# Patient Record
Sex: Female | Born: 1945 | Race: Black or African American | Hispanic: No | Marital: Single | Smoking: Former smoker
Health system: Southern US, Community
[De-identification: ages and names within clinical notes are randomized; demographics above are authoritative.]

## PROBLEM LIST (undated history)

## (undated) DIAGNOSIS — N189 Chronic kidney disease, unspecified: Secondary | ICD-10-CM

## (undated) DIAGNOSIS — E119 Type 2 diabetes mellitus without complications: Secondary | ICD-10-CM

## (undated) DIAGNOSIS — E785 Hyperlipidemia, unspecified: Secondary | ICD-10-CM

## (undated) DIAGNOSIS — I10 Essential (primary) hypertension: Secondary | ICD-10-CM

## (undated) HISTORY — DX: Chronic kidney disease, unspecified: N18.9

## (undated) HISTORY — PX: EYE SURGERY: SHX253

## (undated) HISTORY — PX: TUBAL LIGATION: SHX77

---

## 2009-10-26 HISTORY — PX: BREAST CYST EXCISION: SHX579

## 2012-08-16 ENCOUNTER — Ambulatory Visit: Payer: Self-pay | Admitting: Ophthalmology

## 2012-08-24 ENCOUNTER — Ambulatory Visit: Payer: Self-pay | Admitting: Ophthalmology

## 2013-10-04 ENCOUNTER — Ambulatory Visit: Payer: Self-pay | Admitting: Ophthalmology

## 2015-02-12 NOTE — Op Note (Signed)
PATIENT NAME:  Amy [name], Amy [name] MR#:  [id number] DATE OF BIRTH:  Nov 17, 1945  DATE OF PROCEDURE:  08/24/2012  PROCEDURES PERFORMED:  1. Pars plana vitrectomy of the right eye.  2. Endolaser, right eye.  3. Membrane peel, right eye.  4. Cataract extraction and intraocular lens insertion, right eye.   PREOPERATIVE DIAGNOSES:  1. Dense vitreous hemorrhage.  2. Diabetes.  3. 3+ visually significant cataract.   POSTOPERATIVE DIAGNOSES:  1. Dense vitreous hemorrhage.  2. Diabetes.  3. 3+ visually significant cataract.  4. Epiretinal membrane.  5. Retinal edema.   ANESTHESIA: Retrobulbar block of the right eye with monitored anesthesia care.   COMPLICATIONS: None.   ESTIMATED BLOOD LOSS: Less than 1 mL.   PRIMARY SURGEON: Teresa Pelton. Sirinity Outland, MD  INDICATIONS FOR PROCEDURE: This is a patient who presented to my office with sudden loss of vision in the right eye. Examination revealed a dense brown visually significant cataract anteriorly and vitreous hemorrhaging with no view of the posterior pole. Risks, benefits, and alternatives of the above procedure were discussed and the patient wished to proceed.   DETAILS OF PROCEDURE: After informed consent was obtained, the patient was brought to the operative suite at [facility]. Patient was placed in supine position, was given a small dose of propofol and a retrobulbar block was performed on the right eye by the primary surgeon without any complications. The right eye was prepped and draped in sterile manner. After lid speculum was inserted, a side-port wound was created at approximately 10:30 in a clear corneal fashion. DisCoVisc was injected into the anterior chamber then a main corneal wound was created at 12:00. Vision blue was then used to paint the anterior capsule and then rinsed using BSS. DisCoVisc was reinjected into the anterior chamber and a continuous 360 degree anterior capsulorrhexis was created without  complication. The lens was hydrodissected using BSS on a 26 gauge cannula. Lens was rotated for 180 degrees. The lens was then cracked in half. It was unable to be cracked into four more pieces. Each half was put on suction and slowly delivered into the anterior chamber and removed in a safe fashion. Once this was completed cortical material was removed using INA. The posterior capsule was polished. The capsular bag was inflated using DisCoVisc. A Technis ZCB00 22.5-diopter lens, serial [id number], was injected into the capsular bag and rotated into position without any complications. The DisCoVisc was removed using INA. A 10-0 nylon stitch was placed at the main corneal wound and the knot was rotated into the cornea. The side-port wound was hydrated and the wounds were noted to be watertight. Attention was then turned to the pars plana vitrectomy portion of the case.   A 23-gauge trocar was placed 3 mm beyond the limbus in the inferotemporal quadrant through displaced conjunctiva in an oblique fashion. The infusion cannula was turned on and inserted through the trocar and secured in position with Steri-Strips. Two more trocars were placed in a similar fashion superotemporally and superonasally. Vitreous cutter and light pipe were introduced in the eye and a core vitrectomy was performed. The posterior vitreous was noted to already be elevated off of the posterior pole. The vitreous was then trimmed for 360 degrees. An  epiretinal membrane associated with proliferative membranes was noted posteriorly. The epiretinal membrane was elevated and peeled off of the macula thus isolating each of the proliferative membranes. These were removed as much as possible remaining as safe as possible. Any bleeding proliferative stubs  were gently applied to endocautery to stop any bleeding without any breaks in the retina. Endolaser was introduced and complete panretinal photocoagulation was performed for 360 degrees from the  temporal arcades out to the ora serrata. Scleral depression was performed for 360 degrees and no breaks or tears could be identified. Inferior blood in the vitreous base was removed using scleral direct visualization and scleral depression. Once this was completed an air-fluid exchange was performed. The trocars were removed and two of the sclerotomies were closed using transconjunctival 6-0 plain gut. Anterior chamber was noted to be well formed and pressure in the eye was confirmed to be approximately 15 mmHg. 5 mg of dexamethasone was given into the inferior fornix and the lid speculum was removed. The eye was cleaned and TobraDex was placed on the eye.    A patch and shield were placed over the eye and the patient was taken to postanesthesia care with instructions to remain head up.   ____________________________ Teresa Pelton. Creta Dorame, MD mfa:cms D: 08/24/2012 08:56:57 ET T: 08/24/2012 10:09:03 ET JOB#: [id number]  cc: Teresa Pelton. Starling Manns, MD, <Dictator> Coralee Rud MD ELECTRONICALLY SIGNED 09/28/2012 6:55

## 2015-02-15 NOTE — Op Note (Signed)
PATIENT NAME:  Amy [name], EVALYN EIGSTI MR#:  [id number] DATE OF BIRTH:  10-07-1946  DATE OF PROCEDURE:  10/04/2013  PROCEDURES PERFORMED:  1.  Pars plana vitrectomy of the left eye.  2.  Epiretinal membrane peel of the left eye.  3.  Panretinal photocoagulation of the left eye.  4.  Phacoemulsification with intraocular lens insertion of the left eye.   PREOPERATIVE DIAGNOSES:  1.  Epiretinal membrane.  2.  Retinal edema.  3.  Proliferative diabetic retinopathy.  4.  Vitreous hemorrhage.  5.  Visually significant cataract of the left eye.   POSTOPERATIVE DIAGNOSES: 1.  Epiretinal membrane.  2.  Retinal edema.  3.  Proliferative diabetic retinopathy.  4.  Vitreous hemorrhage.  5.  Visually significant cataract of the left eye.   ESTIMATED BLOOD LOSS: Less than 1 mL.  PRIMARY SURGEON: Coralee Rud, M.D.   ANESTHESIA: Retrobulbar block of the left eye with monitored sedation care.   INDICATIONS FOR PROCEDURE: The patient presented to my office with decreasing vision in the left eye. The patient noted that she has significant trouble with reading and watching television. Visual acuity was 2400 in the left eye. Examination revealed an epiretinal membrane associated with proliferative diabetic retinopathy and retinal edema. A visually significant cataract was also noted as well as vitreous hemorrhage inferiorly. Risks, benefits, and alternatives of the above procedure were discussed and the patient wished to proceed.   DETAILS OF PROCEDURE: After informed consent was obtained, the patient was brought into the operative suite at [facility]. The patient was placed in supine position, given a small dose of Alfenta and a retrobulbar block was performed on the left eye by the primary surgeon without any complications. The left eye was prepped and draped in sterile manner. After lid speculum was inserted, a side-port wound was created at approximately 10:30 in a clear  corneal fashion. DisCoVisc was injected into the anterior chamber. A main corneal wound was created at 12-o'clock. A cystotome was introduced and a continuous 360 degree anterior capsulorrhexis was created. The lens was hydrodissected using BSS on a 26-gauge cannula. The lens was broken into 3 pieces and removed. A small posterior capsular hole was noted. DisCoVisc was injected into the capsular bag and the cortical material was carefully removed. No vitreous was noted to come around. DisCoVisc was used to re-inflate the bag and a 22.5-diopter Tecnis, ZCB00 lens, serial [id number] was injected into the capsular bag and rotated into position. The anterior DisCoVisc was removed and a 10-0 stitch was placed at the main corneal wound and tied. The knot was rotated into the cornea and the side-port wound was hydrated. The wounds were noted to be watertight.  A 25-gauge trocar was placed inferotemporally through displaced conjunctiva in an oblique fashion 3 mm beyond the limbus. Infusion cannula was turned on and inserted through the trocar and secured in position with Steri-Strips. Two more trocars were placed in a similar fashion superotemporally and superonasally. The vitreous cutter and light pipe were introduced into the eye and an anterior vitrectomy was performed just behind the hole in the capsular bag in order to ensure that no vitreous was within the bag. A small amount of vitreous strands was noted to be just inside the capsular bag and this quickly removed with the use of the vitrector. The integrity of the bag was noted to be unchanged and attention was turned to the posterior.   The central vitreous was removed. Posterior vitreous was elevated using  suction with care to isolate each of the proliferative membranes to avoid traction on the retina. Once these were isolated from the anterior vitreous and the rest of the vitreous face, the vitreous was removed for 360 degrees out to the ora serrata. A  combination of horizontal scissors, forceps and vitrector were used to isolate and peel away the epiretinal and proliferative membranes on the posterior pole. The macula was noted to visibly unfold after successful peeling. No breaks or tears could be identified. Two small areas of bleeding were cauterized to avoid postoperative bleeding. Panretinal photocoagulation was introduced for 360 degrees. No signs of any breaks, tears, or retinal detachment could be found peripherally on scleral depressed exam for 360 degrees. A partial air-fluid exchange was performed. The trocars were removed and the wounds were noted to be airtight. Pressure in the eye was confirmed to be approximately 15 mmHg. Dexamethasone  5 mg was given into the inferior fornix and the lid speculum was removed. The eye was cleaned and TobraDex was placed in the eye. A patch and shield were placed over the eye and the patient was taken to postanesthesia care with instruction to remain head up.    ____________________________ Teresa Pelton. Starling Manns, MD mfa:aw D: 10/04/2013 08:56:34 ET T: 10/04/2013 09:12:34 ET JOB#: [id number]  cc: Teresa Pelton. Starling Manns, MD, <Dictator> Coralee Rud MD ELECTRONICALLY SIGNED 10/25/2013 7:16

## 2016-01-29 DIAGNOSIS — D638 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: Secondary | ICD-10-CM | POA: Insufficient documentation

## 2016-04-15 DIAGNOSIS — R809 Proteinuria, unspecified: Secondary | ICD-10-CM | POA: Insufficient documentation

## 2016-04-15 DIAGNOSIS — E1140 Type 2 diabetes mellitus with diabetic neuropathy, unspecified: Secondary | ICD-10-CM | POA: Insufficient documentation

## 2016-04-15 DIAGNOSIS — E1129 Type 2 diabetes mellitus with other diabetic kidney complication: Secondary | ICD-10-CM | POA: Insufficient documentation

## 2016-04-15 DIAGNOSIS — R319 Hematuria, unspecified: Secondary | ICD-10-CM | POA: Insufficient documentation

## 2016-06-24 ENCOUNTER — Other Ambulatory Visit: Payer: Self-pay | Admitting: Family Medicine

## 2016-06-24 DIAGNOSIS — Z1231 Encounter for screening mammogram for malignant neoplasm of breast: Secondary | ICD-10-CM

## 2016-07-01 ENCOUNTER — Encounter: Payer: Self-pay | Admitting: Radiology

## 2016-07-01 ENCOUNTER — Ambulatory Visit
Admission: RE | Admit: 2016-07-01 | Discharge: 2016-07-01 | Disposition: A | Discharge status: Home / Self Care | Payer: Medicare HMO | Source: Ambulatory Visit | Attending: Family Medicine | Admitting: Family Medicine

## 2016-07-01 DIAGNOSIS — Z1231 Encounter for screening mammogram for malignant neoplasm of breast: Secondary | ICD-10-CM | POA: Insufficient documentation

## 2016-07-09 ENCOUNTER — Other Ambulatory Visit: Payer: Self-pay | Admitting: Family Medicine

## 2016-07-09 DIAGNOSIS — N6489 Other specified disorders of breast: Secondary | ICD-10-CM

## 2016-07-21 ENCOUNTER — Ambulatory Visit
Admission: RE | Admit: 2016-07-21 | Discharge: 2016-07-21 | Disposition: A | Discharge status: Home / Self Care | Payer: Medicare HMO | Source: Ambulatory Visit | Attending: Family Medicine | Admitting: Family Medicine

## 2016-07-21 DIAGNOSIS — N6489 Other specified disorders of breast: Secondary | ICD-10-CM

## 2017-01-24 ENCOUNTER — Emergency Department
Admission: EM | Admit: 2017-01-24 | Discharge: 2017-01-24 | Disposition: A | Discharge status: Home / Self Care | Payer: Medicare HMO | Attending: Emergency Medicine | Admitting: Emergency Medicine

## 2017-01-24 ENCOUNTER — Emergency Department: Payer: Medicare HMO

## 2017-01-24 ENCOUNTER — Encounter: Payer: Self-pay | Admitting: Emergency Medicine

## 2017-01-24 DIAGNOSIS — I10 Essential (primary) hypertension: Secondary | ICD-10-CM | POA: Insufficient documentation

## 2017-01-24 DIAGNOSIS — E119 Type 2 diabetes mellitus without complications: Secondary | ICD-10-CM | POA: Insufficient documentation

## 2017-01-24 DIAGNOSIS — R51 Headache: Secondary | ICD-10-CM | POA: Insufficient documentation

## 2017-01-24 DIAGNOSIS — R519 Headache, unspecified: Secondary | ICD-10-CM

## 2017-01-24 HISTORY — DX: Essential (primary) hypertension: I10

## 2017-01-24 HISTORY — DX: Type 2 diabetes mellitus without complications: E11.9

## 2017-01-24 MED ORDER — ACETAMINOPHEN 500 MG PO TABS
1000.0000 mg | ORAL_TABLET | Freq: Once | ORAL | Status: AC
  Administered 2017-01-24: 1000 mg via ORAL
  Filled 2017-01-24: qty 2

## 2017-01-24 MED ORDER — METOPROLOL SUCCINATE ER 50 MG PO TB24
50.0000 mg | ORAL_TABLET | Freq: Once | ORAL | Status: AC
  Administered 2017-01-24: 50 mg via ORAL
  Filled 2017-01-24 (×2): qty 1

## 2017-01-24 NOTE — ED Triage Notes (Signed)
Pt reports right sided head pain x3 days, reports pain is in one spot. Pt A/O, neuro intact, grip strength equal, smile symmetrical.

## 2017-01-24 NOTE — Discharge Instructions (Signed)
You have been seen in the Emergency Department (ED) for a headache. Your evaluation today was overall reassuring. Headaches have many possible causes. Most headaches aren't a sign of a more serious problem, and they will get better on their own.   Follow-up with your doctor in 12-24 hours if you are still having a headache. Otherwise follow up with your doctor in 3-5 days.  For pain take 1000mg  tylenol every 8 hours as needed  When should you call for help?  Call 911 or return to the ED anytime you think you may need emergency care. For example, call if:  You have signs of a stroke. These may include:  Sudden numbness, paralysis, or weakness in your face, arm, or leg, especially on only one side of your body.  Sudden vision changes.  Sudden trouble speaking.  Sudden confusion or trouble understanding simple statements.  Sudden problems with walking or balance.  A sudden, severe headache that is different from past headaches. You have new or worsening headache Nausea and vomiting associated with your headache Fever, neck stiffness associated with your headache  Call your doctor now or seek immediate medical care if:  You have a new or worse headache.  Your headache gets much worse.  How can you care for yourself at home?  Do not drive if you have taken a prescription pain medicine.  Rest in a quiet, dark room until your headache is gone. Close your eyes and try to relax or go to sleep. Don't watch TV or read.  Put a cold, moist cloth or cold pack on the painful area for 10 to 20 minutes at a time. Put a thin cloth between the cold pack and your skin.  Use a warm, moist towel or a heating pad set on low to relax tight shoulder and neck muscles.  Have someone gently massage your neck and shoulders.  Take pain medicines exactly as directed.  If the doctor gave you a prescription medicine for pain, take it as prescribed.  If you are not taking a prescription pain medicine, ask your doctor  if you can take an over-the-counter medicine. Be careful not to take pain medicine more often than the instructions allow, because you may get worse or more frequent headaches when the medicine wears off.  Do not ignore new symptoms that occur with a headache, such as a fever, weakness or numbness, vision changes, or confusion. These may be signs of a more serious problem.  To prevent headaches  Keep a headache diary so you can figure out what triggers your headaches. Avoiding triggers may help you prevent headaches. Record when each headache began, how long it lasted, and what the pain was like (throbbing, aching, stabbing, or dull). Write down any other symptoms you had with the headache, such as nausea, flashing lights or dark spots, or sensitivity to bright light or loud noise. Note if the headache occurred near your period. List anything that might have triggered the headache, such as certain foods (chocolate, cheese, wine) or odors, smoke, bright light, stress, or lack of sleep.  Find healthy ways to deal with stress. Headaches are most common during or right after stressful times. Take time to relax before and after you do something that has caused a headache in the past.  Try to keep your muscles relaxed by keeping good posture. Check your jaw, face, neck, and shoulder muscles for tension, and try relaxing them. When sitting at a desk, change positions often, and stretch for  30 seconds each hour.  Get plenty of sleep and exercise.  Eat regularly and well. Long periods without food can trigger a headache.  Treat yourself to a massage. Some people find that regular massages are very helpful in relieving tension.  Limit caffeine by not drinking too much coffee, tea, or soda. But don't quit caffeine suddenly, because that can also give you headaches.  Reduce eyestrain from computers by blinking frequently and looking away from the computer screen every so often. Make sure you have proper eyewear and  that your monitor is set up properly, about an arm's length away.  Seek help if you have depression or anxiety. Your headaches may be linked to these conditions. Treatment can both prevent headaches and help with symptoms of anxiety or depression.

## 2017-01-24 NOTE — ED Provider Notes (Signed)
[facility] Emergency Department Provider Note  ____________________________________________  Time seen: Approximately 7:50 PM  I have reviewed the triage vital signs and the nursing notes.   HISTORY  Chief Complaint Headache   HPI Amy [name] is a 71 y.o. female with a history of diabetes and hypertension who presents for evaluation of headache. Patient reports 2 days of intermittent right-sided headache that she describes as a severe sharp stabbing pain in her right temporal area that lasts a few seconds at a time and resolves without intervention. These episodes have been happening more frequent today and more severe which prompted her visit to the emergency. She has no headache at this time. No changes in vision, no slurred speech, no unilateral weakness or numbness, no difficulty walking, no lightheadedness. No nausea or vomiting, no fever or neck stiffness, no jaw claudication.  Past Medical History:  Diagnosis Date  . Diabetes mellitus without complication ([place])   . Hypertension     There are no active problems to display for this patient.   Past Surgical History:  Procedure Laterality Date  . BREAST CYST EXCISION Right 2011   cyst removed -neg    Prior to Admission medications   Not on File    Allergies Patient has no known allergies.  Family History  Problem Relation Age of Onset  . Breast cancer Neg Hx     Social History Social History  Substance Use Topics  . Smoking status: Not on file  . Smokeless tobacco: Not on file  . Alcohol use Not on file    Review of Systems  Constitutional: Negative for fever. Eyes: Negative for visual changes. ENT: Negative for sore throat. Neck: No neck pain  Cardiovascular: Negative for chest pain. Respiratory: Negative for shortness of breath. Gastrointestinal: Negative for abdominal pain, vomiting or diarrhea. Genitourinary: Negative for dysuria. Musculoskeletal: Negative for back  pain. Skin: Negative for rash. Neurological: Negative for weakness or numbness. + HA Psych: No SI or HI  ____________________________________________   PHYSICAL EXAM:  VITAL SIGNS: ED Triage Vitals [01/24/17 1659]  Enc Vitals Group     BP (!) 159/72     Pulse Rate 77     Resp 16     Temp 98.4 F (36.9 C)     Temp Source Oral     SpO2 100 %     Weight 160 lb (72.6 kg)     Height 5\' 7"  (1.702 m)     Head Circumference      Peak Flow      Pain Score 6     Pain Loc      Pain Edu?      Excl. in [place]?     Constitutional: Alert and oriented. Well appearing and in no apparent distress. HEENT:      Head: Normocephalic and atraumatic.         Eyes: Conjunctivae are normal. Sclera is non-icteric. EOMI. PERRL      Mouth/Throat: Mucous membranes are moist.       Neck: Supple with no signs of meningismus. NO ttp over the temporal artery Cardiovascular: Regular rate and rhythm. No murmurs, gallops, or rubs. 2+ symmetrical distal pulses are present in all extremities. No JVD. Respiratory: Normal respiratory effort. Lungs are clear to auscultation bilaterally. No wheezes, crackles, or rhonchi.  Gastrointestinal: Soft, non tender, and non distended with positive bowel sounds. No rebound or guarding. Genitourinary: No CVA tenderness. Musculoskeletal: Nontender with normal range of motion in all extremities.  No edema, cyanosis, or erythema of extremities. Neurologic: Normal speech and language. A & O x3, PERRL, no nystagmus, CN II-XII intact, motor testing reveals good tone and bulk throughout. There is no evidence of pronator drift or dysmetria. Muscle strength is 5/5 throughout. Deep tendon reflexes are 2+ throughout with downgoing toes. Sensory examination is intact. Gait is normal. Skin: Skin is warm, dry and intact. No rash noted. Psychiatric: Mood and affect are normal. Speech and behavior are normal.  ____________________________________________   LABS (all labs ordered are listed,  but only abnormal results are displayed)  Labs Reviewed - No data to display ____________________________________________  EKG  none  ____________________________________________  RADIOLOGY  Head CT: No evidence of acute intracranial abnormality. 2. Mild chronic small vessel ischemic disease.  ____________________________________________   PROCEDURES  Procedure(s) performed: None Procedures Critical Care performed:  None ____________________________________________   INITIAL IMPRESSION / ASSESSMENT AND PLAN / ED COURSE  71 y.o. female with a history of diabetes and hypertension who presents for evaluation of 2 days of R sided intermittent headache. No headache at this time. Patient is neurologically intact, she has normal vital signs.  Low suspicion for more serious or life threatening etiology of HA based on history and exam. No sudden onset thunderclap HA, onset with exertion, vomiting, focal neurologic deficits, to suggest increased risk of subarachnoid hemorrhage. No fever, neck pain, neck stiffness, or meningismus on exam to suggest meningitis. No fevers, altered mental status, unusual behavior to suggest encephalitis. No focal neurologic deficits by history or exam to suggest central venous thrombosis. No constitutional symptoms including fever, fatigue, weight loss, temporal scalp tenderness, jaw claudication, visual loss, to suggest temporal arteritis. No immunocompromise to suggest increased risk for intracranial infectious disease. No visual changes or findings on ocular exam to suggest acute angle closure glaucoma. No reports of toxic exposures including carbon monoxide or other household members with similar symptoms.  Will get head CT, give tylenol. Patient's BP is slightly elevated in the setting of not taking her medication this evening. We'll give her metoprolol here.  Clinical Course as of Jan 24 2106  Nancy Fetter Jan 24, 2017  2106 CT negative, Patient remains extremely  well appearing, neuro intact with no HA. Will dc home on supportive care and f/u with PCP.  [CV]    Clinical Course User Index [CV] Rudene Re, MD    Pertinent labs & imaging results that were available during my care of the patient were reviewed by me and considered in my medical decision making (see chart for details).    ____________________________________________   FINAL CLINICAL IMPRESSION(S) / ED DIAGNOSES  Final diagnoses:  Acute nonintractable headache, unspecified headache type      NEW MEDICATIONS STARTED DURING THIS VISIT:  New Prescriptions   No medications on file     Note:  This document was prepared using Dragon voice recognition software and may include unintentional dictation errors.    Rudene Re, MD 01/24/17 2107

## 2017-04-11 DIAGNOSIS — N2581 Secondary hyperparathyroidism of renal origin: Secondary | ICD-10-CM | POA: Insufficient documentation

## 2017-11-11 ENCOUNTER — Other Ambulatory Visit: Payer: Self-pay | Admitting: Family Medicine

## 2017-11-11 DIAGNOSIS — Z780 Asymptomatic menopausal state: Secondary | ICD-10-CM

## 2019-07-20 DIAGNOSIS — E872 Acidosis: Secondary | ICD-10-CM | POA: Insufficient documentation

## 2019-07-20 DIAGNOSIS — E8729 Other acidosis: Secondary | ICD-10-CM | POA: Insufficient documentation

## 2019-07-20 DIAGNOSIS — E875 Hyperkalemia: Secondary | ICD-10-CM | POA: Insufficient documentation

## 2019-07-20 DIAGNOSIS — R112 Nausea with vomiting, unspecified: Secondary | ICD-10-CM | POA: Insufficient documentation

## 2019-07-27 HISTORY — PX: INSERTION OF DIALYSIS CATHETER: SHX1324

## 2019-09-19 DIAGNOSIS — R0602 Shortness of breath: Secondary | ICD-10-CM | POA: Insufficient documentation

## 2019-09-19 DIAGNOSIS — D689 Coagulation defect, unspecified: Secondary | ICD-10-CM | POA: Insufficient documentation

## 2019-09-19 DIAGNOSIS — Z992 Dependence on renal dialysis: Secondary | ICD-10-CM | POA: Insufficient documentation

## 2019-09-19 DIAGNOSIS — N186 End stage renal disease: Secondary | ICD-10-CM | POA: Insufficient documentation

## 2019-09-22 DIAGNOSIS — D509 Iron deficiency anemia, unspecified: Secondary | ICD-10-CM | POA: Insufficient documentation

## 2019-09-25 DIAGNOSIS — E441 Mild protein-calorie malnutrition: Secondary | ICD-10-CM | POA: Insufficient documentation

## 2019-11-27 DIAGNOSIS — T7840XA Allergy, unspecified, initial encounter: Secondary | ICD-10-CM | POA: Insufficient documentation

## 2019-11-27 DIAGNOSIS — T782XXA Anaphylactic shock, unspecified, initial encounter: Secondary | ICD-10-CM | POA: Insufficient documentation

## 2019-12-07 DIAGNOSIS — R770 Abnormality of albumin: Secondary | ICD-10-CM | POA: Insufficient documentation

## 2020-01-15 ENCOUNTER — Other Ambulatory Visit: Payer: Self-pay

## 2020-01-16 ENCOUNTER — Ambulatory Visit (INDEPENDENT_AMBULATORY_CARE_PROVIDER_SITE_OTHER): Payer: Medicare HMO | Admitting: Family Medicine

## 2020-01-16 ENCOUNTER — Encounter: Payer: Self-pay | Admitting: Family Medicine

## 2020-01-16 VITALS — BP 138/72 | HR 80 | Temp 97.5°F | Ht 65.0 in | Wt 160.0 lb

## 2020-01-16 DIAGNOSIS — G5603 Carpal tunnel syndrome, bilateral upper limbs: Secondary | ICD-10-CM | POA: Diagnosis not present

## 2020-01-16 DIAGNOSIS — N185 Chronic kidney disease, stage 5: Secondary | ICD-10-CM | POA: Diagnosis not present

## 2020-01-16 DIAGNOSIS — Z992 Dependence on renal dialysis: Secondary | ICD-10-CM | POA: Insufficient documentation

## 2020-01-16 DIAGNOSIS — I10 Essential (primary) hypertension: Secondary | ICD-10-CM | POA: Insufficient documentation

## 2020-01-16 DIAGNOSIS — N186 End stage renal disease: Secondary | ICD-10-CM | POA: Insufficient documentation

## 2020-01-16 NOTE — Progress Notes (Signed)
New Patient Office Visit  Subjective:  Patient ID: Amy [name], female    DOB: [birth year]  Age: 74 y.o. MRN: [id number]  CC:  Chief Complaint  Patient presents with  . Establish Care    New patient, c/o hands feel numb x 5 months becoming worse.     HPI Amy [name] presents for establishment of care.  She is on chronic dialysis since November of this past year for stage V chronic kidney disease.  History of diabetes in her past.  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  History of hypertension that appears to be well controlled with her current therapy of metoprolol and amlodipine.  She is here today complaining of paresthesias in both of her hands.  Right is affected more so than the left.  She is right-hand dominant.  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  Fingers can also be stiff.  There is also drawing in her fingers during dialysis.  She denies neck pain or stiffness in her neck.  Numbness in her fingers wakes her up on occasion.   Past Medical History:  Diagnosis Date  . Diabetes mellitus without complication ([place])   . Hypertension     Past Surgical History:  Procedure Laterality Date  . BREAST CYST EXCISION Right 2011   cyst removed -neg    Family History  Problem Relation Age of Onset  . Breast cancer Neg Hx     Social History   Socioeconomic History  . Marital status: Single    Spouse name: Not on file  . Number of children: Not on file  . Years of education: Not on file  . Highest education level: Not on file  Occupational History  . Not on file  Tobacco Use  . Smoking status: Never Smoker  . Smokeless tobacco: Never Used  Substance and Sexual Activity  . Alcohol use: Never  . Drug use: Never  . Sexual activity: Not on file  Other Topics Concern  . Not on file  Social History Narrative  . Not on file   Social Determinants of Health   Financial Resource Strain:   . Difficulty of Paying  Living Expenses:   Food Insecurity:   . Worried About Charity fundraiser in the Last Year:   . Arboriculturist in the Last Year:   Transportation Needs:   . Film/video editor (Medical):   [name] Lack of Transportation (Non-Medical):   Physical Activity:   . Days of Exercise per Week:   . Minutes of Exercise per Session:   Stress:   . Feeling of Stress :   Social Connections:   . Frequency of Communication with Friends and Family:   . Frequency of Social Gatherings with Friends and Family:   . Attends Religious Services:   . Active Member of Clubs or Organizations:   . Attends Archivist Meetings:   [name] Marital Status:   Intimate Partner Violence:   . Fear of Current or Ex-Partner:   . Emotionally Abused:   [name] Physically Abused:   . Sexually Abused:     ROS Review of Systems  Constitutional: Negative.   HENT: Negative.   Eyes: Negative for photophobia and visual disturbance.  Respiratory: Negative.   Cardiovascular: Negative for palpitations.  Musculoskeletal: Positive for arthralgias. Negative for neck pain and neck stiffness.  Neurological: Positive for numbness. Negative for weakness.  Psychiatric/Behavioral: Negative.     Objective:  Today's Vitals: BP 138/72   Pulse 80   Temp (!) 97.5 F (36.4 C) (Tympanic)   Ht 5\' 5"  (1.651 m)   Wt 160 lb (72.6 kg)   SpO2 98%   BMI 26.63 kg/m   Physical Exam Vitals and nursing note reviewed.  Constitutional:      General: She is not in acute distress.    Appearance: Normal appearance. She is normal weight. She is not ill-appearing, toxic-appearing or diaphoretic.  HENT:     Head: Normocephalic and atraumatic.     Right Ear: External ear normal.     Left Ear: External ear normal.  Eyes:     General:        Right eye: No discharge.        Left eye: No discharge.     Conjunctiva/sclera: Conjunctivae normal.  Cardiovascular:     Rate and Rhythm: Normal rate and regular rhythm.  Pulmonary:     Effort:  Pulmonary effort is normal.     Breath sounds: Normal breath sounds.  Musculoskeletal:     Right hand: No swelling, deformity, lacerations, tenderness or bony tenderness. Decreased range of motion. Normal strength.     Left hand: No swelling, deformity, lacerations, tenderness or bony tenderness. Decreased range of motion. Normal strength.       Arms:     Cervical back: Normal. No swelling, deformity, signs of trauma, rigidity, spasms or tenderness. Normal range of motion.  Lymphadenopathy:     Cervical: No cervical adenopathy.  Neurological:     Mental Status: She is alert.     Assessment & Plan:   Problem List Items Addressed This Visit      Cardiovascular and Mediastinum   Essential hypertension   Relevant Medications   amLODipine (NORVASC) 5 MG tablet   metoprolol succinate (TOPROL-XL) 50 MG 24 hr tablet     Nervous and Auditory   Bilateral carpal tunnel syndrome   Relevant Orders   Ambulatory referral to Sports Medicine     Genitourinary   Stage 5 chronic kidney disease not on chronic dialysis [facility]) - Primary      Outpatient Encounter Medications as of 01/16/2020  Medication Sig  . amLODipine (NORVASC) 5 MG tablet Take 1 tablet by mouth daily.  . calcium acetate (PHOSLO) 667 MG capsule Take 1,334 mg by mouth 3 (three) times daily.  . metoprolol succinate (TOPROL-XL) 50 MG 24 hr tablet Take 50 mg by mouth daily.   No facility-administered encounter medications on file as of 01/16/2020.    Follow-up: Return in about 3 months (around 04/17/2020).   [name], MD

## 2020-01-23 ENCOUNTER — Other Ambulatory Visit: Payer: Self-pay

## 2020-01-23 ENCOUNTER — Ambulatory Visit: Payer: Medicare HMO | Admitting: Family Medicine

## 2020-01-23 VITALS — BP 130/82 | Ht 65.0 in | Wt 160.0 lb

## 2020-01-23 DIAGNOSIS — M25562 Pain in left knee: Secondary | ICD-10-CM

## 2020-01-23 DIAGNOSIS — G5603 Carpal tunnel syndrome, bilateral upper limbs: Secondary | ICD-10-CM

## 2020-01-23 DIAGNOSIS — M25561 Pain in right knee: Secondary | ICD-10-CM | POA: Diagnosis not present

## 2020-01-23 DIAGNOSIS — G8929 Other chronic pain: Secondary | ICD-10-CM | POA: Diagnosis not present

## 2020-01-23 NOTE — Patient Instructions (Signed)
You have carpal tunnel syndrome. Wear the wrist brace at nighttime and as often as possible during the day Tylenol 500mg  up to three times a day as needed for pain. Consider topical voltaren gel up to 4 times a day for pain and inflammation. Avoid aleve and ibuprofen. If not improving, will consider nerve conduction studies to assess severity. Follow up with me in 6 weeks.  Your knee pain is due to arthritis. These are the different medications you can take for this: Tylenol 500mg  three times a day for pain. Capsaicin, aspercreme, or biofreeze topically up to four times a day may also help with pain. Some supplements that may help for arthritis: Boswellia extract, curcumin, pycnogenol Cortisone injections are an option if pain is severe. If cortisone injections do not help, there are different types of shots that may help but they take longer to take effect. It's important that you continue to stay active. Straight leg raises, knee extensions 3 sets of 10 once a day (add ankle weight if these become too easy). Consider physical therapy to strengthen muscles around the joint that hurts to take pressure off of the joint itself. Shoe inserts with good arch support may be helpful. Heat or ice 15 minutes at a time 3-4 times a day as needed to help with pain. Water aerobics and cycling with low resistance are the best two types of exercise for arthritis though any exercise is ok as long as it doesn't worsen the pain. Follow up with me in 6 weeks.

## 2020-01-23 NOTE — Progress Notes (Signed)
PCP and consultation requested by: [name], MD  Subjective:   HPI: Patient is a 74 y.o. female with PMH significant for CKD stage 5 on dialysis for 5 months here for bilateral hand numbness/pain and bilateral knee pain.  She has a 6 mo hx of intermittent bilateral hand numbness/pain during the day and at night, while sleeping on her side. Sx are present in all digits of both hands and says that she notices it most when sleeping on her right shoulder and has to adjust her position. She denies weakness or difficulty performing daily tasks. She denies neck pain or tightness, denies hx of cervical radiculopathy.  She also complains of 8 mo hx of bilateral knee pain and leg weakness that is evident when climbing stairs. The pain is diffuse throughout both knees. She endorses difficulty with standing off the toilet and getting in/out of the tub. She has noticed cracking in her knees while climbing stairs. She denies falling, or feeling unsteady in the knee joints.  Of note, she does receive Iron and Vit D with dialysis.  Past Medical History:  Diagnosis Date  . Diabetes mellitus without complication ([place])   . Hypertension     Current Outpatient Medications on File Prior to Visit  Medication Sig Dispense Refill  . amLODipine (NORVASC) 5 MG tablet Take 1 tablet by mouth daily.    . calcium acetate (PHOSLO) 667 MG capsule Take 1,334 mg by mouth 3 (three) times daily.    . metoprolol succinate (TOPROL-XL) 50 MG 24 hr tablet Take 50 mg by mouth daily.     No current facility-administered medications on file prior to visit.    Past Surgical History:  Procedure Laterality Date  . BREAST CYST EXCISION Right 2011   cyst removed -neg    No Known Allergies  Social History   Socioeconomic History  . Marital status: Single    Spouse name: Not on file  . Number of children: Not on file  . Years of education: Not on file  . Highest education level: Not on file  Occupational  History  . Not on file  Tobacco Use  . Smoking status: Never Smoker  . Smokeless tobacco: Never Used  Substance and Sexual Activity  . Alcohol use: Never  . Drug use: Never  . Sexual activity: Not on file  Other Topics Concern  . Not on file  Social History Narrative  . Not on file   Social Determinants of Health   Financial Resource Strain:   . Difficulty of Paying Living Expenses:   Food Insecurity:   . Worried About Charity fundraiser in the Last Year:   . Arboriculturist in the Last Year:   Transportation Needs:   . Film/video editor (Medical):   [name] Lack of Transportation (Non-Medical):   Physical Activity:   . Days of Exercise per Week:   . Minutes of Exercise per Session:   Stress:   . Feeling of Stress :   Social Connections:   . Frequency of Communication with Friends and Family:   . Frequency of Social Gatherings with Friends and Family:   . Attends Religious Services:   . Active Member of Clubs or Organizations:   . Attends Archivist Meetings:   [name] Marital Status:   Intimate Partner Violence:   . Fear of Current or Ex-Partner:   . Emotionally Abused:   [name] Physically Abused:   . Sexually Abused:     Family  History  Problem Relation Age of Onset  . Breast cancer Neg Hx     BP 130/82   Ht 5\' 5"  (1.651 m)   Wt 160 lb (72.6 kg)   BMI 26.63 kg/m   Review of Systems: See HPI above.     Objective:  Physical Exam:  Gen: NAD, comfortable in exam room  Right hand/wrist: No masses or effusion, no discoloration. FROM. No TTP. Strength:  Wrist flexion 5/5 Wrist extension 5/5 Finger abduction 5/5 Grip strength 5/5 Tinel sign: positive Phalen's Test: Positive Palpation of cubital tunnel: negative  Left hand/wrist: No masses or effusion, no discoloration. FROM. No TTP. Strength:  Wrist flexion 5/5 Wrist extension 5/5 Finger adduction 5/5 Grip strength 5/5 Tinel sign: positive Phalen's Test: Positive Palpation of cubital tunnel:  negative  Right knee: No masses or effusion, no discoloration. FROM. No TTP. Strength: Flexion 5/5 Extension 5/5 Valgus/Varus stress: Neg Ant drawer/Lachman: Neg Post drawer: Neg McMurray: Neg  Left knee: No masses or effusion, no discoloration. FROM. No TTP. Strength: Flexion 5/5 Extension 5/5 Valgus/Varus stress: Neg Ant drawer/Lachman: Neg Post drawer: Neg McMurray: Neg  U/S: Right median nerve area: 0.12 cm2, Left median nerve area: 0.13 cm2    Assessment & Plan:  1. Bilateral Carpal tunnel syndrome: Increased median nerve area on ultrasound, Positive Tinel's/Phalens tests with symptoms, although the full finger distribution is unusual, cervical radiculopathy is unlikely with her exam. - Cock up wrist braces nightly  - Apply voltaren gel to wrists prn for pain (patient to clear with Kidney doctors) 2. Bilateral knee osteoarthritis - Quadricep/hip flexor strengthening

## 2020-01-24 ENCOUNTER — Encounter: Payer: Self-pay | Admitting: Family Medicine

## 2020-03-06 ENCOUNTER — Ambulatory Visit: Payer: Medicare HMO | Admitting: Family Medicine

## 2020-04-18 ENCOUNTER — Ambulatory Visit: Payer: Medicare HMO | Admitting: Family Medicine

## 2020-05-01 DIAGNOSIS — E872 Acidosis, unspecified: Secondary | ICD-10-CM | POA: Insufficient documentation

## 2020-05-01 DIAGNOSIS — Z4932 Encounter for adequacy testing for peritoneal dialysis: Secondary | ICD-10-CM | POA: Insufficient documentation

## 2020-05-01 DIAGNOSIS — E878 Other disorders of electrolyte and fluid balance, not elsewhere classified: Secondary | ICD-10-CM | POA: Insufficient documentation

## 2020-05-01 DIAGNOSIS — Z79899 Other long term (current) drug therapy: Secondary | ICD-10-CM | POA: Insufficient documentation

## 2020-05-01 DIAGNOSIS — K769 Liver disease, unspecified: Secondary | ICD-10-CM | POA: Insufficient documentation

## 2020-05-01 DIAGNOSIS — N2589 Other disorders resulting from impaired renal tubular function: Secondary | ICD-10-CM | POA: Insufficient documentation

## 2020-05-01 DIAGNOSIS — E789 Disorder of lipoprotein metabolism, unspecified: Secondary | ICD-10-CM | POA: Insufficient documentation

## 2020-05-01 DIAGNOSIS — D513 Other dietary vitamin B12 deficiency anemia: Secondary | ICD-10-CM | POA: Insufficient documentation

## 2020-05-02 ENCOUNTER — Ambulatory Visit (INDEPENDENT_AMBULATORY_CARE_PROVIDER_SITE_OTHER): Payer: Medicare HMO

## 2020-05-02 VITALS — Ht 65.0 in | Wt 160.0 lb

## 2020-05-02 DIAGNOSIS — Z0000 Encounter for general adult medical examination without abnormal findings: Secondary | ICD-10-CM

## 2020-05-02 NOTE — Patient Instructions (Signed)
Amy [name] , Thank you for taking time to come for your Medicare Wellness Visit. I appreciate your ongoing commitment to your health goals. Please review the following plan we discussed and let me know if I can assist you in the future.   Screening recommendations/referrals: Colonoscopy: Declined at this time. Please call the office to schedule if you change your mind. Mammogram: Completed in 2020 in [place] per our discussion. Bone Density: Declined at this time. Call our office to schedule if you change your mind. Recommended yearly ophthalmology/optometry visit for glaucoma screening and checkup Recommended yearly dental visit for hygiene and checkup  Vaccinations: Influenza vaccine: Due-06/2020 Pneumococcal vaccine: Due-May receive at next office visit if you decide you want it. Tdap vaccine: Discuss with pharmacy Shingles vaccine: Discuss with pharmacy   Covid-19:Completed  Advanced directives: Discussed. You may pick up information at next office visit if interested.  Conditions/risks identified: See problem list  Next appointment: Follow up in one year for your annual wellness visit    Preventive Care 65 Years and Older, Female Preventive care refers to lifestyle choices and visits with your health care provider that can promote health and wellness. What does preventive care include?  A yearly physical exam. This is also called an annual well check.  Dental exams once or twice a year.  Routine eye exams. Ask your health care provider how often you should have your eyes checked.  Personal lifestyle choices, including:  Daily care of your teeth and gums.  Regular physical activity.  Eating a healthy diet.  Avoiding tobacco and drug use.  Limiting alcohol use.  Practicing safe sex.  Taking low-dose aspirin every day.  Taking vitamin and mineral supplements as recommended by your health care provider. What happens during an annual well check? The services and  screenings done by your health care provider during your annual well check will depend on your age, overall health, lifestyle risk factors, and family history of disease. Counseling  Your health care provider may ask you questions about your:  Alcohol use.  Tobacco use.  Drug use.  Emotional well-being.  Home and relationship well-being.  Sexual activity.  Eating habits.  History of falls.  Memory and ability to understand (cognition).  Work and work Statistician.  Reproductive health. Screening  You may have the following tests or measurements:  Height, weight, and BMI.  Blood pressure.  Lipid and cholesterol levels. These may be checked every 5 years, or more frequently if you are over 26 years old.  Skin check.  Lung cancer screening. You may have this screening every year starting at age 64 if you have a 30-pack-year history of smoking and currently smoke or have quit within the past 15 years.  Fecal occult blood test (FOBT) of the stool. You may have this test every year starting at age 59.  Flexible sigmoidoscopy or colonoscopy. You may have a sigmoidoscopy every 5 years or a colonoscopy every 10 years starting at age 47.  Hepatitis C blood test.  Hepatitis B blood test.  Sexually transmitted disease (STD) testing.  Diabetes screening. This is done by checking your blood sugar (glucose) after you have not eaten for a while (fasting). You may have this done every 1-3 years.  Bone density scan. This is done to screen for osteoporosis. You may have this done starting at age 47.  Mammogram. This may be done every 1-2 years. Talk to your health care provider about how often you should have regular mammograms. Talk with  your health care provider about your test results, treatment options, and if necessary, the need for more tests. Vaccines  Your health care provider may recommend certain vaccines, such as:  Influenza vaccine. This is recommended every  year.  Tetanus, diphtheria, and acellular pertussis (Tdap, Td) vaccine. You may need a Td booster every 10 years.  Zoster vaccine. You may need this after age 4.  Pneumococcal 13-valent conjugate (PCV13) vaccine. One dose is recommended after age 94.  Pneumococcal polysaccharide (PPSV23) vaccine. One dose is recommended after age 1. Talk to your health care provider about which screenings and vaccines you need and how often you need them. This information is not intended to replace advice given to you by your health care provider. Make sure you discuss any questions you have with your health care provider. Document Released: 11/08/2015 Document Revised: 07/01/2016 Document Reviewed: 08/13/2015 Elsevier Interactive Patient Education  2017 [place] Prevention in the Home Falls can cause injuries. They can happen to people of all ages. There are many things you can do to make your home safe and to help prevent falls. What can I do on the outside of my home?  Regularly fix the edges of walkways and driveways and fix any cracks.  Remove anything that might make you trip as you walk through a door, such as a raised step or threshold.  Trim any bushes or trees on the path to your home.  Use bright outdoor lighting.  Clear any walking paths of anything that might make someone trip, such as rocks or tools.  Regularly check to see if handrails are loose or broken. Make sure that both sides of any steps have handrails.  Any raised decks and porches should have guardrails on the edges.  Have any leaves, snow, or ice cleared regularly.  Use sand or salt on walking paths during winter.  Clean up any spills in your garage right away. This includes oil or grease spills. What can I do in the bathroom?  Use night lights.  Install grab bars by the toilet and in the tub and shower. Do not use towel bars as grab bars.  Use non-skid mats or decals in the tub or shower.  If you  need to sit down in the shower, use a plastic, non-slip stool.  Keep the floor dry. Clean up any water that spills on the floor as soon as it happens.  Remove soap buildup in the tub or shower regularly.  Attach bath mats securely with double-sided non-slip rug tape.  Do not have throw rugs and other things on the floor that can make you trip. What can I do in the bedroom?  Use night lights.  Make sure that you have a light by your bed that is easy to reach.  Do not use any sheets or blankets that are too big for your bed. They should not hang down onto the floor.  Have a firm chair that has side arms. You can use this for support while you get dressed.  Do not have throw rugs and other things on the floor that can make you trip. What can I do in the kitchen?  Clean up any spills right away.  Avoid walking on wet floors.  Keep items that you use a lot in easy-to-reach places.  If you need to reach something above you, use a strong step stool that has a grab bar.  Keep electrical cords out of the way.  Do not  use floor polish or wax that makes floors slippery. If you must use wax, use non-skid floor wax.  Do not have throw rugs and other things on the floor that can make you trip. What can I do with my stairs?  Do not leave any items on the stairs.  Make sure that there are handrails on both sides of the stairs and use them. Fix handrails that are broken or loose. Make sure that handrails are as long as the stairways.  Check any carpeting to make sure that it is firmly attached to the stairs. Fix any carpet that is loose or worn.  Avoid having throw rugs at the top or bottom of the stairs. If you do have throw rugs, attach them to the floor with carpet tape.  Make sure that you have a light switch at the top of the stairs and the bottom of the stairs. If you do not have them, ask someone to add them for you. What else can I do to help prevent falls?  Wear shoes  that:  Do not have high heels.  Have rubber bottoms.  Are comfortable and fit you well.  Are closed at the toe. Do not wear sandals.  If you use a stepladder:  Make sure that it is fully opened. Do not climb a closed stepladder.  Make sure that both sides of the stepladder are locked into place.  Ask someone to hold it for you, if possible.  Clearly mark and make sure that you can see:  Any grab bars or handrails.  First and last steps.  Where the edge of each step is.  Use tools that help you move around (mobility aids) if they are needed. These include:  Canes.  Walkers.  Scooters.  Crutches.  Turn on the lights when you go into a dark area. Replace any light bulbs as soon as they burn out.  Set up your furniture so you have a clear path. Avoid moving your furniture around.  If any of your floors are uneven, fix them.  If there are any pets around you, be aware of where they are.  Review your medicines with your doctor. Some medicines can make you feel dizzy. This can increase your chance of falling. Ask your doctor what other things that you can do to help prevent falls. This information is not intended to replace advice given to you by your health care provider. Make sure you discuss any questions you have with your health care provider. Document Released: 08/08/2009 Document Revised: 03/19/2016 Document Reviewed: 11/16/2014 Elsevier Interactive Patient Education  2017 [employer].

## 2020-05-02 NOTE — Progress Notes (Signed)
Subjective:   Amy [name] is a 74 y.o. female who presents for an Initial Medicare Annual Wellness Visit.  I connected with [name] today by telephone and verified that I am speaking with the correct person using two identifiers. Location patient: home Location provider: work Persons participating in the virtual visit: patient, Marine scientist.    I discussed the limitations, risks, security and privacy concerns of performing an evaluation and management service by telephone and the availability of in person appointments. I also discussed with the patient that there may be a patient responsible charge related to this service. The patient expressed understanding and verbally consented to this telephonic visit.    Interactive audio and video telecommunications were attempted between this provider and patient, however failed, due to patient having technical difficulties OR patient did not have access to video capability.  We continued and completed visit with audio only.  Some vital signs may be absent or patient reported.   Time Spent with patient on telephone encounter: 20 minutes  Review of Systems     Cardiac Risk Factors include: advanced age (>23men, >65 women);hypertension;sedentary lifestyle     Objective:    Today's Vitals   05/02/20 1033  Weight: 160 lb (72.6 kg)  Height: 5\' 5"  (1.651 m)   Body mass index is 26.63 kg/m.  Advanced Directives 05/02/2020 01/24/2017  Does Patient Have a Medical Advance Directive? No No  Would patient like information on creating a medical advance directive? No - Patient declined -    Current Medications (verified) Outpatient Encounter Medications as of 05/02/2020  Medication Sig  . metoprolol succinate (TOPROL-XL) 50 MG 24 hr tablet Take 50 mg by mouth daily.  [name] amLODipine (NORVASC) 5 MG tablet Take 1 tablet by mouth daily. (Patient not taking: Reported on 05/02/2020)  . calcium acetate (PHOSLO) 667 MG capsule Take 1,334 mg by mouth 3 (three) times  daily. (Patient not taking: Reported on 05/02/2020)   No facility-administered encounter medications on file as of 05/02/2020.    Allergies (verified) Patient has no known allergies.   History: Past Medical History:  Diagnosis Date  . Diabetes mellitus without complication ([place])   . Hypertension    Past Surgical History:  Procedure Laterality Date  . BREAST CYST EXCISION Right 2011   cyst removed -neg   Family History  Problem Relation Age of Onset  . Breast cancer Neg Hx    Social History   Socioeconomic History  . Marital status: Single    Spouse name: Not on file  . Number of children: Not on file  . Years of education: Not on file  . Highest education level: Not on file  Occupational History  . Not on file  Tobacco Use  . Smoking status: Never Smoker  . Smokeless tobacco: Never Used  Vaping Use  . Vaping Use: Never used  Substance and Sexual Activity  . Alcohol use: Never  . Drug use: Never  . Sexual activity: Not on file  Other Topics Concern  . Not on file  Social History Narrative  . Not on file   Social Determinants of Health   Financial Resource Strain: Low Risk   . Difficulty of Paying Living Expenses: Not hard at all  Food Insecurity: No Food Insecurity  . Worried About Charity fundraiser in the Last Year: Never true  . Ran Out of Food in the Last Year: Never true  Transportation Needs: No Transportation Needs  . Lack of Transportation (Medical): No  .  Lack of Transportation (Non-Medical): No  Physical Activity: Inactive  . Days of Exercise per Week: 0 days  . Minutes of Exercise per Session: 0 min  Stress: No Stress Concern Present  . Feeling of Stress : Not at all  Social Connections: Moderately Integrated  . Frequency of Communication with Friends and Family: More than three times a week  . Frequency of Social Gatherings with Friends and Family: Never  . Attends Religious Services: More than 4 times per year  . Active Member of Clubs or  Organizations: Yes  . Attends Archivist Meetings: More than 4 times per year  . Marital Status: Never married    Tobacco Counseling Counseling given: Not Answered   Clinical Intake:  Pre-visit preparation completed: Yes  Pain : No/denies pain     Nutritional Status: BMI 25 -29 Overweight Nutritional Risks: None  How often do you need to have someone help you when you read instructions, pamphlets, or other written materials from your doctor or pharmacy?: 1 - Never  Diabetic?No  Interpreter Needed?: No  Information entered by :: Caroleen Hamman LPN   Activities of Daily Living In your present state of health, do you have any difficulty performing the following activities: 05/02/2020  Hearing? N  Vision? Y  Difficulty concentrating or making decisions? N  Walking or climbing stairs? N  Dressing or bathing? N  Doing errands, shopping? N  Preparing Food and eating ? N  Using the Toilet? N  In the past six months, have you accidently leaked urine? N  Do you have problems with loss of bowel control? N  Managing your Medications? N  Managing your Finances? N  Housekeeping or managing your Housekeeping? N  Some recent data might be hidden    Patient Care Team: [name], MD as PCP - General (Family Medicine)  Indicate any recent Medical Services you may have received from other than [facility] providers in the past year (date may be approximate).     Assessment:   This is a routine wellness examination for Amy [name].  Hearing/Vision screen  Hearing Screening   125Hz  250Hz  500Hz  1000Hz  2000Hz  3000Hz  4000Hz  6000Hz  8000Hz   Right ear:           Left ear:           Comments: No issues  Vision Screening Comments: Wears glasses- Last eye exam-2020  Dietary issues and exercise activities discussed: Current Exercise Habits: The patient does not participate in regular exercise at present, Exercise limited by: None identified  Goals    . Patient Stated       [name] ike to eat better & walk more      Depression Screen PHQ 2/9 Scores 05/02/2020  PHQ - 2 Score 0    Fall Risk Fall Risk  05/02/2020 01/16/2020  Falls in the past year? 1 0  Number falls in past yr: 1 -  Injury with Fall? 0 -  Follow up Falls prevention discussed -    Any stairs in or around the home? Yes  If so, are there any without handrails? No  Home free of loose throw rugs in walkways, pet beds, electrical cords, etc? Yes  Adequate lighting in your home to reduce risk of falls? Yes   ASSISTIVE DEVICES UTILIZED TO PREVENT FALLS:  Life alert? No  Use of a cane, walker or w/c? No  Grab bars in the bathroom? Yes  Shower chair or bench in shower? No  Elevated toilet seat or  a handicapped toilet? No   TIMED UP AND GO:  Was the test performed? No . Virtual visit    Cognitive Function:No cognitive impairment noted        Immunizations Immunization History  Administered Date(s) Administered  . Janssen (J&J) SARS-COV-2 Vaccination 02/26/2020    TDAP status: Due, Education has been provided regarding the importance of this vaccine. Advised may receive this vaccine at local pharmacy or Health Dept. Aware to provide a copy of the vaccination record if obtained from local pharmacy or Health Dept. Verbalized acceptance and understanding.   Flu Vaccine status: Up to date   Pneumococcal vaccine status: Declined,  Education has been provided regarding the importance of this vaccine but patient still declined. Advised may receive this vaccine at local pharmacy or Health Dept. Aware to provide a copy of the vaccination record if obtained from local pharmacy or Health Dept. Verbalized acceptance and understanding.    Covid-19 vaccine status: Completed vaccines  Qualifies for Shingles Vaccine? Yes   Zostavax completed No   Shingrix Completed?: No.    Education has been provided regarding the importance of this vaccine. Patient has been advised to call insurance company to  determine out of pocket expense if they have not yet received this vaccine. Advised may also receive vaccine at local pharmacy or Health Dept. Verbalized acceptance and understanding.  Screening Tests Health Maintenance  Topic Date Due  . Hepatitis C Screening  Never done  . URINE MICROALBUMIN  Never done  . DEXA SCAN  Never done  . MAMMOGRAM  07/01/2018  . COLONOSCOPY  01/15/2021 (Originally 04/04/1998)  . TETANUS/TDAP  01/15/2021 (Originally 04/05/1967)  . PNA vac Low Risk Adult (1 of 2 - PCV13) 01/15/2021 (Originally 04/04/2013)  . INFLUENZA VACCINE  05/26/2020  . COVID-19 Vaccine  Completed    Health Maintenance  Health Maintenance Due  Topic Date Due  . Hepatitis C Screening  Never done  . URINE MICROALBUMIN  Never done  . DEXA SCAN  Never done  . MAMMOGRAM  07/01/2018    Colorectal Cancer Screening: Patient declined  Mammogram status: Completed 2020. Repeat every year Patient states she had done sometime in 2020 in [place]  Bone Density Status: Patient declined  Lung Cancer Screening: (Low Dose CT Chest recommended if Age 54-80 years, 30 pack-year currently smoking OR have quit w/in 15years.) does not qualify.     Additional Screening:  Hepatitis C Screening: does qualify; Discuss with PCP  Vision Screening: Recommended annual ophthalmology exams for early detection of glaucoma and other disorders of the eye. Is the patient up to date with their annual eye exam?  No  Who is the provider or what is the name of the office in which the patient attends annual eye exams? unsure   Dental Screening: Recommended annual dental exams for proper oral hygiene  Community Resource Referral / Chronic Care Management: CRR required this visit?  No   CCM required this visit?  No      Plan:     I have personally reviewed and noted the following in the patient's chart:   . Medical and social history . Use of alcohol, tobacco or illicit drugs  . Current medications and  supplements . Functional ability and status . Nutritional status . Physical activity . Advanced directives . List of other physicians . Hospitalizations, surgeries, and ER visits in previous 12 months . Vitals . Screenings to include cognitive, depression, and falls . Referrals and appointments  In addition, I have  reviewed and discussed with patient certain preventive protocols, quality metrics, and best practice recommendations. A written personalized care plan for preventive services as well as general preventive health recommendations were provided to patient.  Due to this being a telephonic visit, the after visit summary with patients personalized plan was offered to patient via mail or my-chart.  Per request, patient was mailed a copy of [place], LPN   01/25/2819  Nurse Health Advisor  Nurse Notes: None

## 2020-05-06 ENCOUNTER — Other Ambulatory Visit: Payer: Self-pay

## 2020-05-07 ENCOUNTER — Ambulatory Visit: Payer: Medicare HMO | Admitting: Family Medicine

## 2020-05-08 ENCOUNTER — Other Ambulatory Visit: Payer: Self-pay

## 2020-05-09 ENCOUNTER — Ambulatory Visit (INDEPENDENT_AMBULATORY_CARE_PROVIDER_SITE_OTHER): Payer: Medicare HMO | Admitting: Family Medicine

## 2020-05-09 ENCOUNTER — Encounter: Payer: Self-pay | Admitting: Family Medicine

## 2020-05-09 VITALS — BP 138/76 | HR 79 | Temp 97.0°F | Ht 65.0 in | Wt 164.4 lb

## 2020-05-09 DIAGNOSIS — K5901 Slow transit constipation: Secondary | ICD-10-CM | POA: Diagnosis not present

## 2020-05-09 DIAGNOSIS — I10 Essential (primary) hypertension: Secondary | ICD-10-CM

## 2020-05-09 DIAGNOSIS — K5903 Drug induced constipation: Secondary | ICD-10-CM | POA: Diagnosis not present

## 2020-05-09 MED ORDER — METOPROLOL SUCCINATE ER 50 MG PO TB24: 50.0000 mg | ORAL_TABLET | Freq: Every day | ORAL | 3 refills | Status: DC

## 2020-05-09 NOTE — Progress Notes (Signed)
Established Patient Office Visit  Subjective:  Patient ID: Amy [name], female    DOB: [birth year]  Age: 74 y.o. MRN: [id number]  CC:  Chief Complaint  Patient presents with  . Follow-up    3 month follow up pt has concerns about no BM in 6 days.     HPI [name] presents for follow-up of hypertension and constipation.  Blood pressures been maintained on the metoprolol succinate alone.  Her blood pressures had fallen too low during dialysis and she had become syncopal during that procedure.  Blood pressure remains controlled on metoprolol alone.  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  Peritoneal catheter was placed this past week.  She is going for training tomorrow.  Postoperative pain has required some hydrocodone.  She is having issues with constipation.  Currently taking MiraLAX and Colace without result.  She has been afebrile.  She is fluid restricted to 48 ounces.  She admits that she does not always consume that much fluid.  Past Medical History:  Diagnosis Date  . Diabetes mellitus without complication ([place])   . Hypertension     Past Surgical History:  Procedure Laterality Date  . BREAST CYST EXCISION Right 2011   cyst removed -neg    Family History  Problem Relation Age of Onset  . Breast cancer Neg Hx     Social History   Socioeconomic History  . Marital status: Single    Spouse name: Not on file  . Number of children: Not on file  . Years of education: Not on file  . Highest education level: Not on file  Occupational History  . Not on file  Tobacco Use  . Smoking status: Never Smoker  . Smokeless tobacco: Never Used  Vaping Use  . Vaping Use: Never used  Substance and Sexual Activity  . Alcohol use: Never  . Drug use: Never  . Sexual activity: Not on file  Other Topics Concern  . Not on file  Social History Narrative  . Not on file   Social Determinants of Health   Financial Resource Strain: Low Risk   . Difficulty  of Paying Living Expenses: Not hard at all  Food Insecurity: No Food Insecurity  . Worried About Charity fundraiser in the Last Year: Never true  . Ran Out of Food in the Last Year: Never true  Transportation Needs: No Transportation Needs  . Lack of Transportation (Medical): No  . Lack of Transportation (Non-Medical): No  Physical Activity: Inactive  . Days of Exercise per Week: 0 days  . Minutes of Exercise per Session: 0 min  Stress: No Stress Concern Present  . Feeling of Stress : Not at all  Social Connections: Moderately Integrated  . Frequency of Communication with Friends and Family: More than three times a week  . Frequency of Social Gatherings with Friends and Family: Never  . Attends Religious Services: More than 4 times per year  . Active Member of Clubs or Organizations: Yes  . Attends Archivist Meetings: More than 4 times per year  . Marital Status: Never married  Intimate Partner Violence: Not At Risk  . Fear of Current or Ex-Partner: No  . Emotionally Abused: No  . Physically Abused: No  . Sexually Abused: No    Outpatient Medications Prior to Visit  Medication Sig Dispense Refill  . Docusate Sodium (DSS) 100 MG CAPS Take by mouth.    . ergocalciferol (VITAMIN D2) 1.25 MG (50000  UT) capsule Take by mouth.    [name] gentamicin cream (GARAMYCIN) 0.1 % Apply topically.    [name] HEPARIN SODIUM, BOVINE, IJ Heparin Sodium (Porcine) 1,000 Units/mL Catheter Lock Arterial    . HYDROcodone-acetaminophen (NORCO/VICODIN) 5-325 MG tablet Take 1 tablet by mouth every 6 (six) hours as needed.    . Methoxy PEG-Epoetin Beta (MIRCERA IJ) Mircera    . metoprolol succinate (TOPROL-XL) 50 MG 24 hr tablet Take 50 mg by mouth daily.    . calcium acetate (PHOSLO) 667 MG capsule Take 1,334 mg by mouth 3 (three) times daily. (Patient not taking: Reported on 05/02/2020)    . amLODipine (NORVASC) 5 MG tablet Take 1 tablet by mouth daily. (Patient not taking: Reported on 05/02/2020)     No  facility-administered medications prior to visit.    Allergies  Allergen Reactions  . Sensipar [Cinacalcet]     Other reaction(s): Unknown    ROS Review of Systems  Constitutional: Negative.   HENT: Negative.   Eyes: Negative for photophobia and visual disturbance.  Respiratory: Negative.   Cardiovascular: Negative.   Gastrointestinal: Positive for constipation. Negative for abdominal pain, anal bleeding, blood in stool, nausea and vomiting.  Neurological: Negative for tremors and speech difficulty.  Psychiatric/Behavioral: Negative.       Objective:    Physical Exam Vitals and nursing note reviewed.  Constitutional:      General: She is not in acute distress.    Appearance: Normal appearance. She is not ill-appearing, toxic-appearing or diaphoretic.  HENT:     Head: Normocephalic and atraumatic.     Right Ear: External ear normal.     Left Ear: External ear normal.  Eyes:     General: No scleral icterus.       Right eye: No discharge.        Left eye: No discharge.     Conjunctiva/sclera: Conjunctivae normal.  Cardiovascular:     Rate and Rhythm: Normal rate and regular rhythm.  Pulmonary:     Effort: Pulmonary effort is normal.     Breath sounds: Normal breath sounds.  Abdominal:     General: Bowel sounds are decreased. There is no distension.     Palpations: There is no mass.     Tenderness: There is no abdominal tenderness. There is no guarding or rebound.     Hernia: No hernia is present.    Musculoskeletal:     Cervical back: No rigidity or tenderness.  Lymphadenopathy:     Cervical: No cervical adenopathy.  Skin:    General: Skin is warm and dry.  Neurological:     Mental Status: She is alert and oriented to person, place, and time.  Psychiatric:        Mood and Affect: Mood normal.        Behavior: Behavior normal.     BP 138/76   Pulse 79   Temp (!) 97 F (36.1 C) (Tympanic)   Ht 5\' 5"  (1.651 m)   Wt 164 lb 6.4 oz (74.6 kg)   SpO2 95%    BMI 27.36 kg/m  Wt Readings from Last 3 Encounters:  05/09/20 164 lb 6.4 oz (74.6 kg)  05/02/20 160 lb (72.6 kg)  01/23/20 160 lb (72.6 kg)     Health Maintenance Due  Topic Date Due  . Hepatitis C Screening  Never done  . URINE MICROALBUMIN  Never done  . DEXA SCAN  Never done  . MAMMOGRAM  07/01/2018    There are no  preventive care reminders to display for this patient.  No results found for: TSH No results found for: WBC, HGB, HCT, MCV, PLT Lab Results  Component Value Date   K 4.3 08/16/2012   No results found for: CHOL No results found for: HDL No results found for: LDLCALC No results found for: TRIG No results found for: CHOLHDL No results found for: HGBA1C    Assessment & Plan:   Problem List Items Addressed This Visit      Cardiovascular and Mediastinum   Essential hypertension - Primary   Relevant Medications   HEPARIN SODIUM, BOVINE, IJ   metoprolol succinate (TOPROL-XL) 50 MG 24 hr tablet     Digestive   Slow transit constipation   Drug-induced constipation      Meds ordered this encounter  Medications  . metoprolol succinate (TOPROL-XL) 50 MG 24 hr tablet    Sig: Take 1 tablet (50 mg total) by mouth daily.    Dispense:  90 tablet    Refill:  3    Follow-up: Return in about 6 months (around 11/09/2020), or if symptoms worsen or fail to improve.   Advised her to be sure to consume the 48 ounces of fluid at this point.  May increase the MiraLAX to twice daily.  Take the Colace twice daily.  May try a fleets enema.  Advised her that MiraLAX eventually will work.  Information given on constipation. [name], MD

## 2020-05-09 NOTE — Patient Instructions (Signed)

## 2020-05-22 ENCOUNTER — Other Ambulatory Visit: Payer: Self-pay

## 2020-05-23 ENCOUNTER — Encounter: Payer: Self-pay | Admitting: Nurse Practitioner

## 2020-05-23 ENCOUNTER — Ambulatory Visit (INDEPENDENT_AMBULATORY_CARE_PROVIDER_SITE_OTHER): Payer: Medicare HMO | Admitting: Nurse Practitioner

## 2020-05-23 VITALS — BP 150/78 | HR 93 | Temp 97.3°F | Ht 65.0 in | Wt 160.6 lb

## 2020-05-23 DIAGNOSIS — E1122 Type 2 diabetes mellitus with diabetic chronic kidney disease: Secondary | ICD-10-CM | POA: Diagnosis not present

## 2020-05-23 DIAGNOSIS — Z992 Dependence on renal dialysis: Secondary | ICD-10-CM | POA: Diagnosis not present

## 2020-05-23 DIAGNOSIS — Z794 Long term (current) use of insulin: Secondary | ICD-10-CM

## 2020-05-23 DIAGNOSIS — N186 End stage renal disease: Secondary | ICD-10-CM | POA: Diagnosis not present

## 2020-05-23 LAB — GLUCOSE, POCT (MANUAL RESULT ENTRY): POC Glucose: 167 mg/dl — AB (ref 70–99)

## 2020-05-23 MED ORDER — PEN NEEDLES 31G X 6 MM MISC: 1.0000 "application " | Freq: Every day | 2 refills | Status: DC

## 2020-05-23 MED ORDER — FREESTYLE LIBRE 14 DAY SENSOR MISC: 1.0000 [IU] | 0 refills | Status: DC

## 2020-05-23 MED ORDER — FREESTYLE LIBRE 14 DAY READER DEVI: 1.0000 [IU] | Freq: Once | 0 refills | Status: AC

## 2020-05-23 MED ORDER — BASAGLAR KWIKPEN 100 UNIT/ML ~~LOC~~ SOPN: 10.0000 [IU] | PEN_INJECTOR | Freq: Every day | SUBCUTANEOUS | 5 refills | Status: DC

## 2020-05-23 NOTE — Assessment & Plan Note (Addendum)
Worsening glucose control with PD. Home glucose 250-300 (fasting-before PD) HgbA1c 06/2019: 5.2 HgbA1c on 02/15/2020: 7.6 HgbA1c on 05/16/2020: 9.1 She states DM was controlled without medication prior to PD. Reviewed lab results provided by patient from nephrologist: creat. 7.24, BUN 40.  Check POCT glucose Start basaglar 10units Provided freestyle libre rx F/up with pcp in 2weeks

## 2020-05-23 NOTE — Patient Instructions (Signed)
Start basaglar 10units daily Check glucose BID and record. F/up with pcp in 2weeks.  Hypoglycemia Hypoglycemia is when the sugar (glucose) level in your blood is too low. Signs of low blood sugar may include:  Feeling: ? Hungry. ? Worried or nervous (anxious). ? Sweaty and clammy. ? Confused. ? Dizzy. ? Sleepy. ? Sick to your stomach (nauseous).  Having: ? A fast heartbeat. ? A headache. ? A change in your vision. ? Tingling or no feeling (numbness) around your mouth, lips, or tongue. ? Jerky movements that you cannot control (seizure).  Having trouble with: ? Moving (coordination). ? Sleeping. ? Passing out (fainting). ? Getting upset easily (irritability). Low blood sugar can happen to people who have diabetes and people who do not have diabetes. Low blood sugar can happen quickly, and it can be an emergency. Treating low blood sugar Low blood sugar is often treated by eating or drinking something sugary right away, such as:  Fruit juice, 4-6 oz (120-150 mL).  Regular soda (not diet soda), 4-6 oz (120-150 mL).  Low-fat milk, 4 oz (120 mL).  Several pieces of hard candy.  Sugar or honey, 1 Tbsp (15 mL). Treating low blood sugar if you have diabetes If you can think clearly and swallow safely, follow the 15:15 rule:  Take 15 grams of a fast-acting carb (carbohydrate). Talk with your doctor about how much you should take.  Always keep a source of fast-acting carb with you, such as: ? Sugar tablets (glucose pills). Take 3-4 pills. ? 6-8 pieces of hard candy. ? 4-6 oz (120-150 mL) of fruit juice. ? 4-6 oz (120-150 mL) of regular (not diet) soda. ? 1 Tbsp (15 mL) honey or sugar.  Check your blood sugar 15 minutes after you take the carb.  If your blood sugar is still at or below 70 mg/dL (3.9 mmol/L), take 15 grams of a carb again.  If your blood sugar does not go above 70 mg/dL (3.9 mmol/L) after 3 tries, get help right away.  After your blood sugar goes back  to normal, eat a meal or a snack within 1 hour.  Treating very low blood sugar If your blood sugar is at or below 54 mg/dL (3 mmol/L), you have very low blood sugar (severe hypoglycemia). This may also cause:  Passing out.  Jerky movements you cannot control (seizure).  Losing consciousness (coma). This is an emergency. Do not wait to see if the symptoms will go away. Get medical help right away. Call your local emergency services (911 in the [place] Do not drive yourself to the hospital. If you have very low blood sugar and you cannot eat or drink, you may need a glucagon shot (injection). A family member or friend should learn how to check your blood sugar and how to give you a glucagon shot. Ask your doctor if you need to have a glucagon shot kit at home. Follow these instructions at home: General instructions  Take over-the-counter and prescription medicines only as told by your doctor.  Stay aware of your blood sugar as told by your doctor.  Limit alcohol intake to no more than 1 drink a day for nonpregnant women and 2 drinks a day for men. One drink equals 12 oz of beer (355 mL), 5 oz of wine (148 mL), or 1 oz of hard liquor (44 mL).  Keep all follow-up visits as told by your doctor. This is important. If you have diabetes:   Follow your diabetes care plan as  told by your doctor. Make sure you: ? Know the signs of low blood sugar. ? Take your medicines as told. ? Follow your exercise and meal plan. ? Eat on time. Do not skip meals. ? Check your blood sugar as often as told by your doctor. Always check it before and after exercise. ? Follow your sick day plan when you cannot eat or drink normally. Make this plan ahead of time with your doctor.  Share your diabetes care plan with: ? Your work or school. ? People you live with.  Check your pee (urine) for ketones: ? When you are sick. ? As told by your doctor.  Carry a card or wear jewelry that says you have  diabetes. Contact a doctor if:  You have trouble keeping your blood sugar in your target range.  You have low blood sugar often. Get help right away if:  You still have symptoms after you eat or drink something sugary.  Your blood sugar is at or below 54 mg/dL (3 mmol/L).  You have jerky movements that you cannot control.  You pass out. These symptoms may be an emergency. Do not wait to see if the symptoms will go away. Get medical help right away. Call your local emergency services (911 in the [place] Do not drive yourself to the hospital. Summary  Hypoglycemia happens when the level of sugar (glucose) in your blood is too low.  Low blood sugar can happen to people who have diabetes and people who do not have diabetes. Low blood sugar can happen quickly, and it can be an emergency.  Make sure you know the signs of low blood sugar and know how to treat it.  Always keep a source of sugar (fast-acting carb) with you to treat low blood sugar. This information is not intended to replace advice given to you by your health care provider. Make sure you discuss any questions you have with your health care provider. Document Revised: 02/02/2019 Document Reviewed: 11/15/2015 Elsevier Patient Education  2020 [employer].

## 2020-05-23 NOTE — Progress Notes (Signed)
Subjective:  Patient ID: Amy [name], female    DOB: 11-24-1945  Age: 74 y.o. MRN: [id number]  CC: Follow-up (Pt c/o high blood sugar and A1c. She has brought her recent logs to discuss. )  HPI  Type 2 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, with long-term current use of insulin ([place]) Worsening glucose control with PD. Home glucose 250-300 (fasting-before PD) HgbA1c 06/2019: 5.2 HgbA1c on 02/15/2020: 7.6 HgbA1c on 05/16/2020: 9.1 She states DM was controlled without medication prior to PD. Reviewed lab results provided by patient from nephrologist: creat. 7.24, BUN 40.  Check POCT glucose Start basaglar 10units Provided freestyle libre rx F/up with pcp in 2weeks    Reviewed past Medical, Social and Family history today.  Outpatient Medications Prior to Visit  Medication Sig Dispense Refill  . amLODipine (NORVASC) 5 MG tablet Take by mouth.    . calcium acetate (PHOSLO) 667 MG capsule Take 1,334 mg by mouth 3 (three) times daily.     Mariane Baumgarten Sodium (DSS) 100 MG CAPS Take by mouth.    . ergocalciferol (VITAMIN D2) 1.25 MG (50000 UT) capsule Take by mouth.    [name] gentamicin cream (GARAMYCIN) 0.1 % Apply topically.    [name] HEPARIN SODIUM, BOVINE, IJ Heparin Sodium (Porcine) 1,000 Units/mL Catheter Lock Arterial    . HYDROcodone-acetaminophen (NORCO/VICODIN) 5-325 MG tablet Take 1 tablet by mouth every 6 (six) hours as needed.    . Methoxy PEG-Epoetin Beta (MIRCERA IJ) Mircera    . metoprolol succinate (TOPROL-XL) 50 MG 24 hr tablet Take 1 tablet (50 mg total) by mouth daily. 90 tablet 3  . polyethylene glycol powder (MIRALAX) 17 GM/SCOOP powder Take by mouth.    . calcitRIOL (ROCALTROL) 0.5 MCG capsule Take 0.5 mcg by mouth daily.    [name] lactulose (CHRONULAC) 10 GM/15ML solution SMARTSIG:60 Milliliter(s) By Mouth Every Other Day     No facility-administered medications prior to visit.    ROS See HPI  Objective:  BP (!) 150/78 (BP Location: Left  Arm, Patient Position: Sitting)   Pulse 93   Temp (!) 97.3 F (36.3 C)   Ht 5\' 5"  (1.651 m)   Wt 160 lb 9.6 oz (72.8 kg)   SpO2 95%   BMI 26.73 kg/m   Physical Exam  Assessment & Plan:  This visit occurred during the SARS-CoV-2 public health emergency.  Safety protocols were in place, including screening questions prior to the visit, additional usage of staff PPE, and extensive cleaning of exam room while observing appropriate contact time as indicated for disinfecting solutions.   Stefanie was seen today for follow-up.  Diagnoses and all orders for this visit:  Type 2 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, with long-term current use of insulin (HCC) -     Insulin Glargine (BASAGLAR KWIKPEN) 100 UNIT/ML; Inject 0.1 mLs (10 Units total) into the skin at bedtime. -     Continuous Blood Gluc Sensor (FREESTYLE LIBRE 14 DAY SENSOR) MISC; 1 Units by Does not apply route every 14 (fourteen) days. -     Continuous Blood Gluc Receiver (FREESTYLE LIBRE 14 DAY READER) DEVI; 1 Units by Does not apply route once for 1 dose. -     Insulin Pen Needle (PEN NEEDLES) 31G X 6 MM MISC; 1 application by Does not apply route at bedtime. -     POCT Glucose (CBG)    Problem List Items Addressed This Visit      Endocrine  Type 2 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, with long-term current use of insulin (HCC) - Primary    Worsening glucose control with PD. Home glucose 250-300 (fasting-before PD) HgbA1c 06/2019: 5.2 HgbA1c on 02/15/2020: 7.6 HgbA1c on 05/16/2020: 9.1 She states DM was controlled without medication prior to PD. Reviewed lab results provided by patient from nephrologist: creat. 7.24, BUN 40.  Check POCT glucose Start basaglar 10units Provided freestyle libre rx F/up with pcp in 2weeks       Relevant Medications   Insulin Glargine (BASAGLAR KWIKPEN) 100 UNIT/ML   Continuous Blood Gluc Sensor (FREESTYLE LIBRE 14 DAY SENSOR)  MISC   Continuous Blood Gluc Receiver (FREESTYLE LIBRE 14 DAY READER) DEVI   Insulin Pen Needle (PEN NEEDLES) 31G X 6 MM MISC   Other Relevant Orders   POCT Glucose (CBG) (Completed)      Follow-up: Return in about 2 weeks (around 06/06/2020) for DM with Dr. Ethelene Hal.  Wilfred Lacy, NP

## 2020-05-24 ENCOUNTER — Telehealth: Payer: Self-pay | Admitting: Family Medicine

## 2020-05-24 NOTE — Telephone Encounter (Signed)
Returned patients call no answer LMTCB 

## 2020-05-24 NOTE — Telephone Encounter (Signed)
Patient is calling and wanted to speak to someone regarding medication that was prescribed yesterday, please advise. CB is [phone]

## 2020-05-27 NOTE — Telephone Encounter (Signed)
Pt calling because she has nothing to test her blood sugar with and wants to talk to the nurse about it, please advise

## 2020-05-30 NOTE — Telephone Encounter (Signed)
Patient is calling again to see when someone can call her regarding this message. She still has nothing to check her blood sugar with.

## 2020-05-31 ENCOUNTER — Telehealth: Payer: Self-pay

## 2020-05-31 NOTE — Telephone Encounter (Signed)
Patients insurance will not cover [facility] sensor I have tried all week for the approval with no success. Patients dialysis nurse insist on calling back for PA not sure what else to do. Please advise.

## 2020-06-03 NOTE — Telephone Encounter (Signed)
Spoke with the PA department from [place] option 3. Provided [employer], per [place], [place] do not needed and provider is participate in the program. reference # [id number].   Gave [facility] information above, rep stated its still not going through but she going to recheck again.   Please help follow up--  Member [facility] CPT [phone]

## 2020-06-03 NOTE — Telephone Encounter (Signed)
Spoke with patients son and dialysis nurse regarding PA for [employer]. Informed them that PA was denied and that I was informed that PA was not needed that patient could call the number that was provided that and see if the medical supply facility would be able to supply patient with a machine to check blood sugars. After speaking with the nurse at patients home I was told we needed to call back and have Dx codes changed to see if machine would be approved. No success.

## 2020-06-03 NOTE — Telephone Encounter (Signed)
Unfortunately I am not sure what else can be done

## 2020-06-05 ENCOUNTER — Other Ambulatory Visit: Payer: Self-pay

## 2020-06-06 ENCOUNTER — Encounter: Payer: Self-pay | Admitting: Family Medicine

## 2020-06-06 ENCOUNTER — Ambulatory Visit (INDEPENDENT_AMBULATORY_CARE_PROVIDER_SITE_OTHER): Payer: Medicare HMO | Admitting: Family Medicine

## 2020-06-06 VITALS — BP 124/70 | HR 74 | Temp 97.1°F | Ht 65.0 in | Wt 158.0 lb

## 2020-06-06 DIAGNOSIS — Z992 Dependence on renal dialysis: Secondary | ICD-10-CM

## 2020-06-06 DIAGNOSIS — E1122 Type 2 diabetes mellitus with diabetic chronic kidney disease: Secondary | ICD-10-CM

## 2020-06-06 DIAGNOSIS — N186 End stage renal disease: Secondary | ICD-10-CM | POA: Diagnosis not present

## 2020-06-06 DIAGNOSIS — E1165 Type 2 diabetes mellitus with hyperglycemia: Secondary | ICD-10-CM

## 2020-06-06 DIAGNOSIS — IMO0002 Reserved for concepts with insufficient information to code with codable children: Secondary | ICD-10-CM

## 2020-06-06 NOTE — Progress Notes (Signed)
Established Patient Office Visit  Subjective:  Patient ID: Amy [name], female    DOB: [birth year]  Age: 74 y.o. MRN: [id number]  CC:  Chief Complaint  Patient presents with  . Follow-up    2 week follow up on diabetes, patient has concerns about taking insulin without being able to check sugar levels would like to know if she should continue taking insulin.     HPI Amy [name] presents for follow-up of her blood pressure and diabetes.  Blood pressure is improving on current therapy.  She is nervous about taking insulin and being unable to check her blood sugars.  Prior to dialysis patients diabetes was diet-controlled.  Through review of nephrology notes it is seen that hemoglobin A1c have been increasing.  Last value checked by nephrology at the end of July was 9.2.  Patient was started on 10 units of Lantus at bedtime at her last visit.  She currently has no way of checking her blood sugars.  She will see nephrology next week.  She has not seen a nephrologist to date.  Past Medical History:  Diagnosis Date  . Diabetes mellitus without complication ([place])   . Hypertension     Past Surgical History:  Procedure Laterality Date  . BREAST CYST EXCISION Right 2011   cyst removed -neg    Family History  Problem Relation Age of Onset  . Breast cancer Neg Hx     Social History   Socioeconomic History  . Marital status: Single    Spouse name: Not on file  . Number of children: Not on file  . Years of education: Not on file  . Highest education level: Not on file  Occupational History  . Not on file  Tobacco Use  . Smoking status: Never Smoker  . Smokeless tobacco: Never Used  Vaping Use  . Vaping Use: Never used  Substance and Sexual Activity  . Alcohol use: Never  . Drug use: Never  . Sexual activity: Not on file  Other Topics Concern  . Not on file  Social History Narrative  . Not on file   Social Determinants of Health   Financial Resource Strain: Low  Risk   . Difficulty of Paying Living Expenses: Not hard at all  Food Insecurity: No Food Insecurity  . Worried About Charity fundraiser in the Last Year: Never true  . Ran Out of Food in the Last Year: Never true  Transportation Needs: No Transportation Needs  . Lack of Transportation (Medical): No  . Lack of Transportation (Non-Medical): No  Physical Activity: Inactive  . Days of Exercise per Week: 0 days  . Minutes of Exercise per Session: 0 min  Stress: No Stress Concern Present  . Feeling of Stress : Not at all  Social Connections: Moderately Integrated  . Frequency of Communication with Friends and Family: More than three times a week  . Frequency of Social Gatherings with Friends and Family: Never  . Attends Religious Services: More than 4 times per year  . Active Member of Clubs or Organizations: Yes  . Attends Archivist Meetings: More than 4 times per year  . Marital Status: Never married  Intimate Partner Violence: Not At Risk  . Fear of Current or Ex-Partner: No  . Emotionally Abused: No  . Physically Abused: No  . Sexually Abused: No    Outpatient Medications Prior to Visit  Medication Sig Dispense Refill  . amLODipine (NORVASC) 5 MG tablet Take  by mouth.    . calcitRIOL (ROCALTROL) 0.5 MCG capsule Take 0.5 mcg by mouth daily.     . calcium acetate (PHOSLO) 667 MG capsule Take 1,334 mg by mouth 3 (three) times daily.     Mariane Baumgarten Sodium (DSS) 100 MG CAPS Take by mouth.    . ergocalciferol (VITAMIN D2) 1.25 MG (50000 UT) capsule Take by mouth.    [name] gentamicin cream (GARAMYCIN) 0.1 % Apply topically.    [name] HEPARIN SODIUM, BOVINE, IJ Heparin Sodium (Porcine) 1,000 Units/mL Catheter Lock Arterial    . HYDROcodone-acetaminophen (NORCO/VICODIN) 5-325 MG tablet Take 1 tablet by mouth every 6 (six) hours as needed.    . Insulin Glargine (BASAGLAR KWIKPEN) 100 UNIT/ML Inject 0.1 mLs (10 Units total) into the skin at bedtime. 3 mL 5  . Insulin Pen Needle (PEN  NEEDLES) 31G X 6 MM MISC 1 application by Does not apply route at bedtime. 100 each 2  . lactulose (CHRONULAC) 10 GM/15ML solution SMARTSIG:60 Milliliter(s) By Mouth Every Other Day    . Methoxy PEG-Epoetin Beta (MIRCERA IJ) Mircera    . metoprolol succinate (TOPROL-XL) 50 MG 24 hr tablet Take 1 tablet (50 mg total) by mouth daily. 90 tablet 3  . polyethylene glycol powder (MIRALAX) 17 GM/SCOOP powder Take by mouth.    . Continuous Blood Gluc Sensor (FREESTYLE LIBRE 14 DAY SENSOR) MISC 1 Units by Does not apply route every 14 (fourteen) days. 2 each 0   No facility-administered medications prior to visit.    Allergies  Allergen Reactions  . Sensipar [Cinacalcet]     Other reaction(s): Unknown    ROS Review of Systems  Constitutional: Negative.   Respiratory: Negative.   Cardiovascular: Negative.   Gastrointestinal: Negative.   Neurological: Negative for light-headedness and numbness.  Psychiatric/Behavioral: The patient is nervous/anxious.       Objective:    Physical Exam Vitals and nursing note reviewed.  Constitutional:      General: She is not in acute distress.    Appearance: Normal appearance. She is not ill-appearing, toxic-appearing or diaphoretic.  HENT:     Head: Normocephalic and atraumatic.     Right Ear: External ear normal.     Left Ear: External ear normal.  Eyes:     General: No scleral icterus.       Right eye: No discharge.        Left eye: No discharge.     Conjunctiva/sclera: Conjunctivae normal.  Pulmonary:     Effort: Pulmonary effort is normal.  Skin:    General: Skin is warm and dry.  Neurological:     Mental Status: She is alert and oriented to person, place, and time.  Psychiatric:        Mood and Affect: Mood normal.        Behavior: Behavior normal.     BP 124/70   Pulse 74   Temp (!) 97.1 F (36.2 C) (Tympanic)   Ht 5\' 5"  (1.651 m)   Wt 158 lb (71.7 kg)   SpO2 95%   BMI 26.29 kg/m  Wt Readings from Last 3 Encounters:    06/06/20 158 lb (71.7 kg)  05/23/20 160 lb 9.6 oz (72.8 kg)  05/09/20 164 lb 6.4 oz (74.6 kg)     Health Maintenance Due  Topic Date Due  . HEMOGLOBIN A1C  Never done  . Hepatitis C Screening  Never done  . FOOT EXAM  Never done  . OPHTHALMOLOGY EXAM  Never done  .  URINE MICROALBUMIN  Never done  . DEXA SCAN  Never done  . MAMMOGRAM  07/01/2018  . INFLUENZA VACCINE  05/26/2020    There are no preventive care reminders to display for this patient.  No results found for: TSH No results found for: WBC, HGB, HCT, MCV, PLT Lab Results  Component Value Date   K 4.3 08/16/2012   No results found for: CHOL No results found for: HDL No results found for: LDLCALC No results found for: TRIG No results found for: CHOLHDL No results found for: HGBA1C    Assessment & Plan:   Problem List Items Addressed This Visit    None    Visit Diagnoses    Uncontrolled type 2 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 [facility])    -  Primary   Relevant Orders   Ambulatory referral to diabetic education      No orders of the defined types were placed in this encounter.   Follow-up: Return in about 1 month (around 07/07/2020).   Nephrology will probably assume care for her diabetes.  Went ahead and referred her for diabetic teaching.  She will start following her fasting blood sugars each morning and record.  Follow-up with me in 1 month to ensure continuity of care. [name], MD

## 2020-06-07 ENCOUNTER — Telehealth: Payer: Self-pay | Admitting: Family Medicine

## 2020-06-07 DIAGNOSIS — N186 End stage renal disease: Secondary | ICD-10-CM

## 2020-06-07 DIAGNOSIS — E1122 Type 2 diabetes mellitus with diabetic chronic kidney disease: Secondary | ICD-10-CM

## 2020-06-07 DIAGNOSIS — Z992 Dependence on renal dialysis: Secondary | ICD-10-CM

## 2020-06-07 DIAGNOSIS — Z794 Long term (current) use of insulin: Secondary | ICD-10-CM

## 2020-06-07 NOTE — Telephone Encounter (Signed)
Patient is calling and stated that Dr. Ethelene Hal was supposed to sent a script to the pharmacy so that she will be able to check her blood sugar, please advise. CB is [phone]

## 2020-06-10 MED ORDER — BLOOD GLUCOSE MONITOR KIT: PACK | 0 refills | Status: DC

## 2020-06-10 NOTE — Telephone Encounter (Signed)
Patient is calling and requesting a update for previous message left, please advise. CB [phone].

## 2020-06-10 NOTE — Telephone Encounter (Signed)
Spoke with patient who states that she was told that a Rx for a regular glucometer machine was going to be sent to her pharmacy. Per patient he have not received the machine and calling to see if this could be sent in. Please advise.

## 2020-06-10 NOTE — Telephone Encounter (Signed)
Patient aware that Rx for machine sent in and verified that it was received, should be ready for pick up today.

## 2020-07-03 DIAGNOSIS — R209 Unspecified disturbances of skin sensation: Secondary | ICD-10-CM | POA: Insufficient documentation

## 2020-07-03 DIAGNOSIS — R197 Diarrhea, unspecified: Secondary | ICD-10-CM | POA: Insufficient documentation

## 2020-07-03 DIAGNOSIS — R52 Pain, unspecified: Secondary | ICD-10-CM | POA: Insufficient documentation

## 2020-07-29 ENCOUNTER — Ambulatory Visit: Payer: Medicare HMO | Admitting: Dietician

## 2020-08-13 ENCOUNTER — Other Ambulatory Visit: Payer: Self-pay | Admitting: *Deleted

## 2020-08-13 DIAGNOSIS — N185 Chronic kidney disease, stage 5: Secondary | ICD-10-CM

## 2020-08-29 ENCOUNTER — Ambulatory Visit (INDEPENDENT_AMBULATORY_CARE_PROVIDER_SITE_OTHER)
Admission: RE | Admit: 2020-08-29 | Discharge: 2020-08-29 | Disposition: A | Discharge status: Home / Self Care | Payer: Medicare HMO | Source: Ambulatory Visit | Attending: Vascular Surgery | Admitting: Vascular Surgery

## 2020-08-29 ENCOUNTER — Other Ambulatory Visit: Payer: Self-pay

## 2020-08-29 ENCOUNTER — Encounter: Payer: Self-pay | Admitting: Vascular Surgery

## 2020-08-29 ENCOUNTER — Ambulatory Visit (HOSPITAL_COMMUNITY)
Admission: RE | Admit: 2020-08-29 | Discharge: 2020-08-29 | Disposition: A | Discharge status: Home / Self Care | Payer: Medicare HMO | Source: Ambulatory Visit | Attending: Vascular Surgery | Admitting: Vascular Surgery

## 2020-08-29 ENCOUNTER — Ambulatory Visit: Payer: Medicare HMO | Admitting: Vascular Surgery

## 2020-08-29 VITALS — BP 140/88 | HR 75 | Temp 98.0°F | Resp 20 | Ht 65.0 in | Wt 157.0 lb

## 2020-08-29 DIAGNOSIS — N186 End stage renal disease: Secondary | ICD-10-CM | POA: Diagnosis not present

## 2020-08-29 DIAGNOSIS — Z992 Dependence on renal dialysis: Secondary | ICD-10-CM | POA: Diagnosis not present

## 2020-08-29 DIAGNOSIS — N185 Chronic kidney disease, stage 5: Secondary | ICD-10-CM

## 2020-08-29 NOTE — H&P (View-Only) (Signed)
Referring Physician: Dr. Moshe Cipro  Patient name: Amy [name] MRN: [id number] DOB: 1946/02/06 Sex: female  REASON FOR CONSULT: Hemodialysis access  HPI: Amy [name] is a 74 y.o. female, referred for placement of a long-term hemodialysis access.  The patient has been on hemodialysis for about a year via a catheter.  She apparently was originally going to try peritoneal dialysis but this did not work for her.  She has now had her PD catheter removed.  She is right-handed.  She has baseline numbness and tingling in her fingers on both hands.  Other medical problems include diabetes and hypertension both of which are currently stable.  Past Medical History:  Diagnosis Date  . Chronic kidney disease   . Diabetes mellitus without complication ([place])   . Hypertension    Past Surgical History:  Procedure Laterality Date  . BREAST CYST EXCISION Right 2011   cyst removed -neg    Family History  Problem Relation Age of Onset  . Breast cancer Neg Hx     SOCIAL HISTORY: Social History   Socioeconomic History  . Marital status: Single    Spouse name: Not on file  . Number of children: Not on file  . Years of education: Not on file  . Highest education level: Not on file  Occupational History  . Not on file  Tobacco Use  . Smoking status: Never Smoker  . Smokeless tobacco: Never Used  Vaping Use  . Vaping Use: Never used  Substance and Sexual Activity  . Alcohol use: Never  . Drug use: Never  . Sexual activity: Not on file  Other Topics Concern  . Not on file  Social History Narrative  . Not on file   Social Determinants of Health   Financial Resource Strain: Low Risk   . Difficulty of Paying Living Expenses: Not hard at all  Food Insecurity: No Food Insecurity  . Worried About Charity fundraiser in the Last Year: Never true  . Ran Out of Food in the Last Year: Never true  Transportation Needs: No Transportation Needs  . Lack of Transportation (Medical):  No  . Lack of Transportation (Non-Medical): No  Physical Activity: Inactive  . Days of Exercise per Week: 0 days  . Minutes of Exercise per Session: 0 min  Stress: No Stress Concern Present  . Feeling of Stress : Not at all  Social Connections: Moderately Integrated  . Frequency of Communication with Friends and Family: More than three times a week  . Frequency of Social Gatherings with Friends and Family: Never  . Attends Religious Services: More than 4 times per year  . Active Member of Clubs or Organizations: Yes  . Attends Archivist Meetings: More than 4 times per year  . Marital Status: Never married  Intimate Partner Violence: Not At Risk  . Fear of Current or Ex-Partner: No  . Emotionally Abused: No  . Physically Abused: No  . Sexually Abused: No    Allergies  Allergen Reactions  . Sensipar [Cinacalcet]     Other reaction(s): Unknown    Current Outpatient Medications  Medication Sig Dispense Refill  . amLODipine (NORVASC) 5 MG tablet Take by mouth.    . blood glucose meter kit and supplies KIT Dispense based on patient and insurance preference. Use up to four times daily as directed. (FOR ICD-9 250.00, 250.01). 1 each 0  . calcitRIOL (ROCALTROL) 0.5 MCG capsule Take 0.5 mcg by mouth daily.     [name]  calcium acetate (PHOSLO) 667 MG capsule Take 1,334 mg by mouth 3 (three) times daily.     . Continuous Blood Gluc Sensor (FREESTYLE LIBRE 14 DAY SENSOR) MISC 1 Units by Does not apply route every 14 (fourteen) days. 2 each 0  . Docusate Sodium (DSS) 100 MG CAPS Take by mouth.    . ergocalciferol (VITAMIN D2) 1.25 MG (50000 UT) capsule Take by mouth.    [name] gentamicin cream (GARAMYCIN) 0.1 % Apply topically.    [name] HEPARIN SODIUM, BOVINE, IJ Heparin Sodium (Porcine) 1,000 Units/mL Catheter Lock Arterial    . HYDROcodone-acetaminophen (NORCO/VICODIN) 5-325 MG tablet Take 1 tablet by mouth every 6 (six) hours as needed.    . Insulin Glargine (BASAGLAR KWIKPEN) 100 UNIT/ML  Inject 0.1 mLs (10 Units total) into the skin at bedtime. 3 mL 5  . Insulin Pen Needle (PEN NEEDLES) 31G X 6 MM MISC 1 application by Does not apply route at bedtime. 100 each 2  . lactulose (CHRONULAC) 10 GM/15ML solution SMARTSIG:60 Milliliter(s) By Mouth Every Other Day    . Methoxy PEG-Epoetin Beta (MIRCERA IJ) Mircera    . metoprolol succinate (TOPROL-XL) 50 MG 24 hr tablet Take 1 tablet (50 mg total) by mouth daily. 90 tablet 3  . polyethylene glycol powder (MIRALAX) 17 GM/SCOOP powder Take by mouth.     No current facility-administered medications for this visit.    ROS:   General:  No weight loss, Fever, chills  HEENT: No recent headaches, no nasal bleeding, no visual changes, no sore throat  Neurologic: No dizziness, blackouts, seizures. No recent symptoms of stroke or mini- stroke. No recent episodes of slurred speech, or temporary blindness.  Cardiac: No recent episodes of chest pain/pressure, no shortness of breath at rest.  No shortness of breath with exertion.  Denies history of atrial fibrillation or irregular heartbeat  Vascular: No history of rest pain in feet.  No history of claudication.  No history of non-healing ulcer, No history of DVT   Pulmonary: No home oxygen, no productive cough, no hemoptysis,  No asthma or wheezing  Musculoskeletal:  _0  Arthritis, _1  Low back pain,  _2  Joint pain  Hematologic:No history of hypercoagulable state.  No history of easy bleeding.  No history of anemia  Gastrointestinal: No hematochezia or melena,  No gastroesophageal reflux, no trouble swallowing  Urinary: _3  chronic Kidney disease, _4  on HD - _5  MWF or _6  TTHS, _7  Burning with urination, _8  Frequent urination, _9  Difficulty urinating;   Skin: No rashes  Psychological: No history of anxiety,  No history of depression   Physical Examination  Vitals:   08/29/20 1400  BP: 140/88  Pulse: 75  Resp: 20  Temp: 98 F (36.7 C)  SpO2: 97%  Weight: 157 lb (71.2  kg)  Height: _10  (1.651 m)    Body mass index is 26.13 kg/m.  General:  Alert and oriented, no acute distress HEENT: Normal Neck: No JVD, right side dialysis catheter Cardiac: Regular Rate and Rhythm Skin: No rash Extremity Pulses: 1++ radial, 2+ brachial pulses bilaterally Musculoskeletal: No deformity or edema  Neurologic: Upper and lower extremity motor 5/5 and symmetric  DATA: Patient had upper extremity arterial duplex today which showed less than 2 mm radial artery bilaterally.  Brachial artery was 3 mm on the left 4 mm on the right no obstruction normal anatomic configuration.  Patient also had a vein mapping ultrasound today.  Left basilic vein was quite small.  Right basilic vein was 4-4/6 mm in diameter.  Right cephalic vein was about 2-8/6 mm in diameter about 5 mm in diameter at the antecubital fossa.  Left cephalic vein was less than 2 mm diameter.  ASSESSMENT: Marginal veins for creation of a fistula but she may have an acceptable left upper arm cephalic vein.  Patient needs long-term hemodialysis access.   PLAN: Left brachiocephalic AV fistula versus upper arm AV graft scheduled for member 30th 2021.  Risk benefits possible complications of procedure details were discussed with the patient today including not limited to bleeding infection ischemic steal nonmaturation of fistula graft thrombosis.  She understands and agrees to proceed.   Ruta Hinds, MD [facility] Office: [phone]

## 2020-08-29 NOTE — Progress Notes (Signed)
Referring Physician: Dr. Moshe Cipro  Patient name: Amy [name] MRN: [id number] DOB: 1946/02/06 Sex: female  REASON FOR CONSULT: Hemodialysis access  HPI: Amy [name] is a 74 y.o. female, referred for placement of a long-term hemodialysis access.  The patient has been on hemodialysis for about a year via a catheter.  She apparently was originally going to try peritoneal dialysis but this did not work for her.  She has now had her PD catheter removed.  She is right-handed.  She has baseline numbness and tingling in her fingers on both hands.  Other medical problems include diabetes and hypertension both of which are currently stable.  Past Medical History:  Diagnosis Date  . Chronic kidney disease   . Diabetes mellitus without complication ([place])   . Hypertension    Past Surgical History:  Procedure Laterality Date  . BREAST CYST EXCISION Right 2011   cyst removed -neg    Family History  Problem Relation Age of Onset  . Breast cancer Neg Hx     SOCIAL HISTORY: Social History   Socioeconomic History  . Marital status: Single    Spouse name: Not on file  . Number of children: Not on file  . Years of education: Not on file  . Highest education level: Not on file  Occupational History  . Not on file  Tobacco Use  . Smoking status: Never Smoker  . Smokeless tobacco: Never Used  Vaping Use  . Vaping Use: Never used  Substance and Sexual Activity  . Alcohol use: Never  . Drug use: Never  . Sexual activity: Not on file  Other Topics Concern  . Not on file  Social History Narrative  . Not on file   Social Determinants of Health   Financial Resource Strain: Low Risk   . Difficulty of Paying Living Expenses: Not hard at all  Food Insecurity: No Food Insecurity  . Worried About Charity fundraiser in the Last Year: Never true  . Ran Out of Food in the Last Year: Never true  Transportation Needs: No Transportation Needs  . Lack of Transportation (Medical):  No  . Lack of Transportation (Non-Medical): No  Physical Activity: Inactive  . Days of Exercise per Week: 0 days  . Minutes of Exercise per Session: 0 min  Stress: No Stress Concern Present  . Feeling of Stress : Not at all  Social Connections: Moderately Integrated  . Frequency of Communication with Friends and Family: More than three times a week  . Frequency of Social Gatherings with Friends and Family: Never  . Attends Religious Services: More than 4 times per year  . Active Member of Clubs or Organizations: Yes  . Attends Archivist Meetings: More than 4 times per year  . Marital Status: Never married  Intimate Partner Violence: Not At Risk  . Fear of Current or Ex-Partner: No  . Emotionally Abused: No  . Physically Abused: No  . Sexually Abused: No    Allergies  Allergen Reactions  . Sensipar [Cinacalcet]     Other reaction(s): Unknown    Current Outpatient Medications  Medication Sig Dispense Refill  . amLODipine (NORVASC) 5 MG tablet Take by mouth.    . blood glucose meter kit and supplies KIT Dispense based on patient and insurance preference. Use up to four times daily as directed. (FOR ICD-9 250.00, 250.01). 1 each 0  . calcitRIOL (ROCALTROL) 0.5 MCG capsule Take 0.5 mcg by mouth daily.     [name]  calcium acetate (PHOSLO) 667 MG capsule Take 1,334 mg by mouth 3 (three) times daily.     . Continuous Blood Gluc Sensor (FREESTYLE LIBRE 14 DAY SENSOR) MISC 1 Units by Does not apply route every 14 (fourteen) days. 2 each 0  . Docusate Sodium (DSS) 100 MG CAPS Take by mouth.    . ergocalciferol (VITAMIN D2) 1.25 MG (50000 UT) capsule Take by mouth.    [name] gentamicin cream (GARAMYCIN) 0.1 % Apply topically.    [name] HEPARIN SODIUM, BOVINE, IJ Heparin Sodium (Porcine) 1,000 Units/mL Catheter Lock Arterial    . HYDROcodone-acetaminophen (NORCO/VICODIN) 5-325 MG tablet Take 1 tablet by mouth every 6 (six) hours as needed.    . Insulin Glargine (BASAGLAR KWIKPEN) 100 UNIT/ML  Inject 0.1 mLs (10 Units total) into the skin at bedtime. 3 mL 5  . Insulin Pen Needle (PEN NEEDLES) 31G X 6 MM MISC 1 application by Does not apply route at bedtime. 100 each 2  . lactulose (CHRONULAC) 10 GM/15ML solution SMARTSIG:60 Milliliter(s) By Mouth Every Other Day    . Methoxy PEG-Epoetin Beta (MIRCERA IJ) Mircera    . metoprolol succinate (TOPROL-XL) 50 MG 24 hr tablet Take 1 tablet (50 mg total) by mouth daily. 90 tablet 3  . polyethylene glycol powder (MIRALAX) 17 GM/SCOOP powder Take by mouth.     No current facility-administered medications for this visit.    ROS:   General:  No weight loss, Fever, chills  HEENT: No recent headaches, no nasal bleeding, no visual changes, no sore throat  Neurologic: No dizziness, blackouts, seizures. No recent symptoms of stroke or mini- stroke. No recent episodes of slurred speech, or temporary blindness.  Cardiac: No recent episodes of chest pain/pressure, no shortness of breath at rest.  No shortness of breath with exertion.  Denies history of atrial fibrillation or irregular heartbeat  Vascular: No history of rest pain in feet.  No history of claudication.  No history of non-healing ulcer, No history of DVT   Pulmonary: No home oxygen, no productive cough, no hemoptysis,  No asthma or wheezing  Musculoskeletal:  _0  Arthritis, _1  Low back pain,  _2  Joint pain  Hematologic:No history of hypercoagulable state.  No history of easy bleeding.  No history of anemia  Gastrointestinal: No hematochezia or melena,  No gastroesophageal reflux, no trouble swallowing  Urinary: _3  chronic Kidney disease, _4  on HD - _5  MWF or _6  TTHS, _7  Burning with urination, _8  Frequent urination, _9  Difficulty urinating;   Skin: No rashes  Psychological: No history of anxiety,  No history of depression   Physical Examination  Vitals:   08/29/20 1400  BP: 140/88  Pulse: 75  Resp: 20  Temp: 98 F (36.7 C)  SpO2: 97%  Weight: 157 lb (71.2  kg)  Height: _10  (1.651 m)    Body mass index is 26.13 kg/m.  General:  Alert and oriented, no acute distress HEENT: Normal Neck: No JVD, right side dialysis catheter Cardiac: Regular Rate and Rhythm Skin: No rash Extremity Pulses: 1++ radial, 2+ brachial pulses bilaterally Musculoskeletal: No deformity or edema  Neurologic: Upper and lower extremity motor 5/5 and symmetric  DATA: Patient had upper extremity arterial duplex today which showed less than 2 mm radial artery bilaterally.  Brachial artery was 3 mm on the left 4 mm on the right no obstruction normal anatomic configuration.  Patient also had a vein mapping ultrasound today.  Left basilic vein was quite small.  Right basilic vein was 4-4/6 mm in diameter.  Right cephalic vein was about 2-8/6 mm in diameter about 5 mm in diameter at the antecubital fossa.  Left cephalic vein was less than 2 mm diameter.  ASSESSMENT: Marginal veins for creation of a fistula but she may have an acceptable left upper arm cephalic vein.  Patient needs long-term hemodialysis access.   PLAN: Left brachiocephalic AV fistula versus upper arm AV graft scheduled for member 30th 2021.  Risk benefits possible complications of procedure details were discussed with the patient today including not limited to bleeding infection ischemic steal nonmaturation of fistula graft thrombosis.  She understands and agrees to proceed.   Ruta Hinds, MD [facility] [place] [facility] Office: [phone]

## 2020-09-10 ENCOUNTER — Ambulatory Visit: Payer: Medicare HMO | Admitting: Dietician

## 2020-09-18 ENCOUNTER — Other Ambulatory Visit: Payer: Self-pay

## 2020-09-18 ENCOUNTER — Encounter (HOSPITAL_COMMUNITY): Payer: Self-pay | Admitting: Vascular Surgery

## 2020-09-18 NOTE — Progress Notes (Signed)
PCP - Dr Abelino Derrick Cardiologist - n/a Nephrology - Sherlynn Stalls, ANP  Chest x-ray - n/a EKG - 06/25/20 [facility]- requested tracing Stress Test - n/a ECHO - n/a Cardiac Cath - n/a  Fasting Blood Sugar - 125 Checks Blood Sugar   1 time a day w/14 day [employer]  . If your blood sugar is less than 70 mg/dL, you will need to treat for low blood sugar: o Treat a low blood sugar (less than 70 mg/dL) with  cup of clear juice (cranberry or apple), 4 glucose tablets, OR glucose gel. o Recheck blood sugar in 15 minutes after treatment (to make sure it is greater than 70 mg/dL). If your blood sugar is not greater than 70 mg/dL on recheck, call [phone] for further instructions.  STOP now taking any Aspirin (unless otherwise instructed by your surgeon), Aleve, Naproxen, Ibuprofen, Motrin, Advil, Goody's, BC's, all herbal medications, fish oil, and all vitamins.   Coronavirus Screening Covid test is scheduled on 09/23/20 Do you have any of the following symptoms:  Cough yes/no: No Fever (>100.41F)  yes/no: No Runny nose yes/no: No Sore throat yes/no: No Difficulty breathing/shortness of breath  yes/no: No  Have you traveled in the last 14 days and where? yes/no: No  Patient verbalized understanding of instructions that were given via phone.

## 2020-09-23 ENCOUNTER — Other Ambulatory Visit (HOSPITAL_COMMUNITY)
Admission: RE | Admit: 2020-09-23 | Discharge: 2020-09-23 | Disposition: A | Discharge status: Home / Self Care | Payer: Medicare HMO | Source: Ambulatory Visit | Attending: Vascular Surgery | Admitting: Vascular Surgery

## 2020-09-23 DIAGNOSIS — Z01812 Encounter for preprocedural laboratory examination: Secondary | ICD-10-CM | POA: Insufficient documentation

## 2020-09-23 DIAGNOSIS — Z20822 Contact with and (suspected) exposure to covid-19: Secondary | ICD-10-CM | POA: Insufficient documentation

## 2020-09-23 LAB — SARS CORONAVIRUS 2 (TAT 6-24 HRS): SARS Coronavirus 2: NEGATIVE

## 2020-09-23 NOTE — Anesthesia Preprocedure Evaluation (Addendum)
Anesthesia Evaluation  Patient identified by MRN, date of birth, ID band Patient awake    Reviewed: Patient's Chart, lab work & pertinent test results  Airway Mallampati: II  TM Distance: >3 FB Neck ROM: Full    Dental  (+) Teeth Intact   Pulmonary    Pulmonary exam normal        Cardiovascular hypertension, Pt. on home beta blockers and Pt. on medications  Rhythm:Regular Rate:Normal     Neuro/Psych negative psych ROS   GI/Hepatic negative GI ROS, Neg liver ROS,   Endo/Other  diabetes, Type 2, Insulin Dependent  Renal/GU ESRF and DialysisRenal disease  negative genitourinary   Musculoskeletal negative musculoskeletal ROS (+)   Abdominal (+)  Abdomen: soft. Bowel sounds: normal.  Peds  Hematology negative hematology ROS (+)   Anesthesia Other Findings   Reproductive/Obstetrics                            Anesthesia Physical Anesthesia Plan  ASA: III  Anesthesia Plan: MAC and Regional   Post-op Pain Management:  Regional for Post-op pain   Induction: Intravenous  PONV Risk Score and Plan: 2 and Propofol infusion, Ondansetron and Midazolam  Airway Management Planned: Simple Face Mask, Natural Airway and Nasal Cannula  Additional Equipment: None  Intra-op Plan:   Post-operative Plan:   Informed Consent: I have reviewed the patients History and Physical, chart, labs and discussed the procedure including the risks, benefits and alternatives for the proposed anesthesia with the patient or authorized representative who has indicated his/her understanding and acceptance.     Dental advisory given  Plan Discussed with: CRNA  Anesthesia Plan Comments: (Lab Results      Component                Value               Date                      HGB                      12.6                09/24/2020                HCT                      37.0                09/24/2020           Lab  Results      Component                Value               Date                      NA                       137                 09/24/2020                K                        4.4  09/24/2020                GLUCOSE                  235 (H)             09/24/2020                BUN                      42 (H)              09/24/2020                CREATININE               7.10 (H)            09/24/2020          )       Anesthesia Quick Evaluation

## 2020-09-24 ENCOUNTER — Other Ambulatory Visit: Payer: Self-pay

## 2020-09-24 ENCOUNTER — Ambulatory Visit (HOSPITAL_COMMUNITY): Payer: Medicare HMO | Admitting: Certified Registered"

## 2020-09-24 ENCOUNTER — Ambulatory Visit (HOSPITAL_COMMUNITY)
Admission: RE | Admit: 2020-09-24 | Discharge: 2020-09-24 | Disposition: A | Discharge status: Home / Self Care | Payer: Medicare HMO | Attending: Vascular Surgery | Admitting: Vascular Surgery

## 2020-09-24 ENCOUNTER — Encounter (HOSPITAL_COMMUNITY)
Admission: RE | Disposition: A | Discharge status: Home / Self Care | Payer: Self-pay | Source: Home / Self Care | Attending: Vascular Surgery

## 2020-09-24 ENCOUNTER — Encounter (HOSPITAL_COMMUNITY): Payer: Self-pay | Admitting: Vascular Surgery

## 2020-09-24 DIAGNOSIS — Z794 Long term (current) use of insulin: Secondary | ICD-10-CM | POA: Diagnosis not present

## 2020-09-24 DIAGNOSIS — Z79899 Other long term (current) drug therapy: Secondary | ICD-10-CM | POA: Insufficient documentation

## 2020-09-24 DIAGNOSIS — I1311 Hypertensive heart and chronic kidney disease without heart failure, with stage 5 chronic kidney disease, or end stage renal disease: Secondary | ICD-10-CM | POA: Insufficient documentation

## 2020-09-24 DIAGNOSIS — Z992 Dependence on renal dialysis: Secondary | ICD-10-CM | POA: Insufficient documentation

## 2020-09-24 DIAGNOSIS — N185 Chronic kidney disease, stage 5: Secondary | ICD-10-CM

## 2020-09-24 DIAGNOSIS — N186 End stage renal disease: Secondary | ICD-10-CM | POA: Diagnosis present

## 2020-09-24 DIAGNOSIS — Z888 Allergy status to other drugs, medicaments and biological substances status: Secondary | ICD-10-CM | POA: Diagnosis not present

## 2020-09-24 DIAGNOSIS — E1122 Type 2 diabetes mellitus with diabetic chronic kidney disease: Secondary | ICD-10-CM | POA: Diagnosis not present

## 2020-09-24 HISTORY — DX: Hyperlipidemia, unspecified: E78.5

## 2020-09-24 HISTORY — PX: AV FISTULA PLACEMENT: SHX1204

## 2020-09-24 LAB — POCT I-STAT, CHEM 8
BUN: 42 mg/dL — ABNORMAL HIGH (ref 8–23)
Calcium, Ion: 1.18 mmol/L (ref 1.15–1.40)
Chloride: 98 mmol/L (ref 98–111)
Creatinine, Ser: 7.1 mg/dL — ABNORMAL HIGH (ref 0.44–1.00)
Glucose, Bld: 235 mg/dL — ABNORMAL HIGH (ref 70–99)
HCT: 37 % (ref 36.0–46.0)
Hemoglobin: 12.6 g/dL (ref 12.0–15.0)
Potassium: 4.4 mmol/L (ref 3.5–5.1)
Sodium: 137 mmol/L (ref 135–145)
TCO2: 27 mmol/L (ref 22–32)

## 2020-09-24 LAB — GLUCOSE, CAPILLARY: Glucose-Capillary: 224 mg/dL — ABNORMAL HIGH (ref 70–99)

## 2020-09-24 SURGERY — ARTERIOVENOUS (AV) FISTULA CREATION
Anesthesia: Monitor Anesthesia Care | Laterality: Left

## 2020-09-24 MED ORDER — PROPOFOL 500 MG/50ML IV EMUL
INTRAVENOUS | Status: DC | PRN
  Administered 2020-09-24: 75 ug/kg/min via INTRAVENOUS

## 2020-09-24 MED ORDER — PHENYLEPHRINE HCL (PRESSORS) 10 MG/ML IV SOLN
INTRAVENOUS | Status: DC | PRN
  Administered 2020-09-24 (×3): 80 ug via INTRAVENOUS

## 2020-09-24 MED ORDER — PHENYLEPHRINE 40 MCG/ML (10ML) SYRINGE FOR IV PUSH (FOR BLOOD PRESSURE SUPPORT)
PREFILLED_SYRINGE | INTRAVENOUS | Status: AC
  Filled 2020-09-24: qty 10

## 2020-09-24 MED ORDER — DEXAMETHASONE SODIUM PHOSPHATE 10 MG/ML IJ SOLN
INTRAMUSCULAR | Status: AC
  Filled 2020-09-24: qty 1

## 2020-09-24 MED ORDER — ROPIVACAINE HCL 5 MG/ML IJ SOLN
INTRAMUSCULAR | Status: DC | PRN
  Administered 2020-09-24: 20 mL via PERINEURAL

## 2020-09-24 MED ORDER — LIDOCAINE HCL (PF) 2 % IJ SOLN
INTRAMUSCULAR | Status: AC
  Filled 2020-09-24: qty 5

## 2020-09-24 MED ORDER — SODIUM CHLORIDE 0.9 % IV SOLN: INTRAVENOUS | Status: DC | PRN

## 2020-09-24 MED ORDER — EPHEDRINE 5 MG/ML INJ
INTRAVENOUS | Status: AC
  Filled 2020-09-24: qty 10

## 2020-09-24 MED ORDER — CHLORHEXIDINE GLUCONATE 0.12 % MT SOLN: 15.0000 mL | Freq: Once | OROMUCOSAL | Status: AC

## 2020-09-24 MED ORDER — PROPOFOL 10 MG/ML IV BOLUS
INTRAVENOUS | Status: AC
  Filled 2020-09-24: qty 20

## 2020-09-24 MED ORDER — SODIUM CHLORIDE 0.9 % IV SOLN: INTRAVENOUS | Status: DC

## 2020-09-24 MED ORDER — ONDANSETRON HCL 4 MG/2ML IJ SOLN
INTRAMUSCULAR | Status: AC
  Filled 2020-09-24: qty 2

## 2020-09-24 MED ORDER — SUCCINYLCHOLINE CHLORIDE 200 MG/10ML IV SOSY
PREFILLED_SYRINGE | INTRAVENOUS | Status: AC
  Filled 2020-09-24: qty 10

## 2020-09-24 MED ORDER — CHLORHEXIDINE GLUCONATE 4 % EX LIQD: 60.0000 mL | Freq: Once | CUTANEOUS | Status: DC

## 2020-09-24 MED ORDER — LIDOCAINE HCL (PF) 1 % IJ SOLN
INTRAMUSCULAR | Status: AC
  Filled 2020-09-24: qty 30

## 2020-09-24 MED ORDER — SODIUM CHLORIDE 0.9 % IV SOLN
INTRAVENOUS | Status: AC
  Filled 2020-09-24: qty 1.2

## 2020-09-24 MED ORDER — PROPOFOL 500 MG/50ML IV EMUL
INTRAVENOUS | Status: AC
  Filled 2020-09-24: qty 50

## 2020-09-24 MED ORDER — ACETAMINOPHEN 10 MG/ML IV SOLN: 1000.0000 mg | Freq: Once | INTRAVENOUS | Status: DC | PRN

## 2020-09-24 MED ORDER — ORAL CARE MOUTH RINSE: 15.0000 mL | Freq: Once | OROMUCOSAL | Status: AC

## 2020-09-24 MED ORDER — LACTATED RINGERS IV SOLN: INTRAVENOUS | Status: DC | PRN

## 2020-09-24 MED ORDER — FENTANYL CITRATE (PF) 100 MCG/2ML IJ SOLN
INTRAMUSCULAR | Status: DC | PRN
  Administered 2020-09-24 (×2): 50 ug via INTRAVENOUS

## 2020-09-24 MED ORDER — LIDOCAINE HCL (PF) 1 % IJ SOLN
INTRAMUSCULAR | Status: DC | PRN
  Administered 2020-09-24: 13 mL

## 2020-09-24 MED ORDER — FENTANYL CITRATE (PF) 250 MCG/5ML IJ SOLN
INTRAMUSCULAR | Status: AC
  Filled 2020-09-24: qty 5

## 2020-09-24 MED ORDER — 0.9 % SODIUM CHLORIDE (POUR BTL) OPTIME
TOPICAL | Status: DC | PRN
  Administered 2020-09-24: 1000 mL

## 2020-09-24 MED ORDER — CHLORHEXIDINE GLUCONATE 0.12 % MT SOLN
OROMUCOSAL | Status: AC
  Administered 2020-09-24: 15 mL via OROMUCOSAL
  Filled 2020-09-24: qty 15

## 2020-09-24 MED ORDER — OXYCODONE HCL 5 MG PO TABS
5.0000 mg | ORAL_TABLET | Freq: Four times a day (QID) | ORAL | 0 refills | Status: DC | PRN
Start: 2020-09-24 — End: 2020-12-10

## 2020-09-24 MED ORDER — ONDANSETRON HCL 4 MG/2ML IJ SOLN
INTRAMUSCULAR | Status: DC | PRN
  Administered 2020-09-24: 4 mg via INTRAVENOUS

## 2020-09-24 MED ORDER — PROPOFOL 1000 MG/100ML IV EMUL
INTRAVENOUS | Status: AC
  Filled 2020-09-24: qty 100

## 2020-09-24 MED ORDER — FENTANYL CITRATE (PF) 100 MCG/2ML IJ SOLN: 25.0000 ug | INTRAMUSCULAR | Status: DC | PRN

## 2020-09-24 MED ORDER — PROTAMINE SULFATE 10 MG/ML IV SOLN
INTRAVENOUS | Status: DC | PRN
  Administered 2020-09-24: 70 mg via INTRAVENOUS

## 2020-09-24 MED ORDER — ONDANSETRON HCL 4 MG/2ML IJ SOLN: 4.0000 mg | Freq: Once | INTRAMUSCULAR | Status: DC | PRN

## 2020-09-24 MED ORDER — CEFAZOLIN SODIUM-DEXTROSE 2-4 GM/100ML-% IV SOLN
2.0000 g | INTRAVENOUS | Status: AC
  Administered 2020-09-24: 2 g via INTRAVENOUS
  Filled 2020-09-24: qty 100

## 2020-09-24 MED ORDER — HEPARIN SODIUM (PORCINE) 1000 UNIT/ML IJ SOLN
INTRAMUSCULAR | Status: DC | PRN
  Administered 2020-09-24: 7000 [IU] via INTRAVENOUS

## 2020-09-24 MED ORDER — PHENYLEPHRINE HCL-NACL 10-0.9 MG/250ML-% IV SOLN
INTRAVENOUS | Status: DC | PRN
  Administered 2020-09-24: 25 ug/min via INTRAVENOUS

## 2020-09-24 SURGICAL SUPPLY — 41 items
ARMBAND PINK RESTRICT EXTREMIT (MISCELLANEOUS) ×2 IMPLANT
CANISTER SUCT 3000ML PPV (MISCELLANEOUS) ×2 IMPLANT
CANNULA VESSEL 3MM 2 BLNT TIP (CANNULA) ×2 IMPLANT
CLIP VESOCCLUDE MED 6/CT (CLIP) ×2 IMPLANT
CLIP VESOCCLUDE SM WIDE 6/CT (CLIP) ×2 IMPLANT
COVER PROBE W GEL 5X96 (DRAPES) IMPLANT
COVER WAND RF STERILE (DRAPES) IMPLANT
DECANTER SPIKE VIAL GLASS SM (MISCELLANEOUS) ×2 IMPLANT
DERMABOND ADVANCED (GAUZE/BANDAGES/DRESSINGS) ×1
DERMABOND ADVANCED .7 DNX12 (GAUZE/BANDAGES/DRESSINGS) ×1 IMPLANT
DRAIN PENROSE 1/4X12 LTX STRL (WOUND CARE) ×2 IMPLANT
ELECT REM PT RETURN 9FT ADLT (ELECTROSURGICAL)
ELECTRODE REM PT RTRN 9FT ADLT (ELECTROSURGICAL) IMPLANT
GLOVE BIO SURGEON STRL SZ7.5 (GLOVE) ×2 IMPLANT
GLOVE BIO SURGEON STRL SZ8 (GLOVE) ×4 IMPLANT
GLOVE BIOGEL PI IND STRL 6.5 (GLOVE) ×2 IMPLANT
GLOVE BIOGEL PI INDICATOR 6.5 (GLOVE) ×2
GLOVE SS BIOGEL STRL SZ 6.5 (GLOVE) ×1 IMPLANT
GLOVE SUPERSENSE BIOGEL SZ 6.5 (GLOVE) ×1
GLOVE SURG SS PI 6.5 STRL IVOR (GLOVE) ×2 IMPLANT
GLOVE SURG UNDER POLY LF SZ7 (GLOVE) ×2 IMPLANT
GOWN STRL REUS W/ TWL LRG LVL3 (GOWN DISPOSABLE) ×3 IMPLANT
GOWN STRL REUS W/TWL 2XL LVL3 (GOWN DISPOSABLE) ×4 IMPLANT
GOWN STRL REUS W/TWL LRG LVL3 (GOWN DISPOSABLE) ×3
GRAFT GORETEX STRT 4-7X45 (Vascular Products) ×2 IMPLANT
KIT BASIN OR (CUSTOM PROCEDURE TRAY) ×2 IMPLANT
KIT TURNOVER KIT B (KITS) ×2 IMPLANT
LOOP VESSEL MINI RED (MISCELLANEOUS) ×2 IMPLANT
NS IRRIG 1000ML POUR BTL (IV SOLUTION) ×2 IMPLANT
PACK CV ACCESS (CUSTOM PROCEDURE TRAY) ×2 IMPLANT
PAD ARMBOARD 7.5X6 YLW CONV (MISCELLANEOUS) ×4 IMPLANT
SPONGE SURGIFOAM ABS GEL 100 (HEMOSTASIS) IMPLANT
SUT PROLENE 6 0 CC (SUTURE) ×8 IMPLANT
SUT PROLENE 7 0 BV 1 (SUTURE) IMPLANT
SUT SILK 2 0 PERMA HAND 18 BK (SUTURE) ×2 IMPLANT
SUT VIC AB 3-0 SH 27 (SUTURE) ×2
SUT VIC AB 3-0 SH 27X BRD (SUTURE) ×2 IMPLANT
SUT VICRYL 4-0 PS2 18IN ABS (SUTURE) ×4 IMPLANT
TOWEL GREEN STERILE (TOWEL DISPOSABLE) ×2 IMPLANT
UNDERPAD 30X36 HEAVY ABSORB (UNDERPADS AND DIAPERS) ×2 IMPLANT
WATER STERILE IRR 1000ML POUR (IV SOLUTION) ×2 IMPLANT

## 2020-09-24 NOTE — Interval H&P Note (Signed)
History and Physical Interval Note:  09/24/2020 7:19 AM  Amy [name]  has presented today for surgery, with the diagnosis of ESRD.  The various methods of treatment have been discussed with the patient and family. After consideration of risks, benefits and other options for treatment, the patient has consented to  Procedure(s): LEFT ARTERIOVENOUS (AV) FISTULA CREATION VERSUS ARTERIOVENOUS AV GRAFT (Left) as a surgical intervention.  The patient's history has been reviewed, patient examined, no change in status, stable for surgery.  I have reviewed the patient's chart and labs.  Questions were answered to the patient's satisfaction.     Ruta Hinds

## 2020-09-24 NOTE — Op Note (Addendum)
Procedure: Left upper arm loop graft  Preoperative diagnosis: End-stage renal disease  Postoperative diagnosis: Same  Anesthesia: Left upper extremity block with local  Assistant: Risa Grill PA for expediting procedure and creation of anastomosis  Operative findings: High brachial bifurcation, 4 to 7 mm tapered PTFE graft end to side to brachial artery in the axilla end-to-side to axillary vein  4 mm axillary vein 4 mm brachial artery in the axilla  Operative details: After pain informed consent, the patient was taken the operating.  The patient was placed in supine position operating table.  Patient had had a left upper extremity block placed by the anesthesia team preoperatively.  Tourniquet was placed on the upper arm and the cephalic vein and brachial artery location were marked near the antecubital area.  Local anesthesia was infiltrated in this location.  Transverse incision made location carried down through the subcutaneous tissues down the level of the left cephalic vein.  This was fairly small in caliber.  It was dissected free circumferentially over several centimeters.  It was then transected.  It would except a 2-1/2 mm dilator but not a 3.  At this point I determined that we would abandon attempts to place a fistula.  The vein was ligated with a 2-0 silk tie.  Next the brachial artery was dissected free in the medial portion incision.  This appeared fairly small.  There was also a deeper branch.  This was suggestive of a high brachial bifurcation.  Both these branches were dissected free circumferentially and they were both about 2 mm in diameter.  At this point I abandoned attempts in the antecubital area and local anesthesia was infiltrated in the left axilla.  Longitudinal incision was made in this location.  I do the subcutaneous tissues down the level left axillary vein.  This about 4 mm in diameter.  Several small side branches were ligated and divided tween silk ties.  Next the  brachial artery was dissected free circumferentially deep to this.  I was able to identify the radial ulnar and interosseous branches both emanating from the larger trunk.  The artery was dissected free circumferentially just above this branch point over about 3 cm.  Nerves were protected.  Next a subcutaneous tunnel was created in a loop configuration with the arterial limb of the graft over the biceps muscle and the venous limb over the inner aspect of the upper arm.  A 4 to 7 mm tapered PTFE graft was brought through the subcutaneous tunnel.  Patient was given 7000 units of intravenous heparin.  The artery was controlled proximally and distally with Vesseloops.  Longitudinal opening was made in the artery with a 4 mm end of the PTFE graft was beveled and sewn endograft to side of artery using a running 6-0 Prolene suture.  Prior to completion anastomosis it was for blood backbled and thoroughly flushed anastomosis was secured clamps released there is pulsatile flow in the graft immediately.  Graft was pulled up taut to length for the venous anastomosis.  The vein was controlled proximally distally with fine bulldog clamps.  A longitudinal opening was made in the vein the graft was cut to length and beveled and sewn endograft to side of vein using a running 6-0 Prolene suture.  Despite completion of anastomosis it was for blood backbled and thoroughly flushed reanastomosed was secured clamps released there is palpable thrill in the graft immediately.  There was an audible radial Doppler signal which decreased by about 30 to 40%  with opening the graft.  Patient was given 70 mg of protamine and direct pressure held for hemostasis.  Subcutaneous tissues of both incisions were then closed with running 3-0 Vicryl suture.  Skin of both incisions was closed with a 4-0 Vicryl subcuticular stitch.  Dermabond was applied.  Patient tired procedure well and there were no complications.  Instrument sponge needle counts correct  in the case.  Patient was taken to recovery room in stable condition.  Amy Hinds, MD [facility] Office: [phone]

## 2020-09-24 NOTE — Anesthesia Postprocedure Evaluation (Signed)
Anesthesia Post Note  Patient: Amy [name]  Procedure(s) Performed: LEFT UPPER ARM ARTERIOVENOUS GORTEX  GRAFT (Left )     Patient location during evaluation: PACU Anesthesia Type: Regional and MAC Level of consciousness: awake and alert Pain management: pain level controlled Vital Signs Assessment: post-procedure vital signs reviewed and stable Respiratory status: spontaneous breathing, nonlabored ventilation, respiratory function stable and patient connected to nasal cannula oxygen Cardiovascular status: stable and blood pressure returned to baseline Postop Assessment: no apparent nausea or vomiting Anesthetic complications: no   No complications documented.  Last Vitals:  Vitals:   09/24/20 1038 09/24/20 1049  BP: (!) 101/52 (!) 107/55  Pulse: 68 69  Resp: 19 16  Temp: 36.4 C   SpO2: 100% 100%    Last Pain:  Vitals:   09/24/20 1038  PainSc: 0-No pain                 Belenda Cruise P Autry Prust

## 2020-09-24 NOTE — Anesthesia Procedure Notes (Signed)
Anesthesia Regional Block: Supraclavicular block   Pre-Anesthetic Checklist: ,, timeout performed, Correct Patient, Correct Site, Correct Laterality, Correct Procedure, Correct Position, site marked, Risks and benefits discussed,  Surgical consent,  Pre-op evaluation,  At surgeon's request and post-op pain management  Laterality: Left  Prep: Dura Prep       Needles:  Injection technique: Single-shot  Needle Type: Echogenic Stimulator Needle     Needle Length: 5cm  Needle Gauge: 20     Additional Needles:   Procedures:,,,, ultrasound used (permanent image in chart),,,,  Narrative:  Start time: 09/24/2020 7:10 AM End time: 09/24/2020 7:15 AM Injection made incrementally with aspirations every 5 mL.  Performed by: Personally  Anesthesiologist: Darral Dash, DO  Additional Notes: Patient identified. Risks/Benefits/Options discussed with patient including but not limited to bleeding, infection, nerve damage, failed block, incomplete pain control. Patient expressed understanding and wished to proceed. All questions were answered. Sterile technique was used throughout the entire procedure. Please see nursing notes for vital signs. Aspirated in 5cc intervals with injection for negative confirmation. Patient was given instructions on fall risk and not to get out of bed. All questions and concerns addressed with instructions to call with any issues or inadequate analgesia.

## 2020-09-24 NOTE — Discharge Instructions (Signed)
° °  [facility] [place] ° °Discharge Instructions ° °AV Fistula or Graft Surgery for Dialysis Access ° °Please refer to the following instructions for your post-procedure care. Your surgeon or physician assistant will discuss any changes with you. ° °Activity ° °You may drive the day following your surgery, if you are comfortable and no longer taking prescription pain medication. Resume full activity as the soreness in your incision resolves. ° °Bathing/Showering ° °You may shower after you go home. Keep your incision dry for 48 hours. Do not soak in a bathtub, hot tub, or swim until the incision heals completely. You may not shower if you have a hemodialysis catheter. ° °Incision Care ° °Clean your incision with mild soap and water after 48 hours. Pat the area dry with a clean towel. You do not need a bandage unless otherwise instructed. Do not apply any ointments or creams to your incision. You may have skin glue on your incision. Do not peel it off. It will come off on its own in about one week. Your arm may swell a bit after surgery. To reduce swelling use pillows to elevate your arm so it is above your heart. Your doctor will tell you if you need to lightly wrap your arm with an ACE bandage. ° °Diet ° °Resume your normal diet. There are not special food restrictions following this procedure. In order to heal from your surgery, it is CRITICAL to get adequate nutrition. Your body requires vitamins, minerals, and protein. Vegetables are the best source of vitamins and minerals. Vegetables also provide the perfect balance of protein. Processed food has little nutritional value, so try to avoid this. ° °Medications ° °Resume taking all of your medications. If your incision is causing pain, you may take over-the counter pain relievers such as acetaminophen (Tylenol). If you were prescribed a stronger pain medication, please be aware these medications can cause nausea and constipation. Prevent  nausea by taking the medication with a snack or meal. Avoid constipation by drinking plenty of fluids and eating foods with high amount of fiber, such as fruits, vegetables, and grains. Do not take Tylenol if you are taking prescription pain medications. ° ° ° ° °Follow up °Your surgeon may want to see you in the office following your access surgery. If so, this will be arranged at the time of your surgery. ° °Please call us immediately for any of the following conditions: ° °Increased pain, redness, drainage (pus) from your incision site °Fever of 101 degrees or higher °Severe or worsening pain at your incision site °Hand pain or numbness. ° °Reduce your risk of vascular disease: ° °Stop smoking. If you would like help, call [facility] at 1-800-QUIT-NOW (1-800-784-8669) or [place] at 336-586-4000 ° °Manage your cholesterol °Maintain a desired weight °Control your diabetes °Keep your blood pressure down ° °Dialysis ° °It will take several weeks to several months for your new dialysis access to be ready for use. Your surgeon will determine when it is OK to use it. Your nephrologist will continue to direct your dialysis. You can continue to use your Permcath until your new access is ready for use. ° °If you have any questions, please call the office at [phone]. ° °

## 2020-09-24 NOTE — Transfer of Care (Signed)
Immediate Anesthesia Transfer of Care Note  Patient: Amy [name]  Procedure(s) Performed: LEFT UPPER ARM ARTERIOVENOUS GORTEX  GRAFT (Left )  Patient Location: PACU  Anesthesia Type:MAC  Level of Consciousness: awake, alert  and oriented  Airway & Oxygen Therapy: Patient Spontanous Breathing  Post-op Assessment: Report given to RN and Post -op Vital signs reviewed and stable  Post vital signs: Reviewed and stable  Last Vitals:  Vitals Value Taken Time  BP 109/52 09/24/20 1008  Temp    Pulse 70 09/24/20 1011  Resp 19 09/24/20 1011  SpO2 100 % 09/24/20 1011  Vitals shown include unvalidated device data.  Last Pain:  Vitals:   09/24/20 0639  PainSc: 0-No pain      Patients Stated Pain Goal: 5 (81/85/90 9311)  Complications: No complications documented.

## 2020-09-25 ENCOUNTER — Encounter (HOSPITAL_COMMUNITY): Payer: Self-pay | Admitting: Vascular Surgery

## 2020-10-15 ENCOUNTER — Other Ambulatory Visit: Payer: Self-pay

## 2020-10-15 DIAGNOSIS — N186 End stage renal disease: Secondary | ICD-10-CM

## 2020-10-15 DIAGNOSIS — Z992 Dependence on renal dialysis: Secondary | ICD-10-CM

## 2020-10-31 ENCOUNTER — Ambulatory Visit (INDEPENDENT_AMBULATORY_CARE_PROVIDER_SITE_OTHER): Payer: Self-pay | Admitting: Physician Assistant

## 2020-10-31 ENCOUNTER — Other Ambulatory Visit: Payer: Self-pay

## 2020-10-31 ENCOUNTER — Ambulatory Visit (HOSPITAL_COMMUNITY)
Admission: RE | Admit: 2020-10-31 | Discharge: 2020-10-31 | Disposition: A | Discharge status: Home / Self Care | Payer: Medicare Other | Source: Ambulatory Visit | Attending: Physician Assistant | Admitting: Physician Assistant

## 2020-10-31 VITALS — BP 128/68 | HR 78 | Temp 98.1°F | Resp 18 | Ht 65.0 in | Wt 158.0 lb

## 2020-10-31 DIAGNOSIS — N186 End stage renal disease: Secondary | ICD-10-CM | POA: Diagnosis not present

## 2020-10-31 DIAGNOSIS — Z992 Dependence on renal dialysis: Secondary | ICD-10-CM

## 2020-10-31 NOTE — Progress Notes (Signed)
POST OPERATIVE OFFICE NOTE    CC:  F/u for surgery  HPI:  This is a 75 y.o. female who is s/p left UE AC loop graft on 09/24/20 by Dr. Oneida Alar.   She has been on peritoneal dialysis followed by [facility] placement prior to this.    Pt returns today for follow up.    Allergies  Allergen Reactions  . Sensipar [Cinacalcet] Other (See Comments)    Other reaction(s): Unknown    Current Outpatient Medications  Medication Sig Dispense Refill  . blood glucose meter kit and supplies KIT Dispense based on patient and insurance preference. Use up to four times daily as directed. (FOR ICD-9 250.00, 250.01). 1 each 0  . calcium acetate (PHOSLO) 667 MG capsule Take 1,334 mg by mouth 3 (three) times daily.     . Continuous Blood Gluc Sensor (FREESTYLE LIBRE 14 DAY SENSOR) MISC 1 Units by Does not apply route every 14 (fourteen) days. 2 each 0  . HEPARIN SODIUM, BOVINE, IJ Heparin Sodium (Porcine) 1,000 Units/mL Catheter Lock Arterial    . Insulin Glargine (BASAGLAR KWIKPEN) 100 UNIT/ML Inject 0.1 mLs (10 Units total) into the skin at bedtime. 3 mL 5  . Insulin Pen Needle (PEN NEEDLES) 31G X 6 MM MISC 1 application by Does not apply route at bedtime. 100 each 2  . Methoxy PEG-Epoetin Beta (MIRCERA IJ) Mircera    . metoprolol succinate (TOPROL-XL) 50 MG 24 hr tablet Take 1 tablet (50 mg total) by mouth daily. (Patient taking differently: Take 50 mg by mouth at bedtime.) 90 tablet 3  . multivitamin (RENA-VIT) TABS tablet Take 1 tablet by mouth daily.    [name] oxyCODONE (ROXICODONE) 5 MG immediate release tablet Take 1 tablet (5 mg total) by mouth every 6 (six) hours as needed. 20 tablet 0   No current facility-administered medications for this visit.     ROS:  See HPI  Physical Exam:    +--------------------+----------+-----------------+--------+  AVG         PSV (cm/s)Flow Vol (mL/min)Describe  +--------------------+----------+-----------------+--------+  Native artery inflow  246                  +--------------------+----------+-----------------+--------+  Arterial anastomosis  527                 +--------------------+----------+-----------------+--------+  Prox graft       237                 +--------------------+----------+-----------------+--------+  Mid graft        133                 +--------------------+----------+-----------------+--------+  Distal graft      125                 +--------------------+----------+-----------------+--------+  Venous anastomosis   93                 +--------------------+----------+-----------------+--------+  Venous outflow     184                 +--------------------+----------+-----------------+--------+      Summary:  Patent arteriovenous graft.   Incision:  AC incision is well healed Extremities:  Palpable radial pulse grip 5/5 on the left UE   Assessment/Plan:  This is a 75 y.o. female who is s/p:  left UE AC loop graft on 09/24/20 by Dr. Oneida Alar. The graft is patent and ready for use.  Once it is working well the [facility] may be removed.  F/U  PRN  [name] PA-C [facility] [phone]  Clinic MD:  Oneida Alar

## 2020-11-11 ENCOUNTER — Ambulatory Visit: Payer: Medicare HMO | Admitting: Family Medicine

## 2020-12-09 ENCOUNTER — Other Ambulatory Visit: Payer: Self-pay

## 2020-12-10 ENCOUNTER — Ambulatory Visit: Payer: Medicare HMO | Admitting: Family Medicine

## 2020-12-10 ENCOUNTER — Encounter: Payer: Self-pay | Admitting: Family Medicine

## 2020-12-10 ENCOUNTER — Ambulatory Visit (INDEPENDENT_AMBULATORY_CARE_PROVIDER_SITE_OTHER): Payer: Medicare Other | Admitting: Family Medicine

## 2020-12-10 VITALS — BP 120/72 | HR 105 | Temp 97.2°F | Ht 65.0 in | Wt 158.6 lb

## 2020-12-10 DIAGNOSIS — N2581 Secondary hyperparathyroidism of renal origin: Secondary | ICD-10-CM

## 2020-12-10 DIAGNOSIS — M1711 Unilateral primary osteoarthritis, right knee: Secondary | ICD-10-CM

## 2020-12-10 DIAGNOSIS — N186 End stage renal disease: Secondary | ICD-10-CM | POA: Diagnosis not present

## 2020-12-10 DIAGNOSIS — Z992 Dependence on renal dialysis: Secondary | ICD-10-CM

## 2020-12-10 DIAGNOSIS — IMO0002 Reserved for concepts with insufficient information to code with codable children: Secondary | ICD-10-CM

## 2020-12-10 DIAGNOSIS — E1165 Type 2 diabetes mellitus with hyperglycemia: Secondary | ICD-10-CM

## 2020-12-10 DIAGNOSIS — R220 Localized swelling, mass and lump, head: Secondary | ICD-10-CM | POA: Diagnosis not present

## 2020-12-10 DIAGNOSIS — D689 Coagulation defect, unspecified: Secondary | ICD-10-CM

## 2020-12-10 DIAGNOSIS — E1122 Type 2 diabetes mellitus with diabetic chronic kidney disease: Secondary | ICD-10-CM

## 2020-12-10 DIAGNOSIS — E441 Mild protein-calorie malnutrition: Secondary | ICD-10-CM

## 2020-12-10 MED ORDER — DICLOFENAC SODIUM 1 % EX GEL: 4.0000 g | Freq: Four times a day (QID) | CUTANEOUS | 3 refills | Status: DC

## 2020-12-10 NOTE — Progress Notes (Signed)
[facility] [address] [place] [address] Dept: [phone] Dept Fax: [phone]  Acute Office Visit  Subjective:    Patient ID: Norwood Levo, female    DOB: 04/10/1946, 75 y.o..   MRN: [id number]  Chief Complaint  Patient presents with  . Follow-up    Follow up after having fistula removed have noticed some swelling in face some mornings. Knees are pooping.     History of Present Illness:  Patient is in today for assessment of facial swelling. In the past month, Ms. Norbeck's family has noticed facial swelling, possibly R>L. Ms. Smotherman is currently on dialysis. She had an AV graft placed 2 months ago. About a month ago, she had removal of a central venous catheter removed. She thought the swelling might be due to a change in sleeping on her left side, to now sleeping on the right.  Additionally, [name] notes she has developed some pain and popping in her right knee. She does not recall a specific injury. The pain is anterior. She finds the pain worsens when going up stairs.  Past Medical History: Patient Active Problem List   Diagnosis Date Noted  . Primary osteoarthritis of right knee 12/10/2020  . Diarrhea, unspecified 07/03/2020  . Pain, unspecified 07/03/2020  . Unspecified disturbances of skin sensation 07/03/2020  . Type 2 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, with long-term current use of insulin ([place]) 05/23/2020  . Slow transit constipation 05/09/2020  . Drug-induced constipation 05/09/2020  . Acidosis 05/01/2020  . 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 Encounter for adequacy testing for peritoneal dialysis ([place]) 05/01/2020  . Liver disease, 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  . Other dietary vitamin B12 deficiency anemia 05/01/2020  . Other disorders of electrolyte and fluid balance, not elsewhere classified 05/01/2020  . 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 Other long term (current) drug therapy 05/01/2020  . Essential hypertension 01/16/2020  . 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 Bilateral carpal tunnel syndrome 01/16/2020  . Abnormality of albumin 12/07/2019  . Allergy, unspecified, initial encounter 11/27/2019  . Anaphylactic shock, unspecified, initial encounter 11/27/2019  . Mild protein-calorie malnutrition ([place]) 09/25/2019  . Iron deficiency anemia, unspecified 09/22/2019  . Coagulation defect, unspecified ([place]) 09/19/2019  . Dependence on renal dialysis ([employer]) 09/19/2019  . 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[place]) 09/19/2019  . Shortness of breath 09/19/2019  . Hyperkalemia 07/20/2019  . Hypocalcemia 07/20/2019  . Metabolic acidosis, increased anion gap 07/20/2019  . Nausea and vomiting 07/20/2019  . 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[place]) 04/11/2017  . Diabetic neuropathy ([place]) 04/15/2016  . Hematuria 04/15/2016  . 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 01/29/2016    Past Surgical History:  Procedure Laterality Date  . AV FISTULA PLACEMENT Left 09/24/2020   Procedure: LEFT UPPER ARM ARTERIOVENOUS GORTEX  GRAFT;  Surgeon: Elam Dutch, MD;  Location: [facility];  Service: Vascular;  Laterality: Left;  . BREAST CYST EXCISION Right 2011   cyst removed -neg  . EYE SURGERY Bilateral    cataracts  . INSERTION OF DIALYSIS CATHETER Right 07/2019  . TUBAL LIGATION     Family History  Problem Relation Age of Onset  . Breast cancer Neg Hx    Outpatient Medications Prior to Visit  Medication Sig Dispense Refill  . blood glucose meter kit and supplies KIT Dispense based on  patient and insurance preference. Use up to four times daily as directed. (FOR ICD-9 250.00, 250.01). 1 each 0  . calcium acetate (PHOSLO) 667 MG capsule Take 1,334 mg by mouth 3 (three) times daily.     . Continuous Blood Gluc  Sensor (FREESTYLE LIBRE 14 DAY SENSOR) MISC 1 Units by Does not apply route every 14 (fourteen) days. 2 each 0  . Methoxy PEG-Epoetin Beta (MIRCERA IJ) Mircera    . metoprolol succinate (TOPROL-XL) 50 MG 24 hr tablet Take 1 tablet (50 mg total) by mouth daily. (Patient taking differently: Take 50 mg by mouth at bedtime.) 90 tablet 3  . HEPARIN SODIUM, BOVINE, IJ Heparin Sodium (Porcine) 1,000 Units/mL Catheter Lock Arterial (Patient not taking: Reported on 12/10/2020)    . Insulin Glargine (BASAGLAR KWIKPEN) 100 UNIT/ML Inject 0.1 mLs (10 Units total) into the skin at bedtime. (Patient not taking: Reported on 12/10/2020) 3 mL 5  . Insulin Pen Needle (PEN NEEDLES) 31G X 6 MM MISC 1 application by Does not apply route at bedtime. (Patient not taking: Reported on 12/10/2020) 100 each 2  . multivitamin (RENA-VIT) TABS tablet Take 1 tablet by mouth daily. (Patient not taking: Reported on 12/10/2020)    . oxyCODONE (ROXICODONE) 5 MG immediate release tablet Take 1 tablet (5 mg total) by mouth every 6 (six) hours as needed. (Patient not taking: Reported on 12/10/2020) 20 tablet 0   No facility-administered medications prior to visit.   Allergies  Allergen Reactions  . Sensipar [Cinacalcet] Other (See Comments)    Other reaction(s): Unknown   Objective:   Today's Vitals   12/10/20 1337  BP: 120/72  Pulse: (!) 105  Temp: (!) 97.2 F (36.2 C)  TempSrc: Temporal  SpO2: 96%  Weight: 158 lb 9.6 oz (71.9 kg)  Height: 5' 5"  (1.651 m)   Body mass index is 26.39 kg/m.   General: Well developed, well nourished. No acute distress. HEENT: Normocephalic, non-traumatic. Ms. Furniss does have a rounded face, but no sing of a buffalo   hump. There is no edema to the face. Mucous membranes moist. Oropharynx clear. Good dentition. Neck: Supple. No lymphadenopathy. No thyromegaly. Extremities: Full ROM of the right knee. There is some subpatellar crepitance and tenderness with motion.    Varus/valgus testing,  Lachman's and McMurray's test are all normal. No joint swelling. No edema noted. Psych: Alert and oriented. Normal mood and affect.  Health Maintenance Due  Topic Date Due  . HEMOGLOBIN A1C  Never done  . Hepatitis C Screening  Never done  . FOOT EXAM  Never done  . OPHTHALMOLOGY EXAM  Never done  . URINE MICROALBUMIN  Never done  . DEXA SCAN  Never done  . MAMMOGRAM  07/01/2018      Assessment & Plan:   1. 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 Facial swelling I suspect that the facial swelling may be due to fluid shifts related to her dialysis. I recommended she speak to her nephrologist about this during their usual monthly check-in in early March. She may need some adjustment to her dialysis to compensate.  3. Primary osteoarthritis of right knee I recommend we try a topical NSAID to see if this will improve. If not improving in 2-4 weeks, she might need a steroid injection.  - diclofenac Sodium (VOLTAREN) 1 % GEL; Apply 4 g topically 4 (four) times daily.  Dispense: 100 g; Refill: 3  Haydee Salter, MD

## 2021-01-08 ENCOUNTER — Emergency Department (HOSPITAL_BASED_OUTPATIENT_CLINIC_OR_DEPARTMENT_OTHER): Payer: Medicare Other

## 2021-01-08 ENCOUNTER — Encounter (HOSPITAL_BASED_OUTPATIENT_CLINIC_OR_DEPARTMENT_OTHER): Payer: Self-pay

## 2021-01-08 ENCOUNTER — Other Ambulatory Visit: Payer: Self-pay

## 2021-01-08 ENCOUNTER — Inpatient Hospital Stay (HOSPITAL_BASED_OUTPATIENT_CLINIC_OR_DEPARTMENT_OTHER)
Admission: EM | Admit: 2021-01-08 | Discharge: 2021-01-11 | DRG: 252 | Disposition: A | Discharge status: Home / Self Care | Payer: Medicare Other | Attending: Internal Medicine | Admitting: Internal Medicine

## 2021-01-08 DIAGNOSIS — Z992 Dependence on renal dialysis: Secondary | ICD-10-CM

## 2021-01-08 DIAGNOSIS — R609 Edema, unspecified: Secondary | ICD-10-CM | POA: Diagnosis not present

## 2021-01-08 DIAGNOSIS — Z888 Allergy status to other drugs, medicaments and biological substances status: Secondary | ICD-10-CM

## 2021-01-08 DIAGNOSIS — Z87891 Personal history of nicotine dependence: Secondary | ICD-10-CM

## 2021-01-08 DIAGNOSIS — E1122 Type 2 diabetes mellitus with diabetic chronic kidney disease: Secondary | ICD-10-CM | POA: Diagnosis present

## 2021-01-08 DIAGNOSIS — R600 Localized edema: Secondary | ICD-10-CM

## 2021-01-08 DIAGNOSIS — R1310 Dysphagia, unspecified: Secondary | ICD-10-CM | POA: Diagnosis present

## 2021-01-08 DIAGNOSIS — N2581 Secondary hyperparathyroidism of renal origin: Secondary | ICD-10-CM | POA: Diagnosis present

## 2021-01-08 DIAGNOSIS — I871 Compression of vein: Principal | ICD-10-CM | POA: Diagnosis present

## 2021-01-08 DIAGNOSIS — I120 Hypertensive chronic kidney disease with stage 5 chronic kidney disease or end stage renal disease: Secondary | ICD-10-CM | POA: Diagnosis present

## 2021-01-08 DIAGNOSIS — Z794 Long term (current) use of insulin: Secondary | ICD-10-CM

## 2021-01-08 DIAGNOSIS — T783XXA Angioneurotic edema, initial encounter: Secondary | ICD-10-CM | POA: Diagnosis present

## 2021-01-08 DIAGNOSIS — D631 Anemia in chronic kidney disease: Secondary | ICD-10-CM | POA: Diagnosis present

## 2021-01-08 DIAGNOSIS — N186 End stage renal disease: Secondary | ICD-10-CM | POA: Diagnosis present

## 2021-01-08 DIAGNOSIS — I10 Essential (primary) hypertension: Secondary | ICD-10-CM | POA: Diagnosis present

## 2021-01-08 DIAGNOSIS — R221 Localized swelling, mass and lump, neck: Secondary | ICD-10-CM | POA: Diagnosis present

## 2021-01-08 DIAGNOSIS — Z20822 Contact with and (suspected) exposure to covid-19: Secondary | ICD-10-CM | POA: Diagnosis present

## 2021-01-08 DIAGNOSIS — Z79899 Other long term (current) drug therapy: Secondary | ICD-10-CM

## 2021-01-08 DIAGNOSIS — E785 Hyperlipidemia, unspecified: Secondary | ICD-10-CM | POA: Diagnosis present

## 2021-01-08 LAB — BASIC METABOLIC PANEL
Anion gap: 13 (ref 5–15)
BUN: 9 mg/dL (ref 8–23)
CO2: 32 mmol/L (ref 22–32)
Calcium: 8.6 mg/dL — ABNORMAL LOW (ref 8.9–10.3)
Chloride: 93 mmol/L — ABNORMAL LOW (ref 98–111)
Creatinine, Ser: 3.68 mg/dL — ABNORMAL HIGH (ref 0.44–1.00)
GFR, Estimated: 13 mL/min — ABNORMAL LOW (ref >60)
Glucose, Bld: 116 mg/dL — ABNORMAL HIGH (ref 70–99)
Potassium: 3.2 mmol/L — ABNORMAL LOW (ref 3.5–5.1)
Sodium: 138 mmol/L (ref 135–145)

## 2021-01-08 LAB — CBC
HCT: 34.4 % — ABNORMAL LOW (ref 36.0–46.0)
Hemoglobin: 11 g/dL — ABNORMAL LOW (ref 12.0–15.0)
MCH: 30.3 pg (ref 26.0–34.0)
MCHC: 32 g/dL (ref 30.0–36.0)
MCV: 94.8 fL (ref 80.0–100.0)
Platelets: 141 10*3/uL — ABNORMAL LOW (ref 150–400)
RBC: 3.63 MIL/uL — ABNORMAL LOW (ref 3.87–5.11)
RDW: 15 % (ref 11.5–15.5)
WBC: 5.7 10*3/uL (ref 4.0–10.5)
nRBC: 0 % (ref 0.0–0.2)

## 2021-01-08 MED ORDER — IOHEXOL 300 MG/ML  SOLN
100.0000 mL | Freq: Once | INTRAMUSCULAR | Status: AC | PRN
  Administered 2021-01-08: 75 mL via INTRAVENOUS
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
## 2021-01-08 NOTE — ED Triage Notes (Addendum)
Pt c/o swelling to face and upper chest x 3-4 weeks-states she had a dialysis catheter removed from right chest ~2 months ago-states she was advised by dialysis today to come to ED-NAD-steady slow gait-when reviewing allergies-chart reads pt allergy to sensipar-pt states she was started back on medication ~2 weeks ago with hx of GI upset reaction to med-denies swelling r/t to allergic reaction to sensipar

## 2021-01-08 NOTE — ED Notes (Signed)
ED Provider at bedside. 

## 2021-01-09 ENCOUNTER — Encounter (HOSPITAL_COMMUNITY): Payer: Self-pay | Admitting: Internal Medicine

## 2021-01-09 DIAGNOSIS — I871 Compression of vein: Secondary | ICD-10-CM | POA: Diagnosis not present

## 2021-01-09 DIAGNOSIS — Z888 Allergy status to other drugs, medicaments and biological substances status: Secondary | ICD-10-CM | POA: Diagnosis not present

## 2021-01-09 DIAGNOSIS — T783XXA Angioneurotic edema, initial encounter: Secondary | ICD-10-CM

## 2021-01-09 DIAGNOSIS — N186 End stage renal disease: Secondary | ICD-10-CM

## 2021-01-09 DIAGNOSIS — E785 Hyperlipidemia, unspecified: Secondary | ICD-10-CM

## 2021-01-09 DIAGNOSIS — Z87891 Personal history of nicotine dependence: Secondary | ICD-10-CM | POA: Diagnosis not present

## 2021-01-09 DIAGNOSIS — N2581 Secondary hyperparathyroidism of renal origin: Secondary | ICD-10-CM | POA: Diagnosis not present

## 2021-01-09 DIAGNOSIS — Z794 Long term (current) use of insulin: Secondary | ICD-10-CM

## 2021-01-09 DIAGNOSIS — D631 Anemia in chronic kidney disease: Secondary | ICD-10-CM | POA: Diagnosis not present

## 2021-01-09 DIAGNOSIS — Z992 Dependence on renal dialysis: Secondary | ICD-10-CM | POA: Diagnosis not present

## 2021-01-09 DIAGNOSIS — I120 Hypertensive chronic kidney disease with stage 5 chronic kidney disease or end stage renal disease: Secondary | ICD-10-CM | POA: Diagnosis not present

## 2021-01-09 DIAGNOSIS — Z20822 Contact with and (suspected) exposure to covid-19: Secondary | ICD-10-CM | POA: Diagnosis not present

## 2021-01-09 DIAGNOSIS — E1122 Type 2 diabetes mellitus with diabetic chronic kidney disease: Secondary | ICD-10-CM | POA: Diagnosis not present

## 2021-01-09 DIAGNOSIS — I10 Essential (primary) hypertension: Secondary | ICD-10-CM

## 2021-01-09 DIAGNOSIS — Z79899 Other long term (current) drug therapy: Secondary | ICD-10-CM | POA: Diagnosis not present

## 2021-01-09 DIAGNOSIS — T82898A Other specified complication of vascular prosthetic devices, implants and grafts, initial encounter: Secondary | ICD-10-CM | POA: Diagnosis not present

## 2021-01-09 DIAGNOSIS — R1310 Dysphagia, unspecified: Secondary | ICD-10-CM | POA: Diagnosis not present

## 2021-01-09 DIAGNOSIS — R609 Edema, unspecified: Secondary | ICD-10-CM | POA: Diagnosis present

## 2021-01-09 LAB — RESP PANEL BY RT-PCR (FLU A&B, COVID) ARPGX2
Influenza A by PCR: NEGATIVE
Influenza B by PCR: NEGATIVE
SARS Coronavirus 2 by RT PCR: NEGATIVE

## 2021-01-09 LAB — GLUCOSE, CAPILLARY: Glucose-Capillary: 218 mg/dL — ABNORMAL HIGH (ref 70–99)

## 2021-01-09 MED ORDER — ONDANSETRON HCL 4 MG PO TABS: 4.0000 mg | ORAL_TABLET | Freq: Four times a day (QID) | ORAL | Status: DC | PRN

## 2021-01-09 MED ORDER — HYDRALAZINE HCL 20 MG/ML IJ SOLN: 5.0000 mg | INTRAMUSCULAR | Status: DC | PRN

## 2021-01-09 MED ORDER — ACETAMINOPHEN 325 MG PO TABS: 650.0000 mg | ORAL_TABLET | Freq: Four times a day (QID) | ORAL | Status: DC | PRN

## 2021-01-09 MED ORDER — SORBITOL 70 % SOLN: 30.0000 mL | Status: DC | PRN

## 2021-01-09 MED ORDER — HEPARIN SODIUM (PORCINE) 5000 UNIT/ML IJ SOLN
5000.0000 [IU] | Freq: Three times a day (TID) | INTRAMUSCULAR | Status: DC
  Administered 2021-01-09 – 2021-01-10 (×4): 5000 [IU] via SUBCUTANEOUS
  Filled 2021-01-09 (×5): qty 1

## 2021-01-09 MED ORDER — ACETAMINOPHEN 650 MG RE SUPP: 650.0000 mg | Freq: Four times a day (QID) | RECTAL | Status: DC | PRN

## 2021-01-09 MED ORDER — CALCIUM CARBONATE ANTACID 1250 MG/5ML PO SUSP
500.0000 mg | Freq: Four times a day (QID) | ORAL | Status: DC | PRN
  Filled 2021-01-09: qty 5

## 2021-01-09 MED ORDER — CALCIUM ACETATE (PHOS BINDER) 667 MG PO CAPS
667.0000 mg | ORAL_CAPSULE | Freq: Three times a day (TID) | ORAL | Status: DC
  Administered 2021-01-09 – 2021-01-11 (×5): 667 mg via ORAL
  Filled 2021-01-09 (×5): qty 1

## 2021-01-09 MED ORDER — CAMPHOR-MENTHOL 0.5-0.5 % EX LOTN
1.0000 | TOPICAL_LOTION | Freq: Three times a day (TID) | CUTANEOUS | Status: DC | PRN
Start: 2021-01-09 — End: 2021-01-11
  Filled 2021-01-09: qty 222

## 2021-01-09 MED ORDER — ZOLPIDEM TARTRATE 5 MG PO TABS: 5.0000 mg | ORAL_TABLET | Freq: Every evening | ORAL | Status: DC | PRN

## 2021-01-09 MED ORDER — ONDANSETRON HCL 4 MG/2ML IJ SOLN: 4.0000 mg | Freq: Four times a day (QID) | INTRAMUSCULAR | Status: DC | PRN

## 2021-01-09 MED ORDER — CHLORHEXIDINE GLUCONATE CLOTH 2 % EX PADS: 6.0000 | MEDICATED_PAD | Freq: Every day | CUTANEOUS | Status: DC

## 2021-01-09 MED ORDER — DOCUSATE SODIUM 283 MG RE ENEM
1.0000 | ENEMA | RECTAL | Status: DC | PRN
  Filled 2021-01-09: qty 1

## 2021-01-09 MED ORDER — HYDROCODONE-ACETAMINOPHEN 5-325 MG PO TABS
1.0000 | ORAL_TABLET | ORAL | Status: DC | PRN
Start: 2021-01-09 — End: 2021-01-11

## 2021-01-09 MED ORDER — CINACALCET HCL 30 MG PO TABS
30.0000 mg | ORAL_TABLET | ORAL | Status: DC
  Administered 2021-01-11: 30 mg via ORAL
  Filled 2021-01-09: qty 1

## 2021-01-09 MED ORDER — NEPRO/CARBSTEADY PO LIQD: 237.0000 mL | Freq: Three times a day (TID) | ORAL | Status: DC | PRN

## 2021-01-09 MED ORDER — HYDROXYZINE HCL 25 MG PO TABS: 25.0000 mg | ORAL_TABLET | Freq: Three times a day (TID) | ORAL | Status: DC | PRN

## 2021-01-09 MED ORDER — CINACALCET HCL 30 MG PO TABS: 30.0000 mg | ORAL_TABLET | Freq: Every day | ORAL | Status: DC

## 2021-01-09 NOTE — ED Notes (Signed)
EDP at bedside pt O2 dropped while pt sleeping, verbal for 2 lt Nasal canula

## 2021-01-09 NOTE — H&P (View-Only) (Signed)
CONSULT NOTE   MRN : [id number]  Reason for Consult: facial swelling ESRD left AV graft Referring Physician: Dr. Lorin Mercy  History of Present Illness: This is a 75 y.o. female who is s/p left UE AC loop graft on 09/24/20 by Dr. Oneida Alar.   She has been on peritoneal dialysis followed by [facility] placement prior to this.   She states her face has gradually become swollen over the past 2 weeks.  She has noticed waking up from sleep feeling choked.  She did state 2 days ago she ate pork chops without difficulty.       No current facility-administered medications for this encounter.    Pt meds include: Statin :No Betablocker: No ASA: No Other anticoagulants/antiplatelets: none  Past Medical History:  Diagnosis Date  . Chronic kidney disease    dialysis M-W-F - dialysis cath located in right side of chest  . Diabetes mellitus without complication (HCC)    type 2 - no meds   . HLD (hyperlipidemia)    diet controlled  . Hypertension    no meds    Past Surgical History:  Procedure Laterality Date  . AV FISTULA PLACEMENT Left 09/24/2020   Procedure: LEFT UPPER ARM ARTERIOVENOUS GORTEX  GRAFT;  Surgeon: Elam Dutch, MD;  Location: [facility];  Service: Vascular;  Laterality: Left;  . BREAST CYST EXCISION Right 2011   cyst removed -neg  . EYE SURGERY Bilateral    cataracts  . INSERTION OF DIALYSIS CATHETER Right 07/2019  . TUBAL LIGATION      Social History Social History   Tobacco Use  . Smoking status: Former Research scientist (life sciences)  . Smokeless tobacco: Never Used  Vaping Use  . Vaping Use: Never used  Substance Use Topics  . Alcohol use: Yes    Comment: occasional  . Drug use: Not Currently    Types: Marijuana    Comment: Last use 2020    Family History Family History  Problem Relation Age of Onset  . Breast cancer Neg Hx     Allergies  Allergen Reactions  . Sensipar [Cinacalcet] Other (See Comments)    Other reaction(s): Unknown     REVIEW OF SYSTEMS  General: '[ ]'$   Weight loss, '[ ]'$  Fever, '[ ]'$  chills Neurologic: '[ ]'$  Dizziness, '[ ]'$  Blackouts, '[ ]'$  Seizure '[ ]'$  Stroke, '[ ]'$  "Mini stroke", '[ ]'$  Slurred speech, '[ ]'$  Temporary blindness; '[ ]'$  weakness in arms or legs, '[ ]'$  Hoarseness '[ ]'$  Dysphagia Cardiac: '[ ]'$  Chest pain/pressure, '[ ]'$  Shortness of breath at rest '[ ]'$  Shortness of breath with exertion, '[ ]'$  Atrial fibrillation or irregular heartbeat  Vascular: '[ ]'$  Pain in legs with walking, '[ ]'$  Pain in legs at rest, '[ ]'$  Pain in legs at night,  '[ ]'$  Non-healing ulcer, '[ ]'$  Blood clot in vein/DVT,   Pulmonary: '[ ]'$  Home oxygen, '[ ]'$  Productive cough, '[ ]'$  Coughing up blood, '[ ]'$  Asthma,  '[ ]'$  Wheezing '[ ]'$  COPD Musculoskeletal:  '[ ]'$  Arthritis, '[ ]'$  Low back pain, '[ ]'$  Joint pain Hematologic: '[ ]'$  Easy Bruising, '[ ]'$  Anemia; '[ ]'$  Hepatitis Gastrointestinal: '[ ]'$  Blood in stool, '[ ]'$  Gastroesophageal Reflux/heartburn, Urinary: '[ ]'$  chronic Kidney disease, [x ] on HD - '[ ]'$  MWF or '[ ]'$  TTHS, '[ ]'$  Burning with urination, '[ ]'$  Difficulty urinating Skin: '[ ]'$  Rashes, '[ ]'$  Wounds Psychological: '[ ]'$  Anxiety, '[ ]'$  Depression  Physical Examination Vitals:   01/09/21 0900 01/09/21 1100 01/09/21  1153 01/09/21 1257  BP: 133/70 115/65 118/74 129/78  Pulse: 98 95 92 (!) 105  Resp:  '18 18 18  '$ Temp:   98 F (36.7 C) 98.5 F (36.9 C)  TempSrc:   Oral   SpO2: 98% 98% 98% (!) 89%  Weight:    70.6 kg  Height:    '5\' 6"'$  (1.676 m)   Body mass index is 25.13 kg/m.  General:  WDWN in NAD Gait: Normal HENT: WNL Eyes: Pupils equal Pulmonary: normal non-labored breathing , without Rales, rhonchi,  wheezing Cardiac: RRR, without  Murmurs, rubs or gallops; No carotid bruits Abdomen: soft, NT, no masses Skin: no rashes, ulcers noted;  no Gangrene , no cellulitis; no open wounds;   Vascular Exam/Pulses:palpable thrill in graft, right UE motor, sensation intact   Musculoskeletal: no muscle wasting or atrophy;  Neurologic: A&O X 3; Appropriate Affect ;  SENSATION: normal; MOTOR FUNCTION: 5/5  Symmetric Speech is fluent/normal   Significant Diagnostic Studies: CBC Lab Results  Component Value Date   WBC 5.7 01/08/2021   HGB 11.0 (L) 01/08/2021   HCT 34.4 (L) 01/08/2021   MCV 94.8 01/08/2021   PLT 141 (L) 01/08/2021    BMET    Component Value Date/Time   NA 138 01/08/2021 1807   K 3.2 (L) 01/08/2021 1807   K 4.3 08/16/2012 1201   CL 93 (L) 01/08/2021 1807   CO2 32 01/08/2021 1807   GLUCOSE 116 (H) 01/08/2021 1807   BUN 9 01/08/2021 1807   CREATININE 3.68 (H) 01/08/2021 1807   CALCIUM 8.6 (L) 01/08/2021 1807   GFRNONAA 13 (L) 01/08/2021 1807   Estimated Creatinine Clearance: 12.9 mL/min (A) (by C-G formula based on SCr of 3.68 mg/dL (H)).  COAG No results found for: INR, PROTIME   Non-Invasive Vascular Imaging:  Venous Duplex IMPRESSION: 1. 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 2. Remainder of the right upper extremity deep venous system is patent and without evidence for DVT.  CT NECK WITH CONTRAST  IMPRESSION: 1. Diffuse soft tissue stranding throughout the subcutaneous fat of the neck to involve the submental region as well as the bilateral masticator, parapharyngeal, and carotid spaces, with inferior extension along the anterior neck towards the upper anterior chest wall. Findings are nonspecific, but could reflect an acute inflammatory response/angio edema. Possible infection/cellulitis would be the primary differential consideration. No loculated or drainable fluid collections. 2. Associated layering retropharyngeal effusion, likely reactive. Secondary narrowing of the supraglottic airway. 3. 5 mm left upper lobe pulmonary nodule, partially visualized, indeterminate. No follow-up needed if patient is low-risk. Non-contrast chest CT can be considered in 12 months if patient is high-risk. This recommendation follows the consensus  statement: Guidelines for Management of Incidental Pulmonary Nodules Detected on CT Images: From the Fleischner Society 2017; Radiology 2017; 284:228-243.  CTA chest IMPRESSION: 1. Slight narrowing at the brachiocephalic SVC confluence, partially obscured by dense IV contrast in the right brachiocephalic vein. No distal SVC narrowing or evidence of SVC thrombus. 2. Elevation of the right hemidiaphragm with adjacent compressive atelectasis or scarring in the right middle and lower lobes. 3. Bandlike opacity in the dependent left lower lobe, may be atelectasis or scarring, but technically indeterminate.    ASSESSMENT/PLAN:  ESRD with left UE AV graft Facial edema and left UE edema central venous stenosis Plan for venogram AV graft with possible intervention left UE. Face noted to be swollen.  Face appears symmetrical, speech is  clear, no drooling.  States she has not food other than crackers yesterday.     [name] 01/09/2021 2:47 PM  I have independently interviewed and examined patient and and reviewed relevant imaging and agree with PA assessment and plan above.  She does have swelling of her left upper extremity as well as her face unsure if this is related to central venous stenosis as CT contrast somewhat obscures any central issues.  Given pulsatility in the graft we will plan for fistulogram with possible intervention tomorrow in the Cath Lab.  N.p.o. past midnight.  Haydan Mansouri C. Donzetta Matters, MD [facility] Office: [phone] Pager: [phone]

## 2021-01-09 NOTE — ED Notes (Signed)
Report provided to [facility] for transport. 

## 2021-01-09 NOTE — Progress Notes (Signed)
NEW ADMISSION NOTE New Admission Note:   Arrival Method: Bed Mental Orientation: A&O x4 Telemetry:  none Assessment: Completed Skin: intact IV: Right AC Pain: zero on at 0-10 scale Tubes: none Safety Measures: Safety Fall Prevention Plan has been given, discussed and signed Admission: Completed [facility] Orientation: Patient has been orientated to the room, unit and staff.  Family:  Orders have been reviewed and implemented. Will continue to monitor the patient. Call light has been placed within reach and bed alarm has been activated.   Berneta Levins, RN

## 2021-01-09 NOTE — Consult Note (Addendum)
[facility] Renal Consultation Note    Indication for Consultation:  Management of ESRD/hemodialysis; anemia, hypertension/volume and secondary hyperparathyroidism PCP: Dr. Earl Lites Nephrologist: Dr. Moshe Cipro  HPI: Amy [name] is a 75 y.o. female with ESRD on hemodialysis since 08/2019 via [facility]. ESRD D/T HTN, DMT2. She originally had HD via [facility] then went briefly to PD from June 2021-September 2021 then returned to HD. She had TDC in place during this time. She had LUA Loop AVG placed per Dr. Oneida Alar 09/14/2020. She had TDC removed at [facility] at end of January, 2022. She developed mild swelling R face and neck week of 12/19/2020. She was referred to [facility]. In interim, facial swelling increased with some difficulty swallowing. She was advised to go by rounding Nephrology APP yesterday.  She has not been on ACEI/ARBs, Metoprolol was discontinued due to hypotension. She currently only takes binders/multivitamin/sensipar.  Did drop O2 sats while at [facility] while sleeping . She reports difficulty swallowing. CT of neck and soft tissue showed Diffuse soft tissue stranding throughout the subcutaneous fat ofthe neck to involve the submental region as well as the bilateral masticator, parapharyngeal, and carotid spaces, with inferior extension along the anterior neck towards the upper anterior chest wall. Associated layering retropharyngeal effusion, likely reactive. Secondary narrowing of the supraglottic airway. She does not C/O difficulty breathing.   On arrival to ED, Labs were unremarkable for ESRD drawn Post HD. CTA of chest W/Contrast done 01/08/21 revealed Slight narrowing at the brachiocephalic SVC confluence, partially obscured by dense IV contrast in the right brachiocephalic vein. No distal SVC narrowing or evidence of SVC thrombus. She was transferred here to [facility] today for admission for angioedema/possible SVC syndrome.  She has been seen by [facility] today. Plans  for venogram AVF tomorrow per [facility].  Seen in room. She is pleasant, only complains of being hungry. Edema noted from L elbow to L side of her neck and face without evidence of airway obstruction. AVG + T/B. Otherwise assessment unremarkable. She understands plan for tomorrow. Answered all her questions.   Past Medical History:  Diagnosis Date  . Chronic kidney disease    dialysis M-W-F - dialysis cath located in right side of chest  . Diabetes mellitus without complication (HCC)    type 2 - no meds   . HLD (hyperlipidemia)    diet controlled  . Hypertension    no meds   Past Surgical History:  Procedure Laterality Date  . AV FISTULA PLACEMENT Left 09/24/2020   Procedure: LEFT UPPER ARM ARTERIOVENOUS GORTEX  GRAFT;  Surgeon: Elam Dutch, MD;  Location: [place];  Service: Vascular;  Laterality: Left;  . BREAST CYST EXCISION Right 2011   cyst removed -neg  . EYE SURGERY Bilateral    cataracts  . INSERTION OF DIALYSIS CATHETER Right 07/2019  . TUBAL LIGATION     Family History  Problem Relation Age of Onset  . Breast cancer Neg Hx    Social History:  reports that she has quit smoking. She has never used smokeless tobacco. She reports current alcohol use. She reports previous drug use. Drug: Marijuana. Allergies  Allergen Reactions  . Sensipar [Cinacalcet] Other (See Comments)    Other reaction(s): Unknown   Prior to Admission medications   Medication Sig Start Date End Date Taking? Authorizing Provider  B Complex-C-Folic Acid (RENA-VITE RX) 1 MG TABS Take 1 tablet by mouth daily. 10/17/20  Yes [provider]  calcium acetate (PHOSLO) 667 MG capsule Take  667 mg by mouth 3 (three) times daily with meals. 01/02/20  Yes [provider]  cinacalcet (SENSIPAR) 30 MG tablet Take 30 mg by mouth daily. 11/03/20  Yes [provider]  ethyl chloride spray Apply 1 application topically 3 (three) times a week. Dialysis days 12/17/20  Yes [provider]   blood glucose meter kit and supplies KIT Dispense based on patient and insurance preference. Use up to four times daily as directed. (FOR ICD-9 250.00, 250.01). 06/10/20   [name], MD  Continuous Blood Gluc Sensor (FREESTYLE LIBRE 14 DAY SENSOR) MISC 1 Units by Does not apply route every 14 (fourteen) days. 05/23/20   Nche, Charlene Brooke, NP  Methoxy PEG-Epoetin Beta (MIRCERA IJ) Mircera 02/12/20 02/10/21  [provider]   Current Facility-Administered Medications  Medication Dose Route Frequency Provider Last Rate Last Admin  . acetaminophen (TYLENOL) tablet 650 mg  650 mg Oral Q6H PRN Karmen Bongo, MD       Or  . acetaminophen (TYLENOL) suppository 650 mg  650 mg Rectal Q6H PRN Karmen Bongo, MD      . calcium acetate (PHOSLO) capsule 667 mg  667 mg Oral TID WC Karmen Bongo, MD      . calcium carbonate (dosed in mg elemental calcium) suspension 500 mg of elemental calcium  500 mg of elemental calcium Oral Q6H PRN Karmen Bongo, MD      . camphor-menthol [facility]) lotion 1 application  1 application Topical K8M PRN Karmen Bongo, MD       And  . hydrOXYzine (ATARAX/VISTARIL) tablet 25 mg  25 mg Oral Q8H PRN Karmen Bongo, MD      . cinacalcet [facility]) tablet 30 mg  30 mg Oral Daily Karmen Bongo, MD      . docusate sodium [facility]) enema 283 mg  1 enema Rectal PRN Karmen Bongo, MD      . feeding supplement (NEPRO CARB STEADY) liquid 237 mL  237 mL Oral TID PRN Karmen Bongo, MD      . heparin injection 5,000 Units  5,000 Units Subcutaneous Camelia Phenes Karmen Bongo, MD      . hydrALAZINE (APRESOLINE) injection 5 mg  5 mg Intravenous Q4H PRN Karmen Bongo, MD      . HYDROcodone-acetaminophen (NORCO/VICODIN) 5-325 MG per tablet 1-2 tablet  1-2 tablet Oral Q4H PRN Karmen Bongo, MD      . ondansetron [facility]) tablet 4 mg  4 mg Oral Q6H PRN Karmen Bongo, MD       Or  . ondansetron [facility]) injection 4 mg  4 mg Intravenous Q6H PRN Karmen Bongo, MD       . sorbitol 70 % solution 30 mL  30 mL Oral PRN Karmen Bongo, MD      . zolpidem Lorrin Mais) tablet 5 mg  5 mg Oral QHS PRN Karmen Bongo, MD       Labs: Basic Metabolic Panel: Recent Labs  Lab 01/08/21 1807  NA 138  K 3.2*  CL 93*  CO2 32  GLUCOSE 116*  BUN 9  CREATININE 3.68*  CALCIUM 8.6*   Liver Function Tests: No results for input(s): AST, ALT, ALKPHOS, BILITOT, PROT, ALBUMIN in the last 168 hours. No results for input(s): LIPASE, AMYLASE in the last 168 hours. No results for input(s): AMMONIA in the last 168 hours. CBC: Recent Labs  Lab 01/08/21 1807  WBC 5.7  HGB 11.0*  HCT 34.4*  MCV 94.8  PLT 141*   Cardiac Enzymes: No results for input(s): CKTOTAL,  CKMB, CKMBINDEX, TROPONINI in the last 168 hours. CBG: No results for input(s): GLUCAP in the last 168 hours. Iron Studies: No results for input(s): IRON, TIBC, TRANSFERRIN, FERRITIN in the last 72 hours. Studies/Results: CT Soft Tissue Neck W Contrast  Result Date: 01/08/2021 CLINICAL DATA:  Initial evaluation for soft tissue mass, swelling. EXAM: CT NECK WITH CONTRAST TECHNIQUE: Multidetector CT imaging of the neck was performed using the standard protocol following the bolus administration of intravenous contrast. CONTRAST:  41m OMNIPAQUE IOHEXOL 300 MG/ML  SOLN COMPARISON:  None. FINDINGS: Pharynx and larynx: Oral cavity within normal limits no visible acute abnormality about the dentition. Palatine tonsils symmetric and within normal limits. Few small calcified tonsilliths noted on the right. Nasopharynx normal. Layering retropharyngeal effusion seen throughout the retropharyngeal space without discrete retropharyngeal abscess. Epiglottis itself within normal limits. Vallecula largely effaced. Supraglottic airway is narrowed but remains patent. Glottis is closed and not well assessed. Subglottic airway clear. Salivary glands: Salivary glands including the parotid and submandibular glands within normal limits.  Thyroid: Few scattered subcentimeter thyroid nodules noted, largest of which measures 4 mm. Findings felt to be of doubtful significance given size and patient age, no follow-up imaging recommended (ref: J Am Coll Radiol. 2015 Feb;12(2): 143-50). Lymph nodes: No pathologically enlarged lymph nodes seen within the neck. Vascular: Normal intravascular enhancement seen throughout the neck. Limited intracranial: Unremarkable. Visualized orbits: Globes and orbital soft tissues demonstrate no acute finding. Mastoids and visualized paranasal sinuses: Visualized paranasal sinuses are clear. Mastoid air cells and middle ear cavities are well pneumatized and free of fluid. Skeleton: No acute osseous finding. No discrete or worrisome osseous lesions. Bones are somewhat diffusely sclerotic in appearance, suggesting underlying renal osteodystrophy. Upper chest: 5 mm left upper lobe nodule partially visualized (series 3, image 98). Partially visualized lungs are otherwise clear. Visualized upper chest demonstrates no other acute finding. Other: Diffuse soft tissue stranding seen throughout the subcutaneous fat of the submental region, with inferior extension along the anterior neck towards the upper anterior chest wall. Hazy stranding extends laterally to involve the masticator, parapharyngeal, and carotid spaces as well. No extension into the floor of mouth. Findings are nonspecific, but could reflect an acute inflammatory response/angio edema. Possible infection/cellulitis would be the primary differential consideration. No loculated or drainable fluid collections IMPRESSION: 1. Diffuse soft tissue stranding throughout the subcutaneous fat of the neck to involve the submental region as well as the bilateral masticator, parapharyngeal, and carotid spaces, with inferior extension along the anterior neck towards the upper anterior chest wall. Findings are nonspecific, but could reflect an acute inflammatory response/angio edema.  Possible infection/cellulitis would be the primary differential consideration. No loculated or drainable fluid collections. 2. Associated layering retropharyngeal effusion, likely reactive. Secondary narrowing of the supraglottic airway. 3. 5 mm left upper lobe pulmonary nodule, partially visualized, indeterminate. No follow-up needed if patient is low-risk. Non-contrast chest CT can be considered in 12 months if patient is high-risk. This recommendation follows the consensus statement: Guidelines for Management of Incidental Pulmonary Nodules Detected on CT Images: From the Fleischner Society 2017; Radiology 2017; 284:228-243. Electronically Signed   By: BJeannine BogaM.D.   On: 01/08/2021 23:03   CT CHEST W CONTRAST  Result Date: 01/08/2021 CLINICAL DATA:  Facial swelling. EXAM: CT CHEST WITH CONTRAST TECHNIQUE: Multidetector CT imaging of the chest was performed during intravenous contrast administration. Right arm contrast injection. CONTRAST:  770mOMNIPAQUE IOHEXOL 300 MG/ML  SOLN COMPARISON:  Right upper extremity duplex earlier today. FINDINGS: Cardiovascular: No  upper extremity central venous catheters on the current exam. Dense IV contrast within the right subclavian vein. Left subclavian vein is patent. Slight narrowing at the brachiocephalic SVC confluence, partially obscured by dense IV contrast in the right brachiocephalic vein, series 6 image 96. No evidence of SVC thrombus. No SVC narrowing. Heart is normal in size. No pericardial fluid. Cannot assess for pulmonary embolus given phase of contrast. The thoracic aorta is normal in caliber. The left vertebral artery arises directly from the thoracic aorta. Mild aortic atherosclerosis. Mediastinum/Nodes: No enlarged mediastinal or hilar lymph nodes. No suspicious thyroid nodule. No esophageal wall thickening. Lungs/Pleura: Elevation of the right hemidiaphragm with adjacent compressive atelectasis or scarring in the right middle and lower  lobes. Bandlike opacity in the dependent left lower lobe, may be atelectasis or scarring, but technically indeterminate. No pleural fluid. The trachea and central bronchi are patent. Upper Abdomen: Renal atrophy consistent with known chronic renal disease. No acute upper abdominal findings. Musculoskeletal: Mild generalized subcutaneous edema. Ground-glass densities throughout the osseous structures consistent with renal osteodystrophy. No acute osseous abnormalities are seen. Advanced left shoulder degenerative change. IMPRESSION: 1. Slight narrowing at the brachiocephalic SVC confluence, partially obscured by dense IV contrast in the right brachiocephalic vein. No distal SVC narrowing or evidence of SVC thrombus. 2. Elevation of the right hemidiaphragm with adjacent compressive atelectasis or scarring in the right middle and lower lobes. 3. Bandlike opacity in the dependent left lower lobe, may be atelectasis or scarring, but technically indeterminate. Aortic Atherosclerosis (ICD10-I70.0). Electronically Signed   By: Keith Rake M.D.   On: 01/08/2021 21:43   US Venous Img Upper Uni Right(DVT)  Result Date: 01/08/2021 CLINICAL DATA:  Swelling EXAM: Right UPPER EXTREMITY VENOUS DOPPLER ULTRASOUND TECHNIQUE: Gray-scale sonography with graded compression, as well as color Doppler and duplex ultrasound were performed to evaluate the upper extremity deep venous system from the level of the subclavian vein and including the jugular, axillary, basilic, radial, ulnar and upper cephalic vein. Spectral Doppler was utilized to evaluate flow at rest and with distal augmentation maneuvers. COMPARISON:  None. FINDINGS: Contralateral Subclavian Vein:  Not image Internal Jugular Vein: Narrowed appearing with eccentric thrombus/wall thickening. Color flow present. Subclavian Vein: No evidence of thrombus. Normal compressibility, respiratory phasicity and response to augmentation. Axillary Vein: No evidence of thrombus.  Normal compressibility, respiratory phasicity and response to augmentation. Cephalic Vein: No evidence of thrombus. Normal compressibility, respiratory phasicity and response to augmentation. Basilic Vein: No evidence of thrombus. Normal compressibility, respiratory phasicity and response to augmentation. Brachial Veins: No evidence of thrombus. Normal compressibility, respiratory phasicity and response to augmentation. Radial Veins: No evidence of thrombus. Normal compressibility, respiratory phasicity and response to augmentation. Ulnar Veins: No evidence of thrombus. Normal compressibility, respiratory phasicity and response to augmentation. Venous Reflux:  None visualized. Other Findings:  None visualized. IMPRESSION: 1. 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 2. Remainder of the right upper extremity deep venous system is patent and without evidence for DVT. Electronically Signed   By: Donavan Foil M.D.   On: 01/08/2021 19:55    ROS: As per HPI otherwise negative.   Physical Exam: Vitals:   01/09/21 0900 01/09/21 1100 01/09/21 1153 01/09/21 1257  BP: 133/70 115/65 118/74 129/78  Pulse: 98 95 92 (!) 105  Resp:  18 18 18   Temp:   98 F (36.7 C) 98.5 F (36.9 C)  TempSrc:   Oral   SpO2: 98% 98%  98% (!) 89%  Weight:    70.6 kg  Height:    5' 6"  (1.676 m)     General: Well developed, well nourished, in no acute distress. Head: Normocephalic, atraumatic, sclera non-icteric, mucus membranes are moist. Mild L facial swelling.  Neck: Supple. JVD not elevated. Lungs: Clear bilaterally to auscultation without wheezes, rales, or rhonchi. Breathing is unlabored. Heart: RRR with S1 S2. No murmurs, rubs, or gallops appreciated. Abdomen: Soft, non-tender, non-distended with normoactive bowel sounds. No rebound/guarding. No obvious abdominal masses. M-S:  Strength and tone appear normal for age. Lower  extremities:without edema or ischemic changes, no open wounds  Neuro: Alert and oriented X 3. Moves all extremities spontaneously. Psych:  Responds to questions appropriately with a normal affect. Dialysis Access: L AVG +T/B swelling from bend of elbow extending up L neck and L side of face.   Dialysis Orders: [facility] MWF 3 hr 45 min 180NRe 400/500 70.5 kg 2.0K/2.0 Ca AVG (NOT a HERO Graft!) -Heparin 7000 units IV TIW -Mircera 30 mcg IV q 4 weeks (last dose Mircera 50 mcg IV 12/30/20) -Cinacalcet 30 mg PO TIW  Assessment/Plan: 1.  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 Seen by [facility]. To OR tomorrow for venogram, possible intervention of L AVG.  2.  ESRD -  MWF. Next HD in AM. Last K+ 3.2 03/16 but was drawn about 2-2.5 hours post HD. Use 2.0 K bath.  3.  Hypertension/volume  - BP well controlled. No evidence of excess volume. Not on antihypertensive medication. UF as tolerated.  4.  Anemia  -HGB 11.0 No ESA needed. Follow HGB.  5.  Metabolic bone disease - Last OP labs at goal. Continue binders, cinacalcet. Ca 8.6. H/O hypercalcemia while on VDRA.  6.  Nutrition - Renal/Carb mod diet. Nepro and renal vits 7.  DMT2- per primary 8.  H/O Gout  Rita H. Owens Shark, NP-C 01/09/2021, 4:04 PM  [employer] [phone]

## 2021-01-09 NOTE — Plan of Care (Signed)
  Problem: Education: Goal: Knowledge of General Education information will improve Description Including pain rating scale, medication(s)/side effects and non-pharmacologic comfort measures Outcome: Progressing   Problem: Health Behavior/Discharge Planning: Goal: Ability to manage health-related needs will improve Outcome: Progressing   

## 2021-01-09 NOTE — ED Notes (Signed)
Son Amy [name] updated on room assignment and transfer to [facility]

## 2021-01-09 NOTE — Plan of Care (Signed)
  Problem: Education: Goal: Knowledge of disease and its progression will improve Outcome: Completed/Met

## 2021-01-09 NOTE — Consult Note (Addendum)
CONSULT NOTE   MRN : [id number]  Reason for Consult: facial swelling ESRD left AV graft Referring Physician: Dr. Lorin Mercy  History of Present Illness: This is a 75 y.o. female who is s/p left UE AC loop graft on 09/24/20 by Dr. Oneida Alar.   She has been on peritoneal dialysis followed by [facility] placement prior to this.   She states her face has gradually become swollen over the past 2 weeks.  She has noticed waking up from sleep feeling choked.  She did state 2 days ago she ate pork chops without difficulty.       No current facility-administered medications for this encounter.    Pt meds include: Statin :No Betablocker: No ASA: No Other anticoagulants/antiplatelets: none  Past Medical History:  Diagnosis Date  . Chronic kidney disease    dialysis M-W-F - dialysis cath located in right side of chest  . Diabetes mellitus without complication (HCC)    type 2 - no meds   . HLD (hyperlipidemia)    diet controlled  . Hypertension    no meds    Past Surgical History:  Procedure Laterality Date  . AV FISTULA PLACEMENT Left 09/24/2020   Procedure: LEFT UPPER ARM ARTERIOVENOUS GORTEX  GRAFT;  Surgeon: Elam Dutch, MD;  Location: [facility];  Service: Vascular;  Laterality: Left;  . BREAST CYST EXCISION Right 2011   cyst removed -neg  . EYE SURGERY Bilateral    cataracts  . INSERTION OF DIALYSIS CATHETER Right 07/2019  . TUBAL LIGATION      Social History Social History   Tobacco Use  . Smoking status: Former Research scientist (life sciences)  . Smokeless tobacco: Never Used  Vaping Use  . Vaping Use: Never used  Substance Use Topics  . Alcohol use: Yes    Comment: occasional  . Drug use: Not Currently    Types: Marijuana    Comment: Last use 2020    Family History Family History  Problem Relation Age of Onset  . Breast cancer Neg Hx     Allergies  Allergen Reactions  . Sensipar [Cinacalcet] Other (See Comments)    Other reaction(s): Unknown     REVIEW OF SYSTEMS  General: '[ ]'$   Weight loss, '[ ]'$  Fever, '[ ]'$  chills Neurologic: '[ ]'$  Dizziness, '[ ]'$  Blackouts, '[ ]'$  Seizure '[ ]'$  Stroke, '[ ]'$  "Mini stroke", '[ ]'$  Slurred speech, '[ ]'$  Temporary blindness; '[ ]'$  weakness in arms or legs, '[ ]'$  Hoarseness '[ ]'$  Dysphagia Cardiac: '[ ]'$  Chest pain/pressure, '[ ]'$  Shortness of breath at rest '[ ]'$  Shortness of breath with exertion, '[ ]'$  Atrial fibrillation or irregular heartbeat  Vascular: '[ ]'$  Pain in legs with walking, '[ ]'$  Pain in legs at rest, '[ ]'$  Pain in legs at night,  '[ ]'$  Non-healing ulcer, '[ ]'$  Blood clot in vein/DVT,   Pulmonary: '[ ]'$  Home oxygen, '[ ]'$  Productive cough, '[ ]'$  Coughing up blood, '[ ]'$  Asthma,  '[ ]'$  Wheezing '[ ]'$  COPD Musculoskeletal:  '[ ]'$  Arthritis, '[ ]'$  Low back pain, '[ ]'$  Joint pain Hematologic: '[ ]'$  Easy Bruising, '[ ]'$  Anemia; '[ ]'$  Hepatitis Gastrointestinal: '[ ]'$  Blood in stool, '[ ]'$  Gastroesophageal Reflux/heartburn, Urinary: '[ ]'$  chronic Kidney disease, [x ] on HD - '[ ]'$  MWF or '[ ]'$  TTHS, '[ ]'$  Burning with urination, '[ ]'$  Difficulty urinating Skin: '[ ]'$  Rashes, '[ ]'$  Wounds Psychological: '[ ]'$  Anxiety, '[ ]'$  Depression  Physical Examination Vitals:   01/09/21 0900 01/09/21 1100 01/09/21  1153 01/09/21 1257  BP: 133/70 115/65 118/74 129/78  Pulse: 98 95 92 (!) 105  Resp:  '18 18 18  '$ Temp:   98 F (36.7 C) 98.5 F (36.9 C)  TempSrc:   Oral   SpO2: 98% 98% 98% (!) 89%  Weight:    70.6 kg  Height:    '5\' 6"'$  (1.676 m)   Body mass index is 25.13 kg/m.  General:  WDWN in NAD Gait: Normal HENT: WNL Eyes: Pupils equal Pulmonary: normal non-labored breathing , without Rales, rhonchi,  wheezing Cardiac: RRR, without  Murmurs, rubs or gallops; No carotid bruits Abdomen: soft, NT, no masses Skin: no rashes, ulcers noted;  no Gangrene , no cellulitis; no open wounds;   Vascular Exam/Pulses:palpable thrill in graft, right UE motor, sensation intact   Musculoskeletal: no muscle wasting or atrophy;  Neurologic: A&O X 3; Appropriate Affect ;  SENSATION: normal; MOTOR FUNCTION: 5/5  Symmetric Speech is fluent/normal   Significant Diagnostic Studies: CBC Lab Results  Component Value Date   WBC 5.7 01/08/2021   HGB 11.0 (L) 01/08/2021   HCT 34.4 (L) 01/08/2021   MCV 94.8 01/08/2021   PLT 141 (L) 01/08/2021    BMET    Component Value Date/Time   NA 138 01/08/2021 1807   K 3.2 (L) 01/08/2021 1807   K 4.3 08/16/2012 1201   CL 93 (L) 01/08/2021 1807   CO2 32 01/08/2021 1807   GLUCOSE 116 (H) 01/08/2021 1807   BUN 9 01/08/2021 1807   CREATININE 3.68 (H) 01/08/2021 1807   CALCIUM 8.6 (L) 01/08/2021 1807   GFRNONAA 13 (L) 01/08/2021 1807   Estimated Creatinine Clearance: 12.9 mL/min (A) (by C-G formula based on SCr of 3.68 mg/dL (H)).  COAG No results found for: INR, PROTIME   Non-Invasive Vascular Imaging:  Venous Duplex IMPRESSION: 1. 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 2. Remainder of the right upper extremity deep venous system is patent and without evidence for DVT.  CT NECK WITH CONTRAST  IMPRESSION: 1. Diffuse soft tissue stranding throughout the subcutaneous fat of the neck to involve the submental region as well as the bilateral masticator, parapharyngeal, and carotid spaces, with inferior extension along the anterior neck towards the upper anterior chest wall. Findings are nonspecific, but could reflect an acute inflammatory response/angio edema. Possible infection/cellulitis would be the primary differential consideration. No loculated or drainable fluid collections. 2. Associated layering retropharyngeal effusion, likely reactive. Secondary narrowing of the supraglottic airway. 3. 5 mm left upper lobe pulmonary nodule, partially visualized, indeterminate. No follow-up needed if patient is low-risk. Non-contrast chest CT can be considered in 12 months if patient is high-risk. This recommendation follows the consensus  statement: Guidelines for Management of Incidental Pulmonary Nodules Detected on CT Images: From the Fleischner Society 2017; Radiology 2017; 284:228-243.  CTA chest IMPRESSION: 1. Slight narrowing at the brachiocephalic SVC confluence, partially obscured by dense IV contrast in the right brachiocephalic vein. No distal SVC narrowing or evidence of SVC thrombus. 2. Elevation of the right hemidiaphragm with adjacent compressive atelectasis or scarring in the right middle and lower lobes. 3. Bandlike opacity in the dependent left lower lobe, may be atelectasis or scarring, but technically indeterminate.    ASSESSMENT/PLAN:  ESRD with left UE AV graft Facial edema and left UE edema central venous stenosis Plan for venogram AV graft with possible intervention left UE. Face noted to be swollen.  Face appears symmetrical, speech is  clear, no drooling.  States she has not food other than crackers yesterday.     [name] 01/09/2021 2:47 PM  I have independently interviewed and examined patient and and reviewed relevant imaging and agree with PA assessment and plan above.  She does have swelling of her left upper extremity as well as her face unsure if this is related to central venous stenosis as CT contrast somewhat obscures any central issues.  Given pulsatility in the graft we will plan for fistulogram with possible intervention tomorrow in the Cath Lab.  N.p.o. past midnight.  Brandon C. Donzetta Matters, MD [facility] Office: [phone] Pager: [phone]

## 2021-01-09 NOTE — H&P (Signed)
History and Physical    Amy [name] ZOX:[id number] DOB: 10-29-1945 DOA: 01/08/2021  PCP: [name], MD Consultants:  Oneida Alar - vascular;  Patient coming from:  Home; NOK: [place], [place], [phone]  Chief Complaint: Angioedema  HPI: Amy [name] is a 75 y.o. female with medical history significant of HTN; HLD; DM; and ESRD on MWF HD presenting with recurrent head and neck edema.  She had LUE graft placement on 09/24/20; she started using the graft in January and had TDC pulled.  Shortly thereafter, she noticed swelling in her face and was seen by her PCP on 2/15.  She was sent from HD yesterday due to this ongoing edema in face and neck.  She does notice some dysphagia.  She also feels like there is a lot of sinus congestion causing her voice to sound raspy.  In the ED, she had hypoxia while sleeping and O2 sats upon arrival at [facility] were 89%.      ED Course:  [facility] to [facility] transfer, per Dr. Clearence Ped:  75 year old female with hx of ESRD on HD. Chest wall cath removed a few months ago. Has been using left arm graft since then. Last 1 month has had angio-edema, with edema from midneck, up. CT was done - with contrast - no etiology identified. Radiology rec's interventional radiology or vascular to eval. CT does show retropharyngeal edema. Patient reports difficulty swallowing foods, but no difficulty breathing. Symptoms started 2 weeks prior to starting lisinopril and sensipar per pt report. Accepted to med surg [place] bed. Will need dialysis (given contrast for CT). Last dialyzed morning 3/16.   Review of Systems: As per HPI; otherwise review of systems reviewed and negative.    COVID Vaccine Status:  Complete - J&J  Past Medical History:  Diagnosis Date  . Chronic kidney disease    dialysis M-W-F - dialysis cath located in right side of chest  . Diabetes mellitus without complication (HCC)    type 2 - no meds   . HLD (hyperlipidemia)    diet controlled  .  Hypertension    no meds    Past Surgical History:  Procedure Laterality Date  . AV FISTULA PLACEMENT Left 09/24/2020   Procedure: LEFT UPPER ARM ARTERIOVENOUS GORTEX  GRAFT;  Surgeon: Elam Dutch, MD;  Location: [place];  Service: Vascular;  Laterality: Left;  . BREAST CYST EXCISION Right 2011   cyst removed -neg  . EYE SURGERY Bilateral    cataracts  . INSERTION OF DIALYSIS CATHETER Right 07/2019  . TUBAL LIGATION      Social History   Socioeconomic History  . Marital status: Single    Spouse name: Not on file  . Number of children: Not on file  . Years of education: Not on file  . Highest education level: Not on file  Occupational History  . Not on file  Tobacco Use  . Smoking status: Former Research scientist (life sciences)  . Smokeless tobacco: Never Used  Vaping Use  . Vaping Use: Never used  Substance and Sexual Activity  . Alcohol use: Yes    Comment: occasional  . Drug use: Not Currently    Types: Marijuana    Comment: Last use 2020  . Sexual activity: Not on file    Comment: Tubal  Other Topics Concern  . Not on file  Social History Narrative  . Not on file   Social Determinants of Health   Financial Resource Strain: Low Risk   . Difficulty  of Paying Living Expenses: Not hard at all  Food Insecurity: No Food Insecurity  . Worried About Charity fundraiser in the Last Year: Never true  . Ran Out of Food in the Last Year: Never true  Transportation Needs: No Transportation Needs  . Lack of Transportation (Medical): No  . Lack of Transportation (Non-Medical): No  Physical Activity: Inactive  . Days of Exercise per Week: 0 days  . Minutes of Exercise per Session: 0 min  Stress: No Stress Concern Present  . Feeling of Stress : Not at all  Social Connections: Moderately Integrated  . Frequency of Communication with Friends and Family: More than three times a week  . Frequency of Social Gatherings with Friends and Family: Never  . Attends Religious Services: More than 4 times  per year  . Active Member of Clubs or Organizations: Yes  . Attends Archivist Meetings: More than 4 times per year  . Marital Status: Never married  Intimate Partner Violence: Not At Risk  . Fear of Current or Ex-Partner: No  . Emotionally Abused: No  . Physically Abused: No  . Sexually Abused: No    Allergies  Allergen Reactions  . Sensipar [Cinacalcet] Other (See Comments)    Other reaction(s): Unknown    Family History  Problem Relation Age of Onset  . Breast cancer Neg Hx     Prior to Admission medications   Medication Sig Start Date End Date Taking? Authorizing Provider  blood glucose meter kit and supplies KIT Dispense based on patient and insurance preference. Use up to four times daily as directed. (FOR ICD-9 250.00, 250.01). 06/10/20   [name], MD  calcium acetate (PHOSLO) 667 MG capsule Take 1,334 mg by mouth 3 (three) times daily.  01/02/20   [provider]  Continuous Blood Gluc Sensor (FREESTYLE LIBRE 14 DAY SENSOR) MISC 1 Units by Does not apply route every 14 (fourteen) days. 05/23/20   Nche, Charlene Brooke, NP  diclofenac Sodium (VOLTAREN) 1 % GEL Apply 4 g topically 4 (four) times daily. 12/10/20   Haydee Salter, MD  Methoxy PEG-Epoetin Beta (MIRCERA IJ) Mircera 02/12/20 02/10/21  [provider]    Physical Exam: Vitals:   01/09/21 1100 01/09/21 1153 01/09/21 1257 01/09/21 1730  BP: 115/65 118/74 129/78 138/63  Pulse: 95 92 (!) 105 (!) 102  Resp: 18 18 18 18   Temp:  98 F (36.7 C) 98.5 F (36.9 C) 97.8 F (36.6 C)  TempSrc:  Oral    SpO2: 98% 98% (!) 89% 95%  Weight:   70.6 kg   Height:   5' 6"  (1.676 m)      . General:  Appears calm and comfortable and is in NAD . Eyes:   EOMI, normal lids, iris . ENT:  grossly normal hearing, lips & tongue, mmm; no apparent tongue edema or airway compromise but there is mild facial edema . Neck:  Mild neck edema without apparent masses . Cardiovascular:  RRR, no m/r/g. No  LE edema.  [name] Respiratory:   CTA bilaterally with no wheezes/rales/rhonchi.  Normal respiratory effort. . Abdomen:  soft, NT, ND . Skin:  no rash or induration seen on limited exam . Musculoskeletal:  grossly normal tone BUE/BLE, good ROM, no bony abnormality; +LUE edema with AV graft in place . Psychiatric:  grossly normal mood and affect, speech fluent and appropriate, AOx3 . Neurologic:  CN 2-12 grossly intact, moves all extremities in coordinated fashion    Radiological  Exams on Admission: Independently reviewed - see discussion in A/P where applicable  CT Soft Tissue Neck W Contrast  Result Date: 01/08/2021 CLINICAL DATA:  Initial evaluation for soft tissue mass, swelling. EXAM: CT NECK WITH CONTRAST TECHNIQUE: Multidetector CT imaging of the neck was performed using the standard protocol following the bolus administration of intravenous contrast. CONTRAST:  50m OMNIPAQUE IOHEXOL 300 MG/ML  SOLN COMPARISON:  None. FINDINGS: Pharynx and larynx: Oral cavity within normal limits no visible acute abnormality about the dentition. Palatine tonsils symmetric and within normal limits. Few small calcified tonsilliths noted on the right. Nasopharynx normal. Layering retropharyngeal effusion seen throughout the retropharyngeal space without discrete retropharyngeal abscess. Epiglottis itself within normal limits. Vallecula largely effaced. Supraglottic airway is narrowed but remains patent. Glottis is closed and not well assessed. Subglottic airway clear. Salivary glands: Salivary glands including the parotid and submandibular glands within normal limits. Thyroid: Few scattered subcentimeter thyroid nodules noted, largest of which measures 4 mm. Findings felt to be of doubtful significance given size and patient age, no follow-up imaging recommended (ref: J Am Coll Radiol. 2015 Feb;12(2): 143-50). Lymph nodes: No pathologically enlarged lymph nodes seen within the neck. Vascular: Normal intravascular  enhancement seen throughout the neck. Limited intracranial: Unremarkable. Visualized orbits: Globes and orbital soft tissues demonstrate no acute finding. Mastoids and visualized paranasal sinuses: Visualized paranasal sinuses are clear. Mastoid air cells and middle ear cavities are well pneumatized and free of fluid. Skeleton: No acute osseous finding. No discrete or worrisome osseous lesions. Bones are somewhat diffusely sclerotic in appearance, suggesting underlying renal osteodystrophy. Upper chest: 5 mm left upper lobe nodule partially visualized (series 3, image 98). Partially visualized lungs are otherwise clear. Visualized upper chest demonstrates no other acute finding. Other: Diffuse soft tissue stranding seen throughout the subcutaneous fat of the submental region, with inferior extension along the anterior neck towards the upper anterior chest wall. Hazy stranding extends laterally to involve the masticator, parapharyngeal, and carotid spaces as well. No extension into the floor of mouth. Findings are nonspecific, but could reflect an acute inflammatory response/angio edema. Possible infection/cellulitis would be the primary differential consideration. No loculated or drainable fluid collections IMPRESSION: 1. Diffuse soft tissue stranding throughout the subcutaneous fat of the neck to involve the submental region as well as the bilateral masticator, parapharyngeal, and carotid spaces, with inferior extension along the anterior neck towards the upper anterior chest wall. Findings are nonspecific, but could reflect an acute inflammatory response/angio edema. Possible infection/cellulitis would be the primary differential consideration. No loculated or drainable fluid collections. 2. Associated layering retropharyngeal effusion, likely reactive. Secondary narrowing of the supraglottic airway. 3. 5 mm left upper lobe pulmonary nodule, partially visualized, indeterminate. No follow-up needed if patient is  low-risk. Non-contrast chest CT can be considered in 12 months if patient is high-risk. This recommendation follows the consensus statement: Guidelines for Management of Incidental Pulmonary Nodules Detected on CT Images: From the Fleischner Society 2017; Radiology 2017; 284:228-243. Electronically Signed   By: BJeannine BogaM.D.   On: 01/08/2021 23:03   CT CHEST W CONTRAST  Result Date: 01/08/2021 CLINICAL DATA:  Facial swelling. EXAM: CT CHEST WITH CONTRAST TECHNIQUE: Multidetector CT imaging of the chest was performed during intravenous contrast administration. Right arm contrast injection. CONTRAST:  749mOMNIPAQUE IOHEXOL 300 MG/ML  SOLN COMPARISON:  Right upper extremity duplex earlier today. FINDINGS: Cardiovascular: No upper extremity central venous catheters on the current exam. Dense IV contrast within the right subclavian vein. Left subclavian vein is patent.  Slight narrowing at the brachiocephalic SVC confluence, partially obscured by dense IV contrast in the right brachiocephalic vein, series 6 image 96. No evidence of SVC thrombus. No SVC narrowing. Heart is normal in size. No pericardial fluid. Cannot assess for pulmonary embolus given phase of contrast. The thoracic aorta is normal in caliber. The left vertebral artery arises directly from the thoracic aorta. Mild aortic atherosclerosis. Mediastinum/Nodes: No enlarged mediastinal or hilar lymph nodes. No suspicious thyroid nodule. No esophageal wall thickening. Lungs/Pleura: Elevation of the right hemidiaphragm with adjacent compressive atelectasis or scarring in the right middle and lower lobes. Bandlike opacity in the dependent left lower lobe, may be atelectasis or scarring, but technically indeterminate. No pleural fluid. The trachea and central bronchi are patent. Upper Abdomen: Renal atrophy consistent with known chronic renal disease. No acute upper abdominal findings. Musculoskeletal: Mild generalized subcutaneous edema.  Ground-glass densities throughout the osseous structures consistent with renal osteodystrophy. No acute osseous abnormalities are seen. Advanced left shoulder degenerative change. IMPRESSION: 1. Slight narrowing at the brachiocephalic SVC confluence, partially obscured by dense IV contrast in the right brachiocephalic vein. No distal SVC narrowing or evidence of SVC thrombus. 2. Elevation of the right hemidiaphragm with adjacent compressive atelectasis or scarring in the right middle and lower lobes. 3. Bandlike opacity in the dependent left lower lobe, may be atelectasis or scarring, but technically indeterminate. Aortic Atherosclerosis (ICD10-I70.0). Electronically Signed   By: Keith Rake M.D.   On: 01/08/2021 21:43   US Venous Img Upper Uni Right(DVT)  Result Date: 01/08/2021 CLINICAL DATA:  Swelling EXAM: Right UPPER EXTREMITY VENOUS DOPPLER ULTRASOUND TECHNIQUE: Gray-scale sonography with graded compression, as well as color Doppler and duplex ultrasound were performed to evaluate the upper extremity deep venous system from the level of the subclavian vein and including the jugular, axillary, basilic, radial, ulnar and upper cephalic vein. Spectral Doppler was utilized to evaluate flow at rest and with distal augmentation maneuvers. COMPARISON:  None. FINDINGS: Contralateral Subclavian Vein:  Not image Internal Jugular Vein: Narrowed appearing with eccentric thrombus/wall thickening. Color flow present. Subclavian Vein: No evidence of thrombus. Normal compressibility, respiratory phasicity and response to augmentation. Axillary Vein: No evidence of thrombus. Normal compressibility, respiratory phasicity and response to augmentation. Cephalic Vein: No evidence of thrombus. Normal compressibility, respiratory phasicity and response to augmentation. Basilic Vein: No evidence of thrombus. Normal compressibility, respiratory phasicity and response to augmentation. Brachial Veins: No evidence of thrombus.  Normal compressibility, respiratory phasicity and response to augmentation. Radial Veins: No evidence of thrombus. Normal compressibility, respiratory phasicity and response to augmentation. Ulnar Veins: No evidence of thrombus. Normal compressibility, respiratory phasicity and response to augmentation. Venous Reflux:  None visualized. Other Findings:  None visualized. IMPRESSION: 1. 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 2. Remainder of the right upper extremity deep venous system is patent and without evidence for DVT. Electronically Signed   By: Donavan Foil M.D.   On: 01/08/2021 19:55    EKG: not done   Labs on Admission: I have personally reviewed the available labs and imaging studies at the time of the admission.  Pertinent labs from 3/16:   K+ 3.2 Glucose 116 BUN 9/Creatinine 3.68/GFR 13 WBC 5.7 Hgb 11.0 COVID/flu negative   Assessment/Plan Principal Problem:   Angio-edema Active Problems:   Essential hypertension   Type 2 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, with long-term current use of insulin (HCC)   End stage renal  disease ([place])   Dyslipidemia    Angioedema -Patient with recurrent face/neck edema since her [facility] was removed about 1 month ago -?SVC stenosis - likely need for shuntogram -AV graft angiogram is pending by vascular surgery -Has clot in R IJ on imaging -Will observe for now -She will go to the cath lab tomorrow with vascular -mild dysphagia, may benefit from swallow evaluation depending on clinical course -Continue Phoslo -Sensipar was continued but has been stopped by consultant  ESRD on HD -Patient on chronic MWF HD -Nephrology prn order set utilized -She does not appear to be volume overloaded or otherwise in need of acute HD -Nephrology notified that patient will need HD -Receives Mircera through HD  HTN -No longer  taking medications  DM -No longer taking medications for this issue -Glucose minimally elevated -She is listed as having a continuous glucose monitor but this seems unnecessary, would consider d/c  HLD -She does not appear to be taking medications for this issue at this time    Note: This patient has been tested and is negative for the novel coronavirus COVID-19. The patient has been fully vaccinated against COVID-19 (J&J).   Level of care: Med-Surg DVT prophylaxis: Heparin Code Status:  Full - confirmed with patient Family Communication: None present Disposition Plan:  The patient is from: home  Anticipated d/c is to: home without [facility] services   Anticipated d/c date will depend on clinical response to treatment, but possibly as early as tomorrow if she has excellent response to treatment  Patient is currently: acutely ill Consults called: Nephrology; Vascular surgery; IR (telephone only); Nutrition Admission status:  It is my clinical opinion that referral for OBSERVATION is reasonable and necessary in this patient based on the above information provided. The aforementioned taken together are felt to place the patient at high risk for further clinical deterioration. However it is anticipated that the patient may be medically stable for discharge from the hospital within 24 to 48 hours.    Karmen Bongo MD [facility]   How to contact the [facility] Attending or Consulting provider [place] or covering provider during after hours [place], for this patient?  1. Check the care team in [facility] and look for a) attending/consulting [facility] provider listed and b) the [facility] team listed 2. Log into [email or web address] and use [place]'s universal password to access. If you do not have the password, please contact the hospital operator. 3. Locate the [facility] provider you are looking for under [facility] and page to a number that you can be directly reached. 4. If you still have difficulty reaching  the provider, please page the [facility] (Director on Call) for the Hospitalists listed on amion for assistance.   01/09/2021, 6:30 PM

## 2021-01-09 NOTE — ED Provider Notes (Signed)
Patient is awaiting admission. While asleep, she developed significant hypoxia, down into the 60s. Easily came up after being awoken. No distress or increased WOB. She denies any know history of sleep apnea or use of CPAP. Will place on O2, but may need workup for this, as it only appear to occur while asleep.   Sherwood Gambler, MD 01/09/21 [phone]

## 2021-01-10 ENCOUNTER — Inpatient Hospital Stay (HOSPITAL_COMMUNITY)
Admission: EM | Disposition: A | Discharge status: Home / Self Care | Payer: Self-pay | Source: Home / Self Care | Attending: Internal Medicine

## 2021-01-10 ENCOUNTER — Encounter (HOSPITAL_COMMUNITY): Payer: Self-pay | Admitting: Vascular Surgery

## 2021-01-10 DIAGNOSIS — Z87891 Personal history of nicotine dependence: Secondary | ICD-10-CM | POA: Diagnosis not present

## 2021-01-10 DIAGNOSIS — Z992 Dependence on renal dialysis: Secondary | ICD-10-CM | POA: Diagnosis not present

## 2021-01-10 DIAGNOSIS — Z20822 Contact with and (suspected) exposure to covid-19: Secondary | ICD-10-CM | POA: Diagnosis present

## 2021-01-10 DIAGNOSIS — Z79899 Other long term (current) drug therapy: Secondary | ICD-10-CM | POA: Diagnosis not present

## 2021-01-10 DIAGNOSIS — D631 Anemia in chronic kidney disease: Secondary | ICD-10-CM | POA: Diagnosis present

## 2021-01-10 DIAGNOSIS — N186 End stage renal disease: Secondary | ICD-10-CM | POA: Diagnosis present

## 2021-01-10 DIAGNOSIS — T783XXA Angioneurotic edema, initial encounter: Secondary | ICD-10-CM | POA: Diagnosis present

## 2021-01-10 DIAGNOSIS — Z888 Allergy status to other drugs, medicaments and biological substances status: Secondary | ICD-10-CM | POA: Diagnosis not present

## 2021-01-10 DIAGNOSIS — E785 Hyperlipidemia, unspecified: Secondary | ICD-10-CM | POA: Diagnosis present

## 2021-01-10 DIAGNOSIS — N2581 Secondary hyperparathyroidism of renal origin: Secondary | ICD-10-CM | POA: Diagnosis present

## 2021-01-10 DIAGNOSIS — R609 Edema, unspecified: Secondary | ICD-10-CM | POA: Diagnosis present

## 2021-01-10 DIAGNOSIS — R221 Localized swelling, mass and lump, neck: Secondary | ICD-10-CM | POA: Diagnosis present

## 2021-01-10 DIAGNOSIS — Z794 Long term (current) use of insulin: Secondary | ICD-10-CM | POA: Diagnosis not present

## 2021-01-10 DIAGNOSIS — I871 Compression of vein: Secondary | ICD-10-CM | POA: Diagnosis present

## 2021-01-10 DIAGNOSIS — T82898A Other specified complication of vascular prosthetic devices, implants and grafts, initial encounter: Secondary | ICD-10-CM | POA: Diagnosis not present

## 2021-01-10 DIAGNOSIS — E1122 Type 2 diabetes mellitus with diabetic chronic kidney disease: Secondary | ICD-10-CM | POA: Diagnosis present

## 2021-01-10 DIAGNOSIS — R1310 Dysphagia, unspecified: Secondary | ICD-10-CM | POA: Diagnosis present

## 2021-01-10 DIAGNOSIS — I120 Hypertensive chronic kidney disease with stage 5 chronic kidney disease or end stage renal disease: Secondary | ICD-10-CM | POA: Diagnosis present

## 2021-01-10 HISTORY — PX: PERIPHERAL VASCULAR BALLOON ANGIOPLASTY: CATH118281

## 2021-01-10 HISTORY — PX: A/V FISTULAGRAM: CATH118298

## 2021-01-10 LAB — GLUCOSE, CAPILLARY
Glucose-Capillary: 160 mg/dL — ABNORMAL HIGH (ref 70–99)
Glucose-Capillary: 156 mg/dL — ABNORMAL HIGH (ref 70–99)
Glucose-Capillary: 195 mg/dL — ABNORMAL HIGH (ref 70–99)
Glucose-Capillary: 230 mg/dL — ABNORMAL HIGH (ref 70–99)

## 2021-01-10 LAB — BASIC METABOLIC PANEL
Anion gap: 12 (ref 5–15)
BUN: 20 mg/dL (ref 8–23)
CO2: 30 mmol/L (ref 22–32)
Calcium: 9.2 mg/dL (ref 8.9–10.3)
Chloride: 93 mmol/L — ABNORMAL LOW (ref 98–111)
Creatinine, Ser: 6.37 mg/dL — ABNORMAL HIGH (ref 0.44–1.00)
GFR, Estimated: 6 mL/min — ABNORMAL LOW (ref >60)
Glucose, Bld: 161 mg/dL — ABNORMAL HIGH (ref 70–99)
Potassium: 3.4 mmol/L — ABNORMAL LOW (ref 3.5–5.1)
Sodium: 135 mmol/L (ref 135–145)

## 2021-01-10 LAB — CBC
HCT: 34.2 % — ABNORMAL LOW (ref 36.0–46.0)
Hemoglobin: 10.9 g/dL — ABNORMAL LOW (ref 12.0–15.0)
MCH: 30.3 pg (ref 26.0–34.0)
MCHC: 31.9 g/dL (ref 30.0–36.0)
MCV: 95 fL (ref 80.0–100.0)
Platelets: 141 10*3/uL — ABNORMAL LOW (ref 150–400)
RBC: 3.6 MIL/uL — ABNORMAL LOW (ref 3.87–5.11)
RDW: 14.9 % (ref 11.5–15.5)
WBC: 6 10*3/uL (ref 4.0–10.5)
nRBC: 0 % (ref 0.0–0.2)

## 2021-01-10 SURGERY — A/V FISTULAGRAM
Anesthesia: LOCAL | Laterality: Left

## 2021-01-10 MED ORDER — HEPARIN SODIUM (PORCINE) 1000 UNIT/ML DIALYSIS: 3500.0000 [IU] | Freq: Once | INTRAMUSCULAR | Status: DC

## 2021-01-10 MED ORDER — SODIUM CHLORIDE 0.9 % IV SOLN: 100.0000 mL | INTRAVENOUS | Status: DC | PRN

## 2021-01-10 MED ORDER — INSULIN ASPART 100 UNIT/ML ~~LOC~~ SOLN
0.0000 [IU] | Freq: Three times a day (TID) | SUBCUTANEOUS | Status: DC
  Administered 2021-01-11: 2 [IU] via SUBCUTANEOUS

## 2021-01-10 MED ORDER — HEPARIN SODIUM (PORCINE) 1000 UNIT/ML IJ SOLN
INTRAMUSCULAR | Status: DC | PRN
  Administered 2021-01-10: 2000 [IU] via INTRAVENOUS

## 2021-01-10 MED ORDER — IODIXANOL 320 MG/ML IV SOLN
INTRAVENOUS | Status: DC | PRN
  Administered 2021-01-10: 40 mL

## 2021-01-10 MED ORDER — LIDOCAINE HCL (PF) 1 % IJ SOLN
INTRAMUSCULAR | Status: AC
  Filled 2021-01-10: qty 30

## 2021-01-10 MED ORDER — INSULIN ASPART 100 UNIT/ML ~~LOC~~ SOLN
0.0000 [IU] | Freq: Every day | SUBCUTANEOUS | Status: DC
  Administered 2021-01-10: 2 [IU] via SUBCUTANEOUS

## 2021-01-10 MED ORDER — FENTANYL CITRATE (PF) 100 MCG/2ML IJ SOLN
INTRAMUSCULAR | Status: DC | PRN
  Administered 2021-01-10: 25 ug via INTRAVENOUS

## 2021-01-10 MED ORDER — HEPARIN (PORCINE) IN NACL 1000-0.9 UT/500ML-% IV SOLN
INTRAVENOUS | Status: DC | PRN
  Administered 2021-01-10: 500 mL

## 2021-01-10 MED ORDER — LIDOCAINE HCL (PF) 1 % IJ SOLN: 5.0000 mL | INTRAMUSCULAR | Status: DC | PRN

## 2021-01-10 MED ORDER — FENTANYL CITRATE (PF) 100 MCG/2ML IJ SOLN
INTRAMUSCULAR | Status: AC
  Filled 2021-01-10: qty 2

## 2021-01-10 MED ORDER — HEPARIN (PORCINE) IN NACL 1000-0.9 UT/500ML-% IV SOLN
INTRAVENOUS | Status: AC
  Filled 2021-01-10: qty 500

## 2021-01-10 MED ORDER — LIDOCAINE HCL (PF) 1 % IJ SOLN
INTRAMUSCULAR | Status: DC | PRN
  Administered 2021-01-10: 4 mL

## 2021-01-10 MED ORDER — MIDAZOLAM HCL 5 MG/5ML IJ SOLN
INTRAMUSCULAR | Status: AC
  Filled 2021-01-10: qty 5

## 2021-01-10 MED ORDER — HEPARIN SODIUM (PORCINE) 1000 UNIT/ML IJ SOLN
INTRAMUSCULAR | Status: AC
  Filled 2021-01-10: qty 1

## 2021-01-10 MED ORDER — PENTAFLUOROPROP-TETRAFLUOROETH EX AERO: 1.0000 "application " | INHALATION_SPRAY | CUTANEOUS | Status: DC | PRN

## 2021-01-10 MED ORDER — LIDOCAINE-PRILOCAINE 2.5-2.5 % EX CREA: 1.0000 "application " | TOPICAL_CREAM | CUTANEOUS | Status: DC | PRN

## 2021-01-10 MED ORDER — ONDANSETRON HCL 4 MG/2ML IJ SOLN
INTRAMUSCULAR | Status: AC
  Filled 2021-01-10: qty 2

## 2021-01-10 MED ORDER — MIDAZOLAM HCL 2 MG/2ML IJ SOLN
INTRAMUSCULAR | Status: DC | PRN
  Administered 2021-01-10: 1 mg via INTRAVENOUS

## 2021-01-10 MED ORDER — ONDANSETRON HCL 4 MG/2ML IJ SOLN
INTRAMUSCULAR | Status: DC | PRN
  Administered 2021-01-10: 4 mg via INTRAVENOUS

## 2021-01-10 MED ORDER — RENA-VITE PO TABS: 1.0000 | ORAL_TABLET | Freq: Every day | ORAL | Status: DC

## 2021-01-10 MED ORDER — HEPARIN (PORCINE) IN NACL 1000-0.9 UT/500ML-% IV SOLN
INTRAVENOUS | Status: AC
  Filled 2021-01-10: qty 1000

## 2021-01-10 SURGICAL SUPPLY — 18 items
BAG SNAP BAND KOVER 36X36 (MISCELLANEOUS) ×4 IMPLANT
BALLN LUTONIX AV 8X40X75 (BALLOONS) ×3
BALLN MUSTANG 6.0X40 75 (BALLOONS) ×3
BALLN MUSTANG 8.0X40 75 (BALLOONS) ×3
BALLOON LUTONIX AV 8X40X75 (BALLOONS) IMPLANT
BALLOON MUSTANG 6.0X40 75 (BALLOONS) IMPLANT
BALLOON MUSTANG 8.0X40 75 (BALLOONS) IMPLANT
COVER DOME SNAP 22 D (MISCELLANEOUS) ×4 IMPLANT
KIT ENCORE 26 ADVANTAGE (KITS) ×1 IMPLANT
KIT MICROPUNCTURE NIT STIFF (SHEATH) ×1 IMPLANT
PROTECTION STATION PRESSURIZED (MISCELLANEOUS) ×3
SHEATH PINNACLE R/O II 6F 4CM (SHEATH) ×1 IMPLANT
SHEATH PROBE COVER 6X72 (BAG) ×4 IMPLANT
STATION PROTECTION PRESSURIZED (MISCELLANEOUS) ×2 IMPLANT
STOPCOCK MORSE 400PSI 3WAY (MISCELLANEOUS) ×3 IMPLANT
TRAY PV CATH (CUSTOM PROCEDURE TRAY) ×3 IMPLANT
TUBING CIL FLEX 10 FLL-RA (TUBING) ×3 IMPLANT
WIRE BENTSON .035X145CM (WIRE) ×1 IMPLANT

## 2021-01-10 NOTE — Interval H&P Note (Signed)
History and Physical Interval Note:  01/10/2021 9:31 AM  Amy [name]  has presented today for surgery, with the diagnosis of swelling.  The various methods of treatment have been discussed with the patient and family. After consideration of risks, benefits and other options for treatment, the patient has consented to  Procedure(s): A/V FISTULAGRAM (Left) as a surgical intervention.  The patient's history has been reviewed, patient examined, no change in status, stable for surgery.  I have reviewed the patient's chart and labs.  Questions were answered to the patient's satisfaction.     [name]

## 2021-01-10 NOTE — Interval H&P Note (Signed)
History and Physical Interval Note:  01/10/2021 9:34 AM  Amy [name]  has presented today for surgery, with the diagnosis of swelling.  The various methods of treatment have been discussed with the patient and family. After consideration of risks, benefits and other options for treatment, the patient has consented to  Procedure(s): A/V FISTULAGRAM (Left) as a surgical intervention.  The patient's history has been reviewed, patient examined, no change in status, stable for surgery.  I have reviewed the patient's chart and labs.  Questions were answered to the patient's satisfaction.     [name]

## 2021-01-10 NOTE — Plan of Care (Signed)
Problem: Education: Goal: Knowledge of General Education information will improve Description: Including pain rating scale, medication(s)/side effects and non-pharmacologic comfort measures Outcome: Completed/Met   Problem: Education: Goal: Knowledge of General Education information will improve Description: Including pain rating scale, medication(s)/side effects and non-pharmacologic comfort measures Outcome: Completed/Met   Problem: Health Behavior/Discharge Planning: Goal: Ability to manage health-related needs will improve Outcome: Completed/Met   Problem: Clinical Measurements: Goal: Will remain free from infection Outcome: Completed/Met Goal: Respiratory complications will improve Outcome: Completed/Met Goal: Cardiovascular complication will be avoided Outcome: Completed/Met   Problem: Nutrition: Goal: Adequate nutrition will be maintained Outcome: Completed/Met   Problem: Coping: Goal: Level of anxiety will decrease Outcome: Completed/Met   Problem: Elimination: Goal: Will not experience complications related to bowel motility Outcome: Completed/Met Goal: Will not experience complications related to urinary retention Outcome: Completed/Met   Problem: Pain Managment: Goal: General experience of comfort will improve Outcome: Completed/Met   Problem: Fluid Volume: Goal: Compliance with measures to maintain balanced fluid volume will improve Outcome: Completed/Met   Problem: Health Behavior/Discharge Planning: Goal: Ability to manage health-related needs will improve Outcome: Completed/Met   Problem: Nutritional: Goal: Ability to make healthy dietary choices will improve Outcome: Completed/Met   Problem: Clinical Measurements: Goal: Complications related to the disease process, condition or treatment will be avoided or minimized Outcome: Completed/Met

## 2021-01-10 NOTE — Progress Notes (Signed)
PROGRESS NOTE    Amy [name]  QIH:[id number] DOB: 1946-08-23 DOA: 01/08/2021 PCP: [name], MD    Brief Narrative:  75 year old female who is on hemodialysis for ESRD and recently started using the left upper extremity AV fistula about a month, hypertension, hyperlipidemia, type 2 diabetes presented to the hospital with recurrent head and neck edema.  Patient is started developing some swelling of her face and neck since 2/15.  Occasional dysphagia but no airway obstruction. Left upper extremity AV fistula working well.  Symptoms started after starting on hemodialysis through the fistula. Never been on any ACE or ARB. Adequate fluid removed.  No evidence of localizing infection.   Assessment & Plan:   Principal Problem:   Angio-edema Active Problems:   Essential hypertension   Type 2 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, with long-term current use of insulin (HCC)   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HCC)   Dyslipidemia  Angioedema/facial swelling in the distribution of the SVC: CT scan consistent with diffuse swelling and edema, no localized infection or abscess, patient with no evidence of airway obstruction. Less likely medication causing any angioedema as mouth and oropharynx is spared. Symptoms started after using left AV graft, underwent fistulogram today that was negative for any central venous stenosis, AV graft is widely patent.  70% stenosis at the venous anastomosis that was inflated with drug-coated angioplasty.  ESRD on hemodialysis: Getting dialysis on a schedule.  Next Alysis today.  Followed by nephrology.  Hypertension: With history of hypertension.  Off all antihypertensives.  Type 2 diabetes: Diet controlled.  Not on treatment.  Patient has significant swelling of the head and neck, will need further investigations and monitoring in the hospital to ensure stabilization.     DVT prophylaxis: heparin injection 5,000 Units  Start: 01/09/21 1600   Code Status: Full code Family Communication: None at the bedside Disposition Plan: Status is: Observation  The patient will require care spanning > 2 midnights and should be moved to inpatient because: Ongoing diagnostic testing needed not appropriate for outpatient work up  Dispo: The patient is from: Home              Anticipated d/c is to: Home              Patient currently is not medically stable to d/c.   Difficult to place patient No         Consultants:   Nephrology  Vascular surgery  Procedures:   Fistulogram  Antimicrobials:   None   Subjective: Patient seen and examined.  No overnight events.  I saw her in the morning rounds.  She denied any shortness of breath or difficulty swallowing.  Objective: Vitals:   01/09/21 2014 01/10/21 0536 01/10/21 0926 01/10/21 0958  BP: (!) 141/61 (!) 122/58 119/72   Pulse: 95 91 94   Resp: 20 20 18    Temp: 97.7 F (36.5 C) 97.8 F (36.6 C) 98.2 F (36.8 C)   TempSrc:  Oral    SpO2: 99% 95% 95% 100%  Weight:      Height:        Intake/Output Summary (Last 24 hours) at 01/10/2021 1136 Last data filed at 01/10/2021 0831 Gross per 24 hour  Intake 480 ml  Output 0 ml  Net 480 ml   Filed Weights   01/08/21 1648 01/09/21 1257 01/09/21 2000  Weight: 71 kg 70.6 kg 70.7 kg    Examination:  General exam: Appears calm and comfortable  Sleeping in bed. Respiratory system: Clear to auscultation. Respiratory effort normal.  No added sounds. Mild facial edema.  Oropharynx looks normal. Left upper extremity is edematous.  AV fistula present with thrill. Cardiovascular system: S1 & S2 heard, RRR.  Left upper arm swelling and erythema. Gastrointestinal system: Abdomen is nondistended, soft and nontender. No organomegaly or masses felt. Normal bowel sounds heard. Central nervous system: Alert and oriented. No focal neurological deficits. Extremities: Symmetric 5 x 5 power.   Data Reviewed: I  have personally reviewed following labs and imaging studies  CBC: Recent Labs  Lab 01/08/21 1807 01/10/21 0247  WBC 5.7 6.0  HGB 11.0* 10.9*  HCT 34.4* 34.2*  MCV 94.8 95.0  PLT 141* 329*   Basic Metabolic Panel: Recent Labs  Lab 01/08/21 1807 01/10/21 0247  NA 138 135  K 3.2* 3.4*  CL 93* 93*  CO2 32 30  GLUCOSE 116* 161*  BUN 9 20  CREATININE 3.68* 6.37*  CALCIUM 8.6* 9.2   GFR: Estimated Creatinine Clearance: 7.5 mL/min (A) (by C-G formula based on SCr of 6.37 mg/dL (H)). Liver Function Tests: No results for input(s): AST, ALT, ALKPHOS, BILITOT, PROT, ALBUMIN in the last 168 hours. No results for input(s): LIPASE, AMYLASE in the last 168 hours. No results for input(s): AMMONIA in the last 168 hours. Coagulation Profile: No results for input(s): INR, PROTIME in the last 168 hours. Cardiac Enzymes: No results for input(s): CKTOTAL, CKMB, CKMBINDEX, TROPONINI in the last 168 hours. BNP (last 3 results) No results for input(s): PROBNP in the last 8760 hours. HbA1C: No results for input(s): HGBA1C in the last 72 hours. CBG: Recent Labs  Lab 01/09/21 2017 01/10/21 0636  GLUCAP 218* 160*   Lipid Profile: No results for input(s): CHOL, HDL, LDLCALC, TRIG, CHOLHDL, LDLDIRECT in the last 72 hours. Thyroid Function Tests: No results for input(s): TSH, T4TOTAL, FREET4, T3FREE, THYROIDAB in the last 72 hours. Anemia Panel: No results for input(s): VITAMINB12, FOLATE, FERRITIN, TIBC, IRON, RETICCTPCT in the last 72 hours. Sepsis Labs: No results for input(s): PROCALCITON, LATICACIDVEN in the last 168 hours.  Recent Results (from the past 240 hour(s))  Resp Panel by RT-PCR (Flu A&B, Covid) Nasopharyngeal Swab     Status: None   Collection Time: 01/08/21 11:57 PM   Specimen: Nasopharyngeal Swab; Nasopharyngeal(NP) swabs in vial transport medium  Result Value Ref Range Status   SARS Coronavirus 2 by RT PCR NEGATIVE NEGATIVE Final    Comment: (NOTE) SARS-CoV-2 target  nucleic acids are NOT DETECTED.  The SARS-CoV-2 RNA is generally detectable in upper respiratory specimens during the acute phase of infection. The lowest concentration of SARS-CoV-2 viral copies this assay can detect is 138 copies/mL. A negative result does not preclude SARS-Cov-2 infection and should not be used as the sole basis for treatment or other patient management decisions. A negative result may occur with  improper specimen collection/handling, submission of specimen other than nasopharyngeal swab, presence of viral mutation(s) within the areas targeted by this assay, and inadequate number of viral copies(<138 copies/mL). A negative result must be combined with clinical observations, patient history, and epidemiological information. The expected result is Negative.  Fact Sheet for Patients:  EntrepreneurPulse.com.au  Fact Sheet for Healthcare Providers:  IncredibleEmployment.be  This test is no t yet approved or cleared by the [place] FDA and  has been authorized for detection and/or diagnosis of SARS-CoV-2 by FDA under an Emergency Use Authorization (EUA). This EUA will remain  in effect (meaning this test can  be used) for the duration of the COVID-19 declaration under Section 564(b)(1) of the Act, 21 U.S.C.section 360bbb-3(b)(1), unless the authorization is terminated  or revoked sooner.       Influenza A by PCR NEGATIVE NEGATIVE Final   Influenza B by PCR NEGATIVE NEGATIVE Final    Comment: (NOTE) The Xpert Xpress SARS-CoV-2/FLU/RSV plus assay is intended as an aid in the diagnosis of influenza from Nasopharyngeal swab specimens and should not be used as a sole basis for treatment. Nasal washings and aspirates are unacceptable for Xpert Xpress SARS-CoV-2/FLU/RSV testing.  Fact Sheet for Patients: EntrepreneurPulse.com.au  Fact Sheet for Healthcare  Providers: IncredibleEmployment.be  This test is not yet approved or cleared by the [place] FDA and has been authorized for detection and/or diagnosis of SARS-CoV-2 by FDA under an Emergency Use Authorization (EUA). This EUA will remain in effect (meaning this test can be used) for the duration of the COVID-19 declaration under Section 564(b)(1) of the Act, 21 U.S.C. section 360bbb-3(b)(1), unless the authorization is terminated or revoked.  Performed at [facility], [address], [place], [place] [address]          Radiology Studies: CT Soft Tissue Neck W Contrast  Result Date: 01/08/2021 CLINICAL DATA:  Initial evaluation for soft tissue mass, swelling. EXAM: CT NECK WITH CONTRAST TECHNIQUE: Multidetector CT imaging of the neck was performed using the standard protocol following the bolus administration of intravenous contrast. CONTRAST:  76m OMNIPAQUE IOHEXOL 300 MG/ML  SOLN COMPARISON:  None. FINDINGS: Pharynx and larynx: Oral cavity within normal limits no visible acute abnormality about the dentition. Palatine tonsils symmetric and within normal limits. Few small calcified tonsilliths noted on the right. Nasopharynx normal. Layering retropharyngeal effusion seen throughout the retropharyngeal space without discrete retropharyngeal abscess. Epiglottis itself within normal limits. Vallecula largely effaced. Supraglottic airway is narrowed but remains patent. Glottis is closed and not well assessed. Subglottic airway clear. Salivary glands: Salivary glands including the parotid and submandibular glands within normal limits. Thyroid: Few scattered subcentimeter thyroid nodules noted, largest of which measures 4 mm. Findings felt to be of doubtful significance given size and patient age, no follow-up imaging recommended (ref: J Am Coll Radiol. 2015 Feb;12(2): 143-50). Lymph nodes: No pathologically enlarged lymph nodes seen within the neck. Vascular:  Normal intravascular enhancement seen throughout the neck. Limited intracranial: Unremarkable. Visualized orbits: Globes and orbital soft tissues demonstrate no acute finding. Mastoids and visualized paranasal sinuses: Visualized paranasal sinuses are clear. Mastoid air cells and middle ear cavities are well pneumatized and free of fluid. Skeleton: No acute osseous finding. No discrete or worrisome osseous lesions. Bones are somewhat diffusely sclerotic in appearance, suggesting underlying renal osteodystrophy. Upper chest: 5 mm left upper lobe nodule partially visualized (series 3, image 98). Partially visualized lungs are otherwise clear. Visualized upper chest demonstrates no other acute finding. Other: Diffuse soft tissue stranding seen throughout the subcutaneous fat of the submental region, with inferior extension along the anterior neck towards the upper anterior chest wall. Hazy stranding extends laterally to involve the masticator, parapharyngeal, and carotid spaces as well. No extension into the floor of mouth. Findings are nonspecific, but could reflect an acute inflammatory response/angio edema. Possible infection/cellulitis would be the primary differential consideration. No loculated or drainable fluid collections IMPRESSION: 1. Diffuse soft tissue stranding throughout the subcutaneous fat of the neck to involve the submental region as well as the bilateral masticator, parapharyngeal, and carotid spaces, with inferior extension along the anterior neck towards the upper  anterior chest wall. Findings are nonspecific, but could reflect an acute inflammatory response/angio edema. Possible infection/cellulitis would be the primary differential consideration. No loculated or drainable fluid collections. 2. Associated layering retropharyngeal effusion, likely reactive. Secondary narrowing of the supraglottic airway. 3. 5 mm left upper lobe pulmonary nodule, partially visualized, indeterminate. No follow-up  needed if patient is low-risk. Non-contrast chest CT can be considered in 12 months if patient is high-risk. This recommendation follows the consensus statement: Guidelines for Management of Incidental Pulmonary Nodules Detected on CT Images: From the Fleischner Society 2017; Radiology 2017; 284:228-243. Electronically Signed   By: Jeannine Boga M.D.   On: 01/08/2021 23:03   CT CHEST W CONTRAST  Result Date: 01/08/2021 CLINICAL DATA:  Facial swelling. EXAM: CT CHEST WITH CONTRAST TECHNIQUE: Multidetector CT imaging of the chest was performed during intravenous contrast administration. Right arm contrast injection. CONTRAST:  34m OMNIPAQUE IOHEXOL 300 MG/ML  SOLN COMPARISON:  Right upper extremity duplex earlier today. FINDINGS: Cardiovascular: No upper extremity central venous catheters on the current exam. Dense IV contrast within the right subclavian vein. Left subclavian vein is patent. Slight narrowing at the brachiocephalic SVC confluence, partially obscured by dense IV contrast in the right brachiocephalic vein, series 6 image 96. No evidence of SVC thrombus. No SVC narrowing. Heart is normal in size. No pericardial fluid. Cannot assess for pulmonary embolus given phase of contrast. The thoracic aorta is normal in caliber. The left vertebral artery arises directly from the thoracic aorta. Mild aortic atherosclerosis. Mediastinum/Nodes: No enlarged mediastinal or hilar lymph nodes. No suspicious thyroid nodule. No esophageal wall thickening. Lungs/Pleura: Elevation of the right hemidiaphragm with adjacent compressive atelectasis or scarring in the right middle and lower lobes. Bandlike opacity in the dependent left lower lobe, may be atelectasis or scarring, but technically indeterminate. No pleural fluid. The trachea and central bronchi are patent. Upper Abdomen: Renal atrophy consistent with known chronic renal disease. No acute upper abdominal findings. Musculoskeletal: Mild generalized  subcutaneous edema. Ground-glass densities throughout the osseous structures consistent with renal osteodystrophy. No acute osseous abnormalities are seen. Advanced left shoulder degenerative change. IMPRESSION: 1. Slight narrowing at the brachiocephalic SVC confluence, partially obscured by dense IV contrast in the right brachiocephalic vein. No distal SVC narrowing or evidence of SVC thrombus. 2. Elevation of the right hemidiaphragm with adjacent compressive atelectasis or scarring in the right middle and lower lobes. 3. Bandlike opacity in the dependent left lower lobe, may be atelectasis or scarring, but technically indeterminate. Aortic Atherosclerosis (ICD10-I70.0). Electronically Signed   By: MKeith RakeM.D.   On: 01/08/2021 21:43   PERIPHERAL VASCULAR CATHETERIZATION  Result Date: 01/10/2021  PATIENT: CELLEANNA MELLING                                                             MRN: [id number]: 630-Jul-1947                                             DATE OF PROCEDURE: 01/10/2021  INDICATIONS:   CGREGG HOLSTERis a 75y.o. female with a left upper arm AV graft.  She presented with some facial and neck swelling and  presents for a fistulogram.  PROCEDURE:   1.  Conscious sedation 2.  Ultrasound-guided access to left AV graft 3.  Fistulogram left upper arm loop AV graft 4.  Drug-coated balloon angioplasty of anastomotic stenosis  SURGEON: Judeth Cornfield. Scot Dock, MD, FACS  ANESTHESIA: Local with sedation  EBL: Minimal  TECHNIQUE: The patient was brought to the peripheral vascular lab and was sedated. The period of conscious sedation was 56 minutes.  During that time period, I was present face-to-face 100% of the time.  The patient was administered 1 mg of Versed and 25 mcg of fentanyl. The patient's heart rate, blood pressure, and oxygen saturation were monitored by the nurse continuously during the procedure.  The left arm was prepped and draped in usual sterile fashion.  It was somewhat  difficult to palpate the venous limb of the graft so I used ultrasound to identify the venous limb of the graft.  After the skin was anesthetized under ultrasound guidance I cannulated the venous limb of the graft with a micropuncture needle and a micropuncture sheath was introduced over a wire.  There was no clot in the graft at this level.  A real-time image was sent to the server.  I then placed a micropuncture sheath.  A fistulogram was done to a valuate the graft from the point of cannulation to include the central veins.  There was no central venous stenosis.  There was a 70% narrowing at the venous anastomosis and I elected to address this with venoplasty.  I upsized to a 6 [place] sheath and the patient received 2000 units of IV heparin.  I initially selected a 6 mm x 40 mm balloon which was inflated to 20 atm for 1 minute.  There was some residual stenosis.  I went back with an 8 mm x 40 mm balloon and inflated this to 18 atm for 1 minute.  This showed less than 15% residual stenosis.  I would then went back with a 8 mm x 40 mm drug-coated balloon and this was inflated extending slightly past the previous area of angioplasty and was inflated to 6 atm for 2-1/2 minutes.  Completion film showed an excellent result with less than 15% residual stenosis.  A 4-0 Monocryl was placed around the cannulation site.  The patient tolerated the procedure well was transferred to the recovery room in stable condition.  All needle and sponge counts were correct.  FINDINGS:  1.  There is no central venous stenosis to explain her facial swelling. 2.  The left upper arm loop AV graft is widely patent. 3.  There is no stenosis at the arterial anastomosis. 4.  There was a 70% stenosis at the venous anastomosis that was successfully addressed with drug-coated angioplasty as described above.  [name], MD, FACS [facility]  DATE OF DICTATION:   01/10/2021    US Venous Img Upper Uni  Right(DVT)  Result Date: 01/08/2021 CLINICAL DATA:  Swelling EXAM: Right UPPER EXTREMITY VENOUS DOPPLER ULTRASOUND TECHNIQUE: Gray-scale sonography with graded compression, as well as color Doppler and duplex ultrasound were performed to evaluate the upper extremity deep venous system from the level of the subclavian vein and including the jugular, axillary, basilic, radial, ulnar and upper cephalic vein. Spectral Doppler was utilized to evaluate flow at rest and with distal augmentation maneuvers. COMPARISON:  None. FINDINGS: Contralateral Subclavian Vein:  Not image Internal Jugular Vein: Narrowed appearing with eccentric thrombus/wall thickening. Color flow present. Subclavian Vein: No evidence of  thrombus. Normal compressibility, respiratory phasicity and response to augmentation. Axillary Vein: No evidence of thrombus. Normal compressibility, respiratory phasicity and response to augmentation. Cephalic Vein: No evidence of thrombus. Normal compressibility, respiratory phasicity and response to augmentation. Basilic Vein: No evidence of thrombus. Normal compressibility, respiratory phasicity and response to augmentation. Brachial Veins: No evidence of thrombus. Normal compressibility, respiratory phasicity and response to augmentation. Radial Veins: No evidence of thrombus. Normal compressibility, respiratory phasicity and response to augmentation. Ulnar Veins: No evidence of thrombus. Normal compressibility, respiratory phasicity and response to augmentation. Venous Reflux:  None visualized. Other Findings:  None visualized. IMPRESSION: 1. 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 2. Remainder of the right upper extremity deep venous system is patent and without evidence for DVT. Electronically Signed   By: Donavan Foil M.D.   On: 01/08/2021 19:55        Scheduled Meds: . calcium acetate  667 mg Oral TID WC   . Chlorhexidine Gluconate Cloth  6 each Topical Q0600  . cinacalcet  30 mg Oral Q M,W,F-HD  . heparin  5,000 Units Subcutaneous Q8H   Continuous Infusions:   LOS: 0 days    Time spent: 30 minutes    Barb Merino, MD [facility] Pager [phone]

## 2021-01-10 NOTE — Op Note (Signed)
° °  PATIENT: Amy [name]      MRN: [id number] DOB: 1946/09/19    DATE OF PROCEDURE: 01/10/2021  INDICATIONS:    Amy [name] is a 75 y.o. female with a left upper arm AV graft.  She presented with some facial and neck swelling and presents for a fistulogram.  PROCEDURE:    1.  Conscious sedation 2.  Ultrasound-guided access to left AV graft 3.  Fistulogram left upper arm loop AV graft 4.  Drug-coated balloon angioplasty of anastomotic stenosis  SURGEON: Judeth Cornfield. Scot Dock, MD, FACS  ANESTHESIA: Local with sedation  EBL: Minimal  TECHNIQUE: The patient was brought to the peripheral vascular lab and was sedated. The period of conscious sedation was 56 minutes.  During that time period, I was present face-to-face 100% of the time.  The patient was administered 1 mg of Versed and 25 mcg of fentanyl. The patient's heart rate, blood pressure, and oxygen saturation were monitored by the nurse continuously during the procedure.  The left arm was prepped and draped in usual sterile fashion.  It was somewhat difficult to palpate the venous limb of the graft so I used ultrasound to identify the venous limb of the graft.  After the skin was anesthetized under ultrasound guidance I cannulated the venous limb of the graft with a micropuncture needle and a micropuncture sheath was introduced over a wire.  There was no clot in the graft at this level.  A real-time image was sent to the server.  I then placed a micropuncture sheath.  A fistulogram was done to a valuate the graft from the point of cannulation to include the central veins.  There was no central venous stenosis.  There was a 70% narrowing at the venous anastomosis and I elected to address this with venoplasty.  I upsized to a 6 [place] sheath and the patient received 2000 units of IV heparin.  I initially selected a 6 mm x 40 mm balloon which was inflated to 20 atm for 1 minute.  There was some residual stenosis.  I went back with an 8  mm x 40 mm balloon and inflated this to 18 atm for 1 minute.  This showed less than 15% residual stenosis.  I would then went back with a 8 mm x 40 mm drug-coated balloon and this was inflated extending slightly past the previous area of angioplasty and was inflated to 6 atm for 2-1/2 minutes.  Completion film showed an excellent result with less than 15% residual stenosis.  A 4-0 Monocryl was placed around the cannulation site.  The patient tolerated the procedure well was transferred to the recovery room in stable condition.  All needle and sponge counts were correct.  FINDINGS:   1.  There is no central venous stenosis to explain her facial swelling. 2.  The left upper arm loop AV graft is widely patent. 3.  There is no stenosis at the arterial anastomosis. 4.  There was a 70% stenosis at the venous anastomosis that was successfully addressed with drug-coated angioplasty as described above.  [name], MD, FACS [facility]  DATE OF DICTATION:   01/10/2021

## 2021-01-10 NOTE — Progress Notes (Signed)
[facility] Progress Note   Subjective:     Amy [name] was seen and examined today at bedside. Patient underwent fistulogram today via [facility] Dr. Scot Dock. There was 70% stenosis at the venous anastomosis and a drug-coated angioplasty was performed. Patient currently resting in bed. Mild facial/neck swelling noted. Also noted mild L arm swelling. Denies SOB and CP. She is scheduled for HD treatment today. L loop AV graft is widely patent and okay to use.  Objective Vitals:   01/09/21 2014 01/10/21 0536 01/10/21 0926 01/10/21 0958  BP: (!) 141/61 (!) 122/58 119/72   Pulse: 95 91 94   Resp: 20 20 18    Temp: 97.7 F (36.5 C) 97.8 F (36.6 C) 98.2 F (36.8 C)   TempSrc:  Oral    SpO2: 99% 95% 95% 100%  Weight:      Height:       Physical Exam General: Appears comfortable; No acute respiratory distress Heart: Normal S1 and S2; No murmurs, gallops, or friction rub Lungs: Clear anteriorly/laterally; No wheezing, rales, or rhonchi Abdomen: Large, soft, non-tender, non-distended Extremities: No edema bilateral lower extremities Dialysis Access: L AVG (+) B/T  Filed Weights   01/08/21 1648 01/09/21 1257 01/09/21 2000  Weight: 71 kg 70.6 kg 70.7 kg    Intake/Output Summary (Last 24 hours) at 01/10/2021 1254 Last data filed at 01/10/2021 0831 Gross per 24 hour  Intake 480 ml  Output 0 ml  Net 480 ml    Additional Objective Labs: Basic Metabolic Panel: Recent Labs  Lab 01/08/21 1807 01/10/21 0247  NA 138 135  K 3.2* 3.4*  CL 93* 93*  CO2 32 30  GLUCOSE 116* 161*  BUN 9 20  CREATININE 3.68* 6.37*  CALCIUM 8.6* 9.2   CBC: Recent Labs  Lab 01/08/21 1807 01/10/21 0247  WBC 5.7 6.0  HGB 11.0* 10.9*  HCT 34.4* 34.2*  MCV 94.8 95.0  PLT 141* 141*   CBG: Recent Labs  Lab 01/09/21 2017 01/10/21 0636 01/10/21 1154  GLUCAP 218* 160* 156*   Studies/Results: CT Soft Tissue Neck W Contrast  Result Date: 01/08/2021 CLINICAL DATA:  Initial evaluation for  soft tissue mass, swelling. EXAM: CT NECK WITH CONTRAST TECHNIQUE: Multidetector CT imaging of the neck was performed using the standard protocol following the bolus administration of intravenous contrast. CONTRAST:  26m OMNIPAQUE IOHEXOL 300 MG/ML  SOLN COMPARISON:  None. FINDINGS: Pharynx and larynx: Oral cavity within normal limits no visible acute abnormality about the dentition. Palatine tonsils symmetric and within normal limits. Few small calcified tonsilliths noted on the right. Nasopharynx normal. Layering retropharyngeal effusion seen throughout the retropharyngeal space without discrete retropharyngeal abscess. Epiglottis itself within normal limits. Vallecula largely effaced. Supraglottic airway is narrowed but remains patent. Glottis is closed and not well assessed. Subglottic airway clear. Salivary glands: Salivary glands including the parotid and submandibular glands within normal limits. Thyroid: Few scattered subcentimeter thyroid nodules noted, largest of which measures 4 mm. Findings felt to be of doubtful significance given size and patient age, no follow-up imaging recommended (ref: J Am Coll Radiol. 2015 Feb;12(2): 143-50). Lymph nodes: No pathologically enlarged lymph nodes seen within the neck. Vascular: Normal intravascular enhancement seen throughout the neck. Limited intracranial: Unremarkable. Visualized orbits: Globes and orbital soft tissues demonstrate no acute finding. Mastoids and visualized paranasal sinuses: Visualized paranasal sinuses are clear. Mastoid air cells and middle ear cavities are well pneumatized and free of fluid. Skeleton: No acute osseous finding. No discrete or worrisome osseous lesions. Bones are  somewhat diffusely sclerotic in appearance, suggesting underlying renal osteodystrophy. Upper chest: 5 mm left upper lobe nodule partially visualized (series 3, image 98). Partially visualized lungs are otherwise clear. Visualized upper chest demonstrates no other acute  finding. Other: Diffuse soft tissue stranding seen throughout the subcutaneous fat of the submental region, with inferior extension along the anterior neck towards the upper anterior chest wall. Hazy stranding extends laterally to involve the masticator, parapharyngeal, and carotid spaces as well. No extension into the floor of mouth. Findings are nonspecific, but could reflect an acute inflammatory response/angio edema. Possible infection/cellulitis would be the primary differential consideration. No loculated or drainable fluid collections IMPRESSION: 1. Diffuse soft tissue stranding throughout the subcutaneous fat of the neck to involve the submental region as well as the bilateral masticator, parapharyngeal, and carotid spaces, with inferior extension along the anterior neck towards the upper anterior chest wall. Findings are nonspecific, but could reflect an acute inflammatory response/angio edema. Possible infection/cellulitis would be the primary differential consideration. No loculated or drainable fluid collections. 2. Associated layering retropharyngeal effusion, likely reactive. Secondary narrowing of the supraglottic airway. 3. 5 mm left upper lobe pulmonary nodule, partially visualized, indeterminate. No follow-up needed if patient is low-risk. Non-contrast chest CT can be considered in 12 months if patient is high-risk. This recommendation follows the consensus statement: Guidelines for Management of Incidental Pulmonary Nodules Detected on CT Images: From the Fleischner Society 2017; Radiology 2017; 284:228-243. Electronically Signed   By: Jeannine Boga M.D.   On: 01/08/2021 23:03   CT CHEST W CONTRAST  Result Date: 01/08/2021 CLINICAL DATA:  Facial swelling. EXAM: CT CHEST WITH CONTRAST TECHNIQUE: Multidetector CT imaging of the chest was performed during intravenous contrast administration. Right arm contrast injection. CONTRAST:  85m OMNIPAQUE IOHEXOL 300 MG/ML  SOLN COMPARISON:  Right  upper extremity duplex earlier today. FINDINGS: Cardiovascular: No upper extremity central venous catheters on the current exam. Dense IV contrast within the right subclavian vein. Left subclavian vein is patent. Slight narrowing at the brachiocephalic SVC confluence, partially obscured by dense IV contrast in the right brachiocephalic vein, series 6 image 96. No evidence of SVC thrombus. No SVC narrowing. Heart is normal in size. No pericardial fluid. Cannot assess for pulmonary embolus given phase of contrast. The thoracic aorta is normal in caliber. The left vertebral artery arises directly from the thoracic aorta. Mild aortic atherosclerosis. Mediastinum/Nodes: No enlarged mediastinal or hilar lymph nodes. No suspicious thyroid nodule. No esophageal wall thickening. Lungs/Pleura: Elevation of the right hemidiaphragm with adjacent compressive atelectasis or scarring in the right middle and lower lobes. Bandlike opacity in the dependent left lower lobe, may be atelectasis or scarring, but technically indeterminate. No pleural fluid. The trachea and central bronchi are patent. Upper Abdomen: Renal atrophy consistent with known chronic renal disease. No acute upper abdominal findings. Musculoskeletal: Mild generalized subcutaneous edema. Ground-glass densities throughout the osseous structures consistent with renal osteodystrophy. No acute osseous abnormalities are seen. Advanced left shoulder degenerative change. IMPRESSION: 1. Slight narrowing at the brachiocephalic SVC confluence, partially obscured by dense IV contrast in the right brachiocephalic vein. No distal SVC narrowing or evidence of SVC thrombus. 2. Elevation of the right hemidiaphragm with adjacent compressive atelectasis or scarring in the right middle and lower lobes. 3. Bandlike opacity in the dependent left lower lobe, may be atelectasis or scarring, but technically indeterminate. Aortic Atherosclerosis (ICD10-I70.0). Electronically Signed   By:  MKeith RakeM.D.   On: 01/08/2021 21:43   PERIPHERAL VASCULAR CATHETERIZATION  Result Date:  01/10/2021  PATIENT: Amy [name]                                                              MRN: [id number] DOB: 11/29/1945                                              DATE OF PROCEDURE: 01/10/2021  INDICATIONS:   Amy [name] is a 75 y.o. female with a left upper arm AV graft.  She presented with some facial and neck swelling and presents for a fistulogram.  PROCEDURE:   1.  Conscious sedation 2.  Ultrasound-guided access to left AV graft 3.  Fistulogram left upper arm loop AV graft 4.  Drug-coated balloon angioplasty of anastomotic stenosis  SURGEON: Judeth Cornfield. Scot Dock, MD, FACS  ANESTHESIA: Local with sedation  EBL: Minimal  TECHNIQUE: The patient was brought to the peripheral vascular lab and was sedated. The period of conscious sedation was 56 minutes.  During that time period, I was present face-to-face 100% of the time.  The patient was administered 1 mg of Versed and 25 mcg of fentanyl. The patient's heart rate, blood pressure, and oxygen saturation were monitored by the nurse continuously during the procedure.  The left arm was prepped and draped in usual sterile fashion.  It was somewhat difficult to palpate the venous limb of the graft so I used ultrasound to identify the venous limb of the graft.  After the skin was anesthetized under ultrasound guidance I cannulated the venous limb of the graft with a micropuncture needle and a micropuncture sheath was introduced over a wire.  There was no clot in the graft at this level.  A real-time image was sent to the server.  I then placed a micropuncture sheath.  A fistulogram was done to a valuate the graft from the point of cannulation to include the central veins.  There was no central venous stenosis.  There was a 70% narrowing at the venous anastomosis and I elected to address this with venoplasty.  I upsized to a 6 [place] sheath and  the patient received 2000 units of IV heparin.  I initially selected a 6 mm x 40 mm balloon which was inflated to 20 atm for 1 minute.  There was some residual stenosis.  I went back with an 8 mm x 40 mm balloon and inflated this to 18 atm for 1 minute.  This showed less than 15% residual stenosis.  I would then went back with a 8 mm x 40 mm drug-coated balloon and this was inflated extending slightly past the previous area of angioplasty and was inflated to 6 atm for 2-1/2 minutes.  Completion film showed an excellent result with less than 15% residual stenosis.  A 4-0 Monocryl was placed around the cannulation site.  The patient tolerated the procedure well was transferred to the recovery room in stable condition.  All needle and sponge counts were correct.  FINDINGS:  1.  There is no central venous stenosis to explain her facial swelling. 2.  The left upper arm loop AV graft is widely patent. 3.  There is no stenosis at the arterial  anastomosis. 4.  There was a 70% stenosis at the venous anastomosis that was successfully addressed with drug-coated angioplasty as described above.  [name], MD, FACS [facility]  DATE OF DICTATION:   01/10/2021    US Venous Img Upper Uni Right(DVT)  Result Date: 01/08/2021 CLINICAL DATA:  Swelling EXAM: Right UPPER EXTREMITY VENOUS DOPPLER ULTRASOUND TECHNIQUE: Gray-scale sonography with graded compression, as well as color Doppler and duplex ultrasound were performed to evaluate the upper extremity deep venous system from the level of the subclavian vein and including the jugular, axillary, basilic, radial, ulnar and upper cephalic vein. Spectral Doppler was utilized to evaluate flow at rest and with distal augmentation maneuvers. COMPARISON:  None. FINDINGS: Contralateral Subclavian Vein:  Not image Internal Jugular Vein: Narrowed appearing with eccentric thrombus/wall thickening. Color flow present. Subclavian Vein: No evidence  of thrombus. Normal compressibility, respiratory phasicity and response to augmentation. Axillary Vein: No evidence of thrombus. Normal compressibility, respiratory phasicity and response to augmentation. Cephalic Vein: No evidence of thrombus. Normal compressibility, respiratory phasicity and response to augmentation. Basilic Vein: No evidence of thrombus. Normal compressibility, respiratory phasicity and response to augmentation. Brachial Veins: No evidence of thrombus. Normal compressibility, respiratory phasicity and response to augmentation. Radial Veins: No evidence of thrombus. Normal compressibility, respiratory phasicity and response to augmentation. Ulnar Veins: No evidence of thrombus. Normal compressibility, respiratory phasicity and response to augmentation. Venous Reflux:  None visualized. Other Findings:  None visualized. IMPRESSION: 1. 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 2. Remainder of the right upper extremity deep venous system is patent and without evidence for DVT. Electronically Signed   By: Donavan Foil M.D.   On: 01/08/2021 19:55    Medications:  . calcium acetate  667 mg Oral TID WC  . Chlorhexidine Gluconate Cloth  6 each Topical Q0600  . cinacalcet  30 mg Oral Q M,W,F-HD  . heparin  5,000 Units Subcutaneous Q8H    Dialysis Orders: [facility] MWF 3 hr 45 min 180NRe 400/500 70.5 kg 2.0K/2.0 Ca AVG (NOT a HERO Graft!) -Heparin 7000 units IV TIW -Mircera 30 mcg IV q 4 weeks (last dose Mircera 50 mcg IV 12/30/20) -Cinacalcet 30 mg PO TIW  Assessment/Plan: 1. Swelling: LUE, Face, Neck: Patient seen by [facility]. Fistulogram (Dr. Scot Dock) completed today which showed: 70% stenosis at the venous anastomosis, addressed with drug-coated angioplasty. No stenosis at the arterial anastomosis and no central venous stenosis. L AVG is widely patent and okay to use at this time. 2. ESRD -MWF:  Scheduled for HD today. UF as tolerated. K+ at 3.4. Continue with 2K bath, will monitor K+ trends.  3. Anemia of CKD- Hgb 10.9. Will follow Hgb trends. 4. Secondary hyperparathyroidism - Last OP labs at goal. Continue Calcium acetate and Sensipar. 5. HTN/volume - BP controlled, no evidence of fluid volume overload at this time. 6. Nutrition - Advance to renal diet as tolerated.  Tobie Poet, NP [facility] 01/10/2021,12:54 PM  LOS: 0 days

## 2021-01-10 NOTE — Progress Notes (Signed)
Initial Nutrition Assessment  DOCUMENTATION CODES:  Not applicable  INTERVENTION:  Liberalize diet to regular to promote more choice - spoke with MD.  Add Rena-Vite daily.  Add Magic cup TID with meals, each supplement provides 290 kcal and 9 grams of protein. D/C'd Nepro.  NUTRITION DIAGNOSIS:  Increased nutrient needs related to chronic illness (ESRD) as evidenced by estimated needs.  GOAL:  Patient will meet greater than or equal to 90% of their needs  MONITOR:  PO intake,Supplement acceptance,Labs,Diet advancement,Weight trends  REASON FOR ASSESSMENT:  Consult Other (Comment) (Nutritional goals)  ASSESSMENT:  75 yo female with a PMH of HTN, HLD, T2DM, ESRD on HD (MWF) and hx of LUE graft placement on 09/24/20; she started using the graft in January and had TDC pulled who presents with angioedema.  NPO most of today for venous anastomosis (found to have 70% blockage and venoplasty conducted).   At home, she reports having an okay appetite up until about 3 weeks ago, when her appetite vanished - she is unsure of the cause. Otherwise, her days at dialysis and not look similar. Breakfast: toast with butter and tea. Lunch: fast food usually ([employer]Donald's) or sometimes home cooked meal. Dinner: meat (porkchops), collard greens, and pinto beans. She reports eating a lot less when she did PD because the PD made her feel terrible, but at dialysis, she gets home and cannot wait to eat.   In the hospital, she is eating well, 100% of her meals. Per Epic, ate 100% of her dinner last night and RD noticed 100% of lunch had been eaten. She also reported having difficulty swallowing salads and some lettuce, but she thinks this will be better now that the swelling has gone down in her face.  Of note, pt does make urine. Urine occurences are unmeasured in Epic. x2 in 24 hrs.  70.5 kg dry wt per MD note. She endorses this weight as her dry weight. She reports no weight loss recently other than  fluid. Some depletion in her lower extremities, pt reports getting around unassisted and well. Noted that fluid on upper body may be masking depletion also. Pt at risk for developing malnutrition.  Reported that she does not like Ensure/Nepro d/t N/V episode previously, but is interested in trying orange creme Magic Cups. Encouraged intake of these and PO protein.  Relevant Medications: PhosLo, cinacalcet Labs: reviewed; K 3.4, Glucose 160 No HbA1c on file.  NUTRITION - FOCUSED PHYSICAL EXAM: Flowsheet Row Most Recent Value  Orbital Region No depletion  Upper Arm Region Mild depletion  Thoracic and Lumbar Region No depletion  Buccal Region No depletion  Temple Region No depletion  Clavicle Bone Region No depletion  Clavicle and Acromion Bone Region Mild depletion  Scapular Bone Region No depletion  Dorsal Hand No depletion  Patellar Region Mild depletion  Anterior Thigh Region Moderate depletion  Posterior Calf Region Mild depletion  Edema (RD Assessment) Mild  Hair Reviewed  Eyes Reviewed  [pale conjunctiva]  Mouth Reviewed  Skin Reviewed  Nails Reviewed  [pale nail beds]     Diet Order:   Diet Order            Diet regular Room service appropriate? Yes; Fluid consistency: Thin  Diet effective now                EDUCATION NEEDS:  Education needs have been addressed  Skin:  Skin Assessment: Reviewed RN Assessment  Last BM:  01/09/21  Height:  Ht Readings from Last  1 Encounters:  01/09/21 '5\' 6"'$  (1.676 m)   Weight:  Wt Readings from Last 1 Encounters:  01/09/21 70.7 kg   Ideal Body Weight:  61.4 kg  BMI:  Body mass index is 25.16 kg/m.  Estimated Nutritional Needs:  Kcal:  1850-2050 Protein:  85-110 grams Fluid:  UOP + 1000 ml  Derrel Nip, RD, LDN Registered Dietitian After Hours/Weekend Pager # in [place]

## 2021-01-11 LAB — GLUCOSE, CAPILLARY
Glucose-Capillary: 176 mg/dL — ABNORMAL HIGH (ref 70–99)
Glucose-Capillary: 239 mg/dL — ABNORMAL HIGH (ref 70–99)

## 2021-01-11 NOTE — Discharge Summary (Signed)
Physician Discharge Summary  Amy [name] UXL:[id number] DOB: May 13, 1946 DOA: 01/08/2021  PCP: [name], MD  Admit date: 01/08/2021 Discharge date: 01/11/2021  Admitted From: Home Disposition: Home  Recommendations for Outpatient Follow-up:  1. Follow-up with dialysis on Monday.  Home Health: Not applicable Equipment/Devices: Not needed  Discharge Condition: Stable CODE STATUS: Full code Diet recommendation: Low-salt, low-carb diet  Discharge summary:  75 year old female who is on hemodialysis for ESRD and recently started using the left upper extremity AV fistula about a month, hypertension, hyperlipidemia, type 2 diabetes presented to the hospital with recurrent head and neck edema.  Patient started developing some swelling of her face and neck since 2/15.  Occasional dysphagia but no airway obstruction. Left upper extremity AV fistula working well.  Symptoms started after starting on hemodialysis through the fistula. Never been on any ACE or ARB. Adequate fluid removed.  No evidence of localizing infection. She was admitted to the hospital to undergo vascular surgery evaluation.  CT scan was consistent with diffuse swelling and edema of the left upper arm, submandibular space but no evidence of airway obstruction.  No clinical evidence of angioedema or airway obstruction.  Symptoms started after using left AV graft, she underwent fistulogram that was negative for any central venous stenosis, AV graft was widely patent.  There was a stenosis on the venous anastomosis that was inflated with drug-coated angioplasty.  She is already feeling better, neck swelling is improving.  Hopefully this will improve further with more fluid removal.  Clinically stable.  Advised to monitor for any more swelling or difficulty with breathing or any difficulty with swallowing.  Also elevate left arm.    Discharge Diagnoses:  Principal Problem:   Angio-edema Active Problems:   Essential  hypertension   Type 2 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, with long-term current use of insulin (HCC)   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HCC)   Dyslipidemia   Neck swelling    Discharge Instructions  Discharge Instructions    Call MD for:  difficulty breathing, headache or visual disturbances   Complete by: As directed    Diet - low sodium heart healthy   Complete by: As directed    Diet Carb Modified   Complete by: As directed    Increase activity slowly   Complete by: As directed      Allergies as of 01/11/2021      Reactions   Sensipar [cinacalcet] Other (See Comments)   Other reaction(s): Unknown      Medication List    TAKE these medications   blood glucose meter kit and supplies Kit Dispense based on patient and insurance preference. Use up to four times daily as directed. (FOR ICD-9 250.00, 250.01).   calcium acetate 667 MG capsule Commonly known as: PHOSLO Take 667 mg by mouth 3 (three) times daily with meals.   cinacalcet 30 MG tablet Commonly known as: SENSIPAR Take 30 mg by mouth daily.   ethyl chloride spray Apply 1 application topically 3 (three) times a week. Dialysis days   FreeStyle Libre 14 Day Sensor Misc 1 Units by Does not apply route every 14 (fourteen) days.   MIRCERA IJ Mircera   Rena-Vite Rx 1 MG Tabs Take 1 tablet by mouth daily.       Allergies  Allergen Reactions  . Sensipar [Cinacalcet] Other (See Comments)    Other reaction(s): Unknown    Consultations:  Nephrology  Vascular surgery   Procedures/Studies: CT Soft Tissue Neck  W Contrast  Result Date: 01/08/2021 CLINICAL DATA:  Initial evaluation for soft tissue mass, swelling. EXAM: CT NECK WITH CONTRAST TECHNIQUE: Multidetector CT imaging of the neck was performed using the standard protocol following the bolus administration of intravenous contrast. CONTRAST:  25m OMNIPAQUE IOHEXOL 300 MG/ML  SOLN COMPARISON:  None. FINDINGS: Pharynx and  larynx: Oral cavity within normal limits no visible acute abnormality about the dentition. Palatine tonsils symmetric and within normal limits. Few small calcified tonsilliths noted on the right. Nasopharynx normal. Layering retropharyngeal effusion seen throughout the retropharyngeal space without discrete retropharyngeal abscess. Epiglottis itself within normal limits. Vallecula largely effaced. Supraglottic airway is narrowed but remains patent. Glottis is closed and not well assessed. Subglottic airway clear. Salivary glands: Salivary glands including the parotid and submandibular glands within normal limits. Thyroid: Few scattered subcentimeter thyroid nodules noted, largest of which measures 4 mm. Findings felt to be of doubtful significance given size and patient age, no follow-up imaging recommended (ref: J Am Coll Radiol. 2015 Feb;12(2): 143-50). Lymph nodes: No pathologically enlarged lymph nodes seen within the neck. Vascular: Normal intravascular enhancement seen throughout the neck. Limited intracranial: Unremarkable. Visualized orbits: Globes and orbital soft tissues demonstrate no acute finding. Mastoids and visualized paranasal sinuses: Visualized paranasal sinuses are clear. Mastoid air cells and middle ear cavities are well pneumatized and free of fluid. Skeleton: No acute osseous finding. No discrete or worrisome osseous lesions. Bones are somewhat diffusely sclerotic in appearance, suggesting underlying renal osteodystrophy. Upper chest: 5 mm left upper lobe nodule partially visualized (series 3, image 98). Partially visualized lungs are otherwise clear. Visualized upper chest demonstrates no other acute finding. Other: Diffuse soft tissue stranding seen throughout the subcutaneous fat of the submental region, with inferior extension along the anterior neck towards the upper anterior chest wall. Hazy stranding extends laterally to involve the masticator, parapharyngeal, and carotid spaces as  well. No extension into the floor of mouth. Findings are nonspecific, but could reflect an acute inflammatory response/angio edema. Possible infection/cellulitis would be the primary differential consideration. No loculated or drainable fluid collections IMPRESSION: 1. Diffuse soft tissue stranding throughout the subcutaneous fat of the neck to involve the submental region as well as the bilateral masticator, parapharyngeal, and carotid spaces, with inferior extension along the anterior neck towards the upper anterior chest wall. Findings are nonspecific, but could reflect an acute inflammatory response/angio edema. Possible infection/cellulitis would be the primary differential consideration. No loculated or drainable fluid collections. 2. Associated layering retropharyngeal effusion, likely reactive. Secondary narrowing of the supraglottic airway. 3. 5 mm left upper lobe pulmonary nodule, partially visualized, indeterminate. No follow-up needed if patient is low-risk. Non-contrast chest CT can be considered in 12 months if patient is high-risk. This recommendation follows the consensus statement: Guidelines for Management of Incidental Pulmonary Nodules Detected on CT Images: From the Fleischner Society 2017; Radiology 2017; 284:228-243. Electronically Signed   By: BJeannine BogaM.D.   On: 01/08/2021 23:03   CT CHEST W CONTRAST  Result Date: 01/08/2021 CLINICAL DATA:  Facial swelling. EXAM: CT CHEST WITH CONTRAST TECHNIQUE: Multidetector CT imaging of the chest was performed during intravenous contrast administration. Right arm contrast injection. CONTRAST:  750mOMNIPAQUE IOHEXOL 300 MG/ML  SOLN COMPARISON:  Right upper extremity duplex earlier today. FINDINGS: Cardiovascular: No upper extremity central venous catheters on the current exam. Dense IV contrast within the right subclavian vein. Left subclavian vein is patent. Slight narrowing at the brachiocephalic SVC confluence, partially obscured by  dense IV contrast in the right  brachiocephalic vein, series 6 image 96. No evidence of SVC thrombus. No SVC narrowing. Heart is normal in size. No pericardial fluid. Cannot assess for pulmonary embolus given phase of contrast. The thoracic aorta is normal in caliber. The left vertebral artery arises directly from the thoracic aorta. Mild aortic atherosclerosis. Mediastinum/Nodes: No enlarged mediastinal or hilar lymph nodes. No suspicious thyroid nodule. No esophageal wall thickening. Lungs/Pleura: Elevation of the right hemidiaphragm with adjacent compressive atelectasis or scarring in the right middle and lower lobes. Bandlike opacity in the dependent left lower lobe, may be atelectasis or scarring, but technically indeterminate. No pleural fluid. The trachea and central bronchi are patent. Upper Abdomen: Renal atrophy consistent with known chronic renal disease. No acute upper abdominal findings. Musculoskeletal: Mild generalized subcutaneous edema. Ground-glass densities throughout the osseous structures consistent with renal osteodystrophy. No acute osseous abnormalities are seen. Advanced left shoulder degenerative change. IMPRESSION: 1. Slight narrowing at the brachiocephalic SVC confluence, partially obscured by dense IV contrast in the right brachiocephalic vein. No distal SVC narrowing or evidence of SVC thrombus. 2. Elevation of the right hemidiaphragm with adjacent compressive atelectasis or scarring in the right middle and lower lobes. 3. Bandlike opacity in the dependent left lower lobe, may be atelectasis or scarring, but technically indeterminate. Aortic Atherosclerosis (ICD10-I70.0). Electronically Signed   By: Keith Rake M.D.   On: 01/08/2021 21:43   PERIPHERAL VASCULAR CATHETERIZATION  Result Date: 01/10/2021  PATIENT: BERDINE RASMUSSON                                                              MRN: [id number] DOB: [birth year]                                              DATE OF  PROCEDURE: 01/10/2021  INDICATIONS:   YAILEEN HOFFERBER is a 75 y.o. female with a left upper arm AV graft.  She presented with some facial and neck swelling and presents for a fistulogram.  PROCEDURE:   1.  Conscious sedation 2.  Ultrasound-guided access to left AV graft 3.  Fistulogram left upper arm loop AV graft 4.  Drug-coated balloon angioplasty of anastomotic stenosis  SURGEON: Judeth Cornfield. Scot Dock, MD, FACS  ANESTHESIA: Local with sedation  EBL: Minimal  TECHNIQUE: The patient was brought to the peripheral vascular lab and was sedated. The period of conscious sedation was 56 minutes.  During that time period, I was present face-to-face 100% of the time.  The patient was administered 1 mg of Versed and 25 mcg of fentanyl. The patient's heart rate, blood pressure, and oxygen saturation were monitored by the nurse continuously during the procedure.  The left arm was prepped and draped in usual sterile fashion.  It was somewhat difficult to palpate the venous limb of the graft so I used ultrasound to identify the venous limb of the graft.  After the skin was anesthetized under ultrasound guidance I cannulated the venous limb of the graft with a micropuncture needle and a micropuncture sheath was introduced over a wire.  There was no clot in the graft at this level.  A real-time image was sent to the server.  I then placed a micropuncture sheath.  A fistulogram was done to a valuate the graft from the point of cannulation to include the central veins.  There was no central venous stenosis.  There was a 70% narrowing at the venous anastomosis and I elected to address this with venoplasty.  I upsized to a 6 [place] sheath and the patient received 2000 units of IV heparin.  I initially selected a 6 mm x 40 mm balloon which was inflated to 20 atm for 1 minute.  There was some residual stenosis.  I went back with an 8 mm x 40 mm balloon and inflated this to 18 atm for 1 minute.  This showed less than 15%  residual stenosis.  I would then went back with a 8 mm x 40 mm drug-coated balloon and this was inflated extending slightly past the previous area of angioplasty and was inflated to 6 atm for 2-1/2 minutes.  Completion film showed an excellent result with less than 15% residual stenosis.  A 4-0 Monocryl was placed around the cannulation site.  The patient tolerated the procedure well was transferred to the recovery room in stable condition.  All needle and sponge counts were correct.  FINDINGS:  1.  There is no central venous stenosis to explain her facial swelling. 2.  The left upper arm loop AV graft is widely patent. 3.  There is no stenosis at the arterial anastomosis. 4.  There was a 70% stenosis at the venous anastomosis that was successfully addressed with drug-coated angioplasty as described above.  [name], MD, FACS [facility]  DATE OF DICTATION:   01/10/2021    US Venous Img Upper Uni Right(DVT)  Result Date: 01/08/2021 CLINICAL DATA:  Swelling EXAM: Right UPPER EXTREMITY VENOUS DOPPLER ULTRASOUND TECHNIQUE: Gray-scale sonography with graded compression, as well as color Doppler and duplex ultrasound were performed to evaluate the upper extremity deep venous system from the level of the subclavian vein and including the jugular, axillary, basilic, radial, ulnar and upper cephalic vein. Spectral Doppler was utilized to evaluate flow at rest and with distal augmentation maneuvers. COMPARISON:  None. FINDINGS: Contralateral Subclavian Vein:  Not image Internal Jugular Vein: Narrowed appearing with eccentric thrombus/wall thickening. Color flow present. Subclavian Vein: No evidence of thrombus. Normal compressibility, respiratory phasicity and response to augmentation. Axillary Vein: No evidence of thrombus. Normal compressibility, respiratory phasicity and response to augmentation. Cephalic Vein: No evidence of thrombus. Normal compressibility, respiratory  phasicity and response to augmentation. Basilic Vein: No evidence of thrombus. Normal compressibility, respiratory phasicity and response to augmentation. Brachial Veins: No evidence of thrombus. Normal compressibility, respiratory phasicity and response to augmentation. Radial Veins: No evidence of thrombus. Normal compressibility, respiratory phasicity and response to augmentation. Ulnar Veins: No evidence of thrombus. Normal compressibility, respiratory phasicity and response to augmentation. Venous Reflux:  None visualized. Other Findings:  None visualized. IMPRESSION: 1. 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 2. Remainder of the right upper extremity deep venous system is patent and without evidence for DVT. Electronically Signed   By: Donavan Foil M.D.   On: 01/08/2021 19:55    (Echo, Carotid, EGD, Colonoscopy, ERCP)    Subjective: Patient seen and examined.  She is eating breakfast.  Denies any trouble swallowing or breathing.  She is visually impaired and cannot see herself, however she thinks she does not have much issues on her face and neck.  No pain  on the left arm.  She thinks swelling is improving.   Discharge Exam: Vitals:   01/11/21 0551 01/11/21 1008  BP: (!) 104/43 (!) 89/43  Pulse: 93 99  Resp: 18 18  Temp: 97.8 F (36.6 C) 97.9 F (36.6 C)  SpO2: 93% 98%   Vitals:   01/11/21 0145 01/11/21 0207 01/11/21 0551 01/11/21 1008  BP: (!) (P) 109/54 115/61 (!) 104/43 (!) 89/43  Pulse: (P) 97 98 93 99  Resp: (P) _0 Temp: (P) 98.5 F (36.9 C) 97.9 F (36.6 C) 97.8 F (36.6 C) 97.9 F (36.6 C)  TempSrc: (P) Oral Oral    SpO2: (P) 98% 96% 93% 98%  Weight:      Height:        General: Pt is alert, awake, not in acute distress Eating breakfast. Cardiovascular: RRR, S1/S2 +, no rubs, no gallops Respiratory: CTA bilaterally, no wheezing, no rhonchi Abdominal: Soft, NT,  ND, bowel sounds + Extremities:  Left arm is slightly edematous.  AV graft present.  No palpable swelling or edema at the face.  Oropharynx is clear.    The results of significant diagnostics from this hospitalization (including imaging, microbiology, ancillary and laboratory) are listed below for reference.     Microbiology: Recent Results (from the past 240 hour(s))  Resp Panel by RT-PCR (Flu A&B, Covid) Nasopharyngeal Swab     Status: None   Collection Time: 01/08/21 11:57 PM   Specimen: Nasopharyngeal Swab; Nasopharyngeal(NP) swabs in vial transport medium  Result Value Ref Range Status   SARS Coronavirus 2 by RT PCR NEGATIVE NEGATIVE Final    Comment: (NOTE) SARS-CoV-2 target nucleic acids are NOT DETECTED.  The SARS-CoV-2 RNA is generally detectable in upper respiratory specimens during the acute phase of infection. The lowest concentration of SARS-CoV-2 viral copies this assay can detect is 138 copies/mL. A negative result does not preclude SARS-Cov-2 infection and should not be used as the sole basis for treatment or other patient management decisions. A negative result may occur with  improper specimen collection/handling, submission of specimen other than nasopharyngeal swab, presence of viral mutation(s) within the areas targeted by this assay, and inadequate number of viral copies(<138 copies/mL). A negative result must be combined with clinical observations, patient history, and epidemiological information. The expected result is Negative.  Fact Sheet for Patients:  EntrepreneurPulse.com.au  Fact Sheet for Healthcare Providers:  IncredibleEmployment.be  This test is no t yet approved or cleared by the [place] FDA and  has been authorized for detection and/or diagnosis of SARS-CoV-2 by FDA under an Emergency Use Authorization (EUA). This EUA will remain  in effect (meaning this test can be used) for the duration of  the COVID-19 declaration under Section 564(b)(1) of the Act, 21 U.S.C.section 360bbb-3(b)(1), unless the authorization is terminated  or revoked sooner.       Influenza A by PCR NEGATIVE NEGATIVE Final   Influenza B by PCR NEGATIVE NEGATIVE Final    Comment: (NOTE) The Xpert Xpress SARS-CoV-2/FLU/RSV plus assay is intended as an aid in the diagnosis of influenza from Nasopharyngeal swab specimens and should not be used as a sole basis for treatment. Nasal washings and aspirates are unacceptable for Xpert Xpress SARS-CoV-2/FLU/RSV testing.  Fact Sheet for Patients: EntrepreneurPulse.com.au  Fact Sheet for Healthcare Providers: IncredibleEmployment.be  This test is not yet approved or cleared by the [place] FDA and has been authorized for detection and/or diagnosis of SARS-CoV-2 by FDA under an Emergency Use Authorization (EUA).  This EUA will remain in effect (meaning this test can be used) for the duration of the COVID-19 declaration under Section 564(b)(1) of the Act, 21 U.S.C. section 360bbb-3(b)(1), unless the authorization is terminated or revoked.  Performed at [facility], [address] [place], [place] [address]      Labs: BNP (last 3 results) No results for input(s): BNP in the last 8760 hours. Basic Metabolic Panel: Recent Labs  Lab 01/08/21 1807 01/10/21 0247  NA 138 135  K 3.2* 3.4*  CL 93* 93*  CO2 32 30  GLUCOSE 116* 161*  BUN 9 20  CREATININE 3.68* 6.37*  CALCIUM 8.6* 9.2   Liver Function Tests: No results for input(s): AST, ALT, ALKPHOS, BILITOT, PROT, ALBUMIN in the last 168 hours. No results for input(s): LIPASE, AMYLASE in the last 168 hours. No results for input(s): AMMONIA in the last 168 hours. CBC: Recent Labs  Lab 01/08/21 1807 01/10/21 0247  WBC 5.7 6.0  HGB 11.0* 10.9*  HCT 34.4* 34.2*  MCV 94.8 95.0  PLT 141* 141*   Cardiac Enzymes: No results for input(s): CKTOTAL,  CKMB, CKMBINDEX, TROPONINI in the last 168 hours. BNP: Invalid input(s): POCBNP CBG: Recent Labs  Lab 01/10/21 0636 01/10/21 1154 01/10/21 1612 01/10/21 2048 01/11/21 0720  GLUCAP 160* 156* 195* 230* 176*   D-Dimer No results for input(s): DDIMER in the last 72 hours. Hgb A1c No results for input(s): HGBA1C in the last 72 hours. Lipid Profile No results for input(s): CHOL, HDL, LDLCALC, TRIG, CHOLHDL, LDLDIRECT in the last 72 hours. Thyroid function studies No results for input(s): TSH, T4TOTAL, T3FREE, THYROIDAB in the last 72 hours.  Invalid input(s): FREET3 Anemia work up No results for input(s): VITAMINB12, FOLATE, FERRITIN, TIBC, IRON, RETICCTPCT in the last 72 hours. Urinalysis No results found for: COLORURINE, APPEARANCEUR, [place], [place], GLUCOSEU, [place], [place], [place], PROTEINUR, UROBILINOGEN, NITRITE, LEUKOCYTESUR Sepsis Labs Invalid input(s): PROCALCITONIN,  WBC,  LACTICIDVEN Microbiology Recent Results (from the past 240 hour(s))  Resp Panel by RT-PCR (Flu A&B, Covid) Nasopharyngeal Swab     Status: None   Collection Time: 01/08/21 11:57 PM   Specimen: Nasopharyngeal Swab; Nasopharyngeal(NP) swabs in vial transport medium  Result Value Ref Range Status   SARS Coronavirus 2 by RT PCR NEGATIVE NEGATIVE Final    Comment: (NOTE) SARS-CoV-2 target nucleic acids are NOT DETECTED.  The SARS-CoV-2 RNA is generally detectable in upper respiratory specimens during the acute phase of infection. The lowest concentration of SARS-CoV-2 viral copies this assay can detect is 138 copies/mL. A negative result does not preclude SARS-Cov-2 infection and should not be used as the sole basis for treatment or other patient management decisions. A negative result may occur with  improper specimen collection/handling, submission of specimen other than nasopharyngeal swab, presence of viral mutation(s) within the areas targeted by this assay, and inadequate number of  viral copies(<138 copies/mL). A negative result must be combined with clinical observations, patient history, and epidemiological information. The expected result is Negative.  Fact Sheet for Patients:  EntrepreneurPulse.com.au  Fact Sheet for Healthcare Providers:  IncredibleEmployment.be  This test is no t yet approved or cleared by the [place] FDA and  has been authorized for detection and/or diagnosis of SARS-CoV-2 by FDA under an Emergency Use Authorization (EUA). This EUA will remain  in effect (meaning this test can be used) for the duration of the COVID-19 declaration under Section 564(b)(1) of the Act, 21 U.S.C.section 360bbb-3(b)(1), unless the authorization is terminated  or  revoked sooner.       Influenza A by PCR NEGATIVE NEGATIVE Final   Influenza B by PCR NEGATIVE NEGATIVE Final    Comment: (NOTE) The Xpert Xpress SARS-CoV-2/FLU/RSV plus assay is intended as an aid in the diagnosis of influenza from Nasopharyngeal swab specimens and should not be used as a sole basis for treatment. Nasal washings and aspirates are unacceptable for Xpert Xpress SARS-CoV-2/FLU/RSV testing.  Fact Sheet for Patients: EntrepreneurPulse.com.au  Fact Sheet for Healthcare Providers: IncredibleEmployment.be  This test is not yet approved or cleared by the [place] FDA and has been authorized for detection and/or diagnosis of SARS-CoV-2 by FDA under an Emergency Use Authorization (EUA). This EUA will remain in effect (meaning this test can be used) for the duration of the COVID-19 declaration under Section 564(b)(1) of the Act, 21 U.S.C. section 360bbb-3(b)(1), unless the authorization is terminated or revoked.  Performed at [facility], [address] [place], [place] [address]      Time coordinating discharge: 28 minutes  SIGNED:   Barb Merino, MD  [facility] 01/11/2021, 11:18 AM

## 2021-01-11 NOTE — Progress Notes (Signed)
DISCHARGE NOTE HOME Shyah Sollers Hagans to be discharged tohome per MD order. Discussed prescriptions and follow up appointments with the patient. Prescriptions given to patient; medication list explained in detail. Patient verbalized understanding.  Skin clean, dry and intact without evidence of skin break down, no evidence of skin tears noted. IV catheter discontinued intact. Site without signs and symptoms of complications. Dressing and pressure applied. Pt denies pain at the site currently. No complaints noted.  Patient free of lines, drains, and wounds.   An After Visit Summary (AVS) was printed and given to the patient. Patient escorted via wheelchair, and discharged home via private auto.  [place], Zenon Mayo, RN

## 2021-01-11 NOTE — Progress Notes (Addendum)
[facility] Progress Note   Subjective:     Amy [name] was seen and examined today at bedside. S/P fistulogram 01/10/21 via [facility] Dr. Scot Dock. There was 70% stenosis at the venous anastomosis and a drug-coated angioplasty was performed. Patient currently resting in bed. She is asking when she can go home. Facial neck swelling appears to be improving. L Arm swelling (L AVG) improving as well. Denies difficulty swallowing, SOB and CP. She tolerated HD 01/10/21 with UF 1.2L. Spoke with Dr. Sloan Leiter, plan for possible discharge home today.  Objective Vitals:   01/11/21 0130 01/11/21 0145 01/11/21 0207 01/11/21 0551  BP: (!) 103/58 (!) (P) 109/54 115/61 (!) 104/43  Pulse: 94 (P) 97 98 93  Resp:  (P) 18 20 18   Temp:  (P) 98.5 F (36.9 C) 97.9 F (36.6 C) 97.8 F (36.6 C)  TempSrc:  (P) Oral Oral   SpO2:  (P) 98% 96% 93%  Weight:      Height:       Physical Exam General: Appears comfortable; No acute respiratory distress Neck: Facial/Neck swelling improving Heart: Normal S1 and S2; No murmurs, gallops, or friction rub Lungs: Clear throughout; No wheezing, rales, or rhonchi Abdomen: Large, soft, non-tender, non-distended Extremities: No edema bilateral lower extremities. L arm swelling improving Dialysis Access: L AVG (+) B/T  Filed Weights   01/08/21 1648 01/09/21 1257 01/09/21 2000  Weight: 71 kg 70.6 kg 70.7 kg    Intake/Output Summary (Last 24 hours) at 01/11/2021 0906 Last data filed at 01/11/2021 0600 Gross per 24 hour  Intake 420 ml  Output 1200 ml  Net -780 ml    Additional Objective Labs: Basic Metabolic Panel: Recent Labs  Lab 01/08/21 1807 01/10/21 0247  NA 138 135  K 3.2* 3.4*  CL 93* 93*  CO2 32 30  GLUCOSE 116* 161*  BUN 9 20  CREATININE 3.68* 6.37*  CALCIUM 8.6* 9.2   CBC: Recent Labs  Lab 01/08/21 1807 01/10/21 0247  WBC 5.7 6.0  HGB 11.0* 10.9*  HCT 34.4* 34.2*  MCV 94.8 95.0  PLT 141* 141*   CBG: Recent Labs  Lab  01/10/21 0636 01/10/21 1154 01/10/21 1612 01/10/21 2048 01/11/21 0720  GLUCAP 160* 156* 195* 230* 176*   Studies/Results: PERIPHERAL VASCULAR CATHETERIZATION  Result Date: 01/10/2021  PATIENT: Amy [name]                                                              MRN: [id number] DOB: 1945/12/12                                              DATE OF PROCEDURE: 01/10/2021  INDICATIONS:   MYKENZIE EBANKS is a 75 y.o. female with a left upper arm AV graft.  She presented with some facial and neck swelling and presents for a fistulogram.  PROCEDURE:   1.  Conscious sedation 2.  Ultrasound-guided access to left AV graft 3.  Fistulogram left upper arm loop AV graft 4.  Drug-coated balloon angioplasty of anastomotic stenosis  SURGEON: Judeth Cornfield. Scot Dock, MD, FACS  ANESTHESIA: Local with sedation  EBL: Minimal  TECHNIQUE: The patient was brought  to the peripheral vascular lab and was sedated. The period of conscious sedation was 56 minutes.  During that time period, I was present face-to-face 100% of the time.  The patient was administered 1 mg of Versed and 25 mcg of fentanyl. The patient's heart rate, blood pressure, and oxygen saturation were monitored by the nurse continuously during the procedure.  The left arm was prepped and draped in usual sterile fashion.  It was somewhat difficult to palpate the venous limb of the graft so I used ultrasound to identify the venous limb of the graft.  After the skin was anesthetized under ultrasound guidance I cannulated the venous limb of the graft with a micropuncture needle and a micropuncture sheath was introduced over a wire.  There was no clot in the graft at this level.  A real-time image was sent to the server.  I then placed a micropuncture sheath.  A fistulogram was done to a valuate the graft from the point of cannulation to include the central veins.  There was no central venous stenosis.  There was a 70% narrowing at the venous anastomosis  and I elected to address this with venoplasty.  I upsized to a 6 [place] sheath and the patient received 2000 units of IV heparin.  I initially selected a 6 mm x 40 mm balloon which was inflated to 20 atm for 1 minute.  There was some residual stenosis.  I went back with an 8 mm x 40 mm balloon and inflated this to 18 atm for 1 minute.  This showed less than 15% residual stenosis.  I would then went back with a 8 mm x 40 mm drug-coated balloon and this was inflated extending slightly past the previous area of angioplasty and was inflated to 6 atm for 2-1/2 minutes.  Completion film showed an excellent result with less than 15% residual stenosis.  A 4-0 Monocryl was placed around the cannulation site.  The patient tolerated the procedure well was transferred to the recovery room in stable condition.  All needle and sponge counts were correct.  FINDINGS:  1.  There is no central venous stenosis to explain her facial swelling. 2.  The left upper arm loop AV graft is widely patent. 3.  There is no stenosis at the arterial anastomosis. 4.  There was a 70% stenosis at the venous anastomosis that was successfully addressed with drug-coated angioplasty as described above.  [name], MD, FACS [facility]  DATE OF DICTATION:   01/10/2021     Medications:  . calcium acetate  667 mg Oral TID WC  . Chlorhexidine Gluconate Cloth  6 each Topical Q0600  . cinacalcet  30 mg Oral Q M,W,F-HD  . heparin  5,000 Units Subcutaneous Q8H  . insulin aspart  0-5 Units Subcutaneous QHS  . insulin aspart  0-6 Units Subcutaneous TID WC  . multivitamin  1 tablet Oral QHS    Dialysis Orders: [facility] MWF 3 hr 45 min 180NRe 400/500 70.5 kg 2.0K/2.0 Ca AVG (NOT a HERO Graft!) -Heparin 7000 units IV TIW -Mircera 30 mcg IV q 4 weeks (last dose Mircera 50 mcg IV 12/30/20) -Cinacalcet 30 mg PO TIW  Assessment/Plan: 1. Swelling: LUE, Face, Neck: S/P Fistulogram on 01/10/21 (Dr. Scot Dock)  completed today which showed: 70% stenosis at the venous anastomosis, addressed with drug-coated angioplasty. No stenosis at the arterial anastomosis and no central venous stenosis. L AVG is widely patent. Patient tolerated HD yesterday. Facial, neck, and L  arm swelling is improving.2. ESRD -MWF: Tolerated HD yesterday 01/10/21 with UF 1.2L. K+ at 3.4. May have to change to [place] bath as outpatient. Will monitor K+ trends.  3. Anemia of CKD- Hgb 10.9. Will follow Hgb trends. 4. Secondary hyperparathyroidism - Last OP labs at goal. Continue Calcium acetate and Sensipar. 5. HTN/volume - BP controlled, patient euvolemic on exam.  6. Nutrition - Advance to renal diet as tolerated. 7. Dispo: Spoke with Dr. Sloan Leiter today, plan to d/c patient home if okay from our end. I believe she is medically stable to be discharge. Discussed with Attending Dr. Hollie Salk, patient is medically stable for discharge from renal standpoint.  Tobie Poet, NP [facility] 01/11/2021,9:06 AM  LOS: 1 day

## 2021-01-13 ENCOUNTER — Telehealth: Payer: Self-pay

## 2021-01-13 MED FILL — Lidocaine HCl Local Preservative Free (PF) Inj 1%: INTRAMUSCULAR | Qty: 30 | Status: AC

## 2021-01-13 MED FILL — Heparin Sod (Porcine)-NaCl IV Soln 1000 Unit/500ML-0.9%: INTRAVENOUS | Qty: 1000 | Status: AC

## 2021-01-13 NOTE — Telephone Encounter (Signed)
Transition Care Management Follow-up Telephone Call  Date of discharge and from where: [facility] on 01/11/21  How have you been since you were released from the hospital? Doing ok but does not feel any better or worse. Pt states she is still swollen and has not seen a change in her face. Pt declines SOB but states she has drainage in her throat. Pt got choked up while eating last night but thinks that it due to eating too fast. Declines pain, SOB, fever, sputum or n/v/d. Pt slept well last night but was woken up several times due to having to clear her throat. Pt feels her appetite has decreased.   Any questions or concerns? No   Items Reviewed:  Did the pt receive and understand the discharge instructions provided? Yes   Medications obtained and verified? Declined reviewing at this time. Will review at [place] apt.  Any new allergies since your discharge? No   Dietary orders reviewed? Yes  Do you have support at home? Yes   Other (ie: DME, Home Health, etc):  N/A  Functional Questionnaire: (I = Independent and D = Dependent)  Bathing/Dressing- Needs assistance dressing and bathing.   Meal Prep- I  Eating- I  Maintaining continence- I  Transferring/Ambulation- I  Managing Meds- I   Follow up appointments reviewed:    PCP Hospital f/u appt confirmed? No  , pt to call the office and schedule a HFU today.   [facility] f/u appt confirmed? Yes    Are transportation arrangements needed? No   If their condition worsens, is the pt aware to call  their PCP or go to the ED? Yes  Was the patient provided with contact information for the PCP's office or ED? Yes  Was the pt encouraged to call back with questions or concerns? Yes

## 2021-01-13 NOTE — Telephone Encounter (Signed)
Transition of care contact from inpatient facility  Date of discharge: 01/11/21 Date of contact: 01/13/21 Method: Phone Spoke to: Patient  Patient contacted to discuss transition of care from recent inpatient hospitalization. Patient was admitted to [facility] from 01/08/21 to 01/11/21... with discharge diagnosis of  Neck/ Facial swelling related  To Left upper extremity AVF sp PTA of venous  stenosis with improved swelling  Medication changes were reviewed.  Patient will follow up with his/her outpatient HD unit on:  Wed  01/15/21

## 2021-01-16 ENCOUNTER — Ambulatory Visit (INDEPENDENT_AMBULATORY_CARE_PROVIDER_SITE_OTHER): Payer: Medicare Other | Admitting: Family Medicine

## 2021-01-16 ENCOUNTER — Other Ambulatory Visit: Payer: Self-pay

## 2021-01-16 ENCOUNTER — Encounter: Payer: Self-pay | Admitting: Family Medicine

## 2021-01-16 VITALS — BP 120/64 | HR 79 | Temp 97.0°F | Ht 66.0 in | Wt 152.6 lb

## 2021-01-16 DIAGNOSIS — Z992 Dependence on renal dialysis: Secondary | ICD-10-CM | POA: Diagnosis not present

## 2021-01-16 DIAGNOSIS — N186 End stage renal disease: Secondary | ICD-10-CM | POA: Diagnosis not present

## 2021-01-16 DIAGNOSIS — R220 Localized swelling, mass and lump, head: Secondary | ICD-10-CM | POA: Diagnosis not present

## 2021-01-16 NOTE — Progress Notes (Signed)
[facility] [address] [place] [address] Dept: [phone] Dept Fax: [phone]  Acute Office Visit  Subjective:    Patient ID: Amy [name], female    DOB: 1946-07-29, 75 y.o..   MRN: [id number]  Chief Complaint  Patient presents with  . Hospitalization Follow-up    Hospital f/u (01/08/21) for face/chest/arms swelling.  Swelling has gotten some better.     History of Present Illness:  Patient is in today for follow-up from a recent hospitalization. She was admitted on 01/08/2021 with concerns for facial and neck swelling. Not long before this, she had started having dialysis through an AV graft in the left upper arm. While in the hospital, they performed neck and chest CTs cans and a study to assess patency of her AV graft. Apparently, she was found to have stenosis of the venous anastomosis and had this dilated. Overall, she was felt to be retaining fluid, which was resolving with dialysis.  Since returning home, she has noted some ongoing puffiness to her face and neck. She notes that yesterday, she was feeling a bit more ill. She had an episode of vomiting at dialysis. She notes that overall her appetitie has been poor. She took a laxative last night and was up several times during the night with the later bowel movements being more watery. She also describes some issues with vaginal odor. She denies any discharge, burning, or itching. She was concerned that she may have been wiping back-to-front and caused this. She is trying a different soap now to hopefully resolve this.   Past Medical History: Patient Active Problem List   Diagnosis Date Noted  . Neck swelling 01/10/2021  . Dyslipidemia 01/09/2021  . Angio-edema 01/08/2021  . Primary osteoarthritis of right knee 12/10/2020  . Diarrhea, unspecified 07/03/2020  . Pain, unspecified 07/03/2020  . Unspecified disturbances of skin sensation 07/03/2020  . Type 2  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, with long-term current use of insulin ([place]) 05/23/2020  . Slow transit constipation 05/09/2020  . Drug-induced constipation 05/09/2020  . Acidosis 05/01/2020  . 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 Encounter for adequacy testing for peritoneal dialysis ([place]) 05/01/2020  . Liver disease, 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  . Other dietary vitamin B12 deficiency anemia 05/01/2020  . Other disorders of electrolyte and fluid balance, not elsewhere classified 05/01/2020  . Other disorders resulting from impaired renal tubular function 05/01/2020  . Other long term (current) drug therapy 05/01/2020  . Essential hypertension 01/16/2020  . 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 Bilateral carpal tunnel syndrome 01/16/2020  . Abnormality of albumin 12/07/2019  . Allergy, unspecified, initial encounter 11/27/2019  . Anaphylactic shock, unspecified, initial encounter 11/27/2019  . Mild protein-calorie malnutrition ([place]) 09/25/2019  . Iron deficiency anemia, unspecified 09/22/2019  . Coagulation defect, unspecified ([place]) 09/19/2019  . Dependence on renal dialysis ([place]) 09/19/2019  . 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[place]) 09/19/2019  . Shortness of breath 09/19/2019  . Hyperkalemia 07/20/2019  . Hypocalcemia 07/20/2019  . Metabolic acidosis, increased anion gap 07/20/2019  . Nausea and vomiting 07/20/2019  . 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[place]) 04/11/2017  . Diabetic neuropathy ([place]) 04/15/2016  . Hematuria 04/15/2016  . Proteinuria due to type 2 diabetes mellitus ([place]) 04/15/2016  . 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 01/29/2016   Past Surgical History:  Procedure Laterality Date  . A/V FISTULAGRAM Left 01/10/2021   Procedure: A/V FISTULAGRAM;  Surgeon: [name]  S, MD;  Location: [facility];  Service: Cardiovascular;  Laterality: Left;  . AV FISTULA PLACEMENT Left  09/24/2020   Procedure: LEFT UPPER ARM ARTERIOVENOUS GORTEX  GRAFT;  Surgeon: Elam Dutch, MD;  Location: [facility];  Service: Vascular;  Laterality: Left;  . BREAST CYST EXCISION Right 2011   cyst removed -neg  . EYE SURGERY Bilateral    cataracts  . INSERTION OF DIALYSIS CATHETER Right 07/2019  . PERIPHERAL VASCULAR BALLOON ANGIOPLASTY  01/10/2021   Procedure: PERIPHERAL VASCULAR BALLOON ANGIOPLASTY;  Surgeon: Angelia Mould, MD;  Location: [facility];  Service: Cardiovascular;;  left AV graft  . TUBAL LIGATION     Family History  Problem Relation Age of Onset  . Breast cancer Neg Hx    Outpatient Medications Prior to Visit  Medication Sig Dispense Refill  . B Complex-C-Folic Acid (RENA-VITE RX) 1 MG TABS Take 1 tablet by mouth daily.    . blood glucose meter kit and supplies KIT Dispense based on patient and insurance preference. Use up to four times daily as directed. (FOR ICD-9 250.00, 250.01). 1 each 0  . calcium acetate (PHOSLO) 667 MG capsule Take 667 mg by mouth 3 (three) times daily with meals.    . cholecalciferol (VITAMIN D3) 25 MCG (1000 UNIT) tablet Take 1,000 Units by mouth daily.    . cinacalcet (SENSIPAR) 30 MG tablet Take 30 mg by mouth daily.    . Continuous Blood Gluc Sensor (FREESTYLE LIBRE 14 DAY SENSOR) MISC 1 Units by Does not apply route every 14 (fourteen) days. 2 each 0  . Methoxy PEG-Epoetin Beta (MIRCERA IJ) Mircera    . polyethylene glycol powder (MIRALAX) 17 GM/SCOOP powder Take 1 Container by mouth once.    [name] ethyl chloride spray Apply 1 application topically 3 (three) times a week. Dialysis days (Patient not taking: Reported on 01/16/2021)     No facility-administered medications prior to visit.    Allergies  Allergen Reactions  . Sensipar [Cinacalcet] Other (See Comments)    Other reaction(s): Unknown    Objective:   Today's Vitals   01/16/21 1042  BP: 120/64  Pulse: 79  Temp: (!) 97 F (36.1 C)  TempSrc: Temporal  SpO2:  97%  Weight: 152 lb 9.6 oz (69.2 kg)  Height: 5' 6"  (1.676 m)   Body mass index is 24.63 kg/m.   General: Well developed, well nourished. No acute distress. HEENT: Face is mildly puffy, but with no pitting edema. Neck: Supple. No lymphadenopathy. Extremities: Removed dressings from left upper arm. AV graft access points are healing without sign of   infection. No LE edema noted. Psych: Alert and oriented. Normal mood and affect.  Health Maintenance Due  Topic Date Due  . HEMOGLOBIN A1C  Never done  . Hepatitis C Screening  Never done  . FOOT EXAM  Never done  . OPHTHALMOLOGY EXAM  Never done  . URINE MICROALBUMIN  Never done  . TETANUS/TDAP  Never done  . COLONOSCOPY (Pts 45-71yr Insurance coverage will need to be confirmed)  Never done  . DEXA SCAN  Never done  . PNA vac Low Risk Adult (1 of 2 - PCV13) Never done  . MAMMOGRAM  07/01/2018  . COVID-19 Vaccine (2 - Booster for Janssen series) 04/22/2020     Assessment & Plan:   1. Facial swelling I reviewed hospital discharge summary and results of CT of chest, CT of neck, and Venous [place] right arm. Amy [name]'s weight  is down 6 lbs. from her last visit. I believe this is mostly accounted for by removing excess fluid. She may take longer to resolve this. She has an appointment with the nephrologist tomorrow. I urged her to discuss this with her kidney specialist. She will follow-up in the clinic on May 17 for an annual assessment.  2. 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 Siddoway asked me if she will ever feel well again. We discussed the chronic nature of her kidney disease and the reality that her kidneys will not improve. We also discussed the limitations of dialysis for replacing all fo the functions of her kidneys when they were normal. I encouraged her to find what joy she can int he days when she feels well. I also urged her to be as active as she can, when her situation will allow. We discussed using a 1/2 dose  of her laxative next time as she seems sensitive to the current dose. We will wait and see if her change in soaps resolves the vaginal odor issue.  Haydee Salter, MD

## 2021-01-18 ENCOUNTER — Encounter (HOSPITAL_COMMUNITY): Payer: Self-pay | Admitting: Emergency Medicine

## 2021-01-18 ENCOUNTER — Emergency Department (HOSPITAL_COMMUNITY)
Admission: EM | Admit: 2021-01-18 | Discharge: 2021-01-18 | Disposition: A | Discharge status: Home / Self Care | Payer: Medicare Other | Attending: Emergency Medicine | Admitting: Emergency Medicine

## 2021-01-18 ENCOUNTER — Emergency Department (HOSPITAL_COMMUNITY): Payer: Medicare Other

## 2021-01-18 DIAGNOSIS — R410 Disorientation, unspecified: Secondary | ICD-10-CM | POA: Diagnosis not present

## 2021-01-18 DIAGNOSIS — R220 Localized swelling, mass and lump, head: Secondary | ICD-10-CM | POA: Diagnosis present

## 2021-01-18 DIAGNOSIS — Z794 Long term (current) use of insulin: Secondary | ICD-10-CM | POA: Diagnosis not present

## 2021-01-18 DIAGNOSIS — N186 End stage renal disease: Secondary | ICD-10-CM | POA: Insufficient documentation

## 2021-01-18 DIAGNOSIS — R531 Weakness: Secondary | ICD-10-CM

## 2021-01-18 DIAGNOSIS — Z87891 Personal history of nicotine dependence: Secondary | ICD-10-CM | POA: Insufficient documentation

## 2021-01-18 DIAGNOSIS — E1122 Type 2 diabetes mellitus with diabetic chronic kidney disease: Secondary | ICD-10-CM | POA: Diagnosis not present

## 2021-01-18 DIAGNOSIS — R221 Localized swelling, mass and lump, neck: Secondary | ICD-10-CM | POA: Diagnosis not present

## 2021-01-18 DIAGNOSIS — I120 Hypertensive chronic kidney disease with stage 5 chronic kidney disease or end stage renal disease: Secondary | ICD-10-CM | POA: Insufficient documentation

## 2021-01-18 DIAGNOSIS — Z992 Dependence on renal dialysis: Secondary | ICD-10-CM | POA: Insufficient documentation

## 2021-01-18 LAB — CBC WITH DIFFERENTIAL/PLATELET
Abs Immature Granulocytes: 0.01 10*3/uL (ref 0.00–0.07)
Basophils Absolute: 0 10*3/uL (ref 0.0–0.1)
Basophils Relative: 1 %
Eosinophils Absolute: 0.3 10*3/uL (ref 0.0–0.5)
Eosinophils Relative: 6 %
HCT: 31.6 % — ABNORMAL LOW (ref 36.0–46.0)
Hemoglobin: 9.8 g/dL — ABNORMAL LOW (ref 12.0–15.0)
Immature Granulocytes: 0 %
Lymphocytes Relative: 39 %
Lymphs Abs: 2.1 10*3/uL (ref 0.7–4.0)
MCH: 30 pg (ref 26.0–34.0)
MCHC: 31 g/dL (ref 30.0–36.0)
MCV: 96.6 fL (ref 80.0–100.0)
Monocytes Absolute: 0.5 10*3/uL (ref 0.1–1.0)
Monocytes Relative: 10 %
Neutro Abs: 2.4 10*3/uL (ref 1.7–7.7)
Neutrophils Relative %: 44 %
Platelets: 142 10*3/uL — ABNORMAL LOW (ref 150–400)
RBC: 3.27 MIL/uL — ABNORMAL LOW (ref 3.87–5.11)
RDW: 14.4 % (ref 11.5–15.5)
WBC: 5.4 10*3/uL (ref 4.0–10.5)
nRBC: 0 % (ref 0.0–0.2)

## 2021-01-18 LAB — LIPASE, BLOOD: Lipase: 31 U/L (ref 11–51)

## 2021-01-18 LAB — URINALYSIS, ROUTINE W REFLEX MICROSCOPIC
Bacteria, UA: NONE SEEN
Bilirubin Urine: NEGATIVE
Glucose, UA: 50 mg/dL — AB
Hgb urine dipstick: NEGATIVE
Ketones, ur: NEGATIVE mg/dL
Leukocytes,Ua: NEGATIVE
Nitrite: NEGATIVE
Protein, ur: 100 mg/dL — AB
Specific Gravity, Urine: 1.015 (ref 1.005–1.030)
pH: 8 (ref 5.0–8.0)

## 2021-01-18 LAB — COMPREHENSIVE METABOLIC PANEL
ALT: 9 U/L (ref 0–44)
AST: 17 U/L (ref 15–41)
Albumin: 3.6 g/dL (ref 3.5–5.0)
Alkaline Phosphatase: 114 U/L (ref 38–126)
Anion gap: 8 (ref 5–15)
BUN: 6 mg/dL — ABNORMAL LOW (ref 8–23)
CO2: 36 mmol/L — ABNORMAL HIGH (ref 22–32)
Calcium: 8.9 mg/dL (ref 8.9–10.3)
Chloride: 94 mmol/L — ABNORMAL LOW (ref 98–111)
Creatinine, Ser: 5.65 mg/dL — ABNORMAL HIGH (ref 0.44–1.00)
GFR, Estimated: 7 mL/min — ABNORMAL LOW (ref >60)
Glucose, Bld: 134 mg/dL — ABNORMAL HIGH (ref 70–99)
Potassium: 3.3 mmol/L — ABNORMAL LOW (ref 3.5–5.1)
Sodium: 138 mmol/L (ref 135–145)
Total Bilirubin: 0.5 mg/dL (ref 0.3–1.2)
Total Protein: 6.9 g/dL (ref 6.5–8.1)

## 2021-01-18 NOTE — Discharge Instructions (Addendum)
You were seen in the emergency department for evaluation of face and neck swelling and some weakness.  You had blood work chest x-ray labs and urinalysis that did not show any significant findings.  Please follow-up with your primary care doctor.  Return to the emergency department for any worsening or concerning symptoms.

## 2021-01-18 NOTE — ED Triage Notes (Signed)
Pt reports facial swelling x 2 weeks.  States she had a blood clot in R upper arm reports last week and was told the facial swelling would improve.  Also reports feeling like a "Clot" in throat.  Denies pain.

## 2021-01-18 NOTE — ED Notes (Signed)
Pt placed on bedpan

## 2021-01-18 NOTE — ED Notes (Signed)
Pt refusing to allow catheter.

## 2021-01-18 NOTE — ED Provider Notes (Signed)
[facility] EMERGENCY DEPARTMENT Provider Note   CSN: [id number] Arrival date & time: 01/18/21  1143     History Chief Complaint  Patient presents with  . Facial Swelling    Amy [name] is a 75 y.o. female.  Patient brought in by her son.  Patient is concerned about her neck and facial swelling.  Saying this making it difficult for her to breathe.  However her son is concerned as he is worried about a urinary tract infection because has been a little bit of increased confusion some sort of generalized weakness.  Son states that the neck swelling and facial swelling is been ongoing for a couple months.  She was admitted to the hospital on March 16 for evaluation of this.  Around the time that this started she started having dialysis through an AV graft in her left upper arm.  While in the hospital they performed neck and chest CT scans and a study to access the patency of her AV graft.  Apparently she was found to have stenosis of the of any venous anastomosis and this was dilated.  Overall she was felt to be retaining fluid which was resolving with dialysis.  All this as per her primary care doctor's note and she is followed [facility] and they saw her on March 24.  The thought was that dialysis will help remove this fluid accumulation over time.  Patient satting 100% here on room air and talking fine no stridor.  Verbalizing very well.  Shin is a dialysis patient normally receives dialysis Monday Wednesday and Friday.  Patient was dialyzed Friday.  Her AV graft is in her left arm.        Past Medical History:  Diagnosis Date  . Chronic kidney disease    dialysis M-W-F - dialysis cath located in right side of chest  . Diabetes mellitus without complication (HCC)    type 2 - no meds   . HLD (hyperlipidemia)    diet controlled  . Hypertension    no meds    Patient Active Problem List   Diagnosis Date Noted  . Neck swelling 01/10/2021  .  Dyslipidemia 01/09/2021  . Angio-edema 01/08/2021  . Primary osteoarthritis of right knee 12/10/2020  . Diarrhea, unspecified 07/03/2020  . Pain, unspecified 07/03/2020  . Unspecified disturbances of skin sensation 07/03/2020  . Type 2 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, with long-term current use of insulin (HCC) 05/23/2020  . Slow transit constipation 05/09/2020  . Drug-induced constipation 05/09/2020  . Acidosis 05/01/2020  . 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 Encounter for adequacy testing for peritoneal dialysis (HCC) 05/01/2020  . Liver disease, 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  . Other dietary vitamin B12 deficiency anemia 05/01/2020  . Other disorders of electrolyte and fluid balance, not elsewhere classified 05/01/2020  . Other disorders resulting from impaired renal tubular function 05/01/2020  . Other long term (current) drug therapy 05/01/2020  . Essential hypertension 01/16/2020  . 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 Bilateral carpal tunnel syndrome 01/16/2020  . Abnormality of albumin 12/07/2019  . Allergy, unspecified, initial encounter 11/27/2019  . Anaphylactic shock, unspecified, initial encounter 11/27/2019  . Mild protein-calorie malnutrition (HCC) 09/25/2019  . Iron deficiency anemia, unspecified 09/22/2019  . Coagulation defect, unspecified (HCC) 09/19/2019  . Dependence on renal dialysis (HCC) 09/19/2019  . 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HCC) 09/19/2019  . Shortness of breath 09/19/2019  .   Hyperkalemia 07/20/2019  . Hypocalcemia 07/20/2019  . Metabolic acidosis, increased anion gap 07/20/2019  . Nausea and vomiting 07/20/2019  . 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[place]) 04/11/2017  . Diabetic neuropathy ([place]) 04/15/2016  . Hematuria 04/15/2016  . Proteinuria due to type 2 diabetes mellitus ([place]) 04/15/2016  . 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 01/29/2016    Past Surgical  History:  Procedure Laterality Date  . A/V FISTULAGRAM Left 01/10/2021   Procedure: A/V FISTULAGRAM;  Surgeon: Angelia Mould, MD;  Location: [facility];  Service: Cardiovascular;  Laterality: Left;  . AV FISTULA PLACEMENT Left 09/24/2020   Procedure: LEFT UPPER ARM ARTERIOVENOUS GORTEX  GRAFT;  Surgeon: Elam Dutch, MD;  Location: [facility];  Service: Vascular;  Laterality: Left;  . BREAST CYST EXCISION Right 2011   cyst removed -neg  . EYE SURGERY Bilateral    cataracts  . INSERTION OF DIALYSIS CATHETER Right 07/2019  . PERIPHERAL VASCULAR BALLOON ANGIOPLASTY  01/10/2021   Procedure: PERIPHERAL VASCULAR BALLOON ANGIOPLASTY;  Surgeon: Angelia Mould, MD;  Location: [facility];  Service: Cardiovascular;;  left AV graft  . TUBAL LIGATION       OB History   No obstetric history on file.     Family History  Problem Relation Age of Onset  . Breast cancer Neg Hx     Social History   Tobacco Use  . Smoking status: Former Research scientist (life sciences)  . Smokeless tobacco: Never Used  Vaping Use  . Vaping Use: Never used  Substance Use Topics  . Alcohol use: Yes    Comment: occasional  . Drug use: Not Currently    Types: Marijuana    Comment: Last use 2020    Home Medications Prior to Admission medications   Medication Sig Start Date End Date Taking? Authorizing Provider  B Complex-C-Folic Acid (RENA-VITE RX) 1 MG TABS Take 1 tablet by mouth daily. 10/17/20   [provider]  blood glucose meter kit and supplies KIT Dispense based on patient and insurance preference. Use up to four times daily as directed. (FOR ICD-9 250.00, 250.01). 06/10/20   [name], MD  calcium acetate (PHOSLO) 667 MG capsule Take 667 mg by mouth 3 (three) times daily with meals. 01/02/20   [provider]  cholecalciferol (VITAMIN D3) 25 MCG (1000 UNIT) tablet Take 1,000 Units by mouth daily.    [provider]  cinacalcet (SENSIPAR) 30 MG tablet Take 30 mg  by mouth daily. 11/03/20   [provider]  Continuous Blood Gluc Sensor (FREESTYLE LIBRE 14 DAY SENSOR) MISC 1 Units by Does not apply route every 14 (fourteen) days. 05/23/20   Nche, Charlene Brooke, NP  ethyl chloride spray Apply 1 application topically 3 (three) times a week. Dialysis days Patient not taking: Reported on 01/16/2021 12/17/20   [provider]  Methoxy PEG-Epoetin Beta (MIRCERA IJ) Mircera 02/12/20 02/10/21  [provider]  polyethylene glycol powder (MIRALAX) 17 GM/SCOOP powder Take 1 Container by mouth once.    [provider]    Allergies    Sensipar [cinacalcet]  Review of Systems   Review of Systems  Constitutional: Negative for chills and fever.  HENT: Positive for facial swelling. Negative for rhinorrhea and sore throat.   Eyes: Negative for visual disturbance.  Respiratory: Negative for cough and shortness of breath.   Cardiovascular: Negative for chest pain and leg swelling.  Gastrointestinal: Negative for abdominal pain, diarrhea, nausea and vomiting.  Genitourinary: Negative for dysuria.  Musculoskeletal: Negative for back pain and neck pain.  Skin: Negative for rash.  Neurological: Positive for weakness. Negative for dizziness, light-headedness and headaches.  Hematological: Does not bruise/bleed easily.  Psychiatric/Behavioral: Positive for confusion.    Physical Exam Updated Vital Signs BP 113/66   Pulse 85   Temp 98.2 F (36.8 C) (Oral)   Resp 18   LMP  (LMP Unknown)   SpO2 100%   Physical Exam Vitals and nursing note reviewed.  Constitutional:      General: She is not in acute distress.    Appearance: Normal appearance. She is well-developed.  HENT:     Head: Normocephalic.     Comments: Patient does have soft tissue swelling mostly up under the jaw area in the anterior neck and towards her left arm over the left clavicle area.  This is soft nontender no erythema.  No crepitance.    Mouth/Throat:     Mouth:  Mucous membranes are moist.     Pharynx: Oropharynx is clear.  Eyes:     Extraocular Movements: Extraocular movements intact.     Conjunctiva/sclera: Conjunctivae normal.     Pupils: Pupils are equal, round, and reactive to light.  Cardiovascular:     Rate and Rhythm: Normal rate and regular rhythm.     Heart sounds: No murmur heard.   Pulmonary:     Effort: Pulmonary effort is normal. No respiratory distress.     Breath sounds: Normal breath sounds.  Abdominal:     Palpations: Abdomen is soft.     Tenderness: There is no abdominal tenderness.  Musculoskeletal:        General: No swelling. Normal range of motion.     Cervical back: Normal range of motion and neck supple.  Skin:    General: Skin is warm and dry.  Neurological:     General: No focal deficit present.     Mental Status: She is alert. Mental status is at baseline.     Cranial Nerves: No cranial nerve deficit.     Sensory: No sensory deficit.     Motor: No weakness.     ED Results / Procedures / Treatments   Labs (all labs ordered are listed, but only abnormal results are displayed) Labs Reviewed  CBC WITH DIFFERENTIAL/PLATELET - Abnormal; Notable for the following components:      Result Value   RBC 3.27 (*)    Hemoglobin 9.8 (*)    HCT 31.6 (*)    Platelets 142 (*)    All other components within normal limits  COMPREHENSIVE METABOLIC PANEL  LIPASE, BLOOD  URINALYSIS, ROUTINE W REFLEX MICROSCOPIC    EKG EKG Interpretation  Date/Time:  Saturday January 18 2021 16:31:56 EDT Ventricular Rate:  83 PR Interval:    QRS Duration: 94 QT Interval:  420 QTC Calculation: 494 R Axis:   -52 Text Interpretation: Sinus rhythm Inferior infarct, old Anterior infarct, old No significant change since prior 10/13 Confirmed by Aletta Edouard [phone]) on 01/18/2021 4:34:42 PM   Radiology DG Chest Port 1 View  Result Date: 01/18/2021 CLINICAL DATA:  Weakness and confusion. EXAM: PORTABLE CHEST 1 VIEW COMPARISON:   01/08/2021 chest CT and prior studies FINDINGS: The cardiomediastinal silhouette is unremarkable. RIGHT hemidiaphragm elevation and RIGHT basilar scarring again noted. There is no evidence of focal airspace disease, pulmonary edema, suspicious pulmonary nodule/mass, pleural effusion, or pneumothorax. No acute bony abnormalities are identified. IMPRESSION: No active disease. Electronically Signed   By: Cleatis Polka.D.  On: 01/18/2021 16:09    Procedures Procedures  Medications Ordered in ED Medications - No data to display  ED Course  I have reviewed the triage vital signs and the nursing notes.  Pertinent labs & imaging results that were available during my care of the patient were reviewed by me and considered in my medical decision making (see chart for details).    MDM Rules/Calculators/A&P                         Discussed with her son that we will do a general evaluation check urine for urinary tract infection chest x-ray head CT and basic labs.  Patient has received the Covid test.  It was the [employer].  Patient is dialyzed Monday Wednesdays and Fridays.  She was dialyzed this past Friday.  Here patient without any evidence of any respiratory compromise or distress no stridor.  Patient talking well.  Patient without fever.  Vital signs without any acute abnormalities.  If labs and radiology studies have no acute findings.  Patient should be stable for discharge home.  Follow-up with her primary care doctor.  Final Clinical Impression(s) / ED Diagnoses Final diagnoses:  Facial swelling  Confusion  Weakness generalized    Rx / DC Orders ED Discharge Orders    None       Fredia Sorrow, MD 01/18/21 1701

## 2021-01-18 NOTE — ED Provider Notes (Signed)
Signout from Dr. Venita Sheffield.  75 year old female recently admitted for neck and facial swelling.  Son bring her up today for saying that she seems a little confused.  Getting some screening labs head CT urinalysis.  Likely can be discharged if no acute findings. Physical Exam  BP 113/66   Pulse 85   Temp 98.2 F (36.8 C) (Oral)   Resp 18   LMP  (LMP Unknown)   SpO2 100%   Physical Exam  ED Course/Procedures     Procedures  MDM  Labs and head CT unremarkable.  Urinalysis ultimately obtained and negative for signs of infection.  Reviewed with patient's son.  She will be discharged to follow-up with primary care doctor.  Return instructions discussed.       Hayden Rasmussen, MD 01/19/21 [phone]

## 2021-01-20 LAB — URINE CULTURE: Culture: NO GROWTH

## 2021-01-22 ENCOUNTER — Other Ambulatory Visit: Payer: Self-pay | Admitting: *Deleted

## 2021-01-22 DIAGNOSIS — R220 Localized swelling, mass and lump, head: Secondary | ICD-10-CM

## 2021-01-22 DIAGNOSIS — N185 Chronic kidney disease, stage 5: Secondary | ICD-10-CM

## 2021-01-22 DIAGNOSIS — N186 End stage renal disease: Secondary | ICD-10-CM

## 2021-01-22 DIAGNOSIS — Z992 Dependence on renal dialysis: Secondary | ICD-10-CM

## 2021-01-26 ENCOUNTER — Encounter (HOSPITAL_COMMUNITY): Payer: Self-pay | Admitting: *Deleted

## 2021-01-26 ENCOUNTER — Other Ambulatory Visit: Payer: Self-pay

## 2021-01-26 ENCOUNTER — Observation Stay (HOSPITAL_COMMUNITY)
Admission: EM | Admit: 2021-01-26 | Discharge: 2021-01-29 | Disposition: A | Discharge status: Home / Self Care | Payer: Medicare Other | Attending: Family Medicine | Admitting: Family Medicine

## 2021-01-26 DIAGNOSIS — E1122 Type 2 diabetes mellitus with diabetic chronic kidney disease: Secondary | ICD-10-CM | POA: Insufficient documentation

## 2021-01-26 DIAGNOSIS — Z79899 Other long term (current) drug therapy: Secondary | ICD-10-CM | POA: Diagnosis not present

## 2021-01-26 DIAGNOSIS — Z87891 Personal history of nicotine dependence: Secondary | ICD-10-CM | POA: Diagnosis not present

## 2021-01-26 DIAGNOSIS — D631 Anemia in chronic kidney disease: Secondary | ICD-10-CM | POA: Insufficient documentation

## 2021-01-26 DIAGNOSIS — Z20822 Contact with and (suspected) exposure to covid-19: Secondary | ICD-10-CM | POA: Diagnosis not present

## 2021-01-26 DIAGNOSIS — I120 Hypertensive chronic kidney disease with stage 5 chronic kidney disease or end stage renal disease: Secondary | ICD-10-CM | POA: Insufficient documentation

## 2021-01-26 DIAGNOSIS — Z992 Dependence on renal dialysis: Secondary | ICD-10-CM | POA: Insufficient documentation

## 2021-01-26 DIAGNOSIS — R220 Localized swelling, mass and lump, head: Principal | ICD-10-CM | POA: Diagnosis present

## 2021-01-26 DIAGNOSIS — N186 End stage renal disease: Secondary | ICD-10-CM | POA: Diagnosis not present

## 2021-01-26 LAB — PROTIME-INR
INR: 1 (ref 0.8–1.2)
Prothrombin Time: 12.4 seconds (ref 11.4–15.2)

## 2021-01-26 LAB — CBC WITH DIFFERENTIAL/PLATELET
Abs Immature Granulocytes: 0.01 10*3/uL (ref 0.00–0.07)
Basophils Absolute: 0 10*3/uL (ref 0.0–0.1)
Basophils Relative: 0 %
Eosinophils Absolute: 0.3 10*3/uL (ref 0.0–0.5)
Eosinophils Relative: 5 %
HCT: 33.2 % — ABNORMAL LOW (ref 36.0–46.0)
Hemoglobin: 10.2 g/dL — ABNORMAL LOW (ref 12.0–15.0)
Immature Granulocytes: 0 %
Lymphocytes Relative: 37 %
Lymphs Abs: 1.9 10*3/uL (ref 0.7–4.0)
MCH: 29.9 pg (ref 26.0–34.0)
MCHC: 30.7 g/dL (ref 30.0–36.0)
MCV: 97.4 fL (ref 80.0–100.0)
Monocytes Absolute: 0.4 10*3/uL (ref 0.1–1.0)
Monocytes Relative: 9 %
Neutro Abs: 2.4 10*3/uL (ref 1.7–7.7)
Neutrophils Relative %: 49 %
Platelets: 142 10*3/uL — ABNORMAL LOW (ref 150–400)
RBC: 3.41 MIL/uL — ABNORMAL LOW (ref 3.87–5.11)
RDW: 14.8 % (ref 11.5–15.5)
WBC: 5 10*3/uL (ref 4.0–10.5)
nRBC: 0 % (ref 0.0–0.2)

## 2021-01-26 LAB — BASIC METABOLIC PANEL
Anion gap: 9 (ref 5–15)
BUN: 15 mg/dL (ref 8–23)
CO2: 36 mmol/L — ABNORMAL HIGH (ref 22–32)
Calcium: 9.3 mg/dL (ref 8.9–10.3)
Chloride: 94 mmol/L — ABNORMAL LOW (ref 98–111)
Creatinine, Ser: 7.47 mg/dL — ABNORMAL HIGH (ref 0.44–1.00)
GFR, Estimated: 5 mL/min — ABNORMAL LOW (ref >60)
Glucose, Bld: 178 mg/dL — ABNORMAL HIGH (ref 70–99)
Potassium: 3.7 mmol/L (ref 3.5–5.1)
Sodium: 139 mmol/L (ref 135–145)

## 2021-01-26 LAB — TSH: TSH: 2.011 u[IU]/mL (ref 0.350–4.500)

## 2021-01-26 LAB — APTT: aPTT: 32 seconds (ref 24–36)

## 2021-01-26 NOTE — ED Provider Notes (Signed)
MSE was initiated and I personally evaluated the patient and placed orders (if any) at  8:01 PM on January 26, 2021.  Patient placed in Quick Look pathway, seen and evaluated   Chief Complaint: Facial swelling  HPI:   Patient is a 75 year old female with a past medical history significant for hemodialysis due to CKD, has a graft in her left arm for which dialysis is done.  She states her last dialysis was Friday and she is due for dialysis on Monday.  She said she had a full dialysis session on Friday.  She states that she has for approximately 3 weeks had swelling in her face.  She states that she was seen by her vascular doctor and a clot was removed from her graft.  She states that the swelling in her arm improved after that was done but now she feels that her facial swelling is worse.  She states that she feels that her swallowing is more difficult but states that she has had no difficulty swallowing water and managing her secretions.  Denies any shortness of breath although she states that she feels that the swelling in her neck is getting in the way.  ROS: Neck swelling, facial swelling (one) Denies headache, chest pain, shortness of breath lightheadedness or dizziness  Physical Exam:   Gen: No distress  Neuro: Awake and Alert  Skin: Warm    Focused Exam:  There was fullness of the neck on examination.  Patient is managing secretions and breathing normally.  No stridor no increased work of breathing speaking in full sentences.  Bilateral radial artery pulses are palpable.  She has palpable thrill in graft in left upper arm.  Good movement in bilateral upper extremities. Does not appear to be in any distress. Pleasant, calm, cooperative  There does appear to be some fullness in appearance of the neck.  Uncertain whether this is new she describes it is unchanged today from several days ago.   Initiation of care has begun. The patient has been counseled on the process, plan, and necessity  for staying for the completion/evaluation, and the remainder of the medical screening examination   The patient appears stable so that the remainder of the MSE may be completed by another provider.   Tedd Sias, [place] 01/26/21 2040    Arnaldo Natal, MD 01/26/21 2044

## 2021-01-26 NOTE — ED Triage Notes (Signed)
Left arm swelling since mid march which is related to a blood.  Pt had graft placed in left arm and states that this is when swelling in left arm began.  Pt states that she had a temporary HD cath removed from right subclavian and has had increasing swelling in her face since.  Pt also reports that she has had swelling in her throat since.  Her PCP and this ED has evaluated her for this. Pt states that this is getting worse.

## 2021-01-27 ENCOUNTER — Encounter (HOSPITAL_COMMUNITY): Payer: Self-pay | Admitting: Internal Medicine

## 2021-01-27 ENCOUNTER — Encounter (HOSPITAL_COMMUNITY)
Admission: EM | Disposition: A | Discharge status: Home / Self Care | Payer: Self-pay | Source: Home / Self Care | Attending: Emergency Medicine

## 2021-01-27 DIAGNOSIS — T82898A Other specified complication of vascular prosthetic devices, implants and grafts, initial encounter: Secondary | ICD-10-CM

## 2021-01-27 DIAGNOSIS — Z992 Dependence on renal dialysis: Secondary | ICD-10-CM

## 2021-01-27 DIAGNOSIS — R220 Localized swelling, mass and lump, head: Secondary | ICD-10-CM | POA: Diagnosis not present

## 2021-01-27 DIAGNOSIS — N186 End stage renal disease: Secondary | ICD-10-CM | POA: Diagnosis not present

## 2021-01-27 HISTORY — PX: A/V FISTULAGRAM: CATH118298

## 2021-01-27 HISTORY — PX: OTHER SURGICAL HISTORY: SHX169

## 2021-01-27 LAB — CBC
HCT: 33.2 % — ABNORMAL LOW (ref 36.0–46.0)
Hemoglobin: 10.1 g/dL — ABNORMAL LOW (ref 12.0–15.0)
MCH: 29.7 pg (ref 26.0–34.0)
MCHC: 30.4 g/dL (ref 30.0–36.0)
MCV: 97.6 fL (ref 80.0–100.0)
Platelets: 123 10*3/uL — ABNORMAL LOW (ref 150–400)
RBC: 3.4 MIL/uL — ABNORMAL LOW (ref 3.87–5.11)
RDW: 14.7 % (ref 11.5–15.5)
WBC: 6 10*3/uL (ref 4.0–10.5)
nRBC: 0 % (ref 0.0–0.2)

## 2021-01-27 LAB — RESP PANEL BY RT-PCR (FLU A&B, COVID) ARPGX2
Influenza A by PCR: NEGATIVE
Influenza B by PCR: NEGATIVE
SARS Coronavirus 2 by RT PCR: NEGATIVE

## 2021-01-27 LAB — CBG MONITORING, ED
Glucose-Capillary: 142 mg/dL — ABNORMAL HIGH (ref 70–99)
Glucose-Capillary: 136 mg/dL — ABNORMAL HIGH (ref 70–99)

## 2021-01-27 LAB — GLUCOSE, CAPILLARY
Glucose-Capillary: 118 mg/dL — ABNORMAL HIGH (ref 70–99)
Glucose-Capillary: 96 mg/dL (ref 70–99)

## 2021-01-27 LAB — CREATININE, SERUM
Creatinine, Ser: 8.04 mg/dL — ABNORMAL HIGH (ref 0.44–1.00)
GFR, Estimated: 5 mL/min — ABNORMAL LOW (ref >60)

## 2021-01-27 SURGERY — A/V FISTULAGRAM
Anesthesia: LOCAL

## 2021-01-27 MED ORDER — HEPARIN (PORCINE) IN NACL 1000-0.9 UT/500ML-% IV SOLN
INTRAVENOUS | Status: DC | PRN
  Administered 2021-01-27: 500 mL

## 2021-01-27 MED ORDER — IODIXANOL 320 MG/ML IV SOLN
INTRAVENOUS | Status: DC | PRN
  Administered 2021-01-27: 24 mL via INTRAVENOUS

## 2021-01-27 MED ORDER — ACETAMINOPHEN 325 MG PO TABS
650.0000 mg | ORAL_TABLET | Freq: Four times a day (QID) | ORAL | Status: DC | PRN
  Filled 2021-01-27: qty 2

## 2021-01-27 MED ORDER — HEPARIN SODIUM (PORCINE) 5000 UNIT/ML IJ SOLN
5000.0000 [IU] | Freq: Three times a day (TID) | INTRAMUSCULAR | Status: DC
  Administered 2021-01-27 – 2021-01-29 (×7): 5000 [IU] via SUBCUTANEOUS
  Filled 2021-01-27 (×5): qty 1

## 2021-01-27 MED ORDER — ACETAMINOPHEN 650 MG RE SUPP: 650.0000 mg | Freq: Four times a day (QID) | RECTAL | Status: DC | PRN

## 2021-01-27 MED ORDER — LIDOCAINE HCL (PF) 1 % IJ SOLN
INTRAMUSCULAR | Status: DC | PRN
  Administered 2021-01-27: 3 mL via INTRADERMAL

## 2021-01-27 MED ORDER — BISACODYL 10 MG RE SUPP
10.0000 mg | Freq: Once | RECTAL | Status: AC
  Administered 2021-01-27: 10 mg via RECTAL
  Filled 2021-01-27: qty 1

## 2021-01-27 MED ORDER — HEPARIN (PORCINE) IN NACL 1000-0.9 UT/500ML-% IV SOLN
INTRAVENOUS | Status: AC
  Filled 2021-01-27: qty 500

## 2021-01-27 SURGICAL SUPPLY — 8 items
COVER DOME SNAP 22 D (MISCELLANEOUS) ×2 IMPLANT
KIT MICROPUNCTURE NIT STIFF (SHEATH) ×1 IMPLANT
PROTECTION STATION PRESSURIZED (MISCELLANEOUS) ×2
SHEATH PROBE COVER 6X72 (BAG) ×2 IMPLANT
STATION PROTECTION PRESSURIZED (MISCELLANEOUS) ×1 IMPLANT
STOPCOCK MORSE 400PSI 3WAY (MISCELLANEOUS) ×2 IMPLANT
TRAY PV CATH (CUSTOM PROCEDURE TRAY) ×2 IMPLANT
TUBING CIL FLEX 10 FLL-RA (TUBING) ×2 IMPLANT

## 2021-01-27 NOTE — Progress Notes (Signed)
NEW ADMISSION NOTE New Admission Note:   Arrival Method: Bed Mental Orientation: A&O x4 Telemetry: 5M09   Assessment: Completed Skin: see flowsheet IV: Right hand Pain: none Tubes: none present Safety Measures: Safety Fall Prevention Plan has been given, discussed and signed Admission: Completed [facility] Orientation: Patient has been orientated to the room, unit and staff.  Family: none present  Orders have been reviewed and implemented. Will continue to monitor the patient. Call light has been placed within reach and bed alarm has been activated.   Mikki Santee, RN

## 2021-01-27 NOTE — Progress Notes (Incomplete)
Patient requesting something to eat, patient currently NPO.  RN paged Dr. Hal Hope to ask for diet change.

## 2021-01-27 NOTE — ED Notes (Signed)
Patient assisted to [facility].

## 2021-01-27 NOTE — ED Notes (Signed)
Called OR to see if patient was on the schedule for revision of shunt , update given to patient , case hasn't been posted yet.

## 2021-01-27 NOTE — H&P (View-Only) (Signed)
Hospital Consult    Reason for Consult: Left arm and face swelling Referring Physician: ED MRN #:  [id number]  History of Present Illness: This is a 75 y.o. female with history of end-stage renal disease, hypertension, hyperlipidemia, diabetes that vascular surgery has been consulted for recurrent left arm and face swelling.  She was initially seen by my partner Dr. Donzetta Matters on 01/09/2021.  She had gradual onset of face and arm swelling over several weeks.  She underwent left arm fistulogram on 01/10/2021.  She had about a 70% stenosis at the venous anastomosis in the upper arm loop graft.  She states that her arm swelling and her face swelling got better for about a week.  She is now started having recurrent symptoms that prompted her visit to the ED.  She also complains of dysphagia.  Past Medical History:  Diagnosis Date  . Chronic kidney disease    dialysis M-W-F - dialysis cath located in right side of chest  . Diabetes mellitus without complication (HCC)    type 2 - no meds   . HLD (hyperlipidemia)    diet controlled  . Hypertension    no meds    Past Surgical History:  Procedure Laterality Date  . A/V FISTULAGRAM Left 01/10/2021   Procedure: A/V FISTULAGRAM;  Surgeon: Angelia Mould, MD;  Location: [facility];  Service: Cardiovascular;  Laterality: Left;  . AV FISTULA PLACEMENT Left 09/24/2020   Procedure: LEFT UPPER ARM ARTERIOVENOUS GORTEX  GRAFT;  Surgeon: Elam Dutch, MD;  Location: [facility];  Service: Vascular;  Laterality: Left;  . BREAST CYST EXCISION Right 2011   cyst removed -neg  . EYE SURGERY Bilateral    cataracts  . INSERTION OF DIALYSIS CATHETER Right 07/2019  . PERIPHERAL VASCULAR BALLOON ANGIOPLASTY  01/10/2021   Procedure: PERIPHERAL VASCULAR BALLOON ANGIOPLASTY;  Surgeon: Angelia Mould, MD;  Location: [facility];  Service: Cardiovascular;;  left AV graft  . TUBAL LIGATION      Allergies  Allergen Reactions  . Sensipar  [Cinacalcet] Other (See Comments)    Other reaction(s): Unknown    Prior to Admission medications   Medication Sig Start Date End Date Taking? Authorizing Provider  acetaminophen (TYLENOL) 325 MG tablet Take 650 mg by mouth every 6 (six) hours as needed for moderate pain or headache.   Yes [provider]  blood glucose meter kit and supplies KIT Dispense based on patient and insurance preference. Use up to four times daily as directed. (FOR ICD-9 250.00, 250.01). 06/10/20  Yes [name], MD  calcium acetate (PHOSLO) 667 MG capsule Take 667 mg by mouth 3 (three) times daily with meals. 01/02/20  Yes [provider]  Continuous Blood Gluc Sensor (FREESTYLE LIBRE 14 DAY SENSOR) MISC 1 Units by Does not apply route every 14 (fourteen) days. 05/23/20  Yes Nche, Charlene Brooke, NP  diclofenac Sodium (VOLTAREN) 1 % GEL Apply 2-4 g topically 4 (four) times daily as needed (knee pain).   Yes [provider]  lactulose (CHRONULAC) 10 GM/15ML solution Take 60 mLs by mouth daily as needed for mild constipation. 01/15/21  Yes [provider]  lidocaine-prilocaine (EMLA) cream Apply 1 application topically. 1-2  Hours prior to dialysis 01/17/21  Yes [provider]  Methoxy PEG-Epoetin Beta (MIRCERA IJ) Mircera 02/12/20 02/10/21 Yes [provider]    Social History   Socioeconomic History  . Marital status: Single    Spouse name: Not on file  .  Number of children: Not on file  . Years of education: Not on file  . Highest education level: Not on file  Occupational History  . Not on file  Tobacco Use  . Smoking status: Former Research scientist (life sciences)  . Smokeless tobacco: Never Used  Vaping Use  . Vaping Use: Never used  Substance and Sexual Activity  . Alcohol use: Yes    Comment: occasional  . Drug use: Not Currently    Types: Marijuana    Comment: Last use 2020  . Sexual activity: Not on file    Comment: Tubal  Other Topics Concern  . Not on file   Social History Narrative  . Not on file   Social Determinants of Health   Financial Resource Strain: Low Risk   . Difficulty of Paying Living Expenses: Not hard at all  Food Insecurity: No Food Insecurity  . Worried About Charity fundraiser in the Last Year: Never true  . Ran Out of Food in the Last Year: Never true  Transportation Needs: No Transportation Needs  . Lack of Transportation (Medical): No  . Lack of Transportation (Non-Medical): No  Physical Activity: Inactive  . Days of Exercise per Week: 0 days  . Minutes of Exercise per Session: 0 min  Stress: No Stress Concern Present  . Feeling of Stress : Not at all  Social Connections: Moderately Integrated  . Frequency of Communication with Friends and Family: More than three times a week  . Frequency of Social Gatherings with Friends and Family: Never  . Attends Religious Services: More than 4 times per year  . Active Member of Clubs or Organizations: Yes  . Attends Archivist Meetings: More than 4 times per year  . Marital Status: Never married  Intimate Partner Violence: Not At Risk  . Fear of Current or Ex-Partner: No  . Emotionally Abused: No  . Physically Abused: No  . Sexually Abused: No    Family History  Problem Relation Age of Onset  . Breast cancer Neg Hx     ROS: [x]  Positive   [ ]  Negative   [ ]  All sytems reviewed and are negative  Cardiovascular: []  chest pain/pressure []  palpitations []  SOB lying flat []  DOE []  pain in legs while walking []  pain in legs at rest []  pain in legs at night []  non-healing ulcers []  hx of DVT [x]  swelling in arms - left  Pulmonary: []  productive cough []  asthma/wheezing []  home O2  Neurologic: []  weakness in []  arms []  legs []  numbness in []  arms []  legs []  hx of CVA []  mini stroke [] difficulty speaking or slurred speech []  temporary loss of vision in one eye []  dizziness  Hematologic: []  hx of cancer []  bleeding problems []  problems with  blood clotting easily  Endocrine:   []  diabetes []  thyroid disease  GI []  vomiting blood []  blood in stool  GU: []  CKD/renal failure []  HD--[]  M/W/F or []  T/T/S []  burning with urination []  blood in urine  Psychiatric: []  anxiety []  depression  Musculoskeletal: []  arthritis []  joint pain  Integumentary: []  rashes []  ulcers  Constitutional: []  fever []  chills   Physical Examination  Vitals:   01/27/21 0415 01/27/21 0500  BP:  137/63  Pulse:  87  Resp:  19  Temp:    SpO2: 100% 98%   Body mass index is 24.17 kg/m.  General:  NAD Gait: Not observed HENT: WNL, normocephalic Pulmonary: normal non-labored breathing Cardiac: regular, without  Murmurs,  rubs or gallops Abdomen: soft, NT/ND Vascular Exam/Pulses: Left upper arm loop graft with slight pulsatility but underlying thrill Left radial pulse palpable Extremities: without ischemic changes, without Gangrene , without cellulitis; without open wounds;  Musculoskeletal: no muscle wasting or atrophy  Neurologic: A&O X 3; Appropriate Affect ; SENSATION: normal; MOTOR FUNCTION:  moving all extremities equally. Speech is fluent/normal  CBC    Component Value Date/Time   WBC 5.0 01/26/2021 2017   RBC 3.41 (L) 01/26/2021 2017   HGB 10.2 (L) 01/26/2021 2017   HCT 33.2 (L) 01/26/2021 2017   PLT 142 (L) 01/26/2021 2017   MCV 97.4 01/26/2021 2017   MCH 29.9 01/26/2021 2017   MCHC 30.7 01/26/2021 2017   RDW 14.8 01/26/2021 2017   LYMPHSABS 1.9 01/26/2021 2017   MONOABS 0.4 01/26/2021 2017   EOSABS 0.3 01/26/2021 2017   BASOSABS 0.0 01/26/2021 2017    BMET    Component Value Date/Time   NA 139 01/26/2021 2017   K 3.7 01/26/2021 2017   K 4.3 08/16/2012 1201   CL 94 (L) 01/26/2021 2017   CO2 36 (H) 01/26/2021 2017   GLUCOSE 178 (H) 01/26/2021 2017   BUN 15 01/26/2021 2017   CREATININE 7.47 (H) 01/26/2021 2017   CALCIUM 9.3 01/26/2021 2017   GFRNONAA 5 (L) 01/26/2021 2017    COAGS: Lab Results   Component Value Date   INR 1.0 01/26/2021     Non-Invasive Vascular Imaging:     Reviewed her previous left arm fistulogram imaging and agree she had a venous anastomotic stenosis >70% with the left upper arm graft that was successfully treated   ASSESSMENT/PLAN: This is a 75 y.o. female multiple medical problems including end-stage renal disease that presents with recurrent left arm and face swelling after recent fistulogram with intervention of the left upper arm loop graft with venous anastomotic stenosis >70%.  On exam she has a slight pulsatility to her graft.  I think it would be reasonable to perform repeat fistulogram today and ultimately may require stenting of the segment.  I did not see any overt central stenosis based on the previous imaging but can certainly reevaluate this today.  I discussed with her that if her fistulogram is normal the hospitalist can pursue alternative work-ups.  I am not sure this graft stenosis has any relation to her dysphagia as discussed with her.  Please keep her n.p.o.  Covid test ordered.  She will be added onto the schedule today at the end of the day.     Marty Heck, MD [facility] Office: [place]

## 2021-01-27 NOTE — ED Notes (Signed)
Up to bedside commode with assist, good results from Dulcolax

## 2021-01-27 NOTE — ED Notes (Signed)
Amy [name] [phone], would like an update

## 2021-01-27 NOTE — ED Provider Notes (Signed)
[facility] EMERGENCY DEPARTMENT Provider Note   CSN: [id number] Arrival date & time: 01/26/21  1913     History Chief Complaint  Patient presents with  . Facial Swelling  . Arm Swelling    Amy [name] is a 75 y.o. female.  Patient presents to the emergency department with complaints of swelling of her left upper arm, left upper chest, neck and face.  Patient reports recent hospitalization for similar symptoms.  During that time they found a problem with her dialysis graft and performed an intervention which improved her symptoms.  She reports that she was better for about a week and then symptoms started reoccurring.  Patient reports that she feels a fullness in her throat when she tries to swallow but is not having difficulty breathing.        Past Medical History:  Diagnosis Date  . Chronic kidney disease    dialysis M-W-F - dialysis cath located in right side of chest  . Diabetes mellitus without complication (HCC)    type 2 - no meds   . HLD (hyperlipidemia)    diet controlled  . Hypertension    no meds    Patient Active Problem List   Diagnosis Date Noted  . Swelling of face 01/27/2021  . Facial swelling 01/27/2021  . Neck swelling 01/10/2021  . Dyslipidemia 01/09/2021  . Angio-edema 01/08/2021  . Primary osteoarthritis of right knee 12/10/2020  . Diarrhea, unspecified 07/03/2020  . Pain, unspecified 07/03/2020  . Unspecified disturbances of skin sensation 07/03/2020  . Type 2 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, with long-term current use of insulin ([place]) 05/23/2020  . Slow transit constipation 05/09/2020  . Drug-induced constipation 05/09/2020  . Acidosis 05/01/2020  . 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 Encounter for adequacy testing for peritoneal dialysis ([place]) 05/01/2020  . Liver disease, 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  . Other dietary vitamin B12 deficiency anemia 05/01/2020  .  Other disorders of electrolyte and fluid balance, not elsewhere classified 05/01/2020  . Other disorders resulting from impaired renal tubular function 05/01/2020  . Other long term (current) drug therapy 05/01/2020  . Essential hypertension 01/16/2020  . 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 ([place]) 01/16/2020  . Bilateral carpal tunnel syndrome 01/16/2020  . Abnormality of albumin 12/07/2019  . Allergy, unspecified, initial encounter 11/27/2019  . Anaphylactic shock, unspecified, initial encounter 11/27/2019  . Mild protein-calorie malnutrition ([place]) 09/25/2019  . Iron deficiency anemia, unspecified 09/22/2019  . Coagulation defect, unspecified ([place]) 09/19/2019  . Dependence on renal dialysis ([place]) 09/19/2019  . 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[place]) 09/19/2019  . Shortness of breath 09/19/2019  . Hyperkalemia 07/20/2019  . Hypocalcemia 07/20/2019  . Metabolic acidosis, increased anion gap 07/20/2019  . Nausea and vomiting 07/20/2019  . 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[place]) 04/11/2017  . Diabetic neuropathy ([place]) 04/15/2016  . Hematuria 04/15/2016  . Proteinuria due to type 2 diabetes mellitus ([place]) 04/15/2016  . 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 01/29/2016    Past Surgical History:  Procedure Laterality Date  . A/V FISTULAGRAM Left 01/10/2021   Procedure: A/V FISTULAGRAM;  Surgeon: Angelia Mould, MD;  Location: [facility];  Service: Cardiovascular;  Laterality: Left;  . AV FISTULA PLACEMENT Left 09/24/2020   Procedure: LEFT UPPER ARM ARTERIOVENOUS GORTEX  GRAFT;  Surgeon: Elam Dutch, MD;  Location: [facility];  Service: Vascular;  Laterality: Left;  . BREAST CYST EXCISION Right 2011   cyst removed -neg  .  EYE SURGERY Bilateral    cataracts  . INSERTION OF DIALYSIS CATHETER Right 07/2019  . PERIPHERAL VASCULAR BALLOON ANGIOPLASTY  01/10/2021   Procedure: PERIPHERAL VASCULAR BALLOON ANGIOPLASTY;  Surgeon: Angelia Mould, MD;  Location: [facility];  Service: Cardiovascular;;  left AV graft  . TUBAL LIGATION       OB History   No obstetric history on file.     Family History  Problem Relation Age of Onset  . Breast cancer Neg Hx     Social History   Tobacco Use  . Smoking status: Former Research scientist (life sciences)  . Smokeless tobacco: Never Used  Vaping Use  . Vaping Use: Never used  Substance Use Topics  . Alcohol use: Yes    Comment: occasional  . Drug use: Not Currently    Types: Marijuana    Comment: Last use 2020    Home Medications Prior to Admission medications   Medication Sig Start Date End Date Taking? Authorizing Provider  acetaminophen (TYLENOL) 325 MG tablet Take 650 mg by mouth every 6 (six) hours as needed for moderate pain or headache.   Yes [provider]  blood glucose meter kit and supplies KIT Dispense based on patient and insurance preference. Use up to four times daily as directed. (FOR ICD-9 250.00, 250.01). 06/10/20  Yes [name], MD  calcium acetate (PHOSLO) 667 MG capsule Take 667 mg by mouth 3 (three) times daily with meals. 01/02/20  Yes [provider]  Continuous Blood Gluc Sensor (FREESTYLE LIBRE 14 DAY SENSOR) MISC 1 Units by Does not apply route every 14 (fourteen) days. 05/23/20  Yes Nche, Charlene Brooke, NP  diclofenac Sodium (VOLTAREN) 1 % GEL Apply 2-4 g topically 4 (four) times daily as needed (knee pain).   Yes [provider]  lactulose (CHRONULAC) 10 GM/15ML solution Take 60 mLs by mouth daily as needed for mild constipation. 01/15/21  Yes [provider]  lidocaine-prilocaine (EMLA) cream Apply 1 application topically. 1-2  Hours prior to dialysis 01/17/21  Yes [provider]  Methoxy PEG-Epoetin Beta (MIRCERA IJ) Mircera 02/12/20 02/10/21 Yes [provider]    Allergies    Sensipar [cinacalcet]  Review of Systems   Review of Systems  HENT: Positive for facial swelling and trouble swallowing.   All other systems  reviewed and are negative.   Physical Exam Updated Vital Signs BP 130/64 (BP Location: Right Arm)   Pulse 90   Temp 97.8 F (36.6 C) (Oral)   Resp 16   Ht 5' 7"  (1.702 m)   Wt 70 kg   LMP  (LMP Unknown)   SpO2 100%   BMI 24.17 kg/m   Physical Exam Vitals and nursing note reviewed.  Constitutional:      General: She is not in acute distress.    Appearance: Normal appearance. She is well-developed.  HENT:     Head: Normocephalic and atraumatic.     Right Ear: Hearing normal.     Left Ear: Hearing normal.     Nose: Nose normal.  Eyes:     Conjunctiva/sclera: Conjunctivae normal.     Pupils: Pupils are equal, round, and reactive to light.  Neck:     Comments: No stridor Cardiovascular:     Rate and Rhythm: Regular rhythm.     Heart sounds: S1 normal and S2 normal. No murmur heard. No friction rub. No gallop.   Pulmonary:     Effort: Pulmonary effort is normal. No respiratory distress.  Breath sounds: Normal breath sounds.  Chest:     Chest wall: No tenderness.  Abdominal:     General: Bowel sounds are normal.     Palpations: Abdomen is soft.     Tenderness: There is no abdominal tenderness. There is no guarding or rebound. Negative signs include Murphy's sign and McBurney's sign.     Hernia: No hernia is present.  Musculoskeletal:        General: Normal range of motion.     Cervical back: Normal range of motion and neck supple. Edema (anterior, submental) present.     Comments: Graft palpable with a thrill left upper arm.  No overlying ulceration, erythema, induration or signs of infection.  Swelling of the left upper arm into the left upper torso.  Skin:    General: Skin is warm and dry.     Findings: No rash.  Neurological:     Mental Status: She is alert and oriented to person, place, and time.     GCS: GCS eye subscore is 4. GCS verbal subscore is 5. GCS motor subscore is 6.     Cranial Nerves: No cranial nerve deficit.     Sensory: No sensory deficit.      Coordination: Coordination normal.  Psychiatric:        Speech: Speech normal.        Behavior: Behavior normal.        Thought Content: Thought content normal.     ED Results / Procedures / Treatments   Labs (all labs ordered are listed, but only abnormal results are displayed) Labs Reviewed  CBC WITH DIFFERENTIAL/PLATELET - Abnormal; Notable for the following components:      Result Value   RBC 3.41 (*)    Hemoglobin 10.2 (*)    HCT 33.2 (*)    Platelets 142 (*)    All other components within normal limits  BASIC METABOLIC PANEL - Abnormal; Notable for the following components:   Chloride 94 (*)    CO2 36 (*)    Glucose, Bld 178 (*)    Creatinine, Ser 7.47 (*)    GFR, Estimated 5 (*)    All other components within normal limits  RESP PANEL BY RT-PCR (FLU A&B, COVID) ARPGX2  PROTIME-INR  APTT  TSH  CBC  CREATININE, SERUM    EKG None  Radiology No results found.  Procedures Procedures   Medications Ordered in ED Medications  heparin injection 5,000 Units (has no administration in time range)  acetaminophen (TYLENOL) tablet 650 mg (has no administration in time range)    Or  acetaminophen (TYLENOL) suppository 650 mg (has no administration in time range)  bisacodyl (DULCOLAX) suppository 10 mg (has no administration in time range)    ED Course  I have reviewed the triage vital signs and the nursing notes.  Pertinent labs & imaging results that were available during my care of the patient were reviewed by me and considered in my medical decision making (see chart for details).    MDM Rules/Calculators/A&P                          Patient presents to the emergency department with recurrent facial, neck, left arm swelling.  Patient had a similar episode recently which was possibly secondary to stenosis of the venous anastomosis of her loop graft in the left upper arm.  Symptoms improved after she had angioplasty and she remained well for 1 week until symptoms  started again.  Patient with fairly significant swelling of the face and neck but no airway compromise.  Discussed with Dr. Carlis Abbott, vascular surgery.  Recommends repeat fistulogram to see if there is recurrence of the stenosis.    When patient had symptoms previously, she did have a CT of her chest that showed a very good venous phase it did not show any symptoms of SVC syndrome.  There was no thrombus in the SVC or narrowing of the SVC.  Central veins did not appear stenosed at all.  Will admit to hospitalist for monitoring.  Final Clinical Impression(s) / ED Diagnoses Final diagnoses:  Facial swelling    Rx / DC Orders ED Discharge Orders    None       Orpah Greek, MD 01/27/21 [phone]

## 2021-01-27 NOTE — Care Plan (Signed)
This 75 years old female with known history of ESRD on hemodialysis M/W/F, who was admitted for similar complaints with face and left upper extremity swelling about 3 weeks ago,  at that time she underwent fistulogram and drug-coated angioplasty,  following which the swelling has improved but then has recurrence of swelling which is gradually getting worse.  Patient also reports difficulty swallowing but denies any fever and chills.  Vitals and labs are at baseline.  Vascular surgery Dr. Carlis Abbott was consulted who has scheduled for another fistulogram today.  Patient was seen and Patient is hemodynamically stable.  Nephrology was consulted for continuation of hemodialysis.  Patient reports left arm swelling is improving.

## 2021-01-27 NOTE — H&P (Signed)
History and Physical    Amy [name] RFX:[id number] DOB: Aug 10, 1946 DOA: 01/26/2021  PCP: [name], MD  Patient coming from: Home.  Chief Complaint: Face and left upper extremity swelling.  HPI: Amy [name] is a 75 y.o. female with known history of ESRD on hemodialysis who was admitted for similar complaints with face and left upper extremity swelling about 3 weeks ago at that time underwent fistulogram and drug-coated angioplasty following which swelling improved but started recurring the following week and has gradually worsened.  Had some difficulty swallowing but no fever or chills.  Swelling is mostly in the left upper extremity and upper chest and face and neck.  ED Course: In the ER patient appears afebrile.  Labs are at baseline Covid test is pending.  Vascular surgeon Dr. Carlis Abbott was consulted and is planning to have another fistulogram done.  Patient is hemodynamically stable at this time.  Review of Systems: As per HPI, rest all negative.   Past Medical History:  Diagnosis Date  . Chronic kidney disease    dialysis M-W-F - dialysis cath located in right side of chest  . Diabetes mellitus without complication (HCC)    type 2 - no meds   . HLD (hyperlipidemia)    diet controlled  . Hypertension    no meds    Past Surgical History:  Procedure Laterality Date  . A/V FISTULAGRAM Left 01/10/2021   Procedure: A/V FISTULAGRAM;  Surgeon: Angelia Mould, MD;  Location: [facility];  Service: Cardiovascular;  Laterality: Left;  . AV FISTULA PLACEMENT Left 09/24/2020   Procedure: LEFT UPPER ARM ARTERIOVENOUS GORTEX  GRAFT;  Surgeon: Elam Dutch, MD;  Location: [facility];  Service: Vascular;  Laterality: Left;  . BREAST CYST EXCISION Right 2011   cyst removed -neg  . EYE SURGERY Bilateral    cataracts  . INSERTION OF DIALYSIS CATHETER Right 07/2019  . PERIPHERAL VASCULAR BALLOON ANGIOPLASTY  01/10/2021   Procedure: PERIPHERAL VASCULAR BALLOON  ANGIOPLASTY;  Surgeon: Angelia Mould, MD;  Location: [facility];  Service: Cardiovascular;;  left AV graft  . TUBAL LIGATION       reports that she has quit smoking. She has never used smokeless tobacco. She reports current alcohol use. She reports previous drug use. Drug: Marijuana.  Allergies  Allergen Reactions  . Sensipar [Cinacalcet] Other (See Comments)    Other reaction(s): Unknown    Family History  Problem Relation Age of Onset  . Breast cancer Neg Hx     Prior to Admission medications   Medication Sig Start Date End Date Taking? Authorizing Provider  acetaminophen (TYLENOL) 325 MG tablet Take 650 mg by mouth every 6 (six) hours as needed for moderate pain or headache.   Yes [provider]  blood glucose meter kit and supplies KIT Dispense based on patient and insurance preference. Use up to four times daily as directed. (FOR ICD-9 250.00, 250.01). 06/10/20  Yes [name], MD  calcium acetate (PHOSLO) 667 MG capsule Take 667 mg by mouth 3 (three) times daily with meals. 01/02/20  Yes [provider]  Continuous Blood Gluc Sensor (FREESTYLE LIBRE 14 DAY SENSOR) MISC 1 Units by Does not apply route every 14 (fourteen) days. 05/23/20  Yes Nche, Charlene Brooke, NP  diclofenac Sodium (VOLTAREN) 1 % GEL Apply 2-4 g topically 4 (four) times daily as needed (knee pain).   Yes [provider]  lactulose (CHRONULAC) 10 GM/15ML solution Take 60  mLs by mouth daily as needed for mild constipation. 01/15/21  Yes [provider]  lidocaine-prilocaine (EMLA) cream Apply 1 application topically. 1-2  Hours prior to dialysis 01/17/21  Yes [provider]  Methoxy PEG-Epoetin Beta (MIRCERA IJ) Mircera 02/12/20 02/10/21 Yes [provider]    Physical Exam: Constitutional: Moderately built and nourished. Vitals:   01/27/21 0400 01/27/21 0415 01/27/21 0500 01/27/21 0544  BP: 130/63  137/63 130/64  Pulse:   87 90  Resp:  14  19 16   Temp:      TempSrc:      SpO2:  100% 98% 100%  Weight:      Height:       Eyes: Anicteric no pallor. ENMT: Swelling of the neck face and upper chest.  No crepitus. Neck: Neck swelling no crepitus.  No restriction of movement. Respiratory: No rhonchi or crepitations. Cardiovascular: S1-S2 heard. Abdomen: Soft nontender bowel sounds present. Musculoskeletal: Swelling of the left upper extremity above the fistula.  Pulses felt. Skin: No erythema or rash. Neurologic: Alert awake oriented to time place and person.  Moves all extremities. Psychiatric: Appears normal.  Normal affect.   Labs on Admission: I have personally reviewed following labs and imaging studies  CBC: Recent Labs  Lab 01/26/21 2017  WBC 5.0  NEUTROABS 2.4  HGB 10.2*  HCT 33.2*  MCV 97.4  PLT 025*   Basic Metabolic Panel: Recent Labs  Lab 01/26/21 2017  NA 139  K 3.7  CL 94*  CO2 36*  GLUCOSE 178*  BUN 15  CREATININE 7.47*  CALCIUM 9.3   GFR: Estimated Creatinine Clearance: 6.6 mL/min (A) (by C-G formula based on SCr of 7.47 mg/dL (H)). Liver Function Tests: No results for input(s): AST, ALT, ALKPHOS, BILITOT, PROT, ALBUMIN in the last 168 hours. No results for input(s): LIPASE, AMYLASE in the last 168 hours. No results for input(s): AMMONIA in the last 168 hours. Coagulation Profile: Recent Labs  Lab 01/26/21 2017  INR 1.0   Cardiac Enzymes: No results for input(s): CKTOTAL, CKMB, CKMBINDEX, TROPONINI in the last 168 hours. BNP (last 3 results) No results for input(s): PROBNP in the last 8760 hours. HbA1C: No results for input(s): HGBA1C in the last 72 hours. CBG: No results for input(s): GLUCAP in the last 168 hours. Lipid Profile: No results for input(s): CHOL, HDL, LDLCALC, TRIG, CHOLHDL, LDLDIRECT in the last 72 hours. Thyroid Function Tests: Recent Labs    01/26/21 2017  TSH 2.011   Anemia Panel: No results for input(s): VITAMINB12, FOLATE, FERRITIN, TIBC, IRON,  RETICCTPCT in the last 72 hours. Urine analysis:    Component Value Date/Time   COLORURINE YELLOW 01/18/2021 2052   APPEARANCEUR CLEAR 01/18/2021 2052   LABSPEC 1.015 01/18/2021 2052   PHURINE 8.0 01/18/2021 2052   GLUCOSEU 50 (A) 01/18/2021 2052   HGBUR NEGATIVE 01/18/2021 2052   BILIRUBINUR NEGATIVE 01/18/2021 2052   [place] NEGATIVE 01/18/2021 2052   PROTEINUR 100 (A) 01/18/2021 2052   NITRITE NEGATIVE 01/18/2021 2052   LEUKOCYTESUR NEGATIVE 01/18/2021 2052   Sepsis Labs: @LABRCNTIP (procalcitonin:4,lacticidven:4) ) Recent Results (from the past 240 hour(s))  Urine culture     Status: None   Collection Time: 01/18/21  8:52 PM   Specimen: Urine, Catheterized  Result Value Ref Range Status   Specimen Description URINE, CATHETERIZED  Final   Special Requests NONE  Final   Culture   Final    NO GROWTH Performed at [facility], [address] [place], [place] [address]  Report Status 01/20/2021 FINAL  Final     Radiological Exams on Admission: No results found.    Assessment/Plan Principal Problem:   Swelling of face Active Problems:   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HCC)   Facial swelling    1. Swelling involving the face neck upper chest and left upper extremity above the AV fistula -for which Dr. Carlis Abbott on-call vascular surgeon has been consulted.  Plan is to take patient for fistulogram.  Had similar swelling last month and underwent drug-coated angioplasty.  If the swelling does not improve with fistulogram and no defects are found then will need further assessment for other causes.  Discussed with Dr. Carlis Abbott.  We will keep patient n.p.o. 2. ESRD on hemodialysis on Monday Wednesday Friday please consult nephrology for dialysis. 3. Anemia secondary to ESRD follow CBC. 4. History of diabetes mellitus presently not on any medications.  Covid test is pending.   DVT prophylaxis: Heparin. Code Status: Full code. Family Communication: Discussed with  patient. Disposition Plan: Home. Consults called: Vascular surgeon. Admission status: Observation.   Rise Patience MD [facility] Pager [phone].  If 7PM-7AM, please contact night-coverage [email or web address] Password [facility]  01/27/2021, 6:19 AM

## 2021-01-27 NOTE — Consult Note (Signed)
Amy [name] Renal Consultation Note    Indication for Consultation:  Management of ESRD/hemodialysis, anemia, hypertension/volume, and secondary hyperparathyroidism. PCP:  HPI: Amy [name] is a 75 y.o. female  with ESRD, T2DM, HL, HTN who is being admitted for recurrent L arm/neck/facial edema.  This has been a recurrent/chronic problem for the past few months. She initially c/o L arm edema and facial swelling in Jan - Feb 2022. S/p [facility] admit 3/16-3/19/2022 for same symptoms with initial concern for angioedema. CT neck showed diffuse swelling/edema of L arm + submandibular space without airway obstruction. Symptoms had reportedly started after using her LUE AVG (which had been placed in 08/2020 but was not used until 10/2020). Vascular surgery was consulted, and she underwent shuntogram that was negative for central venous obstruction, but 70% stenosis of the venous anastomosis which was balloon angioplastied. After the procedure, she noticed some improvement. Reports that it never 100% improved however. She was back in the ED on 3/26 for same concern and then again presented 4/3 for the same.   In the ED on 4/3 - labs showed Na 139, K 3.7, BUN 15, Cr 7.4, WBC 5, Hgb 10.2, INR 1. COVID/Flu negative. TSH normal.  VVS consulted and plans to take her for another shuntogram today. She denies dyspnea or CP, does complain of mild dysphagia. No N/V/D. No fever or chills.  She dialyzes on MWF schedule at [facility]. Her last HD was Friday 4/1. She is due for dialysis today. Reports cramping with HD with > 2L UF.   Past Medical History:  Diagnosis Date  . Chronic kidney disease    dialysis M-W-F - dialysis cath located in right side of chest  . Diabetes mellitus without complication (HCC)    type 2 - no meds   . HLD (hyperlipidemia)    diet controlled  . Hypertension    no meds   Past Surgical History:  Procedure Laterality Date  . A/V FISTULAGRAM Left 01/10/2021    Procedure: A/V FISTULAGRAM;  Surgeon: Angelia Mould, MD;  Location: [facility] [facility];  Service: Cardiovascular;  Laterality: Left;  . AV FISTULA PLACEMENT Left 09/24/2020   Procedure: LEFT UPPER ARM ARTERIOVENOUS GORTEX  GRAFT;  Surgeon: Elam Dutch, MD;  Location: [place];  Service: Vascular;  Laterality: Left;  . BREAST CYST EXCISION Right 2011   cyst removed -neg  . EYE SURGERY Bilateral    cataracts  . INSERTION OF DIALYSIS CATHETER Right 07/2019  . PERIPHERAL VASCULAR BALLOON ANGIOPLASTY  01/10/2021   Procedure: PERIPHERAL VASCULAR BALLOON ANGIOPLASTY;  Surgeon: Angelia Mould, MD;  Location: [place] [facility];  Service: Cardiovascular;;  left AV graft  . TUBAL LIGATION     Family History  Problem Relation Age of Onset  . Breast cancer Neg Hx    Social History:  reports that she has quit smoking. She has never used smokeless tobacco. She reports current alcohol use. She reports previous drug use. Drug: Marijuana.  ROS: As per HPI otherwise negative.  Physical Exam: Vitals:   01/27/21 0544 01/27/21 0931 01/27/21 1100 01/27/21 1322  BP: 130/64 127/61 114/64 115/69  Pulse: 90 94 89 95  Resp: _0 Temp:    97.6 F (36.4 C)  TempSrc:    Oral  SpO2: 100% 100% 100% 100%  Weight:      Height:         General: Well developed, well nourished, in no acute distress.  Neck: Supple  without lymphadenopathy/masses. Mild edema of neck without tenderness. Lungs: Clear bilaterally to auscultation without wheezes, rales, or rhonchi. Breathing is unlabored. Heart: RRR with normal S1, S2. No murmurs, rubs, or gallops appreciated. Abdomen: Soft, non-tender, non-distended with normoactive bowel sounds.  Musculoskeletal:  Strength and tone appear normal for age. Lower extremities: No edema or ischemic changes, no open wounds. Upper extremities: LUE with 2+ pitting edema, RUE with trace elbow edema Neuro: Alert and oriented X 3. Moves all extremities  spontaneously. Psych:  Responds to questions appropriately with a normal affect. Dialysis Access: LUE AVG + bruit  Allergies  Allergen Reactions  . Sensipar [Cinacalcet] Other (See Comments)    Other reaction(s): Unknown   Prior to Admission medications   Medication Sig Start Date End Date Taking? Authorizing Provider  acetaminophen (TYLENOL) 325 MG tablet Take 650 mg by mouth every 6 (six) hours as needed for moderate pain or headache.   Yes [provider]  blood glucose meter kit and supplies KIT Dispense based on patient and insurance preference. Use up to four times daily as directed. (FOR ICD-9 250.00, 250.01). 06/10/20  Yes [name], MD  calcium acetate (PHOSLO) 667 MG capsule Take 667 mg by mouth 3 (three) times daily with meals. 01/02/20  Yes [provider]  Continuous Blood Gluc Sensor (FREESTYLE LIBRE 14 DAY SENSOR) MISC 1 Units by Does not apply route every 14 (fourteen) days. 05/23/20  Yes Nche, Charlene Brooke, NP  diclofenac Sodium (VOLTAREN) 1 % GEL Apply 2-4 g topically 4 (four) times daily as needed (knee pain).   Yes [provider]  lactulose (CHRONULAC) 10 GM/15ML solution Take 60 mLs by mouth daily as needed for mild constipation. 01/15/21  Yes [provider]  lidocaine-prilocaine (EMLA) cream Apply 1 application topically. 1-2  Hours prior to dialysis 01/17/21  Yes [provider]  Methoxy PEG-Epoetin Beta (MIRCERA IJ) Mircera 02/12/20 02/10/21 Yes [provider]   Current Facility-Administered Medications  Medication Dose Route Frequency Provider Last Rate Last Admin  . acetaminophen (TYLENOL) tablet 650 mg  650 mg Oral Q6H PRN Rise Patience, MD       Or  . acetaminophen (TYLENOL) suppository 650 mg  650 mg Rectal Q6H PRN Rise Patience, MD      . heparin injection 5,000 Units  5,000 Units Subcutaneous Q8H Rise Patience, MD   5,000 Units at 01/27/21 1358   Current Outpatient Medications   Medication Sig Dispense Refill  . acetaminophen (TYLENOL) 325 MG tablet Take 650 mg by mouth every 6 (six) hours as needed for moderate pain or headache.    . blood glucose meter kit and supplies KIT Dispense based on patient and insurance preference. Use up to four times daily as directed. (FOR ICD-9 250.00, 250.01). 1 each 0  . calcium acetate (PHOSLO) 667 MG capsule Take 667 mg by mouth 3 (three) times daily with meals.    . Continuous Blood Gluc Sensor (FREESTYLE LIBRE 14 DAY SENSOR) MISC 1 Units by Does not apply route every 14 (fourteen) days. 2 each 0  . diclofenac Sodium (VOLTAREN) 1 % GEL Apply 2-4 g topically 4 (four) times daily as needed (knee pain).    [name] lactulose (CHRONULAC) 10 GM/15ML solution Take 60 mLs by mouth daily as needed for mild constipation.    . lidocaine-prilocaine (EMLA) cream Apply 1 application topically. 1-2  Hours prior to dialysis    . Methoxy PEG-Epoetin Beta (MIRCERA IJ) Mircera     Labs: Basic  Metabolic Panel: Recent Labs  Lab 01/26/21 2017 01/27/21 0755  NA 139  --   K 3.7  --   CL 94*  --   CO2 36*  --   GLUCOSE 178*  --   BUN 15  --   CREATININE 7.47* 8.04*  CALCIUM 9.3  --    CBC: Recent Labs  Lab 01/26/21 2017 01/27/21 0755  WBC 5.0 6.0  NEUTROABS 2.4  --   HGB 10.2* 10.1*  HCT 33.2* 33.2*  MCV 97.4 97.6  PLT 142* 123*   Dialysis Orders:  MWF at [name]:45hr, 400/500, EDW 70.5kg, 2K/2Ca, UFP #2, AVG, heparin 7000 unit bolus - Hectoral 81mg IV q HD - Mircera 534m IV q 4 weeks (last 12/30/20) - Parsabiv 2.61m61mV q 2 weeks (just ordered, not yet given)  Assessment/Plan: 1.  L arm/facial/neck edema: Chronic/recurrent issue, s/p shuntogram 3/16 with 70% outflow stenosis, treated with balloon angioplasty. VVS consulted, for repeat shuntogram today. 2.  ESRD: Usual MWF schedule. Will wait until after planned procedure, plan for HD early tomorrow.  3.  Hypertension/volume: BP controlled, will try to lower EDW to see if overall volume  reduction will help this. 4.  Anemia of ESRD: Hgb 10.1 - will give dose Aranesp tomorrow. 5.  Metabolic bone disease: Ca ok, Phos pending. On Phoslo 1/meals. 6.  T2DM  KatVeneta PentonA-C 01/27/2021, 2:09 PM  [employer]

## 2021-01-27 NOTE — ED Notes (Signed)
Help patient back in the back off the bedside toilet patient is on the monitor with call bell in reach

## 2021-01-27 NOTE — Interval H&P Note (Signed)
History and Physical Interval Note:  01/27/2021 2:08 PM  Amy [name]  has presented today for surgery, with the diagnosis of instage renal.  The various methods of treatment have been discussed with the patient and family. After consideration of risks, benefits and other options for treatment, the patient has consented to  Procedure(s): A/V FISTULAGRAM (N/A) as a surgical intervention.  The patient's history has been reviewed, patient examined, no change in status, stable for surgery.  I have reviewed the patient's chart and labs.  Questions were answered to the patient's satisfaction.     Servando Snare

## 2021-01-27 NOTE — Op Note (Signed)
    Patient name: Amy [name] MRN: [id number] DOB: [birth year] Sex: female  01/27/2021 Pre-operative Diagnosis: End-stage renal disease, facial swelling Post-operative diagnosis:  Same Surgeon:  Eda Paschal. Donzetta Matters, MD Procedure Performed: 1.  Ultrasound-guided cannulation left upper arm AV graft 2.  Left upper extremity fistulogram  Indications: 75 year old female on dialysis via left upper arm AV graft.  She has history of facial swelling and hoarseness.  She has undergone balloon angioplasty of the outflow with drug-coated balloon now has recurrent symptoms and is indicated for repeat fistulogram.  Findings: The graft is patent.  The central veins are patent.  There is no indication for intervention.   Procedure:  The patient was identified in the holding area and taken to room 8.  The patient was then placed supine on the table and prepped and draped in the usual sterile fashion.  A time out was called.  Ultrasound was used to evaluate the left arm AV graft.  There is anesthetized 1% lidocaine.  The graft was then cannulated with micropuncture needle followed by wire and sheath under direct ultrasound visualization.  And images saved the permanent record.  Left upper extremity fistulogram including central venography was performed.  No intervention was undertaken.  The area was suture-ligated with 4-0 Monocryl and the sheath removed.  She tolerated procedure without any complication.  Contrast: 24cc   Jonel Weldon C. Donzetta Matters, MD [facility] Office: [phone] Pager: [phone]

## 2021-01-27 NOTE — Consult Note (Signed)
Hospital Consult    Reason for Consult: Left arm and face swelling Referring Physician: ED MRN #:  [id number]  History of Present Illness: This is a 75 y.o. female with history of end-stage renal disease, hypertension, hyperlipidemia, diabetes that vascular surgery has been consulted for recurrent left arm and face swelling.  She was initially seen by my partner Dr. Donzetta Matters on 01/09/2021.  She had gradual onset of face and arm swelling over several weeks.  She underwent left arm fistulogram on 01/10/2021.  She had about a 70% stenosis at the venous anastomosis in the upper arm loop graft.  She states that her arm swelling and her face swelling got better for about a week.  She is now started having recurrent symptoms that prompted her visit to the ED.  She also complains of dysphagia.  Past Medical History:  Diagnosis Date  . Chronic kidney disease    dialysis M-W-F - dialysis cath located in right side of chest  . Diabetes mellitus without complication (HCC)    type 2 - no meds   . HLD (hyperlipidemia)    diet controlled  . Hypertension    no meds    Past Surgical History:  Procedure Laterality Date  . A/V FISTULAGRAM Left 01/10/2021   Procedure: A/V FISTULAGRAM;  Surgeon: Angelia Mould, MD;  Location: [facility];  Service: Cardiovascular;  Laterality: Left;  . AV FISTULA PLACEMENT Left 09/24/2020   Procedure: LEFT UPPER ARM ARTERIOVENOUS GORTEX  GRAFT;  Surgeon: Elam Dutch, MD;  Location: [facility];  Service: Vascular;  Laterality: Left;  . BREAST CYST EXCISION Right 2011   cyst removed -neg  . EYE SURGERY Bilateral    cataracts  . INSERTION OF DIALYSIS CATHETER Right 07/2019  . PERIPHERAL VASCULAR BALLOON ANGIOPLASTY  01/10/2021   Procedure: PERIPHERAL VASCULAR BALLOON ANGIOPLASTY;  Surgeon: Angelia Mould, MD;  Location: [facility];  Service: Cardiovascular;;  left AV graft  . TUBAL LIGATION      Allergies  Allergen Reactions  . Sensipar  [Cinacalcet] Other (See Comments)    Other reaction(s): Unknown    Prior to Admission medications   Medication Sig Start Date End Date Taking? Authorizing Provider  acetaminophen (TYLENOL) 325 MG tablet Take 650 mg by mouth every 6 (six) hours as needed for moderate pain or headache.   Yes [provider]  blood glucose meter kit and supplies KIT Dispense based on patient and insurance preference. Use up to four times daily as directed. (FOR ICD-9 250.00, 250.01). 06/10/20  Yes [name], MD  calcium acetate (PHOSLO) 667 MG capsule Take 667 mg by mouth 3 (three) times daily with meals. 01/02/20  Yes [provider]  Continuous Blood Gluc Sensor (FREESTYLE LIBRE 14 DAY SENSOR) MISC 1 Units by Does not apply route every 14 (fourteen) days. 05/23/20  Yes Nche, Charlene Brooke, NP  diclofenac Sodium (VOLTAREN) 1 % GEL Apply 2-4 g topically 4 (four) times daily as needed (knee pain).   Yes [provider]  lactulose (CHRONULAC) 10 GM/15ML solution Take 60 mLs by mouth daily as needed for mild constipation. 01/15/21  Yes [provider]  lidocaine-prilocaine (EMLA) cream Apply 1 application topically. 1-2  Hours prior to dialysis 01/17/21  Yes [provider]  Methoxy PEG-Epoetin Beta (MIRCERA IJ) Mircera 02/12/20 02/10/21 Yes [provider]    Social History   Socioeconomic History  . Marital status: Single    Spouse name: Not on file  .  Number of children: Not on file  . Years of education: Not on file  . Highest education level: Not on file  Occupational History  . Not on file  Tobacco Use  . Smoking status: Former Research scientist (life sciences)  . Smokeless tobacco: Never Used  Vaping Use  . Vaping Use: Never used  Substance and Sexual Activity  . Alcohol use: Yes    Comment: occasional  . Drug use: Not Currently    Types: Marijuana    Comment: Last use 2020  . Sexual activity: Not on file    Comment: Tubal  Other Topics Concern  . Not on file   Social History Narrative  . Not on file   Social Determinants of Health   Financial Resource Strain: Low Risk   . Difficulty of Paying Living Expenses: Not hard at all  Food Insecurity: No Food Insecurity  . Worried About Charity fundraiser in the Last Year: Never true  . Ran Out of Food in the Last Year: Never true  Transportation Needs: No Transportation Needs  . Lack of Transportation (Medical): No  . Lack of Transportation (Non-Medical): No  Physical Activity: Inactive  . Days of Exercise per Week: 0 days  . Minutes of Exercise per Session: 0 min  Stress: No Stress Concern Present  . Feeling of Stress : Not at all  Social Connections: Moderately Integrated  . Frequency of Communication with Friends and Family: More than three times a week  . Frequency of Social Gatherings with Friends and Family: Never  . Attends Religious Services: More than 4 times per year  . Active Member of Clubs or Organizations: Yes  . Attends Archivist Meetings: More than 4 times per year  . Marital Status: Never married  Intimate Partner Violence: Not At Risk  . Fear of Current or Ex-Partner: No  . Emotionally Abused: No  . Physically Abused: No  . Sexually Abused: No    Family History  Problem Relation Age of Onset  . Breast cancer Neg Hx     ROS: [x]  Positive   [ ]  Negative   [ ]  All sytems reviewed and are negative  Cardiovascular: []  chest pain/pressure []  palpitations []  SOB lying flat []  DOE []  pain in legs while walking []  pain in legs at rest []  pain in legs at night []  non-healing ulcers []  hx of DVT [x]  swelling in arms - left  Pulmonary: []  productive cough []  asthma/wheezing []  home O2  Neurologic: []  weakness in []  arms []  legs []  numbness in []  arms []  legs []  hx of CVA []  mini stroke [] difficulty speaking or slurred speech []  temporary loss of vision in one eye []  dizziness  Hematologic: []  hx of cancer []  bleeding problems []  problems with  blood clotting easily  Endocrine:   []  diabetes []  thyroid disease  GI []  vomiting blood []  blood in stool  GU: []  CKD/renal failure []  HD--[]  M/W/F or []  T/T/S []  burning with urination []  blood in urine  Psychiatric: []  anxiety []  depression  Musculoskeletal: []  arthritis []  joint pain  Integumentary: []  rashes []  ulcers  Constitutional: []  fever []  chills   Physical Examination  Vitals:   01/27/21 0415 01/27/21 0500  BP:  137/63  Pulse:  87  Resp:  19  Temp:    SpO2: 100% 98%   Body mass index is 24.17 kg/m.  General:  NAD Gait: Not observed HENT: WNL, normocephalic Pulmonary: normal non-labored breathing Cardiac: regular, without  Murmurs,  rubs or gallops Abdomen: soft, NT/ND Vascular Exam/Pulses: Left upper arm loop graft with slight pulsatility but underlying thrill Left radial pulse palpable Extremities: without ischemic changes, without Gangrene , without cellulitis; without open wounds;  Musculoskeletal: no muscle wasting or atrophy  Neurologic: A&O X 3; Appropriate Affect ; SENSATION: normal; MOTOR FUNCTION:  moving all extremities equally. Speech is fluent/normal  CBC    Component Value Date/Time   WBC 5.0 01/26/2021 2017   RBC 3.41 (L) 01/26/2021 2017   HGB 10.2 (L) 01/26/2021 2017   HCT 33.2 (L) 01/26/2021 2017   PLT 142 (L) 01/26/2021 2017   MCV 97.4 01/26/2021 2017   MCH 29.9 01/26/2021 2017   MCHC 30.7 01/26/2021 2017   RDW 14.8 01/26/2021 2017   LYMPHSABS 1.9 01/26/2021 2017   MONOABS 0.4 01/26/2021 2017   EOSABS 0.3 01/26/2021 2017   BASOSABS 0.0 01/26/2021 2017    BMET    Component Value Date/Time   NA 139 01/26/2021 2017   K 3.7 01/26/2021 2017   K 4.3 08/16/2012 1201   CL 94 (L) 01/26/2021 2017   CO2 36 (H) 01/26/2021 2017   GLUCOSE 178 (H) 01/26/2021 2017   BUN 15 01/26/2021 2017   CREATININE 7.47 (H) 01/26/2021 2017   CALCIUM 9.3 01/26/2021 2017   GFRNONAA 5 (L) 01/26/2021 2017    COAGS: Lab Results   Component Value Date   INR 1.0 01/26/2021     Non-Invasive Vascular Imaging:     Reviewed her previous left arm fistulogram imaging and agree she had a venous anastomotic stenosis >70% with the left upper arm graft that was successfully treated   ASSESSMENT/PLAN: This is a 75 y.o. female multiple medical problems including end-stage renal disease that presents with recurrent left arm and face swelling after recent fistulogram with intervention of the left upper arm loop graft with venous anastomotic stenosis >70%.  On exam she has a slight pulsatility to her graft.  I think it would be reasonable to perform repeat fistulogram today and ultimately may require stenting of the segment.  I did not see any overt central stenosis based on the previous imaging but can certainly reevaluate this today.  I discussed with her that if her fistulogram is normal the hospitalist can pursue alternative work-ups.  I am not sure this graft stenosis has any relation to her dysphagia as discussed with her.  Please keep her n.p.o.  Covid test ordered.  She will be added onto the schedule today at the end of the day.     Marty Heck, MD [facility] Office: [place]

## 2021-01-28 ENCOUNTER — Encounter (HOSPITAL_COMMUNITY): Payer: Self-pay | Admitting: Vascular Surgery

## 2021-01-28 ENCOUNTER — Observation Stay (HOSPITAL_COMMUNITY): Payer: Medicare Other

## 2021-01-28 DIAGNOSIS — R220 Localized swelling, mass and lump, head: Principal | ICD-10-CM

## 2021-01-28 LAB — GLUCOSE, CAPILLARY
Glucose-Capillary: 105 mg/dL — ABNORMAL HIGH (ref 70–99)
Glucose-Capillary: 120 mg/dL — ABNORMAL HIGH (ref 70–99)
Glucose-Capillary: 125 mg/dL — ABNORMAL HIGH (ref 70–99)
Glucose-Capillary: 191 mg/dL — ABNORMAL HIGH (ref 70–99)
Glucose-Capillary: 200 mg/dL — ABNORMAL HIGH (ref 70–99)
Glucose-Capillary: 67 mg/dL — ABNORMAL LOW (ref 70–99)

## 2021-01-28 LAB — BASIC METABOLIC PANEL
Anion gap: 11 (ref 5–15)
BUN: 21 mg/dL (ref 8–23)
CO2: 30 mmol/L (ref 22–32)
Calcium: 9.2 mg/dL (ref 8.9–10.3)
Chloride: 97 mmol/L — ABNORMAL LOW (ref 98–111)
Creatinine, Ser: 9.21 mg/dL — ABNORMAL HIGH (ref 0.44–1.00)
GFR, Estimated: 4 mL/min — ABNORMAL LOW (ref >60)
Glucose, Bld: 110 mg/dL — ABNORMAL HIGH (ref 70–99)
Potassium: 3.5 mmol/L (ref 3.5–5.1)
Sodium: 138 mmol/L (ref 135–145)

## 2021-01-28 LAB — CBC
HCT: 29.8 % — ABNORMAL LOW (ref 36.0–46.0)
Hemoglobin: 9.4 g/dL — ABNORMAL LOW (ref 12.0–15.0)
MCH: 30.5 pg (ref 26.0–34.0)
MCHC: 31.5 g/dL (ref 30.0–36.0)
MCV: 96.8 fL (ref 80.0–100.0)
Platelets: 137 10*3/uL — ABNORMAL LOW (ref 150–400)
RBC: 3.08 MIL/uL — ABNORMAL LOW (ref 3.87–5.11)
RDW: 14.6 % (ref 11.5–15.5)
WBC: 4.7 10*3/uL (ref 4.0–10.5)
nRBC: 0 % (ref 0.0–0.2)

## 2021-01-28 LAB — PHOSPHORUS: Phosphorus: 3.8 mg/dL (ref 2.5–4.6)

## 2021-01-28 LAB — MAGNESIUM: Magnesium: 2.4 mg/dL (ref 1.7–2.4)

## 2021-01-28 MED ORDER — HEPARIN SODIUM (PORCINE) 1000 UNIT/ML DIALYSIS: 1000.0000 [IU] | INTRAMUSCULAR | Status: DC | PRN

## 2021-01-28 MED ORDER — LIDOCAINE-PRILOCAINE 2.5-2.5 % EX CREA: 1.0000 "application " | TOPICAL_CREAM | CUTANEOUS | Status: DC | PRN

## 2021-01-28 MED ORDER — SODIUM CHLORIDE 0.9 % IV SOLN: 100.0000 mL | INTRAVENOUS | Status: DC | PRN

## 2021-01-28 MED ORDER — DARBEPOETIN ALFA 60 MCG/0.3ML IJ SOSY
60.0000 ug | PREFILLED_SYRINGE | Freq: Once | INTRAMUSCULAR | Status: AC
  Administered 2021-01-28: 60 ug via INTRAVENOUS

## 2021-01-28 MED ORDER — ALTEPLASE 2 MG IJ SOLR: 2.0000 mg | Freq: Once | INTRAMUSCULAR | Status: DC | PRN

## 2021-01-28 MED ORDER — LIDOCAINE HCL (PF) 1 % IJ SOLN: 5.0000 mL | INTRAMUSCULAR | Status: DC | PRN

## 2021-01-28 MED ORDER — PENTAFLUOROPROP-TETRAFLUOROETH EX AERO: 1.0000 "application " | INHALATION_SPRAY | CUTANEOUS | Status: DC | PRN

## 2021-01-28 MED ORDER — DARBEPOETIN ALFA 60 MCG/0.3ML IJ SOSY
PREFILLED_SYRINGE | INTRAMUSCULAR | Status: AC
  Filled 2021-01-28: qty 0.3

## 2021-01-28 MED ORDER — HEPARIN SODIUM (PORCINE) 1000 UNIT/ML IJ SOLN
INTRAMUSCULAR | Status: AC
  Filled 2021-01-28: qty 7

## 2021-01-28 MED ORDER — HEPARIN SODIUM (PORCINE) 1000 UNIT/ML DIALYSIS
7000.0000 [IU] | INTRAMUSCULAR | Status: DC | PRN
  Administered 2021-01-28: 7000 [IU] via INTRAVENOUS_CENTRAL
  Filled 2021-01-28 (×2): qty 7

## 2021-01-28 MED ORDER — CALCIUM ACETATE (PHOS BINDER) 667 MG PO CAPS
667.0000 mg | ORAL_CAPSULE | Freq: Three times a day (TID) | ORAL | Status: DC
  Administered 2021-01-28 – 2021-01-29 (×3): 667 mg via ORAL
  Filled 2021-01-28 (×3): qty 1

## 2021-01-28 NOTE — Care Management Obs Status (Signed)
[employer] NOTIFICATION   Patient Details  Name: ILISHA VIELMA MRN: [id number] Date of Birth: [birth year]   Medicare Observation Status Notification Given:  Yes    Bartholomew Crews, RN 01/28/2021, 12:59 PM

## 2021-01-28 NOTE — TOC Initial Note (Signed)
Transition of Care [facility]) - Initial/Assessment Note    Patient Details  Name: Amy [name] MRN: [id number] Date of Birth: [birth year]  Transition of Care [facility]) CM/SW Contact:    Bartholomew Crews, RN Phone Number: [phone] 01/28/2021, 1:05 PM  Clinical Narrative:                  Spoke with patient at the bedside. PTA home with son. Attends dialysis at [facility] on MWF - son provides transportation. Patient stated that other than being blind, she has no needs. No DME. Stated that she has transportation to get home. TOC following for transition needs.   Expected Discharge Plan: Home/Self Care Barriers to Discharge: Continued Medical Work up   Patient Goals and CMS Choice Patient states their goals for this hospitalization and ongoing recovery are:: home with son CMS Medicare.gov Compare Post Acute Care list provided to:: Patient Choice offered to / list presented to : NA  Expected Discharge Plan and Services Expected Discharge Plan: Home/Self Care In-house Referral: NA Discharge Planning Services: CM Consult Post Acute Care Choice: NA Living arrangements for the past 2 months: Single Family Home                 DME Arranged: N/A DME Agency: NA       HH Arranged: NA HH Agency: NA        Prior Living Arrangements/Services Living arrangements for the past 2 months: Single Family Home Lives with:: Self,Adult Children Patient language and need for interpreter reviewed:: Yes Do you feel safe going back to the place where you live?: Yes      Need for Family Participation in Patient Care: Yes (Comment) Care giver support system in place?: Yes (comment)   Criminal Activity/Legal Involvement Pertinent to Current Situation/Hospitalization: No - Comment as needed  Activities of Daily Living Home Assistive Devices/Equipment: None ADL Screening (condition at time of admission) Patient's cognitive ability adequate to safely complete daily activities?: Yes Is the patient deaf or  have difficulty hearing?: No Does the patient have difficulty seeing, even when wearing glasses/contacts?: No Does the patient have difficulty concentrating, remembering, or making decisions?: No Patient able to express need for assistance with ADLs?: Yes Does the patient have difficulty dressing or bathing?: No Independently performs ADLs?: Yes (appropriate for developmental age) Does the patient have difficulty walking or climbing stairs?: Yes Weakness of Legs: Both Weakness of Arms/Hands: None  Permission Sought/Granted                  Emotional Assessment Appearance:: Appears stated age Attitude/Demeanor/Rapport: Engaged Affect (typically observed): Accepting Orientation: : Oriented to Self,Oriented to  Time,Oriented to Place,Oriented to Situation Alcohol / Substance Use: Not Applicable Psych Involvement: No (comment)  Admission diagnosis:  Facial swelling [R22.0] Swelling of face [R22.0] Patient Active Problem List   Diagnosis Date Noted  . Swelling of face 01/27/2021  . Facial swelling 01/27/2021  . Neck swelling 01/10/2021  . Dyslipidemia 01/09/2021  . Angio-edema 01/08/2021  . Primary osteoarthritis of right knee 12/10/2020  . Diarrhea, unspecified 07/03/2020  . Pain, unspecified 07/03/2020  . Unspecified disturbances of skin sensation 07/03/2020  . Type 2 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, with long-term current use of insulin ([place]) 05/23/2020  . Slow transit constipation 05/09/2020  . Drug-induced constipation 05/09/2020  . Acidosis 05/01/2020  . 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 Encounter for adequacy testing for peritoneal dialysis ([place]) 05/01/2020  . Liver disease, 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  .  Other dietary vitamin B12 deficiency anemia 05/01/2020  . Other disorders of electrolyte and fluid balance, not elsewhere classified 05/01/2020  . Other disorders resulting from impaired renal tubular function  05/01/2020  . Other long term (current) drug therapy 05/01/2020  . Essential hypertension 01/16/2020  . 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 ([place]) 01/16/2020  . Bilateral carpal tunnel syndrome 01/16/2020  . Abnormality of albumin 12/07/2019  . Allergy, unspecified, initial encounter 11/27/2019  . Anaphylactic shock, unspecified, initial encounter 11/27/2019  . Mild protein-calorie malnutrition ([place]) 09/25/2019  . Iron deficiency anemia, unspecified 09/22/2019  . Coagulation defect, unspecified ([place]) 09/19/2019  . Dependence on renal dialysis ([place]) 09/19/2019  . 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[place]) 09/19/2019  . Shortness of breath 09/19/2019  . Hyperkalemia 07/20/2019  . Hypocalcemia 07/20/2019  . Metabolic acidosis, increased anion gap 07/20/2019  . Nausea and vomiting 07/20/2019  . 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[place]) 04/11/2017  . Diabetic neuropathy ([place]) 04/15/2016  . Hematuria 04/15/2016  . Proteinuria due to type 2 diabetes mellitus ([place]) 04/15/2016  . 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 01/29/2016   PCP:  [name], MD Pharmacy:   [facility] [phone] [address], [address] AT [facility] [address] [place] [address] Phone: [phone] Fax: [phone]     Social Determinants of Health ([place]) Interventions    Readmission Risk Interventions No flowsheet data found.

## 2021-01-28 NOTE — Plan of Care (Signed)
  Problem: Fluid Volume: Goal: Compliance with measures to maintain balanced fluid volume will improve Outcome: Completed/Met   Problem: Nutritional: Goal: Ability to make healthy dietary choices will improve Outcome: Completed/Met   Problem: Education: Goal: Knowledge of General Education information will improve Description: Including pain rating scale, medication(s)/side effects and non-pharmacologic comfort measures Outcome: Completed/Met   Problem: Health Behavior/Discharge Planning: Goal: Ability to manage health-related needs will improve Outcome: Completed/Met   Problem: Clinical Measurements: Goal: Ability to maintain clinical measurements within normal limits will improve Outcome: Completed/Met Goal: Will remain free from infection Outcome: Completed/Met Goal: Diagnostic test results will improve Outcome: Completed/Met   Problem: Nutrition: Goal: Adequate nutrition will be maintained Outcome: Completed/Met   Problem: Coping: Goal: Level of anxiety will decrease Outcome: Completed/Met   Problem: Elimination: Goal: Will not experience complications related to bowel motility Outcome: Completed/Met Goal: Will not experience complications related to urinary retention Outcome: Completed/Met   Problem: Safety: Goal: Ability to remain free from injury will improve Outcome: Completed/Met   Problem: Skin Integrity: Goal: Risk for impaired skin integrity will decrease Outcome: Completed/Met

## 2021-01-28 NOTE — Progress Notes (Signed)
Hemodialysis- Initiated via L AVG using 16g needles x2. Patient had pain with first venous cannulation, flushed well however was too painful, needle pulled and cannulated again. Patient currently denies pain. Labs and orders reviewed. Continue to monitor.

## 2021-01-28 NOTE — TOC Progression Note (Signed)
Transition of Care [facility]) - Progression Note    Patient Details  Name: Amy [name] MRN: [id number] Date of Birth: 16-Aug-1946  Transition [facility] CM/SW Contact  Bartholomew Crews, RN Phone Number: [phone] 01/28/2021, 2:01 PM  Clinical Narrative:     Spoke with patient at the bedside along with renal navigator to discuss transition planning. Patient called her son on speaker phone. Son is able to pick up patient at 9am tomorrow for discharge and will get patient to her outpatient dialysis center. Attending notified and agreeable with plan at this time. TOC following for transition needs.   Expected Discharge Plan: Home/Self Care Barriers to Discharge: Continued Medical Work up  Expected Discharge Plan and Services Expected Discharge Plan: Home/Self Care In-house Referral: NA Discharge Planning Services: CM Consult Post Acute Care Choice: NA Living arrangements for the past 2 months: Single Family Home                 DME Arranged: N/A DME Agency: NA       HH Arranged: NA HH Agency: NA         Social Determinants of Health (SDOH) Interventions    Readmission Risk Interventions No flowsheet data found.

## 2021-01-28 NOTE — Progress Notes (Signed)
[facility] Progress Note   Subjective:   Seen on HD - 2L UFG and  tolerating. No CP or dyspnea. Appreciate VVS assistance, s/p shuntogram yesterday with patent AVG and central veins - no stenotic areas.   Objective Vitals:   01/28/21 0432 01/28/21 0720 01/28/21 0724 01/28/21 0730  BP: 119/62 (!) 144/70 129/71 133/65  Pulse: 99     Resp: 18  (!) 22 (!) 21  Temp: 97.8 F (36.6 C) 98 F (36.7 C)    TempSrc: Oral Oral    SpO2: 92% 93%    Weight:  71 kg    Height:       Physical Exam General: Well appearing woman, NAD Heart: RRR; no murmur Lungs: CTA anteriorly Abdomen: soft, non-tender Extremities: No LE edema, 1+ LUE edema Dialysis Access: LUE AVG + bruit  Additional Objective Labs: Basic Metabolic Panel: Recent Labs  Lab 01/26/21 2017 01/27/21 0755 01/28/21 0220  NA 139  --  138  K 3.7  --  3.5  CL 94*  --  97*  CO2 36*  --  30  GLUCOSE 178*  --  110*  BUN 15  --  21  CREATININE 7.47* 8.04* 9.21*  CALCIUM 9.3  --  9.2  PHOS  --   --  3.8   CBC: Recent Labs  Lab 01/26/21 2017 01/27/21 0755 01/28/21 0220  WBC 5.0 6.0 4.7  NEUTROABS 2.4  --   --   HGB 10.2* 10.1* 9.4*  HCT 33.2* 33.2* 29.8*  MCV 97.4 97.6 96.8  PLT 142* 123* 137*   Studies/Results: PERIPHERAL VASCULAR CATHETERIZATION  Result Date: 01/27/2021 Patient name: Amy [name] MRN: [id number] DOB: Feb 25, 1946 Sex: female 01/27/2021 Pre-operative Diagnosis: End-stage renal disease, facial swelling Post-operative diagnosis:  Same Surgeon:  Eda Paschal. Donzetta Matters, MD Procedure Performed: 1.  Ultrasound-guided cannulation left upper arm AV graft 2.  Left upper extremity fistulogram Indications: 75 year old female on dialysis via left upper arm AV graft.  She has history of facial swelling and hoarseness.  She has undergone balloon angioplasty of the outflow with drug-coated balloon now has recurrent symptoms and is indicated for repeat fistulogram. Findings: The graft is patent.  The central veins  are patent.  There is no indication for intervention.  Procedure:  The patient was identified in the holding area and taken to room 8.  The patient was then placed supine on the table and prepped and draped in the usual sterile fashion.  A time out was called.  Ultrasound was used to evaluate the left arm AV graft.  There is anesthetized 1% lidocaine.  The graft was then cannulated with micropuncture needle followed by wire and sheath under direct ultrasound visualization.  And images saved the permanent record.  Left upper extremity fistulogram including central venography was performed.  No intervention was undertaken.  The area was suture-ligated with 4-0 Monocryl and the sheath removed.  She tolerated procedure without any complication. Contrast: 24cc Brandon C. Donzetta Matters, MD [facility] [place] Office: [phone] Pager: [phone]   Medications: . sodium chloride    . sodium chloride     . heparin sodium (porcine)      . heparin  5,000 Units Subcutaneous Q8H   Dialysis Orders: MWF at [facility] 3:45hr, 400/500, EDW 70.5kg, 2K/2Ca, UFP #2, AVG, heparin 7000 unit bolus - Hectoral 20mg IV q HD - Mircera 578m IV q 4 weeks (last 12/30/20) - Parsabiv 2.'5mg'$  IV q 2 weeks (just ordered, not yet given)  Assessment/Plan:  1.  L arm/facial/neck edema: Chronic/recurrent issue, s/p shuntogram 3/16 with 70% outflow stenosis, treated with balloon angioplasty with some improvement. Now s/p another shuntogram 4/4 which was completely normal/patent. Discussed with patient, will try to get overall volume/weight down a little to see if helps. Would consider re-imaging the neck/soft tissue area. 2.  ESRD: Usual MWF schedule - HD today as rollover from yesterday - 2L UFG. Next HD tomorrow, either here or as outpatient. 3.  Hypertension/volume: BP controlled, will try to lower EDW to see if overall volume reduction will help symptoms. 4.  Anemia of ESRD: Hgb 9.4 - for Aranesp 61mg today. 5.   Metabolic bone disease: Ca/Phos stable. On Phoslo 1/meals. 6.  T2DM   KVeneta Penton PA-C 01/28/2021, 8:04 AM  [employer]

## 2021-01-28 NOTE — Progress Notes (Signed)
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 Patient also reports difficulty swallowing but denies any fever and chills.  Vitals and labs are at baseline. Vascular surgery Dr. Carlis Abbott was consulted, patient underwent fistulogram  On 4/4, tolerated well, the graft was patent,  the central veins were patent,  there was no indication for intervention. Nephrology was consulted for continuation of hemodialysis.  Patient reports left arm swelling is improving.  Patient underwent hemodialysis today. Next HD 4/6.  Assessment & Plan:   Principal Problem:   Swelling of face Active Problems:   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HCC)   Facial swelling  Swelling involving the left side of face, neck and left arm above the AV fistula: Patient presented with worsening swelling of the left side of face,  Neck,  left arm above AV fistula. Vascular surgery Dr. Carlis Abbott was consulted,  who recommended fistulogram. He underwent fistulogram, graft was patent, central veins were patent and there was no indication for intervention. Patient report left arm swelling is improving. Patient had similar swelling last month and underwent drug-coated angioplasty. Follow-up vascular surgery.  ESRD on hemodialysis: Nephrology consulted for continuation of hemodialysis. Patient had dialysis today out of schedule,  next hemodialysis 4/6  Anemia of chronic disease.: Hemoglobin remained stable.9.4  History of diabetes: Not on any diabetic medication,   Blood sugars controlled.  DVT  prophylaxis:Heparin Code Status: Full code. Family Communication: No family at bed side. Disposition Plan:   Status is: Observation  The patient remains OBS appropriate and will d/c before 2 midnights.  Dispo: The patient is from: Home              Anticipated d/c is to: Home              Patient currently is not medically stable to d/c.   Difficult to place patient No   Consultants:   Nephrology, vascular surgery.  Procedures: Left arm shuntogram 4/4   Antimicrobials:   Anti-infectives (From admission, onward)   None     Subjective: Patient was seen and examined at 8 hemodialysis suit.  Patient was getting dialysis.   She reports left arm swelling has improved .  She reports feeling dizzy and tired.  Blood pressure has been low today.  Objective: Vitals:   01/28/21 1045 01/28/21 1055 01/28/21 1102 01/28/21 1133  BP: 98/67 98/67 (!) 120/58 94/85  Pulse:    97  Resp:  '19 20 18  '$ Temp:  (!) 97.5 F (36.4 C)  97.7 F (36.5 C)  TempSrc:  Oral  Oral  SpO2:  100%    Weight:  70.1 kg    Height:        Intake/Output Summary (Last 24 hours) at 01/28/2021 1501 Last data filed at 01/28/2021 1327 Gross per 24 hour  Intake 360 ml  Output 959 ml  Net -599 ml   Filed Weights   01/27/21 1537 01/28/21 0720 01/28/21 1055  Weight: 70.1 kg 71 kg 70.1 kg    Examination:  General exam: Appears calm and comfortable , not in any  acute distress. Respiratory system: Clear to auscultation. Respiratory effort normal. Cardiovascular system: S1 & S2 heard, RRR. No JVD, murmurs, rubs, gallops or clicks. No pedal edema. Gastrointestinal system: Abdomen is nondistended, soft and nontender. No organomegaly or masses felt. Normal bowel sounds heard. Central nervous system: Alert and oriented. No focal neurological deficits. Extremities: Symmetric 5 x 5 power.  Left arm swelling is improving. Skin: No rashes, lesions or ulcers Psychiatry: Judgement and insight appear normal. Mood & affect  appropriate.     Data Reviewed: I have personally reviewed following labs and imaging studies  CBC: Recent Labs  Lab 01/26/21 2017 01/27/21 0755 01/28/21 0220  WBC 5.0 6.0 4.7  NEUTROABS 2.4  --   --   HGB 10.2* 10.1* 9.4*  HCT 33.2* 33.2* 29.8*  MCV 97.4 97.6 96.8  PLT 142* 123* 0000000*   Basic Metabolic Panel: Recent Labs  Lab 01/26/21 2017 01/27/21 0755 01/28/21 0220  NA 139  --  138  K 3.7  --  3.5  CL 94*  --  97*  CO2 36*  --  30  GLUCOSE 178*  --  110*  BUN 15  --  21  CREATININE 7.47* 8.04* 9.21*  CALCIUM 9.3  --  9.2  MG  --   --  2.4  PHOS  --   --  3.8   GFR: Estimated Creatinine Clearance: 5.4 mL/min (A) (by C-G formula based on SCr of 9.21 mg/dL (H)). Liver Function Tests: No results for input(s): AST, ALT, ALKPHOS, BILITOT, PROT, ALBUMIN in the last 168 hours. No results for input(s): LIPASE, AMYLASE in the last 168 hours. No results for input(s): AMMONIA in the last 168 hours. Coagulation Profile: Recent Labs  Lab 01/26/21 2017  INR 1.0   Cardiac Enzymes: No results for input(s): CKTOTAL, CKMB, CKMBINDEX, TROPONINI in the last 168 hours. BNP (last 3 results) No results for input(s): PROBNP in the last 8760 hours. HbA1C: No results for input(s): HGBA1C in the last 72 hours. CBG: Recent Labs  Lab 01/27/21 2055 01/28/21 0049 01/28/21 0429 01/28/21 1131 01/28/21 1311  GLUCAP 96 120* 105* 67* 125*   Lipid Profile: No results for input(s): CHOL, HDL, LDLCALC, TRIG, CHOLHDL, LDLDIRECT in the last 72 hours. Thyroid Function Tests: Recent Labs    01/26/21 2017  TSH 2.011   Anemia Panel: No results for input(s): VITAMINB12, FOLATE, FERRITIN, TIBC, IRON, RETICCTPCT in the last 72 hours. Sepsis Labs: No results for input(s): PROCALCITON, LATICACIDVEN in the last 168 hours.  Recent Results (from the past 240 hour(s))  Urine culture     Status: None   Collection Time: 01/18/21  8:52 PM   Specimen: Urine, Catheterized  Result Value Ref  Range Status   Specimen Description URINE, CATHETERIZED  Final   Special Requests NONE  Final   Culture   Final    NO GROWTH Performed at [facility], [address] [place], [place] [address]    Report Status 01/20/2021 FINAL  Final  Resp Panel by RT-PCR (Flu A&B, Covid) Nasopharyngeal Swab     Status: None   Collection Time: 01/27/21  5:43 AM   Specimen: Nasopharyngeal Swab; Nasopharyngeal(NP) swabs in vial transport medium  Result Value Ref Range Status   SARS Coronavirus 2 by RT PCR NEGATIVE NEGATIVE Final    Comment: (NOTE) SARS-CoV-2 target nucleic acids are NOT DETECTED.  The SARS-CoV-2 RNA is generally detectable in upper respiratory specimens during the acute phase of infection. The lowest concentration of SARS-CoV-2 viral  copies this assay can detect is 138 copies/mL. A negative result does not preclude SARS-Cov-2 infection and should not be used as the sole basis for treatment or other patient management decisions. A negative result may occur with  improper specimen collection/handling, submission of specimen other than nasopharyngeal swab, presence of viral mutation(s) within the areas targeted by this assay, and inadequate number of viral copies(<138 copies/mL). A negative result must be combined with clinical observations, patient history, and epidemiological information. The expected result is Negative.  Fact Sheet for Patients:  EntrepreneurPulse.com.au  Fact Sheet for Healthcare Providers:  IncredibleEmployment.be  This test is no t yet approved or cleared by the [place] FDA and  has been authorized for detection and/or diagnosis of SARS-CoV-2 by FDA under an Emergency Use Authorization (EUA). This EUA will remain  in effect (meaning this test can be used) for the duration of the COVID-19 declaration under Section 564(b)(1) of the Act, 21 U.S.C.section 360bbb-3(b)(1), unless the authorization is terminated  or  revoked sooner.       Influenza A by PCR NEGATIVE NEGATIVE Final   Influenza B by PCR NEGATIVE NEGATIVE Final    Comment: (NOTE) The Xpert Xpress SARS-CoV-2/FLU/RSV plus assay is intended as an aid in the diagnosis of influenza from Nasopharyngeal swab specimens and should not be used as a sole basis for treatment. Nasal washings and aspirates are unacceptable for Xpert Xpress SARS-CoV-2/FLU/RSV testing.  Fact Sheet for Patients: EntrepreneurPulse.com.au  Fact Sheet for Healthcare Providers: IncredibleEmployment.be  This test is not yet approved or cleared by the [place] FDA and has been authorized for detection and/or diagnosis of SARS-CoV-2 by FDA under an Emergency Use Authorization (EUA). This EUA will remain in effect (meaning this test can be used) for the duration of the COVID-19 declaration under Section 564(b)(1) of the Act, 21 U.S.C. section 360bbb-3(b)(1), unless the authorization is terminated or revoked.  Performed at [facility], [place] [address] [place], [place] [address]     Radiology Studies: PERIPHERAL VASCULAR CATHETERIZATION  Result Date: 01/27/2021 Patient name: LUMA TORRICO MRN: [id number] DOB: [birth year] Sex: female 01/27/2021 Pre-operative Diagnosis: End-stage renal disease, facial swelling Post-operative diagnosis:  Same Surgeon:  Eda Paschal. Donzetta Matters, MD Procedure Performed: 1.  Ultrasound-guided cannulation left upper arm AV graft 2.  Left upper extremity fistulogram Indications: 75 year old female on dialysis via left upper arm AV graft.  She has history of facial swelling and hoarseness.  She has undergone balloon angioplasty of the outflow with drug-coated balloon now has recurrent symptoms and is indicated for repeat fistulogram. Findings: The graft is patent.  The central veins are patent.  There is no indication for intervention.  Procedure:  The patient was identified in the holding area and taken to room 8.   The patient was then placed supine on the table and prepped and draped in the usual sterile fashion.  A time out was called.  Ultrasound was used to evaluate the left arm AV graft.  There is anesthetized 1% lidocaine.  The graft was then cannulated with micropuncture needle followed by wire and sheath under direct ultrasound visualization.  And images saved the permanent record.  Left upper extremity fistulogram including central venography was performed.  No intervention was undertaken.  The area was suture-ligated with 4-0 Monocryl and the sheath removed.  She tolerated procedure without any complication. Contrast: 24cc Brandon C. Donzetta Matters, MD [facility] Office: [phone] Pager: [phone]   Scheduled Meds: . calcium acetate  667  mg Oral TID WC  . Darbepoetin Alfa      . heparin  5,000 Units Subcutaneous Q8H  . heparin sodium (porcine)       Continuous Infusions:   LOS: 0 days    Time spent: 35 mins    Sulma Ruffino, MD [facility]   If 7PM-7AM, please contact night-coverage

## 2021-01-29 ENCOUNTER — Telehealth: Payer: Self-pay | Admitting: Family Medicine

## 2021-01-29 DIAGNOSIS — R220 Localized swelling, mass and lump, head: Secondary | ICD-10-CM | POA: Diagnosis not present

## 2021-01-29 LAB — GLUCOSE, CAPILLARY
Glucose-Capillary: 163 mg/dL — ABNORMAL HIGH (ref 70–99)
Glucose-Capillary: 164 mg/dL — ABNORMAL HIGH (ref 70–99)
Glucose-Capillary: 172 mg/dL — ABNORMAL HIGH (ref 70–99)

## 2021-01-29 NOTE — Progress Notes (Signed)
No recurrent graft stenosis as noted on fistulogram Monday with Dr. Donzetta Matters.  Good thrill in upper arm loop graft.  Feels her arm swelling is better.  Unclear etiology for face swelling given no central stenosis.  Vascular will sign off.  Marty Heck, MD [facility] Office: [place]

## 2021-01-29 NOTE — Telephone Encounter (Signed)
Pts daughter-in law Levada Dy called and said that pts insurance is only sending out somebody for 8 hours per month but that pt needs more care, she said that pt chronic kidney disease, really bad vision, she needs help being dressed and also has trouble getting food. She also needs a walker. She said the insurance company suggested calling and having an order put in so she can have more hours of coverage. Best callback: [phone]

## 2021-01-29 NOTE — Progress Notes (Signed)
Late Entry: Renal Navigator appreciates update from CM/W. [place] that patient should discharge in the am per attending. Navigator and CM met at bedside to plan outpatient HD tomorrow. We got patient's son on speaker phone. He and patient agreed to plan and states he can pick patient up at discharge.   Alphonzo Cruise, [place] Renal Navigator [phone]

## 2021-01-29 NOTE — Discharge Instructions (Signed)
Advised to follow-up with primary care physician in 1 week. Advised to follow-up with nephrology as scheduled. Advised to continue hemodialysis as scheduled outpatient. Patient fistulogram was completed,  AV graft was patent without any obstruction.

## 2021-01-29 NOTE — Progress Notes (Signed)
Appreciate assistance from MD/Dr. Dwyane Dee in getting patient discharged early today in order for her to go to outpatient HD on day of discharge in order to allow space in inpt unit for patients who are not being discharged.  Navigator followed up with patient to offer support and ensure she knows that her outpatient HD clinic is expecting her today (normal day). Outpatient HD orders have already been sent by PA. Bedside RN notified. [name] on the way to get her and take her to HD. As long as patient arrives by noon, she will get HD treatment today.  Alphonzo Cruise, [place] [facility]al Navigator [phone]

## 2021-01-29 NOTE — Progress Notes (Signed)
DISCHARGE NOTE HOME Amy [name] to be discharged Home per MD order. Discussed prescriptions and follow up appointments with the patient. Prescriptions given to patient; medication list explained in detail. Patient verbalized understanding.  Skin clean, dry and intact without evidence of skin break down, no evidence of skin tears noted. IV catheter discontinued intact. Site without signs and symptoms of complications. Dressing and pressure applied. Pt denies pain at the site currently. No complaints noted.  Patient free of lines, drains, and wounds.   An After Visit Summary (AVS) was printed and given to the patient. Patient escorted via wheelchair, and discharged home via private auto.  Mikki Santee, RN

## 2021-01-29 NOTE — Discharge Summary (Addendum)
Physician Discharge Summary  Amy [name] PJS:[id number] DOB: 1946-05-05 DOA: 01/26/2021  PCP: [name], MD  Admit date: 01/26/2021 .  Discharge date: 01/29/2021.  Admitted From: Home.  Disposition:  Home.  Recommendations for Outpatient Follow-up:  1. Follow up with PCP in 1-2 weeks. 2. Please obtain BMP/CBC in one week. 3. Advised to follow-up with Nephrology as scheduled. 4. Advised to continue hemodialysis as scheduled outpatient. 5. Patient fistulogram was completed,  AV graft was patent without any obstruction.  Home Health: None. Equipment/Devices: None  Discharge Condition: Stable. 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 She was discharged home,  then has recurrence of swelling which is gradually getting worse. Patient also reportsdifficulty swallowing but denies any fever and chills.  Patient was admitted for left arm swelling. Vitals and labs were at baseline.Vascular surgery Dr. Carlis Abbott was consulted, patient underwent fistulogram on 4/4, tolerated well, the graft was patent,  the central veins were patent,  there was no indication for intervention.Nephrology was consulted for continuation of hemodialysis. Patient reports left arm swelling is improving.  Patient underwent hemodialysis 4/5.  Next HD 4/6.  Patient was cleared from vascular surgery to be discharged.  Unclear etiology for face swelling given no central stenosis.  Vascular surgery signed off.  Patient will get hemodialysis at her outpatient hemodialysis suite today. Facial and Left arm swelling improved. Patient is being discharged home.   She was managed for below  problems.   Discharge Diagnoses:  Principal Problem:   Swelling of face Active Problems:   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HCC)   Facial swelling  Swelling involving the left side of face, neck and left arm above the AV fistula: Patient presented with worsening swelling of the left side of face,  Neck,  left arm above AV fistula. Vascular surgery Dr. Carlis Abbott was consulted,  who recommended fistulogram. He underwent fistulogram, graft was patent, central veins were patent and there was no indication for intervention. Patient report left arm swelling is improving. Patient had similar swelling last month and underwent drug-coated angioplasty. Follow-up vascular surgery.  ESRD on hemodialysis: Nephrology consulted for continuation of hemodialysis. Patient had dialysis today out of schedule,  next hemodialysis 4/6 at Out patient HD suite.  Anemia of chronic disease.: Hemoglobin remained stable.9.4  History of diabetes: Not on any diabetic medication,   Blood sugars controlled.  Discharge Instructions  Discharge Instructions    Call MD for:  difficulty breathing, headache or visual disturbances   Complete by: As directed    Call MD for:  persistant dizziness or light-headedness   Complete by: As directed    Call MD for:  persistant nausea and vomiting   Complete by: As directed    Diet - low sodium heart healthy   Complete by: As directed    Diet Carb Modified   Complete by: As directed    Discharge instructions   Complete by: As directed    Advised to follow-up with primary care physician in 1 week. Advised to follow-up with nephrology as scheduled. Advised to continue hemodialysis as scheduled outpatient. Patient fistulogram was completed,  AV graft was patent without any obstruction.   Increase activity slowly   Complete by: As directed  Allergies as of 01/29/2021      Reactions   Sensipar [cinacalcet] Other (See Comments)   Other reaction(s): Unknown       Medication List    TAKE these medications   acetaminophen 325 MG tablet Commonly known as: TYLENOL Take 650 mg by mouth every 6 (six) hours as needed for moderate pain or headache.   blood glucose meter kit and supplies Kit Dispense based on patient and insurance preference. Use up to four times daily as directed. (FOR ICD-9 250.00, 250.01).   calcium acetate 667 MG capsule Commonly known as: PHOSLO Take 667 mg by mouth 3 (three) times daily with meals.   FreeStyle Libre 14 Day Sensor Misc 1 Units by Does not apply route every 14 (fourteen) days.   lactulose 10 GM/15ML solution Commonly known as: CHRONULAC Take 60 mLs by mouth daily as needed for mild constipation.   lidocaine-prilocaine cream Commonly known as: EMLA Apply 1 application topically. 1-2  Hours prior to dialysis   MIRCERA IJ Mircera   Voltaren 1 % Gel Generic drug: diclofenac Sodium Apply 2-4 g topically 4 (four) times daily as needed (knee pain).       Follow-up Information    [name], MD Follow up in 1 week(s).   Specialty: Family Medicine Contact information: [place] [address] [phone]              Allergies  Allergen Reactions  . Sensipar [Cinacalcet] Other (See Comments)    Other reaction(s): Unknown    Consultations:  Nephrology  Vascular surgery   Procedures/Studies: CT Head Wo Contrast  Result Date: 01/18/2021 CLINICAL DATA:  Altered level of consciousness EXAM: CT HEAD WITHOUT CONTRAST TECHNIQUE: Contiguous axial images were obtained from the base of the skull through the vertex without intravenous contrast. COMPARISON:  01/24/2017 FINDINGS: Brain: Scattered hypodensities within the periventricular white matter and bilateral basal ganglia consistent with chronic small vessel ischemic changes. No acute infarct or hemorrhage. Lateral ventricles and midline structures are unremarkable. No acute extra-axial fluid collections. No mass  effect. Vascular: No hyperdense vessel or unexpected calcification. Skull: Normal. Negative for fracture or focal lesion. Sinuses/Orbits: No acute finding. Other: None. IMPRESSION: 1. Chronic small-vessel ischemic changes throughout the white matter. 2. No acute intracranial process. Electronically Signed   By: Randa Ngo M.D.   On: 01/18/2021 17:01   CT Soft Tissue Neck W Contrast  Result Date: 01/08/2021 CLINICAL DATA:  Initial evaluation for soft tissue mass, swelling. EXAM: CT NECK WITH CONTRAST TECHNIQUE: Multidetector CT imaging of the neck was performed using the standard protocol following the bolus administration of intravenous contrast. CONTRAST:  61m OMNIPAQUE IOHEXOL 300 MG/ML  SOLN COMPARISON:  None. FINDINGS: Pharynx and larynx: Oral cavity within normal limits no visible acute abnormality about the dentition. Palatine tonsils symmetric and within normal limits. Few small calcified tonsilliths noted on the right. Nasopharynx normal. Layering retropharyngeal effusion seen throughout the retropharyngeal space without discrete retropharyngeal abscess. Epiglottis itself within normal limits. Vallecula largely effaced. Supraglottic airway is narrowed but remains patent. Glottis is closed and not well assessed. Subglottic airway clear. Salivary glands: Salivary glands including the parotid and submandibular glands within normal limits. Thyroid: Few scattered subcentimeter thyroid nodules noted, largest of which measures 4 mm. Findings felt to be of doubtful significance given size and patient age, no follow-up imaging recommended (ref: J Am Coll Radiol. 2015 Feb;12(2): 143-50). Lymph nodes: No pathologically enlarged lymph nodes seen within the neck. Vascular: Normal intravascular enhancement  seen throughout the neck. Limited intracranial: Unremarkable. Visualized orbits: Globes and orbital soft tissues demonstrate no acute finding. Mastoids and visualized paranasal sinuses: Visualized paranasal  sinuses are clear. Mastoid air cells and middle ear cavities are well pneumatized and free of fluid. Skeleton: No acute osseous finding. No discrete or worrisome osseous lesions. Bones are somewhat diffusely sclerotic in appearance, suggesting underlying renal osteodystrophy. Upper chest: 5 mm left upper lobe nodule partially visualized (series 3, image 98). Partially visualized lungs are otherwise clear. Visualized upper chest demonstrates no other acute finding. Other: Diffuse soft tissue stranding seen throughout the subcutaneous fat of the submental region, with inferior extension along the anterior neck towards the upper anterior chest wall. Hazy stranding extends laterally to involve the masticator, parapharyngeal, and carotid spaces as well. No extension into the floor of mouth. Findings are nonspecific, but could reflect an acute inflammatory response/angio edema. Possible infection/cellulitis would be the primary differential consideration. No loculated or drainable fluid collections IMPRESSION: 1. Diffuse soft tissue stranding throughout the subcutaneous fat of the neck to involve the submental region as well as the bilateral masticator, parapharyngeal, and carotid spaces, with inferior extension along the anterior neck towards the upper anterior chest wall. Findings are nonspecific, but could reflect an acute inflammatory response/angio edema. Possible infection/cellulitis would be the primary differential consideration. No loculated or drainable fluid collections. 2. Associated layering retropharyngeal effusion, likely reactive. Secondary narrowing of the supraglottic airway. 3. 5 mm left upper lobe pulmonary nodule, partially visualized, indeterminate. No follow-up needed if patient is low-risk. Non-contrast chest CT can be considered in 12 months if patient is high-risk. This recommendation follows the consensus statement: Guidelines for Management of Incidental Pulmonary Nodules Detected on CT Images:  From the Fleischner Society 2017; Radiology 2017; 284:228-243. Electronically Signed   By: Jeannine Boga M.D.   On: 01/08/2021 23:03   CT CHEST W CONTRAST  Result Date: 01/08/2021 CLINICAL DATA:  Facial swelling. EXAM: CT CHEST WITH CONTRAST TECHNIQUE: Multidetector CT imaging of the chest was performed during intravenous contrast administration. Right arm contrast injection. CONTRAST:  21m OMNIPAQUE IOHEXOL 300 MG/ML  SOLN COMPARISON:  Right upper extremity duplex earlier today. FINDINGS: Cardiovascular: No upper extremity central venous catheters on the current exam. Dense IV contrast within the right subclavian vein. Left subclavian vein is patent. Slight narrowing at the brachiocephalic SVC confluence, partially obscured by dense IV contrast in the right brachiocephalic vein, series 6 image 96. No evidence of SVC thrombus. No SVC narrowing. Heart is normal in size. No pericardial fluid. Cannot assess for pulmonary embolus given phase of contrast. The thoracic aorta is normal in caliber. The left vertebral artery arises directly from the thoracic aorta. Mild aortic atherosclerosis. Mediastinum/Nodes: No enlarged mediastinal or hilar lymph nodes. No suspicious thyroid nodule. No esophageal wall thickening. Lungs/Pleura: Elevation of the right hemidiaphragm with adjacent compressive atelectasis or scarring in the right middle and lower lobes. Bandlike opacity in the dependent left lower lobe, may be atelectasis or scarring, but technically indeterminate. No pleural fluid. The trachea and central bronchi are patent. Upper Abdomen: Renal atrophy consistent with known chronic renal disease. No acute upper abdominal findings. Musculoskeletal: Mild generalized subcutaneous edema. Ground-glass densities throughout the osseous structures consistent with renal osteodystrophy. No acute osseous abnormalities are seen. Advanced left shoulder degenerative change. IMPRESSION: 1. Slight narrowing at the  brachiocephalic SVC confluence, partially obscured by dense IV contrast in the right brachiocephalic vein. No distal SVC narrowing or evidence of SVC thrombus. 2. Elevation of the right hemidiaphragm  with adjacent compressive atelectasis or scarring in the right middle and lower lobes. 3. Bandlike opacity in the dependent left lower lobe, may be atelectasis or scarring, but technically indeterminate. Aortic Atherosclerosis (ICD10-I70.0). Electronically Signed   By: Keith Rake M.D.   On: 01/08/2021 21:43   PERIPHERAL VASCULAR CATHETERIZATION  Result Date: 01/27/2021 Patient name: ALEANA FIFITA MRN: [id number] DOB: 11/26/1945 Sex: female 01/27/2021 Pre-operative Diagnosis: End-stage renal disease, facial swelling Post-operative diagnosis:  Same Surgeon:  Eda Paschal. Donzetta Matters, MD Procedure Performed: 1.  Ultrasound-guided cannulation left upper arm AV graft 2.  Left upper extremity fistulogram Indications: 75 year old female on dialysis via left upper arm AV graft.  She has history of facial swelling and hoarseness.  She has undergone balloon angioplasty of the outflow with drug-coated balloon now has recurrent symptoms and is indicated for repeat fistulogram. Findings: The graft is patent.  The central veins are patent.  There is no indication for intervention.  Procedure:  The patient was identified in the holding area and taken to room 8.  The patient was then placed supine on the table and prepped and draped in the usual sterile fashion.  A time out was called.  Ultrasound was used to evaluate the left arm AV graft.  There is anesthetized 1% lidocaine.  The graft was then cannulated with micropuncture needle followed by wire and sheath under direct ultrasound visualization.  And images saved the permanent record.  Left upper extremity fistulogram including central venography was performed.  No intervention was undertaken.  The area was suture-ligated with 4-0 Monocryl and the sheath removed.  She tolerated  procedure without any complication. Contrast: 24cc Brandon C. Donzetta Matters, MD [facility] Office: [phone] Pager: [phone]   PERIPHERAL VASCULAR CATHETERIZATION  Result Date: 01/10/2021  PATIENT: KHAMARI YOUSUF                                                              MRN: [id number] DOB: 09/23/1946                                              DATE OF PROCEDURE: 01/10/2021  INDICATIONS:   MELVINE JULIN is a 75 y.o. female with a left upper arm AV graft.  She presented with some facial and neck swelling and presents for a fistulogram.  PROCEDURE:   1.  Conscious sedation 2.  Ultrasound-guided access to left AV graft 3.  Fistulogram left upper arm loop AV graft 4.  Drug-coated balloon angioplasty of anastomotic stenosis  SURGEON: Judeth Cornfield. Scot Dock, MD, FACS  ANESTHESIA: Local with sedation  EBL: Minimal  TECHNIQUE: The patient was brought to the peripheral vascular lab and was sedated. The period of conscious sedation was 56 minutes.  During that time period, I was present face-to-face 100% of the time.  The patient was administered 1 mg of Versed and 25 mcg of fentanyl. The patient's heart rate, blood pressure, and oxygen saturation were monitored by the nurse continuously during the procedure.  The left arm was prepped and draped in usual sterile fashion.  It was somewhat difficult to palpate the venous limb of the graft so I used  ultrasound to identify the venous limb of the graft.  After the skin was anesthetized under ultrasound guidance I cannulated the venous limb of the graft with a micropuncture needle and a micropuncture sheath was introduced over a wire.  There was no clot in the graft at this level.  A real-time image was sent to the server.  I then placed a micropuncture sheath.  A fistulogram was done to a valuate the graft from the point of cannulation to include the central veins.  There was no central venous stenosis.  There was a 70% narrowing  at the venous anastomosis and I elected to address this with venoplasty.  I upsized to a 6 [place] sheath and the patient received 2000 units of IV heparin.  I initially selected a 6 mm x 40 mm balloon which was inflated to 20 atm for 1 minute.  There was some residual stenosis.  I went back with an 8 mm x 40 mm balloon and inflated this to 18 atm for 1 minute.  This showed less than 15% residual stenosis.  I would then went back with a 8 mm x 40 mm drug-coated balloon and this was inflated extending slightly past the previous area of angioplasty and was inflated to 6 atm for 2-1/2 minutes.  Completion film showed an excellent result with less than 15% residual stenosis.  A 4-0 Monocryl was placed around the cannulation site.  The patient tolerated the procedure well was transferred to the recovery room in stable condition.  All needle and sponge counts were correct.  FINDINGS:  1.  There is no central venous stenosis to explain her facial swelling. 2.  The left upper arm loop AV graft is widely patent. 3.  There is no stenosis at the arterial anastomosis. 4.  There was a 70% stenosis at the venous anastomosis that was successfully addressed with drug-coated angioplasty as described above.  [name], MD, FACS [facility]  DATE OF DICTATION:   01/10/2021    US Venous Img Upper Uni Right(DVT)  Result Date: 01/08/2021 CLINICAL DATA:  Swelling EXAM: Right UPPER EXTREMITY VENOUS DOPPLER ULTRASOUND TECHNIQUE: Gray-scale sonography with graded compression, as well as color Doppler and duplex ultrasound were performed to evaluate the upper extremity deep venous system from the level of the subclavian vein and including the jugular, axillary, basilic, radial, ulnar and upper cephalic vein. Spectral Doppler was utilized to evaluate flow at rest and with distal augmentation maneuvers. COMPARISON:  None. FINDINGS: Contralateral Subclavian Vein:  Not image Internal Jugular Vein:  Narrowed appearing with eccentric thrombus/wall thickening. Color flow present. Subclavian Vein: No evidence of thrombus. Normal compressibility, respiratory phasicity and response to augmentation. Axillary Vein: No evidence of thrombus. Normal compressibility, respiratory phasicity and response to augmentation. Cephalic Vein: No evidence of thrombus. Normal compressibility, respiratory phasicity and response to augmentation. Basilic Vein: No evidence of thrombus. Normal compressibility, respiratory phasicity and response to augmentation. Brachial Veins: No evidence of thrombus. Normal compressibility, respiratory phasicity and response to augmentation. Radial Veins: No evidence of thrombus. Normal compressibility, respiratory phasicity and response to augmentation. Ulnar Veins: No evidence of thrombus. Normal compressibility, respiratory phasicity and response to augmentation. Venous Reflux:  None visualized. Other Findings:  None visualized. IMPRESSION: 1. 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 2. Remainder of the right upper extremity deep venous system is patent and without evidence for DVT. Electronically Signed   By: Donavan Foil  M.D.   On: 01/08/2021 19:55   DG CHEST PORT 1 VIEW  Result Date: 01/28/2021 CLINICAL DATA:  Short of breath and cough.  ESRD EXAM: PORTABLE CHEST 1 VIEW COMPARISON:  01/18/2021 FINDINGS: Heart size and vascularity normal. Elevated right hemidiaphragm with right lower lobe atelectasis unchanged. No acute infiltrate or effusion. No change from the prior study. IMPRESSION: No active disease. Electronically Signed   By: Franchot Gallo M.D.   On: 01/28/2021 15:41   DG Chest Port 1 View  Result Date: 01/18/2021 CLINICAL DATA:  Weakness and confusion. EXAM: PORTABLE CHEST 1 VIEW COMPARISON:  01/08/2021 chest CT and prior studies FINDINGS: The cardiomediastinal silhouette is unremarkable.  RIGHT hemidiaphragm elevation and RIGHT basilar scarring again noted. There is no evidence of focal airspace disease, pulmonary edema, suspicious pulmonary nodule/mass, pleural effusion, or pneumothorax. No acute bony abnormalities are identified. IMPRESSION: No active disease. Electronically Signed   By: Margarette Canada M.D.   On: 01/18/2021 16:09      Subjective: Patient was seen and examined at bedside.  Overnight events noted.  Left arm swelling has improved,  facial swelling has improved.  Patient has tolerated dialysis yesterday.  Patient is cleared from vascular surgery. patient wants to be discharged.  Discharge Exam: Vitals:   01/29/21 0432 01/29/21 0516  BP: 117/61 (!) 105/45  Pulse: 92 91  Resp: 15 18  Temp: 97.7 F (36.5 C) 98.6 F (37 C)  SpO2: 93% 97%   Vitals:   01/28/21 1648 01/28/21 2033 01/29/21 0432 01/29/21 0516  BP: (!) 92/52 (!) 122/59 117/61 (!) 105/45  Pulse: 94 95 92 91  Resp: 18 15 15 18   Temp: 98 F (36.7 C) 98.1 F (36.7 C) 97.7 F (36.5 C) 98.6 F (37 C)  TempSrc: Oral Oral Oral   SpO2: 97% 96% 93% 97%  Weight:      Height:        General: Pt is alert, awake, not in acute distress Cardiovascular: RRR, S1/S2 +, no rubs, no gallops Respiratory: CTA bilaterally, no wheezing, no rhonchi Abdominal: Soft, NT, ND, bowel sounds + Extremities: no edema, no cyanosis, Left arm swelling improving.    The results of significant diagnostics from this hospitalization (including imaging, microbiology, ancillary and laboratory) are listed below for reference.     Microbiology: Recent Results (from the past 240 hour(s))  Resp Panel by RT-PCR (Flu A&B, Covid) Nasopharyngeal Swab     Status: None   Collection Time: 01/27/21  5:43 AM   Specimen: Nasopharyngeal Swab; Nasopharyngeal(NP) swabs in vial transport medium  Result Value Ref Range Status   SARS Coronavirus 2 by RT PCR NEGATIVE NEGATIVE Final    Comment: (NOTE) SARS-CoV-2 target nucleic acids are NOT  DETECTED.  The SARS-CoV-2 RNA is generally detectable in upper respiratory specimens during the acute phase of infection. The lowest concentration of SARS-CoV-2 viral copies this assay can detect is 138 copies/mL. A negative result does not preclude SARS-Cov-2 infection and should not be used as the sole basis for treatment or other patient management decisions. A negative result may occur with  improper specimen collection/handling, submission of specimen other than nasopharyngeal swab, presence of viral mutation(s) within the areas targeted by this assay, and inadequate number of viral copies(<138 copies/mL). A negative result must be combined with clinical observations, patient history, and epidemiological information. The expected result is Negative.  Fact Sheet for Patients:  EntrepreneurPulse.com.au  Fact Sheet for Healthcare Providers:  IncredibleEmployment.be  This test is no t yet approved or  cleared by the [place] and  has been authorized for detection and/or diagnosis of SARS-CoV-2 by FDA under an Emergency Use Authorization (EUA). This EUA will remain  in effect (meaning this test can be used) for the duration of the COVID-19 declaration under Section 564(b)(1) of the Act, 21 U.S.C.section 360bbb-3(b)(1), unless the authorization is terminated  or revoked sooner.       Influenza A by PCR NEGATIVE NEGATIVE Final   Influenza B by PCR NEGATIVE NEGATIVE Final    Comment: (NOTE) The Xpert Xpress SARS-CoV-2/FLU/RSV plus assay is intended as an aid in the diagnosis of influenza from Nasopharyngeal swab specimens and should not be used as a sole basis for treatment. Nasal washings and aspirates are unacceptable for Xpert Xpress SARS-CoV-2/FLU/RSV testing.  Fact Sheet for Patients: EntrepreneurPulse.com.au  Fact Sheet for Healthcare Providers: IncredibleEmployment.be  This test is not yet  approved or cleared by the [place] FDA and has been authorized for detection and/or diagnosis of SARS-CoV-2 by FDA under an Emergency Use Authorization (EUA). This EUA will remain in effect (meaning this test can be used) for the duration of the COVID-19 declaration under Section 564(b)(1) of the Act, 21 U.S.C. section 360bbb-3(b)(1), unless the authorization is terminated or revoked.  Performed at [facility], [facility] [address] [place], [place] [address]      Labs: BNP (last 3 results) No results for input(s): BNP in the last 8760 hours. Basic Metabolic Panel: Recent Labs  Lab 01/26/21 2017 01/27/21 0755 01/28/21 0220  NA 139  --  138  K 3.7  --  3.5  CL 94*  --  97*  CO2 36*  --  30  GLUCOSE 178*  --  110*  BUN 15  --  21  CREATININE 7.47* 8.04* 9.21*  CALCIUM 9.3  --  9.2  MG  --   --  2.4  PHOS  --   --  3.8   Liver Function Tests: No results for input(s): AST, ALT, ALKPHOS, BILITOT, PROT, ALBUMIN in the last 168 hours. No results for input(s): LIPASE, AMYLASE in the last 168 hours. No results for input(s): AMMONIA in the last 168 hours. CBC: Recent Labs  Lab 01/26/21 2017 01/27/21 0755 01/28/21 0220  WBC 5.0 6.0 4.7  NEUTROABS 2.4  --   --   HGB 10.2* 10.1* 9.4*  HCT 33.2* 33.2* 29.8*  MCV 97.4 97.6 96.8  PLT 142* 123* 137*   Cardiac Enzymes: No results for input(s): CKTOTAL, CKMB, CKMBINDEX, TROPONINI in the last 168 hours. BNP: Invalid input(s): POCBNP CBG: Recent Labs  Lab 01/28/21 1621 01/28/21 2034 01/29/21 0006 01/29/21 0431 01/29/21 0734  GLUCAP 200* 191* 164* 163* 172*   D-Dimer No results for input(s): DDIMER in the last 72 hours. Hgb A1c No results for input(s): HGBA1C in the last 72 hours. Lipid Profile No results for input(s): CHOL, HDL, LDLCALC, TRIG, CHOLHDL, LDLDIRECT in the last 72 hours. Thyroid function studies Recent Labs    01/26/21 2017  TSH 2.011   Anemia work up No results for input(s): VITAMINB12,  FOLATE, FERRITIN, TIBC, IRON, RETICCTPCT in the last 72 hours. Urinalysis    Component Value Date/Time   COLORURINE YELLOW 01/18/2021 2052   APPEARANCEUR CLEAR 01/18/2021 2052   LABSPEC 1.015 01/18/2021 2052   PHURINE 8.0 01/18/2021 2052   GLUCOSEU 50 (A) 01/18/2021 2052   HGBUR NEGATIVE 01/18/2021 2052   BILIRUBINUR NEGATIVE 01/18/2021 2052   [place] NEGATIVE 01/18/2021 2052   PROTEINUR 100 (A) 01/18/2021  2052   NITRITE NEGATIVE 01/18/2021 2052   LEUKOCYTESUR NEGATIVE 01/18/2021 2052   Sepsis Labs Invalid input(s): PROCALCITONIN,  WBC,  LACTICIDVEN Microbiology Recent Results (from the past 240 hour(s))  Resp Panel by RT-PCR (Flu A&B, Covid) Nasopharyngeal Swab     Status: None   Collection Time: 01/27/21  5:43 AM   Specimen: Nasopharyngeal Swab; Nasopharyngeal(NP) swabs in vial transport medium  Result Value Ref Range Status   SARS Coronavirus 2 by RT PCR NEGATIVE NEGATIVE Final    Comment: (NOTE) SARS-CoV-2 target nucleic acids are NOT DETECTED.  The SARS-CoV-2 RNA is generally detectable in upper respiratory specimens during the acute phase of infection. The lowest concentration of SARS-CoV-2 viral copies this assay can detect is 138 copies/mL. A negative result does not preclude SARS-Cov-2 infection and should not be used as the sole basis for treatment or other patient management decisions. A negative result may occur with  improper specimen collection/handling, submission of specimen other than nasopharyngeal swab, presence of viral mutation(s) within the areas targeted by this assay, and inadequate number of viral copies(<138 copies/mL). A negative result must be combined with clinical observations, patient history, and epidemiological information. The expected result is Negative.  Fact Sheet for Patients:  EntrepreneurPulse.com.au  Fact Sheet for Healthcare Providers:  IncredibleEmployment.be  This test is no t yet approved  or cleared by the [place] FDA and  has been authorized for detection and/or diagnosis of SARS-CoV-2 by FDA under an Emergency Use Authorization (EUA). This EUA will remain  in effect (meaning this test can be used) for the duration of the COVID-19 declaration under Section 564(b)(1) of the Act, 21 U.S.C.section 360bbb-3(b)(1), unless the authorization is terminated  or revoked sooner.       Influenza A by PCR NEGATIVE NEGATIVE Final   Influenza B by PCR NEGATIVE NEGATIVE Final    Comment: (NOTE) The Xpert Xpress SARS-CoV-2/FLU/RSV plus assay is intended as an aid in the diagnosis of influenza from Nasopharyngeal swab specimens and should not be used as a sole basis for treatment. Nasal washings and aspirates are unacceptable for Xpert Xpress SARS-CoV-2/FLU/RSV testing.  Fact Sheet for Patients: EntrepreneurPulse.com.au  Fact Sheet for Healthcare Providers: IncredibleEmployment.be  This test is not yet approved or cleared by the [place] FDA and has been authorized for detection and/or diagnosis of SARS-CoV-2 by FDA under an Emergency Use Authorization (EUA). This EUA will remain in effect (meaning this test can be used) for the duration of the COVID-19 declaration under Section 564(b)(1) of the Act, 21 U.S.C. section 360bbb-3(b)(1), unless the authorization is terminated or revoked.  Performed at [facility], [place] [address] [place], [place] [address]      Time coordinating discharge: Over 30 minutes  SIGNED:   Shawna Clamp, MD  [facility] 01/29/2021, 1:50 PM Pager   If 7PM-7AM, please contact night-coverage [email or web address]

## 2021-01-29 NOTE — Progress Notes (Addendum)
[facility] Progress Note   Subjective:   Seen in room. Arm edema looks subjective better today. No CP or dyspnea. Plan is for d/c this AM to make it to her usual OP HD.  Objective Vitals:   01/28/21 1648 01/28/21 2033 01/29/21 0432 01/29/21 0516  BP: (!) 92/52 (!) 122/59 117/61 (!) 105/45  Pulse: 94 95 92 91  Resp: '18 15 15 18  '$ Temp: 98 F (36.7 C) 98.1 F (36.7 C) 97.7 F (36.5 C) 98.6 F (37 C)  TempSrc: Oral Oral Oral   SpO2: 97% 96% 93% 97%  Weight:      Height:       Physical Exam General: Well appearing woman, NAD Heart: RRR; no murmur Lungs: CTA anteriorly Abdomen: soft, non-tender Extremities: No LE edema, 1+ LUE edema Dialysis Access: LUE AVG + bruit  Additional Objective Labs: Basic Metabolic Panel: Recent Labs  Lab 01/26/21 2017 01/27/21 0755 01/28/21 0220  NA 139  --  138  K 3.7  --  3.5  CL 94*  --  97*  CO2 36*  --  30  GLUCOSE 178*  --  110*  BUN 15  --  21  CREATININE 7.47* 8.04* 9.21*  CALCIUM 9.3  --  9.2  PHOS  --   --  3.8   CBC: Recent Labs  Lab 01/26/21 2017 01/27/21 0755 01/28/21 0220  WBC 5.0 6.0 4.7  NEUTROABS 2.4  --   --   HGB 10.2* 10.1* 9.4*  HCT 33.2* 33.2* 29.8*  MCV 97.4 97.6 96.8  PLT 142* 123* 137*   Studies/Results: PERIPHERAL VASCULAR CATHETERIZATION  Result Date: 01/27/2021 Patient name: Amy [name] MRN: [id number] DOB: 04-10-1946 Sex: female 01/27/2021 Pre-operative Diagnosis: End-stage renal disease, facial swelling Post-operative diagnosis:  Same Surgeon:  Eda Paschal. Donzetta Matters, MD Procedure Performed: 1.  Ultrasound-guided cannulation left upper arm AV graft 2.  Left upper extremity fistulogram Indications: 75 year old female on dialysis via left upper arm AV graft.  She has history of facial swelling and hoarseness.  She has undergone balloon angioplasty of the outflow with drug-coated balloon now has recurrent symptoms and is indicated for repeat fistulogram. Findings: The graft is patent.  The  central veins are patent.  There is no indication for intervention.  Procedure:  The patient was identified in the holding area and taken to room 8.  The patient was then placed supine on the table and prepped and draped in the usual sterile fashion.  A time out was called.  Ultrasound was used to evaluate the left arm AV graft.  There is anesthetized 1% lidocaine.  The graft was then cannulated with micropuncture needle followed by wire and sheath under direct ultrasound visualization.  And images saved the permanent record.  Left upper extremity fistulogram including central venography was performed.  No intervention was undertaken.  The area was suture-ligated with 4-0 Monocryl and the sheath removed.  She tolerated procedure without any complication. Contrast: 24cc Brandon C. Donzetta Matters, MD [facility] Office: [phone] Pager: [phone]   DG CHEST PORT 1 VIEW  Result Date: 01/28/2021 CLINICAL DATA:  Short of breath and cough.  ESRD EXAM: PORTABLE CHEST 1 VIEW COMPARISON:  01/18/2021 FINDINGS: Heart size and vascularity normal. Elevated right hemidiaphragm with right lower lobe atelectasis unchanged. No acute infiltrate or effusion. No change from the prior study. IMPRESSION: No active disease. Electronically Signed   By: Franchot Gallo M.D.   On: 01/28/2021 15:41   Medications:  .  calcium acetate  667 mg Oral TID WC  . heparin  5,000 Units Subcutaneous Q8H    Dialysis Orders: MWF at [facility] 3:45hr, 400/500, EDW 70.5kg, 2K/2Ca, UFP #2, AVG, heparin 7000 unit bolus - Hectoral 87mg IV q HD - Mircera 522m IV q 4 weeks (last 12/30/20) - Parsabiv 2.'5mg'$  IV q 2 weeks (just ordered, not yet given)  Assessment/Plan: 1. L arm/facial/neck edema: Chronic/recurrent issue, s/p shuntogram 3/16 with 70% outflow stenosis, treated with balloon angioplasty with some improvement. Now s/p another shuntogram 4/4 which was completely normal/patent. Discussed with patient, some people  get atypical asymmetric edematous areas in setting of volume overload, ?assume that is what is happening with her. Will try to get overall volume down by lowering EDW and follow.  Pt for dc today, will try to gradually lower dry wt in OP setting to see if localized edema will improve.  2. ESRD:Usual MWF schedule - HD today as OP. 3. Hypertension/volume:BP controlled, lowering EDW to 69kg as outpatient. 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 5. Metabolic bone disease:Ca/Phos stable. On Phoslo 1/meals. 6. T2DM   KatVeneta PentonA-C 01/29/2021, 9:12 AM  [facility]   Pt seen, examined and agree w assess/plan as above with additions as indicated.  [facility] 01/29/2021, 11:32 AM

## 2021-01-30 ENCOUNTER — Encounter (HOSPITAL_COMMUNITY): Payer: Medicare Other

## 2021-01-30 ENCOUNTER — Telehealth (HOSPITAL_COMMUNITY): Payer: Self-pay | Admitting: Nephrology

## 2021-01-30 ENCOUNTER — Telehealth: Payer: Self-pay

## 2021-01-30 ENCOUNTER — Ambulatory Visit: Payer: Medicare Other

## 2021-01-30 NOTE — Telephone Encounter (Signed)
Appointment scheduled for evaluation for below messages.

## 2021-01-30 NOTE — Telephone Encounter (Signed)
Pt's daughter in law, Levada Dy, called again today following up on the previous message regarding in home care. Advised patient it could take 24 -48 to respond to respond.

## 2021-01-30 NOTE — Telephone Encounter (Signed)
Transition Care Management Unsuccessful Follow-up Telephone Call  Date of discharge and from where:  01/29/21-[place]  Attempts:  1st Attempt  Reason for unsuccessful TCM follow-up call:  Unable to leave message

## 2021-01-30 NOTE — Telephone Encounter (Signed)
Transition Care Management Unsuccessful Follow-up Telephone Call  Date of discharge and from where:  01/29/21-[place]  Attempts:  2nd Attempt  Reason for unsuccessful TCM follow-up call:  Left voice message

## 2021-01-30 NOTE — Telephone Encounter (Signed)
Transition of care contact from inpatient facility  Date of discharge: 01/29/2021 Date of contact: 01/30/2021 Method: Phone Spoke to: Patient  Patient contacted to discuss transition of care from recent inpatient hospitalization. Patient was admitted to [facility] from 4/3 - 01/29/2021 with discharge diagnosis of L arm edema.  Medication changes were reviewed. No changes.  She did go to her HD session yesterday - no issues there. Plan is to continue to inch down her EDW to help with edema. Next HD tomorrow.  She has reached out to her PCP to get order for shower bench and walker. Told she can ask Tess (SW at her HD unit) to assist with this as well.  Veneta Penton, PA-C [employer] Pager [phone]

## 2021-01-31 ENCOUNTER — Telehealth: Payer: Self-pay

## 2021-01-31 NOTE — Telephone Encounter (Signed)
Transition Care Management Follow-up Telephone Call  Date of discharge and from where: 01/29/2021-[place]  How have you been since you were released from the hospital? Doing a little better. Not sleeping well  Any questions or concerns? No  Items Reviewed:  Did the pt receive and understand the discharge instructions provided? Yes   Medications obtained and verified? Yes   Other? Yes   Any new allergies since your discharge? No   Dietary orders reviewed? Yes  Do you have support at home? Yes   Home Care and Equipment/Supplies: Were home health services ordered? no If so, what is the name of the agency? n/a  Has the agency set up a time to come to the patient's home? not applicable Were any new equipment or medical supplies ordered?  No What is the name of the medical supply agency? n/a Were you able to get the supplies/equipment? not applicable Do you have any questions related to the use of the equipment or supplies?n/a  Functional Questionnaire: (I = Independent and D = Dependent) ADLs: I with assitance  Bathing/Dressing- I with assistance  Meal Prep- D  Eating- I  Maintaining continence- I with assistance  Transferring/Ambulation- I with assistance  Managing Meds- I  Follow up appointments reviewed:   PCP Hospital f/u appt confirmed? Yes  Scheduled to see Dr. Ethelene Hal on 02/06/2021 @ 11:30.  [facility] f/u appt confirmed? Yes  date unknown-pt has appt  Are transportation arrangements needed? No   If their condition worsens, is the pt aware to call PCP or go to the Emergency Dept.? Yes  Was the patient provided with contact information for the PCP's office or ED? Yes  Was to pt encouraged to call back with questions or concerns? Yes

## 2021-02-06 ENCOUNTER — Encounter: Payer: Self-pay | Admitting: Family Medicine

## 2021-02-06 ENCOUNTER — Other Ambulatory Visit: Payer: Self-pay

## 2021-02-06 ENCOUNTER — Ambulatory Visit (INDEPENDENT_AMBULATORY_CARE_PROVIDER_SITE_OTHER): Payer: Medicare Other | Admitting: Family Medicine

## 2021-02-06 VITALS — BP 118/68 | HR 96 | Temp 97.3°F | Ht 67.0 in | Wt 147.6 lb

## 2021-02-06 DIAGNOSIS — E1122 Type 2 diabetes mellitus with diabetic chronic kidney disease: Secondary | ICD-10-CM

## 2021-02-06 DIAGNOSIS — E1165 Type 2 diabetes mellitus with hyperglycemia: Secondary | ICD-10-CM

## 2021-02-06 DIAGNOSIS — Z09 Encounter for follow-up examination after completed treatment for conditions other than malignant neoplasm: Secondary | ICD-10-CM | POA: Diagnosis not present

## 2021-02-06 DIAGNOSIS — N186 End stage renal disease: Secondary | ICD-10-CM | POA: Diagnosis not present

## 2021-02-06 DIAGNOSIS — IMO0002 Reserved for concepts with insufficient information to code with codable children: Secondary | ICD-10-CM

## 2021-02-06 DIAGNOSIS — Z992 Dependence on renal dialysis: Secondary | ICD-10-CM

## 2021-02-06 LAB — BASIC METABOLIC PANEL
BUN: 11 mg/dL (ref 6–23)
CO2: 35 mEq/L — ABNORMAL HIGH (ref 19–32)
Calcium: 9.5 mg/dL (ref 8.4–10.5)
Chloride: 94 mEq/L — ABNORMAL LOW (ref 96–112)
Creatinine, Ser: 5.05 mg/dL (ref 0.40–1.20)
GFR: 8.04 mL/min — CL (ref >60.00)
Glucose, Bld: 84 mg/dL (ref 70–99)
Potassium: 3.9 mEq/L (ref 3.5–5.1)
Sodium: 139 mEq/L (ref 135–145)

## 2021-02-06 LAB — CBC
HCT: 34 % — ABNORMAL LOW (ref 36.0–46.0)
Hemoglobin: 11 g/dL — ABNORMAL LOW (ref 12.0–15.0)
MCHC: 32.3 g/dL (ref 30.0–36.0)
MCV: 93.8 fl (ref 78.0–100.0)
Platelets: 131 10*3/uL — ABNORMAL LOW (ref 150.0–400.0)
RBC: 3.63 Mil/uL — ABNORMAL LOW (ref 3.87–5.11)
RDW: 16.4 % — ABNORMAL HIGH (ref 11.5–15.5)
WBC: 4.8 10*3/uL (ref 4.0–10.5)

## 2021-02-06 NOTE — Progress Notes (Signed)
Established Patient Office Visit  Subjective:  Patient ID: Amy [name], female    DOB: 05/13/1946  Age: 75 y.o. MRN: [id number]  CC:  Chief Complaint  Patient presents with  . Follow-up    Evaluation for home health aid to  stay at the home assisting patient longer than 8 hours a month.     HPI Amy [name] presents for hospital discharge follow-up.  Patient had been admitted for facial and left upper extremity swelling.  Patient underwent a fistulogram on 4 4 noting that the graft and veins were patent.  Patient was cleared for dialysis and discharge.  Continues with dialysis on Monday Wednesday Friday.  Patient is accompanied by her son today who is requesting closer follow-up with visiting home health nursing.  They both feel as though the swelling has improved since her hospitalization.  Past Medical History:  Diagnosis Date  . Chronic kidney disease    dialysis M-W-F - dialysis cath located in right side of chest  . Diabetes mellitus without complication (HCC)    type 2 - no meds   . HLD (hyperlipidemia)    diet controlled  . Hypertension    no meds    Past Surgical History:  Procedure Laterality Date  . A/V FISTULAGRAM Left 01/10/2021   Procedure: A/V FISTULAGRAM;  Surgeon: Angelia Mould, MD;  Location: [place] [facility];  Service: Cardiovascular;  Laterality: Left;  . A/V FISTULAGRAM N/A 01/27/2021   Procedure: A/V FISTULAGRAM;  Surgeon: Waynetta Sandy, MD;  Location: [place] [facility];  Service: Cardiovascular;  Laterality: N/A;  . AV FISTULA PLACEMENT Left 09/24/2020   Procedure: LEFT UPPER ARM ARTERIOVENOUS GORTEX  GRAFT;  Surgeon: Elam Dutch, MD;  Location: [place];  Service: Vascular;  Laterality: Left;  . BREAST CYST EXCISION Right 2011   cyst removed -neg  . EYE SURGERY Bilateral    cataracts  . INSERTION OF DIALYSIS CATHETER Right 07/2019  . PERIPHERAL VASCULAR BALLOON ANGIOPLASTY  01/10/2021   Procedure: PERIPHERAL VASCULAR  BALLOON ANGIOPLASTY;  Surgeon: Angelia Mould, MD;  Location: [place] [facility];  Service: Cardiovascular;;  left AV graft  . TUBAL LIGATION    . Ultrasound-guided cannulation left upper arm AV graft  01/27/2021    Family History  Problem Relation Age of Onset  . Breast cancer Neg Hx     Social History   Socioeconomic History  . Marital status: Single    Spouse name: Not on file  . Number of children: Not on file  . Years of education: Not on file  . Highest education level: Not on file  Occupational History  . Not on file  Tobacco Use  . Smoking status: Former Research scientist (life sciences)  . Smokeless tobacco: Never Used  Vaping Use  . Vaping Use: Never used  Substance and Sexual Activity  . Alcohol use: Yes    Comment: occasional  . Drug use: Not Currently    Types: Marijuana    Comment: Last use 2020  . Sexual activity: Not on file    Comment: Tubal  Other Topics Concern  . Not on file  Social History Narrative  . Not on file   Social Determinants of Health   Financial Resource Strain: Low Risk   . Difficulty of Paying Living Expenses: Not hard at all  Food Insecurity: No Food Insecurity  . Worried About Charity fundraiser in the Last Year: Never true  . Ran Out of Food in the  Last Year: Never true  Transportation Needs: No Transportation Needs  . Lack of Transportation (Medical): No  . Lack of Transportation (Non-Medical): No  Physical Activity: Inactive  . Days of Exercise per Week: 0 days  . Minutes of Exercise per Session: 0 min  Stress: No Stress Concern Present  . Feeling of Stress : Not at all  Social Connections: Moderately Integrated  . Frequency of Communication with Friends and Family: More than three times a week  . Frequency of Social Gatherings with Friends and Family: Never  . Attends Religious Services: More than 4 times per year  . Active Member of Clubs or Organizations: Yes  . Attends Archivist Meetings: More than 4 times per year  .  Marital Status: Never married  Intimate Partner Violence: Not At Risk  . Fear of Current or Ex-Partner: No  . Emotionally Abused: No  . Physically Abused: No  . Sexually Abused: No    Outpatient Medications Prior to Visit  Medication Sig Dispense Refill  . acetaminophen (TYLENOL) 325 MG tablet Take 650 mg by mouth every 6 (six) hours as needed for moderate pain or headache.    . blood glucose meter kit and supplies KIT Dispense based on patient and insurance preference. Use up to four times daily as directed. (FOR ICD-9 250.00, 250.01). 1 each 0  . calcium acetate (PHOSLO) 667 MG capsule Take 667 mg by mouth 3 (three) times daily with meals.    . Continuous Blood Gluc Sensor (FREESTYLE LIBRE 14 DAY SENSOR) MISC 1 Units by Does not apply route every 14 (fourteen) days. 2 each 0  . diclofenac Sodium (VOLTAREN) 1 % GEL Apply 2-4 g topically 4 (four) times daily as needed (knee pain).    [name] lactulose (CHRONULAC) 10 GM/15ML solution Take 60 mLs by mouth daily as needed for mild constipation.    . lidocaine-prilocaine (EMLA) cream Apply 1 application topically. 1-2  Hours prior to dialysis    . Methoxy PEG-Epoetin Beta (MIRCERA IJ) Mircera    . venlafaxine XR (EFFEXOR-XR) 75 MG 24 hr capsule Take 75 mg by mouth daily.    . Etelcalcetide HCl (PARSABIV IV) Etelcalcetide (Parsabiv)     No facility-administered medications prior to visit.    Allergies  Allergen Reactions  . Sensipar [Cinacalcet] Other (See Comments)    Other reaction(s): Unknown    ROS Review of Systems  Constitutional: Negative.   HENT: Negative.   Respiratory: Negative for chest tightness and wheezing.   Cardiovascular: Negative for chest pain and palpitations.  Gastrointestinal: Negative.   Neurological: Negative for speech difficulty.  Psychiatric/Behavioral: Negative.       Objective:    Physical Exam Vitals and nursing note reviewed.  Constitutional:      General: She is not in acute distress.     Appearance: She is not toxic-appearing or diaphoretic.  HENT:     Head: Normocephalic and atraumatic.     Right Ear: External ear normal.     Left Ear: External ear normal.  Eyes:     General: No scleral icterus.       Right eye: No discharge.        Left eye: No discharge.     Conjunctiva/sclera: Conjunctivae normal.  Neck:     Vascular: No carotid bruit.  Cardiovascular:     Rate and Rhythm: Normal rate and regular rhythm.  Pulmonary:     Effort: No accessory muscle usage or respiratory distress.     Breath sounds:  Decreased breath sounds present.  Musculoskeletal:     Cervical back: No rigidity or tenderness.     Right lower leg: No edema.     Left lower leg: No edema.  Skin:    General: Skin is warm and dry.  Neurological:     Mental Status: She is oriented to person, place, and time.  Psychiatric:        Mood and Affect: Mood normal.        Behavior: Behavior normal.     BP 118/68   Pulse 96   Temp (!) 97.3 F (36.3 C) (Temporal)   Ht _0  (1.702 m)   Wt 147 lb 9.6 oz (67 kg)   LMP  (LMP Unknown)   SpO2 96%   BMI 23.12 kg/m  Wt Readings from Last 3 Encounters:  02/06/21 147 lb 9.6 oz (67 kg)  01/28/21 154 lb 8.7 oz (70.1 kg)  01/16/21 152 lb 9.6 oz (69.2 kg)     Health Maintenance Due  Topic Date Due  . HEMOGLOBIN A1C  Never done  . Hepatitis C Screening  Never done  . FOOT EXAM  Never done  . OPHTHALMOLOGY EXAM  Never done  . URINE MICROALBUMIN  Never done  . TETANUS/TDAP  Never done  . COLONOSCOPY (Pts 45-45yr Insurance coverage will need to be confirmed)  Never done  . DEXA SCAN  Never done  . PNA vac Low Risk Adult (1 of 2 - PCV13) Never done  . MAMMOGRAM  07/01/2018  . COVID-19 Vaccine (2 - Booster for Janssen series) 04/22/2020    There are no preventive care reminders to display for this patient.  Lab Results  Component Value Date   TSH 2.011 01/26/2021   Lab Results  Component Value Date   WBC 4.7 01/28/2021   HGB 9.4 (L)  01/28/2021   HCT 29.8 (L) 01/28/2021   MCV 96.8 01/28/2021   PLT 137 (L) 01/28/2021   Lab Results  Component Value Date   NA 138 01/28/2021   K 3.5 01/28/2021   CO2 30 01/28/2021   GLUCOSE 110 (H) 01/28/2021   BUN 21 01/28/2021   CREATININE 9.21 (H) 01/28/2021   BILITOT 0.5 01/18/2021   ALKPHOS 114 01/18/2021   AST 17 01/18/2021   ALT 9 01/18/2021   PROT 6.9 01/18/2021   ALBUMIN 3.6 01/18/2021   CALCIUM 9.2 01/28/2021   ANIONGAP 11 01/28/2021   No results found for: CHOL No results found for: HDL No results found for: LDLCALC No results found for: TRIG No results found for: CHOLHDL No results found for: HGBA1C    Assessment & Plan:   Problem List Items Addressed This Visit      Other   Hospital discharge follow-up - Primary   Relevant Orders   Basic metabolic panel   CBC    Other Visit Diagnoses    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   Follow-up: Return in about 3 months (around 05/08/2021), or if symptoms worsen or fail to improve.    WLibby Maw MD

## 2021-02-12 ENCOUNTER — Telehealth: Payer: Self-pay | Admitting: Family Medicine

## 2021-02-12 DIAGNOSIS — R5381 Other malaise: Secondary | ICD-10-CM

## 2021-02-12 DIAGNOSIS — Z9181 History of falling: Secondary | ICD-10-CM

## 2021-02-12 NOTE — Telephone Encounter (Signed)
Brian(pt's son) is wanting a cb concerning his mom's most recent labs. Please advise at [phone].

## 2021-02-12 NOTE — Telephone Encounter (Signed)
Spoke with patients son who verbally understood labs and recommendations. Per Amy [name] his call was to check on labs but also wanting to know the status of referral for home care for patient. Advised Amy [name] that a company was contacted but they are no longer taking ne clients and we are waiting for response for another company. Brain states that patient is continuing to fall and needing assistance so the looked u a place they would like to know if we could contact to see if they would be able to come out. [facility] [place] [phone] have come out to their house for evaluation but they are not sure if insurance will cover someone to assist. Can you check on this please.

## 2021-02-12 NOTE — Telephone Encounter (Signed)
Labs in chart patients son aware that they have not been viewed by doctor as of now but we will call as soon as labs are viewed. Please advise.

## 2021-02-12 NOTE — Telephone Encounter (Signed)
Labs showed stable Hgb which is good.  Kidney function was slightly improved but patient will continue to need dialysis 3 times weekly. Please follow nephrology instructions for this. They will also manage diabetes.

## 2021-02-12 NOTE — Telephone Encounter (Signed)
I talked to pts son and he is okay with waiting until next week which is when most places called said they could start next week. It was requested to change start of care date to week of the 25th if possible

## 2021-02-12 NOTE — Telephone Encounter (Signed)
I tried to call several times and cant get through to anyone. Ive tried to call 4 or 5 other ones and they all say the same thing that they would be able to start next week but not able to do anything this week. Will still try for the one he mentioned

## 2021-02-17 ENCOUNTER — Telehealth: Payer: Self-pay

## 2021-02-17 NOTE — Telephone Encounter (Signed)
Occupational Therapist from [facility] called asking for verbal orders to delay therapy until 02/25/21 per patients request.  Her call back number is [phone]

## 2021-02-17 NOTE — Telephone Encounter (Signed)
The location and tried to get pt with wasn't able to take pt due to staffing shortages. I talked with Levada Dy with [facility] and they are short staffed with nursing as well but said they can probably get someone in sooner to see pt if you can add Occupational Therapy and possibly Physical Therapy. Please let me know if you are able to add

## 2021-02-17 NOTE — Telephone Encounter (Signed)
PT and OT ordered

## 2021-02-18 NOTE — Telephone Encounter (Signed)
Spoke with Cecille Rubin with [facility]. who states that they do not do home care/assistance only PT and OT. Patients family would like home care assistance for patient. [name] (patients son) wanting to know patients insurance could be called to see if they would cover someone to come out longer than 8 hours a month. [facility] phone [phone] does home care I called they said they would be able to go out to the home if patients insurance will cover. Please check on this if new referral needed Dr. Ethelene Hal or myself will put it in.

## 2021-02-18 NOTE — Telephone Encounter (Signed)
Spoke with patients son who states that he have called patients insurance and was told that they would cover services. Levada Schilling to give them a call back for clarification and give [place] a call back.

## 2021-02-18 NOTE — Telephone Encounter (Signed)
Home Health Aides are not covered by [facility]. If the patient/son wants to pay self pay they could utilize such a service.

## 2021-02-25 ENCOUNTER — Encounter: Payer: Medicare Other | Admitting: Family Medicine

## 2021-03-10 ENCOUNTER — Other Ambulatory Visit: Payer: Self-pay

## 2021-03-10 ENCOUNTER — Emergency Department (HOSPITAL_BASED_OUTPATIENT_CLINIC_OR_DEPARTMENT_OTHER)
Admission: EM | Admit: 2021-03-10 | Discharge: 2021-03-10 | Disposition: A | Discharge status: Home / Self Care | Payer: Medicare Other | Attending: Emergency Medicine | Admitting: Emergency Medicine

## 2021-03-10 ENCOUNTER — Other Ambulatory Visit (HOSPITAL_BASED_OUTPATIENT_CLINIC_OR_DEPARTMENT_OTHER): Payer: Self-pay

## 2021-03-10 ENCOUNTER — Encounter (HOSPITAL_BASED_OUTPATIENT_CLINIC_OR_DEPARTMENT_OTHER): Payer: Self-pay | Admitting: *Deleted

## 2021-03-10 ENCOUNTER — Emergency Department (HOSPITAL_BASED_OUTPATIENT_CLINIC_OR_DEPARTMENT_OTHER): Payer: Medicare Other

## 2021-03-10 DIAGNOSIS — E1122 Type 2 diabetes mellitus with diabetic chronic kidney disease: Secondary | ICD-10-CM | POA: Diagnosis not present

## 2021-03-10 DIAGNOSIS — R112 Nausea with vomiting, unspecified: Secondary | ICD-10-CM

## 2021-03-10 DIAGNOSIS — Z20822 Contact with and (suspected) exposure to covid-19: Secondary | ICD-10-CM | POA: Insufficient documentation

## 2021-03-10 DIAGNOSIS — N186 End stage renal disease: Secondary | ICD-10-CM | POA: Insufficient documentation

## 2021-03-10 DIAGNOSIS — R000 Tachycardia, unspecified: Secondary | ICD-10-CM | POA: Insufficient documentation

## 2021-03-10 DIAGNOSIS — K2971 Gastritis, unspecified, with bleeding: Secondary | ICD-10-CM | POA: Diagnosis not present

## 2021-03-10 DIAGNOSIS — Z87891 Personal history of nicotine dependence: Secondary | ICD-10-CM | POA: Insufficient documentation

## 2021-03-10 DIAGNOSIS — K2900 Acute gastritis without bleeding: Secondary | ICD-10-CM

## 2021-03-10 DIAGNOSIS — R059 Cough, unspecified: Secondary | ICD-10-CM

## 2021-03-10 DIAGNOSIS — I120 Hypertensive chronic kidney disease with stage 5 chronic kidney disease or end stage renal disease: Secondary | ICD-10-CM | POA: Insufficient documentation

## 2021-03-10 LAB — COMPREHENSIVE METABOLIC PANEL
ALT: 12 U/L (ref 0–44)
AST: 20 U/L (ref 15–41)
Albumin: 4 g/dL (ref 3.5–5.0)
Alkaline Phosphatase: 131 U/L — ABNORMAL HIGH (ref 38–126)
Anion gap: 12 (ref 5–15)
BUN: 40 mg/dL — ABNORMAL HIGH (ref 8–23)
CO2: 35 mmol/L — ABNORMAL HIGH (ref 22–32)
Calcium: 9.2 mg/dL (ref 8.9–10.3)
Chloride: 89 mmol/L — ABNORMAL LOW (ref 98–111)
Creatinine, Ser: 7.57 mg/dL — ABNORMAL HIGH (ref 0.44–1.00)
GFR, Estimated: 5 mL/min — ABNORMAL LOW (ref >60)
Glucose, Bld: 233 mg/dL — ABNORMAL HIGH (ref 70–99)
Potassium: 2.8 mmol/L — ABNORMAL LOW (ref 3.5–5.1)
Sodium: 136 mmol/L (ref 135–145)
Total Bilirubin: 0.4 mg/dL (ref 0.3–1.2)
Total Protein: 8.4 g/dL — ABNORMAL HIGH (ref 6.5–8.1)

## 2021-03-10 LAB — CBC WITH DIFFERENTIAL/PLATELET
Abs Immature Granulocytes: 0.06 10*3/uL (ref 0.00–0.07)
Basophils Absolute: 0 10*3/uL (ref 0.0–0.1)
Basophils Relative: 0 %
Eosinophils Absolute: 0.1 10*3/uL (ref 0.0–0.5)
Eosinophils Relative: 1 %
HCT: 37 % (ref 36.0–46.0)
Hemoglobin: 11.8 g/dL — ABNORMAL LOW (ref 12.0–15.0)
Immature Granulocytes: 1 %
Lymphocytes Relative: 18 %
Lymphs Abs: 1.7 10*3/uL (ref 0.7–4.0)
MCH: 30 pg (ref 26.0–34.0)
MCHC: 31.9 g/dL (ref 30.0–36.0)
MCV: 94.1 fL (ref 80.0–100.0)
Monocytes Absolute: 0.7 10*3/uL (ref 0.1–1.0)
Monocytes Relative: 8 %
Neutro Abs: 7 10*3/uL (ref 1.7–7.7)
Neutrophils Relative %: 72 %
Platelets: 155 10*3/uL (ref 150–400)
RBC: 3.93 MIL/uL (ref 3.87–5.11)
RDW: 14.7 % (ref 11.5–15.5)
WBC: 9.6 10*3/uL (ref 4.0–10.5)
nRBC: 0 % (ref 0.0–0.2)

## 2021-03-10 LAB — RESP PANEL BY RT-PCR (FLU A&B, COVID) ARPGX2
Influenza A by PCR: NEGATIVE
Influenza B by PCR: NEGATIVE
SARS Coronavirus 2 by RT PCR: NEGATIVE

## 2021-03-10 LAB — PROTIME-INR
INR: 0.9 (ref 0.8–1.2)
Prothrombin Time: 12 seconds (ref 11.4–15.2)

## 2021-03-10 MED ORDER — PANTOPRAZOLE SODIUM 40 MG IV SOLR
40.0000 mg | Freq: Once | INTRAVENOUS | Status: AC
  Administered 2021-03-10: 40 mg via INTRAVENOUS
  Filled 2021-03-10: qty 40

## 2021-03-10 MED ORDER — PANTOPRAZOLE SODIUM 20 MG PO TBEC
20.0000 mg | DELAYED_RELEASE_TABLET | Freq: Every day | ORAL | 0 refills | Status: DC
  Filled 2021-03-10: qty 30, 30d supply, fill #0

## 2021-03-10 MED ORDER — ONDANSETRON HCL 4 MG/2ML IJ SOLN
4.0000 mg | Freq: Once | INTRAMUSCULAR | Status: AC
  Administered 2021-03-10: 4 mg via INTRAVENOUS
  Filled 2021-03-10: qty 2

## 2021-03-10 MED ORDER — POTASSIUM CHLORIDE CRYS ER 20 MEQ PO TBCR
20.0000 meq | EXTENDED_RELEASE_TABLET | Freq: Once | ORAL | Status: AC
  Administered 2021-03-10: 20 meq via ORAL
  Filled 2021-03-10: qty 1

## 2021-03-10 MED ORDER — SODIUM CHLORIDE 0.9 % IV BOLUS
500.0000 mL | Freq: Once | INTRAVENOUS | Status: AC
Start: 2021-03-10 — End: 2021-03-10
  Administered 2021-03-10: 500 mL via INTRAVENOUS

## 2021-03-10 NOTE — ED Triage Notes (Signed)
States she has not able to eat in the past 3 weeks. Here today with vomiting. States this am she saw bright red blood in the vomitus.

## 2021-03-10 NOTE — ED Notes (Signed)
Pt states she began vomiting last night Upon arrival to room pt vomiting coffee ground emesis Pt states she has not been able to eat x 3 weeks, bowels have not moved x 2 weeks. Pt is a HD pt and could not go today.   AV graft in left arm

## 2021-03-10 NOTE — ED Provider Notes (Signed)
Amy [name]   CSN: [id number] Arrival date & time: 03/10/21  1008     History Chief Complaint  Patient presents with  . Emesis    Amy [name] is a 75 y.o. female.  She has a history of end-stage renal disease dialysis Monday Wednesday Friday and last one on Friday.  She is complaining of 3 weeks of not being able to eat and 2 weeks of no bowel movements.  She has been vomiting since last night and staff noticed that there were signs of blood in the vomitus.  She denies any abdominal pain fever shortness of breath or chest pain.  Not on any blood thinners  The history is provided by the patient.  Emesis Severity:  Moderate Duration:  12 hours Timing:  Intermittent Number of daily episodes:  4 Quality:  Coffee grounds Progression:  Unchanged Chronicity:  New Recent urination:  Absent Relieved by:  None tried Worsened by:  Nothing Ineffective treatments:  None tried Associated symptoms: cough   Associated symptoms: no abdominal pain, no diarrhea, no fever, no headaches and no sore throat   Risk factors: no sick contacts        Past Medical History:  Diagnosis Date  . Chronic kidney disease    dialysis M-W-F - dialysis cath located in right side of chest  . Diabetes mellitus without complication (HCC)    type 2 - no meds   . HLD (hyperlipidemia)    diet controlled  . Hypertension    no meds    Patient Active Problem List   Diagnosis Date Noted  . Hospital discharge follow-up 02/06/2021  . Swelling of face 01/27/2021  . Facial swelling 01/27/2021  . Neck swelling 01/10/2021  . Dyslipidemia 01/09/2021  . Angio-edema 01/08/2021  . Primary osteoarthritis of right knee 12/10/2020  . Diarrhea, unspecified 07/03/2020  . Pain, unspecified 07/03/2020  . Unspecified disturbances of skin sensation 07/03/2020  . Type 2 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, with long-term current use of insulin  ([place]) 05/23/2020  . Slow transit constipation 05/09/2020  . Drug-induced constipation 05/09/2020  . Acidosis 05/01/2020  . 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 Encounter for adequacy testing for peritoneal dialysis ([place]) 05/01/2020  . Liver disease, 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  . Other dietary vitamin B12 deficiency anemia 05/01/2020  . Other disorders of electrolyte and fluid balance, not elsewhere classified 05/01/2020  . Other disorders resulting from impaired renal tubular function 05/01/2020  . Other long term (current) drug therapy 05/01/2020  . Essential hypertension 01/16/2020  . 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 ([place]) 01/16/2020  . Bilateral carpal tunnel syndrome 01/16/2020  . Abnormality of albumin 12/07/2019  . Allergy, unspecified, initial encounter 11/27/2019  . Anaphylactic shock, unspecified, initial encounter 11/27/2019  . Mild protein-calorie malnutrition ([place]) 09/25/2019  . Iron deficiency anemia, unspecified 09/22/2019  . Coagulation defect, unspecified ([place]) 09/19/2019  . Dependence on renal dialysis ([place]) 09/19/2019  . 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[place]) 09/19/2019  . Shortness of breath 09/19/2019  . Hyperkalemia 07/20/2019  . Hypocalcemia 07/20/2019  . Metabolic acidosis, increased anion gap 07/20/2019  . Nausea and vomiting 07/20/2019  . 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[place]) 04/11/2017  . Diabetic neuropathy ([place]) 04/15/2016  . Hematuria 04/15/2016  . Proteinuria due to type 2 diabetes mellitus ([place]) 04/15/2016  . 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 01/29/2016    Past Surgical History:  Procedure Laterality Date  . A/V FISTULAGRAM Left 01/10/2021  Procedure: A/V FISTULAGRAM;  Surgeon: Angelia Mould, MD;  Location: [facility];  Service: Cardiovascular;  Laterality: Left;  . A/V FISTULAGRAM N/A 01/27/2021   Procedure: A/V FISTULAGRAM;  Surgeon: Waynetta Sandy, MD;  Location: [facility];  Service: Cardiovascular;  Laterality: N/A;  . AV FISTULA PLACEMENT Left 09/24/2020   Procedure: LEFT UPPER ARM ARTERIOVENOUS GORTEX  GRAFT;  Surgeon: Elam Dutch, MD;  Location: [facility];  Service: Vascular;  Laterality: Left;  . BREAST CYST EXCISION Right 2011   cyst removed -neg  . EYE SURGERY Bilateral    cataracts  . INSERTION OF DIALYSIS CATHETER Right 07/2019  . PERIPHERAL VASCULAR BALLOON ANGIOPLASTY  01/10/2021   Procedure: PERIPHERAL VASCULAR BALLOON ANGIOPLASTY;  Surgeon: Angelia Mould, MD;  Location: [facility];  Service: Cardiovascular;;  left AV graft  . TUBAL LIGATION    . Ultrasound-guided cannulation left upper arm AV graft  01/27/2021     OB History   No obstetric history on file.     Family History  Problem Relation Age of Onset  . Breast cancer Neg Hx     Social History   Tobacco Use  . Smoking status: Former Research scientist (life sciences)  . Smokeless tobacco: Never Used  Vaping Use  . Vaping Use: Never used  Substance Use Topics  . Alcohol use: Yes    Comment: occasional  . Drug use: Not Currently    Types: Marijuana    Comment: Last use 2020    Home Medications Prior to Admission medications   Medication Sig Start Date End Date Taking? Authorizing Provider  acetaminophen (TYLENOL) 325 MG tablet Take 650 mg by mouth every 6 (six) hours as needed for moderate pain or headache.    [provider]  blood glucose meter kit and supplies KIT Dispense based on patient and insurance preference. Use up to four times daily as directed. (FOR ICD-9 250.00, 250.01). 06/10/20   [name], MD  calcium acetate (PHOSLO) 667 MG capsule Take 667 mg by mouth 3 (three) times daily with meals. 01/02/20   [provider]  Continuous Blood Gluc Sensor (FREESTYLE LIBRE 14 DAY SENSOR) MISC 1 Units by Does not apply route every 14 (fourteen) days. 05/23/20   Nche, Charlene Brooke, NP  diclofenac Sodium (VOLTAREN) 1 % GEL Apply 2-4 g topically 4 (four)  times daily as needed (knee pain).    [provider]  lactulose (CHRONULAC) 10 GM/15ML solution Take 60 mLs by mouth daily as needed for mild constipation. 01/15/21   [provider]  lidocaine-prilocaine (EMLA) cream Apply 1 application topically. 1-2  Hours prior to dialysis 01/17/21   [provider]  venlafaxine XR (EFFEXOR-XR) 75 MG 24 hr capsule Take 75 mg by mouth daily. 02/03/21   [provider]    Allergies    Sensipar [cinacalcet]  Review of Systems   Review of Systems  Constitutional: Negative for fever.  HENT: Negative for sore throat.   Eyes: Negative for visual disturbance.  Respiratory: Positive for cough. Negative for shortness of breath.   Cardiovascular: Negative for chest pain.  Gastrointestinal: Positive for vomiting. Negative for abdominal pain and diarrhea.  Genitourinary: Negative for dysuria.  Musculoskeletal: Negative for neck pain.  Skin: Negative for rash.  Neurological: Negative for headaches.    Physical Exam Updated Vital Signs BP 121/63 (BP Location: Right Arm)   Pulse (!) 108   Temp 98.4 F (36.9 C) (Oral)   Resp 18  Ht 5' 7"  (1.702 m)   Wt 61 kg   LMP  (LMP Unknown)   SpO2 96%   BMI 21.05 kg/m   Physical Exam Vitals and nursing [name] reviewed.  Constitutional:      General: She is not in acute distress.    Appearance: Normal appearance. She is well-developed.  HENT:     Head: Normocephalic and atraumatic.  Eyes:     Conjunctiva/sclera: Conjunctivae normal.  Cardiovascular:     Rate and Rhythm: Regular rhythm. Tachycardia present.     Heart sounds: No murmur heard.   Pulmonary:     Effort: Pulmonary effort is normal. No respiratory distress.     Breath sounds: Normal breath sounds.  Abdominal:     Palpations: Abdomen is soft.     Tenderness: There is no abdominal tenderness.  Musculoskeletal:        General: No deformity or signs of injury. Normal range of motion.     Cervical back: Neck  supple.     Comments: Positive thrill left upper arm.  Skin:    General: Skin is warm and dry.  Neurological:     General: No focal deficit present.     Mental Status: She is alert.     ED Results / Procedures / Treatments   Labs (all labs ordered are listed, but only abnormal results are displayed) Labs Reviewed  COMPREHENSIVE METABOLIC PANEL - Abnormal; Notable for the following components:      Result Value   Potassium 2.8 (*)    Chloride 89 (*)    CO2 35 (*)    Glucose, Bld 233 (*)    BUN 40 (*)    Creatinine, Ser 7.57 (*)    Total Protein 8.4 (*)    Alkaline Phosphatase 131 (*)    GFR, Estimated 5 (*)    All other components within normal limits  CBC WITH DIFFERENTIAL/PLATELET - Abnormal; Notable for the following components:   Hemoglobin 11.8 (*)    All other components within normal limits  RESP PANEL BY RT-PCR (FLU A&B, COVID) [id number]  PROTIME-INR    EKG None  Radiology DG Chest Port 1 View  Result Date: 03/10/2021 CLINICAL DATA:  Hematemesis. EXAM: PORTABLE CHEST 1 VIEW COMPARISON:  01/28/2021 FINDINGS: The cardiac silhouette, mediastinal and hilar contours are within normal limits and stable. Stable mild tortuosity and calcification of the thoracic aorta. Stable elevation of the right hemidiaphragm with overlying vascular crowding and streaky atelectasis. No acute pulmonary findings or pleural effusion. IMPRESSION: No acute cardiopulmonary findings. Electronically Signed   By: Marijo Sanes M.D.   On: 03/10/2021 12:57    Procedures Procedures   Medications Ordered in ED Medications  pantoprazole (PROTONIX) injection 40 mg (has no administration in time range)  ondansetron (ZOFRAN) injection 4 mg (has no administration in time range)  sodium chloride 0.9 % bolus 500 mL (has no administration in time range)    ED Course  I have reviewed the triage vital signs and the nursing notes.  Pertinent labs & imaging results that were available during my care of  the patient were reviewed by me and considered in my medical decision making (see chart for details).  Clinical Course as of 03/10/21 1716  Mon Mar 10, 2021  1428 Reassessed patient, she said she is feeling better.  Will p.o. challenge. [MB]  1610 Patient tolerating p.o.  Discussed with her and family member regarding close follow-up with PCP.  We will trial with PPI. [MB]  Clinical Course User Index [MB] Hayden Rasmussen, MD   MDM Rules/Calculators/A&P                         This patient complains of nausea and vomiting possibly coffee-ground along with constipation; this involves an extensive number of treatment Options and is a complaint that carries with it a high risk of complications and Morbidity. The differential includes peptic ulcer disease, gastritis, esophagitis, reflux, gastroenteritis, obstruction, anemia  I ordered, reviewed and interpreted labs, which included CBC with normal white count, hemoglobin low but better than baseline, chemistries consistent with renal failure, low potassium, COVID testing negative, INR normal I ordered medication IV fluids IV PPI IV Zofran with improvement in her symptoms I ordered imaging studies which included chest x-ray and I independently    visualized and interpreted imaging which showed no acute disease Additional history obtained from patient's son Previous records obtained and reviewed in epic, being worked up by ENT and vascular for upper extremity swelling and facial swelling  After the interventions stated above, I reevaluated the patient and found patient to be hemodynamically stable.  She is tolerating p.o. here without any difficulty.  Reviewed work-up with her and her son.  Will place on PPI and recommended close follow-up with her treating physicians for further evaluation.  Return instructions discussed   Final Clinical Impression(s) / ED Diagnoses Final diagnoses:  Cough  Non-intractable vomiting with nausea, unspecified  vomiting type  Acute gastritis, presence of bleeding unspecified, unspecified gastritis type    Rx / DC Orders ED Discharge Orders         Ordered    pantoprazole (PROTONIX) 20 MG tablet  Daily        03/10/21 1531           Hayden Rasmussen, MD 03/10/21 1719

## 2021-03-10 NOTE — ED Notes (Signed)
Pt tolerated fluids well.  

## 2021-03-10 NOTE — Discharge Instructions (Addendum)
You were seen in the emergency department for nausea and vomiting.  You had blood work that did not show any significant abnormalities other than a mildly low potassium.  It will be important for you to get dialysis tomorrow.  Your symptoms improved with some IV fluids and some nausea medication.  We are prescribing you an acid blocker to help with your symptoms.  Please contact your primary care doctor for close follow-up.  Return to the emergency department for any worsening or concerning symptoms.

## 2021-03-11 ENCOUNTER — Encounter: Payer: Self-pay | Admitting: Family Medicine

## 2021-03-11 ENCOUNTER — Ambulatory Visit (INDEPENDENT_AMBULATORY_CARE_PROVIDER_SITE_OTHER): Payer: Medicare Other | Admitting: Family Medicine

## 2021-03-11 VITALS — BP 118/74 | HR 103 | Temp 97.3°F | Ht 67.0 in | Wt 142.2 lb

## 2021-03-11 DIAGNOSIS — R7309 Other abnormal glucose: Secondary | ICD-10-CM | POA: Insufficient documentation

## 2021-03-11 DIAGNOSIS — K5901 Slow transit constipation: Secondary | ICD-10-CM

## 2021-03-11 DIAGNOSIS — K2970 Gastritis, unspecified, without bleeding: Secondary | ICD-10-CM | POA: Insufficient documentation

## 2021-03-11 MED ORDER — PANTOPRAZOLE SODIUM 20 MG PO TBEC: 20.0000 mg | DELAYED_RELEASE_TABLET | Freq: Every day | ORAL | 0 refills | Status: DC

## 2021-03-11 MED ORDER — LACTULOSE 10 GM/15ML PO SOLN: 40.0000 g | Freq: Every day | ORAL | 2 refills | Status: DC | PRN

## 2021-03-11 NOTE — Progress Notes (Signed)
Established Patient Office Visit  Subjective:  Patient ID: Amy [name], female    DOB: June 26, 1946  Age: 75 y.o. MRN: [id number]  CC:  Chief Complaint  Patient presents with  . Follow-up    3 month follow up patient was seen at ED. Patient states that she does not have a appetite and seems to be very weak.     HPI Amy [name] presents for follow-up status post emergency rooms visit last evening after an acute episode of vomiting with coffee-ground emesis.  Fortunately nausea and vomiting have subsided but she does complain of an ongoing decreased appetite.  Reports lack of usual energy.  Denies depression.  She has chronic constipation has not been taking lactulose.  She was diagnosed with gastritis and started on Protonix last night.  CMP and CBC were at baseline.  She is no longer diabetic she tells me.  Blood sugars have been elevated in recent lab work including last night CMP.  Past Medical History:  Diagnosis Date  . Chronic kidney disease    dialysis M-W-F - dialysis cath located in right side of chest  . Diabetes mellitus without complication (HCC)    type 2 - no meds   . HLD (hyperlipidemia)    diet controlled  . Hypertension    no meds    Past Surgical History:  Procedure Laterality Date  . A/V FISTULAGRAM Left 01/10/2021   Procedure: A/V FISTULAGRAM;  Surgeon: Angelia Mould, MD;  Location: [facility];  Service: Cardiovascular;  Laterality: Left;  . A/V FISTULAGRAM N/A 01/27/2021   Procedure: A/V FISTULAGRAM;  Surgeon: Waynetta Sandy, MD;  Location: [facility];  Service: Cardiovascular;  Laterality: N/A;  . AV FISTULA PLACEMENT Left 09/24/2020   Procedure: LEFT UPPER ARM ARTERIOVENOUS GORTEX  GRAFT;  Surgeon: Elam Dutch, MD;  Location: [facility];  Service: Vascular;  Laterality: Left;  . BREAST CYST EXCISION Right 2011   cyst removed -neg  . EYE SURGERY Bilateral    cataracts  . INSERTION OF DIALYSIS CATHETER Right  07/2019  . PERIPHERAL VASCULAR BALLOON ANGIOPLASTY  01/10/2021   Procedure: PERIPHERAL VASCULAR BALLOON ANGIOPLASTY;  Surgeon: Angelia Mould, MD;  Location: [facility];  Service: Cardiovascular;;  left AV graft  . TUBAL LIGATION    . Ultrasound-guided cannulation left upper arm AV graft  01/27/2021    Family History  Problem Relation Age of Onset  . Breast cancer Neg Hx     Social History   Socioeconomic History  . Marital status: Single    Spouse name: Not on file  . Number of children: Not on file  . Years of education: Not on file  . Highest education level: Not on file  Occupational History  . Not on file  Tobacco Use  . Smoking status: Former Research scientist (life sciences)  . Smokeless tobacco: Never Used  Vaping Use  . Vaping Use: Never used  Substance and Sexual Activity  . Alcohol use: Yes    Comment: occasional  . Drug use: Not Currently    Types: Marijuana    Comment: Last use 2020  . Sexual activity: Not on file    Comment: Tubal  Other Topics Concern  . Not on file  Social History Narrative  . Not on file   Social Determinants of Health   Financial Resource Strain: Low Risk   . Difficulty of Paying Living Expenses: Not hard at all  Food Insecurity: No Food Insecurity  .  Worried About Charity fundraiser in the Last Year: Never true  . Ran Out of Food in the Last Year: Never true  Transportation Needs: No Transportation Needs  . Lack of Transportation (Medical): No  . Lack of Transportation (Non-Medical): No  Physical Activity: Inactive  . Days of Exercise per Week: 0 days  . Minutes of Exercise per Session: 0 min  Stress: No Stress Concern Present  . Feeling of Stress : Not at all  Social Connections: Moderately Integrated  . Frequency of Communication with Friends and Family: More than three times a week  . Frequency of Social Gatherings with Friends and Family: Never  . Attends Religious Services: More than 4 times per year  . Active Member of Clubs or  Organizations: Yes  . Attends Archivist Meetings: More than 4 times per year  . Marital Status: Never married  Intimate Partner Violence: Not At Risk  . Fear of Current or Ex-Partner: No  . Emotionally Abused: No  . Physically Abused: No  . Sexually Abused: No    Outpatient Medications Prior to Visit  Medication Sig Dispense Refill  . acetaminophen (TYLENOL) 325 MG tablet Take 650 mg by mouth every 6 (six) hours as needed for moderate pain or headache.    . calcium acetate (PHOSLO) 667 MG capsule Take 667 mg by mouth 3 (three) times daily with meals.    . Etelcalcetide HCl (PARSABIV IV) Etelcalcetide (Parsabiv)    . ethyl chloride spray SPRAY 1 SMALL AMOUT ONTO THE SKIN 3 TIMES A WEEK AS NEEDED    . lidocaine-prilocaine (EMLA) cream Apply 1 application topically. 1-2  Hours prior to dialysis    . ondansetron (ZOFRAN) 4 MG tablet Take 4 mg by mouth every 6 (six) hours as needed.    . venlafaxine XR (EFFEXOR-XR) 75 MG 24 hr capsule Take 75 mg by mouth daily.    . diclofenac Sodium (VOLTAREN) 1 % GEL Apply 2-4 g topically 4 (four) times daily as needed (knee pain).    [name] lactulose (CHRONULAC) 10 GM/15ML solution Take 60 mLs by mouth daily as needed for mild constipation.    . pantoprazole (PROTONIX) 20 MG tablet Take 1 tablet (20 mg total) by mouth daily. 30 tablet 0  . blood glucose meter kit and supplies KIT Dispense based on patient and insurance preference. Use up to four times daily as directed. (FOR ICD-9 250.00, 250.01). 1 each 0  . Continuous Blood Gluc Sensor (FREESTYLE LIBRE 14 DAY SENSOR) MISC 1 Units by Does not apply route every 14 (fourteen) days. 2 each 0   No facility-administered medications prior to visit.    Allergies  Allergen Reactions  . Sensipar [Cinacalcet] Other (See Comments)    Other reaction(s): Unknown    ROS Review of Systems  Constitutional: Negative.   HENT: Negative.   Eyes: Negative for photophobia and visual disturbance.  Respiratory:  Negative.   Cardiovascular: Negative.   Gastrointestinal: Negative for abdominal distention, anal bleeding and blood in stool.  Genitourinary: Positive for decreased urine volume.  Psychiatric/Behavioral: Negative for dysphoric mood.      Objective:    Physical Exam Vitals and nursing note reviewed.  Constitutional:      General: She is not in acute distress.    Appearance: Normal appearance. She is not ill-appearing, toxic-appearing or diaphoretic.  HENT:     Head: Normocephalic and atraumatic.     Right Ear: External ear normal.     Left Ear: External ear normal.  Eyes:     General: No scleral icterus.       Right eye: No discharge.        Left eye: No discharge.     Conjunctiva/sclera: Conjunctivae normal.  Cardiovascular:     Rate and Rhythm: Normal rate and regular rhythm.  Pulmonary:     Effort: Pulmonary effort is normal.     Breath sounds: Normal breath sounds.  Abdominal:     General: Bowel sounds are normal.  Musculoskeletal:     Cervical back: No rigidity or tenderness.  Lymphadenopathy:     Cervical: No cervical adenopathy.  Skin:    General: Skin is warm and dry.  Neurological:     Mental Status: She is alert and oriented to person, place, and time.  Psychiatric:        Mood and Affect: Mood normal.        Behavior: Behavior normal.     BP 118/74   Pulse (!) 103   Temp (!) 97.3 F (36.3 C) (Temporal)   Ht 5' 7"  (1.702 m)   Wt 142 lb 3.2 oz (64.5 kg)   LMP  (LMP Unknown)   SpO2 97%   BMI 22.27 kg/m  Wt Readings from Last 3 Encounters:  03/11/21 142 lb 3.2 oz (64.5 kg)  03/10/21 134 lb 6.4 oz (61 kg)  02/06/21 147 lb 9.6 oz (67 kg)     Health Maintenance Due  Topic Date Due  . HEMOGLOBIN A1C  Never done  . FOOT EXAM  Never done  . OPHTHALMOLOGY EXAM  Never done  . URINE MICROALBUMIN  Never done  . Hepatitis C Screening  Never done  . TETANUS/TDAP  Never done  . COLONOSCOPY (Pts 45-67yr Insurance coverage will need to be confirmed)   Never done  . DEXA SCAN  Never done  . PNA vac Low Risk Adult (1 of 2 - PCV13) Never done  . MAMMOGRAM  07/01/2018  . COVID-19 Vaccine (2 - Booster for Janssen series) 04/22/2020    There are no preventive care reminders to display for this patient.  Lab Results  Component Value Date   TSH 2.011 01/26/2021   Lab Results  Component Value Date   WBC 9.6 03/10/2021   HGB 11.8 (L) 03/10/2021   HCT 37.0 03/10/2021   MCV 94.1 03/10/2021   PLT 155 03/10/2021   Lab Results  Component Value Date   NA 136 03/10/2021   K 2.8 (L) 03/10/2021   CO2 35 (H) 03/10/2021   GLUCOSE 233 (H) 03/10/2021   BUN 40 (H) 03/10/2021   CREATININE 7.57 (H) 03/10/2021   BILITOT 0.4 03/10/2021   ALKPHOS 131 (H) 03/10/2021   AST 20 03/10/2021   ALT 12 03/10/2021   PROT 8.4 (H) 03/10/2021   ALBUMIN 4.0 03/10/2021   CALCIUM 9.2 03/10/2021   ANIONGAP 12 03/10/2021   GFR 8.04 (LL) 02/06/2021   No results found for: CHOL No results found for: HDL No results found for: LDLCALC No results found for: TRIG No results found for: CHOLHDL No results found for: HGBA1C    Assessment & Plan:   Problem List Items Addressed This Visit      Digestive   Slow transit constipation   Relevant Medications   lactulose (CHRONULAC) 10 GM/15ML solution   Gastritis   Relevant Medications   pantoprazole (PROTONIX) 20 MG tablet     Other   Elevated glucose - Primary   Relevant Orders   Hemoglobin A1c  Meds ordered this encounter  Medications  . lactulose (CHRONULAC) 10 GM/15ML solution    Sig: Take 60 mLs (40 g total) by mouth daily as needed for mild constipation.    Dispense:  236 mL    Refill:  2  . pantoprazole (PROTONIX) 20 MG tablet    Sig: Take 1 tablet (20 mg total) by mouth daily.    Dispense:  30 tablet    Refill:  0    Follow-up: Return Has follow-up scheduled for 714..  Advised her to continue with Protonix and restart lactulose.  Advised her that Protonix may take a few days to work  for her.  Rechecking hemoglobin A1c with multiple elevated glucose measurements.  [name], MD

## 2021-04-21 DIAGNOSIS — K922 Gastrointestinal hemorrhage, unspecified: Secondary | ICD-10-CM | POA: Insufficient documentation

## 2021-04-21 DIAGNOSIS — I639 Cerebral infarction, unspecified: Secondary | ICD-10-CM | POA: Insufficient documentation

## 2021-05-01 ENCOUNTER — Emergency Department (HOSPITAL_COMMUNITY): Payer: Medicare Other

## 2021-05-01 ENCOUNTER — Emergency Department (HOSPITAL_COMMUNITY)
Admission: EM | Admit: 2021-05-01 | Discharge: 2021-05-02 | Disposition: A | Discharge status: Home / Self Care | Payer: Medicare Other | Attending: Emergency Medicine | Admitting: Emergency Medicine

## 2021-05-01 ENCOUNTER — Other Ambulatory Visit: Payer: Self-pay

## 2021-05-01 DIAGNOSIS — E1122 Type 2 diabetes mellitus with diabetic chronic kidney disease: Secondary | ICD-10-CM | POA: Insufficient documentation

## 2021-05-01 DIAGNOSIS — N186 End stage renal disease: Secondary | ICD-10-CM

## 2021-05-01 DIAGNOSIS — R531 Weakness: Secondary | ICD-10-CM | POA: Diagnosis present

## 2021-05-01 DIAGNOSIS — I120 Hypertensive chronic kidney disease with stage 5 chronic kidney disease or end stage renal disease: Secondary | ICD-10-CM | POA: Diagnosis not present

## 2021-05-01 DIAGNOSIS — N049 Nephrotic syndrome with unspecified morphologic changes: Secondary | ICD-10-CM

## 2021-05-01 DIAGNOSIS — Z992 Dependence on renal dialysis: Secondary | ICD-10-CM | POA: Diagnosis not present

## 2021-05-01 DIAGNOSIS — Z87891 Personal history of nicotine dependence: Secondary | ICD-10-CM | POA: Insufficient documentation

## 2021-05-01 DIAGNOSIS — E1140 Type 2 diabetes mellitus with diabetic neuropathy, unspecified: Secondary | ICD-10-CM | POA: Insufficient documentation

## 2021-05-01 DIAGNOSIS — R627 Adult failure to thrive: Secondary | ICD-10-CM

## 2021-05-01 LAB — COMPREHENSIVE METABOLIC PANEL
ALT: 13 U/L (ref 0–44)
AST: 38 U/L (ref 15–41)
Albumin: 3.2 g/dL — ABNORMAL LOW (ref 3.5–5.0)
Alkaline Phosphatase: 68 U/L (ref 38–126)
Anion gap: 13 (ref 5–15)
BUN: 12 mg/dL (ref 8–23)
CO2: 30 mmol/L (ref 22–32)
Calcium: 8.7 mg/dL — ABNORMAL LOW (ref 8.9–10.3)
Chloride: 95 mmol/L — ABNORMAL LOW (ref 98–111)
Creatinine, Ser: 4.58 mg/dL — ABNORMAL HIGH (ref 0.44–1.00)
GFR, Estimated: 10 mL/min — ABNORMAL LOW (ref >60)
Glucose, Bld: 107 mg/dL — ABNORMAL HIGH (ref 70–99)
Potassium: 3.5 mmol/L (ref 3.5–5.1)
Sodium: 138 mmol/L (ref 135–145)
Total Bilirubin: 0.9 mg/dL (ref 0.3–1.2)
Total Protein: 6.8 g/dL (ref 6.5–8.1)

## 2021-05-01 LAB — CBC WITH DIFFERENTIAL/PLATELET
Abs Immature Granulocytes: 0.05 10*3/uL (ref 0.00–0.07)
Basophils Absolute: 0 10*3/uL (ref 0.0–0.1)
Basophils Relative: 0 %
Eosinophils Absolute: 0.1 10*3/uL (ref 0.0–0.5)
Eosinophils Relative: 2 %
HCT: 30.9 % — ABNORMAL LOW (ref 36.0–46.0)
Hemoglobin: 9.6 g/dL — ABNORMAL LOW (ref 12.0–15.0)
Immature Granulocytes: 1 %
Lymphocytes Relative: 12 %
Lymphs Abs: 0.9 10*3/uL (ref 0.7–4.0)
MCH: 31 pg (ref 26.0–34.0)
MCHC: 31.1 g/dL (ref 30.0–36.0)
MCV: 99.7 fL (ref 80.0–100.0)
Monocytes Absolute: 0.8 10*3/uL (ref 0.1–1.0)
Monocytes Relative: 11 %
Neutro Abs: 5.5 10*3/uL (ref 1.7–7.7)
Neutrophils Relative %: 74 %
Platelets: 180 10*3/uL (ref 150–400)
RBC: 3.1 MIL/uL — ABNORMAL LOW (ref 3.87–5.11)
RDW: 20.2 % — ABNORMAL HIGH (ref 11.5–15.5)
WBC: 7.4 10*3/uL (ref 4.0–10.5)
nRBC: 0 % (ref 0.0–0.2)

## 2021-05-01 NOTE — ED Triage Notes (Addendum)
Pt came in from [facility] for decrease in appetite x 3 months. Pt A&Ox3 with L side deficeits following stoke aprox.3 weeks ago. Pt c/o lower back pain. No injury pt dialysis M/W/F.

## 2021-05-01 NOTE — ED Provider Notes (Signed)
[facility] EMERGENCY DEPARTMENT Provider Note   CSN: [id number] Arrival date & time: 05/01/21  2040     History Chief Complaint  Patient presents with   Failure To [place] is a 75 y.o. female.  Amy [name] presents from a skilled nursing facility for ongoing anorexia that has been present for at least 3 months.  She was admitted to [facility] in mid May.  She was found to have fluid in the retropharyngeal space.  Etiology of this was indeterminate but did not appear to be infectious.  Autoimmune work-up was reassuring.  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  It was noted that she passed a modified barium swallow study, but she require significant assistance with food choices.  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  The history is provided by the patient and the EMS personnel.  Weakness Severity:  Moderate Onset quality:  Gradual Duration:  12 weeks Timing:  Constant Progression:  Worsening Chronicity:  Chronic Context comment:  Stroke 5/22 Relieved by:  Nothing Worsened by:  Nothing Ineffective treatments:  None tried Associated symptoms: anorexia   Associated symptoms: no abdominal pain, no aphasia, no arthralgias, no chest pain, no cough, no drooling, no dysphagia, no dysuria, no fever, no headaches, no seizures, no shortness of breath and no vomiting       Past Medical History:  Diagnosis Date   Chronic kidney disease    dialysis M-W-F - dialysis cath located in right side of chest   Diabetes mellitus without complication ([place])    type 2 - no meds    HLD (hyperlipidemia)    diet controlled   Hypertension    no meds    Patient Active Problem List   Diagnosis Date Noted   Gastritis 03/11/2021   Elevated glucose 03/11/2021   Hospital discharge follow-up 02/06/2021   Swelling of face 01/27/2021   Facial swelling 01/27/2021    Neck swelling 01/10/2021   Dyslipidemia 01/09/2021   Angio-edema 01/08/2021   Primary osteoarthritis of right knee 12/10/2020   Diarrhea, unspecified 07/03/2020   Pain, unspecified 07/03/2020   Unspecified disturbances of skin sensation 07/03/2020   Type 2 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, with long-term current use of insulin ([place]) 05/23/2020   Slow transit constipation 05/09/2020   Drug-induced constipation 05/09/2020   Acidosis 05/01/2020   Disorder of lipoprotein metabolism, 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   Encounter for adequacy testing for peritoneal dialysis ([place]) 05/01/2020   Liver disease, 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   Other dietary vitamin B12 deficiency anemia 05/01/2020   Other disorders of electrolyte and fluid balance, not elsewhere classified 05/01/2020   Other disorders resulting from impaired renal tubular function 05/01/2020   Other long term (current) drug therapy 05/01/2020   Essential hypertension 01/16/2020   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 ([place]) 01/16/2020   Bilateral carpal tunnel syndrome 01/16/2020   Abnormality of albumin 12/07/2019   Allergy, unspecified, initial encounter 11/27/2019   Anaphylactic shock, unspecified, initial encounter 11/27/2019   Mild protein-calorie malnutrition ([place]) 09/25/2019   Iron deficiency anemia, unspecified 09/22/2019   Coagulation defect, unspecified ([place]) 09/19/2019   Dependence on renal dialysis ([place]) 09/19/2019   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[place]) 09/19/2019   Shortness of breath 09/19/2019   Hyperkalemia 07/20/2019   Hypocalcemia [id number]   Metabolic acidosis, increased anion gap 07/20/2019   Nausea and vomiting  07/20/2019   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[place]) 04/11/2017   Diabetic neuropathy ([place]) 04/15/2016   Hematuria 04/15/2016   Proteinuria due to type 2 diabetes mellitus ([place]) 04/15/2016   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 01/29/2016    Past Surgical History:   Procedure Laterality Date   A/V FISTULAGRAM Left 01/10/2021   Procedure: A/V FISTULAGRAM;  Surgeon: Angelia Mould, MD;  Location: [place] [facility];  Service: Cardiovascular;  Laterality: Left;   A/V FISTULAGRAM N/A 01/27/2021   Procedure: A/V FISTULAGRAM;  Surgeon: Waynetta Sandy, MD;  Location: [place] [facility];  Service: Cardiovascular;  Laterality: N/A;   AV FISTULA PLACEMENT Left 09/24/2020   Procedure: LEFT UPPER ARM ARTERIOVENOUS GORTEX  GRAFT;  Surgeon: Elam Dutch, MD;  Location: [place];  Service: Vascular;  Laterality: Left;   BREAST CYST EXCISION Right 2011   cyst removed -neg   EYE SURGERY Bilateral    cataracts   INSERTION OF DIALYSIS CATHETER Right 07/2019   PERIPHERAL VASCULAR BALLOON ANGIOPLASTY  01/10/2021   Procedure: PERIPHERAL VASCULAR BALLOON ANGIOPLASTY;  Surgeon: Angelia Mould, MD;  Location: [place] [facility];  Service: Cardiovascular;;  left AV graft   TUBAL LIGATION     Ultrasound-guided cannulation left upper arm AV graft  01/27/2021     OB History   No obstetric history on file.     Family History  Problem Relation Age of Onset   Breast cancer Neg Hx     Social History   Tobacco Use   Smoking status: Former    Pack years: 0.00   Smokeless tobacco: Never  Vaping Use   Vaping Use: Never used  Substance Use Topics   Alcohol use: Yes    Comment: occasional   Drug use: Not Currently    Types: Marijuana    Comment: Last use 2020    Home Medications Prior to Admission medications   Medication Sig Start Date End Date Taking? Authorizing Provider  acetaminophen (TYLENOL) 325 MG tablet Take 650 mg by mouth every 6 (six) hours as needed for moderate pain or headache.    [provider]  calcium acetate (PHOSLO) 667 MG capsule Take 667 mg by mouth 3 (three) times daily with meals. 01/02/20   [provider]  Etelcalcetide HCl (PARSABIV IV) Etelcalcetide Hermina Staggers) 02/17/21 01/22/22  [provider]  ethyl chloride spray SPRAY 1 SMALL AMOUT ONTO THE SKIN 3 TIMES A WEEK AS NEEDED 12/17/20   [provider]  lactulose (CHRONULAC) 10 GM/15ML solution Take 60 mLs (40 g total) by mouth daily as needed for mild constipation. 03/11/21   [name], MD  lidocaine-prilocaine (EMLA) cream Apply 1 application topically. 1-2  Hours prior to dialysis 01/17/21   [provider]  ondansetron (ZOFRAN) 4 MG tablet Take 4 mg by mouth every 6 (six) hours as needed. 03/03/21   [provider]  pantoprazole (PROTONIX) 20 MG tablet Take 1 tablet (20 mg total) by mouth daily. 03/11/21   [name], MD  venlafaxine XR (EFFEXOR-XR) 75 MG 24 hr capsule Take 75 mg by mouth daily. 02/03/21   [provider]    Allergies    Sensipar [cinacalcet]  Review of Systems   Review of Systems  Constitutional:  Negative for chills and fever.  HENT:  Negative for drooling, ear pain and sore throat.   Eyes:  Negative for pain and visual disturbance.  Respiratory:  Negative for cough and shortness of breath.   Cardiovascular:  Negative for chest pain and palpitations.  Gastrointestinal:  Positive for anorexia. Negative for abdominal pain, dysphagia and vomiting.  Genitourinary:  Negative for dysuria and hematuria.  Musculoskeletal:  Negative for arthralgias and back pain.  Skin:  Negative for color change and rash.  Neurological:  Positive for weakness. Negative for seizures, syncope and headaches.  All other systems reviewed and are negative.  Physical Exam Updated Vital Signs BP 137/78 (BP Location: Right Arm)   Pulse 96   Temp (!) 97.3 F (36.3 C) (Oral)   Resp 16   Ht '5\' 7"'$  (1.702 m)   Wt 68 kg   LMP  (LMP Unknown)   SpO2 100%   BMI 23.49 kg/m   Physical Exam Constitutional:      General: She is not in acute distress. HENT:     Head: Normocephalic and atraumatic.     Right Ear: Ear canal and external ear normal.     Left Ear: Ear canal  and external ear normal.     Ears:     Comments: I could not visualize the TM on either ear, but there was no evidence of mastoiditis    Nose: Nose normal.  Eyes:     Conjunctiva/sclera: Conjunctivae normal.  Cardiovascular:     Rate and Rhythm: Normal rate and regular rhythm.  Pulmonary:     Effort: Pulmonary effort is normal. No respiratory distress.     Breath sounds: Normal breath sounds.  Abdominal:     General: There is no distension.     Tenderness: There is no abdominal tenderness.  Genitourinary:    Comments: No sacral wounds or skin breakdown in the genital area Musculoskeletal:        General: No deformity or signs of injury.     Comments: AV fistula in left upper arm.  The entire left upper arm is somewhat edematous.  According to chart review, this is a chronic issue and has been evaluated with vascular studies.  Mild subcutaneous edema of the back  Skin:    General: Skin is warm and dry.  Neurological:     Mental Status: She is alert.     Comments: Oriented to place and person. Not fully oriented to time (thought it was December 2021.   Paresis of left upper extremity.   No other deficits noted  Psychiatric:        Mood and Affect: Mood normal.        Behavior: Behavior normal.    ED Results / Procedures / Treatments   Labs (all labs ordered are listed, but only abnormal results are displayed) Labs Reviewed  COMPREHENSIVE METABOLIC PANEL - Abnormal; Notable for the following components:      Result Value   Chloride 95 (*)    Glucose, Bld 107 (*)    Creatinine, Ser 4.58 (*)    Calcium 8.7 (*)    Albumin 3.2 (*)    GFR, Estimated 10 (*)    All other components within normal limits  CBC WITH DIFFERENTIAL/PLATELET - Abnormal; Notable for the following components:   RBC 3.10 (*)    Hemoglobin 9.6 (*)    HCT 30.9 (*)    RDW 20.2 (*)    All other components within normal limits  URINALYSIS, ROUTINE W REFLEX MICROSCOPIC    EKG None  Radiology CT Soft  Tissue Neck Wo Contrast  Result Date: 05/01/2021 CLINICAL DATA:  Initial evaluation for neck abscess, history of retropharyngeal fluid on 05/22. EXAM: CT NECK WITHOUT  CONTRAST TECHNIQUE: Multidetector CT imaging of the neck was performed following the standard protocol without intravenous contrast. COMPARISON:  Prior CT from 01/08/2021. FINDINGS: Pharynx and larynx: Oral cavity within normal limits with no visible discrete mass or collection. No localized inflammatory changes seen about the dentition. Palatine tonsils within normal limits. Few subcentimeter calcified tonsilliths noted on the right. Nasopharynx and the remainder of the oropharynx within normal limits. Diffuse retro pharyngeal edema/effusion is seen, similar to previous exam. No discrete loculated collection to suggest abscess. Epiglottis within normal limits. Vallecula clear. Remainder of the hypopharynx and supraglottic larynx within normal limits. No significant narrowing of the supraglottic airway by the retropharyngeal effusion at this time. Glottis normal. Subglottic airway patent clear. Salivary glands: Salivary glands including the parotid and submandibular glands are within normal limits. Thyroid: Unremarkable. Lymph nodes: No enlarged or pathologic adenopathy seen within the neck. Vascular: Mild atheromatous plaque noted about the aortic arch and visualized carotid siphons. Evaluation of the vascular structures otherwise limited given lack of IV contrast. Limited intracranial: Unremarkable. Visualized orbits: Visualized globes and orbital soft tissues within normal limits. Sequelae of bilateral ocular lens replacement noted. Mastoids and visualized paranasal sinuses: Small retention cyst noted within the dominant right sphenoid sinus. Visualized paranasal sinuses are otherwise clear. Right mastoid and middle ear effusion is seen. Smaller left-sided mastoid effusion present as well. Again, no abnormality seen about the nasopharynx. Skeleton:  No visible acute osseous finding. 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 No discrete or worrisome osseous lesions. Mild cervical spondylosis without high-grade spinal stenosis. Upper chest: Previously identified 5 mm left upper lobe nodule is decreased in size and slightly more regular with ground-glass appearance now measuring 4 mm, suggesting a resolving infectious and/or inflammatory nodule (series 4, image 30). Small layering bilateral pleural effusions partially visualized. Visualized upper chest demonstrates no other acute finding. Other: Diffuse soft tissue stranding seen throughout the subcutaneous fat of the neck. Involvement of the bilateral parotid and submandibular spaces as well as the submental region. Extension to involve the posterior neck and visualized upper chest wall. Appearance is similar to previous, and could be related to overall volume status/anasarca. IMPRESSION: 1. Diffuse soft tissue stranding throughout the subcutaneous fat of the neck with associated layering retropharyngeal effusion. Appearance is similar to prior neck CT from 01/08/2021, and could be related to overall volume status/anasarca. An acute inflammatory response/angioedema or possibly infection/cellulitis would be the primary differential considerations. No discrete abscess or drainable fluid collection. 2. Right mastoid and middle ear effusion, with smaller left-sided mastoid effusion. Correlation with physical exam for possible otomastoiditis recommended. 3. Small layering bilateral pleural effusions, partially visualized. 4. Interval decrease in size of 4 mm left upper lobe nodule, likely a resolving infectious and/or inflammatory nodule. Continued attention at follow-up recommended. Electronically Signed   By: Jeannine Boga M.D.   On: 05/01/2021 23:45   DG Chest Port 1 View  Result Date: 05/01/2021 CLINICAL DATA:  Evaluate  for pneumonia. Decreased appetite for 3 months. Low back pain. EXAM: PORTABLE CHEST 1 VIEW COMPARISON:  Chest radiograph 03/10/2021.  CT 01/08/2021 FINDINGS: Chronic elevation of right hemidiaphragm with adjacent atelectasis. No acute airspace disease. Normal heart size with stable mediastinal contours. Aortic atherosclerosis. No pulmonary edema, pleural effusion, or pneumothorax. No acute osseous abnormalities are seen. IMPRESSION: Chronic elevation of right hemidiaphragm with adjacent atelectasis. No evidence of pneumonia. Electronically Signed   By: Keith Rake M.D.   On: 05/01/2021 23:16  Procedures Procedures   Medications Ordered in ED Medications - No data to display  ED Course  I have reviewed the triage vital signs and the nursing notes.  Pertinent labs & imaging results that were available during my care of the patient were reviewed by me and considered in my medical decision making (see chart for details).    MDM Rules/Calculators/A&P                          Amy [name] presents for several months of decreased oral intake.  Her functional status has declined significantly in the past 2 months since having a stroke at [facility].  Dysphagia and decreased oral intake was a component of her post stroke presentation, and she was fully evaluated for the same.  She was noted to have retropharyngeal swelling, and this was also noted here in scans from March 2022.  This was fully evaluated, and the patient had swallow studies and speech pathology evaluation.  In the ED here, she appeared to be at her baseline.  She was handling her secretions and able to speak.  There was no evidence of acute neurologic deficit.  She was evaluated for evidence of dehydration or acute abnormality.  I also ordered a CT scan of her neck since this seemed to be relevant to the complaint of not eating.  While this did reveal diffuse swelling, it appears to be at her baseline.  Again, this has been  fully evaluated.  I spoke to her son about the nature of this chronic illness.  They plan to follow-up with neurology for full discussion of her prognosis.  However, the son wanted me to speak to a family friend in [place] who is in the medical field.  This family friend was very insistent that we obtain a urinalysis despite the fact that the patient makes very little urine.  They understand this will have to be obtained via catheterization.  They understand this is unlikely to change her course significantly.  Nonetheless, they did want to pursue this diagnostic intervention.  If negative, the patient will be sent back to her facility with plans for outpatient follow-up for her largely chronic issues. 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

## 2021-05-02 LAB — URINALYSIS, ROUTINE W REFLEX MICROSCOPIC
Bilirubin Urine: NEGATIVE
Glucose, UA: NEGATIVE mg/dL
Ketones, ur: 5 mg/dL — AB
Nitrite: NEGATIVE
Protein, ur: 30 mg/dL — AB
Specific Gravity, Urine: 1.019 (ref 1.005–1.030)
pH: 5 (ref 5.0–8.0)

## 2021-05-02 NOTE — ED Notes (Signed)
Discharge instructions gone over w/ facility staff who verbalized understanding. [facility] at bedside to transport pt to facility. VSS upon departure.

## 2021-05-02 NOTE — ED Notes (Signed)
[facility] at bedside to transport pt. Calling facility to give report.

## 2021-05-02 NOTE — ED Notes (Signed)
Amy [name] son [phone] states patient is going to miss dialysis if isn't discharged soon, would like to speak to someone

## 2021-05-02 NOTE — ED Notes (Signed)
Called [facility] for pt transport

## 2021-05-02 NOTE — ED Provider Notes (Signed)
12:45 AM Assumed care from Dr. Joya Gaskins, please see their note for full history, physical and decision making until this point. In brief this is a 75 y.o. year old female who presented to the ED tonight with Failure To Thrive     Multiple chronic abnormalities. Pending UA and discharge.   UA with some blood and some whites. Was catheterized to obtain. Will send culture. Will not start antibiotics for time being. Discussed results with son. No questions. Stable for d/c at this time.  Discharge instructions, including strict return precautions for new or worsening symptoms, given. Patient and/or family verbalized understanding and agreement with the plan as described.   Labs, studies and imaging reviewed by myself and considered in medical decision making if ordered. Imaging interpreted by radiology.  Labs Reviewed  COMPREHENSIVE METABOLIC PANEL - Abnormal; Notable for the following components:      Result Value   Chloride 95 (*)    Glucose, Bld 107 (*)    Creatinine, Ser 4.58 (*)    Calcium 8.7 (*)    Albumin 3.2 (*)    GFR, Estimated 10 (*)    All other components within normal limits  CBC WITH DIFFERENTIAL/PLATELET - Abnormal; Notable for the following components:   RBC 3.10 (*)    Hemoglobin 9.6 (*)    HCT 30.9 (*)    RDW 20.2 (*)    All other components within normal limits  URINALYSIS, ROUTINE W REFLEX MICROSCOPIC    DG Chest Port 1 View  Final Result    CT Soft Tissue Neck Wo Contrast  Final Result      No follow-ups on file.    Volanda Mangine, Corene Cornea, MD 05/02/21 [phone]

## 2021-05-04 LAB — URINE CULTURE: Culture: NO GROWTH

## 2021-05-19 ENCOUNTER — Other Ambulatory Visit: Payer: Self-pay

## 2021-05-19 ENCOUNTER — Inpatient Hospital Stay (HOSPITAL_COMMUNITY)
Admission: EM | Admit: 2021-05-19 | Discharge: 2021-09-10 | DRG: 393 | Disposition: A | Discharge status: Hospice | Payer: Medicare Other | Source: Skilled Nursing Facility | Attending: Internal Medicine | Admitting: Internal Medicine

## 2021-05-19 ENCOUNTER — Encounter (HOSPITAL_COMMUNITY): Payer: Self-pay

## 2021-05-19 DIAGNOSIS — I6350 Cerebral infarction due to unspecified occlusion or stenosis of unspecified cerebral artery: Secondary | ICD-10-CM | POA: Diagnosis not present

## 2021-05-19 DIAGNOSIS — G9389 Other specified disorders of brain: Secondary | ICD-10-CM | POA: Diagnosis present

## 2021-05-19 DIAGNOSIS — E11649 Type 2 diabetes mellitus with hypoglycemia without coma: Secondary | ICD-10-CM | POA: Diagnosis not present

## 2021-05-19 DIAGNOSIS — R29810 Facial weakness: Secondary | ICD-10-CM | POA: Diagnosis present

## 2021-05-19 DIAGNOSIS — K922 Gastrointestinal hemorrhage, unspecified: Secondary | ICD-10-CM | POA: Diagnosis present

## 2021-05-19 DIAGNOSIS — K6389 Other specified diseases of intestine: Secondary | ICD-10-CM | POA: Diagnosis present

## 2021-05-19 DIAGNOSIS — I82722 Chronic embolism and thrombosis of deep veins of left upper extremity: Secondary | ICD-10-CM | POA: Diagnosis present

## 2021-05-19 DIAGNOSIS — L89152 Pressure ulcer of sacral region, stage 2: Secondary | ICD-10-CM | POA: Diagnosis present

## 2021-05-19 DIAGNOSIS — R29700 NIHSS score 0: Secondary | ICD-10-CM | POA: Diagnosis not present

## 2021-05-19 DIAGNOSIS — R1319 Other dysphagia: Secondary | ICD-10-CM | POA: Diagnosis present

## 2021-05-19 DIAGNOSIS — I69354 Hemiplegia and hemiparesis following cerebral infarction affecting left non-dominant side: Secondary | ICD-10-CM

## 2021-05-19 DIAGNOSIS — D638 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: Secondary | ICD-10-CM | POA: Diagnosis present

## 2021-05-19 DIAGNOSIS — N390 Urinary tract infection, site not specified: Secondary | ICD-10-CM | POA: Diagnosis not present

## 2021-05-19 DIAGNOSIS — E871 Hypo-osmolality and hyponatremia: Secondary | ICD-10-CM | POA: Diagnosis not present

## 2021-05-19 DIAGNOSIS — E875 Hyperkalemia: Secondary | ICD-10-CM | POA: Diagnosis not present

## 2021-05-19 DIAGNOSIS — N2581 Secondary hyperparathyroidism of renal origin: Secondary | ICD-10-CM | POA: Diagnosis present

## 2021-05-19 DIAGNOSIS — Z794 Long term (current) use of insulin: Secondary | ICD-10-CM

## 2021-05-19 DIAGNOSIS — D696 Thrombocytopenia, unspecified: Secondary | ICD-10-CM | POA: Diagnosis present

## 2021-05-19 DIAGNOSIS — R109 Unspecified abdominal pain: Secondary | ICD-10-CM

## 2021-05-19 DIAGNOSIS — I9589 Other hypotension: Secondary | ICD-10-CM | POA: Diagnosis present

## 2021-05-19 DIAGNOSIS — R1115 Cyclical vomiting syndrome unrelated to migraine: Secondary | ICD-10-CM | POA: Diagnosis present

## 2021-05-19 DIAGNOSIS — R0902 Hypoxemia: Secondary | ICD-10-CM | POA: Diagnosis not present

## 2021-05-19 DIAGNOSIS — F0282 Dementia in other diseases classified elsewhere, unspecified severity, with psychotic disturbance: Secondary | ICD-10-CM | POA: Diagnosis present

## 2021-05-19 DIAGNOSIS — Z20822 Contact with and (suspected) exposure to covid-19: Secondary | ICD-10-CM | POA: Diagnosis present

## 2021-05-19 DIAGNOSIS — N186 End stage renal disease: Secondary | ICD-10-CM

## 2021-05-19 DIAGNOSIS — I69398 Other sequelae of cerebral infarction: Secondary | ICD-10-CM

## 2021-05-19 DIAGNOSIS — K633 Ulcer of intestine: Secondary | ICD-10-CM | POA: Diagnosis not present

## 2021-05-19 DIAGNOSIS — R1310 Dysphagia, unspecified: Secondary | ICD-10-CM

## 2021-05-19 DIAGNOSIS — M898X9 Other specified disorders of bone, unspecified site: Secondary | ICD-10-CM | POA: Diagnosis present

## 2021-05-19 DIAGNOSIS — R1312 Dysphagia, oropharyngeal phase: Secondary | ICD-10-CM

## 2021-05-19 DIAGNOSIS — G9349 Other encephalopathy: Secondary | ICD-10-CM | POA: Diagnosis present

## 2021-05-19 DIAGNOSIS — J189 Pneumonia, unspecified organism: Secondary | ICD-10-CM | POA: Diagnosis not present

## 2021-05-19 DIAGNOSIS — K921 Melena: Secondary | ICD-10-CM | POA: Diagnosis not present

## 2021-05-19 DIAGNOSIS — Z87891 Personal history of nicotine dependence: Secondary | ICD-10-CM

## 2021-05-19 DIAGNOSIS — Z781 Physical restraint status: Secondary | ICD-10-CM

## 2021-05-19 DIAGNOSIS — Z4659 Encounter for fitting and adjustment of other gastrointestinal appliance and device: Secondary | ICD-10-CM

## 2021-05-19 DIAGNOSIS — M542 Cervicalgia: Secondary | ICD-10-CM | POA: Diagnosis not present

## 2021-05-19 DIAGNOSIS — Z823 Family history of stroke: Secondary | ICD-10-CM

## 2021-05-19 DIAGNOSIS — I82611 Acute embolism and thrombosis of superficial veins of right upper extremity: Secondary | ICD-10-CM | POA: Diagnosis not present

## 2021-05-19 DIAGNOSIS — R9389 Abnormal findings on diagnostic imaging of other specified body structures: Secondary | ICD-10-CM

## 2021-05-19 DIAGNOSIS — Z7401 Bed confinement status: Secondary | ICD-10-CM

## 2021-05-19 DIAGNOSIS — Z992 Dependence on renal dialysis: Secondary | ICD-10-CM

## 2021-05-19 DIAGNOSIS — R0989 Other specified symptoms and signs involving the circulatory and respiratory systems: Secondary | ICD-10-CM

## 2021-05-19 DIAGNOSIS — R627 Adult failure to thrive: Secondary | ICD-10-CM | POA: Diagnosis present

## 2021-05-19 DIAGNOSIS — Y712 Prosthetic and other implants, materials and accessory cardiovascular devices associated with adverse incidents: Secondary | ICD-10-CM | POA: Diagnosis not present

## 2021-05-19 DIAGNOSIS — R000 Tachycardia, unspecified: Secondary | ICD-10-CM | POA: Diagnosis not present

## 2021-05-19 DIAGNOSIS — A419 Sepsis, unspecified organism: Secondary | ICD-10-CM | POA: Diagnosis not present

## 2021-05-19 DIAGNOSIS — I120 Hypertensive chronic kidney disease with stage 5 chronic kidney disease or end stage renal disease: Secondary | ICD-10-CM | POA: Diagnosis present

## 2021-05-19 DIAGNOSIS — Y95 Nosocomial condition: Secondary | ICD-10-CM | POA: Diagnosis not present

## 2021-05-19 DIAGNOSIS — I808 Phlebitis and thrombophlebitis of other sites: Secondary | ICD-10-CM | POA: Diagnosis not present

## 2021-05-19 DIAGNOSIS — R638 Other symptoms and signs concerning food and fluid intake: Secondary | ICD-10-CM

## 2021-05-19 DIAGNOSIS — Z7901 Long term (current) use of anticoagulants: Secondary | ICD-10-CM

## 2021-05-19 DIAGNOSIS — T782XXA Anaphylactic shock, unspecified, initial encounter: Secondary | ICD-10-CM | POA: Diagnosis present

## 2021-05-19 DIAGNOSIS — Z79891 Long term (current) use of opiate analgesic: Secondary | ICD-10-CM

## 2021-05-19 DIAGNOSIS — R063 Periodic breathing: Secondary | ICD-10-CM | POA: Diagnosis not present

## 2021-05-19 DIAGNOSIS — F0280 Dementia in other diseases classified elsewhere without behavioral disturbance: Secondary | ICD-10-CM | POA: Diagnosis present

## 2021-05-19 DIAGNOSIS — D631 Anemia in chronic kidney disease: Secondary | ICD-10-CM | POA: Diagnosis present

## 2021-05-19 DIAGNOSIS — E1122 Type 2 diabetes mellitus with diabetic chronic kidney disease: Secondary | ICD-10-CM

## 2021-05-19 DIAGNOSIS — K5909 Other constipation: Secondary | ICD-10-CM | POA: Diagnosis present

## 2021-05-19 DIAGNOSIS — K644 Residual hemorrhoidal skin tags: Secondary | ICD-10-CM | POA: Diagnosis present

## 2021-05-19 DIAGNOSIS — R4189 Other symptoms and signs involving cognitive functions and awareness: Secondary | ICD-10-CM | POA: Diagnosis present

## 2021-05-19 DIAGNOSIS — E785 Hyperlipidemia, unspecified: Secondary | ICD-10-CM | POA: Diagnosis present

## 2021-05-19 DIAGNOSIS — R509 Fever, unspecified: Secondary | ICD-10-CM

## 2021-05-19 DIAGNOSIS — I10 Essential (primary) hypertension: Secondary | ICD-10-CM | POA: Diagnosis present

## 2021-05-19 DIAGNOSIS — R7982 Elevated C-reactive protein (CRP): Secondary | ICD-10-CM | POA: Diagnosis not present

## 2021-05-19 DIAGNOSIS — K219 Gastro-esophageal reflux disease without esophagitis: Secondary | ICD-10-CM | POA: Diagnosis present

## 2021-05-19 DIAGNOSIS — I6330 Cerebral infarction due to thrombosis of unspecified cerebral artery: Secondary | ICD-10-CM | POA: Diagnosis not present

## 2021-05-19 DIAGNOSIS — D72829 Elevated white blood cell count, unspecified: Secondary | ICD-10-CM

## 2021-05-19 DIAGNOSIS — I639 Cerebral infarction, unspecified: Secondary | ICD-10-CM

## 2021-05-19 DIAGNOSIS — R54 Age-related physical debility: Secondary | ICD-10-CM | POA: Diagnosis present

## 2021-05-19 DIAGNOSIS — R0681 Apnea, not elsewhere classified: Secondary | ICD-10-CM | POA: Diagnosis not present

## 2021-05-19 DIAGNOSIS — F05 Delirium due to known physiological condition: Secondary | ICD-10-CM | POA: Diagnosis not present

## 2021-05-19 DIAGNOSIS — G9340 Encephalopathy, unspecified: Secondary | ICD-10-CM

## 2021-05-19 DIAGNOSIS — R414 Neurologic neglect syndrome: Secondary | ICD-10-CM | POA: Diagnosis present

## 2021-05-19 DIAGNOSIS — E1165 Type 2 diabetes mellitus with hyperglycemia: Secondary | ICD-10-CM | POA: Diagnosis not present

## 2021-05-19 DIAGNOSIS — Z515 Encounter for palliative care: Secondary | ICD-10-CM

## 2021-05-19 DIAGNOSIS — E1142 Type 2 diabetes mellitus with diabetic polyneuropathy: Secondary | ICD-10-CM | POA: Diagnosis present

## 2021-05-19 DIAGNOSIS — T82868A Thrombosis of vascular prosthetic devices, implants and grafts, initial encounter: Secondary | ICD-10-CM | POA: Diagnosis not present

## 2021-05-19 DIAGNOSIS — T85598A Other mechanical complication of other gastrointestinal prosthetic devices, implants and grafts, initial encounter: Secondary | ICD-10-CM

## 2021-05-19 DIAGNOSIS — G9341 Metabolic encephalopathy: Secondary | ICD-10-CM | POA: Diagnosis not present

## 2021-05-19 DIAGNOSIS — L98429 Non-pressure chronic ulcer of back with unspecified severity: Secondary | ICD-10-CM | POA: Diagnosis present

## 2021-05-19 DIAGNOSIS — Z79899 Other long term (current) drug therapy: Secondary | ICD-10-CM

## 2021-05-19 DIAGNOSIS — I953 Hypotension of hemodialysis: Secondary | ICD-10-CM | POA: Diagnosis not present

## 2021-05-19 DIAGNOSIS — A4189 Other specified sepsis: Secondary | ICD-10-CM

## 2021-05-19 DIAGNOSIS — Y832 Surgical operation with anastomosis, bypass or graft as the cause of abnormal reaction of the patient, or of later complication, without mention of misadventure at the time of the procedure: Secondary | ICD-10-CM | POA: Diagnosis not present

## 2021-05-19 DIAGNOSIS — Z66 Do not resuscitate: Secondary | ICD-10-CM | POA: Diagnosis not present

## 2021-05-19 DIAGNOSIS — D62 Acute posthemorrhagic anemia: Secondary | ICD-10-CM | POA: Diagnosis present

## 2021-05-19 DIAGNOSIS — R609 Edema, unspecified: Secondary | ICD-10-CM

## 2021-05-19 DIAGNOSIS — R471 Dysarthria and anarthria: Secondary | ICD-10-CM | POA: Diagnosis not present

## 2021-05-19 DIAGNOSIS — K625 Hemorrhage of anus and rectum: Secondary | ICD-10-CM | POA: Diagnosis present

## 2021-05-19 DIAGNOSIS — E876 Hypokalemia: Secondary | ICD-10-CM | POA: Diagnosis present

## 2021-05-19 DIAGNOSIS — T40605A Adverse effect of unspecified narcotics, initial encounter: Secondary | ICD-10-CM | POA: Diagnosis not present

## 2021-05-19 DIAGNOSIS — M24532 Contracture, left wrist: Secondary | ICD-10-CM | POA: Diagnosis present

## 2021-05-19 DIAGNOSIS — Z681 Body mass index (BMI) 19 or less, adult: Secondary | ICD-10-CM

## 2021-05-19 DIAGNOSIS — E43 Unspecified severe protein-calorie malnutrition: Secondary | ICD-10-CM | POA: Diagnosis present

## 2021-05-19 LAB — RESP PANEL BY RT-PCR (FLU A&B, COVID) ARPGX2
Influenza A by PCR: NEGATIVE
Influenza B by PCR: NEGATIVE
SARS Coronavirus 2 by RT PCR: NEGATIVE

## 2021-05-19 LAB — CBC
HCT: 28.2 % — ABNORMAL LOW (ref 36.0–46.0)
Hemoglobin: 9 g/dL — ABNORMAL LOW (ref 12.0–15.0)
MCH: 30.9 pg (ref 26.0–34.0)
MCHC: 31.9 g/dL (ref 30.0–36.0)
MCV: 96.9 fL (ref 80.0–100.0)
Platelets: 122 10*3/uL — ABNORMAL LOW (ref 150–400)
RBC: 2.91 MIL/uL — ABNORMAL LOW (ref 3.87–5.11)
RDW: 19.1 % — ABNORMAL HIGH (ref 11.5–15.5)
WBC: 9 10*3/uL (ref 4.0–10.5)
nRBC: 0 % (ref 0.0–0.2)

## 2021-05-19 LAB — COMPREHENSIVE METABOLIC PANEL
ALT: 20 U/L (ref 0–44)
AST: 57 U/L — ABNORMAL HIGH (ref 15–41)
Albumin: 2.4 g/dL — ABNORMAL LOW (ref 3.5–5.0)
Alkaline Phosphatase: 76 U/L (ref 38–126)
Anion gap: 11 (ref 5–15)
BUN: 18 mg/dL (ref 8–23)
CO2: 31 mmol/L (ref 22–32)
Calcium: 7.6 mg/dL — ABNORMAL LOW (ref 8.9–10.3)
Chloride: 90 mmol/L — ABNORMAL LOW (ref 98–111)
Creatinine, Ser: 5.3 mg/dL — ABNORMAL HIGH (ref 0.44–1.00)
GFR, Estimated: 8 mL/min — ABNORMAL LOW (ref >60)
Glucose, Bld: 211 mg/dL — ABNORMAL HIGH (ref 70–99)
Potassium: 2.8 mmol/L — ABNORMAL LOW (ref 3.5–5.1)
Sodium: 132 mmol/L — ABNORMAL LOW (ref 135–145)
Total Bilirubin: 0.9 mg/dL (ref 0.3–1.2)
Total Protein: 5.9 g/dL — ABNORMAL LOW (ref 6.5–8.1)

## 2021-05-19 LAB — TYPE AND SCREEN
ABO/RH(D): O POS
Antibody Screen: NEGATIVE

## 2021-05-19 LAB — CBC WITH DIFFERENTIAL/PLATELET
Abs Immature Granulocytes: 0.08 10*3/uL — ABNORMAL HIGH (ref 0.00–0.07)
Basophils Absolute: 0 10*3/uL (ref 0.0–0.1)
Basophils Relative: 0 %
Eosinophils Absolute: 0.3 10*3/uL (ref 0.0–0.5)
Eosinophils Relative: 3 %
HCT: 29.6 % — ABNORMAL LOW (ref 36.0–46.0)
Hemoglobin: 9.6 g/dL — ABNORMAL LOW (ref 12.0–15.0)
Immature Granulocytes: 1 %
Lymphocytes Relative: 13 %
Lymphs Abs: 1.4 10*3/uL (ref 0.7–4.0)
MCH: 31.6 pg (ref 26.0–34.0)
MCHC: 32.4 g/dL (ref 30.0–36.0)
MCV: 97.4 fL (ref 80.0–100.0)
Monocytes Absolute: 1.2 10*3/uL — ABNORMAL HIGH (ref 0.1–1.0)
Monocytes Relative: 11 %
Neutro Abs: 7.5 10*3/uL (ref 1.7–7.7)
Neutrophils Relative %: 72 %
Platelets: 145 10*3/uL — ABNORMAL LOW (ref 150–400)
RBC: 3.04 MIL/uL — ABNORMAL LOW (ref 3.87–5.11)
RDW: 19.6 % — ABNORMAL HIGH (ref 11.5–15.5)
WBC: 10.5 10*3/uL (ref 4.0–10.5)
nRBC: 0 % (ref 0.0–0.2)

## 2021-05-19 LAB — MAGNESIUM: Magnesium: 1.9 mg/dL (ref 1.7–2.4)

## 2021-05-19 LAB — POC OCCULT BLOOD, ED: Fecal Occult Bld: POSITIVE — AB

## 2021-05-19 MED ORDER — ALTEPLASE 2 MG IJ SOLR: 2.0000 mg | Freq: Once | INTRAMUSCULAR | Status: DC | PRN

## 2021-05-19 MED ORDER — POTASSIUM CHLORIDE 10 MEQ/100ML IV SOLN
10.0000 meq | INTRAVENOUS | Status: AC
  Administered 2021-05-19 (×2): 10 meq via INTRAVENOUS
  Filled 2021-05-19: qty 100

## 2021-05-19 MED ORDER — SODIUM CHLORIDE 0.9% FLUSH
3.0000 mL | Freq: Two times a day (BID) | INTRAVENOUS | Status: DC
  Administered 2021-05-22 – 2021-07-12 (×71): 3 mL via INTRAVENOUS

## 2021-05-19 MED ORDER — LIDOCAINE HCL (PF) 1 % IJ SOLN: 5.0000 mL | INTRAMUSCULAR | Status: DC | PRN

## 2021-05-19 MED ORDER — PANTOPRAZOLE SODIUM 40 MG IV SOLR
40.0000 mg | Freq: Once | INTRAVENOUS | Status: AC
  Administered 2021-05-19: 40 mg via INTRAVENOUS
  Filled 2021-05-19: qty 40

## 2021-05-19 MED ORDER — PENTAFLUOROPROP-TETRAFLUOROETH EX AERO
1.0000 | INHALATION_SPRAY | CUTANEOUS | Status: DC | PRN
Start: 2021-05-20 — End: 2021-05-20
  Filled 2021-05-19: qty 116

## 2021-05-19 MED ORDER — POTASSIUM CHLORIDE 10 MEQ/100ML IV SOLN
10.0000 meq | INTRAVENOUS | Status: AC
  Administered 2021-05-19 (×2): 10 meq via INTRAVENOUS

## 2021-05-19 MED ORDER — SODIUM CHLORIDE 0.9 % IV SOLN: 100.0000 mL | INTRAVENOUS | Status: DC | PRN

## 2021-05-19 MED ORDER — SODIUM CHLORIDE 0.9% FLUSH
3.0000 mL | INTRAVENOUS | Status: DC | PRN
  Administered 2021-07-09: 3 mL via INTRAVENOUS

## 2021-05-19 MED ORDER — LIDOCAINE-PRILOCAINE 2.5-2.5 % EX CREA
1.0000 "application " | TOPICAL_CREAM | CUTANEOUS | Status: DC | PRN
  Filled 2021-05-19: qty 5

## 2021-05-19 MED ORDER — HEPARIN SODIUM (PORCINE) 1000 UNIT/ML DIALYSIS
1000.0000 [IU] | INTRAMUSCULAR | Status: DC | PRN
Start: 2021-05-20 — End: 2021-05-20
  Filled 2021-05-19: qty 1

## 2021-05-19 MED ORDER — PANTOPRAZOLE INFUSION (NEW) - SIMPLE MED
8.0000 mg/h | INTRAVENOUS | Status: AC
  Administered 2021-05-19 – 2021-05-21 (×6): 8 mg/h via INTRAVENOUS
  Filled 2021-05-19: qty 100
  Filled 2021-05-19 (×5): qty 80
  Filled 2021-05-19 (×2): qty 100

## 2021-05-19 MED ORDER — SODIUM CHLORIDE 0.9 % IV SOLN: 250.0000 mL | INTRAVENOUS | Status: DC | PRN

## 2021-05-19 NOTE — ED Provider Notes (Signed)
[facility] EMERGENCY DEPARTMENT Provider Note   CSN: [id number] Arrival date & time: 05/19/21  [id number]     History Chief Complaint  Patient presents with   Rectal Bleeding    Amy [name] is a 75 y.o. female with PMH/o CKD, HLD, HTN who presents for evaluation of rectal bleeding.  Per nursing home, last night, patient had an episode of bowel movement that looked dark.  They tested it and it was positive for occult blood.  They were going to do labs this morning but then this morning, she had another episode which showed more blood, prompting ED visit.  They report the patient is at baseline.  She is currently on Eliquis.  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  EM LEVEL 5 CAVEAT DUE TO H/O CONFUSION secondary to CVA  The history is provided by the patient.      Past Medical History:  Diagnosis Date   Chronic kidney disease    dialysis M-W-F - dialysis cath located in right side of chest   Diabetes mellitus without complication ([place])    type 2 - no meds    HLD (hyperlipidemia)    diet controlled   Hypertension    no meds    Patient Active Problem List   Diagnosis Date Noted   Hypokalemia 05/19/2021   GI bleed 05/19/2021   Stage 2 skin ulcer of sacral region ([place]) 05/19/2021   CVA (cerebral vascular accident) ([place]) 04/21/2021   Lower GI bleed 04/21/2021   Gastritis 03/11/2021   Elevated glucose 03/11/2021   Hospital discharge follow-up 02/06/2021   Swelling of face 01/27/2021   Facial swelling 01/27/2021   Neck swelling 01/10/2021   Dyslipidemia 01/09/2021   Angio-edema 01/08/2021   Primary osteoarthritis of right knee 12/10/2020   Diarrhea, unspecified 07/03/2020   Pain, unspecified 07/03/2020   Unspecified disturbances of skin sensation 07/03/2020   Type 2 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, with long-term current use of insulin ([place]) 05/23/2020   Slow transit constipation  05/09/2020   Drug-induced constipation 05/09/2020   Acidosis 05/01/2020   Disorder of lipoprotein metabolism, 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   Encounter for adequacy testing for peritoneal dialysis ([place]) 05/01/2020   Liver disease, 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   Other dietary vitamin B12 deficiency anemia 05/01/2020   Other disorders of electrolyte and fluid balance, not elsewhere classified 05/01/2020   Other disorders resulting from impaired renal tubular function 05/01/2020   Other long term (current) drug therapy 05/01/2020   Essential hypertension 01/16/2020   Bilateral carpal tunnel syndrome 01/16/2020   Abnormality of albumin 12/07/2019   Allergy, unspecified, initial encounter 11/27/2019   Mild protein-calorie malnutrition ([place]) 09/25/2019   Iron deficiency anemia, unspecified 09/22/2019   Coagulation defect, unspecified ([place]) 09/19/2019   Dependence on renal dialysis ([place]) 09/19/2019   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   Shortness of breath 09/19/2019   Hyperkalemia 07/20/2019   Hypocalcemia [id number]   Metabolic acidosis, increased anion gap 07/20/2019   Nausea and vomiting 07/20/2019   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/11/2017   Diabetic neuropathy ([place]) 04/15/2016   Hematuria 04/15/2016   Proteinuria due to type 2 diabetes mellitus ([place]) 04/15/2016   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 01/29/2016    Past Surgical History:  Procedure Laterality Date   A/V FISTULAGRAM Left 01/10/2021   Procedure: A/V FISTULAGRAM;  Surgeon: Angelia Mould, MD;  Location: [facility];  Service: Cardiovascular;  Laterality: Left;  A/V FISTULAGRAM N/A 01/27/2021   Procedure: A/V FISTULAGRAM;  Surgeon: Waynetta Sandy, MD;  Location: [facility];  Service: Cardiovascular;  Laterality: N/A;   AV FISTULA PLACEMENT Left 09/24/2020   Procedure: LEFT UPPER ARM ARTERIOVENOUS GORTEX  GRAFT;  Surgeon: Elam Dutch, MD;  Location: [facility];  Service:  Vascular;  Laterality: Left;   BREAST CYST EXCISION Right 2011   cyst removed -neg   EYE SURGERY Bilateral    cataracts   INSERTION OF DIALYSIS CATHETER Right 07/2019   PERIPHERAL VASCULAR BALLOON ANGIOPLASTY  01/10/2021   Procedure: PERIPHERAL VASCULAR BALLOON ANGIOPLASTY;  Surgeon: Angelia Mould, MD;  Location: [facility];  Service: Cardiovascular;;  left AV graft   TUBAL LIGATION     Ultrasound-guided cannulation left upper arm AV graft  01/27/2021     OB History   No obstetric history on file.     Family History  Problem Relation Age of Onset   Breast cancer Neg Hx     Social History   Tobacco Use   Smoking status: Former   Smokeless tobacco: Never  Scientific laboratory technician Use: Never used  Substance Use Topics   Alcohol use: Yes    Comment: occasional   Drug use: Not Currently    Types: Marijuana    Comment: Last use 2020    Home Medications Prior to Admission medications   Medication Sig Start Date End Date Taking? Authorizing Provider  acetaminophen (TYLENOL) 500 MG tablet Take 1,000 mg by mouth every 6 (six) hours as needed for mild pain.   Yes [provider]  apixaban (ELIQUIS) 5 MG TABS tablet Take 5 mg by mouth 2 (two) times daily.   Yes [provider]  atorvastatin (LIPITOR) 40 MG tablet Take 40 mg by mouth daily.   Yes [provider]  dibucaine (NUPERCAINAL) 1 % OINT Place 1 application rectally every 2 (two) hours as needed (pain).   Yes [provider]  diclofenac Sodium (VOLTAREN) 1 % GEL Apply 2 g topically 4 (four) times daily.   Yes [provider]  FLUoxetine (PROZAC) 40 MG capsule Take 40 mg by mouth daily.   Yes [provider]  gabapentin (NEURONTIN) 100 MG capsule Take 100 mg by mouth at bedtime.   Yes [provider]  hydrocortisone 1 % ointment Apply 1 application topically 2 (two) times daily. Left side of neck   Yes [provider]  lactulose (CHRONULAC) 10  GM/15ML solution Take 60 mLs (40 g total) by mouth daily as needed for mild constipation. Patient taking differently: Take 27 g by mouth daily as needed for mild constipation. 03/11/21  Yes [name], MD  lidocaine (LIDODERM) 5 % Place 1 patch onto the skin See admin instructions. Apply to right neck in the morning and remove at bedtime   Yes [provider]  lidocaine-prilocaine (EMLA) cream Apply 1 application topically every Monday, Wednesday, and Friday. 1-2  Hours prior to dialysis 01/17/21  Yes [provider]  midodrine (PROAMATINE) 10 MG tablet Take 10 mg by mouth in the morning and at bedtime.   Yes [provider]  mirtazapine (REMERON) 7.5 MG tablet Take 7.5 mg by mouth at bedtime.   Yes [provider]  multivitamin (RENA-VIT) TABS tablet Take 1 tablet by mouth daily.   Yes [provider]  Nutritional Supplements (ENSURE CLEAR PO) Take 237 mLs by mouth in the morning, at noon, and at bedtime.  Yes [provider]  ondansetron (ZOFRAN) 4 MG tablet Take 4 mg by mouth every 8 (eight) hours as needed for vomiting or nausea. 03/03/21  Yes [provider]  ondansetron (ZOFRAN) 4 MG tablet Take 4 mg by mouth every 6 (six) hours as needed for nausea or vomiting.   Yes [provider]  oxyCODONE (OXY IR/ROXICODONE) 5 MG immediate release tablet Take 5 mg by mouth every 12 (twelve) hours.   Yes [provider]  pantoprazole (PROTONIX) 20 MG tablet Take 1 tablet (20 mg total) by mouth daily. 03/11/21  Yes [name], MD  venlafaxine XR (EFFEXOR-XR) 75 MG 24 hr capsule Take 75 mg by mouth daily. 02/03/21  Yes [provider]  acetaminophen (TYLENOL) 325 MG tablet Take 650 mg by mouth every 6 (six) hours as needed for moderate pain or headache. Patient not taking: Reported on 05/02/2021    [provider]  calcium acetate (PHOSLO) 667 MG capsule Take 1,334 mg by mouth 3 (three) times  daily with meals. 01/02/20   [provider]  Etelcalcetide HCl (PARSABIV IV) Etelcalcetide Hermina Staggers) Patient not taking: Reported on 05/02/2021 02/17/21 01/22/22  [provider]  ethyl chloride spray SPRAY 1 SMALL AMOUT ONTO THE SKIN 3 TIMES A WEEK AS NEEDED Patient not taking: Reported on 05/02/2021 12/17/20   [provider]  Nutritional Supplements (NEPRO PO) Take 237 mLs by mouth in the morning and at bedtime.    [provider]  nystatin cream (MYCOSTATIN) Apply 1 application topically See admin instructions. Bid x 14 days for yeast--apply to groin    [provider]  nystatin cream (MYCOSTATIN) Apply 1 application topically See admin instructions. Apply to groin as needed x 14 days for yeast    [provider]    Allergies    Sensipar [cinacalcet] and Statins  Review of Systems   Review of Systems  Unable to perform ROS: Dementia   Physical Exam Updated Vital Signs BP (!) 132/95   Pulse 97   Temp 98.7 F (37.1 C) (Oral)   Resp 19   LMP  (LMP Unknown)   SpO2 94%   Physical Exam Vitals and nursing note reviewed. Exam conducted with a chaperone present.  Constitutional:      Appearance: Normal appearance. She is well-developed.  HENT:     Head: Normocephalic and atraumatic.  Eyes:     General: Lids are normal.     Conjunctiva/sclera: Conjunctivae normal.     Pupils: Pupils are equal, round, and reactive to light.  Cardiovascular:     Rate and Rhythm: Normal rate and regular rhythm.     Pulses: Normal pulses.     Heart sounds: Normal heart sounds. No murmur heard.   No friction rub. No gallop.  Pulmonary:     Effort: Pulmonary effort is normal.     Breath sounds: Normal breath sounds.     Comments: Lungs clear to auscultation bilaterally.  Symmetric chest rise.  No wheezing, rales, rhonchi. Abdominal:     Palpations: Abdomen is soft. Abdomen is not rigid.     Tenderness: There is no abdominal tenderness. There is no  guarding.     Comments: Abdomen is soft, non-distended, non-tender. No rigidity, No guarding. No peritoneal signs.  Genitourinary:    Comments: The exam was performed with a chaperone present Josephina Gip, [employer]).  Gross melena noted.  She has a small nonthrombosed external hemorrhoid at approximately the 11:00 region. Musculoskeletal:  General: Normal range of motion.     Cervical back: Full passive range of motion without pain.  Skin:    General: Skin is warm and dry.     Capillary Refill: Capillary refill takes less than 2 seconds.  Neurological:     Mental Status: She is alert.     Comments: Left sided hemiparesis from previous CVA  Psychiatric:        Speech: Speech normal.      ED Results / Procedures / Treatments   Labs (all labs ordered are listed, but only abnormal results are displayed) Labs Reviewed  COMPREHENSIVE METABOLIC PANEL - Abnormal; Notable for the following components:      Result Value   Sodium 132 (*)    Potassium 2.8 (*)    Chloride 90 (*)    Glucose, Bld 211 (*)    Creatinine, Ser 5.30 (*)    Calcium 7.6 (*)    Total Protein 5.9 (*)    Albumin 2.4 (*)    AST 57 (*)    GFR, Estimated 8 (*)    All other components within normal limits  CBC WITH DIFFERENTIAL/PLATELET - Abnormal; Notable for the following components:   RBC 3.04 (*)    Hemoglobin 9.6 (*)    HCT 29.6 (*)    RDW 19.6 (*)    Platelets 145 (*)    Monocytes Absolute 1.2 (*)    Abs Immature Granulocytes 0.08 (*)    All other components within normal limits  POC OCCULT BLOOD, ED - Abnormal; Notable for the following components:   Fecal Occult Bld POSITIVE (*)    All other components within normal limits  RESP PANEL BY RT-PCR (FLU A&B, COVID) ARPGX2  MAGNESIUM  CBC  TYPE AND SCREEN    EKG None  Radiology No results found.  Procedures Procedures   Medications Ordered in ED Medications  pantoprozole (PROTONIX) 80 mg /NS 100 mL infusion (8 mg/hr Intravenous New Bag/Given  05/19/21 1234)  potassium chloride 10 mEq in 100 mL IVPB (10 mEq Intravenous New Bag/Given 05/19/21 1425)  sodium chloride flush (NS) 0.9 % injection 3 mL (has no administration in time range)  sodium chloride flush (NS) 0.9 % injection 3 mL (has no administration in time range)  0.9 %  sodium chloride infusion (has no administration in time range)  pantoprazole (PROTONIX) injection 40 mg (40 mg Intravenous Given 05/19/21 1052)    ED Course  I have reviewed the triage vital signs and the nursing notes.  Pertinent labs & imaging results that were available during my care of the patient were reviewed by me and considered in my medical decision making (see chart for details).    MDM Rules/Calculators/A&P                           75 year old female who presents for evaluation of rectal bleeding.  She has had 2 episodes of bloody bowel movements since yesterday.  She is on Eliquis.  On initial arrival, she is afebrile, nontoxic-appearing.  Vital signs are stable.  Abdominal exam is benign.  On rectal exam, she is grossly positive for melena.  We will plan for labs, Protonix.  I discussed with Sze at [facility] care center.  They report that patient has history of dementia/confusion after CVA and that this she is at baseline.  She has had 2 total episodes of blood with bowel movements.  Today was slightly worse.  No vomiting.  She is on Eliquis.  I discussed with her son [name]).  He reports that patient had an episode of bleeding in [facility] a few months ago.  They thought it was because she was using an enema.  They did state it resolved and she has not had any more issues since being at the nursing facility.  He does correlate that patient is on Eliquis.  He does not know when she last had a colonoscopy.  She is fecal occult positive here.  CBC shows hemoglobin 9.6.  I reviewed her records.  She has had fluctuating hemoglobins.  About 2 months ago, she was on on 11 and 2 weeks ago, she had  dropped down to 9.8.  She consistently ranges between 10 and 11.  CMP today shows a potassium of 2.8, BUN of 18, creatinine of 5.30.  Discussed with Azucena Freed ([facility]).  They will plan to see the patient in consultation.  Discussed patient with Dr. Rogers Blocker (hospitalist) who accepts patient for admission.   Attempted to call patient's son to update on plan but unable to get a hold of him.   Portions of this note were generated with [employer]obby[employer]. Dictation errors may occur despite best attempts at proofreading.   Final Clinical Impression(s) / ED Diagnoses Final diagnoses:  Acute GI bleeding    Rx / DC Orders ED Discharge Orders     None        Desma Mcgregor 05/19/21 1459    Valarie Merino, MD 05/22/21 1409

## 2021-05-19 NOTE — ED Notes (Signed)
Pt son present for to sign consent.  States pt appears more swollen with an acute decrease in mental status over the last 2 days.

## 2021-05-19 NOTE — ED Notes (Signed)
GI consult at bedside.

## 2021-05-19 NOTE — Consult Note (Addendum)
Referring Provider:  [facility]         Primary Care Physician:  [name], MD Primary Gastroenterologist:  Althia Forts           We were asked to see this patient for:    GI bleed            ASSESSMENT / PLAN:   # 75 yo female with multiple medical problems admitted today with blood in stool on Eliquis.   Of note she had proctocolitis in May at [facility]. She had associated anemia requiring blood transfusion. C-diff was negative. Flexible sigmoidoscopy on 6/1 revealing multple rectal ulcers ( stercoral ulcers? ) but current bleed seems more compatible with upper GI bleed source dried blood in her mouth and melena on EDP's exam. --Will probably need an EGD. However she dialyzes M/W/F so not sure if she will have a session today or tomorrow. We need Eliquis to washout ( last dose yesterday) --Will need to discuss EGD with  a family member or HCPOA, patient cannot consent herself.  --IV PPI --Eliquis is on hold  ADDENDUM: I SPOKE WITH [name] BRIAN ABOUT AN EGD. WE DISCUSSED RISK / BENEFITS OF THE PROCEDURE AND HE AGREES FOR [place] TO PROCEED. HE WILL SIGN CONSENT WHEN HE VISITS HER THIS EVENING   # Anemia. Hgb 9.6.  Baseline seems around 10 -11. However due to the lower GI bleed in May ( during [facility] admission) her hgb fell into 7 range requiring transfusion.   --Transfuse if needed.   # Hypokalemia, repletion in progress   # Recent CVA ( during May 2022 admission to [facility]).   # Retropharyngeal fluid collection on CT scan during May [facility] admission . Follow up CT soft tissue of neck on 7/7 showed retroopharyngeal effusion similar to previous scan dating back to march 2022. No abscess or drainable fluid collection was seen  # Abnormal MBSS( May 2022 [facility] admission) . Prominent smoothly marginated impression on posterior wall of esophagus in C5 verterbral region.     HPI:                                                                                                                               Chief Complaint:  none from patient  EMRI CADD is a 75 y.o. female with a past medical history significant for DM, HTN, HL, ESRD on HD M/W/F, CVA, blindness .   Patient brought to ED today from [facility] (where she resides) for evaluation of blood in stool. Patient unable to give history. She exhibits confusion and speech difficult to understand at times. Per ED notes, SNF saw blood in patient's stool yesterday and again today.  EDP found gross melena (heme +) on rectal exam. In ED her hgb is 9.6, same as it was on 7/7 ( baseline hgb 10-11). Patient takes Eliquis, last dose was yesterday.   Reviewed records in [place] from prolonged May  2022 admission rectal bleeding ( rectal ulcers), CVA, retropharyngeal fluid collection, nausea / vomiting possibly secondary to uremia, fr    PREVIOUS ENDOSCOPIC EVALUATIONS / PERTINENT STUDIES   03/26/21 Flexible sigmoidoscopy at [facility] 03/26/21 Multiple ulcers in rectum.  Diagnosis    A: Rectum, biopsy - Polypoid fragment of colorectal mucosa with crypt architectural disorder including slight hyperplastic features, mild fibromuscular hyperplasia of the lamina propria, and adherent necroinflammatory debris suggestive of ulcer exudate - No CMV viral cytopathic effect, granuloma, or dysplasia identified - See comment       Past Medical History:  Diagnosis Date   Chronic kidney disease    dialysis M-W-F - dialysis cath located in right side of chest   Diabetes mellitus without complication ([place])    type 2 - no meds    HLD (hyperlipidemia)    diet controlled   Hypertension    no meds    Past Surgical History:  Procedure Laterality Date   A/V FISTULAGRAM Left 01/10/2021   Procedure: A/V FISTULAGRAM;  Surgeon: Angelia Mould, MD;  Location: [place] [facility];  Service: Cardiovascular;  Laterality: Left;   A/V FISTULAGRAM N/A 01/27/2021   Procedure: A/V FISTULAGRAM;  Surgeon: Waynetta Sandy,  MD;  Location: [place] [facility];  Service: Cardiovascular;  Laterality: N/A;   AV FISTULA PLACEMENT Left 09/24/2020   Procedure: LEFT UPPER ARM ARTERIOVENOUS GORTEX  GRAFT;  Surgeon: Elam Dutch, MD;  Location: [place];  Service: Vascular;  Laterality: Left;   BREAST CYST EXCISION Right 2011   cyst removed -neg   EYE SURGERY Bilateral    cataracts   INSERTION OF DIALYSIS CATHETER Right 07/2019   PERIPHERAL VASCULAR BALLOON ANGIOPLASTY  01/10/2021   Procedure: PERIPHERAL VASCULAR BALLOON ANGIOPLASTY;  Surgeon: Angelia Mould, MD;  Location: [place] [facility];  Service: Cardiovascular;;  left AV graft   TUBAL LIGATION     Ultrasound-guided cannulation left upper arm AV graft  01/27/2021    Prior to Admission medications   Medication Sig Start Date End Date Taking? Authorizing Provider  acetaminophen (TYLENOL) 500 MG tablet Take 1,000 mg by mouth every 6 (six) hours as needed for mild pain.   Yes [provider]  apixaban (ELIQUIS) 5 MG TABS tablet Take 5 mg by mouth 2 (two) times daily.   Yes [provider]  atorvastatin (LIPITOR) 40 MG tablet Take 40 mg by mouth daily.   Yes [provider]  dibucaine (NUPERCAINAL) 1 % OINT Place 1 application rectally every 2 (two) hours as needed (pain).   Yes [provider]  diclofenac Sodium (VOLTAREN) 1 % GEL Apply 2 g topically 4 (four) times daily.   Yes [provider]  FLUoxetine (PROZAC) 40 MG capsule Take 40 mg by mouth daily.   Yes [provider]  gabapentin (NEURONTIN) 100 MG capsule Take 100 mg by mouth at bedtime.   Yes [provider]  hydrocortisone 1 % ointment Apply 1 application topically 2 (two) times daily. Left side of neck   Yes [provider]  lactulose (CHRONULAC) 10 GM/15ML solution Take 60 mLs (40 g total) by mouth daily as needed for mild constipation. Patient taking differently: Take 27 g by mouth daily as needed for mild constipation.  03/11/21  Yes [name], MD  lidocaine (LIDODERM) 5 % Place 1 patch onto the skin See admin instructions. Apply to right neck in the morning and remove at bedtime   Yes [provider]  lidocaine-prilocaine (EMLA)  cream Apply 1 application topically every Monday, Wednesday, and Friday. 1-2  Hours prior to dialysis 01/17/21  Yes [provider]  midodrine (PROAMATINE) 10 MG tablet Take 10 mg by mouth in the morning and at bedtime.   Yes [provider]  mirtazapine (REMERON) 7.5 MG tablet Take 7.5 mg by mouth at bedtime.   Yes [provider]  multivitamin (RENA-VIT) TABS tablet Take 1 tablet by mouth daily.   Yes [provider]  Nutritional Supplements (ENSURE CLEAR PO) Take 237 mLs by mouth in the morning, at noon, and at bedtime.   Yes [provider]  ondansetron (ZOFRAN) 4 MG tablet Take 4 mg by mouth every 8 (eight) hours as needed for vomiting or nausea. 03/03/21  Yes [provider]  ondansetron (ZOFRAN) 4 MG tablet Take 4 mg by mouth every 6 (six) hours as needed for nausea or vomiting.   Yes [provider]  oxyCODONE (OXY IR/ROXICODONE) 5 MG immediate release tablet Take 5 mg by mouth every 12 (twelve) hours.   Yes [provider]  pantoprazole (PROTONIX) 20 MG tablet Take 1 tablet (20 mg total) by mouth daily. 03/11/21  Yes [name], MD  venlafaxine XR (EFFEXOR-XR) 75 MG 24 hr capsule Take 75 mg by mouth daily. 02/03/21  Yes [provider]  acetaminophen (TYLENOL) 325 MG tablet Take 650 mg by mouth every 6 (six) hours as needed for moderate pain or headache. Patient not taking: Reported on 05/02/2021    [provider]  calcium acetate (PHOSLO) 667 MG capsule Take 1,334 mg by mouth 3 (three) times daily with meals. 01/02/20   [provider]  Etelcalcetide HCl (PARSABIV IV) Etelcalcetide Hermina Staggers) Patient not taking: Reported on 05/02/2021 02/17/21 01/22/22  [provider]  ethyl chloride spray SPRAY 1 SMALL AMOUT ONTO THE SKIN 3 TIMES A WEEK AS NEEDED Patient not taking: Reported on 05/02/2021 12/17/20   [provider]  Nutritional Supplements (NEPRO PO) Take 237 mLs by mouth in the morning and at bedtime.    [provider]  nystatin cream (MYCOSTATIN) Apply 1 application topically See admin instructions. Bid x 14 days for yeast--apply to groin    [provider]  nystatin cream (MYCOSTATIN) Apply 1 application topically See admin instructions. Apply to groin as needed x 14 days for yeast    [provider]    Current Facility-Administered Medications  Medication Dose Route Frequency Provider Last Rate Last Admin   0.9 %  sodium chloride infusion  250 mL Intravenous PRN Orma Flaming, MD       pantoprozole (PROTONIX) 80 mg /NS 100 mL infusion  8 mg/hr Intravenous Continuous Providence Lanius A, PA-C 10 mL/hr at 05/19/21 1234 8 mg/hr at 05/19/21 1234   potassium chloride 10 mEq in 100 mL IVPB  10 mEq Intravenous Q1 Hr x 3 Orma Flaming, MD 100 mL/hr at 05/19/21 1425 10 mEq at 05/19/21 1425   sodium chloride flush (NS) 0.9 % injection 3 mL  3 mL Intravenous Q12H Orma Flaming, MD       sodium chloride flush (NS) 0.9 % injection 3 mL  3 mL Intravenous PRN Orma Flaming, MD       Current Outpatient Medications  Medication Sig Dispense Refill   acetaminophen (TYLENOL) 500 MG tablet Take 1,000 mg by mouth every 6 (six) hours as needed for mild pain.     apixaban (ELIQUIS) 5 MG TABS tablet Take 5 mg by mouth 2 (two) times daily.  atorvastatin (LIPITOR) 40 MG tablet Take 40 mg by mouth daily.     dibucaine (NUPERCAINAL) 1 % OINT Place 1 application rectally every 2 (two) hours as needed (pain).     diclofenac Sodium (VOLTAREN) 1 % GEL Apply 2 g topically 4 (four) times daily.     FLUoxetine (PROZAC) 40 MG capsule Take 40 mg by mouth daily.     gabapentin (NEURONTIN) 100 MG capsule Take 100 mg by mouth at bedtime.      hydrocortisone 1 % ointment Apply 1 application topically 2 (two) times daily. Left side of neck     lactulose (CHRONULAC) 10 GM/15ML solution Take 60 mLs (40 g total) by mouth daily as needed for mild constipation. (Patient taking differently: Take 27 g by mouth daily as needed for mild constipation.) 236 mL 2   lidocaine (LIDODERM) 5 % Place 1 patch onto the skin See admin instructions. Apply to right neck in the morning and remove at bedtime     lidocaine-prilocaine (EMLA) cream Apply 1 application topically every Monday, Wednesday, and Friday. 1-2  Hours prior to dialysis     midodrine (PROAMATINE) 10 MG tablet Take 10 mg by mouth in the morning and at bedtime.     mirtazapine (REMERON) 7.5 MG tablet Take 7.5 mg by mouth at bedtime.     multivitamin (RENA-VIT) TABS tablet Take 1 tablet by mouth daily.     Nutritional Supplements (ENSURE CLEAR PO) Take 237 mLs by mouth in the morning, at noon, and at bedtime.     ondansetron (ZOFRAN) 4 MG tablet Take 4 mg by mouth every 8 (eight) hours as needed for vomiting or nausea.     ondansetron (ZOFRAN) 4 MG tablet Take 4 mg by mouth every 6 (six) hours as needed for nausea or vomiting.     oxyCODONE (OXY IR/ROXICODONE) 5 MG immediate release tablet Take 5 mg by mouth every 12 (twelve) hours.     pantoprazole (PROTONIX) 20 MG tablet Take 1 tablet (20 mg total) by mouth daily. 30 tablet 0   venlafaxine XR (EFFEXOR-XR) 75 MG 24 hr capsule Take 75 mg by mouth daily.     acetaminophen (TYLENOL) 325 MG tablet Take 650 mg by mouth every 6 (six) hours as needed for moderate pain or headache. (Patient not taking: Reported on 05/02/2021)     calcium acetate (PHOSLO) 667 MG capsule Take 1,334 mg by mouth 3 (three) times daily with meals.     Etelcalcetide HCl (PARSABIV IV) Etelcalcetide Hermina Staggers) (Patient not taking: Reported on 05/02/2021)     ethyl chloride spray SPRAY 1 SMALL AMOUT ONTO THE SKIN 3 TIMES A WEEK AS NEEDED (Patient not taking: Reported on 05/02/2021)      Nutritional Supplements (NEPRO PO) Take 237 mLs by mouth in the morning and at bedtime.     nystatin cream (MYCOSTATIN) Apply 1 application topically See admin instructions. Bid x 14 days for yeast--apply to groin     nystatin cream (MYCOSTATIN) Apply 1 application topically See admin instructions. Apply to groin as needed x 14 days for yeast      Allergies as of 05/19/2021 - Review Complete 05/19/2021  Allergen Reaction Noted   Sensipar [cinacalcet]  05/09/2020   Statins  05/02/2021    Family History  Problem Relation Age of Onset   Breast cancer Neg Hx     Social History   Socioeconomic History   Marital status: Single    Spouse name: Not on file   Number of children:  Not on file   Years of education: Not on file   Highest education level: Not on file  Occupational History   Not on file  Tobacco Use   Smoking status: Former   Smokeless tobacco: Never  Vaping Use   Vaping Use: Never used  Substance and Sexual Activity   Alcohol use: Yes    Comment: occasional   Drug use: Not Currently    Types: Marijuana    Comment: Last use 2020   Sexual activity: Not on file    Comment: Tubal  Other Topics Concern   Not on file  Social History Narrative   Not on file   Social Determinants of Health   Financial Resource Strain: Not on file  Food Insecurity: Not on file  Transportation Needs: Not on file  Physical Activity: Not on file  Stress: Not on file  Social Connections: Not on file  Intimate Partner Violence: Not on file    Review of Systems: Unable to obtain due to patient's confusion.     OBJECTIVE:    Physical Exam: Vital signs in last 24 hours: Temp:  [98.7 F (37.1 C)] 98.7 F (37.1 C) (07/25 0915) Pulse Rate:  [55-90] 80 (07/25 1234) Resp:  [16-18] 16 (07/25 1234) BP: (113-157)/(72-97) 157/97 (07/25 1234) SpO2:  [94 %-96 %] 96 % (07/25 1234)   General:   Alert  female in NAD Psych: cooperative, tries and does follow some commands. Eyes:   Pupils equal, sclera clear, no icterus.   Conjunctiva pink. Ears:  Normal auditory acuity. Nose:  No deformity, discharge,  or lesions. Mouth: dried blood in mouth. Tongue dry.  Neck:  Supple; no masses Lungs:  No wheezes heard but exam limited due to patient's participation.   Heart:  Regular rate and rhythm;  murmur present, significant edema of both upper extremities. Dialysis shunt in LUE. No lower extremity edema Abdomen:  Soft, non-distended, nontender, BS active, no palp mass   Rectal:  Deferred. ED already performed Msk:  Symmetrical without gross deformities. . Neurologic:  Alert. No oriented to place or time. Skin:  Intact without significant lesions or rashes.   Scheduled inpatient medications  sodium chloride flush  3 mL Intravenous Q12H      Intake/Output from previous day: No intake/output data recorded. Intake/Output this shift: No intake/output data recorded.   Lab Results: Recent Labs    05/19/21 0920  WBC 10.5  HGB 9.6*  HCT 29.6*  PLT 145*   BMET Recent Labs    05/19/21 0920  NA 132*  K 2.8*  CL 90*  CO2 31  GLUCOSE 211*  BUN 18  CREATININE 5.30*  CALCIUM 7.6*   LFT Recent Labs    05/19/21 0920  PROT 5.9*  ALBUMIN 2.4*  AST 57*  ALT 20  ALKPHOS 76  BILITOT 0.9   PT/INR No results for input(s): LABPROT, INR in the last 72 hours. Hepatitis Panel No results for input(s): HEPBSAG, HCVAB, HEPAIGM, HEPBIGM in the last 72 hours.   . CBC Latest Ref Rng & Units 05/19/2021 05/01/2021 03/10/2021  WBC 4.0 - 10.5 K/uL 10.5 7.4 9.6  Hemoglobin 12.0 - 15.0 g/dL 9.6(L) 9.6(L) 11.8(L)  Hematocrit 36.0 - 46.0 % 29.6(L) 30.9(L) 37.0  Platelets 150 - 400 K/uL 145(L) 180 155    . CMP Latest Ref Rng & Units 05/19/2021 05/01/2021 03/10/2021  Glucose 70 - 99 mg/dL 211(H) 107(H) 233(H)  BUN 8 - 23 mg/dL 18 12 40(H)  Creatinine 0.44 - 1.00 mg/dL 5.30(H)  4.58(H) 7.57(H)  Sodium 135 - 145 mmol/L 132(L) 138 136  Potassium 3.5 - 5.1 mmol/L 2.8(L) 3.5 2.8(L)   Chloride 98 - 111 mmol/L 90(L) 95(L) 89(L)  CO2 22 - 32 mmol/L 31 30 35(H)  Calcium 8.9 - 10.3 mg/dL 7.6(L) 8.7(L) 9.2  Total Protein 6.5 - 8.1 g/dL 5.9(L) 6.8 8.4(H)  Total Bilirubin 0.3 - 1.2 mg/dL 0.9 0.9 0.4  Alkaline Phos 38 - 126 U/L 76 68 131(H)  AST 15 - 41 U/L 57(H) 38 20  ALT 0 - 44 U/L '20 13 12   '$ Studies/Results: No results found.  Principal Problem:   GI bleed Active Problems:   Essential hypertension   Type 2 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, with long-term current use of insulin (HCC)   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   ESRD on dialysis ([place])   Dyslipidemia   Hypokalemia   Stage 2 skin ulcer of sacral region [facility])    Tye Savoy, NP-C @  05/19/2021, 2:33 PM   Attending physician's note   I have taken an interval history, reviewed the chart and examined the patient. I agree with the Advanced Practitioner's note, impression and recommendations.   75yrold NH resident with DM, HTN, HLD, ERSD on HD (MWF), CVA on eliquis (last dose 7/25), blindness, H/O proctocolitis (?stercoral ulcers) 02/2021  With UGI bleed likely. HD stable. Hb 9.6 (baseline 10-11)  Plan: -IV Protonix -Trend CBC -She is for hemodialysis tomorrow. -EGD after Eliquis washout. Likely 7/27 -Call if there is any change in clinical status   RCarmell Austria MD LVelora HecklerGI [phone]

## 2021-05-19 NOTE — ED Triage Notes (Signed)
BIB EMS for rectal bleeding discovered this morning when they changed her diaper. No clots. On dialysis MWF, BP 118/70, 90HR, 18rr, CBG 358. Oriented x 3 (baseline).

## 2021-05-19 NOTE — Consult Note (Signed)
[facility] Renal Consultation Note    Indication for Consultation:  Management of ESRD/hemodialysis; anemia, hypertension/volume and secondary hyperparathyroidism PCP:  HPI: Amy [name] is a 75 y.o. 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 Resides in [facility]. She is admitted for evaluation of melena/rectal bleeding. Labs significant for Na 132, K 2.8 Hb 9.6. +FOBT.  GI consulted -will likely need EGD.   Nephrology consulted for dialysis needs. Dialysis MWF at [facility]. Dry weight was just lowered. Noted there was some concern about her suitably for outpatient dialysis.  Seen and examined in ED. She can tell me her name, but otherwise just mumbles. Denies cp, sob, abd pain.   Past Medical History:  Diagnosis Date   Chronic kidney disease    dialysis M-W-F - dialysis cath located in right side of chest   Diabetes mellitus without complication ([place])    type 2 - no meds    HLD (hyperlipidemia)    diet controlled   Hypertension    no meds   Past Surgical History:  Procedure Laterality Date   A/V FISTULAGRAM Left 01/10/2021   Procedure: A/V FISTULAGRAM;  Surgeon: Angelia Mould, MD;  Location: [place] [facility];  Service: Cardiovascular;  Laterality: Left;   A/V FISTULAGRAM N/A 01/27/2021   Procedure: A/V FISTULAGRAM;  Surgeon: Waynetta Sandy, MD;  Location: [place] [facility];  Service: Cardiovascular;  Laterality: N/A;   AV FISTULA PLACEMENT Left 09/24/2020   Procedure: LEFT UPPER ARM ARTERIOVENOUS GORTEX  GRAFT;  Surgeon: Elam Dutch, MD;  Location: [place];  Service: Vascular;  Laterality: Left;   BREAST CYST EXCISION Right 2011   cyst removed -neg   EYE SURGERY Bilateral    cataracts   INSERTION OF DIALYSIS CATHETER Right 07/2019   PERIPHERAL VASCULAR BALLOON ANGIOPLASTY  01/10/2021   Procedure: PERIPHERAL VASCULAR BALLOON ANGIOPLASTY;  Surgeon:  Angelia Mould, MD;  Location: [place] [facility];  Service: Cardiovascular;;  left AV graft   TUBAL LIGATION     Ultrasound-guided cannulation left upper arm AV graft  01/27/2021   Family History  Problem Relation Age of Onset   Breast cancer Neg Hx    Social History:  reports that she has quit smoking. She has never used smokeless tobacco. She reports current alcohol use. She reports previous drug use. Drug: Marijuana. Allergies  Allergen Reactions   Sensipar [Cinacalcet]     Per MAR   Statins     Per MAR   Prior to Admission medications   Medication Sig Start Date End Date Taking? Authorizing Provider  acetaminophen (TYLENOL) 500 MG tablet Take 1,000 mg by mouth every 6 (six) hours as needed for mild pain.   Yes [provider]  apixaban (ELIQUIS) 5 MG TABS tablet Take 5 mg by mouth 2 (two) times daily.   Yes [provider]  atorvastatin (LIPITOR) 40 MG tablet Take 40 mg by mouth daily.   Yes [provider]  dibucaine (NUPERCAINAL) 1 % OINT Place 1 application rectally every 2 (two) hours as needed (pain).   Yes [provider]  diclofenac Sodium (VOLTAREN) 1 % GEL Apply 2 g topically 4 (four) times daily.   Yes [provider]  FLUoxetine (PROZAC) 40 MG capsule Take 40 mg by mouth daily.   Yes [provider]  gabapentin (NEURONTIN) 100 MG capsule Take 100 mg by mouth at bedtime.  Yes [provider]  hydrocortisone 1 % ointment Apply 1 application topically 2 (two) times daily. Left side of neck   Yes [provider]  lactulose (CHRONULAC) 10 GM/15ML solution Take 60 mLs (40 g total) by mouth daily as needed for mild constipation. Patient taking differently: Take 27 g by mouth daily as needed for mild constipation. 03/11/21  Yes [name], MD  lidocaine (LIDODERM) 5 % Place 1 patch onto the skin See admin instructions. Apply to right neck in the morning and remove at bedtime   Yes  [provider]  lidocaine-prilocaine (EMLA) cream Apply 1 application topically every Monday, Wednesday, and Friday. 1-2  Hours prior to dialysis 01/17/21  Yes [provider]  midodrine (PROAMATINE) 10 MG tablet Take 10 mg by mouth in the morning and at bedtime.   Yes [provider]  mirtazapine (REMERON) 7.5 MG tablet Take 7.5 mg by mouth at bedtime.   Yes [provider]  multivitamin (RENA-VIT) TABS tablet Take 1 tablet by mouth daily.   Yes [provider]  Nutritional Supplements (ENSURE CLEAR PO) Take 237 mLs by mouth in the morning, at noon, and at bedtime.   Yes [provider]  ondansetron (ZOFRAN) 4 MG tablet Take 4 mg by mouth every 8 (eight) hours as needed for vomiting or nausea. 03/03/21  Yes [provider]  ondansetron (ZOFRAN) 4 MG tablet Take 4 mg by mouth every 6 (six) hours as needed for nausea or vomiting.   Yes [provider]  oxyCODONE (OXY IR/ROXICODONE) 5 MG immediate release tablet Take 5 mg by mouth every 12 (twelve) hours.   Yes [provider]  pantoprazole (PROTONIX) 20 MG tablet Take 1 tablet (20 mg total) by mouth daily. 03/11/21  Yes [name], MD  venlafaxine XR (EFFEXOR-XR) 75 MG 24 hr capsule Take 75 mg by mouth daily. 02/03/21  Yes [provider]  acetaminophen (TYLENOL) 325 MG tablet Take 650 mg by mouth every 6 (six) hours as needed for moderate pain or headache. Patient not taking: Reported on 05/02/2021    [provider]  calcium acetate (PHOSLO) 667 MG capsule Take 1,334 mg by mouth 3 (three) times daily with meals. 01/02/20   [provider]  Etelcalcetide HCl (PARSABIV IV) Etelcalcetide Hermina Staggers) Patient not taking: Reported on 05/02/2021 02/17/21 01/22/22  [provider]  ethyl chloride spray SPRAY 1 SMALL AMOUT ONTO THE SKIN 3 TIMES A WEEK AS NEEDED Patient not taking: Reported on 05/02/2021 12/17/20   [provider]   Nutritional Supplements (NEPRO PO) Take 237 mLs by mouth in the morning and at bedtime.    [provider]  nystatin cream (MYCOSTATIN) Apply 1 application topically See admin instructions. Bid x 14 days for yeast--apply to groin    [provider]  nystatin cream (MYCOSTATIN) Apply 1 application topically See admin instructions. Apply to groin as needed x 14 days for yeast    [provider]   Current Facility-Administered Medications  Medication Dose Route Frequency Provider Last Rate Last Admin   0.9 %  sodium chloride infusion  250 mL Intravenous PRN Orma Flaming, MD       pantoprozole (PROTONIX) 80 mg /NS 100 mL infusion  8 mg/hr Intravenous Continuous Providence Lanius A, PA-C 10 mL/hr at 05/19/21 1234 8 mg/hr at 05/19/21 1234   potassium chloride 10 mEq in 100 mL IVPB  10 mEq Intravenous Q1 Hr x 3 Orma Flaming, MD 100 mL/hr at 05/19/21  1425 10 mEq at 05/19/21 1425   sodium chloride flush (NS) 0.9 % injection 3 mL  3 mL Intravenous Q12H Orma Flaming, MD       sodium chloride flush (NS) 0.9 % injection 3 mL  3 mL Intravenous PRN Orma Flaming, MD       Current Outpatient Medications  Medication Sig Dispense Refill   acetaminophen (TYLENOL) 500 MG tablet Take 1,000 mg by mouth every 6 (six) hours as needed for mild pain.     apixaban (ELIQUIS) 5 MG TABS tablet Take 5 mg by mouth 2 (two) times daily.     atorvastatin (LIPITOR) 40 MG tablet Take 40 mg by mouth daily.     dibucaine (NUPERCAINAL) 1 % OINT Place 1 application rectally every 2 (two) hours as needed (pain).     diclofenac Sodium (VOLTAREN) 1 % GEL Apply 2 g topically 4 (four) times daily.     FLUoxetine (PROZAC) 40 MG capsule Take 40 mg by mouth daily.     gabapentin (NEURONTIN) 100 MG capsule Take 100 mg by mouth at bedtime.     hydrocortisone 1 % ointment Apply 1 application topically 2 (two) times daily. Left side of neck     lactulose (CHRONULAC) 10 GM/15ML solution Take 60 mLs (40 g total) by  mouth daily as needed for mild constipation. (Patient taking differently: Take 27 g by mouth daily as needed for mild constipation.) 236 mL 2   lidocaine (LIDODERM) 5 % Place 1 patch onto the skin See admin instructions. Apply to right neck in the morning and remove at bedtime     lidocaine-prilocaine (EMLA) cream Apply 1 application topically every Monday, Wednesday, and Friday. 1-2  Hours prior to dialysis     midodrine (PROAMATINE) 10 MG tablet Take 10 mg by mouth in the morning and at bedtime.     mirtazapine (REMERON) 7.5 MG tablet Take 7.5 mg by mouth at bedtime.     multivitamin (RENA-VIT) TABS tablet Take 1 tablet by mouth daily.     Nutritional Supplements (ENSURE CLEAR PO) Take 237 mLs by mouth in the morning, at noon, and at bedtime.     ondansetron (ZOFRAN) 4 MG tablet Take 4 mg by mouth every 8 (eight) hours as needed for vomiting or nausea.     ondansetron (ZOFRAN) 4 MG tablet Take 4 mg by mouth every 6 (six) hours as needed for nausea or vomiting.     oxyCODONE (OXY IR/ROXICODONE) 5 MG immediate release tablet Take 5 mg by mouth every 12 (twelve) hours.     pantoprazole (PROTONIX) 20 MG tablet Take 1 tablet (20 mg total) by mouth daily. 30 tablet 0   venlafaxine XR (EFFEXOR-XR) 75 MG 24 hr capsule Take 75 mg by mouth daily.     acetaminophen (TYLENOL) 325 MG tablet Take 650 mg by mouth every 6 (six) hours as needed for moderate pain or headache. (Patient not taking: Reported on 05/02/2021)     calcium acetate (PHOSLO) 667 MG capsule Take 1,334 mg by mouth 3 (three) times daily with meals.     Etelcalcetide HCl (PARSABIV IV) Etelcalcetide Hermina Staggers) (Patient not taking: Reported on 05/02/2021)     ethyl chloride spray SPRAY 1 SMALL AMOUT ONTO THE SKIN 3 TIMES A WEEK AS NEEDED (Patient not taking: Reported on 05/02/2021)     Nutritional Supplements (NEPRO PO) Take 237 mLs by mouth in the morning and at bedtime.     nystatin cream (MYCOSTATIN) Apply 1 application topically See admin  instructions. Bid x 14 days for yeast--apply to groin     nystatin cream (MYCOSTATIN) Apply 1 application topically See admin instructions. Apply to groin as needed x 14 days for yeast       ROS: As per HPI otherwise negative.  Physical Exam: Vitals:   05/19/21 1000 05/19/21 1106 05/19/21 1234 05/19/21 1434  BP: 113/72 124/74 (!) 157/97 (!) 132/95  Pulse: 90 88 80 97  Resp: '16 16 16 19  '$ Temp:      TempSrc:      SpO2: 94% 95% 96% 94%     General: Chronically ill appearing, nad   Head: NCAT sclera not icteric MMM Neck: Supple. No JVD  Lungs: CTA bilaterally without wheezes, rales, or rhonchi. Breathing is unlabored. Heart: RRR with S1 S2 Abdomen: soft  non-tender  Lower extremities:without edema or ischemic changes, no open wounds  Neuro: A & O  X 1. Speech garbled. Right sided weakness  Psych:  Responds to questions appropriately with a normal affect. Dialysis Access: LUE AVG +bruit; significant  UE edema   Labs: Basic Metabolic Panel: Recent Labs  Lab 05/19/21 0920  NA 132*  K 2.8*  CL 90*  CO2 31  GLUCOSE 211*  BUN 18  CREATININE 5.30*  CALCIUM 7.6*   Liver Function Tests: Recent Labs  Lab 05/19/21 0920  AST 57*  ALT 20  ALKPHOS 76  BILITOT 0.9  PROT 5.9*  ALBUMIN 2.4*   No results for input(s): LIPASE, AMYLASE in the last 168 hours. No results for input(s): AMMONIA in the last 168 hours. CBC: Recent Labs  Lab 05/19/21 0920  WBC 10.5  NEUTROABS 7.5  HGB 9.6*  HCT 29.6*  MCV 97.4  PLT 145*   Cardiac Enzymes: No results for input(s): CKTOTAL, CKMB, CKMBINDEX, TROPONINI in the last 168 hours. CBG: No results for input(s): GLUCAP in the last 168 hours. Iron Studies: No results for input(s): IRON, TIBC, TRANSFERRIN, FERRITIN in the last 72 hours. Studies/Results: No results found.  Dialysis Orders:   AF MWF 3h 18mn 400/500 EDW 62.5kg 3K/2.5 Ca UFP 2  AVG HeRO No heparin  -Mircera 50 q 2 wks  (7/13)  Venofer 50 q wk  -Parsabiv 5  Post wt  60.6kg on 7/22   Assessment/Plan: Melena/Rectal bleeding - GI consulted. Likely for EGD.  Hypokalemia - Repletion with KCl. Follow trends. Use added K+ bath  ESRD -  HD MWF. Plan for HD tomorrow off schedule d/t high inpatient HD census today.  LUE edema; Had f'gram 6/23 which showed chronic thrombus and was started on Eliquis  Hypertension/volume  - BP ok. On midodrine for BP support.  Below dry weight. UF as able  Anemia  - Hgb 9.6 on ESA, weekly Fe. Next ESA due 7/27.  Metabolic bone disease -  Corr Ca ok. No binders currently  Nutrition - CL diet at present. Consider liberalized diet when tolerating PO.   OLynnda ChildPA-C [facility] 05/19/2021, 3:36 PM

## 2021-05-19 NOTE — ED Notes (Signed)
Renal consult at bedside.  Left upper extremity edema noted.  IV potassium infusion slowed due to ESRD

## 2021-05-19 NOTE — ED Notes (Signed)
Son, Amy [name], called to make staff aware that he had released pt medical chart from [facility] so that it is viewable to [facility] providers.

## 2021-05-19 NOTE — ED Notes (Addendum)
Multiple attempts to obtain ordered labs unsuccessful.  Phlebotomy to attempt

## 2021-05-19 NOTE — Progress Notes (Signed)
Patient will dialyze tomorrow. We will plan for EGD on Wed. This will allow time for Eliquis washout.

## 2021-05-19 NOTE — ED Notes (Signed)
Call to [facility] to notify of inpatient admission orders.  Facility states will call son [name] to notify of admission

## 2021-05-19 NOTE — ED Notes (Signed)
Unable to obtain iv access/bloodwork, IV team consult ordered.  Pt restricted left arm due to dialysis access.

## 2021-05-19 NOTE — H&P (Addendum)
History and Physical    Amy [name] [id number] DOB: 1946/03/09 DOA: 05/19/2021  PCP: [name], MD Consultants:  nephrology at [facility] doesn't know who she sees.  Patient coming from: [facility]  Chief Complaint: dark and bloody stools   HPI: Amy [name] is a 75 y.o. 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   Had episode of dark stool last night in the NH. Fecal occult +. This AM had another episode with more blood so sent her to ER.  Last dose of eliquis was yesterday. She denies any chest pain, shortness of breath/cough, dizziness or lightheadedness. Denies abdominal pain, N/V/D. Has not had dialysis today came in ER this morning. Has some confusion at baseline per her son.   No hx of colonoscopies ever per family. No family hx of colon cancer.   Admitted to [facility] on 5/19-6/28/22. Had flex sig on 6/1 multiple rectal ulcers likely 2/2 to trauma from enemas and constipation.   ED Course: vitals: afebrile, bp: 126/88, HR: 55, RR: 18 oxygen: 94% room air. Pertinent labs: Potassium: 2.8, Sodium: 132, BUN 18 and creatinine 5.30 Fecal occult: positive , Hgb: 9.6 (9.6 two weeks ago), Hct: 29.6 GI consulted by EDP. Protonix bolus and drip.   Review of Systems: As per HPI; otherwise review of systems reviewed and negative.   Ambulatory Status:  wheelchair. Left hemiplegia.    Past Medical History:  Diagnosis Date   Chronic kidney disease    dialysis M-W-F - dialysis cath located in right side of chest   Diabetes mellitus without complication ([place])    type 2 - no meds    HLD (hyperlipidemia)    diet controlled   Hypertension    no meds    Past Surgical History:  Procedure Laterality Date   A/V FISTULAGRAM Left 01/10/2021   Procedure: A/V FISTULAGRAM;  Surgeon: Angelia Mould,  MD;  Location: [place] [facility];  Service: Cardiovascular;  Laterality: Left;   A/V FISTULAGRAM N/A 01/27/2021   Procedure: A/V FISTULAGRAM;  Surgeon: Waynetta Sandy, MD;  Location: [place] [facility];  Service: Cardiovascular;  Laterality: N/A;   AV FISTULA PLACEMENT Left 09/24/2020   Procedure: LEFT UPPER ARM ARTERIOVENOUS GORTEX  GRAFT;  Surgeon: Elam Dutch, MD;  Location: [place];  Service: Vascular;  Laterality: Left;   BREAST CYST EXCISION Right 2011   cyst removed -neg   EYE SURGERY Bilateral    cataracts   INSERTION OF DIALYSIS CATHETER Right 07/2019   PERIPHERAL VASCULAR BALLOON ANGIOPLASTY  01/10/2021   Procedure: PERIPHERAL VASCULAR BALLOON ANGIOPLASTY;  Surgeon: Angelia Mould, MD;  Location: [place] [facility];  Service: Cardiovascular;;  left AV graft   TUBAL LIGATION     Ultrasound-guided cannulation left upper arm AV graft  01/27/2021    Social History   Socioeconomic History   Marital status: Single    Spouse name: Not on file   Number of children: Not on file   Years of education: Not on file   Highest education level: Not on file  Occupational History   Not on file  Tobacco Use   Smoking status: Former   Smokeless tobacco: Never  Vaping Use   Vaping Use: Never used  Substance and Sexual Activity   Alcohol use: Yes    Comment:  occasional   Drug use: Not Currently    Types: Marijuana    Comment: Last use 2020   Sexual activity: Not on file    Comment: Tubal  Other Topics Concern   Not on file  Social History Narrative   Not on file   Social Determinants of Health   Financial Resource Strain: Not on file  Food Insecurity: Not on file  Transportation Needs: Not on file  Physical Activity: Not on file  Stress: Not on file  Social Connections: Not on file  Intimate Partner Violence: Not on file    Allergies  Allergen Reactions   Sensipar [Cinacalcet]     Per MAR   Statins     Per MAR    Family History  Problem  Relation Age of Onset   Breast cancer Neg Hx     Prior to Admission medications   Medication Sig Start Date End Date Taking? Authorizing Provider  acetaminophen (TYLENOL) 325 MG tablet Take 650 mg by mouth every 6 (six) hours as needed for moderate pain or headache. Patient not taking: Reported on 05/02/2021    [provider]  acetaminophen (TYLENOL) 500 MG tablet Take 1,000 mg by mouth every 6 (six) hours as needed for mild pain.    [provider]  apixaban (ELIQUIS) 5 MG TABS tablet Take 5 mg by mouth 2 (two) times daily.    [provider]  atorvastatin (LIPITOR) 40 MG tablet Take 40 mg by mouth daily.    [provider]  calcium acetate (PHOSLO) 667 MG capsule Take 1,334 mg by mouth 3 (three) times daily with meals. 01/02/20   [provider]  dibucaine (NUPERCAINAL) 1 % OINT Place 1 application rectally every 2 (two) hours as needed (pain).    [provider]  diclofenac Sodium (VOLTAREN) 1 % GEL Apply 2 g topically 4 (four) times daily.    [provider]  Etelcalcetide HCl (PARSABIV IV) Etelcalcetide Hermina Staggers) Patient not taking: Reported on 05/02/2021 02/17/21 01/22/22  [provider]  ethyl chloride spray SPRAY 1 SMALL AMOUT ONTO THE SKIN 3 TIMES A WEEK AS NEEDED Patient not taking: Reported on 05/02/2021 12/17/20   [provider]  FLUoxetine (PROZAC) 40 MG capsule Take 40 mg by mouth daily.    [provider]  hydrocortisone 1 % ointment Apply 1 application topically 2 (two) times daily. Left side of neck    [provider]  lactulose (CHRONULAC) 10 GM/15ML solution Take 60 mLs (40 g total) by mouth daily as needed for mild constipation. 03/11/21   [name], MD  lidocaine (LIDODERM) 5 % Place 1 patch onto the skin See admin instructions. Apply in the morning and remove at bedtime    [provider]  lidocaine-prilocaine (EMLA) cream Apply 1 application topically every  Monday, Wednesday, and Friday. 1-2  Hours prior to dialysis 01/17/21   [provider]  midodrine (PROAMATINE) 10 MG tablet Take 10 mg by mouth in the morning and at bedtime.    [provider]  multivitamin (RENA-VIT) TABS tablet Take 1 tablet by mouth daily.    [provider]  Nutritional Supplements (NEPRO PO) Take 237 mLs by mouth in the morning and at bedtime.    [provider]  nystatin cream (MYCOSTATIN) Apply 1 application topically See admin instructions. Bid x 14 days for yeast--apply to groin    [provider]  nystatin cream (MYCOSTATIN) Apply 1 application topically See admin instructions. Apply to  groin as needed x 14 days for yeast    [provider]  ondansetron (ZOFRAN) 4 MG tablet Take 4 mg by mouth every 8 (eight) hours as needed for vomiting or nausea. 03/03/21   [provider]  oxyCODONE (OXY IR/ROXICODONE) 5 MG immediate release tablet Take 5 mg by mouth 2 (two) times daily as needed (chronic hip pain).    [provider]  pantoprazole (PROTONIX) 20 MG tablet Take 1 tablet (20 mg total) by mouth daily. 03/11/21   [name], MD  venlafaxine XR (EFFEXOR-XR) 75 MG 24 hr capsule Take 75 mg by mouth daily. 02/03/21   [provider]    Physical Exam: Vitals:   05/19/21 0915 05/19/21 1000 05/19/21 1106 05/19/21 1234  BP: 126/88 113/72 124/74 (!) 157/97  Pulse: (!) 55 90 88 80  Resp: '18 16 16 16  '$ Temp: 98.7 F (37.1 C)     TempSrc: Oral     SpO2:  94% 95% 96%     General:  Appears calm and comfortable and is in NAD Eyes:  PERRL, EOMI, normal lids, iris. Looking toward the right.  ENT:  grossly normal hearing, lips & tongue, dry mucous membranes; poor dentition Neck:  no LAD, masses or thyromegaly; no carotid bruits Cardiovascular:  RRR, no m/r/g. No LE edema.  Respiratory:   CTA bilaterally with no wheezes/rales/rhonchi.  Normal respiratory effort. Abdomen:  soft, NT, ND,  NABS Back:   normal alignment, no CVAT Skin:  no rash or induration seen on limited exam Musculoskeletal:  hemiplegic on left. Left wrist contracture. LUE edema.  no bony abnormality Lower extremity:  No LE edema.  Limited foot exam with no ulcerations.  2+ distal pulses. Psychiatric:  grossly normal mood and affect, speech somewhat unintelligent. Alert to place, but somewhat confused on conversation. Moving right arm for something that isn't there.  Neurologic:  limited exam. Would not follow directions.  Left hemiplegia.    Radiological Exams on Admission: Independently reviewed - see discussion in A/P where applicable  No results found.  EKG: pending   Labs on Admission: I have personally reviewed the available labs and imaging studies at the time of the admission.  Pertinent labs:  Potassium: 2.8 Sodium: 132 BUN 18 and creatinine 5.30 Fecal occult: positive  Hgb: 9.6 (9.6 two weeks ago) Hct: 29.6   Assessment/Plan Principal Problem:   GI bleed -GI consulted and will see, likely EGD -flex sig on 6/1 at [facility] with multiple rectal ulcers. No hx of ever having a colonoscopy.  -hold home eliquis -clear liquid diet. No active bleed at this time.  -protonix bolus and drip started in ED -f/u on GI recommendations.  -CBC q4 hours x 2 and in AM.  -transfuse if hgb <7  Active Problems:   ESRD on dialysis [facility]) Nephrology consulted and will see. Dialysis MWF. Has not had today     Hypokalemia -replace x3mq. Okay per renal.  -repeat bmp and check magnesium   Sacral ulcer Per family stage 2. Will have nursing continue wound care.   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     Hx of hypotension at last hospitalization [facility] is still on this. Med rec not available yet from SNF -bp stable.     Type 2 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, with long-term current use of  insulin (HCC) -recent a1c of 6.9. diet controlled.  -SSI/accuchecks  Dyslipidemia -continue statin     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  -hgb of 9.6 with rectal bleed. Baseline around 10-11.  -weekly iron, next ESA on 7/27 -continue to monitor -transfuse if hgb <7.0   There is no height or weight on file to calculate BMI.   Level of care: Telemetry Medical DVT prophylaxis:  TED Code Status:  Full - confirmed with family. Son is her POA  Family Communication: None present; I spoke with the patient's son and daughter by telephone at the time of admission. [name] [place]: [phone] and kim Disposition Plan:  The patient is from: SNF  Anticipated d/c is to: SNF  Anticipated d/c date will depend on procedures needed and response to treatment. Requires inpatient hospitalization for GI bleed and work up.   Consults called: GI and nephrology   Admission status:  observation    Orma Flaming MD [facility]   How to contact the [facility] Attending or Consulting provider [place] or covering provider during after hours [place], for this patient?  Check the care team in [facility] and look for a) attending/consulting [facility] provider listed and b) the [facility] team listed Log into [email or web address] and use [facility]'s universal password to access. If you do not have the password, please contact the hospital operator. Locate the [facility] provider you are looking for under [facility] and page to a number that you can be directly reached. If you still have difficulty reaching the provider, please page the [facility] (Director on Call) for the Hospitalists listed on amion for assistance.   05/19/2021, 1:42 PM

## 2021-05-20 DIAGNOSIS — Y95 Nosocomial condition: Secondary | ICD-10-CM | POA: Diagnosis not present

## 2021-05-20 DIAGNOSIS — G9349 Other encephalopathy: Secondary | ICD-10-CM | POA: Diagnosis present

## 2021-05-20 DIAGNOSIS — R4182 Altered mental status, unspecified: Secondary | ICD-10-CM | POA: Diagnosis not present

## 2021-05-20 DIAGNOSIS — Z7189 Other specified counseling: Secondary | ICD-10-CM | POA: Diagnosis not present

## 2021-05-20 DIAGNOSIS — G9340 Encephalopathy, unspecified: Secondary | ICD-10-CM | POA: Diagnosis not present

## 2021-05-20 DIAGNOSIS — R1312 Dysphagia, oropharyngeal phase: Secondary | ICD-10-CM | POA: Diagnosis not present

## 2021-05-20 DIAGNOSIS — M549 Dorsalgia, unspecified: Secondary | ICD-10-CM | POA: Diagnosis not present

## 2021-05-20 DIAGNOSIS — Y832 Surgical operation with anastomosis, bypass or graft as the cause of abnormal reaction of the patient, or of later complication, without mention of misadventure at the time of the procedure: Secondary | ICD-10-CM | POA: Diagnosis not present

## 2021-05-20 DIAGNOSIS — D631 Anemia in chronic kidney disease: Secondary | ICD-10-CM | POA: Diagnosis present

## 2021-05-20 DIAGNOSIS — T782XXA Anaphylactic shock, unspecified, initial encounter: Secondary | ICD-10-CM | POA: Diagnosis not present

## 2021-05-20 DIAGNOSIS — K633 Ulcer of intestine: Secondary | ICD-10-CM | POA: Diagnosis present

## 2021-05-20 DIAGNOSIS — I6330 Cerebral infarction due to thrombosis of unspecified cerebral artery: Secondary | ICD-10-CM | POA: Diagnosis not present

## 2021-05-20 DIAGNOSIS — I6389 Other cerebral infarction: Secondary | ICD-10-CM | POA: Diagnosis not present

## 2021-05-20 DIAGNOSIS — K625 Hemorrhage of anus and rectum: Secondary | ICD-10-CM | POA: Diagnosis not present

## 2021-05-20 DIAGNOSIS — R627 Adult failure to thrive: Secondary | ICD-10-CM | POA: Diagnosis not present

## 2021-05-20 DIAGNOSIS — T85598A Other mechanical complication of other gastrointestinal prosthetic devices, implants and grafts, initial encounter: Secondary | ICD-10-CM | POA: Diagnosis not present

## 2021-05-20 DIAGNOSIS — R638 Other symptoms and signs concerning food and fluid intake: Secondary | ICD-10-CM | POA: Diagnosis not present

## 2021-05-20 DIAGNOSIS — E11649 Type 2 diabetes mellitus with hypoglycemia without coma: Secondary | ICD-10-CM | POA: Diagnosis not present

## 2021-05-20 DIAGNOSIS — R569 Unspecified convulsions: Secondary | ICD-10-CM | POA: Diagnosis not present

## 2021-05-20 DIAGNOSIS — Z992 Dependence on renal dialysis: Secondary | ICD-10-CM | POA: Diagnosis not present

## 2021-05-20 DIAGNOSIS — G9341 Metabolic encephalopathy: Secondary | ICD-10-CM | POA: Diagnosis not present

## 2021-05-20 DIAGNOSIS — E785 Hyperlipidemia, unspecified: Secondary | ICD-10-CM | POA: Diagnosis not present

## 2021-05-20 DIAGNOSIS — R531 Weakness: Secondary | ICD-10-CM | POA: Diagnosis not present

## 2021-05-20 DIAGNOSIS — I6350 Cerebral infarction due to unspecified occlusion or stenosis of unspecified cerebral artery: Secondary | ICD-10-CM | POA: Diagnosis not present

## 2021-05-20 DIAGNOSIS — D696 Thrombocytopenia, unspecified: Secondary | ICD-10-CM | POA: Diagnosis present

## 2021-05-20 DIAGNOSIS — Z794 Long term (current) use of insulin: Secondary | ICD-10-CM | POA: Diagnosis not present

## 2021-05-20 DIAGNOSIS — Z20822 Contact with and (suspected) exposure to covid-19: Secondary | ICD-10-CM | POA: Diagnosis present

## 2021-05-20 DIAGNOSIS — E43 Unspecified severe protein-calorie malnutrition: Secondary | ICD-10-CM | POA: Diagnosis present

## 2021-05-20 DIAGNOSIS — F05 Delirium due to known physiological condition: Secondary | ICD-10-CM | POA: Diagnosis not present

## 2021-05-20 DIAGNOSIS — K921 Melena: Secondary | ICD-10-CM | POA: Diagnosis present

## 2021-05-20 DIAGNOSIS — Y712 Prosthetic and other implants, materials and accessory cardiovascular devices associated with adverse incidents: Secondary | ICD-10-CM | POA: Diagnosis not present

## 2021-05-20 DIAGNOSIS — R1115 Cyclical vomiting syndrome unrelated to migraine: Secondary | ICD-10-CM | POA: Diagnosis not present

## 2021-05-20 DIAGNOSIS — K922 Gastrointestinal hemorrhage, unspecified: Secondary | ICD-10-CM | POA: Diagnosis present

## 2021-05-20 DIAGNOSIS — F0280 Dementia in other diseases classified elsewhere without behavioral disturbance: Secondary | ICD-10-CM | POA: Diagnosis not present

## 2021-05-20 DIAGNOSIS — N186 End stage renal disease: Secondary | ICD-10-CM | POA: Diagnosis present

## 2021-05-20 DIAGNOSIS — R5383 Other fatigue: Secondary | ICD-10-CM | POA: Diagnosis not present

## 2021-05-20 DIAGNOSIS — E1165 Type 2 diabetes mellitus with hyperglycemia: Secondary | ICD-10-CM | POA: Diagnosis not present

## 2021-05-20 DIAGNOSIS — I10 Essential (primary) hypertension: Secondary | ICD-10-CM | POA: Diagnosis not present

## 2021-05-20 DIAGNOSIS — Z66 Do not resuscitate: Secondary | ICD-10-CM | POA: Diagnosis not present

## 2021-05-20 DIAGNOSIS — D638 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: Secondary | ICD-10-CM | POA: Diagnosis not present

## 2021-05-20 DIAGNOSIS — R5081 Fever presenting with conditions classified elsewhere: Secondary | ICD-10-CM | POA: Diagnosis not present

## 2021-05-20 DIAGNOSIS — I953 Hypotension of hemodialysis: Secondary | ICD-10-CM | POA: Diagnosis not present

## 2021-05-20 DIAGNOSIS — I69354 Hemiplegia and hemiparesis following cerebral infarction affecting left non-dominant side: Secondary | ICD-10-CM | POA: Diagnosis not present

## 2021-05-20 DIAGNOSIS — D62 Acute posthemorrhagic anemia: Secondary | ICD-10-CM | POA: Diagnosis present

## 2021-05-20 DIAGNOSIS — I639 Cerebral infarction, unspecified: Secondary | ICD-10-CM | POA: Diagnosis not present

## 2021-05-20 DIAGNOSIS — G9389 Other specified disorders of brain: Secondary | ICD-10-CM | POA: Diagnosis present

## 2021-05-20 DIAGNOSIS — R1310 Dysphagia, unspecified: Secondary | ICD-10-CM | POA: Diagnosis not present

## 2021-05-20 DIAGNOSIS — E1122 Type 2 diabetes mellitus with diabetic chronic kidney disease: Secondary | ICD-10-CM | POA: Diagnosis present

## 2021-05-20 DIAGNOSIS — Z515 Encounter for palliative care: Secondary | ICD-10-CM | POA: Diagnosis not present

## 2021-05-20 DIAGNOSIS — J189 Pneumonia, unspecified organism: Secondary | ICD-10-CM | POA: Diagnosis not present

## 2021-05-20 DIAGNOSIS — A419 Sepsis, unspecified organism: Secondary | ICD-10-CM | POA: Diagnosis not present

## 2021-05-20 DIAGNOSIS — R609 Edema, unspecified: Secondary | ICD-10-CM | POA: Diagnosis not present

## 2021-05-20 DIAGNOSIS — I120 Hypertensive chronic kidney disease with stage 5 chronic kidney disease or end stage renal disease: Secondary | ICD-10-CM | POA: Diagnosis present

## 2021-05-20 DIAGNOSIS — E1142 Type 2 diabetes mellitus with diabetic polyneuropathy: Secondary | ICD-10-CM | POA: Diagnosis present

## 2021-05-20 DIAGNOSIS — L98429 Non-pressure chronic ulcer of back with unspecified severity: Secondary | ICD-10-CM | POA: Diagnosis not present

## 2021-05-20 DIAGNOSIS — R1319 Other dysphagia: Secondary | ICD-10-CM | POA: Diagnosis not present

## 2021-05-20 LAB — COMPREHENSIVE METABOLIC PANEL
ALT: 25 U/L (ref 0–44)
AST: 55 U/L — ABNORMAL HIGH (ref 15–41)
Albumin: 2.3 g/dL — ABNORMAL LOW (ref 3.5–5.0)
Alkaline Phosphatase: 68 U/L (ref 38–126)
Anion gap: 12 (ref 5–15)
BUN: 26 mg/dL — ABNORMAL HIGH (ref 8–23)
CO2: 26 mmol/L (ref 22–32)
Calcium: 7.6 mg/dL — ABNORMAL LOW (ref 8.9–10.3)
Chloride: 94 mmol/L — ABNORMAL LOW (ref 98–111)
Creatinine, Ser: 5.95 mg/dL — ABNORMAL HIGH (ref 0.44–1.00)
GFR, Estimated: 7 mL/min — ABNORMAL LOW (ref >60)
Glucose, Bld: 190 mg/dL — ABNORMAL HIGH (ref 70–99)
Potassium: 2.9 mmol/L — ABNORMAL LOW (ref 3.5–5.1)
Sodium: 132 mmol/L — ABNORMAL LOW (ref 135–145)
Total Bilirubin: 0.6 mg/dL (ref 0.3–1.2)
Total Protein: 5.7 g/dL — ABNORMAL LOW (ref 6.5–8.1)

## 2021-05-20 LAB — CBC
HCT: 26.6 % — ABNORMAL LOW (ref 36.0–46.0)
Hemoglobin: 8.9 g/dL — ABNORMAL LOW (ref 12.0–15.0)
MCH: 31.7 pg (ref 26.0–34.0)
MCHC: 33.5 g/dL (ref 30.0–36.0)
MCV: 94.7 fL (ref 80.0–100.0)
Platelets: 141 10*3/uL — ABNORMAL LOW (ref 150–400)
RBC: 2.81 MIL/uL — ABNORMAL LOW (ref 3.87–5.11)
RDW: 18.8 % — ABNORMAL HIGH (ref 11.5–15.5)
WBC: 9.7 10*3/uL (ref 4.0–10.5)
nRBC: 0 % (ref 0.0–0.2)

## 2021-05-20 LAB — CBC WITH DIFFERENTIAL/PLATELET
Abs Immature Granulocytes: 0.06 10*3/uL (ref 0.00–0.07)
Basophils Absolute: 0 10*3/uL (ref 0.0–0.1)
Basophils Relative: 0 %
Eosinophils Absolute: 0.1 10*3/uL (ref 0.0–0.5)
Eosinophils Relative: 1 %
HCT: 27.3 % — ABNORMAL LOW (ref 36.0–46.0)
Hemoglobin: 8.6 g/dL — ABNORMAL LOW (ref 12.0–15.0)
Immature Granulocytes: 1 %
Lymphocytes Relative: 9 %
Lymphs Abs: 0.8 10*3/uL (ref 0.7–4.0)
MCH: 30.5 pg (ref 26.0–34.0)
MCHC: 31.5 g/dL (ref 30.0–36.0)
MCV: 96.8 fL (ref 80.0–100.0)
Monocytes Absolute: 1.2 10*3/uL — ABNORMAL HIGH (ref 0.1–1.0)
Monocytes Relative: 13 %
Neutro Abs: 6.9 10*3/uL (ref 1.7–7.7)
Neutrophils Relative %: 76 %
Platelets: 141 10*3/uL — ABNORMAL LOW (ref 150–400)
RBC: 2.82 MIL/uL — ABNORMAL LOW (ref 3.87–5.11)
RDW: 18.8 % — ABNORMAL HIGH (ref 11.5–15.5)
WBC: 9.1 10*3/uL (ref 4.0–10.5)
nRBC: 0 % (ref 0.0–0.2)

## 2021-05-20 LAB — ABO/RH: ABO/RH(D): O POS

## 2021-05-20 MED ORDER — GABAPENTIN 100 MG PO CAPS
100.0000 mg | ORAL_CAPSULE | Freq: Every day | ORAL | Status: DC
  Administered 2021-05-20 – 2021-06-12 (×21): 100 mg via ORAL
  Filled 2021-05-20 (×23): qty 1

## 2021-05-20 MED ORDER — ATORVASTATIN CALCIUM 40 MG PO TABS
40.0000 mg | ORAL_TABLET | Freq: Every day | ORAL | Status: DC
  Administered 2021-05-20 – 2021-06-13 (×15): 40 mg via ORAL
  Filled 2021-05-20 (×20): qty 1

## 2021-05-20 MED ORDER — VENLAFAXINE HCL ER 75 MG PO CP24
75.0000 mg | ORAL_CAPSULE | Freq: Every day | ORAL | Status: DC
  Administered 2021-05-22 – 2021-06-12 (×12): 75 mg via ORAL
  Filled 2021-05-20 (×25): qty 1

## 2021-05-20 MED ORDER — MIRTAZAPINE 15 MG PO TABS
7.5000 mg | ORAL_TABLET | Freq: Every day | ORAL | Status: DC
  Administered 2021-05-20 – 2021-06-13 (×23): 7.5 mg via ORAL
  Filled 2021-05-20 (×25): qty 1

## 2021-05-20 MED ORDER — DARBEPOETIN ALFA 100 MCG/0.5ML IJ SOSY
100.0000 ug | PREFILLED_SYRINGE | INTRAMUSCULAR | Status: DC
  Filled 2021-05-20: qty 0.5

## 2021-05-20 MED ORDER — FLUOXETINE HCL 20 MG PO CAPS
40.0000 mg | ORAL_CAPSULE | Freq: Every day | ORAL | Status: DC
  Administered 2021-05-20 – 2021-05-22 (×2): 40 mg via ORAL
  Filled 2021-05-20 (×2): qty 2

## 2021-05-20 MED ORDER — MIDODRINE HCL 5 MG PO TABS
10.0000 mg | ORAL_TABLET | Freq: Two times a day (BID) | ORAL | Status: DC
  Administered 2021-05-20 – 2021-05-22 (×4): 10 mg via ORAL
  Filled 2021-05-20 (×5): qty 2

## 2021-05-20 MED ORDER — CHLORHEXIDINE GLUCONATE CLOTH 2 % EX PADS: 6.0000 | MEDICATED_PAD | Freq: Every day | CUTANEOUS | Status: DC

## 2021-05-20 NOTE — Progress Notes (Addendum)
          Daily Rounding Note  05/20/2021, 12:07 PM  LOS: 0 days   SUBJECTIVE:   Chief complaint:    Blood in stool in setting of Eliquis.  ED RN reports patient is passing small to medium sized amounts of hematochezia.  No complaints of abdominal pain.  No nausea or vomiting. Currently undergoing hemodialysis.  OBJECTIVE:         Vital signs in last 24 hours:    Temp:  [98.1 F (36.7 C)-98.3 F (36.8 C)] 98.1 F (36.7 C) (07/26 0925) Pulse Rate:  [45-107] 104 (07/26 1200) Resp:  [12-26] 18 (07/26 0925) BP: (78-169)/(27-151) 101/55 (07/26 1200) SpO2:  [31 %-100 %] 95 % (07/26 0925)   Filed Weights   Pt not  re-examined.    Intake/Output from previous day: 07/25 0701 - 07/26 0700 In: 100 [IV Piggyback:100] Out: -   Intake/Output this shift: No intake/output data recorded.  Lab Results: Recent Labs    05/19/21 1835 05/20/21 0315 05/20/21 0448  WBC 9.0 9.1 9.7  HGB 9.0* 8.6* 8.9*  HCT 28.2* 27.3* 26.6*  PLT 122* 141* 141*   BMET Recent Labs    05/19/21 0920 05/20/21 0448  NA 132* 132*  K 2.8* 2.9*  CL 90* 94*  CO2 31 26  GLUCOSE 211* 190*  BUN 18 26*  CREATININE 5.30* 5.95*  CALCIUM 7.6* 7.6*   LFT Recent Labs    05/19/21 0920 05/20/21 0448  PROT 5.9* 5.7*  ALBUMIN 2.4* 2.3*  AST 57* 55*  ALT 20 25  ALKPHOS 76 68  BILITOT 0.9 0.6   PT/INR No results for input(s): LABPROT, INR in the last 72 hours. Hepatitis Panel No results for input(s): HEPBSAG, HCVAB, HEPAIGM, HEPBIGM in the last 72 hours.  Studies/Results: No results found.  ASSESMENT:      Melena, hematochezia.  Hematochezia in June, 03/26/2021 flex sig with multiple rectal ulcers (stercoral ulcers?).     Acute on chronic anemia.  Hgb 9.6 >> 8.6 >> 8.9.  No PRBC to date.       ESRD.  Hemodialysis TTS.  Chronic Eliquis, indication previous CVA.  Currently on hold.  Last dose was 7/24.  Hypokalemia.  Potassium 2.9 early this  morning.     Thrombocytopenia.  Spleen unremarkable on noncontrast CT of 03/28/2021.     Mild elevation AST, otherwise normal LFTs.  On non-contrast CT of 03/28/2021 had stable, focal steatosis at falciform ligament, no hepatic lesion.  Unremarkable gallbladder.  Unremarkable biliary tree.  PLAN      Plan EGD, possible flex sig set for 10:45 tmrw.  Npo after MN.   Need potassium of 3 or more in order to sedate patient for procedure.    Azucena Freed  05/20/2021, 12:07 PM Phone [phone]    Attending physician's note   I have taken an interval history, reviewed the chart and examined the patient. I agree with the Advanced Practitioner's note, impression and recommendations.   For EGD and FS (to eval stercoral ulcers) in AM Called pt's son [name] and left a detailed message.  Carmell Austria, MD [facility] [phone]

## 2021-05-20 NOTE — ED Notes (Signed)
Secretary paged provider.. Pt's hypokalemia not resolved even after replaced last night. Waiting for a call back.

## 2021-05-20 NOTE — ED Notes (Signed)
Coban placed on pt's IV site.

## 2021-05-20 NOTE — ED Notes (Signed)
Breakfast Order Placed ?

## 2021-05-20 NOTE — Progress Notes (Signed)
NEW ADMISSION NOTE New Admission Note:   Arrival Method: Stretcher Mental Orientation: A & O X1 Telemetry: [id number] Assessment: Completed Skin: See Flowsheets IV: WDL  Pain: 0 Safety Measures: Safety Fall Prevention Plan has been given, discussed and signed Admission: Unable to complete D/T Confusion 5 [place] Orientation: Unable to complete D/T confusion  Orders have been reviewed and implemented. Will continue to monitor the patient. Call light has been placed within reach and bed alarm has been activated.   Aneta Mins BSN, RN3

## 2021-05-20 NOTE — Progress Notes (Signed)
  [facility] Progress Note   Subjective:   Patient seen and examined at bedside in dialysis.  Alert and oriented to year/self.  Appears confused.  Talking about "putting something out." Denies CP, SOB, and n/v/d.   Objective Vitals:   05/20/21 1000 05/20/21 1012 05/20/21 1015 05/20/21 1030  BP: 101/66 (!) 78/49 (!) 111/48 138/72  Pulse: (!) 101 (!) 107 (!) 104 (!) 102  Resp:      Temp:      TempSrc:      SpO2:       Physical Exam General:chronically ill appearing, alert female in NAD, oriented to year/self, appears confused HEENT: dry mouth with dried blood on teeth Heart:RRR, no mrg Lungs:mostly CTAB anterolaterally, nml WOB on RA Abdomen:soft, NTND Extremities:trace LE edema, R sided weakness, 1+ UE edema Dialysis Access: LU AVG +b/t   Filed Weights    Intake/Output Summary (Last 24 hours) at 05/20/2021 1117 Last data filed at 05/19/2021 1810 Gross per 24 hour  Intake 100 ml  Output --  Net 100 ml    Additional Objective Labs: Basic Metabolic Panel: Recent Labs  Lab 05/19/21 0920 05/20/21 0448  NA 132* 132*  K 2.8* 2.9*  CL 90* 94*  CO2 31 26  GLUCOSE 211* 190*  BUN 18 26*  CREATININE 5.30* 5.95*  CALCIUM 7.6* 7.6*   Liver Function Tests: Recent Labs  Lab 05/19/21 0920 05/20/21 0448  AST 57* 55*  ALT 20 25  ALKPHOS 76 68  BILITOT 0.9 0.6  PROT 5.9* 5.7*  ALBUMIN 2.4* 2.3*   CBC: Recent Labs  Lab 05/19/21 0920 05/19/21 1835 05/20/21 0315 05/20/21 0448  WBC 10.5 9.0 9.1 9.7  NEUTROABS 7.5  --  6.9  --   HGB 9.6* 9.0* 8.6* 8.9*  HCT 29.6* 28.2* 27.3* 26.6*  MCV 97.4 96.9 96.8 94.7  PLT 145* 122* 141* 141*    Medications:  sodium chloride     sodium chloride     sodium chloride     pantoprazole 8 mg/hr (05/20/21 0910)    sodium chloride flush  3 mL Intravenous Q12H    Dialysis Orders: AF MWF 3h 31mn 400/500 EDW 62.5kg 3K/2.5 Ca UFP 2  AVG HeRO No heparin -Mircera 50 q 2 wks  (7/13)  Venofer 50 q wk -Parsabiv  5 Post wt 60.6kg on 7/22     Assessment/Plan: Melena/Rectal bleeding - GI consulted. Plan for EGD tomorrow.  Hypokalemia - K 2.9 today.  Repletion with KCl. Follow trends. Use added K+ bath  ESRD -  HD MWF. HD today off schedule d/t high patient census.  Will plan for HD tomorrow per regular schedule, around procedure.    LUE edema: Had f'gram 6/23 which showed chronic thrombus and was started on Eliquis  Hypertension/volume  - BP variable.  On midodrine for BP support.  Edema on exam, UF as tolerated.  Needs weights.  Anemia  - Hgb 8.9 on ESA, weekly Fe. Next ESA due tomorrow, will order increased dose.  Metabolic bone disease -  Corr Ca ok. Check phos. No binders currently  Nutrition - CL diet at present. Consider liberalized diet when tolerating PO.  Recent CVA  LJen Mow PA-C [facility] 05/20/2021,11:17 AM  LOS: 0 days

## 2021-05-20 NOTE — Progress Notes (Addendum)
PROGRESS NOTE    [name]  [id number] DOB: 1945-12-18 DOA: 05/19/2021 PCP: [name], MD (Confirm with patient/family/NH records and if not entered, this HAS to be entered at [facility] point of entry. "No PCP" if truly none.)   Chief Complaint  Patient presents with   Rectal Bleeding    Brief Narrative:    Amy [name] is a 75 y.o. 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 who presented to ER for dark stool with + fecal occult Assessment & Plan:   Principal Problem:   GI bleed Active Problems:   Essential hypertension   Type 2 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, with long-term current use of insulin (HCC)   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   ESRD on dialysis ([place])   Dyslipidemia   Hypokalemia   Stage 2 skin ulcer of sacral region ([place])   GI bleed/anemia of acute blood loss Hemoccult positive GI consulted and plan for EGD tomorrow Continue with PPI IV Transfuse to keep hemoglobin greater than 7    End-stage renal disease on dialysis Nephrology consulted and hemodialysis today   Hypomagnesemia and hypokalemia replaced    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   Type 2 diabetes mellitus with end-stage renal disease on chronic dialysis, insulin-dependent Last A1c 6.9 Diet controlled Continue with sliding scale insulin for now   Hyperlipidemia Continue with statin    Hypotension on midodrine  Stage 2 Pressure injury Pressure Injury 05/20/21 Coccyx Mid Stage 2 -  Partial thickness loss of dermis presenting as a shallow open injury with a red, pink wound bed without slough. 1X0.5X0 (Active)  05/20/21 1315  Location: Coccyx  Location Orientation: Mid  Staging: Stage 2 -  Partial thickness loss of dermis presenting as a shallow open injury with a red,  pink wound bed without slough.  Wound Description (Comments): 1X0.5X0  Present on Admission: Yes  Wound care .        DVT prophylaxis: (scd's) Code Status: (full code.  Family Communication: (none at bedside.  Disposition:   Status is: Observation  The patient will require care spanning > 2 midnights and should be moved to inpatient because: Ongoing diagnostic testing needed not appropriate for outpatient work up, Unsafe d/c plan, and IV treatments appropriate due to intensity of illness or inability to take PO  Dispo: The patient is from: Home              Anticipated d/c is to: Home              Patient currently is not medically stable to d/c.   Difficult to place patient No       Consultants:  Gastroenterology Nephrology.   Procedures: EGD scheduled for tomorrow.   Antimicrobials: none.    Subjective: Comfortable.   Objective: Vitals:   05/20/21 1200 05/20/21 1224 05/20/21 1228 05/20/21 1324  BP: (!) 101/55 (!) 106/51 (!) 147/56 (!) 129/58  Pulse: (!) 104 (!) 107 (!) 104 82  Resp:   19 18  Temp:   97.6 F (36.4 C) 98.4 F (36.9 C)  TempSrc:   Oral Oral  SpO2:   99% 100%  Height:    '5\' 7"'$  (1.702 m)    Intake/Output Summary (Last 24 hours) at 05/20/2021 1506 Last data filed at 05/20/2021 1315 Gross per  24 hour  Intake 328.67 ml  Output 500 ml  Net -171.33 ml   Filed Weights    Examination:  General exam: Appears calm and comfortable  Respiratory system: Clear to auscultation. Respiratory effort normal. Cardiovascular system: S1 & S2 heard, RRR. No JVD, No pedal edema. Gastrointestinal system: Abdomen is nondistended, soft and nontender.Normal bowel sounds heard. Central nervous system: Alert and oriented to person only, left side weakness from prior CVA. Extremities: Symmetric 5 x 5 power. Skin: No rashes, lesions or ulcers Psychiatry: . Mood & affect appropriate.     Data Reviewed: I have personally reviewed following labs and imaging  studies  CBC: Recent Labs  Lab 05/19/21 0920 05/19/21 1835 05/20/21 0315 05/20/21 0448  WBC 10.5 9.0 9.1 9.7  NEUTROABS 7.5  --  6.9  --   HGB 9.6* 9.0* 8.6* 8.9*  HCT 29.6* 28.2* 27.3* 26.6*  MCV 97.4 96.9 96.8 94.7  PLT 145* 122* 141* 141*    Basic Metabolic Panel: Recent Labs  Lab 05/19/21 0920 05/20/21 0448  NA 132* 132*  K 2.8* 2.9*  CL 90* 94*  CO2 31 26  GLUCOSE 211* 190*  BUN 18 26*  CREATININE 5.30* 5.95*  CALCIUM 7.6* 7.6*  MG 1.9  --     GFR: CrCl cannot be calculated (Unknown ideal weight.).  Liver Function Tests: Recent Labs  Lab 05/19/21 0920 05/20/21 0448  AST 57* 55*  ALT 20 25  ALKPHOS 76 68  BILITOT 0.9 0.6  PROT 5.9* 5.7*  ALBUMIN 2.4* 2.3*    CBG: No results for input(s): GLUCAP in the last 168 hours.   Recent Results (from the past 240 hour(s))  Resp Panel by RT-PCR (Flu A&B, Covid) Nasopharyngeal Swab     Status: None   Collection Time: 05/19/21 12:15 PM   Specimen: Nasopharyngeal Swab; Nasopharyngeal(NP) swabs in vial transport medium  Result Value Ref Range Status   SARS Coronavirus 2 by RT PCR NEGATIVE NEGATIVE Final    Comment: (NOTE) SARS-CoV-2 target nucleic acids are NOT DETECTED.  The SARS-CoV-2 RNA is generally detectable in upper respiratory specimens during the acute phase of infection. The lowest concentration of SARS-CoV-2 viral copies this assay can detect is 138 copies/mL. A negative result does not preclude SARS-Cov-2 infection and should not be used as the sole basis for treatment or other patient management decisions. A negative result may occur with  improper specimen collection/handling, submission of specimen other than nasopharyngeal swab, presence of viral mutation(s) within the areas targeted by this assay, and inadequate number of viral copies(<138 copies/mL). A negative result must be combined with clinical observations, patient history, and epidemiological information. The expected result is  Negative.  Fact Sheet for Patients:  EntrepreneurPulse.com.au  Fact Sheet for Healthcare Providers:  IncredibleEmployment.be  This test is no t yet approved or cleared by the [place] FDA and  has been authorized for detection and/or diagnosis of SARS-CoV-2 by FDA under an Emergency Use Authorization (EUA). This EUA will remain  in effect (meaning this test can be used) for the duration of the COVID-19 declaration under Section 564(b)(1) of the Act, 21 U.S.C.section 360bbb-3(b)(1), unless the authorization is terminated  or revoked sooner.       Influenza A by PCR NEGATIVE NEGATIVE Final   Influenza B by PCR NEGATIVE NEGATIVE Final    Comment: (NOTE) The Xpert Xpress SARS-CoV-2/FLU/RSV plus assay is intended as an aid in the diagnosis of influenza from Nasopharyngeal swab specimens and should not be used as a  sole basis for treatment. Nasal washings and aspirates are unacceptable for Xpert Xpress SARS-CoV-2/FLU/RSV testing.  Fact Sheet for Patients: EntrepreneurPulse.com.au  Fact Sheet for Healthcare Providers: IncredibleEmployment.be  This test is not yet approved or cleared by the [place] FDA and has been authorized for detection and/or diagnosis of SARS-CoV-2 by FDA under an Emergency Use Authorization (EUA). This EUA will remain in effect (meaning this test can be used) for the duration of the COVID-19 declaration under Section 564(b)(1) of the Act, 21 U.S.C. section 360bbb-3(b)(1), unless the authorization is terminated or revoked.  Performed at [facility], [place] [address], [place], [place] [address]          Radiology Studies: No results found.      Scheduled Meds:  [START ON 05/21/2021] darbepoetin (ARANESP) injection - DIALYSIS  100 mcg Intravenous Q Wed-HD   sodium chloride flush  3 mL Intravenous Q12H   Continuous Infusions:  sodium chloride     pantoprazole  8 mg/hr (05/20/21 0910)     LOS: 0 days        Hosie Poisson, MD [facility]   To contact the attending provider between 7A-7P or the covering provider during after hours 7P-7A, please log into the web site [email or web address] and access using universal [place] password for that web site. If you do not have the password, please call the hospital operator.  05/20/2021, 3:06 PM

## 2021-05-20 NOTE — H&P (View-Only) (Signed)
          Daily Rounding Note  05/20/2021, 12:07 PM  LOS: 0 days   SUBJECTIVE:   Chief complaint:    Blood in stool in setting of Eliquis.  ED RN reports patient is passing small to medium sized amounts of hematochezia.  No complaints of abdominal pain.  No nausea or vomiting. Currently undergoing hemodialysis.  OBJECTIVE:         Vital signs in last 24 hours:    Temp:  [98.1 F (36.7 C)-98.3 F (36.8 C)] 98.1 F (36.7 C) (07/26 0925) Pulse Rate:  [45-107] 104 (07/26 1200) Resp:  [12-26] 18 (07/26 0925) BP: (78-169)/(27-151) 101/55 (07/26 1200) SpO2:  [31 %-100 %] 95 % (07/26 0925)   Filed Weights   Pt not  re-examined.    Intake/Output from previous day: 07/25 0701 - 07/26 0700 In: 100 [IV Piggyback:100] Out: -   Intake/Output this shift: No intake/output data recorded.  Lab Results: Recent Labs    05/19/21 1835 05/20/21 0315 05/20/21 0448  WBC 9.0 9.1 9.7  HGB 9.0* 8.6* 8.9*  HCT 28.2* 27.3* 26.6*  PLT 122* 141* 141*   BMET Recent Labs    05/19/21 0920 05/20/21 0448  NA 132* 132*  K 2.8* 2.9*  CL 90* 94*  CO2 31 26  GLUCOSE 211* 190*  BUN 18 26*  CREATININE 5.30* 5.95*  CALCIUM 7.6* 7.6*   LFT Recent Labs    05/19/21 0920 05/20/21 0448  PROT 5.9* 5.7*  ALBUMIN 2.4* 2.3*  AST 57* 55*  ALT 20 25  ALKPHOS 76 68  BILITOT 0.9 0.6   PT/INR No results for input(s): LABPROT, INR in the last 72 hours. Hepatitis Panel No results for input(s): HEPBSAG, HCVAB, HEPAIGM, HEPBIGM in the last 72 hours.  Studies/Results: No results found.  ASSESMENT:      Melena, hematochezia.  Hematochezia in June, 03/26/2021 flex sig with multiple rectal ulcers (stercoral ulcers?).     Acute on chronic anemia.  Hgb 9.6 >> 8.6 >> 8.9.  No PRBC to date.       ESRD.  Hemodialysis TTS.  Chronic Eliquis, indication previous CVA.  Currently on hold.  Last dose was 7/24.  Hypokalemia.  Potassium 2.9 early this  morning.     Thrombocytopenia.  Spleen unremarkable on noncontrast CT of 03/28/2021.     Mild elevation AST, otherwise normal LFTs.  On non-contrast CT of 03/28/2021 had stable, focal steatosis at falciform ligament, no hepatic lesion.  Unremarkable gallbladder.  Unremarkable biliary tree.  PLAN      Plan EGD, possible flex sig set for 10:45 tmrw.  Npo after MN.   Need potassium of 3 or more in order to sedate patient for procedure.    Azucena Freed  05/20/2021, 12:07 PM Phone [phone]    Attending physician's note   I have taken an interval history, reviewed the chart and examined the patient. I agree with the Advanced Practitioner's note, impression and recommendations.   For EGD and FS (to eval stercoral ulcers) in AM Called pt's son [name] and left a detailed message.  Carmell Austria, MD [facility] [phone]

## 2021-05-20 NOTE — ED Notes (Signed)
Pt increasingly agitated.  Repeatedly saying to "get the cats off me", states she "has to go to the office to give blood" and multiple other things that are difficult to interpret.  Pt grabbing side rail with right arm and jerking it requesting it "be put down"

## 2021-05-20 NOTE — ED Notes (Signed)
Pt in room again attempting to push right leg through side rails, and repeatedly yelling to "get the cats off me Margaretha Sheffield".  Pt self d/c only IV available.  2x2 dressing applied.

## 2021-05-20 NOTE — Progress Notes (Signed)
Hemodialysis- Treatment completed without issue. UF 0.5L due to soft bps on treatment. Dr. Jonnie Finner is aware. Still with large amount bilateral upper ext edema. Protonix continued via R FA IV. Patient remains confused. Alert to self. Not mobile. Sats 100% via 3L [place]. Report called to Anisha on 55M.

## 2021-05-20 NOTE — ED Notes (Signed)
Phlebotomy unable to obtain labs at this time

## 2021-05-20 NOTE — ED Notes (Signed)
Dr. Karleen Hampshire called back. No new orders received regarding hypokalemia at this time.

## 2021-05-20 NOTE — ED Notes (Signed)
Pt to dialysis.

## 2021-05-20 NOTE — ED Notes (Signed)
Pt continues to stick extremities through side rail.  Rail padded with blankets and secured with tape.  Pt continues to talk incessantly but mostly unable to understand what she is saying other than "it's gonna storm.[name] get off the porch and go inside" and "I gotta go"

## 2021-05-20 NOTE — ED Notes (Signed)
Brief changed..the patient noted to have dark blood in brief.  Sacral wound also noted.  Pt uncooperative to brief change at this time. Completed with 2 RN

## 2021-05-21 ENCOUNTER — Encounter (HOSPITAL_COMMUNITY)
Admission: EM | Disposition: A | Discharge status: Hospice | Payer: Self-pay | Source: Skilled Nursing Facility | Attending: Internal Medicine

## 2021-05-21 ENCOUNTER — Encounter (HOSPITAL_COMMUNITY): Payer: Self-pay | Admitting: Internal Medicine

## 2021-05-21 ENCOUNTER — Inpatient Hospital Stay (HOSPITAL_COMMUNITY): Payer: Medicare Other | Admitting: Anesthesiology

## 2021-05-21 DIAGNOSIS — I10 Essential (primary) hypertension: Secondary | ICD-10-CM

## 2021-05-21 DIAGNOSIS — E785 Hyperlipidemia, unspecified: Secondary | ICD-10-CM | POA: Diagnosis not present

## 2021-05-21 DIAGNOSIS — N186 End stage renal disease: Secondary | ICD-10-CM

## 2021-05-21 DIAGNOSIS — D638 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: Secondary | ICD-10-CM | POA: Diagnosis not present

## 2021-05-21 DIAGNOSIS — R1115 Cyclical vomiting syndrome unrelated to migraine: Secondary | ICD-10-CM | POA: Diagnosis not present

## 2021-05-21 DIAGNOSIS — Z66 Do not resuscitate: Secondary | ICD-10-CM | POA: Diagnosis not present

## 2021-05-21 DIAGNOSIS — K633 Ulcer of intestine: Principal | ICD-10-CM

## 2021-05-21 DIAGNOSIS — K922 Gastrointestinal hemorrhage, unspecified: Secondary | ICD-10-CM | POA: Diagnosis not present

## 2021-05-21 DIAGNOSIS — A419 Sepsis, unspecified organism: Secondary | ICD-10-CM | POA: Diagnosis not present

## 2021-05-21 DIAGNOSIS — Z992 Dependence on renal dialysis: Secondary | ICD-10-CM

## 2021-05-21 DIAGNOSIS — Z515 Encounter for palliative care: Secondary | ICD-10-CM | POA: Diagnosis not present

## 2021-05-21 HISTORY — PX: FLEXIBLE SIGMOIDOSCOPY: SHX5431

## 2021-05-21 HISTORY — PX: ESOPHAGOGASTRODUODENOSCOPY (EGD) WITH PROPOFOL: SHX5813

## 2021-05-21 LAB — CBC
HCT: 28.5 % — ABNORMAL LOW (ref 36.0–46.0)
HCT: 25 % — ABNORMAL LOW (ref 36.0–46.0)
Hemoglobin: 9.2 g/dL — ABNORMAL LOW (ref 12.0–15.0)
Hemoglobin: 8.2 g/dL — ABNORMAL LOW (ref 12.0–15.0)
MCH: 30.5 pg (ref 26.0–34.0)
MCH: 31.4 pg (ref 26.0–34.0)
MCHC: 32.3 g/dL (ref 30.0–36.0)
MCHC: 32.8 g/dL (ref 30.0–36.0)
MCV: 94.4 fL (ref 80.0–100.0)
MCV: 95.8 fL (ref 80.0–100.0)
Platelets: 151 10*3/uL (ref 150–400)
Platelets: 173 10*3/uL (ref 150–400)
RBC: 3.02 MIL/uL — ABNORMAL LOW (ref 3.87–5.11)
RBC: 2.61 MIL/uL — ABNORMAL LOW (ref 3.87–5.11)
RDW: 18.8 % — ABNORMAL HIGH (ref 11.5–15.5)
RDW: 18.7 % — ABNORMAL HIGH (ref 11.5–15.5)
WBC: 10.6 10*3/uL — ABNORMAL HIGH (ref 4.0–10.5)
WBC: 9 10*3/uL (ref 4.0–10.5)
nRBC: 0 % (ref 0.0–0.2)
nRBC: 0 % (ref 0.0–0.2)

## 2021-05-21 LAB — GLUCOSE, CAPILLARY
Glucose-Capillary: 153 mg/dL — ABNORMAL HIGH (ref 70–99)
Glucose-Capillary: 121 mg/dL — ABNORMAL HIGH (ref 70–99)
Glucose-Capillary: 123 mg/dL — ABNORMAL HIGH (ref 70–99)

## 2021-05-21 LAB — RENAL FUNCTION PANEL
Albumin: 2.2 g/dL — ABNORMAL LOW (ref 3.5–5.0)
Albumin: 2.2 g/dL — ABNORMAL LOW (ref 3.5–5.0)
Anion gap: 12 (ref 5–15)
Anion gap: 15 (ref 5–15)
BUN: 21 mg/dL (ref 8–23)
BUN: 12 mg/dL (ref 8–23)
CO2: 24 mmol/L (ref 22–32)
CO2: 21 mmol/L — ABNORMAL LOW (ref 22–32)
Calcium: 7.6 mg/dL — ABNORMAL LOW (ref 8.9–10.3)
Calcium: 7.6 mg/dL — ABNORMAL LOW (ref 8.9–10.3)
Chloride: 94 mmol/L — ABNORMAL LOW (ref 98–111)
Chloride: 97 mmol/L — ABNORMAL LOW (ref 98–111)
Creatinine, Ser: 4.77 mg/dL — ABNORMAL HIGH (ref 0.44–1.00)
Creatinine, Ser: 3.18 mg/dL — ABNORMAL HIGH (ref 0.44–1.00)
GFR, Estimated: 9 mL/min — ABNORMAL LOW (ref >60)
GFR, Estimated: 15 mL/min — ABNORMAL LOW (ref >60)
Glucose, Bld: 121 mg/dL — ABNORMAL HIGH (ref 70–99)
Glucose, Bld: 111 mg/dL — ABNORMAL HIGH (ref 70–99)
Phosphorus: 2 mg/dL — ABNORMAL LOW (ref 2.5–4.6)
Phosphorus: 2.4 mg/dL — ABNORMAL LOW (ref 2.5–4.6)
Potassium: 2.7 mmol/L — CL (ref 3.5–5.1)
Potassium: 4.3 mmol/L (ref 3.5–5.1)
Sodium: 130 mmol/L — ABNORMAL LOW (ref 135–145)
Sodium: 133 mmol/L — ABNORMAL LOW (ref 135–145)

## 2021-05-21 LAB — BASIC METABOLIC PANEL
Anion gap: 15 (ref 5–15)
BUN: 18 mg/dL (ref 8–23)
CO2: 23 mmol/L (ref 22–32)
Calcium: 7.9 mg/dL — ABNORMAL LOW (ref 8.9–10.3)
Chloride: 97 mmol/L — ABNORMAL LOW (ref 98–111)
Creatinine, Ser: 4.32 mg/dL — ABNORMAL HIGH (ref 0.44–1.00)
GFR, Estimated: 10 mL/min — ABNORMAL LOW (ref >60)
Glucose, Bld: 160 mg/dL — ABNORMAL HIGH (ref 70–99)
Potassium: 4 mmol/L (ref 3.5–5.1)
Sodium: 135 mmol/L (ref 135–145)

## 2021-05-21 SURGERY — ESOPHAGOGASTRODUODENOSCOPY (EGD) WITH PROPOFOL
Anesthesia: Monitor Anesthesia Care

## 2021-05-21 MED ORDER — SODIUM CHLORIDE 0.9 % IV SOLN: 100.0000 mL | INTRAVENOUS | Status: DC | PRN

## 2021-05-21 MED ORDER — EPHEDRINE SULFATE-NACL 50-0.9 MG/10ML-% IV SOSY
PREFILLED_SYRINGE | INTRAVENOUS | Status: DC | PRN
  Administered 2021-05-21: 10 mg via INTRAVENOUS
  Administered 2021-05-21: 15 mg via INTRAVENOUS

## 2021-05-21 MED ORDER — PHENYLEPHRINE HCL-NACL 10-0.9 MG/250ML-% IV SOLN
INTRAVENOUS | Status: DC | PRN
  Administered 2021-05-21: 100 ug/min via INTRAVENOUS

## 2021-05-21 MED ORDER — DARBEPOETIN ALFA 100 MCG/0.5ML IJ SOSY
PREFILLED_SYRINGE | INTRAMUSCULAR | Status: AC
  Administered 2021-05-21: 100 ug via INTRAVENOUS
  Filled 2021-05-21: qty 0.5

## 2021-05-21 MED ORDER — LIDOCAINE HCL (PF) 1 % IJ SOLN: 5.0000 mL | INTRAMUSCULAR | Status: DC | PRN

## 2021-05-21 MED ORDER — PENTAFLUOROPROP-TETRAFLUOROETH EX AERO: 1.0000 "application " | INHALATION_SPRAY | CUTANEOUS | Status: DC | PRN

## 2021-05-21 MED ORDER — SODIUM CHLORIDE 0.9 % IV SOLN
INTRAVENOUS | Status: AC | PRN
  Administered 2021-05-21: 500 mL via INTRAVENOUS

## 2021-05-21 MED ORDER — LIDOCAINE-PRILOCAINE 2.5-2.5 % EX CREA: 1.0000 "application " | TOPICAL_CREAM | CUTANEOUS | Status: DC | PRN

## 2021-05-21 MED ORDER — PROPOFOL 10 MG/ML IV BOLUS
INTRAVENOUS | Status: DC | PRN
  Administered 2021-05-21: 10 mg via INTRAVENOUS
  Administered 2021-05-21: 20 mg via INTRAVENOUS

## 2021-05-21 MED ORDER — HEPARIN SODIUM (PORCINE) 1000 UNIT/ML DIALYSIS: 1000.0000 [IU] | INTRAMUSCULAR | Status: DC | PRN

## 2021-05-21 MED ORDER — ALTEPLASE 2 MG IJ SOLR: 2.0000 mg | Freq: Once | INTRAMUSCULAR | Status: DC | PRN

## 2021-05-21 MED ORDER — PROPOFOL 500 MG/50ML IV EMUL
INTRAVENOUS | Status: DC | PRN
  Administered 2021-05-21: 50 ug/kg/min via INTRAVENOUS

## 2021-05-21 MED ORDER — PHENYLEPHRINE 40 MCG/ML (10ML) SYRINGE FOR IV PUSH (FOR BLOOD PRESSURE SUPPORT)
PREFILLED_SYRINGE | INTRAVENOUS | Status: DC | PRN
  Administered 2021-05-21: 120 ug via INTRAVENOUS
  Administered 2021-05-21: 80 ug via INTRAVENOUS
  Administered 2021-05-21: 120 ug via INTRAVENOUS

## 2021-05-21 SURGICAL SUPPLY — 15 items

## 2021-05-21 NOTE — Progress Notes (Signed)
Palliative Medicine RN Note: Consult order rec'd for [place] w note that son will be here at 2. I called RN Gwenlyn Perking and let him know that we do not have an available provider at that time & to please let the son know we will call him when we do have someone available.  Marjie Skiff Gennavieve Huq, RN, BSN, [facility] Palliative Medicine Team 05/21/2021 2:41 PM Office [phone]

## 2021-05-21 NOTE — Progress Notes (Signed)
PROGRESS NOTE    Amy [name]  [id number] DOB: 1945-11-19 DOA: 05/19/2021 PCP: [name], MD (Confirm with patient/family/NH records and if not entered, this HAS to be entered at [facility] point of entry. "No PCP" if truly none.)   Chief Complaint  Patient presents with   Rectal Bleeding    Brief Narrative:    Amy [name] is a 75 y.o. 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)   GI bleed/anemia of acute blood loss Hemoccult positive GI consulted and  she underwent flex sigmoidoscopy, showing Mucosal ulceration c/w Stercoral ulcers. Non bleeding.. she will need colonoscopy.  Continue with PPI IV Transfuse to keep hemoglobin greater than 7. Hemoglobin stable around 9.    End-stage renal disease on dialysis Nephrology consulted and HD as per nehrology.    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   Type 2 diabetes mellitus with end-stage renal disease on chronic dialysis, insulin-dependent Last A1c 6.9 Diet controlled Continue with sliding scale insulin for now CBG (last 3)  Recent Labs    05/21/21 0107 05/21/21 0637  GLUCAP 153* 121*     Hyperlipidemia Continue with statin    Hypotension on midodrine  Stage 2 Pressure injury Pressure Injury 05/20/21 Coccyx Mid Stage 2 -  Partial thickness loss of dermis presenting as a shallow open injury with a  red, pink wound bed without slough. 1X0.5X0 (Active)  05/20/21 1315  Location: Coccyx  Location Orientation: Mid  Staging: Stage 2 -  Partial thickness loss of dermis presenting as a shallow open injury with a red, pink wound bed without slough.  Wound Description (Comments): 1X0.5X0  Present on Admission: Yes  Wound care .        DVT prophylaxis: (scd's) Code Status: (full code.  Family Communication: (none at bedside. ) called son, unable to reach him.  Disposition:   Status is: Observation  The patient will require care spanning > 2 midnights and should be moved to inpatient because: Ongoing diagnostic testing needed not appropriate for outpatient work up, Unsafe d/c plan, and IV treatments appropriate due to intensity of illness or inability to take PO  Dispo: The patient is from: Home              Anticipated d/c is to: Home              Patient currently is not medically stable to d/c.   Difficult to place patient No       Consultants:  Gastroenterology Nephrology.   Procedures: EGD scheduled for tomorrow.   Antimicrobials: none.    Subjective: Confused, but comfortable.   Objective: Vitals:   05/21/21 1022 05/21/21 1248 05/21/21 1300 05/21/21 1310  BP: (!) 127/52 (!) 110/93 (!) 127/50   Pulse: 82 86    Resp: 10 (!) 9 (!) 8   Temp: (!) 97 F (  36.1 C) 98.4 F (36.9 C)    TempSrc: Temporal Temporal    SpO2: 100% 100% 100% 100%  Weight:      Height:        Intake/Output Summary (Last 24 hours) at 05/21/2021 1345 Last data filed at 05/21/2021 1245 Gross per 24 hour  Intake 480.93 ml  Output 0 ml  Net 480.93 ml    Filed Weights   05/21/21 0530  Weight: 58.1 kg    Examination:  General exam: Appears calm and comfortable  Respiratory system:air entry fair, no wheezing heard.  Cardiovascular system: RRR. NO JVD,no murmer.  Gastrointestinal system: Abdomen is soft, non tender non distended bowel sounds wnl.  Central nervous system: alert,  confused.  Extremities: no cyanosis.  Skin: No rashes seen.  Psychiatry: Mood is appropriate.     Data Reviewed: I have personally reviewed following labs and imaging studies  CBC: Recent Labs  Lab 05/19/21 0920 05/19/21 1835 05/20/21 0315 05/20/21 0448 05/20/21 2340  WBC 10.5 9.0 9.1 9.7 10.6*  NEUTROABS 7.5  --  6.9  --   --   HGB 9.6* 9.0* 8.6* 8.9* 9.2*  HCT 29.6* 28.2* 27.3* 26.6* 28.5*  MCV 97.4 96.9 96.8 94.7 94.4  PLT 145* 122* 141* 141* 151     Basic Metabolic Panel: Recent Labs  Lab 05/19/21 0920 05/20/21 0448 05/20/21 2340  NA 132* 132* 135  K 2.8* 2.9* 4.0  CL 90* 94* 97*  CO2 '31 26 23  '$ GLUCOSE 211* 190* 160*  BUN 18 26* 18  CREATININE 5.30* 5.95* 4.32*  CALCIUM 7.6* 7.6* 7.9*  MG 1.9  --   --      GFR: Estimated Creatinine Clearance: 10.6 mL/min (A) (by C-G formula based on SCr of 4.32 mg/dL (H)).  Liver Function Tests: Recent Labs  Lab 05/19/21 0920 05/20/21 0448  AST 57* 55*  ALT 20 25  ALKPHOS 76 68  BILITOT 0.9 0.6  PROT 5.9* 5.7*  ALBUMIN 2.4* 2.3*     CBG: Recent Labs  Lab 05/21/21 0107 05/21/21 0637  GLUCAP 153* 121*     Recent Results (from the past 240 hour(s))  Resp Panel by RT-PCR (Flu A&B, Covid) Nasopharyngeal Swab     Status: None   Collection Time: 05/19/21 12:15 PM   Specimen: Nasopharyngeal Swab; Nasopharyngeal(NP) swabs in vial transport medium  Result Value Ref Range Status   SARS Coronavirus 2 by RT PCR NEGATIVE NEGATIVE Final    Comment: (NOTE) SARS-CoV-2 target nucleic acids are NOT DETECTED.  The SARS-CoV-2 RNA is generally detectable in upper respiratory specimens during the acute phase of infection. The lowest concentration of SARS-CoV-2 viral copies this assay can detect is 138 copies/mL. A negative result does not preclude SARS-Cov-2 infection and should not be used as the sole basis for treatment or other patient management decisions. A negative result may occur with  improper specimen  collection/handling, submission of specimen other than nasopharyngeal swab, presence of viral mutation(s) within the areas targeted by this assay, and inadequate number of viral copies(<138 copies/mL). A negative result must be combined with clinical observations, patient history, and epidemiological information. The expected result is Negative.  Fact Sheet for Patients:  EntrepreneurPulse.com.au  Fact Sheet for Healthcare Providers:  IncredibleEmployment.be  This test is no t yet approved or cleared by the [place] FDA and  has been authorized for detection and/or diagnosis of SARS-CoV-2 by FDA under an Emergency Use Authorization (EUA). This EUA will remain  in effect (meaning this test  can be used) for the duration of the COVID-19 declaration under Section 564(b)(1) of the Act, 21 U.S.C.section 360bbb-3(b)(1), unless the authorization is terminated  or revoked sooner.       Influenza A by PCR NEGATIVE NEGATIVE Final   Influenza B by PCR NEGATIVE NEGATIVE Final    Comment: (NOTE) The Xpert Xpress SARS-CoV-2/FLU/RSV plus assay is intended as an aid in the diagnosis of influenza from Nasopharyngeal swab specimens and should not be used as a sole basis for treatment. Nasal washings and aspirates are unacceptable for Xpert Xpress SARS-CoV-2/FLU/RSV testing.  Fact Sheet for Patients: EntrepreneurPulse.com.au  Fact Sheet for Healthcare Providers: IncredibleEmployment.be  This test is not yet approved or cleared by the [place] FDA and has been authorized for detection and/or diagnosis of SARS-CoV-2 by FDA under an Emergency Use Authorization (EUA). This EUA will remain in effect (meaning this test can be used) for the duration of the COVID-19 declaration under Section 564(b)(1) of the Act, 21 U.S.C. section 360bbb-3(b)(1), unless the authorization is terminated or revoked.  Performed at [facility], [place] [address], [place], [place] [address]           Radiology Studies: No results found.      Scheduled Meds:  atorvastatin  40 mg Oral Daily   darbepoetin (ARANESP) injection - DIALYSIS  100 mcg Intravenous Q Wed-HD   FLUoxetine  40 mg Oral Daily   gabapentin  100 mg Oral QHS   midodrine  10 mg Oral BID WC   mirtazapine  7.5 mg Oral QHS   sodium chloride flush  3 mL Intravenous Q12H   venlafaxine XR  75 mg Oral Q breakfast   Continuous Infusions:  sodium chloride     pantoprazole Stopped (05/21/21 1033)     LOS: 1 day        Amy Poisson, MD [facility]   To contact the attending provider between 7A-7P or the covering provider during after hours 7P-7A, please log into the web site [email or web address] and access using universal [place] password for that web site. If you do not have the password, please call the hospital operator.  05/21/2021, 1:45 PM

## 2021-05-21 NOTE — Op Note (Signed)
[facility] Patient Name: Amy [name] Procedure Date : 05/21/2021 MRN: [id number] Attending MD: Jackquline Denmark , MD Date of Birth: [birth year] CSN: [id number] Age: 75 Admit Type: Inpatient Procedure:                Flexible Sigmoidoscopy Indications:              Hematochezia Providers:                Jackquline Denmark, MD, Terrall Laity Referring MD:              Medicines:                Monitored Anesthesia Care Complications:            No immediate complications. Estimated Blood Loss:     Estimated blood loss: none. Procedure:                Pre-Anesthesia Assessment:                           - Prior to the procedure, a History and Physical                            was performed, and patient medications and                            allergies were reviewed. The patient's tolerance of                            previous anesthesia was also reviewed. The risks                            and benefits of the procedure and the sedation                            options and risks were discussed with the patient.                            All questions were answered, and informed consent                            was obtained. Prior Anticoagulants: The patient has                            taken Eliquis (apixaban), last dose was 2 days                            prior to procedure. ASA Grade Assessment: IV - A                            patient with severe systemic disease that is a                            constant threat to life. After reviewing the risks  and benefits, the patient was deemed in                            satisfactory condition to undergo the procedure.                           After obtaining informed consent, the scope was                            passed under direct vision. The [id number]-[id number]                            [id number]) Olympus colonoscope was introduced                            through the anus and advanced  to the distal sigmoid                            colon. The flexible sigmoidoscopy was accomplished                            without difficulty. The quality of the bowel                            preparation was inadequate. Scope In: Scope Out: Findings:      Nonbleeding 4 ulcers (35m - 1 cm) with clean whitish base were noted in       the distal rectum c/w stercoral ulcers. There were blood clots along       with solid stool noted up to 25 cm limiting visualization. Impression:               - Preparation of the colon was inadequate.                           - Mucosal ulceration c/w Stercoral ulcers. Non                            bleeding.                           - No specimens collected. Moderate Sedation:      none Recommendation:           - Return patient to hospital ward for ongoing care.                           - Clear liquid diet today.                           - if further eval is needed, she would require                            colonoscopy. She would need NG tube for                            preparation. It certainly  would be difficult for                            her given her condition.                           - I have tried calling patient's son but there was                            no answer.                           - ? Need for Eliquis. ? need for colonoscopy. Will                            discuss with nephrology. Procedure Code(s):        --- Professional ---                           [phone], Sigmoidoscopy, flexible; diagnostic,                            including collection of specimen(s) by brushing or                            washing, when performed (separate procedure) Diagnosis Code(s):        --- Professional ---                           K63.3, Ulcer of intestine CPT copyright 2019 American Medical Association. All rights reserved. The codes documented in this report are preliminary and upon coder review may  be revised to meet  current compliance requirements. Jackquline Denmark, MD 05/21/2021 12:55:30 PM This report has been signed electronically. Number of Addenda: 0

## 2021-05-21 NOTE — Anesthesia Procedure Notes (Signed)
Procedure Name: MAC Date/Time: 05/21/2021 12:02 PM Performed by: Moshe Salisbury, CRNA Pre-anesthesia Checklist: Patient identified, Emergency Drugs available, Suction available and Patient being monitored Patient Re-evaluated:Patient Re-evaluated prior to induction Oxygen Delivery Method: Nasal cannula Placement Confirmation: positive ETCO2 Dental Injury: Teeth and Oropharynx as per pre-operative assessment

## 2021-05-21 NOTE — Interval H&P Note (Signed)
History and Physical Interval Note:  05/21/2021 11:47 AM  Amy [name]  has presented today for surgery, with the diagnosis of gastrointestinal bleeding.  The various methods of treatment have been discussed with the patient and family. After consideration of risks, benefits and other options for treatment, the patient has consented to  Procedure(s): ESOPHAGOGASTRODUODENOSCOPY (EGD) WITH PROPOFOL (N/A) FLEXIBLE SIGMOIDOSCOPY (N/A) as a surgical intervention.  The patient's history has been reviewed, patient examined, no change in status, stable for surgery.  I have reviewed the patient's chart and labs.  Questions were answered to the patient's satisfaction.     Jackquline Denmark

## 2021-05-21 NOTE — Transfer of Care (Signed)
Immediate Anesthesia Transfer of Care Note  Patient: Amy [name]  Procedure(s) Performed: ESOPHAGOGASTRODUODENOSCOPY (EGD) WITH PROPOFOL FLEXIBLE SIGMOIDOSCOPY  Patient Location: Endoscopy Unit  Anesthesia Type:MAC  Level of Consciousness: drowsy and patient cooperative  Airway & Oxygen Therapy: Patient Spontanous Breathing and Patient connected to nasal cannula oxygen  Post-op Assessment: Report given to RN and Post -op Vital signs reviewed and stable  Post vital signs: Reviewed and stable  Last Vitals:  Vitals Value Taken Time  BP    Temp    Pulse    Resp 9 05/21/21 1248  SpO2    Vitals shown include unvalidated device data.  Last Pain:  Vitals:   05/21/21 1022  TempSrc: Temporal  PainSc:          Complications: No notable events documented.

## 2021-05-21 NOTE — Progress Notes (Signed)
Received call from lab,patient's K+2.7. Patient just came back from HD. Notified Shalhoub,MD via secure chat.Orders received.

## 2021-05-21 NOTE — Plan of Care (Signed)
  Problem: Coping: Goal: Level of anxiety will decrease Outcome: Not Progressing   

## 2021-05-21 NOTE — Anesthesia Preprocedure Evaluation (Addendum)
Anesthesia Evaluation  Patient identified by MRN, date of birth, ID band Patient confused    Reviewed: Allergy & Precautions, NPO status , Patient's Chart, lab work & pertinent test results  Airway       Comment: Unable to fully assess due to patient's mental status Dental  (+) Edentulous Lower, Edentulous Upper   Pulmonary former smoker,    breath sounds clear to auscultation       Cardiovascular Exercise Tolerance: Poor hypertension,  Rhythm:Regular Rate:Normal     Neuro/Psych Dementia CVA    GI/Hepatic negative GI ROS,   Endo/Other  diabetes, Poorly Controlled, Type 2  Renal/GU ESRF and DialysisRenal disease  negative genitourinary   Musculoskeletal  (+) Arthritis ,   Abdominal   Peds  Hematology  (+) anemia ,   Anesthesia Other Findings   Reproductive/Obstetrics negative OB ROS                            Anesthesia Physical Anesthesia Plan  ASA: 4  Anesthesia Plan: MAC   Post-op Pain Management:    Induction: Intravenous  PONV Risk Score and Plan: 2 and TIVA, Propofol infusion and Treatment may vary due to age or medical condition  Airway Management Planned: Natural Airway and Nasal Cannula  Additional Equipment: None  Intra-op Plan:   Post-operative Plan:   Informed Consent: I have reviewed the patients History and Physical, chart, labs and discussed the procedure including the risks, benefits and alternatives for the proposed anesthesia with the patient or authorized representative who has indicated his/her understanding and acceptance.     Consent reviewed with POA  Plan Discussed with: CRNA and Anesthesiologist  Anesthesia Plan Comments: (Patient alert to self only. Consented her son [name] via telephone. Norton Blizzard, MD  )       Anesthesia Quick Evaluation

## 2021-05-21 NOTE — Progress Notes (Addendum)
[facility] Progress Note   Subjective:   Patient seen and examined at bedside.  Currently getting cleaned up from following enema prior to going to procedure.  Per nursing "tap water enema given, intense red with a lot of blood clots initial return and finally clear reddish return with few small blood clots".  Patient mostly non responsive, moaning occasionally while being moved.    Objective Vitals:   05/21/21 0530 05/21/21 0535 05/21/21 0950 05/21/21 1022  BP:  (!) 152/56 (!) 143/49 (!) 127/52  Pulse:  91 90 82  Resp:  '15 16 10  '$ Temp:  (!) 97.5 F (36.4 C) (!) 97.3 F (36.3 C) (!) 97 F (36.1 C)  TempSrc:  Oral Axillary Temporal  SpO2:  100% 100% 100%  Weight: 58.1 kg     Height:       Physical Exam General:chronically ill appearing female, mostly unresponsive, laying in bed Heart:RRR, no mrg appreciated Lungs:CTAB anteriolaterally, nml WOB on RA Abdomen:soft, NTND Extremities:no LE edema, trace UE edema Dialysis Access: LU AVG +b/t   Filed Weights   05/21/21 0530  Weight: 58.1 kg    Intake/Output Summary (Last 24 hours) at 05/21/2021 1139 Last data filed at 05/21/2021 0700 Gross per 24 hour  Intake 459.6 ml  Output 500 ml  Net -40.4 ml    Additional Objective Labs: Basic Metabolic Panel: Recent Labs  Lab 05/19/21 0920 05/20/21 0448 05/20/21 2340  NA 132* 132* 135  K 2.8* 2.9* 4.0  CL 90* 94* 97*  CO2 '31 26 23  '$ GLUCOSE 211* 190* 160*  BUN 18 26* 18  CREATININE 5.30* 5.95* 4.32*  CALCIUM 7.6* 7.6* 7.9*   Liver Function Tests: Recent Labs  Lab 05/19/21 0920 05/20/21 0448  AST 57* 55*  ALT 20 25  ALKPHOS 76 68  BILITOT 0.9 0.6  PROT 5.9* 5.7*  ALBUMIN 2.4* 2.3*    CBC: Recent Labs  Lab 05/19/21 0920 05/19/21 1835 05/20/21 0315 05/20/21 0448 05/20/21 2340  WBC 10.5 9.0 9.1 9.7 10.6*  NEUTROABS 7.5  --  6.9  --   --   HGB 9.6* 9.0* 8.6* 8.9* 9.2*  HCT 29.6* 28.2* 27.3* 26.6* 28.5*  MCV 97.4 96.9 96.8 94.7 94.4  PLT 145*  122* 141* 141* 151    CBG: Recent Labs  Lab 05/21/21 0107 05/21/21 0637  GLUCAP 153* 121*     Medications:  [MAR Hold] sodium chloride     sodium chloride 500 mL (05/21/21 1033)   pantoprazole Stopped (05/21/21 1033)    [MAR Hold] atorvastatin  40 mg Oral Daily   [MAR Hold] darbepoetin (ARANESP) injection - DIALYSIS  100 mcg Intravenous Q Wed-HD   [MAR Hold] FLUoxetine  40 mg Oral Daily   [MAR Hold] gabapentin  100 mg Oral QHS   [MAR Hold] midodrine  10 mg Oral BID WC   [MAR Hold] mirtazapine  7.5 mg Oral QHS   [MAR Hold] sodium chloride flush  3 mL Intravenous Q12H   [MAR Hold] venlafaxine XR  75 mg Oral Q breakfast    Dialysis Orders: AF MWF 3h 20mn 400/500 EDW 62.5kg 3K/2.5 Ca UFP 2  AVG HeRO No heparin -Mircera 50 q 2 wks  (7/13)  Venofer 50 q wk -Parsabiv 5 Post wt 60.6kg on 7/22     Assessment/Plan: Melena/Rectal bleeding - GI consulted. Hx rectal ulcers.  Plan for EGD and possible flex sig today.   Hypokalemia - K improved to 4.0 yesterday.  Repletion with KCl. Follow trends. Use  added K+ bath  ESRD -  HD MWF. Plan for HD today post procedure to get back on schedule.  Use increased K bath.     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 Hypertension/volume  - BP variable.  On midodrine for BP support.  Edema improving, UF as tolerated.  Needs weights. Anemia  - Last Hgb 9.2 on ESA, weekly Fe. Next ESA due today, dose increased to 163mg.  Metabolic bone disease -  Corr Ca ok. Check phos. No binders currently  Nutrition - NPO at present. Consider liberalized diet when tolerating PO.  Recent CVA   LJen Mow PA-C [facility] 05/21/2021,11:39 AM  LOS: 1 day

## 2021-05-21 NOTE — Anesthesia Postprocedure Evaluation (Signed)
Anesthesia Post Note  Patient: Amy [name]  Procedure(s) Performed: ESOPHAGOGASTRODUODENOSCOPY (EGD) WITH PROPOFOL FLEXIBLE SIGMOIDOSCOPY     Patient location during evaluation: PACU Anesthesia Type: MAC Level of consciousness: awake and alert Pain management: pain level controlled Vital Signs Assessment: post-procedure vital signs reviewed and stable Respiratory status: spontaneous breathing and respiratory function stable Cardiovascular status: stable Postop Assessment: no apparent nausea or vomiting Anesthetic complications: no   No notable events documented.  Last Vitals:  Vitals:   05/21/21 1310 05/21/21 1400  BP:  (!) 137/52  Pulse:  84  Resp:  16  Temp:  (!) 36.3 C  SpO2: 100% 100%    Last Pain:  Vitals:   05/21/21 1310  TempSrc:   PainSc: 0-No pain                 Merlinda Frederick

## 2021-05-21 NOTE — Op Note (Signed)
[facility] Patient Name: Amy [name] Procedure Date : 05/21/2021 MRN: [id number] Attending MD: Jackquline Denmark , MD Date of Birth: 06-12-1946 CSN: [id number] Age: 75 Admit Type: Inpatient Procedure:                Upper GI endoscopy Indications:              N/V with melena, Suspected upper gastrointestinal                            bleeding Providers:                Jackquline Denmark, MD, Terrall Laity, Doristine Johns,                            RN, Benetta Spar, Technician Referring MD:              Medicines:                Monitored Anesthesia Care Complications:            No immediate complications. Estimated Blood Loss:     Estimated blood loss: none. Procedure:                Pre-Anesthesia Assessment:                           - Prior to the procedure, a History and Physical                            was performed, and patient medications and                            allergies were reviewed. The patient's tolerance of                            previous anesthesia was also reviewed. The risks                            and benefits of the procedure and the sedation                            options and risks were discussed with the patient.                            All questions were answered, and informed consent                            was obtained. Prior Anticoagulants: The patient has                            taken Eliquis (apixaban), last dose was 2 days                            prior to procedure. ASA Grade Assessment: IV - A  patient with severe systemic disease that is a                            constant threat to life. After reviewing the risks                            and benefits, the patient was deemed in                            satisfactory condition to undergo the procedure.                           After obtaining informed consent, the endoscope was                            passed under direct  vision. Throughout the                            procedure, the patient's blood pressure, pulse, and                            oxygen saturations were monitored continuously. The                            [id number]) Olympus endoscope was introduced                            through the mouth, and advanced to the second part                            of duodenum. The upper GI endoscopy was                            accomplished without difficulty. The patient                            tolerated the procedure well. Scope In: Scope Out: Findings:      The examined esophagus was normal with well-defined Z-line at 36 cm,       examined by NBI. No proximal esophageal lesions were noted.      The entire examined stomach was normal.      The examined duodenum was normal. Impression:               - Normal EGD. No evidence of upper GI bleeding.                           - No specimens collected. Recommendation:           - Return patient to hospital ward for ongoing care.                           - Continue present medications.                           - The findings  and recommendations were discussed                            with the patient's family.                           - Proceed with flexible sigmoidoscopy. Procedure Code(s):        --- Professional ---                           [phone], Esophagogastroduodenoscopy, flexible,                            transoral; with biopsy, single or multiple Diagnosis Code(s):        --- Professional ---                           0000000, Cyclical vomiting syndrome unrelated to                            migraine CPT copyright 2019 American Medical Association. All rights reserved. The codes documented in this report are preliminary and upon coder review may  be revised to meet current compliance requirements. Jackquline Denmark, MD 05/21/2021 12:27:22 PM This report has been signed electronically. Number of Addenda: 0

## 2021-05-21 NOTE — Progress Notes (Signed)
Tap water enema given,intense red with a lot of blood clots initial return and finally clear reddish return with few small blood clots.

## 2021-05-22 ENCOUNTER — Encounter (HOSPITAL_COMMUNITY): Payer: Self-pay | Admitting: Gastroenterology

## 2021-05-22 DIAGNOSIS — Z7189 Other specified counseling: Secondary | ICD-10-CM | POA: Diagnosis not present

## 2021-05-22 DIAGNOSIS — N186 End stage renal disease: Secondary | ICD-10-CM | POA: Diagnosis not present

## 2021-05-22 DIAGNOSIS — D638 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: Secondary | ICD-10-CM | POA: Diagnosis not present

## 2021-05-22 DIAGNOSIS — Z515 Encounter for palliative care: Secondary | ICD-10-CM

## 2021-05-22 DIAGNOSIS — K922 Gastrointestinal hemorrhage, unspecified: Secondary | ICD-10-CM | POA: Diagnosis not present

## 2021-05-22 DIAGNOSIS — R531 Weakness: Secondary | ICD-10-CM

## 2021-05-22 DIAGNOSIS — E785 Hyperlipidemia, unspecified: Secondary | ICD-10-CM | POA: Diagnosis not present

## 2021-05-22 DIAGNOSIS — R638 Other symptoms and signs concerning food and fluid intake: Secondary | ICD-10-CM

## 2021-05-22 LAB — RENAL FUNCTION PANEL
Albumin: 2.2 g/dL — ABNORMAL LOW (ref 3.5–5.0)
Anion gap: 14 (ref 5–15)
BUN: 14 mg/dL (ref 8–23)
CO2: 21 mmol/L — ABNORMAL LOW (ref 22–32)
Calcium: 7.5 mg/dL — ABNORMAL LOW (ref 8.9–10.3)
Chloride: 96 mmol/L — ABNORMAL LOW (ref 98–111)
Creatinine, Ser: 3.51 mg/dL — ABNORMAL HIGH (ref 0.44–1.00)
GFR, Estimated: 13 mL/min — ABNORMAL LOW (ref >60)
Glucose, Bld: 127 mg/dL — ABNORMAL HIGH (ref 70–99)
Phosphorus: 2.1 mg/dL — ABNORMAL LOW (ref 2.5–4.6)
Potassium: 3.9 mmol/L (ref 3.5–5.1)
Sodium: 131 mmol/L — ABNORMAL LOW (ref 135–145)

## 2021-05-22 LAB — CBC
HCT: 26.2 % — ABNORMAL LOW (ref 36.0–46.0)
Hemoglobin: 8.5 g/dL — ABNORMAL LOW (ref 12.0–15.0)
MCH: 30.6 pg (ref 26.0–34.0)
MCHC: 32.4 g/dL (ref 30.0–36.0)
MCV: 94.2 fL (ref 80.0–100.0)
Platelets: 148 10*3/uL — ABNORMAL LOW (ref 150–400)
RBC: 2.78 MIL/uL — ABNORMAL LOW (ref 3.87–5.11)
RDW: 18.7 % — ABNORMAL HIGH (ref 11.5–15.5)
WBC: 9.9 10*3/uL (ref 4.0–10.5)
nRBC: 0.8 % — ABNORMAL HIGH (ref 0.0–0.2)

## 2021-05-22 LAB — GLUCOSE, CAPILLARY
Glucose-Capillary: 126 mg/dL — ABNORMAL HIGH (ref 70–99)
Glucose-Capillary: 110 mg/dL — ABNORMAL HIGH (ref 70–99)
Glucose-Capillary: 130 mg/dL — ABNORMAL HIGH (ref 70–99)
Glucose-Capillary: 156 mg/dL — ABNORMAL HIGH (ref 70–99)

## 2021-05-22 LAB — MAGNESIUM: Magnesium: 1.9 mg/dL (ref 1.7–2.4)

## 2021-05-22 MED ORDER — PANTOPRAZOLE SODIUM 40 MG PO TBEC
40.0000 mg | DELAYED_RELEASE_TABLET | Freq: Every day | ORAL | Status: DC
  Administered 2021-05-24 – 2021-06-13 (×13): 40 mg via ORAL
  Filled 2021-05-22 (×15): qty 1

## 2021-05-22 MED ORDER — CHLORHEXIDINE GLUCONATE CLOTH 2 % EX PADS
6.0000 | MEDICATED_PAD | Freq: Every day | CUTANEOUS | Status: DC
  Administered 2021-05-25 – 2021-06-02 (×3): 6 via TOPICAL

## 2021-05-22 NOTE — Progress Notes (Signed)
[facility] Progress Note   Subjective:   Patient seen and examined in room.  Not tracking or making eye contact but does responds with 1 word answer to some questions.  Oriented x2. Believes she is at home. Denies CP, SOB and n/v/d.   Objective Vitals:   05/21/21 2038 05/22/21 0051 05/22/21 0446 05/22/21 0938  BP: 93/74 (!) 102/49 (!) 103/53 (!) 97/50  Pulse: (!) 101 94 94 97  Resp: '18 18 18 16  '$ Temp: 98 F (36.7 C) 98.3 F (36.8 C) 98.2 F (36.8 C) 98.6 F (37 C)  TempSrc:  Oral Oral Axillary  SpO2: 100% 100% 100% 100%  Weight:      Height:       Physical Exam General:chronically ill appearing female in NAD, laying in bed Heart:RRR, no mrg Lungs:mostly CTAB Abdomen:soft, NTND Extremities:no LE edema, 1+ UE edema Dialysis Access: LU AVG +b/t   Filed Weights   05/21/21 0530 05/21/21 1600 05/21/21 1928  Weight: 58.1 kg 59.5 kg 57 kg    Intake/Output Summary (Last 24 hours) at 05/22/2021 1242 Last data filed at 05/22/2021 [id number] Gross per 24 hour  Intake 250 ml  Output 2500 ml  Net -2250 ml    Additional Objective Labs: Basic Metabolic Panel: Recent Labs  Lab 05/21/21 1700 05/21/21 2145 05/22/21 0321  NA 130* 133* 131*  K 2.7* 4.3 3.9  CL 94* 97* 96*  CO2 24 21* 21*  GLUCOSE 121* 111* 127*  BUN '21 12 14  '$ CREATININE 4.77* 3.18* 3.51*  CALCIUM 7.6* 7.6* 7.5*  PHOS 2.0* 2.4* 2.1*   Liver Function Tests: Recent Labs  Lab 05/19/21 0920 05/20/21 0448 05/21/21 1700 05/21/21 2145 05/22/21 0321  AST 57* 55*  --   --   --   ALT 20 25  --   --   --   ALKPHOS 76 68  --   --   --   BILITOT 0.9 0.6  --   --   --   PROT 5.9* 5.7*  --   --   --   ALBUMIN 2.4* 2.3* 2.2* 2.2* 2.2*   No results for input(s): LIPASE, AMYLASE in the last 168 hours. CBC: Recent Labs  Lab 05/19/21 0920 05/19/21 1835 05/20/21 0315 05/20/21 0448 05/20/21 2340 05/21/21 1700 05/22/21 0321  WBC 10.5   < > 9.1 9.7 10.6* 9.0 9.9  NEUTROABS 7.5  --  6.9  --   --   --    --   HGB 9.6*   < > 8.6* 8.9* 9.2* 8.2* 8.5*  HCT 29.6*   < > 27.3* 26.6* 28.5* 25.0* 26.2*  MCV 97.4   < > 96.8 94.7 94.4 95.8 94.2  PLT 145*   < > 141* 141* 151 173 148*   < > = values in this interval not displayed.   CBG: Recent Labs  Lab 05/21/21 0107 05/21/21 0637 05/21/21 2037 05/22/21 0636 05/22/21 1204  GLUCAP 153* 121* 123* 126* 110*   Medications:  sodium chloride      atorvastatin  40 mg Oral Daily   darbepoetin (ARANESP) injection - DIALYSIS  100 mcg Intravenous Q Wed-HD   FLUoxetine  40 mg Oral Daily   gabapentin  100 mg Oral QHS   midodrine  10 mg Oral BID WC   mirtazapine  7.5 mg Oral QHS   sodium chloride flush  3 mL Intravenous Q12H   venlafaxine XR  75 mg Oral Q breakfast    Dialysis Orders: AF MWF  3h 74mn 400/500 EDW 62.5kg 3K/2.5 Ca UFP 2  AVG HeRO No heparin -Mircera 50 q 2 wks  (7/13)  Venofer 50 q wk -Parsabiv 5 Post wt 60.6kg on 7/22     Assessment/Plan: Melena/Rectal bleeding - GI consulted. Hx rectal ulcers.  EGD yesterday normal and flex sig with mucosal ulceration c/w sterocoral ulcers, non bleeding.  Will need colonoscopy if plan to continue full scope of management.  Palliative care consulted to establish goals of care.  Hypokalemia - K  3.9 today. Repletion with KCl. Follow trends. Use added K+ bath  ESRD -  HD MWF. HD tomorrow per regular schedule.  Question about patient appropriateness for OP dialysis, will need to tolerated dialysis in chair prior to d/c. Will order to be completed in chair tomorrow.    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 Hypertension/volume  - BP soft. On midodrine for BP support.  Edema improving, UF as tolerated.  Under edw if weights correct. Anemia  - Hgb trending down, last 8.5. on ESA, weekly Fe. Aranesp 1049m given with HD yesterday.  Metabolic bone disease -  Corr Ca and phos ok.  Continue to monitor. No binders currently   Nutrition - NPO at present. Consider liberalized diet when tolerating PO.  Recent CVA   LiJen MowPA-C [facility] 05/22/2021,12:42 PM  LOS: 2 days

## 2021-05-22 NOTE — Progress Notes (Addendum)
Daily Rounding Note  05/22/2021, 12:50 PM  LOS: 2 days   SUBJECTIVE:   Chief complaint:  rectal ulcers.  Painless hematochezia.    Staff report no bleeding or stools overnight or this AM.  Pt denies pain Staff was feeding pt clears but she kept spitting them out.    OBJECTIVE:         Vital signs in last 24 hours:    Temp:  [97.3 F (36.3 C)-98.7 F (37.1 C)] 98.6 F (37 C) (07/28 0938) Pulse Rate:  [67-101] 97 (07/28 0938) Resp:  [8-18] 16 (07/28 0938) BP: (93-157)/(43-94) 97/50 (07/28 0938) SpO2:  [98 %-100 %] 100 % (07/28 0938) Weight:  [57 kg-59.5 kg] 57 kg (07/27 1928) Last BM Date:  (pt unable to report) Filed Weights   05/21/21 0530 05/21/21 1600 05/21/21 1928  Weight: 58.1 kg 59.5 kg 57 kg   General: looks frail, impaired, chronically ill.  Gaping mouth Heart: RRR Chest: no labored breathing or cough Abdomen: soft, NT, ND.  BS hypoactive.  Extremities: contracted L UE.   Neuro/Psych:  alert, responds to questions.  Speech limited and difficult to understand.  Follows some simple commands.  + tremors in R hand.    Intake/Output from previous day: 07/27 0701 - 07/28 0700 In: 250 [I.V.:250] Out: 2500   Intake/Output this shift: No intake/output data recorded.  Lab Results: Recent Labs    05/20/21 2340 05/21/21 1700 05/22/21 0321  WBC 10.6* 9.0 9.9  HGB 9.2* 8.2* 8.5*  HCT 28.5* 25.0* 26.2*  PLT 151 173 148*   BMET Recent Labs    05/21/21 1700 05/21/21 2145 05/22/21 0321  NA 130* 133* 131*  K 2.7* 4.3 3.9  CL 94* 97* 96*  CO2 24 21* 21*  GLUCOSE 121* 111* 127*  BUN '21 12 14  '$ CREATININE 4.77* 3.18* 3.51*  CALCIUM 7.6* 7.6* 7.5*   LFT Recent Labs    05/20/21 0448 05/21/21 1700 05/21/21 2145 05/22/21 0321  PROT 5.7*  --   --   --   ALBUMIN 2.3* 2.2* 2.2* 2.2*  AST 55*  --   --   --   ALT 25  --   --   --   ALKPHOS 68  --   --   --   BILITOT 0.6  --   --   --    PT/INR No  results for input(s): LABPROT, INR in the last 72 hours. Hepatitis Panel No results for input(s): HEPBSAG, HCVAB, HEPAIGM, HEPBIGM in the last 72 hours.  Studies/Results: No results found.  ASSESMENT:   Painless hematochezia.   03/26/21 Flex sig, at [facility]: multiple rectal ulcers.  Path: Polypoid fragment of colorectal mucosa with crypt architectural disorder including slight hyperplastic features, mild fibromuscular hyperplasia of the lamina propria, and adherent necroinflammatory debris suggestive of ulcer exudate.  05/21/21 Flex sig: clean based, non-bleeding distal rectl ulcers (stercoral ulcer).  Blood clots and solid stool limited visualization.   05/21/21 EGD: Normal.      Chronic anemia.  No PRBCs during current admit.  Hgb 8.5.       ESRD    Chronic eliquis for chronic UE thrombus. 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       Hyponatremia.          PLAN      Pursue colonoscopy?  This would require placing NGT to administer bowel prep.  Await opinion from son    Amy [name]  05/22/2021, 12:50 PM Phone [phone]     Attending physician's note   I have reviewed the chart and examined the patient. I agree with the Advanced Practitioner's note, impression and recommendations.   I have discussed with patient's son [name] in detail.  No further bleeding.  He would like to hold off on colonoscopy.  Prep would be much harder as it will require NG tube.  Certainly, can be considered in future if she has any active bleeding.  Trend CBC Son would like to consider short-term Dobbhoff/coretrac for feeding.  He will discuss tomorrow with hospitalist service. For constipation, would recommend starting MiraLAX 17 g p.o. once a day. GI will sign off for now Please call if with any questions   Carmell Austria, MD [facility] [phone]

## 2021-05-22 NOTE — Progress Notes (Signed)
PROGRESS NOTE    Amy [name]  [id number] DOB: September 14, 1946 DOA: 05/19/2021 PCP: [name], MD    Chief Complaint  Patient presents with   Rectal Bleeding    Brief Narrative:    Amy [name] is a 75 y.o. 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 who presented to ER for dark stool with + fecal occult Assessment & Plan:   Principal Problem:   GI bleed Active Problems:   Essential hypertension   Type 2 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, with long-term current use of insulin (HCC)   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   ESRD on dialysis ([place])   Dyslipidemia   Hypokalemia   Stage 2 skin ulcer of sacral region ([place])   GI bleed/anemia of acute blood loss Hemoccult positive GI consulted and  she underwent , EGD wnl, and flex sigmoidoscopy, showing Mucosal ulceration c/w Stercoral ulcers. Non bleeding.. she will need colonoscopy. Son still thinking about it. Called him today ad he said he will let us know later today.  Transfuse to keep hemoglobin greater than 7. Hemoglobin around 8.5.   End-stage renal disease on dialysis Nephrology consulted and HD as per nehrology.    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   Type 2 diabetes mellitus with end-stage renal disease on chronic dialysis, insulin-dependent Last A1c 6.9 Diet controlled Continue with sliding scale insulin for now CBG (last 3)  Recent Labs    05/21/21 2037 05/22/21 0636 05/22/21 1204  GLUCAP 123* 126* 110*   Minimal oral intake.    Hyperlipidemia Continue with statin    Hypotension on midodrine  Stage 2 Pressure injury Pressure Injury 05/20/21 Coccyx Mid Stage 2 -  Partial thickness loss of dermis presenting as a shallow open injury with a red, pink  wound bed without slough. 1X0.5X0 (Active)  05/20/21 1315  Location: Coccyx  Location Orientation: Mid  Staging: Stage 2 -  Partial thickness loss of dermis presenting as a shallow open injury with a red, pink wound bed without slough.  Wound Description (Comments): 1X0.5X0  Present on Admission: Yes  Wound care .        DVT prophylaxis: (scd's) Code Status: (full code.  Family Communication: (none at bedside. ) called son today and updated.  Disposition:   Status is: Observation  The patient will require care spanning > 2 midnights and should be moved to inpatient because: Ongoing diagnostic testing needed not appropriate for outpatient work up, Unsafe d/c plan, and IV treatments appropriate due to intensity of illness or inability to take PO  Dispo: The patient is from: Home              Anticipated d/c is to: Home              Patient currently is not medically stable to d/c.   Difficult to place patient No       Consultants:  Gastroenterology Nephrology.   Procedures: EGD,  Antimicrobials: none.    Subjective: Alert, and appears comfortable.   Objective: Vitals:   05/21/21 2038 05/22/21 0051 05/22/21 0446 05/22/21 0938  BP: 93/74 (!) 102/49 (!) 103/53 (!) 97/50  Pulse: (!) 101 94 94 97  Resp: '18 18 18 16  '$ Temp: 98 F (36.7 C) 98.3 F (36.8 C)  98.2 F (36.8 C) 98.6 F (37 C)  TempSrc:  Oral Oral Axillary  SpO2: 100% 100% 100% 100%  Weight:      Height:        Intake/Output Summary (Last 24 hours) at 05/22/2021 1445 Last data filed at 05/22/2021 1100 Gross per 24 hour  Intake 0 ml  Output 2500 ml  Net -2500 ml    Filed Weights   05/21/21 0530 05/21/21 1600 05/21/21 1928  Weight: 58.1 kg 59.5 kg 57 kg    Examination:  General exam: elderly lady, ill appearing, blind.  Respiratory system: air entry fair, no wheezing heard.  Cardiovascular system: RRR, no JVD, no pedal edema.  Gastrointestinal system: Abdomen is soft, NT ND BS+ Central  nervous system: alert, able to follow  simple commands , contracted left upper extremity.  Extremities: No cyanosis.  Skin: No rashes seen.  Psychiatry: cannot be assessed.     Data Reviewed: I have personally reviewed following labs and imaging studies  CBC: Recent Labs  Lab 05/19/21 0920 05/19/21 1835 05/20/21 0315 05/20/21 0448 05/20/21 2340 05/21/21 1700 05/22/21 0321  WBC 10.5   < > 9.1 9.7 10.6* 9.0 9.9  NEUTROABS 7.5  --  6.9  --   --   --   --   HGB 9.6*   < > 8.6* 8.9* 9.2* 8.2* 8.5*  HCT 29.6*   < > 27.3* 26.6* 28.5* 25.0* 26.2*  MCV 97.4   < > 96.8 94.7 94.4 95.8 94.2  PLT 145*   < > 141* 141* 151 173 148*   < > = values in this interval not displayed.     Basic Metabolic Panel: Recent Labs  Lab 05/19/21 0920 05/20/21 0448 05/20/21 2340 05/21/21 1700 05/21/21 2145 05/22/21 0321  NA 132* 132* 135 130* 133* 131*  K 2.8* 2.9* 4.0 2.7* 4.3 3.9  CL 90* 94* 97* 94* 97* 96*  CO2 '31 26 23 24 '$ 21* 21*  GLUCOSE 211* 190* 160* 121* 111* 127*  BUN 18 26* '18 21 12 14  '$ CREATININE 5.30* 5.95* 4.32* 4.77* 3.18* 3.51*  CALCIUM 7.6* 7.6* 7.9* 7.6* 7.6* 7.5*  MG 1.9  --   --   --   --  1.9  PHOS  --   --   --  2.0* 2.4* 2.1*     GFR: Estimated Creatinine Clearance: 12.8 mL/min (A) (by C-G formula based on SCr of 3.51 mg/dL (H)).  Liver Function Tests: Recent Labs  Lab 05/19/21 0920 05/20/21 0448 05/21/21 1700 05/21/21 2145 05/22/21 0321  AST 57* 55*  --   --   --   ALT 20 25  --   --   --   ALKPHOS 76 68  --   --   --   BILITOT 0.9 0.6  --   --   --   PROT 5.9* 5.7*  --   --   --   ALBUMIN 2.4* 2.3* 2.2* 2.2* 2.2*     CBG: Recent Labs  Lab 05/21/21 0107 05/21/21 0637 05/21/21 2037 05/22/21 0636 05/22/21 1204  GLUCAP 153* 121* 123* 126* 110*      Recent Results (from the past 240 hour(s))  Resp Panel by RT-PCR (Flu A&B, Covid) Nasopharyngeal Swab     Status: None   Collection Time: 05/19/21 12:15 PM   Specimen: Nasopharyngeal Swab;  Nasopharyngeal(NP) swabs in vial transport medium  Result Value Ref Range Status   SARS Coronavirus 2 by RT PCR NEGATIVE NEGATIVE Final  Comment: (NOTE) SARS-CoV-2 target nucleic acids are NOT DETECTED.  The SARS-CoV-2 RNA is generally detectable in upper respiratory specimens during the acute phase of infection. The lowest concentration of SARS-CoV-2 viral copies this assay can detect is 138 copies/mL. A negative result does not preclude SARS-Cov-2 infection and should not be used as the sole basis for treatment or other patient management decisions. A negative result may occur with  improper specimen collection/handling, submission of specimen other than nasopharyngeal swab, presence of viral mutation(s) within the areas targeted by this assay, and inadequate number of viral copies(<138 copies/mL). A negative result must be combined with clinical observations, patient history, and epidemiological information. The expected result is Negative.  Fact Sheet for Patients:  EntrepreneurPulse.com.au  Fact Sheet for Healthcare Providers:  IncredibleEmployment.be  This test is no t yet approved or cleared by the [place] FDA and  has been authorized for detection and/or diagnosis of SARS-CoV-2 by FDA under an Emergency Use Authorization (EUA). This EUA will remain  in effect (meaning this test can be used) for the duration of the COVID-19 declaration under Section 564(b)(1) of the Act, 21 U.S.C.section 360bbb-3(b)(1), unless the authorization is terminated  or revoked sooner.       Influenza A by PCR NEGATIVE NEGATIVE Final   Influenza B by PCR NEGATIVE NEGATIVE Final    Comment: (NOTE) The Xpert Xpress SARS-CoV-2/FLU/RSV plus assay is intended as an aid in the diagnosis of influenza from Nasopharyngeal swab specimens and should not be used as a sole basis for treatment. Nasal washings and aspirates are unacceptable for Xpert Xpress  SARS-CoV-2/FLU/RSV testing.  Fact Sheet for Patients: EntrepreneurPulse.com.au  Fact Sheet for Healthcare Providers: IncredibleEmployment.be  This test is not yet approved or cleared by the [place] FDA and has been authorized for detection and/or diagnosis of SARS-CoV-2 by FDA under an Emergency Use Authorization (EUA). This EUA will remain in effect (meaning this test can be used) for the duration of the COVID-19 declaration under Section 564(b)(1) of the Act, 21 U.S.C. section 360bbb-3(b)(1), unless the authorization is terminated or revoked.  Performed at [facility], [place] [address] [place], [place] [address]           Radiology Studies: No results found.      Scheduled Meds:  atorvastatin  40 mg Oral Daily   [START ON 05/23/2021] Chlorhexidine Gluconate Cloth  6 each Topical Q0600   darbepoetin (ARANESP) injection - DIALYSIS  100 mcg Intravenous Q Wed-HD   FLUoxetine  40 mg Oral Daily   gabapentin  100 mg Oral QHS   midodrine  10 mg Oral BID WC   mirtazapine  7.5 mg Oral QHS   sodium chloride flush  3 mL Intravenous Q12H   venlafaxine XR  75 mg Oral Q breakfast   Continuous Infusions:  sodium chloride       LOS: 2 days        Hosie Poisson, MD [facility]   To contact the attending provider between 7A-7P or the covering provider during after hours 7P-7A, please log into the web site [email or web address] and access using universal [place] password for that web site. If you do not have the password, please call the hospital operator.  05/22/2021, 2:45 PM

## 2021-05-22 NOTE — Consult Note (Signed)
Consultation Note Date: 05/22/2021   Patient Name: Amy [name]  DOB: 01-22-1946  MRN: [id number]  Age / Sex: 75 y.o., female  PCP: [name], MD Referring Physician: Hosie Poisson, MD  Reason for Consultation: Establishing goals of care  HPI/Patient Profile: 75 y.o. 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  03/26/21 Flex sig, at [facility]: multiple rectal ulcers.  Path: Polypoid fragment of colorectal mucosa with crypt architectural disorder including slight hyperplastic features, mild fibromuscular hyperplasia of the lamina propria, and adherent necroinflammatory debris suggestive of ulcer exudate. 05/21/21 Flex sig: clean based, non-bleeding distal rectl ulcers (stercoral ulcer).  Blood clots and solid stool limited visualization.   05/21/21 EGD: Normal.    Patient is confused, unable to follow commands with poor p.o. intake  According to her family she has had continue physical, fucntional and cognitive decliene over the past several months but dramatic decline in th e past 35 days.    Family face treatment option decisions, advanced directive decisions and anticipatory care needs.    Clinical Assessment and Goals of Care:  This NP Wadie Lessen reviewed medical records, received report from team, assessed the patient and then meet at the patient's bedside along with her son [name] and close friend/family [name] ( she was on phone)  to discuss diagnosis, prognosis, [place], EOL wishes disposition and options.   Concept of Palliative Care was introduced as specialized medical care for people and their families living with serious illness.  If focuses on providing relief from the symptoms and stress of a serious illness.  The goal is to improve quality of life  for both the patient and the family.   Values and goals of care important to patient and family were attempted to be elicited.   Created space and opportunity for patient  and family to explore thoughts and feelings regarding current medical situation.  Son is struggling with the patient's continued failure to thrive and with advance care planning decisions specific to CODE STATUS and artificial feeding and hydration.   Education offered human mortality ,  adult failure to thrive and the limitations of medical interventions to prolong quality of life when a body fails to thrive.   Education offered on risks and benefits of artifical  feeding/PEG   A  discussion was had today regarding advanced directives.  Concepts specific to code status, artifical feeding and hydration, continued IV antibiotics and rehospitalization was had.    The difference between a aggressive medical intervention path  and a palliative comfort care path for this patient at this time was had.   MOST form introduced, and left for review along with a Hard Choices booklet    Natural trajectory and expectations at EOL were discussed.  Questions and concerns addressed.  Patient  encouraged to call with questions or concerns.  PMT will continue to support holistically.         NEXT OF KIN    SUMMARY OF RECOMMENDATIONS    Code Status/Advance Care Planning: Full code Encouraged patient/family to consider DNR/DNI status understanding evidenced based poor outcomes in similar hospitalized patient, as the cause of arrest is likely associated with advanced chronic illness rather than an easily reversible acute cardio-pulmonary event.    Symptom Management:  Per attending   Palliative Prophylaxis:  Aspiration, Bowel Regimen, Delirium Protocol, Frequent Pain Assessment, and Oral Care  Additional Recommendations (Limitations, Scope, Preferences): Full Scope Treatment  Psycho-social/Spiritual:  Desire for  further Chaplaincy support:yes Additional Recommendations: Education on Hospice  Prognosis:  Unable to determine  Discharge Planning: To Be Determined      Primary Diagnoses: Present on Admission:  Dyslipidemia  Essential hypertension  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  (Resolved) Anaphylactic shock, unspecified, initial encounter  Hypokalemia  GI bleed  Stage 2 skin ulcer of sacral region [facility])   I have reviewed the medical record, interviewed the patient and family, and examined the patient. The following aspects are pertinent.  Past Medical History:  Diagnosis Date   Chronic kidney disease    dialysis M-W-F - dialysis cath located in right side of chest   Diabetes mellitus without complication (HCC)    type 2 - no meds    HLD (hyperlipidemia)    diet controlled   Hypertension    no meds   Social History   Socioeconomic History   Marital status: Single    Spouse name: Not on file   Number of children: Not on file   Years of education: Not on file   Highest education level: Not on file  Occupational History   Not on file  Tobacco Use   Smoking status: Former   Smokeless tobacco: Never  Vaping Use   Vaping Use: Never used  Substance and Sexual Activity   Alcohol use: Yes    Comment: occasional   Drug use: Not Currently    Types: Marijuana    Comment: Last use 2020   Sexual activity: Not on file    Comment: Tubal  Other Topics Concern   Not on file  Social History Narrative   Not on file   Social Determinants of Health   Financial Resource Strain: Not on file  Food Insecurity: Not on file  Transportation Needs: Not on file  Physical Activity: Not on file  Stress: Not on file  Social Connections: Not on file   Family History  Problem Relation Age of Onset   Breast cancer Neg Hx    Scheduled Meds:  atorvastatin  40 mg Oral Daily   darbepoetin (ARANESP) injection - DIALYSIS  100 mcg Intravenous Q Wed-HD   FLUoxetine  40 mg  Oral Daily   gabapentin  100 mg Oral QHS   midodrine  10 mg Oral BID WC   mirtazapine  7.5 mg Oral QHS   sodium chloride flush  3 mL Intravenous Q12H   venlafaxine XR  75 mg Oral Q breakfast   Continuous Infusions:  sodium chloride     pantoprazole 8 mg/hr (05/21/21 2231)   PRN Meds:.sodium chloride, sodium chloride flush Medications Prior to Admission:  Prior to Admission medications   Medication Sig Start Date End Date Taking? Authorizing Provider  acetaminophen (TYLENOL) 500 MG tablet Take 1,000 mg by mouth every 6 (six) hours as needed for mild pain.   Yes [provider]  apixaban Arne [place])  5 MG TABS tablet Take 5 mg by mouth 2 (two) times daily.   Yes [provider]  atorvastatin (LIPITOR) 40 MG tablet Take 40 mg by mouth daily.   Yes [provider]  dibucaine (NUPERCAINAL) 1 % OINT Place 1 application rectally every 2 (two) hours as needed (pain).   Yes [provider]  diclofenac Sodium (VOLTAREN) 1 % GEL Apply 2 g topically 4 (four) times daily.   Yes [provider]  FLUoxetine (PROZAC) 40 MG capsule Take 40 mg by mouth daily.   Yes [provider]  gabapentin (NEURONTIN) 100 MG capsule Take 100 mg by mouth at bedtime.   Yes [provider]  hydrocortisone 1 % ointment Apply 1 application topically 2 (two) times daily. Left side of neck   Yes [provider]  lactulose (CHRONULAC) 10 GM/15ML solution Take 60 mLs (40 g total) by mouth daily as needed for mild constipation. Patient taking differently: Take 27 g by mouth daily as needed for mild constipation. 03/11/21  Yes [name], MD  lidocaine (LIDODERM) 5 % Place 1 patch onto the skin See admin instructions. Apply to right neck in the morning and remove at bedtime   Yes [provider]  lidocaine-prilocaine (EMLA) cream Apply 1 application topically every Monday, Wednesday, and Friday. 1-2  Hours prior to dialysis 01/17/21  Yes  [provider]  midodrine (PROAMATINE) 10 MG tablet Take 10 mg by mouth in the morning and at bedtime.   Yes [provider]  mirtazapine (REMERON) 7.5 MG tablet Take 7.5 mg by mouth at bedtime.   Yes [provider]  multivitamin (RENA-VIT) TABS tablet Take 1 tablet by mouth daily.   Yes [provider]  Nutritional Supplements (ENSURE CLEAR PO) Take 237 mLs by mouth in the morning, at noon, and at bedtime.   Yes [provider]  nystatin cream (MYCOSTATIN) Apply 1 application topically See admin instructions. Apply to groin as needed x 14 days for yeast   Yes [provider]  ondansetron (ZOFRAN) 4 MG tablet Take 4 mg by mouth every 6 (six) hours as needed for nausea or vomiting.   Yes [provider]  oxyCODONE (OXY IR/ROXICODONE) 5 MG immediate release tablet Take 5 mg by mouth every 12 (twelve) hours.   Yes [provider]  pantoprazole (PROTONIX) 20 MG tablet Take 1 tablet (20 mg total) by mouth daily. 03/11/21  Yes [name], MD  venlafaxine XR (EFFEXOR-XR) 75 MG 24 hr capsule Take 75 mg by mouth daily. 02/03/21  Yes [provider]   Allergies  Allergen Reactions   Sensipar [Cinacalcet]     Per MAR   Statins     Per MAR   Review of Systems  Unable to perform ROS: Mental status change   Physical Exam Constitutional:      Appearance: She is underweight. She is ill-appearing.  Neurological:     Mental Status: She is lethargic.    Vital Signs: BP (!) 97/50 (BP Location: Left Leg)   Pulse 97   Temp 98.6 F (37 C) (Axillary)   Resp 16   Ht '5\' 7"'$  (1.702 m)   Wt 57 kg   LMP  (LMP Unknown)   SpO2 100%   BMI 19.68 kg/m  Pain Scale: 0-10   Pain Score: 0-No pain   SpO2: SpO2: 100 % O2 Device:SpO2: 100 % O2 Flow Rate: .O2 Flow Rate (L/min): 3 L/min  IO: Intake/output summary:  Intake/Output  Summary (Last 24 hours) at 05/22/2021 1050 Last data filed at 05/22/2021 [id number] Gross per 24  hour  Intake 250 ml  Output 2500 ml  Net -2250 ml    LBM: Last BM Date:  (pt unable to report) Baseline Weight: Weight:  (uta- on ED stretcher) Most recent weight: Weight: 57 kg     Palliative Assessment/Data: 30 % at best    Discussed with Dr Karleen Hampshire   Time In: 1230 Time Out: 1245 Time Total: 75 minutes  Greater than 50%  of this time was spent counseling and coordinating care related to the above assessment and plan.  Signed by: Wadie Lessen, NP   Please contact Palliative Medicine Team phone at [phone] for questions and concerns.  For individual provider: See Shea Evans

## 2021-05-23 DIAGNOSIS — Z515 Encounter for palliative care: Secondary | ICD-10-CM | POA: Diagnosis not present

## 2021-05-23 DIAGNOSIS — Z66 Do not resuscitate: Secondary | ICD-10-CM

## 2021-05-23 DIAGNOSIS — R627 Adult failure to thrive: Secondary | ICD-10-CM

## 2021-05-23 DIAGNOSIS — K922 Gastrointestinal hemorrhage, unspecified: Secondary | ICD-10-CM | POA: Diagnosis not present

## 2021-05-23 DIAGNOSIS — D638 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: Secondary | ICD-10-CM | POA: Diagnosis not present

## 2021-05-23 DIAGNOSIS — E785 Hyperlipidemia, unspecified: Secondary | ICD-10-CM | POA: Diagnosis not present

## 2021-05-23 DIAGNOSIS — N186 End stage renal disease: Secondary | ICD-10-CM | POA: Diagnosis not present

## 2021-05-23 LAB — RENAL FUNCTION PANEL
Albumin: 2.6 g/dL — ABNORMAL LOW (ref 3.5–5.0)
Anion gap: 11 (ref 5–15)
BUN: 8 mg/dL (ref 8–23)
CO2: 26 mmol/L (ref 22–32)
Calcium: 8 mg/dL — ABNORMAL LOW (ref 8.9–10.3)
Chloride: 95 mmol/L — ABNORMAL LOW (ref 98–111)
Creatinine, Ser: 2.49 mg/dL — ABNORMAL HIGH (ref 0.44–1.00)
GFR, Estimated: 20 mL/min — ABNORMAL LOW (ref >60)
Glucose, Bld: 121 mg/dL — ABNORMAL HIGH (ref 70–99)
Phosphorus: 1 mg/dL — CL (ref 2.5–4.6)
Potassium: 2.9 mmol/L — ABNORMAL LOW (ref 3.5–5.1)
Sodium: 132 mmol/L — ABNORMAL LOW (ref 135–145)

## 2021-05-23 LAB — CBC
HCT: 25.8 % — ABNORMAL LOW (ref 36.0–46.0)
Hemoglobin: 8.5 g/dL — ABNORMAL LOW (ref 12.0–15.0)
MCH: 31.4 pg (ref 26.0–34.0)
MCHC: 32.9 g/dL (ref 30.0–36.0)
MCV: 95.2 fL (ref 80.0–100.0)
Platelets: 210 10*3/uL (ref 150–400)
RBC: 2.71 MIL/uL — ABNORMAL LOW (ref 3.87–5.11)
RDW: 19.4 % — ABNORMAL HIGH (ref 11.5–15.5)
WBC: 6.3 10*3/uL (ref 4.0–10.5)
nRBC: 2.2 % — ABNORMAL HIGH (ref 0.0–0.2)

## 2021-05-23 LAB — GLUCOSE, CAPILLARY
Glucose-Capillary: 151 mg/dL — ABNORMAL HIGH (ref 70–99)
Glucose-Capillary: 177 mg/dL — ABNORMAL HIGH (ref 70–99)
Glucose-Capillary: 146 mg/dL — ABNORMAL HIGH (ref 70–99)

## 2021-05-23 MED ORDER — MIDODRINE HCL 5 MG PO TABS
ORAL_TABLET | ORAL | Status: AC
  Administered 2021-05-23: 10 mg via ORAL
  Filled 2021-05-23: qty 2

## 2021-05-23 MED ORDER — K PHOS MONO-SOD PHOS DI & MONO 155-852-130 MG PO TABS
500.0000 mg | ORAL_TABLET | Freq: Once | ORAL | Status: AC
  Administered 2021-05-23: 500 mg via ORAL
  Filled 2021-05-23 (×5): qty 2

## 2021-05-23 MED ORDER — MIDODRINE HCL 5 MG PO TABS
10.0000 mg | ORAL_TABLET | Freq: Three times a day (TID) | ORAL | Status: DC
  Administered 2021-05-24 – 2021-06-06 (×31): 10 mg via ORAL
  Filled 2021-05-23 (×34): qty 2

## 2021-05-23 NOTE — Progress Notes (Addendum)
[facility] Progress Note   Subjective:  Seen at start of HD. HR 115. Looks rough today, not making eye contact and whimpering. Not following commands. Does not appear in distress. Palliative care consult pending.  Objective Vitals:   05/22/21 0938 05/22/21 1838 05/22/21 2051 05/23/21 0508  BP: (!) 97/50 98/79 101/74 (!) 129/97  Pulse: 97 99 82 (!) 101  Resp: '16 16 20 19  '$ Temp: 98.6 F (37 C) 98.3 F (36.8 C) 98 F (36.7 C) 98 F (36.7 C)  TempSrc: Axillary Axillary Oral Oral  SpO2: 100% 100% 100% 100%  Weight:      Height:       Physical Exam General: Chronically ill appearing woman, NAD. R hand tremor, not following commands Heart: Tachycardic, no murmur Lungs: CTA anteriorly Abdomen: soft Extremities: No LE edema, 1-2+ BUE edema (L>R) Dialysis Access: LUE AVG + bruit  Additional Objective Labs: Basic Metabolic Panel: Recent Labs  Lab 05/21/21 1700 05/21/21 2145 05/22/21 0321  NA 130* 133* 131*  K 2.7* 4.3 3.9  CL 94* 97* 96*  CO2 24 21* 21*  GLUCOSE 121* 111* 127*  BUN '21 12 14  '$ CREATININE 4.77* 3.18* 3.51*  CALCIUM 7.6* 7.6* 7.5*  PHOS 2.0* 2.4* 2.1*   Liver Function Tests: Recent Labs  Lab 05/19/21 0920 05/20/21 0448 05/21/21 1700 05/21/21 2145 05/22/21 0321  AST 57* 55*  --   --   --   ALT 20 25  --   --   --   ALKPHOS 76 68  --   --   --   BILITOT 0.9 0.6  --   --   --   PROT 5.9* 5.7*  --   --   --   ALBUMIN 2.4* 2.3* 2.2* 2.2* 2.2*   CBC: Recent Labs  Lab 05/19/21 0920 05/19/21 1835 05/20/21 0315 05/20/21 0448 05/20/21 2340 05/21/21 1700 05/22/21 0321  WBC 10.5   < > 9.1 9.7 10.6* 9.0 9.9  NEUTROABS 7.5  --  6.9  --   --   --   --   HGB 9.6*   < > 8.6* 8.9* 9.2* 8.2* 8.5*  HCT 29.6*   < > 27.3* 26.6* 28.5* 25.0* 26.2*  MCV 97.4   < > 96.8 94.7 94.4 95.8 94.2  PLT 145*   < > 141* 141* 151 173 148*   < > = values in this interval not displayed.   Medications:  sodium chloride      atorvastatin  40 mg Oral Daily    Chlorhexidine Gluconate Cloth  6 each Topical Q0600   darbepoetin (ARANESP) injection - DIALYSIS  100 mcg Intravenous Q Wed-HD   FLUoxetine  40 mg Oral Daily   gabapentin  100 mg Oral QHS   midodrine  10 mg Oral BID WC   mirtazapine  7.5 mg Oral QHS   pantoprazole  40 mg Oral Q0600   sodium chloride flush  3 mL Intravenous Q12H   venlafaxine XR  75 mg Oral Q breakfast    Dialysis Orders: AF MWF 3h 13mn 400/500 EDW 62.5kg (although getting under ~60) 3K/2.5 Ca UFP 2  AVG HeRO No heparin - Mircera 50 q 2 wks  (7/13)  Venofer 50 q wk - Parsabiv '5mg'$  IV q HD  Assessment/Plan: Melena/Rectal bleeding - GI consulted. Hx rectal ulcers.  EGD 7/27 normal and flex sig with mucosal ulceration c/w sterocoral ulcers, non bleeding.  Will need colonoscopy if plan to continue full scope of management.  Palliative care consulted to establish goals of care. Hypokalemia: K 3.9, using 3K bath with HD. ESRD: Continue HD per MWF schedule for now - HD now in recliner, but looks a little unsafe - keeps hanging leg off side of chair and unable to follow commands.  Question about patient appropriateness for OP dialysis at this time - she is not responsive to questioning. Mild tachycardia - follow closely, today's labs pending. 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 Hypertension/volume  - BP soft. On midodrine for BP support.  Edema improving, UF as tolerated.  Under edw if weights correct. Anemia  - Hgb trending down, last 8.5. on ESA, weekly Fe. Continue Aranesp 142mg q Wed. Metabolic bone disease -  Corr Ca and phos ok.  Continue to monitor. No binders currently  Nutrition: Poorly eating per notes. Consider liberalized diet when tolerating PO.  Recent CVA   ADDENDUM 8:44am: Hypotensive, UF off. ^ midodrine to '10mg'$  TID and giving now. ADDENDUM #2 - Labs resulted for today, Phos 1. No binders, add supplement. K 2.9 - change to  4K.  KVeneta Penton PA-C 05/23/2021, 8:15 AM  [employer]

## 2021-05-23 NOTE — Progress Notes (Signed)
Patient ID: HAILEI RASMUS, female   DOB: 1946/01/03, 75 y.o.   MRN: [id number]    Progress Note from the Palliative Medicine Team at [facility]   Patient Name: Amy [name]        Date: 05/23/2021 DOB: 1946/06/29  Age: 75 y.o. MRN#: [id number] Attending Physician: Hosie Poisson, MD Primary Care Physician: [name], MD Admit Date: 05/19/2021   Medical records reviewed, spoke to attending.  This NP spoke to son/Amy [name] by phone as a follow up to  yesterday's [place].  Discussed with son that treatment team is concerned with patient's continued decline today; she is weaker, non communicative and hypotensive.  Amy [name] understands the seriousness of his mother's situation, and that anything can happen at anytime.  Education offered again today regarding the difference between an aggressive medical intervention path and a palliative comfort path   Plan of care: -DNR/DNI-documented today -no artificial feeding for now -no colonoscopy for now -continue to treat the treatable thru the week-end and then re-meet with this NP on Monday to further clarify GOCs depending on outcomes. -family continues to hope for improvement  Again stressed the importance of continued conversation with family Amy [name] plans to talk with the patient's brothers to keep them in the loop)and the medical providers regarding overall plan of care and treatment options,  ensuring decisions are within the context of the patients values and GOCs.  Questions and concerns addressed   Discussed with Dr  Karleen Hampshire  Total time spent was 35 minutes  Greater than 50% of the time was spent in counseling and coordination of care  Wadie Lessen NP  Palliative Medicine Team Team Phone # [phone] Pager [phone]

## 2021-05-23 NOTE — Plan of Care (Signed)
  Problem: Nutrition: Goal: Adequate nutrition will be maintained Outcome: Not Progressing   

## 2021-05-23 NOTE — Progress Notes (Signed)
PROGRESS NOTE    Amy [name]  [id number] DOB: May 26, 1946 DOA: 05/19/2021 PCP: [name], MD    Chief Complaint  Patient presents with   Rectal Bleeding    Brief Narrative:    Amy [name] is a 75 y.o. 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  Hemoccult positive GI consulted and  she underwent , EGD wnl, and flex sigmoidoscopy, showing Mucosal ulceration c/w Stercoral ulcers. Non bleeding.. she will need colonoscopy.  In view of her advanced age, multiple admissions for the rectal bleeding , palliative care consulted and family has requested no artificial feeding, no colonoscopy for now. Pt continued to decline, with minimal oral intake.  Transfuse to keep hemoglobin greater than 7. Hemoglobin around 8.5.   End-stage renal disease on dialysis Nephrology consulted and HD as per nehrology.    Hypomagnesemia and hypokalemia  Replaced.    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    Hyperlipidemia Continue with  statin    Hypotension on midodrine  Stage 2 Pressure injury Pressure Injury 05/20/21 Coccyx Mid Stage 2 -  Partial thickness loss of dermis presenting as a shallow open injury with a red, pink wound bed without slough. 1X0.5X0 (Active)  05/20/21 1315  Location: Coccyx  Location Orientation: Mid  Staging: Stage 2 -  Partial thickness loss of dermis presenting as a shallow open injury with a red, pink wound bed without slough.  Wound Description (Comments): 1X0.5X0  Present on Admission: Yes  Wound care .        DVT prophylaxis: (scd's) Code Status: (full code.  Family Communication: (none at bedside. )  Disposition:   Status is: Inpatient.   The patient will require care spanning > 2 midnights and should be moved to inpatient because: Ongoing diagnostic testing needed not appropriate for outpatient work up, Unsafe d/c plan, and IV treatments appropriate due to intensity of illness or inability to take PO  Dispo: The patient is from: Home              Anticipated d/c is to: Home              Patient currently is not medically stable to d/c.   Difficult to place patient No       Consultants:  Gastroenterology Nephrology.   Procedures: EGD,  Antimicrobials: none.    Subjective: Lethargic,not following commands.  Objective: Vitals:   05/23/21 0900 05/23/21 0930 05/23/21 1000 05/23/21 1030  BP: (!) 70/24 (!) 79/54 (!) 134/92 [name])  96/48  Pulse: (!) 121 (!) 115 (!) 106 (!) 103  Resp: 20 (!) 26 (!) 21 15  Temp:      TempSrc:      SpO2:      Weight:      Height:        Intake/Output Summary (Last 24 hours) at 05/23/2021 1034 Last data filed at 05/23/2021 0514 Gross per 24 hour  Intake 60 ml  Output 0 ml  Net 60 ml    Filed Weights   05/21/21 1600 05/21/21 1928 05/23/21 0745  Weight: 59.5 kg 57 kg 57 kg    Examination:  General exam: Elderly lady, ill appearing, no distress.  Respiratory system: Air entry fair bilateral, no wheezing or  rhonchi Cardiovascular system: Diminished air entry at bases, no JVD Gastrointestinal system: Abdomen is soft, nondistended, bowel sounds heard Central nervous system: Lethargic, not following commands Extremities: No cyanosis.  Skin: Stage II sacral pressure ulcer Psychiatry: cannot be assessed.     Data Reviewed: I have personally reviewed following labs and imaging studies  CBC: Recent Labs  Lab 05/19/21 0920 05/19/21 1835 05/20/21 0315 05/20/21 0448 05/20/21 2340 05/21/21 1700 05/22/21 0321 05/23/21 0733  WBC 10.5   < > 9.1 9.7 10.6* 9.0 9.9 6.3  NEUTROABS 7.5  --  6.9  --   --   --   --   --   HGB 9.6*   < > 8.6* 8.9* 9.2* 8.2* 8.5* 8.5*  HCT 29.6*   < > 27.3* 26.6* 28.5* 25.0* 26.2* 25.8*  MCV 97.4   < > 96.8 94.7 94.4 95.8 94.2 95.2  PLT 145*   < > 141* 141* 151 173 148* 210   < > = values in this interval not displayed.     Basic Metabolic Panel: Recent Labs  Lab 05/19/21 0920 05/20/21 0448 05/20/21 2340 05/21/21 1700 05/21/21 2145 05/22/21 0321 05/23/21 0733  NA 132*   < > 135 130* 133* 131* 132*  K 2.8*   < > 4.0 2.7* 4.3 3.9 2.9*  CL 90*   < > 97* 94* 97* 96* 95*  CO2 31   < > 23 24 21* 21* 26  GLUCOSE 211*   < > 160* 121* 111* 127* 121*  BUN 18   < > '18 21 12 14 8  '$ CREATININE 5.30*   < > 4.32* 4.77* 3.18* 3.51* 2.49*  CALCIUM 7.6*   < > 7.9* 7.6* 7.6* 7.5* 8.0*  MG 1.9  --   --   --   --  1.9  --   PHOS  --   --   --  2.0* 2.4* 2.1* 1.0*   < > = values in this interval not displayed.     GFR: Estimated Creatinine Clearance: 18.1 mL/min (A) (by C-G formula based on SCr of 2.49 mg/dL (H)).  Liver Function Tests: Recent Labs  Lab 05/19/21 0920 05/20/21 0448 05/21/21 1700 05/21/21 2145 05/22/21 0321 05/23/21 0733  AST 57* 55*  --   --   --   --   ALT 20 25  --   --   --   --   ALKPHOS 76 68  --   --   --   --   BILITOT 0.9 0.6  --   --   --   --   PROT 5.9* 5.7*  --   --   --   --   ALBUMIN 2.4* 2.3* 2.2* 2.2* 2.2* 2.6*  CBG: Recent Labs  Lab 05/22/21 0636 05/22/21 1204 05/22/21 1707 05/22/21 2101 05/23/21 0646  GLUCAP 126* 110* 130* 156* 151*      Recent Results (from the past 240 hour(s))  Resp Panel by RT-PCR (Flu A&B, Covid) Nasopharyngeal Swab     Status: None   Collection Time: 05/19/21 12:15 PM   Specimen: Nasopharyngeal Swab; Nasopharyngeal(NP) swabs in vial transport medium  Result Value Ref Range Status   SARS Coronavirus 2 by RT PCR NEGATIVE NEGATIVE Final    Comment: (NOTE) SARS-CoV-2 target nucleic acids are NOT DETECTED.  The SARS-CoV-2 RNA is generally detectable in upper respiratory specimens during the acute phase of infection. The lowest concentration of SARS-CoV-2 viral copies this assay can detect is 138 copies/mL. A negative result does not preclude SARS-Cov-2 infection and should not be used as the sole basis for treatment or other patient management decisions. A negative result may occur with  improper specimen collection/handling, submission of specimen other than nasopharyngeal swab, presence of viral mutation(s) within the areas targeted by this assay, and inadequate number of viral copies(<138 copies/mL). A negative result must be combined with clinical observations, patient history, and epidemiological information. The expected result is Negative.  Fact Sheet for Patients:  EntrepreneurPulse.com.au  Fact Sheet for Healthcare Providers:  IncredibleEmployment.be  This test is no t yet approved or cleared by the [place] FDA and  has been authorized for detection and/or diagnosis of SARS-CoV-2 by FDA under an Emergency Use Authorization (EUA). This EUA will remain  in effect (meaning this test can be used) for the duration of the COVID-19 declaration under Section 564(b)(1) of the Act, 21 U.S.C.section 360bbb-3(b)(1), unless the authorization is terminated  or revoked sooner.       Influenza A by PCR NEGATIVE  NEGATIVE Final   Influenza B by PCR NEGATIVE NEGATIVE Final    Comment: (NOTE) The Xpert Xpress SARS-CoV-2/FLU/RSV plus assay is intended as an aid in the diagnosis of influenza from Nasopharyngeal swab specimens and should not be used as a sole basis for treatment. Nasal washings and aspirates are unacceptable for Xpert Xpress SARS-CoV-2/FLU/RSV testing.  Fact Sheet for Patients: EntrepreneurPulse.com.au  Fact Sheet for Healthcare Providers: IncredibleEmployment.be  This test is not yet approved or cleared by the [place] FDA and has been authorized for detection and/or diagnosis of SARS-CoV-2 by FDA under an Emergency Use Authorization (EUA). This EUA will remain in effect (meaning this test can be used) for the duration of the COVID-19 declaration under Section 564(b)(1) of the Act, 21 U.S.C. section 360bbb-3(b)(1), unless the authorization is terminated or revoked.  Performed at [facility], [place] [address] [place], [place] [address]           Radiology Studies: No results found.      Scheduled Meds:  atorvastatin  40 mg Oral Daily   Chlorhexidine Gluconate Cloth  6 each Topical Q0600   darbepoetin (ARANESP) injection - DIALYSIS  100 mcg Intravenous Q Wed-HD   gabapentin  100 mg Oral QHS   midodrine  10 mg Oral TID   mirtazapine  7.5 mg Oral QHS   pantoprazole  40 mg Oral Q0600   phosphorus  500 mg Oral Once   sodium chloride flush  3 mL Intravenous Q12H   venlafaxine XR  75 mg Oral Q breakfast   Continuous Infusions:  sodium chloride       LOS: 3 days        Hosie Poisson, MD [facility]  To contact the attending provider between 7A-7P or the covering provider during after hours 7P-7A, please log into the web site [email or web address] and access using universal [place] password for that web site. If you do not have the password, please call the hospital operator.  05/23/2021, 10:34 AM

## 2021-05-24 DIAGNOSIS — N186 End stage renal disease: Secondary | ICD-10-CM | POA: Diagnosis not present

## 2021-05-24 DIAGNOSIS — K922 Gastrointestinal hemorrhage, unspecified: Secondary | ICD-10-CM | POA: Diagnosis not present

## 2021-05-24 DIAGNOSIS — E785 Hyperlipidemia, unspecified: Secondary | ICD-10-CM | POA: Diagnosis not present

## 2021-05-24 DIAGNOSIS — D638 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: Secondary | ICD-10-CM | POA: Diagnosis not present

## 2021-05-24 LAB — BASIC METABOLIC PANEL
Anion gap: 16 — ABNORMAL HIGH (ref 5–15)
BUN: 10 mg/dL (ref 8–23)
CO2: 21 mmol/L — ABNORMAL LOW (ref 22–32)
Calcium: 8.1 mg/dL — ABNORMAL LOW (ref 8.9–10.3)
Chloride: 97 mmol/L — ABNORMAL LOW (ref 98–111)
Creatinine, Ser: 3.97 mg/dL — ABNORMAL HIGH (ref 0.44–1.00)
GFR, Estimated: 11 mL/min — ABNORMAL LOW (ref >60)
Glucose, Bld: 96 mg/dL (ref 70–99)
Potassium: 4.1 mmol/L (ref 3.5–5.1)
Sodium: 134 mmol/L — ABNORMAL LOW (ref 135–145)

## 2021-05-24 LAB — CBC
HCT: 29 % — ABNORMAL LOW (ref 36.0–46.0)
Hemoglobin: 8.8 g/dL — ABNORMAL LOW (ref 12.0–15.0)
MCH: 30.1 pg (ref 26.0–34.0)
MCHC: 30.3 g/dL (ref 30.0–36.0)
MCV: 99.3 fL (ref 80.0–100.0)
Platelets: 209 10*3/uL (ref 150–400)
RBC: 2.92 MIL/uL — ABNORMAL LOW (ref 3.87–5.11)
RDW: 19.6 % — ABNORMAL HIGH (ref 11.5–15.5)
WBC: 8.9 10*3/uL (ref 4.0–10.5)
nRBC: 1.1 % — ABNORMAL HIGH (ref 0.0–0.2)

## 2021-05-24 LAB — GLUCOSE, CAPILLARY
Glucose-Capillary: 105 mg/dL — ABNORMAL HIGH (ref 70–99)
Glucose-Capillary: 95 mg/dL (ref 70–99)
Glucose-Capillary: 80 mg/dL (ref 70–99)
Glucose-Capillary: 157 mg/dL — ABNORMAL HIGH (ref 70–99)
Glucose-Capillary: 156 mg/dL — ABNORMAL HIGH (ref 70–99)

## 2021-05-24 NOTE — Progress Notes (Signed)
[facility] Progress Note   Subjective:  Seen in room, asking me to "take that cat off of me", "can't you see it, it's in my face?".  No SOB or CP.   Objective Vitals:   05/23/21 1700 05/23/21 2210 05/24/21 0452 05/24/21 0950  BP: (!) 125/56 122/70 125/61 (!) 124/58  Pulse: (!) 102 (!) 110 (!) 101 94  Resp: '16 18 18 15  '$ Temp: 97.8 F (36.6 C) 97.8 F (36.6 C) 98.2 F (36.8 C) 98.4 F (36.9 C)  TempSrc:   Oral Axillary  SpO2: 95% 100% 100% 100%  Weight:      Height:       Physical Exam General: Chronically ill appearing woman, NAD. R hand tremor, not following commands Heart: Tachycardic, no murmur Lungs: CTA anteriorly Abdomen: soft Extremities: No LE edema, 1-2+ BUE edema (L>R) Dialysis Access: LUE AVG + bruit  Additional Objective Labs: Basic Metabolic Panel: Recent Labs  Lab 05/21/21 2145 05/22/21 0321 05/23/21 0733 05/24/21 1256  NA 133* 131* 132* 134*  K 4.3 3.9 2.9* 4.1  CL 97* 96* 95* 97*  CO2 21* 21* 26 21*  GLUCOSE 111* 127* 121* 96  BUN '12 14 8 10  '$ CREATININE 3.18* 3.51* 2.49* 3.97*  CALCIUM 7.6* 7.5* 8.0* 8.1*  PHOS 2.4* 2.1* 1.0*  --     Liver Function Tests: Recent Labs  Lab 05/19/21 0920 05/20/21 0448 05/21/21 1700 05/21/21 2145 05/22/21 0321 05/23/21 0733  AST 57* 55*  --   --   --   --   ALT 20 25  --   --   --   --   ALKPHOS 76 68  --   --   --   --   BILITOT 0.9 0.6  --   --   --   --   PROT 5.9* 5.7*  --   --   --   --   ALBUMIN 2.4* 2.3*   < > 2.2* 2.2* 2.6*   < > = values in this interval not displayed.    CBC: Recent Labs  Lab 05/19/21 0920 05/19/21 1835 05/20/21 0315 05/20/21 0448 05/20/21 2340 05/21/21 1700 05/22/21 0321 05/23/21 0733 05/24/21 1256  WBC 10.5   < > 9.1   < > 10.6* 9.0 9.9 6.3 8.9  NEUTROABS 7.5  --  6.9  --   --   --   --   --   --   HGB 9.6*   < > 8.6*   < > 9.2* 8.2* 8.5* 8.5* 8.8*  HCT 29.6*   < > 27.3*   < > 28.5* 25.0* 26.2* 25.8* 29.0*  MCV 97.4   < > 96.8   < > 94.4 95.8  94.2 95.2 99.3  PLT 145*   < > 141*   < > 151 173 148* 210 209   < > = values in this interval not displayed.    Medications:  sodium chloride      atorvastatin  40 mg Oral Daily   Chlorhexidine Gluconate Cloth  6 each Topical Q0600   darbepoetin (ARANESP) injection - DIALYSIS  100 mcg Intravenous Q Wed-HD   gabapentin  100 mg Oral QHS   midodrine  10 mg Oral TID   mirtazapine  7.5 mg Oral QHS   pantoprazole  40 mg Oral Q0600   sodium chloride flush  3 mL Intravenous Q12H   venlafaxine XR  75 mg Oral Q breakfast    Dialysis Orders: AF MWF 3h  54mn 400/500 EDW 62.5kg (although getting under ~60) 3K/2.5 Ca UFP 2  AVG HeRO No heparin - Mircera 50 q 2 wks  (7/13)  Venofer 50 q wk - Parsabiv '5mg'$  IV q HD  Assessment/Plan: Melena/Rectal bleeding - GI consulted. Hx rectal ulcers.  EGD 7/27 normal and flex sig with mucosal ulceration c/w sterocoral ulcers, non bleeding.  Will need colonoscopy if plan to continue full scope of management.   AMS: Palliative care consulted to establish goals of care. Pt is now DNR, no artificial feeds, no colonscopy for now. PCT will reassess and meet w/ family again on Monday. Patient's mental status is very poor, she is hallucinating regularly.   Hypokalemia: K 3.9, using 3K bath with HD. ESRD: Continue HD per MWF schedule for now. Try to dialyze her in the chair. Discussions about benefits vs con's of aggressive Rx (e.g., HD) for this patient are in progress w/ PCT. In her current condition she doesn't appear to have any idea what is happening.  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 Hypertension/volume  - BP soft. On midodrine for BP support.  Edema improving, UF as tolerated.  Under edw if weights correct. Anemia  - Hgb trending down, last 8.5. on ESA, weekly Fe. Continue Aranesp 107m q Wed. Metabolic bone disease -  Corr Ca and phos ok.  Continue to monitor. No binders currently   Nutrition: Poorly eating per notes. Consider liberalized diet when tolerating PO.  Recent CVA    RoKelly SplinterMD 05/24/2021, 3:00 PM

## 2021-05-24 NOTE — Progress Notes (Signed)
PROGRESS NOTE    Amy [name]  [id number] DOB: [birth year] DOA: 05/19/2021 PCP: [name], MD    Chief Complaint  Patient presents with   Rectal Bleeding    Brief Narrative:    Amy [name] is a 75 y.o. 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 Pt underwent EGD which was wnl and sigmoidoscopy showing stercoral ulcers and not a good prep. GI recommended colonoscopy for further evaluation but she would need NG TUBE for the prep. In view of her multiple co morbidities, recurrent admissions, palliative care consulted for goals of care discussions.  Pt seen and examined at bedside. Pt is alert, oriented to person only, confused. Thinks she is at home.   Assessment & Plan:   Principal Problem:   GI bleed Active Problems:   Essential hypertension   Type 2 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, with long-term current use of insulin (HCC)   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   ESRD on dialysis ([place])   Dyslipidemia   Hypokalemia   Stage 2 skin ulcer of sacral region ([place])   GI bleed/anemia of acute blood loss superimposed on anemia of chronic disease from ESRD.  Hemoccult positive GI consulted and  she underwent , EGD wnl, and flex sigmoidoscopy, showing Mucosal ulceration c/w Stercoral ulcers. Non bleeding. she will need colonoscopy.  In view of her advanced age, multiple co morbidities, multiple admissions for the rectal bleeding , palliative care consulted and family has requested no artificial feeding, no colonoscopy for now. Pt continued to decline, with minimal oral intake.  Transfuse to keep hemoglobin greater than 7. Hemoglobin around 8.5. recheck labs today.    End-stage renal disease on dialysis Nephrology consulted and HD as per nehrology.     Hypomagnesemia and hypokalemia  Replaced.    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   Type 2 diabetes mellitus with end-stage renal disease on chronic dialysis, insulin-dependent Last A1c 6.9 Diet controlled Continue with sliding scale insulin for now CBG (last 3)  Recent Labs    05/23/21 2213 05/24/21 0640 05/24/21 0704  GLUCAP 146* 95 105*   Minimal oral intake. No change in meds at this time.    Hyperlipidemia Continue with statin    Hypotension on midodrine  Stage 2 Pressure injury Pressure Injury 05/20/21 Coccyx Mid Stage 2 -  Partial thickness loss of dermis presenting as a shallow open injury with a red, pink wound bed without slough. 1X0.5X0 (Active)  05/20/21 1315  Location: Coccyx  Location Orientation: Mid  Staging: Stage 2 -  Partial thickness loss of dermis presenting as a shallow open injury with a red, pink wound bed without slough.  Wound Description (Comments): 1X0.5X0  Present on Admission: Yes  Wound care .    Acute metabolic encephalopathy/ deconditioning, failure to thrive:  Pt confused, minimal oral intake.    Hypokalemia and hypophosphatemia:  Recheck labs this afternoon.     DVT prophylaxis: (scd's) Code Status: (full code.  Family Communication: (none at bedside. )  Disposition:   Status is: Inpatient.   The patient will require care spanning > 2 midnights and should be moved to inpatient because: Ongoing diagnostic testing needed not appropriate for outpatient work up, Unsafe d/c plan, and IV treatments appropriate due  to intensity of illness or inability to take PO  Dispo: The patient is from: Home              Anticipated d/c is to: Home              Patient currently is not medically stable to d/c.   Difficult to place patient No       Consultants:  Gastroenterology Nephrology.   Procedures: EGD,  Antimicrobials: none.    Subjective: Opened eyes on verbal cues. Confused  . Not in distress.  No nausea, vomiting.  Oriented to person only. She thinks she is at home.  No agitation.   Objective: Vitals:   05/23/21 1700 05/23/21 2210 05/24/21 0452 05/24/21 0950  BP: (!) 125/56 122/70 125/61 (!) 124/58  Pulse: (!) 102 (!) 110 (!) 101 94  Resp: '16 18 18 15  '$ Temp: 97.8 F (36.6 C) 97.8 F (36.6 C) 98.2 F (36.8 C) 98.4 F (36.9 C)  TempSrc:   Oral Axillary  SpO2: 95% 100% 100% 100%  Weight:      Height:        Intake/Output Summary (Last 24 hours) at 05/24/2021 1019 Last data filed at 05/24/2021 0853 Gross per 24 hour  Intake 30 ml  Output 70 ml  Net -40 ml    Filed Weights   05/21/21 1600 05/21/21 1928 05/23/21 0745  Weight: 59.5 kg 57 kg 57 kg    Examination:  General exam: Elderly woman, chronically ill-appearing blind, not in any kind of distress Respiratory system: Air entry fair bilateral no wheezing or rhonchi Cardiovascular system: S1-S2 heard, regular rate rhythm, no JVD no pedal edema Gastrointestinal system: Abdomen is soft nontender bowel sounds normal Central nervous system: Alert, confused Extremities: No pedal edema Skin: Stage II sacral pressure ulcer Psychiatry: Cannot be assessed    Data Reviewed: I have personally reviewed following labs and imaging studies  CBC: Recent Labs  Lab 05/19/21 0920 05/19/21 1835 05/20/21 0315 05/20/21 0448 05/20/21 2340 05/21/21 1700 05/22/21 0321 05/23/21 0733  WBC 10.5   < > 9.1 9.7 10.6* 9.0 9.9 6.3  NEUTROABS 7.5  --  6.9  --   --   --   --   --   HGB 9.6*   < > 8.6* 8.9* 9.2* 8.2* 8.5* 8.5*  HCT 29.6*   < > 27.3* 26.6* 28.5* 25.0* 26.2* 25.8*  MCV 97.4   < > 96.8 94.7 94.4 95.8 94.2 95.2  PLT 145*   < > 141* 141* 151 173 148* 210   < > = values in this interval not displayed.     Basic Metabolic Panel: Recent Labs  Lab 05/19/21 0920 05/20/21 0448 05/20/21 2340 05/21/21 1700 05/21/21 2145 05/22/21 0321 05/23/21 0733  NA 132*   < > 135 130* 133* 131* 132*  K 2.8*    < > 4.0 2.7* 4.3 3.9 2.9*  CL 90*   < > 97* 94* 97* 96* 95*  CO2 31   < > 23 24 21* 21* 26  GLUCOSE 211*   < > 160* 121* 111* 127* 121*  BUN 18   < > '18 21 12 14 8  '$ CREATININE 5.30*   < > 4.32* 4.77* 3.18* 3.51* 2.49*  CALCIUM 7.6*   < > 7.9* 7.6* 7.6* 7.5* 8.0*  MG 1.9  --   --   --   --  1.9  --   PHOS  --   --   --  2.0*  2.4* 2.1* 1.0*   < > = values in this interval not displayed.     GFR: Estimated Creatinine Clearance: 18.1 mL/min (A) (by C-G formula based on SCr of 2.49 mg/dL (H)).  Liver Function Tests: Recent Labs  Lab 05/19/21 0920 05/20/21 0448 05/21/21 1700 05/21/21 2145 05/22/21 0321 05/23/21 0733  AST 57* 55*  --   --   --   --   ALT 20 25  --   --   --   --   ALKPHOS 76 68  --   --   --   --   BILITOT 0.9 0.6  --   --   --   --   PROT 5.9* 5.7*  --   --   --   --   ALBUMIN 2.4* 2.3* 2.2* 2.2* 2.2* 2.6*     CBG: Recent Labs  Lab 05/23/21 0646 05/23/21 1701 05/23/21 2213 05/24/21 0640 05/24/21 0704  GLUCAP 151* 177* 146* 95 105*      Recent Results (from the past 240 hour(s))  Resp Panel by RT-PCR (Flu A&B, Covid) Nasopharyngeal Swab     Status: None   Collection Time: 05/19/21 12:15 PM   Specimen: Nasopharyngeal Swab; Nasopharyngeal(NP) swabs in vial transport medium  Result Value Ref Range Status   SARS Coronavirus 2 by RT PCR NEGATIVE NEGATIVE Final    Comment: (NOTE) SARS-CoV-2 target nucleic acids are NOT DETECTED.  The SARS-CoV-2 RNA is generally detectable in upper respiratory specimens during the acute phase of infection. The lowest concentration of SARS-CoV-2 viral copies this assay can detect is 138 copies/mL. A negative result does not preclude SARS-Cov-2 infection and should not be used as the sole basis for treatment or other patient management decisions. A negative result may occur with  improper specimen collection/handling, submission of specimen other than nasopharyngeal swab, presence of viral mutation(s) within  the areas targeted by this assay, and inadequate number of viral copies(<138 copies/mL). A negative result must be combined with clinical observations, patient history, and epidemiological information. The expected result is Negative.  Fact Sheet for Patients:  EntrepreneurPulse.com.au  Fact Sheet for Healthcare Providers:  IncredibleEmployment.be  This test is no t yet approved or cleared by the [place] FDA and  has been authorized for detection and/or diagnosis of SARS-CoV-2 by FDA under an Emergency Use Authorization (EUA). This EUA will remain  in effect (meaning this test can be used) for the duration of the COVID-19 declaration under Section 564(b)(1) of the Act, 21 U.S.C.section 360bbb-3(b)(1), unless the authorization is terminated  or revoked sooner.       Influenza A by PCR NEGATIVE NEGATIVE Final   Influenza B by PCR NEGATIVE NEGATIVE Final    Comment: (NOTE) The Xpert Xpress SARS-CoV-2/FLU/RSV plus assay is intended as an aid in the diagnosis of influenza from Nasopharyngeal swab specimens and should not be used as a sole basis for treatment. Nasal washings and aspirates are unacceptable for Xpert Xpress SARS-CoV-2/FLU/RSV testing.  Fact Sheet for Patients: EntrepreneurPulse.com.au  Fact Sheet for Healthcare Providers: IncredibleEmployment.be  This test is not yet approved or cleared by the [place] FDA and has been authorized for detection and/or diagnosis of SARS-CoV-2 by FDA under an Emergency Use Authorization (EUA). This EUA will remain in effect (meaning this test can be used) for the duration of the COVID-19 declaration under Section 564(b)(1) of the Act, 21 U.S.C. section 360bbb-3(b)(1), unless the authorization is terminated or revoked.  Performed at [facility], [place]  [address], [place], [place] [address]           Radiology Studies: No results  found.      Scheduled Meds:  atorvastatin  40 mg Oral Daily   Chlorhexidine Gluconate Cloth  6 each Topical Q0600   darbepoetin (ARANESP) injection - DIALYSIS  100 mcg Intravenous Q Wed-HD   gabapentin  100 mg Oral QHS   midodrine  10 mg Oral TID   mirtazapine  7.5 mg Oral QHS   pantoprazole  40 mg Oral Q0600   sodium chloride flush  3 mL Intravenous Q12H   venlafaxine XR  75 mg Oral Q breakfast   Continuous Infusions:  sodium chloride       LOS: 4 days        Hosie Poisson, MD [facility]   To contact the attending provider between 7A-7P or the covering provider during after hours 7P-7A, please log into the web site [email or web address] and access using universal [place] password for that web site. If you do not have the password, please call the hospital operator.  05/24/2021, 10:19 AM

## 2021-05-24 NOTE — Plan of Care (Signed)
  Problem: Nutrition: Goal: Adequate nutrition will be maintained Outcome: Progressing   Problem: Skin Integrity: Goal: Risk for impaired skin integrity will decrease Outcome: Progressing   

## 2021-05-25 ENCOUNTER — Inpatient Hospital Stay (HOSPITAL_COMMUNITY): Payer: Medicare Other

## 2021-05-25 DIAGNOSIS — E785 Hyperlipidemia, unspecified: Secondary | ICD-10-CM | POA: Diagnosis not present

## 2021-05-25 DIAGNOSIS — K922 Gastrointestinal hemorrhage, unspecified: Secondary | ICD-10-CM | POA: Diagnosis not present

## 2021-05-25 DIAGNOSIS — N186 End stage renal disease: Secondary | ICD-10-CM | POA: Diagnosis not present

## 2021-05-25 DIAGNOSIS — D638 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: Secondary | ICD-10-CM | POA: Diagnosis not present

## 2021-05-25 LAB — GLUCOSE, CAPILLARY
Glucose-Capillary: 160 mg/dL — ABNORMAL HIGH (ref 70–99)
Glucose-Capillary: 173 mg/dL — ABNORMAL HIGH (ref 70–99)
Glucose-Capillary: 143 mg/dL — ABNORMAL HIGH (ref 70–99)
Glucose-Capillary: 106 mg/dL — ABNORMAL HIGH (ref 70–99)
Glucose-Capillary: 103 mg/dL — ABNORMAL HIGH (ref 70–99)

## 2021-05-25 LAB — PHOSPHORUS: Phosphorus: 1.7 mg/dL — ABNORMAL LOW (ref 2.5–4.6)

## 2021-05-25 LAB — AMMONIA: Ammonia: 61 umol/L — ABNORMAL HIGH (ref 9–35)

## 2021-05-25 NOTE — Plan of Care (Signed)
?  Problem: Education: ?Goal: Knowledge of General Education information will improve ?Description: Including pain rating scale, medication(s)/side effects and non-pharmacologic comfort measures ?Outcome: Progressing ?  ?Problem: Clinical Measurements: ?Goal: Ability to maintain clinical measurements within normal limits will improve ?Outcome: Progressing ?  ?Problem: Nutrition: ?Goal: Adequate nutrition will be maintained ?Outcome: Progressing ?  ?Problem: Coping: ?Goal: Level of anxiety will decrease ?Outcome: Progressing ?  ?Problem: Elimination: ?Goal: Will not experience complications related to bowel motility ?Outcome: Progressing ?  ?

## 2021-05-25 NOTE — Progress Notes (Signed)
   05/23/21 1233  Assess: MEWS Score  Temp 98.5 F (36.9 C)  BP (!) 110/51  Pulse Rate (!) 107  Resp (!) 22  SpO2 100 %  O2 Device Nasal Cannula  O2 Flow Rate (L/min) 2 L/min  Assess: MEWS Score  MEWS Temp 0  MEWS Systolic 0  MEWS Pulse 1  MEWS RR 1  MEWS LOC 0  MEWS Score 2  MEWS Score Color Yellow  Assess: if the MEWS score is Yellow or Red  Were vital signs taken at a resting state? Yes  Focused Assessment No change from prior assessment  Early Detection of Sepsis Score *See Row Information* Low  MEWS guidelines implemented *See Row Information* No, previously yellow, continue vital signs every 4 hours  Treat  Pain Scale 0-10  Pain Score 0  Notify: Charge Nurse/RN  Name of Charge Nurse/RN Notified Marlou Starks  Date Charge Nurse/RN Notified 05/23/21  Time Charge Nurse/RN Notified 1233  Notify: Provider  Provider Name/Title Dr Karleen Hampshire  Date Provider Notified 05/23/21  Time Provider Notified [id number]  Notification Type Page  Notification Reason Other (Comment) (Patient's changed to DNR.)  Provider response No new orders  Date of Provider Response 05/23/21  Time of Provider Response 1300

## 2021-05-25 NOTE — Progress Notes (Signed)
[facility] Progress Note   Subjective:  Seen in room, not interacting today, lethargic  Objective Vitals:   05/24/21 1742 05/24/21 2052 05/25/21 0454 05/25/21 1001  BP: 119/60 (!) 123/59 112/64 (!) 114/58  Pulse: 85 95 96 84  Resp: '18 16 16 16  '$ Temp: 97.9 F (36.6 C) 98.5 F (36.9 C) 98 F (36.7 C) 98 F (36.7 C)  TempSrc: Axillary Oral Oral Oral  SpO2: 97% 100% 100% 100%  Weight:      Height:       Physical Exam General: Chronically ill appearing woman, NAD. R hand tremor, not following commands Heart: Tachycardic, no murmur Lungs: CTA anteriorly Abdomen: soft Extremities: No LE edema, 1-2+ BUE edema (L>R) Dialysis Access: LUE AVG + bruit  Additional Objective Labs: Basic Metabolic Panel: Recent Labs  Lab 05/21/21 2145 05/22/21 0321 05/23/21 0733 05/24/21 1256  NA 133* 131* 132* 134*  K 4.3 3.9 2.9* 4.1  CL 97* 96* 95* 97*  CO2 21* 21* 26 21*  GLUCOSE 111* 127* 121* 96  BUN '12 14 8 10  '$ CREATININE 3.18* 3.51* 2.49* 3.97*  CALCIUM 7.6* 7.5* 8.0* 8.1*  PHOS 2.4* 2.1* 1.0*  --     Liver Function Tests: Recent Labs  Lab 05/19/21 0920 05/20/21 0448 05/21/21 1700 05/21/21 2145 05/22/21 0321 05/23/21 0733  AST 57* 55*  --   --   --   --   ALT 20 25  --   --   --   --   ALKPHOS 76 68  --   --   --   --   BILITOT 0.9 0.6  --   --   --   --   PROT 5.9* 5.7*  --   --   --   --   ALBUMIN 2.4* 2.3*   < > 2.2* 2.2* 2.6*   < > = values in this interval not displayed.    CBC: Recent Labs  Lab 05/19/21 0920 05/19/21 1835 05/20/21 0315 05/20/21 0448 05/20/21 2340 05/21/21 1700 05/22/21 0321 05/23/21 0733 05/24/21 1256  WBC 10.5   < > 9.1   < > 10.6* 9.0 9.9 6.3 8.9  NEUTROABS 7.5  --  6.9  --   --   --   --   --   --   HGB 9.6*   < > 8.6*   < > 9.2* 8.2* 8.5* 8.5* 8.8*  HCT 29.6*   < > 27.3*   < > 28.5* 25.0* 26.2* 25.8* 29.0*  MCV 97.4   < > 96.8   < > 94.4 95.8 94.2 95.2 99.3  PLT 145*   < > 141*   < > 151 173 148* 210 209   < > =  values in this interval not displayed.    Medications:  sodium chloride      atorvastatin  40 mg Oral Daily   Chlorhexidine Gluconate Cloth  6 each Topical Q0600   darbepoetin (ARANESP) injection - DIALYSIS  100 mcg Intravenous Q Wed-HD   gabapentin  100 mg Oral QHS   midodrine  10 mg Oral TID   mirtazapine  7.5 mg Oral QHS   pantoprazole  40 mg Oral Q0600   sodium chloride flush  3 mL Intravenous Q12H   venlafaxine XR  75 mg Oral Q breakfast    Dialysis Orders:  AF MWF  3h 77mn 400/500    62.5kg    3K/2.5 Ca    P2  AVG HeRO  Hep none - Mircera 50 q 2 wks  (7/13)  Venofer 50 q wk - Parsabiv '5mg'$  IV q HD  Assessment/Plan: Melena/Rectal bleeding - GI consulted. Hx rectal ulcers.  EGD 7/27 normal and flex sig with mucosal ulceration c/w sterocoral ulcers, non bleeding.   AMS: Palliative care consulted to establish goals of care. Pt is now DNR, no artificial feeds, no colonscopy for now. PCT will reassess and meet w/ family again on Monday. Patient's mental status is very poor, she is hallucinating regularly. She has not made any improvement since admission.   ESRD: Continue HD per MWF schedule for now. Try to dialyze her in the chair. Discussions about benefits vs con's of aggressive Rx (e.g., HD) for this patient are in progress w/ PCT.  Next HD Monday.  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 Hypertension/volume  - BP soft. On midodrine for BP support.  Edema improving, UF as tolerated.  Under edw if weights correct. Anemia  - Hgb trending down, last 8.5. on ESA, weekly Fe. Continue Aranesp 152mg q Wed. Metabolic bone disease -  Corr Ca and phos ok.  Continue to monitor. No binders currently  Nutrition: Poorly eating per notes. Consider liberalized diet when tolerating PO.  Recent CVA    RKelly Splinter MD 05/25/2021, 1:10 PM

## 2021-05-25 NOTE — Progress Notes (Addendum)
PROGRESS NOTE    Amy [name]  [id number] DOB: 1946/01/07 DOA: 05/19/2021 PCP: [name], MD    Chief Complaint  Patient presents with   Rectal Bleeding    Brief Narrative:    Amy [name] is a 75 y.o. 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 Pt underwent EGD which was wnl and sigmoidoscopy showing stercoral ulcers and not a good prep. GI recommended colonoscopy for further evaluation but she would need NG TUBE for the prep. In view of her multiple co morbidities, recurrent admissions, palliative care consulted for goals of care discussions.  Patient seen and examined at bedside she is lethargic somnolent opens eyes to verbal cues and tactile cues.  As per the RN she has not had anything to eat , she is refusing food.  Assessment & Plan:   Principal Problem:   GI bleed Active Problems:   Essential hypertension   Type 2 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, with long-term current use of insulin (HCC)   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   ESRD on dialysis ([place])   Dyslipidemia   Hypokalemia   Stage 2 skin ulcer of sacral region ([place])   GI bleed/anemia of acute blood loss superimposed on anemia of chronic disease from ESRD.  Hemoccult positive GI consulted and  she underwent , EGD wnl, and flex sigmoidoscopy, showing Mucosal ulceration c/w Stercoral ulcers. Non bleeding. she will need colonoscopy.  In view of her advanced age, multiple co morbidities, multiple admissions for the rectal bleeding , palliative care consulted and family has requested no artificial feeding, no colonoscopy for now. Pt continued to decline, with minimal oral intake.  Transfuse to keep hemoglobin greater than 7. Hemoglobin remains stable around 8.8. no bleeding since admission.     End-stage renal disease on dialysis Nephrology consulted and HD as per nehrology.    Hypomagnesemia and hypokalemia  Replaced.    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   CVA with left hemiparesis.  Continue with lipitor.    Type 2 diabetes mellitus with end-stage renal disease on chronic dialysis, insulin-dependent Last A1c 6.9 Diet controlled Continue with sliding scale insulin for now CBG (last 3)  Recent Labs    05/25/21 0701 05/25/21 0722 05/25/21 1138  GLUCAP 173* 160* 143*   Minimal oral intake, continue with sliding scale insulin.   Hyperlipidemia Continue with statin    Hypotension  Resolved ,on midodrine at this time.  Stage 2 Pressure injury Pressure Injury 05/20/21 Coccyx Mid Stage 2 -  Partial thickness loss of dermis presenting as a shallow open injury with a red, pink wound bed without slough. 1X0.5X0 (Active)  05/20/21 1315  Location: Coccyx  Location Orientation: Mid  Staging: Stage 2 -  Partial thickness loss of dermis presenting as a shallow open injury with a red, pink wound bed without slough.  Wound Description (Comments): 1X0.5X0  Present on Admission: Yes  Wound care .    Acute metabolic encephalopathy/ deconditioning, failure to thrive:  Patient remains somnolent, minimal oral intake, refusing meds and meals.  As per ED notes/ SNF pt mental status is at baseline on admission.  But today she is more somnolent and family at bedside, and able to only recognize the family.  We will send  work up for evaluation of UTI.  CT head without contrast to see if she had another stroke.     Hypokalemia and hypophosphatemia:  Potassium and phosphorus improved on repeat labs    DVT prophylaxis: (scd's) Code Status: DNR Family Communication: (none at bedside. )  Called her son and updated.  Disposition:   Status is: Inpatient.   The patient will require care spanning > 2 midnights and should be  moved to inpatient because: Ongoing diagnostic testing needed not appropriate for outpatient work up, Unsafe d/c plan, and IV treatments appropriate due to intensity of illness or inability to take PO  Dispo: The patient is from: SNF              Anticipated d/c is to:  pending.              Patient currently is not medically stable to d/c.   Difficult to place patient No       Consultants:  Gastroenterology Nephrology.   Procedures: EGD,  Antimicrobials: none.    Subjective: Somnolent opens eyes on verbal cues.  Refusing food minimal oral intake  Objective: Vitals:   05/24/21 1742 05/24/21 2052 05/25/21 0454 05/25/21 1001  BP: 119/60 (!) 123/59 112/64 (!) 114/58  Pulse: 85 95 96 84  Resp: '18 16 16 16  '$ Temp: 97.9 F (36.6 C) 98.5 F (36.9 C) 98 F (36.7 C) 98 F (36.7 C)  TempSrc: Axillary Oral Oral Oral  SpO2: 97% 100% 100% 100%  Weight:      Height:        Intake/Output Summary (Last 24 hours) at 05/25/2021 1447 Last data filed at 05/25/2021 0800 Gross per 24 hour  Intake 180 ml  Output --  Net 180 ml    Filed Weights   05/21/21 1600 05/21/21 1928 05/23/21 0745  Weight: 59.5 kg 57 kg 57 kg    Examination:  General exam: Elderly woman, ill-appearing, no distress noted Respiratory system: Air entry fair bilateral no wheezing or rhonchi Cardiovascular system: S1-S2 heard, regular rate rhythm, no JVD no pedal edema Gastrointestinal system: Abdomen is soft nontender bowel sounds normal Central nervous system: Somnolent, confused Extremities: No pedal edema Skin: Stage II sacral pressure ulcer Psychiatry: Cannot be assessed    Data Reviewed: I have personally reviewed following labs and imaging studies  CBC: Recent Labs  Lab 05/19/21 0920 05/19/21 1835 05/20/21 0315 05/20/21 0448 05/20/21 2340 05/21/21 1700 05/22/21 0321 05/23/21 0733 05/24/21 1256  WBC 10.5   < > 9.1   < > 10.6* 9.0 9.9 6.3 8.9  NEUTROABS 7.5  --  6.9  --   --   --   --   --    --   HGB 9.6*   < > 8.6*   < > 9.2* 8.2* 8.5* 8.5* 8.8*  HCT 29.6*   < > 27.3*   < > 28.5* 25.0* 26.2* 25.8* 29.0*  MCV 97.4   < > 96.8   < > 94.4 95.8 94.2 95.2 99.3  PLT 145*   < > 141*   < > 151 173 148* 210 209   < > = values in this interval not displayed.     Basic Metabolic Panel: Recent Labs  Lab 05/19/21 0920 05/20/21 0448 05/21/21 1700 05/21/21 2145 05/22/21 0321 05/23/21 0733 05/24/21 1256 05/25/21 1241  NA 132*   < > 130* 133* 131* 132* 134*  --   K 2.8*   < > 2.7* 4.3 3.9 2.9* 4.1  --  CL 90*   < > 94* 97* 96* 95* 97*  --   CO2 31   < > 24 21* 21* 26 21*  --   GLUCOSE 211*   < > 121* 111* 127* 121* 96  --   BUN 18   < > '21 12 14 8 10  '$ --   CREATININE 5.30*   < > 4.77* 3.18* 3.51* 2.49* 3.97*  --   CALCIUM 7.6*   < > 7.6* 7.6* 7.5* 8.0* 8.1*  --   MG 1.9  --   --   --  1.9  --   --   --   PHOS  --   --  2.0* 2.4* 2.1* 1.0*  --  1.7*   < > = values in this interval not displayed.     GFR: Estimated Creatinine Clearance: 11.4 mL/min (A) (by C-G formula based on SCr of 3.97 mg/dL (H)).  Liver Function Tests: Recent Labs  Lab 05/19/21 0920 05/20/21 0448 05/21/21 1700 05/21/21 2145 05/22/21 0321 05/23/21 0733  AST 57* 55*  --   --   --   --   ALT 20 25  --   --   --   --   ALKPHOS 76 68  --   --   --   --   BILITOT 0.9 0.6  --   --   --   --   PROT 5.9* 5.7*  --   --   --   --   ALBUMIN 2.4* 2.3* 2.2* 2.2* 2.2* 2.6*     CBG: Recent Labs  Lab 05/24/21 1610 05/24/21 2140 05/25/21 0701 05/25/21 0722 05/25/21 1138  GLUCAP 157* 156* 173* 160* 143*      Recent Results (from the past 240 hour(s))  Resp Panel by RT-PCR (Flu A&B, Covid) Nasopharyngeal Swab     Status: None   Collection Time: 05/19/21 12:15 PM   Specimen: Nasopharyngeal Swab; Nasopharyngeal(NP) swabs in vial transport medium  Result Value Ref Range Status   SARS Coronavirus 2 by RT PCR NEGATIVE NEGATIVE Final    Comment: (NOTE) SARS-CoV-2 target nucleic acids are NOT  DETECTED.  The SARS-CoV-2 RNA is generally detectable in upper respiratory specimens during the acute phase of infection. The lowest concentration of SARS-CoV-2 viral copies this assay can detect is 138 copies/mL. A negative result does not preclude SARS-Cov-2 infection and should not be used as the sole basis for treatment or other patient management decisions. A negative result may occur with  improper specimen collection/handling, submission of specimen other than nasopharyngeal swab, presence of viral mutation(s) within the areas targeted by this assay, and inadequate number of viral copies(<138 copies/mL). A negative result must be combined with clinical observations, patient history, and epidemiological information. The expected result is Negative.  Fact Sheet for Patients:  EntrepreneurPulse.com.au  Fact Sheet for Healthcare Providers:  IncredibleEmployment.be  This test is no t yet approved or cleared by the [place] FDA and  has been authorized for detection and/or diagnosis of SARS-CoV-2 by FDA under an Emergency Use Authorization (EUA). This EUA will remain  in effect (meaning this test can be used) for the duration of the COVID-19 declaration under Section 564(b)(1) of the Act, 21 U.S.C.section 360bbb-3(b)(1), unless the authorization is terminated  or revoked sooner.       Influenza A by PCR NEGATIVE NEGATIVE Final   Influenza B by PCR NEGATIVE NEGATIVE Final    Comment: (NOTE) The Xpert Xpress SARS-CoV-2/FLU/RSV plus assay is intended  as an aid in the diagnosis of influenza from Nasopharyngeal swab specimens and should not be used as a sole basis for treatment. Nasal washings and aspirates are unacceptable for Xpert Xpress SARS-CoV-2/FLU/RSV testing.  Fact Sheet for Patients: EntrepreneurPulse.com.au  Fact Sheet for Healthcare Providers: IncredibleEmployment.be  This test is not yet  approved or cleared by the [place] FDA and has been authorized for detection and/or diagnosis of SARS-CoV-2 by FDA under an Emergency Use Authorization (EUA). This EUA will remain in effect (meaning this test can be used) for the duration of the COVID-19 declaration under Section 564(b)(1) of the Act, 21 U.S.C. section 360bbb-3(b)(1), unless the authorization is terminated or revoked.  Performed at [facility], [place] [address], [place], [place] [address]           Radiology Studies: No results found.      Scheduled Meds:  atorvastatin  40 mg Oral Daily   Chlorhexidine Gluconate Cloth  6 each Topical Q0600   darbepoetin (ARANESP) injection - DIALYSIS  100 mcg Intravenous Q Wed-HD   gabapentin  100 mg Oral QHS   midodrine  10 mg Oral TID   mirtazapine  7.5 mg Oral QHS   pantoprazole  40 mg Oral Q0600   sodium chloride flush  3 mL Intravenous Q12H   venlafaxine XR  75 mg Oral Q breakfast   Continuous Infusions:  sodium chloride       LOS: 5 days        Hosie Poisson, MD [facility]   To contact the attending provider between 7A-7P or the covering provider during after hours 7P-7A, please log into the web site [email or web address] and access using universal [place] password for that web site. If you do not have the password, please call the hospital operator.  05/25/2021, 2:47 PM    Addendum: Called son to update on the patient, he wanted [place] to call his family member and get a feeding tube for the patient. He reports that since the stroke , she hasn't been eating for more than 4 to 6 months now and feels that feeding tube will help improve her nutrition.  IR consulted for PEG placement.    Hosie Poisson, MD

## 2021-05-26 ENCOUNTER — Inpatient Hospital Stay (HOSPITAL_COMMUNITY)
Admit: 2021-05-26 | Discharge: 2021-05-26 | Disposition: A | Discharge status: Home / Self Care | Payer: Medicare Other | Attending: Internal Medicine | Admitting: Internal Medicine

## 2021-05-26 DIAGNOSIS — R4182 Altered mental status, unspecified: Secondary | ICD-10-CM

## 2021-05-26 DIAGNOSIS — Z992 Dependence on renal dialysis: Secondary | ICD-10-CM | POA: Diagnosis not present

## 2021-05-26 DIAGNOSIS — G9341 Metabolic encephalopathy: Secondary | ICD-10-CM | POA: Diagnosis not present

## 2021-05-26 DIAGNOSIS — K922 Gastrointestinal hemorrhage, unspecified: Secondary | ICD-10-CM | POA: Diagnosis not present

## 2021-05-26 DIAGNOSIS — R5383 Other fatigue: Secondary | ICD-10-CM

## 2021-05-26 DIAGNOSIS — N186 End stage renal disease: Secondary | ICD-10-CM | POA: Diagnosis not present

## 2021-05-26 DIAGNOSIS — E785 Hyperlipidemia, unspecified: Secondary | ICD-10-CM | POA: Diagnosis not present

## 2021-05-26 DIAGNOSIS — D638 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: Secondary | ICD-10-CM | POA: Diagnosis not present

## 2021-05-26 DIAGNOSIS — R627 Adult failure to thrive: Secondary | ICD-10-CM | POA: Diagnosis not present

## 2021-05-26 LAB — CBC WITH DIFFERENTIAL/PLATELET
Abs Immature Granulocytes: 0.2 10*3/uL — ABNORMAL HIGH (ref 0.00–0.07)
Basophils Absolute: 0.1 10*3/uL (ref 0.0–0.1)
Basophils Relative: 1 %
Eosinophils Absolute: 0.3 10*3/uL (ref 0.0–0.5)
Eosinophils Relative: 3 %
HCT: 26 % — ABNORMAL LOW (ref 36.0–46.0)
Hemoglobin: 8.4 g/dL — ABNORMAL LOW (ref 12.0–15.0)
Immature Granulocytes: 2 %
Lymphocytes Relative: 12 %
Lymphs Abs: 1.2 10*3/uL (ref 0.7–4.0)
MCH: 31 pg (ref 26.0–34.0)
MCHC: 32.3 g/dL (ref 30.0–36.0)
MCV: 95.9 fL (ref 80.0–100.0)
Monocytes Absolute: 0.9 10*3/uL (ref 0.1–1.0)
Monocytes Relative: 9 %
Neutro Abs: 7.8 10*3/uL — ABNORMAL HIGH (ref 1.7–7.7)
Neutrophils Relative %: 73 %
Platelets: 216 10*3/uL (ref 150–400)
RBC: 2.71 MIL/uL — ABNORMAL LOW (ref 3.87–5.11)
RDW: 19.5 % — ABNORMAL HIGH (ref 11.5–15.5)
WBC: 10.5 10*3/uL (ref 4.0–10.5)
nRBC: 0.6 % — ABNORMAL HIGH (ref 0.0–0.2)

## 2021-05-26 LAB — BASIC METABOLIC PANEL
Anion gap: 11 (ref 5–15)
BUN: 19 mg/dL (ref 8–23)
CO2: 27 mmol/L (ref 22–32)
Calcium: 8.1 mg/dL — ABNORMAL LOW (ref 8.9–10.3)
Chloride: 98 mmol/L (ref 98–111)
Creatinine, Ser: 5.62 mg/dL — ABNORMAL HIGH (ref 0.44–1.00)
GFR, Estimated: 7 mL/min — ABNORMAL LOW (ref >60)
Glucose, Bld: 106 mg/dL — ABNORMAL HIGH (ref 70–99)
Potassium: 3.5 mmol/L (ref 3.5–5.1)
Sodium: 136 mmol/L (ref 135–145)

## 2021-05-26 LAB — GLUCOSE, CAPILLARY
Glucose-Capillary: 137 mg/dL — ABNORMAL HIGH (ref 70–99)
Glucose-Capillary: 123 mg/dL — ABNORMAL HIGH (ref 70–99)
Glucose-Capillary: 143 mg/dL — ABNORMAL HIGH (ref 70–99)

## 2021-05-26 LAB — RAPID HIV SCREEN (HIV 1/2 AB+AG)
HIV 1/2 Antibodies: NONREACTIVE
HIV-1 P24 Antigen - HIV24: NONREACTIVE

## 2021-05-26 LAB — VITAMIN B12: Vitamin B-12: 1085 pg/mL — ABNORMAL HIGH (ref 180–914)

## 2021-05-26 LAB — TSH: TSH: 3.227 u[IU]/mL (ref 0.350–4.500)

## 2021-05-26 LAB — FOLATE: Folate: 15.3 ng/mL (ref >5.9)

## 2021-05-26 MED ORDER — THIAMINE HCL 100 MG/ML IJ SOLN
500.0000 mg | Freq: Three times a day (TID) | INTRAVENOUS | Status: AC
  Administered 2021-05-26 – 2021-05-28 (×5): 500 mg via INTRAVENOUS
  Filled 2021-05-26 (×6): qty 5

## 2021-05-26 MED ORDER — LACTULOSE 10 GM/15ML PO SOLN
20.0000 g | Freq: Two times a day (BID) | ORAL | Status: DC
  Administered 2021-05-26: 20 g via ORAL
  Filled 2021-05-26: qty 30

## 2021-05-26 MED ORDER — HEPARIN SODIUM (PORCINE) 5000 UNIT/ML IJ SOLN
5000.0000 [IU] | Freq: Three times a day (TID) | INTRAMUSCULAR | Status: DC
  Administered 2021-05-26 – 2021-06-06 (×26): 5000 [IU] via SUBCUTANEOUS
  Filled 2021-05-26 (×28): qty 1

## 2021-05-26 MED ORDER — SODIUM CHLORIDE 0.9 % IV SOLN
250.0000 mg | Freq: Every day | INTRAVENOUS | Status: AC
Start: 2021-05-28 — End: 2021-06-03
  Administered 2021-05-28 – 2021-06-02 (×5): 250 mg via INTRAVENOUS
  Filled 2021-05-26 (×6): qty 2.5

## 2021-05-26 MED ORDER — MIDODRINE HCL 5 MG PO TABS
ORAL_TABLET | ORAL | Status: AC
  Filled 2021-05-26: qty 2

## 2021-05-26 MED ORDER — THIAMINE HCL 100 MG/ML IJ SOLN
100.0000 mg | Freq: Every day | INTRAMUSCULAR | Status: DC
  Administered 2021-06-03 – 2021-06-11 (×8): 100 mg via INTRAVENOUS
  Filled 2021-05-26 (×8): qty 2

## 2021-05-26 NOTE — Plan of Care (Signed)
  Problem: Education: Goal: Knowledge of General Education information will improve Description: Including pain rating scale, medication(s)/side effects and non-pharmacologic comfort measures Outcome: Progressing   Problem: Clinical Measurements: Goal: Ability to maintain clinical measurements within normal limits will improve Outcome: Progressing Goal: Will remain free from infection Outcome: Progressing   Problem: Nutrition: Goal: Adequate nutrition will be maintained Outcome: Progressing

## 2021-05-26 NOTE — Progress Notes (Signed)
SLP Cancellation Note  Patient Details Name: Amy [name] MRN: [id number] DOB: 12-29-1945   Cancelled treatment:       Reason Eval/Treat Not Completed: Patient at procedure or test/unavailable (Pt at HD at this time. SLP will follow up.)  Jeanenne Licea I. Hardin Negus, [place], [place] Office number [phone] Pager [phone]  Horton Marshall 05/26/2021, 9:56 AM

## 2021-05-26 NOTE — Evaluation (Signed)
Clinical/Bedside Swallow Evaluation Patient Details  Name: Amy [name] MRN: [id number] Date of Birth: [birth year]  Today's Date: 05/26/2021 Time: SLP Start Time (ACUTE ONLY): 57 SLP Stop Time (ACUTE ONLY): 1318 SLP Time Calculation (min) (ACUTE ONLY): 16 min  Past Medical History:  Past Medical History:  Diagnosis Date   Chronic kidney disease    dialysis M-W-F - dialysis cath located in right side of chest   Diabetes mellitus without complication ([place])    type 2 - no meds    HLD (hyperlipidemia)    diet controlled   Hypertension    no meds   Past Surgical History:  Past Surgical History:  Procedure Laterality Date   A/V FISTULAGRAM Left 01/10/2021   Procedure: A/V FISTULAGRAM;  Surgeon: Angelia Mould, MD;  Location: [place] [facility];  Service: Cardiovascular;  Laterality: Left;   A/V FISTULAGRAM N/A 01/27/2021   Procedure: A/V FISTULAGRAM;  Surgeon: Waynetta Sandy, MD;  Location: [facility];  Service: Cardiovascular;  Laterality: N/A;   AV FISTULA PLACEMENT Left 09/24/2020   Procedure: LEFT UPPER ARM ARTERIOVENOUS GORTEX  GRAFT;  Surgeon: Elam Dutch, MD;  Location: [place];  Service: Vascular;  Laterality: Left;   BREAST CYST EXCISION Right 2011   cyst removed -neg   ESOPHAGOGASTRODUODENOSCOPY (EGD) WITH PROPOFOL N/A 05/21/2021   Procedure: ESOPHAGOGASTRODUODENOSCOPY (EGD) WITH PROPOFOL;  Surgeon: Jackquline Denmark, MD;  Location: [facility] ENDOSCOPY;  Service: Endoscopy;  Laterality: N/A;   EYE SURGERY Bilateral    cataracts   FLEXIBLE SIGMOIDOSCOPY N/A 05/21/2021   Procedure: FLEXIBLE SIGMOIDOSCOPY;  Surgeon: Jackquline Denmark, MD;  Location: [facility] ENDOSCOPY;  Service: Endoscopy;  Laterality: N/A;   INSERTION OF DIALYSIS CATHETER Right 07/2019   PERIPHERAL VASCULAR BALLOON ANGIOPLASTY  01/10/2021   Procedure: PERIPHERAL VASCULAR BALLOON ANGIOPLASTY;  Surgeon: Angelia Mould, MD;  Location: [place] [facility];  Service: Cardiovascular;;  left AV graft    TUBAL LIGATION     Ultrasound-guided cannulation left upper arm AV graft  01/27/2021   HPI:  Pt is a 75 y.o. female who presented to ER for dark stool with + fecal occult. EGD 7/27: normal without evidence of GI bleed. Palliative care consulted and code status has been changed to DNR. 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   Assessment / Plan / Recommendation Clinical Impression  Pt was seen for bedside swallow evaluation. Pt stated that she likes the Nepro supplements, but she did not provide any further information regarding her swallowing despite cues. Oral mechanism exam was limited due to pt's difficulty following some commands; however, oral motor strength and ROM appeared grossly [facility] and she presented with reduced dentition. She demonstrated prolonged and inconsistent mastication of regular texture solids with mild to moderate oral residue which was cleared with a liquid wash. Her swallow mechanism appeared otherwise WFL without s/sx of aspiration. A dysphagia 2 diet with thin liquids is recommended at this time. Pt's case was discussed with Dr. Karleen Hampshire to ensure that there was no current medical reason for pt still being on clear liquids and Dr. Karleen Hampshire was in agreement with diet advancement. SLP will follow to assess diet tolerance and for diet advancement as clinically indicated. SLP Visit Diagnosis: Dysphagia, unspecified (R13.10)    Aspiration Risk  Mild aspiration risk  Diet Recommendation Dysphagia 2 (Fine chop);Thin liquid   Liquid Administration via: Cup;Straw Medication Administration: Whole meds with puree Compensations: Small sips/bites;Minimize environmental distractions Postural Changes: Seated upright at 90 degrees    Other  Recommendations Oral Care Recommendations:  Oral care BID   Follow up Recommendations  (TBD)      Frequency and Duration min 2x/week  2 weeks       Prognosis Prognosis for Safe Diet Advancement: Fair Barriers to Reach Goals: Cognitive deficits      Swallow Study   General Date of Onset: 05/25/21 HPI: Pt is a 75 y.o. female who presented to ER for dark stool with + fecal occult. EGD 7/27: normal without evidence of GI bleed. Palliative care consulted and code status has been changed to DNR. 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 Type of Study: Bedside Swallow Evaluation Previous Swallow Assessment: none Diet Prior to this Study: Thin liquids (clear liquids) Temperature Spikes Noted: No Respiratory Status: Nasal cannula History of Recent Intubation: No Behavior/Cognition: Cooperative;Pleasant mood Oral Cavity Assessment: Within Functional Limits Oral Care Completed by SLP: No Oral Cavity - Dentition: Adequate natural dentition Self-Feeding Abilities: Needs assist Patient Positioning: Upright in bed;Postural control adequate for testing Baseline Vocal Quality: Normal Volitional Cough: Cognitively unable to elicit Volitional Swallow: Unable to elicit    Oral/Motor/Sensory Function Overall Oral Motor/Sensory Function:  (difficult to assess)   Ice Chips Ice chips: Within functional limits Presentation: Spoon   Thin Liquid Thin Liquid: Within functional limits Presentation: Straw    Nectar Thick Nectar Thick Liquid: Not tested   Honey Thick Honey Thick Liquid: Not tested   Puree Puree: Within functional limits Presentation: Spoon   Solid     Solid: Impaired Oral Phase Impairments: Impaired mastication;Poor awareness of bolus ; oral residue    Harleigh I. Hardin Negus, [place], [place] Office number [phone] Pager [phone]  Horton Marshall 05/26/2021,2:11 PM

## 2021-05-26 NOTE — Progress Notes (Signed)
EEG complete - results pending 

## 2021-05-26 NOTE — Procedures (Signed)
Patient was seen on dialysis and the procedure was supervised.  BFR 400  Via AVG BP is  105/68.   Patient appears to not be tolerating treatment well-  I have discussed with palliative   Amy [name] 05/26/2021

## 2021-05-26 NOTE — Progress Notes (Signed)
PROGRESS NOTE    Amy [name]  [id number] DOB: September 23, 1946 DOA: 05/19/2021 PCP: [name], MD    Chief Complaint  Patient presents with   Rectal Bleeding    Brief Narrative:    Amy [name] is a 75 y.o. 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 Pt underwent EGD which was wnl and sigmoidoscopy showing stercoral ulcers and not a good prep. GI recommended colonoscopy for further evaluation but she would need NG TUBE for the prep. In view of her multiple co morbidities, recurrent admissions, palliative care consulted for goals of care discussions.  Patient seen and examined this morning she is alert and screaming "to take the girl out of her face /the child out of her face".   Assessment & Plan:   Principal Problem:   GI bleed Active Problems:   Essential hypertension   Type 2 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, with long-term current use of insulin (HCC)   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   ESRD on dialysis ([place])   Dyslipidemia   Hypokalemia   Stage 2 skin ulcer of sacral region ([place])   GI bleed/anemia of acute blood loss superimposed on anemia of chronic disease from ESRD.  Hemoccult positive GI consulted and  she underwent , EGD wnl, and flex sigmoidoscopy, showing Mucosal ulceration c/w Stercoral ulcers. Non bleeding. she will need colonoscopy.  In view of her advanced age, multiple co morbidities, multiple admissions for the rectal bleeding , palliative care consulted and family has requested no artificial feeding, no colonoscopy for now. Pt continued to decline, with minimal oral intake.  Transfuse to keep hemoglobin greater than 7. Hemoglobin remains stable around 8.8. no bleeding since admission.  As per gastroenterology patient can be restarted on  Eliquis. Will hold eliquis until after the PEG placement.    End-stage renal disease on dialysis Nephrology consulted and HD as per nehrology.    Hypomagnesemia and hypokalemia  Replaced.    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  Continue with lipitor.    Type 2 diabetes mellitus with end-stage renal disease on chronic dialysis, insulin-dependent Last A1c 6.9 Diet controlled Continue with sliding scale insulin for now CBG (last 3)  Recent Labs    05/25/21 2137 05/26/21 0805 05/26/21 1303  GLUCAP 103* 137* 123*   Minimal oral intake, continue with sliding scale insulin. Therapy evaluation requested recommended patient on dysphagia 2 diet.  Hyperlipidemia Continue with statin    Hypotension  Resolved ,on midodrine at this time.  Stage 2 Pressure injury Pressure Injury 05/20/21 Coccyx Mid Stage 2 -  Partial thickness loss of dermis presenting as a shallow open injury with a red, pink wound bed without slough. 1X0.5X0 (Active)  05/20/21 1315  Location: Coccyx  Location Orientation: Mid  Staging: Stage 2 -  Partial thickness loss of dermis presenting as a shallow open injury with a red, pink wound bed without slough.  Wound Description (Comments): 1X0.5X0  Present on Admission: Yes  Wound care .    Acute metabolic encephalopathy/ deconditioning, failure to thrive:  As per ED notes/ SNF pt mental status is at baseline on admission.  We will send work up for evaluation of UTI.  CT head without contrast shows  multiple strokes when compared to the CT done in March 2022.  Discussed the results of the CT with the patient's son, who said he is aware of the patient's previous strokes EEG ordered showed cortical dysfunction arising from right hemisphere secondary to underlying structural abnormality/stroke.  Moderate diffuse encephalopathy of nonspecific etiology.  No seizures or epileptiform discharges  were seen throughout the recording. Ammonia level elevated, lactulose ordered patient has poor oral intake in the last 4 months.  More so in the last few weeks TSH ordered and pending vitamin B12 and folate within normal limits    Failure to thrive  As per the patient's son she has not been eating well for the last 4 months he would like a PEG placement at this time IR consulted for PEG placement.   Hypokalemia and hypophosphatemia:  Potassium and phosphorus improved on repeat labs    DVT prophylaxis: Heparin.  Code Status: DNR Family Communication: (none at bedside. )  Called her son and updated today.  Disposition:   Status is: Inpatient.   The patient will require care spanning > 2 midnights and should be moved to inpatient because: Ongoing diagnostic testing needed not appropriate for outpatient work up, Unsafe d/c plan, and IV treatments appropriate due to intensity of illness or inability to take PO  Dispo: The patient is from: SNF              Anticipated d/c is to:  pending.              Patient currently is not medically stable to d/c.   Difficult to place patient No       Consultants:  Gastroenterology Nephrology.   Procedures: EGD,  Antimicrobials: none.    Subjective:  Patient confused and having hallucinations. Objective: Vitals:   05/26/21 1200 05/26/21 1212 05/26/21 1222 05/26/21 1302  BP: (!) 109/55 138/61 128/63 (!) 114/50  Pulse: (!) 102 95 95 97  Resp:  '14 14 17  '$ Temp:  98.9 F (37.2 C) 98.9 F (37.2 C)   TempSrc:  Oral Oral   SpO2:   99% 100%  Weight:   58.5 kg   Height:        Intake/Output Summary (Last 24 hours) at 05/26/2021 1817 Last data filed at 05/26/2021 1222 Gross per 24 hour  Intake 120 ml  Output -500 ml  Net 620 ml    Filed Weights   05/26/21 0702 05/26/21 0850 05/26/21 1222  Weight: 56.8 kg 58 kg 58.5 kg    Examination:  General exam: Elderly woman, ill-appearing, restless but not in distress Respiratory  system: Air entry fair no wheezing heard Cardiovascular system: S1-S2 heard regular rate rhythm no pedal edema aortic Gastrointestinal system: Abdomen is soft nontender bowel sounds normal Central nervous system: Alert, oriented to person only.  Reports wants to get out of bed and take a bath.  Left hemiparesis Extremities: No pedal edema Skin: Stage II sacral pressure ulcer Psychiatry: Cannot be assessed alert    Data Reviewed: I have personally reviewed following labs and imaging studies  CBC: Recent Labs  Lab 05/20/21 0315 05/20/21 0448 05/21/21 1700 05/22/21 0321 05/23/21 0733 05/24/21 1256 05/26/21 0334  WBC 9.1   < > 9.0 9.9 6.3 8.9 10.5  NEUTROABS 6.9  --   --   --   --   --  7.8*  HGB 8.6*   < > 8.2* 8.5* 8.5* 8.8* 8.4*  HCT 27.3*   < > 25.0* 26.2* 25.8* 29.0* 26.0*  MCV 96.8   < > 95.8 94.2 95.2 99.3 95.9  PLT 141*   < > 173 148* 210 209 216   < > = values in this interval not displayed.     Basic Metabolic Panel: Recent Labs  Lab 05/21/21 1700 05/21/21 2145 05/22/21 0321 05/23/21 0733 05/24/21 1256 05/25/21 1241 05/26/21 0334  NA 130* 133* 131* 132* 134*  --  136  K 2.7* 4.3 3.9 2.9* 4.1  --  3.5  CL 94* 97* 96* 95* 97*  --  98  CO2 24 21* 21* 26 21*  --  27  GLUCOSE 121* 111* 127* 121* 96  --  106*  BUN '21 12 14 8 10  '$ --  19  CREATININE 4.77* 3.18* 3.51* 2.49* 3.97*  --  5.62*  CALCIUM 7.6* 7.6* 7.5* 8.0* 8.1*  --  8.1*  MG  --   --  1.9  --   --   --   --   PHOS 2.0* 2.4* 2.1* 1.0*  --  1.7*  --      GFR: Estimated Creatinine Clearance: 8.2 mL/min (A) (by C-G formula based on SCr of 5.62 mg/dL (H)).  Liver Function Tests: Recent Labs  Lab 05/20/21 0448 05/21/21 1700 05/21/21 2145 05/22/21 0321 05/23/21 0733  AST 55*  --   --   --   --   ALT 25  --   --   --   --   ALKPHOS 68  --   --   --   --   BILITOT 0.6  --   --   --   --   PROT 5.7*  --   --   --   --   ALBUMIN 2.3* 2.2* 2.2* 2.2* 2.6*     CBG: Recent Labs  Lab  05/25/21 1138 05/25/21 1728 05/25/21 2137 05/26/21 0805 05/26/21 1303  GLUCAP 143* 106* 103* 137* 123*      Recent Results (from the past 240 hour(s))  Resp Panel by RT-PCR (Flu A&B, Covid) Nasopharyngeal Swab     Status: None   Collection Time: 05/19/21 12:15 PM   Specimen: Nasopharyngeal Swab; Nasopharyngeal(NP) swabs in vial transport medium  Result Value Ref Range Status   SARS Coronavirus 2 by RT PCR NEGATIVE NEGATIVE Final    Comment: (NOTE) SARS-CoV-2 target nucleic acids are NOT DETECTED.  The SARS-CoV-2 RNA is generally detectable in upper respiratory specimens during the acute phase of infection. The lowest concentration of SARS-CoV-2 viral copies this assay can detect is 138 copies/mL. A negative result does not preclude SARS-Cov-2 infection and should not be used as the sole basis for treatment or other patient management decisions. A negative result may occur with  improper specimen collection/handling, submission of specimen other than nasopharyngeal swab, presence of viral mutation(s) within the areas targeted by this assay, and inadequate number of viral copies(<138 copies/mL). A negative result must be combined with clinical observations, patient history, and epidemiological information. The expected result is Negative.  Fact Sheet for Patients:  EntrepreneurPulse.com.au  Fact Sheet for Healthcare Providers:  IncredibleEmployment.be  This test is no t yet approved or cleared by the [place] FDA and  has been authorized for detection and/or diagnosis of SARS-CoV-2 by FDA under an Emergency Use Authorization (EUA). This EUA will remain  in effect (meaning this test can be used) for the duration of the COVID-19 declaration under Section 564(b)(1) of the Act, 21 U.S.C.section 360bbb-3(b)(1), unless the authorization is terminated  or revoked sooner.  Influenza A by PCR NEGATIVE NEGATIVE Final   Influenza B by  PCR NEGATIVE NEGATIVE Final    Comment: (NOTE) The Xpert Xpress SARS-CoV-2/FLU/RSV plus assay is intended as an aid in the diagnosis of influenza from Nasopharyngeal swab specimens and should not be used as a sole basis for treatment. Nasal washings and aspirates are unacceptable for Xpert Xpress SARS-CoV-2/FLU/RSV testing.  Fact Sheet for Patients: EntrepreneurPulse.com.au  Fact Sheet for Healthcare Providers: IncredibleEmployment.be  This test is not yet approved or cleared by the [place] FDA and has been authorized for detection and/or diagnosis of SARS-CoV-2 by FDA under an Emergency Use Authorization (EUA). This EUA will remain in effect (meaning this test can be used) for the duration of the COVID-19 declaration under Section 564(b)(1) of the Act, 21 U.S.C. section 360bbb-3(b)(1), unless the authorization is terminated or revoked.  Performed at [facility], [place] [address], [place], [place] [address]           Radiology Studies: CT HEAD WO CONTRAST  Result Date: 05/25/2021 CLINICAL DATA:  Mental status change, unknown cause. EXAM: CT HEAD WITHOUT CONTRAST TECHNIQUE: Contiguous axial images were obtained from the base of the skull through the vertex without intravenous contrast. COMPARISON:  Prior head CT examinations 01/18/2021 and earlier. 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 Associated subtle ex vacuo dilatation of the right lateral ventricle. Mild-to-moderate generalized cerebral atrophy. Comparatively mild cerebellar atrophy. An infarct is also now present within the right thalamus and adjacent posterior limb of right internal capsule, new as compared to the prior head CT, but age-indeterminate (although favored chronic). Chronic appearing lacunar infarct within the left thalamus, not  definitively present on the prior examination. Redemonstrated chronic lacunar infarct within the left basal ganglia. Background moderate patchy and ill-defined hypoattenuation within the cerebral white matter, nonspecific but compatible with chronic small vessel ischemic disease. There is no acute intracranial hemorrhage. No extra-axial fluid collection. No evidence of an intracranial mass. No midline shift. Vascular: No hyperdense vessel.  Atherosclerotic calcifications. Skull: Normal. Negative for fracture or focal lesion. Sinuses/Orbits: Visualized orbits show no acute finding. Trace scattered paranasal sinus mucosal thickening at the imaged levels. Other: Large right middle ear/mastoid effusion. A small left mastoid effusion is also present. Partially imaged right maxillofacial soft tissue swelling. 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 An infarct is also now present within the right thalamus and adjacent posterior limb of right internal capsule, new as compared to the prior head CT, but age-indeterminate (although favored chronic). Chronic appearing lacunar infarct within the left thalamus, not definitively present on the prior examination. Redemonstrated chronic lacunar infarct within the left basal ganglia. Background moderate chronic small vessel ischemic changes within the cerebral white matter. Mild-to-moderate generalized cerebral atrophy with comparatively mild cerebellar atrophy. Large right middle ear/mastoid effusion. A small left mastoid effusion is also present. Partially imaged right maxillofacial soft tissue swelling, nonspecific. Electronically Signed   By: Kellie Simmering DO   On: 05/25/2021 18:28   EEG adult  Result Date: 05/26/2021 Lora Havens, MD     05/26/2021  4:36 PM Patient Name: DAJAE WHITELOW MRN: [id number] Epilepsy Attending: Lora Havens  Referring Physician/Provider: Dr Hosie Poisson Date: 05/26/2021 Duration: 28.44 mins Patient history: 75 year old female with prior strokes with altered mental status.  EEG to evaluate for seizures. Level of alertness: Awake AEDs during EEG study: GBP Technical aspects: This EEG study was done with scalp electrodes positioned according to the 10-20 International system of electrode placement. Electrical activity was acquired at a sampling rate of '500Hz'$  and reviewed with a high frequency filter of '70Hz'$  and a low frequency filter of '1Hz'$ . EEG data were recorded continuously and digitally stored. Description: This EEG study was done with scalp electrodes positioned according to the 10-20 International system of electrode placement. Electrical activity was acquired at a sampling rate of '500Hz'$  and reviewed with a high frequency filter of '70Hz'$  and a low frequency filter of '1Hz'$ . EEG data were recorded continuously and digitally stored. Description: EEG showed continuous low amplitude 2 to 3 Hz delta slowing in right hemisphere as well as 5 to 6 Hz theta slowing in left hemisphere. Hyperventilation and photic stimulation were not performed.   ABNORMALITY -Continuous slow, generalized and lateralized right hemisphere IMPRESSION: This study is suggestive of cortical dysfunction arising from right hemisphere likely secondary to underlying structural abnormality/stroke.  There is also moderate diffuse encephalopathy, nonspecific etiology.  No seizures or epileptiform discharges were seen throughout the recording. Priyanka Barbra Sarks        Scheduled Meds:  atorvastatin  40 mg Oral Daily   Chlorhexidine Gluconate Cloth  6 each Topical Q0600   darbepoetin (ARANESP) injection - DIALYSIS  100 mcg Intravenous Q Wed-HD   gabapentin  100 mg Oral QHS   lactulose  20 g Oral BID   midodrine  10 mg Oral TID   mirtazapine  7.5 mg Oral QHS   pantoprazole  40 mg Oral Q0600   sodium chloride flush  3 mL Intravenous Q12H   [START ON  06/03/2021] thiamine injection  100 mg Intravenous Daily   venlafaxine XR  75 mg Oral Q breakfast   Continuous Infusions:  sodium chloride     thiamine injection     Followed by   Derrill Memo ON 05/28/2021] thiamine injection       LOS: 6 days        Hosie Poisson, MD [facility]   To contact the attending provider between 7A-7P or the covering provider during after hours 7P-7A, please log into the web site [email or web address] and access using universal [place] password for that web site. If you do not have the password, please call the hospital operator.  05/26/2021, 6:17 PM

## 2021-05-26 NOTE — Consult Note (Signed)
Neurology Consultation Reason for Consult: Altered mental status Referring Physician: Karleen Hampshire, V  CC: Altered mental status  History is obtained from: Son, chart review  HPI: Amy [name] is a 75 y.o. female with a history of ESRD on dialysis, diabetes, hypertension, hyperlipidemia who was admitted in May with ischemic stroke in outside hospital.  Since that time, she has had significant left-sided weakness and persistent neglect.  Over the past 3 weeks, she has had worsening encephalopathy, confusion.  She has been talking to people that are not there and having some hallucinations.  She then began having GI bleeding with dark stool that was fecal occult positive.  She was taking Eliquis because of a clot related to her fistula.  EGD was normal, flex sig some distal rectal ulcers.  She has been failing to thrive since her previous admission with decreased p.o. intake.  Her mental status is significantly worsened over the past 3 weeks, however.  ROS: Unable to obtain due to altered mental status.   Past Medical History:  Diagnosis Date   Chronic kidney disease    dialysis M-W-F - dialysis cath located in right side of chest   Diabetes mellitus without complication (HCC)    type 2 - no meds    HLD (hyperlipidemia)    diet controlled   Hypertension    no meds     Family History  Problem Relation Age of Onset   Breast cancer Neg Hx      Social History:  reports that she has quit smoking. She has never used smokeless tobacco. She reports current alcohol use. She reports previous drug use. Drug: Marijuana.   Exam: Current vital signs: BP 135/68 (BP Location: Right Arm)   Pulse 85   Temp 98.9 F (37.2 C) (Oral)   Resp 17   Ht '5\' 7"'$  (1.702 m)   Wt 58.5 kg   LMP  (LMP Unknown)   SpO2 95%   BMI 20.20 kg/m  Vital signs in last 24 hours: Temp:  [97.8 F (36.6 C)-98.9 F (37.2 C)] 98.9 F (37.2 C) (08/01 1222) Pulse Rate:  [85-108] 85 (08/01 1849) Resp:  [14-17] 17  (08/01 1849) BP: (104-138)/(50-69) 135/68 (08/01 1849) SpO2:  [95 %-100 %] 95 % (08/01 1849) Weight:  [56.8 kg-58.5 kg] 58.5 kg (08/01 1222)   Physical Exam  Constitutional: Appears well-developed and well-nourished.  Psych: She is irritable, having a conversation with someone who is not present and talking about me in the third person. Eyes: No scleral injection HENT: No OP obstruction MSK: no joint deformities.  Cardiovascular: Normal rate and regular rhythm.  Respiratory: Effort normal, non-labored breathing GI: Soft.  No distension. There is no tenderness.  Skin: WDI  Neuro: Mental Status: Patient is awake, alert, oriented to person, place, month, year. She has a dense hemineglect and in fact states that her left side is fine without problem Cranial Nerves: II: She has a dense left hemianopia. Pupils are equal, round, and reactive to light.   III,IV, VI: Right gaze deviation, unable to cross midline V: Facial sensation is diminished on the left VII: Facial movement with left facial weakness Motor: She has a severe left hemiparesis with spasticity of the left arm she minimally withdraws the left leg to noxious stimulation Sensory: Sensation is diminished on the left, when pinching her left toe she senses it is coming to her hand, when pinching her left arm she senses it as being on the chest. Cerebellar: She refuses to perform  I have reviewed labs in epic and the results pertinent to this consultation are: Ammonia 61 B12 > 1000 TSH is normal Creatinine is 5.6 with a BUN of 19 Calcium 8.1 She is anemic with a hemoglobin of 8.4 No leukocytosis  I have reviewed the images obtained: CT head-chronic right parietal infarct  Impression: 75 year old female with right-sided infarct 2 months ago with worsening delirium type symptoms over the past few weeks.  Her degree of neglect is more than I would expect for having the stroke a couple of months ago, possibilities  include recrudescence in the setting of her acute physiological stressors (GI bleeding and hyperammonemia), or extension of her previous stroke.  I would favor getting an MRI, MRA but if negative I would favor her overall medical status as being a larger contributor.  With her poor reported p.o. intake, I agree with high-dose thiamine supplementation pending B1 level.  Recommendations: 1) agree with high-dose thiamine 2) MRI brain, MRA head 3) B1 level, RPR pending 4) neurology will continue to follow   Roland Rack, MD [facility] [phone]  If 7pm- 7am, please page neurology on call as listed in [place].

## 2021-05-26 NOTE — Procedures (Addendum)
Patient Name: ADELINE RANEY  MRN: [id number]  Epilepsy Attending: Lora Havens  Referring Physician/Provider: Dr Hosie Poisson Date: 05/26/2021 Duration: 28.44 mins  Patient history: 75 year old female with prior strokes with altered mental status.  EEG to evaluate for seizures.  Level of alertness: Awake  AEDs during EEG study: GBP  Technical aspects: This EEG study was done with scalp electrodes positioned according to the 10-20 International system of electrode placement. Electrical activity was acquired at a sampling rate of '500Hz'$  and reviewed with a high frequency filter of '70Hz'$  and a low frequency filter of '1Hz'$ . EEG data were recorded continuously and digitally stored.   Description: This EEG study was done with scalp electrodes positioned according to the 10-20 International system of electrode placement. Electrical activity was acquired at a sampling rate of '500Hz'$  and reviewed with a high frequency filter of '70Hz'$  and a low frequency filter of '1Hz'$ . EEG data were recorded continuously and digitally stored.   Description: EEG showed continuous low amplitude 2 to 3 Hz delta slowing in right hemisphere as well as 5 to 6 Hz theta slowing in left hemisphere. Hyperventilation and photic stimulation were not performed.     ABNORMALITY -Continuous slow, generalized and lateralized right hemisphere  IMPRESSION: This study is suggestive of cortical dysfunction arising from right hemisphere likely secondary to underlying structural abnormality/stroke.  There is also moderate diffuse encephalopathy, nonspecific etiology.  No seizures or epileptiform discharges were seen throughout the recording.  [place] Ramone Barbra Sarks

## 2021-05-26 NOTE — Progress Notes (Signed)
Patient ID: [name], female   DOB: 20-Jan-1946, 75 y.o.   MRN: [id number]    Progress Note from the Palliative Medicine Team at [facility]   Patient Name: Amy [name]        Date: 05/26/2021 DOB: Aug 02, 1946  Age: 75 y.o. MRN#: [id number] Attending Physician: Hosie Poisson, MD Primary Care Physician: [name], MD Admit Date: 05/19/2021   Medical records reviewed, spoke to attending, assessed patient in HD.  Patient is lethargic, non verbal currently and unable to follow commands.   This NP spoke to son/Brian by phone as a follow up to last week's conversations and to further clarify [place] his conversation with attending over the past few days specific to neurologic evaluation.   He continues to verbalize concern over his mothers nutritional status and inability to take in adequate fluids and nutrition.[name]   He is considering artificial feeding/PEG.   Education offered on risks and benefits of artificial feeding/ PEG tube, and patient's high risk for aspiration secondary to lethargy.  Education offered again today regarding the difference between an aggressive medical intervention path and a palliative comfort path  Plan of care: -DNR/DNI -no artificial feeding for now, continue conversation with family members and medical team -continue to treat the treatable, family continues to hope for improvement  Again stressed the importance of continued conversation with his family and the medical providers regarding overall plan of care and treatment options,  ensuring decisions are within the context of the patients values and GOCs.  Questions and concerns addressed   Discussed with Dr  Karleen Hampshire  Total time spent was 35 minutes  Greater than 50% of the time was spent in counseling and coordination of care  Wadie Lessen NP  Palliative Medicine Team Team Phone # [phone] Pager [phone]

## 2021-05-26 NOTE — Progress Notes (Addendum)
[facility] Progress Note   Subjective: Seen in room, eyes open deviated to R side. Repeating "Oh, Oh, Oh....." no following commands. Preparing to go to HD.  I saw her in HD-  [name]"  I feel OK  Objective Vitals:   05/25/21 1727 05/25/21 2110 05/26/21 0536 05/26/21 0702  BP: 132/68 (!) 114/57 126/62   Pulse: 96 94 99   Resp: '18 15 16   '$ Temp: 97.7 F (36.5 C) 97.8 F (36.6 C) 98.3 F (36.8 C)   TempSrc:  Oral Oral   SpO2: 100% 100% 100%   Weight:    56.8 kg  Height:       Physical Exam General: Chronically ill appearing female in NAD Heart:S1,S2 no M/R/G Lungs: CTAB No WOB Abdomen: NABS Extremities: No LE edema. UE edema with contractures present  Dialysis Access: L AVG +T/B    Additional Objective Labs: Basic Metabolic Panel: Recent Labs  Lab 05/22/21 0321 05/23/21 0733 05/24/21 1256 05/25/21 1241 05/26/21 0334  NA 131* 132* 134*  --  136  K 3.9 2.9* 4.1  --  3.5  CL 96* 95* 97*  --  98  CO2 21* 26 21*  --  27  GLUCOSE 127* 121* 96  --  106*  BUN '14 8 10  '$ --  19  CREATININE 3.51* 2.49* 3.97*  --  5.62*  CALCIUM 7.5* 8.0* 8.1*  --  8.1*  PHOS 2.1* 1.0*  --  1.7*  --    Liver Function Tests: Recent Labs  Lab 05/19/21 0920 05/20/21 0448 05/21/21 1700 05/21/21 2145 05/22/21 0321 05/23/21 0733  AST 57* 55*  --   --   --   --   ALT 20 25  --   --   --   --   ALKPHOS 76 68  --   --   --   --   BILITOT 0.9 0.6  --   --   --   --   PROT 5.9* 5.7*  --   --   --   --   ALBUMIN 2.4* 2.3*   < > 2.2* 2.2* 2.6*   < > = values in this interval not displayed.   No results for input(s): LIPASE, AMYLASE in the last 168 hours. CBC: Recent Labs  Lab 05/19/21 0920 05/19/21 1835 05/20/21 0315 05/20/21 0448 05/21/21 1700 05/22/21 0321 05/23/21 0733 05/24/21 1256 05/26/21 0334  WBC 10.5   < > 9.1   < > 9.0 9.9 6.3 8.9 10.5  NEUTROABS 7.5  --  6.9  --   --   --   --   --  7.8*  HGB 9.6*   < > 8.6*   < > 8.2* 8.5* 8.5* 8.8* 8.4*   HCT 29.6*   < > 27.3*   < > 25.0* 26.2* 25.8* 29.0* 26.0*  MCV 97.4   < > 96.8   < > 95.8 94.2 95.2 99.3 95.9  PLT 145*   < > 141*   < > 173 148* 210 209 216   < > = values in this interval not displayed.   Blood Culture    Component Value Date/Time   SDES URINE, CATHETERIZED 05/02/2021 0022   SPECREQUEST NONE 05/02/2021 0022   CULT  05/02/2021 0022    NO GROWTH Performed at [facility], [place] [address], [place], [place] [address]    REPTSTATUS 05/04/2021 FINAL 05/02/2021 0022    Cardiac Enzymes: No results for input(s): CKTOTAL,  CKMB, CKMBINDEX, TROPONINI in the last 168 hours. CBG: Recent Labs  Lab 05/25/21 0722 05/25/21 1138 05/25/21 1728 05/25/21 2137 05/26/21 0805  GLUCAP 160* 143* 106* 103* 137*   Iron Studies: No results for input(s): IRON, TIBC, TRANSFERRIN, FERRITIN in the last 72 hours. '@lablastinr3'$ @ Studies/Results: CT HEAD WO CONTRAST  Result Date: 05/25/2021 CLINICAL DATA:  Mental status change, unknown cause. EXAM: CT HEAD WITHOUT CONTRAST TECHNIQUE: Contiguous axial images were obtained from the base of the skull through the vertex without intravenous contrast. COMPARISON:  Prior head CT examinations 01/18/2021 and earlier. 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 Associated subtle ex vacuo dilatation of the right lateral ventricle. Mild-to-moderate generalized cerebral atrophy. Comparatively mild cerebellar atrophy. An infarct is also now present within the right thalamus and adjacent posterior limb of right internal capsule, new as compared to the prior head CT, but age-indeterminate (although favored chronic). Chronic appearing lacunar infarct within the left thalamus, not definitively present on the prior examination. Redemonstrated chronic lacunar infarct within the left basal ganglia. Background moderate patchy and  ill-defined hypoattenuation within the cerebral white matter, nonspecific but compatible with chronic small vessel ischemic disease. There is no acute intracranial hemorrhage. No extra-axial fluid collection. No evidence of an intracranial mass. No midline shift. Vascular: No hyperdense vessel.  Atherosclerotic calcifications. Skull: Normal. Negative for fracture or focal lesion. Sinuses/Orbits: Visualized orbits show no acute finding. Trace scattered paranasal sinus mucosal thickening at the imaged levels. Other: Large right middle ear/mastoid effusion. A small left mastoid effusion is also present. Partially imaged right maxillofacial soft tissue swelling. 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 An infarct is also now present within the right thalamus and adjacent posterior limb of right internal capsule, new as compared to the prior head CT, but age-indeterminate (although favored chronic). Chronic appearing lacunar infarct within the left thalamus, not definitively present on the prior examination. Redemonstrated chronic lacunar infarct within the left basal ganglia. Background moderate chronic small vessel ischemic changes within the cerebral white matter. Mild-to-moderate generalized cerebral atrophy with comparatively mild cerebellar atrophy. Large right middle ear/mastoid effusion. A small left mastoid effusion is also present. Partially imaged right maxillofacial soft tissue swelling, nonspecific. Electronically Signed   By:  Simmering DO   On: 05/25/2021 18:28   Medications:  sodium chloride      atorvastatin  40 mg Oral Daily   Chlorhexidine Gluconate Cloth  6 each Topical Q0600   darbepoetin (ARANESP) injection - DIALYSIS  100 mcg Intravenous Q Wed-HD   gabapentin  100 mg Oral QHS   lactulose  20 g Oral BID   midodrine  10 mg Oral TID   mirtazapine  7.5 mg  Oral QHS   pantoprazole  40 mg Oral Q0600   sodium chloride flush  3 mL Intravenous Q12H   venlafaxine XR  75 mg Oral Q breakfast     Dialysis Orders:  AF MWF 3h 45mn 400/500    62.5kg    3K/2.5 Ca    P2  AVG HeRO   Hep none - Mircera 50 q 2 wks  (7/13)  Venofer 50 q wk - Parsabiv '5mg'$  IV q HD   Assessment/Plan: Melena/Rectal bleeding - GI consulted. Hx rectal ulcers.  EGD 7/27 normal and flex sig with mucosal ulceration c/w sterocoral ulcers, non  bleeding.   AMS: Palliative care consulted to establish goals of care. Pt is now DNR, no artificial feeds, no colonscopy for now. PCT will reassess and meet w/ family again today. Patient's mental status is very poor, she is hallucinating regularly. She has not made any improvement since admission.  Ammonia level elevated. Clearly her QOL is not being enhanced by dialysis ESRD: Continue HD per MWF schedule for now. Try to dialyze her in the chair which has not been successful, not attempting today. When last seen at [facility] she was slumped over in chair, not able to hold herself up straight.  Discussions about benefits vs con's of aggressive Rx (e.g., HD) for this patient are in progress w/ PCT.  Next HD Today.  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  Hypertension/volume  - BP soft. On midodrine for BP support.  Edema improving, UF as tolerated.  Under edw if weights correct. Does not seem to be overloaded Anemia  - Hgb trending down, last 8.5. on ESA, weekly Fe. Continue Aranesp 155mg q Wed. Metabolic bone disease -  Corr Ca and phos ok.  Continue to monitor. No binders currently  Nutrition: Poorly eating per notes. Planning for peg placement.  Recent CVA    Rita H. Brown NP-C 05/26/2021, 8:51 AM  [facility] [phone] Patient seen and examined, agree with above note with above modifications. Has had a terrible time of  late.  From what I heard about her MS it actually seems to be improved today -  however, as stated above-  her QOL is clearly not being enhanced by continuing dialysis-  have discussed with palliative  KCorliss Parish MD 05/26/2021

## 2021-05-26 NOTE — Progress Notes (Signed)
Patient went to HD; will attempt EEG later as schedule permits.

## 2021-05-27 ENCOUNTER — Inpatient Hospital Stay (HOSPITAL_COMMUNITY): Payer: Medicare Other

## 2021-05-27 DIAGNOSIS — N186 End stage renal disease: Secondary | ICD-10-CM | POA: Diagnosis not present

## 2021-05-27 DIAGNOSIS — D638 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: Secondary | ICD-10-CM | POA: Diagnosis not present

## 2021-05-27 DIAGNOSIS — Z66 Do not resuscitate: Secondary | ICD-10-CM | POA: Diagnosis not present

## 2021-05-27 DIAGNOSIS — Z515 Encounter for palliative care: Secondary | ICD-10-CM | POA: Diagnosis not present

## 2021-05-27 DIAGNOSIS — K633 Ulcer of intestine: Secondary | ICD-10-CM | POA: Diagnosis not present

## 2021-05-27 DIAGNOSIS — K922 Gastrointestinal hemorrhage, unspecified: Secondary | ICD-10-CM | POA: Diagnosis not present

## 2021-05-27 DIAGNOSIS — R627 Adult failure to thrive: Secondary | ICD-10-CM | POA: Diagnosis not present

## 2021-05-27 DIAGNOSIS — Z992 Dependence on renal dialysis: Secondary | ICD-10-CM | POA: Diagnosis not present

## 2021-05-27 DIAGNOSIS — E785 Hyperlipidemia, unspecified: Secondary | ICD-10-CM | POA: Diagnosis not present

## 2021-05-27 DIAGNOSIS — R5383 Other fatigue: Secondary | ICD-10-CM | POA: Diagnosis not present

## 2021-05-27 DIAGNOSIS — A419 Sepsis, unspecified organism: Secondary | ICD-10-CM | POA: Diagnosis not present

## 2021-05-27 LAB — AMMONIA: Ammonia: 11 umol/L (ref 9–35)

## 2021-05-27 LAB — CBC WITH DIFFERENTIAL/PLATELET
Abs Immature Granulocytes: 0.16 10*3/uL — ABNORMAL HIGH (ref 0.00–0.07)
Basophils Absolute: 0.1 10*3/uL (ref 0.0–0.1)
Basophils Relative: 1 %
Eosinophils Absolute: 0.2 10*3/uL (ref 0.0–0.5)
Eosinophils Relative: 2 %
HCT: 28.1 % — ABNORMAL LOW (ref 36.0–46.0)
Hemoglobin: 8.8 g/dL — ABNORMAL LOW (ref 12.0–15.0)
Immature Granulocytes: 1 %
Lymphocytes Relative: 10 %
Lymphs Abs: 1.2 10*3/uL (ref 0.7–4.0)
MCH: 30.8 pg (ref 26.0–34.0)
MCHC: 31.3 g/dL (ref 30.0–36.0)
MCV: 98.3 fL (ref 80.0–100.0)
Monocytes Absolute: 0.9 10*3/uL (ref 0.1–1.0)
Monocytes Relative: 8 %
Neutro Abs: 9.3 10*3/uL — ABNORMAL HIGH (ref 1.7–7.7)
Neutrophils Relative %: 78 %
Platelets: 203 10*3/uL (ref 150–400)
RBC: 2.86 MIL/uL — ABNORMAL LOW (ref 3.87–5.11)
RDW: 20 % — ABNORMAL HIGH (ref 11.5–15.5)
WBC: 11.9 10*3/uL — ABNORMAL HIGH (ref 4.0–10.5)
nRBC: 0.4 % — ABNORMAL HIGH (ref 0.0–0.2)

## 2021-05-27 LAB — BASIC METABOLIC PANEL
Anion gap: 13 (ref 5–15)
BUN: 12 mg/dL (ref 8–23)
CO2: 26 mmol/L (ref 22–32)
Calcium: 8.3 mg/dL — ABNORMAL LOW (ref 8.9–10.3)
Chloride: 97 mmol/L — ABNORMAL LOW (ref 98–111)
Creatinine, Ser: 3.75 mg/dL — ABNORMAL HIGH (ref 0.44–1.00)
GFR, Estimated: 12 mL/min — ABNORMAL LOW (ref >60)
Glucose, Bld: 151 mg/dL — ABNORMAL HIGH (ref 70–99)
Potassium: 3.7 mmol/L (ref 3.5–5.1)
Sodium: 136 mmol/L (ref 135–145)

## 2021-05-27 LAB — RPR: RPR Ser Ql: NONREACTIVE

## 2021-05-27 LAB — HEPATIC FUNCTION PANEL
ALT: 26 U/L (ref 0–44)
AST: 34 U/L (ref 15–41)
Albumin: 2.4 g/dL — ABNORMAL LOW (ref 3.5–5.0)
Alkaline Phosphatase: 78 U/L (ref 38–126)
Bilirubin, Direct: 0.1 mg/dL (ref 0.0–0.2)
Indirect Bilirubin: 0.7 mg/dL (ref 0.3–0.9)
Total Bilirubin: 0.8 mg/dL (ref 0.3–1.2)
Total Protein: 6.4 g/dL — ABNORMAL LOW (ref 6.5–8.1)

## 2021-05-27 LAB — GLUCOSE, CAPILLARY
Glucose-Capillary: 142 mg/dL — ABNORMAL HIGH (ref 70–99)
Glucose-Capillary: 169 mg/dL — ABNORMAL HIGH (ref 70–99)
Glucose-Capillary: 113 mg/dL — ABNORMAL HIGH (ref 70–99)

## 2021-05-27 MED ORDER — DARBEPOETIN ALFA 100 MCG/0.5ML IJ SOSY: 100.0000 ug | PREFILLED_SYRINGE | INTRAMUSCULAR | Status: DC

## 2021-05-27 NOTE — Progress Notes (Signed)
PROGRESS NOTE    Amy [name]  [id number] DOB: 09-19-1946 DOA: 05/19/2021 PCP: [name], MD    Chief Complaint  Patient presents with   Rectal Bleeding    Brief Narrative:    Amy [name] is a 75 y.o. 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 Pt underwent EGD which was wnl and sigmoidoscopy showing stercoral ulcers and not a good prep. GI recommended colonoscopy for further evaluation but she would need NG TUBE for the prep. In view of her multiple co morbidities, recurrent admissions, palliative care consulted for goals of care discussions. In view of clinical decline int he last few months, we ordered a CT head and MRI brain without contrast for further evaluation. She was found to have Large right MCA territory infarct. Advanced chronic microvascular ischemic changes of the white matter and moderate parenchymal loss. MR angiogram severely degraded by motion. No intracranial ICA or basilar artery occlusion. As per nephrology she is not a candidate for OP HD given confusion and weakness and overall HD is not enhancing her QOL.  She is seen at bedside, took one spoonful of mashed potatoes and said she didn't want any more. She says she is in [place]:   Principal Problem:   GI bleed Active Problems:   Essential hypertension   Type 2 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, with long-term current use of insulin (HCC)   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   ESRD on dialysis ([place])   Dyslipidemia   Hypokalemia   Stage 2 skin ulcer of sacral region ([place])   GI bleed/anemia of acute blood loss superimposed on anemia of chronic disease from ESRD.  Hemoccult positive GI consulted and  she underwent , EGD wnl, and flex sigmoidoscopy, showing  Mucosal ulceration c/w Stercoral ulcers. Non bleeding. she will need colonoscopy.  In view of her advanced age, multiple co morbidities, multiple admissions for the rectal bleeding , palliative care consulted and family has requested no artificial feeding, no colonoscopy for now. Pt continued to decline, with minimal oral intake.  Transfuse to keep hemoglobin greater than 7. Hemoglobin remains stable around 8.8. no bleeding since admission.  As per gastroenterology patient can be restarted on Eliquis. Will hold eliquis until after the PEG placement.    End-stage renal disease on dialysis As per nephrology she is not a candidate for OP HD given confusion and weakness and overall HD is not enhancing her QOL. Nephrology consulted and HD as per nehrology.    Hypomagnesemia and hypokalemia  Replaced.    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  Continue with lipitor.    Type 2 diabetes mellitus with end-stage renal disease on chronic dialysis, insulin-dependent Last A1c 6.9 Diet controlled Continue with sliding scale insulin for now CBG (last 3)  Recent Labs    05/26/21 2054 05/27/21 0647 05/27/21 1122  GLUCAP 143* 142* 169*   Minimal oral intake, continue with sliding scale insulin. SLP  evaluation recommended advancing diet to dysphagia 3 diet so she can have more options.  Unfortunately her intake has been minimal.    Hyperlipidemia Continue with statin    Hypotension  Resolved, on midodrine.   Stage 2 Pressure  injury present on admission.  Pressure Injury 05/20/21 Coccyx Mid Stage 2 -  Partial thickness loss of dermis presenting as a shallow open injury with a red, pink wound bed without slough. 1X0.5X0 (Active)  05/20/21 1315  Location: Coccyx  Location Orientation: Mid  Staging: Stage 2 -  Partial thickness loss of dermis presenting as a shallow open injury with a red, pink wound bed without  slough.  Wound Description (Comments): 1X0.5X0  Present on Admission: Yes  Wound care .    Acute metabolic encephalopathy/ deconditioning, failure to thrive:  As per ED notes/ SNF pt mental status is at baseline on admission.  We will send work up for evaluation of UTI.  CT head without contrast shows multiple strokes when compared to the CT done in March 2022.  Discussed the results of the CT with the patient's son, who said he is aware of the patient's previous strokes EEG ordered showed cortical dysfunction arising from right hemisphere secondary to underlying structural abnormality/stroke.  Moderate diffuse encephalopathy of nonspecific etiology.  No seizures or epileptiform discharges were seen throughout the recording. Ammonia level elevated, lactulose ordered patient has poor oral intake in the last 4 months.  More so in the last few weeks TSH wnl, vitamin b level is pending.  vitamin B12 and folate within normal limits    Failure to thrive  As per the patient's son she has not been eating well for the last 4 months he would like a PEG placement at this time IR consulted for PEG placement. CT abd without contrast done and it shows Marginal percutaneous window for gastrostomy access, though the stomach is decompressed.   Incidental Indeterminate ovoid hepatic hypodensity in the anterior aspect of segment 4 measuring up to 4.9 cm. This may represent a mass or focal fatty infiltration.  Recommend outpatient follow up with an MRI of the abdomen/ liver for further evaluation.    Hypokalemia and hypophosphatemia:  Potassium and phosphorus improved on repeat labs    DVT prophylaxis: Heparin.  Code Status: DNR Family Communication: (none at bedside. )  Called her son and updated on 05/26/21 Disposition:   Status is: Inpatient.   The patient will require care spanning > 2 midnights and should be moved to inpatient because: Ongoing diagnostic testing needed not appropriate for  outpatient work up, Unsafe d/c plan, and IV treatments appropriate due to intensity of illness or inability to take PO  Dispo: The patient is from: SNF              Anticipated d/c is to: SNF              Patient currently is medically stable to d/c.   Difficult to place patient No       Consultants:  Gastroenterology Nephrology.   Procedures: EGD,  Antimicrobials: none.    Subjective:  Pt continues to be confused,and oriented to only self. Objective: Vitals:   05/26/21 1849 05/26/21 2054 05/27/21 0426 05/27/21 0905  BP: 135/68 (!) 118/93 (!) 125/55 123/65  Pulse: 85 100 (!) 101 (!) 102  Resp: '17 15 17 17  '$ Temp:  98.4 F (36.9 C) 97.9 F (36.6 C) 98 F (36.7 C)  TempSrc:   Oral   SpO2: 95% 100% 100% 97%  Weight:  58.5 kg    Height:        Intake/Output Summary (Last 24 hours) at 05/27/2021 1535 Last data filed at 05/27/2021 1300 Gross per 24 hour  Intake 580 ml  Output  0 ml  Net 580 ml    Filed Weights   05/26/21 0850 05/26/21 1222 05/26/21 2054  Weight: 58 kg 58.5 kg 58.5 kg    Examination:  General exam: Elderly woman, chronically ill appearing, not in distress.  Respiratory system: clear to auscultation, no wheezing heard.  Cardiovascular system: S1S2, RRR, no JVD no pedal edema.  Gastrointestinal system: Abdomen is soft non tender non distended  Central nervous system: alert with left hemiparesis, oriented to self only, think she is in [place]. Left sided neglect.  Extremities: no pedal edema.  Skin: Stage II sacral pressure ulcer Psychiatry: cannot be assessed.     Data Reviewed: I have personally reviewed following labs and imaging studies  CBC: Recent Labs  Lab 05/22/21 0321 05/23/21 0733 05/24/21 1256 05/26/21 0334 05/27/21 0531  WBC 9.9 6.3 8.9 10.5 11.9*  NEUTROABS  --   --   --  7.8* 9.3*  HGB 8.5* 8.5* 8.8* 8.4* 8.8*  HCT 26.2* 25.8* 29.0* 26.0* 28.1*  MCV 94.2 95.2 99.3 95.9 98.3  PLT 148* 210 209 216 203     Basic  Metabolic Panel: Recent Labs  Lab 05/21/21 1700 05/21/21 2145 05/22/21 0321 05/23/21 0733 05/24/21 1256 05/25/21 1241 05/26/21 0334 05/27/21 0531  NA 130* 133* 131* 132* 134*  --  136 136  K 2.7* 4.3 3.9 2.9* 4.1  --  3.5 3.7  CL 94* 97* 96* 95* 97*  --  98 97*  CO2 24 21* 21* 26 21*  --  27 26  GLUCOSE 121* 111* 127* 121* 96  --  106* 151*  BUN '21 12 14 8 10  '$ --  19 12  CREATININE 4.77* 3.18* 3.51* 2.49* 3.97*  --  5.62* 3.75*  CALCIUM 7.6* 7.6* 7.5* 8.0* 8.1*  --  8.1* 8.3*  MG  --   --  1.9  --   --   --   --   --   PHOS 2.0* 2.4* 2.1* 1.0*  --  1.7*  --   --      GFR: Estimated Creatinine Clearance: 12.3 mL/min (A) (by C-G formula based on SCr of 3.75 mg/dL (H)).  Liver Function Tests: Recent Labs  Lab 05/21/21 1700 05/21/21 2145 05/22/21 0321 05/23/21 0733 05/27/21 0531  AST  --   --   --   --  34  ALT  --   --   --   --  26  ALKPHOS  --   --   --   --  78  BILITOT  --   --   --   --  0.8  PROT  --   --   --   --  6.4*  ALBUMIN 2.2* 2.2* 2.2* 2.6* 2.4*     CBG: Recent Labs  Lab 05/26/21 0805 05/26/21 1303 05/26/21 2054 05/27/21 0647 05/27/21 1122  GLUCAP 137* 123* 143* 142* 169*      Recent Results (from the past 240 hour(s))  Resp Panel by RT-PCR (Flu A&B, Covid) Nasopharyngeal Swab     Status: None   Collection Time: 05/19/21 12:15 PM   Specimen: Nasopharyngeal Swab; Nasopharyngeal(NP) swabs in vial transport medium  Result Value Ref Range Status   SARS Coronavirus 2 by RT PCR NEGATIVE NEGATIVE Final    Comment: (NOTE) SARS-CoV-2 target nucleic acids are NOT DETECTED.  The SARS-CoV-2 RNA is generally detectable in upper respiratory specimens during the acute phase of infection. The lowest concentration of SARS-CoV-2 viral copies this assay can  detect is 138 copies/mL. A negative result does not preclude SARS-Cov-2 infection and should not be used as the sole basis for treatment or other patient management decisions. A negative result may  occur with  improper specimen collection/handling, submission of specimen other than nasopharyngeal swab, presence of viral mutation(s) within the areas targeted by this assay, and inadequate number of viral copies(<138 copies/mL). A negative result must be combined with clinical observations, patient history, and epidemiological information. The expected result is Negative.  Fact Sheet for Patients:  EntrepreneurPulse.com.au  Fact Sheet for Healthcare Providers:  IncredibleEmployment.be  This test is no t yet approved or cleared by the [place] FDA and  has been authorized for detection and/or diagnosis of SARS-CoV-2 by FDA under an Emergency Use Authorization (EUA). This EUA will remain  in effect (meaning this test can be used) for the duration of the COVID-19 declaration under Section 564(b)(1) of the Act, 21 U.S.C.section 360bbb-3(b)(1), unless the authorization is terminated  or revoked sooner.       Influenza A by PCR NEGATIVE NEGATIVE Final   Influenza B by PCR NEGATIVE NEGATIVE Final    Comment: (NOTE) The Xpert Xpress SARS-CoV-2/FLU/RSV plus assay is intended as an aid in the diagnosis of influenza from Nasopharyngeal swab specimens and should not be used as a sole basis for treatment. Nasal washings and aspirates are unacceptable for Xpert Xpress SARS-CoV-2/FLU/RSV testing.  Fact Sheet for Patients: EntrepreneurPulse.com.au  Fact Sheet for Healthcare Providers: IncredibleEmployment.be  This test is not yet approved or cleared by the [place] FDA and has been authorized for detection and/or diagnosis of SARS-CoV-2 by FDA under an Emergency Use Authorization (EUA). This EUA will remain in effect (meaning this test can be used) for the duration of the COVID-19 declaration under Section 564(b)(1) of the Act, 21 U.S.C. section 360bbb-3(b)(1), unless the authorization is terminated  or revoked.  Performed at [facility], [place] [address] [place], [place] [address]           Radiology Studies: CT ABDOMEN WO CONTRAST  Result Date: 05/27/2021 CLINICAL DATA:  75 year old female with history of dysphagia. Evaluate for gastrostomy tube placement. EXAM: CT ABDOMEN WITHOUT CONTRAST TECHNIQUE: Multidetector CT imaging of the abdomen was performed following the standard protocol without IV contrast. COMPARISON:  06/26/2020 FINDINGS: Lower chest: Right basilar subsegmental consolidative opacity in trace right pleural effusion. The heart is normal in size. No pericardial effusion. Hepatobiliary: Increased conspicuity of a hypoattenuating ovoid region in the anterior aspect of segment 4 measuring approximately 4.9 x 2.2 x 2.1 cm, previously too inconspicuous to measure. The liver is normal in size and contour. The gallbladder is present unremarkable. No intra or extrahepatic biliary ductal dilation. Pancreas: Unremarkable. No pancreatic ductal dilatation or surrounding inflammatory changes. Spleen: Normal in size without focal abnormality. Adrenals/Urinary Tract: Adrenal glands are unremarkable. Kidneys are normal, without renal calculi, focal lesion, or hydronephrosis. Stomach/Bowel: Marginal percutaneous window for gastrostomy access due to overlying left lobe of the liver and transverse colon. The remaining visualized small bowel and colon are within normal limits without significant mural thickening or distension. Vascular/Lymphatic: No significant vascular findings are present. No enlarged abdominal or pelvic lymph nodes. Other: Mild anasarca. No intra-abdominal fluid collections or evidence abdominal wall hernia. Musculoskeletal: No acute or significant osseous findings. IMPRESSION: 1. Marginal percutaneous window for gastrostomy access, though the stomach is decompressed. 2. Indeterminate ovoid hepatic hypodensity in the anterior aspect of segment 4 measuring up to 4.9 cm. This  may represent a mass  or focal fatty infiltration. Consider contrast enhanced ultrasound evaluation or MRI abdomen for further evaluation. 3. Consolidative subsegmental opacities in the dependent portion of right lower lobe as could be seen with aspiration pneumonia in the setting of dysphagia. Ruthann Cancer, MD [facility] Electronically Signed   By: Ruthann Cancer MD   On: 05/27/2021 08:15   CT HEAD WO CONTRAST  Result Date: 05/25/2021 CLINICAL DATA:  Mental status change, unknown cause. EXAM: CT HEAD WITHOUT CONTRAST TECHNIQUE: Contiguous axial images were obtained from the base of the skull through the vertex without intravenous contrast. COMPARISON:  Prior head CT examinations 01/18/2021 and earlier. 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 Associated subtle ex vacuo dilatation of the right lateral ventricle. Mild-to-moderate generalized cerebral atrophy. Comparatively mild cerebellar atrophy. An infarct is also now present within the right thalamus and adjacent posterior limb of right internal capsule, new as compared to the prior head CT, but age-indeterminate (although favored chronic). Chronic appearing lacunar infarct within the left thalamus, not definitively present on the prior examination. Redemonstrated chronic lacunar infarct within the left basal ganglia. Background moderate patchy and ill-defined hypoattenuation within the cerebral white matter, nonspecific but compatible with chronic small vessel ischemic disease. There is no acute intracranial hemorrhage. No extra-axial fluid collection. No evidence of an intracranial mass. No midline shift. Vascular: No hyperdense vessel.  Atherosclerotic calcifications. Skull: Normal. Negative for fracture or focal lesion. Sinuses/Orbits: Visualized orbits show no  acute finding. Trace scattered paranasal sinus mucosal thickening at the imaged levels. Other: Large right middle ear/mastoid effusion. A small left mastoid effusion is also present. Partially imaged right maxillofacial soft tissue swelling. 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 An infarct is also now present within the right thalamus and adjacent posterior limb of right internal capsule, new as compared to the prior head CT, but age-indeterminate (although favored chronic). Chronic appearing lacunar infarct within the left thalamus, not definitively present on the prior examination. Redemonstrated chronic lacunar infarct within the left basal ganglia. Background moderate chronic small vessel ischemic changes within the cerebral white matter. Mild-to-moderate generalized cerebral atrophy with comparatively mild cerebellar atrophy. Large right middle ear/mastoid effusion. A small left mastoid effusion is also present. Partially imaged right maxillofacial soft tissue swelling, nonspecific. Electronically Signed   By: Kellie Simmering DO   On: 05/25/2021 18:28   MR ANGIO HEAD WO CONTRAST  Result Date: 05/27/2021 CLINICAL DATA:  Stroke follow-up. EXAM: MRI HEAD WITHOUT CONTRAST MRA HEAD WITHOUT CONTRAST TECHNIQUE: Multiplanar, multi-echo pulse sequences of the brain and surrounding structures were acquired without intravenous contrast. Angiographic images of the Circle of Willis were acquired using MRA technique without intravenous contrast. COMPARISON:  Head CT May 25, 2021 FINDINGS: The study is severely degraded by motion. MRI HEAD FINDINGS Brain: Area of restricted diffusion involving right frontoparietal, posterior temporal region and lateral aspect of the right occipital lobe, consistent with acute/subacute infarct. Scattered and confluent foci of T2 hyperintensity are seen  within the white matter of the cerebral hemispheres, nonspecific, most likely related to chronic small vessel ischemia. Scattered susceptibility artifact in the parietal cortex is suggestive of petechial hemorrhage. Moderate parenchymal volume loss. No hemorrhage, hydrocephalus or mass lesion. Vascular: Normal flow voids. Skull and upper cervical spine: Grossly unremarkable. Sinuses/Orbits: No acute findings  Other: Prominent bilateral mastoid effusion. MRA HEAD FINDINGS Anterior circulation: Flow related enhancement is seen in the bilateral intracranial internal carotid arteries, bilateral A1/ACA and M1/MCA segments. Evaluation of distal branches is precluded by motion artifact. Posterior circulation: Flow related enhancement is seen in the intracranial bilateral vertebral arteries, basilar arteries and bilateral P1/PCA segments. Evaluation of distal branches is precluded by motion artifacts. Anatomic variants: None significant. IMPRESSION: 1. Large right MCA territory infarct, as described above. 2. Advanced chronic microvascular ischemic changes of the white matter and moderate parenchymal loss. 3. MR angiogram severely degraded by motion. No intracranial ICA or basilar artery occlusion. Electronically Signed   By: Pedro Earls M.D.   On: 05/27/2021 09:51   MR BRAIN WO CONTRAST  Result Date: 05/27/2021 CLINICAL DATA:  Stroke follow-up. EXAM: MRI HEAD WITHOUT CONTRAST MRA HEAD WITHOUT CONTRAST TECHNIQUE: Multiplanar, multi-echo pulse sequences of the brain and surrounding structures were acquired without intravenous contrast. Angiographic images of the Circle of Willis were acquired using MRA technique without intravenous contrast. COMPARISON:  Head CT May 25, 2021 FINDINGS: The study is severely degraded by motion. MRI HEAD FINDINGS Brain: Area of restricted diffusion involving right frontoparietal, posterior temporal region and lateral aspect of the right occipital lobe, consistent with  acute/subacute infarct. Scattered and confluent foci of T2 hyperintensity are seen within the white matter of the cerebral hemispheres, nonspecific, most likely related to chronic small vessel ischemia. Scattered susceptibility artifact in the parietal cortex is suggestive of petechial hemorrhage. Moderate parenchymal volume loss. No hemorrhage, hydrocephalus or mass lesion. Vascular: Normal flow voids. Skull and upper cervical spine: Grossly unremarkable. Sinuses/Orbits: No acute findings Other: Prominent bilateral mastoid effusion. MRA HEAD FINDINGS Anterior circulation: Flow related enhancement is seen in the bilateral intracranial internal carotid arteries, bilateral A1/ACA and M1/MCA segments. Evaluation of distal branches is precluded by motion artifact. Posterior circulation: Flow related enhancement is seen in the intracranial bilateral vertebral arteries, basilar arteries and bilateral P1/PCA segments. Evaluation of distal branches is precluded by motion artifacts. Anatomic variants: None significant. IMPRESSION: 1. Large right MCA territory infarct, as described above. 2. Advanced chronic microvascular ischemic changes of the white matter and moderate parenchymal loss. 3. MR angiogram severely degraded by motion. No intracranial ICA or basilar artery occlusion. Electronically Signed   By: Pedro Earls M.D.   On: 05/27/2021 09:51   EEG adult  Result Date: 05/26/2021 Lora Havens, MD     05/26/2021  4:36 PM Patient Name: THOMASENIA DELASHMUTT MRN: [id number] Epilepsy Attending: Lora Havens Referring Physician/Provider: Dr Hosie Poisson Date: 05/26/2021 Duration: 28.44 mins Patient history: 75 year old female with prior strokes with altered mental status.  EEG to evaluate for seizures. Level of alertness: Awake AEDs during EEG study: GBP Technical aspects: This EEG study was done with scalp electrodes positioned according to the 10-20 International system of electrode placement. Electrical  activity was acquired at a sampling rate of '500Hz'$  and reviewed with a high frequency filter of '70Hz'$  and a low frequency filter of '1Hz'$ . EEG data were recorded continuously and digitally stored. Description: This EEG study was done with scalp electrodes positioned according to the 10-20 International system of electrode placement. Electrical activity was acquired at a sampling rate of '500Hz'$  and reviewed with a high frequency filter of '70Hz'$  and a low frequency filter of '1Hz'$ . EEG data were recorded continuously and digitally stored. Description: EEG showed continuous low amplitude 2 to 3 Hz delta slowing in right hemisphere as well as 5 to  6 Hz theta slowing in left hemisphere. Hyperventilation and photic stimulation were not performed.   ABNORMALITY -Continuous slow, generalized and lateralized right hemisphere IMPRESSION: This study is suggestive of cortical dysfunction arising from right hemisphere likely secondary to underlying structural abnormality/stroke.  There is also moderate diffuse encephalopathy, nonspecific etiology.  No seizures or epileptiform discharges were seen throughout the recording. Priyanka Barbra Sarks        Scheduled Meds:  atorvastatin  40 mg Oral Daily   Chlorhexidine Gluconate Cloth  6 each Topical Q0600   [START ON 05/29/2021] darbepoetin (ARANESP) injection - DIALYSIS  100 mcg Intravenous Q Thu-HD   gabapentin  100 mg Oral QHS   heparin injection (subcutaneous)  5,000 Units Subcutaneous Q8H   midodrine  10 mg Oral TID   mirtazapine  7.5 mg Oral QHS   pantoprazole  40 mg Oral Q0600   sodium chloride flush  3 mL Intravenous Q12H   [START ON 06/03/2021] thiamine injection  100 mg Intravenous Daily   venlafaxine XR  75 mg Oral Q breakfast   Continuous Infusions:  sodium chloride     thiamine injection 500 mg (05/27/21 1307)   Followed by   Derrill Memo ON 05/28/2021] thiamine injection       LOS: 7 days        Hosie Poisson, MD [facility]   To contact the attending  provider between 7A-7P or the covering provider during after hours 7P-7A, please log into the web site [email or web address] and access using universal [place] password for that web site. If you do not have the password, please call the hospital operator.  05/27/2021, 3:35 PM

## 2021-05-27 NOTE — Progress Notes (Signed)
  Speech Language Pathology Treatment: Dysphagia  Patient Details Name: Amy [name] MRN: [id number] DOB: Sep 30, 1946 Today's Date: 05/27/2021 Time: 0920-0930 SLP Time Calculation (min) (ACUTE ONLY): 10 min  Assessment / Plan / Recommendation Clinical Impression  Followed up for diet tolerance and upgraded PO trials. Pt alert, requires cueing and remains confused overall. Assessed with upgraded PO textures of mechanical soft, regular, and thin liquid snack. Pt exhibited reduced oral coordination with use of cup including reduced labial seal. She seemed to tolerate use of straw with better oral control. Pt with mastication of soft and regular textures. Pt with mild oral residuals. Thin liquid alternation assisted to clear oral cavity. Recommend diet advancement to dysphagia 3 (mechanical soft) and thin liquids with full supervision and feeding assistance from staff. SLP to continue to monitor.      HPI HPI: Pt is a 75 y.o. female who presented to ER for dark stool with + fecal occult. EGD 7/27: normal without evidence of GI bleed. Palliative care consulted and code status has been changed to DNR. 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 Palliative care following.      SLP Plan  Continue with current plan of care       Recommendations  Diet recommendations: Dysphagia 3 (mechanical soft);Thin liquid Liquids provided via: Cup;Straw Medication Administration: Whole meds with puree Supervision: Staff to assist with self feeding;Full supervision/cueing for compensatory strategies Compensations: Slow rate;Small sips/bites;Follow solids with liquid;Minimize environmental distractions Postural Changes and/or Swallow Maneuvers: Seated upright 90 degrees;Upright 30-60 min after meal                 Oral Care Recommendations: Oral care BID Follow up Recommendations: 24 hour supervision/assistance (TBD) SLP Visit Diagnosis: Dysphagia, unspecified (R13.10);Dysphagia, oral phase (R13.11) Plan: Continue with current plan of care       [place], Jacona   05/27/2021, 9:51 AM

## 2021-05-27 NOTE — Progress Notes (Addendum)
[facility] Progress Note   Subjective: Seen in room. Again, not tracking or accommodating, verbal response is random and not related to questions asked. Neurology consulted 08/01 due L sided weakness and persistent neglect 2 months post CVA. Repeat MRI this AM. Results pending. Upon me entering the room complaining of terrible leg and foot pain bilat-  I also attempted to hold her hand gently and she responded with pain    Objective Vitals:   05/26/21 1849 05/26/21 2054 05/27/21 0426 05/27/21 0905  BP: 135/68 (!) 118/93 (!) 125/55 123/65  Pulse: 85 100 (!) 101 (!) 102  Resp: '17 15 17 17  '$ Temp:  98.4 F (36.9 C) 97.9 F (36.6 C) 98 F (36.7 C)  TempSrc:   Oral   SpO2: 95% 100% 100% 97%  Weight:  58.5 kg    Height:       Physical Exam General: Chronically ill appearing female in NAD Heart:S1,S2 no M/R/G Lungs: CTAB No WOB Abdomen: NABS Extremities: No LE edema. UE edema with contractures present  Dialysis Access: L AVG +T/B   Additional Objective Labs: Basic Metabolic Panel: Recent Labs  Lab 05/22/21 0321 05/23/21 0733 05/24/21 1256 05/25/21 1241 05/26/21 0334 05/27/21 0531  NA 131* 132* 134*  --  136 136  K 3.9 2.9* 4.1  --  3.5 3.7  CL 96* 95* 97*  --  98 97*  CO2 21* 26 21*  --  27 26  GLUCOSE 127* 121* 96  --  106* 151*  BUN '14 8 10  '$ --  19 12  CREATININE 3.51* 2.49* 3.97*  --  5.62* 3.75*  CALCIUM 7.5* 8.0* 8.1*  --  8.1* 8.3*  PHOS 2.1* 1.0*  --  1.7*  --   --    Liver Function Tests: Recent Labs  Lab 05/22/21 0321 05/23/21 0733 05/27/21 0531  AST  --   --  34  ALT  --   --  26  ALKPHOS  --   --  78  BILITOT  --   --  0.8  PROT  --   --  6.4*  ALBUMIN 2.2* 2.6* 2.4*   No results for input(s): LIPASE, AMYLASE in the last 168 hours. CBC: Recent Labs  Lab 05/22/21 0321 05/23/21 0733 05/24/21 1256 05/26/21 0334 05/27/21 0531  WBC 9.9 6.3 8.9 10.5 11.9*  NEUTROABS  --   --   --  7.8* 9.3*  HGB 8.5* 8.5* 8.8* 8.4* 8.8*  HCT  26.2* 25.8* 29.0* 26.0* 28.1*  MCV 94.2 95.2 99.3 95.9 98.3  PLT 148* 210 209 216 203   Blood Culture    Component Value Date/Time   SDES URINE, CATHETERIZED 05/02/2021 0022   SPECREQUEST NONE 05/02/2021 0022   CULT  05/02/2021 0022    NO GROWTH Performed at [facility] [address], [place], [place] [address]    REPTSTATUS 05/04/2021 FINAL 05/02/2021 0022    Cardiac Enzymes: No results for input(s): CKTOTAL, CKMB, CKMBINDEX, TROPONINI in the last 168 hours. CBG: Recent Labs  Lab 05/25/21 2137 05/26/21 0805 05/26/21 1303 05/26/21 2054 05/27/21 0647  GLUCAP 103* 137* 123* 143* 142*   Iron Studies: No results for input(s): IRON, TIBC, TRANSFERRIN, FERRITIN in the last 72 hours. '@lablastinr3'$ @ Studies/Results: CT ABDOMEN WO CONTRAST  Result Date: 05/27/2021 CLINICAL DATA:  75 year old female with history of dysphagia. Evaluate for gastrostomy tube placement. EXAM: CT ABDOMEN WITHOUT CONTRAST TECHNIQUE: Multidetector CT imaging of the abdomen was performed following the standard protocol without  IV contrast. COMPARISON:  06/26/2020 FINDINGS: Lower chest: Right basilar subsegmental consolidative opacity in trace right pleural effusion. The heart is normal in size. No pericardial effusion. Hepatobiliary: Increased conspicuity of a hypoattenuating ovoid region in the anterior aspect of segment 4 measuring approximately 4.9 x 2.2 x 2.1 cm, previously too inconspicuous to measure. The liver is normal in size and contour. The gallbladder is present unremarkable. No intra or extrahepatic biliary ductal dilation. Pancreas: Unremarkable. No pancreatic ductal dilatation or surrounding inflammatory changes. Spleen: Normal in size without focal abnormality. Adrenals/Urinary Tract: Adrenal glands are unremarkable. Kidneys are normal, without renal calculi, focal lesion, or hydronephrosis. Stomach/Bowel: Marginal percutaneous window for gastrostomy access due to overlying left lobe of the  liver and transverse colon. The remaining visualized small bowel and colon are within normal limits without significant mural thickening or distension. Vascular/Lymphatic: No significant vascular findings are present. No enlarged abdominal or pelvic lymph nodes. Other: Mild anasarca. No intra-abdominal fluid collections or evidence abdominal wall hernia. Musculoskeletal: No acute or significant osseous findings. IMPRESSION: 1. Marginal percutaneous window for gastrostomy access, though the stomach is decompressed. 2. Indeterminate ovoid hepatic hypodensity in the anterior aspect of segment 4 measuring up to 4.9 cm. This may represent a mass or focal fatty infiltration. Consider contrast enhanced ultrasound evaluation or MRI abdomen for further evaluation. 3. Consolidative subsegmental opacities in the dependent portion of right lower lobe as could be seen with aspiration pneumonia in the setting of dysphagia. Ruthann Cancer, MD [facility] Electronically Signed   By: Ruthann Cancer MD   On: 05/27/2021 08:15   CT HEAD WO CONTRAST  Result Date: 05/25/2021 CLINICAL DATA:  Mental status change, unknown cause. EXAM: CT HEAD WITHOUT CONTRAST TECHNIQUE: Contiguous axial images were obtained from the base of the skull through the vertex without intravenous contrast. COMPARISON:  Prior head CT examinations 01/18/2021 and earlier. 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 Associated subtle ex vacuo dilatation of the right lateral ventricle. Mild-to-moderate generalized cerebral atrophy. Comparatively mild cerebellar atrophy. An infarct is also now present within the right thalamus and adjacent posterior limb of right internal capsule, new as compared to the prior head CT, but age-indeterminate (although favored chronic). Chronic  appearing lacunar infarct within the left thalamus, not definitively present on the prior examination. Redemonstrated chronic lacunar infarct within the left basal ganglia. Background moderate patchy and ill-defined hypoattenuation within the cerebral white matter, nonspecific but compatible with chronic small vessel ischemic disease. There is no acute intracranial hemorrhage. No extra-axial fluid collection. No evidence of an intracranial mass. No midline shift. Vascular: No hyperdense vessel.  Atherosclerotic calcifications. Skull: Normal. Negative for fracture or focal lesion. Sinuses/Orbits: Visualized orbits show no acute finding. Trace scattered paranasal sinus mucosal thickening at the imaged levels. Other: Large right middle ear/mastoid effusion. A small left mastoid effusion is also present. Partially imaged right maxillofacial soft tissue swelling. 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 An infarct is also now present within the right thalamus and adjacent posterior limb of right internal capsule, new as compared to the prior head CT, but age-indeterminate (although favored chronic). Chronic appearing lacunar infarct within the left thalamus, not definitively present on the prior examination. Redemonstrated chronic lacunar infarct within the left basal ganglia. Background moderate chronic small vessel  ischemic changes within the cerebral white matter. Mild-to-moderate generalized cerebral atrophy with comparatively mild cerebellar atrophy. Large right middle ear/mastoid effusion. A small left mastoid effusion is also present. Partially imaged right maxillofacial soft tissue swelling, nonspecific. Electronically Signed   By:  Simmering DO   On: 05/25/2021 18:28   EEG adult  Result Date: 05/26/2021 Lora Havens, MD     05/26/2021  4:36 PM Patient Name: MARIYA AGA MRN: [id number] Epilepsy Attending: Lora Havens Referring Physician/Provider: Dr Hosie Poisson Date: 05/26/2021 Duration: 28.44 mins Patient history: 75 year old female with prior strokes with altered mental status.  EEG to evaluate for seizures. Level of alertness: Awake AEDs during EEG study: GBP Technical aspects: This EEG study was done with scalp electrodes positioned according to the 10-20 International system of electrode placement. Electrical activity was acquired at a sampling rate of '500Hz'$  and reviewed with a high frequency filter of '70Hz'$  and a low frequency filter of '1Hz'$ . EEG data were recorded continuously and digitally stored. Description: This EEG study was done with scalp electrodes positioned according to the 10-20 International system of electrode placement. Electrical activity was acquired at a sampling rate of '500Hz'$  and reviewed with a high frequency filter of '70Hz'$  and a low frequency filter of '1Hz'$ . EEG data were recorded continuously and digitally stored. Description: EEG showed continuous low amplitude 2 to 3 Hz delta slowing in right hemisphere as well as 5 to 6 Hz theta slowing in left hemisphere. Hyperventilation and photic stimulation were not performed.   ABNORMALITY -Continuous slow, generalized and lateralized right hemisphere IMPRESSION: This study is suggestive of cortical dysfunction arising from right hemisphere likely secondary to underlying structural abnormality/stroke.  There is also moderate diffuse encephalopathy, nonspecific etiology.  No seizures or epileptiform discharges were seen throughout the recording. Priyanka Barbra Sarks   Medications:  sodium chloride     thiamine injection 500 mg (05/27/21 0517)   Followed by   Derrill Memo ON 05/28/2021] thiamine injection      atorvastatin  40 mg Oral Daily   Chlorhexidine Gluconate Cloth  6 each Topical Q0600   darbepoetin (ARANESP) injection - DIALYSIS  100 mcg Intravenous Q Wed-HD   gabapentin  100 mg Oral QHS   heparin  injection (subcutaneous)  5,000 Units Subcutaneous Q8H   midodrine  10 mg Oral TID   mirtazapine  7.5 mg Oral QHS   pantoprazole  40 mg Oral Q0600   sodium chloride flush  3 mL Intravenous Q12H   [START ON 06/03/2021] thiamine injection  100 mg Intravenous Daily   venlafaxine XR  75 mg Oral Q breakfast     Dialysis Orders:  AF MWF 3h 77mn 400/500    62.5kg    3K/2.5 Ca    P2  AVG HeRO   Hep none - Mircera 50 q 2 wks  (7/13)  Venofer 50 q wk - Parsabiv '5mg'$  IV q HD   Assessment/Plan: Melena/Rectal bleeding - GI consulted. Hx rectal ulcers.  EGD 7/27 normal and flex sig with mucosal ulceration c/w sterocoral ulcers, non bleeding.   AMS: Neurology consulted. Repeat MRI today. Results pendning. Palliative care consulted to establish goals of care. Pt is now DNR, no artificial feeds, no colonscopy for now. PCT will reassess and meet w/ family again today. Patient's mental status is very poor, she is hallucinating regularly. She has not made any improvement since admission.  Ammonia level elevated. Clearly her QOL is not being enhanced by dialysis ESRD: HD MWF schedule normally  Try to dialyze her in the chair which has not been successful, not attempting today. When last seen at [facility] she was slumped over in chair, not able to hold herself up straight.  Discussions about benefits vs con's of aggressive Rx (e.g., HD) for this patient are in progress w/ PCT.  Next HD 05/29/2021--  will change to TTS schedule to help the inpatient dialysis staff while here. Try lower BFR and see if this makes any difference with mental status. As stated previously -  I really dont feel she is a candidate for OP dialysis given confusion and weakness-  dont think overall dialysis is enhancing her QOL LUE edema: Had f'gram 6/23 which showed chronic thrombus and was started on Eliquis - currently on hold due to #1. 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  Hypertension/volume  - BP soft. On midodrine for BP support.  No LE edema. Lungs  clear. Under edw if weights correct. Does not seem to be overloaded Ran even 08/01. Minimal UF 0.5-1 liters.  Anemia  - Hgb trending down, last 8.8. on ESA, weekly Fe. Continue Aranesp 118mg q Wed. Metabolic bone disease -  Corr Ca and phos ok.  Continue to monitor. No binders currently  Nutrition: Poorly eating per notes. Planning for peg placement.  Recent CVA  Rita H. Brown NP-C 05/27/2021, 9:31 AM  [facility] [phone] Patient seen and examined, agree with above note with above modifications. Lunch plate has been fed to her-  whe I asked her if she wanted more she said "that is her plate" c/o pain today unclear etiology-  GOC discussions ongoing-  neuro consult -  small work up being done-  I am changing her HD schedule to TTS while here to help the dialysis staff KCorliss Parish MD 05/27/2021

## 2021-05-27 NOTE — Progress Notes (Signed)
Patient ID: [name], female   DOB: Oct 06, 1946, 75 y.o.   MRN: [id number]    Progress Note from the Palliative Medicine Team at [facility]   Patient Name: Amy [name]        Date: 05/27/2021 DOB: 1946-09-23  Age: 75 y.o. MRN#: [id number] Attending Physician: Hosie Poisson, MD Primary Care Physician: [name], MD Admit Date: 05/19/2021   Medical records reviewed, spoke to attending, assessed patient in HD.  Patient is lethargic, non verbal currently and unable to follow commands.   This NP spoke to son/Brian by phone as a follow up to last week's conversations and to further clarify Arley his conversation with attending over the past few days specific to neurologic evaluation.   He continues to verbalize concern over his mothers nutritional status and inability to take in adequate fluids and nutrition.[name]   He is considering artificial feeding/PEG.   Education offered on risks and benefits of artificial feeding/ PEG tube, and patient's high risk for aspiration secondary to lethargy.  Education offered again today regarding the difference between an aggressive medical intervention path and a palliative comfort path  Plan of care: -DNR/DNI -no artificial feeding for now, continue conversation with family members and medical team -continue to treat the treatable, family continues to hope for improvement  Again stressed the importance of continued conversation with his family and the medical providers regarding overall plan of care and treatment options,  ensuring decisions are within the context of the patients values and GOCs.  Questions and concerns addressed   Discussed with Dr  Karleen Hampshire  Total time spent was 35 minutes  Greater than 50% of the time was spent in counseling and coordination of care  Wadie Lessen NP  Palliative Medicine Team Team Phone # [phone] Pager [phone]

## 2021-05-28 ENCOUNTER — Inpatient Hospital Stay (HOSPITAL_COMMUNITY): Payer: Medicare Other

## 2021-05-28 ENCOUNTER — Encounter (HOSPITAL_COMMUNITY): Payer: Self-pay | Admitting: Internal Medicine

## 2021-05-28 DIAGNOSIS — E785 Hyperlipidemia, unspecified: Secondary | ICD-10-CM | POA: Diagnosis not present

## 2021-05-28 DIAGNOSIS — D638 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: Secondary | ICD-10-CM | POA: Diagnosis not present

## 2021-05-28 DIAGNOSIS — R4182 Altered mental status, unspecified: Secondary | ICD-10-CM

## 2021-05-28 DIAGNOSIS — I6330 Cerebral infarction due to thrombosis of unspecified cerebral artery: Secondary | ICD-10-CM

## 2021-05-28 DIAGNOSIS — N186 End stage renal disease: Secondary | ICD-10-CM | POA: Diagnosis not present

## 2021-05-28 DIAGNOSIS — K922 Gastrointestinal hemorrhage, unspecified: Secondary | ICD-10-CM | POA: Diagnosis not present

## 2021-05-28 DIAGNOSIS — E43 Unspecified severe protein-calorie malnutrition: Secondary | ICD-10-CM | POA: Diagnosis present

## 2021-05-28 LAB — LIPID PANEL
Cholesterol: 133 mg/dL (ref 0–200)
HDL: 42 mg/dL (ref >40)
LDL Cholesterol: 68 mg/dL (ref 0–99)
Total CHOL/HDL Ratio: 3.2 RATIO
Triglycerides: 117 mg/dL (ref <150)
VLDL: 23 mg/dL (ref 0–40)

## 2021-05-28 LAB — GLUCOSE, CAPILLARY
Glucose-Capillary: 99 mg/dL (ref 70–99)
Glucose-Capillary: 102 mg/dL — ABNORMAL HIGH (ref 70–99)
Glucose-Capillary: 90 mg/dL (ref 70–99)
Glucose-Capillary: 97 mg/dL (ref 70–99)

## 2021-05-28 LAB — HEMOGLOBIN A1C
Hgb A1c MFr Bld: 5.1 % (ref 4.8–5.6)
Mean Plasma Glucose: 99.67 mg/dL

## 2021-05-28 LAB — METHYLMALONIC ACID, SERUM: Methylmalonic Acid, Quantitative: 141 nmol/L (ref 0–378)

## 2021-05-28 MED ORDER — CEFAZOLIN SODIUM-DEXTROSE 2-4 GM/100ML-% IV SOLN: 2.0000 g | INTRAVENOUS | Status: AC

## 2021-05-28 MED ORDER — STROKE: EARLY STAGES OF RECOVERY BOOK
Freq: Once | Status: DC
  Filled 2021-05-28: qty 1

## 2021-05-28 MED ORDER — PROSOURCE PLUS PO LIQD
30.0000 mL | Freq: Two times a day (BID) | ORAL | Status: DC
  Administered 2021-05-28 – 2021-06-10 (×8): 30 mL via ORAL
  Filled 2021-05-28 (×8): qty 30

## 2021-05-28 MED ORDER — SODIUM CHLORIDE 0.9 % IV SOLN
1.0000 g | INTRAVENOUS | Status: AC
  Administered 2021-05-28 – 2021-05-30 (×3): 1 g via INTRAVENOUS
  Filled 2021-05-28 (×3): qty 10

## 2021-05-28 MED ORDER — DARBEPOETIN ALFA 100 MCG/0.5ML IJ SOSY
100.0000 ug | PREFILLED_SYRINGE | INTRAMUSCULAR | Status: DC
  Administered 2021-05-29: 100 ug via INTRAVENOUS

## 2021-05-28 MED ORDER — NEPRO/CARBSTEADY PO LIQD
237.0000 mL | Freq: Three times a day (TID) | ORAL | Status: DC
  Administered 2021-05-28 – 2021-06-10 (×18): 237 mL via ORAL

## 2021-05-28 MED ORDER — RENA-VITE PO TABS
1.0000 | ORAL_TABLET | Freq: Every day | ORAL | Status: DC
  Administered 2021-05-28 – 2021-06-13 (×15): 1 via ORAL
  Filled 2021-05-28 (×17): qty 1

## 2021-05-28 MED ORDER — ASPIRIN EC 81 MG PO TBEC
81.0000 mg | DELAYED_RELEASE_TABLET | Freq: Every day | ORAL | Status: DC
  Administered 2021-05-29 – 2021-06-06 (×8): 81 mg via ORAL
  Filled 2021-05-28 (×8): qty 1

## 2021-05-28 MED ORDER — SODIUM CHLORIDE 0.9 % IV SOLN: 1.0000 g | INTRAVENOUS | Status: DC

## 2021-05-28 NOTE — H&P (Addendum)
Chief Complaint: Patient was seen in consultation today for image guided gastrostomy tube placement Chief Complaint  Patient presents with   Rectal Bleeding   at the request of Dr. Karleen Hampshire, V.  Referring Physician(s): Dr. Karleen Hampshire, V.  Supervising Physician: Mir, Biochemist, clinical  Patient Status: [facility] - In-pt  History of Present Illness: Amy [name] is a 75 y.o. 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  Patient underwent EGD and sigmoidoscopy with GI which showed stercoral ulcers, GI recommended a colonoscopy for further evaluation.  Patient is currently admitted for further evaluation and management of GI bleed and anemia. During this admission, patient's oral intake has been minimum.  A feeding tube placement for long-term nutritional support was recommended to the patient's family members, and palliative care met with the family members to discuss GOCs. After thorough discussion and shared decision making, patient's son [name] decided to proceed with the feeding tube placement.  IR was requested for image guided gastrostomy tube placement. CT abdomen without contrast was obtained for further anatomy evaluation on 05/27/2021, anatomy is amendable for percutaneous gastrostomy tube placement per Dr. Serafina Royals.    Patient was evaluated at the bedside. Patient laying in bed sleeping, not in acute distress.  Patient arousable to verbal stimuli, however, continues to fall asleep. ROS was not obtained.  Past Medical History:  Diagnosis Date   Chronic kidney disease    dialysis M-W-F - dialysis cath located in right side of chest   Diabetes mellitus without complication ([place])    type 2 - no meds    HLD (hyperlipidemia)    diet controlled   Hypertension    no meds    Past Surgical History:  Procedure Laterality Date   A/V FISTULAGRAM Left 01/10/2021    Procedure: A/V FISTULAGRAM;  Surgeon: Angelia Mould, MD;  Location: [place] [facility];  Service: Cardiovascular;  Laterality: Left;   A/V FISTULAGRAM N/A 01/27/2021   Procedure: A/V FISTULAGRAM;  Surgeon: Waynetta Sandy, MD;  Location: [place] [facility];  Service: Cardiovascular;  Laterality: N/A;   AV FISTULA PLACEMENT Left 09/24/2020   Procedure: LEFT UPPER ARM ARTERIOVENOUS GORTEX  GRAFT;  Surgeon: Elam Dutch, MD;  Location: [place];  Service: Vascular;  Laterality: Left;   BREAST CYST EXCISION Right 2011   cyst removed -neg   ESOPHAGOGASTRODUODENOSCOPY (EGD) WITH PROPOFOL N/A 05/21/2021   Procedure: ESOPHAGOGASTRODUODENOSCOPY (EGD) WITH PROPOFOL;  Surgeon: Jackquline Denmark, MD;  Location: [facility];  Service: Endoscopy;  Laterality: N/A;   EYE SURGERY Bilateral    cataracts   FLEXIBLE SIGMOIDOSCOPY N/A 05/21/2021   Procedure: FLEXIBLE SIGMOIDOSCOPY;  Surgeon: Jackquline Denmark, MD;  Location: [facility] ENDOSCOPY;  Service: Endoscopy;  Laterality: N/A;   INSERTION OF DIALYSIS CATHETER Right 07/2019   PERIPHERAL VASCULAR BALLOON ANGIOPLASTY  01/10/2021   Procedure: PERIPHERAL VASCULAR BALLOON ANGIOPLASTY;  Surgeon: Angelia Mould, MD;  Location: [place] [facility];  Service: Cardiovascular;;  left AV graft   TUBAL LIGATION     Ultrasound-guided cannulation left upper arm AV graft  01/27/2021    Allergies: Sensipar [cinacalcet] and Statins  Medications: Prior to Admission medications   Medication Sig Start Date End Date Taking? Authorizing Provider  acetaminophen (TYLENOL) 500 MG tablet Take 1,000 mg by mouth every 6 (six) hours as needed for mild pain.   Yes [provider]  apixaban Arne Cleveland)  5 MG TABS tablet Take 5 mg by mouth 2 (two) times daily.   Yes [provider]  atorvastatin (LIPITOR) 40 MG tablet Take 40 mg by mouth daily.   Yes [provider]  dibucaine (NUPERCAINAL) 1 % OINT Place 1 application rectally every 2 (two) hours as  needed (pain).   Yes [provider]  diclofenac Sodium (VOLTAREN) 1 % GEL Apply 2 g topically 4 (four) times daily.   Yes [provider]  FLUoxetine (PROZAC) 40 MG capsule Take 40 mg by mouth daily.   Yes [provider]  gabapentin (NEURONTIN) 100 MG capsule Take 100 mg by mouth at bedtime.   Yes [provider]  hydrocortisone 1 % ointment Apply 1 application topically 2 (two) times daily. Left side of neck   Yes [provider]  lactulose (CHRONULAC) 10 GM/15ML solution Take 60 mLs (40 g total) by mouth daily as needed for mild constipation. Patient taking differently: Take 27 g by mouth daily as needed for mild constipation. 03/11/21  Yes [name], MD  lidocaine (LIDODERM) 5 % Place 1 patch onto the skin See admin instructions. Apply to right neck in the morning and remove at bedtime   Yes [provider]  lidocaine-prilocaine (EMLA) cream Apply 1 application topically every Monday, Wednesday, and Friday. 1-2  Hours prior to dialysis 01/17/21  Yes [provider]  midodrine (PROAMATINE) 10 MG tablet Take 10 mg by mouth in the morning and at bedtime.   Yes [provider]  mirtazapine (REMERON) 7.5 MG tablet Take 7.5 mg by mouth at bedtime.   Yes [provider]  multivitamin (RENA-VIT) TABS tablet Take 1 tablet by mouth daily.   Yes [provider]  Nutritional Supplements (ENSURE CLEAR PO) Take 237 mLs by mouth in the morning, at noon, and at bedtime.   Yes [provider]  nystatin cream (MYCOSTATIN) Apply 1 application topically See admin instructions. Apply to groin as needed x 14 days for yeast   Yes [provider]  ondansetron (ZOFRAN) 4 MG tablet Take 4 mg by mouth every 6 (six) hours as needed for nausea or vomiting.   Yes [provider]  oxyCODONE (OXY IR/ROXICODONE) 5 MG immediate release tablet Take 5 mg by mouth every 12 (twelve) hours.   Yes [provider]  pantoprazole (PROTONIX) 20 MG tablet Take 1 tablet (20 mg total) by mouth daily. 03/11/21  Yes [name], MD  venlafaxine XR (EFFEXOR-XR) 75 MG 24 hr capsule Take 75 mg by mouth daily. 02/03/21  Yes [provider]     Family History  Problem Relation Age of Onset   Breast cancer Neg Hx     Social History   Socioeconomic History   Marital status: Single    Spouse name: Not on file   Number of children: Not on file   Years of education: Not on file   Highest education level: Not on file  Occupational History   Not on file  Tobacco Use   Smoking status: Former   Smokeless tobacco: Never  Vaping Use   Vaping Use: Never used  Substance and Sexual Activity   Alcohol use: Yes    Comment: occasional   Drug use: Not Currently    Types: Marijuana    Comment: Last use 2020   Sexual activity: Not on file    Comment: Tubal  Other Topics Concern   Not on file  Social History Narrative  Not on file   Social Determinants of Health   Financial Resource Strain: Not on file  Food Insecurity: Not on file  Transportation Needs: Not on file  Physical Activity: Not on file  Stress: Not on file  Social Connections: Not on file     Review of Systems: A 12 point ROS discussed and pertinent positives are indicated in the HPI above.  All other systems are negative.   Vital Signs: BP 118/85   Pulse 85   Temp 98.4 F (36.9 C) (Oral)   Resp 14   Ht _0  (1.702 m)   Wt 128 lb 15.5 oz (58.5 kg)   LMP  (LMP Unknown)   SpO2 100%   BMI 20.20 kg/m   Physical Exam Vitals reviewed.  Constitutional:      General: She is not in acute distress.    Appearance: She is not ill-appearing.     Comments: Appears lethargic  HENT:     Mouth/Throat:     Mouth: Mucous membranes are moist.  Cardiovascular:     Rate and Rhythm: Normal rate and regular rhythm.     Pulses: Normal pulses.     Heart sounds: Normal heart sounds.  Pulmonary:     Effort:  Pulmonary effort is normal.     Breath sounds: Wheezing present.     Comments: Expiratory wheezing on right lobes Abdominal:     General: Abdomen is flat. Bowel sounds are normal.     Palpations: Abdomen is soft.  Musculoskeletal:     Cervical back: Neck supple.  Skin:    General: Skin is warm and dry.     Coloration: Skin is not jaundiced or pale.  Neurological:     Comments: Patient arousable to verbal stimuli, however, kept falling asleep during assessment.    MD Evaluation Airway: WNL Heart: WNL Abdomen: WNL Chest/ Lungs: WNL ASA  Classification: 3 Mallampati/Airway Score: Two  Imaging: CT ABDOMEN WO CONTRAST  Result Date: 05/27/2021 CLINICAL DATA:  75 year old female with history of dysphagia. Evaluate for gastrostomy tube placement. EXAM: CT ABDOMEN WITHOUT CONTRAST TECHNIQUE: Multidetector CT imaging of the abdomen was performed following the standard protocol without IV contrast. COMPARISON:  06/26/2020 FINDINGS: Lower chest: Right basilar subsegmental consolidative opacity in trace right pleural effusion. The heart is normal in size. No pericardial effusion. Hepatobiliary: Increased conspicuity of a hypoattenuating ovoid region in the anterior aspect of segment 4 measuring approximately 4.9 x 2.2 x 2.1 cm, previously too inconspicuous to measure. The liver is normal in size and contour. The gallbladder is present unremarkable. No intra or extrahepatic biliary ductal dilation. Pancreas: Unremarkable. No pancreatic ductal dilatation or surrounding inflammatory changes. Spleen: Normal in size without focal abnormality. Adrenals/Urinary Tract: Adrenal glands are unremarkable. Kidneys are normal, without renal calculi, focal lesion, or hydronephrosis. Stomach/Bowel: Marginal percutaneous window for gastrostomy access due to overlying left lobe of the liver and transverse colon. The remaining visualized small bowel and colon are within normal limits without significant mural thickening or  distension. Vascular/Lymphatic: No significant vascular findings are present. No enlarged abdominal or pelvic lymph nodes. Other: Mild anasarca. No intra-abdominal fluid collections or evidence abdominal wall hernia. Musculoskeletal: No acute or significant osseous findings. IMPRESSION: 1. Marginal percutaneous window for gastrostomy access, though the stomach is decompressed. 2. Indeterminate ovoid hepatic hypodensity in the anterior aspect of segment 4 measuring up to 4.9 cm. This Amy represent a mass or focal fatty infiltration. Consider contrast enhanced ultrasound evaluation or MRI abdomen for further evaluation. 3.  Consolidative subsegmental opacities in the dependent portion of right lower lobe as could be seen with aspiration pneumonia in the setting of dysphagia. Ruthann Cancer, MD [facility] Electronically Signed   By: Ruthann Cancer MD   On: 05/27/2021 08:15   CT HEAD WO CONTRAST  Result Date: 05/25/2021 CLINICAL DATA:  Mental status change, unknown cause. EXAM: CT HEAD WITHOUT CONTRAST TECHNIQUE: Contiguous axial images were obtained from the base of the skull through the vertex without intravenous contrast. COMPARISON:  Prior head CT examinations 01/18/2021 and earlier. 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 Associated subtle ex vacuo dilatation of the right lateral ventricle. Mild-to-moderate generalized cerebral atrophy. Comparatively mild cerebellar atrophy. An infarct is also now present within the right thalamus and adjacent posterior limb of right internal capsule, new as compared to the prior head CT, but age-indeterminate (although favored chronic). Chronic appearing lacunar infarct within the left thalamus, not definitively present on the prior examination. Redemonstrated chronic lacunar infarct  within the left basal ganglia. Background moderate patchy and ill-defined hypoattenuation within the cerebral white matter, nonspecific but compatible with chronic small vessel ischemic disease. There is no acute intracranial hemorrhage. No extra-axial fluid collection. No evidence of an intracranial mass. No midline shift. Vascular: No hyperdense vessel.  Atherosclerotic calcifications. Skull: Normal. Negative for fracture or focal lesion. Sinuses/Orbits: Visualized orbits show no acute finding. Trace scattered paranasal sinus mucosal thickening at the imaged levels. Other: Large right middle ear/mastoid effusion. A small left mastoid effusion is also present. Partially imaged right maxillofacial soft tissue swelling. 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 An infarct is also now present within the right thalamus and adjacent posterior limb of right internal capsule, new as compared to the prior head CT, but age-indeterminate (although favored chronic). Chronic appearing lacunar infarct within the left thalamus, not definitively present on the prior examination. Redemonstrated chronic lacunar infarct within the left basal ganglia. Background moderate chronic small vessel ischemic changes within the cerebral white matter. Mild-to-moderate generalized cerebral atrophy with comparatively mild cerebellar atrophy. Large right middle ear/mastoid effusion. A small left mastoid effusion is also present. Partially imaged right maxillofacial soft tissue swelling, nonspecific. Electronically Signed   By: Kellie Simmering DO   On: 05/25/2021 18:28   CT Soft Tissue Neck Wo Contrast  Result Date: 05/01/2021 CLINICAL DATA:  Initial evaluation for neck abscess, history of retropharyngeal fluid on 05/22. EXAM: CT NECK WITHOUT CONTRAST TECHNIQUE: Multidetector CT imaging of the neck was performed  following the standard protocol without intravenous contrast. COMPARISON:  Prior CT from 01/08/2021. FINDINGS: Pharynx and larynx: Oral cavity within normal limits with no visible discrete mass or collection. No localized inflammatory changes seen about the dentition. [place]ine tonsils within normal limits. Few subcentimeter calcified tonsilliths noted on the right. Nasopharynx and the remainder of the oropharynx within normal limits. Diffuse retro pharyngeal edema/effusion is seen, similar to previous exam. No discrete loculated collection to suggest abscess. Epiglottis within normal limits. Vallecula clear. Remainder of the hypopharynx and supraglottic larynx within normal limits. No significant narrowing of the supraglottic airway by the retropharyngeal effusion at this time. Glottis normal. Subglottic airway patent clear. Salivary glands: Salivary glands including the parotid and submandibular glands are within normal limits. Thyroid: Unremarkable. Lymph nodes: No enlarged or pathologic adenopathy  seen within the neck. Vascular: Mild atheromatous plaque noted about the aortic arch and visualized carotid siphons. Evaluation of the vascular structures otherwise limited given lack of IV contrast. Limited intracranial: Unremarkable. Visualized orbits: Visualized globes and orbital soft tissues within normal limits. Sequelae of bilateral ocular lens replacement noted. Mastoids and visualized paranasal sinuses: Small retention cyst noted within the dominant right sphenoid sinus. Visualized paranasal sinuses are otherwise clear. Right mastoid and middle ear effusion is seen. Smaller left-sided mastoid effusion present as well. Again, no abnormality seen about the nasopharynx. Skeleton: No visible acute osseous finding. 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 No discrete or worrisome osseous lesions. Mild cervical spondylosis without  high-grade spinal stenosis. Upper chest: Previously identified 5 mm left upper lobe nodule is decreased in size and slightly more regular with ground-glass appearance now measuring 4 mm, suggesting a resolving infectious and/or inflammatory nodule (series 4, image 30). Small layering bilateral pleural effusions partially visualized. Visualized upper chest demonstrates no other acute finding. Other: Diffuse soft tissue stranding seen throughout the subcutaneous fat of the neck. Involvement of the bilateral parotid and submandibular spaces as well as the submental region. Extension to involve the posterior neck and visualized upper chest wall. Appearance is similar to previous, and could be related to overall volume status/anasarca. IMPRESSION: 1. Diffuse soft tissue stranding throughout the subcutaneous fat of the neck with associated layering retropharyngeal effusion. Appearance is similar to prior neck CT from 01/08/2021, and could be related to overall volume status/anasarca. An acute inflammatory response/angioedema or possibly infection/cellulitis would be the primary differential considerations. No discrete abscess or drainable fluid collection. 2. Right mastoid and middle ear effusion, with smaller left-sided mastoid effusion. Correlation with physical exam for possible otomastoiditis recommended. 3. Small layering bilateral pleural effusions, partially visualized. 4. Interval decrease in size of 4 mm left upper lobe nodule, likely a resolving infectious and/or inflammatory nodule. Continued attention at follow-up recommended. Electronically Signed   By: Jeannine Boga M.D.   On: 05/01/2021 23:45   MR ANGIO HEAD WO CONTRAST  Result Date: 05/27/2021 CLINICAL DATA:  Stroke follow-up. EXAM: MRI HEAD WITHOUT CONTRAST MRA HEAD WITHOUT CONTRAST TECHNIQUE: Multiplanar, multi-echo pulse sequences of the brain and surrounding structures were acquired without intravenous contrast. Angiographic images of the  Circle of Willis were acquired using MRA technique without intravenous contrast. COMPARISON:  Head CT May 25, 2021 FINDINGS: The study is severely degraded by motion. MRI HEAD FINDINGS Brain: Area of restricted diffusion involving right frontoparietal, posterior temporal region and lateral aspect of the right occipital lobe, consistent with acute/subacute infarct. Scattered and confluent foci of T2 hyperintensity are seen within the white matter of the cerebral hemispheres, nonspecific, most likely related to chronic small vessel ischemia. Scattered susceptibility artifact in the parietal cortex is suggestive of petechial hemorrhage. Moderate parenchymal volume loss. No hemorrhage, hydrocephalus or mass lesion. Vascular: Normal flow voids. Skull and upper cervical spine: Grossly unremarkable. Sinuses/Orbits: No acute findings Other: Prominent bilateral mastoid effusion. MRA HEAD FINDINGS Anterior circulation: Flow related enhancement is seen in the bilateral intracranial internal carotid arteries, bilateral A1/ACA and M1/MCA segments. Evaluation of distal branches is precluded by motion artifact. Posterior circulation: Flow related enhancement is seen in the intracranial bilateral vertebral arteries, basilar arteries and bilateral P1/PCA segments. Evaluation of distal branches is precluded by motion artifacts. Anatomic variants: None significant. IMPRESSION: 1. Large right MCA territory infarct, as described above. 2. Advanced chronic microvascular ischemic changes of the white matter and  moderate parenchymal loss. 3. MR angiogram severely degraded by motion. No intracranial ICA or basilar artery occlusion. Electronically Signed   By: Pedro Earls M.D.   On: 05/27/2021 09:51   MR BRAIN WO CONTRAST  Result Date: 05/27/2021 CLINICAL DATA:  Stroke follow-up. EXAM: MRI HEAD WITHOUT CONTRAST MRA HEAD WITHOUT CONTRAST TECHNIQUE: Multiplanar, multi-echo pulse sequences of the brain and surrounding  structures were acquired without intravenous contrast. Angiographic images of the Circle of Willis were acquired using MRA technique without intravenous contrast. COMPARISON:  Head CT May 25, 2021 FINDINGS: The study is severely degraded by motion. MRI HEAD FINDINGS Brain: Area of restricted diffusion involving right frontoparietal, posterior temporal region and lateral aspect of the right occipital lobe, consistent with acute/subacute infarct. Scattered and confluent foci of T2 hyperintensity are seen within the white matter of the cerebral hemispheres, nonspecific, most likely related to chronic small vessel ischemia. Scattered susceptibility artifact in the parietal cortex is suggestive of petechial hemorrhage. Moderate parenchymal volume loss. No hemorrhage, hydrocephalus or mass lesion. Vascular: Normal flow voids. Skull and upper cervical spine: Grossly unremarkable. Sinuses/Orbits: No acute findings Other: Prominent bilateral mastoid effusion. MRA HEAD FINDINGS Anterior circulation: Flow related enhancement is seen in the bilateral intracranial internal carotid arteries, bilateral A1/ACA and M1/MCA segments. Evaluation of distal branches is precluded by motion artifact. Posterior circulation: Flow related enhancement is seen in the intracranial bilateral vertebral arteries, basilar arteries and bilateral P1/PCA segments. Evaluation of distal branches is precluded by motion artifacts. Anatomic variants: None significant. IMPRESSION: 1. Large right MCA territory infarct, as described above. 2. Advanced chronic microvascular ischemic changes of the white matter and moderate parenchymal loss. 3. MR angiogram severely degraded by motion. No intracranial ICA or basilar artery occlusion. Electronically Signed   By: Pedro Earls M.D.   On: 05/27/2021 09:51   DG Chest Port 1 View  Result Date: 05/01/2021 CLINICAL DATA:  Evaluate for pneumonia. Decreased appetite for 3 months. Low back pain. EXAM:  PORTABLE CHEST 1 VIEW COMPARISON:  Chest radiograph 03/10/2021.  CT 01/08/2021 FINDINGS: Chronic elevation of right hemidiaphragm with adjacent atelectasis. No acute airspace disease. Normal heart size with stable mediastinal contours. Aortic atherosclerosis. No pulmonary edema, pleural effusion, or pneumothorax. No acute osseous abnormalities are seen. IMPRESSION: Chronic elevation of right hemidiaphragm with adjacent atelectasis. No evidence of pneumonia. Electronically Signed   By: Keith Rake M.D.   On: 05/01/2021 23:16   EEG adult  Result Date: 05/26/2021 Lora Havens, MD     05/26/2021  4:36 PM Patient Name: Amy [name] MRN: [id number] Epilepsy Attending: Lora Havens Referring Physician/Provider: Dr Hosie Poisson Date: 05/26/2021 Duration: 28.44 mins Patient history: 75 year old female with prior strokes with altered mental status.  EEG to evaluate for seizures. Level of alertness: Awake AEDs during EEG study: GBP Technical aspects: This EEG study was done with scalp electrodes positioned according to the 10-20 International system of electrode placement. Electrical activity was acquired at a sampling rate of _0  and reviewed with a high frequency filter of _1  and a low frequency filter of _2 . EEG data were recorded continuously and digitally stored. Description: This EEG study was done with scalp electrodes positioned according to the 10-20 International system of electrode placement. Electrical activity was acquired at a sampling rate of _3  and reviewed with a high frequency filter of _4  and a low frequency filter of _5 . EEG data were recorded continuously and digitally stored. Description: EEG showed continuous low amplitude 2 to 3  Hz delta slowing in right hemisphere as well as 5 to 6 Hz theta slowing in left hemisphere. Hyperventilation and photic stimulation were not performed.   ABNORMALITY -Continuous slow, generalized and lateralized right hemisphere IMPRESSION: This study  is suggestive of cortical dysfunction arising from right hemisphere likely secondary to underlying structural abnormality/stroke.  There is also moderate diffuse encephalopathy, nonspecific etiology.  No seizures or epileptiform discharges were seen throughout the recording. Priyanka Barbra Sarks   VAS US CAROTID  Result Date: 05/28/2021 Carotid Arterial Duplex Study Patient Name:  Amy [name]  Date of Exam:   05/28/2021 Medical Rec #: [id number]         Accession #:    [id number] Date of Birth: July 12, 1946         Patient Gender: F Patient Age:   75Y Exam Location:  [facility] Procedure:      VAS US CAROTID Referring Phys: [id number] [place] --------------------------------------------------------------------------------  Indications:       CVA. Risk Factors:      Hypertension, hyperlipidemia, Diabetes. Comparison Study:  no prior Performing Technologist: Archie Patten RVS  Examination Guidelines: A complete evaluation includes B-mode imaging, spectral Doppler, color Doppler, and power Doppler as needed of all accessible portions of each vessel. Bilateral testing is considered an integral part of a complete examination. Limited examinations for reoccurring indications Amy be performed as noted.  Right Carotid Findings: +----------+--------+--------+--------+------------------+--------+           PSV cm/sEDV cm/sStenosisPlaque DescriptionComments +----------+--------+--------+--------+------------------+--------+ CCA Prox  83      8               heterogenous               +----------+--------+--------+--------+------------------+--------+ CCA Distal62      7               heterogenous               +----------+--------+--------+--------+------------------+--------+ ICA Prox  80      13      1-39%   heterogenous               +----------+--------+--------+--------+------------------+--------+ ICA Distal71      16                                          +----------+--------+--------+--------+------------------+--------+ ECA       94                                                 +----------+--------+--------+--------+------------------+--------+ +----------+--------+-------+--------+-------------------+           PSV cm/sEDV cmsDescribeArm Pressure (mmHG) +----------+--------+-------+--------+-------------------+ [id number]                                         +----------+--------+-------+--------+-------------------+ +---------+--------+--+--------+-+---------+ VertebralPSV cm/s61EDV cm/s7Antegrade +---------+--------+--+--------+-+---------+  Left Carotid Findings: +----------+--------+--------+--------+------------------+--------+           PSV cm/sEDV cm/sStenosisPlaque DescriptionComments +----------+--------+--------+--------+------------------+--------+ CCA Prox  73      8               heterogenous               +----------+--------+--------+--------+------------------+--------+  CCA Distal49      11              heterogenous               +----------+--------+--------+--------+------------------+--------+ ICA Prox  68      16      1-39%   heterogenous               +----------+--------+--------+--------+------------------+--------+ ICA Distal106     21                                         +----------+--------+--------+--------+------------------+--------+ ECA       35                                                 +----------+--------+--------+--------+------------------+--------+ +----------+--------+--------+--------+-------------------+           PSV cm/sEDV cm/sDescribeArm Pressure (mmHG) +----------+--------+--------+--------+-------------------+ Subclavian                fistula                     +----------+--------+--------+--------+-------------------+ +---------+--------+--+--------+-+---------+ VertebralPSV cm/s52EDV cm/s9Antegrade  +---------+--------+--+--------+-+---------+   Summary: Right Carotid: Velocities in the right ICA are consistent with a 1-39% stenosis. Left Carotid: Velocities in the left ICA are consistent with a 1-39% stenosis. Vertebrals: Bilateral vertebral arteries demonstrate antegrade flow. *See table(s) above for measurements and observations.  Electronically signed by Antony Contras MD on 05/28/2021 at 1:04:48 PM.    Final     Labs:  CBC: Recent Labs    05/23/21 0733 05/24/21 1256 05/26/21 0334 05/27/21 0531  WBC 6.3 8.9 10.5 11.9*  HGB 8.5* 8.8* 8.4* 8.8*  HCT 25.8* 29.0* 26.0* 28.1*  PLT 210 209 216 203    COAGS: Recent Labs    01/26/21 2017 03/10/21 1228  INR 1.0 0.9  APTT 32  --     BMP: Recent Labs    05/23/21 0733 05/24/21 1256 05/26/21 0334 05/27/21 0531  NA 132* 134* 136 136  K 2.9* 4.1 3.5 3.7  CL 95* 97* 98 97*  CO2 26 21* 27 26  GLUCOSE 121* 96 106* 151*  BUN _0 CALCIUM 8.0* 8.1* 8.1* 8.3*  CREATININE 2.49* 3.97* 5.62* 3.75*  GFRNONAA 20* 11* 7* 12*    LIVER FUNCTION TESTS: Recent Labs    05/01/21 2147 05/19/21 0920 05/20/21 0448 05/21/21 1700 05/21/21 2145 05/22/21 0321 05/23/21 0733 05/27/21 0531  BILITOT 0.9 0.9 0.6  --   --   --   --  0.8  AST 38 57* 55*  --   --   --   --  34  ALT _1 --   --   --   --  26  ALKPHOS 68 76 68  --   --   --   --  78  PROT 6.8 5.9* 5.7*  --   --   --   --  6.4*  ALBUMIN 3.2* 2.4* 2.3*   < > 2.2* 2.2* 2.6* 2.4*   < > = values in this interval not displayed.    TUMOR MARKERS: No results for input(s): AFPTM, CEA, CA199, CHROMGRNA in the last 8760 hours.  Assessment and Plan: 75 y.o. 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  Patient's oral intake has been poor, a feeding tube placement for prior nutrition support was recommended to patient's family member.  After thorough discussion and  so decision making, patient's son decided to proceed with the feeding tube placement.  IR was requested for image guided gastrostomy tube placement. CT abdomen without contrast was reviewed by Dr. Serafina Royals, anatomy amendable for percutaneous gastrostomy tube placement with IR.  The procedure is tentatively scheduled for tomorrow pending IR schedule. Made n.p.o. at midnight Ancef ordered CT reviewed by Dr. Dwaine Gale who will be performing the procedure tomorrow, patient Amy need contrast.  As patient's oral intake has been poor, patient will receive contrast via rectum in IR tomorrow.  VSS, afebrile and O2 sat 100% 2L nasal cannula  CBC yesterday showed leukocytosis of 11.9, patient afebrile.  Hgb 8.8, PLT 203 - Concern raised for elevated WBC and new development of right side wheezing in a patient with high risk for aspiration. Discussed with Dr. Dwaine Gale, as patient is not in acute respiratory distress, ok to proceed with G tube placement tomorrow.  INR 0.9 on 03/10/21  Eliquis has been help by primary team.   Patient currently in [place], procedure was discussed with her son and Goddaughter via telephone.  Risks and benefits image guided gastrostomy tube placement was discussed with the patient's family members including, but not limited to the need for a barium enema during the procedure, bleeding, infection, peritonitis and/or damage to adjacent structures.  All of the family members' questions were answered, patient is agreeable to proceed.  Consent signed and in chart.   Thank you for this interesting consult.  I greatly enjoyed meeting CAMRON ESSMAN and look forward to participating in their care.  A copy of this report was sent to the requesting provider on this date.  Electronically Signed: Tera Mater, PA-C 05/28/2021, 2:16 PM   I spent a total of 40 Minutes  in face to face in clinical consultation, greater than 50% of which was counseling/coordinating care for G tube placement.

## 2021-05-28 NOTE — TOC Initial Note (Signed)
Transition of Care [facility]) - Initial/Assessment Note    Patient Details  Name: Amy [name] MRN: [id number] Date of Birth: [birth year]  Transition of Care [facility]) CM/SW Contact:    Sable Feil, LCSW Phone Number: 05/28/2021, 1:23 PM  Clinical Narrative:  Patient came to hospital from [facility], where she was receiving ST rehab. Son, Lauryl Riemersma contacted [phone]) regarding the d/c plan once his mother medically stable for discharge. Son indicated that he is not sure regarding return to [facility] and will get back to [place].                  Expected Discharge Plan: Skilled Nursing Facility Barriers to Discharge: Continued Medical Work up   Patient Goals and CMS Choice Patient states their goals for this hospitalization and ongoing recovery are:: Son agreeable to patient returning to a facility for ongoing therapy CMS Medicare.gov Compare Post Acute Care list provided to:: Other (Comment Required) (Medicare.gov not discussed during telephone conversation on 8/3) Choice offered to / list presented to : Adult Children (Medicare.gov not discussed during phone conversation on 8/3)  Expected Discharge Plan and Services Expected Discharge Plan: [place] In-house Referral: Clinical Social Work     Living arrangements for the past 2 months: [place] (Patient came to hospital from [facility])                                      Prior Living Arrangements/Services Living arrangements for the past 2 months: [place] (Patient came to hospital from [place]) Lives with:: Facility Resident (Came to hospital from [facility], where she was receiving [facility] rehab) Patient language and need for interpreter reviewed:: No Do you feel safe going back to the place where you live?:  (Son undecided as to what SNF patient will discharge to)      Need for Family Participation in Patient Care: Yes (Comment) Care giver  support system in place?: Yes (comment)   Criminal Activity/Legal Involvement Pertinent to Current Situation/Hospitalization: No - Comment as needed  Activities of Daily Living      Permission Sought/Granted Permission sought to share information with : Other (comment) (Contacted son as patient not fully oriented) Permission granted to share information with : No (Patient not fully oriented)              Emotional Assessment Appearance:: Other (Comment Required (as of 8/3, have not visited with patient) Attitude/Demeanor/Rapport:  (Have not assessed) Affect (typically observed):  (Have not assessed) Orientation: : Oriented to Self Alcohol / Substance Use: Tobacco Use, Alcohol Use, Illicit Drugs (Per H&P, patient quit smoking, does not currently drink alcohol and does not currently use marijuana) Psych Involvement: No (comment)  Admission diagnosis:  GI bleed [K92.2] Acute GI bleeding [K92.2] Patient Active Problem List   Diagnosis Date Noted   Cerebral thrombosis with cerebral infarction 05/28/2021   Protein-calorie malnutrition, severe 05/28/2021   Hypokalemia 05/19/2021   GI bleed 05/19/2021   Stage 2 skin ulcer of sacral region ([place]) 05/19/2021   CVA (cerebral vascular accident) ([place]) 04/21/2021   Lower GI bleed 04/21/2021   Gastritis 03/11/2021   Elevated glucose 03/11/2021   Hospital discharge follow-up 02/06/2021   Swelling of face 01/27/2021   Facial swelling 01/27/2021   Neck swelling 01/10/2021   Dyslipidemia 01/09/2021   Angio-edema 01/08/2021   Primary osteoarthritis of right  knee 12/10/2020   Diarrhea, unspecified 07/03/2020   Pain, unspecified 07/03/2020   Unspecified disturbances of skin sensation 07/03/2020   Type 2 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, with long-term current use of insulin ([place]) 05/23/2020   Slow transit constipation 05/09/2020   Drug-induced constipation 05/09/2020   Acidosis 05/01/2020   Disorder of  lipoprotein metabolism, 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   Encounter for adequacy testing for peritoneal dialysis ([place]) 05/01/2020   Liver disease, 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   Other dietary vitamin B12 deficiency anemia 05/01/2020   Other disorders of electrolyte and fluid balance, not elsewhere classified 05/01/2020   Other disorders resulting from impaired renal tubular function 05/01/2020   Other long term (current) drug therapy 05/01/2020   Essential hypertension 01/16/2020   Bilateral carpal tunnel syndrome 01/16/2020   Abnormality of albumin 12/07/2019   Allergy, unspecified, initial encounter 11/27/2019   Mild protein-calorie malnutrition ([place]) 09/25/2019   Iron deficiency anemia, unspecified 09/22/2019   Coagulation defect, unspecified ([place]) 09/19/2019   Dependence on renal dialysis ([place]) 09/19/2019   ESRD on dialysis ([place]) 09/19/2019   Shortness of breath 09/19/2019   Hyperkalemia 07/20/2019   Hypocalcemia [id number]   Metabolic acidosis, increased anion gap 07/20/2019   Nausea and vomiting 07/20/2019   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[place]) 04/11/2017   Diabetic neuropathy ([place]) 04/15/2016   Hematuria 04/15/2016   Proteinuria due to type 2 diabetes mellitus ([place]) 04/15/2016   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 01/29/2016   PCP:  [name], MD Pharmacy:  No Pharmacies Listed    Social Determinants of Health (SDOH) Interventions  No SDOH interventions requested or needed at this time  Readmission Risk Interventions No flowsheet data found.

## 2021-05-28 NOTE — TOC Progression Note (Signed)
Transition of Care [facility]) - Progression Note    Patient Details  Name: Amy [name] MRN: [id number] Date of Birth: Oct 04, 1946  Transition of Care [facility]) CM/SW Contact  Sharlet Salina Mila Homer, LCSW Phone Number: 05/28/2021, 5:24 PM  Clinical Narrative:  Talked with patient's god-daughter Joelene Millin at the bedside regarding patient and her discharge plan. CSW informed that another SNF is desired and this was discussed and the Medicare.gov SNF list provided. Also talked with Joelene Millin regarding Palliative Care and a sheet explaining the differences in Palliative and Hospice care provided. CSW informed that she can be contacted if unable to reach patient's son. CSW informed by Joelene Millin that she lives out-of-town and will be returning home, but can be contacted by phone if needed.    Expected Discharge Plan: [place] Barriers to Discharge: Continued Medical Work up  Expected Discharge Plan and Services Expected Discharge Plan: [place] In-house Referral: Clinical Social Work     Living arrangements for the past 2 months: [place] (Patient came to hospital from SNF [facility])                                     Social Determinants of Health (SDOH) Interventions  No SDOH interventions requested or needed at time.  Readmission Risk Interventions No flowsheet data found.

## 2021-05-28 NOTE — Progress Notes (Signed)
Carotid duplex has been completed.   Preliminary results in CV Proc.   Abram Sander 05/28/2021 11:58 AM

## 2021-05-28 NOTE — Progress Notes (Addendum)
STROKE TEAM PROGRESS NOTE    Interval History   No acute events overnight, patient is sleeping  in bed upon entering the room. She is drowsy and difficulty to rouse to participate in the neurological examination. No family at bedside.  Patient has presented with altered mental status and confusion and delirium without any focal deficits.  MRI scan of the brain personally reviewed shows weak diffusion positivity in the right frontal temporal and parietal regions but this is likely T2 shine through artifact in my opinion.  I have reviewed MRI scan report from May 2022 at outside hospital which also mention this  Pertinent Lab Work and Imaging    05/25/21 CT Head WO IV Contrast Moderate to large chronic cortical/subcortical infarct within the right frontoparietal and temporal occipital lobes, new as compared to the head CT of 01/18/2021.   An infarct is also now present within the right thalamus and adjacent posterior limb of right internal capsule, new as compared to the prior head CT, but age-indeterminate (although favored chronic).   Chronic appearing lacunar infarct within the left thalamus, not definitively present on the prior examination.   Redemonstrated chronic lacunar infarct within the left basal ganglia.   Background moderate chronic small vessel ischemic changes within thecerebral white matter.   Mild-to-moderate generalized cerebral atrophy with comparativelymild cerebellar atrophy.   Large right middle ear/mastoid effusion. A small left mastoid effusion is also present.   Partially imaged right maxillofacial soft tissue swelling nonspecific.  05/27/21 MRA Head WO Contrast  1. Large right MCA territory infarct, as described above. 2. Advanced chronic microvascular ischemic changes of the white matter and moderate parenchymal loss. 3. MR angiogram severely degraded by motion. No intracranial ICA or basilar artery occlusion.  05/28/21 VAS US Carotid  Right Carotid: Velocities  in the right ICA are consistent with a 1-39% stenosis.   Left Carotid: Velocities in the left ICA are consistent with a 1-39% stenosis.   Vertebrals: Bilateral vertebral arteries demonstrate antegrade flow.   05/27/21 MRI Brain WO Contrast  1. Large right MCA territory infarct, as described above. 2. Advanced chronic microvascular ischemic changes of the white matter and moderate parenchymal loss. 3. MR angiogram severely degraded by motion. No intracranial ICA or basilar artery occlusion.   Physical Examination   Constitutional: Frail African American female resting in bed  Cardiovascular: Normal RR Respiratory: No increased WOB   Mental status: Drowsy but rouses with stimulation, does not answer LOC questions but protests and states ow when examiner gives noxious stimulation to extremities. Follows commands to show two fingers and give thumbs up  Speech: Speech is limited, does not participate in naming and repetition  Cranial nerves: Right gaze preference, crosses midline, decreased BTT to the left, left facial weakness, tongue appears midline  Motor: Normal bulk, tone is increased to LUE and LLE. Her LUE is spastic and contracted. LLE also has increased tone. She is unable to participate in strength testing. RUE is antigravity. She gives poor effort when lifting BLE and both drop to the bed.  Has bilateral asterixis. Sensory: Intact to noxious stimulation  Coordination: UTA  Gait: Deferred due to weakness   Assessment and Plan   [name] is a 75 y.o. female w/pmh of ESRD on dialysis, diabetes, hypertension, hyperlipidemia who presents with worsening encephalopathy and confusion.   #Prior R MCA Stroke  #Encephalopathy + Confusion  Patient presented with the symptoms described above. Of note, she was admitted to [facility] in May of this year  and suffered a stroke at that time( developed stroke like symptoms on 03/16/21 and received IVTPA)  that caused residual left sided  deficits.   Per review of imaging in care everywhere in the month of May, MRI Brain showed increased T2/FLAIR signal seen within the region of the right frontal, parietal, and occipital lobes, as well as the insular cortex which demonstrate associated restricted diffusion, worrisome for infarct + multiple additional scattered foci of increased T2/FLAIR signal abnormality are noted throughout the periventricular and deep white matter, which are nonspecific, but commonly seen in the setting of chronic microvascular ischemic disease. Her echo was done on 5/25 with EF > 55 % and LA normal in size. PVL Carotid Duplex right showed no significant stenosis to the right cervical carotid system. EEG 5/25 w/ 2 transient episodes of focal cerebral dysfunction over the right temporal region concerning for transient hypoperfusion.   Her stroke labs done this admission at [facility] show lipid panel w/LDL 68 and Hemoglobin A1C 5.1. MRI Brain findings were comparable to her prior MRI at [facility] with no drastic changes; showed large R MCA stroke + advanced chronic microvascular ischemic changes of the white matter. Vessel imaging was obtained this admission with MRA Head which was degraded by motion, did not show occlusion and bilateral CUS that revealed RICA + LICA with [id number] % stenosis.  Primary team was recommended to not repeat an echo as one was done 3 months ago. Her stroke is cryptogenic, possible cardioembolic.   There is no new stroke that could be contributing to her current sx of encephalopathy and confusion. Labs ordered by neurology include RPR which was negative, and Vitamin B1 is pending.   Suspect her encephalopathy is multifactorial in origin considering GI bleed, ESRD, anemia of chronic disease and electrolyte abnormalities that her primary team has been addressing this admission.   Do recommend enforcing delirium precautions to prevent further confusion/ delirium.  - Delirium precautions: Lights on during the day,  stimulation with television/music to help keep patient awake during daytime, melatonin 5 mg HS at night to help her sleep  - Continue Atorvastatin 40 for stroke prevention  - Primary team to start Aspirin for stroke prevention if appropriate from a GI standpoint given she was admitted with GI bleed - Considering her poor quality of life and palliative care consult ( family has decided on no artifical feeding for now) , will defer on ordering a cardiac event monitor at this time for further stroke work up  - There is no need for an echo as a part of her stroke work up  - Please place ambulatory referral to neurology at discharge if appropriate depending on her clinical course   #Hyperlipidemia From a stroke prevention stand point, the LDL goal is < 70. LDL is at goal at 68, continue Atorvastatin 40 mg QD as it is effectively controlling her LDL.   #DMII  Hemoglobin A1C this admission noted to be 5.1, at goal from a stroke standpoint. Recommend to treat DMII with SSI while hospitalized.    Hospital day # 8  Ruta Hinds, NP  [facility] Nurse Practitioner Patient seen and discussed with attending physician Dr. Carilyn Goodpasture MD NOTE :  I have personally obtained history,examined this patient, reviewed notes, independently viewed imaging studies, participated in medical decision making and plan of care.ROS completed by me personally and pertinent positives fully documented  I have made any additions or clarifications directly to the above note. Agree with note above.  Patient presented with altered mental status and encephalopathy and has history of previous stroke with spastic hemiplegia.  MRI scan personally reviewed shows weak diffusion positive changes in the right frontal temporal parietal lobes which are likely T2 shine through rather than acute infarct in my opinion as these findings were described on previous MRI in May 2022 at an outside hospital as well..  Continue treatment  of encephalopathy as per primary team.  No further stroke work-up is necessary.  Discussed with Dr. Karleen Hampshire.  Stroke team will sign off.  Kindly call for questions.  Greater than 50% time during this 35-minute visit were spent in counseling and coordination of care and answering questions Antony Contras, [place] Pager: [phone] 05/28/2021 4:53 PM   To contact Stroke Continuity provider, please refer to [email or web address] After hours, contact General Neurology

## 2021-05-28 NOTE — Progress Notes (Signed)
Initial Nutrition Assessment  DOCUMENTATION CODES:  Severe malnutrition in context of chronic illness  INTERVENTION:  Start 48-hour calorie count.  Continue diet per SLP.  Assist with feeding patient during meals and with supplements.  Add Nepro Shake po TID, each supplement provides 425 kcal and 19 grams protein.  Add Magic cup TID with meals, each supplement provides 290 kcal and 9 grams of protein.  Add Rena-Vite daily.  NUTRITION DIAGNOSIS:  Severe Malnutrition related to chronic illness (ESRD on HD, prior CVA) as evidenced by percent weight loss, severe muscle depletion, energy intake < or equal to 75% for > or equal to 1 month.  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 7/27 - EGD; flexible sigmoidoscopy  Pt with 0% PO intake for the last 8 meals over the last 3 days. Calorie Count ordered. Pt's family considering PEG placement and artificial feeding. Per MD note, pt's son reports that she has had poor PO for 4 months now.  Admit wt: 58.1 kg Current wt: 58.5 kg Pt's weight remains stable throughout admission. Per Epic, pt has lost ~25 lbs (16.4%) in the last 4 months, which is significant and severe for the time frame.  Exam showed few depletions, but given the amount of edema pt has, fluid is very likely masking most depletions.  Recommend adding Nepro shakes TID, Magic Cup TID, and Rena-Vit daily.  Medications: reviewed; midodrine TID, Remeron, thiamine  Labs: reviewed; CBG 99-169 HbA1c: 5.1% (05/2021)  NUTRITION - FOCUSED PHYSICAL EXAM: Flowsheet Row Most Recent Value  Orbital Region Mild depletion   Upper Arm Region No depletion  Thoracic and Lumbar Region Moderate depletion  Buccal Region No depletion  Temple Region Moderate depletion  Clavicle Bone Region Mild depletion  Clavicle and Acromion Bone Region Mild depletion  Scapular Bone Region Unable to assess  Dorsal Hand Moderate depletion  Patellar Region Severe depletion  Anterior Thigh Region Severe depletion  Posterior Calf Region Severe depletion  Edema (RD Assessment) Moderate  [BUE, BLE, generalized]  Hair Reviewed  Eyes Reviewed  Mouth Reviewed  Skin Reviewed  Nails Reviewed   Diet Order:   Diet Order             DIET DYS 3 Room service appropriate? Yes with Assist; Fluid consistency: Thin  Diet effective now                  EDUCATION NEEDS:  Education needs have been addressed  Skin:  Skin Assessment: Skin Integrity Issues: Skin Integrity Issues:: Stage II Stage II: Pressure Injury on coccyx; Open wound on buttocks  Last BM:  05/25/21 - Type 7, red, small  Height:  Ht Readings from Last 1 Encounters:  05/20/21 '5\' 7"'$  (1.702 m)   Weight:  Wt Readings from Last 1 Encounters:  05/26/21 58.5 kg   BMI:  Body mass index is 20.2 kg/m.  Estimated Nutritional Needs:  Kcal:  2000-2200 Protein:  70-85 grams Fluid:  >2 L  Derrel Nip, RD, LDN (she/her/hers) Registered Dietitian I After-Hours/Weekend Pager # in [place]

## 2021-05-28 NOTE — Progress Notes (Addendum)
[facility] Progress Note   Subjective: Seen in bed, minimally responsive. Moans to pain. HD schedule adjusted to T,Th,S. HD tomorrow on new schedule.   Objective Vitals:   05/27/21 1737 05/27/21 2050 05/28/21 0452 05/28/21 0837  BP: 104/71 104/61 111/67 118/85  Pulse: 99 99 82 85  Resp: '18 18 15 14  '$ Temp: 98 F (36.7 C) 98.9 F (37.2 C) 98.4 F (36.9 C) 98.4 F (36.9 C)  TempSrc: Oral Oral  Oral  SpO2: 98% 100% 100% 100%  Weight:      Height:       Physical Exam General: Chronically ill appearing female in NAD Heart:S1,S2 no M/R/G Lungs: CTAB No WOB Abdomen: NABS Extremities: No LE edema. UE edema with contractures present  Dialysis Access: L AVG +T/B     Additional Objective Labs: Basic Metabolic Panel: Recent Labs  Lab 05/22/21 0321 05/23/21 0733 05/24/21 1256 05/25/21 1241 05/26/21 0334 05/27/21 0531  NA 131* 132* 134*  --  136 136  K 3.9 2.9* 4.1  --  3.5 3.7  CL 96* 95* 97*  --  98 97*  CO2 21* 26 21*  --  27 26  GLUCOSE 127* 121* 96  --  106* 151*  BUN '14 8 10  '$ --  19 12  CREATININE 3.51* 2.49* 3.97*  --  5.62* 3.75*  CALCIUM 7.5* 8.0* 8.1*  --  8.1* 8.3*  PHOS 2.1* 1.0*  --  1.7*  --   --    Liver Function Tests: Recent Labs  Lab 05/22/21 0321 05/23/21 0733 05/27/21 0531  AST  --   --  34  ALT  --   --  26  ALKPHOS  --   --  78  BILITOT  --   --  0.8  PROT  --   --  6.4*  ALBUMIN 2.2* 2.6* 2.4*   No results for input(s): LIPASE, AMYLASE in the last 168 hours. CBC: Recent Labs  Lab 05/22/21 0321 05/23/21 0733 05/24/21 1256 05/26/21 0334 05/27/21 0531  WBC 9.9 6.3 8.9 10.5 11.9*  NEUTROABS  --   --   --  7.8* 9.3*  HGB 8.5* 8.5* 8.8* 8.4* 8.8*  HCT 26.2* 25.8* 29.0* 26.0* 28.1*  MCV 94.2 95.2 99.3 95.9 98.3  PLT 148* 210 209 216 203   Blood Culture    Component Value Date/Time   SDES URINE, CATHETERIZED 05/02/2021 0022   SPECREQUEST NONE 05/02/2021 0022   CULT  05/02/2021 0022    NO GROWTH Performed at  [facility] [address], [place], [place] [address]    REPTSTATUS 05/04/2021 FINAL 05/02/2021 0022    Cardiac Enzymes: No results for input(s): CKTOTAL, CKMB, CKMBINDEX, TROPONINI in the last 168 hours. CBG: Recent Labs  Lab 05/26/21 2054 05/27/21 0647 05/27/21 1122 05/27/21 2051 05/28/21 0645  GLUCAP 143* 142* 169* 113* 99   Iron Studies: No results for input(s): IRON, TIBC, TRANSFERRIN, FERRITIN in the last 72 hours. '@lablastinr3'$ @ Studies/Results: CT ABDOMEN WO CONTRAST  Result Date: 05/27/2021 CLINICAL DATA:  75 year old female with history of dysphagia. Evaluate for gastrostomy tube placement. EXAM: CT ABDOMEN WITHOUT CONTRAST TECHNIQUE: Multidetector CT imaging of the abdomen was performed following the standard protocol without IV contrast. COMPARISON:  06/26/2020 FINDINGS: Lower chest: Right basilar subsegmental consolidative opacity in trace right pleural effusion. The heart is normal in size. No pericardial effusion. Hepatobiliary: Increased conspicuity of a hypoattenuating ovoid region in the anterior aspect of segment 4 measuring approximately 4.9 x 2.2 x  2.1 cm, previously too inconspicuous to measure. The liver is normal in size and contour. The gallbladder is present unremarkable. No intra or extrahepatic biliary ductal dilation. Pancreas: Unremarkable. No pancreatic ductal dilatation or surrounding inflammatory changes. Spleen: Normal in size without focal abnormality. Adrenals/Urinary Tract: Adrenal glands are unremarkable. Kidneys are normal, without renal calculi, focal lesion, or hydronephrosis. Stomach/Bowel: Marginal percutaneous window for gastrostomy access due to overlying left lobe of the liver and transverse colon. The remaining visualized small bowel and colon are within normal limits without significant mural thickening or distension. Vascular/Lymphatic: No significant vascular findings are present. No enlarged abdominal or pelvic lymph nodes. Other: Mild  anasarca. No intra-abdominal fluid collections or evidence abdominal wall hernia. Musculoskeletal: No acute or significant osseous findings. IMPRESSION: 1. Marginal percutaneous window for gastrostomy access, though the stomach is decompressed. 2. Indeterminate ovoid hepatic hypodensity in the anterior aspect of segment 4 measuring up to 4.9 cm. This may represent a mass or focal fatty infiltration. Consider contrast enhanced ultrasound evaluation or MRI abdomen for further evaluation. 3. Consolidative subsegmental opacities in the dependent portion of right lower lobe as could be seen with aspiration pneumonia in the setting of dysphagia. Ruthann Cancer, MD [facility] Electronically Signed   By: Ruthann Cancer MD   On: 05/27/2021 08:15   MR ANGIO HEAD WO CONTRAST  Result Date: 05/27/2021 CLINICAL DATA:  Stroke follow-up. EXAM: MRI HEAD WITHOUT CONTRAST MRA HEAD WITHOUT CONTRAST TECHNIQUE: Multiplanar, multi-echo pulse sequences of the brain and surrounding structures were acquired without intravenous contrast. Angiographic images of the Circle of Willis were acquired using MRA technique without intravenous contrast. COMPARISON:  Head CT May 25, 2021 FINDINGS: The study is severely degraded by motion. MRI HEAD FINDINGS Brain: Area of restricted diffusion involving right frontoparietal, posterior temporal region and lateral aspect of the right occipital lobe, consistent with acute/subacute infarct. Scattered and confluent foci of T2 hyperintensity are seen within the white matter of the cerebral hemispheres, nonspecific, most likely related to chronic small vessel ischemia. Scattered susceptibility artifact in the parietal cortex is suggestive of petechial hemorrhage. Moderate parenchymal volume loss. No hemorrhage, hydrocephalus or mass lesion. Vascular: Normal flow voids. Skull and upper cervical spine: Grossly unremarkable. Sinuses/Orbits: No acute  findings Other: Prominent bilateral mastoid effusion. MRA HEAD FINDINGS Anterior circulation: Flow related enhancement is seen in the bilateral intracranial internal carotid arteries, bilateral A1/ACA and M1/MCA segments. Evaluation of distal branches is precluded by motion artifact. Posterior circulation: Flow related enhancement is seen in the intracranial bilateral vertebral arteries, basilar arteries and bilateral P1/PCA segments. Evaluation of distal branches is precluded by motion artifacts. Anatomic variants: None significant. IMPRESSION: 1. Large right MCA territory infarct, as described above. 2. Advanced chronic microvascular ischemic changes of the white matter and moderate parenchymal loss. 3. MR angiogram severely degraded by motion. No intracranial ICA or basilar artery occlusion. Electronically Signed   By: Pedro Earls M.D.   On: 05/27/2021 09:51   MR BRAIN WO CONTRAST  Result Date: 05/27/2021 CLINICAL DATA:  Stroke follow-up. EXAM: MRI HEAD WITHOUT CONTRAST MRA HEAD WITHOUT CONTRAST TECHNIQUE: Multiplanar, multi-echo pulse sequences of the brain and surrounding structures were acquired without intravenous contrast. Angiographic images of the Circle of Willis were acquired using MRA technique without intravenous contrast. COMPARISON:  Head CT May 25, 2021 FINDINGS: The study is severely degraded by motion. MRI HEAD FINDINGS Brain: Area of restricted diffusion involving right frontoparietal, posterior temporal region and lateral aspect of the right  occipital lobe, consistent with acute/subacute infarct. Scattered and confluent foci of T2 hyperintensity are seen within the white matter of the cerebral hemispheres, nonspecific, most likely related to chronic small vessel ischemia. Scattered susceptibility artifact in the parietal cortex is suggestive of petechial hemorrhage. Moderate parenchymal volume loss. No hemorrhage, hydrocephalus or mass lesion. Vascular: Normal flow voids.  Skull and upper cervical spine: Grossly unremarkable. Sinuses/Orbits: No acute findings Other: Prominent bilateral mastoid effusion. MRA HEAD FINDINGS Anterior circulation: Flow related enhancement is seen in the bilateral intracranial internal carotid arteries, bilateral A1/ACA and M1/MCA segments. Evaluation of distal branches is precluded by motion artifact. Posterior circulation: Flow related enhancement is seen in the intracranial bilateral vertebral arteries, basilar arteries and bilateral P1/PCA segments. Evaluation of distal branches is precluded by motion artifacts. Anatomic variants: None significant. IMPRESSION: 1. Large right MCA territory infarct, as described above. 2. Advanced chronic microvascular ischemic changes of the white matter and moderate parenchymal loss. 3. MR angiogram severely degraded by motion. No intracranial ICA or basilar artery occlusion. Electronically Signed   By: Pedro Earls M.D.   On: 05/27/2021 09:51   EEG adult  Result Date: 05/26/2021 Lora Havens, MD     05/26/2021  4:36 PM Patient Name: Amy [name] MRN: [id number] Epilepsy Attending: Lora Havens Referring Physician/Provider: Dr Hosie Poisson Date: 05/26/2021 Duration: 28.44 mins Patient history: 75 year old female with prior strokes with altered mental status.  EEG to evaluate for seizures. Level of alertness: Awake AEDs during EEG study: GBP Technical aspects: This EEG study was done with scalp electrodes positioned according to the 10-20 International system of electrode placement. Electrical activity was acquired at a sampling rate of '500Hz'$  and reviewed with a high frequency filter of '70Hz'$  and a low frequency filter of '1Hz'$ . EEG data were recorded continuously and digitally stored. Description: This EEG study was done with scalp electrodes positioned according to the 10-20 International system of electrode placement. Electrical activity was acquired at a sampling rate of '500Hz'$  and reviewed  with a high frequency filter of '70Hz'$  and a low frequency filter of '1Hz'$ . EEG data were recorded continuously and digitally stored. Description: EEG showed continuous low amplitude 2 to 3 Hz delta slowing in right hemisphere as well as 5 to 6 Hz theta slowing in left hemisphere. Hyperventilation and photic stimulation were not performed.   ABNORMALITY -Continuous slow, generalized and lateralized right hemisphere IMPRESSION: This study is suggestive of cortical dysfunction arising from right hemisphere likely secondary to underlying structural abnormality/stroke.  There is also moderate diffuse encephalopathy, nonspecific etiology.  No seizures or epileptiform discharges were seen throughout the recording. Priyanka Barbra Sarks   Medications:  sodium chloride     thiamine injection 500 mg (05/28/21 0537)   Followed by   thiamine injection      atorvastatin  40 mg Oral Daily   Chlorhexidine Gluconate Cloth  6 each Topical Q0600   [START ON 05/29/2021] darbepoetin (ARANESP) injection - DIALYSIS  100 mcg Intravenous Q Thu-HD   gabapentin  100 mg Oral QHS   heparin injection (subcutaneous)  5,000 Units Subcutaneous Q8H   midodrine  10 mg Oral TID   mirtazapine  7.5 mg Oral QHS   pantoprazole  40 mg Oral Q0600   sodium chloride flush  3 mL Intravenous Q12H   [START ON 06/03/2021] thiamine injection  100 mg Intravenous Daily   venlafaxine XR  75 mg Oral Q breakfast     Dialysis Orders:  AF MWF 3h 85mn 400/500  62.5kg    3K/2.5 Ca    P2  AVG HeRO   Hep none - Mircera 50 q 2 wks  (7/13)  Venofer 50 q wk - Parsabiv '5mg'$  IV q HD   Assessment/Plan: Melena/Rectal bleeding - GI consulted. Hx rectal ulcers.  EGD 7/27 normal and flex sig with mucosal ulceration c/w sterocoral ulcers, non bleeding.  hgb low but stable AMS: Palliative care consulted to establish goals of care. Pt is now DNR, no artificial feeds, no colonscopy for now. PCT will reassess and meet w/ family again today. Patient's mental status is  very poor, she is hallucinating regularly. She has not made any improvement since admission.  Ammonia level elevated. Clearly her QOL is not being enhanced by dialysis ESRD: Changed to T,Th,S Next HD 05/29/2021. Had been attempting HD in chair but truthfully mental status has not been stable enough to attempt this right now . Discussions about benefits vs con's of aggressive Rx (e.g., HD) for this patient are in progress w/ PCT.  Next HD Today.  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  Hypertension/volume  - BP soft. On midodrine for BP support.  Edema improving, UF as tolerated.  Under edw if weights correct. Does not seem to be overloaded Anemia  - Hgb trending down, last 8.5. on ESA, weekly Fe. Change Aranesp to139mg IV q Thursday. Metabolic bone disease -  Corr Ca and phos ok.  Continue to monitor. No binders currently  Nutrition: Poorly eating per notes.Albumin low. Continue protein supps.  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   Rita H. Brown NP-C 05/28/2021, 9:01 AM  [facility] [phone] Patient seen and examined, agree with above note with above modifications. No meaningful change in status unfortunately.  I honestly feel like she is not stable enough to do OP HD in a chair and be able to follow verbal cues from the dialysis staff-  there is concern of needles getting dislodged that would be a major safety issue-  would cont [place]  KCorliss Parish MD 05/28/2021

## 2021-05-28 NOTE — Progress Notes (Signed)
PROGRESS NOTE    Amy [name]  [id number] DOB: 10-30-1945 DOA: 05/19/2021 PCP: [name], MD    Chief Complaint  Patient presents with   Rectal Bleeding    Brief Narrative:    Amy [name] is a 75 y.o. 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 Pt underwent EGD which was wnl and sigmoidoscopy showing stercoral ulcers and not a good prep. GI recommended colonoscopy for further evaluation but she would need NG TUBE for the prep. In view of her multiple co morbidities, recurrent admissions, palliative care consulted for goals of care discussions. In view of clinical decline int he last few months, we ordered a CT head and MRI brain without contrast for further evaluation. She was found to have Large right MCA territory infarct. Advanced chronic microvascular ischemic changes of the white matter and moderate parenchymal loss. MR angiogram severely degraded by motion. No intracranial ICA or basilar artery occlusion. As per nephrology she is not a candidate for OP HD given confusion and weakness and overall HD is not enhancing her QOL.  Pt seen at bedside, she is somnolent. RN reports that they tried to get her breakfast but she didn't eat any.  Currently waiting for IR for PEG placement.     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  Hemoccult positive GI consulted and  she underwent , EGD wnl, and flex sigmoidoscopy, showing Mucosal ulceration c/w Stercoral ulcers. Non bleeding. she will need colonoscopy.  In view of her advanced age, multiple co morbidities, multiple admissions for the rectal bleeding , palliative care consulted and family has requested no artificial feeding, no colonoscopy for now. Pt continued to decline, with minimal oral intake.  Transfuse to keep hemoglobin greater than 7. Hemoglobin remains stable around 8.8. no bleeding since admission.  As per gastroenterology patient can be restarted on Eliquis. Will hold eliquis until after the PEG placement.    End-stage renal disease on dialysis As per nephrology she is not a candidate for OP HD given confusion and weakness and overall HD is not enhancing her QOL. Nephrology consulted and HD as per nehrology. She will not be able to sit in the chair for OP HD .    Hypomagnesemia and hypokalemia  Replaced.    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    CVA with left hemiparesis.  Continue with lipitor. Neurology on board, it appears pt had chronic strokes. US carotids ordered.  Hemoglobin A1c is 5.1 Lipid panel reviewed. LDL is 68   Type 2 diabetes mellitus with end-stage renal disease on chronic dialysis, insulin-dependent Last A1c 6.9 Diet controlled Continue with sliding scale insulin for now CBG (last 3)  Recent Labs    05/27/21 2051 05/28/21 0645 05/28/21 1136  GLUCAP 113*  99 102*   Minimal oral intake, continue with sliding scale insulin. SLP  evaluation recommended advancing diet to dysphagia 3 diet so she can have more options.  Unfortunately her intake has been minimal.    Hyperlipidemia Continue with statin    Hypotension  Resolved, on midodrine.   Stage 2 Pressure injury present on admission.  Pressure Injury 05/20/21 Coccyx Mid Stage 2 -  Partial  thickness loss of dermis presenting as a shallow open injury with a red, pink wound bed without slough. 1X0.5X0 (Active)  05/20/21 1315  Location: Coccyx  Location Orientation: Mid  Staging: Stage 2 -  Partial thickness loss of dermis presenting as a shallow open injury with a red, pink wound bed without slough.  Wound Description (Comments): 1X0.5X0  Present on Admission: Yes  Wound care .    Acute metabolic encephalopathy/ deconditioning, failure to thrive:  As per ED notes/ SNF pt mental status is at baseline on admission.  We will send work up for evaluation of UTI.  CT head without contrast shows multiple strokes when compared to the CT done in March 2022.  Discussed the results of the CT with the patient's son, who said he is aware of the patient's previous strokes EEG ordered showed cortical dysfunction arising from right hemisphere secondary to underlying structural abnormality/stroke.  Moderate diffuse encephalopathy of nonspecific etiology.  No seizures or epileptiform discharges were seen throughout the recording. Ammonia level elevated, lactulose ordered patient has poor oral intake in the last 4 months.  More so in the last few weeks TSH wnl, vitamin b level is pending.  vitamin B12 and folate within normal limits. Neurology on board.     Failure to thrive  As per the patient's son she has not been eating well for the last 4 months he would like a PEG placement at this time IR consulted for PEG placement. CT abd without contrast done and it shows Marginal percutaneous window for gastrostomy access, though the stomach is decompressed. Plan for PEG placement.    Incidental Indeterminate ovoid hepatic hypodensity in the anterior aspect of segment 4 measuring up to 4.9 cm. This may represent a mass or focal fatty infiltration.  Recommend outpatient follow up with an MRI of the abdomen/ liver for further evaluation.    Hypokalemia and hypophosphatemia:  Potassium and  phosphorus improved on repeat labs    DVT prophylaxis: Heparin.  Code Status: DNR Family Communication: (none at bedside. )  Called her son and updated on 05/26/21 Disposition:   Status is: Inpatient.   The patient will require care spanning > 2 midnights and should be moved to inpatient because: Ongoing diagnostic testing needed not appropriate for outpatient work up, Unsafe d/c plan, and IV treatments appropriate due to intensity of illness or inability to take PO  Dispo: The patient is from: SNF              Anticipated d/c is to: SNF              Patient currently is medically stable to d/c.   Difficult to place patient No       Consultants:  Gastroenterology Nephrology.   Procedures: EGD,  Antimicrobials: none.    Subjective:  Shei s  somnolent today. Poor oral intake.  Objective: Vitals:   05/27/21 1737 05/27/21 2050 05/28/21 0452 05/28/21 0837  BP: 104/71 104/61 111/67 118/85  Pulse: 99 99 82 85  Resp: '18 18 15 14  '$ Temp: 98 F (36.7 C) 98.9  F (37.2 C) 98.4 F (36.9 C) 98.4 F (36.9 C)  TempSrc: Oral Oral  Oral  SpO2: 98% 100% 100% 100%  Weight:      Height:        Intake/Output Summary (Last 24 hours) at 05/28/2021 1501 Last data filed at 05/28/2021 0800 Gross per 24 hour  Intake 200 ml  Output 0 ml  Net 200 ml    Filed Weights   05/26/21 0850 05/26/21 1222 05/26/21 2054  Weight: 58 kg 58.5 kg 58.5 kg    Examination:  General exam: Elderly woman, chronically ill appearing, somnolent. Not in distress. Respiratory system: air entry fair. On oxygen.  Cardiovascular system: RRR, no JVD, no pedal edema.  Gastrointestinal system: Abdomen is soft, bowel sounds wnl.  Central nervous system: somnolent. Opens eyes briefly, but goes back to sleep. Left hemiparesis.  Extremities: no leg edema. Skin: Stage II sacral pressure ulcer Psychiatry: cannot be assessed.     Data Reviewed: I have personally reviewed following labs and imaging  studies  CBC: Recent Labs  Lab 05/22/21 0321 05/23/21 0733 05/24/21 1256 05/26/21 0334 05/27/21 0531  WBC 9.9 6.3 8.9 10.5 11.9*  NEUTROABS  --   --   --  7.8* 9.3*  HGB 8.5* 8.5* 8.8* 8.4* 8.8*  HCT 26.2* 25.8* 29.0* 26.0* 28.1*  MCV 94.2 95.2 99.3 95.9 98.3  PLT 148* 210 209 216 203     Basic Metabolic Panel: Recent Labs  Lab 05/21/21 1700 05/21/21 2145 05/22/21 0321 05/23/21 0733 05/24/21 1256 05/25/21 1241 05/26/21 0334 05/27/21 0531  NA 130* 133* 131* 132* 134*  --  136 136  K 2.7* 4.3 3.9 2.9* 4.1  --  3.5 3.7  CL 94* 97* 96* 95* 97*  --  98 97*  CO2 24 21* 21* 26 21*  --  27 26  GLUCOSE 121* 111* 127* 121* 96  --  106* 151*  BUN '21 12 14 8 10  '$ --  19 12  CREATININE 4.77* 3.18* 3.51* 2.49* 3.97*  --  5.62* 3.75*  CALCIUM 7.6* 7.6* 7.5* 8.0* 8.1*  --  8.1* 8.3*  MG  --   --  1.9  --   --   --   --   --   PHOS 2.0* 2.4* 2.1* 1.0*  --  1.7*  --   --      GFR: Estimated Creatinine Clearance: 12.3 mL/min (A) (by C-G formula based on SCr of 3.75 mg/dL (H)).  Liver Function Tests: Recent Labs  Lab 05/21/21 1700 05/21/21 2145 05/22/21 0321 05/23/21 0733 05/27/21 0531  AST  --   --   --   --  34  ALT  --   --   --   --  26  ALKPHOS  --   --   --   --  78  BILITOT  --   --   --   --  0.8  PROT  --   --   --   --  6.4*  ALBUMIN 2.2* 2.2* 2.2* 2.6* 2.4*     CBG: Recent Labs  Lab 05/27/21 0647 05/27/21 1122 05/27/21 2051 05/28/21 0645 05/28/21 1136  GLUCAP 142* 169* 113* 99 102*      Recent Results (from the past 240 hour(s))  Resp Panel by RT-PCR (Flu A&B, Covid) Nasopharyngeal Swab     Status: None   Collection Time: 05/19/21 12:15 PM   Specimen: Nasopharyngeal Swab; Nasopharyngeal(NP) swabs in vial transport medium  Result Value Ref  Range Status   SARS Coronavirus 2 by RT PCR NEGATIVE NEGATIVE Final    Comment: (NOTE) SARS-CoV-2 target nucleic acids are NOT DETECTED.  The SARS-CoV-2 RNA is generally detectable in upper  respiratory specimens during the acute phase of infection. The lowest concentration of SARS-CoV-2 viral copies this assay can detect is 138 copies/mL. A negative result does not preclude SARS-Cov-2 infection and should not be used as the sole basis for treatment or other patient management decisions. A negative result may occur with  improper specimen collection/handling, submission of specimen other than nasopharyngeal swab, presence of viral mutation(s) within the areas targeted by this assay, and inadequate number of viral copies(<138 copies/mL). A negative result must be combined with clinical observations, patient history, and epidemiological information. The expected result is Negative.  Fact Sheet for Patients:  EntrepreneurPulse.com.au  Fact Sheet for Healthcare Providers:  IncredibleEmployment.be  This test is no t yet approved or cleared by the [place] FDA and  has been authorized for detection and/or diagnosis of SARS-CoV-2 by FDA under an Emergency Use Authorization (EUA). This EUA will remain  in effect (meaning this test can be used) for the duration of the COVID-19 declaration under Section 564(b)(1) of the Act, 21 U.S.C.section 360bbb-3(b)(1), unless the authorization is terminated  or revoked sooner.       Influenza A by PCR NEGATIVE NEGATIVE Final   Influenza B by PCR NEGATIVE NEGATIVE Final    Comment: (NOTE) The Xpert Xpress SARS-CoV-2/FLU/RSV plus assay is intended as an aid in the diagnosis of influenza from Nasopharyngeal swab specimens and should not be used as a sole basis for treatment. Nasal washings and aspirates are unacceptable for Xpert Xpress SARS-CoV-2/FLU/RSV testing.  Fact Sheet for Patients: EntrepreneurPulse.com.au  Fact Sheet for Healthcare Providers: IncredibleEmployment.be  This test is not yet approved or cleared by the [place] FDA and has been  authorized for detection and/or diagnosis of SARS-CoV-2 by FDA under an Emergency Use Authorization (EUA). This EUA will remain in effect (meaning this test can be used) for the duration of the COVID-19 declaration under Section 564(b)(1) of the Act, 21 U.S.C. section 360bbb-3(b)(1), unless the authorization is terminated or revoked.  Performed at [facility], [place] [address], [place], [place] [address]           Radiology Studies: CT ABDOMEN WO CONTRAST  Result Date: 05/27/2021 CLINICAL DATA:  75 year old female with history of dysphagia. Evaluate for gastrostomy tube placement. EXAM: CT ABDOMEN WITHOUT CONTRAST TECHNIQUE: Multidetector CT imaging of the abdomen was performed following the standard protocol without IV contrast. COMPARISON:  06/26/2020 FINDINGS: Lower chest: Right basilar subsegmental consolidative opacity in trace right pleural effusion. The heart is normal in size. No pericardial effusion. Hepatobiliary: Increased conspicuity of a hypoattenuating ovoid region in the anterior aspect of segment 4 measuring approximately 4.9 x 2.2 x 2.1 cm, previously too inconspicuous to measure. The liver is normal in size and contour. The gallbladder is present unremarkable. No intra or extrahepatic biliary ductal dilation. Pancreas: Unremarkable. No pancreatic ductal dilatation or surrounding inflammatory changes. Spleen: Normal in size without focal abnormality. Adrenals/Urinary Tract: Adrenal glands are unremarkable. Kidneys are normal, without renal calculi, focal lesion, or hydronephrosis. Stomach/Bowel: Marginal percutaneous window for gastrostomy access due to overlying left lobe of the liver and transverse colon. The remaining visualized small bowel and colon are within normal limits without significant mural thickening or distension. Vascular/Lymphatic: No significant vascular findings are present. No enlarged abdominal or pelvic lymph nodes. Other: Mild anasarca.  No  intra-abdominal fluid collections or evidence abdominal wall hernia. Musculoskeletal: No acute or significant osseous findings. IMPRESSION: 1. Marginal percutaneous window for gastrostomy access, though the stomach is decompressed. 2. Indeterminate ovoid hepatic hypodensity in the anterior aspect of segment 4 measuring up to 4.9 cm. This may represent a mass or focal fatty infiltration. Consider contrast enhanced ultrasound evaluation or MRI abdomen for further evaluation. 3. Consolidative subsegmental opacities in the dependent portion of right lower lobe as could be seen with aspiration pneumonia in the setting of dysphagia. Ruthann Cancer, MD [facility] Electronically Signed   By: Ruthann Cancer MD   On: 05/27/2021 08:15   MR ANGIO HEAD WO CONTRAST  Result Date: 05/27/2021 CLINICAL DATA:  Stroke follow-up. EXAM: MRI HEAD WITHOUT CONTRAST MRA HEAD WITHOUT CONTRAST TECHNIQUE: Multiplanar, multi-echo pulse sequences of the brain and surrounding structures were acquired without intravenous contrast. Angiographic images of the Circle of Willis were acquired using MRA technique without intravenous contrast. COMPARISON:  Head CT May 25, 2021 FINDINGS: The study is severely degraded by motion. MRI HEAD FINDINGS Brain: Area of restricted diffusion involving right frontoparietal, posterior temporal region and lateral aspect of the right occipital lobe, consistent with acute/subacute infarct. Scattered and confluent foci of T2 hyperintensity are seen within the white matter of the cerebral hemispheres, nonspecific, most likely related to chronic small vessel ischemia. Scattered susceptibility artifact in the parietal cortex is suggestive of petechial hemorrhage. Moderate parenchymal volume loss. No hemorrhage, hydrocephalus or mass lesion. Vascular: Normal flow voids. Skull and upper cervical spine: Grossly unremarkable. Sinuses/Orbits: No acute findings Other:  Prominent bilateral mastoid effusion. MRA HEAD FINDINGS Anterior circulation: Flow related enhancement is seen in the bilateral intracranial internal carotid arteries, bilateral A1/ACA and M1/MCA segments. Evaluation of distal branches is precluded by motion artifact. Posterior circulation: Flow related enhancement is seen in the intracranial bilateral vertebral arteries, basilar arteries and bilateral P1/PCA segments. Evaluation of distal branches is precluded by motion artifacts. Anatomic variants: None significant. IMPRESSION: 1. Large right MCA territory infarct, as described above. 2. Advanced chronic microvascular ischemic changes of the white matter and moderate parenchymal loss. 3. MR angiogram severely degraded by motion. No intracranial ICA or basilar artery occlusion. Electronically Signed   By: Pedro Earls M.D.   On: 05/27/2021 09:51   MR BRAIN WO CONTRAST  Result Date: 05/27/2021 CLINICAL DATA:  Stroke follow-up. EXAM: MRI HEAD WITHOUT CONTRAST MRA HEAD WITHOUT CONTRAST TECHNIQUE: Multiplanar, multi-echo pulse sequences of the brain and surrounding structures were acquired without intravenous contrast. Angiographic images of the Circle of Willis were acquired using MRA technique without intravenous contrast. COMPARISON:  Head CT May 25, 2021 FINDINGS: The study is severely degraded by motion. MRI HEAD FINDINGS Brain: Area of restricted diffusion involving right frontoparietal, posterior temporal region and lateral aspect of the right occipital lobe, consistent with acute/subacute infarct. Scattered and confluent foci of T2 hyperintensity are seen within the white matter of the cerebral hemispheres, nonspecific, most likely related to chronic small vessel ischemia. Scattered susceptibility artifact in the parietal cortex is suggestive of petechial hemorrhage. Moderate parenchymal volume loss. No hemorrhage, hydrocephalus or mass lesion. Vascular: Normal flow voids. Skull and upper  cervical spine: Grossly unremarkable. Sinuses/Orbits: No acute findings Other: Prominent bilateral mastoid effusion. MRA HEAD FINDINGS Anterior circulation: Flow related enhancement is seen in the bilateral intracranial internal carotid arteries, bilateral A1/ACA and M1/MCA segments. Evaluation of distal branches is precluded by motion artifact. Posterior circulation: Flow related enhancement is  seen in the intracranial bilateral vertebral arteries, basilar arteries and bilateral P1/PCA segments. Evaluation of distal branches is precluded by motion artifacts. Anatomic variants: None significant. IMPRESSION: 1. Large right MCA territory infarct, as described above. 2. Advanced chronic microvascular ischemic changes of the white matter and moderate parenchymal loss. 3. MR angiogram severely degraded by motion. No intracranial ICA or basilar artery occlusion. Electronically Signed   By: Pedro Earls M.D.   On: 05/27/2021 09:51   EEG adult  Result Date: 05/26/2021 Lora Havens, MD     05/26/2021  4:36 PM Patient Name: SHAGUANA CHAI MRN: [id number] Epilepsy Attending: Lora Havens Referring Physician/Provider: Dr Hosie Poisson Date: 05/26/2021 Duration: 28.44 mins Patient history: 75 year old female with prior strokes with altered mental status.  EEG to evaluate for seizures. Level of alertness: Awake AEDs during EEG study: GBP Technical aspects: This EEG study was done with scalp electrodes positioned according to the 10-20 International system of electrode placement. Electrical activity was acquired at a sampling rate of '500Hz'$  and reviewed with a high frequency filter of '70Hz'$  and a low frequency filter of '1Hz'$ . EEG data were recorded continuously and digitally stored. Description: This EEG study was done with scalp electrodes positioned according to the 10-20 International system of electrode placement. Electrical activity was acquired at a sampling rate of '500Hz'$  and reviewed with a high  frequency filter of '70Hz'$  and a low frequency filter of '1Hz'$ . EEG data were recorded continuously and digitally stored. Description: EEG showed continuous low amplitude 2 to 3 Hz delta slowing in right hemisphere as well as 5 to 6 Hz theta slowing in left hemisphere. Hyperventilation and photic stimulation were not performed.   ABNORMALITY -Continuous slow, generalized and lateralized right hemisphere IMPRESSION: This study is suggestive of cortical dysfunction arising from right hemisphere likely secondary to underlying structural abnormality/stroke.  There is also moderate diffuse encephalopathy, nonspecific etiology.  No seizures or epileptiform discharges were seen throughout the recording. Priyanka Barbra Sarks   VAS US CAROTID  Result Date: 05/28/2021 Carotid Arterial Duplex Study Patient Name:  BEATRIS COFER Kopecky  Date of Exam:   05/28/2021 Medical Rec #: [id number]         Accession #:    [id number] Date of Birth: 1946/01/12         Patient Gender: F Patient Age:   75Y Exam Location:  [facility] Procedure:      VAS US CAROTID Referring Phys: [id number] [place] --------------------------------------------------------------------------------  Indications:       CVA. Risk Factors:      Hypertension, hyperlipidemia, Diabetes. Comparison Study:  no prior Performing Technologist: Archie Patten RVS  Examination Guidelines: A complete evaluation includes B-mode imaging, spectral Doppler, color Doppler, and power Doppler as needed of all accessible portions of each vessel. Bilateral testing is considered an integral part of a complete examination. Limited examinations for reoccurring indications may be performed as noted.  Right Carotid Findings: +----------+--------+--------+--------+------------------+--------+           PSV cm/sEDV cm/sStenosisPlaque DescriptionComments +----------+--------+--------+--------+------------------+--------+ CCA Prox  83      8               heterogenous                +----------+--------+--------+--------+------------------+--------+ CCA Distal62      7               heterogenous               +----------+--------+--------+--------+------------------+--------+  ICA Prox  80      13      1-39%   heterogenous               +----------+--------+--------+--------+------------------+--------+ ICA Distal71      16                                         +----------+--------+--------+--------+------------------+--------+ ECA       94                                                 +----------+--------+--------+--------+------------------+--------+ +----------+--------+-------+--------+-------------------+           PSV cm/sEDV cmsDescribeArm Pressure (mmHG) +----------+--------+-------+--------+-------------------+ [id number]                                         +----------+--------+-------+--------+-------------------+ +---------+--------+--+--------+-+---------+ VertebralPSV cm/s61EDV cm/s7Antegrade +---------+--------+--+--------+-+---------+  Left Carotid Findings: +----------+--------+--------+--------+------------------+--------+           PSV cm/sEDV cm/sStenosisPlaque DescriptionComments +----------+--------+--------+--------+------------------+--------+ CCA Prox  73      8               heterogenous               +----------+--------+--------+--------+------------------+--------+ CCA Distal49      11              heterogenous               +----------+--------+--------+--------+------------------+--------+ ICA Prox  68      16      1-39%   heterogenous               +----------+--------+--------+--------+------------------+--------+ ICA Distal106     21                                         +----------+--------+--------+--------+------------------+--------+ ECA       35                                                  +----------+--------+--------+--------+------------------+--------+ +----------+--------+--------+--------+-------------------+           PSV cm/sEDV cm/sDescribeArm Pressure (mmHG) +----------+--------+--------+--------+-------------------+ Subclavian                fistula                     +----------+--------+--------+--------+-------------------+ +---------+--------+--+--------+-+---------+ VertebralPSV cm/s52EDV cm/s9Antegrade +---------+--------+--+--------+-+---------+   Summary: Right Carotid: Velocities in the right ICA are consistent with a 1-39% stenosis. Left Carotid: Velocities in the left ICA are consistent with a 1-39% stenosis. Vertebrals: Bilateral vertebral arteries demonstrate antegrade flow. *See table(s) above for measurements and observations.  Electronically signed by Antony Contras MD on 05/28/2021 at 1:04:48 PM.    Final         Scheduled Meds:  (feeding supplement) PROSource Plus  30 mL Oral BID BM   atorvastatin  40 mg Oral Daily   Chlorhexidine Gluconate Cloth  6 each Topical Q0600   [  START ON 05/29/2021] darbepoetin (ARANESP) injection - DIALYSIS  100 mcg Intravenous Q Thu-HD   feeding supplement (NEPRO CARB STEADY)  237 mL Oral TID BM   gabapentin  100 mg Oral QHS   heparin injection (subcutaneous)  5,000 Units Subcutaneous Q8H   midodrine  10 mg Oral TID   mirtazapine  7.5 mg Oral QHS   multivitamin  1 tablet Oral QHS   pantoprazole  40 mg Oral Q0600   sodium chloride flush  3 mL Intravenous Q12H   [START ON 06/03/2021] thiamine injection  100 mg Intravenous Daily   venlafaxine XR  75 mg Oral Q breakfast   Continuous Infusions:  sodium chloride     thiamine injection 250 mg (05/28/21 1443)     LOS: 8 days        Hosie Poisson, MD [facility]   To contact the attending provider between 7A-7P or the covering provider during after hours 7P-7A, please log into the web site [email or web address] and access using universal [place]  password for that web site. If you do not have the password, please call the hospital operator.  05/28/2021, 3:01 PM

## 2021-05-29 ENCOUNTER — Inpatient Hospital Stay (HOSPITAL_COMMUNITY): Payer: Medicare Other

## 2021-05-29 DIAGNOSIS — R627 Adult failure to thrive: Secondary | ICD-10-CM | POA: Diagnosis not present

## 2021-05-29 DIAGNOSIS — Z515 Encounter for palliative care: Secondary | ICD-10-CM | POA: Diagnosis not present

## 2021-05-29 DIAGNOSIS — E43 Unspecified severe protein-calorie malnutrition: Secondary | ICD-10-CM

## 2021-05-29 DIAGNOSIS — K922 Gastrointestinal hemorrhage, unspecified: Secondary | ICD-10-CM | POA: Diagnosis not present

## 2021-05-29 DIAGNOSIS — E785 Hyperlipidemia, unspecified: Secondary | ICD-10-CM | POA: Diagnosis not present

## 2021-05-29 DIAGNOSIS — D638 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: Secondary | ICD-10-CM | POA: Diagnosis not present

## 2021-05-29 DIAGNOSIS — N186 End stage renal disease: Secondary | ICD-10-CM | POA: Diagnosis not present

## 2021-05-29 HISTORY — PX: IR GASTROSTOMY TUBE MOD SED: IMG625

## 2021-05-29 LAB — BASIC METABOLIC PANEL
Anion gap: 10 (ref 5–15)
BUN: 25 mg/dL — ABNORMAL HIGH (ref 8–23)
CO2: 26 mmol/L (ref 22–32)
Calcium: 8 mg/dL — ABNORMAL LOW (ref 8.9–10.3)
Chloride: 100 mmol/L (ref 98–111)
Creatinine, Ser: 5.58 mg/dL — ABNORMAL HIGH (ref 0.44–1.00)
GFR, Estimated: 8 mL/min — ABNORMAL LOW (ref >60)
Glucose, Bld: 166 mg/dL — ABNORMAL HIGH (ref 70–99)
Potassium: 4 mmol/L (ref 3.5–5.1)
Sodium: 136 mmol/L (ref 135–145)

## 2021-05-29 LAB — GLUCOSE, CAPILLARY
Glucose-Capillary: 158 mg/dL — ABNORMAL HIGH (ref 70–99)
Glucose-Capillary: 127 mg/dL — ABNORMAL HIGH (ref 70–99)
Glucose-Capillary: 118 mg/dL — ABNORMAL HIGH (ref 70–99)

## 2021-05-29 LAB — CBC
HCT: 26.6 % — ABNORMAL LOW (ref 36.0–46.0)
Hemoglobin: 8.5 g/dL — ABNORMAL LOW (ref 12.0–15.0)
MCH: 31.3 pg (ref 26.0–34.0)
MCHC: 32 g/dL (ref 30.0–36.0)
MCV: 97.8 fL (ref 80.0–100.0)
Platelets: 228 10*3/uL (ref 150–400)
RBC: 2.72 MIL/uL — ABNORMAL LOW (ref 3.87–5.11)
RDW: 20 % — ABNORMAL HIGH (ref 11.5–15.5)
WBC: 14.3 10*3/uL — ABNORMAL HIGH (ref 4.0–10.5)
nRBC: 0 % (ref 0.0–0.2)

## 2021-05-29 LAB — HEPATITIS B SURFACE ANTIGEN: Hepatitis B Surface Ag: NONREACTIVE

## 2021-05-29 LAB — VITAMIN B1: Vitamin B1 (Thiamine): 170.6 nmol/L (ref 66.5–200.0)

## 2021-05-29 MED ORDER — MIDAZOLAM HCL 2 MG/2ML IJ SOLN
INTRAMUSCULAR | Status: AC | PRN
  Administered 2021-05-29: 0.5 mg via INTRAVENOUS
  Administered 2021-05-29: 1 mg via INTRAVENOUS
  Administered 2021-05-29: 0.5 mg via INTRAVENOUS

## 2021-05-29 MED ORDER — LIDOCAINE HCL 1 % IJ SOLN
INTRAMUSCULAR | Status: AC
  Filled 2021-05-29: qty 20

## 2021-05-29 MED ORDER — MIDAZOLAM HCL 2 MG/2ML IJ SOLN
INTRAMUSCULAR | Status: AC
  Filled 2021-05-29: qty 2

## 2021-05-29 MED ORDER — LIDOCAINE HCL (PF) 1 % IJ SOLN
INTRAMUSCULAR | Status: DC | PRN
  Administered 2021-05-29: 5 mL

## 2021-05-29 MED ORDER — CEFAZOLIN SODIUM-DEXTROSE 2-4 GM/100ML-% IV SOLN
INTRAVENOUS | Status: AC
  Filled 2021-05-29: qty 100

## 2021-05-29 MED ORDER — FENTANYL CITRATE (PF) 100 MCG/2ML IJ SOLN
INTRAMUSCULAR | Status: AC
  Filled 2021-05-29: qty 2

## 2021-05-29 MED ORDER — GLUCAGON HCL RDNA (DIAGNOSTIC) 1 MG IJ SOLR
INTRAMUSCULAR | Status: AC
  Administered 2021-05-29: 1 mg
  Filled 2021-05-29: qty 1

## 2021-05-29 MED ORDER — FENTANYL CITRATE (PF) 100 MCG/2ML IJ SOLN
INTRAMUSCULAR | Status: DC | PRN
  Administered 2021-05-29 (×2): 25 ug via INTRAVENOUS

## 2021-05-29 NOTE — Procedures (Signed)
Patient was seen on dialysis and the procedure was supervised.  BFR 150-  just starting  Via AVG BP is  125/67.   Patient is confused  Amy [name] 05/29/2021

## 2021-05-29 NOTE — Progress Notes (Signed)
  Echocardiogram 2D Echocardiogram has been attempted. Patient still OTF since AM. Will reattempt at later date.  Randa Lynn Lucie Friedlander 05/29/2021, 3:19 PM

## 2021-05-29 NOTE — Evaluation (Signed)
Occupational Therapy Evaluation Patient Details Name: Amy [name] MRN: [id number] DOB: 05/24/1946 Today's Date: 05/29/2021    History of Present Illness Pt is a 75 y.o. female admitted from short-term rehab at SNF on 05/19/21 with dark stool and fecal occult. S/p EGD and sigmoidoscopy which showed stercoral ulcers. Plan for PEG tube placement 8/4. Pt with decline in the past few months with failure to thrive, acute metabolic encephalopathy. 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 PMH includes ESRD (on HD), HTN, CVA (residual L-side hemiparesis).   Clinical Impression   Pt was in bed and was seen with Physical Therapist. Pt at this time requires total care for all ADLS and repositioning in bed.  Pt in session often hallucinating as reporting a cat was on their shoulder area.  Pt required total assist for repositioning in session for all extremities, trunk and cervical . At this time Occupational Therapy signing off.     Follow Up Recommendations  SNF;Supervision/Assistance - 24 hour    Equipment Recommendations       Recommendations for Other Services       Precautions / Restrictions Precautions Precautions: Fall;Other (comment) Precaution Comments: bladder/bowel incontinence; L-side hemiplegia, L-side inattention; hallucinating Restrictions Weight Bearing Restrictions: No      Mobility Bed Mobility Overal bed mobility: Needs Assistance Bed Mobility: Rolling Rolling: Total assist         General bed mobility comments: Pt resistant to mobility, suspect related to pain/stiffness and fear; totalA to roll R/L for pericare and linen change, eventually relaxing into L sidelying and able to maintain once positioned without assist    Transfers                 General transfer comment: Will require maximove lift transfer OOB    Balance                                           ADL either performed or assessed with clinical judgement   ADL  Overall ADL's : Needs assistance/impaired Eating/Feeding: Total assistance   Grooming: Total assistance   Upper Body Bathing: Total assistance   Lower Body Bathing: Total assistance   Upper Body Dressing : Total assistance   Lower Body Dressing: Total assistance   Toilet Transfer: Total assistance   Toileting- Clothing Manipulation and Hygiene: Total assistance   Tub/ Shower Transfer: Total assistance   Functional mobility during ADLs: Total assistance       Vision Patient Visual Report: Other (comment) (Pt only looking to R side)       Perception     Praxis      Pertinent Vitals/Pain Pain Assessment: Faces Faces Pain Scale: Hurts even more Pain Location: Grimacing/moaning/guarding with BUE/BLE/cervical ROM and repositioning Pain Descriptors / Indicators: Grimacing;Guarding Pain Intervention(s): Limited activity within patient's tolerance     Hand Dominance     Extremity/Trunk Assessment Upper Extremity Assessment Upper Extremity Assessment: LUE deficits/detail;RUE deficits/detail RUE Deficits / Details: Functionally <3/5 throughout, active movement noted, but pt grimacing to touch and attempt at PROM; noted bilateral knee flexion contractures; significant swelling/edema, painful with attempts at PROM LUE Deficits / Details: No active movement noted in LUE, significant tone, flexor/add/IR contracture   Lower Extremity Assessment Lower Extremity Assessment: RLE deficits/detail;LLE deficits/detail RLE Deficits / Details: Active movement noted, but functionally <3/5 throughout, bilateral knee flexor contracture LLE Deficits / Details: No active movement noted  LLE; painful with attempts at PROM   Cervical / Trunk Assessment Cervical / Trunk Assessment: Kyphotic;Other exceptions (rotation to R side)   Communication     Cognition Arousal/Alertness: Awake/alert Behavior During Therapy: Flat affect;Anxious Overall Cognitive Status: No family/caregiver present to  determine baseline cognitive functioning                                 General Comments: Pt not answering any questions appropriately, not stating name; talking non-sensically throughout session (hallucinating that cat is on R-shoulder, believed any object placed in visual field was that cat, even though color changing), stating "ow" a lot; no command following noted   General Comments  Pt provided with repositiong of all extremities and trunk to decrease risk of contractures    Exercises     Shoulder Instructions      Home Living Family/patient expects to be discharged to:: Skilled nursing facility                                        Prior Functioning/Environment Level of Independence: Needs assistance  Gait / Transfers Assistance Needed: Suspect dependent for all mobility based on L-side hemiplegia and associated contractures ADL's / Homemaking Assistance Needed: Suspect dependent for all ADL tasks with assist from staff            OT Problem List: Decreased strength;Decreased range of motion;Decreased activity tolerance;Impaired vision/perception;Impaired balance (sitting and/or standing);Decreased coordination;Decreased cognition;Decreased safety awareness;Decreased knowledge of use of DME or AE;Cardiopulmonary status limiting activity;Impaired tone;Pain      OT Treatment/Interventions:      OT Goals(Current goals can be found in the care plan section) Acute Rehab OT Goals Patient Stated Goal: noe stated OT Goal Formulation: With patient Time For Goal Achievement: 06/14/21 Potential to Achieve Goals: Good  OT Frequency:     Barriers to D/C:            Co-evaluation PT/OT/SLP Co-Evaluation/Treatment: Yes Reason for Co-Treatment: Complexity of the patient's impairments (multi-system involvement)   OT goals addressed during session: ADL's and self-care      AM-PAC OT "6 Clicks" Daily Activity     Outcome Measure Help from  another person eating meals?: Total Help from another person taking care of personal grooming?: Total Help from another person toileting, which includes using toliet, bedpan, or urinal?: Total Help from another person bathing (including washing, rinsing, drying)?: Total Help from another person to put on and taking off regular upper body clothing?: Total Help from another person to put on and taking off regular lower body clothing?: Total 6 Click Score: 6   End of Session    Activity Tolerance:   Patient left:    OT Visit Diagnosis: Unsteadiness on feet (R26.81);Other abnormalities of gait and mobility (R26.89);Muscle weakness (generalized) (M62.81);Feeding difficulties (R63.3);Hemiplegia and hemiparesis;Pain                Time: [id number] OT Time Calculation (min): 23 min Charges:  OT General Charges $OT Visit: 1 Visit OT Evaluation $OT Eval Moderate Complexity: [place] OTR/L  Acute Rehab Services  [phone] office number [phone] pager number   Joeseph Amor 05/29/2021, 12:00 PM

## 2021-05-29 NOTE — Care Management Important Message (Signed)
Important Message  Patient Details  Name: Amy [name] MRN: [id number] Date of Birth: 1946/07/25   Medicare Important Message Given:  Yes     Anacaren Kohan Montine Circle 05/29/2021, 4:01 PM

## 2021-05-29 NOTE — Progress Notes (Signed)
[facility] Progress Note   Subjective: Seen in bed prior to dialysis, awake but saying "ouch , ouch  , couch"  in response to sticking.  Tells me that she did not sleep due to pain and cant remember what she had for dinner last night   Objective Vitals:   05/28/21 0837 05/28/21 1729 05/28/21 2053 05/29/21 0540  BP: 118/85 (!) 117/56 (!) 105/54 125/67  Pulse: 85 90 90 96  Resp: '14 17 19 18  '$ Temp: 98.4 F (36.9 C) 98.2 F (36.8 C) 98.4 F (36.9 C) 98.8 F (37.1 C)  TempSrc: Oral  Oral Oral  SpO2: 100% 100%  100%  Weight:      Height:       Physical Exam General: Chronically ill appearing female in NAD Heart:S1,S2 no M/R/G Lungs: CTAB No WOB Abdomen: NABS Extremities: No LE edema. UE edema with contractures present  Dialysis Access: L AVG +T/B     Additional Objective Labs: Basic Metabolic Panel: Recent Labs  Lab 05/23/21 0733 05/24/21 1256 05/25/21 1241 05/26/21 0334 05/27/21 0531 05/29/21 0425  NA 132*   < >  --  136 136 136  K 2.9*   < >  --  3.5 3.7 4.0  CL 95*   < >  --  98 97* 100  CO2 26   < >  --  '27 26 26  '$ GLUCOSE 121*   < >  --  106* 151* 166*  BUN 8   < >  --  19 12 25*  CREATININE 2.49*   < >  --  5.62* 3.75* 5.58*  CALCIUM 8.0*   < >  --  8.1* 8.3* 8.0*  PHOS 1.0*  --  1.7*  --   --   --    < > = values in this interval not displayed.   Liver Function Tests: Recent Labs  Lab 05/23/21 0733 05/27/21 0531  AST  --  34  ALT  --  26  ALKPHOS  --  78  BILITOT  --  0.8  PROT  --  6.4*  ALBUMIN 2.6* 2.4*   No results for input(s): LIPASE, AMYLASE in the last 168 hours. CBC: Recent Labs  Lab 05/23/21 0733 05/24/21 1256 05/26/21 0334 05/27/21 0531 05/29/21 0425  WBC 6.3 8.9 10.5 11.9* 14.3*  NEUTROABS  --   --  7.8* 9.3*  --   HGB 8.5* 8.8* 8.4* 8.8* 8.5*  HCT 25.8* 29.0* 26.0* 28.1* 26.6*  MCV 95.2 99.3 95.9 98.3 97.8  PLT 210 209 216 203 228   Blood Culture    Component Value Date/Time   SDES URINE, CATHETERIZED  05/02/2021 0022   SPECREQUEST NONE 05/02/2021 0022   CULT  05/02/2021 0022    NO GROWTH Performed at [facility] [address], [place], [place] [address]    REPTSTATUS 05/04/2021 FINAL 05/02/2021 0022    Cardiac Enzymes: No results for input(s): CKTOTAL, CKMB, CKMBINDEX, TROPONINI in the last 168 hours. CBG: Recent Labs  Lab 05/28/21 0645 05/28/21 1136 05/28/21 1701 05/28/21 2048 05/29/21 0623  GLUCAP 99 102* 90 97 158*   Iron Studies: No results for input(s): IRON, TIBC, TRANSFERRIN, FERRITIN in the last 72 hours. '@lablastinr3'$ @ Studies/Results: VAS US CAROTID  Result Date: 05/28/2021 Carotid Arterial Duplex Study Patient Name:  Amy [name]  Date of Exam:   05/28/2021 Medical Rec #: [id number]         Accession #:    [id number] Date of Birth:  14-Feb-1946         Patient Gender: F Patient Age:   65Y Exam Location:  [facility] Procedure:      VAS US CAROTID Referring Phys: [id number] [place] --------------------------------------------------------------------------------  Indications:       CVA. Risk Factors:      Hypertension, hyperlipidemia, Diabetes. Comparison Study:  no prior Performing Technologist: Archie Patten RVS  Examination Guidelines: A complete evaluation includes B-mode imaging, spectral Doppler, color Doppler, and power Doppler as needed of all accessible portions of each vessel. Bilateral testing is considered an integral part of a complete examination. Limited examinations for reoccurring indications may be performed as noted.  Right Carotid Findings: +----------+--------+--------+--------+------------------+--------+           PSV cm/sEDV cm/sStenosisPlaque DescriptionComments +----------+--------+--------+--------+------------------+--------+ CCA Prox  83      8               heterogenous               +----------+--------+--------+--------+------------------+--------+ CCA Distal62      7               heterogenous                +----------+--------+--------+--------+------------------+--------+ ICA Prox  80      13      1-39%   heterogenous               +----------+--------+--------+--------+------------------+--------+ ICA Distal71      16                                         +----------+--------+--------+--------+------------------+--------+ ECA       94                                                 +----------+--------+--------+--------+------------------+--------+ +----------+--------+-------+--------+-------------------+           PSV cm/sEDV cmsDescribeArm Pressure (mmHG) +----------+--------+-------+--------+-------------------+ [id number]                                         +----------+--------+-------+--------+-------------------+ +---------+--------+--+--------+-+---------+ VertebralPSV cm/s61EDV cm/s7Antegrade +---------+--------+--+--------+-+---------+  Left Carotid Findings: +----------+--------+--------+--------+------------------+--------+           PSV cm/sEDV cm/sStenosisPlaque DescriptionComments +----------+--------+--------+--------+------------------+--------+ CCA Prox  73      8               heterogenous               +----------+--------+--------+--------+------------------+--------+ CCA Distal49      11              heterogenous               +----------+--------+--------+--------+------------------+--------+ ICA Prox  68      16      1-39%   heterogenous               +----------+--------+--------+--------+------------------+--------+ ICA Distal106     21                                         +----------+--------+--------+--------+------------------+--------+ ECA  35                                                 +----------+--------+--------+--------+------------------+--------+ +----------+--------+--------+--------+-------------------+           PSV cm/sEDV cm/sDescribeArm Pressure (mmHG)  +----------+--------+--------+--------+-------------------+ Subclavian                fistula                     +----------+--------+--------+--------+-------------------+ +---------+--------+--+--------+-+---------+ VertebralPSV cm/s52EDV cm/s9Antegrade +---------+--------+--+--------+-+---------+   Summary: Right Carotid: Velocities in the right ICA are consistent with a 1-39% stenosis. Left Carotid: Velocities in the left ICA are consistent with a 1-39% stenosis. Vertebrals: Bilateral vertebral arteries demonstrate antegrade flow. *See table(s) above for measurements and observations.  Electronically signed by Antony Contras MD on 05/28/2021 at 1:04:48 PM.    Final    Medications:  sodium chloride      ceFAZolin (ANCEF) IV     cefTRIAXone (ROCEPHIN)  IV 1 g (05/28/21 2323)   thiamine injection 250 mg (05/28/21 1443)     stroke: mapping our early stages of recovery book   Does not apply Once   (feeding supplement) PROSource Plus  30 mL Oral BID BM   aspirin EC  81 mg Oral Daily   atorvastatin  40 mg Oral Daily   Chlorhexidine Gluconate Cloth  6 each Topical Q0600   darbepoetin (ARANESP) injection - DIALYSIS  100 mcg Intravenous Q Thu-HD   feeding supplement (NEPRO CARB STEADY)  237 mL Oral TID BM   gabapentin  100 mg Oral QHS   heparin injection (subcutaneous)  5,000 Units Subcutaneous Q8H   midodrine  10 mg Oral TID   mirtazapine  7.5 mg Oral QHS   multivitamin  1 tablet Oral QHS   pantoprazole  40 mg Oral Q0600   sodium chloride flush  3 mL Intravenous Q12H   [START ON 06/03/2021] thiamine injection  100 mg Intravenous Daily   venlafaxine XR  75 mg Oral Q breakfast     Dialysis Orders:  AF MWF 3h 60mn 400/500    62.5kg    3K/2.5 Ca    P2  AVG HeRO   Hep none - Mircera 50 q 2 wks  (7/13)  Venofer 50 q wk - Parsabiv '5mg'$  IV q HD   Assessment/Plan: Melena/Rectal bleeding - GI consulted. Hx rectal ulcers.  EGD 7/27 normal and flex sig with mucosal ulceration c/w sterocoral  ulcers, non bleeding.  hgb low but stable AMS: Palliative care consulted to establish goals of care. Pt is now DNR,  no colonscopy for now. Now getting feeding tube.  Need to get palliative back involved because I think we are heading down a path which is not great for Ms CVadnaisESRD: Changed to T,Th,S Next HD today.  Had been attempting HD in chair but truthfully mental status has not been stable enough to attempt this right now . AND-  as I have said before I really dont think she is a candidate for OP dialysis any longer, too weak to sit up-  confused intermittently -  at high risk to dislodge her needles-  is safety issue.  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  Hypertension/volume  - BP soft. On midodrine for BP support.  Edema improving, UF as tolerated.  Under edw if weights correct. Does not seem to be overloaded at present Anemia  - Hgb trending down, last 8.5. Aranesp to139mg IV q Thursday. Metabolic bone disease -  Corr Ca and phos ok.  Continue to monitor. No binders currently  Nutrition: Poorly eating per notes.Albumin low. Continue protein supps.  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    KCorliss Parish MD 05/29/2021

## 2021-05-29 NOTE — Progress Notes (Addendum)
Patient ID: [name], female   DOB: 1946/06/12, 75 y.o.   MRN: [id number]    Progress Note from the Palliative Medicine Team at [facility]   Patient Name: Amy [name]        Date: 05/29/2021 DOB: March 26, 1946  Age: 75 y.o. MRN#: [id number] Attending Physician: Hosie Poisson, MD Primary Care Physician: [name], MD Admit Date: 05/19/2021   Medical records reviewed, spoke to attending, assessed patient, she is lethargic and unable to participate in conversation currently.   Not following commands   poor oral intake, albumin 2.4   No family at bedside.  I spoke to Dr. Moshe Cipro prior to this visit.  Concerns verbalized regarding the patient's ability to tolerate or even be a candidate for outpatient dialysis secondary to increased weakness and intermittent confusion.   Safety is always a priority.    This NP spoke to son/Brian by phone as a follow up for palliative medicine needs and emotional support.  Continued education regarding current medical situation, patient's multiple comorbidities and her high risk for decompensation. He tells me he understands his mother's current medical situation.  He tells me that since his support person Joelene Millin has been at the bedside, over  the last few days they have seen improvements in [name] overall situation.  This is giving him hope.   Family remain hopeful for improvement; anticipate PEG placement today and ongoing hemodialysis.  When patient is medically stable for discharge family anticipates skilled nursing facility   Plan of care: -DNR/DNI -PEG placement for nutritional support -Continue hemodialysis -Continue to treat the treatable and hope for improvement - Transition back to skilled nursing facility today when medically stable  Again stressed the importance of continued conversation with his family and the medical providers regarding overall plan of care and treatment options,  ensuring decisions are  within the context of the patients values and GOCs.  Questions and concerns addressed   Discussed with Dr  Karleen Hampshire and Dr Delia Chimes   Total time spent was 35 minutes  This nurse practitioner informed  the family and the attending that I will be out of the hospital until Monday morning.  If the patient is still hospitalized I will follow-up at that time.  Call palliative medicine team phone # [phone] with questions or concerns in the interim.   Greater than 50% of the time was spent in counseling and coordination of care  Amy Lessen NP  Palliative Medicine Team Team Phone # [phone] Pager [phone]

## 2021-05-29 NOTE — Progress Notes (Signed)
PROGRESS NOTE    Amy [name]  [id number] DOB: [birth year] DOA: 05/19/2021 PCP: [name], MD    Chief Complaint  Patient presents with   Rectal Bleeding    Brief Narrative:    Amy [name] is a 75 y.o. 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 Pt underwent EGD which was wnl and sigmoidoscopy showing stercoral ulcers and not a good prep. GI recommended colonoscopy for further evaluation but she would need NG tube for the prep. Patient's family didn't want colonoscopy at this time. In view of her multiple co morbidities, recurrent admissions, palliative care consulted for goals of care discussions.   In view of clinical decline in the mental status in the last few months, we ordered a CT head and MRI brain without contrast for further evaluation. She was found to have Large right MCA territory infarct. Advanced chronic microvascular ischemic changes of the white matter and moderate parenchymal loss. MR angiogram severely degraded by motion. No intracranial ICA or basilar artery occlusion.neurology consulted, no new recommendations at this time.   As per nephrology she is not a candidate for OP HD given confusion and weakness and overall HD is not enhancing her QOL.  Discussed with the family and they would like to speak to the nephrologist.   [name] and family is concerned that she has poor oral intake in the last 4 months, they feel that's the contributing factor to her decline and would like PEG placement for artificial feeds to augment her nutritional status.   IR consulted for PEG placement.     Assessment & Plan:   Principal Problem:   GI bleed Active Problems:   Essential hypertension   Type 2 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, with long-term current use of insulin  (HCC)   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   ESRD on dialysis ([place])   Dyslipidemia   Hypokalemia   Stage 2 skin ulcer of sacral region [facility])   Cerebral thrombosis with cerebral infarction   Protein-calorie malnutrition, severe   GI bleed/anemia of acute blood loss superimposed on anemia of chronic disease from ESRD.  Hemoccult positive GI consulted and  she underwent , EGD wnl, and flex sigmoidoscopy, showing Mucosal ulceration c/w Stercoral ulcers. Non bleeding. she will need colonoscopy.  In view of her advanced age, multiple co morbidities, multiple admissions for the rectal bleeding , palliative care consulted and family has decided against  colonoscopy for now.  Pt continued to decline, with minimal oral intake.  Transfuse to keep hemoglobin greater than 7. Hemoglobin remains stable around 8.8. no bleeding since admission.  As per gastroenterology patient can be restarted on Eliquis. Plan to resume Eliquis after PEG placement.    End-stage renal disease on dialysis As per nephrology she is not a candidate for OP HD given confusion and weakness and overall HD is not enhancing her QOL. Nephrology consulted and HD as per nehrology. She will not be able to sit in the chair for OP HD .    Hypomagnesemia and hypokalemia  Replaced.    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    CVA with left hemiparesis and left sided neglect. Continue with lipitor. Neurology on board, it appears pt had chronic strokes. US carotids ordered.  Hemoglobin A1c is 5.1 Lipid panel reviewed. LDL is 68   Type 2 diabetes mellitus with end-stage renal disease on chronic dialysis, insulin-dependent Last A1c 6.9 Diet controlled Continue with sliding scale insulin for now CBG (last 3)  Recent Labs    05/28/21 1701 05/28/21 2048 05/29/21 0623  GLUCAP 90 97 158*   Minimal oral intake, continue with sliding scale insulin. SLP  evaluation  recommended advancing diet to dysphagia 3 diet so she can have more options.  Unfortunately her intake has been minimal.    Hyperlipidemia Continue with statin    Hypotension  Resolved, on midodrine.   Stage 2 Pressure injury present on admission.  Pressure Injury 05/20/21 Coccyx Mid Stage 2 -  Partial thickness loss of dermis presenting as a shallow open injury with a red, pink wound bed without slough. 1X0.5X0 (Active)  05/20/21 1315  Location: Coccyx  Location Orientation: Mid  Staging: Stage 2 -  Partial thickness loss of dermis presenting as a shallow open injury with a red, pink wound bed without slough.  Wound Description (Comments): 1X0.5X0  Present on Admission: Yes  Wound care .    Acute metabolic encephalopathy/ deconditioning, failure to thrive:  As per ED notes/ SNF pt mental status is at baseline on admission. But family is adamant that is not her baseline.  CT head without contrast shows multiple strokes when compared to the CT done in March 2022.  Discussed the results of the CT with the patient's son, who said he is aware of the patient's previous strokes EEG ordered showed cortical dysfunction arising from right hemisphere secondary to underlying structural abnormality/stroke.  Moderate diffuse encephalopathy of nonspecific etiology.  No seizures or epileptiform discharges were seen throughout the recording. MRI brain without contrast showed old strokes. Neurology consulted, but no new recommendations.  TSH wnl, vitamin b level is pending.  vitamin B12 and folate within normal limits. We will empirically treat for UTI with 3 days of rocephin, as we are unable to get a Urine sample.     Failure to thrive  As per the patient's son she has not been eating well for the last 4 months he would like a PEG placement at this time IR consulted for PEG placement. CT abd without contrast done and it shows Marginal percutaneous window for gastrostomy access, though  the stomach is decompressed. Plan for PEG placement as per IR.    Incidental Indeterminate ovoid hepatic hypodensity in the anterior aspect of segment 4 measuring up to 4.9 cm. This may represent a mass or focal fatty infiltration.  Recommend outpatient follow up with an MRI of the abdomen/ liver for further evaluation.    Hypokalemia and hypophosphatemia:  Potassium and phosphorus improved on repeat labs    DVT prophylaxis: Heparin.  Code Status: DNR Family Communication: (none at bedside. ) Discussed with family member at bedside, on 05/28/21 Disposition:   Status is: Inpatient.   The patient will require care spanning > 2 midnights and should be moved to inpatient because: Ongoing diagnostic testing needed not appropriate for outpatient work up, Unsafe d/c plan, and IV treatments appropriate due to intensity of illness or inability to take PO  Dispo: The patient is from: SNF              Anticipated d/c is to: SNF              Patient currently is medically stable to d/c.   Difficult to place patient No  Consultants:  Gastroenterology Nephrology.  Palliative care  Procedures: EGD, Flex sigmoidoscopy.   Antimicrobials:  Antibiotics Given (last 72 hours)     Date/Time Action Medication Dose Rate   05/28/21 2323 New Bag/Given   cefTRIAXone (ROCEPHIN) 1 g in sodium chloride 0.9 % 100 mL IVPB 1 g 200 mL/hr         Subjective: She appears comfortable.   Objective: Vitals:   05/29/21 1054 05/29/21 1130 05/29/21 1200 05/29/21 1230  BP: (!) 101/40 (!) 91/47 (!) 117/52 (!) 109/53  Pulse: (!) 102 98 92 96  Resp:      Temp:      TempSrc:      SpO2:      Weight:      Height:        Intake/Output Summary (Last 24 hours) at 05/29/2021 1333 Last data filed at 05/29/2021 0600 Gross per 24 hour  Intake 390.57 ml  Output 0 ml  Net 390.57 ml    Filed Weights   05/26/21 1222 05/26/21 2054 05/29/21 0915  Weight: 58.5 kg 58.5 kg 57.5 kg     Examination:  General exam: Elderly woman, Chronically ill appearing, comfortable.  Respiratory system: Air entry fair, on 2 lit of [place] oxygen. No wheezing heard.  Cardiovascular system: RRR, no JVD, no pedal edema.  Gastrointestinal system: Abdomen is soft, bowel sounds wnl.  Central nervous system: sleepy, left hemiparesis with left sided neglect.  Extremities: no leg edema. Skin: Stage II sacral pressure ulcer Psychiatry: cannot be assessed.     Data Reviewed: I have personally reviewed following labs and imaging studies  CBC: Recent Labs  Lab 05/23/21 0733 05/24/21 1256 05/26/21 0334 05/27/21 0531 05/29/21 0425  WBC 6.3 8.9 10.5 11.9* 14.3*  NEUTROABS  --   --  7.8* 9.3*  --   HGB 8.5* 8.8* 8.4* 8.8* 8.5*  HCT 25.8* 29.0* 26.0* 28.1* 26.6*  MCV 95.2 99.3 95.9 98.3 97.8  PLT 210 209 216 203 228     Basic Metabolic Panel: Recent Labs  Lab 05/23/21 0733 05/24/21 1256 05/25/21 1241 05/26/21 0334 05/27/21 0531 05/29/21 0425  NA 132* 134*  --  136 136 136  K 2.9* 4.1  --  3.5 3.7 4.0  CL 95* 97*  --  98 97* 100  CO2 26 21*  --  '27 26 26  '$ GLUCOSE 121* 96  --  106* 151* 166*  BUN 8 10  --  19 12 25*  CREATININE 2.49* 3.97*  --  5.62* 3.75* 5.58*  CALCIUM 8.0* 8.1*  --  8.1* 8.3* 8.0*  PHOS 1.0*  --  1.7*  --   --   --      GFR: Estimated Creatinine Clearance: 8.2 mL/min (A) (by C-G formula based on SCr of 5.58 mg/dL (H)).  Liver Function Tests: Recent Labs  Lab 05/23/21 0733 05/27/21 0531  AST  --  34  ALT  --  26  ALKPHOS  --  78  BILITOT  --  0.8  PROT  --  6.4*  ALBUMIN 2.6* 2.4*     CBG: Recent Labs  Lab 05/28/21 0645 05/28/21 1136 05/28/21 1701 05/28/21 2048 05/29/21 0623  GLUCAP 99 102* 90 97 158*      No results found for this or any previous visit (from the past 240 hour(s)).         Radiology Studies: VAS US CAROTID  Result Date: 05/28/2021 Carotid Arterial Duplex Study Patient Name:  JOVANNI R Neyman  Date of  Exam:   05/28/2021 Medical Rec #: [id number]         Accession #:    [id number] Date of Birth: 24-Oct-1946         Patient Gender: F Patient Age:   75Y Exam Location:  [facility] Procedure:      VAS US CAROTID Referring Phys: [id number] [place] --------------------------------------------------------------------------------  Indications:       CVA. Risk Factors:      Hypertension, hyperlipidemia, Diabetes. Comparison Study:  no prior Performing Technologist: Archie Patten RVS  Examination Guidelines: A complete evaluation includes B-mode imaging, spectral Doppler, color Doppler, and power Doppler as needed of all accessible portions of each vessel. Bilateral testing is considered an integral part of a complete examination. Limited examinations for reoccurring indications may be performed as noted.  Right Carotid Findings: +----------+--------+--------+--------+------------------+--------+           PSV cm/sEDV cm/sStenosisPlaque DescriptionComments +----------+--------+--------+--------+------------------+--------+ CCA Prox  83      8               heterogenous               +----------+--------+--------+--------+------------------+--------+ CCA Distal62      7               heterogenous               +----------+--------+--------+--------+------------------+--------+ ICA Prox  80      13      1-39%   heterogenous               +----------+--------+--------+--------+------------------+--------+ ICA Distal71      16                                         +----------+--------+--------+--------+------------------+--------+ ECA       94                                                 +----------+--------+--------+--------+------------------+--------+ +----------+--------+-------+--------+-------------------+           PSV cm/sEDV cmsDescribeArm Pressure (mmHG) +----------+--------+-------+--------+-------------------+ [id number]                                          +----------+--------+-------+--------+-------------------+ +---------+--------+--+--------+-+---------+ VertebralPSV cm/s61EDV cm/s7Antegrade +---------+--------+--+--------+-+---------+  Left Carotid Findings: +----------+--------+--------+--------+------------------+--------+           PSV cm/sEDV cm/sStenosisPlaque DescriptionComments +----------+--------+--------+--------+------------------+--------+ CCA Prox  73      8               heterogenous               +----------+--------+--------+--------+------------------+--------+ CCA Distal49      11              heterogenous               +----------+--------+--------+--------+------------------+--------+ ICA Prox  68      16      1-39%   heterogenous               +----------+--------+--------+--------+------------------+--------+ ICA Distal106     21                                         +----------+--------+--------+--------+------------------+--------+  ECA       35                                                 +----------+--------+--------+--------+------------------+--------+ +----------+--------+--------+--------+-------------------+           PSV cm/sEDV cm/sDescribeArm Pressure (mmHG) +----------+--------+--------+--------+-------------------+ Subclavian                fistula                     +----------+--------+--------+--------+-------------------+ +---------+--------+--+--------+-+---------+ VertebralPSV cm/s52EDV cm/s9Antegrade +---------+--------+--+--------+-+---------+   Summary: Right Carotid: Velocities in the right ICA are consistent with a 1-39% stenosis. Left Carotid: Velocities in the left ICA are consistent with a 1-39% stenosis. Vertebrals: Bilateral vertebral arteries demonstrate antegrade flow. *See table(s) above for measurements and observations.  Electronically signed by Antony Contras MD on 05/28/2021 at 1:04:48 PM.    Final          Scheduled Meds:   stroke: mapping our early stages of recovery book   Does not apply Once   (feeding supplement) PROSource Plus  30 mL Oral BID BM   aspirin EC  81 mg Oral Daily   atorvastatin  40 mg Oral Daily   Chlorhexidine Gluconate Cloth  6 each Topical Q0600   darbepoetin (ARANESP) injection - DIALYSIS  100 mcg Intravenous Q Thu-HD   feeding supplement (NEPRO CARB STEADY)  237 mL Oral TID BM   gabapentin  100 mg Oral QHS   heparin injection (subcutaneous)  5,000 Units Subcutaneous Q8H   midodrine  10 mg Oral TID   mirtazapine  7.5 mg Oral QHS   multivitamin  1 tablet Oral QHS   pantoprazole  40 mg Oral Q0600   sodium chloride flush  3 mL Intravenous Q12H   [START ON 06/03/2021] thiamine injection  100 mg Intravenous Daily   venlafaxine XR  75 mg Oral Q breakfast   Continuous Infusions:  sodium chloride      ceFAZolin (ANCEF) IV     cefTRIAXone (ROCEPHIN)  IV 1 g (05/28/21 2323)   thiamine injection 250 mg (05/28/21 1443)     LOS: 9 days        Hosie Poisson, MD [facility]   To contact the attending provider between 7A-7P or the covering provider during after hours 7P-7A, please log into the web site [email or web address] and access using universal [place] password for that web site. If you do not have the password, please call the hospital operator.  05/29/2021, 1:33 PM

## 2021-05-29 NOTE — Procedures (Signed)
Interventional Radiology Procedure Note  Patient brought to IR for percutaneous G tube placement.    No safe window could be identified to percutaneous access of stomach due to overlying transverse colon.  Complications: None  Amy Roux, MD [phone]

## 2021-05-29 NOTE — Progress Notes (Signed)
Orthopedic Tech Progress Note Patient Details:  Amy [name] 1945-11-28 [id number] Called RN to let them know prevalon boots are in materials  Patient ID: Amy [name], female   DOB: 08-07-1946, 75 y.o.   MRN: [id number]  [name] 05/29/2021, 8:40 PM

## 2021-05-29 NOTE — Evaluation (Signed)
Physical Therapy Evaluation Patient Details Name: Amy [name] MRN: [id number] DOB: 01/13/1946 Today's Date: 05/29/2021   History of Present Illness  Pt is a 75 y.o. female admitted from short-term rehab at SNF on 05/19/21 with dark stool and fecal occult. S/p EGD and sigmoidoscopy which showed stercoral ulcers. Plan for PEG tube placement 8/4. Pt with decline in the past few months with failure to thrive, acute metabolic encephalopathy. 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 PMH includes ESRD (on HD), HTN, CVA (residual L-side hemiparesis).   Clinical Impression  Patient evaluated by Physical Therapy with no further acute PT needs identified. PTA, pt from short-term rehab at SNF since prior stroke. Pt confused and poor historian, having visual hallucinations during session, no command following noted. Pt requiring totalA for bed mobility, pericare and reposition to encourage neutral/midline posture. No active movement noted in LUE/LLE, as well as L-side inattention. Recommend return to SNF for continued services. Acute PT is signing off. Thank you for this referral.    Follow Up Recommendations SNF;Supervision for mobility/OOB    Equipment Recommendations  Hospital bed (hoyer lift)    Recommendations for Other Services       Precautions / Restrictions Precautions Precautions: Fall;Other (comment) Precaution Comments: bladder/bowel incontinence; L-side hemiplegia, L-side inattention; hallucinating Restrictions Weight Bearing Restrictions: No      Mobility  Bed Mobility Overal bed mobility: Needs Assistance Bed Mobility: Rolling Rolling: Total assist         General bed mobility comments: Pt resistant to mobility, suspect related to pain/stiffness and fear; totalA to roll R/L for pericare and linen change, eventually relaxing into L sidelying and able to maintain once positioned without assist    Transfers                 General transfer comment: Will  require maximove lift transfer OOB  Ambulation/Gait                Stairs            Wheelchair Mobility    Modified Rankin (Stroke Patients Only)       Balance                                             Pertinent Vitals/Pain Pain Assessment: Faces Faces Pain Scale: Hurts even more Pain Location: Grimacing/moaning/guarding with BUE/BLE/cervical ROM and repositioning Pain Descriptors / Indicators: Grimacing;Guarding Pain Intervention(s): Limited activity within patient's tolerance    Home Living Family/patient expects to be discharged to:: Skilled nursing facility                      Prior Function Level of Independence: Needs assistance   Gait / Transfers Assistance Needed: Suspect dependent for all mobility based on L-side hemiplegia and associated contractures  ADL's / Homemaking Assistance Needed: Suspect dependent for all ADL tasks with assist from staff        Hand Dominance        Extremity/Trunk Assessment   Upper Extremity Assessment Upper Extremity Assessment: LUE deficits/detail;RUE deficits/detail RUE Deficits / Details: Functionally <3/5 throughout, active movement noted, but pt grimacing to touch and attempt at PROM; noted bilateral knee flexion contractures; significant swelling/edema, painful with attempts at PROM LUE Deficits / Details: No active movement noted in LUE, significant tone, flexor/add/IR contracture    Lower Extremity Assessment Lower Extremity Assessment:  RLE deficits/detail;LLE deficits/detail RLE Deficits / Details: Active movement noted, but functionally <3/5 throughout, bilateral knee flexor contracture LLE Deficits / Details: No active movement noted LLE; painful with attempts at PROM    Cervical / Trunk Assessment Cervical / Trunk Assessment: Kyphotic;Other exceptions (rotation to R side)  Communication      Cognition Arousal/Alertness: Awake/alert Behavior During Therapy: Flat  affect;Anxious Overall Cognitive Status: No family/caregiver present to determine baseline cognitive functioning                                 General Comments: Pt not answering any questions appropriately, not stating name; talking non-sensically throughout session (hallucinating that cat is on R-shoulder, believed any object placed in visual field was that cat, even though color changing), stating "ow" a lot; no command following noted      General Comments General comments (skin integrity, edema, etc.): Pt repositioned in partial chair position with pillows/blankets positioned to encourage cervical neutral rotation, L elbow/wrist/finger extension, bilateral knee extension, bilateral heels floated off bed; request MD for prevalon boots to prevent flexion contractures and heel sores (verbal order given; boots ordered; RN aware). SpO2 99% on 2L O2, down to 85% on RA (unsure reliable pleth) therefore 2L O2 replaced    Exercises     Assessment/Plan    PT Assessment All further PT needs can be met in the next venue of care  PT Problem List Decreased strength;Decreased range of motion;Decreased activity tolerance;Decreased balance;Decreased mobility;Decreased coordination;Decreased cognition;Decreased knowledge of use of DME;Decreased safety awareness;Decreased knowledge of precautions;Impaired sensation;Impaired tone;Decreased skin integrity;Pain       PT Treatment Interventions      PT Goals (Current goals can be found in the Care Plan section)  Acute Rehab PT Goals Patient Stated Goal: noe stated PT Goal Formulation: Patient unable to participate in goal setting Time For Goal Achievement: 06/12/21 Potential to Achieve Goals: Poor    Frequency     Barriers to discharge        Co-evaluation PT/OT/SLP Co-Evaluation/Treatment: Yes Reason for Co-Treatment: Complexity of the patient's impairments (multi-system involvement) PT goals addressed during session:  Mobility/safety with mobility;Other (comment) (positioning/pressure relief) OT goals addressed during session: ADL's and self-care       AM-PAC PT "6 Clicks" Mobility  Outcome Measure Help needed turning from your back to your side while in a flat bed without using bedrails?: Total Help needed moving from lying on your back to sitting on the side of a flat bed without using bedrails?: Total Help needed moving to and from a bed to a chair (including a wheelchair)?: Total Help needed standing up from a chair using your arms (e.g., wheelchair or bedside chair)?: Total Help needed to walk in hospital room?: Total Help needed climbing 3-5 steps with a railing? : Total 6 Click Score: 6    End of Session Equipment Utilized During Treatment: Oxygen Activity Tolerance: Patient limited by pain;Other (comment) (limited by cognition/ability to participate) Patient left: in bed;with call bell/phone within reach;with bed alarm set Nurse Communication: Mobility status;Need for lift equipment PT Visit Diagnosis: Other abnormalities of gait and mobility (R26.89);Hemiplegia and hemiparesis;Other symptoms and signs involving the nervous system (M42.683)    Time: 4196-2229 PT Time Calculation (min) (ACUTE ONLY): 24 min   Charges:   PT Evaluation $PT Eval Moderate Complexity: [place], PT, DPT Acute Rehabilitation Services  Pager [phone] Office  [place] 05/29/2021, 11:13 AM

## 2021-05-29 NOTE — Progress Notes (Signed)
SLP Cancellation Note  Patient Details Name: Amy [name] MRN: [id number] DOB: 1946-07-22   Cancelled treatment:       Reason Eval/Treat Not Completed: Medical issues which prohibited therapy (Pt is currently NPO for G-tube placement today. SLP will follow up on subsequent date.)  Holdyn Poyser I. Hardin Negus, [place], [place] Office number [phone] Pager [phone]  Horton Marshall 05/29/2021, 8:07 AM

## 2021-05-29 NOTE — Plan of Care (Signed)
  Problem: Clinical Measurements: Goal: Ability to maintain clinical measurements within normal limits will improve Outcome: Not Progressing   Problem: Nutrition: Goal: Adequate nutrition will be maintained Outcome: Not Progressing   

## 2021-05-30 ENCOUNTER — Inpatient Hospital Stay (HOSPITAL_COMMUNITY): Payer: Medicare Other

## 2021-05-30 DIAGNOSIS — E876 Hypokalemia: Secondary | ICD-10-CM

## 2021-05-30 DIAGNOSIS — E1122 Type 2 diabetes mellitus with diabetic chronic kidney disease: Secondary | ICD-10-CM

## 2021-05-30 DIAGNOSIS — T782XXA Anaphylactic shock, unspecified, initial encounter: Secondary | ICD-10-CM | POA: Diagnosis not present

## 2021-05-30 DIAGNOSIS — L98429 Non-pressure chronic ulcer of back with unspecified severity: Secondary | ICD-10-CM

## 2021-05-30 DIAGNOSIS — I6330 Cerebral infarction due to thrombosis of unspecified cerebral artery: Secondary | ICD-10-CM | POA: Diagnosis not present

## 2021-05-30 DIAGNOSIS — I6389 Other cerebral infarction: Secondary | ICD-10-CM

## 2021-05-30 DIAGNOSIS — K922 Gastrointestinal hemorrhage, unspecified: Secondary | ICD-10-CM | POA: Diagnosis not present

## 2021-05-30 DIAGNOSIS — Z794 Long term (current) use of insulin: Secondary | ICD-10-CM

## 2021-05-30 DIAGNOSIS — D638 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: Secondary | ICD-10-CM | POA: Diagnosis not present

## 2021-05-30 LAB — GLUCOSE, CAPILLARY
Glucose-Capillary: 109 mg/dL — ABNORMAL HIGH (ref 70–99)
Glucose-Capillary: 85 mg/dL (ref 70–99)
Glucose-Capillary: 56 mg/dL — ABNORMAL LOW (ref 70–99)
Glucose-Capillary: 89 mg/dL (ref 70–99)

## 2021-05-30 LAB — ECHOCARDIOGRAM COMPLETE
Area-P 1/2: 6.54 cm2
Height: 67 in
S' Lateral: 1.9 cm
Weight: 2028.23 oz

## 2021-05-30 LAB — MAGNESIUM: Magnesium: 1.9 mg/dL (ref 1.7–2.4)

## 2021-05-30 LAB — PHOSPHORUS: Phosphorus: 1.8 mg/dL — ABNORMAL LOW (ref 2.5–4.6)

## 2021-05-30 MED ORDER — GLUCOSE 40 % PO GEL
ORAL | Status: AC
  Administered 2021-05-30: 31 g
  Filled 2021-05-30: qty 1

## 2021-05-30 MED ORDER — COLLAGENASE 250 UNIT/GM EX OINT
TOPICAL_OINTMENT | Freq: Every day | CUTANEOUS | Status: DC
  Filled 2021-05-30 (×4): qty 30

## 2021-05-30 MED ORDER — OSMOLITE 1.5 CAL PO LIQD
1000.0000 mL | ORAL | Status: DC
  Filled 2021-05-30 (×9): qty 1000

## 2021-05-30 MED ORDER — FREE WATER: 150.0000 mL | Status: DC

## 2021-05-30 NOTE — Consult Note (Signed)
Attempted to see patient. Gone to [place]. Will check again shortly. Cathlean Marseilles Tamala Julian, MSN, RN, [place], Lysle Pearl, [facility] Wound Treatment Associate Pager [phone]

## 2021-05-30 NOTE — Progress Notes (Signed)
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  Hemoccult was noted to be positive.  Complicated by use of anticoagulants with Eliquis.  Recent admission to [facility] on 5/19 through 04/22/21 with flex sig on 6/1 with multiple rectal ulcers likely secondary to trauma from enemas and constipation.  In the ED, afebrile, bp: 126/88, HR: 55, RR: 18 oxygen: 94% room air. Pertinent labs: Potassium: 2.8, Sodium: 132, BUN 18 and creatinine 5.30 Fecal occult: positive , Hgb: 9.6 (9.6 two weeks ago), Hct: 29.6.  Gastroenterology was consulted by EDP.  TRH consulted for further evaluation management of GIB.   Assessment & Plan:   Principal Problem:   GI bleed Active Problems:   Essential hypertension   Type 2 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, with long-term current use of insulin (HCC)   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   ESRD on dialysis ([place])   Dyslipidemia   Hypokalemia   Stage 2 skin ulcer of sacral region [facility])   Cerebral thrombosis with cerebral infarction   Protein-calorie malnutrition, severe  GI bleed 2/2 rectal ulcers Gastroenterology was consulted and followed during hospital course.  Patient underwent EGD which was normal and flex sigmoidoscopy with clean base, nonbleeding distal rectal ulcers (stercoral ulcers).  Gastroenterology now signed off on 7/28. --Hemoglobin stable, 8.5 on 05/29/21; no further reports of bleeding during hospitalization --Mirlax once daily --Okay to restart Eliquis per GI, awaiting for possible PEG tube placement.  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  MRI brain findings were similar in appearance to her prior MRI at [facility] with no drastic changes which is notable for a large right MCA stroke with advanced microvascular ischemic changes of the white matter, MRA head degraded but did not show occlusion and bilateral carotid ultrasound revealed R ICA and LICA with [id number] stenosis.  EEG with cortical dysfunction right hemisphere likely secondary to underlying structural abnormality/stroke and moderate encephalopathy with no seizure activity or epileptiform discharges.  Hemoglobin A1c 5.1, LDL 68.  No further recommendations per neurology and signed off 05/28/2021. --Atorvastatin 40 mg p.o. daily  Hyperlipidemia: Atorvastatin 40 mg p.o. daily  Type 2 diabetes mellitus Hemoglobin A1c 5.1, well controlled.  Diet controlled at baseline.  ESRD on HD Currently per nephrology, not a candidate for continued outpatient HD given her confusion, weakness and inability to sit up in a chair for HD outpatient.  Overall HD is not enhancing her quality of life at this time. --Nephrology following, appreciate assistance continues on HD while inpatient --Will need further palliative care discussions with nephrology involvement with direct information to family members regarding nephrology's plans not to continued HD outpatient.  Chronic thrombus left upper extremity --Eliquis currently on hold  Hypotension: Resolved --Midodrine 10 mg PO daily  Stage II pressure injury, POA Pressure Injury 05/20/21 Coccyx Mid Stage 2 -  Partial thickness loss of dermis presenting as a shallow open injury with a red, pink wound bed without slough. 1X0.5X0 (Active)  05/20/21 1315  Location: Coccyx  Location Orientation: Mid  Staging: Stage 2 -  Partial thickness loss of dermis  presenting as a shallow open injury with a red, pink wound bed without slough.  Wound Description (Comments): 1X0.5X0  Present on  Admission: Yes  --Continue wound care  Acute metabolic encephalopathy Adult failure to thrive Deconditioning Patient presenting from SNF with worsening confusion per family members; although ED noted that her mental status is currently at baseline per SNF but this is discordant with family.  CT head without contrast with multiple strokes.  MRI brain without contrast notable for old large right MCA stroke.  EEG with cortical dysfunction, moderate encephalopathy without seizure or epileptiform discharges.  Neurology was consulted, no further recommendations at this time.  Due to her poor oral intake, IR was consulted for PEG placement; unfortunately unable to place on 8/4 due to overlying transverse colon.  General surgery was consulted on 8/5 for surgical G-tube placement.  Urinary tract infection --Ceftriaxone 1 g IV every 24 hours x3 days  Hepatic hypodensity Incidental finding of an ovoid hepatic hypodensity anterior aspect of liver measuring up to 4.9 cm, could represent mass versus focal fatty infiltration.  Will need outpatient follow-up with MRI liver for further evaluation if goals of care remain aggressive.  Hypokalemia Hypophosphatemia Repleted.  GERD: Continue PPI  Goals of care Long discussion with POA, patient's son Amy [name] via telephone on 05/30/2021.  Her son believes that her weakness stems from poor oral intake over the last 5 weeks and is waiting for feeding tube placement.  Unfortunately IR unable to place percutaneous tube yesterday due to poor window for insertion.  Per nephrology's notes, unlikely plan to continue HD outpatient given her weakness and inability to sit in a recliner.  Per son, Amy [name] states plan was for feeding tube and continue outpatient hemodialysis.  Consulted general surgery, Dr. Grandville Silos on 8/5 with no recommendation to place surgical feeding tube if patient not going to continue outpatient hemodialysis.  There seems to be a disconnect in the goals of care  with medical providers and POA. --Will plan for family meeting on Monday 8/8 at 1:30 PM   DVT prophylaxis: heparin injection 5,000 Units Start: 05/26/21 1824 Place TED hose Start: 05/19/21 1706   Code Status: DNR Family Communication: No family present at bedside this morning, updated Amy [name] and Amy [name] via telephone this afternoon  Disposition Plan:  Level of care: Med-Surg Status is: Inpatient  Remains inpatient appropriate because:Ongoing diagnostic testing needed not appropriate for outpatient work up, Unsafe d/c plan, IV treatments appropriate due to intensity of illness or inability to take PO, and Inpatient level of care appropriate due to severity of illness  Dispo: The patient is from: SNF              Anticipated d/c is to: SNF              Patient currently is not medically stable to d/c.   Difficult to place patient No    Consultants:  Neurology Palliative care General surgery Interventional radiology [facility]  Procedures:  EEG 8/1: Suggestive of cortical dysfunction right hemisphere likely secondary to underlying structural abnormality/stroke, moderate encephalopathy, no seizure activity or epileptiform discharges. EGD 7/27, normal exam Flex sigmoidoscopy 7/27: 4 nonbleeding ulcers  Antimicrobials:  Ceftriaxone 8/3>> Cefazolin 8/3-8/5  Subjective: Patient seen examined at bedside, resting comfortably.  Confused.  No family present.  IR consulted yesterday for PEG tube placement, unsuccessful as poor window for insertion.  Objective: Vitals:   05/29/21 1809 05/29/21 2109 05/30/21 0510 05/30/21 1051  BP: 119/71 (!) 104/51 (!) 118/48 [name])  111/48  Pulse: (!) 101 96 98 94  Resp: '19 16 18 18  '$ Temp: (!) 97.5 F (36.4 C) 98.3 F (36.8 C) 97.9 F (36.6 C) 98.5 F (36.9 C)  TempSrc: Oral  Oral Oral  SpO2:   100% 100%  Weight:  57.5 kg    Height:        Intake/Output Summary (Last 24 hours) at 05/30/2021 1403 Last data filed at 05/30/2021 1344 Gross per 24  hour  Intake 303 ml  Output 0 ml  Net 303 ml   Filed Weights   05/26/21 2054 05/29/21 0915 05/29/21 2109  Weight: 58.5 kg 57.5 kg 57.5 kg    Examination:  General exam: Appears calm and comfortable  Respiratory system: Clear to auscultation. Respiratory effort normal. Cardiovascular system: S1 & S2 heard, RRR. No JVD, murmurs, rubs, gallops or clicks. No pedal edema. Gastrointestinal system: Abdomen is nondistended, soft and nontender. No organomegaly or masses felt. Normal bowel sounds heard. Central nervous system: Alert and oriented. No focal neurological deficits. Extremities: Symmetric 5 x 5 power. Skin: No rashes, lesions or ulcers Psychiatry: Judgement and insight appear normal. Mood & affect appropriate.     Data Reviewed: I have personally reviewed following labs and imaging studies  CBC: Recent Labs  Lab 05/24/21 1256 05/26/21 0334 05/27/21 0531 05/29/21 0425  WBC 8.9 10.5 11.9* 14.3*  NEUTROABS  --  7.8* 9.3*  --   HGB 8.8* 8.4* 8.8* 8.5*  HCT 29.0* 26.0* 28.1* 26.6*  MCV 99.3 95.9 98.3 97.8  PLT 209 216 203 [id number]   Basic Metabolic Panel: Recent Labs  Lab 05/24/21 1256 05/25/21 1241 05/26/21 0334 05/27/21 0531 05/29/21 0425  NA 134*  --  136 136 136  K 4.1  --  3.5 3.7 4.0  CL 97*  --  98 97* 100  CO2 21*  --  '27 26 26  '$ GLUCOSE 96  --  106* 151* 166*  BUN 10  --  19 12 25*  CREATININE 3.97*  --  5.62* 3.75* 5.58*  CALCIUM 8.1*  --  8.1* 8.3* 8.0*  PHOS  --  1.7*  --   --   --    GFR: Estimated Creatinine Clearance: 8.2 mL/min (A) (by C-G formula based on SCr of 5.58 mg/dL (H)). Liver Function Tests: Recent Labs  Lab 05/27/21 0531  AST 34  ALT 26  ALKPHOS 78  BILITOT 0.8  PROT 6.4*  ALBUMIN 2.4*   No results for input(s): LIPASE, AMYLASE in the last 168 hours. Recent Labs  Lab 05/25/21 2022 05/27/21 0531  AMMONIA 61* 11   Coagulation Profile: No results for input(s): INR, PROTIME in the last 168 hours. Cardiac Enzymes: No results  for input(s): CKTOTAL, CKMB, CKMBINDEX, TROPONINI in the last 168 hours. BNP (last 3 results) No results for input(s): PROBNP in the last 8760 hours. HbA1C: Recent Labs    05/28/21 0738  HGBA1C 5.1   CBG: Recent Labs  Lab 05/28/21 2048 05/29/21 0623 05/29/21 1840 05/29/21 2111 05/30/21 1154  GLUCAP 97 158* 127* 118* 109*   Lipid Profile: Recent Labs    05/28/21 0738  CHOL 133  HDL 42  LDLCALC 68  TRIG 117  CHOLHDL 3.2   Thyroid Function Tests: No results for input(s): TSH, T4TOTAL, FREET4, T3FREE, THYROIDAB in the last 72 hours. Anemia Panel: No results for input(s): VITAMINB12, FOLATE, FERRITIN, TIBC, IRON, RETICCTPCT in the last 72 hours. Sepsis Labs: No results for input(s): PROCALCITON, LATICACIDVEN in the last 168 hours.  No results found for this or any previous visit (from the past 240 hour(s)).       Radiology Studies: IR GASTROSTOMY TUBE MOD SED  Result Date: 05/30/2021 INDICATION: CVA with left hemiparesis. Poor oral intake Feeding tube required for nutritional supplement EXAM: Attempted G-tube placement MEDICATIONS: Glucagon 1 mg IV ANESTHESIA/SEDATION: Versed 2 mg IV; Fentanyl 50 mcg IV Moderate Sedation Time:  45 minutes The patient was continuously monitored during the procedure by the interventional radiology nurse under my direct supervision. CONTRAST:  None FLUOROSCOPY TIME:  Fluoroscopy Time: 6 minutes 18 seconds (23 mGy). COMPLICATIONS: None immediate. PROCEDURE: Informed written consent was obtained from the patient's son after a thorough discussion of the procedural risks, benefits and alternatives. All questions were addressed. Maximal Sterile Barrier Technique was utilized including caps, mask, sterile gowns, sterile gloves, sterile drape, hand hygiene and skin antiseptic. A timeout was performed prior to the initiation of the procedure. Patient positioned supine on the procedure table. Orogastric tube inserted utilizing fluoroscopic guidance.  Stomach was insufflated. The liver edge was marked utilizing ultrasound. I could not be certain that the colon was out of the way. Rectal tube was inserted and barium enema was performed. The transverse colon obscured percutaneous window into the stomach. Procedure was terminated. IMPRESSION: No safe window for percutaneous gastrostomy tube placement due to interposed transverse colon. Electronically Signed   By: Miachel Roux M.D.   On: 05/30/2021 10:13   ECHOCARDIOGRAM COMPLETE  Result Date: 05/30/2021    ECHOCARDIOGRAM REPORT   Patient Name:   Amy [name] Tarnowski Date of Exam: 05/30/2021 Medical Rec #:  [id number]        Height:       67.0 in Accession #:    [id number]       Weight:       126.8 lb Date of Birth:  03/24/1946        BSA:          1.666 m Patient Age:    71 years         BP:           118/48 mmHg Patient Gender: F                HR:           102 bpm. Exam Location:  Inpatient Procedure: 2D Echo Indications:    stroke  History:        Patient has no prior history of Echocardiogram examinations. End                 stage renal disease; Risk Factors:Hypertension, Diabetes and                 Dyslipidemia.  Sonographer:    Johny Chess Referring PhysPX:[id number] [facility]  Sonographer Comments: Image acquisition challenging due to respiratory motion and unable to cooperate. IMPRESSIONS  1. Left ventricular ejection fraction, by estimation, is 70 to 75%. The left ventricle has hyperdynamic function. The left ventricle has no regional wall motion abnormalities. Left ventricular diastolic parameters are consistent with Grade I diastolic dysfunction (impaired relaxation).  2. Right ventricular systolic function is normal. The right ventricular size is normal. There is moderately elevated pulmonary artery systolic pressure.  3. The mitral valve is normal in structure. No evidence of mitral valve regurgitation.  4. The noncoronary cusp is calcified. . The aortic valve is tricuspid. There is mild  calcification of the aortic valve. Aortic valve regurgitation is not visualized. No aortic  stenosis is present. FINDINGS  Left Ventricle: Left ventricular ejection fraction, by estimation, is 70 to 75%. The left ventricle has hyperdynamic function. The left ventricle has no regional wall motion abnormalities. The left ventricular internal cavity size was small. There is no  left ventricular hypertrophy. Left ventricular diastolic parameters are consistent with Grade I diastolic dysfunction (impaired relaxation). Right Ventricle: The right ventricular size is normal. Right vetricular wall thickness was not well visualized. Right ventricular systolic function is normal. There is moderately elevated pulmonary artery systolic pressure. The tricuspid regurgitant velocity is 3.25 m/s, and with an assumed right atrial pressure of 3 mmHg, the estimated right ventricular systolic pressure is 123456 mmHg. Left Atrium: Left atrial size was normal in size. Right Atrium: Right atrial size was normal in size. Pericardium: There is no evidence of pericardial effusion. Mitral Valve: The mitral valve is normal in structure. No evidence of mitral valve regurgitation. Tricuspid Valve: The tricuspid valve is grossly normal. Tricuspid valve regurgitation is mild. Aortic Valve: The noncoronary cusp is calcified. The aortic valve is tricuspid. There is mild calcification of the aortic valve. Aortic valve regurgitation is not visualized. No aortic stenosis is present. Pulmonic Valve: The pulmonic valve was grossly normal. Pulmonic valve regurgitation is not visualized. Aorta: The aortic root and ascending aorta are structurally normal, with no evidence of dilitation. IAS/Shunts: The atrial septum is grossly normal.  LEFT VENTRICLE PLAX 2D LVIDd:         3.20 cm  Diastology LVIDs:         1.90 cm  LV e' medial:    9.14 cm/s LV PW:         1.00 cm  LV E/e' medial:  8.0 LV IVS:        0.90 cm  LV e' lateral:   11.20 cm/s LVOT diam:     1.80 cm   LV E/e' lateral: 6.5 LV SV:         37 LV SV Index:   22 LVOT Area:     2.54 cm  RIGHT VENTRICLE             IVC RV S prime:     12.20 cm/s  IVC diam: 1.30 cm TAPSE (M-mode): 1.3 cm LEFT ATRIUM             Index       RIGHT ATRIUM          Index LA diam:        2.70 cm 1.62 cm/m  RA Area:     7.66 cm LA Vol (A2C):   23.0 ml 13.81 ml/m RA Volume:   13.70 ml 8.22 ml/m LA Vol (A4C):   18.0 ml 10.80 ml/m LA Biplane Vol: 20.4 ml 12.25 ml/m  AORTIC VALVE LVOT Vmax:   91.60 cm/s LVOT Vmean:  60.800 cm/s LVOT VTI:    0.144 m  AORTA Ao Root diam: 2.60 cm Ao Asc diam:  2.40 cm MITRAL VALVE                TRICUSPID VALVE MV Area (PHT): 6.54 cm     TR Peak grad:   42.2 mmHg MV Decel Time: 116 msec     TR Vmax:        325.00 cm/s MV E velocity: 73.10 cm/s MV A velocity: 124.00 cm/s  SHUNTS MV E/A ratio:  0.59         Systemic VTI:  0.14 m  Systemic Diam: 1.80 cm Mertie Moores MD Electronically signed by Mertie Moores MD Signature Date/Time: 05/30/2021/10:57:41 AM    Final         Scheduled Meds:   stroke: mapping our early stages of recovery book   Does not apply Once   (feeding supplement) PROSource Plus  30 mL Oral BID BM   aspirin EC  81 mg Oral Daily   atorvastatin  40 mg Oral Daily   Chlorhexidine Gluconate Cloth  6 each Topical Q0600   collagenase   Topical Daily   darbepoetin (ARANESP) injection - DIALYSIS  100 mcg Intravenous Q Thu-HD   feeding supplement (NEPRO CARB STEADY)  237 mL Oral TID BM   gabapentin  100 mg Oral QHS   heparin injection (subcutaneous)  5,000 Units Subcutaneous Q8H   midodrine  10 mg Oral TID   mirtazapine  7.5 mg Oral QHS   multivitamin  1 tablet Oral QHS   pantoprazole  40 mg Oral Q0600   sodium chloride flush  3 mL Intravenous Q12H   [START ON 06/03/2021] thiamine injection  100 mg Intravenous Daily   venlafaxine XR  75 mg Oral Q breakfast   Continuous Infusions:  sodium chloride     cefTRIAXone (ROCEPHIN)  IV 1 g (05/29/21 2201)    thiamine injection 250 mg (05/30/21 1331)     LOS: 10 days    Time spent: 45 minutes spent on chart review, discussion with nursing staff, consultants, updating family and interview/physical exam; more than 50% of that time was spent in counseling and/or coordination of care.    Chavez Rosol J [place] DO [facility] Available via Epic secure chat 7am-7pm After these hours, please refer to coverage provider listed on [email or web address] 05/30/2021, 2:03 PM

## 2021-05-30 NOTE — Progress Notes (Addendum)
Calorie Count Note  48 hour calorie count ordered.  Diet: dysphagia 3 with thin liquids  Supplements: Nepro TID, Magic Cup TID  Discussed pt with RN. Pt has had very poor po intake over the last 48 hours. Over the 1st day of the calorie count, pt ate 0% of both breakfast and lunch and then ate only 25% of dinner (provided 135 kcals and 5 grams protein). Pt ate 0% of all meals on the 2nd day of the calorie count. RN provided documentation of pt consuming 90% of her Nepro shake on 8/3 and then 80% of a Nepro shake the following day.   Day 1 results (8/3) Breakfast: 0 kcals 0 grams protein Lunch: 0 kcals 0 grams protein Dinner: 135 kcals 5 grams protein Supplements: 383 kcals 17 grams protein  Total intake: 518 kcal (26% of minimum estimated needs)  22 grams protein (31% of minimum estimated needs)  Day 2 results (8/4) Breakfast: 0 kcals 0 grams protein Lunch: 0 kcals 0 grams protein Dinner: 0 kcals 0 grams protein Supplements: 340 kcals 15 grams protein  Total intake: 340 kcal (17% of minimum estimated needs)  15 grams protein (21% of minimum estimated needs)  Nutrition Dx: Severe Malnutrition related to chronic illness (ESRD on HD, prior CVA) as evidenced by percent weight loss, severe muscle depletion, energy intake < or equal to 75% for > or equal to 1 month. -- ongoing  Goal: Patient will meet greater than or equal to 90% of their needs -- not met  Intervention:  Recommend placement of PEG for initiation of nutrition support given continued inadequate po intake. Consider: -Osmolite 1.5 @ goal rate of 36m/hr (13287m Provides 1980 kcals, 82 grams protein, 100541mree water  -continue Nepro Shake po TID, each supplement provides 425 kcal and 19 grams protein -continue Magic cup TID with meals, each supplement provides 290 kcal and 9 grams of protein -continue meal assistance -continue renal mvi  Addendum: Cortrak order placed for pt as IR unable to place PEG and  surgery/PMT not able to evaluate pt until Monday. Once Cortrak is placed and ready for use, begin the following: -Osmolite 1.5 @ 64m47m increase by 10ml70mQ8H until goal rate of 55ml/61m1320ml) 84meached -150ml fr44mater Q4H Provides 1980 kcals, 82 grams protein, 1005ml fre38mter (1905ml tota58mee water with flushes)  Monitor magnesium, potassium, and phosphorus daily for at least 3 days, MD to replete as needed, as pt is at risk for refeeding syndrome    Yanely Mast AveLarkin InaLDN (she/her/hers) RD pager number and weekend/on-call pager number located in [email or web address]

## 2021-05-30 NOTE — Progress Notes (Addendum)
Subjective: Woken from sleep pleasantly confused thinks today is Thursday forgot about dialysis yesterday, no other complaints  Objective Vital normoactive soft nontender nondistended in last 24 hours: Vitals:   05/29/21 1715 05/29/21 1809 05/29/21 2109 05/30/21 0510  BP: 109/80 119/71 (!) 104/51 (!) 118/48  Pulse: 97 (!) 101 96 98  Resp: '11 19 16 18  '$ Temp:  (!) 97.5 F (36.4 C) 98.3 F (36.8 C) 97.9 F (36.6 C)  TempSrc:  Oral  Oral  SpO2: 100%   100%  Weight:   57.5 kg   Height:       Weight change:   Physical Exam: General: Thin chronically ill-appearing female in NAD Heart: RRR no MRG Lungs: CTA , Nonlabored breathing Abdomen: Bowel sounds positive no acute, soft nontender nondistended Extremities: No lower extremity edema, left upper arm swelling, nontender  dialysis Access: Left AVG positive bruit, with upper arm swelling     Dialysis Orders:  AF MWF= now in hospital TTS 3h 32mn 400/500    62.5kg    3K/2.5 Ca    P2  AVG HeRO   Hep none - Mircera 50 q 2 wks  (7/13)  Venofer 50 q wk - Parsabiv '5mg'$  IV q HD   Problem/Plan: Melena/Rectal bleeding - GI consulted. Hx rectal ulcers.  EGD 7/27 normal / flex sig =mucosal ulceration c/w sterocoral ulcers, non bleeding.  hgb low but stable 8.4 on 8/04 AMS: Palliative care consulted to establish goals of care. Pt is now DNR,  no colonscopy for now.  Per G-tube placement yesterday and IR noted "no safe window identified for percutaneous access to stomach due to overlying transverse colon "when brought for G-tube placement . Need to get palliative back involved because noted by Dr. 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 AND-  as noted by Dr. GMoshe Cipro"have said  before really dont think she is a candidate for OP dialysis any longer, too weak to sit up-  confused intermittently -  at high risk to dislodge her needles-  is safety issue.  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  Hypertension/volume  - BP soft. On midodrine for BP support.  Edema improving, UF as tolerated.  Under edw if weights correct.  No excess volume on exam Anemia  - Hgb trending down, last 8.5. Aranesp to1067m IV q Thursday. Metabolic bone disease -  Corr Ca okay and phos last on 7/3 1 low at 1.7 no phosphorus binders currently, phosphorus recheck with dialysis tomorrow before giving any replacement  Nutrition: Albumin 2.4 (8/02 )poorly eating per notes.Albumin low. Continue protein supps.  Noted yesterday "no safe window for G-tube placement per IR, further plans per admit team Recent CVA with no evidence of meaningful recovery.   DaErnest HaberPA-C CaWestern [facility] Bee[facility] [phone]/02/2021,10:17 AM  LOS: 10 days   Labs: Basic Metabolic Panel: Recent Labs  Lab 05/25/21 1241 05/26/21 0334 05/27/21 0531 05/29/21 0425  NA  --  136 136 136  K  --  3.5 3.7 4.0  CL  --  98 97* 100  CO2  --  '27 26 26  '$ GLUCOSE  --  106* 151* 166*  BUN  --  19 12 25*  CREATININE  --  5.62* 3.75* 5.58*  CALCIUM  --  8.1* 8.3* 8.0*  PHOS 1.7*  --   --   --    Liver Function Tests: Recent Labs  Lab 05/27/21 0531  AST 34  ALT 26  ALKPHOS 78  BILITOT 0.8  PROT 6.4*  ALBUMIN 2.4*   No results for input(s): LIPASE, AMYLASE in the last 168 hours. Recent Labs  Lab 05/25/21 2022 05/27/21 0531  AMMONIA 61* 11   CBC: Recent Labs  Lab 05/24/21 1256 05/26/21 0334 05/27/21 0531 05/29/21 0425  WBC 8.9 10.5 11.9* 14.3*  NEUTROABS  --  7.8* 9.3*  --   HGB 8.8* 8.4* 8.8* 8.5*  HCT 29.0* 26.0* 28.1*  26.6*  MCV 99.3 95.9 98.3 97.8  PLT 209 216 203 228   Cardiac Enzymes: No results for input(s): CKTOTAL, CKMB, CKMBINDEX, TROPONINI in the last 168 hours. CBG: Recent Labs  Lab 05/28/21 1701 05/28/21 2048 05/29/21 0623 05/29/21 1840 05/29/21 2111  GLUCAP 90 97 158* 127* 118*    Studies/Results: IR GASTROSTOMY TUBE MOD SED  Result Date: 05/30/2021 INDICATION: CVA with left hemiparesis. Poor oral intake Feeding tube required for nutritional supplement EXAM: Attempted G-tube placement MEDICATIONS: Glucagon 1 mg IV ANESTHESIA/SEDATION: Versed 2 mg IV; Fentanyl 50 mcg IV Moderate Sedation Time:  45 minutes The patient was continuously monitored during the procedure by the interventional radiology nurse under my direct supervision. CONTRAST:  None FLUOROSCOPY TIME:  Fluoroscopy Time: 6 minutes 18 seconds (23 mGy). COMPLICATIONS: None immediate. PROCEDURE: Informed written consent was obtained from the patient's son after a thorough discussion of the procedural risks, benefits and alternatives. All questions were addressed. Maximal Sterile Barrier Technique was utilized including caps, mask, sterile gowns, sterile gloves, sterile drape, hand hygiene and skin antiseptic. A timeout was performed prior to the initiation of the procedure. Patient positioned supine on the procedure table. Orogastric tube inserted utilizing fluoroscopic guidance. Stomach was insufflated. The liver edge was marked utilizing ultrasound. I could not be certain that the colon was out of the way. Rectal tube was inserted and barium enema was performed. The transverse colon obscured percutaneous window into the stomach. Procedure was terminated. IMPRESSION: No safe window for percutaneous gastrostomy tube placement due to interposed transverse colon. Electronically Signed   By: Miachel Roux M.D.   On: 05/30/2021 10:13   VAS US CAROTID  Result Date: 05/28/2021 Carotid Arterial Duplex Study Patient Name:  Amy [name] Treml  Date of  Exam:   05/28/2021 Medical Rec #: [id number]         Accession #:    [id number] Date of Birth: 07-09-1946         Patient Gender: F Patient Age:   75Y Exam Location:  [facility] [place] Procedure:      VAS US CAROTID Referring Phys: [id number] [place] --------------------------------------------------------------------------------  Indications:       CVA. Risk Factors:      Hypertension, hyperlipidemia, Diabetes. Comparison Study:  no prior Performing Technologist: Archie Patten RVS  Examination Guidelines: A complete evaluation includes B-mode imaging, spectral Doppler, color Doppler, and power Doppler as needed of all accessible portions of each vessel. Bilateral testing is considered an integral part of a complete examination. Limited examinations for reoccurring indications may be performed as noted.  Right Carotid Findings: +----------+--------+--------+--------+------------------+--------+  PSV cm/sEDV cm/sStenosisPlaque DescriptionComments +----------+--------+--------+--------+------------------+--------+ CCA Prox  83      8               heterogenous               +----------+--------+--------+--------+------------------+--------+ CCA Distal62      7               heterogenous               +----------+--------+--------+--------+------------------+--------+ ICA Prox  80      13      1-39%   heterogenous               +----------+--------+--------+--------+------------------+--------+ ICA Distal71      16                                         +----------+--------+--------+--------+------------------+--------+ ECA       94                                                 +----------+--------+--------+--------+------------------+--------+ +----------+--------+-------+--------+-------------------+           PSV cm/sEDV cmsDescribeArm Pressure (mmHG) +----------+--------+-------+--------+-------------------+ [id number]                                          +----------+--------+-------+--------+-------------------+ +---------+--------+--+--------+-+---------+ VertebralPSV cm/s61EDV cm/s7Antegrade +---------+--------+--+--------+-+---------+  Left Carotid Findings: +----------+--------+--------+--------+------------------+--------+           PSV cm/sEDV cm/sStenosisPlaque DescriptionComments +----------+--------+--------+--------+------------------+--------+ CCA Prox  73      8               heterogenous               +----------+--------+--------+--------+------------------+--------+ CCA Distal49      11              heterogenous               +----------+--------+--------+--------+------------------+--------+ ICA Prox  68      16      1-39%   heterogenous               +----------+--------+--------+--------+------------------+--------+ ICA Distal106     21                                         +----------+--------+--------+--------+------------------+--------+ ECA       35                                                 +----------+--------+--------+--------+------------------+--------+ +----------+--------+--------+--------+-------------------+           PSV cm/sEDV cm/sDescribeArm Pressure (mmHG) +----------+--------+--------+--------+-------------------+ Subclavian                fistula                     +----------+--------+--------+--------+-------------------+ +---------+--------+--+--------+-+---------+ VertebralPSV cm/s52EDV cm/s9Antegrade +---------+--------+--+--------+-+---------+   Summary: Right Carotid: Velocities in the right ICA are consistent with a 1-39% stenosis.  Left Carotid: Velocities in the left ICA are consistent with a 1-39% stenosis. Vertebrals: Bilateral vertebral arteries demonstrate antegrade flow. *See table(s) above for measurements and observations.  Electronically signed by Antony Contras MD on 05/28/2021 at 1:04:48 PM.    Final    Medications:   sodium chloride     cefTRIAXone (ROCEPHIN)  IV 1 g (05/29/21 2201)   thiamine injection 250 mg (05/29/21 1827)     stroke: mapping our early stages of recovery book   Does not apply Once   (feeding supplement) PROSource Plus  30 mL Oral BID BM   aspirin EC  81 mg Oral Daily   atorvastatin  40 mg Oral Daily   Chlorhexidine Gluconate Cloth  6 each Topical Q0600   darbepoetin (ARANESP) injection - DIALYSIS  100 mcg Intravenous Q Thu-HD   feeding supplement (NEPRO CARB STEADY)  237 mL Oral TID BM   gabapentin  100 mg Oral QHS   heparin injection (subcutaneous)  5,000 Units Subcutaneous Q8H   midodrine  10 mg Oral TID   mirtazapine  7.5 mg Oral QHS   multivitamin  1 tablet Oral QHS   pantoprazole  40 mg Oral Q0600   sodium chloride flush  3 mL Intravenous Q12H   [START ON 06/03/2021] thiamine injection  100 mg Intravenous Daily   venlafaxine XR  75 mg Oral Q breakfast     I have seen and examined this patient and agree with plan and assessment in the above note with renal recommendations/intervention highlighted.  Consult to Palliative care pending evaluation.  Agree that she is not presently a suitable candidate for outpatient dialysis given poor functional, nutritional, and cognitive status Governor Rooks Daemyn Gariepy,MD 05/30/2021 12:57 PM

## 2021-05-30 NOTE — Progress Notes (Signed)
  Echocardiogram 2D Echocardiogram has been performed.  Amy [name] 05/30/2021, 8:52 AM

## 2021-05-30 NOTE — Consult Note (Signed)
WOC Nurse Consult Note: Patient receiving care in [facility] [phone]. Reason for Consult: buttock wound Wound type: Unstageable PI to left buttock surrounded by stage 2.  Stage 3 PI found at coccyx Pressure Injury POA: Yes for both Measurement: Left buttock total wound site measures 7.5 cm x 5 cm x unknown depth. Coccyx wound measures 1.5 cm x 1 cm x 0.3 cm Wound bed: buttock wound is grey unstageable surrounded by pink tissue. Coccyx wound is 100% pink. Drainage (amount, consistency, odor) none Periwound: intact Dressing procedure/placement/frequency: Apply to left buttock wound. Cover with a Xeroform gauze, then foam dressing. Change daily.  For the coccyx, a small piece of xeroform and foam dressing daily.  Monitor the wound area(s) for worsening of condition such as: Signs/symptoms of infection,  Increase in size,  Development of or worsening of odor, Development of pain, or increased pain at the affected locations.  Notify the medical team if any of these develop.  Thank you for the consult.  Discussed plan of care with the patient.  [place] nurse will not follow at this time.  Please re-consult the [place] team if needed.  Val Riles, RN, MSN, CWOCN, CNS-BC, pager [phone]

## 2021-05-30 NOTE — Progress Notes (Signed)
SLP Cancellation Note  Patient Details Name: Amy [name] MRN: [id number] DOB: 1946-06-24   Cancelled treatment:       Reason Eval/Treat Not Completed: Medical issues which prohibited therapy (Pt NPO for possible procedure.)  Stacey Sago I. Hardin Negus, [place], [place] Office number [phone] Pager [phone]  Horton Marshall 05/30/2021, 5:31 PM

## 2021-05-30 NOTE — Consult Note (Signed)
Reason for Consult:G tube placement Referring Physician: E. [place]  Amy [name] is an 75 y.o. female.  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 She underwent W/U by GI which found some rectal ulcers. Colonoscopy was offered but declined by the family. She has poor PO intake and IR tried to place a G tube but could not get a safe window. We are consulted for open G tube. She is confused and cannot give any history.  Past Medical History:  Diagnosis Date   Chronic kidney disease    dialysis M-W-F - dialysis cath located in right side of chest   Diabetes mellitus without complication ([place])    type 2 - no meds    HLD (hyperlipidemia)    diet controlled   Hypertension    no meds    Past Surgical History:  Procedure Laterality Date   A/V FISTULAGRAM Left 01/10/2021   Procedure: A/V FISTULAGRAM;  Surgeon: Angelia Mould, MD;  Location: [facility];  Service: Cardiovascular;  Laterality: Left;   A/V FISTULAGRAM N/A 01/27/2021   Procedure: A/V FISTULAGRAM;  Surgeon: Waynetta Sandy, MD;  Location: [facility];  Service: Cardiovascular;  Laterality: N/A;   AV FISTULA PLACEMENT Left 09/24/2020   Procedure: LEFT UPPER ARM ARTERIOVENOUS GORTEX  GRAFT;  Surgeon: Elam Dutch, MD;  Location: [facility];  Service: Vascular;  Laterality: Left;   BREAST CYST EXCISION Right 2011   cyst removed -neg   ESOPHAGOGASTRODUODENOSCOPY (EGD) WITH PROPOFOL N/A 05/21/2021   Procedure: ESOPHAGOGASTRODUODENOSCOPY (EGD) WITH PROPOFOL;  Surgeon: Jackquline Denmark, MD;  Location: [facility] ENDOSCOPY;  Service: Endoscopy;  Laterality: N/A;   EYE SURGERY Bilateral    cataracts   FLEXIBLE SIGMOIDOSCOPY N/A 05/21/2021   Procedure: FLEXIBLE SIGMOIDOSCOPY;  Surgeon: Jackquline Denmark, MD;  Location: [facility] ENDOSCOPY;  Service: Endoscopy;  Laterality: N/A;   INSERTION OF  DIALYSIS CATHETER Right 07/2019   IR GASTROSTOMY TUBE MOD SED  05/29/2021   PERIPHERAL VASCULAR BALLOON ANGIOPLASTY  01/10/2021   Procedure: PERIPHERAL VASCULAR BALLOON ANGIOPLASTY;  Surgeon: Angelia Mould, MD;  Location: [facility];  Service: Cardiovascular;;  left AV graft   TUBAL LIGATION     Ultrasound-guided cannulation left upper arm AV graft  01/27/2021    Family History  Problem Relation Age of Onset   Breast cancer Neg Hx     Social History:  reports that she has quit smoking. She has never used smokeless tobacco. She reports current alcohol use. She reports previous drug use. Drug: Marijuana.  Allergies:  Allergies  Allergen Reactions   Sensipar [Cinacalcet]     Per MAR   Statins     Per MAR    Medications: I have reviewed the patient's current medications.  Results for orders placed or performed during the hospital encounter of 05/19/21 (from the past 48 hour(s))  Glucose, capillary     Status: None   Collection Time: 05/28/21  5:01 PM  Result Value Ref Range   Glucose-Capillary 90 70 - 99 mg/dL    Comment: Glucose reference range applies only to samples taken after fasting for at least 8 hours.  Glucose, capillary     Status: None   Collection Time: 05/28/21  8:48 PM  Result Value Ref Range   Glucose-Capillary 97 70 - 99 mg/dL    Comment:  Glucose reference range applies only to samples taken after fasting for at least 8 hours.   Comment 1 Notify RN    Comment 2 Document in Chart   CBC     Status: Abnormal   Collection Time: 05/29/21  4:25 AM  Result Value Ref Range   WBC 14.3 (H) 4.0 - 10.5 K/uL   RBC 2.72 (L) 3.87 - 5.11 MIL/uL   Hemoglobin 8.5 (L) 12.0 - 15.0 g/dL   HCT 26.6 (L) 36.0 - 46.0 %   MCV 97.8 80.0 - 100.0 fL   MCH 31.3 26.0 - 34.0 pg   MCHC 32.0 30.0 - 36.0 g/dL   RDW 20.0 (H) 11.5 - 15.5 %   Platelets 228 150 - 400 K/uL   nRBC 0.0 0.0 - 0.2 %    Comment: Performed at [facility], [place] [address], [place], [place]  [id number]  Basic metabolic panel     Status: Abnormal   Collection Time: 05/29/21  4:25 AM  Result Value Ref Range   Sodium 136 135 - 145 mmol/L   Potassium 4.0 3.5 - 5.1 mmol/L   Chloride 100 98 - 111 mmol/L   CO2 26 22 - 32 mmol/L   Glucose, Bld 166 (H) 70 - 99 mg/dL    Comment: Glucose reference range applies only to samples taken after fasting for at least 8 hours.   BUN 25 (H) 8 - 23 mg/dL   Creatinine, Ser 5.58 (H) 0.44 - 1.00 mg/dL   Calcium 8.0 (L) 8.9 - 10.3 mg/dL   GFR, Estimated 8 (L) >60 mL/min    Comment: (NOTE) Calculated using the CKD-EPI Creatinine Equation (2021)    Anion gap 10 5 - 15    Comment: Performed at [facility] [address] [place], [place] [address]  Glucose, capillary     Status: Abnormal   Collection Time: 05/29/21  6:23 AM  Result Value Ref Range   Glucose-Capillary 158 (H) 70 - 99 mg/dL    Comment: Glucose reference range applies only to samples taken after fasting for at least 8 hours.   Comment 1 Notify RN    Comment 2 Document in Chart   Hepatitis B surface antigen     Status: None   Collection Time: 05/29/21 10:07 AM  Result Value Ref Range   Hepatitis B Surface Ag NON REACTIVE NON REACTIVE    Comment: Performed at [facility] [address], [place], [place] [address]  Glucose, capillary     Status: Abnormal   Collection Time: 05/29/21  6:40 PM  Result Value Ref Range   Glucose-Capillary 127 (H) 70 - 99 mg/dL    Comment: Glucose reference range applies only to samples taken after fasting for at least 8 hours.  Glucose, capillary     Status: Abnormal   Collection Time: 05/29/21  9:11 PM  Result Value Ref Range   Glucose-Capillary 118 (H) 70 - 99 mg/dL    Comment: Glucose reference range applies only to samples taken after fasting for at least 8 hours.  Glucose, capillary     Status: Abnormal   Collection Time: 05/30/21 11:54 AM  Result Value Ref Range   Glucose-Capillary 109 (H) 70 - 99 mg/dL    Comment: Glucose  reference range applies only to samples taken after fasting for at least 8 hours.    IR GASTROSTOMY TUBE MOD SED  Result Date: 05/30/2021 INDICATION: CVA with left hemiparesis. Poor oral intake Feeding tube required  for nutritional supplement EXAM: Attempted G-tube placement MEDICATIONS: Glucagon 1 mg IV ANESTHESIA/SEDATION: Versed 2 mg IV; Fentanyl 50 mcg IV Moderate Sedation Time:  45 minutes The patient was continuously monitored during the procedure by the interventional radiology nurse under my direct supervision. CONTRAST:  None FLUOROSCOPY TIME:  Fluoroscopy Time: 6 minutes 18 seconds (23 mGy). COMPLICATIONS: None immediate. PROCEDURE: Informed written consent was obtained from the patient's son after a thorough discussion of the procedural risks, benefits and alternatives. All questions were addressed. Maximal Sterile Barrier Technique was utilized including caps, mask, sterile gowns, sterile gloves, sterile drape, hand hygiene and skin antiseptic. A timeout was performed prior to the initiation of the procedure. Patient positioned supine on the procedure table. Orogastric tube inserted utilizing fluoroscopic guidance. Stomach was insufflated. The liver edge was marked utilizing ultrasound. I could not be certain that the colon was out of the way. Rectal tube was inserted and barium enema was performed. The transverse colon obscured percutaneous window into the stomach. Procedure was terminated. IMPRESSION: No safe window for percutaneous gastrostomy tube placement due to interposed transverse colon. Electronically Signed   By: Miachel Roux M.D.   On: 05/30/2021 10:13   ECHOCARDIOGRAM COMPLETE  Result Date: 05/30/2021    ECHOCARDIOGRAM REPORT   Patient Name:   Amy [name] Date of Exam: 05/30/2021 Medical Rec #:  [id number]        Height:       67.0 in Accession #:    [id number]       Weight:       126.8 lb Date of Birth:  03/15/1946        BSA:          1.666 m Patient Age:    56 years         BP:            118/48 mmHg Patient Gender: F                HR:           102 bpm. Exam Location:  Inpatient Procedure: 2D Echo Indications:    stroke  History:        Patient has no prior history of Echocardiogram examinations. End                 stage renal disease; Risk Factors:Hypertension, Diabetes and                 Dyslipidemia.  Sonographer:    Johny Chess Referring PhysPX:[id number] [facility]  Sonographer Comments: Image acquisition challenging due to respiratory motion and unable to cooperate. IMPRESSIONS  1. Left ventricular ejection fraction, by estimation, is 70 to 75%. The left ventricle has hyperdynamic function. The left ventricle has no regional wall motion abnormalities. Left ventricular diastolic parameters are consistent with Grade I diastolic dysfunction (impaired relaxation).  2. Right ventricular systolic function is normal. The right ventricular size is normal. There is moderately elevated pulmonary artery systolic pressure.  3. The mitral valve is normal in structure. No evidence of mitral valve regurgitation.  4. The noncoronary cusp is calcified. . The aortic valve is tricuspid. There is mild calcification of the aortic valve. Aortic valve regurgitation is not visualized. No aortic stenosis is present. FINDINGS  Left Ventricle: Left ventricular ejection fraction, by estimation, is 70 to 75%. The left ventricle has hyperdynamic function. The left ventricle has no regional wall motion abnormalities. The left ventricular internal cavity size was small. There is no  left ventricular hypertrophy. Left ventricular diastolic parameters are consistent with Grade I diastolic dysfunction (impaired relaxation). Right Ventricle: The right ventricular size is normal. Right vetricular wall thickness was not well visualized. Right ventricular systolic function is normal. There is moderately elevated pulmonary artery systolic pressure. The tricuspid regurgitant velocity is 3.25 m/s, and with an  assumed right atrial pressure of 3 mmHg, the estimated right ventricular systolic pressure is 123456 mmHg. Left Atrium: Left atrial size was normal in size. Right Atrium: Right atrial size was normal in size. Pericardium: There is no evidence of pericardial effusion. Mitral Valve: The mitral valve is normal in structure. No evidence of mitral valve regurgitation. Tricuspid Valve: The tricuspid valve is grossly normal. Tricuspid valve regurgitation is mild. Aortic Valve: The noncoronary cusp is calcified. The aortic valve is tricuspid. There is mild calcification of the aortic valve. Aortic valve regurgitation is not visualized. No aortic stenosis is present. Pulmonic Valve: The pulmonic valve was grossly normal. Pulmonic valve regurgitation is not visualized. Aorta: The aortic root and ascending aorta are structurally normal, with no evidence of dilitation. IAS/Shunts: The atrial septum is grossly normal.  LEFT VENTRICLE PLAX 2D LVIDd:         3.20 cm  Diastology LVIDs:         1.90 cm  LV e' medial:    9.14 cm/s LV PW:         1.00 cm  LV E/e' medial:  8.0 LV IVS:        0.90 cm  LV e' lateral:   11.20 cm/s LVOT diam:     1.80 cm  LV E/e' lateral: 6.5 LV SV:         37 LV SV Index:   22 LVOT Area:     2.54 cm  RIGHT VENTRICLE             IVC RV S prime:     12.20 cm/s  IVC diam: 1.30 cm TAPSE (M-mode): 1.3 cm LEFT ATRIUM             Index       RIGHT ATRIUM          Index LA diam:        2.70 cm 1.62 cm/m  RA Area:     7.66 cm LA Vol (A2C):   23.0 ml 13.81 ml/m RA Volume:   13.70 ml 8.22 ml/m LA Vol (A4C):   18.0 ml 10.80 ml/m LA Biplane Vol: 20.4 ml 12.25 ml/m  AORTIC VALVE LVOT Vmax:   91.60 cm/s LVOT Vmean:  60.800 cm/s LVOT VTI:    0.144 m  AORTA Ao Root diam: 2.60 cm Ao Asc diam:  2.40 cm MITRAL VALVE                TRICUSPID VALVE MV Area (PHT): 6.54 cm     TR Peak grad:   42.2 mmHg MV Decel Time: 116 msec     TR Vmax:        325.00 cm/s MV E velocity: 73.10 cm/s MV A velocity: 124.00 cm/s  SHUNTS MV  E/A ratio:  0.59         Systemic VTI:  0.14 m                             Systemic Diam: 1.80 cm Mertie Moores MD Electronically signed by Mertie Moores MD Signature Date/Time: 05/30/2021/10:57:41 AM    Final  Review of Systems  Unable to perform ROS: Dementia  Blood pressure (!) 111/48, pulse 94, temperature 98.5 F (36.9 C), temperature source Oral, resp. rate 18, height '5\' 7"'$  (1.702 m), weight 57.5 kg, SpO2 100 %. Physical Exam Constitutional:      General: She is not in acute distress. HENT:     Head: Normocephalic.     Mouth/Throat:     Mouth: Mucous membranes are moist.  Cardiovascular:     Rate and Rhythm: Normal rate and regular rhythm.  Pulmonary:     Effort: Pulmonary effort is normal.     Breath sounds: Normal breath sounds.  Abdominal:     General: Abdomen is flat.     Palpations: Abdomen is soft.     Tenderness: There is no abdominal tenderness. There is no guarding or rebound.  Musculoskeletal:        General: No tenderness.     Cervical back: Normal range of motion.  Skin:    General: Skin is warm.  Neurological:     Mental Status: She is alert.     Comments: Confused and disoriented    Assessment/Plan: Poor PO intake - Noted documentation by Nephrology that she is not a good candidate for further outpatient HD. Feeding access would require an operation for open gastrostomy tube placement. If she will not be continuing HD after D/C, this seems futile. She is unable to participate in any discussion of plans. Agree with recommendation for further Palliative Care discussions. We will re-assess Monday.  Zenovia Jarred 05/30/2021, 12:43 PM

## 2021-05-30 NOTE — TOC Progression Note (Addendum)
Transition of Care [facility]) - Progression Note    Patient Details  Name: Amy [name] MRN: [id number] Date of Birth: 08-26-1946  Transition of Care [facility]) CM/SW [place], RN Phone Number: 05/30/2021, 1:36 PM  Clinical Narrativ  Palliative care has re-seen the patient and  dialysis is continuing.Patient still remains a poor candidate for outpatient dialysis, will have to be transported, sit in a recliner for treatments, acccording to nephrology. High risk of re-admission noted.    Expected Discharge Plan: [place] Barriers to Discharge: Continued Medical Work up  Expected Discharge Plan and Services Expected Discharge Plan: [place] In-house Referral: Clinical Social Work     Living arrangements for the past 2 months: [place] (Patient came to hospital from [place])                                       Social Determinants of Health (SDOH) Interventions    Readmission Risk Interventions No flowsheet data found.

## 2021-05-31 DIAGNOSIS — E785 Hyperlipidemia, unspecified: Secondary | ICD-10-CM | POA: Diagnosis not present

## 2021-05-31 DIAGNOSIS — D638 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: Secondary | ICD-10-CM | POA: Diagnosis not present

## 2021-05-31 DIAGNOSIS — K922 Gastrointestinal hemorrhage, unspecified: Secondary | ICD-10-CM | POA: Diagnosis not present

## 2021-05-31 DIAGNOSIS — I6330 Cerebral infarction due to thrombosis of unspecified cerebral artery: Secondary | ICD-10-CM | POA: Diagnosis not present

## 2021-05-31 LAB — GLUCOSE, CAPILLARY
Glucose-Capillary: 88 mg/dL (ref 70–99)
Glucose-Capillary: 75 mg/dL (ref 70–99)
Glucose-Capillary: 72 mg/dL (ref 70–99)

## 2021-05-31 LAB — CBC
HCT: 25.9 % — ABNORMAL LOW (ref 36.0–46.0)
Hemoglobin: 8.2 g/dL — ABNORMAL LOW (ref 12.0–15.0)
MCH: 30.7 pg (ref 26.0–34.0)
MCHC: 31.7 g/dL (ref 30.0–36.0)
MCV: 97 fL (ref 80.0–100.0)
Platelets: 199 10*3/uL (ref 150–400)
RBC: 2.67 MIL/uL — ABNORMAL LOW (ref 3.87–5.11)
RDW: 19.4 % — ABNORMAL HIGH (ref 11.5–15.5)
WBC: 10.8 10*3/uL — ABNORMAL HIGH (ref 4.0–10.5)
nRBC: 0 % (ref 0.0–0.2)

## 2021-05-31 LAB — RENAL FUNCTION PANEL
Albumin: 2.6 g/dL — ABNORMAL LOW (ref 3.5–5.0)
Anion gap: 11 (ref 5–15)
BUN: 8 mg/dL (ref 8–23)
CO2: 27 mmol/L (ref 22–32)
Calcium: 7.8 mg/dL — ABNORMAL LOW (ref 8.9–10.3)
Chloride: 96 mmol/L — ABNORMAL LOW (ref 98–111)
Creatinine, Ser: 2.77 mg/dL — ABNORMAL HIGH (ref 0.44–1.00)
GFR, Estimated: 18 mL/min — ABNORMAL LOW (ref >60)
Glucose, Bld: 73 mg/dL (ref 70–99)
Phosphorus: 1.2 mg/dL — ABNORMAL LOW (ref 2.5–4.6)
Potassium: 3.3 mmol/L — ABNORMAL LOW (ref 3.5–5.1)
Sodium: 134 mmol/L — ABNORMAL LOW (ref 135–145)

## 2021-05-31 LAB — MAGNESIUM
Magnesium: 1.9 mg/dL (ref 1.7–2.4)
Magnesium: 1.8 mg/dL (ref 1.7–2.4)

## 2021-05-31 LAB — PHOSPHORUS
Phosphorus: 1.9 mg/dL — ABNORMAL LOW (ref 2.5–4.6)
Phosphorus: 1.2 mg/dL — ABNORMAL LOW (ref 2.5–4.6)

## 2021-05-31 MED ORDER — PENTAFLUOROPROP-TETRAFLUOROETH EX AERO: 1.0000 "application " | INHALATION_SPRAY | CUTANEOUS | Status: DC | PRN

## 2021-05-31 MED ORDER — ALBUMIN HUMAN 25 % IV SOLN
INTRAVENOUS | Status: AC
  Filled 2021-05-31: qty 100

## 2021-05-31 MED ORDER — HEPARIN SODIUM (PORCINE) 1000 UNIT/ML DIALYSIS: 1000.0000 [IU] | INTRAMUSCULAR | Status: DC | PRN

## 2021-05-31 MED ORDER — SODIUM CHLORIDE 0.9 % IV SOLN: 100.0000 mL | INTRAVENOUS | Status: DC | PRN

## 2021-05-31 MED ORDER — LIDOCAINE HCL (PF) 1 % IJ SOLN: 5.0000 mL | INTRAMUSCULAR | Status: DC | PRN

## 2021-05-31 MED ORDER — LIDOCAINE-PRILOCAINE 2.5-2.5 % EX CREA: 1.0000 "application " | TOPICAL_CREAM | CUTANEOUS | Status: DC | PRN

## 2021-05-31 MED ORDER — ALBUMIN HUMAN 25 % IV SOLN
12.5000 g | Freq: Once | INTRAVENOUS | Status: AC
  Administered 2021-05-31: 12.5 g via INTRAVENOUS

## 2021-05-31 MED ORDER — ALTEPLASE 2 MG IJ SOLR: 2.0000 mg | Freq: Once | INTRAMUSCULAR | Status: DC | PRN

## 2021-05-31 NOTE — Progress Notes (Signed)
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  Hemoccult was noted to be positive.  Complicated by use of anticoagulants with Eliquis.  Recent admission to [facility] on 5/19 through 04/22/21 with flex sig on 6/1 with multiple rectal ulcers likely secondary to trauma from enemas and constipation.  In the ED, afebrile, bp: 126/88, HR: 55, RR: 18 oxygen: 94% room air. Pertinent labs: Potassium: 2.8, Sodium: 132, BUN 18 and creatinine 5.30 Fecal occult: positive , Hgb: 9.6 (9.6 two weeks ago), Hct: 29.6.  Gastroenterology was consulted by EDP.  TRH consulted for further evaluation management of GIB.   Assessment & Plan:   Principal Problem:   GI bleed Active Problems:   Essential hypertension   Type 2 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, with long-term current use of insulin (HCC)   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   ESRD on dialysis ([place])   Dyslipidemia   Hypokalemia   Stage 2 skin ulcer of sacral region [facility])   Cerebral thrombosis with cerebral infarction   Protein-calorie malnutrition, severe  GI bleed 2/2 rectal ulcers Gastroenterology was consulted and followed during hospital course.  Patient underwent EGD which was normal and flex sigmoidoscopy with clean base, nonbleeding distal rectal ulcers (stercoral ulcers).  Gastroenterology now signed off on 7/28. --Hemoglobin stable, 8.5 on 05/29/21; no further reports of bleeding during hospitalization --Mirlax once daily --Okay to restart Eliquis per GI, awaiting for possible PEG tube placement.  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  MRI brain findings were similar in appearance to her prior MRI at [facility] with no drastic changes which is notable for a large right MCA stroke with advanced microvascular ischemic changes of the white matter, MRA head degraded but did not show occlusion and bilateral carotid ultrasound revealed R ICA and LICA with [id number] stenosis.  EEG with cortical dysfunction right hemisphere likely secondary to underlying structural abnormality/stroke and moderate encephalopathy with no seizure activity or epileptiform discharges.  Hemoglobin A1c 5.1, LDL 68.  No further recommendations per neurology and signed off 05/28/2021. --Atorvastatin 40 mg p.o. daily  Hyperlipidemia: Atorvastatin 40 mg p.o. daily  Type 2 diabetes mellitus Hemoglobin A1c 5.1, well controlled.  Diet controlled at baseline.  ESRD on HD Currently per nephrology, not a candidate for continued outpatient HD given her confusion, weakness and inability to sit up in a chair for HD outpatient.  Overall HD is not enhancing her quality of life at this time. --Nephrology following, appreciate assistance continues on HD while inpatient --Will need further palliative care discussions with nephrology involvement with direct information to family members regarding nephrology's plans not to continued HD outpatient.  Chronic thrombus left upper extremity --Eliquis currently on hold  Hypotension: Resolved --Midodrine 10 mg PO daily  Stage II pressure injury, POA Pressure Injury 05/20/21 Coccyx Mid Stage 2 -  Partial thickness loss of dermis presenting as a shallow open injury with a red, pink wound bed without slough. 1X0.5X0 (Active)  05/20/21 1315  Location: Coccyx  Location Orientation: Mid  Staging: Stage 2 -  Partial thickness loss of dermis  presenting as a shallow open injury with a red, pink wound bed without slough.  Wound Description (Comments): 1X0.5X0  Present on  Admission: Yes  --Continue wound care  Acute metabolic encephalopathy Adult failure to thrive Deconditioning Patient presenting from SNF with worsening confusion per family members; although ED noted that her mental status is currently at baseline per SNF but this is discordant with family.  CT head without contrast with multiple strokes.  MRI brain without contrast notable for old large right MCA stroke.  EEG with cortical dysfunction, moderate encephalopathy without seizure or epileptiform discharges.  Neurology was consulted, no further recommendations at this time.  Due to her poor oral intake, IR was consulted for PEG placement; unfortunately unable to place on 8/4 due to overlying transverse colon.  General surgery was consulted on 8/5 for surgical G-tube placement; awaiting further goals of care conversations with palliative care given nephrology recommendations of stopping outpatient dialysis prior to proceeding with PEG placement.  Urinary tract infection Completed 3-day course of ceftriaxone  Hepatic hypodensity Incidental finding of an ovoid hepatic hypodensity anterior aspect of liver measuring up to 4.9 cm, could represent mass versus focal fatty infiltration.  Will need outpatient follow-up with MRI liver for further evaluation if goals of care remain aggressive.  Hypokalemia Hypophosphatemia Repleted.  GERD: Continue PPI  Goals of care Long discussion with POA, patient's son Amy [name] via telephone on 05/30/2021.  Her son believes that her weakness stems from poor oral intake over the last 5 weeks and is waiting for feeding tube placement.  Unfortunately IR unable to place percutaneous tube yesterday due to poor window for insertion.  Per nephrology's notes, unlikely plan to continue HD outpatient given her weakness and inability to sit in a recliner.  Per son, Amy [name] states plan was for feeding tube and continue outpatient hemodialysis.  Consulted general surgery, Dr. Grandville Silos on 8/5  with no recommendation to place surgical feeding tube if patient not going to continue outpatient hemodialysis.  There seems to be a disconnect in the goals of care with medical providers and POA. --Will plan for family meeting on Monday 8/8 at 1:30 PM   DVT prophylaxis: heparin injection 5,000 Units Start: 05/26/21 1824 Place TED hose Start: 05/19/21 1706   Code Status: DNR Family Communication: No family present at bedside this morning, updated Beaulah Corin and Kim via telephone yesterday afternoon with plan for family meeting on Monday  Disposition Plan:  Level of care: Med-Surg Status is: Inpatient  Remains inpatient appropriate because:Ongoing diagnostic testing needed not appropriate for outpatient work up, Unsafe d/c plan, IV treatments appropriate due to intensity of illness or inability to take PO, and Inpatient level of care appropriate due to severity of illness  Dispo: The patient is from: SNF              Anticipated d/c is to: SNF              Patient currently is not medically stable to d/c.   Difficult to place patient No    Consultants:  Neurology Palliative care General surgery Interventional radiology [facility]  Procedures:  EEG 8/1: Suggestive of cortical dysfunction right hemisphere likely secondary to underlying structural abnormality/stroke, moderate encephalopathy, no seizure activity or epileptiform discharges. EGD 7/27, normal exam Flex sigmoidoscopy 7/27: 4 nonbleeding ulcers  Antimicrobials:  Ceftriaxone 8/3>> Cefazolin 8/3-8/5  Subjective: Patient seen examined at bedside, resting comfortably.  Currently in HD unit undergoing hemodialysis.  Remains confused.  States poor appetite.  Unable  to obtain any further ROS due to her baseline mental status.  No acute concerns per nursing staff.  Objective: Vitals:   05/31/21 1115 05/31/21 1122 05/31/21 1125 05/31/21 1226  BP: (!) 87/57 (!) 88/53 (!) 113/31 (!) 111/40  Pulse:    88  Resp: 10   18  Temp:     97.8 F (36.6 C)  TempSrc:    Oral  SpO2:    100%  Weight:      Height:        Intake/Output Summary (Last 24 hours) at 05/31/2021 1237 Last data filed at 05/30/2021 2217 Gross per 24 hour  Intake 243 ml  Output 0 ml  Net 243 ml   Filed Weights   05/29/21 0915 05/29/21 2109 05/31/21 0735  Weight: 57.5 kg 57.5 kg 56.4 kg    Examination:  General exam: Appears calm and comfortable, pleasantly confused Respiratory system: Clear to auscultation. Respiratory effort normal.  On 2 L nasal cannula with SPO2 100% at rest Cardiovascular system: S1 & S2 heard, RRR. No JVD, murmurs, rubs, gallops or clicks. No pedal edema. Gastrointestinal system: Abdomen is nondistended, soft and nontender. No organomegaly or masses felt. Normal bowel sounds heard. Central nervous system: Alert , oriented to person Airline pilot), but not Place ([place]), time (2020), or situation,  Extremities: No peripheral edema Skin: No rashes, lesions or ulcers Psychiatry: Judgement and insight appear poor. mood & affect appropriate.     Data Reviewed: I have personally reviewed following labs and imaging studies  CBC: Recent Labs  Lab 05/24/21 1256 05/26/21 0334 05/27/21 0531 05/29/21 0425  WBC 8.9 10.5 11.9* 14.3*  NEUTROABS  --  7.8* 9.3*  --   HGB 8.8* 8.4* 8.8* 8.5*  HCT 29.0* 26.0* 28.1* 26.6*  MCV 99.3 95.9 98.3 97.8  PLT 209 216 203 [id number]   Basic Metabolic Panel: Recent Labs  Lab 05/24/21 1256 05/25/21 1241 05/26/21 0334 05/27/21 0531 05/29/21 0425 05/30/21 1631 05/31/21 0645  NA 134*  --  136 136 136  --   --   K 4.1  --  3.5 3.7 4.0  --   --   CL 97*  --  98 97* 100  --   --   CO2 21*  --  '27 26 26  '$ --   --   GLUCOSE 96  --  106* 151* 166*  --   --   BUN 10  --  19 12 25*  --   --   CREATININE 3.97*  --  5.62* 3.75* 5.58*  --   --   CALCIUM 8.1*  --  8.1* 8.3* 8.0*  --   --   MG  --   --   --   --   --  1.9 1.9  PHOS  --  1.7*  --   --   --  1.8* 1.9*   GFR: Estimated  Creatinine Clearance: 8 mL/min (A) (by C-G formula based on SCr of 5.58 mg/dL (H)). Liver Function Tests: Recent Labs  Lab 05/27/21 0531  AST 34  ALT 26  ALKPHOS 78  BILITOT 0.8  PROT 6.4*  ALBUMIN 2.4*   No results for input(s): LIPASE, AMYLASE in the last 168 hours. Recent Labs  Lab 05/25/21 2022 05/27/21 0531  AMMONIA 61* 11   Coagulation Profile: No results for input(s): INR, PROTIME in the last 168 hours. Cardiac Enzymes: No results for input(s): CKTOTAL, CKMB, CKMBINDEX, TROPONINI in the last 168 hours. BNP (last 3 results) No  results for input(s): PROBNP in the last 8760 hours. HbA1C: No results for input(s): HGBA1C in the last 72 hours.  CBG: Recent Labs  Lab 05/30/21 1708 05/30/21 2147 05/30/21 2300 05/31/21 0706 05/31/21 1222  GLUCAP 85 56* 89 88 75   Lipid Profile: No results for input(s): CHOL, HDL, LDLCALC, TRIG, CHOLHDL, LDLDIRECT in the last 72 hours.  Thyroid Function Tests: No results for input(s): TSH, T4TOTAL, FREET4, T3FREE, THYROIDAB in the last 72 hours. Anemia Panel: No results for input(s): VITAMINB12, FOLATE, FERRITIN, TIBC, IRON, RETICCTPCT in the last 72 hours. Sepsis Labs: No results for input(s): PROCALCITON, LATICACIDVEN in the last 168 hours.  No results found for this or any previous visit (from the past 240 hour(s)).       Radiology Studies: IR GASTROSTOMY TUBE MOD SED  Result Date: 05/30/2021 INDICATION: CVA with left hemiparesis. Poor oral intake Feeding tube required for nutritional supplement EXAM: Attempted G-tube placement MEDICATIONS: Glucagon 1 mg IV ANESTHESIA/SEDATION: Versed 2 mg IV; Fentanyl 50 mcg IV Moderate Sedation Time:  45 minutes The patient was continuously monitored during the procedure by the interventional radiology nurse under my direct supervision. CONTRAST:  None FLUOROSCOPY TIME:  Fluoroscopy Time: 6 minutes 18 seconds (23 mGy). COMPLICATIONS: None immediate. PROCEDURE: Informed written consent was  obtained from the patient's son after a thorough discussion of the procedural risks, benefits and alternatives. All questions were addressed. Maximal Sterile Barrier Technique was utilized including caps, mask, sterile gowns, sterile gloves, sterile drape, hand hygiene and skin antiseptic. A timeout was performed prior to the initiation of the procedure. Patient positioned supine on the procedure table. Orogastric tube inserted utilizing fluoroscopic guidance. Stomach was insufflated. The liver edge was marked utilizing ultrasound. I could not be certain that the colon was out of the way. Rectal tube was inserted and barium enema was performed. The transverse colon obscured percutaneous window into the stomach. Procedure was terminated. IMPRESSION: No safe window for percutaneous gastrostomy tube placement due to interposed transverse colon. Electronically Signed   By: Miachel Roux M.D.   On: 05/30/2021 10:13   ECHOCARDIOGRAM COMPLETE  Result Date: 05/30/2021    ECHOCARDIOGRAM REPORT   Patient Name:   Amy [name] Henrichs Date of Exam: 05/30/2021 Medical Rec #:  [id number]        Height:       67.0 in Accession #:    [id number]       Weight:       126.8 lb Date of Birth:  November 16, 1945        BSA:          1.666 m Patient Age:    55 years         BP:           118/48 mmHg Patient Gender: F                HR:           102 bpm. Exam Location:  Inpatient Procedure: 2D Echo Indications:    stroke  History:        Patient has no prior history of Echocardiogram examinations. End                 stage renal disease; Risk Factors:Hypertension, Diabetes and                 Dyslipidemia.  Sonographer:    Johny Chess Referring PhysYF:[id number] [facility]  Sonographer Comments: Image acquisition challenging due to respiratory motion and  unable to cooperate. IMPRESSIONS  1. Left ventricular ejection fraction, by estimation, is 70 to 75%. The left ventricle has hyperdynamic function. The left ventricle has no regional wall  motion abnormalities. Left ventricular diastolic parameters are consistent with Grade I diastolic dysfunction (impaired relaxation).  2. Right ventricular systolic function is normal. The right ventricular size is normal. There is moderately elevated pulmonary artery systolic pressure.  3. The mitral valve is normal in structure. No evidence of mitral valve regurgitation.  4. The noncoronary cusp is calcified. . The aortic valve is tricuspid. There is mild calcification of the aortic valve. Aortic valve regurgitation is not visualized. No aortic stenosis is present. FINDINGS  Left Ventricle: Left ventricular ejection fraction, by estimation, is 70 to 75%. The left ventricle has hyperdynamic function. The left ventricle has no regional wall motion abnormalities. The left ventricular internal cavity size was small. There is no  left ventricular hypertrophy. Left ventricular diastolic parameters are consistent with Grade I diastolic dysfunction (impaired relaxation). Right Ventricle: The right ventricular size is normal. Right vetricular wall thickness was not well visualized. Right ventricular systolic function is normal. There is moderately elevated pulmonary artery systolic pressure. The tricuspid regurgitant velocity is 3.25 m/s, and with an assumed right atrial pressure of 3 mmHg, the estimated right ventricular systolic pressure is 123456 mmHg. Left Atrium: Left atrial size was normal in size. Right Atrium: Right atrial size was normal in size. Pericardium: There is no evidence of pericardial effusion. Mitral Valve: The mitral valve is normal in structure. No evidence of mitral valve regurgitation. Tricuspid Valve: The tricuspid valve is grossly normal. Tricuspid valve regurgitation is mild. Aortic Valve: The noncoronary cusp is calcified. The aortic valve is tricuspid. There is mild calcification of the aortic valve. Aortic valve regurgitation is not visualized. No aortic stenosis is present. Pulmonic Valve: The  pulmonic valve was grossly normal. Pulmonic valve regurgitation is not visualized. Aorta: The aortic root and ascending aorta are structurally normal, with no evidence of dilitation. IAS/Shunts: The atrial septum is grossly normal.  LEFT VENTRICLE PLAX 2D LVIDd:         3.20 cm  Diastology LVIDs:         1.90 cm  LV e' medial:    9.14 cm/s LV PW:         1.00 cm  LV E/e' medial:  8.0 LV IVS:        0.90 cm  LV e' lateral:   11.20 cm/s LVOT diam:     1.80 cm  LV E/e' lateral: 6.5 LV SV:         37 LV SV Index:   22 LVOT Area:     2.54 cm  RIGHT VENTRICLE             IVC RV S prime:     12.20 cm/s  IVC diam: 1.30 cm TAPSE (M-mode): 1.3 cm LEFT ATRIUM             Index       RIGHT ATRIUM          Index LA diam:        2.70 cm 1.62 cm/m  RA Area:     7.66 cm LA Vol (A2C):   23.0 ml 13.81 ml/m RA Volume:   13.70 ml 8.22 ml/m LA Vol (A4C):   18.0 ml 10.80 ml/m LA Biplane Vol: 20.4 ml 12.25 ml/m  AORTIC VALVE LVOT Vmax:   91.60 cm/s LVOT Vmean:  60.800 cm/s LVOT VTI:  0.144 m  AORTA Ao Root diam: 2.60 cm Ao Asc diam:  2.40 cm MITRAL VALVE                TRICUSPID VALVE MV Area (PHT): 6.54 cm     TR Peak grad:   42.2 mmHg MV Decel Time: 116 msec     TR Vmax:        325.00 cm/s MV E velocity: 73.10 cm/s MV A velocity: 124.00 cm/s  SHUNTS MV E/A ratio:  0.59         Systemic VTI:  0.14 m                             Systemic Diam: 1.80 cm Mertie Moores MD Electronically signed by Mertie Moores MD Signature Date/Time: 05/30/2021/10:57:41 AM    Final         Scheduled Meds:   stroke: mapping our early stages of recovery book   Does not apply Once   (feeding supplement) PROSource Plus  30 mL Oral BID BM   aspirin EC  81 mg Oral Daily   atorvastatin  40 mg Oral Daily   Chlorhexidine Gluconate Cloth  6 each Topical Q0600   collagenase   Topical Daily   darbepoetin (ARANESP) injection - DIALYSIS  100 mcg Intravenous Q Thu-HD   feeding supplement (NEPRO CARB STEADY)  237 mL Oral TID BM   free water  150 mL Per  Tube Q4H   gabapentin  100 mg Oral QHS   heparin injection (subcutaneous)  5,000 Units Subcutaneous Q8H   midodrine  10 mg Oral TID   mirtazapine  7.5 mg Oral QHS   multivitamin  1 tablet Oral QHS   pantoprazole  40 mg Oral Q0600   sodium chloride flush  3 mL Intravenous Q12H   [START ON 06/03/2021] thiamine injection  100 mg Intravenous Daily   venlafaxine XR  75 mg Oral Q breakfast   Continuous Infusions:  sodium chloride     sodium chloride     sodium chloride     feeding supplement (OSMOLITE 1.5 CAL)     thiamine injection 250 mg (05/30/21 1331)     LOS: 11 days    Time spent: 45 minutes spent on chart review, discussion with nursing staff, consultants, updating family and interview/physical exam; more than 50% of that time was spent in counseling and/or coordination of care.    Jalani Cullifer J [place] DO [facility] Available via Epic secure chat 7am-7pm After these hours, please refer to coverage provider listed on [email or web address] 05/31/2021, 12:37 PM

## 2021-05-31 NOTE — Procedures (Addendum)
I was present at this dialysis session. I have reviewed the session itself and made appropriate changes.   Vital signs in last 24 hours:  Temp:  [98 F (36.7 C)-98.9 F (37.2 C)] 98.9 F (37.2 C) (08/06 0633) Pulse Rate:  [86-94] 92 (08/06 0633) Resp:  [16-18] 16 (08/06 0633) BP: (107-119)/(48-68) 119/68 (08/06 0633) SpO2:  [100 %] 100 % (08/06 0633) Weight change:  Filed Weights   05/26/21 2054 05/29/21 0915 05/29/21 2109  Weight: 58.5 kg 57.5 kg 57.5 kg    Recent Labs  Lab 05/29/21 0425 05/30/21 1631  NA 136  --   K 4.0  --   CL 100  --   CO2 26  --   GLUCOSE 166*  --   BUN 25*  --   CREATININE 5.58*  --   CALCIUM 8.0*  --   PHOS  --  1.8*    Recent Labs  Lab 05/26/21 0334 05/27/21 0531 05/29/21 0425  WBC 10.5 11.9* 14.3*  NEUTROABS 7.8* 9.3*  --   HGB 8.4* 8.8* 8.5*  HCT 26.0* 28.1* 26.6*  MCV 95.9 98.3 97.8  PLT 216 203 228    Scheduled Meds:   stroke: mapping our early stages of recovery book   Does not apply Once   (feeding supplement) PROSource Plus  30 mL Oral BID BM   aspirin EC  81 mg Oral Daily   atorvastatin  40 mg Oral Daily   Chlorhexidine Gluconate Cloth  6 each Topical Q0600   collagenase   Topical Daily   darbepoetin (ARANESP) injection - DIALYSIS  100 mcg Intravenous Q Thu-HD   feeding supplement (NEPRO CARB STEADY)  237 mL Oral TID BM   free water  150 mL Per Tube Q4H   gabapentin  100 mg Oral QHS   heparin injection (subcutaneous)  5,000 Units Subcutaneous Q8H   midodrine  10 mg Oral TID   mirtazapine  7.5 mg Oral QHS   multivitamin  1 tablet Oral QHS   pantoprazole  40 mg Oral Q0600   sodium chloride flush  3 mL Intravenous Q12H   [START ON 06/03/2021] thiamine injection  100 mg Intravenous Daily   venlafaxine XR  75 mg Oral Q breakfast   Continuous Infusions:  sodium chloride     feeding supplement (OSMOLITE 1.5 CAL)     thiamine injection 250 mg (05/30/21 1331)   PRN Meds:.sodium chloride, fentaNYL, lidocaine (PF), midazolam,  sodium chloride flush    Dialysis Orders:  AF MWF= now in hospital TTS 3h 61mn 400/500    62.5kg    3K/2.5 Ca    P2  AVG HeRO   Hep none - Mircera 50 q 2 wks  (7/13)  Venofer 50 q wk - Parsabiv '5mg'$  IV q HD   Problem/Plan: Melena/Rectal bleeding - GI consulted. Hx rectal ulcers.  EGD 7/27 normal / flex sig =mucosal ulceration c/w sterocoral ulcers, non bleeding.  hgb low but stable 8.4 on 8/04 and now 8.5.   AMS: Palliative care consulted to establish goals of care. Pt is now DNR,  no colonscopy for now.  Per G-tube placement yesterday and IR noted "no safe window identified for percutaneous access to stomach due to overlying transverse colon "when brought for G-tube placement . Need to get palliative back involved because noted by Dr. GMoshe Ciproyesterday "think we are heading down a path which is not great for Amy [name] of recent CVA without meaningful recovery ESRD: Changed to T,Th, Sat. had  been attempting HD in chair but truthfully mental status has not been stable enough to attempt this right now . AND-  as noted by Dr. Moshe Cipro "have said before really dont think she is a candidate for OP dialysis any longer, too weak to sit up-  confused intermittently -  at high risk to dislodge her needles-  is safety issue.  Not to mention that her life is not being enhanced by staying on dialysis  "   LUE edema: Had f'gram 6/23 which showed chronic thrombus and was started on Eliquis - currently on hold due to #1. 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  Hypertension/volume  - BP soft. On midodrine for BP support.  Edema improving, UF as tolerated.  Under edw if weights correct.  No excess volume on exam Anemia  - Hgb trending down, last 8.5. Aranesp to138mg IV q Thursday. Metabolic bone disease -  Corr Ca okay and phos last on 7/3 1 low at 1.7 no phosphorus binders currently, phosphorus recheck with dialysis tomorrow before giving any replacement Nutrition: Albumin 2.4 (8/02 )poorly eating per  notes.Albumin low. Continue protein supps.  Noted yesterday "no safe window for G-tube placement per IR, further plans per admit team Recent CVA with no evidence of meaningful recovery. DIsposition - I concur with Dr. GShelva Majesticassessment that she is not benefiting from ongoing HD nor is she appropriate for outpatient dialysis given her poor functional, cognitive, and nutritional status.  She would require LTC placement, however without chance for meaningful recovery following her stroke, ongoing PT/OT unlikely to be beneficial.  Await Palliative care discussions with family.   [name]  MD 05/31/2021, 7:32 AM

## 2021-05-31 NOTE — Progress Notes (Addendum)
Pt arrived to HD unit from room 5M18  Pt began treatment, pressures stable. Pressures began to drop soon after, UF turned off intermittently to allow stabilization, goal adjusted, however, pt had to be bolused with 474m ns, given 10 mg midodrine, and 25g albumin. Despite all these intervention, no additional UF was tolerated by pt, pressures remained persistently low throughout treatment, until it was ended prematurely. She ended positive 4549m Asymptomatic during treatment. MD kept abreast of all pt issues, aware that pt not tolerating dialysis today.  Pressures stable at end, in low 90's with good MAP, RN aware, sent back to sending unit

## 2021-06-01 DIAGNOSIS — K922 Gastrointestinal hemorrhage, unspecified: Secondary | ICD-10-CM | POA: Diagnosis not present

## 2021-06-01 DIAGNOSIS — D638 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: Secondary | ICD-10-CM | POA: Diagnosis not present

## 2021-06-01 DIAGNOSIS — T782XXA Anaphylactic shock, unspecified, initial encounter: Secondary | ICD-10-CM | POA: Diagnosis not present

## 2021-06-01 DIAGNOSIS — I6330 Cerebral infarction due to thrombosis of unspecified cerebral artery: Secondary | ICD-10-CM | POA: Diagnosis not present

## 2021-06-01 LAB — GLUCOSE, CAPILLARY
Glucose-Capillary: 55 mg/dL — ABNORMAL LOW (ref 70–99)
Glucose-Capillary: 56 mg/dL — ABNORMAL LOW (ref 70–99)
Glucose-Capillary: 158 mg/dL — ABNORMAL HIGH (ref 70–99)
Glucose-Capillary: 109 mg/dL — ABNORMAL HIGH (ref 70–99)
Glucose-Capillary: 112 mg/dL — ABNORMAL HIGH (ref 70–99)
Glucose-Capillary: 121 mg/dL — ABNORMAL HIGH (ref 70–99)
Glucose-Capillary: 173 mg/dL — ABNORMAL HIGH (ref 70–99)

## 2021-06-01 LAB — MAGNESIUM: Magnesium: 1.7 mg/dL (ref 1.7–2.4)

## 2021-06-01 LAB — PHOSPHORUS: Phosphorus: 1.4 mg/dL — ABNORMAL LOW (ref 2.5–4.6)

## 2021-06-01 MED ORDER — DEXTROSE IN LACTATED RINGERS 5 % IV SOLN: INTRAVENOUS | Status: AC

## 2021-06-01 MED ORDER — DEXTROSE 50 % IV SOLN: 1.0000 | Freq: Once | INTRAVENOUS | Status: AC

## 2021-06-01 MED ORDER — K PHOS MONO-SOD PHOS DI & MONO 155-852-130 MG PO TABS
250.0000 mg | ORAL_TABLET | Freq: Two times a day (BID) | ORAL | Status: AC
  Administered 2021-06-01 – 2021-06-02 (×2): 250 mg via ORAL
  Filled 2021-06-01 (×5): qty 1

## 2021-06-01 MED ORDER — DEXTROSE 50 % IV SOLN
INTRAVENOUS | Status: AC
  Administered 2021-06-01: 50 mL via INTRAVENOUS
  Filled 2021-06-01: qty 50

## 2021-06-01 NOTE — Plan of Care (Signed)
?  Problem: Clinical Measurements: ?Goal: Ability to maintain clinical measurements within normal limits will improve ?Outcome: Not Progressing ?  ?

## 2021-06-01 NOTE — Progress Notes (Signed)
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  Hemoccult was noted to be positive.  Complicated by use of anticoagulants with Eliquis.  Recent admission to [facility] on 5/19 through 04/22/21 with flex sig on 6/1 with multiple rectal ulcers likely secondary to trauma from enemas and constipation.  In the ED, afebrile, bp: 126/88, HR: 55, RR: 18 oxygen: 94% room air. Pertinent labs: Potassium: 2.8, Sodium: 132, BUN 18 and creatinine 5.30 Fecal occult: positive , Hgb: 9.6 (9.6 two weeks ago), Hct: 29.6.  Gastroenterology was consulted by EDP.  TRH consulted for further evaluation management of GIB.   Assessment & Plan:   Principal Problem:   GI bleed Active Problems:   Essential hypertension   Type 2 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, with long-term current use of insulin (HCC)   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   ESRD on dialysis ([place])   Dyslipidemia   Hypokalemia   Stage 2 skin ulcer of sacral region [facility])   Cerebral thrombosis with cerebral infarction   Protein-calorie malnutrition, severe  GI bleed 2/2 rectal ulcers Gastroenterology was consulted and followed during hospital course.  Patient underwent EGD which was normal and flex sigmoidoscopy with clean base, nonbleeding distal rectal ulcers (stercoral ulcers).  Gastroenterology now signed off on 7/28. --Hemoglobin stable, 8.5 on 05/29/21; no further reports of bleeding during hospitalization --Mirlax once daily --Okay to restart Eliquis per GI, awaiting for possible PEG tube placement.  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  MRI brain findings were similar in appearance to her prior MRI at [facility] with no drastic changes which is notable for a large right MCA stroke with advanced microvascular ischemic changes of the white matter, MRA head degraded but did not show occlusion and bilateral carotid ultrasound revealed R ICA and LICA with [id number] stenosis.  EEG with cortical dysfunction right hemisphere likely secondary to underlying structural abnormality/stroke and moderate encephalopathy with no seizure activity or epileptiform discharges.  Hemoglobin A1c 5.1, LDL 68.  No further recommendations per neurology and signed off 05/28/2021. --Atorvastatin 40 mg p.o. daily  Hyperlipidemia: Atorvastatin 40 mg p.o. daily  Type 2 diabetes mellitus Hemoglobin A1c 5.1, well controlled.  Diet controlled at baseline.  ESRD on HD Currently per nephrology, not a candidate for continued outpatient HD given her confusion, weakness and inability to sit up in a chair for HD outpatient.  Overall HD is not enhancing her quality of life at this time. --Nephrology following, appreciate assistance continues on HD while inpatient --Will need further palliative care discussions with nephrology involvement with direct information to family members regarding nephrology's plans not to continued HD outpatient.  Chronic thrombus left upper extremity --Eliquis currently on hold  Hypotension:  Noted significant hypotension while on HD yesterday despite midodrine and albumin. --Midodrine 10 mg PO daily  Stage II pressure injury, POA Pressure Injury 05/20/21 Coccyx Mid Stage 2 -  Partial thickness loss of dermis presenting as a shallow open injury with a red, pink wound bed without slough. 1X0.5X0 (Active)  05/20/21 1315  Location: Coccyx  Location Orientation: Mid  Staging: Stage 2 -  Partial thickness loss of dermis presenting as a shallow open injury with a red, pink wound  bed without slough.  Wound Description (Comments): 1X0.5X0  Present on Admission: Yes  --Continue wound care  Acute metabolic encephalopathy Adult failure to thrive Deconditioning Patient presenting from SNF with worsening confusion per family members; although ED noted that her mental status is currently at baseline per SNF but this is discordant with family.  CT head without contrast with multiple strokes.  MRI brain without contrast notable for old large right MCA stroke.  EEG with cortical dysfunction, moderate encephalopathy without seizure or epileptiform discharges.  Neurology was consulted, no further recommendations at this time.  Due to her poor oral intake, IR was consulted for PEG placement; unfortunately unable to place on 8/4 due to overlying transverse colon.  General surgery was consulted on 8/5 for surgical G-tube placement; awaiting further goals of care conversations with palliative care given nephrology recommendations of stopping outpatient dialysis prior to proceeding with PEG placement.  Urinary tract infection Completed 3-day course of ceftriaxone  Hepatic hypodensity Incidental finding of an ovoid hepatic hypodensity anterior aspect of liver measuring up to 4.9 cm, could represent mass versus focal fatty infiltration.  Will need outpatient follow-up with MRI liver for further evaluation if goals of care remain aggressive.  Hypokalemia Hypophosphatemia Repleted.  GERD: Continue PPI  Goals of care Long discussion with POA, patient's son [name] via telephone on 05/30/2021.  Her son believes that her weakness stems from poor oral intake over the last 5 weeks and is waiting for feeding tube placement.  Unfortunately IR unable to place percutaneous tube yesterday due to poor window for insertion.  Per nephrology's notes, unlikely plan to continue HD outpatient given her weakness and inability to sit in a recliner.  Per son, [name] states plan was for feeding tube and continue  outpatient hemodialysis.  Consulted general surgery, Dr. Grandville Silos on 8/5 with no recommendation to place surgical feeding tube if patient not going to continue outpatient hemodialysis.  There seems to be a disconnect in the goals of care with medical providers and POA. --Will plan for family meeting on Monday 8/8 at 1:30 PM   DVT prophylaxis: heparin injection 5,000 Units Start: 05/26/21 1824 Place TED hose Start: 05/19/21 1706   Code Status: DNR Family Communication: No family present at bedside this morning, updated Beaulah Corin and Kim via telephone yesterday afternoon with plan for family meeting on Monday  Disposition Plan:  Level of care: Med-Surg Status is: Inpatient  Remains inpatient appropriate because:Ongoing diagnostic testing needed not appropriate for outpatient work up, Unsafe d/c plan, IV treatments appropriate due to intensity of illness or inability to take PO, and Inpatient level of care appropriate due to severity of illness  Dispo: The patient is from: SNF              Anticipated d/c is to: SNF              Patient currently is not medically stable to d/c.   Difficult to place patient No    Consultants:  Neurology Palliative care General surgery Interventional radiology [facility]  Procedures:  EEG 8/1: Suggestive of cortical dysfunction right hemisphere likely secondary to underlying structural abnormality/stroke, moderate encephalopathy, no seizure activity or epileptiform discharges. EGD 7/27, normal exam Flex sigmoidoscopy 7/27: 4 nonbleeding ulcers  Antimicrobials:  Ceftriaxone 8/3>> Cefazolin 8/3-8/5  Subjective: Patient seen examined at bedside, resting comfortably.  Remains pleasantly confused.  Continues  with poor appetite, did not eat breakfast this morning.  No family present at bedside this morning.  Unable to obtain any further ROS due to her baseline mental status.  No acute concerns per nursing staff.  Plan for family meeting tomorrow.   Ultimately poor/grim prognosis.  Objective: Vitals:   05/31/21 1720 05/31/21 2058 06/01/21 0451 06/01/21 0924  BP: (!) 110/48 (!) 104/41 (!) 117/55 102/69  Pulse: 94 94 96 99  Resp: '18 14 16 16  '$ Temp: 97.9 F (36.6 C) 98.1 F (36.7 C) 98.4 F (36.9 C) 98.2 F (36.8 C)  TempSrc: Oral Oral Oral Oral  SpO2: 100% 100% 100% 97%  Weight:      Height:        Intake/Output Summary (Last 24 hours) at 06/01/2021 1309 Last data filed at 06/01/2021 0810 Gross per 24 hour  Intake 0 ml  Output --  Net 0 ml   Filed Weights   05/29/21 2109 05/31/21 0735 05/31/21 1155  Weight: 57.5 kg 56.4 kg 57 kg    Examination:  General exam: Appears calm and comfortable, pleasantly confused Respiratory system: Clear to auscultation. Respiratory effort normal.  On 2 L nasal cannula with SPO2 100% at rest Cardiovascular system: S1 & S2 heard, RRR. No JVD, murmurs, rubs, gallops or clicks. No pedal edema. Gastrointestinal system: Abdomen is nondistended, soft and nontender. No organomegaly or masses felt. Normal bowel sounds heard. Central nervous system: Alert , oriented to person Airline pilot), but not Place ([place]), time (2020), or situation,  Extremities: No peripheral edema Skin: No rashes, lesions or ulcers Psychiatry: Judgement and insight appear poor. mood & affect appropriate.     Data Reviewed: I have personally reviewed following labs and imaging studies  CBC: Recent Labs  Lab 05/26/21 0334 05/27/21 0531 05/29/21 0425 05/31/21 1858  WBC 10.5 11.9* 14.3* 10.8*  NEUTROABS 7.8* 9.3*  --   --   HGB 8.4* 8.8* 8.5* 8.2*  HCT 26.0* 28.1* 26.6* 25.9*  MCV 95.9 98.3 97.8 97.0  PLT 216 203 228 [id number]   Basic Metabolic Panel: Recent Labs  Lab 05/26/21 0334 05/27/21 0531 05/29/21 0425 05/30/21 1631 05/31/21 0645 05/31/21 1858 06/01/21 0326  NA 136 136 136  --   --  134*  --   K 3.5 3.7 4.0  --   --  3.3*  --   CL 98 97* 100  --   --  96*  --   CO2 '27 26 26  '$ --   --  27  --    GLUCOSE 106* 151* 166*  --   --  73  --   BUN 19 12 25*  --   --  8  --   CREATININE 5.62* 3.75* 5.58*  --   --  2.77*  --   CALCIUM 8.1* 8.3* 8.0*  --   --  7.8*  --   MG  --   --   --  1.9 1.9 1.8 1.7  PHOS  --   --   --  1.8* 1.9* 1.2*  1.2* 1.4*   GFR: Estimated Creatinine Clearance: 16.3 mL/min (A) (by C-G formula based on SCr of 2.77 mg/dL (H)). Liver Function Tests: Recent Labs  Lab 05/27/21 0531 05/31/21 1858  AST 34  --   ALT 26  --   ALKPHOS 78  --   BILITOT 0.8  --   PROT 6.4*  --   ALBUMIN 2.4* 2.6*   No results for input(s): LIPASE, AMYLASE in the  last 168 hours. Recent Labs  Lab 05/25/21 2022 05/27/21 0531  AMMONIA 61* 11   Coagulation Profile: No results for input(s): INR, PROTIME in the last 168 hours. Cardiac Enzymes: No results for input(s): CKTOTAL, CKMB, CKMBINDEX, TROPONINI in the last 168 hours. BNP (last 3 results) No results for input(s): PROBNP in the last 8760 hours. HbA1C: No results for input(s): HGBA1C in the last 72 hours.  CBG: Recent Labs  Lab 06/01/21 0413 06/01/21 0415 06/01/21 0531 06/01/21 0822 06/01/21 1118  GLUCAP 56* 55* 158* 109* 112*   Lipid Profile: No results for input(s): CHOL, HDL, LDLCALC, TRIG, CHOLHDL, LDLDIRECT in the last 72 hours.  Thyroid Function Tests: No results for input(s): TSH, T4TOTAL, FREET4, T3FREE, THYROIDAB in the last 72 hours. Anemia Panel: No results for input(s): VITAMINB12, FOLATE, FERRITIN, TIBC, IRON, RETICCTPCT in the last 72 hours. Sepsis Labs: No results for input(s): PROCALCITON, LATICACIDVEN in the last 168 hours.  No results found for this or any previous visit (from the past 240 hour(s)).       Radiology Studies: No results found.      Scheduled Meds:   stroke: mapping our early stages of recovery book   Does not apply Once   (feeding supplement) PROSource Plus  30 mL Oral BID BM   aspirin EC  81 mg Oral Daily   atorvastatin  40 mg Oral Daily   Chlorhexidine  Gluconate Cloth  6 each Topical Q0600   collagenase   Topical Daily   darbepoetin (ARANESP) injection - DIALYSIS  100 mcg Intravenous Q Thu-HD   feeding supplement (NEPRO CARB STEADY)  237 mL Oral TID BM   free water  150 mL Per Tube Q4H   gabapentin  100 mg Oral QHS   heparin injection (subcutaneous)  5,000 Units Subcutaneous Q8H   midodrine  10 mg Oral TID   mirtazapine  7.5 mg Oral QHS   multivitamin  1 tablet Oral QHS   pantoprazole  40 mg Oral Q0600   phosphorus  250 mg Oral BID   sodium chloride flush  3 mL Intravenous Q12H   [START ON 06/03/2021] thiamine injection  100 mg Intravenous Daily   venlafaxine XR  75 mg Oral Q breakfast   Continuous Infusions:  sodium chloride     dextrose 5% lactated ringers 50 mL/hr at 06/01/21 0807   feeding supplement (OSMOLITE 1.5 CAL)     thiamine injection 250 mg (06/01/21 1139)     LOS: 12 days    Time spent: 43 minutes spent on chart review, discussion with nursing staff, consultants, updating family and interview/physical exam; more than 50% of that time was spent in counseling and/or coordination of care.    Squire Withey J [place] DO [facility] Available via Epic secure chat 7am-7pm After these hours, please refer to coverage provider listed on [email or web address] 06/01/2021, 1:09 PM

## 2021-06-01 NOTE — Progress Notes (Addendum)
Subjective: Eyes open and more talkative but pleasantly confused, not aware she had dialysis yesterday and noted she had significant problems with hypotension despite 25 g albumin and 10 mg midodrine  Noted for family meeting 8/8 at 1:30 PM per admit team for goals of care/palliative  Objective Vital signs in last 24 hours: Vitals:   05/31/21 1720 05/31/21 2058 06/01/21 0451 06/01/21 0924  BP: (!) 110/48 (!) 104/41 (!) 117/55 102/69  Pulse: 94 94 96 99  Resp: '18 14 16 16  '$ Temp: 97.9 F (36.6 C) 98.1 F (36.7 C) 98.4 F (36.9 C) 98.2 F (36.8 C)  TempSrc: Oral Oral Oral Oral  SpO2: 100% 100% 100% 97%  Weight:      Height:       Weight change:   Physical Exam: General: Thin chronically ill-appearing female in NAD lethargic and does not answer questions with meaningful response  Heart: RRR no MRG Lungs: CTA, Nonlabored breathing Abdomen: Bowel sounds positive no acute, soft nontender nondistended Extremities: Trace lower extremity edema, left upper arm swelling, nontender Dialysis Access: Left AVG positive bruit, with upper arm swelling       Dialysis Orders:  AF MWF= now in hospital TTS 3h 65mn 400/500    62.5kg    3K/2.5 Ca    P2  AVG HeRO   Hep none - Mircera 50 q 2 wks  (7/13)  Venofer 50 q wk - Parsabiv '5mg'$  IV q HDlower extremity edema, left upper arm swelling, nontender dialysis Access: Left AVG positive bruit, with upper arm swelling      Problem/Plan: Melena/Rectal bleeding - GI consulted. Hx rectal ulcers.  EGD 7/27 normal / flex sig =mucosal ulceration c/w sterocoral ulcers, non bleeding.  hgb low but stable 8.2  AMS: Palliative care consulted to establish goals of care. Pt is now DNR,  no colonscopy for now.  Not able to place G-tube =IR noted "no safe window identified for percutaneous access to stomach due to overlying transverse colon .  CCS consulted for open surgery placement and because of her altered mental state no family around postponing till next week  after palliative discussion need to get palliative back involved because noted by Dr. GMoshe Cipro= "think we are heading down a path which is not great for Ms CRieck noted history of recent CVA without meaningful recovery Noted for family meeting palliative 8/08 at 1:30 PM ESRD: Changed to T,Th,S , noted hypotension yesterday on dialysis  despite midodrine and albumin.  Had been attempting HD in chair but truthfully mental status has not been stable enough to attempt this right now . AND-  as noted by Dr. GMoshe Cipro"have said before really dont think she is a candidate for OP dialysis any longer, too weak to sit up-  confused intermittently -  at high risk to dislodge her needles-  safety issue.  Not to mention that her life is not being enhanced by staying on dialysis " LUE edema: Had f'gram 6/23 which showed chronic thrombus and was started on Eliquis - currently on hold due to #1. 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  Hypertension/volume  - BP soft. On midodrine for BP support.  Edema improving, UF as tolerated.  Under edw if weights correct.  to1040m IV q Thursday. Metabolic bone disease -  Corr Ca okay and phos last on 7/3 1 low at 1.7 > 1.4 no phosphorus binders currently, we will give Phos supplement Nutrition: Albumin 2.6.,  Decrease p.o. intake  per notes.Albumin low. Continue protein supps.  Feeding tube placement as above put on hold till family meeting. 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 excess volume on exam Anemia  - Hgb trending down, last 8.5. Aranesp to18mg IV q Thursday. Metabolic bone disease -  Corr Ca okay and phos last on 7/3 1 low at 1.7.>1.4 no phosphorus binders currently, phosphorus recheck with dialysis tomorrow before giving any replacement Nutrition: Albumin 2.6 decreased p.o. intake, feeding tube see #2 #2 Recent CVA with no evidence of meaningful recovery Disposition - As is noted with Dr. GShelva Majesticassessment that she is not benefiting from ongoing HD  nor is she appropriate for outpatient dialysis given her poor functional, cognitive, and nutritional status.  She would require LTC placement, however without chance for meaningful recovery following her stroke, ongoing PT/OT unlikely to be beneficial.  Await Palliative care discussions with family.  8/08 1:30 PM   DErnest Haber PA-C [facility] [phone]/04/2021,10:40 AM  LOS: 12 days   Labs: Basic Metabolic Panel: Recent Labs  Lab 05/27/21 0531 05/29/21 0425 05/30/21 1631 05/31/21 0645 05/31/21 1858 06/01/21 0326  NA 136 136  --   --  134*  --   K 3.7 4.0  --   --  3.3*  --   CL 97* 100  --   --  96*  --   CO2 26 26  --   --  27  --   GLUCOSE 151* 166*  --   --  73  --   BUN 12 25*  --   --  8  --   CREATININE 3.75* 5.58*  --   --  2.77*  --   CALCIUM 8.3* 8.0*  --   --  7.8*  --   PHOS  --   --    < > 1.9* 1.2*  1.2* 1.4*   < > = values in this interval not displayed.   Liver Function Tests: Recent Labs  Lab 05/27/21 0531 05/31/21 1858  AST 34  --   ALT 26  --   ALKPHOS 78  --   BILITOT 0.8  --   PROT 6.4*  --   ALBUMIN 2.4* 2.6*   No results for input(s): LIPASE, AMYLASE in the last 168 hours. Recent Labs  Lab 05/25/21 2022 05/27/21 0531  AMMONIA 61* 11   CBC: Recent Labs  Lab 05/26/21 0334 05/27/21 0531 05/29/21 0425 05/31/21 1858  WBC 10.5 11.9* 14.3* 10.8*  NEUTROABS 7.8* 9.3*  --   --   HGB 8.4* 8.8* 8.5* 8.2*  HCT 26.0* 28.1* 26.6* 25.9*  MCV 95.9 98.3 97.8 97.0  PLT 216 203 228 199   Cardiac Enzymes: No results for input(s): CKTOTAL, CKMB, CKMBINDEX, TROPONINI in the last 168 hours. CBG: Recent Labs  Lab 05/31/21 1641 06/01/21 0413 06/01/21 0415 06/01/21 0531 06/01/21 0822  GLUCAP 72 56* 55* 158* 109*    Studies/Results: No results found. Medications:  sodium chloride     dextrose 5% lactated ringers 50 mL/hr at 06/01/21 0807   feeding supplement (OSMOLITE 1.5 CAL)     thiamine injection 250 mg  (05/30/21 1331)     stroke: mapping our early stages of recovery book   Does not apply Once   (feeding supplement) PROSource Plus  30 mL Oral BID BM   aspirin EC  81 mg Oral Daily   atorvastatin  40 mg Oral Daily   Chlorhexidine Gluconate Cloth  6 each Topical Q0600   collagenase   Topical Daily  darbepoetin (ARANESP) injection - DIALYSIS  100 mcg Intravenous Q Thu-HD   feeding supplement (NEPRO CARB STEADY)  237 mL Oral TID BM   free water  150 mL Per Tube Q4H   gabapentin  100 mg Oral QHS   heparin injection (subcutaneous)  5,000 Units Subcutaneous Q8H   midodrine  10 mg Oral TID   mirtazapine  7.5 mg Oral QHS   multivitamin  1 tablet Oral QHS   pantoprazole  40 mg Oral Q0600   sodium chloride flush  3 mL Intravenous Q12H   [START ON 06/03/2021] thiamine injection  100 mg Intravenous Daily   venlafaxine XR  75 mg Oral Q breakfast   I have seen and examined this patient and agree with plan and assessment in the above note with renal recommendations/intervention highlighted.  She did not tolerate dialysis well yesterday due to hypotension.  She is not doing well and is not a suitable candidate for outpatient HD.  To have a family meeting tomorrow.  Governor Rooks Toron Bowring,MD 06/01/2021 3:36 PM

## 2021-06-02 DIAGNOSIS — K922 Gastrointestinal hemorrhage, unspecified: Secondary | ICD-10-CM | POA: Diagnosis not present

## 2021-06-02 DIAGNOSIS — R627 Adult failure to thrive: Secondary | ICD-10-CM | POA: Diagnosis not present

## 2021-06-02 DIAGNOSIS — Z515 Encounter for palliative care: Secondary | ICD-10-CM | POA: Diagnosis not present

## 2021-06-02 DIAGNOSIS — D638 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: Secondary | ICD-10-CM | POA: Diagnosis not present

## 2021-06-02 DIAGNOSIS — I6330 Cerebral infarction due to thrombosis of unspecified cerebral artery: Secondary | ICD-10-CM | POA: Diagnosis not present

## 2021-06-02 DIAGNOSIS — E785 Hyperlipidemia, unspecified: Secondary | ICD-10-CM | POA: Diagnosis not present

## 2021-06-02 DIAGNOSIS — N186 End stage renal disease: Secondary | ICD-10-CM | POA: Diagnosis not present

## 2021-06-02 DIAGNOSIS — E43 Unspecified severe protein-calorie malnutrition: Secondary | ICD-10-CM | POA: Diagnosis not present

## 2021-06-02 LAB — CBC
HCT: 25.7 % — ABNORMAL LOW (ref 36.0–46.0)
Hemoglobin: 8.2 g/dL — ABNORMAL LOW (ref 12.0–15.0)
MCH: 31.1 pg (ref 26.0–34.0)
MCHC: 31.9 g/dL (ref 30.0–36.0)
MCV: 97.3 fL (ref 80.0–100.0)
Platelets: 253 10*3/uL (ref 150–400)
RBC: 2.64 MIL/uL — ABNORMAL LOW (ref 3.87–5.11)
RDW: 19.6 % — ABNORMAL HIGH (ref 11.5–15.5)
WBC: 10.5 10*3/uL (ref 4.0–10.5)
nRBC: 0.4 % — ABNORMAL HIGH (ref 0.0–0.2)

## 2021-06-02 LAB — BASIC METABOLIC PANEL
Anion gap: 11 (ref 5–15)
BUN: 12 mg/dL (ref 8–23)
CO2: 26 mmol/L (ref 22–32)
Calcium: 7.8 mg/dL — ABNORMAL LOW (ref 8.9–10.3)
Chloride: 97 mmol/L — ABNORMAL LOW (ref 98–111)
Creatinine, Ser: 4.1 mg/dL — ABNORMAL HIGH (ref 0.44–1.00)
GFR, Estimated: 11 mL/min — ABNORMAL LOW (ref >60)
Glucose, Bld: 219 mg/dL — ABNORMAL HIGH (ref 70–99)
Potassium: 3.1 mmol/L — ABNORMAL LOW (ref 3.5–5.1)
Sodium: 134 mmol/L — ABNORMAL LOW (ref 135–145)

## 2021-06-02 LAB — GLUCOSE, CAPILLARY
Glucose-Capillary: 229 mg/dL — ABNORMAL HIGH (ref 70–99)
Glucose-Capillary: 195 mg/dL — ABNORMAL HIGH (ref 70–99)
Glucose-Capillary: 187 mg/dL — ABNORMAL HIGH (ref 70–99)
Glucose-Capillary: 145 mg/dL — ABNORMAL HIGH (ref 70–99)

## 2021-06-02 LAB — MAGNESIUM: Magnesium: 1.8 mg/dL (ref 1.7–2.4)

## 2021-06-02 MED ORDER — ONDANSETRON HCL 4 MG/2ML IJ SOLN
4.0000 mg | Freq: Three times a day (TID) | INTRAMUSCULAR | Status: DC | PRN
  Administered 2021-06-02 – 2021-07-13 (×3): 4 mg via INTRAVENOUS
  Filled 2021-06-02 (×3): qty 2

## 2021-06-02 MED ORDER — POTASSIUM CHLORIDE 20 MEQ PO PACK: 40.0000 meq | PACK | Freq: Once | ORAL | Status: DC

## 2021-06-02 NOTE — Progress Notes (Signed)
Continue to chart check and will await palliative discussion with patient's family this afternoon.  Agree with statement from our team last week.  If not felt to be a continued HD candidate then there is no role for g-tube placement.  Will monitor chart.  Henreitta Cea 8:48 AM 06/02/2021

## 2021-06-02 NOTE — Progress Notes (Signed)
Patient ID: [name], female   DOB: 11-05-1945, 75 y.o.   MRN: [id number]    Progress Note from the Palliative Medicine Team at [facility]   Patient Name: Amy [name]        Date: 06/02/2021 DOB: Dec 19, 1945  Age: 75 y.o. MRN#: [id number] Attending Physician: [place] Eric J, DO Primary Care Physician: [name], MD Admit Date: 05/19/2021   Medical records reviewed, spoke to attending, assessed patient, she is lethargic and unable to participate in conversation currently.   Patient is confused, not following commands.  She did not recognize her son today.   Continues with poor oral intake, albumin 2.4  I met as scheduled today met with patient's son/Bryan, Dr. [place] Dr. Burnett Sheng, Tobie Poet Phillips/  speech therapist for continued conversation regarding patient's overall medical situation.  Dr. [place] gave an overview of patient's complicated medical situation and associated long-term poor prognosis.  Again discussion regarding PEG placement; risks and benefits. . Dr. Burnett Sheng shared his concern as discussed with his colleagues that patient will not be eligible for outpatient hemodialysis.   I offered education offered on the difference between an aggressive medical intervention path and a palliative comfort path for this patient at this time in this situation.  Education offered on hospice benefit, specific to residential hospice eligibility.  I offered education on concept specific to human mortality and the limitations of medical interventions to prolong quality of life when the body fails to thrive.  Questions and concerns addressed.  [name] acknowledges the information he received today.  He is asking to talk further with other family members before making decisions.  Total time spent was 35 minutes   Greater than 50% of the time was spent in counseling and coordination of care  Wadie Lessen NP  Palliative Medicine Team Team Phone # [phone] Pager  [phone]

## 2021-06-02 NOTE — Progress Notes (Addendum)
[facility] Progress Note  Subjective: seen in room  Vitals:   06/01/21 1738 06/01/21 2104 06/02/21 0500 06/02/21 0613  BP: (!) 133/52 (!) 97/53  (!) 145/65  Pulse: 97 100  (!) 107  Resp: 18 16  15   Temp: 98 F (36.7 C) 98.5 F (36.9 C)  98.4 F (36.9 C)  TempSrc: Oral   Oral  SpO2: 100% 100%  100%  Weight:  57.1 kg 58.4 kg   Height:        Exam: General: Thin chronically ill-appearing female in NAD Heart: RRR no MRG Lungs: CTA, Nonlabored breathing Abdomen: Bowel sounds positive no acute, soft nontender nondistended Extremities: Trace lower extremity edema, left upper arm swelling, nontender Dialysis Access: Left AVG positive bruit, with upper arm swelling       Dialysis Orders:  AF TTS here 3h 73mn 400/500    62.5kg    3K/2.5 Ca    P2  AVG HeRO   Hep none - Mircera 50 q 2 wks  (7/13)  Venofer 50 q wk - Parsabiv 582mIV q HDlower extremity edema, left upper arm swelling, nontender dialysis Access: Left AVG positive bruit, with upper arm swelling      Problem/Plan: Melena/Rectal bleeding - GI consulted. Hx rectal ulcers.  EGD 7/27 normal / flex sig =mucosal ulceration c/w sterocoral ulcers, non bleeding.  hgb low but stable 8.2  AMS: Palliative care consulted to establish goals of care. Pt is now DNR. Providers met w/ family today and discussed her decline. From a dialysis perspective she would not be able to do OP HD given the combination of her physical and mental decline. Because of this we proposed consideration of transition to hospice. The son said he will discuss w/ his family.  ESRD: HD TTS here. HD tomorrow.  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  Hypertension/volume  - BP soft. On midodrine for BP support.  Edema improving, UF as tolerated.  Under edw if weights correct. Nutrition: Albumin 2.6.,  Decrease p.o. intake  per notes.Albumin low.  Continue protein supps.  Feeding tube placement as above put on hold till family meeting. 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 excess volume on exam Anemia  - Hgb trending down, last 8.5. Aranesp to10047mIV q Thursday. Metabolic bone disease -  Corr Ca okay and phos last on 7/3 1 low at 1.7.>1.4 no phosphorus binders currently, phosphorus recheck with dialysis tomorrow before giving any replacement Nutrition: Albumin 2.6 decreased p.o. intake, feeding tube see #2 #2 Recent CVA with no evidence of meaningful recovery    Rob Epifania Littrell 06/02/2021, 7:07 AM   Recent Labs  Lab 05/31/21 1858 06/01/21 0326 06/02/21 0505  K 3.3*  --  3.1*  BUN 8  --  12  CREATININE 2.77*  --  4.10*  CALCIUM 7.8*  --  7.8*  PHOS 1.2*  1.2* 1.4*  --   HGB 8.2*  --  8.2*   Inpatient medications:   stroke: mapping our early stages of recovery book   Does not apply Once   (feeding supplement) PROSource Plus  30 mL Oral BID BM   aspirin EC  81 mg Oral Daily   atorvastatin  40 mg Oral Daily   Chlorhexidine Gluconate Cloth  6 each Topical Q0600   collagenase   Topical Daily   darbepoetin (ARANESP) injection - DIALYSIS  100 mcg Intravenous  Q Thu-HD   feeding supplement (NEPRO CARB STEADY)  237 mL Oral TID BM   free water  150 mL Per Tube Q4H   gabapentin  100 mg Oral QHS   heparin injection (subcutaneous)  5,000 Units Subcutaneous Q8H   midodrine  10 mg Oral TID   mirtazapine  7.5 mg Oral QHS   multivitamin  1 tablet Oral QHS   pantoprazole  40 mg Oral Q0600   phosphorus  250 mg Oral BID   sodium chloride flush  3 mL Intravenous Q12H   [START ON 06/03/2021] thiamine injection  100 mg Intravenous Daily   venlafaxine XR  75 mg Oral Q breakfast    sodium chloride     dextrose 5% lactated ringers 50 mL/hr at 06/02/21 0243   feeding supplement (OSMOLITE 1.5 CAL)     thiamine injection Stopped (06/01/21 1209)   sodium chloride, fentaNYL, lidocaine (PF), midazolam, ondansetron (ZOFRAN) IV, sodium  chloride flush

## 2021-06-02 NOTE — Progress Notes (Signed)
Nutrition Follow-up  DOCUMENTATION CODES:  Severe malnutrition in context of chronic illness  INTERVENTION:  If within [place], recommend trying to place PEG tube again for long-term nutrition. If unable to, place Cortrak.  Once feeding tube is placed, restart TF:  Osmolite 1.5 @ 51m/hr increase by 161mhr Q8H until goal rate of 5510mr (1320m10ms reached 150ml37me water Q4H Provides 1980 kcals, 82 grams protein, 1005ml 39m water (1905ml t52m free water with flushes)  Monitor magnesium, potassium, and phosphorus daily for at least 3 days, MD to replete as needed, as pt is at risk for refeeding syndrome.  If not within [place], [place] liberalizing diet as much as possible and allow pt to eat and drink for comfort.  Continue Nepro shakes TID.  Continue Magic Cup TID with meals.  Continue with feeding assistance during meals.  Continue Rena-Vite.  NUTRITION DIAGNOSIS:  Severe Malnutrition related to chronic illness (ESRD on HD, prior CVA) as evidenced by percent weight loss, severe muscle depletion, energy intake < or equal to 75% for > or equal to 1 month. 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 7/27- EGD (normal exam), Flex sigmoidoscopy (4 nonbleeding ulcers) 8/1 - EGD (Suggestive of cortical dysfunction right hemisphere likely secondary to underlying structural abnormality/stroke, moderate encephalopathy, no seizure activity or epileptiform discharges) 8/7 - emesis x3  IR unable to place percutaneous tube on 8/6 due to poor window for insertion.  Palliative care meeting with family  this afternoon to discuss goals of care. If it is not felt for pt to be continued HD candidate, then there is no role for PEG placement, per Surgery PA.   Pt with 0% intake over the weekend, continued poor PO intake.  Admit wt: 58.1 kg Current wt: 58.4 kg  Medications: reviewed; ProSource Plus BID, Nepro shakes TID, FWF 150 ml q4 hrs, Remeron, Rena-Vite, Protonix, K-Phos, Klor-Con 40 mEq, thiamine, D5 in LR @ 55 ml/hr, Zofran PRN (given once today)  Labs: reviewed; Na 134 (L), K 3.1 (L), Glucose 109-229 (H)  Diet Order:   Diet Order             DIET DYS 3 Room service appropriate? Yes; Fluid consistency: Thin  Diet effective now                  EDUCATION NEEDS:  Education needs have been addressed  Skin:  Skin Assessment: Skin Integrity Issues: Skin Integrity Issues:: Stage II Stage II: Pressure Injury on coccyx; Open wound on buttocks  Last BM:  06/01/21 - Type 4, red, medium  Height:  Ht Readings from Last 1 Encounters:  05/20/21 '5\' 7"'$  (1.702 m)   Weight:  Wt Readings from Last 1 Encounters:  06/02/21 58.4 kg   BMI:  Body mass index is 20.16 kg/m.  Estimated Nutritional Needs:  Kcal:  2000-2200 Protein:  70-85 grams Fluid:  >2 L  Amy [name] (Amy [name]) Registered Dietitian I After-Hours/Weekend Pager # in [phone]

## 2021-06-02 NOTE — Progress Notes (Signed)
Brief Cortrak Note  Pt was to receive PEG last week but IR unable to perform procedure 2/2 pt's anatomy, so order was placed for Cortrak placement on 8/5. Unfortunately, Cortrak was not able to be placed 8/5 due to scheduling limitations. Decision was made to hold off on tube placement until after [place] discussions today. MD just messaged Cortrak RD to share the current plan -- pt's son has decided to possibly transition pt to hospice care tomorrow. MD instructed RD to delete consult for cortrak at this time as there is currently no more plan for feeding tube placement, but MD has stated that another consult will be placed if there are changes to the plan of care.    Larkin Ina, MS, RD, LDN (she/her/hers) RD pager number and weekend/on-call pager number located in [place].

## 2021-06-02 NOTE — Plan of Care (Signed)
°  Problem: Education: °Goal: Knowledge of General Education information will improve °Description: Including pain rating scale, medication(s)/side effects and non-pharmacologic comfort measures °Outcome: Progressing °  °Problem: Nutrition: °Goal: Adequate nutrition will be maintained °Outcome: Not Progressing °  °

## 2021-06-02 NOTE — Progress Notes (Signed)
  Speech Language Pathology Treatment:    Patient Details Name: Amy [name] MRN: [id number] DOB: 07/04/1946 Today's Date: 06/02/2021 Time: [id number] SLP Time Calculation (min) (ACUTE ONLY): 10 min  Assessment / Plan / Recommendation Clinical Impression  Pt was seen for dysphagia treatment. Pt's RN reported that the pt has been coughing with thin liquids and puree on this date. Pt was alert during the session and her lunch was at bedside. She was disoriented and insistent that there was a child sitting on her bed/lap who would not move despite her requests of the child/staff. Pt was resistant to p.o. intake and refused multiple trials. Pt accepted a bite of [place], but then spat it out after prolonged mastication stating "I'm gon' get sick from that food" and she refused all other solids. Pt tolerated two boluses of thin liquids via straw without overt s/sx of aspiration. Pt has a family meeting planned for today at 28 in which SLP has been asked to participate. SLP will continue to follow pt thereafter pending decision on [place].    HPI HPI: Pt is a 75 y.o. female who presented to ER for dark stool with + fecal occult. EGD 7/27: normal without evidence of GI bleed. Palliative care consulted and code status has been changed to DNR. Per EMR, pt's family has noted "physical, fucntional and cognitive decliene over the past several months but dramatic decline in the past 35 days". IR was consulted for PEG placement; unfortunately unable to place on 8/4 due to overlying transverse colon.  General surgery was consulted on 8/5 for surgical G-tube placement. 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 Palliative care following.      SLP Plan  Continue with current plan of care       Recommendations  Liquids provided via: Cup;Straw Medication Administration: Whole meds with puree Supervision: Staff to assist with self feeding;Full  supervision/cueing for compensatory strategies Compensations: Slow rate;Small sips/bites;Follow solids with liquid;Minimize environmental distractions Postural Changes and/or Swallow Maneuvers: Seated upright 90 degrees;Upright 30-60 min after meal                Oral Care Recommendations: Oral care BID Follow up Recommendations: 24 hour supervision/assistance (TBD) SLP Visit Diagnosis: Dysphagia, unspecified (R13.10);Dysphagia, oral phase (R13.11) Plan: Continue with current plan of care       Amy [name], [place], [place] Office number [phone] Pager [phone]                Amy [name] 06/02/2021, 1:13 PM

## 2021-06-02 NOTE — Progress Notes (Signed)
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  Hemoccult was noted to be positive.  Complicated by use of anticoagulants with Eliquis.  Recent admission to [facility] on 5/19 through 04/22/21 with flex sig on 6/1 with multiple rectal ulcers likely secondary to trauma from enemas and constipation.  In the ED, afebrile, bp: 126/88, HR: 55, RR: 18 oxygen: 94% room air. Pertinent labs: Potassium: 2.8, Sodium: 132, BUN 18 and creatinine 5.30 Fecal occult: positive , Hgb: 9.6 (9.6 two weeks ago), Hct: 29.6.  Gastroenterology was consulted by EDP.  TRH consulted for further evaluation management of GIB.   Assessment & Plan:   Principal Problem:   GI bleed Active Problems:   Essential hypertension   Type 2 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, with long-term current use of insulin (HCC)   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   ESRD on dialysis ([place])   Dyslipidemia   Hypokalemia   Stage 2 skin ulcer of sacral region [facility])   Cerebral thrombosis with cerebral infarction   Protein-calorie malnutrition, severe  GI bleed 2/2 rectal ulcers Gastroenterology was consulted and followed during hospital course.  Patient underwent EGD which was normal and flex sigmoidoscopy with clean base, nonbleeding distal rectal ulcers (stercoral ulcers).  Gastroenterology now signed off on 7/28. 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  MRI brain findings were similar in appearance to her prior MRI at [facility] with no drastic changes which is notable for a large right MCA stroke with advanced microvascular ischemic changes of the white matter, MRA head degraded but did not show occlusion and bilateral carotid ultrasound revealed R ICA and LICA with [id number] stenosis.  EEG with cortical dysfunction right hemisphere likely secondary to underlying structural abnormality/stroke and moderate encephalopathy with no seizure activity or epileptiform discharges.  Hemoglobin A1c 5.1, LDL 68.  No further recommendations per neurology and signed off 05/28/2021. --Atorvastatin 40 mg p.o. daily  Hyperlipidemia: Atorvastatin 40 mg p.o. daily  Type 2 diabetes mellitus Hemoglobin A1c 5.1, well controlled.  Diet controlled at baseline.  ESRD on HD Currently per nephrology, not a candidate for continued outpatient HD given her confusion, weakness and inability to sit up in a chair for HD outpatient.  Overall HD is not enhancing her quality of life at this time. --Nephrology following, appreciate assistance continues on HD while inpatient --Will need further palliative care discussions with nephrology involvement with direct information to family members regarding nephrology's plans not to continued HD outpatient.  Chronic thrombus left upper extremity --Eliquis currently on hold  Hypotension:  Noted significant hypotension while on HD yesterday despite midodrine and albumin. --Midodrine 10 mg PO daily  Stage II pressure injury, POA Pressure Injury 05/20/21 Coccyx Mid Stage 2 -  Partial thickness loss of dermis presenting as a shallow open injury with a red, pink wound bed without slough.  1X0.5X0 (Active)  05/20/21 1315  Location: Coccyx  Location Orientation: Mid  Staging: Stage 2 -  Partial thickness  loss of dermis presenting as a shallow open injury with a red, pink wound bed without slough.  Wound Description (Comments): 1X0.5X0  Present on Admission: Yes  --Continue wound care  Acute metabolic encephalopathy Adult failure to thrive Deconditioning Patient presenting from SNF with worsening confusion per family members; although ED noted that her mental status is currently at baseline per SNF but this is discordant with family.  CT head without contrast with multiple strokes.  MRI brain without contrast notable for old large right MCA stroke.  EEG with cortical dysfunction, moderate encephalopathy without seizure or epileptiform discharges.  Neurology was consulted, no further recommendations at this time.  Due to her poor oral intake, IR was consulted for PEG placement; unfortunately unable to place on 8/4 due to overlying transverse colon.  General surgery was consulted on 8/5 for surgical G-tube placement but currently holding as family meeting was conducted with patient's son, palliative care, nephrology, hospitalist, and speech therapy on 8/8 with concerns that she will not be accepted back to outpatient dialysis per Dr. Moshe Cipro and placement of a feeding tube would then be futile.  [name], Amy [name] to update treatment team tomorrow about potential hospice.  Urinary tract infection Completed 3-day course of ceftriaxone  Hepatic hypodensity Incidental finding of an ovoid hepatic hypodensity anterior aspect of liver measuring up to 4.9 cm, could represent mass versus focal fatty infiltration.  Will need outpatient follow-up with MRI liver for further evaluation if goals of care remain aggressive.  Hypokalemia Hypophosphatemia Repleted.  GERD: Continue PPI  Goals of care Long discussion with POA, patient's son Amy [name] via telephone on 05/30/2021.  Her son believes that her weakness stems from poor oral intake over the last 5 weeks and is waiting for feeding tube placement.   Unfortunately IR unable to place percutaneous tube yesterday due to poor window for insertion. Per nephrology, Dr. Moshe Cipro will not accept back patient and outpatient dialysis due to her inability to sit in a recliner and confusion and concern of pulling out dialysis needles.  Family meeting 8/8, son will consider possible hospice over the next 24 hours.   DVT prophylaxis: heparin injection 5,000 Units Start: 05/26/21 1824 Place TED hose Start: 05/19/21 1706   Code Status: DNR Family Communication: Updated patient's son, Amy [name] during family meeting with palliative care, nephrology, speech therapy and myself this afternoon.  Disposition Plan:  Level of care: Med-Surg Status is: Inpatient  Remains inpatient appropriate because:Ongoing diagnostic testing needed not appropriate for outpatient work up, Unsafe d/c plan, IV treatments appropriate due to intensity of illness or inability to take PO, and Inpatient level of care appropriate due to severity of illness  Dispo: The patient is from: SNF              Anticipated d/c is to: SNF              Patient currently is not medically stable to d/c.   Difficult to place patient No    Consultants:  Neurology Palliative care General surgery Interventional radiology [facility]  Procedures:  EEG 8/1: Suggestive of cortical dysfunction right hemisphere likely secondary to underlying structural abnormality/stroke, moderate encephalopathy, no seizure activity or epileptiform discharges. EGD 7/27, normal exam Flex sigmoidoscopy 7/27: 4 nonbleeding ulcers  Antimicrobials:  Ceftriaxone 8/3 - 8/6 Cefazolin 8/3-8/5  Subjective: Patient seen examined at bedside, resting comfortably.  Remains pleasantly confused.  Continues with poor appetite, did not eat breakfast this morning.  States "I do not feel hungry".  Unable to obtain any further ROS due to her baseline mental status.  No acute concerns per nursing staff.  Ultimately poor/grim prognosis.   Son to decide about potential hospice tomorrow.  Objective: Vitals:   06/01/21 2104 06/02/21 0500 06/02/21 0613 06/02/21 0940  BP: (!) 97/53  (!) 145/65 (!) 121/54  Pulse: 100  (!) 107 (!) 101  Resp: '16  15 18  '$ Temp: 98.5 F (36.9 C)  98.4 F (36.9 C) 98.3 F (36.8 C)  TempSrc:   Oral Oral  SpO2: 100%  100% 100%  Weight: 57.1 kg 58.4 kg    Height:        Intake/Output Summary (Last 24 hours) at 06/02/2021 1520 Last data filed at 06/02/2021 1216 Gross per 24 hour  Intake 1292.78 ml  Output 0 ml  Net 1292.78 ml   Filed Weights   05/31/21 1155 06/01/21 2104 06/02/21 0500  Weight: 57 kg 57.1 kg 58.4 kg    Examination:  General exam: Appears calm and comfortable, pleasantly confused Respiratory system: Clear to auscultation. Respiratory effort normal.  On 2 L nasal cannula with SPO2 100% at rest Cardiovascular system: S1 & S2 heard, RRR. No JVD, murmurs, rubs, gallops or clicks. No pedal edema. Gastrointestinal system: Abdomen is nondistended, soft and nontender. No organomegaly or masses felt. Normal bowel sounds heard. Central nervous system: Alert , oriented to person Airline pilot), but not Place ([place]), time (2020), or situation,  Extremities: No peripheral edema Skin: No rashes, lesions or ulcers Psychiatry: Judgement and insight appear poor. mood & affect appropriate.     Data Reviewed: I have personally reviewed following labs and imaging studies  CBC: Recent Labs  Lab 05/27/21 0531 05/29/21 0425 05/31/21 1858 06/02/21 0505  WBC 11.9* 14.3* 10.8* 10.5  NEUTROABS 9.3*  --   --   --   HGB 8.8* 8.5* 8.2* 8.2*  HCT 28.1* 26.6* 25.9* 25.7*  MCV 98.3 97.8 97.0 97.3  PLT 203 228 199 [id number]   Basic Metabolic Panel: Recent Labs  Lab 05/27/21 0531 05/29/21 0425 05/30/21 1631 05/31/21 0645 05/31/21 1858 06/01/21 0326 06/02/21 0505  NA 136 136  --   --  134*  --  134*  K 3.7 4.0  --   --  3.3*  --  3.1*  CL 97* 100  --   --  96*  --  97*  CO2 26 26  --    --  27  --  26  GLUCOSE 151* 166*  --   --  73  --  219*  BUN 12 25*  --   --  8  --  12  CREATININE 3.75* 5.58*  --   --  2.77*  --  4.10*  CALCIUM 8.3* 8.0*  --   --  7.8*  --  7.8*  MG  --   --  1.9 1.9 1.8 1.7 1.8  PHOS  --   --  1.8* 1.9* 1.2*  1.2* 1.4*  --    GFR: Estimated Creatinine Clearance: 11.3 mL/min (A) (by C-G formula based on SCr of 4.1 mg/dL (H)). Liver Function Tests: Recent Labs  Lab 05/27/21 0531 05/31/21 1858  AST 34  --   ALT 26  --   ALKPHOS 78  --   BILITOT 0.8  --   PROT 6.4*  --   ALBUMIN 2.4* 2.6*   No results for input(s):  LIPASE, AMYLASE in the last 168 hours. Recent Labs  Lab 05/27/21 0531  AMMONIA 11   Coagulation Profile: No results for input(s): INR, PROTIME in the last 168 hours. Cardiac Enzymes: No results for input(s): CKTOTAL, CKMB, CKMBINDEX, TROPONINI in the last 168 hours. BNP (last 3 results) No results for input(s): PROBNP in the last 8760 hours. HbA1C: No results for input(s): HGBA1C in the last 72 hours.  CBG: Recent Labs  Lab 06/01/21 1118 06/01/21 1647 06/01/21 2106 06/02/21 0645 06/02/21 1117  GLUCAP 112* 121* 173* 229* 195*   Lipid Profile: No results for input(s): CHOL, HDL, LDLCALC, TRIG, CHOLHDL, LDLDIRECT in the last 72 hours.  Thyroid Function Tests: No results for input(s): TSH, T4TOTAL, FREET4, T3FREE, THYROIDAB in the last 72 hours. Anemia Panel: No results for input(s): VITAMINB12, FOLATE, FERRITIN, TIBC, IRON, RETICCTPCT in the last 72 hours. Sepsis Labs: No results for input(s): PROCALCITON, LATICACIDVEN in the last 168 hours.  No results found for this or any previous visit (from the past 240 hour(s)).       Radiology Studies: No results found.      Scheduled Meds:   stroke: mapping our early stages of recovery book   Does not apply Once   (feeding supplement) PROSource Plus  30 mL Oral BID BM   aspirin EC  81 mg Oral Daily   atorvastatin  40 mg Oral Daily   Chlorhexidine Gluconate  Cloth  6 each Topical Q0600   collagenase   Topical Daily   darbepoetin (ARANESP) injection - DIALYSIS  100 mcg Intravenous Q Thu-HD   feeding supplement (NEPRO CARB STEADY)  237 mL Oral TID BM   free water  150 mL Per Tube Q4H   gabapentin  100 mg Oral QHS   heparin injection (subcutaneous)  5,000 Units Subcutaneous Q8H   midodrine  10 mg Oral TID   mirtazapine  7.5 mg Oral QHS   multivitamin  1 tablet Oral QHS   pantoprazole  40 mg Oral Q0600   phosphorus  250 mg Oral BID   potassium chloride  40 mEq Oral Once   sodium chloride flush  3 mL Intravenous Q12H   [START ON 06/03/2021] thiamine injection  100 mg Intravenous Daily   venlafaxine XR  75 mg Oral Q breakfast   Continuous Infusions:  sodium chloride     dextrose 5% lactated ringers 50 mL/hr at 06/02/21 0243   feeding supplement (OSMOLITE 1.5 CAL)     thiamine injection 250 mg (06/02/21 1139)     LOS: 13 days    Time spent: 43 minutes spent on chart review, discussion with nursing staff, consultants, updating family and interview/physical exam; more than 50% of that time was spent in counseling and/or coordination of care.    Kahne Helfand J [place] DO [facility] Available via Epic secure chat 7am-7pm After these hours, please refer to coverage provider listed on [email or web address] 06/02/2021, 3:20 PM

## 2021-06-03 DIAGNOSIS — E785 Hyperlipidemia, unspecified: Secondary | ICD-10-CM | POA: Diagnosis not present

## 2021-06-03 DIAGNOSIS — D638 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: Secondary | ICD-10-CM | POA: Diagnosis not present

## 2021-06-03 DIAGNOSIS — I6330 Cerebral infarction due to thrombosis of unspecified cerebral artery: Secondary | ICD-10-CM | POA: Diagnosis not present

## 2021-06-03 DIAGNOSIS — K922 Gastrointestinal hemorrhage, unspecified: Secondary | ICD-10-CM | POA: Diagnosis not present

## 2021-06-03 LAB — BASIC METABOLIC PANEL
Anion gap: 11 (ref 5–15)
BUN: 12 mg/dL (ref 8–23)
CO2: 26 mmol/L (ref 22–32)
Calcium: 8 mg/dL — ABNORMAL LOW (ref 8.9–10.3)
Chloride: 97 mmol/L — ABNORMAL LOW (ref 98–111)
Creatinine, Ser: 4.89 mg/dL — ABNORMAL HIGH (ref 0.44–1.00)
GFR, Estimated: 9 mL/min — ABNORMAL LOW (ref >60)
Glucose, Bld: 156 mg/dL — ABNORMAL HIGH (ref 70–99)
Potassium: 3.5 mmol/L (ref 3.5–5.1)
Sodium: 134 mmol/L — ABNORMAL LOW (ref 135–145)

## 2021-06-03 LAB — GLUCOSE, CAPILLARY
Glucose-Capillary: 146 mg/dL — ABNORMAL HIGH (ref 70–99)
Glucose-Capillary: 132 mg/dL — ABNORMAL HIGH (ref 70–99)
Glucose-Capillary: 122 mg/dL — ABNORMAL HIGH (ref 70–99)
Glucose-Capillary: 110 mg/dL — ABNORMAL HIGH (ref 70–99)

## 2021-06-03 LAB — MAGNESIUM: Magnesium: 1.9 mg/dL (ref 1.7–2.4)

## 2021-06-03 NOTE — Plan of Care (Signed)
  Problem: Education: Goal: Knowledge of General Education information will improve Description: Including pain rating scale, medication(s)/side effects and non-pharmacologic comfort measures Outcome: Completed/Met   Problem: Clinical Measurements: Goal: Will remain free from infection Outcome: Completed/Met Goal: Diagnostic test results will improve Outcome: Completed/Met Goal: Respiratory complications will improve Outcome: Completed/Met Goal: Cardiovascular complication will be avoided Outcome: Completed/Met

## 2021-06-03 NOTE — Progress Notes (Signed)
SLP Cancellation Note  Patient Details Name: SAHEJ HOLLING MRN: [id number] DOB: 07-Nov-1945   Cancelled treatment:       Reason Eval/Treat Not Completed: Fatigue/lethargy limiting ability to participate. SLP to continue efforts at subsequent date.   [place], [place], [facility] Office Number: [phone]  Acie Fredrickson 06/03/2021, 3:15 PM

## 2021-06-03 NOTE — Progress Notes (Signed)
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  Hemoccult was noted to be positive.  Complicated by use of anticoagulants with Eliquis.  Recent admission to [facility] on 5/19 through 04/22/21 with flex sig on 6/1 with multiple rectal ulcers likely secondary to trauma from enemas and constipation.  In the ED, afebrile, bp: 126/88, HR: 55, RR: 18 oxygen: 94% room air. Pertinent labs: Potassium: 2.8, Sodium: 132, BUN 18 and creatinine 5.30 Fecal occult: positive , Hgb: 9.6 (9.6 two weeks ago), Hct: 29.6.  Gastroenterology was consulted by EDP.  TRH consulted for further evaluation management of GIB.   Assessment & Plan:   Principal Problem:   GI bleed Active Problems:   Essential hypertension   Type 2 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, with long-term current use of insulin (HCC)   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   ESRD on dialysis (Amy [name])   Dyslipidemia   Hypokalemia   Stage 2 skin ulcer of sacral region [facility]   Cerebral thrombosis with cerebral infarction   Protein-calorie malnutrition, severe  GI bleed 2/2 rectal ulcers Gastroenterology was consulted and followed during hospital course.  Patient underwent EGD which was normal and flex sigmoidoscopy with clean base, nonbleeding distal rectal ulcers (stercoral ulcers).  Gastroenterology now signed off on 7/28. 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  MRI brain findings were similar in appearance to her prior MRI at [facility] with no drastic changes which is notable for a large right MCA stroke with advanced microvascular ischemic changes of the white matter, MRA head degraded but did not show occlusion and bilateral carotid ultrasound revealed R ICA and LICA with [id number] stenosis.  EEG with cortical dysfunction right hemisphere likely secondary to underlying structural abnormality/stroke and moderate encephalopathy with no seizure activity or epileptiform discharges.  Hemoglobin A1c 5.1, LDL 68.  No further recommendations per neurology and signed off 05/28/2021. --Atorvastatin 40 mg p.o. daily  Hyperlipidemia: Atorvastatin 40 mg p.o. daily  Type 2 diabetes mellitus Hemoglobin A1c 5.1, well controlled.  Diet controlled at baseline.  ESRD on HD Currently per nephrology, not a candidate for continued outpatient HD given her confusion, weakness and inability to sit up in a chair for HD outpatient.  Overall HD is not enhancing her quality of life at this time. --Nephrology following, appreciate assistance continues on HD while inpatient --Per nephrology, Dr. Moshe Cipro will not accept back to outpatient dialysis at this time due to her underlying confusion, weakness and unsafe to sit in a outpatient dialysis recliner as she has been pulling at the HD needles prior  Chronic thrombus left upper extremity --Eliquis currently on hold  Hypotension:  Noted significant hypotension while on HD yesterday despite midodrine and albumin. --Midodrine 10 mg PO TID  Stage II pressure injury, POA Pressure Injury 05/20/21 Coccyx Mid Stage 2 -  Partial thickness loss of dermis presenting as a shallow open injury with a red,  pink wound bed without slough. 1X0.5X0 (Active)  05/20/21 1315  Location: Coccyx  Location Orientation: Mid  Staging: Stage 2 -  Partial thickness loss of  dermis presenting as a shallow open injury with a red, pink wound bed without slough.  Wound Description (Comments): 1X0.5X0  Present on Admission: Yes  --Continue wound care  Acute metabolic encephalopathy Adult failure to thrive Deconditioning Patient presenting from SNF with worsening confusion per family members; although ED noted that her mental status is currently at baseline per SNF but this is discordant with family.  CT head without contrast with multiple strokes.  MRI brain without contrast notable for old large right MCA stroke.  EEG with cortical dysfunction, moderate encephalopathy without seizure or epileptiform discharges.  Neurology was consulted, no further recommendations at this time.  Due to her poor oral intake, IR was consulted for PEG placement; unfortunately unable to place on 8/4 due to overlying transverse colon.  General surgery was consulted on 8/5 for surgical G-tube placement but currently holding as family meeting was conducted with patient's son, palliative care, nephrology, hospitalist, and speech therapy on 8/8 with concerns that she will not be accepted back to outpatient dialysis per Dr. Moshe Cipro and placement of a feeding tube would then be futile.  [name], [name] to update treatment team about goals of care discussions with other family members.  Urinary tract infection Completed 3-day course of ceftriaxone  Hepatic hypodensity Incidental finding of an ovoid hepatic hypodensity anterior aspect of liver measuring up to 4.9 cm, could represent mass versus focal fatty infiltration.  Will need outpatient follow-up with MRI liver for further evaluation if goals of care remain aggressive.  Hypokalemia Hypophosphatemia Repleted.  GERD: Continue PPI  Goals of care Long discussion with POA, patient's son [name] via telephone on 05/30/2021.  Her son believes that her weakness stems from poor oral intake over the last 5 weeks and is waiting for feeding tube  placement.  Unfortunately IR unable to place percutaneous tube yesterday due to poor window for insertion. Per nephrology, Dr. Moshe Cipro will not accept back patient and outpatient dialysis due to her inability to sit in a recliner and confusion and concern of pulling out dialysis needles.  Family meeting 8/8, son will consider possible hospice after discussion with other family members.   DVT prophylaxis: heparin injection 5,000 Units Start: 05/26/21 1824 Place TED hose Start: 05/19/21 1706   Code Status: DNR Family Communication: Updated patient's son, [name] during family meeting with palliative care, nephrology, speech therapy and myself on 8/8.  Attempted to update patient's son, [name] via telephone this afternoon, went straight to voicemail.  Disposition Plan:  Level of care: Med-Surg Status is: Inpatient  Remains inpatient appropriate because:Ongoing diagnostic testing needed not appropriate for outpatient work up, Unsafe d/c plan, IV treatments appropriate due to intensity of illness or inability to take PO, and Inpatient level of care appropriate due to severity of illness  Dispo: The patient is from: SNF              Anticipated d/c is to: SNF              Patient currently is not medically stable to d/c.   Difficult to place patient No    Consultants:  Neurology Palliative care General surgery Interventional radiology [facility]  Procedures:  EEG 8/1: Suggestive of cortical dysfunction right hemisphere likely secondary to underlying structural abnormality/stroke, moderate encephalopathy, no seizure activity or epileptiform discharges. EGD 7/27, normal exam Flex sigmoidoscopy  7/27: 4 nonbleeding ulcers  Antimicrobials:  Ceftriaxone 8/3 - 8/6 Cefazolin 8/3-8/5  Subjective: Patient seen examined at bedside, resting comfortably.  Remains pleasantly confused.  Continues with poor appetite.  No family present this morning.  Unable to obtain any further ROS due to her  baseline mental status.  No acute concerns per nursing staff.  Ultimately poor/grim prognosis.  Son to decide about potential hospice after further family discussion.  Objective: Vitals:   06/03/21 1230 06/03/21 1300 06/03/21 1301 06/03/21 1333  BP: (!) 100/52 (!) 88/50 (!) 105/56 (!) 93/44  Pulse: (!) 105 (!) 102 (!) 102 (!) 106  Resp:    17  Temp:   98 F (36.7 C) 97.9 F (36.6 C)  TempSrc:    Oral  SpO2:   100% 100%  Weight:   59.5 kg   Height:        Intake/Output Summary (Last 24 hours) at 06/03/2021 1334 Last data filed at 06/03/2021 1301 Gross per 24 hour  Intake 1529.39 ml  Output 0 ml  Net 1529.39 ml   Filed Weights   06/02/21 2106 06/03/21 0958 06/03/21 1301  Weight: 59.4 kg 59.5 kg 59.5 kg    Examination:  General exam: Appears calm and comfortable, pleasantly confused, chronically ill/cachectic in appearance Respiratory system: Clear to auscultation. Respiratory effort normal.  On 2 L nasal cannula with SPO2 100% at rest Cardiovascular system: S1 & S2 heard, RRR. No JVD, murmurs, rubs, gallops or clicks. No pedal edema. Gastrointestinal system: Abdomen is nondistended, soft and nontender. No organomegaly or masses felt. Normal bowel sounds heard. Central nervous system: Alert , oriented to person Airline pilot), but not Place ([place]), time (2020), or situation,  Extremities: No peripheral edema Skin: No rashes, lesions or ulcers Psychiatry: Judgement and insight appear poor. mood & affect appropriate.     Data Reviewed: I have personally reviewed following labs and imaging studies  CBC: Recent Labs  Lab 05/29/21 0425 05/31/21 1858 06/02/21 0505  WBC 14.3* 10.8* 10.5  HGB 8.5* 8.2* 8.2*  HCT 26.6* 25.9* 25.7*  MCV 97.8 97.0 97.3  PLT 228 199 [id number]   Basic Metabolic Panel: Recent Labs  Lab 05/29/21 0425 05/30/21 1631 05/30/21 1631 05/31/21 0645 05/31/21 1858 06/01/21 0326 06/02/21 0505 06/03/21 0441  NA 136  --   --   --  134*  --  134* 134*   K 4.0  --   --   --  3.3*  --  3.1* 3.5  CL 100  --   --   --  96*  --  97* 97*  CO2 26  --   --   --  27  --  26 26  GLUCOSE 166*  --   --   --  73  --  219* 156*  BUN 25*  --   --   --  8  --  12 12  CREATININE 5.58*  --   --   --  2.77*  --  4.10* 4.89*  CALCIUM 8.0*  --   --   --  7.8*  --  7.8* 8.0*  MG  --  1.9   < > 1.9 1.8 1.7 1.8 1.9  PHOS  --  1.8*  --  1.9* 1.2*  1.2* 1.4*  --   --    < > = values in this interval not displayed.   GFR: Estimated Creatinine Clearance: 9.6 mL/min (A) (by C-G formula based on SCr of 4.89 mg/dL (H)). Liver Function  Tests: Recent Labs  Lab 05/31/21 1858  ALBUMIN 2.6*   No results for input(s): LIPASE, AMYLASE in the last 168 hours. No results for input(s): AMMONIA in the last 168 hours.  Coagulation Profile: No results for input(s): INR, PROTIME in the last 168 hours. Cardiac Enzymes: No results for input(s): CKTOTAL, CKMB, CKMBINDEX, TROPONINI in the last 168 hours. BNP (last 3 results) No results for input(s): PROBNP in the last 8760 hours. HbA1C: No results for input(s): HGBA1C in the last 72 hours.  CBG: Recent Labs  Lab 06/02/21 1117 06/02/21 1611 06/02/21 2012 06/03/21 0629 06/03/21 1329  GLUCAP 195* 187* 145* 146* 132*   Lipid Profile: No results for input(s): CHOL, HDL, LDLCALC, TRIG, CHOLHDL, LDLDIRECT in the last 72 hours.  Thyroid Function Tests: No results for input(s): TSH, T4TOTAL, FREET4, T3FREE, THYROIDAB in the last 72 hours. Anemia Panel: No results for input(s): VITAMINB12, FOLATE, FERRITIN, TIBC, IRON, RETICCTPCT in the last 72 hours. Sepsis Labs: No results for input(s): PROCALCITON, LATICACIDVEN in the last 168 hours.  No results found for this or any previous visit (from the past 240 hour(s)).       Radiology Studies: No results found.      Scheduled Meds:   stroke: mapping our early stages of recovery book   Does not apply Once   (feeding supplement) PROSource Plus  30 mL Oral BID BM    aspirin EC  81 mg Oral Daily   atorvastatin  40 mg Oral Daily   Chlorhexidine Gluconate Cloth  6 each Topical Q0600   collagenase   Topical Daily   darbepoetin (ARANESP) injection - DIALYSIS  100 mcg Intravenous Q Thu-HD   feeding supplement (NEPRO CARB STEADY)  237 mL Oral TID BM   free water  150 mL Per Tube Q4H   gabapentin  100 mg Oral QHS   heparin injection (subcutaneous)  5,000 Units Subcutaneous Q8H   midodrine  10 mg Oral TID   mirtazapine  7.5 mg Oral QHS   multivitamin  1 tablet Oral QHS   pantoprazole  40 mg Oral Q0600   potassium chloride  40 mEq Oral Once   sodium chloride flush  3 mL Intravenous Q12H   thiamine injection  100 mg Intravenous Daily   venlafaxine XR  75 mg Oral Q breakfast   Continuous Infusions:  sodium chloride     feeding supplement (OSMOLITE 1.5 CAL)       LOS: 14 days    Time spent: 43 minutes spent on chart review, discussion with nursing staff, consultants, updating family and interview/physical exam; more than 50% of that time was spent in counseling and/or coordination of care.    Keyaan Lederman J [place] DO [facility] Available via Epic secure chat 7am-7pm After these hours, please refer to coverage provider listed on [email or web address] 06/03/2021, 1:34 PM

## 2021-06-03 NOTE — TOC Progression Note (Signed)
Transition of Care [facility]) - Progression Note    Patient Details  Name: Amy [name] MRN: [id number] Date of Birth: 1946-04-11  [facility] CM/SW Contact  Sharlet Salina Mila Homer, LCSW Phone Number: 06/03/2021, 4:16 PM  Clinical Narrative: CSW continuing to follow and reviewing notes regarding patient progress and discharge disposition. Will continue to follow and provide SW intervention services as needed and discharge disposition assistance.       Expected Discharge Plan: [place] Barriers to Discharge: Continued Medical Work up  Expected Discharge Plan and Services Expected Discharge Plan: [place] In-house Referral: Clinical Social Work     Living arrangements for the past 2 months: [place] (Patient came to hospital from [place])                                       Social Determinants of Health (SDOH) Interventions    Readmission Risk Interventions No flowsheet data found.

## 2021-06-03 NOTE — Progress Notes (Signed)
[facility] Progress Note  Subjective: seen in room  Vitals:   06/03/21 1130 06/03/21 1135 06/03/21 1200 06/03/21 1230  BP: (!) 88/40 (!) 90/50 (!) 100/56 (!) 100/52  Pulse: 90 (!) 102 (!) 106 (!) 105  Resp: 12 10    Temp:      TempSrc:      SpO2:      Weight:      Height:        Exam: General: Thin chronically ill-appearing female in NAD Heart: RRR no MRG Lungs: CTA, Nonlabored breathing Abdomen: Bowel sounds positive no acute, soft nontender nondistended Extremities: Trace lower extremity edema, left upper arm swelling, nontender Dialysis Access: Left AVG positive bruit, with upper arm swelling       Dialysis:  AF TTS here 3h 73mn 400/500    62.5kg    3K/2.5 Ca    P2  AVG HeRO   Hep none - Mircera 50 q 2 wks  (7/13)  Venofer 50 q wk - Parsabiv 524mIV q HDlower extremity edema, left upper arm swelling, nontender dialysis Access: Left AVG positive bruit, with upper arm swelling      Problem/Plan: Melena/Rectal bleeding - GI consulted. Hx rectal ulcers.  EGD 7/27 normal / flex sig =mucosal ulceration c/w sterocoral ulcers, non bleeding.  hgb low but stable 8.2  AMS: Palliative care consulted to establish goals of care. Pt is now DNR. Providers and palliative care met w/ family 8/08 and discussed pt's decline. From a dialysis perspective she is not able to do OP HD given the combination of her physical and mental decline. Because of this we proposed consideration of transition to hospice. The son said he will discuss w/ his family. Await f/u from pall care team.  ESRD: HD TTS here. HD today.  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  Hypertension/volume  - BP soft. On midodrine for BP support.  Edema improving, UF as tolerated.  Under edw if weights correct. Nutrition: Albumin 2.6.,  Decrease p.o. intake  per notes.Albumin low. Continue protein supps.  Feeding  tube placement as above put on hold till family meeting. 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 excess volume on exam Anemia  - Hgb trending down, last 8.5. Aranesp to10047mIV q Thursday. Metabolic bone disease -  Corr Ca okay and phos last on 7/3 1 low at 1.7.>1.4 no phosphorus binders currently, phosphorus recheck with dialysis tomorrow before giving any replacement Nutrition: Albumin 2.6 decreased p.o. intake, feeding tube see #2 #2 Recent CVA with no evidence of meaningful recovery    Rob Rex Magee 06/03/2021, 12:45 PM   Recent Labs  Lab 05/31/21 1858 06/01/21 0326 06/02/21 0505 06/03/21 0441  K 3.3*  --  3.1* 3.5  BUN 8  --  12 12  CREATININE 2.77*  --  4.10* 4.89*  CALCIUM 7.8*  --  7.8* 8.0*  PHOS 1.2*  1.2* 1.4*  --   --   HGB 8.2*  --  8.2*  --     Inpatient medications:   stroke: mapping our early stages of recovery book   Does not apply Once   (feeding supplement) PROSource Plus  30 mL Oral BID BM   aspirin EC  81 mg Oral Daily   atorvastatin  40 mg Oral Daily   Chlorhexidine Gluconate Cloth  6 each Topical Q0600   collagenase   Topical  Daily   darbepoetin (ARANESP) injection - DIALYSIS  100 mcg Intravenous Q Thu-HD   feeding supplement (NEPRO CARB STEADY)  237 mL Oral TID BM   free water  150 mL Per Tube Q4H   gabapentin  100 mg Oral QHS   heparin injection (subcutaneous)  5,000 Units Subcutaneous Q8H   midodrine  10 mg Oral TID   mirtazapine  7.5 mg Oral QHS   multivitamin  1 tablet Oral QHS   pantoprazole  40 mg Oral Q0600   potassium chloride  40 mEq Oral Once   sodium chloride flush  3 mL Intravenous Q12H   thiamine injection  100 mg Intravenous Daily   venlafaxine XR  75 mg Oral Q breakfast    sodium chloride     feeding supplement (OSMOLITE 1.5 CAL)     sodium chloride, fentaNYL, lidocaine (PF), midazolam, ondansetron (ZOFRAN) IV, sodium chloride flush

## 2021-06-04 DIAGNOSIS — G9349 Other encephalopathy: Secondary | ICD-10-CM

## 2021-06-04 DIAGNOSIS — Z992 Dependence on renal dialysis: Secondary | ICD-10-CM | POA: Diagnosis not present

## 2021-06-04 DIAGNOSIS — N186 End stage renal disease: Secondary | ICD-10-CM | POA: Diagnosis not present

## 2021-06-04 DIAGNOSIS — R627 Adult failure to thrive: Secondary | ICD-10-CM | POA: Diagnosis not present

## 2021-06-04 DIAGNOSIS — K922 Gastrointestinal hemorrhage, unspecified: Secondary | ICD-10-CM | POA: Diagnosis not present

## 2021-06-04 DIAGNOSIS — E43 Unspecified severe protein-calorie malnutrition: Secondary | ICD-10-CM | POA: Diagnosis not present

## 2021-06-04 DIAGNOSIS — D638 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: Secondary | ICD-10-CM | POA: Diagnosis not present

## 2021-06-04 LAB — CBC
HCT: 25.5 % — ABNORMAL LOW (ref 36.0–46.0)
Hemoglobin: 8.1 g/dL — ABNORMAL LOW (ref 12.0–15.0)
MCH: 31 pg (ref 26.0–34.0)
MCHC: 31.8 g/dL (ref 30.0–36.0)
MCV: 97.7 fL (ref 80.0–100.0)
Platelets: 199 10*3/uL (ref 150–400)
RBC: 2.61 MIL/uL — ABNORMAL LOW (ref 3.87–5.11)
RDW: 19.7 % — ABNORMAL HIGH (ref 11.5–15.5)
WBC: 8.2 10*3/uL (ref 4.0–10.5)
nRBC: 0.2 % (ref 0.0–0.2)

## 2021-06-04 LAB — GLUCOSE, CAPILLARY
Glucose-Capillary: 91 mg/dL (ref 70–99)
Glucose-Capillary: 84 mg/dL (ref 70–99)
Glucose-Capillary: 57 mg/dL — ABNORMAL LOW (ref 70–99)
Glucose-Capillary: 64 mg/dL — ABNORMAL LOW (ref 70–99)
Glucose-Capillary: 100 mg/dL — ABNORMAL HIGH (ref 70–99)
Glucose-Capillary: 134 mg/dL — ABNORMAL HIGH (ref 70–99)

## 2021-06-04 LAB — BASIC METABOLIC PANEL
Anion gap: 10 (ref 5–15)
BUN: 12 mg/dL (ref 8–23)
CO2: 25 mmol/L (ref 22–32)
Calcium: 7.8 mg/dL — ABNORMAL LOW (ref 8.9–10.3)
Chloride: 99 mmol/L (ref 98–111)
Creatinine, Ser: 4.15 mg/dL — ABNORMAL HIGH (ref 0.44–1.00)
GFR, Estimated: 11 mL/min — ABNORMAL LOW (ref >60)
Glucose, Bld: 83 mg/dL (ref 70–99)
Potassium: 3.7 mmol/L (ref 3.5–5.1)
Sodium: 134 mmol/L — ABNORMAL LOW (ref 135–145)

## 2021-06-04 MED ORDER — GLUCOSE 40 % PO GEL
ORAL | Status: AC
  Administered 2021-06-04: 31 g
  Filled 2021-06-04: qty 1

## 2021-06-04 MED ORDER — DARBEPOETIN ALFA 100 MCG/0.5ML IJ SOSY: 100.0000 ug | PREFILLED_SYRINGE | INTRAMUSCULAR | Status: DC

## 2021-06-04 NOTE — Progress Notes (Signed)
  Speech Language Pathology Treatment: Dysphagia  Patient Details Name: Amy [name] MRN: [id number] DOB: [birth year] Today's Date: 06/04/2021 Time: 1202-1214 SLP Time Calculation (min) (ACUTE ONLY): 12 min  Assessment / Plan / Recommendation Clinical Impression  Pt was seen during lunch for dysphagia treatment. She was alert during the session, but continues to be resistant to p.o. intake. Pt's NT and RN reported that the pt continues to spit out various foods during meals and refuse others. Pt accepted boluses of spaghetti, and pears, and demonstrated thorough mastication, but she spat out these boluses and no swallowing was noted with solids. Coughing was noted once when pt was talking during bolus manipulation/mastication. Aspiration of prematurely spilled solids is suspected. No s/sx of aspiration were noted with thin liquids via straw. Pt's p.o. intake continues to be limited due to pt's refusal and is not due to any significant dysphagia. SLP suspects that any diet modification would further impact p.o intake and it does appear to be warranted at this time based on oropharyngeal swallow function. Pt's current diet will be continued. Further skilled SLP services are not clinically indicated at this time, but please reconsult if needed.   HPI HPI: Pt is a 75 y.o. female who presented to ER for dark stool with + fecal occult. EGD 7/27: normal without evidence of GI bleed. Palliative care consulted and code status has been changed to DNR. Per EMR, pt's family has noted "physical, fucntional and cognitive decliene over the past several months but dramatic decline in the past 35 days". IR was consulted for PEG placement; unfortunately unable to place on 8/4 due to overlying transverse colon.  General surgery was consulted on 8/5 for surgical G-tube placement, but recommended that it be deferred if pt is unable to tolerate outpatient HD. Palliative care consulted and family meeting conducted 8/8; pt's  son will discuss possible hospice with family. 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 Palliative care following.      SLP Plan  All goals met;Discharge SLP treatment due to (comment)       Recommendations  Diet recommendations: Dysphagia 3 (mechanical soft);Thin liquid Liquids provided via: Cup;Straw Medication Administration: Whole meds with puree Supervision: Staff to assist with self feeding;Full supervision/cueing for compensatory strategies Compensations: Slow rate;Small sips/bites;Follow solids with liquid;Minimize environmental distractions Postural Changes and/or Swallow Maneuvers: Seated upright 90 degrees;Upright 30-60 min after meal                Oral Care Recommendations: Oral care BID Follow up Recommendations: 24 hour supervision/assistance (TBD) SLP Visit Diagnosis: Dysphagia, unspecified (R13.10);Dysphagia, oral phase (R13.11) Plan: All goals met;Discharge SLP treatment due to (comment)       Amy [name] I. Hardin Negus, [place], [place] Office number [phone] Pager [phone]                Horton Marshall 06/04/2021, 1:51 PM

## 2021-06-04 NOTE — Progress Notes (Signed)
[facility] Progress Note  Subjective: seen in room  Vitals:   06/03/21 1734 06/03/21 2016 06/04/21 0506 06/04/21 0850  BP: 113/61 (!) 122/53 (!) 113/58 (!) 102/54  Pulse: 90 86 88 90  Resp: 18 18 20 16   Temp: 98.2 F (36.8 C) 98.3 F (36.8 C) 98.3 F (36.8 C) 98.4 F (36.9 C)  TempSrc: Oral  Oral   SpO2: 100% 96% 100% 100%  Weight:      Height:        Exam: General: Thin chronically ill-appearing female in NAD Heart: RRR no MRG Lungs: CTA, Nonlabored breathing Abdomen: Bowel sounds positive no acute, soft nontender nondistended Extremities: Trace lower extremity edema, left upper arm swelling, nontender Dialysis Access: Left AVG positive bruit, with upper arm swelling       Dialysis:  AF TTS here 3h 25mn 400/500    62.5kg    3K/2.5 Ca    P2  AVG HeRO   Hep none - Mircera 50 q 2 wks  (7/13)  Venofer 50 q wk - Parsabiv 530mIV q HDlower extremity edema, left upper arm swelling, nontender dialysis Access: Left AVG positive bruit, with upper arm swelling      Problem/Plan: Melena/Rectal bleeding - GI consulted. Hx rectal ulcers.  EGD 7/27 normal / flex sig =mucosal ulceration c/w sterocoral ulcers, non bleeding.  hgb low but stable 8.2  AMS: Palliative care consulted to establish goals of care. Pt is now DNR. Providers and palliative care met w/ family 8/08 and discussed pt's decline. From a dialysis perspective she is not able to do OP HD given the combination of her physical and mental decline. Because of this we proposed consideration of transition to hospice. The son said he will discuss w/ his family. Await f/u from pall care team.  ESRD: HD TTS here. Had HD yest 8/09.  Due to staffing issues we are doing HD just 2 times this week when possible. Next HD Saturday.  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  Hypertension/volume  - BP soft. On midodrine for BP  support.  Edema improving, UF as tolerated.  Under edw if weights correct. Nutrition: Albumin 2.6.,  Decrease p.o. intake  per notes.Albumin low. Continue protein supps.  Feeding tube placement as above put on hold till family meeting. 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 excess volume on exam Anemia  - Hgb trending down, last 8.5. Aranesp to10030mIV q Thursday. Nutrition: Albumin 2.6 decreased p.o. intake, feeding tube see #2 #2 Recent CVA with no evidence of meaningful recovery    Rob Keatyn Jawad 06/04/2021, 11:43 AM   Recent Labs  Lab 05/31/21 1858 06/01/21 0326 06/02/21 0505 06/03/21 0441 06/04/21 0331  K 3.3*  --  3.1* 3.5 3.7  BUN 8  --  12 12 12   CREATININE 2.77*  --  4.10* 4.89* 4.15*  CALCIUM 7.8*  --  7.8* 8.0* 7.8*  PHOS 1.2*  1.2* 1.4*  --   --   --   HGB 8.2*  --  8.2*  --  8.1*    Inpatient medications:   stroke: mapping our early stages of recovery book   Does not apply Once   (feeding supplement) PROSource Plus  30 mL Oral BID BM   aspirin EC  81 mg Oral Daily   atorvastatin  40 mg Oral Daily   Chlorhexidine Gluconate Cloth  6  each Topical Q0600   collagenase   Topical Daily   darbepoetin (ARANESP) injection - DIALYSIS  100 mcg Intravenous Q Thu-HD   feeding supplement (NEPRO CARB STEADY)  237 mL Oral TID BM   free water  150 mL Per Tube Q4H   gabapentin  100 mg Oral QHS   heparin injection (subcutaneous)  5,000 Units Subcutaneous Q8H   midodrine  10 mg Oral TID   mirtazapine  7.5 mg Oral QHS   multivitamin  1 tablet Oral QHS   pantoprazole  40 mg Oral Q0600   potassium chloride  40 mEq Oral Once   sodium chloride flush  3 mL Intravenous Q12H   thiamine injection  100 mg Intravenous Daily   venlafaxine XR  75 mg Oral Q breakfast    sodium chloride     feeding supplement (OSMOLITE 1.5 CAL)     sodium chloride, fentaNYL, lidocaine (PF), midazolam, ondansetron (ZOFRAN) IV, sodium chloride flush

## 2021-06-04 NOTE — Progress Notes (Incomplete)
Hypoglycemic Event  CBG: 57  Treatment: 8 oz juice/soda  Symptoms: None  Follow-up CBG:              Time:1717  CBG Result:64  Treatment: Dextrose 40% oral gel  Symptoms: None  Follow-up CBG:             Time: 1747             Result: 100   Possible Reasons for Event: Inadequate meal intake  Comments/MD notified:yes    Amy [name] Lunette Stands

## 2021-06-04 NOTE — Progress Notes (Signed)
Patient ID: Amy [name], female   DOB: 04-07-1946, 75 y.o.   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  On presentation, hemoglobin was 9.6; GI was consulted.  She underwent EGD which was normal and flexible sigmoidoscopy which showed nonbleeding distal rectal ulcers/stercoral ulcers.  Subsequently, GI signed off. Given the hospitalizing, neurology was also consulted for altered mental status; MRI brain was similar to prior MRI at [facility] with a large right MCA stroke.  Nephrology following for dialysis needs; nephrology is stating that patient will not be accepted back to outpatient dialysis at this time due to her underlying confusion, weakness and unsafe to sit in a dialysis recliner as an outpatient.  Due to her poor oral intake, IR was consulted for possible PEG tube placement but IR was unable to place because of overlying transverse colon.  General surgery was consulted for G-tube placement but family currently undecided.  Palliative care following for goals of care discussion.  Assessment & Plan:   GI bleed secondary to rectal ulcers Anemia of chronic disease: From renal failure -Patient underwent EGD which was normal and flex sigmoidoscopy with clean base, nonbleeding distal rectal ulcers (stercoral ulcers).  Gastroenterology now signed off on 7/28. -Hemoglobin stable; 8.1 today.  No further reports of bleeding during this hospitalization.   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 -MRI brain findings were similar in appearance to her prior MRI at [facility] with no drastic changes which is notable for a large right MCA stroke with advanced microvascular ischemic changes of the white matter; MRA head degraded but did not show occlusion and bilateral carotid ultrasound revealed R ICA and LICA with [id number] stenosis.  EEG with cortical dysfunction right hemisphere likely secondary to underlying structural abnormality/stroke and moderate encephalopathy with no seizure activity or epileptiform discharges.  Hemoglobin A1c 5.1, LDL 68.  No further recommendations per neurology and signed off 05/28/2021. -Continue atorvastatin 40 mg p.o. daily  Hyperlipidemia -Continue statin  Type 2 diabetes mellitus -Hemoglobin A1c 5.1, well controlled.  Diet controlled at baseline.   ESRD on HD -Currently per nephrology, not a candidate for continued outpatient HD given her confusion, weakness and inability to sit up in a chair for HD outpatient.  Overall HD is not enhancing her quality of life at this time. --Nephrology following, appreciate assistance continues on HD while inpatient --Per nephrology, Dr. Moshe Cipro will not accept back to outpatient dialysis at this time due to her underlying confusion, weakness and unsafe to sit in a outpatient dialysis recliner as she has been pulling at the HD needles prior -Nephrology is recommending consideration of transition to hospice.  Family undecided at this time.   Chronic thrombus left upper extremity --Eliquis currently on hold   Hypotension: -Blood pressure currently is stable.  Continue midodrine  Stage II mid coccyx pressure injury, present on admission -Continue wound care  Acute metabolic encephalopathy/renal failure  to thrive/generalized  deconditioning/very poor oral intake Goals of care -Neurology has already signed off.  Overall prognosis is very poor. - Due to her poor oral intake, IR was consulted for PEG placement; unfortunately unable to place on 8/4 due to overlying transverse colon.  General surgery was consulted on 8/5 for surgical G-tube placement but currently on hold as family has not decided yet. -Palliative care following. -If condition does not improve, recommend total comfort measures.  Urinary tract infection -Completed 3-day course of ceftriaxone  Hepatic hypodensity -Incidental finding of an ovoid hepatic hypodensity anterior aspect of liver measuring up to 4.9 cm, could represent mass versus focal fatty infiltration.  Will need outpatient follow-up with MRI liver for further evaluation if goals of care remain aggressive.  Leukocytosis -Resolved  GERD Continue PPI  DVT prophylaxis: Heparin subcutaneous Code Status: DNR Family Communication: None at bedside Disposition Plan: Status is: Inpatient  Remains inpatient appropriate because:Inpatient level of care appropriate due to severity of illness  Dispo: The patient is from: SNF              Anticipated d/c is to: SNF              Patient currently is not medically stable to d/c.   Difficult to place patient No  Consultants: Neurology/palliative care/general surgery/IR/GI  Procedures:  EEG 8/1: Suggestive of cortical dysfunction right hemisphere likely secondary to underlying structural abnormality/stroke, moderate encephalopathy, no seizure activity or epileptiform discharges. EGD 7/27, normal exam Flex sigmoidoscopy 7/27: 4 nonbleeding ulcers  Antimicrobials:  Anti-infectives (From admission, onward)    Start     Dose/Rate Route Frequency Ordered Stop   05/29/21 1622  ceFAZolin (ANCEF) 2-4 GM/100ML-% IVPB       Note to Pharmacy: Lytle Butte   : cabinet override      05/29/21 1622 05/30/21 0429   05/29/21 0000  cefTRIAXone (ROCEPHIN)  1 g in sodium chloride 0.9 % 100 mL IVPB  Status:  Discontinued        1 g 200 mL/hr over 30 Minutes Intravenous Every 24 hours 05/28/21 1801 05/28/21 1801   05/28/21 1830  cefTRIAXone (ROCEPHIN) 1 g in sodium chloride 0.9 % 100 mL IVPB        1 g 200 mL/hr over 30 Minutes Intravenous Every 24 hours 05/28/21 1801 05/30/21 1907   05/28/21 1630  ceFAZolin (ANCEF) IVPB 2g/100 mL premix        2 g 200 mL/hr over 30 Minutes Intravenous To Radiology 05/28/21 1537 05/29/21 1630        Subjective: Patient seen and examined at bedside.  No overnight fever, vomiting or seizures reported.  Patient is confused.  Objective: Vitals:   06/03/21 1734 06/03/21 2016 06/04/21 0506 06/04/21 0850  BP: 113/61 (!) 122/53 (!) 113/58 (!) 102/54  Pulse: 90 86 88 90  Resp: '18 18 20 16  '$ Temp: 98.2 F (36.8 C) 98.3 F (36.8 C) 98.3 F (36.8 C) 98.4 F (36.9 C)  TempSrc: Oral  Oral   SpO2: 100% 96% 100% 100%  Weight:      Height:        Intake/Output Summary (Last 24 hours) at 06/04/2021 1258 Last data filed at 06/03/2021 1600 Gross per 24 hour  Intake 240 ml  Output 0 ml  Net 240 ml   Filed Weights   06/02/21 2106 06/03/21 0958 06/03/21 1301  Weight: 59.4 kg 59.5 kg 59.5 kg    Examination:  General exam: Looks chronically ill and deconditioned.  Currently on 2 L oxygen via nasal cannula  respiratory system: Bilateral decreased breath sounds at bases with scattered crackles Cardiovascular system: S1 & S2 heard, Rate controlled Gastrointestinal system: Abdomen is nondistended, soft and nontender. Normal bowel sounds heard. Extremities: No cyanosis, clubbing; lower extremity edema present Central nervous system: Wakes up only slightly, no focal neurological deficits. Moving extremities Skin: No obvious ecchymosis/lesions Psychiatry: Cannot be assessed because of mental status   Data Reviewed: I have personally reviewed following labs and imaging studies  CBC: Recent Labs  Lab 05/29/21 0425  05/31/21 1858 06/02/21 0505 06/04/21 0331  WBC 14.3* 10.8* 10.5 8.2  HGB 8.5* 8.2* 8.2* 8.1*  HCT 26.6* 25.9* 25.7* 25.5*  MCV 97.8 97.0 97.3 97.7  PLT 228 199 253 [id number]   Basic Metabolic Panel: Recent Labs  Lab 05/29/21 0425 05/30/21 1631 05/30/21 1631 05/31/21 0645 05/31/21 1858 06/01/21 0326 06/02/21 0505 06/03/21 0441 06/04/21 0331  NA 136  --   --   --  134*  --  134* 134* 134*  K 4.0  --   --   --  3.3*  --  3.1* 3.5 3.7  CL 100  --   --   --  96*  --  97* 97* 99  CO2 26  --   --   --  27  --  '26 26 25  '$ GLUCOSE 166*  --   --   --  73  --  219* 156* 83  BUN 25*  --   --   --  8  --  '12 12 12  '$ CREATININE 5.58*  --   --   --  2.77*  --  4.10* 4.89* 4.15*  CALCIUM 8.0*  --   --   --  7.8*  --  7.8* 8.0* 7.8*  MG  --  1.9   < > 1.9 1.8 1.7 1.8 1.9  --   PHOS  --  1.8*  --  1.9* 1.2*  1.2* 1.4*  --   --   --    < > = values in this interval not displayed.   GFR: Estimated Creatinine Clearance: 11.3 mL/min (A) (by C-G formula based on SCr of 4.15 mg/dL (H)). Liver Function Tests: Recent Labs  Lab 05/31/21 1858  ALBUMIN 2.6*   No results for input(s): LIPASE, AMYLASE in the last 168 hours. No results for input(s): AMMONIA in the last 168 hours. Coagulation Profile: No results for input(s): INR, PROTIME in the last 168 hours. Cardiac Enzymes: No results for input(s): CKTOTAL, CKMB, CKMBINDEX, TROPONINI in the last 168 hours. BNP (last 3 results) No results for input(s): PROBNP in the last 8760 hours. HbA1C: No results for input(s): HGBA1C in the last 72 hours. CBG: Recent Labs  Lab 06/03/21 1329 06/03/21 1630 06/03/21 2051 06/04/21 0635 06/04/21 1139  GLUCAP 132* 122* 110* 91 84   Lipid Profile: No results for input(s): CHOL, HDL, LDLCALC, TRIG, CHOLHDL, LDLDIRECT in the last 72 hours. Thyroid Function Tests: No results for input(s): TSH, T4TOTAL, FREET4, T3FREE, THYROIDAB in the last 72 hours. Anemia Panel: No results for input(s): VITAMINB12, FOLATE,  FERRITIN, TIBC, IRON, RETICCTPCT in the last 72 hours. Sepsis Labs: No results for input(s): PROCALCITON, LATICACIDVEN in the last 168 hours.  No results found for this or any previous visit (from the past 240 hour(s)).       Radiology Studies: No results found.      Scheduled Meds:   stroke: mapping our early stages of recovery  book   Does not apply Once   (feeding supplement) PROSource Plus  30 mL Oral BID BM   aspirin EC  81 mg Oral Daily   atorvastatin  40 mg Oral Daily   Chlorhexidine Gluconate Cloth  6 each Topical Q0600   collagenase   Topical Daily   darbepoetin (ARANESP) injection - DIALYSIS  100 mcg Intravenous Q Thu-HD   feeding supplement (NEPRO CARB STEADY)  237 mL Oral TID BM   free water  150 mL Per Tube Q4H   gabapentin  100 mg Oral QHS   heparin injection (subcutaneous)  5,000 Units Subcutaneous Q8H   midodrine  10 mg Oral TID   mirtazapine  7.5 mg Oral QHS   multivitamin  1 tablet Oral QHS   pantoprazole  40 mg Oral Q0600   potassium chloride  40 mEq Oral Once   sodium chloride flush  3 mL Intravenous Q12H   thiamine injection  100 mg Intravenous Daily   venlafaxine XR  75 mg Oral Q breakfast   Continuous Infusions:  sodium chloride     feeding supplement (OSMOLITE 1.5 CAL)            Aline August, MD [facility] 06/04/2021, 12:58 PM

## 2021-06-04 NOTE — Progress Notes (Signed)
Patient ID: Amy [name], female   DOB: [birth year], 75 y.o.   MRN: [id number]    Progress Note from the Palliative Medicine Team at [facility]   Patient Name: Amy [name]        Date: 06/04/2021 DOB: Jun 24, 1946  Age: 75 y.o. MRN#: [id number] Attending Physician: Aline August, MD Primary Care Physician: [name], MD Admit Date: 05/19/2021   Medical records reviewed, spoke to attending, assessed patient, she is lethargic and unable to participate in conversation currently.   Not following commands   Poor oral intake, albumin 2.4.  She is made recommendation for dysphagia 3 mechanical soft diet with thin liquids.  Outstanding decisions regarding treatment options and anticipatory care needs remain.  Treatment team has been awaiting callback from son [name], hoping to give family direction regarding goals of care.  Concerns continue regarding the patient's ability to tolerate or even be a candidate for outpatient dialysis secondary to increased weakness and intermittent confusion.   Safety is always a priority.    This NP spoke to son/Brian by phone as a follow up for palliative medicine needs and emotional support.  Continued education regarding current medical situation, patient's multiple comorbidities and her high risk for decompensation.  He tells me support person Jetty Peeks plans to call attending team to ask questions before family can make decisions.    In order to give family time and space to voice questions and concerns I placed a call to Joelene Millin, left a message and contact info for her to call back to schedule a meeting to include attending team, nephrology, myself and her to address those questions tomorrow.  Await callback  Plan of care: -DNR/DNI -Continue hemodialysis -Continue to treat the treatable and hope for improvement - Transition back to skilled nursing facility today when medically stable  Again stressed the importance of  continued conversation with  family and the medical providers regarding overall plan of care and treatment options,  ensuring decisions are within the context of the patients values and GOCs.  Questions and concerns addressed   Discussed with Dr  Starla Link  Total time spent was 25 minutes  Greater than 50% of the time was spent in counseling and coordination of care  Wadie Lessen NP  Palliative Medicine Team Team Phone # [phone] Pager [phone]

## 2021-06-05 DIAGNOSIS — K922 Gastrointestinal hemorrhage, unspecified: Secondary | ICD-10-CM | POA: Diagnosis not present

## 2021-06-05 DIAGNOSIS — Z992 Dependence on renal dialysis: Secondary | ICD-10-CM | POA: Diagnosis not present

## 2021-06-05 DIAGNOSIS — E43 Unspecified severe protein-calorie malnutrition: Secondary | ICD-10-CM | POA: Diagnosis not present

## 2021-06-05 DIAGNOSIS — N186 End stage renal disease: Secondary | ICD-10-CM | POA: Diagnosis not present

## 2021-06-05 LAB — GLUCOSE, CAPILLARY
Glucose-Capillary: 189 mg/dL — ABNORMAL HIGH (ref 70–99)
Glucose-Capillary: 66 mg/dL — ABNORMAL LOW (ref 70–99)
Glucose-Capillary: 99 mg/dL (ref 70–99)
Glucose-Capillary: 104 mg/dL — ABNORMAL HIGH (ref 70–99)
Glucose-Capillary: 57 mg/dL — ABNORMAL LOW (ref 70–99)
Glucose-Capillary: 116 mg/dL — ABNORMAL HIGH (ref 70–99)
Glucose-Capillary: 85 mg/dL (ref 70–99)

## 2021-06-05 MED ORDER — DEXTROSE 50 % IV SOLN
INTRAVENOUS | Status: AC
  Filled 2021-06-05: qty 50

## 2021-06-05 MED ORDER — DEXTROSE 50 % IV SOLN
INTRAVENOUS | Status: AC
  Administered 2021-06-05: 25 mL
  Filled 2021-06-05: qty 50

## 2021-06-05 MED ORDER — DEXTROSE 50 % IV SOLN
12.5000 g | INTRAVENOUS | Status: AC
  Administered 2021-06-05: 12.5 g via INTRAVENOUS
  Filled 2021-06-05: qty 50

## 2021-06-05 MED ORDER — DEXTROSE 5 % IV SOLN: INTRAVENOUS | Status: DC

## 2021-06-05 NOTE — Plan of Care (Signed)
  Problem: Elimination: Goal: Will not experience complications related to bowel motility Outcome: Completed/Met Goal: Will not experience complications related to urinary retention Outcome: Completed/Met

## 2021-06-05 NOTE — Progress Notes (Signed)
Hypoglycemic Event  CBG:  Results for [name], Amy CHILA (MRN [id number]) as of 06/05/2021 04:13  Ref. Range 06/05/2021 04:06  Glucose-Capillary Latest Ref Range: 70 - 99 mg/dL 66 (L)   Treatment: D50 25 mL (12.5 gm)  Symptoms: None  Follow-up CBG: Time: CBG Result: Results for COUSINAcey, [name] (MRN [id number]) as of 06/05/2021 05:14  Ref. Range 06/05/2021 04:19  Glucose-Capillary Latest Ref Range: 70 - 99 mg/dL 189 (H)   Possible Reasons for Event: Inadequate meal intake  Comments/MD notified:   Viviano Simas

## 2021-06-05 NOTE — Progress Notes (Signed)
Patient ID: Amy [name], female   DOB: 19-Oct-1946, 75 y.o.   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  On presentation, hemoglobin was 9.6; GI was consulted.  She underwent EGD which was normal and flexible sigmoidoscopy which showed nonbleeding distal rectal ulcers/stercoral ulcers.  Subsequently, GI signed off. Given the hospitalizing, neurology was also consulted for altered mental status; MRI brain was similar to prior MRI at [facility] with a large right MCA stroke.  Nephrology following for dialysis needs; nephrology is stating that patient will not be accepted back to outpatient dialysis at this time due to her underlying confusion, weakness and unsafe to sit in a dialysis recliner as an outpatient.  Due to her poor oral intake, IR was consulted for possible PEG tube placement but IR was unable to place because of overlying transverse colon.  General surgery was consulted for G-tube placement but family currently undecided.  Palliative care following for goals of care discussion.  Assessment & Plan:   GI bleed secondary to rectal ulcers Anemia of chronic disease: From renal failure -Patient underwent EGD which was normal and flex sigmoidoscopy with clean base, nonbleeding distal rectal ulcers (stercoral ulcers).  Gastroenterology now signed off on 7/28. -Hemoglobin stable; 8.1 today.  No further reports of bleeding during this hospitalization.   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 -MRI brain findings were similar in appearance to her prior MRI at [facility] with no drastic changes which is notable for a large right MCA stroke with advanced microvascular ischemic changes of the white matter; MRA head degraded but did not show occlusion and bilateral carotid ultrasound revealed R ICA and LICA with [id number] stenosis.  EEG with cortical dysfunction right hemisphere likely secondary to underlying structural abnormality/stroke and moderate encephalopathy with no seizure activity or epileptiform discharges.  Hemoglobin A1c 5.1, LDL 68.  No further recommendations per neurology and signed off 05/28/2021. -Continue atorvastatin 40 mg p.o. daily  Hyperlipidemia -Continue statin  Type 2 diabetes mellitus with episodes of hypoglycemia -Hemoglobin A1c 5.1.  Diet controlled at baseline. -Patient has had episodes of hypoglycemia due to poor oral intake.  Monitor CBGs.   ESRD on HD -Currently per nephrology, not a candidate for continued outpatient HD given her confusion, weakness and inability to sit up in a chair for HD outpatient.  Overall HD is not enhancing her quality of life at this time. --Nephrology following, appreciate assistance continues on HD while inpatient --Per nephrology, Dr. Moshe Cipro will not accept back to outpatient dialysis at this time due to her underlying confusion, weakness and unsafe to sit in a outpatient dialysis recliner as she has been pulling at the HD needles prior -Nephrology is recommending consideration of transition to hospice.  Family undecided at this time.   Chronic thrombus left upper extremity --Eliquis currently on hold   Hypotension: -Blood pressure currently is stable.  Continue midodrine  Stage  II mid coccyx pressure injury, present on  admission -Continue wound care  Acute metabolic encephalopathy/renal failure to thrive/generalized deconditioning/very poor oral intake Goals of care -Neurology has already signed off.  Overall prognosis is very poor. - Due to her poor oral intake, IR was consulted for PEG placement; unfortunately unable to place on 8/4 due to overlying transverse colon.  General surgery was consulted on 8/5 for surgical G-tube placement but currently on hold as family has not decided yet. -Palliative care following. -If condition does not improve, recommend total comfort measures.  Urinary tract infection -Completed 3-day course of ceftriaxone  Hepatic hypodensity -Incidental finding of an ovoid hepatic hypodensity anterior aspect of liver measuring up to 4.9 cm, could represent mass versus focal fatty infiltration.  Will need outpatient follow-up with MRI liver for further evaluation if goals of care remain aggressive.  Leukocytosis -Resolved  GERD Continue PPI  DVT prophylaxis: Heparin subcutaneous Code Status: DNR Family Communication: None at bedside Disposition Plan: Status is: Inpatient  Remains inpatient appropriate because:Inpatient level of care appropriate due to severity of illness  Dispo: The patient is from: SNF              Anticipated d/c is to: SNF              Patient currently is not medically stable to d/c.   Difficult to place patient No  Consultants: Neurology/palliative care/general surgery/IR/GI  Procedures:  EEG 8/1: Suggestive of cortical dysfunction right hemisphere likely secondary to underlying structural abnormality/stroke, moderate encephalopathy, no seizure activity or epileptiform discharges. EGD 7/27, normal exam Flex sigmoidoscopy 7/27: 4 nonbleeding ulcers  Antimicrobials:  Anti-infectives (From admission, onward)    Start     Dose/Rate Route Frequency Ordered Stop   05/29/21 1622  ceFAZolin (ANCEF) 2-4 GM/100ML-% IVPB       Note to Pharmacy: Lytle Butte   : cabinet override      05/29/21 1622 05/30/21 0429   05/29/21 0000  cefTRIAXone (ROCEPHIN) 1 g in sodium chloride 0.9 % 100 mL IVPB  Status:  Discontinued        1 g 200 mL/hr over 30 Minutes Intravenous Every 24 hours 05/28/21 1801 05/28/21 1801   05/28/21 1830  cefTRIAXone (ROCEPHIN) 1 g in sodium chloride 0.9 % 100 mL IVPB        1 g 200 mL/hr over 30 Minutes Intravenous Every 24 hours 05/28/21 1801 05/30/21 1907   05/28/21 1630  ceFAZolin (ANCEF) IVPB 2g/100 mL premix        2 g 200 mL/hr over 30 Minutes Intravenous To Radiology 05/28/21 1537 05/29/21 1630        Subjective: Patient seen and examined at bedside.  Poor historian; confused.  No overnight seizures, vomiting or fever reported. Objective: Vitals:   06/04/21 0850 06/04/21 1826 06/04/21 1959 06/05/21 0425  BP: (!) 102/54 109/65 (!) 107/53 (!) 120/51  Pulse: 90 88 94 94  Resp: '16 17 18 18  '$ Temp: 98.4 F (36.9 C) 98.2 F (36.8 C) 98.3 F (36.8 C) 97.9 F (36.6 C)  TempSrc:   Oral Oral  SpO2: 100% 99% 100% 92%  Weight:      Height:        Intake/Output Summary (Last 24 hours) at 06/05/2021 0817 Last data filed at 06/05/2021 0600 Gross per 24 hour  Intake 620 ml  Output 0 ml  Net 620 ml    Filed Weights   06/02/21 2106 06/03/21 0958 06/03/21 1301  Weight: 59.4 kg 59.5  kg 59.5 kg    Examination:  General exam: Looks chronically ill and deconditioned.  No distress.  On room air currently.   Respiratory system: Decreased breath sounds at bases bilaterally with some crackles  cardiovascular system: Rate controlled, S1-S2 heard Gastrointestinal system: Abdomen is distended, soft and nontender.  Bowel sounds are heard. Extremities: Bilateral lower extremity edema present; no cyanosis Central nervous system: Wakes up only very slightly, extremely slow to respond, very confused; no focal neurological deficits.  Left-sided weakness present.   Skin: No obvious petechiae/other obvious lesions Psychiatry:  Could not be assessed because of mental status  Data Reviewed: I have personally reviewed following labs and imaging studies  CBC: Recent Labs  Lab 05/31/21 1858 06/02/21 0505 06/04/21 0331  WBC 10.8* 10.5 8.2  HGB 8.2* 8.2* 8.1*  HCT 25.9* 25.7* 25.5*  MCV 97.0 97.3 97.7  PLT 199 253 [id number]    Basic Metabolic Panel: Recent Labs  Lab 05/30/21 1631 05/31/21 0645 05/31/21 1858 06/01/21 0326 06/02/21 0505 06/03/21 0441 06/04/21 0331  NA  --   --  134*  --  134* 134* 134*  K  --   --  3.3*  --  3.1* 3.5 3.7  CL  --   --  96*  --  97* 97* 99  CO2  --   --  27  --  '26 26 25  '$ GLUCOSE  --   --  73  --  219* 156* 83  BUN  --   --  8  --  '12 12 12  '$ CREATININE  --   --  2.77*  --  4.10* 4.89* 4.15*  CALCIUM  --   --  7.8*  --  7.8* 8.0* 7.8*  MG 1.9 1.9 1.8 1.7 1.8 1.9  --   PHOS 1.8* 1.9* 1.2*  1.2* 1.4*  --   --   --     GFR: Estimated Creatinine Clearance: 11.3 mL/min (A) (by C-G formula based on SCr of 4.15 mg/dL (H)). Liver Function Tests: Recent Labs  Lab 05/31/21 1858  ALBUMIN 2.6*    No results for input(s): LIPASE, AMYLASE in the last 168 hours. No results for input(s): AMMONIA in the last 168 hours. Coagulation Profile: No results for input(s): INR, PROTIME in the last 168 hours. Cardiac Enzymes: No results for input(s): CKTOTAL, CKMB, CKMBINDEX, TROPONINI in the last 168 hours. BNP (last 3 results) No results for input(s): PROBNP in the last 8760 hours. HbA1C: No results for input(s): HGBA1C in the last 72 hours. CBG: Recent Labs  Lab 06/04/21 1747 06/04/21 2025 06/05/21 0406 06/05/21 0419 06/05/21 0637  GLUCAP 100* 134* 66* 189* 99    Lipid Profile: No results for input(s): CHOL, HDL, LDLCALC, TRIG, CHOLHDL, LDLDIRECT in the last 72 hours. Thyroid Function Tests: No results for input(s): TSH, T4TOTAL, FREET4, T3FREE, THYROIDAB in the last 72 hours. Anemia Panel: No results for input(s): VITAMINB12, FOLATE, FERRITIN, TIBC, IRON, RETICCTPCT in  the last 72 hours. Sepsis Labs: No results for input(s): PROCALCITON, LATICACIDVEN in the last 168 hours.  No results found for this or any previous visit (from the past 240 hour(s)).       Radiology Studies: No results found.      Scheduled Meds:   stroke: mapping our early stages of recovery book   Does not apply Once   (feeding supplement) PROSource Plus  30 mL Oral BID BM   aspirin EC  81 mg Oral Daily   atorvastatin  40 mg  Oral Daily   collagenase   Topical Daily   [START ON 06/07/2021] darbepoetin (ARANESP) injection - DIALYSIS  100 mcg Intravenous Q Sat-HD   dextrose  12.5 g Intravenous STAT   feeding supplement (NEPRO CARB STEADY)  237 mL Oral TID BM   free water  150 mL Per Tube Q4H   gabapentin  100 mg Oral QHS   heparin injection (subcutaneous)  5,000 Units Subcutaneous Q8H   midodrine  10 mg Oral TID   mirtazapine  7.5 mg Oral QHS   multivitamin  1 tablet Oral QHS   pantoprazole  40 mg Oral Q0600   potassium chloride  40 mEq Oral Once   sodium chloride flush  3 mL Intravenous Q12H   thiamine injection  100 mg Intravenous Daily   venlafaxine XR  75 mg Oral Q breakfast   Continuous Infusions:  sodium chloride     feeding supplement (OSMOLITE 1.5 CAL)            Aline August, MD [facility] 06/05/2021, 8:17 AM

## 2021-06-05 NOTE — Progress Notes (Signed)
Hypoglycemic Event   CBG: 57   Treatment: 8 oz juice/soda   Symptoms: None   Follow-up CBG: Time:1717   CBG Result:64   Treatment: Dextrose 40% oral gel   Symptoms: None   Follow-up CBG: Time: 1747   CBG Result: 100    Possible Reasons for Event: Inadequate meal intake   Comments/MD notified: Yes

## 2021-06-05 NOTE — Progress Notes (Signed)
[facility] Progress Note  Subjective: seen in room  Vitals:   06/04/21 1826 06/04/21 1959 06/05/21 0425 06/05/21 0859  BP: 109/65 (!) 107/53 (!) 120/51 113/64  Pulse: 88 94 94 100  Resp: 17 18 18 17   Temp: 98.2 F (36.8 C) 98.3 F (36.8 C) 97.9 F (36.6 C) 98 F (36.7 C)  TempSrc:  Oral Oral   SpO2: 99% 100% 92% 99%  Weight:      Height:        Exam: General: Thin chronically ill-appearing female in NAD Heart: RRR no MRG Lungs: CTA, Nonlabored breathing Abdomen: Bowel sounds positive no acute, soft nontender nondistended Extremities: Trace lower extremity edema, left upper arm swelling, nontender Dialysis Access: Left AVG positive bruit, with upper arm swelling       Dialysis:  AF TTS here 3h 91mn 400/500    62.5kg    3K/2.5 Ca    P2  AVG HeRO   Hep none - Mircera 50 q 2 wks  (7/13)  Venofer 50 q wk - Parsabiv 558mIV q HDlower extremity edema, left upper arm swelling, nontender dialysis Access: Left AVG positive bruit, with upper arm swelling      Problem/Plan: Melena/Rectal bleeding - GI consulted. Hx rectal ulcers.  EGD 7/27 normal / flex sig =mucosal ulceration c/w sterocoral ulcers, non bleeding.  hgb low but stable 8.2  AMS: Palliative care consulted to establish goals of care. Pt is now DNR. Providers and palliative care met w/ family 8/08 and discussed pt's decline. Pt has had problems in the past w/ agitation on dialysis which has required a family sitter. Now her mental status is worse. From a dialysis perspective she is a poor candidate for OP HD given the combination of her physical and mental decline. Because of this we have recommended consideration of transition to hospice/ comfort care. Await f/u from pall care team.  ESRD: HD TTS here. Had HD yest 8/09.  Due to staffing issues we are doing HD just 2 times this week when possible. Next HD Saturday.  LUE edema: Had f'gram 6/23 which showed chronic thrombus and was started on Eliquis - currently  on hold due to #1. 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  Hypertension/volume  - BP soft. On midodrine for BP support.  Edema improving, UF as tolerated.  Under edw if weights correct. Nutrition: Albumin 2.6.,  Decrease p.o. intake  per notes.Albumin low. Continue protein supps.  Feeding tube placement as above put on hold till family meeting. 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 excess volume on exam Anemia  - Hgb trending down, last 8.5. Aranesp to1001mIV q Thursday. Nutrition: Albumin 2.6 decreased p.o. intake, feeding tube see #2 #2 Recent CVA with no evidence of meaningful recovery    Rob Matilyn Fehrman 06/05/2021, 9:43 AM   Recent Labs  Lab 05/31/21 1858 06/01/21 0326 06/02/21 0505 06/03/21 0441 06/04/21 0331  K 3.3*  --  3.1* 3.5 3.7  BUN 8  --  12 12 12   CREATININE 2.77*  --  4.10* 4.89* 4.15*  CALCIUM 7.8*  --  7.8* 8.0* 7.8*  PHOS 1.2*  1.2* 1.4*  --   --   --   HGB 8.2*  --  8.2*  --  8.1*    Inpatient medications:   stroke: mapping our early stages of recovery book   Does not apply Once   (feeding supplement) PROSource Plus  30 mL Oral BID BM   aspirin EC  81 mg Oral  Daily   atorvastatin  40 mg Oral Daily   collagenase   Topical Daily   [START ON 06/07/2021] darbepoetin (ARANESP) injection - DIALYSIS  100 mcg Intravenous Q Sat-HD   dextrose  12.5 g Intravenous STAT   feeding supplement (NEPRO CARB STEADY)  237 mL Oral TID BM   free water  150 mL Per Tube Q4H   gabapentin  100 mg Oral QHS   heparin injection (subcutaneous)  5,000 Units Subcutaneous Q8H   midodrine  10 mg Oral TID   mirtazapine  7.5 mg Oral QHS   multivitamin  1 tablet Oral QHS   pantoprazole  40 mg Oral Q0600   potassium chloride  40 mEq Oral Once   sodium chloride flush  3 mL Intravenous Q12H   thiamine injection  100 mg Intravenous Daily   venlafaxine XR  75 mg Oral Q breakfast    sodium chloride     feeding supplement (OSMOLITE 1.5 CAL)     sodium chloride, fentaNYL, lidocaine (PF),  midazolam, ondansetron (ZOFRAN) IV, sodium chloride flush

## 2021-06-05 NOTE — Progress Notes (Signed)
Blood sUGAR  57  gave 12.5 g.  Recheck cbg 116.  Dr made aware.

## 2021-06-05 NOTE — Progress Notes (Signed)
Patient ID: ALYXIA CENTEIO, female   DOB: Dec 27, 1945, 75 y.o.   MRN: [id number]    Progress Note from the Palliative Medicine Team at [facility]   Patient Name: Amy [name]        Date: 06/05/2021 DOB: 08/06/1946  Age: 75 y.o. MRN#: [id number] Attending Physician: Aline August, MD Primary Care Physician: [name], MD Admit Date: 05/19/2021    Medical records reviewed, spoke to attending, assessed patient, she is lethargic and unable to participate in conversation currently.   Not following commands   Poor oral intake, albumin 2.4.  She has recommendation for dysphagia 3 mechanical soft diet with thin liquids.  Outstanding decisions regarding treatment options and anticipatory care needs remain.    I spoke to family support person Amy [name] by telephone.   Continued conversation regarding current medical situation.  Education offered on patient's multiple comorbidities and continued physical, functional and cognitive decline in spite of full medical support.  Education offered on the limitations of medical interventions to prolong quality of life when the body fails to thrive.  Education offered on patient's high risk for decompensation.   Communicated treatment team's concerns regarding the patient's ability to tolerate or even be a candidate for outpatient dialysis secondary to increased weakness and intermittent confusion.   Safety is always a priority.    Amy [name] was very clear regarding family decisions regarding treatment plan.   Plan of care: -DNR/DNI -Continue hemodialysis as long as patient can tolerate -Continue to treat the treatable and hope for improvement - Request transfer to [facility] for second opinion   Questions and concerns addressed   Discussed with Dr  Starla Link and [facility] team vis secure chat  This nurse practitioner informed  the family and the attending that I will be out of the hospital for 10 days, palliative  medicine provider will continue to follow.  Please call with questions or concerns   Total time spent was 75 minutes  Greater than 50% of the time was spent in counseling and coordination of care  Wadie Lessen NP  Palliative Medicine Team Team Phone # [phone] Pager [phone]

## 2021-06-05 NOTE — Progress Notes (Signed)
Nutrition Follow-up  DOCUMENTATION CODES:   Severe malnutrition in context of chronic illness  INTERVENTION:  -d/c TF orders given no enteral access and no plans for TF at this time -Await result of GOC discussion(s) -Continue Nepro shakes po TID -Continue Magic Cup TID -Continue Rena-vite -Continue feeding assistance with meals -Continue Prosource Plus po BID   NUTRITION DIAGNOSIS:   Severe Malnutrition related to chronic illness (ESRD on HD, prior CVA) as evidenced by percent weight loss, severe muscle depletion, energy intake < or equal to 75% for > or equal to 1 month.  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  Due to pt's ongoing poor oral intake, IR was consulted for possible PEG tube placement but IR was unable to place because of overlying transverse colon. Cortrak order was placed so TF could be initiated while waiting on General surgery consultation for G-tube placement, but decision was made by MD to hold off on placement of both G-tube and Cortrak as pt no longer candidate for outpatient dialysis and GOC need to be addressed by family. Family currently undecided on GOC/plan of care are this time. Palliative Care is following. Note that if pt is not a candidate for dialysis as an outpatient, then pt is not an ideal candidate for nutrition support. Continue to provide pt with oral nutrition supplements, meal support, and encouragement while awaiting final GOC decisions from family. Meal completions charted as 0%, though pt accepting oral nutrition supplements. Note Nephrology has  recommended consideration of transition to hospice as pt's overall prognosis is very poor. Pt is still confused and remains a poor historian. Pt likely most appropriate for comfort feeds at this time. RD will continue to follow for [place] decision.   No UOP documented x24 hours I/O: +4.7L since admit  Medications: Scheduled Meds:   stroke: mapping our early stages of recovery book   Does not apply Once   (feeding supplement) PROSource Plus  30 mL Oral BID BM   aspirin EC  81 mg Oral Daily   atorvastatin  40 mg Oral Daily   collagenase   Topical Daily   [START ON 06/07/2021] darbepoetin (ARANESP) injection - DIALYSIS  100 mcg Intravenous Q Sat-HD   dextrose  12.5 g Intravenous STAT   feeding supplement (NEPRO CARB STEADY)  237 mL Oral TID BM   gabapentin  100 mg Oral QHS   heparin injection (subcutaneous)  5,000 Units Subcutaneous Q8H   midodrine  10 mg Oral TID   mirtazapine  7.5 mg Oral QHS   multivitamin  1 tablet Oral QHS   pantoprazole  40 mg Oral Q0600   sodium chloride flush  3 mL Intravenous Q12H   thiamine injection  100 mg Intravenous Daily   venlafaxine XR  75 mg Oral Q breakfast  Continuous Infusions:  sodium chloride     Labs: Recent Labs  Lab 05/31/21 0645 05/31/21 0645 05/31/21 1858 06/01/21 0326 06/02/21 0505 06/03/21 0441 06/04/21 0331  NA  --    < > 134*  --  134* 134*  134*  K  --    < > 3.3*  --  3.1* 3.5 3.7  CL  --    < > 96*  --  97* 97* 99  CO2  --    < > 27  --  _0 BUN  --    < > 8  --  _1 CREATININE  --    < > 2.77*  --  4.10* 4.89* 4.15*  CALCIUM  --    < > 7.8*  --  7.8* 8.0* 7.8*  MG 1.9  --  1.8 1.7 1.8 1.9  --   PHOS 1.9*  --  1.2*  1.2* 1.4*  --   --   --   GLUCOSE  --    < > 73  --  219* 156* 83   < > = values in this interval not displayed.  CBGs [id number]  Diet Order:   Diet Order             DIET DYS 3 Room service appropriate? Yes; Fluid consistency: Thin  Diet effective now                   EDUCATION  NEEDS:   Education needs have been addressed  Skin:  Skin Assessment: Skin Integrity Issues: Skin Integrity Issues:: Stage II Stage II: Pressure Injury on coccyx; Open wound on buttocks  Last BM:  8/11 type 6  Height:   Ht Readings from Last 1 Encounters:  05/20/21 _2  (1.702 m)    Weight:   Wt Readings from Last 1 Encounters:  06/03/21 59.5 kg    BMI:  Body mass index is 20.54 kg/m.  Estimated Nutritional Needs:   Kcal:  2000-2200  Protein:  70-85 grams  Fluid:  >2 L    Larkin Ina, MS, RD, LDN (she/her/hers) RD pager number and weekend/on-call pager number located in [place].

## 2021-06-06 DIAGNOSIS — N186 End stage renal disease: Secondary | ICD-10-CM | POA: Diagnosis not present

## 2021-06-06 DIAGNOSIS — E43 Unspecified severe protein-calorie malnutrition: Secondary | ICD-10-CM | POA: Diagnosis not present

## 2021-06-06 DIAGNOSIS — K922 Gastrointestinal hemorrhage, unspecified: Secondary | ICD-10-CM | POA: Diagnosis not present

## 2021-06-06 DIAGNOSIS — I10 Essential (primary) hypertension: Secondary | ICD-10-CM | POA: Diagnosis not present

## 2021-06-06 LAB — BASIC METABOLIC PANEL
Anion gap: 11 (ref 5–15)
BUN: 18 mg/dL (ref 8–23)
CO2: 26 mmol/L (ref 22–32)
Calcium: 8 mg/dL — ABNORMAL LOW (ref 8.9–10.3)
Chloride: 93 mmol/L — ABNORMAL LOW (ref 98–111)
Creatinine, Ser: 5.31 mg/dL — ABNORMAL HIGH (ref 0.44–1.00)
GFR, Estimated: 8 mL/min — ABNORMAL LOW (ref >60)
Glucose, Bld: 85 mg/dL (ref 70–99)
Potassium: 3.7 mmol/L (ref 3.5–5.1)
Sodium: 130 mmol/L — ABNORMAL LOW (ref 135–145)

## 2021-06-06 LAB — GLUCOSE, CAPILLARY
Glucose-Capillary: 87 mg/dL (ref 70–99)
Glucose-Capillary: 121 mg/dL — ABNORMAL HIGH (ref 70–99)
Glucose-Capillary: 92 mg/dL (ref 70–99)
Glucose-Capillary: 79 mg/dL (ref 70–99)

## 2021-06-06 LAB — CBC WITH DIFFERENTIAL/PLATELET
Abs Immature Granulocytes: 0.04 10*3/uL (ref 0.00–0.07)
Basophils Absolute: 0 10*3/uL (ref 0.0–0.1)
Basophils Relative: 1 %
Eosinophils Absolute: 0.3 10*3/uL (ref 0.0–0.5)
Eosinophils Relative: 5 %
HCT: 26.5 % — ABNORMAL LOW (ref 36.0–46.0)
Hemoglobin: 8.5 g/dL — ABNORMAL LOW (ref 12.0–15.0)
Immature Granulocytes: 1 %
Lymphocytes Relative: 19 %
Lymphs Abs: 1.1 10*3/uL (ref 0.7–4.0)
MCH: 31 pg (ref 26.0–34.0)
MCHC: 32.1 g/dL (ref 30.0–36.0)
MCV: 96.7 fL (ref 80.0–100.0)
Monocytes Absolute: 0.7 10*3/uL (ref 0.1–1.0)
Monocytes Relative: 12 %
Neutro Abs: 3.7 10*3/uL (ref 1.7–7.7)
Neutrophils Relative %: 62 %
Platelets: 240 10*3/uL (ref 150–400)
RBC: 2.74 MIL/uL — ABNORMAL LOW (ref 3.87–5.11)
RDW: 19.4 % — ABNORMAL HIGH (ref 11.5–15.5)
WBC: 5.8 10*3/uL (ref 4.0–10.5)
nRBC: 0.3 % — ABNORMAL HIGH (ref 0.0–0.2)

## 2021-06-06 MED ORDER — APIXABAN 2.5 MG PO TABS
2.5000 mg | ORAL_TABLET | Freq: Two times a day (BID) | ORAL | Status: DC
  Administered 2021-06-06 – 2021-06-08 (×3): 2.5 mg via ORAL
  Filled 2021-06-06 (×3): qty 1

## 2021-06-06 MED ORDER — MIDODRINE HCL 5 MG PO TABS
10.0000 mg | ORAL_TABLET | Freq: Three times a day (TID) | ORAL | Status: DC
  Administered 2021-06-06 – 2021-06-15 (×17): 10 mg via ORAL
  Filled 2021-06-06 (×19): qty 2

## 2021-06-06 NOTE — Progress Notes (Signed)
ANTICOAGULATION CONSULT NOTE - Initial Consult  Pharmacy Consult for Apixaban Indication: Chronic LUE DVT, hx CVA  Allergies  Allergen Reactions   Sensipar [Cinacalcet]     Per MAR   Statins     Per [facility]    Patient Measurements: Height: '5\' 7"'$  (170.2 cm) Weight: 59.5 kg (131 lb 2.8 oz) IBW/kg (Calculated) : 61.6  Vital Signs: Temp: 97.9 F (36.6 C) (08/12 1012) BP: 108/72 (08/12 1012) Pulse Rate: 105 (08/12 1012)  Labs: Recent Labs    06/04/21 0331 06/06/21 0337  HGB 8.1* 8.5*  HCT 25.5* 26.5*  PLT 199 240  CREATININE 4.15* 5.31*    Estimated Creatinine Clearance: 8.9 mL/min (A) (by C-G formula based on SCr of 5.31 mg/dL (H)).   Medical History: Past Medical History:  Diagnosis Date   Chronic kidney disease    dialysis M-W-F - dialysis cath located in right side of chest   Diabetes mellitus without complication ([place])    type 2 - no meds    HLD (hyperlipidemia)    diet controlled   Hypertension    no meds    Assessment: 24 YOF on Apixaban PTA for hx chronic UE thrombus (noted on 6/23 fistulogram) and hx CVA. This was held on admission for GIB work-up in the setting of melena/hematochezia, now okayed to resume if indicated per GI. Pharmacy consulted to dose.   Given the indication for chronic thrombus and hx CVA in the setting of recent concern for GIB, ESRD and low weight, would recommend if continuing to resume at lower dose of 2.5 mg bid. Discussed with TRH who is agreeable to this plan. Will plan to hold ASA with Apixaban restart as this will also cover for CVA prophylaxis.   Goal of Therapy:  Appropriate anticoagulation for indication and hepatic/renal function  Monitor platelets by anticoagulation protocol: Yes   Plan:  - Restart Apixaban at 2.5 mg bid (lower dose than PTA) - D/c ASA in addition to Hep SQ for VTE prophylaxis - Monitor for signs of bleeding and/or worsening anemia with restart  Thank you for allowing pharmacy to be a part of this  patient's care.  Alycia Rossetti, PharmD, BCPS Clinical Pharmacist Clinical phone for 06/06/2021: [phone] 06/06/2021 11:08 AM   **Pharmacist phone directory can now be found on [email or web address] ([email or web address]).  Listed under [place].

## 2021-06-06 NOTE — Progress Notes (Signed)
Nutrition Follow-up  DOCUMENTATION CODES:   Severe malnutrition in context of chronic illness  INTERVENTION:  -48-72 hour calorie count per MD (RD will follow-up with results on Monday, August 15) -Continue Nepro shakes po TID -Continue Magic Cup TID -Continue Rena-vite -Continue feeding assistance with meals -Continue Prosource Plus po BID   NUTRITION DIAGNOSIS:   Severe Malnutrition related to chronic illness (ESRD on HD, prior CVA) as evidenced by percent weight loss, severe muscle depletion, energy intake < or equal to 75% for > or equal to 1 month.  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  Discussed pt with MD. MD would like to have another calorie count conducted over the weekend so family can have more information when making decisions regarding [place]. Will discuss with RN. As per palliative care discussion with family on 06/05/2021: Family wants to continue to treat the treatable with hopes for improvement and to continue hemodialysis as long as patient can tolerate.  Pt has consumed 0% of last 8 meals offered.   No UOP documented x24 hours I/O: +5.139m since admit  Medications: Scheduled Meds:   stroke: mapping our early stages of recovery book   Does not apply Once   (feeding supplement) PROSource Plus  30 mL Oral BID BM   apixaban  2.5 mg Oral BID   atorvastatin  40 mg Oral Daily   collagenase   Topical Daily   [START ON 06/07/2021] darbepoetin (ARANESP) injection - DIALYSIS  100 mcg Intravenous Q Sat-HD   feeding supplement (NEPRO CARB STEADY)  237 mL Oral TID BM    gabapentin  100 mg Oral QHS   midodrine  10 mg Oral TID WC   mirtazapine  7.5 mg Oral QHS   multivitamin  1 tablet Oral QHS   pantoprazole  40 mg Oral Q0600   sodium chloride flush  3 mL Intravenous Q12H   thiamine injection  100 mg Intravenous Daily   venlafaxine XR  75 mg Oral Q breakfast  Continuous Infusions:  sodium chloride     dextrose 50 mL/hr at 06/06/21 0839   Labs: Recent Labs  Lab 05/31/21 0645 05/31/21 0645 05/31/21 1858 06/01/21 0326 06/02/21 0505 06/03/21 0441 06/04/21 0331 06/06/21 0337  NA  --    < > 134*  --  134* 134* 134* 130*  K  --    < > 3.3*  --  3.1* 3.5 3.7 3.7  CL  --    < > 96*  --  97* 97* 99 93*  CO2  --    < > 27  --  _0 BUN  --    < > 8  --  _1 CREATININE  --    < > 2.77*  --  4.10* 4.89* 4.15* 5.31*  CALCIUM  --    < > 7.8*  --  7.8* 8.0* 7.8* 8.0*  MG 1.9  --  1.8 1.7 1.8 1.9  --   --   PHOS 1.9*  --  1.2*  1.2* 1.4*  --   --   --   --   GLUCOSE  --    < > 73  --  219* 156* 83 85   < > = values in this interval not displayed.  CBGs [id number]  Diet Order:   Diet Order             DIET DYS 3 Room service appropriate? Yes; Fluid consistency: Thin  Diet effective now                   EDUCATION NEEDS:   Education needs have been addressed  Skin:  Skin Assessment: Skin Integrity Issues: Skin Integrity Issues:: Stage II Stage II: Pressure Injury on coccyx; Open wound on buttocks  Last BM:  8/12  Height:   Ht Readings from Last 1 Encounters:  05/20/21 _0  (1.702 m)    Weight:   Wt Readings from Last 1 Encounters:  06/03/21 59.5 kg    BMI:  Body mass index is 20.54 kg/m.  Estimated Nutritional Needs:   Kcal:  2000-2200  Protein:  70-85 grams  Fluid:  >2 L    Larkin Ina, MS, RD, LDN (she/her/hers) RD pager number and weekend/on-call pager number located in [place].

## 2021-06-06 NOTE — TOC Progression Note (Addendum)
Transition of Care [facility]) - Progression Note    Patient Details  Name: JEILANI PALM MRN: [id number] Date of Birth: [birth year]  Transition of Care [facility]) CM/SW [place], [place] Phone Number: 06/06/2021, 12:29 PM  Clinical Narrative:     CSW spoke with [employer]. Confirmed she was short term care with them 04/22/21 -- 05/19/21. Is in copay days. Pt may need LTACH but doesn't appear appropriate at this time. TOC will continue to follow.   Expected Discharge Plan: [place] Barriers to Discharge: Continued Medical Work up  Expected Discharge Plan and Services Expected Discharge Plan: [place] In-house Referral: Clinical Social Work     Living arrangements for the past 2 months: [place] (Patient came to hospital from [place])                                       Social Determinants of Health (SDOH) Interventions    Readmission Risk Interventions No flowsheet data found.

## 2021-06-06 NOTE — Progress Notes (Signed)
Patient ID: Amy [name], female   DOB: Feb 26, 1946, 75 y.o.   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  On presentation, hemoglobin was 9.6; GI was consulted.  She underwent EGD which was normal and flexible sigmoidoscopy which showed nonbleeding distal rectal ulcers/stercoral ulcers.  Subsequently, GI signed off. Given the hospitalizing, neurology was also consulted for altered mental status; MRI brain was similar to prior MRI at [facility] with a large right MCA stroke.  Nephrology following for dialysis needs; nephrology is stating that patient will not be accepted back to outpatient dialysis at this time due to her underlying confusion, weakness and unsafe to sit in a dialysis recliner as an outpatient.  Due to her poor oral intake, IR was consulted for possible PEG tube placement but IR was unable to place because of overlying transverse colon.  General surgery was consulted for G-tube placement but family currently undecided.  Palliative care following for goals of care discussion.  Assessment & Plan:   GI bleed secondary to rectal ulcers Anemia of chronic disease: From renal failure -Patient underwent EGD which was normal and flex sigmoidoscopy with clean base, nonbleeding distal rectal ulcers (stercoral ulcers).  Gastroenterology now signed off on 7/28. -Hemoglobin stable; 8.5 today.  No further reports of bleeding during this hospitalization.   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 -MRI brain findings were similar in appearance to her prior MRI at [facility] with no drastic changes which is notable for a large right MCA stroke with advanced microvascular ischemic changes of the white matter; MRA head degraded but did not show occlusion and bilateral carotid ultrasound revealed R ICA and LICA with [id number] stenosis.  EEG with cortical dysfunction right hemisphere likely secondary to underlying structural abnormality/stroke and moderate encephalopathy with no seizure activity or epileptiform discharges.  Hemoglobin A1c 5.1, LDL 68.  No further recommendations per neurology and signed off 05/28/2021. -Continue atorvastatin 40 mg p.o. daily -Diet as per SLP recommendations  Hyperlipidemia -Continue statin  Type 2 diabetes mellitus with episodes of hypoglycemia -Hemoglobin A1c 5.1.  Diet controlled at baseline. -Patient has had episodes of hypoglycemia due to poor oral intake.  She was started on D5 drip on 06/05/2021.  Monitor CBGs.   ESRD on HD -Currently per nephrology, not a candidate for continued outpatient HD given her confusion, weakness and inability to sit up in a chair for HD outpatient.  Overall HD is not enhancing her quality of life at this time. --Nephrology following, appreciate assistance continues on HD while inpatient --Per nephrology, Dr. Moshe Cipro will not accept back to outpatient dialysis at this time due to her underlying confusion, weakness and unsafe to sit in a outpatient dialysis recliner as she has been pulling at the HD needles prior -Nephrology is recommending consideration of transition to hospice.   -As per palliative care discussion with family on 06/05/2021: Family wants to continue to treat the treatable  with hopes for improvement and to continue hemodialysis as long as  patient can tolerate. -Patient will need either LTAC placement or another outpatient hemodialysis unit needs to be found.   Chronic thrombus left upper extremity -- Eliquis plan as above.   Hypotension: -Blood pressure currently is stable.  Continue midodrine  Stage II mid coccyx pressure injury, present on admission -Continue wound care  Acute metabolic encephalopathy/renal failure to thrive/generalized deconditioning/very poor oral intake Goals of care -Neurology has already signed off.  Overall prognosis is very poor. - Due to her poor oral intake, IR was consulted for PEG placement; unfortunately unable to place on 8/4 due to overlying transverse colon.  General surgery was consulted on 8/5 for surgical G-tube placement but currently on hold as family has not decided yet. -Palliative care following. -If condition does not improve, recommend total comfort measures. -Will request dietitian for calorie count.  Urinary tract infection -Completed 3-day course of ceftriaxone  Hepatic hypodensity -Incidental finding of an ovoid hepatic hypodensity anterior aspect of liver measuring up to 4.9 cm, could represent mass versus focal fatty infiltration.  Will need outpatient follow-up with MRI liver for further evaluation if goals of care remain aggressive.  Leukocytosis -Resolved  GERD -Continue PPI  DVT prophylaxis: Heparin subcutaneous Code Status: DNR Family Communication: None at bedside Disposition Plan: Status is: Inpatient  Remains inpatient appropriate because:Inpatient level of care appropriate due to severity of illness  Dispo: The patient is from: SNF              Anticipated d/c is to: SNF.  Might have to transfer her to [facility]              Patient currently is not medically stable to d/c.   Difficult to place patient No  Consultants: Neurology/palliative care/general surgery/IR/GI  Procedures:  EEG 8/1: Suggestive of cortical dysfunction right hemisphere likely  secondary to underlying structural abnormality/stroke, moderate encephalopathy, no seizure activity or epileptiform discharges. EGD 7/27, normal exam Flex sigmoidoscopy 7/27: 4 nonbleeding ulcers  Antimicrobials:  Anti-infectives (From admission, onward)    Start     Dose/Rate Route Frequency Ordered Stop   05/29/21 1622  ceFAZolin (ANCEF) 2-4 GM/100ML-% IVPB       Note to Pharmacy: [place]   : cabinet override      05/29/21 1622 05/30/21 0429   05/29/21 0000  cefTRIAXone (ROCEPHIN) 1 g in sodium chloride 0.9 % 100 mL IVPB  Status:  Discontinued        1 g 200 mL/hr over 30 Minutes Intravenous Every 24 hours 05/28/21 1801 05/28/21 1801   05/28/21 1830  cefTRIAXone (ROCEPHIN) 1 g in sodium chloride 0.9 % 100 mL IVPB        1 g 200 mL/hr over 30 Minutes Intravenous Every 24 hours 05/28/21 1801 05/30/21 1907   05/28/21 1630  ceFAZolin (ANCEF) IVPB 2g/100 mL premix        2 g 200 mL/hr over 30 Minutes Intravenous To Radiology 05/28/21 1537 05/29/21 1630        Subjective: Patient seen and examined at bedside.  Poor historian; confused.  No fever, vomiting or seizures reported by nursing staff.   Objective: Vitals:   06/05/21 0425 06/05/21 0859 06/05/21 1810 06/05/21 2127  BP: (!) 120/51 113/64 128/72 115/63  Pulse: 94 100 87 93  Resp: '18 17 17 18  '$ Temp: 97.9 F (36.6 C) 98 F (36.7 C) 97.8 F (36.6 C) 98.1 F (36.7 C)  TempSrc: Oral  Oral  SpO2: 92% 99% 100% 98%  Weight:      Height:        Intake/Output Summary (Last 24 hours) at 06/06/2021 0805 Last data filed at 06/06/2021 0000 Gross per 24 hour  Intake 455.61 ml  Output 0 ml  Net 455.61 ml    Filed Weights   06/02/21 2106 06/03/21 0958 06/03/21 1301  Weight: 59.4 kg 59.5 kg 59.5 kg    Examination:  General exam: Looks chronically ill and deconditioned.  Intermittently requiring 2 L oxygen via nasal cannula.  No acute distress.  Respiratory system: Bilateral decreased breath sounds at bases with  scattered crackles  cardiovascular system: S1-S2 heard, rate controlled Gastrointestinal system: Abdomen is mildly distended, soft and nontender.  Normal bowel sounds heard Extremities: No clubbing; lower extremity edema present bilaterally Central nervous system: Awake, confused; very slow to respond.  No focal neurological deficits.  Left-sided weakness present.   Skin: No obvious ecchymosis/other rashes Psychiatry: Cannot be assessed because of mental status  Data Reviewed: I have personally reviewed following labs and imaging studies  CBC: Recent Labs  Lab 05/31/21 1858 06/02/21 0505 06/04/21 0331 06/06/21 0337  WBC 10.8* 10.5 8.2 5.8  NEUTROABS  --   --   --  3.7  HGB 8.2* 8.2* 8.1* 8.5*  HCT 25.9* 25.7* 25.5* 26.5*  MCV 97.0 97.3 97.7 96.7  PLT 199 253 199 [id number]    Basic Metabolic Panel: Recent Labs  Lab 05/30/21 1631 05/31/21 0645 05/31/21 1858 06/01/21 0326 06/02/21 0505 06/03/21 0441 06/04/21 0331 06/06/21 0337  NA  --   --  134*  --  134* 134* 134* 130*  K  --   --  3.3*  --  3.1* 3.5 3.7 3.7  CL  --   --  96*  --  97* 97* 99 93*  CO2  --   --  27  --  '26 26 25 26  '$ GLUCOSE  --   --  73  --  219* 156* 83 85  BUN  --   --  8  --  '12 12 12 18  '$ CREATININE  --   --  2.77*  --  4.10* 4.89* 4.15* 5.31*  CALCIUM  --   --  7.8*  --  7.8* 8.0* 7.8* 8.0*  MG 1.9 1.9 1.8 1.7 1.8 1.9  --   --   PHOS 1.8* 1.9* 1.2*  1.2* 1.4*  --   --   --   --     GFR: Estimated Creatinine Clearance: 8.9 mL/min (A) (by C-G formula based on SCr of 5.31 mg/dL (H)). Liver Function Tests: Recent Labs  Lab 05/31/21 1858  ALBUMIN 2.6*    No results for input(s): LIPASE, AMYLASE in the last 168 hours. No results for input(s): AMMONIA in the last 168 hours. Coagulation Profile: No results for input(s): INR, PROTIME in the last 168 hours. Cardiac Enzymes: No results for input(s): CKTOTAL, CKMB, CKMBINDEX, TROPONINI in the last 168 hours. BNP (last 3 results) No results for input(s):  PROBNP in the last 8760 hours. HbA1C: No results for input(s): HGBA1C in the last 72 hours. CBG: Recent Labs  Lab 06/05/21 1116 06/05/21 1626 06/05/21 1647 06/05/21 2114 06/06/21 0401  GLUCAP 104* 57* 116* 85 87    Lipid Profile: No results for input(s): CHOL, HDL, LDLCALC, TRIG, CHOLHDL, LDLDIRECT in the last 72 hours. Thyroid Function Tests: No results for input(s): TSH, T4TOTAL, FREET4, T3FREE, THYROIDAB in the last 72 hours. Anemia Panel:  No results for input(s): VITAMINB12, FOLATE, FERRITIN, TIBC, IRON, RETICCTPCT in the last 72 hours. Sepsis Labs: No results for input(s): PROCALCITON, LATICACIDVEN in the last 168 hours.  No results found for this or any previous visit (from the past 240 hour(s)).       Radiology Studies: No results found.      Scheduled Meds:   stroke: mapping our early stages of recovery book   Does not apply Once   (feeding supplement) PROSource Plus  30 mL Oral BID BM   aspirin EC  81 mg Oral Daily   atorvastatin  40 mg Oral Daily   collagenase   Topical Daily   [START ON 06/07/2021] darbepoetin (ARANESP) injection - DIALYSIS  100 mcg Intravenous Q Sat-HD   feeding supplement (NEPRO CARB STEADY)  237 mL Oral TID BM   gabapentin  100 mg Oral QHS   heparin injection (subcutaneous)  5,000 Units Subcutaneous Q8H   midodrine  10 mg Oral TID   mirtazapine  7.5 mg Oral QHS   multivitamin  1 tablet Oral QHS   pantoprazole  40 mg Oral Q0600   sodium chloride flush  3 mL Intravenous Q12H   thiamine injection  100 mg Intravenous Daily   venlafaxine XR  75 mg Oral Q breakfast   Continuous Infusions:  sodium chloride     dextrose 50 mL/hr at 06/05/21 1656          Maragret Vanacker, MD [facility] 06/06/2021, 8:05 AM

## 2021-06-06 NOTE — Plan of Care (Signed)
  Problem: Safety: Goal: Ability to remain free from injury will improve Outcome: Progressing   Problem: Skin Integrity: Goal: Risk for impaired skin integrity will decrease Outcome: Progressing   

## 2021-06-06 NOTE — Progress Notes (Signed)
[facility] Progress Note  Subjective: seen in room  Vitals:   06/05/21 0859 06/05/21 1810 06/05/21 2127 06/06/21 1012  BP: 113/64 128/72 115/63 108/72  Pulse: 100 87 93 (!) 105  Resp: _0 Temp: 98 F (36.7 C) 97.8 F (36.6 C) 98.1 F (36.7 C) 97.9 F (36.6 C)  TempSrc:   Oral   SpO2: 99% 100% 98%   Weight:      Height:        Exam: General: Thin chronically ill-appearing female in NAD Heart: RRR no MRG Lungs: CTA, Nonlabored breathing Abdomen: Bowel sounds positive no acute, soft nontender nondistended Extremities: no sig LE edema, focal left upper arm swelling, nontender Dialysis Access: LUA AVG+bruit       Dialysis:  AF TTS here 3h 40mn 400/500    62.5kg    3K/2.5 Ca    P2  AVG HeRO   Hep none - Mircera 50 q 2 wks  (7/13)  Venofer 50 q wk - Parsabiv 578mIV q HDlower extremity edema, left upper arm swelling, nontender dialysis Access: Left AVG positive bruit, with upper arm swelling   Assessment/ Plan Melena/Rectal bleeding - GI consulted. Hx rectal ulcers.  EGD 7/27 normal / flex sig =mucosal ulceration c/w sterocoral ulcers, non bleeding.  hgb low but stable 8.2  AMS: Palliative care consulted to establish goals of care. Pt is now DNR. Providers and palliative care met w/ family 8/08 and discussed pt's decline. Due to declining mental and physical status we don't anticipate she will be able to do OP dialysis. Family wants to continue dialysis as of today 8/11. Pt will have to be able to sit in a chair for 4-6 hrs to qualify for OP HD w/ our group. If she is able to sit for dialysis and cooperate, then the next step would to re-CLIP the patient as her OP HD unit will not take her back.  ESRD: HD TTS here. Had HD here 8/09.  Due to staffing issues we are doing HD just 2 times this week when possible. HD Tues/ Sat here this week.  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  Hypertension/volume  - BP soft. On midodrine for BP support.  Edema improving, UF as tolerated.  Under edw if weights correct. Nutrition: Albumin 2.6.,  Decrease p.o. intake  per notes.Albumin low. Continue protein supps.  Feeding tube placement as above put on hold till family meeting. 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 excess volume on exam Anemia  - Hgb trending down, last 8.5. Aranesp to10051mIV q Thursday. Nutrition: Albumin 2.6 decreased p.o. intake, feeding tube see #2 #2 Recent CVA with no evidence of meaningful recovery    Rob Ridhi Hoffert 06/06/2021, 1:15 PM   Recent Labs  Lab 05/31/21 1858 06/01/21 0326 06/02/21 0505 06/04/21 0331 06/06/21 0337  K 3.3*  --    < > 3.7 3.7  BUN 8  --    < > 12 18  CREATININE 2.77*  --    < > 4.15* 5.31*  CALCIUM 7.8*  --    < > 7.8* 8.0*  PHOS 1.2*  1.2* 1.4*  --   --   --   HGB 8.2*  --    < > 8.1* 8.5*   < > = values in this interval not displayed.    Inpatient medications:  stroke: mapping our early stages of recovery book   Does not apply Once   (feeding supplement) PROSource Plus  30 mL Oral BID BM   apixaban  2.5 mg Oral BID   atorvastatin  40 mg Oral Daily   collagenase   Topical Daily   [START ON 06/07/2021] darbepoetin (ARANESP) injection - DIALYSIS  100 mcg Intravenous Q Sat-HD   feeding supplement (NEPRO CARB STEADY)  237 mL Oral TID BM   gabapentin  100 mg Oral QHS   midodrine  10 mg Oral TID WC   mirtazapine  7.5 mg Oral QHS   multivitamin  1 tablet Oral QHS   pantoprazole  40 mg Oral Q0600   sodium chloride flush  3 mL Intravenous Q12H   thiamine injection  100 mg Intravenous Daily   venlafaxine XR  75 mg Oral Q breakfast    sodium chloride     dextrose 50 mL/hr at 06/06/21 0839   sodium chloride, fentaNYL, lidocaine (PF), midazolam, ondansetron (ZOFRAN) IV, sodium chloride flush

## 2021-06-07 DIAGNOSIS — K922 Gastrointestinal hemorrhage, unspecified: Secondary | ICD-10-CM | POA: Diagnosis not present

## 2021-06-07 DIAGNOSIS — Z992 Dependence on renal dialysis: Secondary | ICD-10-CM | POA: Diagnosis not present

## 2021-06-07 DIAGNOSIS — N186 End stage renal disease: Secondary | ICD-10-CM | POA: Diagnosis not present

## 2021-06-07 LAB — GLUCOSE, CAPILLARY
Glucose-Capillary: 139 mg/dL — ABNORMAL HIGH (ref 70–99)
Glucose-Capillary: 66 mg/dL — ABNORMAL LOW (ref 70–99)
Glucose-Capillary: 107 mg/dL — ABNORMAL HIGH (ref 70–99)
Glucose-Capillary: 108 mg/dL — ABNORMAL HIGH (ref 70–99)

## 2021-06-07 MED ORDER — DARBEPOETIN ALFA 100 MCG/0.5ML IJ SOSY
PREFILLED_SYRINGE | INTRAMUSCULAR | Status: AC
  Administered 2021-06-07: 100 ug via INTRAVENOUS
  Filled 2021-06-07: qty 0.5

## 2021-06-07 MED ORDER — DEXTROSE 50 % IV SOLN: 12.5000 g | INTRAVENOUS | Status: AC

## 2021-06-07 MED ORDER — DEXTROSE 50 % IV SOLN
INTRAVENOUS | Status: AC
  Administered 2021-06-07: 25 mL
  Filled 2021-06-07: qty 50

## 2021-06-07 NOTE — Progress Notes (Signed)
[facility] Progress Note   Subjective:    Seen and examined patient at bedside. Completed HD today-tolerated net UF 500cc. Patient also in recliner. Appears comfortable. Plan for HD 06/09/21 per her usual schedule.  Objective Vitals:   06/07/21 1000 06/07/21 1030 06/07/21 1130 06/07/21 1200  BP: (!) 99/57 95/60 (!) 88/57 108/64  Pulse: 96 96 (!) 102 97  Resp:    18  Temp:    (!) 97.1 F (36.2 C)  TempSrc:    Axillary  SpO2: 98% 97% 98% 97%  Weight:      Height:       Physical Exam General:Chronically ill-appearing female; NAD Heart: S1/S2; No murmurs, gallops, or rubs Lungs: Clear throughout; No wheezing, rales, or friction rub Abdomen: Soft, non-tender, (+) BS Extremities: No edema BLLE Dialysis Access:  L AVG (+) Bruit/Thrill   Filed Weights   06/03/21 0958 06/03/21 1301 06/07/21 0433  Weight: 59.5 kg 59.5 kg 56 kg    Intake/Output Summary (Last 24 hours) at 06/07/2021 1824 Last data filed at 06/07/2021 1600 Gross per 24 hour  Intake 2054.33 ml  Output 501 ml  Net 1553.33 ml    Additional Objective Labs: Basic Metabolic Panel: Recent Labs  Lab 05/31/21 1858 06/01/21 0326 06/02/21 0505 06/03/21 0441 06/04/21 0331 06/06/21 0337  NA 134*  --    < > 134* 134* 130*  K 3.3*  --    < > 3.5 3.7 3.7  CL 96*  --    < > 97* 99 93*  CO2 27  --    < > 26 25 26   GLUCOSE 73  --    < > 156* 83 85  BUN 8  --    < > 12 12 18   CREATININE 2.77*  --    < > 4.89* 4.15* 5.31*  CALCIUM 7.8*  --    < > 8.0* 7.8* 8.0*  PHOS 1.2*  1.2* 1.4*  --   --   --   --    < > = values in this interval not displayed.   Liver Function Tests: Recent Labs  Lab 05/31/21 1858  ALBUMIN 2.6*   No results for input(s): LIPASE, AMYLASE in the last 168 hours. CBC: Recent Labs  Lab 05/31/21 1858 06/02/21 0505 06/04/21 0331 06/06/21 0337  WBC 10.8* 10.5 8.2 5.8  NEUTROABS  --   --   --  3.7  HGB 8.2* 8.2* 8.1* 8.5*  HCT 25.9* 25.7* 25.5* 26.5*  MCV 97.0 97.3 97.7 96.7   PLT 199 253 199 240   Blood Culture    Component Value Date/Time   SDES URINE, CATHETERIZED 05/02/2021 0022   SPECREQUEST NONE 05/02/2021 0022   CULT  05/02/2021 0022    NO GROWTH Performed at [facility], [place] [address], [place], [place] [address]    REPTSTATUS 05/04/2021 FINAL 05/02/2021 0022    Cardiac Enzymes: No results for input(s): CKTOTAL, CKMB, CKMBINDEX, TROPONINI in the last 168 hours. CBG: Recent Labs  Lab 06/06/21 2103 06/07/21 0636 06/07/21 0659 06/07/21 1232 06/07/21 1623  GLUCAP 79 66* 139* 107* 108*   Iron Studies: No results for input(s): IRON, TIBC, TRANSFERRIN, FERRITIN in the last 72 hours. Lab Results  Component Value Date   INR 0.9 03/10/2021   INR 1.0 01/26/2021   Studies/Results: No results found.  Medications:  sodium chloride     dextrose 50 mL/hr at 06/07/21 0442     stroke: mapping our early stages of recovery book  Does not apply Once   (feeding supplement) PROSource Plus  30 mL Oral BID BM   apixaban  2.5 mg Oral BID   atorvastatin  40 mg Oral Daily   collagenase   Topical Daily   darbepoetin (ARANESP) injection - DIALYSIS  100 mcg Intravenous Q Sat-HD   dextrose  12.5 g Intravenous STAT   feeding supplement (NEPRO CARB STEADY)  237 mL Oral TID BM   gabapentin  100 mg Oral QHS   midodrine  10 mg Oral TID WC   mirtazapine  7.5 mg Oral QHS   multivitamin  1 tablet Oral QHS   pantoprazole  40 mg Oral Q0600   sodium chloride flush  3 mL Intravenous Q12H   thiamine injection  100 mg Intravenous Daily   venlafaxine XR  75 mg Oral Q breakfast    Dialysis Orders: AF TTS here 3h 45mn 400/500    62.5kg    3K/2.5 Ca    P2  AVG HeRO   Hep none - Mircera 50 q 2 wks  (7/13)  Venofer 50 q wk - Parsabiv 57mIV q HDlower extremity edema, left upper arm swelling, nontender dialysis Access: Left AVG positive bruit, with upper arm swelling  Assessment/Plan: Melena/Rectal bleeding - GI consulted. Hx rectal ulcers.  EGD 7/27 normal  / flex sig =mucosal ulceration c/w sterocoral ulcers, non bleeding.  hgb low but stable 8.5 AMS: Palliative care consulted to establish goals of care. Pt is now DNR. Providers and palliative care met w/ family 8/08 and discussed pt's decline. Due to declining mental and physical status we don't anticipate she will be able to do OP dialysis. Family wants to continue dialysis as of today 8/11. Pt will have to be able to sit in a chair for 4-6 hrs to qualify for OP HD w/ our group. If she is able to sit for dialysis and cooperate, then the next step would to re-CLIP the patient as her OP HD unit will not take her back.  ESRD: HD TTS here (usual MWF outpatient). Due to staffing issues we are doing HD just 2 times this week when possible. HD Tues/ Sat here this week-HD today-tolerated net UF 500cc. Plan for HD 06/09/21 per her usual schedule. 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  Hypertension/volume  - BP soft. On midodrine for BP support.  Edema improving, UF as tolerated.  Under edw if weights correct. Nutrition: Albumin 2.6.,  Decrease p.o. intake  per notes. Albumin low. Continue protein supps.  Feeding tube placement as above put on hold till family meeting. 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 excess volume on exam Anemia  - Hgb trending down, last 8.5. Aranesp to10087mIV q Thursday. Nutrition: Albumin 2.6 decreased p.o. intake, feeding tube see #2 #2 Recent CVA with no evidence of meaningful recovery  CouTobie PoetP CarBostonia13/2022,6:24 PM  LOS: 18 days

## 2021-06-07 NOTE — Progress Notes (Signed)
Hypoglycemic Event  CBG:  Results for COUSINThyda, Maberry (MRN [id number]) as of 06/07/2021 06:43  Ref. Range 06/07/2021 06:36  Glucose-Capillary Latest Ref Range: 70 - 99 mg/dL 66 (L)   Treatment: D50 25 mL (12.5 gm)  Symptoms: None  Follow-up CBG: Time: CBG Result: Results for COUSINAarini, Ketchmark (MRN [id number]) as of 06/07/2021 07:19  Ref. Range 06/07/2021 06:59  Glucose-Capillary Latest Ref Range: 70 - 99 mg/dL 139 (H)    Possible Reasons for Event: Inadequate meal intake  Comments/MD notified:   Viviano Simas

## 2021-06-07 NOTE — Progress Notes (Signed)
Patient ID: Amy [name], female   DOB: [birth year], 75 y.o.   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  On presentation, hemoglobin was 9.6; GI was consulted.  She underwent EGD which was normal and flexible sigmoidoscopy which showed nonbleeding distal rectal ulcers/stercoral ulcers.  Subsequently, GI signed off. Given the hospitalizing, neurology was also consulted for altered mental status; MRI brain was similar to prior MRI at [facility] with a large right MCA stroke.  Nephrology following for dialysis needs; nephrology is stating that patient will not be accepted back to outpatient dialysis at this time due to her underlying confusion, weakness and unsafe to sit in a dialysis recliner as an outpatient.  Due to her poor oral intake, IR was consulted for possible PEG tube placement but IR was unable to place because of overlying transverse colon.  General surgery was consulted for G-tube placement but family currently undecided.  Palliative care following for goals of care discussion.  Assessment & Plan:   GI bleed secondary to rectal ulcers Anemia of chronic disease: From renal failure -Patient underwent EGD which was normal and flex sigmoidoscopy with clean base, nonbleeding distal rectal ulcers (stercoral ulcers).  Gastroenterology now signed off on 7/28. -Hemoglobin stable currently.  No further reports of bleeding during this hospitalization.   -Okay to  restart Eliquis per GI -Eliquis has been restarted.  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 -MRI brain findings were similar in appearance to her prior MRI at [facility] with no drastic changes which is notable for a large right MCA stroke with advanced microvascular ischemic changes of the white matter; MRA head degraded but did not show occlusion and bilateral carotid ultrasound revealed R ICA and LICA with [id number] stenosis.  EEG with cortical dysfunction right hemisphere likely secondary to underlying structural abnormality/stroke and moderate encephalopathy with no seizure activity or epileptiform discharges.  Hemoglobin A1c 5.1, LDL 68.  No further recommendations per neurology and signed off 05/28/2021. -Continue atorvastatin 40 mg p.o. daily -Diet as per SLP recommendations  Hyperlipidemia -Continue statin  Type 2 diabetes mellitus with episodes of hypoglycemia -Hemoglobin A1c 5.1.  Diet controlled at baseline. -Patient has had episodes of hypoglycemia due to poor oral intake.  She was started on D5 drip on 06/05/2021.  Monitor CBGs.   ESRD on HD -Currently per nephrology, not a candidate for continued outpatient HD given her confusion, weakness and inability to sit up in a chair for HD outpatient.  Overall HD is not enhancing her quality of life at this time. --Nephrology following, appreciate assistance continues on HD while inpatient --Per nephrology, Dr. Moshe Cipro will not accept back to outpatient dialysis at this time due to her underlying confusion, weakness and unsafe to sit in a outpatient dialysis recliner as she has been pulling at the HD needles prior -Nephrology is recommending consideration of transition to hospice.   -As per palliative care discussion with family on 06/05/2021: Family wants to continue to treat the treatable with  hopes for improvement and to continue hemodialysis as long as  patient can tolerate. -Patient will need either LTAC placement or another outpatient hemodialysis unit needs to be found.   Chronic thrombus left upper extremity -- Eliquis plan as above.   Hypotension: -Blood pressure currently is stable.  Continue midodrine  Stage II mid coccyx pressure injury, present on admission -Continue wound care  Acute metabolic encephalopathy/renal failure to thrive/generalized deconditioning/very poor oral intake Goals of care -Neurology has already signed off.  Overall prognosis is very poor. - Due to her poor oral intake, IR was consulted for PEG placement; unfortunately unable to place on 8/4 due to overlying transverse colon.  General surgery was consulted on 8/5 for surgical G-tube placement but currently on hold as family has not decided yet. -Palliative care following. -If condition does not improve, recommend total comfort measures. -Oral intake is still very poor.  I have requested dietitian consult for calorie count starting on 06/06/2021 onwards.  If still remains poor, will have to proceed with G-tube placement by general surgery if family still wants it.  Urinary tract infection -Completed 3-day course of ceftriaxone  Hepatic hypodensity -Incidental finding of an ovoid hepatic hypodensity anterior aspect of liver measuring up to 4.9 cm, could represent mass versus focal fatty infiltration.  Will need outpatient follow-up with MRI liver for further evaluation if goals of care remain aggressive.  Leukocytosis -Resolved  GERD -Continue PPI  DVT prophylaxis: Heparin subcutaneous Code Status: DNR Family Communication: None at bedside Disposition Plan: Status is: Inpatient  Remains inpatient appropriate because:Inpatient level of care appropriate due to severity of illness  Dispo: The patient is from: SNF              Anticipated d/c is to: SNF.  Might have to transfer her to [facility]              Patient currently is not medically stable to  d/c.   Difficult to place patient No  Consultants: Neurology/palliative care/general surgery/IR/GI  Procedures:  EEG 8/1: Suggestive of cortical dysfunction right hemisphere likely secondary to underlying structural abnormality/stroke, moderate encephalopathy, no seizure activity or epileptiform discharges. EGD 7/27, normal exam Flex sigmoidoscopy 7/27: 4 nonbleeding ulcers  Antimicrobials:  Anti-infectives (From admission, onward)    Start     Dose/Rate Route Frequency Ordered Stop   05/29/21 1622  ceFAZolin (ANCEF) 2-4 GM/100ML-% IVPB       Note to Pharmacy: Lytle Butte   : cabinet override      05/29/21 1622 05/30/21 0429   05/29/21 0000  cefTRIAXone (ROCEPHIN) 1 g in sodium chloride 0.9 % 100 mL IVPB  Status:  Discontinued        1 g 200 mL/hr over 30 Minutes Intravenous Every 24 hours 05/28/21 1801 05/28/21 1801   05/28/21 1830  cefTRIAXone (ROCEPHIN) 1 g in sodium chloride 0.9 % 100 mL IVPB        1 g 200 mL/hr over 30 Minutes Intravenous Every 24 hours 05/28/21 1801 05/30/21 1907   05/28/21 1630  ceFAZolin (ANCEF) IVPB 2g/100 mL premix        2 g 200 mL/hr over 30 Minutes Intravenous To Radiology 05/28/21 1537 05/29/21 1630        Subjective: Patient seen and examined at bedside.  Poor historian; confused.  No vomiting, seizures or fever reported per nursing staff.  Oral intake is still very poor.   Objective: Vitals:   06/06/21 1012 06/06/21 1741 06/06/21 2105 06/07/21 0433  BP: 108/72  116/74 (!) 114/52 101/61  Pulse: (!) 105 96 92 96  Resp: '17 17 16 18  '$ Temp: 97.9 F (36.6 C) 98.1 F (36.7 C) 97.6 F (36.4 C) 98.1 F (36.7 C)  TempSrc:  Oral Oral Oral  SpO2:  99% 100% 100%  Weight:    56 kg  Height:        Intake/Output Summary (Last 24 hours) at 06/07/2021 0806 Last data filed at 06/07/2021 0600 Gross per 24 hour  Intake 1559.56 ml  Output 1 ml  Net 1558.56 ml    Filed Weights   06/03/21 0958 06/03/21 1301 06/07/21 0433  Weight: 59.5 kg 59.5 kg  56 kg    Examination:  General exam: Looks chronically ill and deconditioned.  No distress.  On room air currently.   Respiratory system: Decreased breath sounds at bases bilaterally with some crackles  cardiovascular system: Rate controlled, S1-S2 heard gastrointestinal system: Abdomen is distended slightly, soft and nontender.  Bowel sounds are heard  extremities: Bilateral lower extremity edema present; no cyanosis Central nervous system: Extremely slow to respond; confused.  No focal neurological deficits.  Left-sided weakness present.   Skin: No obvious petechiae/other lesions  psychiatry: Could not be assessed because of mental status  Data Reviewed: I have personally reviewed following labs and imaging studies  CBC: Recent Labs  Lab 05/31/21 1858 06/02/21 0505 06/04/21 0331 06/06/21 0337  WBC 10.8* 10.5 8.2 5.8  NEUTROABS  --   --   --  3.7  HGB 8.2* 8.2* 8.1* 8.5*  HCT 25.9* 25.7* 25.5* 26.5*  MCV 97.0 97.3 97.7 96.7  PLT 199 253 199 [id number]    Basic Metabolic Panel: Recent Labs  Lab 05/31/21 1858 06/01/21 0326 06/02/21 0505 06/03/21 0441 06/04/21 0331 06/06/21 0337  NA 134*  --  134* 134* 134* 130*  K 3.3*  --  3.1* 3.5 3.7 3.7  CL 96*  --  97* 97* 99 93*  CO2 27  --  '26 26 25 26  '$ GLUCOSE 73  --  219* 156* 83 85  BUN 8  --  '12 12 12 18  '$ CREATININE 2.77*  --  4.10* 4.89* 4.15* 5.31*  CALCIUM 7.8*  --  7.8* 8.0* 7.8* 8.0*  MG 1.8 1.7 1.8 1.9  --   --   PHOS 1.2*  1.2* 1.4*  --   --   --   --     GFR: Estimated Creatinine Clearance: 8.3 mL/min (A) (by C-G formula based on SCr of 5.31 mg/dL (H)). Liver Function Tests: Recent Labs  Lab 05/31/21 1858  ALBUMIN 2.6*    No results for input(s): LIPASE, AMYLASE in the last 168 hours. No results for input(s): AMMONIA in the last 168 hours. Coagulation Profile: No results for input(s): INR, PROTIME in the last 168 hours. Cardiac Enzymes: No results for input(s): CKTOTAL, CKMB, CKMBINDEX, TROPONINI in the last  168 hours. BNP (last 3 results) No results for input(s): PROBNP in the last 8760 hours. HbA1C: No results for input(s): HGBA1C in the last 72 hours. CBG: Recent Labs  Lab 06/06/21 1131 06/06/21 1809 06/06/21 2103 06/07/21 0636 06/07/21 0659  GLUCAP 121* 92 79 66* 139*    Lipid Profile: No results for input(s): CHOL, HDL, LDLCALC, TRIG, CHOLHDL, LDLDIRECT in the last 72 hours. Thyroid Function Tests: No results for input(s): TSH, T4TOTAL, FREET4, T3FREE, THYROIDAB in the last 72 hours. Anemia Panel: No results for input(s): VITAMINB12, FOLATE, FERRITIN, TIBC, IRON, RETICCTPCT in the last 72 hours. Sepsis Labs:  No results for input(s): PROCALCITON, LATICACIDVEN in the last 168 hours.  No results found for this or any previous visit (from the past 240 hour(s)).       Radiology Studies: No results found.      Scheduled Meds:   stroke: mapping our early stages of recovery book   Does not apply Once   (feeding supplement) PROSource Plus  30 mL Oral BID BM   apixaban  2.5 mg Oral BID   atorvastatin  40 mg Oral Daily   collagenase   Topical Daily   darbepoetin (ARANESP) injection - DIALYSIS  100 mcg Intravenous Q Sat-HD   dextrose  12.5 g Intravenous STAT   feeding supplement (NEPRO CARB STEADY)  237 mL Oral TID BM   gabapentin  100 mg Oral QHS   midodrine  10 mg Oral TID WC   mirtazapine  7.5 mg Oral QHS   multivitamin  1 tablet Oral QHS   pantoprazole  40 mg Oral Q0600   sodium chloride flush  3 mL Intravenous Q12H   thiamine injection  100 mg Intravenous Daily   venlafaxine XR  75 mg Oral Q breakfast   Continuous Infusions:  sodium chloride     dextrose 50 mL/hr at 06/07/21 0442          Aline August, MD [facility] 06/07/2021, 8:06 AM

## 2021-06-07 NOTE — Progress Notes (Signed)
Awake  most  of the night. Remains very confused and calling out. Redirected but with no avail. Turned and repositioned.

## 2021-06-07 NOTE — Plan of Care (Signed)
  Problem: Skin Integrity: Goal: Risk for impaired skin integrity will decrease Outcome: Progressing   

## 2021-06-08 DIAGNOSIS — I10 Essential (primary) hypertension: Secondary | ICD-10-CM | POA: Diagnosis not present

## 2021-06-08 DIAGNOSIS — I6330 Cerebral infarction due to thrombosis of unspecified cerebral artery: Secondary | ICD-10-CM | POA: Diagnosis not present

## 2021-06-08 DIAGNOSIS — N186 End stage renal disease: Secondary | ICD-10-CM | POA: Diagnosis not present

## 2021-06-08 DIAGNOSIS — K922 Gastrointestinal hemorrhage, unspecified: Secondary | ICD-10-CM | POA: Diagnosis not present

## 2021-06-08 LAB — GLUCOSE, CAPILLARY
Glucose-Capillary: 105 mg/dL — ABNORMAL HIGH (ref 70–99)
Glucose-Capillary: 115 mg/dL — ABNORMAL HIGH (ref 70–99)
Glucose-Capillary: 100 mg/dL — ABNORMAL HIGH (ref 70–99)
Glucose-Capillary: 101 mg/dL — ABNORMAL HIGH (ref 70–99)

## 2021-06-08 NOTE — Plan of Care (Signed)
  Problem: Skin Integrity: Goal: Risk for impaired skin integrity will decrease Outcome: Progressing   

## 2021-06-08 NOTE — Progress Notes (Signed)
[facility] Progress Note   Subjective:    Seen and examined at bedside. No acute changes. Plan for HD 06/09/21.  Objective Vitals:   06/07/21 1130 06/07/21 1200 06/08/21 0538 06/08/21 0839  BP: (!) 88/57 108/64 (!) 114/51 107/67  Pulse: (!) 102 97 93 88  Resp:  _0 Temp:  (!) 97.1 F (36.2 C) 97.8 F (36.6 C) 97.8 F (36.6 C)  TempSrc:  Axillary Axillary   SpO2: 98% 97% 100% 98%  Weight:   57 kg   Height:       Physical Exam General:Chronically ill-appearing female; NAD Heart: S1/S2; No murmurs, gallops, or rubs Lungs: Clear throughout; No wheezing, rales, or friction rub Abdomen: Soft, non-tender, (+) BS Extremities: Trace edema R hip. No edema BLLE Dialysis Access:  L AVG (+) Bruit/Thrill   Filed Weights   06/03/21 1301 06/07/21 0433 06/08/21 0538  Weight: 59.5 kg 56 kg 57 kg    Intake/Output Summary (Last 24 hours) at 06/08/2021 1316 Last data filed at 06/08/2021 0800 Gross per 24 hour  Intake 494.77 ml  Output --  Net 494.77 ml    Additional Objective Labs: Basic Metabolic Panel: Recent Labs  Lab 06/03/21 0441 06/04/21 0331 06/06/21 0337  NA 134* 134* 130*  K 3.5 3.7 3.7  CL 97* 99 93*  CO2 _1 GLUCOSE 156* 83 85  BUN _2 CREATININE 4.89* 4.15* 5.31*  CALCIUM 8.0* 7.8* 8.0*   Liver Function Tests: No results for input(s): AST, ALT, ALKPHOS, BILITOT, PROT, ALBUMIN in the last 168 hours. No results for input(s): LIPASE, AMYLASE in the last 168 hours. CBC: Recent Labs  Lab 06/02/21 0505 06/04/21 0331 06/06/21 0337  WBC 10.5 8.2 5.8  NEUTROABS  --   --  3.7  HGB 8.2* 8.1* 8.5*  HCT 25.7* 25.5* 26.5*  MCV 97.3 97.7 96.7  PLT 253 199 240   Blood Culture    Component Value Date/Time   SDES URINE, CATHETERIZED 05/02/2021 0022   SPECREQUEST NONE 05/02/2021 0022   CULT  05/02/2021 0022    NO GROWTH Performed at [facility], [place] [address], [place], [place] [address]    REPTSTATUS 05/04/2021 FINAL  05/02/2021 0022    Cardiac Enzymes: No results for input(s): CKTOTAL, CKMB, CKMBINDEX, TROPONINI in the last 168 hours. CBG: Recent Labs  Lab 06/07/21 0659 06/07/21 1232 06/07/21 1623 06/08/21 0636 06/08/21 1145  GLUCAP 139* 107* 108* 105* 115*   Iron Studies: No results for input(s): IRON, TIBC, TRANSFERRIN, FERRITIN in the last 72 hours. Lab Results  Component Value Date   INR 0.9 03/10/2021   INR 1.0 01/26/2021   Studies/Results: No results found.  Medications:  sodium chloride     dextrose 50 mL/hr at 06/08/21 1119     stroke: mapping our early stages of recovery book   Does not apply Once   (feeding supplement) PROSource Plus  30 mL Oral BID BM   apixaban  2.5 mg Oral BID   atorvastatin  40 mg Oral Daily   collagenase   Topical Daily   darbepoetin (ARANESP) injection - DIALYSIS  100 mcg Intravenous Q Sat-HD   feeding supplement (NEPRO CARB STEADY)  237 mL Oral TID BM   gabapentin  100 mg Oral QHS   midodrine  10 mg Oral TID WC   mirtazapine  7.5 mg Oral QHS   multivitamin  1 tablet Oral QHS   pantoprazole  40 mg Oral Q0600  sodium chloride flush  3 mL Intravenous Q12H   thiamine injection  100 mg Intravenous Daily   venlafaxine XR  75 mg Oral Q breakfast    Dialysis Orders: AF TTS here 3h 36mn 400/500    62.5kg    3K/2.5 Ca    P2  AVG HeRO   Hep none - Mircera 50 q 2 wks  (7/13)  Venofer 50 q wk - Parsabiv 575mIV q HDlower extremity edema, left upper arm swelling, nontender dialysis Access: Left AVG positive bruit, with upper arm swelling  Assessment/Plan: Melena/Rectal bleeding - GI consulted. Hx rectal ulcers.  EGD 7/27 normal / flex sig =mucosal ulceration c/w sterocoral ulcers, non bleeding.  hgb low but stable 8.5 AMS: Palliative care consulted to establish goals of care. Pt is now DNR. Providers and palliative care met w/ family 8/08 and discussed pt's decline. Due to declining mental and physical status we don't anticipate she will be able to do OP  dialysis. Family wants to continue dialysis as of today 8/11. Pt will have to be able to sit in a chair for 4-6 hrs to qualify for OP HD w/ our group. If she is able to sit for dialysis and cooperate, then the next step would to re-CLIP the patient as her OP HD unit will not take her back. Plan to have patient in recliner for next HD 06/09/21. Will discuss this with HD staff to ensure its coordinated with unit RN. ESRD: HD TTS here (usual MWF outpatient). Due to staffing issues this week, we are doing HD just 2 times when possible. HD Tues/ Sat here this week-Tolerated yesterday's HD with net UF 500cc. Plan for HD 06/09/21 per her usual schedule. 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  Hypertension/volume  - BP soft. On midodrine for BP support.  Edema improving, UF as tolerated.  Under edw if weights correct. Nutrition: Albumin 2.6.,  Decrease p.o. intake  per notes. Albumin low. Continue protein supps.  Feeding tube placement as above put on hold till family meeting. 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 excess volume on exam Anemia  - Hgb trending down, last 8.5. Aranesp to10023mIV q Thursday. CBC ordered in AM Nutrition: Albumin 2.6 decreased p.o. intake, feeding tube see #2 #2 Recent CVA with no evidence of meaningful recovery  CouTobie PoetP CarMorrison14/2022,1:16 PM  LOS: 19 days

## 2021-06-08 NOTE — Progress Notes (Signed)
Patient ID: Amy [name], female   DOB: 01-22-1946, 75 y.o.   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  On presentation, hemoglobin was 9.6; GI was consulted.  She underwent EGD which was normal and flexible sigmoidoscopy which showed nonbleeding distal rectal ulcers/stercoral ulcers.  Subsequently, GI signed off. Given the hospitalizing, neurology was also consulted for altered mental status; MRI brain was similar to prior MRI at [facility] with a large right MCA stroke.  Nephrology following for dialysis needs; nephrology is stating that patient will not be accepted back to outpatient dialysis at this time due to her underlying confusion, weakness and unsafe to sit in a dialysis recliner as an outpatient.  Due to her poor oral intake, IR was consulted for possible PEG tube placement but IR was unable to place because of overlying transverse colon.  General surgery was consulted for G-tube placement but family currently undecided.  Palliative care following for goals of care discussion.  Assessment & Plan:   GI bleed secondary to rectal ulcers Anemia of chronic disease: From renal failure -Patient underwent EGD which was normal and flex sigmoidoscopy with clean base, nonbleeding distal rectal ulcers (stercoral ulcers).  Gastroenterology now signed off on 7/28. -Hemoglobin stable currently.  No further reports of bleeding during this hospitalization.   -Okay to  restart Eliquis per GI -Eliquis has been restarted.  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 -MRI brain findings were similar in appearance to her prior MRI at [facility] with no drastic changes which is notable for a large right MCA stroke with advanced microvascular ischemic changes of the white matter; MRA head degraded but did not show occlusion and bilateral carotid ultrasound revealed R ICA and LICA with [id number] stenosis.  EEG with cortical dysfunction right hemisphere likely secondary to underlying structural abnormality/stroke and moderate encephalopathy with no seizure activity or epileptiform discharges.  Hemoglobin A1c 5.1, LDL 68.  No further recommendations per neurology and signed off 05/28/2021. -Continue atorvastatin 40 mg p.o. daily -Diet as per SLP recommendations  Hyperlipidemia -Continue statin  Type 2 diabetes mellitus with episodes of hypoglycemia -Hemoglobin A1c 5.1.  Diet controlled at baseline. -Patient has had episodes of hypoglycemia due to poor oral intake.  She was started on D5 drip on 06/05/2021.  Monitor CBGs.   ESRD on HD -Currently per nephrology, not a candidate for continued outpatient HD given her confusion, weakness and inability to sit up in a chair for HD outpatient.  Overall HD is not enhancing her quality of life at this time. --Nephrology following, appreciate assistance continues on HD while inpatient --Per nephrology, Dr. Moshe Cipro will not accept back to outpatient dialysis at this time due to her underlying confusion, weakness and unsafe to sit in a outpatient dialysis recliner as she has been pulling at the HD needles prior -Nephrology is recommending consideration of transition to hospice.   -As per palliative care discussion with family on 06/05/2021: Family wants to continue to treat the treatable with  hopes for improvement and to continue hemodialysis as long as  patient can tolerate. -Patient will need either LTAC placement or another outpatient hemodialysis unit needs to be found.   Chronic thrombus left upper extremity -- Eliquis plan as above.   Hypotension: -Blood pressure currently is stable.  Continue midodrine  Stage II mid coccyx pressure injury, present on admission -Continue wound care  Acute metabolic encephalopathy/renal failure to thrive/generalized deconditioning/very poor oral intake Goals of care -Neurology has already signed off.  Overall prognosis is very poor. - Due to her poor oral intake, IR was consulted for PEG placement; unfortunately unable to place on 8/4 due to overlying transverse colon.  General surgery was consulted on 8/5 for surgical G-tube placement but currently on hold as family has not decided yet. -Palliative care following. -If condition does not improve, recommend total comfort measures. -Oral intake is still very poor.  I have requested dietitian consult for calorie count starting on 06/06/2021 onwards.  If still remains poor, will have to proceed with G-tube placement by general surgery if family still wants it.  Urinary tract infection -Completed 3-day course of ceftriaxone  Hepatic hypodensity -Incidental finding of an ovoid hepatic hypodensity anterior aspect of liver measuring up to 4.9 cm, could represent mass versus focal fatty infiltration.  Will need outpatient follow-up with MRI liver for further evaluation if goals of care remain aggressive.  Leukocytosis -Resolved  GERD -Continue PPI  DVT prophylaxis: Heparin subcutaneous Code Status: DNR Family Communication: None at bedside Disposition Plan: Status is: Inpatient  Remains inpatient appropriate because:Inpatient level of care appropriate due to severity of illness  Dispo: The patient is from: SNF              Anticipated d/c is to: SNF.  Might have to transfer her to [facility]              Patient currently is not medically stable to  d/c.   Difficult to place patient No  Consultants: Neurology/palliative care/general surgery/IR/GI  Procedures:  EEG 8/1: Suggestive of cortical dysfunction right hemisphere likely secondary to underlying structural abnormality/stroke, moderate encephalopathy, no seizure activity or epileptiform discharges. EGD 7/27, normal exam Flex sigmoidoscopy 7/27: 4 nonbleeding ulcers  Antimicrobials:  Anti-infectives (From admission, onward)    Start     Dose/Rate Route Frequency Ordered Stop   05/29/21 1622  ceFAZolin (ANCEF) 2-4 GM/100ML-% IVPB       Note to Pharmacy: Lytle Butte   : cabinet override      05/29/21 1622 05/30/21 0429   05/29/21 0000  cefTRIAXone (ROCEPHIN) 1 g in sodium chloride 0.9 % 100 mL IVPB  Status:  Discontinued        1 g 200 mL/hr over 30 Minutes Intravenous Every 24 hours 05/28/21 1801 05/28/21 1801   05/28/21 1830  cefTRIAXone (ROCEPHIN) 1 g in sodium chloride 0.9 % 100 mL IVPB        1 g 200 mL/hr over 30 Minutes Intravenous Every 24 hours 05/28/21 1801 05/30/21 1907   05/28/21 1630  ceFAZolin (ANCEF) IVPB 2g/100 mL premix        2 g 200 mL/hr over 30 Minutes Intravenous To Radiology 05/28/21 1537 05/29/21 1630        Subjective: Patient seen and examined at bedside.  Still confused; extremely poor historian.  No overnight fever, seizures, vomiting reported.  Oral intake is still very poor as per nursing staff.   Objective: Vitals:   06/07/21 1030 06/07/21 1130 06/07/21 1200 06/08/21 0538  BP: 95/60 (!) 88/57 108/64 (!) 114/51  Pulse: 96 (!) 102 97 93  Resp:   18 18  Temp:   (!) 97.1 F (36.2 C) 97.8 F (36.6 C)  TempSrc:   Axillary Axillary  SpO2: 97% 98% 97% 100%  Weight:    57 kg  Height:        Intake/Output Summary (Last 24 hours) at 06/08/2021 0821 Last data filed at 06/07/2021 1600 Gross per 24 hour  Intake 494.77 ml  Output 500 ml  Net -5.23 ml    Filed Weights   06/03/21 1301 06/07/21 0433 06/08/21 0538  Weight: 59.5 kg 56 kg 57  kg    Examination:  General exam: Looks chronically ill and deconditioned.  Currently on room air.  No acute distress.   Respiratory system: Bilateral decreased breath sounds at bases with scattered crackles  cardiovascular system: S1-S2 heard; currently rate controlled gastrointestinal system: Abdomen is slightly distended, soft and nontender.  Normal bowel sounds heard extremities: No clubbing; lower extremity edema present bilaterally  Central nervous system: Sleepy, wakes up slightly, confused, extremely slow to respond.  No focal neurological deficits.  Left-sided weakness present.   Skin: No obvious ecchymosis/rashes  psychiatry: Cannot assess because of mental status  Data Reviewed: I have personally reviewed following labs and imaging studies  CBC: Recent Labs  Lab 06/02/21 0505 06/04/21 0331 06/06/21 0337  WBC 10.5 8.2 5.8  NEUTROABS  --   --  3.7  HGB 8.2* 8.1* 8.5*  HCT 25.7* 25.5* 26.5*  MCV 97.3 97.7 96.7  PLT 253 199 [id number]    Basic Metabolic Panel: Recent Labs  Lab 06/02/21 0505 06/03/21 0441 06/04/21 0331 06/06/21 0337  NA 134* 134* 134* 130*  K 3.1* 3.5 3.7 3.7  CL 97* 97* 99 93*  CO2 '26 26 25 26  '$ GLUCOSE 219* 156* 83 85  BUN '12 12 12 18  '$ CREATININE 4.10* 4.89* 4.15* 5.31*  CALCIUM 7.8* 8.0* 7.8* 8.0*  MG 1.8 1.9  --   --     GFR: Estimated Creatinine Clearance: 8.5 mL/min (A) (by C-G formula based on SCr of 5.31 mg/dL (H)). Liver Function Tests: No results for input(s): AST, ALT, ALKPHOS, BILITOT, PROT, ALBUMIN in the last 168 hours.  No results for input(s): LIPASE, AMYLASE in the last 168 hours. No results for input(s): AMMONIA in the last 168 hours. Coagulation Profile: No results for input(s): INR, PROTIME in the last 168 hours. Cardiac Enzymes: No results for input(s): CKTOTAL, CKMB, CKMBINDEX, TROPONINI in the last 168 hours. BNP (last 3 results) No results for input(s): PROBNP in the last 8760 hours. HbA1C: No results for input(s):  HGBA1C in the last 72 hours. CBG: Recent Labs  Lab 06/07/21 0636 06/07/21 0659 06/07/21 1232 06/07/21 1623 06/08/21 0636  GLUCAP 66* 139* 107* 108* 105*    Lipid Profile: No results for input(s): CHOL, HDL, LDLCALC, TRIG, CHOLHDL, LDLDIRECT in the last 72 hours. Thyroid Function Tests: No results for input(s): TSH, T4TOTAL, FREET4, T3FREE, THYROIDAB in the last 72 hours. Anemia Panel: No results for input(s): VITAMINB12, FOLATE, FERRITIN, TIBC, IRON, RETICCTPCT in the last 72 hours. Sepsis Labs: No results for input(s): PROCALCITON, LATICACIDVEN in the last 168 hours.  No results found for this or any previous visit (from the past 240 hour(s)).       Radiology Studies: No results found.      Scheduled Meds:   stroke: mapping our early stages of recovery book   Does not apply Once   (  feeding supplement) PROSource Plus  30 mL Oral BID BM   apixaban  2.5 mg Oral BID   atorvastatin  40 mg Oral Daily   collagenase   Topical Daily   darbepoetin (ARANESP) injection - DIALYSIS  100 mcg Intravenous Q Sat-HD   feeding supplement (NEPRO CARB STEADY)  237 mL Oral TID BM   gabapentin  100 mg Oral QHS   midodrine  10 mg Oral TID WC   mirtazapine  7.5 mg Oral QHS   multivitamin  1 tablet Oral QHS   pantoprazole  40 mg Oral Q0600   sodium chloride flush  3 mL Intravenous Q12H   thiamine injection  100 mg Intravenous Daily   venlafaxine XR  75 mg Oral Q breakfast   Continuous Infusions:  sodium chloride     dextrose 50 mL/hr at 06/07/21 0442          Aline August, MD [facility] 06/08/2021, 8:21 AM

## 2021-06-09 DIAGNOSIS — N186 End stage renal disease: Secondary | ICD-10-CM | POA: Diagnosis not present

## 2021-06-09 DIAGNOSIS — I10 Essential (primary) hypertension: Secondary | ICD-10-CM | POA: Diagnosis not present

## 2021-06-09 DIAGNOSIS — K922 Gastrointestinal hemorrhage, unspecified: Secondary | ICD-10-CM | POA: Diagnosis not present

## 2021-06-09 DIAGNOSIS — I6330 Cerebral infarction due to thrombosis of unspecified cerebral artery: Secondary | ICD-10-CM | POA: Diagnosis not present

## 2021-06-09 LAB — CBC WITH DIFFERENTIAL/PLATELET
Abs Immature Granulocytes: 0.08 10*3/uL — ABNORMAL HIGH (ref 0.00–0.07)
Basophils Absolute: 0 10*3/uL (ref 0.0–0.1)
Basophils Relative: 0 %
Eosinophils Absolute: 0.4 10*3/uL (ref 0.0–0.5)
Eosinophils Relative: 4 %
HCT: 26.5 % — ABNORMAL LOW (ref 36.0–46.0)
Hemoglobin: 8.6 g/dL — ABNORMAL LOW (ref 12.0–15.0)
Immature Granulocytes: 1 %
Lymphocytes Relative: 14 %
Lymphs Abs: 1.2 10*3/uL (ref 0.7–4.0)
MCH: 30.5 pg (ref 26.0–34.0)
MCHC: 32.5 g/dL (ref 30.0–36.0)
MCV: 94 fL (ref 80.0–100.0)
Monocytes Absolute: 1 10*3/uL (ref 0.1–1.0)
Monocytes Relative: 12 %
Neutro Abs: 6 10*3/uL (ref 1.7–7.7)
Neutrophils Relative %: 69 %
Platelets: 210 10*3/uL (ref 150–400)
RBC: 2.82 MIL/uL — ABNORMAL LOW (ref 3.87–5.11)
RDW: 17.5 % — ABNORMAL HIGH (ref 11.5–15.5)
WBC: 8.6 10*3/uL (ref 4.0–10.5)
nRBC: 0 % (ref 0.0–0.2)

## 2021-06-09 LAB — GLUCOSE, CAPILLARY
Glucose-Capillary: 111 mg/dL — ABNORMAL HIGH (ref 70–99)
Glucose-Capillary: 80 mg/dL (ref 70–99)
Glucose-Capillary: 88 mg/dL (ref 70–99)
Glucose-Capillary: 101 mg/dL — ABNORMAL HIGH (ref 70–99)
Glucose-Capillary: 96 mg/dL (ref 70–99)

## 2021-06-09 LAB — BASIC METABOLIC PANEL
Anion gap: 9 (ref 5–15)
BUN: 12 mg/dL (ref 8–23)
CO2: 28 mmol/L (ref 22–32)
Calcium: 7.7 mg/dL — ABNORMAL LOW (ref 8.9–10.3)
Chloride: 88 mmol/L — ABNORMAL LOW (ref 98–111)
Creatinine, Ser: 4.76 mg/dL — ABNORMAL HIGH (ref 0.44–1.00)
GFR, Estimated: 9 mL/min — ABNORMAL LOW (ref >60)
Glucose, Bld: 88 mg/dL (ref 70–99)
Potassium: 3.6 mmol/L (ref 3.5–5.1)
Sodium: 125 mmol/L — ABNORMAL LOW (ref 135–145)

## 2021-06-09 NOTE — Progress Notes (Signed)
[facility] Progress Note   Subjective:    Seen in room. Lying in bed, minimally interactive. Not following commands. Has orders for dialysis today.   Objective Vitals:   06/08/21 1652 06/08/21 2045 06/09/21 0611 06/09/21 1027  BP: (!) 123/50 117/64 107/66 (!) 101/55  Pulse: 90 96 96 92  Resp: _0 Temp: (!) 97.3 F (36.3 C) (!) 97 F (36.1 C) (!) 97.4 F (36.3 C) (!) 97.5 F (36.4 C)  TempSrc: Oral Axillary Oral Oral  SpO2: 100% 100% 94% 95%  Weight:   56.9 kg   Height:       Physical Exam General:Chronically ill-appearing female; NAD Heart: S1/S2; No murmurs, gallops, or rubs Lungs: Clear throughout; No wheezing, rales, or friction rub Abdomen: Soft, non-tender  Extremities: Trace edema R hip. No edema BLLE Dialysis Access:  L AVG (+) Bruit/Thrill   Filed Weights   06/07/21 0433 06/08/21 0538 06/09/21 0611  Weight: 56 kg 57 kg 56.9 kg    Intake/Output Summary (Last 24 hours) at 06/09/2021 1056 Last data filed at 06/09/2021 0751 Gross per 24 hour  Intake 1946.84 ml  Output 0 ml  Net 1946.84 ml     Additional Objective Labs: Basic Metabolic Panel: Recent Labs  Lab 06/04/21 0331 06/06/21 0337 06/09/21 0828  NA 134* 130* 125*  K 3.7 3.7 3.6  CL 99 93* 88*  CO2 _1 GLUCOSE 83 85 88  BUN _2 CREATININE 4.15* 5.31* 4.76*  CALCIUM 7.8* 8.0* 7.7*    Liver Function Tests: No results for input(s): AST, ALT, ALKPHOS, BILITOT, PROT, ALBUMIN in the last 168 hours. No results for input(s): LIPASE, AMYLASE in the last 168 hours. CBC: Recent Labs  Lab 06/04/21 0331 06/06/21 0337 06/09/21 0828  WBC 8.2 5.8 8.6  NEUTROABS  --  3.7 6.0  HGB 8.1* 8.5* 8.6*  HCT 25.5* 26.5* 26.5*  MCV 97.7 96.7 94.0  PLT 199 240 210    Blood Culture    Component Value Date/Time   SDES URINE, CATHETERIZED 05/02/2021 0022   SPECREQUEST NONE 05/02/2021 0022   CULT  05/02/2021 0022    NO GROWTH Performed at [facility], [place] [address] [place], [place] [address]    REPTSTATUS 05/04/2021 FINAL 05/02/2021 0022    Cardiac Enzymes: No results for input(s): CKTOTAL, CKMB, CKMBINDEX, TROPONINI in the last 168 hours. CBG: Recent Labs  Lab 06/08/21 1145 06/08/21 1654 06/08/21 2227 06/09/21 0502 06/09/21 0707  GLUCAP 115* 100* 101* 111* 80    Iron Studies: No results for input(s): IRON, TIBC, TRANSFERRIN, FERRITIN in the last 72 hours. Lab Results  Component Value Date   INR 0.9 03/10/2021   INR 1.0 01/26/2021   Studies/Results: No results found.  Medications:  sodium chloride     dextrose 50 mL/hr at 06/08/21 2128     stroke: mapping our early stages of recovery book   Does not apply Once   (feeding supplement) PROSource Plus  30 mL Oral BID BM   apixaban  2.5 mg Oral BID   atorvastatin  40 mg Oral Daily   collagenase   Topical Daily   darbepoetin (ARANESP) injection - DIALYSIS  100 mcg Intravenous Q Sat-HD   feeding supplement (NEPRO CARB STEADY)  237 mL Oral TID BM   gabapentin  100 mg Oral QHS   midodrine  10 mg Oral TID WC   mirtazapine  7.5 mg Oral QHS   multivitamin  1  tablet Oral QHS   pantoprazole  40 mg Oral Q0600   sodium chloride flush  3 mL Intravenous Q12H   thiamine injection  100 mg Intravenous Daily   venlafaxine XR  75 mg Oral Q breakfast    Dialysis Orders: AF TTS here 3h 76mn 400/500    62.5kg    3K/2.5 Ca    P2  AVG HeRO   Hep none - Mircera 50 q 2 wks  (7/13)  Venofer 50 q wk - Parsabiv 540mIV q HDlower extremity edema, left upper arm swelling, nontender dialysis Access: Left AVG positive bruit, with upper arm swelling  Assessment/Plan: Melena/Rectal bleeding - GI consulted. Hx rectal ulcers.  EGD 7/27 normal / flex sig =mucosal ulceration c/w sterocoral ulcers, non bleeding.  Hgb stable 8.6 AMS: Providers and palliative care met w/ family 8/08 and discussed pt's decline. Due to declining mental and physical status we don't anticipate she will be able to do OP dialysis.   Pt will have to be able to sit in a chair for 4-6 hrs to qualify for OP HD w/ our group. If she is able to sit for dialysis and cooperate, then the next step would to re-CLIP the patient as her OP HD unit will not take her back. Plan to attempt recliner for  HD 06/09/21. Unclear that she will be able to sit for dialysis.  ESRD: HD TTS here (usual MWF outpatient). Due to staffing issues this week, we are doing HD just 2 times when possible. HD Tues/ Sat here this week-Tolerated yesterday's HD with net UF 500cc. Plan for HD 06/09/21 per her usual schedule. 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 Contracture distal to AVF Hypertension/volume  - BP soft. On midodrine for BP support.  Edema improving, UF as tolerated.  Under edw if weights correct. Nutrition: Albumin low. Continue protein supps.   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  Anemia  - Aranesp 10080mIV q Thursday.  Dispo: DNR/DNI. Palliative care following to establish goals of care. Appreciate their involvement. Per last note, family wishes to continue dialysis for now. At this point does not appear suitable for outpatient dialysis.    OgeLynnda Child-C [facility] 06/09/2021,10:57 AM

## 2021-06-09 NOTE — Plan of Care (Signed)
  Problem: Safety: Goal: Ability to remain free from injury will improve Outcome: Progressing   

## 2021-06-09 NOTE — Progress Notes (Signed)
Patient ID: Amy [name], female   DOB: September 13, 1946, 75 y.o.   MRN: [id number] Had a long conversation with the patient's son [name] and god daughter Joelene Millin who is a Marine scientist in [place].  I d/w them that she would be at a very high risk from a surgery standpoint to undergo surgical Gtube placement in light of the patient having no dysphagia.  Per family she had been eating some, but likely may not like hosp food and not having enough time spent with feeding efforts.  I recommended to them that she would likely be a candidate for Coretrack placement to help with supplementation.  The family brought up the fact that this is not a long term option and would only needed to supplement her calories.  They understood that she would be a high surgical risk and would OK with placing Nasogastric feeding tube in trying to avoid surgery.  Please call back if needed.

## 2021-06-09 NOTE — Progress Notes (Addendum)
Patient ID: [name], female   DOB: 1946/06/25, 75 y.o.   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  On presentation, hemoglobin was 9.6; GI was consulted.  She underwent EGD which was normal and flexible sigmoidoscopy which showed nonbleeding distal rectal ulcers/stercoral ulcers.  Subsequently, GI signed off. Given the hospitalizing, neurology was also consulted for altered mental status; MRI brain was similar to prior MRI at [facility] with a large right MCA stroke.  Nephrology following for dialysis needs; nephrology is stating that patient will not be accepted back to outpatient dialysis at this time due to her underlying confusion, weakness and unsafe to sit in a dialysis recliner as an outpatient.  Due to her poor oral intake, IR was consulted for possible PEG tube placement but IR was unable to place because of overlying transverse colon.  General surgery was consulted for G-tube placement but family currently undecided.  Palliative care following for goals of care discussion.  Assessment & Plan:   GI bleed secondary to rectal ulcers Anemia of chronic disease: From renal failure -Patient underwent EGD which was normal and flex sigmoidoscopy with clean base, nonbleeding distal rectal ulcers (stercoral ulcers).  Gastroenterology now signed off on 7/28. -Hemoglobin stable currently.  No further reports of bleeding during this hospitalization.   -Okay to  restart Eliquis per GI -Eliquis has been restarted.  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 -MRI brain findings were similar in appearance to her prior MRI at [facility] with no drastic changes which is notable for a large right MCA stroke with advanced microvascular ischemic changes of the white matter; MRA head degraded but did not show occlusion and bilateral carotid ultrasound revealed R ICA and LICA with [id number] stenosis.  EEG with cortical dysfunction right hemisphere likely secondary to underlying structural abnormality/stroke and moderate encephalopathy with no seizure activity or epileptiform discharges.  Hemoglobin A1c 5.1, LDL 68.  No further recommendations per neurology and signed off 05/28/2021. -Continue atorvastatin 40 mg p.o. daily -Diet as per SLP recommendations  Hyperlipidemia -Continue statin  Type 2 diabetes mellitus with episodes of hypoglycemia -Hemoglobin A1c 5.1.  Diet controlled at baseline. -Patient has had episodes of hypoglycemia due to poor oral intake.  She was started on D5 drip on 06/05/2021.  Monitor CBGs.   ESRD on HD -Currently per nephrology, not a candidate for continued outpatient HD given her confusion, weakness and inability to sit up in a chair for HD outpatient.  Overall HD is not enhancing her quality of life at this time. --Nephrology following, appreciate assistance continues on HD while inpatient --Per nephrology, Dr. Moshe Cipro will not accept back to outpatient dialysis at this time due to her underlying confusion, weakness and unsafe to sit in a outpatient dialysis recliner as she has been pulling at the HD needles prior -Nephrology is recommending consideration of transition to hospice.   -As per palliative care discussion with family on 06/05/2021: Family wants to continue to treat the treatable with  hopes for improvement and to continue hemodialysis as long as  patient can tolerate. -Patient will need either LTAC placement or another outpatient hemodialysis unit needs to be found.   Chronic thrombus left upper extremity -- Eliquis plan as above.   Hypotension: -Blood pressure currently is stable.  Continue midodrine  Stage II mid coccyx pressure injury, present on admission -Continue wound care  Acute metabolic encephalopathy/renal failure to thrive/generalized deconditioning/very poor oral intake Goals of care -Neurology has already signed off.  Overall prognosis is very poor. - Due to her poor oral intake, IR was consulted for PEG placement; unfortunately unable to place on 8/4 due to overlying transverse colon.  General surgery was consulted on 8/5 for surgical G-tube placement but currently on hold as family has not decided yet. -Palliative care following. -If condition does not improve, recommend total comfort measures. -Oral intake is still very poor.  I had requested dietitian consult for calorie count starting on 06/06/2021 onwards.  Oral intake still remains poor.  Spoke to son [name] and [name] on phone who are agreeable to proceed with feeding tube placement; will reconsult general surgery.  DC Eliquis.  Urinary tract infection -Completed 3-day course of ceftriaxone  Hepatic hypodensity -Incidental finding of an ovoid hepatic hypodensity anterior aspect of liver measuring up to 4.9 cm, could represent mass versus focal fatty infiltration.  Will need outpatient follow-up with MRI liver for further evaluation if goals of care remain aggressive.  Leukocytosis -Resolved  GERD -Continue PPI  DVT prophylaxis: DC Eliquis. Code Status: DNR Family Communication: Spoke to [employer] and Kim on phone on 06/09/21 Disposition Plan: Status is: Inpatient  Remains inpatient appropriate because:Inpatient level of care appropriate due to severity of illness  Dispo: The patient is from: SNF              Anticipated d/c is to: SNF.  M[facility] have to  [facility]              Patient currently is not medically stable to d/c.   Difficult to place patient No  Consultants: Neurology/palliative care/general surgery/IR/GI  Procedures:  EEG 8/1: Suggestive of cortical dysfunction right hemisphere likely secondary to underlying structural abnormality/stroke, moderate encephalopathy, no seizure activity or epileptiform discharges. EGD 7/27, normal exam Flex sigmoidoscopy 7/27: 4 nonbleeding ulcers  Antimicrobials:  Anti-infectives (From admission, onward)    Start     Dose/Rate Route Frequency Ordered Stop   05/29/21 1622  ceFAZolin (ANCEF) 2-4 GM/100ML-% IVPB       Note to Pharmacy: Lytle Butte   : cabinet override      05/29/21 1622 05/30/21 0429   05/29/21 0000  cefTRIAXone (ROCEPHIN) 1 g in sodium chloride 0.9 % 100 mL IVPB  Status:  Discontinued        1 g 200 mL/hr over 30 Minutes Intravenous Every 24 hours 05/28/21 1801 05/28/21 1801   05/28/21 1830  cefTRIAXone (ROCEPHIN) 1 g in sodium chloride 0.9 % 100 mL IVPB        1 g 200 mL/hr over 30 Minutes Intravenous Every 24 hours 05/28/21 1801 05/30/21 1907   05/28/21 1630  ceFAZolin (ANCEF) IVPB 2g/100 mL premix        2 g 200 mL/hr over 30 Minutes Intravenous To Radiology 05/28/21 1537 05/29/21 1630        Subjective: Patient seen and examined at bedside.  Still confused; extremely poor historian.  Oral intake is still poor.  No seizures, vomiting or fever reported by nursing  staff. Objective: Vitals:   06/08/21 0839 06/08/21 1652 06/08/21 2045 06/09/21 0611  BP: 107/67 (!) 123/50 117/64 107/66  Pulse: 88 90 96 96  Resp: '19 19 16 16  '$ Temp: 97.8 F (36.6 C) (!) 97.3 F (36.3 C) (!) 97 F (36.1 C) (!) 97.4 F (36.3 C)  TempSrc:  Oral Axillary Oral  SpO2: 98% 100% 100% 94%  Weight:    56.9 kg  Height:        Intake/Output Summary (Last 24 hours) at 06/09/2021 0815 Last data filed at 06/09/2021 0600 Gross per 24 hour  Intake 1946.84 ml  Output 0 ml  Net  1946.84 ml    Filed Weights   06/07/21 0433 06/08/21 0538 06/09/21 0611  Weight: 56 kg 57 kg 56.9 kg    Examination:  General exam: Looks chronically ill and deconditioned.  Intermittently on 2 L oxygen by nasal cannula.  No distress.   Respiratory system: Decreased breath sounds at bases bilaterally with some crackles  cardiovascular system: Rate controlled, S1-S2 heard  gastrointestinal system: Abdomen is distended slightly, soft and nontender.  Bowel sounds are heard extremities: Lower extremity edema present bilaterally; no cyanosis Central nervous system: Still very confused on waking up; extremely slow to respond.  No focal neurological deficits.  Left-sided weakness present.   Skin: No obvious petechiae/lesions  psychiatry: Could not be assessed because of mental status   Data Reviewed: I have personally reviewed following labs and imaging studies  CBC: Recent Labs  Lab 06/04/21 0331 06/06/21 0337  WBC 8.2 5.8  NEUTROABS  --  3.7  HGB 8.1* 8.5*  HCT 25.5* 26.5*  MCV 97.7 96.7  PLT 199 [id number]    Basic Metabolic Panel: Recent Labs  Lab 06/03/21 0441 06/04/21 0331 06/06/21 0337  NA 134* 134* 130*  K 3.5 3.7 3.7  CL 97* 99 93*  CO2 '26 25 26  '$ GLUCOSE 156* 83 85  BUN '12 12 18  '$ CREATININE 4.89* 4.15* 5.31*  CALCIUM 8.0* 7.8* 8.0*  MG 1.9  --   --     GFR: Estimated Creatinine Clearance: 8.5 mL/min (A) (by C-G formula based on SCr of 5.31 mg/dL (H)). Liver Function Tests: No results for input(s): AST, ALT, ALKPHOS, BILITOT, PROT, ALBUMIN in the last 168 hours.  No results for input(s): LIPASE, AMYLASE in the last 168 hours. No results for input(s): AMMONIA in the last 168 hours. Coagulation Profile: No results for input(s): INR, PROTIME in the last 168 hours. Cardiac Enzymes: No results for input(s): CKTOTAL, CKMB, CKMBINDEX, TROPONINI in the last 168 hours. BNP (last 3 results) No results for input(s): PROBNP in the last 8760 hours. HbA1C: No results for  input(s): HGBA1C in the last 72 hours. CBG: Recent Labs  Lab 06/08/21 1145 06/08/21 1654 06/08/21 2227 06/09/21 0502 06/09/21 0707  GLUCAP 115* 100* 101* 111* 80    Lipid Profile: No results for input(s): CHOL, HDL, LDLCALC, TRIG, CHOLHDL, LDLDIRECT in the last 72 hours. Thyroid Function Tests: No results for input(s): TSH, T4TOTAL, FREET4, T3FREE, THYROIDAB in the last 72 hours. Anemia Panel: No results for input(s): VITAMINB12, FOLATE, FERRITIN, TIBC, IRON, RETICCTPCT in the last 72 hours. Sepsis Labs: No results for input(s): PROCALCITON, LATICACIDVEN in the last 168 hours.  No results found for this or any previous visit (from the past 240 hour(s)).       Radiology Studies: No results found.      Scheduled Meds:   stroke: mapping our early stages of recovery book  Does not apply Once   (feeding supplement) PROSource Plus  30 mL Oral BID BM   apixaban  2.5 mg Oral BID   atorvastatin  40 mg Oral Daily   collagenase   Topical Daily   darbepoetin (ARANESP) injection - DIALYSIS  100 mcg Intravenous Q Sat-HD   feeding supplement (NEPRO CARB STEADY)  237 mL Oral TID BM   gabapentin  100 mg Oral QHS   midodrine  10 mg Oral TID WC   mirtazapine  7.5 mg Oral QHS   multivitamin  1 tablet Oral QHS   pantoprazole  40 mg Oral Q0600   sodium chloride flush  3 mL Intravenous Q12H   thiamine injection  100 mg Intravenous Daily   venlafaxine XR  75 mg Oral Q breakfast   Continuous Infusions:  sodium chloride     dextrose 50 mL/hr at 06/08/21 2128          Aline August, MD [facility] 06/09/2021, 8:15 AM

## 2021-06-09 NOTE — Progress Notes (Signed)
Calorie Count Note  48-72 hour calorie count ordered.  Diet: dysphagia 3 with thin liquids Supplements: nepro TID, magic cup TID, prosource plus BID  RD went to follow-up on calorie count results from the weekend and found that pt consumed 0% of all meals/snacks.   Surgery evaluated pt again today and determined pt is not a candidate for G-tube. Given pt is not a candidate for PEG/G-tube and continues to have poor PO intake, recommend PMT discuss GOC and transition to comfort care as nutrition support via Cortrak is not a long-term solution.  Nutrition Dx: Severe Malnutrition related to chronic illness (ESRD on HD, prior CVA) as evidenced by percent weight loss, severe muscle depletion, energy intake < or equal to 75% for > or equal to 1 month. -- ongoing  Goal: Patient will meet greater than or equal to 90% of their needs -- not met  Intervention:  -d/c calorie count -Continue Nepro shakes po TID -Continue Magic Cup TID -Continue Rena-vite -Continue feeding assistance with meals -Continue Prosource Plus po BID   Larkin Ina, MS, RD, LDN (she/her/hers) RD pager number and weekend/on-call pager number located in [place].

## 2021-06-09 NOTE — Progress Notes (Signed)
Patient spitting out food when being fed.

## 2021-06-09 NOTE — Progress Notes (Signed)
19 Days Post-Op  Subjective: CC: Called back to review case to determine if patient is candidate for g-tube placement. Patient is confused with no complaints.   Objective: Vital signs in last 24 hours: Temp:  [97 F (36.1 C)-97.5 F (36.4 C)] 97.5 F (36.4 C) (08/15 1027) Pulse Rate:  [90-96] 92 (08/15 1027) Resp:  [16-19] 16 (08/15 1027) BP: (101-123)/(50-66) 101/55 (08/15 1027) SpO2:  [94 %-100 %] 95 % (08/15 1027) Weight:  [56.9 kg] 56.9 kg (08/15 0611) Last BM Date: 06/06/21  Intake/Output from previous day: 08/14 0701 - 08/15 0700 In: 1946.8 [P.O.:60; I.V.:1888.8] Out: 0  Intake/Output this shift: No intake/output data recorded.  PE: Gen:  Confused, NAD, pleasant Card:  RRR Pulm:  CTAB, no W/R/R, effort normal Abd: Soft, ND, NT +BS, prior laparoscopic scars noted and well healed.  Skin: no rashes noted, warm and dry  Lab Results:  Recent Labs    06/09/21 0828  WBC 8.6  HGB 8.6*  HCT 26.5*  PLT 210   BMET Recent Labs    06/09/21 0828  NA 125*  K 3.6  CL 88*  CO2 28  GLUCOSE 88  BUN 12  CREATININE 4.76*  CALCIUM 7.7*   PT/INR No results for input(s): LABPROT, INR in the last 72 hours. CMP     Component Value Date/Time   NA 125 (L) 06/09/2021 0828   K 3.6 06/09/2021 0828   K 4.3 08/16/2012 1201   CL 88 (L) 06/09/2021 0828   CO2 28 06/09/2021 0828   GLUCOSE 88 06/09/2021 0828   BUN 12 06/09/2021 0828   CREATININE 4.76 (H) 06/09/2021 0828   CALCIUM 7.7 (L) 06/09/2021 0828   PROT 6.4 (L) 05/27/2021 0531   ALBUMIN 2.6 (L) 05/31/2021 1858   AST 34 05/27/2021 0531   ALT 26 05/27/2021 0531   ALKPHOS 78 05/27/2021 0531   BILITOT 0.8 05/27/2021 0531   GFRNONAA 9 (L) 06/09/2021 0828   Lipase     Component Value Date/Time   LIPASE 31 01/18/2021 1548    Studies/Results: No results found.  Anti-infectives: Anti-infectives (From admission, onward)    Start     Dose/Rate Route Frequency Ordered Stop   05/29/21 1622  ceFAZolin (ANCEF)  2-4 GM/100ML-% IVPB       Note to Pharmacy: Lytle Butte   : cabinet override      05/29/21 1622 05/30/21 0429   05/29/21 0000  cefTRIAXone (ROCEPHIN) 1 g in sodium chloride 0.9 % 100 mL IVPB  Status:  Discontinued        1 g 200 mL/hr over 30 Minutes Intravenous Every 24 hours 05/28/21 1801 05/28/21 1801   05/28/21 1830  cefTRIAXone (ROCEPHIN) 1 g in sodium chloride 0.9 % 100 mL IVPB        1 g 200 mL/hr over 30 Minutes Intravenous Every 24 hours 05/28/21 1801 05/30/21 1907   05/28/21 1630  ceFAZolin (ANCEF) IVPB 2g/100 mL premix        2 g 200 mL/hr over 30 Minutes Intravenous To Radiology 05/28/21 1537 05/29/21 1630        Assessment/Plan Poor PO intake - Called back to review case to determine if patient is candidate for g-tube placement. IR perc G-tube placement 8/4 unable to be placed 2/2 no safe window of stomach due to overlying transverse colon. Our team was consulted 8/5. At that time Nephrology felt she would not be a candidate for outpatient HD and Palliative was consulted to discuss Diamond. Family and  palliative meetings ongoing.  TRH reports they have spoken with family and they want to proceed with G-tube placement. Last palliative note on 8/11 reports family wishes "Continue hemodialysis as long as patient can tolerate [...] Continue to treat the treatable and hope for improvement". SLP note 8/10 "Pt's p.o. intake continues to be limited due to pt's refusal and is not due to any significant dysphagia." Calorie count on 8/12 w/ 0% intake over the course of 8 meals. Last dose eliquis 8/14 at 2129. Per last HD note "pt will have to be able to sit in a chair for 4-6 hrs to qualify for OP HD w/ our group. If she is able to sit for dialysis and cooperate, then the next step would to re-CLIP the patient". They are going to attempt recliner for HD today to see if this helps.  - Noted patient is still not a good candidate for outpatient HD. Nephrology is attempting recliner for HD to see if  tolerates as noted above. Would await this before discussing role of surgery as if patient cannot continue HD, placement of G-tube, which would require a surgery with risks (including but not limited to that outlined below) may be futile. In the interm, could consider placement of a cortrak and initiation of TF's for supplemental nutrition. When patient has a clear outlined plan from nephrology, disposition and family wishes from palliative (patient unable to participate in any discussion of plans/wishes) - can re-visit conversation about surgery. Would continue to allow her to eat as per speech note she does not have any dysphagia, but appears her need for g-tube is rather from refusal to eat. I discussed this with my attending, Dr. Rosendo Gros, who agrees with the plan.      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

## 2021-06-10 DIAGNOSIS — I6330 Cerebral infarction due to thrombosis of unspecified cerebral artery: Secondary | ICD-10-CM | POA: Diagnosis not present

## 2021-06-10 DIAGNOSIS — N186 End stage renal disease: Secondary | ICD-10-CM | POA: Diagnosis not present

## 2021-06-10 DIAGNOSIS — K922 Gastrointestinal hemorrhage, unspecified: Secondary | ICD-10-CM | POA: Diagnosis not present

## 2021-06-10 DIAGNOSIS — Z992 Dependence on renal dialysis: Secondary | ICD-10-CM | POA: Diagnosis not present

## 2021-06-10 LAB — GLUCOSE, CAPILLARY
Glucose-Capillary: 82 mg/dL (ref 70–99)
Glucose-Capillary: 117 mg/dL — ABNORMAL HIGH (ref 70–99)
Glucose-Capillary: 134 mg/dL — ABNORMAL HIGH (ref 70–99)

## 2021-06-10 MED ORDER — PROSOURCE TF PO LIQD: 45.0000 mL | Freq: Two times a day (BID) | ORAL | Status: DC

## 2021-06-10 MED ORDER — PROSOURCE TF PO LIQD
45.0000 mL | Freq: Every day | ORAL | Status: DC
  Administered 2021-06-12 – 2021-07-17 (×32): 45 mL
  Filled 2021-06-10 (×39): qty 45

## 2021-06-10 MED ORDER — ALBUMIN HUMAN 25 % IV SOLN
INTRAVENOUS | Status: AC
  Administered 2021-06-10: 25 g via INTRAVENOUS
  Filled 2021-06-10: qty 100

## 2021-06-10 MED ORDER — ALBUMIN HUMAN 25 % IV SOLN: 25.0000 g | Freq: Once | INTRAVENOUS | Status: AC

## 2021-06-10 MED ORDER — OSMOLITE 1.5 CAL PO LIQD
1000.0000 mL | ORAL | Status: DC
  Administered 2021-06-11 – 2021-06-26 (×14): 1000 mL
  Filled 2021-06-10 (×20): qty 1000

## 2021-06-10 MED ORDER — OXYCODONE HCL 5 MG PO TABS
5.0000 mg | ORAL_TABLET | Freq: Four times a day (QID) | ORAL | Status: DC | PRN
  Administered 2021-06-10 – 2021-06-15 (×7): 5 mg via ORAL
  Filled 2021-06-10 (×7): qty 1

## 2021-06-10 MED ORDER — ACETAMINOPHEN 325 MG PO TABS
650.0000 mg | ORAL_TABLET | Freq: Four times a day (QID) | ORAL | Status: DC | PRN
  Administered 2021-06-13: 650 mg via ORAL
  Filled 2021-06-10: qty 2

## 2021-06-10 MED ORDER — HYDROMORPHONE HCL 1 MG/ML IJ SOLN
0.5000 mg | Freq: Four times a day (QID) | INTRAMUSCULAR | Status: DC | PRN
  Administered 2021-06-10 – 2021-06-22 (×7): 0.5 mg via INTRAVENOUS
  Filled 2021-06-10 (×7): qty 1

## 2021-06-10 NOTE — Progress Notes (Signed)
[facility] Progress Note   Subjective:    Seen in HD unit. Sitting in recliner. Keeps saying "ouch ouch ouch" but unable to localize pain.   Objective Vitals:   06/10/21 0848 06/10/21 0852 06/10/21 0900 06/10/21 0930  BP: 137/71 121/65 (!) 131/92 98/76  Pulse: (!) 108 76 (!) 103 74  Resp:      Temp: (!) 97.1 F (36.2 C)     TempSrc: Axillary     SpO2: 98%     Weight:      Height:       Physical Exam General:Chronically ill-appearing female; NAD Heart: S1/S2; No murmurs, gallops, or rubs Lungs: Clear throughout; No wheezing, rales, or friction rub Abdomen: Soft, non-tender  Extremities: Trace edema R hip. No edema BLLE Dialysis Access:  L AVG (+) Bruit/Thrill   Filed Weights   06/07/21 0433 06/08/21 0538 06/09/21 0611  Weight: 56 kg 57 kg 56.9 kg    Intake/Output Summary (Last 24 hours) at 06/10/2021 1001 Last data filed at 06/10/2021 0800 Gross per 24 hour  Intake 0 ml  Output --  Net 0 ml     Additional Objective Labs: Basic Metabolic Panel: Recent Labs  Lab 06/04/21 0331 06/06/21 0337 06/09/21 0828  NA 134* 130* 125*  K 3.7 3.7 3.6  CL 99 93* 88*  CO2 _0 GLUCOSE 83 85 88  BUN _1 CREATININE 4.15* 5.31* 4.76*  CALCIUM 7.8* 8.0* 7.7*    Liver Function Tests: No results for input(s): AST, ALT, ALKPHOS, BILITOT, PROT, ALBUMIN in the last 168 hours. No results for input(s): LIPASE, AMYLASE in the last 168 hours. CBC: Recent Labs  Lab 06/04/21 0331 06/06/21 0337 06/09/21 0828  WBC 8.2 5.8 8.6  NEUTROABS  --  3.7 6.0  HGB 8.1* 8.5* 8.6*  HCT 25.5* 26.5* 26.5*  MCV 97.7 96.7 94.0  PLT 199 240 210    Blood Culture    Component Value Date/Time   SDES URINE, CATHETERIZED 05/02/2021 0022   SPECREQUEST NONE 05/02/2021 0022   CULT  05/02/2021 0022    NO GROWTH Performed at [facility], [place] [address], [place], [place] [address]    REPTSTATUS 05/04/2021 FINAL 05/02/2021 0022    Cardiac Enzymes: No results  for input(s): CKTOTAL, CKMB, CKMBINDEX, TROPONINI in the last 168 hours. CBG: Recent Labs  Lab 06/09/21 0707 06/09/21 1230 06/09/21 1642 06/09/21 2057 06/10/21 0630  GLUCAP 80 88 101* 96 82    Iron Studies: No results for input(s): IRON, TIBC, TRANSFERRIN, FERRITIN in the last 72 hours. Lab Results  Component Value Date   INR 0.9 03/10/2021   INR 1.0 01/26/2021   Studies/Results: No results found.  Medications:  sodium chloride     albumin human     dextrose 50 mL/hr at 06/09/21 1607     stroke: mapping our early stages of recovery book   Does not apply Once   (feeding supplement) PROSource Plus  30 mL Oral BID BM   atorvastatin  40 mg Oral Daily   collagenase   Topical Daily   darbepoetin (ARANESP) injection - DIALYSIS  100 mcg Intravenous Q Sat-HD   feeding supplement (NEPRO CARB STEADY)  237 mL Oral TID BM   gabapentin  100 mg Oral QHS   midodrine  10 mg Oral TID WC   mirtazapine  7.5 mg Oral QHS   multivitamin  1 tablet Oral QHS   pantoprazole  40 mg Oral Q0600   sodium  chloride flush  3 mL Intravenous Q12H   thiamine injection  100 mg Intravenous Daily   venlafaxine XR  75 mg Oral Q breakfast    Dialysis Orders: AF TTS here 3h 61mn 400/500    62.5kg    3K/2.5 Ca    P2  AVG HeRO   Hep none - Mircera 50 q 2 wks  (7/13)  Venofer 50 q wk - Parsabiv 588mIV q HDlower extremity edema, left upper arm swelling, nontender dialysis Access: Left AVG positive bruit, with upper arm swelling  Assessment/Plan: Melena/Rectal bleeding - GI consulted. Hx rectal ulcers.  EGD 7/27 normal / flex sig =mucosal ulceration c/w sterocoral ulcers, non bleeding.  Hgb stable 8.6 AMS: Providers and palliative care met w/ family 8/08 and discussed pt's decline. Due to declining mental and physical status we don't anticipate she will be able to do OP dialysis.  Pt will have to be able to sit in a chair for 4-6 hrs to qualify for OP HD w/ our group. If she is able to sit for dialysis and  cooperate, then the next step would to re-CLIP the patient as her OP HD unit will not take her back. Unclear that she will be able to sit for dialysis as OP. HD in recliner today.  ESRD: HD TTS here (usual MWF outpatient). Off schedule last week. Continue TTS for now pending dispo.  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 Contracture distal to AVF Hypertension/volume  - BP soft. On midodrine for BP support.  Edema improving, UF as tolerated.  Under edw if weights correct. Nutrition: Albumin low. Continue protein supps.   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  Anemia  - Aranesp 1004mIV q Thursday.  Dispo: DNR/DNI. Palliative care following to establish goals of care. Appreciate their involvement. Per last note, family wishes to continue dialysis for now. At this point does not appear suitable for outpatient dialysis.    OgeLynnda Child-C [facility] 06/10/2021,10:01 AM

## 2021-06-10 NOTE — Progress Notes (Signed)
Patient ID: Amy [name], female   DOB: Dec 22, 1945, 75 y.o.   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  On presentation, hemoglobin was 9.6; GI was consulted.  She underwent EGD which was normal and flexible sigmoidoscopy which showed nonbleeding distal rectal ulcers/stercoral ulcers.  Subsequently, GI signed off. Given the hospitalizing, neurology was also consulted for altered mental status; MRI brain was similar to prior MRI at [facility] with a large right MCA stroke.  Nephrology following for dialysis needs; nephrology is stating that patient will not be accepted back to outpatient dialysis at this time due to her underlying confusion, weakness and unsafe to sit in a dialysis recliner as an outpatient.  Due to her poor oral intake, IR was consulted for possible PEG tube placement but IR was unable to place because of overlying transverse colon.  General surgery was consulted for G-tube placement but family currently undecided.  Palliative care following for goals of care discussion.  Assessment & Plan:   GI bleed secondary to rectal ulcers Anemia of chronic disease: From renal failure -Patient underwent EGD which was normal and flex sigmoidoscopy with clean base, nonbleeding distal rectal ulcers (stercoral ulcers).  Gastroenterology now signed off on 7/28. -Hemoglobin stable currently.  No further reports of bleeding during this hospitalization.   -Okay to  restart Eliquis per GI -Eliquis was held again on 06/09/2021.  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 -MRI brain findings were similar in appearance to her prior MRI at [facility] with no drastic changes which is notable for a large right MCA stroke with advanced microvascular ischemic changes of the white matter; MRA head degraded but did not show occlusion and bilateral carotid ultrasound revealed R ICA and LICA with [id number] stenosis.  EEG with cortical dysfunction right hemisphere likely secondary to underlying structural abnormality/stroke and moderate encephalopathy with no seizure activity or epileptiform discharges.  Hemoglobin A1c 5.1, LDL 68.  No further recommendations per neurology and signed off 05/28/2021. -Continue atorvastatin 40 mg p.o. daily -Diet as per SLP recommendations  Hyperlipidemia -Continue statin  Type 2 diabetes mellitus with episodes of hypoglycemia -Hemoglobin A1c 5.1.  Diet controlled at baseline. -Patient has had episodes of hypoglycemia due to poor oral intake.  She was started on D5 drip on 06/05/2021.  Monitor CBGs.   ESRD on HD -Currently per nephrology, not a candidate for continued outpatient HD given her confusion, weakness and inability to sit up in a chair for HD outpatient.  Overall HD is not enhancing her quality of life at this time. --Nephrology following, appreciate assistance continues on HD while inpatient --Per nephrology, Dr. Moshe Cipro will not accept back to outpatient dialysis at this time due to her underlying confusion, weakness and unsafe to sit in a outpatient dialysis recliner as she has been pulling at the HD needles prior -Nephrology is recommending consideration of transition to hospice.   -As per palliative care discussion with family on 06/05/2021: Family wants to continue to treat the  treatable with hopes for improvement and to continue hemodialysis  as long as patient can tolerate. -Patient will need either LTAC placement or another outpatient hemodialysis unit needs to be found.   Chronic thrombus left upper extremity -- Eliquis held again on 06/09/2021 in case patient would undergo G-tube placement.  Will wait for cortrak to placement by dietitian before we resume Eliquis secondary   Hypotension: -Blood pressure currently is stable.  Continue midodrine  Stage II mid coccyx pressure injury, present on admission -Continue wound care  Acute metabolic encephalopathy/renal failure to thrive/generalized deconditioning/very poor oral intake Goals of care -Neurology has already signed off.  Overall prognosis is very poor. - Due to her poor oral intake, IR was consulted for PEG placement; unfortunately unable to place on 8/4 due to overlying transverse colon.  General surgery was consulted on 8/5 for surgical G-tube placement but family was not decided at that time. -If condition does not improve, recommend total comfort measures. -Oral remains very poor -General surgery was reconsulted on 06/09/2021 for possible G-tube placement: After general surgery discussion with family, family has agreed with cortrak placement and initiation of feeding through the tube.  Dietitian has been reconsulted  Hyponatremia -Being managed by nephrology by dialysis.  Urinary tract infection -Completed 3-day course of ceftriaxone  Hepatic hypodensity -Incidental finding of an ovoid hepatic hypodensity anterior aspect of liver measuring up to 4.9 cm, could represent mass versus focal fatty infiltration.  Will need outpatient follow-up with MRI liver for further evaluation if goals of care remain aggressive.  Leukocytosis -Resolved  GERD -Continue PPI  DVT prophylaxis: Eliquis held again Code Status: DNR Family Communication: Spoke to [employer] and Kim on phone on 06/09/21 Disposition Plan: Status is: Inpatient  Remains inpatient appropriate  because:Inpatient level of care appropriate due to severity of illness  Dispo: The patient is from: SNF              Anticipated d/c is to: SNF.  Might have to transfer her to [facility]              Patient currently is not medically stable to d/c.   Difficult to place patient No  Consultants: Neurology/palliative care/general surgery/IR/GI  Procedures:  EEG 8/1: Suggestive of cortical dysfunction right hemisphere likely secondary to underlying structural abnormality/stroke, moderate encephalopathy, no seizure activity or epileptiform discharges. EGD 7/27, normal exam Flex sigmoidoscopy 7/27: 4 nonbleeding ulcers  Antimicrobials:  Anti-infectives (From admission, onward)    Start     Dose/Rate Route Frequency Ordered Stop   05/29/21 1622  ceFAZolin (ANCEF) 2-4 GM/100ML-% IVPB       Note to Pharmacy: Lytle Butte   : cabinet override      05/29/21 1622 05/30/21 0429   05/29/21 0000  cefTRIAXone (ROCEPHIN) 1 g in sodium chloride 0.9 % 100 mL IVPB  Status:  Discontinued        1 g 200 mL/hr over 30 Minutes Intravenous Every 24 hours 05/28/21 1801 05/28/21 1801   05/28/21 1830  cefTRIAXone (ROCEPHIN) 1 g in sodium chloride 0.9 % 100 mL IVPB        1 g 200 mL/hr over 30 Minutes Intravenous Every 24 hours 05/28/21 1801 05/30/21 1907   05/28/21 1630  ceFAZolin (ANCEF) IVPB 2g/100 mL premix        2 g 200 mL/hr over 30 Minutes Intravenous To Radiology 05/28/21 1537 05/29/21 1630        Subjective: Patient seen and examined at bedside.  Still confused; extremely poor  historian.  No fever, seizures, vomiting reported to nursing staff.  Oral intake is still poor.  Objective: Vitals:   06/09/21 1027 06/09/21 1723 06/09/21 2057 06/10/21 0434  BP: (!) 101/55 (!) 109/59 (!) 134/94 (!) 121/58  Pulse: 92 90 88 96  Resp: '16 19 18 20  '$ Temp: (!) 97.5 F (36.4 C)  97.6 F (36.4 C) 97.7 F (36.5 C)  TempSrc: Oral  Oral Oral  SpO2: 95% 94% 99%   Weight:      Height:        Intake/Output  Summary (Last 24 hours) at 06/10/2021 0739 Last data filed at 06/09/2021 1300 Gross per 24 hour  Intake 0 ml  Output --  Net 0 ml    Filed Weights   06/07/21 0433 06/08/21 0538 06/09/21 0611  Weight: 56 kg 57 kg 56.9 kg    Examination:  General exam: Looks chronically ill and deconditioned.  No distress.  On 2 L oxygen by nasal cannula.   Respiratory system: Bilateral decreased breath sounds at bases with scattered crackles  cardiovascular system: S1-S2 heard; rate controlled  gastrointestinal system: Abdomen is mildly distended, soft and nontender.  Normal bowel sounds heard extremities: No clubbing; bilateral lower extremity edema present Central nervous system: Wakes up slightly, extremely confused.  No other obvious focal deficits.  Left-sided weakness present.   Skin: No obvious ecchymosis/rashes psychiatry: Cannot assess because of mental status  Data Reviewed: I have personally reviewed following labs and imaging studies  CBC: Recent Labs  Lab 06/04/21 0331 06/06/21 0337 06/09/21 0828  WBC 8.2 5.8 8.6  NEUTROABS  --  3.7 6.0  HGB 8.1* 8.5* 8.6*  HCT 25.5* 26.5* 26.5*  MCV 97.7 96.7 94.0  PLT 199 240 [id number]    Basic Metabolic Panel: Recent Labs  Lab 06/04/21 0331 06/06/21 0337 06/09/21 0828  NA 134* 130* 125*  K 3.7 3.7 3.6  CL 99 93* 88*  CO2 '25 26 28  '$ GLUCOSE 83 85 88  BUN '12 18 12  '$ CREATININE 4.15* 5.31* 4.76*  CALCIUM 7.8* 8.0* 7.7*    GFR: Estimated Creatinine Clearance: 9.5 mL/min (A) (by C-G formula based on SCr of 4.76 mg/dL (H)). Liver Function Tests: No results for input(s): AST, ALT, ALKPHOS, BILITOT, PROT, ALBUMIN in the last 168 hours.  No results for input(s): LIPASE, AMYLASE in the last 168 hours. No results for input(s): AMMONIA in the last 168 hours. Coagulation Profile: No results for input(s): INR, PROTIME in the last 168 hours. Cardiac Enzymes: No results for input(s): CKTOTAL, CKMB, CKMBINDEX, TROPONINI in the last 168  hours. BNP (last 3 results) No results for input(s): PROBNP in the last 8760 hours. HbA1C: No results for input(s): HGBA1C in the last 72 hours. CBG: Recent Labs  Lab 06/09/21 0707 06/09/21 1230 06/09/21 1642 06/09/21 2057 06/10/21 0630  GLUCAP 80 88 101* 96 82    Lipid Profile: No results for input(s): CHOL, HDL, LDLCALC, TRIG, CHOLHDL, LDLDIRECT in the last 72 hours. Thyroid Function Tests: No results for input(s): TSH, T4TOTAL, FREET4, T3FREE, THYROIDAB in the last 72 hours. Anemia Panel: No results for input(s): VITAMINB12, FOLATE, FERRITIN, TIBC, IRON, RETICCTPCT in the last 72 hours. Sepsis Labs: No results for input(s): PROCALCITON, LATICACIDVEN in the last 168 hours.  No results found for this or any previous visit (from the past 240 hour(s)).       Radiology Studies: No results found.      Scheduled Meds:   stroke: mapping our early stages of recovery book  Does not apply Once   (feeding supplement) PROSource Plus  30 mL Oral BID BM   atorvastatin  40 mg Oral Daily   collagenase   Topical Daily   darbepoetin (ARANESP) injection - DIALYSIS  100 mcg Intravenous Q Sat-HD   feeding supplement (NEPRO CARB STEADY)  237 mL Oral TID BM   gabapentin  100 mg Oral QHS   midodrine  10 mg Oral TID WC   mirtazapine  7.5 mg Oral QHS   multivitamin  1 tablet Oral QHS   pantoprazole  40 mg Oral Q0600   sodium chloride flush  3 mL Intravenous Q12H   thiamine injection  100 mg Intravenous Daily   venlafaxine XR  75 mg Oral Q breakfast   Continuous Infusions:  sodium chloride     dextrose 50 mL/hr at 06/09/21 1607          Manolo Bosket, MD [facility] 06/10/2021, 7:39 AM

## 2021-06-10 NOTE — Procedures (Signed)
Patient seen and examined on Hemodialysis. BP 92/78   Pulse 81   Temp (!) 97.1 F (36.2 C) (Axillary)   Resp 20   Ht '5\' 7"'$  (1.702 m)   Wt 56.9 kg   LMP  (LMP Unknown)   SpO2 98%   BMI 19.65 kg/m   QB 400 mL/ min via LUE AVF, UF goal 2L  Appears uncomfortable and is continuously moaning and saying "ouch".    Madelon Lips MD [facility] Pgr [phone] 12:01 PM

## 2021-06-10 NOTE — Progress Notes (Signed)
Nutrition Follow-up  DOCUMENTATION CODES:   Severe malnutrition in context of chronic illness  INTERVENTION:  Once Cortrak is placed and ready for use: -Begin Osmolite 1.5 @ 66m/hr, advance by 155mhr Q8H until goal rate of 5051mr (1200m13m is reached -45ml23msource TF daily  At goal, TF will provide 1840 kcals, 86g protein, 914ml 67m water  Once feeding is initiated, monitor magnesium, potassium, and phosphorus daily for at least 3 days, MD to replete as needed, as pt is at risk for refeeding syndrome given prolonged inadequate po intake.  -can discontinue oral nutrition supplements while pt is receiving TF  NUTRITION DIAGNOSIS:   Severe Malnutrition related to chronic illness (ESRD on HD, prior CVA) as evidenced by percent weight loss, severe muscle depletion, energy intake < or equal to 75% for > or equal to 1 month.  ongoing  GOAL:   Patient will meet greater than or equal to 90% of their needs  Not met  MONITOR:   PO intake, Supplement acceptance, Diet advancement, Labs, Weight trends, Skin, I & O's  REASON FOR ASSESSMENT:   Consult Enteral/tube feeding initiation and management  ASSESSMENT:   75 yo 8male with PMH of ESRD on HD MWF, HTN, HLD, T2DM, CVA s/p TPA 02/2021, ?blood clot in UE, left hemiparesis from CVA who presented to ER for dark stool with + fecal occult.  Pt has had continued poor po intake and, unfortunately, was determined to be a poor candidate for PEG/G-tube placement by both IR and Surgery. After GOC di[place] Indiosssions with pt's family, decision was made to pursue trial of TF via Cortrak while pt continues to undergo dialysis at the hospital. As of right now, pt will not be accepted back to outpatient dialysis per Nephrology due to her underlying confusion, weakness, and inability to safely sit in a dialysis recliner as an outpatient. PMT following. RD is concerned pt will not tolerate Cortrak placement. Also concern regarding ultimate plan of care as  Cortrak is unlikely to stimulate appetite and is not a long-term solution for this pt's inadequate intake. Comfort-focused feeding/care is more appropriate at this point. Discussed with MD. Plan to proceed with Cortrak trial as per family's wishes.   Please note Cortrak schedule is Monday, Wednesday, and Friday.   PO intake: 0% x last 8 recorded meals   Medications: Scheduled Meds:   stroke: mapping our early stages of recovery book   Does not apply Once   atorvastatin  40 mg Oral Daily   collagenase   Topical Daily   darbepoetin (ARANESP) injection - DIALYSIS  100 mcg Intravenous Q Sat-HD   [START ON 06/11/2021] feeding supplement (PROSource TF)  45 mL Per Tube BID   gabapentin  100 mg Oral QHS   midodrine  10 mg Oral TID WC   mirtazapine  7.5 mg Oral QHS   multivitamin  1 tablet Oral QHS   pantoprazole  40 mg Oral Q0600   sodium chloride flush  3 mL Intravenous Q12H   thiamine injection  100 mg Intravenous Daily   venlafaxine XR  75 mg Oral Q breakfast  Continuous Infusions:  sodium chloride     dextrose 50 mL/hr at 06/09/21 1607   [START ON 06/11/2021] feeding supplement (OSMOLITE 1.5 CAL)     Labs: Recent Labs  Lab 06/04/21 0331 06/06/21 0337 06/09/21 0828  NA 134* 130* 125*  K 3.7 3.7 3.6  CL 99 93* 88*  CO2 _0 BUN _1 CREATININE 4.15* 5.31* 4.76*  CALCIUM 7.8* 8.0* 7.7*  GLUCOSE 83 85 88   No UOP documented x24 hours  Last HD today, 865m net UF I/O: +78957msince admit  Pt is below EDW  EDW 62.5 kg Current weight: 56.9 kg Admission weight: 58.1 kg  Diet Order:   Diet Order             DIET DYS 3 Room service appropriate? Yes; Fluid consistency: Thin  Diet effective now                   EDUCATION NEEDS:   Education needs have been addressed  Skin:  Skin Assessment: Skin Integrity Issues: Skin Integrity Issues:: Stage II Stage II: Pressure Injury on coccyx; Open wound on buttocks  Last BM:  8/14  Height:   Ht Readings from  Last 1 Encounters:  05/20/21 _0  (1.702 m)    Weight:   Wt Readings from Last 1 Encounters:  06/09/21 56.9 kg   BMI:  Body mass index is 19.65 kg/m.  Estimated Nutritional Needs:   Kcal:  1700-1900  Protein:  85-100 grams  Fluid:  1L +UOP    AmLarkin InaMS, RD, LDN (she/her/hers) RD pager number and weekend/on-call pager number located in [place]

## 2021-06-11 ENCOUNTER — Inpatient Hospital Stay (HOSPITAL_COMMUNITY): Payer: Medicare Other

## 2021-06-11 DIAGNOSIS — K922 Gastrointestinal hemorrhage, unspecified: Secondary | ICD-10-CM | POA: Diagnosis not present

## 2021-06-11 LAB — BASIC METABOLIC PANEL
Anion gap: 9 (ref 5–15)
BUN: 7 mg/dL — ABNORMAL LOW (ref 8–23)
CO2: 27 mmol/L (ref 22–32)
Calcium: 8.2 mg/dL — ABNORMAL LOW (ref 8.9–10.3)
Chloride: 92 mmol/L — ABNORMAL LOW (ref 98–111)
Creatinine, Ser: 3.69 mg/dL — ABNORMAL HIGH (ref 0.44–1.00)
GFR, Estimated: 12 mL/min — ABNORMAL LOW (ref >60)
Glucose, Bld: 131 mg/dL — ABNORMAL HIGH (ref 70–99)
Potassium: 4.2 mmol/L (ref 3.5–5.1)
Sodium: 128 mmol/L — ABNORMAL LOW (ref 135–145)

## 2021-06-11 LAB — CBC WITH DIFFERENTIAL/PLATELET
Abs Immature Granulocytes: 0.04 10*3/uL (ref 0.00–0.07)
Basophils Absolute: 0 10*3/uL (ref 0.0–0.1)
Basophils Relative: 0 %
Eosinophils Absolute: 0.2 10*3/uL (ref 0.0–0.5)
Eosinophils Relative: 2 %
HCT: 23.1 % — ABNORMAL LOW (ref 36.0–46.0)
Hemoglobin: 7.4 g/dL — ABNORMAL LOW (ref 12.0–15.0)
Immature Granulocytes: 1 %
Lymphocytes Relative: 13 %
Lymphs Abs: 1 10*3/uL (ref 0.7–4.0)
MCH: 30.2 pg (ref 26.0–34.0)
MCHC: 32 g/dL (ref 30.0–36.0)
MCV: 94.3 fL (ref 80.0–100.0)
Monocytes Absolute: 1 10*3/uL (ref 0.1–1.0)
Monocytes Relative: 13 %
Neutro Abs: 5.4 10*3/uL (ref 1.7–7.7)
Neutrophils Relative %: 71 %
Platelets: 191 10*3/uL (ref 150–400)
RBC: 2.45 MIL/uL — ABNORMAL LOW (ref 3.87–5.11)
RDW: 17.4 % — ABNORMAL HIGH (ref 11.5–15.5)
WBC: 7.6 10*3/uL (ref 4.0–10.5)
nRBC: 0 % (ref 0.0–0.2)

## 2021-06-11 LAB — GLUCOSE, CAPILLARY
Glucose-Capillary: 122 mg/dL — ABNORMAL HIGH (ref 70–99)
Glucose-Capillary: 130 mg/dL — ABNORMAL HIGH (ref 70–99)
Glucose-Capillary: 140 mg/dL — ABNORMAL HIGH (ref 70–99)

## 2021-06-11 LAB — PHOSPHORUS
Phosphorus: 2.5 mg/dL (ref 2.5–4.6)
Phosphorus: 2.5 mg/dL (ref 2.5–4.6)

## 2021-06-11 LAB — MAGNESIUM
Magnesium: 1.7 mg/dL (ref 1.7–2.4)
Magnesium: 1.7 mg/dL (ref 1.7–2.4)

## 2021-06-11 NOTE — Consult Note (Signed)
WOC consulted for skin assessment for area of concern on the patient's back.   Discoloration in person of color; not pressure.   Discussed POC with patient and bedside nurse.  Re consult if needed, will not follow at this time. Thanks  Eshika Reckart [employer], RN,CWOCN, CNS, [place] [phone])

## 2021-06-11 NOTE — Progress Notes (Signed)
[facility] Progress Note   Subjective:    Seen in room. Had dialysis yesterday, moaning most of the treatment. Plans for Cortrak today.   Objective Vitals:   06/10/21 2112 06/11/21 0446 06/11/21 0518 06/11/21 0900  BP: (!) 103/59 (!) 102/58  (!) 94/52  Pulse: 87 96  87  Resp: _0 Temp: 97.6 F (36.4 C) 98.3 F (36.8 C)  97.8 F (36.6 C)  TempSrc: Oral Oral Oral Oral  SpO2: 100% 100%  100%  Weight:  60 kg    Height:       Physical Exam General:Chronically ill-appearing female; NAD Heart: S1/S2; No murmurs, gallops, or rubs Lungs: Clear throughout; No wheezing, rales, or friction rub Abdomen: Soft, non-tender  Extremities: Trace edema R hip. No edema BLLE Dialysis Access:  L AVG (+) Bruit/Thrill   Filed Weights   06/08/21 0538 06/09/21 0611 06/11/21 0446  Weight: 57 kg 56.9 kg 60 kg    Intake/Output Summary (Last 24 hours) at 06/11/2021 1003 Last data filed at 06/11/2021 0900 Gross per 24 hour  Intake 0 ml  Output 800 ml  Net -800 ml     Additional Objective Labs: Basic Metabolic Panel: Recent Labs  Lab 06/06/21 0337 06/09/21 0828 06/11/21 0537  NA 130* 125* 128*  K 3.7 3.6 4.2  CL 93* 88* 92*  CO2 _1 GLUCOSE 85 88 131*  BUN 18 12 7*  CREATININE 5.31* 4.76* 3.69*  CALCIUM 8.0* 7.7* 8.2*  PHOS  --   --  2.5    Liver Function Tests: No results for input(s): AST, ALT, ALKPHOS, BILITOT, PROT, ALBUMIN in the last 168 hours. No results for input(s): LIPASE, AMYLASE in the last 168 hours. CBC: Recent Labs  Lab 06/06/21 0337 06/09/21 0828 06/11/21 0537  WBC 5.8 8.6 7.6  NEUTROABS 3.7 6.0 5.4  HGB 8.5* 8.6* 7.4*  HCT 26.5* 26.5* 23.1*  MCV 96.7 94.0 94.3  PLT 240 210 191    Blood Culture    Component Value Date/Time   SDES URINE, CATHETERIZED 05/02/2021 0022   SPECREQUEST NONE 05/02/2021 0022   CULT  05/02/2021 0022    NO GROWTH Performed at [facility], [place] [address] [place], [place] [address]     REPTSTATUS 05/04/2021 FINAL 05/02/2021 0022    Cardiac Enzymes: No results for input(s): CKTOTAL, CKMB, CKMBINDEX, TROPONINI in the last 168 hours. CBG: Recent Labs  Lab 06/09/21 2057 06/10/21 0630 06/10/21 1649 06/10/21 2113 06/11/21 0633  GLUCAP 96 82 117* 134* 122*    Iron Studies: No results for input(s): IRON, TIBC, TRANSFERRIN, FERRITIN in the last 72 hours. Lab Results  Component Value Date   INR 0.9 03/10/2021   INR 1.0 01/26/2021   Studies/Results: No results found.  Medications:  sodium chloride     dextrose 50 mL/hr at 06/11/21 0531   feeding supplement (OSMOLITE 1.5 CAL)       stroke: mapping our early stages of recovery book   Does not apply Once   atorvastatin  40 mg Oral Daily   collagenase   Topical Daily   darbepoetin (ARANESP) injection - DIALYSIS  100 mcg Intravenous Q Sat-HD   feeding supplement (PROSource TF)  45 mL Per Tube Daily   gabapentin  100 mg Oral QHS   midodrine  10 mg Oral TID WC   mirtazapine  7.5 mg Oral QHS   multivitamin  1 tablet Oral QHS   pantoprazole  40 mg Oral Q0600  sodium chloride flush  3 mL Intravenous Q12H   thiamine injection  100 mg Intravenous Daily   venlafaxine XR  75 mg Oral Q breakfast    Dialysis Orders: AF TTS here 3h 86mn 400/500    62.5kg    3K/2.5 Ca    P2  AVG HeRO   Hep none - Mircera 50 q 2 wks  (7/13)  Venofer 50 q wk - Parsabiv 547mIV q HDlower extremity edema, left upper arm swelling, nontender dialysis Access: Left AVG positive bruit, with upper arm swelling  Assessment/Plan: AMS/FTT: Providers and palliative care met w/ family and discussed pt's decline. Due to declining mental and physical status we don't anticipate she will be able to do OP dialysis.  Pt will have to be able to sit in a chair for 4-6 hrs to qualify for OP HD w/ our group. If she is able to sit for dialysis and cooperate, then the next step would to re-CLIP the patient as her OP HD unit will not take her back.  Trial of recliner  dialysis this week. Cortrak per RD for nutrition support.  Melena/Rectal bleeding - GI consulted. Hx rectal ulcers.  EGD 7/27 normal / flex sig =mucosal ulceration c/w sterocoral ulcers, non bleeding.  Hgb stable 8.6 ESRD: HD TTS here (usual MWF outpatient).  Continue TTS for now pending dispo. Next HD 8/18 LUE edema: Had f'gram 6/23 which showed chronic thrombus and was started on Eliquis - currently on hold due to #1. Contracture distal to AVF Hypertension/volume  - BP soft. On midodrine for BP support.  Edema improving, UF as tolerated.  Under edw if weights correct. Nutrition: Albumin low. Continue protein supps.  TF per RD once Cortrak in place.  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  Anemia  - Hgb 7.4 Aranesp 10020mIV q Saturday.  Dispo: DNR/DNI. Poor prognosis. Palliative care following to establish goals of care. Appreciate their involvement. We do not belive dialysis is adding to her quality of life. Per last note, family wishes to continue dialysis for now. At this point does not appear suitable for outpatient dialysis.    [facility]da [facility] 06/11/2021,10:03 AM

## 2021-06-11 NOTE — Procedures (Signed)
Cortrak  Person Inserting Tube:  Esaw Dace, RD Tube Type:  Cortrak - 43 inches Tube Size:  10 Tube Location:  Left nare Initial Placement:  Stomach Secured by: Bridle Technique Used to Measure Tube Placement:  Marking at nare/corner of mouth Cortrak Secured At:  65 cm  Cortrak Tube Team Note:  Consult received to place a Cortrak feeding tube.   X-ray is required, abdominal x-ray has been ordered by the Cortrak team. Please confirm tube placement before using the Cortrak tube.   If the tube becomes dislodged please keep the tube and contact the Cortrak team at [email or web address] ([email or web address]) for replacement.  If after hours and replacement cannot be delayed, place a NG tube and confirm placement with an abdominal x-ray.   Kerman Passey MS, RDN, LDN, CNSC Registered Dietitian III Clinical Nutrition RD Pager and On-Call Pager Number Located in [place]

## 2021-06-11 NOTE — Progress Notes (Signed)
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  On presentation, hemoglobin was 9.6; GI was consulted.  She underwent EGD which was normal and flexible sigmoidoscopy which showed nonbleeding distal rectal ulcers/stercoral ulcers.  Subsequently, GI signed off. Given the hospitalizing, neurology was also consulted for altered mental status; MRI brain was similar to prior MRI at [facility] with a large right MCA stroke.  Nephrology following for dialysis needs; nephrology is stating that patient will not be accepted back to outpatient dialysis at this time due to her underlying confusion, weakness and unsafe to sit in a dialysis recliner as an outpatient.  Due to her poor oral intake, IR was consulted for possible PEG tube placement but IR was unable to place because of overlying transverse colon.  General surgery was consulted for G-tube placement but family currently undecided.  Palliative care following for goals of care discussion.    Assessment & Plan:   Principal Problem:   GI bleed Active Problems:   Essential hypertension   Type 2 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, with long-term current use of insulin (HCC)   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   ESRD on dialysis ([place])   Dyslipidemia   Hypokalemia   Stage 2 skin ulcer of sacral region [facility])   Cerebral thrombosis with cerebral infarction   Protein-calorie malnutrition,  severe   GI bleed secondary to rectal ulcers: Patient presented with anemia,  underwent EGD which was normal and flex sigmoidoscopy with clean base, nonbleeding distal rectal ulcers (stercoral ulcers).  Gastroenterology now signed off on 05/22/21. Hemoglobin remains stable/ No further reports of bleeding during this hospitalization.   Okay to restart Eliquis per GI Eliquis was held again on 06/09/2021.   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 MRI brain findings were similar in appearance to her prior MRI at [facility] with no drastic changes which is notable for a large right MCA stroke with advanced microvascular ischemic changes of the white matter; MRA head degraded but did not show occlusion and bilateral carotid ultrasound revealed R ICA and LICA with [id number] stenosis.  EEG with cortical dysfunction right hemisphere likely secondary to underlying structural abnormality/stroke and moderate encephalopathy with no seizure activity or epileptiform discharges.  Hemoglobin A1c 5.1, LDL 68.  No further recommendations per neurology and signed off 05/28/2021. Continue atorvastatin 40 mg p.o. daily Diet as per SLP recommendations   Hyperlipidemia : Continue statin   Type 2 diabetes mellitus with episodes of hypoglycemia Hemoglobin A1c 5.1.  Diet controlled at baseline. Patient has had episodes of hypoglycemia due to poor oral intake.  She was started on D5 drip on 06/05/2021.  Monitor CBGs.   ESRD on HD As per nephrology , Patient is not a candidate for continued outpatient HD given her confusion, weakness and inability to sit up in a chair for HD outpatient. Overall HD is not enhancing her quality of life at this time. Nephrology following, appreciate assistance,  continue on HD while inpatient  Per nephrology, Dr. Moshe Cipro will not accept back to outpatient dialysis at this time due to her underlying confusion,  weakness and unsafe to sit in a outpatient dialysis recliner as she has been pulling at the HD needles prior Nephrology is recommending consideration of transition to hospice.   As per palliative care discussion with family on 06/05/2021: Family wants to continue to treat the treatable with hopes for improvement and to continue hemodialysis as long as patient can tolerate. Patient will need either LTAC placement or another outpatient hemodialysis unit needs to be found.   Chronic thrombus left upper extremity Eliquis held again on 06/09/2021 in case patient would undergo G-tube placement.   Will wait for cortrak to placement by dietitian before we resume Eliquis .   Hypotension: Blood pressure currently is stable.  Continue midodrine   Stage II mid coccyx pressure injury, present on admission Continue wound care   Acute metabolic encephalopathy,  failure to thrive /Generalized deconditioning  /very poor oral intake Goals of care Neurology has already signed off.  Overall prognosis is very poor. Due to her poor oral intake, IR was consulted for PEG placement; unfortunately unable to place on 8/4 due to overlying transverse colon.   General surgery was consulted on 8/5 for surgical G-tube placement but family was not decided at that time. If condition does not improve, recommend total comfort measures. Oral remains very poor General surgery was reconsulted on 06/09/2021 for possible G-tube placement: After general surgery discussion with family, family has agreed with cortrak placement and initiation of feeding through the tube.  Dietitian has been reconsulted. Patient underwent cortrek placement on 2/16.   Hyponatremia. Being managed by nephrology by dialysis.   Urinary tract infection Completed 3-day course of ceftriaxone   Hepatic hypodensity Incidental finding of an ovoid hepatic hypodensity anterior aspect of liver measuring up to 4.9 cm, could represent mass versus focal fatty  infiltration.  Will need outpatient follow-up with MRI liver for further evaluation if goals of care remain aggressive.   Leukocytosis Resolved  GERD Continue PPI     DVT prophylaxis: Eliquis Held. Code Status: DNR Family Communication: No family at bed side. Disposition Plan:    Status is: Inpatient  Remains inpatient appropriate because:Inpatient level of care appropriate due to severity of illness  Dispo: The patient is from: SNF              Anticipated d/c is to: SNF              Patient currently is not medically stable to d/c.   Difficult to place patient No  Consultants:  Neurology, palliative care, general surgery, IR, GI  Procedures: EEG 8/1: Suggestive of cortical dysfunction right hemisphere likely secondary to underlying structural abnormality/stroke, moderate encephalopathy, no seizure activity or epileptiform discharges. EGD 7/27, normal exam Flex sigmoidoscopy 7/27: 4 nonbleeding ulcers  Antimicrobials:   Anti-infectives (From admission, onward)    Start     Dose/Rate Route Frequency Ordered Stop   05/29/21 1622  ceFAZolin (ANCEF) 2-4 GM/100ML-% IVPB       Note to Pharmacy: Lytle Butte   : cabinet override      05/29/21 1622 05/30/21 0429   05/29/21 0000  cefTRIAXone (ROCEPHIN) 1 g in sodium chloride 0.9 % 100 mL IVPB  Status:  Discontinued        1 g 200 mL/hr over 30 Minutes Intravenous Every 24 hours 05/28/21 1801 05/28/21 1801   05/28/21 1830  cefTRIAXone (ROCEPHIN) 1 g in sodium  chloride 0.9 % 100 mL IVPB        1 g 200 mL/hr over 30 Minutes Intravenous Every 24 hours 05/28/21 1801 05/30/21 1907   05/28/21 1630  ceFAZolin (ANCEF) IVPB 2g/100 mL premix        2 g 200 mL/hr over 30 Minutes Intravenous To Radiology 05/28/21 1537 05/29/21 1630        Subjective: Patient was seen and examined at bedside.  Overnight events noted.   Patient is nonverbal,  open eyes and pays attention on recall of her name. Chronically ill looking and very  deconditioned.  Objective: Vitals:   06/10/21 2112 06/11/21 0446 06/11/21 0518 06/11/21 0900  BP: (!) 103/59 (!) 102/58  (!) 94/52  Pulse: 87 96  87  Resp: '18 18  16  '$ Temp: 97.6 F (36.4 C) 98.3 F (36.8 C)  97.8 F (36.6 C)  TempSrc: Oral Oral Oral Oral  SpO2: 100% 100%  100%  Weight:  60 kg    Height:        Intake/Output Summary (Last 24 hours) at 06/11/2021 1550 Last data filed at 06/11/2021 1300 Gross per 24 hour  Intake 0 ml  Output 0 ml  Net 0 ml   Filed Weights   06/08/21 0538 06/09/21 0611 06/11/21 0446  Weight: 57 kg 56.9 kg 60 kg    Examination:  General exam: Appears chronically ill looking and deconditioned.  On 2 L oxygen. Respiratory system: Decreased breath sounds bilaterally.  RR 15 Cardiovascular system: S1 & S2 heard, RRR. No JVD, murmurs, rubs, gallops or clicks. No pedal edema. Gastrointestinal system: Abdomen is soft, mildly distended, nontender , no organomegaly or masses felt. Normal bowel sounds heard. Central nervous system: Alert, arousable.  Nonverbal, left-sided weakness present Extremities: No clubbing, no cyanosis, no edema Skin: No rashes, lesions or ulcers Psychiatry: Judgement and insight appear normal. Mood & affect appropriate.     Data Reviewed: I have personally reviewed following labs and imaging studies  CBC: Recent Labs  Lab 06/06/21 0337 06/09/21 0828 06/11/21 0537  WBC 5.8 8.6 7.6  NEUTROABS 3.7 6.0 5.4  HGB 8.5* 8.6* 7.4*  HCT 26.5* 26.5* 23.1*  MCV 96.7 94.0 94.3  PLT 240 210 [id number]   Basic Metabolic Panel: Recent Labs  Lab 06/06/21 0337 06/09/21 0828 06/11/21 0537  NA 130* 125* 128*  K 3.7 3.6 4.2  CL 93* 88* 92*  CO2 '26 28 27  '$ GLUCOSE 85 88 131*  BUN 18 12 7*  CREATININE 5.31* 4.76* 3.69*  CALCIUM 8.0* 7.7* 8.2*  MG  --   --  1.7  PHOS  --   --  2.5   GFR: Estimated Creatinine Clearance: 12.9 mL/min (A) (by C-G formula based on SCr of 3.69 mg/dL (H)). Liver Function Tests: No results for input(s):  AST, ALT, ALKPHOS, BILITOT, PROT, ALBUMIN in the last 168 hours. No results for input(s): LIPASE, AMYLASE in the last 168 hours. No results for input(s): AMMONIA in the last 168 hours. Coagulation Profile: No results for input(s): INR, PROTIME in the last 168 hours. Cardiac Enzymes: No results for input(s): CKTOTAL, CKMB, CKMBINDEX, TROPONINI in the last 168 hours. BNP (last 3 results) No results for input(s): PROBNP in the last 8760 hours. HbA1C: No results for input(s): HGBA1C in the last 72 hours. CBG: Recent Labs  Lab 06/10/21 0630 06/10/21 1649 06/10/21 2113 06/11/21 0633 06/11/21 1113  GLUCAP 82 117* 134* 122* 130*   Lipid Profile: No results for input(s): CHOL, HDL, LDLCALC, TRIG,  CHOLHDL, LDLDIRECT in the last 72 hours. Thyroid Function Tests: No results for input(s): TSH, T4TOTAL, FREET4, T3FREE, THYROIDAB in the last 72 hours. Anemia Panel: No results for input(s): VITAMINB12, FOLATE, FERRITIN, TIBC, IRON, RETICCTPCT in the last 72 hours. Sepsis Labs: No results for input(s): PROCALCITON, LATICACIDVEN in the last 168 hours.  No results found for this or any previous visit (from the past 240 hour(s)).   Radiology Studies: DG Abd Portable 1V  Result Date: 06/11/2021 CLINICAL DATA:  Feeding tube placement. EXAM: PORTABLE ABDOMEN - 1 VIEW COMPARISON:  IR fluoroscopy, 05/29/2021. CT abdomen pelvis, 05/27/2021. FINDINGS: Enteric feeding tube, with tip projected to the right of midline. The imaged bowel is not obstructed. Contrast within colon, likely ingested. No interval osseous abnormality. IMPRESSION: Transpyloric enteric feeding tube Electronically Signed   By: Michaelle Birks M.D.   On: 06/11/2021 10:53     Scheduled Meds:   stroke: mapping our early stages of recovery book   Does not apply Once   atorvastatin  40 mg Oral Daily   collagenase   Topical Daily   darbepoetin (ARANESP) injection - DIALYSIS  100 mcg Intravenous Q Sat-HD   feeding supplement (PROSource TF)   45 mL Per Tube Daily   gabapentin  100 mg Oral QHS   midodrine  10 mg Oral TID WC   mirtazapine  7.5 mg Oral QHS   multivitamin  1 tablet Oral QHS   pantoprazole  40 mg Oral Q0600   sodium chloride flush  3 mL Intravenous Q12H   thiamine injection  100 mg Intravenous Daily   venlafaxine XR  75 mg Oral Q breakfast   Continuous Infusions:  sodium chloride     dextrose 50 mL/hr at 06/11/21 0531   feeding supplement (OSMOLITE 1.5 CAL) 1,000 mL (06/11/21 1437)     LOS: 22 days    Time spent: 35 mins    Amy Massaro, MD [facility]   If 7PM-7AM, please contact night-coverage

## 2021-06-12 DIAGNOSIS — K922 Gastrointestinal hemorrhage, unspecified: Secondary | ICD-10-CM | POA: Diagnosis not present

## 2021-06-12 LAB — BASIC METABOLIC PANEL
Anion gap: 8 (ref 5–15)
BUN: 9 mg/dL (ref 8–23)
CO2: 27 mmol/L (ref 22–32)
Calcium: 7.8 mg/dL — ABNORMAL LOW (ref 8.9–10.3)
Chloride: 89 mmol/L — ABNORMAL LOW (ref 98–111)
Creatinine, Ser: 4.43 mg/dL — ABNORMAL HIGH (ref 0.44–1.00)
GFR, Estimated: 10 mL/min — ABNORMAL LOW (ref >60)
Glucose, Bld: 205 mg/dL — ABNORMAL HIGH (ref 70–99)
Potassium: 3.8 mmol/L (ref 3.5–5.1)
Sodium: 124 mmol/L — ABNORMAL LOW (ref 135–145)

## 2021-06-12 LAB — GLUCOSE, CAPILLARY
Glucose-Capillary: 191 mg/dL — ABNORMAL HIGH (ref 70–99)
Glucose-Capillary: 194 mg/dL — ABNORMAL HIGH (ref 70–99)
Glucose-Capillary: 149 mg/dL — ABNORMAL HIGH (ref 70–99)
Glucose-Capillary: 241 mg/dL — ABNORMAL HIGH (ref 70–99)

## 2021-06-12 LAB — HEMOGLOBIN AND HEMATOCRIT, BLOOD
HCT: 22.5 % — ABNORMAL LOW (ref 36.0–46.0)
Hemoglobin: 7.4 g/dL — ABNORMAL LOW (ref 12.0–15.0)

## 2021-06-12 LAB — MAGNESIUM: Magnesium: 1.7 mg/dL (ref 1.7–2.4)

## 2021-06-12 LAB — PHOSPHORUS: Phosphorus: 2.2 mg/dL — ABNORMAL LOW (ref 2.5–4.6)

## 2021-06-12 MED ORDER — ALTEPLASE 2 MG IJ SOLR: 2.0000 mg | Freq: Once | INTRAMUSCULAR | Status: DC | PRN

## 2021-06-12 MED ORDER — THIAMINE HCL 100 MG PO TABS
100.0000 mg | ORAL_TABLET | Freq: Every day | ORAL | Status: DC
  Administered 2021-06-12 – 2021-06-13 (×2): 100 mg via ORAL
  Filled 2021-06-12 (×2): qty 1

## 2021-06-12 MED ORDER — SODIUM CHLORIDE 0.9 % IV SOLN: 100.0000 mL | INTRAVENOUS | Status: DC | PRN

## 2021-06-12 MED ORDER — LIDOCAINE HCL (PF) 1 % IJ SOLN: 5.0000 mL | INTRAMUSCULAR | Status: DC | PRN

## 2021-06-12 MED ORDER — HEPARIN SODIUM (PORCINE) 1000 UNIT/ML DIALYSIS: 1000.0000 [IU] | INTRAMUSCULAR | Status: DC | PRN

## 2021-06-12 MED ORDER — PENTAFLUOROPROP-TETRAFLUOROETH EX AERO: 1.0000 "application " | INHALATION_SPRAY | CUTANEOUS | Status: DC | PRN

## 2021-06-12 MED ORDER — LIDOCAINE-PRILOCAINE 2.5-2.5 % EX CREA: 1.0000 "application " | TOPICAL_CREAM | CUTANEOUS | Status: DC | PRN

## 2021-06-12 NOTE — Progress Notes (Signed)
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  On presentation, hemoglobin was 9.6; GI was consulted.  She underwent EGD which was normal and flexible sigmoidoscopy which showed nonbleeding distal rectal ulcers/stercoral ulcers.  Subsequently, GI signed off. Neurology was also consulted for altered mental status; MRI brain was similar to prior MRI at [facility] with a large right MCA stroke.  Nephrology following for dialysis needs; nephrology is stating that patient will not be accepted back to outpatient dialysis at this time due to her underlying confusion, weakness and unsafe to sit in a dialysis recliner as an outpatient.  Due to her poor oral intake, IR was consulted for possible PEG tube placement but IR was unable to place because of overlying transverse colon.  General surgery was consulted for G-tube placement but family currently undecided.  Palliative care following for goals of care discussion.    Assessment & Plan:   Principal Problem:   GI bleed Active Problems:   Essential hypertension   Type 2 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, with long-term current use of insulin (HCC)   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   ESRD on dialysis ([place])   Dyslipidemia   Hypokalemia   Stage 2 skin ulcer of sacral region [facility])   Cerebral thrombosis with cerebral infarction   Protein-calorie malnutrition, severe   GI bleed  secondary to rectal ulcers: Patient presented with anemia,  underwent EGD which was normal and flex sigmoidoscopy with clean base, nonbleeding distal rectal ulcers (stercoral ulcers).  Gastroenterology now signed off on 05/22/21. Hemoglobin remains stable. / No further reports of bleeding during this hospitalization.   Okay to restart Eliquis per GI Eliquis was held again on 06/09/2021.   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 MRI brain findings were similar in appearance to her prior MRI at [facility] with no drastic changes which is notable for a large right MCA stroke with advanced microvascular ischemic changes of the white matter; MRA head degraded but did not show occlusion and bilateral carotid ultrasound revealed R ICA and LICA with [id number] stenosis.  EEG with cortical dysfunction right hemisphere likely secondary to underlying structural abnormality/stroke and moderate encephalopathy with no seizure activity or epileptiform discharges.  Hemoglobin A1c 5.1, LDL 68.  No further recommendations per neurology and signed off 05/28/2021. Continue atorvastatin 40 mg p.o. daily Diet as per SLP recommendations   Hyperlipidemia : Continue statin   Type 2 diabetes mellitus with episodes of hypoglycemia Hemoglobin A1c 5.1.  Diet controlled at baseline. Patient has had episodes of hypoglycemia due to poor oral intake.  She was started on D5 drip on 06/05/2021.  Monitor CBGs.   ESRD on HD As per nephrology , Patient is not a candidate for continued outpatient HD given her confusion, weakness and inability to sit up in a chair for HD outpatient.  Overall HD is not enhancing her quality of life at this time. Continue on HD while inpatient Per nephrology, Dr. Moshe Cipro will not  accept back to outpatient dialysis at this time due to her underlying confusion, weakness and unsafe to sit in a outpatient dialysis recliner as  she has been pulling at the HD needles prior Nephrology is recommending consideration of transition to hospice.   As per palliative care discussion with family on 06/05/2021: Family wants to continue to treat the treatable with hopes for improvement and to continue hemodialysis as long as patient can tolerate. Patient will need either LTAC placement or another outpatient hemodialysis unit needs to be found.   Chronic thrombus left upper extremity. Eliquis held again on 06/09/2021 in case patient would undergo G-tube placement.   Will wait for cortrak to placement by dietitian before we resume Eliquis .   Hypotension: Blood pressure currently is stable.  Continue midodrine   Stage II mid coccyx pressure injury, present on admission Continue wound care   Acute metabolic encephalopathy,  failure to thrive /Generalized deconditioning  /very poor oral intake Goals of care discussion. Neurology has already signed off.  Overall prognosis is very poor. Due to her poor oral intake, IR was consulted for PEG placement; unfortunately unable to place on 8/4 due to overlying transverse colon.   General surgery was consulted on 8/5 for surgical G-tube placement but family was not decided at that time. If condition does not improve, recommend total comfort measures. Oral remains very poor General surgery was reconsulted on 06/09/2021 for possible G-tube placement: After general surgery discussion with family, family has agreed with cortrak placement and initiation of feeding through the tube.  Dietitian has been reconsulted. Patient underwent cortrek placement on 2/16.   Hyponatremia. Being managed by nephrology by dialysis.   Urinary tract infection Completed 3-day course of ceftriaxone   Hepatic hypodensity Incidental finding of an ovoid hepatic hypodensity anterior aspect of liver measuring up to 4.9 cm, could represent mass versus focal fatty infiltration.   Will need outpatient follow-up with MRI  liver for further evaluation if goals of care remain aggressive.   Leukocytosis Resolved.  GERD Continue PPI     DVT prophylaxis: Eliquis Held. Code Status: DNR Family Communication: No family at bed side. Disposition Plan:    Status is: Inpatient  Remains inpatient appropriate because:Inpatient level of care appropriate due to severity of illness  Dispo: The patient is from: SNF              Anticipated d/c is to: SNF              Patient currently is not medically stable to d/c.   Difficult to place patient No  Consultants:  Neurology, palliative care, general surgery, IR, GI  Procedures: EEG 8/1: Suggestive of cortical dysfunction right hemisphere likely secondary to underlying structural abnormality/stroke, moderate encephalopathy, no seizure activity or epileptiform discharges. EGD 7/27, normal exam Flex sigmoidoscopy 7/27: 4 nonbleeding ulcers  Antimicrobials:   Anti-infectives (From admission, onward)    Start     Dose/Rate Route Frequency Ordered Stop   05/29/21 1622  ceFAZolin (ANCEF) 2-4 GM/100ML-% IVPB       Note to Pharmacy: Lytle Butte   : cabinet override      05/29/21 1622 05/30/21 0429   05/29/21 0000  cefTRIAXone (ROCEPHIN) 1 g in sodium chloride 0.9 % 100 mL IVPB  Status:  Discontinued        1 g 200 mL/hr over 30 Minutes Intravenous Every 24 hours 05/28/21 1801 05/28/21 1801   05/28/21 1830  cefTRIAXone (ROCEPHIN) 1 g in sodium chloride 0.9 % 100  mL IVPB        1 g 200 mL/hr over 30 Minutes Intravenous Every 24 hours 05/28/21 1801 05/30/21 1907   05/28/21 1630  ceFAZolin (ANCEF) IVPB 2g/100 mL premix        2 g 200 mL/hr over 30 Minutes Intravenous To Radiology 05/28/21 1537 05/29/21 1630        Subjective: Patient is seen and examined at bedside.  Overnight events noted. Patient is alert, oriented x 1.  She is following commands. Appears chronically ill looking and very deconditioned.  Objective: Vitals:   06/12/21 1245 06/12/21 1300  06/12/21 1330 06/12/21 1400  BP: (!) 141/54 (!) 140/44 111/88 (!) 130/49  Pulse: (!) 104 (!) 107 (!) 107 (!) 104  Resp: 18     Temp:      TempSrc:      SpO2: 97%     Weight:      Height:        Intake/Output Summary (Last 24 hours) at 06/12/2021 1428 Last data filed at 06/12/2021 0900 Gross per 24 hour  Intake 500 ml  Output 0 ml  Net 500 ml    Filed Weights   06/08/21 0538 06/09/21 0611 06/11/21 0446  Weight: 57 kg 56.9 kg 60 kg    Examination:  General exam: Chronically ill looking and deconditioned, lying comfortably.  On 2 L of oxygen Respiratory system: Decreased breath sounds bilaterally.  RR 15 Cardiovascular system: S1-S2 normal, regular rate and rhythm, no murmur Gastrointestinal system: Abdomen is soft, mildly distended, nontender, bowel sounds positive  Central nervous system: Alert, arousable, following commands, left-sided weakness present. Extremities: Left arm swelling improving. Skin: No rashes, lesions or ulcers Psychiatry: Mood and affect appropriate.    Data Reviewed: I have personally reviewed following labs and imaging studies  CBC: Recent Labs  Lab 06/06/21 0337 06/09/21 0828 06/11/21 0537 06/12/21 0640  WBC 5.8 8.6 7.6  --   NEUTROABS 3.7 6.0 5.4  --   HGB 8.5* 8.6* 7.4* 7.4*  HCT 26.5* 26.5* 23.1* 22.5*  MCV 96.7 94.0 94.3  --   PLT 240 210 191  --     Basic Metabolic Panel: Recent Labs  Lab 06/06/21 0337 06/09/21 0828 06/11/21 0537 06/11/21 1653 06/12/21 0640  NA 130* 125* 128*  --  124*  K 3.7 3.6 4.2  --  3.8  CL 93* 88* 92*  --  89*  CO2 '26 28 27  '$ --  27  GLUCOSE 85 88 131*  --  205*  BUN 18 12 7*  --  9  CREATININE 5.31* 4.76* 3.69*  --  4.43*  CALCIUM 8.0* 7.7* 8.2*  --  7.8*  MG  --   --  1.7 1.7 1.7  PHOS  --   --  2.5 2.5 2.2*    GFR: Estimated Creatinine Clearance: 10.7 mL/min (A) (by C-G formula based on SCr of 4.43 mg/dL (H)). Liver Function Tests: No results for input(s): AST, ALT, ALKPHOS, BILITOT, PROT,  ALBUMIN in the last 168 hours. No results for input(s): LIPASE, AMYLASE in the last 168 hours. No results for input(s): AMMONIA in the last 168 hours. Coagulation Profile: No results for input(s): INR, PROTIME in the last 168 hours. Cardiac Enzymes: No results for input(s): CKTOTAL, CKMB, CKMBINDEX, TROPONINI in the last 168 hours. BNP (last 3 results) No results for input(s): PROBNP in the last 8760 hours. HbA1C: No results for input(s): HGBA1C in the last 72 hours. CBG: Recent Labs  Lab 06/11/21 [phone] 06/11/21  1113 06/11/21 2159 06/12/21 0806 06/12/21 1123  GLUCAP 122* 130* 140* 191* 194*    Lipid Profile: No results for input(s): CHOL, HDL, LDLCALC, TRIG, CHOLHDL, LDLDIRECT in the last 72 hours. Thyroid Function Tests: No results for input(s): TSH, T4TOTAL, FREET4, T3FREE, THYROIDAB in the last 72 hours. Anemia Panel: No results for input(s): VITAMINB12, FOLATE, FERRITIN, TIBC, IRON, RETICCTPCT in the last 72 hours. Sepsis Labs: No results for input(s): PROCALCITON, LATICACIDVEN in the last 168 hours.  No results found for this or any previous visit (from the past 240 hour(s)).   Radiology Studies: DG Abd Portable 1V  Result Date: 06/11/2021 CLINICAL DATA:  Feeding tube placement. EXAM: PORTABLE ABDOMEN - 1 VIEW COMPARISON:  IR fluoroscopy, 05/29/2021. CT abdomen pelvis, 05/27/2021. FINDINGS: Enteric feeding tube, with tip projected to the right of midline. The imaged bowel is not obstructed. Contrast within colon, likely ingested. No interval osseous abnormality. IMPRESSION: Transpyloric enteric feeding tube Electronically Signed   By: Michaelle Birks M.D.   On: 06/11/2021 10:53     Scheduled Meds:  atorvastatin  40 mg Oral Daily   collagenase   Topical Daily   darbepoetin (ARANESP) injection - DIALYSIS  100 mcg Intravenous Q Sat-HD   feeding supplement (PROSource TF)  45 mL Per Tube Daily   gabapentin  100 mg Oral QHS   midodrine  10 mg Oral TID WC   mirtazapine  7.5 mg  Oral QHS   multivitamin  1 tablet Oral QHS   pantoprazole  40 mg Oral Q0600   sodium chloride flush  3 mL Intravenous Q12H   thiamine  100 mg Oral Daily   venlafaxine XR  75 mg Oral Q breakfast   Continuous Infusions:  sodium chloride     [START ON 06/13/2021] sodium chloride     [START ON 06/13/2021] sodium chloride     feeding supplement (OSMOLITE 1.5 CAL) 1,000 mL (06/11/21 1437)     LOS: 23 days    Time spent: 25 mins    Jonmichael Beadnell, MD [facility]   If 7PM-7AM, please contact night-coverage

## 2021-06-12 NOTE — Progress Notes (Signed)
[facility] Progress Note   Subjective:    Seen in room. Cortak in place. Moaning "ouch" "I'm ready" Dialysis today   Objective Vitals:   06/11/21 1652 06/11/21 2008 06/12/21 0600 06/12/21 0915  BP: (!) 101/55 (!) 169/132 114/71 (!) 120/54  Pulse: 92 100 (!) 107 (!) 105  Resp: 18 20 20 18   Temp: 98 F (36.7 C) 98.5 F (36.9 C) 98.4 F (36.9 C) 98 F (36.7 C)  TempSrc: Oral Oral Axillary Oral  SpO2: 100% 96% 100% 100%  Weight:      Height:       Physical Exam General:Chronically ill-appearing female; NAD Heart: S1/S2; No murmurs, gallops, or rubs Lungs: Clear throughout; No wheezing, rales, or friction rub Abdomen: Soft, non-tender  Extremities: Trace edema R hip. No edema BLLE Dialysis Access:  L AVG (+) Bruit/Thrill   Filed Weights   06/08/21 0538 06/09/21 0611 06/11/21 0446  Weight: 57 kg 56.9 kg 60 kg    Intake/Output Summary (Last 24 hours) at 06/12/2021 1235 Last data filed at 06/12/2021 0900 Gross per 24 hour  Intake 500 ml  Output 0 ml  Net 500 ml     Additional Objective Labs: Basic Metabolic Panel: Recent Labs  Lab 06/09/21 0828 06/11/21 0537 06/11/21 1653 06/12/21 0640  NA 125* 128*  --  124*  K 3.6 4.2  --  3.8  CL 88* 92*  --  89*  CO2 28 27  --  27  GLUCOSE 88 131*  --  205*  BUN 12 7*  --  9  CREATININE 4.76* 3.69*  --  4.43*  CALCIUM 7.7* 8.2*  --  7.8*  PHOS  --  2.5 2.5 2.2*    Liver Function Tests: No results for input(s): AST, ALT, ALKPHOS, BILITOT, PROT, ALBUMIN in the last 168 hours. No results for input(s): LIPASE, AMYLASE in the last 168 hours. CBC: Recent Labs  Lab 06/06/21 0337 06/09/21 0828 06/11/21 0537 06/12/21 0640  WBC 5.8 8.6 7.6  --   NEUTROABS 3.7 6.0 5.4  --   HGB 8.5* 8.6* 7.4* 7.4*  HCT 26.5* 26.5* 23.1* 22.5*  MCV 96.7 94.0 94.3  --   PLT 240 210 191  --     Blood Culture    Component Value Date/Time   SDES URINE, CATHETERIZED 05/02/2021 0022   SPECREQUEST NONE 05/02/2021 0022    CULT  05/02/2021 0022    NO GROWTH Performed at [facility], [place] [address] [place], [place] [address]    REPTSTATUS 05/04/2021 FINAL 05/02/2021 0022    Cardiac Enzymes: No results for input(s): CKTOTAL, CKMB, CKMBINDEX, TROPONINI in the last 168 hours. CBG: Recent Labs  Lab 06/11/21 0633 06/11/21 1113 06/11/21 2159 06/12/21 0806 06/12/21 1123  GLUCAP 122* 130* 140* 191* 194*    Iron Studies: No results for input(s): IRON, TIBC, TRANSFERRIN, FERRITIN in the last 72 hours. Lab Results  Component Value Date   INR 0.9 03/10/2021   INR 1.0 01/26/2021   Studies/Results: DG Abd Portable 1V  Result Date: 06/11/2021 CLINICAL DATA:  Feeding tube placement. EXAM: PORTABLE ABDOMEN - 1 VIEW COMPARISON:  IR fluoroscopy, 05/29/2021. CT abdomen pelvis, 05/27/2021. FINDINGS: Enteric feeding tube, with tip projected to the right of midline. The imaged bowel is not obstructed. Contrast within colon, likely ingested. No interval osseous abnormality. IMPRESSION: Transpyloric enteric feeding tube Electronically Signed   By: Michaelle Birks M.D.   On: 06/11/2021 10:53    Medications:  sodium chloride  feeding supplement (OSMOLITE 1.5 CAL) 1,000 mL (06/11/21 1437)    atorvastatin  40 mg Oral Daily   collagenase   Topical Daily   darbepoetin (ARANESP) injection - DIALYSIS  100 mcg Intravenous Q Sat-HD   feeding supplement (PROSource TF)  45 mL Per Tube Daily   gabapentin  100 mg Oral QHS   midodrine  10 mg Oral TID WC   mirtazapine  7.5 mg Oral QHS   multivitamin  1 tablet Oral QHS   pantoprazole  40 mg Oral Q0600   sodium chloride flush  3 mL Intravenous Q12H   thiamine  100 mg Oral Daily   venlafaxine XR  75 mg Oral Q breakfast    Dialysis Orders: AF TTS here 3h 65mn 400/500    62.5kg    3K/2.5 Ca    P2  AVG HeRO   Hep none - Mircera 50 q 2 wks  (7/13)  Venofer 50 q wk - Parsabiv 540mIV q HDlower extremity edema, left upper arm swelling, nontender dialysis Access: Left AVG  positive bruit, with upper arm swelling  Assessment/Plan: AMS/FTT: Providers and palliative care met w/ family and discussed pt's decline. Due to declining mental and physical status we don't anticipate she will be able to do OP dialysis.  Pt will have to be able to sit in a chair for 4-6 hrs to qualify for OP HD w/ our group. If she is able to sit for dialysis and cooperate, then the next step would to re-CLIP the patient as her OP HD unit will not take her back.  Trial of recliner dialysis this week. Cortrak per RD for nutrition support.  Melena/Rectal bleeding - GI consulted. Hx rectal ulcers.  EGD 7/27 normal / flex sig =mucosal ulceration c/w sterocoral ulcers, non bleeding.  Hgb stable 8.6 ESRD: HD TTS here (usual MWF outpatient).  Continue TTS for now pending dispo. Next HD 8/18 LUE edema: Had f'gram 6/23 which showed chronic thrombus and was started on Eliquis - currently on hold due to #1. Contracture distal to AVF Hypertension/volume  - BP soft. On midodrine for BP support.  Edema improving, UF as tolerated.  Under edw if weights correct. Nutrition: Albumin low. Continue protein supps.  TF per RD once Cortrak in place.  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  Anemia  - Hgb 7.4 Aranesp 10075mIV q Saturday.  Dispo: DNR/DNI. Poor prognosis. Palliative care following to establish goals of care. Appreciate their involvement. We do not belive dialysis is adding to her quality of life. Per last note, family wishes to continue dialysis for now. At this point does not appear suitable for outpatient dialysis.    OgeLynnda Child-C [facility] 06/12/2021,12:35 PM

## 2021-06-12 NOTE — Plan of Care (Signed)
  Problem: Health Behavior/Discharge Planning: Goal: Ability to manage health-related needs will improve Outcome: Not Progressing   Problem: Clinical Measurements: Goal: Ability to maintain clinical measurements within normal limits will improve Outcome: Not Progressing   

## 2021-06-13 ENCOUNTER — Inpatient Hospital Stay (HOSPITAL_COMMUNITY): Payer: Medicare Other

## 2021-06-13 DIAGNOSIS — K922 Gastrointestinal hemorrhage, unspecified: Secondary | ICD-10-CM | POA: Diagnosis not present

## 2021-06-13 LAB — GLUCOSE, CAPILLARY
Glucose-Capillary: 250 mg/dL — ABNORMAL HIGH (ref 70–99)
Glucose-Capillary: 274 mg/dL — ABNORMAL HIGH (ref 70–99)
Glucose-Capillary: 333 mg/dL — ABNORMAL HIGH (ref 70–99)
Glucose-Capillary: 141 mg/dL — ABNORMAL HIGH (ref 70–99)
Glucose-Capillary: 165 mg/dL — ABNORMAL HIGH (ref 70–99)
Glucose-Capillary: 158 mg/dL — ABNORMAL HIGH (ref 70–99)

## 2021-06-13 MED ORDER — PANTOPRAZOLE SODIUM 40 MG PO PACK
40.0000 mg | PACK | Freq: Every day | ORAL | Status: DC
  Administered 2021-06-14: 40 mg via ORAL
  Filled 2021-06-13: qty 20

## 2021-06-13 MED ORDER — INSULIN ASPART 100 UNIT/ML IJ SOLN: 0.0000 [IU] | Freq: Every day | INTRAMUSCULAR | Status: DC

## 2021-06-13 MED ORDER — DARBEPOETIN ALFA 150 MCG/0.3ML IJ SOSY: 150.0000 ug | PREFILLED_SYRINGE | INTRAMUSCULAR | Status: DC

## 2021-06-13 MED ORDER — GABAPENTIN 250 MG/5ML PO SOLN
100.0000 mg | Freq: Every day | ORAL | Status: DC
  Administered 2021-06-13: 100 mg via ORAL
  Filled 2021-06-13 (×3): qty 2

## 2021-06-13 MED ORDER — CHLORHEXIDINE GLUCONATE CLOTH 2 % EX PADS
6.0000 | MEDICATED_PAD | Freq: Every day | CUTANEOUS | Status: DC
  Administered 2021-06-13 – 2021-06-16 (×3): 6 via TOPICAL

## 2021-06-13 MED ORDER — VENLAFAXINE HCL 37.5 MG PO TABS
37.5000 mg | ORAL_TABLET | Freq: Two times a day (BID) | ORAL | Status: DC
  Administered 2021-06-13 – 2021-07-20 (×64): 37.5 mg
  Filled 2021-06-13 (×80): qty 1

## 2021-06-13 MED ORDER — INSULIN ASPART 100 UNIT/ML IJ SOLN
0.0000 [IU] | Freq: Three times a day (TID) | INTRAMUSCULAR | Status: DC
  Administered 2021-06-13: 7 [IU] via SUBCUTANEOUS
  Administered 2021-06-13: 2 [IU] via SUBCUTANEOUS
  Administered 2021-06-13: 1 [IU] via SUBCUTANEOUS
  Administered 2021-06-14: 2 [IU] via SUBCUTANEOUS
  Administered 2021-06-14: 3 [IU] via SUBCUTANEOUS
  Administered 2021-06-14: 2 [IU] via SUBCUTANEOUS
  Administered 2021-06-15: 3 [IU] via SUBCUTANEOUS

## 2021-06-13 NOTE — Progress Notes (Signed)
[facility] Progress Note   Subjective: Seen in room. No talking this AM. Does open eyes to verbal stimuli but not tracking.     Objective Vitals:   06/12/21 1647 06/12/21 1725 06/13/21 0142 06/13/21 0505  BP: (!) 106/56 (!) 104/57 (!) 130/41 (!) 143/40  Pulse: (!) 116 (!) 118 (!) 105 99  Resp: 20 19 14 19   Temp: 98.9 F (37.2 C) 98.9 F (37.2 C) 100.1 F (37.8 C)   TempSrc: Oral Oral Oral   SpO2: 96% 100% 100% 100%  Weight:      Height:       Physical Exam General: Chronically appearing ill female in NAD Heart: S1,S2 RRR No M/R/G Lungs: CTAB. No WOB.  Abdomen: NABS Extremities: No LE edema  Dialysis Access: L AVG + T/B   Additional Objective Labs: Basic Metabolic Panel: Recent Labs  Lab 06/09/21 0828 06/11/21 0537 06/11/21 1653 06/12/21 0640  NA 125* 128*  --  124*  K 3.6 4.2  --  3.8  CL 88* 92*  --  89*  CO2 28 27  --  27  GLUCOSE 88 131*  --  205*  BUN 12 7*  --  9  CREATININE 4.76* 3.69*  --  4.43*  CALCIUM 7.7* 8.2*  --  7.8*  PHOS  --  2.5 2.5 2.2*   Liver Function Tests: No results for input(s): AST, ALT, ALKPHOS, BILITOT, PROT, ALBUMIN in the last 168 hours. No results for input(s): LIPASE, AMYLASE in the last 168 hours. CBC: Recent Labs  Lab 06/09/21 0828 06/11/21 0537 06/12/21 0640  WBC 8.6 7.6  --   NEUTROABS 6.0 5.4  --   HGB 8.6* 7.4* 7.4*  HCT 26.5* 23.1* 22.5*  MCV 94.0 94.3  --   PLT 210 191  --    Blood Culture    Component Value Date/Time   SDES URINE, CATHETERIZED 05/02/2021 0022   SPECREQUEST NONE 05/02/2021 0022   CULT  05/02/2021 0022    NO GROWTH Performed at [facility], [place] [address] [place], [place] [address]    REPTSTATUS 05/04/2021 FINAL 05/02/2021 0022    Cardiac Enzymes: No results for input(s): CKTOTAL, CKMB, CKMBINDEX, TROPONINI in the last 168 hours. CBG: Recent Labs  Lab 06/12/21 1637 06/12/21 2021 06/13/21 0145 06/13/21 0517 06/13/21 0843  GLUCAP 149* 241* 250* 274* 333*    Iron Studies: No results for input(s): IRON, TIBC, TRANSFERRIN, FERRITIN in the last 72 hours. @lablastinr3 @ Studies/Results: DG Abd Portable 1V  Result Date: 06/11/2021 CLINICAL DATA:  Feeding tube placement. EXAM: PORTABLE ABDOMEN - 1 VIEW COMPARISON:  IR fluoroscopy, 05/29/2021. CT abdomen pelvis, 05/27/2021. FINDINGS: Enteric feeding tube, with tip projected to the right of midline. The imaged bowel is not obstructed. Contrast within colon, likely ingested. No interval osseous abnormality. IMPRESSION: Transpyloric enteric feeding tube Electronically Signed   By: Michaelle Birks M.D.   On: 06/11/2021 10:53   Medications:  sodium chloride     feeding supplement (OSMOLITE 1.5 CAL) 1,000 mL (06/13/21 0505)    atorvastatin  40 mg Oral Daily   Chlorhexidine Gluconate Cloth  6 each Topical Q0600   collagenase   Topical Daily   darbepoetin (ARANESP) injection - DIALYSIS  100 mcg Intravenous Q Sat-HD   feeding supplement (PROSource TF)  45 mL Per Tube Daily   gabapentin  100 mg Oral QHS   insulin aspart  0-5 Units Subcutaneous QHS   insulin aspart  0-9 Units Subcutaneous TID WC   midodrine  10 mg Oral TID WC   mirtazapine  7.5 mg Oral QHS   multivitamin  1 tablet Oral QHS   pantoprazole  40 mg Oral Q0600   sodium chloride flush  3 mL Intravenous Q12H   thiamine  100 mg Oral Daily   venlafaxine XR  75 mg Oral Q breakfast     Dialysis Orders: AF TTS here 3h 44mn 400/500    62.5kg    3K/2.5 Ca    P2  AVG HeRO   Hep none - Mircera 50 q 2 wks  (7/13)  Venofer 50 q wk - Parsabiv 52mIV q HDlower extremity edema, left upper arm swelling, nontender dialysis Access: Left AVG positive bruit, with upper arm swelling   Assessment/Plan: AMS/FTT: Providers and palliative care met w/ family and discussed pt's decline. Due to declining mental and physical status we don't anticipate she will be able to do OP dialysis.  Pt will have to be able to sit in a chair for 4-6 hrs to qualify for OP HD w/ our  group. If she is able to sit for dialysis and cooperate, then the next step would to re-CLIP the patient as her OP HD unit will not take her back.  Trial of recliner dialysis this week. Cortrak per RD for nutrition support.  Melena/Rectal bleeding - GI consulted. Hx rectal ulcers.  EGD 7/27 normal / flex sig =mucosal ulceration c/w sterocoral ulcers, non bleeding.  Hgb stable 8.6 ESRD: HD TTS here (usual MWF outpatient).  Continue TTS for now pending dispo. Next HD 06/14/21 LUE edema: Had f'gram 6/23 which showed chronic thrombus and was started on Eliquis - currently on hold due to #1. Contracture distal to AVF Hypertension/volume  - BP soft. On midodrine for BP support. Edema improving, UF as tolerated.  Under edw if weights correct. Nutrition: Albumin low. Continue protein supps.  TF per RD once Cortrak in place.  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  Anemia  - Hgb 7.4 Aranesp 10055mIV q Saturday. Increase dose for 06/14/21 Dispo: DNR/DNI. Poor prognosis. Palliative care following to establish goals of care. Appreciate their involvement. We do not belive dialysis is adding to her quality of life. Per last note, family wishes to continue dialysis for now. At this point does not appear suitable for outpatient dialysis.   Amy [name] H. Marigene Erler NP-C 06/13/2021, 9:44 AM  [phone]

## 2021-06-13 NOTE — Progress Notes (Signed)
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  On presentation, hemoglobin was 9.6; GI was consulted.  She underwent EGD which was normal and flexible sigmoidoscopy which showed nonbleeding distal rectal ulcers/stercoral ulcers.  Subsequently, GI signed off. Neurology was also consulted for altered mental status; MRI brain was similar to prior MRI at [facility] with a large right MCA stroke.  Nephrology following for dialysis needs; nephrology is stating that patient will not be accepted back to outpatient dialysis at this time due to her underlying confusion, weakness and unsafe to sit in a dialysis recliner as an outpatient.  Due to her poor oral intake, IR was consulted for possible PEG tube placement but IR was unable to place because of overlying transverse colon.  General surgery was consulted for G-tube placement but family currently undecided.  Palliative care following for goals of care discussion.    Assessment & Plan:   Principal Problem:   GI bleed Active Problems:   Essential hypertension   Type 2 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, with long-term current use of insulin (HCC)   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   ESRD on dialysis ([place])   Dyslipidemia   Hypokalemia   Stage 2 skin ulcer of sacral region [facility])   Cerebral thrombosis with cerebral infarction   Protein-calorie malnutrition, severe   GI bleed  secondary to rectal ulcers: Patient presented with anemia,  underwent EGD which was normal and flex sigmoidoscopy with clean base, nonbleeding distal rectal ulcers (stercoral ulcers).  Gastroenterology now signed off on 05/22/21. Hemoglobin remains stable. / No further reports of bleeding during this hospitalization.   Okay to restart Eliquis per GI Eliquis was held again on 06/09/2021.   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 MRI brain findings were similar in appearance to her prior MRI at [facility] with no drastic changes which is notable for a large right MCA stroke with advanced microvascular ischemic changes of the white matter; MRA head degraded but did not show occlusion and bilateral carotid ultrasound revealed R ICA and LICA with [id number] stenosis.  EEG with cortical dysfunction right hemisphere likely secondary to underlying structural abnormality/stroke and moderate encephalopathy with no seizure activity or epileptiform discharges.  Hemoglobin A1c 5.1, LDL 68.  No further recommendations per neurology and signed off 05/28/2021. Continue atorvastatin 40 mg p.o. daily Diet as per SLP recommendations   Hyperlipidemia : Continue statin   Type 2 diabetes mellitus with episodes of hypoglycemia Hemoglobin A1c 5.1.  Diet controlled at baseline. Patient has had episodes of hypoglycemia due to poor oral intake.  She was started on D5 drip on 06/05/2021.  Monitor CBGs.   ESRD on HD As per nephrology , Patient is not a candidate for continued outpatient HD given her confusion, weakness and inability to sit up in a chair for HD outpatient.  Overall HD is not enhancing her quality of life at this time. Continue on HD while inpatient Per nephrology, Dr. Moshe Cipro , will  not be acceptted back to outpatient dialysis at this time due to her underlying confusion, weakness and unsafe to sit in a outpatient dialysis  recliner as she has been pulling at the HD needles prior. Nephrology is recommending consideration of transition to hospice.   As per palliative care discussion with family on 06/05/2021: Family wants to continue to treat the treatable with hopes for improvement and to continue hemodialysis as long as patient can tolerate. Patient will need either LTAC placement or another outpatient hemodialysis unit needs to be found.   Chronic thrombus left upper extremity. Eliquis held again on 06/09/2021 in case patient would undergo G-tube placement.   Cortrak placement by dietitian before we resume Eliquis .   Hypotension: Blood pressure currently is stable.  Continue midodrine   Stage II mid coccyx pressure injury, present on admission Continue wound care   Acute metabolic encephalopathy,  failure to thrive /Generalized deconditioning  /very poor oral intake Goals of care discussion. Neurology has already signed off.  Overall prognosis is very poor. Due to her poor oral intake, IR was consulted for PEG placement; unfortunately unable to place on 8/4 due to overlying transverse colon.   General surgery was consulted on 8/5 for surgical G-tube placement but family was not decided at that time. If condition does not improve, recommend total comfort measures. Oral remains very poor General surgery was reconsulted on 06/09/2021 for possible G-tube placement: After general surgery discussion with family, family has agreed with cortrak placement and initiation of feeding through the tube.  Dietitian has been reconsulted. Patient underwent cortrek placement on 2/16.  cortrek tube was clogged, removed and new tube was inserted.   Hyponatremia. Being managed by nephrology by dialysis.   Urinary tract infection Completed 3-day course of ceftriaxone   Hepatic hypodensity Incidental finding of an ovoid hepatic hypodensity anterior aspect of liver measuring up to 4.9 cm, could represent mass versus focal fatty  infiltration.   Will need outpatient follow-up with MRI liver for further evaluation if goals of care remain aggressive.   Leukocytosis Resolved.  GERD Continue PPI     DVT prophylaxis: Eliquis Held. Code Status: DNR Family Communication: No family at bed side. Disposition Plan:    Status is: Inpatient  Remains inpatient appropriate because:Inpatient level of care appropriate due to severity of illness  Dispo: The patient is from: SNF              Anticipated d/c is to: SNF              Patient currently is not medically stable to d/c.   Difficult to place patient No  Consultants:  Neurology, palliative care, general surgery, IR, GI  Procedures: EEG 8/1: Suggestive of cortical dysfunction right hemisphere likely secondary to underlying structural abnormality/stroke, moderate encephalopathy, no seizure activity or epileptiform discharges. EGD 7/27, normal exam Flex sigmoidoscopy 7/27: 4 nonbleeding ulcers  Antimicrobials:   Anti-infectives (From admission, onward)    Start     Dose/Rate Route Frequency Ordered Stop   05/29/21 1622  ceFAZolin (ANCEF) 2-4 GM/100ML-% IVPB       Note to Pharmacy: Lytle Butte   : cabinet override      05/29/21 1622 05/30/21 0429   05/29/21 0000  cefTRIAXone (ROCEPHIN) 1 g in sodium chloride 0.9 % 100 mL IVPB  Status:  Discontinued        1 g 200 mL/hr over 30 Minutes Intravenous Every 24 hours 05/28/21 1801 05/28/21 1801   05/28/21 1830  cefTRIAXone (  ROCEPHIN) 1 g in sodium chloride 0.9 % 100 mL IVPB        1 g 200 mL/hr over 30 Minutes Intravenous Every 24 hours 05/28/21 1801 05/30/21 1907   05/28/21 1630  ceFAZolin (ANCEF) IVPB 2g/100 mL premix        2 g 200 mL/hr over 30 Minutes Intravenous To Radiology 05/28/21 1537 05/29/21 1630        Subjective: Patient was seen and examined at bedside.  Overnight events noted. Patient is alert, arousable, following partial commands. Appears chronically ill looking and very  deconditioned.  Objective: Vitals:   06/12/21 1647 06/12/21 1725 06/13/21 0142 06/13/21 0505  BP: (!) 106/56 (!) 104/57 (!) 130/41 (!) 143/40  Pulse: (!) 116 (!) 118 (!) 105 99  Resp: '20 19 14 19  '$ Temp: 98.9 F (37.2 C) 98.9 F (37.2 C) 100.1 F (37.8 C)   TempSrc: Oral Oral Oral   SpO2: 96% 100% 100% 100%  Weight:      Height:        Intake/Output Summary (Last 24 hours) at 06/13/2021 1424 Last data filed at 06/12/2021 2031 Gross per 24 hour  Intake 539 ml  Output 0 ml  Net 539 ml    Filed Weights   06/08/21 0538 06/09/21 0611 06/11/21 0446  Weight: 57 kg 56.9 kg 60 kg    Examination:  General exam: Chronically ill looking and deconditioned, lying comfortably.  Cortrek noted. Respiratory system: Decreased breath sounds bilaterally.  RR 15 Cardiovascular system: S1-S2 normal, regular rate and rhythm, no murmur Gastrointestinal system: Abdomen is soft, mildly distended, nontender, bowel sounds positive  Central nervous system: Alert, arousable, following commands, left-sided weakness present. Extremities: Left arm swelling improving. Skin: No rashes, lesions or ulcers Psychiatry: Mood and affect appropriate.    Data Reviewed: I have personally reviewed following labs and imaging studies  CBC: Recent Labs  Lab 06/09/21 0828 06/11/21 0537 06/12/21 0640  WBC 8.6 7.6  --   NEUTROABS 6.0 5.4  --   HGB 8.6* 7.4* 7.4*  HCT 26.5* 23.1* 22.5*  MCV 94.0 94.3  --   PLT 210 191  --     Basic Metabolic Panel: Recent Labs  Lab 06/09/21 0828 06/11/21 0537 06/11/21 1653 06/12/21 0640  NA 125* 128*  --  124*  K 3.6 4.2  --  3.8  CL 88* 92*  --  89*  CO2 28 27  --  27  GLUCOSE 88 131*  --  205*  BUN 12 7*  --  9  CREATININE 4.76* 3.69*  --  4.43*  CALCIUM 7.7* 8.2*  --  7.8*  MG  --  1.7 1.7 1.7  PHOS  --  2.5 2.5 2.2*    GFR: Estimated Creatinine Clearance: 10.7 mL/min (A) (by C-G formula based on SCr of 4.43 mg/dL (H)). Liver Function Tests: No results  for input(s): AST, ALT, ALKPHOS, BILITOT, PROT, ALBUMIN in the last 168 hours. No results for input(s): LIPASE, AMYLASE in the last 168 hours. No results for input(s): AMMONIA in the last 168 hours. Coagulation Profile: No results for input(s): INR, PROTIME in the last 168 hours. Cardiac Enzymes: No results for input(s): CKTOTAL, CKMB, CKMBINDEX, TROPONINI in the last 168 hours. BNP (last 3 results) No results for input(s): PROBNP in the last 8760 hours. HbA1C: No results for input(s): HGBA1C in the last 72 hours. CBG: Recent Labs  Lab 06/12/21 2021 06/13/21 0145 06/13/21 0517 06/13/21 0843 06/13/21 1253  GLUCAP 241* 250* 274* 333*  141*    Lipid Profile: No results for input(s): CHOL, HDL, LDLCALC, TRIG, CHOLHDL, LDLDIRECT in the last 72 hours. Thyroid Function Tests: No results for input(s): TSH, T4TOTAL, FREET4, T3FREE, THYROIDAB in the last 72 hours. Anemia Panel: No results for input(s): VITAMINB12, FOLATE, FERRITIN, TIBC, IRON, RETICCTPCT in the last 72 hours. Sepsis Labs: No results for input(s): PROCALCITON, LATICACIDVEN in the last 168 hours.  No results found for this or any previous visit (from the past 240 hour(s)).   Radiology Studies: DG Abd Portable 1V  Result Date: 06/13/2021 CLINICAL DATA:  Feeding tube placement EXAM: PORTABLE ABDOMEN - 1 VIEW COMPARISON:  None. FINDINGS: Feeding tube with the tip projecting over the antrum of the stomach. Mild gaseous distension of the small bowel and colon. No bowel dilatation to suggest obstruction. No evidence of pneumoperitoneum, portal venous gas or pneumatosis. No pathologic calcifications along the expected course of the ureters. Elevation of the right diaphragm. No acute osseous abnormality. IMPRESSION: Feeding tube with the tip projecting over the antrum of the stomach. Electronically Signed   By: Kathreen Devoid M.D.   On: 06/13/2021 12:01     Scheduled Meds:  atorvastatin  40 mg Oral Daily   Chlorhexidine Gluconate  Cloth  6 each Topical Q0600   collagenase   Topical Daily   [START ON 06/14/2021] darbepoetin (ARANESP) injection - DIALYSIS  150 mcg Intravenous Q Sat-HD   feeding supplement (PROSource TF)  45 mL Per Tube Daily   gabapentin  100 mg Oral QHS   insulin aspart  0-5 Units Subcutaneous QHS   insulin aspart  0-9 Units Subcutaneous TID WC   midodrine  10 mg Oral TID WC   mirtazapine  7.5 mg Oral QHS   multivitamin  1 tablet Oral QHS   [START ON 06/14/2021] pantoprazole sodium  40 mg Oral Daily   sodium chloride flush  3 mL Intravenous Q12H   thiamine  100 mg Oral Daily   venlafaxine  37.5 mg Per Tube BID   Continuous Infusions:  sodium chloride     feeding supplement (OSMOLITE 1.5 CAL) 1,000 mL (06/13/21 0505)     LOS: 24 days    Time spent: 25 mins    Wayne Wicklund, MD [facility]   If 7PM-7AM, please contact night-coverage

## 2021-06-13 NOTE — Plan of Care (Signed)
  Problem: Activity: Goal: Risk for activity intolerance will decrease Outcome: Not Progressing   Problem: Nutrition: Goal: Adequate nutrition will be maintained Outcome: Not Progressing   

## 2021-06-13 NOTE — Progress Notes (Signed)
Palliative:  Chart reviewed per Dr. Ronnie Derby request for palliative re-engagement.  While she is not assigned to my patient load today, I was involved with NP Wadie Lessen during a goals of care discussion on 06/02/20 with Dr. Melvia Heaps, patient's son [name], Dr. [place] and speech therapist Tobie Poet. I hope to offer some clarity as to palliative's role in [name] care at this time.  As per NP Larach's note on 8/8, the discussion with the patient's son [name] again reiterated that the patient has a complicated medical situation and a poor long-term prognosis. At that time the patient's son said he understood all options and that he would take time to think through decisions moving forward for his mother. PMT's contact information was given to [name] again during this discussion.  Palliative Medicine Team has been monitoring the patient peripherally since this family meeting as evidenced by NP Larach's note on 8/11, which reflects her discussion with the patient's support person [name].    Goals are clear - full scope/full treatment with code status to remain DNR/DNI. The conclusion of both discussions has stayed the same - the family wants to continue HD as long as she can tolerate it and they want to receive a second opinion from [facility] regarding treatment options for Ms. Coia.  PMT has left contact information for our office and made it known to both the patient's son [name] and support person [name] that we are available any time to speak with them should they feel our services are needed.

## 2021-06-13 NOTE — Procedures (Addendum)
Cortrak  Person Inserting Tube:  Maylon Peppers C, RD Tube Type:  Cortrak - 43 inches Tube Size:  10 Tube Location:  Left nare Initial Placement:  Stomach Secured by: Bridle Technique Used to Measure Tube Placement:  Marking at nare/corner of mouth Cortrak Secured At:  68 cm  Cortrak Tube Team Note:  Called by RN about cortrak tube being clogged. Attempted to flush cortrak tube and reinsert stylet however tube remained clogged. Consult for cortrak tube replacement ordered. Cortrak tube placed and documented.    X-ray is required, abdominal x-ray has been ordered by the Cortrak team. Please confirm tube placement before using the Cortrak tube.   If the tube becomes dislodged please keep the tube and contact the Cortrak team at [email or web address] (password [email or web address]) for replacement.  If after hours and replacement cannot be delayed, place a NG tube and confirm placement with an abdominal x-ray.   Lockie Pares., RD, LDN, CNSC See AMiON for contact information

## 2021-06-13 NOTE — Progress Notes (Signed)
TF increased to 64m/hr

## 2021-06-14 DIAGNOSIS — K922 Gastrointestinal hemorrhage, unspecified: Secondary | ICD-10-CM | POA: Diagnosis not present

## 2021-06-14 LAB — CBC
HCT: 22.4 % — ABNORMAL LOW (ref 36.0–46.0)
Hemoglobin: 7.1 g/dL — ABNORMAL LOW (ref 12.0–15.0)
MCH: 30.2 pg (ref 26.0–34.0)
MCHC: 31.7 g/dL (ref 30.0–36.0)
MCV: 95.3 fL (ref 80.0–100.0)
Platelets: 152 10*3/uL (ref 150–400)
RBC: 2.35 MIL/uL — ABNORMAL LOW (ref 3.87–5.11)
RDW: 18.2 % — ABNORMAL HIGH (ref 11.5–15.5)
WBC: 13.7 10*3/uL — ABNORMAL HIGH (ref 4.0–10.5)
nRBC: 0 % (ref 0.0–0.2)

## 2021-06-14 LAB — RENAL FUNCTION PANEL
Albumin: 1.9 g/dL — ABNORMAL LOW (ref 3.5–5.0)
Anion gap: 7 (ref 5–15)
BUN: 17 mg/dL (ref 8–23)
CO2: 28 mmol/L (ref 22–32)
Calcium: 7.5 mg/dL — ABNORMAL LOW (ref 8.9–10.3)
Chloride: 96 mmol/L — ABNORMAL LOW (ref 98–111)
Creatinine, Ser: 4.13 mg/dL — ABNORMAL HIGH (ref 0.44–1.00)
GFR, Estimated: 11 mL/min — ABNORMAL LOW (ref >60)
Glucose, Bld: 270 mg/dL — ABNORMAL HIGH (ref 70–99)
Phosphorus: 1.1 mg/dL — ABNORMAL LOW (ref 2.5–4.6)
Potassium: 4.6 mmol/L (ref 3.5–5.1)
Sodium: 131 mmol/L — ABNORMAL LOW (ref 135–145)

## 2021-06-14 LAB — GLUCOSE, CAPILLARY
Glucose-Capillary: 208 mg/dL — ABNORMAL HIGH (ref 70–99)
Glucose-Capillary: 184 mg/dL — ABNORMAL HIGH (ref 70–99)
Glucose-Capillary: 240 mg/dL — ABNORMAL HIGH (ref 70–99)
Glucose-Capillary: 209 mg/dL — ABNORMAL HIGH (ref 70–99)
Glucose-Capillary: 192 mg/dL — ABNORMAL HIGH (ref 70–99)

## 2021-06-14 MED ORDER — APIXABAN 2.5 MG PO TABS
2.5000 mg | ORAL_TABLET | Freq: Two times a day (BID) | ORAL | Status: DC
  Administered 2021-06-14: 2.5 mg via ORAL
  Filled 2021-06-14: qty 1

## 2021-06-14 MED ORDER — LIDOCAINE-PRILOCAINE 2.5-2.5 % EX CREA: 1.0000 "application " | TOPICAL_CREAM | CUTANEOUS | Status: DC | PRN

## 2021-06-14 MED ORDER — PENTAFLUOROPROP-TETRAFLUOROETH EX AERO: 1.0000 "application " | INHALATION_SPRAY | CUTANEOUS | Status: DC | PRN

## 2021-06-14 MED ORDER — SODIUM CHLORIDE 0.9 % IV SOLN: 100.0000 mL | INTRAVENOUS | Status: DC | PRN

## 2021-06-14 MED ORDER — LIDOCAINE HCL (PF) 1 % IJ SOLN: 5.0000 mL | INTRAMUSCULAR | Status: DC | PRN

## 2021-06-14 NOTE — Progress Notes (Signed)
[facility] Progress Note   Subjective:  Seen in room. Repeating questions back to me but doesn't answer them. HD today on schedule.    Objective Vitals:   06/13/21 1716 06/13/21 2143 06/14/21 0508 06/14/21 0939  BP: 120/62 119/88 (!) 151/65 98/66  Pulse: 96 (!) 107 100 99  Resp: _0 Temp: 97.6 F (36.4 C) 98.4 F (36.9 C) 99 F (37.2 C) 98.6 F (37 C)  TempSrc: Axillary Oral Oral   SpO2: 100% 100% 100%   Weight:      Height:       Physical Exam General: Chronically appearing ill female in NAD Heart: S1,S2 RRR No M/R/G Lungs: CTAB. No WOB.  Abdomen: NABS Extremities: No LE edema  Dialysis Access: L AVG + T/B  Additional Objective Labs: Basic Metabolic Panel: Recent Labs  Lab 06/09/21 0828 06/11/21 0537 06/11/21 1653 06/12/21 0640  NA 125* 128*  --  124*  K 3.6 4.2  --  3.8  CL 88* 92*  --  89*  CO2 28 27  --  27  GLUCOSE 88 131*  --  205*  BUN 12 7*  --  9  CREATININE 4.76* 3.69*  --  4.43*  CALCIUM 7.7* 8.2*  --  7.8*  PHOS  --  2.5 2.5 2.2*   Liver Function Tests: No results for input(s): AST, ALT, ALKPHOS, BILITOT, PROT, ALBUMIN in the last 168 hours. No results for input(s): LIPASE, AMYLASE in the last 168 hours. CBC: Recent Labs  Lab 06/09/21 0828 06/11/21 0537 06/12/21 0640  WBC 8.6 7.6  --   NEUTROABS 6.0 5.4  --   HGB 8.6* 7.4* 7.4*  HCT 26.5* 23.1* 22.5*  MCV 94.0 94.3  --   PLT 210 191  --    Blood Culture    Component Value Date/Time   SDES URINE, CATHETERIZED 05/02/2021 0022   SPECREQUEST NONE 05/02/2021 0022   CULT  05/02/2021 0022    NO GROWTH Performed at [facility], [place] [address], [place], [place] [address]    REPTSTATUS 05/04/2021 FINAL 05/02/2021 0022    Cardiac Enzymes: No results for input(s): CKTOTAL, CKMB, CKMBINDEX, TROPONINI in the last 168 hours. CBG: Recent Labs  Lab 06/13/21 1253 06/13/21 1704 06/13/21 2030 06/14/21 0501 06/14/21 0824  GLUCAP 141* 165* 158* 208* 184*    Iron Studies: No results for input(s): IRON, TIBC, TRANSFERRIN, FERRITIN in the last 72 hours. _1 @ Studies/Results: DG Abd Portable 1V  Result Date: 06/13/2021 CLINICAL DATA:  Feeding tube placement EXAM: PORTABLE ABDOMEN - 1 VIEW COMPARISON:  None. FINDINGS: Feeding tube with the tip projecting over the antrum of the stomach. Mild gaseous distension of the small bowel and colon. No bowel dilatation to suggest obstruction. No evidence of pneumoperitoneum, portal venous gas or pneumatosis. No pathologic calcifications along the expected course of the ureters. Elevation of the right diaphragm. No acute osseous abnormality. IMPRESSION: Feeding tube with the tip projecting over the antrum of the stomach. Electronically Signed   By: Kathreen Devoid M.D.   On: 06/13/2021 12:01   Medications:  sodium chloride     feeding supplement (OSMOLITE 1.5 CAL) 1,000 mL (06/13/21 0505)    atorvastatin  40 mg Oral Daily   Chlorhexidine Gluconate Cloth  6 each Topical Q0600   collagenase   Topical Daily   darbepoetin (ARANESP) injection - DIALYSIS  150 mcg Intravenous Q Sat-HD   feeding supplement (PROSource TF)  45 mL Per Tube Daily  gabapentin  100 mg Oral QHS   insulin aspart  0-5 Units Subcutaneous QHS   insulin aspart  0-9 Units Subcutaneous TID WC   midodrine  10 mg Oral TID WC   mirtazapine  7.5 mg Oral QHS   multivitamin  1 tablet Oral QHS   pantoprazole sodium  40 mg Oral Daily   sodium chloride flush  3 mL Intravenous Q12H   thiamine  100 mg Oral Daily   venlafaxine  37.5 mg Per Tube BID     Dialysis Orders: AF TTS here 3h 37mn 400/500    62.5kg    3K/2.5 Ca    P2  AVG HeRO   Hep none - Mircera 50 q 2 wks  (7/13)  Venofer 50 q wk - Parsabiv 555mIV q HDlower extremity edema, left upper arm swelling, nontender dialysis Access: Left AVG positive bruit, with upper arm swelling   Assessment/Plan: AMS/FTT: Providers and palliative care met w/ family and discussed pt's decline. Due to  declining mental and physical status we don't anticipate she will be able to do OP dialysis.  Pt will have to be able to sit in a chair for 4-6 hrs to qualify for OP HD w/ our group. If she is able to sit for dialysis and cooperate, then the next step would to re-CLIP the patient as her OP HD unit will not take her back.  Trial of recliner dialysis this week. Cortrak per RD for nutrition support.  Melena/Rectal bleeding - GI consulted. Hx rectal ulcers.  EGD 7/27 normal / flex sig =mucosal ulceration c/w sterocoral ulcers, non bleeding.  Hgb stable 8.6 ESRD: HD TTS here (usual MWF outpatient).  Continue TTS for now pending dispo. Next HD 06/14/21 LUE edema: Had f'gram 6/23 which showed chronic thrombus and was started on Eliquis - currently on hold due to #1. Contracture distal to AVF Hypertension/volume  - BP soft. On midodrine for BP support. Edema improving, UF as tolerated.  Under edw if weights correct. Nutrition: Albumin low. Continue protein supps.  TF per RD once Cortrak in place.  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  Anemia  - Hgb 7.4 Aranesp 10073mIV q Saturday. Increase dose for 06/14/21 Dispo: DNR/DNI. Poor prognosis. Palliative care following to establish goals of care. Appreciate their involvement. We do not belive dialysis is adding to her quality of life. Per last note, family wishes to continue dialysis for now. At this point does not appear suitable for outpatient dialysis.   Kassy Mcenroe H. Charlotta Lapaglia NP-C 06/14/2021, 10:21 AM  [phone]

## 2021-06-14 NOTE — Progress Notes (Signed)
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  On presentation, hemoglobin was 9.6; GI was consulted.  She underwent EGD which was normal and flexible sigmoidoscopy which showed nonbleeding distal rectal ulcers/stercoral ulcers.  Subsequently, GI signed off. Neurology was also consulted for altered mental status; MRI brain was similar to prior MRI at [facility] with a large right MCA stroke.  Nephrology following for dialysis needs; nephrology is stating that patient will not be accepted back to outpatient dialysis at this time due to her underlying confusion, weakness and unsafe to sit in a dialysis recliner as an outpatient.  Due to her poor oral intake, IR was consulted for possible PEG tube placement but IR was unable to place because of overlying transverse colon.  General surgery was consulted for G-tube placement but family currently undecided.  Palliative care following for goals of care discussion.    Assessment & Plan:   Principal Problem:   GI bleed Active Problems:   Essential hypertension   Type 2 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, with long-term current use of insulin (HCC)   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   ESRD on dialysis ([place])   Dyslipidemia   Hypokalemia   Stage 2 skin ulcer of sacral region [facility])   Cerebral thrombosis with cerebral infarction   Protein-calorie malnutrition, severe   GI bleed  secondary to rectal ulcers: Patient presented with anemia,  underwent EGD which was normal and flex sigmoidoscopy with clean base, nonbleeding distal rectal ulcers (stercoral ulcers).  Gastroenterology now signed off on 05/22/21. Hemoglobin remains stable. / No further reports of bleeding during this hospitalization.   Okay to restart Eliquis per GI Eliquis was held again on 06/09/2021.   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 MRI brain findings were similar in appearance to her prior MRI at [facility] with no drastic changes which is notable for a large right MCA stroke with advanced microvascular ischemic changes of the white matter; MRA head degraded but did not show occlusion and bilateral carotid ultrasound revealed R ICA and LICA with [id number] stenosis.  EEG with cortical dysfunction right hemisphere likely secondary to underlying structural abnormality/stroke and moderate encephalopathy with no seizure activity or epileptiform discharges.  Hemoglobin A1c 5.1, LDL 68.  No further recommendations per neurology and signed off 05/28/2021. Continue atorvastatin 40 mg p.o. daily Diet as per SLP recommendations   Hyperlipidemia : Continue statin   Type 2 diabetes mellitus with episodes of hypoglycemia Hemoglobin A1c 5.1.  Diet controlled at baseline. Patient has had episodes of hypoglycemia due to poor oral intake.  She was started on D5 drip on 06/05/2021.  Monitor CBGs.   ESRD on HD Per Nephro Patient is not a candidate for continued outpatient HD given her confusion, weakness and inability to sit up in a chair for HD outpatient.  Overall HD is not enhancing her quality of life at this time. Continue on HD while inpatient. At this point she doesn't appear suitable for  outpatient dialysis. Nephrology is recommending consideration of transition to hospice.   As per palliative care discussion with family on  06/05/2021: Family wants to continue to treat the treatable with hopes for improvement and to continue hemodialysis as long as patient can tolerate. Patient will need either LTAC placement or another outpatient hemodialysis unit needs to be found.   Chronic thrombus left upper extremity. Eliquis held again on 06/09/2021 in case patient would undergo G-tube placement.   Cortrak placement by dietitian before we resume Eliquis .   Hypotension: Blood pressure currently is stable.  Continue midodrine   Stage II mid coccyx pressure injury, present on admission Continue wound care   Acute metabolic encephalopathy,  failure to thrive /Generalized deconditioning  /very poor oral intake Goals of care discussion. Neurology has already signed off.  Overall prognosis is very poor. Due to her poor oral intake, IR was consulted for PEG placement; unfortunately unable to place on 8/4 due to overlying transverse colon.   General surgery was consulted on 8/5 for surgical G-tube placement but family was not decided at that time. If condition does not improve, recommend total comfort measures. Oral intake  remains very poor General surgery was reconsulted on 06/09/2021 for possible G-tube placement: After general surgery discussion with family, family has agreed with cortrak placement and initiation of feeding through the tube.  Dietitian has been reconsulted. Patient underwent cortrek placement on 2/16.  cortrek tube was clogged, removed and new tube was inserted.   Hyponatremia. Being managed by nephrology by dialysis.   Urinary tract infection Completed 3-day course of ceftriaxone   Hepatic hypodensity Incidental finding of an ovoid hepatic hypodensity anterior aspect of liver measuring up to 4.9 cm, could represent mass versus focal fatty infiltration.   Will need outpatient follow-up with MRI liver for further evaluation if goals of care remain aggressive.   Leukocytosis Resolved.  GERD Continue  PPI     DVT prophylaxis: Eliquis Held. Code Status: DNR Family Communication: No family at bed side. Disposition Plan:    Status is: Inpatient  Remains inpatient appropriate because:Inpatient level of care appropriate due to severity of illness  Dispo: The patient is from: SNF              Anticipated d/c is to: SNF              Patient currently is not medically stable to d/c.   Difficult to place patient No  Consultants:  Neurology, palliative care, general surgery, IR, GI  Procedures: EEG 8/1: Suggestive of cortical dysfunction right hemisphere likely secondary to underlying structural abnormality/stroke, moderate encephalopathy, no seizure activity or epileptiform discharges. EGD 7/27, normal exam Flex sigmoidoscopy 7/27: 4 nonbleeding ulcers  Antimicrobials:   Anti-infectives (From admission, onward)    Start     Dose/Rate Route Frequency Ordered Stop   05/29/21 1622  ceFAZolin (ANCEF) 2-4 GM/100ML-% IVPB       Note to Pharmacy: L[place]le [place]   : cabinet override      05/29/21 1622 05/30/21 0429   05/29/21 0000  cefTRIAXone (ROCEPHIN) 1 g in sodium chloride 0.9 % 100 mL IVPB  Status:  Discontinued        1 g 200 mL/hr over 30 Minutes Intravenous Every 24 hours 05/28/21 1801 05/28/21 1801   05/28/21 1830  cefTRIAXone (ROCEPHIN) 1 g in sodium chloride 0.9 % 100 mL IVPB        1 g 200 mL/hr over 30 Minutes Intravenous Every 24 hours 05/28/21 1801  05/30/21 1907   05/28/21 1630  ceFAZolin (ANCEF) IVPB 2g/100 mL premix        2 g 200 mL/hr over 30 Minutes Intravenous To Radiology 05/28/21 1537 05/29/21 1630        Subjective: Patient was seen and examined at bedside.  Overnight events noted. Patient is partially arousable, does not follow any commands.  She is cortrek for nutritional support. Appears chronically ill looking and very deconditioned.  Objective: Vitals:   06/13/21 1716 06/13/21 2143 06/14/21 0508 06/14/21 0939  BP: 120/62 119/88 (!) 151/65 98/66   Pulse: 96 (!) 107 100 99  Resp: '18 18 16 18  '$ Temp: 97.6 F (36.4 C) 98.4 F (36.9 C) 99 F (37.2 C) 98.6 F (37 C)  TempSrc: Axillary Oral Oral   SpO2: 100% 100% 100%   Weight:      Height:        Intake/Output Summary (Last 24 hours) at 06/14/2021 1321 Last data filed at 06/14/2021 0900 Gross per 24 hour  Intake 542 ml  Output 0 ml  Net 542 ml   Filed Weights   06/08/21 0538 06/09/21 0611 06/11/21 0446  Weight: 57 kg 56.9 kg 60 kg    Examination:  General exam: Chronically ill looking and deconditioned, lying comfortably.  Cortrek noted. Respiratory system: Decreased breath sounds bilaterally.  RR 15 Cardiovascular system: S1-S2 normal, regular rate and rhythm, no murmur Gastrointestinal system: Abdomen is soft, mildly distended, nontender, bowel sounds positive  Central nervous system: Alert, partially arousable, left-sided weakness present. Extremities: Left arm swelling improving. Skin: No rashes, lesions or ulcers Psychiatry: Mood and affect appropriate.    Data Reviewed: I have personally reviewed following labs and imaging studies  CBC: Recent Labs  Lab 06/09/21 0828 06/11/21 0537 06/12/21 0640 06/14/21 1136  WBC 8.6 7.6  --  13.7*  NEUTROABS 6.0 5.4  --   --   HGB 8.6* 7.4* 7.4* 7.1*  HCT 26.5* 23.1* 22.5* 22.4*  MCV 94.0 94.3  --  95.3  PLT 210 191  --  0000000   Basic Metabolic Panel: Recent Labs  Lab 06/09/21 0828 06/11/21 0537 06/11/21 1653 06/12/21 0640 06/14/21 1136  NA 125* 128*  --  124* 131*  K 3.6 4.2  --  3.8 4.6  CL 88* 92*  --  89* 96*  CO2 28 27  --  27 28  GLUCOSE 88 131*  --  205* 270*  BUN 12 7*  --  9 17  CREATININE 4.76* 3.69*  --  4.43* 4.13*  CALCIUM 7.7* 8.2*  --  7.8* 7.5*  MG  --  1.7 1.7 1.7  --   PHOS  --  2.5 2.5 2.2* 1.1*   GFR: Estimated Creatinine Clearance: 11.5 mL/min (A) (by C-G formula based on SCr of 4.13 mg/dL (H)). Liver Function Tests: Recent Labs  Lab 06/14/21 1136  ALBUMIN 1.9*   No results  for input(s): LIPASE, AMYLASE in the last 168 hours. No results for input(s): AMMONIA in the last 168 hours. Coagulation Profile: No results for input(s): INR, PROTIME in the last 168 hours. Cardiac Enzymes: No results for input(s): CKTOTAL, CKMB, CKMBINDEX, TROPONINI in the last 168 hours. BNP (last 3 results) No results for input(s): PROBNP in the last 8760 hours. HbA1C: No results for input(s): HGBA1C in the last 72 hours. CBG: Recent Labs  Lab 06/13/21 1704 06/13/21 2030 06/14/21 0501 06/14/21 0824 06/14/21 1141  GLUCAP 165* 158* 208* 184* 240*   Lipid Profile: No results for  input(s): CHOL, HDL, LDLCALC, TRIG, CHOLHDL, LDLDIRECT in the last 72 hours. Thyroid Function Tests: No results for input(s): TSH, T4TOTAL, FREET4, T3FREE, THYROIDAB in the last 72 hours. Anemia Panel: No results for input(s): VITAMINB12, FOLATE, FERRITIN, TIBC, IRON, RETICCTPCT in the last 72 hours. Sepsis Labs: No results for input(s): PROCALCITON, LATICACIDVEN in the last 168 hours.  No results found for this or any previous visit (from the past 240 hour(s)).   Radiology Studies: DG Abd Portable 1V  Result Date: 06/13/2021 CLINICAL DATA:  Feeding tube placement EXAM: PORTABLE ABDOMEN - 1 VIEW COMPARISON:  None. FINDINGS: Feeding tube with the tip projecting over the antrum of the stomach. Mild gaseous distension of the small bowel and colon. No bowel dilatation to suggest obstruction. No evidence of pneumoperitoneum, portal venous gas or pneumatosis. No pathologic calcifications along the expected course of the ureters. Elevation of the right diaphragm. No acute osseous abnormality. IMPRESSION: Feeding tube with the tip projecting over the antrum of the stomach. Electronically Signed   By: Kathreen Devoid M.D.   On: 06/13/2021 12:01     Scheduled Meds:  atorvastatin  40 mg Oral Daily   Chlorhexidine Gluconate Cloth  6 each Topical Q0600   collagenase   Topical Daily   darbepoetin (ARANESP) injection  - DIALYSIS  150 mcg Intravenous Q Sat-HD   feeding supplement (PROSource TF)  45 mL Per Tube Daily   gabapentin  100 mg Oral QHS   insulin aspart  0-5 Units Subcutaneous QHS   insulin aspart  0-9 Units Subcutaneous TID WC   midodrine  10 mg Oral TID WC   mirtazapine  7.5 mg Oral QHS   multivitamin  1 tablet Oral QHS   pantoprazole sodium  40 mg Oral Daily   sodium chloride flush  3 mL Intravenous Q12H   thiamine  100 mg Oral Daily   venlafaxine  37.5 mg Per Tube BID   Continuous Infusions:  sodium chloride     sodium chloride     sodium chloride     feeding supplement (OSMOLITE 1.5 CAL) 1,000 mL (06/13/21 0505)     LOS: 25 days    Time spent: 25 mins    Ameliarose Shark, MD [facility]   If 7PM-7AM, please contact night-coverage

## 2021-06-15 DIAGNOSIS — K922 Gastrointestinal hemorrhage, unspecified: Secondary | ICD-10-CM | POA: Diagnosis not present

## 2021-06-15 LAB — BASIC METABOLIC PANEL
Anion gap: 6 (ref 5–15)
BUN: 23 mg/dL (ref 8–23)
CO2: 30 mmol/L (ref 22–32)
Calcium: 7.7 mg/dL — ABNORMAL LOW (ref 8.9–10.3)
Chloride: 96 mmol/L — ABNORMAL LOW (ref 98–111)
Creatinine, Ser: 4.54 mg/dL — ABNORMAL HIGH (ref 0.44–1.00)
GFR, Estimated: 10 mL/min — ABNORMAL LOW (ref >60)
Glucose, Bld: 245 mg/dL — ABNORMAL HIGH (ref 70–99)
Potassium: 5.5 mmol/L — ABNORMAL HIGH (ref 3.5–5.1)
Sodium: 132 mmol/L — ABNORMAL LOW (ref 135–145)

## 2021-06-15 LAB — GLUCOSE, CAPILLARY
Glucose-Capillary: 249 mg/dL — ABNORMAL HIGH (ref 70–99)
Glucose-Capillary: 225 mg/dL — ABNORMAL HIGH (ref 70–99)
Glucose-Capillary: 225 mg/dL — ABNORMAL HIGH (ref 70–99)
Glucose-Capillary: 153 mg/dL — ABNORMAL HIGH (ref 70–99)

## 2021-06-15 LAB — HEMOGLOBIN AND HEMATOCRIT, BLOOD
HCT: 22.9 % — ABNORMAL LOW (ref 36.0–46.0)
Hemoglobin: 7.1 g/dL — ABNORMAL LOW (ref 12.0–15.0)

## 2021-06-15 LAB — POTASSIUM: Potassium: 5.5 mmol/L — ABNORMAL HIGH (ref 3.5–5.1)

## 2021-06-15 MED ORDER — APIXABAN 2.5 MG PO TABS
2.5000 mg | ORAL_TABLET | Freq: Two times a day (BID) | ORAL | Status: DC
  Administered 2021-06-15 – 2021-06-30 (×24): 2.5 mg
  Filled 2021-06-15 (×25): qty 1

## 2021-06-15 MED ORDER — MIDODRINE HCL 5 MG PO TABS
10.0000 mg | ORAL_TABLET | Freq: Three times a day (TID) | ORAL | Status: DC
  Administered 2021-06-15 – 2021-07-21 (×88): 10 mg
  Filled 2021-06-15 (×94): qty 2

## 2021-06-15 MED ORDER — MIRTAZAPINE 15 MG PO TABS
7.5000 mg | ORAL_TABLET | Freq: Every day | ORAL | Status: DC
  Administered 2021-06-15 – 2021-07-20 (×34): 7.5 mg
  Filled 2021-06-15 (×6): qty 1
  Filled 2021-06-15: qty 0.5
  Filled 2021-06-15 (×26): qty 1

## 2021-06-15 MED ORDER — PANTOPRAZOLE SODIUM 40 MG PO PACK
40.0000 mg | PACK | Freq: Every day | ORAL | Status: DC
  Administered 2021-06-15 – 2021-07-10 (×20): 40 mg
  Filled 2021-06-15 (×22): qty 20

## 2021-06-15 MED ORDER — SODIUM ZIRCONIUM CYCLOSILICATE 10 G PO PACK
10.0000 g | PACK | Freq: Every day | ORAL | Status: DC
  Administered 2021-06-15 – 2021-06-17 (×3): 10 g
  Filled 2021-06-15 (×3): qty 1

## 2021-06-15 MED ORDER — SODIUM ZIRCONIUM CYCLOSILICATE 10 G PO PACK: 10.0000 g | PACK | Freq: Every day | ORAL | Status: DC

## 2021-06-15 MED ORDER — THIAMINE HCL 100 MG PO TABS
100.0000 mg | ORAL_TABLET | Freq: Every day | ORAL | Status: DC
  Administered 2021-06-15 – 2021-07-20 (×32): 100 mg
  Filled 2021-06-15 (×32): qty 1

## 2021-06-15 MED ORDER — GABAPENTIN 250 MG/5ML PO SOLN
100.0000 mg | Freq: Every day | ORAL | Status: DC
  Administered 2021-06-15 – 2021-06-26 (×10): 100 mg
  Filled 2021-06-15 (×12): qty 2

## 2021-06-15 MED ORDER — OXYCODONE HCL 5 MG PO TABS
5.0000 mg | ORAL_TABLET | Freq: Four times a day (QID) | ORAL | Status: DC | PRN
  Administered 2021-06-15 – 2021-06-22 (×7): 5 mg
  Filled 2021-06-15 (×7): qty 1

## 2021-06-15 MED ORDER — ATORVASTATIN CALCIUM 40 MG PO TABS
40.0000 mg | ORAL_TABLET | Freq: Every day | ORAL | Status: DC
  Administered 2021-06-15 – 2021-07-20 (×32): 40 mg
  Filled 2021-06-15 (×33): qty 1

## 2021-06-15 MED ORDER — INSULIN ASPART 100 UNIT/ML IJ SOLN
0.0000 [IU] | INTRAMUSCULAR | Status: DC
  Administered 2021-06-15: 5 [IU] via SUBCUTANEOUS
  Administered 2021-06-15: 3 [IU] via SUBCUTANEOUS
  Administered 2021-06-15: 5 [IU] via SUBCUTANEOUS
  Administered 2021-06-16: 2 [IU] via SUBCUTANEOUS
  Administered 2021-06-17: 5 [IU] via SUBCUTANEOUS
  Administered 2021-06-17: 8 [IU] via SUBCUTANEOUS
  Administered 2021-06-17: 2 [IU] via SUBCUTANEOUS
  Administered 2021-06-17: 3 [IU] via SUBCUTANEOUS
  Administered 2021-06-17: 11 [IU] via SUBCUTANEOUS
  Administered 2021-06-18 – 2021-06-19 (×3): 2 [IU] via SUBCUTANEOUS
  Administered 2021-06-19: 5 [IU] via SUBCUTANEOUS
  Administered 2021-06-19: 2 [IU] via SUBCUTANEOUS
  Administered 2021-06-20: 3 [IU] via SUBCUTANEOUS
  Administered 2021-06-20 – 2021-06-21 (×4): 2 [IU] via SUBCUTANEOUS
  Administered 2021-06-21: 3 [IU] via SUBCUTANEOUS
  Administered 2021-06-22: 2 [IU] via SUBCUTANEOUS
  Administered 2021-06-22 (×3): 3 [IU] via SUBCUTANEOUS

## 2021-06-15 MED ORDER — ACETAMINOPHEN 325 MG PO TABS
650.0000 mg | ORAL_TABLET | Freq: Four times a day (QID) | ORAL | Status: DC | PRN
  Administered 2021-06-15 – 2021-07-15 (×11): 650 mg
  Filled 2021-06-15 (×11): qty 2

## 2021-06-15 MED ORDER — SODIUM ZIRCONIUM CYCLOSILICATE 10 G PO PACK
10.0000 g | PACK | Freq: Once | ORAL | Status: AC
  Administered 2021-06-15: 10 g via ORAL
  Filled 2021-06-15: qty 1

## 2021-06-15 MED ORDER — INSULIN GLARGINE-YFGN 100 UNIT/ML ~~LOC~~ SOLN
5.0000 [IU] | Freq: Every day | SUBCUTANEOUS | Status: DC
  Administered 2021-06-15 – 2021-06-17 (×2): 5 [IU] via SUBCUTANEOUS
  Filled 2021-06-15 (×3): qty 0.05

## 2021-06-15 MED ORDER — RENA-VITE PO TABS
1.0000 | ORAL_TABLET | Freq: Every day | ORAL | Status: DC
  Administered 2021-06-15 – 2021-07-20 (×33): 1
  Filled 2021-06-15 (×34): qty 1

## 2021-06-15 NOTE — Progress Notes (Signed)
[facility] Progress Note   Subjective: Didn't receive HD last PM D/T issues with AVF. Not talking today.    Objective Vitals:   06/14/21 1622 06/14/21 2034 06/15/21 0511 06/15/21 0954  BP: 101/68 91/72 100/79 92/62  Pulse: 100 95 100 99  Resp: 18  16 16   Temp: 98.8 F (37.1 C) (!) 97.4 F (36.3 C) 98.5 F (36.9 C) 98 F (36.7 C)  TempSrc: Oral Oral Axillary Axillary  SpO2: 97% 99% 100% 100%  Weight:      Height:       Physical Exam General: Chronically appearing ill female in NAD Heart: S1,S2 RRR No M/R/G Lungs: CTAB. No WOB.  Abdomen: NABS Extremities: No LE edema  Dialysis Access: L AVG + T/B Swelling noted over AVG, with faint ecchymosis present. Swelling noted LUE. Contracture distal to AVG.    Additional Objective Labs: Basic Metabolic Panel: Recent Labs  Lab 06/11/21 1653 06/12/21 0640 06/14/21 1136 06/15/21 0417 06/15/21 0729  NA  --  124* 131* 132*  --   K  --  3.8 4.6 5.5* 5.5*  CL  --  89* 96* 96*  --   CO2  --  27 28 30   --   GLUCOSE  --  205* 270* 245*  --   BUN  --  9 17 23   --   CREATININE  --  4.43* 4.13* 4.54*  --   CALCIUM  --  7.8* 7.5* 7.7*  --   PHOS 2.5 2.2* 1.1*  --   --    Liver Function Tests: Recent Labs  Lab 06/14/21 1136  ALBUMIN 1.9*   No results for input(s): LIPASE, AMYLASE in the last 168 hours. CBC: Recent Labs  Lab 06/09/21 0828 06/11/21 0537 06/12/21 0640 06/14/21 1136 06/15/21 0417  WBC 8.6 7.6  --  13.7*  --   NEUTROABS 6.0 5.4  --   --   --   HGB 8.6* 7.4* 7.4* 7.1* 7.1*  HCT 26.5* 23.1* 22.5* 22.4* 22.9*  MCV 94.0 94.3  --  95.3  --   PLT 210 191  --  152  --    Blood Culture    Component Value Date/Time   SDES URINE, CATHETERIZED 05/02/2021 0022   SPECREQUEST NONE 05/02/2021 0022   CULT  05/02/2021 0022    NO GROWTH Performed at [facility], [place] [address], [place], [place] [address]    REPTSTATUS 05/04/2021 FINAL 05/02/2021 0022    Cardiac Enzymes: No results for  input(s): CKTOTAL, CKMB, CKMBINDEX, TROPONINI in the last 168 hours. CBG: Recent Labs  Lab 06/14/21 0824 06/14/21 1141 06/14/21 1736 06/14/21 2153 06/15/21 0833  GLUCAP 184* 240* 209* 192* 249*   Iron Studies: No results for input(s): IRON, TIBC, TRANSFERRIN, FERRITIN in the last 72 hours. @lablastinr3 @ Studies/Results: DG Abd Portable 1V  Result Date: 06/13/2021 CLINICAL DATA:  Feeding tube placement EXAM: PORTABLE ABDOMEN - 1 VIEW COMPARISON:  None. FINDINGS: Feeding tube with the tip projecting over the antrum of the stomach. Mild gaseous distension of the small bowel and colon. No bowel dilatation to suggest obstruction. No evidence of pneumoperitoneum, portal venous gas or pneumatosis. No pathologic calcifications along the expected course of the ureters. Elevation of the right diaphragm. No acute osseous abnormality. IMPRESSION: Feeding tube with the tip projecting over the antrum of the stomach. Electronically Signed   By: Kathreen Devoid M.D.   On: 06/13/2021 12:01   Medications:  sodium chloride     sodium chloride  sodium chloride     feeding supplement (OSMOLITE 1.5 CAL) 1,000 mL (06/15/21 0001)    apixaban  2.5 mg Per Tube BID   atorvastatin  40 mg Per Tube Daily   Chlorhexidine Gluconate Cloth  6 each Topical Q0600   collagenase   Topical Daily   darbepoetin (ARANESP) injection - DIALYSIS  150 mcg Intravenous Q Sat-HD   feeding supplement (PROSource TF)  45 mL Per Tube Daily   gabapentin  100 mg Per Tube QHS   insulin aspart  0-15 Units Subcutaneous Q4H   insulin glargine-yfgn  5 Units Subcutaneous Daily   midodrine  10 mg Per Tube TID WC   mirtazapine  7.5 mg Per Tube QHS   multivitamin  1 tablet Per Tube QHS   pantoprazole sodium  40 mg Per Tube Daily   sodium chloride flush  3 mL Intravenous Q12H   sodium zirconium cyclosilicate  10 g Oral Daily   thiamine  100 mg Per Tube Daily   venlafaxine  37.5 mg Per Tube BID     Dialysis Orders: AF TTS here 3h 72mn  400/500    62.5kg    3K/2.5 Ca    P2  AVG HeRO   Hep none - Mircera 50 q 2 wks  (7/13)  Venofer 50 q wk - Parsabiv 571mIV q HDlower extremity edema, left upper arm swelling, nontender dialysis Access: Left AVG positive bruit, with upper arm swelling   Assessment/Plan: AMS/FTT: Providers and palliative care met w/ family and discussed pt's decline. Due to declining mental and physical status we don't anticipate she will be able to do OP dialysis.  Pt will have to be able to sit in a chair for 4-6 hrs to qualify for OP HD w/ our group. If she is able to sit for dialysis and cooperate, then the next step would to re-CLIP the patient as her OP HD unit will not take her back.  Trial of recliner dialysis this week. Cortrak per RD for nutrition support.  2.   Melena/Rectal bleeding - GI consulted. Hx rectal ulcers.  EGD 7/27 normal / flex sig =mucosal ulceration c/w sterocoral ulcers, non bleeding.  Hgb stable 8.6 ESRD: HD TTS here (usual MWF outpatient).  Continue TTS for now pending dispo. Unfortunately did NOT have HD overnight as ordered-nephrology not contacted. Edematous AVG, now needs TDC. Consult IR.  Next HD 06/16/21 after placement of TDC. Hyperkalemia-K+ 5.5 this AM. On daily lokelma. Give now and  follow labs.  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 Contracture distal to AVG Hypertension/volume  - BP soft. On midodrine for BP support. Edema improving, UF as tolerated.  Under edw if weights correct. Nutrition: Albumin low. Continue protein supps.  TF per RD once Cortrak in place.  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  Anemia  - Hgb 7.4 Aranesp 10066mIV q Saturday. Increase dose for 06/14/21 Issues with AVG: Will consult IR for catheter placement.  Dispo: DNR/DNI. Poor prognosis. Palliative care following to establish goals of care. Appreciate their involvement. We do not belive dialysis is adding to her  quality of life. Per last note, family wishes to continue dialysis for now. At this point does not appear suitable for outpatient dialysis.     Amy [name] H. Jencarlo Bonadonna NP-C 06/15/2021, 10:48 AM  [phone]

## 2021-06-15 NOTE — Plan of Care (Signed)
  Problem: Activity: Goal: Risk for activity intolerance will decrease Outcome: Not Progressing   

## 2021-06-15 NOTE — Progress Notes (Signed)
After assessing the AV access upper arm-L,  it is very inappropriate to continue with the cannulization due to pt 's left extremity condition ( edematous, painful tender and pt's inability to extend her left extremity).  HD tx has not been initiated,  pt transferred back to her room for further evaluation.  MD on call made aware.

## 2021-06-15 NOTE — Progress Notes (Signed)
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  On presentation, hemoglobin was 9.6; GI was consulted.  She underwent EGD which was normal and flexible sigmoidoscopy which showed nonbleeding distal rectal ulcers/stercoral ulcers.  Subsequently, GI signed off. Neurology was also consulted for altered mental status; MRI brain was similar to prior MRI at [facility] with a large right MCA stroke.  Nephrology following for dialysis needs; nephrology is stating that patient will not be accepted back to outpatient dialysis at this time due to her underlying confusion, weakness and unsafe to sit in a dialysis recliner as an outpatient.  Due to her poor oral intake, IR was consulted for possible PEG tube placement but IR was unable to place because of overlying transverse colon.  General surgery was consulted for G-tube placement but family currently undecided.  Palliative care following for goals of care discussion.   Assessment & Plan:   Principal Problem:   GI bleed Active Problems:   Essential hypertension   Type 2 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, with long-term current use of insulin (HCC)   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   ESRD on dialysis ([place])   Dyslipidemia   Hypokalemia   Stage 2 skin ulcer of sacral region [facility])   Cerebral thrombosis with cerebral infarction   Protein-calorie malnutrition, severe   GI bleed  secondary to rectal ulcers: Patient presented with anemia,  underwent EGD which was normal and flex sigmoidoscopy with clean base, nonbleeding distal rectal ulcers (stercoral ulcers). Gastroenterology now signed off on 05/22/21. Hemoglobin remains stable. / No further reports of bleeding during this hospitalization.   Okay to restart Eliquis per GI Eliquis was held again on 06/09/2021.   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 MRI brain findings were similar in appearance to her prior MRI at [facility] with no drastic changes which is notable for a large right MCA stroke with advanced microvascular ischemic changes of the white matter; MRA head degraded but did not show occlusion and bilateral carotid ultrasound revealed R ICA and LICA with [id number] stenosis.  EEG with cortical dysfunction right hemisphere likely secondary to underlying structural abnormality/stroke and moderate encephalopathy with no seizure activity or epileptiform discharges.  Hemoglobin A1c 5.1, LDL 68.  No further recommendations per neurology and signed off 05/28/2021. Continue atorvastatin 40 mg p.o. daily Diet as per SLP recommendations   Hyperlipidemia : Continue statin   Type 2 diabetes mellitus with episodes of hypoglycemia Hemoglobin A1c 5.1.  Diet controlled at baseline. Patient has had episodes of hypoglycemia due to poor oral intake.  She was started on D5 drip on 06/05/2021.  Monitor CBGs.   ESRD on HD Per Nephro Patient is not a candidate for continued outpatient HD given her confusion, weakness and inability to sit up in a chair for HD outpatient.  Overall HD is not enhancing her quality of life at this time. Continue on HD while inpatient. At this point she doesn't appear suitable for outpatient dialysis.  Nephrology is recommending consideration of transition to hospice.   As per palliative care discussion with family on  06/05/2021: Family wants to continue to treat the treatable with hopes for improvement and to continue hemodialysis as long as patient can tolerate. Patient will need either LTAC placement or another outpatient hemodialysis unit needs to be found. Unfortunately she did not have hemodialysis yesterday due to problem with AV fistula.  Nephrology is suggesting [facility] for next hemodialysis.  IR consulted.  Hyperkalemia: This could be secondary to missed dialysis, continue Lokelma daily.   Chronic thrombus left upper extremity. Eliquis held again on 06/09/2021 in case patient would undergo G-tube placement.   Cortrak placement by dietitian before we resume Eliquis .   Hypotension: Blood pressure currently is stable.  Continue midodrine   Stage II mid coccyx pressure injury, present on admission Continue wound care   Acute metabolic encephalopathy,  failure to thrive /Generalized deconditioning  /very poor oral intake Goals of care discussion. Neurology has already signed off.  Overall prognosis is very poor. Due to her poor oral intake, IR was consulted for PEG placement; unfortunately unable to place on 8/4 due to overlying transverse colon.   General surgery was consulted on 8/5 for surgical G-tube placement but family was not decided at that time. If condition does not improve, recommend total comfort measures. Oral intake  remains very poor General surgery was reconsulted on 06/09/2021 for possible G-tube placement: After general surgery discussion with family, family has agreed with cortrak placement and initiation of feeding through the tube.  Dietitian has been reconsulted. Patient underwent cortrek placement on 2/16.  cortrek tube was clogged, removed and new tube was inserted. Will address options of hospice care or comfort care with the family again.   Hyponatremia. Being managed by nephrology by dialysis.   Urinary tract infection Completed 3-day course of ceftriaxone   Hepatic  hypodensity Incidental finding of an ovoid hepatic hypodensity anterior aspect of liver measuring up to 4.9 cm, could represent mass versus focal fatty infiltration.   Will need outpatient follow-up with MRI liver for further evaluation if goals of care remain aggressive.   Leukocytosis Resolved.  GERD Continue PPI     DVT prophylaxis: Eliquis Held. Code Status: DNR Family Communication: No family at bed side. Disposition Plan:    Status is: Inpatient  Remains inpatient appropriate because:Inpatient level of care appropriate due to severity of illness  Dispo: The patient is from: SNF              Anticipated d/c is to: SNF              Patient currently is not medically stable to d/c.   Difficult to place patient No  Consultants:  Neurology, palliative care, general surgery, IR, GI  Procedures: EEG 8/1: Suggestive of cortical dysfunction right hemisphere likely secondary to underlying structural abnormality/stroke, moderate encephalopathy, no seizure activity or epileptiform discharges. EGD 7/27, normal exam Flex sigmoidoscopy 7/27: 4 nonbleeding ulcers  Antimicrobials:   Anti-infectives (From admission, onward)    Start     Dose/Rate Route Frequency Ordered Stop   05/29/21 1622  ceFAZolin (ANCEF) 2-4 GM/100ML-% IVPB       Note to Pharmacy: Lytle [place]   : cabinet override      05/29/21 1622 05/30/21 0429   05/29/21 0000  cefTRIAXone (ROCEPHIN) 1 g in sodium chloride 0.9 % 100 mL IVPB  Status:  Discontinued        1 g 200 mL/hr over  30 Minutes Intravenous Every 24 hours 05/28/21 1801 05/28/21 1801   05/28/21 1830  cefTRIAXone (ROCEPHIN) 1 g in sodium chloride 0.9 % 100 mL IVPB        1 g 200 mL/hr over 30 Minutes Intravenous Every 24 hours 05/28/21 1801 05/30/21 1907   05/28/21 1630  ceFAZolin (ANCEF) IVPB 2g/100 mL premix        2 g 200 mL/hr over 30 Minutes Intravenous To Radiology 05/28/21 1537 05/29/21 1630        Subjective: Patient was seen and examined  at bedside.  Overnight events noted. Patient is partially awake, arousable, does not follow commands.  She has Cortrek for nutritional support.   She appears chronically ill looking and very deconditioned.    Objective: Vitals:   06/14/21 1622 06/14/21 2034 06/15/21 0511 06/15/21 0954  BP: 101/68 91/72 100/79 92/62  Pulse: 100 95 100 99  Resp: '18  16 16  '$ Temp: 98.8 F (37.1 C) (!) 97.4 F (36.3 C) 98.5 F (36.9 C) 98 F (36.7 C)  TempSrc: Oral Oral Axillary Axillary  SpO2: 97% 99% 100% 100%  Weight:      Height:        Intake/Output Summary (Last 24 hours) at 06/15/2021 1125 Last data filed at 06/15/2021 0900 Gross per 24 hour  Intake 0 ml  Output 0 ml  Net 0 ml   Filed Weights   06/08/21 0538 06/09/21 0611 06/11/21 0446  Weight: 57 kg 56.9 kg 60 kg    Examination:  General exam: Chronically ill looking and deconditioned, lying comfortably.  Cortrek noted. Respiratory system: Decreased breath sounds bilaterally.  RR 17 Cardiovascular system: S1-S2 normal, regular rate and rhythm, no murmur Gastrointestinal system: Abdomen is soft, mildly distended, nontender, bowel sounds positive  Central nervous system: Alert, partially arousable, left-sided weakness present. Extremities: Left arm swelling noted.  Swelling noted over AV graft, contracture distal to AVG Skin: No rashes, lesions or ulcers Psychiatry: not assessed.    Data Reviewed: I have personally reviewed following labs and imaging studies  CBC: Recent Labs  Lab 06/09/21 0828 06/11/21 0537 06/12/21 0640 06/14/21 1136 06/15/21 0417  WBC 8.6 7.6  --  13.7*  --   NEUTROABS 6.0 5.4  --   --   --   HGB 8.6* 7.4* 7.4* 7.1* 7.1*  HCT 26.5* 23.1* 22.5* 22.4* 22.9*  MCV 94.0 94.3  --  95.3  --   PLT 210 191  --  152  --    Basic Metabolic Panel: Recent Labs  Lab 06/09/21 0828 06/11/21 0537 06/11/21 1653 06/12/21 0640 06/14/21 1136 06/15/21 0417 06/15/21 0729  NA 125* 128*  --  124* 131* 132*  --   K  3.6 4.2  --  3.8 4.6 5.5* 5.5*  CL 88* 92*  --  89* 96* 96*  --   CO2 28 27  --  '27 28 30  '$ --   GLUCOSE 88 131*  --  205* 270* 245*  --   BUN 12 7*  --  '9 17 23  '$ --   CREATININE 4.76* 3.69*  --  4.43* 4.13* 4.54*  --   CALCIUM 7.7* 8.2*  --  7.8* 7.5* 7.7*  --   MG  --  1.7 1.7 1.7  --   --   --   PHOS  --  2.5 2.5 2.2* 1.1*  --   --    GFR: Estimated Creatinine Clearance: 10.5 mL/min (A) (by C-G formula based on SCr  of 4.54 mg/dL (H)). Liver Function Tests: Recent Labs  Lab 06/14/21 1136  ALBUMIN 1.9*   No results for input(s): LIPASE, AMYLASE in the last 168 hours. No results for input(s): AMMONIA in the last 168 hours. Coagulation Profile: No results for input(s): INR, PROTIME in the last 168 hours. Cardiac Enzymes: No results for input(s): CKTOTAL, CKMB, CKMBINDEX, TROPONINI in the last 168 hours. BNP (last 3 results) No results for input(s): PROBNP in the last 8760 hours. HbA1C: No results for input(s): HGBA1C in the last 72 hours. CBG: Recent Labs  Lab 06/14/21 0824 06/14/21 1141 06/14/21 1736 06/14/21 2153 06/15/21 0833  GLUCAP 184* 240* 209* 192* 249*   Lipid Profile: No results for input(s): CHOL, HDL, LDLCALC, TRIG, CHOLHDL, LDLDIRECT in the last 72 hours. Thyroid Function Tests: No results for input(s): TSH, T4TOTAL, FREET4, T3FREE, THYROIDAB in the last 72 hours. Anemia Panel: No results for input(s): VITAMINB12, FOLATE, FERRITIN, TIBC, IRON, RETICCTPCT in the last 72 hours. Sepsis Labs: No results for input(s): PROCALCITON, LATICACIDVEN in the last 168 hours.  No results found for this or any previous visit (from the past 240 hour(s)).   Radiology Studies: DG Abd Portable 1V  Result Date: 06/13/2021 CLINICAL DATA:  Feeding tube placement EXAM: PORTABLE ABDOMEN - 1 VIEW COMPARISON:  None. FINDINGS: Feeding tube with the tip projecting over the antrum of the stomach. Mild gaseous distension of the small bowel and colon. No bowel dilatation to suggest  obstruction. No evidence of pneumoperitoneum, portal venous gas or pneumatosis. No pathologic calcifications along the expected course of the ureters. Elevation of the right diaphragm. No acute osseous abnormality. IMPRESSION: Feeding tube with the tip projecting over the antrum of the stomach. Electronically Signed   By: Kathreen Devoid M.D.   On: 06/13/2021 12:01     Scheduled Meds:  apixaban  2.5 mg Per Tube BID   atorvastatin  40 mg Per Tube Daily   Chlorhexidine Gluconate Cloth  6 each Topical Q0600   collagenase   Topical Daily   darbepoetin (ARANESP) injection - DIALYSIS  150 mcg Intravenous Q Sat-HD   feeding supplement (PROSource TF)  45 mL Per Tube Daily   gabapentin  100 mg Per Tube QHS   insulin aspart  0-15 Units Subcutaneous Q4H   insulin glargine-yfgn  5 Units Subcutaneous Daily   midodrine  10 mg Per Tube TID WC   mirtazapine  7.5 mg Per Tube QHS   multivitamin  1 tablet Per Tube QHS   pantoprazole sodium  40 mg Per Tube Daily   sodium chloride flush  3 mL Intravenous Q12H   sodium zirconium cyclosilicate  10 g Per Tube Daily   thiamine  100 mg Per Tube Daily   venlafaxine  37.5 mg Per Tube BID   Continuous Infusions:  sodium chloride     sodium chloride     sodium chloride     feeding supplement (OSMOLITE 1.5 CAL) 1,000 mL (06/15/21 0001)     LOS: 26 days    Time spent: 25 mins    Zorina Mallin, MD [facility]   If 7PM-7AM, please contact night-coverage

## 2021-06-16 DIAGNOSIS — K922 Gastrointestinal hemorrhage, unspecified: Secondary | ICD-10-CM | POA: Diagnosis not present

## 2021-06-16 LAB — RENAL FUNCTION PANEL
Albumin: 1.9 g/dL — ABNORMAL LOW (ref 3.5–5.0)
Anion gap: 7 (ref 5–15)
BUN: 32 mg/dL — ABNORMAL HIGH (ref 8–23)
CO2: 29 mmol/L (ref 22–32)
Calcium: 7.8 mg/dL — ABNORMAL LOW (ref 8.9–10.3)
Chloride: 94 mmol/L — ABNORMAL LOW (ref 98–111)
Creatinine, Ser: 4.93 mg/dL — ABNORMAL HIGH (ref 0.44–1.00)
GFR, Estimated: 9 mL/min — ABNORMAL LOW (ref >60)
Glucose, Bld: 66 mg/dL — ABNORMAL LOW (ref 70–99)
Phosphorus: <1 mg/dL — CL (ref 2.5–4.6)
Potassium: 5.4 mmol/L — ABNORMAL HIGH (ref 3.5–5.1)
Sodium: 130 mmol/L — ABNORMAL LOW (ref 135–145)

## 2021-06-16 LAB — GLUCOSE, CAPILLARY
Glucose-Capillary: 100 mg/dL — ABNORMAL HIGH (ref 70–99)
Glucose-Capillary: 115 mg/dL — ABNORMAL HIGH (ref 70–99)
Glucose-Capillary: 122 mg/dL — ABNORMAL HIGH (ref 70–99)
Glucose-Capillary: 44 mg/dL — CL (ref 70–99)
Glucose-Capillary: 50 mg/dL — ABNORMAL LOW (ref 70–99)
Glucose-Capillary: 53 mg/dL — ABNORMAL LOW (ref 70–99)
Glucose-Capillary: 58 mg/dL — ABNORMAL LOW (ref 70–99)
Glucose-Capillary: 59 mg/dL — ABNORMAL LOW (ref 70–99)
Glucose-Capillary: 63 mg/dL — ABNORMAL LOW (ref 70–99)
Glucose-Capillary: 81 mg/dL (ref 70–99)
Glucose-Capillary: 97 mg/dL (ref 70–99)
Glucose-Capillary: 97 mg/dL (ref 70–99)

## 2021-06-16 LAB — TYPE AND SCREEN
ABO/RH(D): O POS
Antibody Screen: NEGATIVE

## 2021-06-16 LAB — HEMOGLOBIN AND HEMATOCRIT, BLOOD
HCT: 23.1 % — ABNORMAL LOW (ref 36.0–46.0)
Hemoglobin: 7.4 g/dL — ABNORMAL LOW (ref 12.0–15.0)

## 2021-06-16 MED ORDER — DARBEPOETIN ALFA 150 MCG/0.3ML IJ SOSY
PREFILLED_SYRINGE | INTRAMUSCULAR | Status: AC
  Administered 2021-06-16: 150 ug via INTRAVENOUS
  Filled 2021-06-16: qty 0.3

## 2021-06-16 MED ORDER — ALBUMIN HUMAN 25 % IV SOLN
25.0000 g | INTRAVENOUS | Status: AC
  Administered 2021-06-16: 25 g via INTRAVENOUS

## 2021-06-16 MED ORDER — DEXTROSE 50 % IV SOLN
INTRAVENOUS | Status: AC
  Administered 2021-06-16: 50 mL
  Filled 2021-06-16: qty 50

## 2021-06-16 MED ORDER — DARBEPOETIN ALFA 150 MCG/0.3ML IJ SOSY
150.0000 ug | PREFILLED_SYRINGE | INTRAMUSCULAR | Status: DC
  Administered 2021-06-23: 150 ug via INTRAVENOUS
  Filled 2021-06-16: qty 0.3

## 2021-06-16 MED ORDER — SODIUM PHOSPHATES 45 MMOLE/15ML IV SOLN
30.0000 mmol | Freq: Once | INTRAVENOUS | Status: AC
  Administered 2021-06-16: 30 mmol via INTRAVENOUS
  Filled 2021-06-16: qty 10

## 2021-06-16 MED ORDER — DEXTROSE 50 % IV SOLN
25.0000 g | INTRAVENOUS | Status: AC
  Administered 2021-06-16: 25 g via INTRAVENOUS

## 2021-06-16 MED ORDER — DEXTROSE 50 % IV SOLN
INTRAVENOUS | Status: AC
  Administered 2021-06-16: 25 g via INTRAVENOUS
  Filled 2021-06-16: qty 50

## 2021-06-16 MED ORDER — DEXTROSE 50 % IV SOLN: 25.0000 g | INTRAVENOUS | Status: AC

## 2021-06-16 NOTE — Progress Notes (Signed)
HR 110s, MEWS yellow. Pt repeatedly saying "ouch"..."it hurts". PRN oxycodone and tylenol given and pt went to sleep. However repeat vitals, HR still 110s. MD notified of HR and low Hgb last few days. Per MD draw labs sooner if able. HR decreased slightly overnight to 108 in morning.   Plan for pt to have tunneled HD cath placed in morning. Eliquis due at bedtime, checked with covering MD who does not think it needs to be held. Charge RN checked with pharmacist who recommended giving eliquis. Also no order in chart noted to hold med in prep for procedure so eliquis given as scheduled.

## 2021-06-16 NOTE — Progress Notes (Signed)
Phone call received from HD dept regarding when this pt will be brought down for HD catheter placement today. Per Rad PA notes, nephrology MD would like to try using AVF again as pt may not be a candidate for outpatient HD. I informed the caller of this information. She states that she called IR dept this morning after speaking with floor RN and someone told her that the pt would be brought down for procedure this afternoon. I asked who she spoke with, she was not able to tell me a name. I advised that due to emergency procedure it is unlikely that we will be able to bring pt down for procedure today. Also, pt has not been prepared for this procedure from a radiology standpoint; consent, labwork, etc.  Radiology PA informed to follow up with nephrology regarding needs for this pt.

## 2021-06-16 NOTE — Progress Notes (Signed)
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  On presentation, hemoglobin was 9.6; GI was consulted.  She underwent EGD which was normal and flexible sigmoidoscopy which showed nonbleeding distal rectal ulcers/stercoral ulcers.  Subsequently, GI signed off. Neurology was also consulted for altered mental status; MRI brain was similar to prior MRI at [facility] with a large right MCA stroke.  Nephrology following for dialysis needs; nephrology is stating that patient will not be accepted back to outpatient dialysis at this time due to her underlying confusion, weakness and unsafe to sit in a dialysis recliner as an outpatient.  Due to her poor oral intake, IR was consulted for possible PEG tube placement but IR was unable to place because of overlying transverse colon.  General surgery was consulted for G-tube placement but family currently undecided.  Palliative care following for goals of care discussion.   Assessment & Plan:   Principal Problem:   GI bleed Active Problems:   Essential hypertension   Type 2 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, with long-term current use of insulin (HCC)   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   ESRD on dialysis ([place])   Dyslipidemia   Hypokalemia   Stage 2 skin ulcer of sacral region [facility])   Cerebral thrombosis with cerebral infarction   Protein-calorie malnutrition, severe   GI bleed  secondary to rectal ulcers: Patient presented with anemia,  underwent EGD which was normal and flex sigmoidoscopy with clean base, nonbleeding distal rectal ulcers (stercoral ulcers). Gastroenterology now signed off on 05/22/21. Hemoglobin remains stable. / No further reports of bleeding during this hospitalization.   Okay to restart Eliquis per GI Eliquis was held again on 06/09/2021.   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 MRI brain findings were similar in appearance to her prior MRI at [facility] with no drastic changes which is notable for a large right MCA stroke with advanced microvascular ischemic changes of the white matter; MRA head degraded but did not show occlusion and bilateral carotid ultrasound revealed R ICA and LICA with [id number] stenosis.  EEG with cortical dysfunction right hemisphere likely secondary to underlying structural abnormality/stroke and moderate encephalopathy with no seizure activity or epileptiform discharges.  Hemoglobin A1c 5.1, LDL 68.  No further recommendations per neurology and signed off 05/28/2021. Continue atorvastatin 40 mg p.o. daily Diet as per SLP recommendations   Hyperlipidemia : Continue statin.   Type 2 diabetes mellitus with episodes of hypoglycemia Hemoglobin A1c 5.1.  Diet controlled at baseline. Patient has had episodes of hypoglycemia due to poor oral intake.  She was started on D5 drip on 06/05/2021.  Monitor CBGs.   ESRD on HD Per Nephro Patient is not a candidate for continued outpatient HD given her confusion, weakness and inability to sit up in a chair for HD outpatient.  Overall HD is not enhancing her quality of life at this time. Continue on HD while inpatient. At this point she doesn't appear suitable for outpatient dialysis.  Nephrology is recommending consideration of transition to hospice.   As per palliative care discussion with family on  06/05/2021: Family wants to continue to treat the treatable with hopes for improvement and to continue hemodialysis as long as patient can tolerate. Patient will need either LTAC placement or another outpatient hemodialysis unit needs to be found. Unfortunately she did not have hemodialysis 8/20 due to problem with AV fistula.  Nephrology is suggesting [facility] for next hemodialysis.   IR consulted.  Plan is for dialysis today.  Hyperkalemia: This could be secondary to missed dialysis, continue Lokelma daily.   Chronic thrombus left upper extremity. Eliquis held again on 06/09/2021 in case patient would undergo G-tube placement.   Cortrak placement by dietitian before we resume Eliquis .   Hypotension: Blood pressure currently is stable.  Continue midodrine   Stage II mid coccyx pressure injury, present on admission Continue wound care   Acute metabolic encephalopathy,  failure to thrive /Generalized deconditioning  /very poor oral intake Goals of care discussion. Neurology has already signed off.  Overall prognosis is very poor. Due to her poor oral intake, IR was consulted for PEG placement; unfortunately unable to place on 8/4 due to overlying transverse colon.   General surgery was consulted on 8/5 for surgical G-tube placement but family was not decided at that time. If condition does not improve, recommend total comfort measures. Oral intake  remains very poor General surgery was reconsulted on 06/09/2021 for possible G-tube placement: After general surgery discussion with family, family has agreed with cortrak placement and initiation of feeding through the tube.  Dietitian has been reconsulted. Patient underwent cortrek placement on 2/16.  cortrek tube was clogged, removed and new tube was inserted. Will address options of hospice care or comfort care with the family again.   Hyponatremia. Being managed by nephrology by dialysis.   Urinary tract infection Completed 3-day course of  ceftriaxone   Hepatic hypodensity Incidental finding of an ovoid hepatic hypodensity anterior aspect of liver measuring up to 4.9 cm, could represent mass versus focal fatty infiltration.   Will need outpatient follow-up with MRI liver for further evaluation if goals of care remain aggressive.   Leukocytosis Resolved.  GERD Continue PPI     DVT prophylaxis: Eliquis Held. Code Status: DNR Family Communication: No family at bed side. Disposition Plan:    Status is: Inpatient  Remains inpatient appropriate because:Inpatient level of care appropriate due to severity of illness  Dispo: The patient is from: SNF              Anticipated d/c is to: SNF              Patient currently is not medically stable to d/c.   Difficult to place patient No  Consultants:  Neurology, palliative care, general surgery, IR, GI  Procedures: EEG 8/1: Suggestive of cortical dysfunction right hemisphere likely secondary to underlying structural abnormality/stroke, moderate encephalopathy, no seizure activity or epileptiform discharges. EGD 7/27, normal exam Flex sigmoidoscopy 7/27: 4 nonbleeding ulcers  Antimicrobials:   Anti-infectives (From admission, onward)    Start     Dose/Rate Route Frequency Ordered Stop   05/29/21 1622  ceFAZolin (ANCEF) 2-4 GM/100ML-% IVPB       Note to Pharmacy: [place]   : cabinet override      05/29/21 1622 05/30/21 0429   05/29/21 0000  cefTRIAXone (ROCEPHIN) 1 g in sodium chloride 0.9 % 100 mL IVPB  Status:  Discontinued  1 g 200 mL/hr over 30 Minutes Intravenous Every 24 hours 05/28/21 1801 05/28/21 1801   05/28/21 1830  cefTRIAXone (ROCEPHIN) 1 g in sodium chloride 0.9 % 100 mL IVPB        1 g 200 mL/hr over 30 Minutes Intravenous Every 24 hours 05/28/21 1801 05/30/21 1907   05/28/21 1630  ceFAZolin (ANCEF) IVPB 2g/100 mL premix        2 g 200 mL/hr over 30 Minutes Intravenous To Radiology 05/28/21 1537 05/29/21 1630         Subjective: Patient was seen and examined at bedside.  Overnight events noted. Patient is partially awake, she is arousable but does not follow commands.  She has Cortrek for nutritional support.   She appears chronically ill looking and very deconditioned.    Objective: Vitals:   06/16/21 0128 06/16/21 0506 06/16/21 0509 06/16/21 0903  BP: 94/85 (!) 115/40 (!) 115/54 (!) 86/38  Pulse: (!) 110 (!) 108  99  Resp: 19   19  Temp: 98.3 F (36.8 C) 98.8 F (37.1 C)  98.1 F (36.7 C)  TempSrc:  Oral    SpO2: 100% 100%  100%  Weight:      Height:        Intake/Output Summary (Last 24 hours) at 06/16/2021 1415 Last data filed at 06/16/2021 1300 Gross per 24 hour  Intake 100 ml  Output 0 ml  Net 100 ml   Filed Weights   06/08/21 0538 06/09/21 0611 06/11/21 0446  Weight: 57 kg 56.9 kg 60 kg    Examination:  General exam: Chronically ill looking and deconditioned, lying comfortably.  Cortrek noted. Respiratory system: Decreased breath sounds bilaterally.  RR 17 Cardiovascular system: S1-S2 normal, regular rate and rhythm, no murmur Gastrointestinal system: Abdomen is soft, mildly distended, nontender, bowel sounds positive  Central nervous system: Alert, arousable, left-sided weakness present. Extremities: Left arm swelling noted.  Swelling noted over AV graft, contracture distal to AVG Skin: No rashes, lesions or ulcers Psychiatry: not assessed.    Data Reviewed: I have personally reviewed following labs and imaging studies  CBC: Recent Labs  Lab 06/11/21 0537 06/12/21 0640 06/14/21 1136 06/15/21 0417 06/16/21 0323  WBC 7.6  --  13.7*  --   --   NEUTROABS 5.4  --   --   --   --   HGB 7.4* 7.4* 7.1* 7.1* 7.4*  HCT 23.1* 22.5* 22.4* 22.9* 23.1*  MCV 94.3  --  95.3  --   --   PLT 191  --  152  --   --    Basic Metabolic Panel: Recent Labs  Lab 06/11/21 0537 06/11/21 1653 06/12/21 0640 06/14/21 1136 06/15/21 0417 06/15/21 0729 06/16/21 0323  NA 128*  --   124* 131* 132*  --  130*  K 4.2  --  3.8 4.6 5.5* 5.5* 5.4*  CL 92*  --  89* 96* 96*  --  94*  CO2 27  --  '27 28 30  '$ --  29  GLUCOSE 131*  --  205* 270* 245*  --  66*  BUN 7*  --  '9 17 23  '$ --  32*  CREATININE 3.69*  --  4.43* 4.13* 4.54*  --  4.93*  CALCIUM 8.2*  --  7.8* 7.5* 7.7*  --  7.8*  MG 1.7 1.7 1.7  --   --   --   --   PHOS 2.5 2.5 2.2* 1.1*  --   --  <1.0*  GFR: Estimated Creatinine Clearance: 9.6 mL/min (A) (by C-G formula based on SCr of 4.93 mg/dL (H)). Liver Function Tests: Recent Labs  Lab 06/14/21 1136 06/16/21 0323  ALBUMIN 1.9* 1.9*   No results for input(s): LIPASE, AMYLASE in the last 168 hours. No results for input(s): AMMONIA in the last 168 hours. Coagulation Profile: No results for input(s): INR, PROTIME in the last 168 hours. Cardiac Enzymes: No results for input(s): CKTOTAL, CKMB, CKMBINDEX, TROPONINI in the last 168 hours. BNP (last 3 results) No results for input(s): PROBNP in the last 8760 hours. HbA1C: No results for input(s): HGBA1C in the last 72 hours. CBG: Recent Labs  Lab 06/16/21 0558 06/16/21 0800 06/16/21 0841 06/16/21 1155 06/16/21 1250  GLUCAP 81 53* 100* 63* 115*   Lipid Profile: No results for input(s): CHOL, HDL, LDLCALC, TRIG, CHOLHDL, LDLDIRECT in the last 72 hours. Thyroid Function Tests: No results for input(s): TSH, T4TOTAL, FREET4, T3FREE, THYROIDAB in the last 72 hours. Anemia Panel: No results for input(s): VITAMINB12, FOLATE, FERRITIN, TIBC, IRON, RETICCTPCT in the last 72 hours. Sepsis Labs: No results for input(s): PROCALCITON, LATICACIDVEN in the last 168 hours.  No results found for this or any previous visit (from the past 240 hour(s)).   Radiology Studies: No results found.   Scheduled Meds:  apixaban  2.5 mg Per Tube BID   atorvastatin  40 mg Per Tube Daily   Chlorhexidine Gluconate Cloth  6 each Topical Q0600   collagenase   Topical Daily   darbepoetin (ARANESP) injection - DIALYSIS  150 mcg  Intravenous Q Mon-HD   feeding supplement (PROSource TF)  45 mL Per Tube Daily   gabapentin  100 mg Per Tube QHS   insulin aspart  0-15 Units Subcutaneous Q4H   insulin glargine-yfgn  5 Units Subcutaneous Daily   midodrine  10 mg Per Tube TID WC   mirtazapine  7.5 mg Per Tube QHS   multivitamin  1 tablet Per Tube QHS   pantoprazole sodium  40 mg Per Tube Daily   sodium chloride flush  3 mL Intravenous Q12H   sodium zirconium cyclosilicate  10 g Per Tube Daily   thiamine  100 mg Per Tube Daily   venlafaxine  37.5 mg Per Tube BID   Continuous Infusions:  sodium chloride     sodium chloride     sodium chloride     feeding supplement (OSMOLITE 1.5 CAL) Stopped (06/16/21 0010)   sodium phosphate  Dextrose 5% IVPB 30 mmol (06/16/21 0817)     LOS: 27 days    Time spent: 25 mins    Amy Waltman, MD [facility]   If 7PM-7AM, please contact night-coverage

## 2021-06-16 NOTE — Progress Notes (Signed)
IR received message from Nephrology team that patient's fistula was successfully cannulated in HD today and that there is no need for IR to place an HD catheter. The order will be deleted.   Soyla Dryer, [place] [phone] 06/16/2021, 4:22 PM

## 2021-06-16 NOTE — Significant Event (Signed)
Hypoglycemic Event  CBG: 58, rechecked and =50.   Treatment: D50 50 mL (25 gm)  Symptoms:  pt unable to say, no change in assessment noted.   Follow-up CBG: Time: 97 CBG Result: 0452  Possible Reasons for Event: Other: tube feeding stopped at midnight due to NPO order for procedure today.   Comments/MD notified: Dr. Keturah Barre Rudean Haskell

## 2021-06-16 NOTE — Progress Notes (Signed)
Inpatient Diabetes Program Recommendations  AACE/ADA: New Consensus Statement on Inpatient Glycemic Control   Target Ranges:  Prepandial:   less than 140 mg/dL      Peak postprandial:   less than 180 mg/dL (1-2 hours)      Critically ill patients:  140 - 180 mg/dL    Results for Bova, KYNEDI LAMME (MRN [id number]) as of 06/16/2021 10:41  Ref. Range 06/16/2021 00:34 06/16/2021 04:33 06/16/2021 04:35 06/16/2021 04:52 06/16/2021 05:58 06/16/2021 08:00 06/16/2021 08:41  Glucose-Capillary Latest Ref Range: 70 - 99 mg/dL 122 (H) 58 (L) 50 (L) 97 81 53 (L) 100 (H)  Results for Ellsworth, TEMITAYO KHOSLA (MRN [id number]) as of 06/16/2021 10:41  Ref. Range 06/15/2021 08:33 06/15/2021 11:27 06/15/2021 16:40 06/15/2021 20:14  Glucose-Capillary Latest Ref Range: 70 - 99 mg/dL 249 (H) 225 (H) 225 (H) 153 (H)   Review of Glycemic Control  Diabetes history: DM2 Outpatient Diabetes medications: None; diet controlled Current orders for Inpatient glycemic control: Semglee 5 units daily, Novolog 0-15 units Q4H; Osmolite @ 50 ml/hr  Inpatient Diabetes Program Recommendations:    Insulin: Please discontinue Semglee. Once tube feeding is resumed, may want to consider ordering Novolog 2 units Q4H for tube feeding coverage.  NOTE: Noted hypoglycemia today. Per note on 06/16/21 by S. Olen Pel, RN indicates tube feeding stopped at midnight due to NPO order for procedure today.    Thanks, Barnie Alderman, RN, MSN, CDE Diabetes Coordinator Inpatient Diabetes Program [phone] (Team Pager from 8am to 5pm)

## 2021-06-16 NOTE — Progress Notes (Signed)
Subjective: Opens eyes to voice, but no speech to me.  Dialysis today with expert cannulated due to swelling infiltration last attempt  Objective Vital signs in last 24 hours: Vitals:   06/16/21 0128 06/16/21 0506 06/16/21 0509 06/16/21 0903  BP: 94/85 (!) 115/40 (!) 115/54 (!) 86/38  Pulse: (!) 110 (!) 108  99  Resp: 19   19  Temp: 98.3 F (36.8 C) 98.8 F (37.1 C)  98.1 F (36.7 C)  TempSrc:  Oral    SpO2: 100% 100%  100%  Weight:      Height:       Weight change:   Physical Exam: General: Lethargic chronically ill-appearing obese female in NAD Heart: Tacky rate but regular, no MRG Lungs: CTA anteriorly nonlabored breathing Abdomen: Obese NAB S Extremities: Trace bipedal edema Dialysis Access: Positive bruit left upper arm AVG, swelling noted left upper arm to the fingers with contracture distal to AVG  Dialysis Orders: AF TTS here 3h 67mn 400/500    62.5kg    3K/2.5 Ca    P2  AVG HeRO   Hep none - Mircera 50 q 2 wks  (7/13)  Venofer 50 q wk - Parsabiv 539mIV q HDlower extremity edema, left upper arm swelling, nontender dialysis Access: Left AVG    Problem/Plan: AMS/FTT: Providers and palliative care met w/ family and discussed pt's decline. Due to declining mental and physical status we don't anticipate she will be able to do OP dialysis.  Pt will have to be able to sit in a chair for 4-6 hrs to qualify for OP HD w/ our group. If she is able to sit for dialysis and cooperate, then the next step would to re-CLIP the patient as her OP HD unit will not take her back.  Trial of recliner dialysis this week.  2.   Melena/Rectal bleeding - GI consulted. Hx rectal ulcers.  EGD 7/27 normal / flex sig =mucosal ulceration c/w sterocoral ulcers, non bleeding.  Hgb stable 8.6 ESRD: HD TTS here (usual MWF outpatient).  Dialysis today because of Saturday access problems cannulating= if unable to cannulate today =needs TDC. Consult IR.   Hyperkalemia-K+ 5.5 8/21 > 5.4 this a.m. on daily  lokelma. 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 Contracture distal to AVG Hypertension/volume  - BP soft. On midodrine for BP support.  Now has some lower extremity edema UF as tolerated.  Under edw if weights correct. Nutrition: Albumin low. Continue protein supps.  TF per RD once Cortrak in place.  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  Anemia  - Hgb 7.4 Aranesp 10034mIV q Saturday. Increased dose for 06/14/21 Dispo: DNR/DNI. Poor prognosis. Palliative care following to establish goals of care. Appreciate their involvement. We do not belive dialysis is adding to her quality of life. Per last note, family wishes to continue dialysis for now. At this point does not appear suitable for outpatient dialysis.   DavErnest HaberA-C [facility] Beeper [id number]/2022,11:39 AM  LOS: 27 days   Labs: Basic Metabolic Panel: Recent Labs  Lab 06/12/21 0640 06/14/21 1136 06/15/21 0417 06/15/21 0729 06/16/21 0323  NA 124* 131* 132*  --  130*  K 3.8 4.6 5.5* 5.5* 5.4*  CL 89* 96* 96*  --  94*  CO2 27 28 30   --  29  GLUCOSE 205* 270* 245*  --  66*  BUN 9 17 23   --  32*  CREATININE 4.43* 4.13* 4.54*  --  4.93*  CALCIUM 7.8* 7.5* 7.7*  --  7.8*  PHOS 2.2* 1.1*  --   --  <1.0*   Liver Function Tests: Recent Labs  Lab 06/14/21 1136 06/16/21 0323  ALBUMIN 1.9* 1.9*   No results for input(s): LIPASE, AMYLASE in the last 168 hours. No results for input(s): AMMONIA in the last 168 hours. CBC: Recent Labs  Lab 06/11/21 0537 06/12/21 0640 06/14/21 1136 06/15/21 0417 06/16/21 0323  WBC 7.6  --  13.7*  --   --   NEUTROABS 5.4  --   --   --   --   HGB 7.4*   < > 7.1* 7.1* 7.4*  HCT 23.1*   < > 22.4* 22.9* 23.1*  MCV 94.3  --  95.3  --   --   PLT 191  --  152  --   --    < > = values in this interval not displayed.   Cardiac Enzymes: No results for input(s): CKTOTAL, CKMB,  CKMBINDEX, TROPONINI in the last 168 hours. CBG: Recent Labs  Lab 06/16/21 0435 06/16/21 0452 06/16/21 0558 06/16/21 0800 06/16/21 0841  GLUCAP 50* 97 81 53* 100*    Studies/Results: No results found. Medications:  sodium chloride     sodium chloride     sodium chloride     feeding supplement (OSMOLITE 1.5 CAL) Stopped (06/16/21 0010)   sodium phosphate  Dextrose 5% IVPB 30 mmol (06/16/21 0817)    apixaban  2.5 mg Per Tube BID   atorvastatin  40 mg Per Tube Daily   Chlorhexidine Gluconate Cloth  6 each Topical Q0600   collagenase   Topical Daily   darbepoetin (ARANESP) injection - DIALYSIS  150 mcg Intravenous Q Mon-HD   feeding supplement (PROSource TF)  45 mL Per Tube Daily   gabapentin  100 mg Per Tube QHS   insulin aspart  0-15 Units Subcutaneous Q4H   insulin glargine-yfgn  5 Units Subcutaneous Daily   midodrine  10 mg Per Tube TID WC   mirtazapine  7.5 mg Per Tube QHS   multivitamin  1 tablet Per Tube QHS   pantoprazole sodium  40 mg Per Tube Daily   sodium chloride flush  3 mL Intravenous Q12H   sodium zirconium cyclosilicate  10 g Per Tube Daily   thiamine  100 mg Per Tube Daily   venlafaxine  37.5 mg Per Tube BID

## 2021-06-17 DIAGNOSIS — K922 Gastrointestinal hemorrhage, unspecified: Secondary | ICD-10-CM | POA: Diagnosis not present

## 2021-06-17 LAB — RENAL FUNCTION PANEL
Albumin: 2.2 g/dL — ABNORMAL LOW (ref 3.5–5.0)
Anion gap: 9 (ref 5–15)
BUN: 20 mg/dL (ref 8–23)
CO2: 28 mmol/L (ref 22–32)
Calcium: 7.5 mg/dL — ABNORMAL LOW (ref 8.9–10.3)
Chloride: 97 mmol/L — ABNORMAL LOW (ref 98–111)
Creatinine, Ser: 3.42 mg/dL — ABNORMAL HIGH (ref 0.44–1.00)
GFR, Estimated: 14 mL/min — ABNORMAL LOW (ref >60)
Glucose, Bld: 127 mg/dL — ABNORMAL HIGH (ref 70–99)
Phosphorus: 2.4 mg/dL — ABNORMAL LOW (ref 2.5–4.6)
Potassium: 4.3 mmol/L (ref 3.5–5.1)
Sodium: 134 mmol/L — ABNORMAL LOW (ref 135–145)

## 2021-06-17 LAB — GLUCOSE, CAPILLARY
Glucose-Capillary: 128 mg/dL — ABNORMAL HIGH (ref 70–99)
Glucose-Capillary: 168 mg/dL — ABNORMAL HIGH (ref 70–99)
Glucose-Capillary: 248 mg/dL — ABNORMAL HIGH (ref 70–99)
Glucose-Capillary: 299 mg/dL — ABNORMAL HIGH (ref 70–99)
Glucose-Capillary: 314 mg/dL — ABNORMAL HIGH (ref 70–99)

## 2021-06-17 NOTE — Progress Notes (Signed)
Subjective: HD tolerated yesterday cannulating AV Gore-Tex graft dialysis /PermCath not needed.  Eyes open but no interaction today  Objective Vital signs in last 24 hours: Vitals:   06/16/21 2125 06/17/21 0434 06/17/21 0658 06/17/21 1006  BP:  (!) 78/67 123/74 (!) 114/55  Pulse: (!) 109 (!) 101 96 97  Resp:  18  16  Temp:  98 F (36.7 C)  98.4 F (36.9 C)  TempSrc:  Oral  Axillary  SpO2: 95% 97%  96%  Weight:      Height:       Weight change:  Physical Exam: General: Lethargic chronically ill-appearing obese female in NAD Heart: Tacky rate but regular, no MRG Lungs: CTA anteriorly nonlabored breathing Abdomen: Obese NAB S Extremities: Trace bipedal edema improved from yesterday Dialysis Access: Positive bruit left upper arm AVG, swelling noted left upper arm to the fingers with contracture distal to AVG(swelling improved from yesterday)   Dialysis Orders: AF TTS here 3h 9mn 400/500    62.5kg    3K/2.5 Ca    P2  AVG HeRO   Hep none - Mircera 50 q 2 wks  (7/13)  Venofer 50 q wk - Parsabiv 512mIV q HDlower extremity edema, left upper arm swelling, nontender dialysis Access: Left AVG    Problem/Plan: AMS/FTT: Providers and palliative care met w/ family and discussed pt's decline. Due to declining mental and physical status we don't anticipate she will be able to do OP dialysis.  Pt will have to be able to sit in a chair for 4-6 hrs to qualify for OP HD w/ our group. If she is able to sit for dialysis and cooperate, then the next step would to re-CLIP the patient as her OP HD unit will not take her back.  Trial of recliner dialysis this week.  2.   Melena/Rectal bleeding - GI consulted. Hx rectal ulcers.  EGD 7/27 normal / flex sig =mucosal ulceration c/w sterocoral ulcers, non bleeding.  Hgb stable 8.6 ESRD: HD TTS in hospital (usual MWF outpatient).  Dialysis yesterday Monday tolerated no access problem, still yesterday because of Saturday access problems cannulating/had  infiltration.  Thus we will continue dialysis in hospital MWF Hyperkalemia-K+ 4.3 this a.m. on daily lokelma. 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 Contracture distal to AVG Hypertension/volume  - BP soft. On midodrine for BP support.  Now has some lower extremity edema UF as tolerated.  Under edw if weights correct. Nutrition: Albumin low. Continue protein supps.  TF per RD once Cortrak in place.  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  Anemia  - Hgb 7.4 Aranesp 10083mIV q Saturday. Increased dose for 06/14/21 Dispo: DNR/DNI. Poor prognosis. Palliative care following to establish goals of care. Appreciate their involvement. We do not belive dialysis is adding to her quality of life. Per last note, family wishes to continue dialysis for now. At this point does not appear suitable for outpatient dialysis   DavErnest HaberA-Bour[facility] Hospital[facility] Beeper [phone]/2022,11:53 AM  LOS: 28 days   Labs: Basic Metabolic Panel: Recent Labs  Lab 06/14/21 1136 06/15/21 0417 06/15/21 0729 06/16/21 0323 06/17/21 0129  NA 131* 132*  --  130* 134*  K 4.6 5.5* 5.5* 5.4* 4.3  CL 96* 96*  --  94* 97*  CO2 28 30  --  29 28  GLUCOSE 270* 245*  --  66* 127*  BUN  17 23  --  32* 20  CREATININE 4.13* 4.54*  --  4.93* 3.42*  CALCIUM 7.5* 7.7*  --  7.8* 7.5*  PHOS 1.1*  --   --  <1.0* 2.4*   Liver Function Tests: Recent Labs  Lab 06/14/21 1136 06/16/21 0323 06/17/21 0129  ALBUMIN 1.9* 1.9* 2.2*   No results for input(s): LIPASE, AMYLASE in the last 168 hours. No results for input(s): AMMONIA in the last 168 hours. CBC: Recent Labs  Lab 06/11/21 0537 06/12/21 0640 06/14/21 1136 06/15/21 0417 06/16/21 0323  WBC 7.6  --  13.7*  --   --   NEUTROABS 5.4  --   --   --   --   HGB 7.4*   < > 7.1* 7.1* 7.4*  HCT 23.1*   < > 22.4* 22.9* 23.1*  MCV 94.3  --  95.3  --   --   PLT 191  --  152  --    --    < > = values in this interval not displayed.   Cardiac Enzymes: No results for input(s): CKTOTAL, CKMB, CKMBINDEX, TROPONINI in the last 168 hours. CBG: Recent Labs  Lab 06/16/21 2112 06/16/21 2124 06/16/21 2236 06/17/21 0420 06/17/21 1142  GLUCAP 59* 44* 97 168* 248*    Studies/Results: No results found. Medications:  sodium chloride     sodium chloride     sodium chloride     feeding supplement (OSMOLITE 1.5 CAL) 50 mL/hr at 06/17/21 0900    apixaban  2.5 mg Per Tube BID   atorvastatin  40 mg Per Tube Daily   Chlorhexidine Gluconate Cloth  6 each Topical Q0600   collagenase   Topical Daily   darbepoetin (ARANESP) injection - DIALYSIS  150 mcg Intravenous Q Mon-HD   feeding supplement (PROSource TF)  45 mL Per Tube Daily   gabapentin  100 mg Per Tube QHS   insulin aspart  0-15 Units Subcutaneous Q4H   midodrine  10 mg Per Tube TID WC   mirtazapine  7.5 mg Per Tube QHS   multivitamin  1 tablet Per Tube QHS   pantoprazole sodium  40 mg Per Tube Daily   sodium chloride flush  3 mL Intravenous Q12H   sodium zirconium cyclosilicate  10 g Per Tube Daily   thiamine  100 mg Per Tube Daily   venlafaxine  37.5 mg Per Tube BID

## 2021-06-17 NOTE — Progress Notes (Addendum)
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  On presentation, hemoglobin was 9.6; GI was consulted.  She underwent EGD which was normal and flexible sigmoidoscopy which showed nonbleeding distal rectal ulcers/stercoral ulcers.  Subsequently, GI signed off. Neurology was also consulted for altered mental status; MRI brain was similar to prior MRI [facility] with a large right MCA stroke.  Nephrology following for dialysis needs; nephrology is stating that patient will not be accepted back to outpatient dialysis at this time due to her underlying confusion, weakness and unsafe to sit in a dialysis recliner as an outpatient.  Due to her poor oral intake, IR was consulted for possible PEG tube placement but IR was unable to place because of overlying transverse colon.  General surgery was consulted for G-tube placement but family currently undecided.  Palliative care following for goals of care discussion.   Assessment & Plan:   Principal Problem:   GI bleed Active Problems:   Essential hypertension   Type 2 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, with long-term current use of insulin (HCC)   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   ESRD on dialysis ([place])   Dyslipidemia   Hypokalemia   Stage 2 skin ulcer of sacral region [facility])   Cerebral thrombosis with cerebral infarction   Protein-calorie malnutrition, severe   GI bleed  secondary to rectal ulcers: Patient presented with anemia,  underwent EGD which was normal and flex sigmoidoscopy with clean base, nonbleeding distal rectal ulcers (stercoral ulcers). Gastroenterology now signed off on 05/22/21. Hemoglobin remains stable. / No further reports of bleeding during this hospitalization.     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  EEG with cortical dysfunction right hemisphere likely secondary to underlying structural abnormality/stroke and moderate encephalopathy with no seizure activity or epileptiform discharges.  Hemoglobin A1c 5.1, LDL 68.  No further recommendations per neurology and signed off 05/28/2021. Continue atorvastatin 40 mg p.o. daily Diet as per SLP recommendations   Hyperlipidemia : Continue statin.   Type 2 diabetes mellitus with episodes of hypoglycemia Hemoglobin A1c 5.1.  Diet controlled at baseline. Patient has had episodes of hypoglycemia due to poor oral intake.  She was started on D5 drip on 06/05/2021.  Monitor CBGs.   ESRD on HD Per Nephro Patient is not a candidate for continued outpatient HD given her confusion, weakness and inability to sit up in a chair for HD outpatient.  Overall HD is not enhancing her quality of life at this time. Continue on HD while inpatient. At this point she doesn't appear suitable for outpatient dialysis. Nephrology is recommending consideration of transition to hospice.   As per palliative care discussion with family on 06/05/2021:  Family wants to continue to treat the treatable with hopes for improvement and to continue hemodialysis as long as patient can  tolerate. Patient will need either LTAC placement or another outpatient hemodialysis unit needs to be found. Unfortunately she did not have hemodialysis 8/20 due to problem with AV fistula.  Hemodialysis completed through AV cannulated graft next day.   Hyperkalemia: Resolved with dialysis.   Chronic thrombus left upper extremity. Continue Eliquis    Hypotension: Blood pressure currently is stable.  Continue midodrine   Stage II mid coccyx pressure injury, present on admission Continue wound care   Acute metabolic encephalopathy,  failure to thrive /Generalized deconditioning  /very poor oral intake Goals of care discussion. Neurology has already signed off.  Overall prognosis is very poor. Due to her poor oral intake, IR was consulted for PEG placement; unfortunately unable to place on 8/4 due to overlying transverse colon.   General surgery was consulted on 8/5 for surgical G-tube placement but family was not decided at that time. If condition does not improve, recommend total comfort measures. Oral intake remains very poor General surgery was reconsulted on 06/09/2021 for possible G-tube placement: After general surgery discussion with family, family has agreed with cortrak placement and initiation of feeding through the tube.  Dietitian has been reconsulted. Palliative care is on the board.  Needs to discuss goals of care.   Hyponatremia. Being managed by nephrology by dialysis.   Urinary tract infection Completed 3-day course of ceftriaxone   Hepatic hypodensity Incidental finding of an ovoid hepatic hypodensity anterior aspect of liver measuring up to 4.9 cm, could represent mass versus focal fatty infiltration.   Will need outpatient follow-up with MRI liver for further evaluation if goals of care remain aggressive.   Leukocytosis Resolved.  GERD Continue PPI     DVT prophylaxis: Eliquis Code Status: DNR Family Communication: No family at bed side. Disposition  Plan:    Status is: Inpatient  Remains inpatient appropriate because:Inpatient level of care appropriate due to severity of illness  Dispo: The patient is from: SNF              Anticipated d/c is to: SNF              Patient currently is not medically stable to d/c.   Difficult to place patient No  Consultants:  Neurology, palliative care, general surgery, IR, GI  Procedures: EEG 8/1: Suggestive of cortical dysfunction right hemisphere likely secondary to underlying structural abnormality/stroke, moderate encephalopathy, no seizure activity or epileptiform discharges. EGD 7/27, normal exam Flex sigmoidoscopy 7/27: 4 nonbleeding ulcers  Antimicrobials:   Anti-infectives (From admission, onward)    Start     Dose/Rate Route Frequency Ordered Stop   05/29/21 1622  ceFAZolin (ANCEF) 2-4 GM/100ML-% IVPB       Note to Pharmacy: Lytle Butte   : cabinet override      05/29/21 1622 05/30/21 0429   05/29/21 0000  cefTRIAXone (ROCEPHIN) 1 g in sodium chloride 0.9 % 100 mL IVPB  Status:  Discontinued        1 g 200 mL/hr over 30 Minutes Intravenous Every 24 hours 05/28/21 1801 05/28/21 1801   05/28/21 1830  cefTRIAXone (ROCEPHIN) 1 g in sodium chloride 0.9 % 100 mL IVPB        1 g 200 mL/hr over 30 Minutes Intravenous Every 24 hours 05/28/21 1801 05/30/21 1907   05/28/21 1630  ceFAZolin (ANCEF) IVPB 2g/100 mL premix  2 g 200 mL/hr over 30 Minutes Intravenous To Radiology 05/28/21 1537 05/29/21 1630        Subjective: Patient was seen and examined at bedside.  Overnight events noted. Patient is alert, arousable, opens eyes but does not follow commands.She has Cortrek for nutritional support.   She appears chronically ill looking and very deconditioned.    Objective: Vitals:   06/16/21 2125 06/17/21 0434 06/17/21 0658 06/17/21 1006  BP:  (!) 78/67 123/74 (!) 114/55  Pulse: (!) 109 (!) 101 96 97  Resp:  18  16  Temp:  98 F (36.7 C)  98.4 F (36.9 C)  TempSrc:  Oral   Axillary  SpO2: 95% 97%  96%  Weight:      Height:        Intake/Output Summary (Last 24 hours) at 06/17/2021 1443 Last data filed at 06/17/2021 1300 Gross per 24 hour  Intake 240 ml  Output 500 ml  Net -260 ml   Filed Weights   06/11/21 0446 06/16/21 1553 06/16/21 1941  Weight: 60 kg 62.4 kg 62 kg    Examination:  General exam: Chronically ill looking and deconditioned, lying comfortably.  Cortrek noted. Respiratory system: Decreased breath sounds bilaterally.  RR 17 Cardiovascular system: S1-S2 normal, regular rate and rhythm, no murmur Gastrointestinal system: Abdomen is soft, mildly distended, nontender, bowel sounds positive  Central nervous system: Alert, arousable, left-sided weakness present. Extremities: Left arm swelling noted.  Swelling noted over AV graft, contracture distal to AVG Skin: No rashes, lesions or ulcers Psychiatry: not assessed.    Data Reviewed: I have personally reviewed following labs and imaging studies  CBC: Recent Labs  Lab 06/11/21 0537 06/12/21 0640 06/14/21 1136 06/15/21 0417 06/16/21 0323  WBC 7.6  --  13.7*  --   --   NEUTROABS 5.4  --   --   --   --   HGB 7.4* 7.4* 7.1* 7.1* 7.4*  HCT 23.1* 22.5* 22.4* 22.9* 23.1*  MCV 94.3  --  95.3  --   --   PLT 191  --  152  --   --    Basic Metabolic Panel: Recent Labs  Lab 06/11/21 0537 06/11/21 1653 06/12/21 0640 06/14/21 1136 06/15/21 0417 06/15/21 0729 06/16/21 0323 06/17/21 0129  NA 128*  --  124* 131* 132*  --  130* 134*  K 4.2  --  3.8 4.6 5.5* 5.5* 5.4* 4.3  CL 92*  --  89* 96* 96*  --  94* 97*  CO2 27  --  '27 28 30  '$ --  29 28  GLUCOSE 131*  --  205* 270* 245*  --  66* 127*  BUN 7*  --  '9 17 23  '$ --  32* 20  CREATININE 3.69*  --  4.43* 4.13* 4.54*  --  4.93* 3.42*  CALCIUM 8.2*  --  7.8* 7.5* 7.7*  --  7.8* 7.5*  MG 1.7 1.7 1.7  --   --   --   --   --   PHOS 2.5 2.5 2.2* 1.1*  --   --  <1.0* 2.4*   GFR: Estimated Creatinine Clearance: 14.2 mL/min (A) (by C-G formula  based on SCr of 3.42 mg/dL (H)). Liver Function Tests: Recent Labs  Lab 06/14/21 1136 06/16/21 0323 06/17/21 0129  ALBUMIN 1.9* 1.9* 2.2*   No results for input(s): LIPASE, AMYLASE in the last 168 hours. No results for input(s): AMMONIA in the last 168 hours. Coagulation Profile: No results for input(s):  INR, PROTIME in the last 168 hours. Cardiac Enzymes: No results for input(s): CKTOTAL, CKMB, CKMBINDEX, TROPONINI in the last 168 hours. BNP (last 3 results) No results for input(s): PROBNP in the last 8760 hours. HbA1C: No results for input(s): HGBA1C in the last 72 hours. CBG: Recent Labs  Lab 06/16/21 2112 06/16/21 2124 06/16/21 2236 06/17/21 0420 06/17/21 1142  GLUCAP 59* 44* 97 168* 248*   Lipid Profile: No results for input(s): CHOL, HDL, LDLCALC, TRIG, CHOLHDL, LDLDIRECT in the last 72 hours. Thyroid Function Tests: No results for input(s): TSH, T4TOTAL, FREET4, T3FREE, THYROIDAB in the last 72 hours. Anemia Panel: No results for input(s): VITAMINB12, FOLATE, FERRITIN, TIBC, IRON, RETICCTPCT in the last 72 hours. Sepsis Labs: No results for input(s): PROCALCITON, LATICACIDVEN in the last 168 hours.  No results found for this or any previous visit (from the past 240 hour(s)).   Radiology Studies: No results found.   Scheduled Meds:  apixaban  2.5 mg Per Tube BID   atorvastatin  40 mg Per Tube Daily   Chlorhexidine Gluconate Cloth  6 each Topical Q0600   collagenase   Topical Daily   darbepoetin (ARANESP) injection - DIALYSIS  150 mcg Intravenous Q Mon-HD   feeding supplement (PROSource TF)  45 mL Per Tube Daily   gabapentin  100 mg Per Tube QHS   insulin aspart  0-15 Units Subcutaneous Q4H   midodrine  10 mg Per Tube TID WC   mirtazapine  7.5 mg Per Tube QHS   multivitamin  1 tablet Per Tube QHS   pantoprazole sodium  40 mg Per Tube Daily   sodium chloride flush  3 mL Intravenous Q12H   thiamine  100 mg Per Tube Daily   venlafaxine  37.5 mg Per Tube  BID   Continuous Infusions:  sodium chloride     sodium chloride     sodium chloride     feeding supplement (OSMOLITE 1.5 CAL) 50 mL/hr at 06/17/21 0900     LOS: 28 days    Time spent: 25 mins    Yukie Bergeron, MD [facility]   If 7PM-7AM, please contact night-coverage

## 2021-06-17 NOTE — Progress Notes (Signed)
Nutrition Follow-up  DOCUMENTATION CODES:  Severe malnutrition in context of chronic illness  INTERVENTION:  Continue TF via Cortrak: -Osmolite 1.5 @ 57m/hr (12049md)  -4524mrosource TF daily  Provides 1840 kcals, 86g protein, 914m57mee water  NUTRITION DIAGNOSIS:  Severe Malnutrition related to chronic illness (ESRD on HD, prior CVA) as evidenced by percent weight loss, severe muscle depletion, energy intake < or equal to 75% for > or equal to 1 month. 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  8/17 - cortrak placed (gastric tip) 8/19 - cortrak replaced (gastric tip)  Per Nephrology, pt is not suitable for outpatient dialysis and is recommending consideration of transition to hospice. Family wishes to continue attempting treatment. Nephrology recommending [facility] next HD.  Pt is arousable but does not follow commands. Continues to be dependent on Cortrak for TF given ongoing inadequate po intake. Tolerating TF without issues per RN. Current regimen: Osmolite 1.5 @ 50ml66m(1200ml/62m/ 45ml P58murce TF daily. Recommend continued discussion regarding GOC as pt likely appropriate for transition to comfort based care.   PO intake: 0% x last 8 recorded meals   Medications: Scheduled Meds:  apixaban  2.5 mg Per Tube BID   atorvastatin  40 mg Per Tube Daily   Chlorhexidine Gluconate Cloth  6 each Topical Q0600   collagenase   Topical Daily   darbepoetin (ARANESP) injection - DIALYSIS  150 mcg Intravenous Q Mon-HD   feeding supplement (PROSource TF)  45 mL Per  Tube Daily   gabapentin  100 mg Per Tube QHS   insulin aspart  0-15 Units Subcutaneous Q4H   midodrine  10 mg Per Tube TID WC   mirtazapine  7.5 mg Per Tube QHS   multivitamin  1 tablet Per Tube QHS   pantoprazole sodium  40 mg Per Tube Daily   sodium chloride flush  3 mL Intravenous Q12H   sodium zirconium cyclosilicate  10 g Per Tube Daily   thiamine  100 mg Per Tube Daily   venlafaxine  37.5 mg Per Tube BID  Continuous Infusions:  sodium chloride     sodium chloride     sodium chloride     feeding supplement (OSMOLITE 1.5 CAL) 50 mL/hr at 06/17/21 0900   Labs: Recent Labs  Lab 06/11/21 0537 06/11/21 1653 06/12/21 0640 06/14/21 1136 06/15/21 0417 06/15/21 0729 06/16/21 0323 06/17/21 0129  NA 128*  --  124* 131* 132*  --  130* 134*  K 4.2  --  3.8 4.6 5.5* 5.5* 5.4* 4.3  CL 92*  --  89* 96* 96*  --  94* 97*  CO2 27  --  27 28 30   --  29 28  BUN 7*  --  9 17 23   --  32* 20  CREATININE 3.69*  --  4.43* 4.13* 4.54*  --  4.93* 3.42*  CALCIUM 8.2*  --  7.8* 7.5* 7.7*  --  7.8* 7.5*  MG 1.7 1.7 1.7  --   --   --   --   --  PHOS 2.5 2.5 2.2* 1.1*  --   --  <1.0* 2.4*  GLUCOSE 131*  --  205* 270* 245*  --  66* 127*  CBGs [phone] (Diabetes Coordinator following)  No UOP documented x24 hours  Last HD 8/22, 555m net UF I/O: +9.3L since admit  Pt is below EDW  EDW 62.5 kg Current weight: 62 kg Admission weight: 58.1 kg  RN edema assessment notes the following: -Generalized moderate pitting edema -Deep pitting edema to RUE -Moderate pitting edema to LUE and face -Mild pitting edema to BLE  Diet Order:   Diet Order             DIET DYS 3 Room service appropriate? Yes; Fluid consistency: Thin  Diet effective now                  EDUCATION NEEDS:  Education needs have been addressed  Skin:  Skin Assessment: Skin Integrity Issues: Skin Integrity Issues:: Stage II Stage II: Pressure Injury on coccyx; Open wound on buttocks  Last BM:  8/23 type  7  Height:  Ht Readings from Last 1 Encounters:  05/20/21 5' 7"  (1.702 m)   Weight:  Wt Readings from Last 1 Encounters:  06/16/21 62 kg   BMI:  Body mass index is 21.41 kg/m.  Estimated Nutritional Needs:  Kcal:  1700-1900 Protein:  85-100 grams Fluid:  1L +UOP    ALarkin Ina MS, RD, LDN (she/her/hers) RD pager number and weekend/on-call pager number located in [phone]

## 2021-06-17 NOTE — Plan of Care (Signed)
  Problem: Nutrition: Goal: Adequate nutrition will be maintained Outcome: Progressing   Problem: Skin Integrity: Goal: Risk for impaired skin integrity will decrease Outcome: Progressing   

## 2021-06-17 NOTE — Progress Notes (Signed)
Inpatient Diabetes Program Recommendations  AACE/ADA: New Consensus Statement on Inpatient Glycemic Control   Target Ranges:  Prepandial:   less than 140 mg/dL      Peak postprandial:   less than 180 mg/dL (1-2 hours)      Critically ill patients:  140 - 180 mg/dL   Results for Mcinerny, CONCETTINA KRUPNICK (MRN [id number]) as of 06/17/2021 10:57  Ref. Range 06/16/2021 08:00 06/16/2021 08:41 06/16/2021 11:55 06/16/2021 12:50 06/16/2021 21:12 06/16/2021 21:24 06/16/2021 22:36 06/17/2021 04:20  Glucose-Capillary Latest Ref Range: 70 - 99 mg/dL 53 (L) 100 (H) 63 (L) 115 (H) 59 (L) 44 (LL) 97 168 (H)   Review of Glycemic Control  Diabetes history: DM2 Outpatient Diabetes medications: None; diet controlled Current orders for Inpatient glycemic control: Semglee 5 units daily, Novolog 0-15 units Q4H; Osmolite @ 50 ml/hr   Inpatient Diabetes Program Recommendations:     Insulin: Please consider discontinuing Semglee.   NOTE: In reviewing chart, noted hypoglycemia 3 times on 06/16/21. Patient has already received Semglee 5 units today. Would recommend discontinuing Semglee.   Thanks, Barnie Alderman, RN, MSN, CDE Diabetes Coordinator Inpatient Diabetes Program [phone] (Team Pager from 8am to 5pm)

## 2021-06-18 DIAGNOSIS — K922 Gastrointestinal hemorrhage, unspecified: Secondary | ICD-10-CM | POA: Diagnosis not present

## 2021-06-18 LAB — RENAL FUNCTION PANEL
Albumin: 2.1 g/dL — ABNORMAL LOW (ref 3.5–5.0)
Anion gap: 7 (ref 5–15)
BUN: 31 mg/dL — ABNORMAL HIGH (ref 8–23)
CO2: 31 mmol/L (ref 22–32)
Calcium: 7.8 mg/dL — ABNORMAL LOW (ref 8.9–10.3)
Chloride: 96 mmol/L — ABNORMAL LOW (ref 98–111)
Creatinine, Ser: 4.05 mg/dL — ABNORMAL HIGH (ref 0.44–1.00)
GFR, Estimated: 11 mL/min — ABNORMAL LOW (ref >60)
Glucose, Bld: 119 mg/dL — ABNORMAL HIGH (ref 70–99)
Phosphorus: 1.8 mg/dL — ABNORMAL LOW (ref 2.5–4.6)
Potassium: 4.3 mmol/L (ref 3.5–5.1)
Sodium: 134 mmol/L — ABNORMAL LOW (ref 135–145)

## 2021-06-18 LAB — GLUCOSE, CAPILLARY
Glucose-Capillary: 67 mg/dL — ABNORMAL LOW (ref 70–99)
Glucose-Capillary: 94 mg/dL (ref 70–99)
Glucose-Capillary: 131 mg/dL — ABNORMAL HIGH (ref 70–99)
Glucose-Capillary: 126 mg/dL — ABNORMAL HIGH (ref 70–99)
Glucose-Capillary: 153 mg/dL — ABNORMAL HIGH (ref 70–99)

## 2021-06-18 MED ORDER — DEXTROSE 50 % IV SOLN
12.5000 g | INTRAVENOUS | Status: AC
  Administered 2021-06-18: 12.5 g via INTRAVENOUS

## 2021-06-18 MED ORDER — DEXTROSE 50 % IV SOLN
INTRAVENOUS | Status: AC
  Filled 2021-06-18: qty 50

## 2021-06-18 NOTE — Progress Notes (Signed)
PROGRESS NOTE  Amy [name]  DOB: 10/24/1946  PCP: [name], MD [id number]  DOA: 05/19/2021  LOS: 52 days  Hospital Day: 31   Chief Complaint  Patient presents with   Rectal Bleeding    Brief narrative: Amy [name] is a 75 y.o. 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  Patient presented to the ED on 7/25 with dark stool.  On presentation, hemoglobin was 9.6; GI was consulted.  She underwent EGD which was normal and flexible sigmoidoscopy which showed nonbleeding distal rectal ulcers/stercoral ulcers.  Subsequently, GI signed off. Neurology was also consulted for altered mental status; MRI brain was similar to prior MRI at [facility] with a large right MCA stroke.   Nephrology has been following for dialysis needs; nephrology is stating that patient will not be accepted back to outpatient dialysis at this time due to her underlying confusion, weakness and inability to sit in a dialysis recliner as an outpatient.   Due to her poor oral intake, family wanted a PEG tube placement.  IR tried but was unable to place because of overlying transverse colon. General surgery was consulted for G-tube placement but family currently undecided.  She has an NG tube in place.   Palliative care following for goals of care discussion.   Subjective: Patient was seen and examined this morning.  Pleasant elderly African-American female.  Alert, awake, has a NG feeding tube in place.  Assessment/Plan: Acute on chronic GI bleeding from stercoral ulcers -Patient presented with anemia,  underwent EGD which was normal and flex sigmoidoscopy with clean base, nonbleeding distal rectal  ulcers (stercoral ulcers). Gastroenterology now signed off on 05/22/21. -Hemoglobin remains stable between 7 and 8.  No active bleeding at this time. 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  EEG with cortical dysfunction right hemisphere likely secondary to underlying structural abnormality/stroke and moderate encephalopathy with no seizure activity or epileptiform discharges.  Hemoglobin A1c 5.1, LDL 68.  No further recommendations per neurology and signed off 05/28/2021. Continue atorvastatin 40 mg p.o. daily Diet as per SLP recommendations    ESRD on HD -Per Nephro Patient is not a candidate for continued outpatient HD given her confusion, weakness and inability to sit up in a chair for HD outpatient.  -Nephrology suggested transition to hospice care. -As per palliative care discussion with family on 06/05/2021: Family wanted to continue to treat the treatable with hopes for improvement and to continue hemodialysis as long as patient can tolerate. Patient will need either LTAC placement or another outpatient hemodialysis unit needs to be found.  Hypotension: Blood pressure currently is stable.  Continue midodrine   failure to thrive /  Generalized deconditioning  /very poor oral intake -PEG tube was desired by family but could not be placed because of overlying transverse colon.   -Currently Cortrak  feeding. -Ongoing discussion of goals of care.  Type 2 diabetes mellitus Episodes of hypoglycemia -Hemoglobin A1c 5.1.  Diet controlled at baseline. -Episodes of hypoglycemia for which she required dextrose drip briefly.  Currently receiving tube feeding.    Chronic thrombus left upper extremity. Continue Eliquis   Stage II mid coccyx pressure injury, present on admission Continue wound care    GERD Continue PPI   Mobility: Poor mobility Code Status:   Code Status: DNR  Nutritional status: Body mass index is 21.41 kg/m. Nutrition Problem: Severe Malnutrition Etiology: chronic illness (ESRD on HD, prior CVA) Signs/Symptoms: percent weight loss, severe muscle depletion, energy intake < or equal to 75% for > or equal to 1 month Percent weight loss: 16.4 % Diet:  Diet Order             DIET DYS 3 Room service appropriate? Yes; Fluid consistency: Thin  Diet effective now                  DVT prophylaxis:  apixaban (ELIQUIS) tablet 2.5 mg Start: 06/15/21 1115 Place TED hose Start: 05/19/21 1706 apixaban (ELIQUIS) tablet 2.5 mg   Antimicrobials: None at this time Fluid: None Consultants: Nephrology, palliative care Family Communication: Called and updated patient's goddaughter Ms. Joelene Millin this afternoon.  Status is: Inpatient  Remains inpatient appropriate because: Poor prognosis.  Unable to do outpatient dialysis.  Dispo: The patient is from: Home              Anticipated d/c is to: Unclear at this time              Patient currently is not medically stable to d/c.   Difficult to place patient No     Infusions:   sodium chloride     sodium chloride     sodium chloride     feeding supplement (OSMOLITE 1.5 CAL) 1,000 mL (06/18/21 1554)    Scheduled Meds:  apixaban  2.5 mg Per Tube BID   atorvastatin  40 mg Per Tube Daily   Chlorhexidine Gluconate Cloth  6 each Topical Q0600   collagenase   Topical Daily   darbepoetin (ARANESP) injection - DIALYSIS  150  mcg Intravenous Q Mon-HD   feeding supplement (PROSource TF)  45 mL Per Tube Daily   gabapentin  100 mg Per Tube QHS   insulin aspart  0-15 Units Subcutaneous Q4H   midodrine  10 mg Per Tube TID WC   mirtazapine  7.5 mg Per Tube QHS   multivitamin  1 tablet Per Tube QHS   pantoprazole sodium  40 mg Per Tube Daily   sodium chloride flush  3 mL Intravenous Q12H   thiamine  100 mg Per Tube Daily   venlafaxine  37.5 mg Per Tube BID    Antimicrobials: Anti-infectives (From admission, onward)    Start     Dose/Rate Route Frequency Ordered Stop   05/29/21 1622  ceFAZolin (ANCEF) 2-4 GM/100ML-% IVPB       Note to Pharmacy: Lytle Butte   : cabinet override      05/29/21 1622 05/30/21 0429   05/29/21 0000  cefTRIAXone (ROCEPHIN) 1 g in sodium chloride 0.9 % 100 mL IVPB  Status:  Discontinued        1 g 200 mL/hr over 30 Minutes Intravenous Every 24 hours 05/28/21  1801 05/28/21 1801   05/28/21 1830  cefTRIAXone (ROCEPHIN) 1 g in sodium chloride 0.9 % 100 mL IVPB        1 g 200 mL/hr over 30 Minutes Intravenous Every 24 hours 05/28/21 1801 05/30/21 1907   05/28/21 1630  ceFAZolin (ANCEF) IVPB 2g/100 mL premix        2 g 200 mL/hr over 30 Minutes Intravenous To Radiology 05/28/21 1537 05/29/21 1630       PRN meds: sodium chloride, sodium chloride, sodium chloride, acetaminophen, fentaNYL, HYDROmorphone (DILAUDID) injection, lidocaine (PF), lidocaine-prilocaine, midazolam, ondansetron (ZOFRAN) IV, oxyCODONE, pentafluoroprop-tetrafluoroeth, sodium chloride flush   Objective: Vitals:   06/18/21 0335 06/18/21 1003  BP: (!) 121/46 139/63  Pulse: (!) 103 (!) 105  Resp: 18 16  Temp: 98.5 F (36.9 C) 98.9 F (37.2 C)  SpO2: 97% 100%    Intake/Output Summary (Last 24 hours) at 06/18/2021 1659 Last data filed at 06/18/2021 1323 Gross per 24 hour  Intake 500 ml  Output 0 ml  Net 500 ml   Filed Weights   06/11/21 0446 06/16/21 1553 06/16/21 1941  Weight: 60 kg 62.4 kg 62 kg    Weight change:  Body mass index is 21.41 kg/m.   Physical Exam: General exam: Elderly African-American female.  Not in distress Skin: No rashes, lesions or ulcers. HEENT: Atraumatic, normocephalic, no obvious bleeding.  NG tube feeding in place Lungs: Clear to auscultation bilaterally CVS: Regular rate and rhythm, no murmur GI/Abd soft, nontender, nondistended, bowel sound present CNS: Alert, awake, not able to engage in a conversation because of previous stroke Psychiatry: Depressed look Extremities: No pedal edema, no calf tenderness  Data Review: I have personally reviewed the laboratory data and studies available.  Recent Labs  Lab 06/12/21 0640 06/14/21 1136 06/15/21 0417 06/16/21 0323  WBC  --  13.7*  --   --   HGB 7.4* 7.1* 7.1* 7.4*  HCT 22.5* 22.4* 22.9* 23.1*  MCV  --  95.3  --   --   PLT  --  152  --   --    Recent Labs  Lab 06/12/21 0640 06/14/21 1136 06/15/21 0417 06/15/21 0729 06/16/21 0323 06/17/21 0129 06/18/21 0427  NA 124* 131* 132*  --  130* 134* 134*  K 3.8 4.6 5.5* 5.5* 5.4* 4.3 4.3  CL 89* 96* 96*  --  94* 97* 96*  CO2 '27 28 30  '$ --  '29 28 31  '$ GLUCOSE 205* 270* 245*  --  66* 127* 119*  BUN '9 17 23  '$ --  32* 20 31*  CREATININE 4.43* 4.13* 4.54*  --  4.93* 3.42* 4.05*  CALCIUM 7.8* 7.5* 7.7*  --  7.8* 7.5* 7.8*  MG 1.7  --   --   --   --   --   --   PHOS 2.2* 1.1*  --   --  <1.0* 2.4* 1.8*    F/u labs ordered Unresulted Labs (From admission, onward)     Start     Ordered   06/16/21 0500  Renal function panel  Daily,   R     Question:  Specimen collection method  Answer:  Lab=Lab collect   06/15/21 1110   Signed and Held  CBC  Once,   R       Question:  Specimen collection method  Answer:  Lab=Lab collect   Signed and Held   Signed and Held  Renal function panel  Once,   R  Question:  Specimen collection method  Answer:  Lab=Lab collect   Signed and Held            Signed, Terrilee Croak, MD [facility] 06/18/2021

## 2021-06-18 NOTE — Progress Notes (Signed)
Subjective: Today eyes open mumbled speech for hemodialysis today  Objective Vital signs in last 24 hours: Vitals:   06/17/21 1827 06/17/21 2106 06/18/21 0335 06/18/21 1003  BP: (!) 133/55 (!) 130/52 (!) 121/46 139/63  Pulse: (!) 102 (!) 110 (!) 103 (!) 105  Resp: 15  18 16   Temp: 98.4 F (36.9 C) 99.5 F (37.5 C) 98.5 F (36.9 C) 98.9 F (37.2 C)  TempSrc: Axillary Oral Oral Axillary  SpO2: 96%  97% 100%  Weight:      Height:       Weight change:   Physical Exam: General: Lethargic chronically ill-appearing obese female in NAD Heart: Tacky rate but regular, no MRG Lungs: CTA anteriorly nonlabored breathing Abdomen: Obese NABS Extremities: Trace bipedal edema improved from yesterday, left arm contraction Dialysis Access: Positive bruit left upper arm AVG, swelling noted left upper arm to the fingers with contracture distal to AVG(swelling improved )   Dialysis Orders: AF MWF  3h 60mn 400/500    62.5kg    3K/2.5 Ca    P2  AVG HeRO   Hep none - Mircera 50 q 2 wks  (7/13)  Venofer 50 q wk - Parsabiv 566mIV q HDlower extremity edema, left upper arm swelling, nontender dialysis Access: Left AVG    Problem/Plan: AMS/FTT: Providers and palliative care met w/ family and discussed pt's decline. Due to declining mental and physical status we don't anticipate she will be able to do OP dialysis.  Pt will have to be able to sit in a chair for 4-6 hrs to qualify for OP HD w/ our group. If she is able to sit for dialysis and cooperate, then the next step would to re-CLIP the patient as her OP HD unit will not take her back.  Trial of recliner dialysis needed  2.   Melena/Rectal bleeding - GI consulted. Hx rectal ulcers.  EGD 7/27 normal / flex sig =mucosal ulceration c/w sterocoral ulcers, non bleeding.  Hgb 7.4<8.6 ESRD: HD  was TTS in hospital (usual MWF outpatient).  [place] MWF  [place]Had Saturday 8/20 access problems cannulating/had infiltration.  Last  HD Mon 8/22 tolerated ,no access problem with cannulation 400 bfr / IR consult for PC cancelled   Hyperkalemia-K+ 4.3 this a.m. on daily lokelma. 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 Contracture distal to AVG Hypertension/volume  - BP stable/soft. On midodrine for BP support.  Now has some lower extremity edema UF as tolerated.  Under edw if weights correct. Nutrition: Albumin 2.1  Continue protein supps.  TF per RD once Cortrak in place.  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  Anemia  - Hgb 7.4 Aranesp 10035mIV q Saturday. Increased dose to 150 for 06/14/21 /check iron studies Dispo: DNR/DNI. Poor prognosis. Palliative care following to establish goals of care. Appreciate their involvement. We do not belive dialysis is adding to her quality of life. Per last note, family wishes to continue dialysis for now. At this point does not appear suitable for outpatient dialysis   DavErnest HaberA-Potomac View Surgery Center [id number]/2022,1:52 PM  LOS: 29 days   Labs: Basic Metabolic Panel: Recent Labs  Lab 06/16/21 0323 06/17/21 0129 06/18/21 0427  NA 130* 134* 134*  K 5.4* 4.3 4.3  CL 94* 97* 96*  CO2 29 28 31   GLUCOSE 66* 127* 119*  BUN 32*  20 31*  CREATININE 4.93* 3.42* 4.05*  CALCIUM 7.8* 7.5* 7.8*  PHOS <1.0* 2.4* 1.8*   Liver Function Tests: Recent Labs  Lab 06/16/21 0323 06/17/21 0129 06/18/21 0427  ALBUMIN 1.9* 2.2* 2.1*   No results for input(s): LIPASE, AMYLASE in the last 168 hours. No results for input(s): AMMONIA in the last 168 hours. CBC: Recent Labs  Lab 06/14/21 1136 06/15/21 0417 06/16/21 0323  WBC 13.7*  --   --   HGB 7.1* 7.1* 7.4*  HCT 22.4* 22.9* 23.1*  MCV 95.3  --   --   PLT 152  --   --    Cardiac Enzymes: No results for input(s): CKTOTAL, CKMB, CKMBINDEX, TROPONINI in the last 168 hours. CBG: Recent Labs  Lab 06/17/21 2341  06/18/21 0340 06/18/21 0436 06/18/21 0730 06/18/21 1155  GLUCAP 128* 67* 94 131* 126*    Studies/Results: No results found. Medications:  sodium chloride     sodium chloride     sodium chloride     feeding supplement (OSMOLITE 1.5 CAL) 1,000 mL (06/17/21 2012)    apixaban  2.5 mg Per Tube BID   atorvastatin  40 mg Per Tube Daily   Chlorhexidine Gluconate Cloth  6 each Topical Q0600   collagenase   Topical Daily   darbepoetin (ARANESP) injection - DIALYSIS  150 mcg Intravenous Q Mon-HD   feeding supplement (PROSource TF)  45 mL Per Tube Daily   gabapentin  100 mg Per Tube QHS   insulin aspart  0-15 Units Subcutaneous Q4H   midodrine  10 mg Per Tube TID WC   mirtazapine  7.5 mg Per Tube QHS   multivitamin  1 tablet Per Tube QHS   pantoprazole sodium  40 mg Per Tube Daily   sodium chloride flush  3 mL Intravenous Q12H   thiamine  100 mg Per Tube Daily   venlafaxine  37.5 mg Per Tube BID

## 2021-06-18 NOTE — Progress Notes (Signed)
Hypoglycemic Event  CBG: 67  Treatment: D50 25 mL (12.5 gm)  Symptoms: None  Follow-up CBG: Time:0436 CBG Result:94  Possible Reasons for Event: Medication regimen:    Comments/MD notified:Per hypoglycemia protocol    Zahirah Cheslock Joselita

## 2021-06-18 NOTE — Progress Notes (Signed)
Pt off the unit to Hemodialysis

## 2021-06-19 DIAGNOSIS — K922 Gastrointestinal hemorrhage, unspecified: Secondary | ICD-10-CM | POA: Diagnosis not present

## 2021-06-19 LAB — RENAL FUNCTION PANEL
Albumin: 1.8 g/dL — ABNORMAL LOW (ref 3.5–5.0)
Anion gap: 7 (ref 5–15)
BUN: 28 mg/dL — ABNORMAL HIGH (ref 8–23)
CO2: 31 mmol/L (ref 22–32)
Calcium: 7.4 mg/dL — ABNORMAL LOW (ref 8.9–10.3)
Chloride: 95 mmol/L — ABNORMAL LOW (ref 98–111)
Creatinine, Ser: 3.61 mg/dL — ABNORMAL HIGH (ref 0.44–1.00)
GFR, Estimated: 13 mL/min — ABNORMAL LOW (ref >60)
Glucose, Bld: 189 mg/dL — ABNORMAL HIGH (ref 70–99)
Phosphorus: 1.9 mg/dL — ABNORMAL LOW (ref 2.5–4.6)
Potassium: 4.7 mmol/L (ref 3.5–5.1)
Sodium: 133 mmol/L — ABNORMAL LOW (ref 135–145)

## 2021-06-19 LAB — GLUCOSE, CAPILLARY
Glucose-Capillary: 182 mg/dL — ABNORMAL HIGH (ref 70–99)
Glucose-Capillary: 201 mg/dL — ABNORMAL HIGH (ref 70–99)
Glucose-Capillary: 140 mg/dL — ABNORMAL HIGH (ref 70–99)
Glucose-Capillary: 124 mg/dL — ABNORMAL HIGH (ref 70–99)

## 2021-06-19 MED ORDER — SODIUM PHOSPHATES 45 MMOLE/15ML IV SOLN
20.0000 mmol | Freq: Once | INTRAVENOUS | Status: AC
  Administered 2021-06-19: 20 mmol via INTRAVENOUS
  Filled 2021-06-19: qty 6.67

## 2021-06-19 NOTE — Progress Notes (Signed)
Subjective: Hemodialysis yesterday 2 L UF.  Tolerated, opens eyes to voice today actually more alert denies shortness of breath appears to agree to try it recliner dialysis tomorrow.  For nurse in room alerted to you needs Harrel Lemon lift to recliner  Objective Vital signs in last 24 hours: Vitals:   06/18/21 1925 06/18/21 2015 06/19/21 0400 06/19/21 0957  BP: 140/78 (!) 120/52 (!) 102/45 121/64  Pulse: (!) 110 (!) 108 97 95  Resp: _0 Temp: 98.7 F (37.1 C) 98 F (36.7 C) 98.7 F (37.1 C) 98 F (36.7 C)  TempSrc: Axillary  Oral   SpO2: 100%  91% 98%  Weight: 60 kg     Height:       Weight change:   Physical Exam: General:chronically ill-appearing obese female in NAD, lethargic but some more alert today Heart: RRR no MRG Lungs: CTA anteriorly nonlabored breathing Abdomen: Obese NABS, NTND Extremities: Trace bipedal edema improved from yesterday, left arm contraction Dialysis Access: Positive bruit left upper arm AVG, swelling noted left upper arm to the fingers with contracture distal to AVG(swelling improved )   Dialysis Orders: AF MWF  3h 53mn 400/500    62.5kg    3K/2.5 Ca    P2  AVG HeRO   Hep none - Mircera 50 q 2 wks  (7/13)  Venofer 50 q wk - Parsabiv 558mIV q HDlower extremity edema, left upper arm swelling, nontender dialysis Access: Left AVG    Problem/Plan: AMS/FTT: Providers and palliative care met w/ family and discussed pt's decline. Due to declining mental and physical status we don't anticipate she will be able to do OP dialysis.  Pt will have to be able to sit in a chair for 4-6 hrs to qualify for OP HD w/ our group. If she is able to sit for dialysis and cooperate, then the next step would to re-CLIP the patient as her OP HD unit will not take her back.  Trial of recliner dialysis needed = will try tomorrow 2.   Melena/Rectal bleeding - GI consulted. Hx rectal ulcers.  EGD 7/27 normal / flex sig =mucosal ulceration c/w sterocoral ulcers, non bleeding.  Hgb  7.4<8.6 ESRD: HD  was TTS in hospital (usual MWF outpatient).  [place] MWF  [place],Had Saturday 8/20 access problems cannulating/had infiltration.  No troubles since with good cannulator ,IR consult for PC cancelled 8/22 Hyperkalemia-K+ 4.7 this a.m. on daily lokelma. 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 Contracture distal to AVG Hypertension/volume  - BP stable/soft. On midodrine for BP support.  Now has some lower extremity edema UF as tolerated.  Under edw if weights correct. Nutrition: Albumin 2.1 > 1.8 continue protein supps.  TF per RD once Cortrak in place.  Also had food tray with pured food today Recent CVA with no evidence of meaningful recovery.  Anemia  - Hgb 7.4 Aranesp 150 mcg given 06/14/21 /check iron studies Dispo: DNR/DNI. Poor prognosis. Palliative care has seen see #1 Appreciate their involvement. We do not belive dialysis is adding to her quality of life. Per family communication  family wishes to continue dialysis for now. At this point does not appear suitable for outpatient dialysis  DaErnest HaberPA-C [id number]/25/2022,1:40 PM  LOS: 30 days   Labs: Basic Metabolic Panel: Recent Labs  Lab 06/17/21 0129 06/18/21 0427 06/19/21  0700  NA 134* 134* 133*  K 4.3 4.3 4.7  CL 97* 96* 95*  CO2 _0 GLUCOSE 127* 119* 189*  BUN 20 31* 28*  CREATININE 3.42* 4.05* 3.61*  CALCIUM 7.5* 7.8* 7.4*  PHOS 2.4* 1.8* 1.9*   Liver Function Tests: Recent Labs  Lab 06/17/21 0129 06/18/21 0427 06/19/21 0700  ALBUMIN 2.2* 2.1* 1.8*   No results for input(s): LIPASE, AMYLASE in the last 168 hours. No results for input(s): AMMONIA in the last 168 hours. CBC: Recent Labs  Lab 06/14/21 1136 06/15/21 0417 06/16/21 0323  WBC 13.7*  --   --   HGB 7.1* 7.1* 7.4*  HCT 22.4* 22.9* 23.1*  MCV 95.3  --   --   PLT 152  --   --     Cardiac Enzymes: No results for input(s): CKTOTAL, CKMB, CKMBINDEX, TROPONINI in the last 168 hours. CBG: Recent Labs  Lab 06/18/21 1155 06/18/21 2015 06/19/21 0356 06/19/21 0801 06/19/21 1143  GLUCAP 126* 153* 182* 201* 140*    Studies/Results: No results found. Medications:  sodium chloride     feeding supplement (OSMOLITE 1.5 CAL) 1,000 mL (06/19/21 0857)   sodium phosphate  Dextrose 5% IVPB 20 mmol (06/19/21 1237)    apixaban  2.5 mg Per Tube BID   atorvastatin  40 mg Per Tube Daily   collagenase   Topical Daily   darbepoetin (ARANESP) injection - DIALYSIS  150 mcg Intravenous Q Mon-HD   feeding supplement (PROSource TF)  45 mL Per Tube Daily   gabapentin  100 mg Per Tube QHS   insulin aspart  0-15 Units Subcutaneous Q4H   midodrine  10 mg Per Tube TID WC   mirtazapine  7.5 mg Per Tube QHS   multivitamin  1 tablet Per Tube QHS   pantoprazole sodium  40 mg Per Tube Daily   sodium chloride flush  3 mL Intravenous Q12H   thiamine  100 mg Per Tube Daily   venlafaxine  37.5 mg Per Tube BID

## 2021-06-19 NOTE — Progress Notes (Signed)
Patient off unit to HD in recliner.

## 2021-06-19 NOTE — Progress Notes (Addendum)
PROGRESS NOTE  Amy [name]  DOB: Dec 18, 1945  PCP: [name], MD [id number]  DOA: 05/19/2021  LOS: 80 days  Hospital Day: 32   Chief Complaint  Patient presents with   Rectal Bleeding    Brief narrative: Amy [name] is a 75 y.o. 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  Patient presented to the ED on 7/25 with dark stool.  On presentation, hemoglobin was 9.6; GI was consulted.  She underwent EGD which was normal and flexible sigmoidoscopy which showed nonbleeding distal rectal ulcers/stercoral ulcers.  Subsequently, GI signed off. Neurology was also consulted for altered mental status; MRI brain was similar to prior MRI at [facility] with a large right MCA stroke.   Nephrology has been following for dialysis needs; nephrology is stating that patient will not be accepted back to outpatient dialysis at this time due to her underlying confusion, weakness and inability to sit in a dialysis recliner as an outpatient.   Due to her poor oral intake, family wanted a PEG tube placement.  IR tried but was unable to place because of overlying transverse colon. General surgery was consulted for G-tube placement but family currently undecided.  She has an NG tube in place.   Palliative care following for goals of care discussion.   Subjective: Patient was seen and examined this morning.  Pleasant elderly African-American female.  Alert, awake, was able to reply 'good morning' to me moving her lips.  Very weak when asked to move her right extremities.  She has a NG feeding tube in place.  Assessment/Plan: Acute on chronic GI bleeding from stercoral ulcers -Patient presented  with anemia,  underwent EGD which was normal and flex sigmoidoscopy with clean base, nonbleeding distal rectal ulcers (stercoral ulcers). Gastroenterology now signed off on 05/22/21. -Hemoglobin remains stable between 7 and 8.  No active bleeding at this time. 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 with no drastic changes which is notable for a large right MCA stroke with advanced microvascular ischemic changes of the white matter; MRA head degraded but did not show occlusion and bilateral carotid ultrasound revealed R ICA and LICA with [id number] stenosis.  EEG with cortical dysfunction right hemisphere likely secondary to underlying structural abnormality/stroke and moderate encephalopathy with no seizure activity or epileptiform discharges.  Hemoglobin A1c 5.1, LDL 68.  No further recommendations per neurology and signed off 05/28/2021. Continue atorvastatin 40 mg p.o. daily -Diet as per SLP recommendations    ESRD on HD -Per Nephro Patient is not a candidate for continued outpatient HD given her confusion, weakness and inability to sit up in a chair for HD outpatient.  -Nephrology suggested transition to hospice care. -As per palliative care discussion with family on 06/05/2021: Family wanted to continue to treat the treatable with hopes for improvement and to continue hemodialysis as long as patient can tolerate. -Patient will need either LTAC placement  or another outpatient hemodialysis unit needs to be found.  Hypophosphatemia -Persistently low.  Careful replacement in dialysis patient.   Recent Labs  Lab 06/14/21 1136 06/15/21 0417 06/15/21 0729  06/16/21 0323 06/17/21 0129 06/18/21 0427 06/19/21 0700  K 4.6   < > 5.5* 5.4* 4.3 4.3 4.7  PHOS 1.1*  --   --  <1.0* 2.4* 1.8* 1.9*   < > = values in this interval not displayed.   Hypotension: Blood pressure currently is stable on midodrine.  Failure to thrive /Generalized deconditioning  /very poor oral intake -PEG tube was desired by family but could not be placed because of overlying transverse colon.   -Currently getting Cortrak feeding. -Ongoing discussion of goals of care.  Type 2 diabetes mellitus Episodes of hypoglycemia -Hemoglobin A1c 5.1.  Diet controlled at baseline. -Episodes of hypoglycemia for which she required dextrose drip briefly.  Currently receiving tube feeding.    Chronic thrombus left upper extremity. Continue Eliquis   Stage II mid coccyx pressure injury, present on admission Continue wound care    GERD Continue PPI   Mobility: Poor mobility Code Status:   Code Status: DNR  Nutritional status: Body mass index is 20.72 kg/m. Nutrition Problem: Severe Malnutrition Etiology: chronic illness (ESRD on HD, prior CVA) Signs/Symptoms: percent weight loss, severe muscle depletion, energy intake < or equal to 75% for > or equal to 1 month Percent weight loss: 16.4 % Diet:  Diet Order             DIET DYS 3 Room service appropriate? Yes; Fluid consistency: Thin  Diet effective now                  DVT prophylaxis:  apixaban (ELIQUIS) tablet 2.5 mg Start: 06/15/21 1115 Place TED hose Start: 05/19/21 1706 apixaban (ELIQUIS) tablet 2.5 mg   Antimicrobials: None at this time Fluid: None Consultants: Nephrology, palliative care Family Communication: Called and updated patient's goddaughter Amy [name] on 8/24.  She is still hopeful that patient will recover.  She is expecting to have an outpatient dialysis facility available where she can get dialysis even when she cannot sit up for it.  She is expecting to find a skilled nursing facility where  patient could go with feeding tube in place.  I explained her that patient has high risk of developing more complications, overall prognosis and very slim chance of getting back to her quality of life.  Amy [name] would like to continue current care at this time.  Status is: Inpatient  Remains inpatient appropriate because: Poor prognosis.  Unable to do outpatient dialysis.  Dispo: The patient is from: Home              Anticipated d/c is to: Unclear at this time              Patient currently is not medically stable to d/c.   Difficult to place patient No     Infusions:   sodium chloride     feeding supplement (OSMOLITE 1.5 CAL) 1,000 mL (06/19/21 0857)   sodium phosphate  Dextrose 5% IVPB      Scheduled Meds:  apixaban  2.5 mg Per Tube BID   atorvastatin  40 mg Per Tube Daily   collagenase   Topical Daily   darbepoetin (ARANESP) injection - DIALYSIS  150 mcg Intravenous Q Mon-HD   feeding supplement (PROSource TF)  45 mL Per Tube Daily   gabapentin  100 mg Per Tube QHS  insulin aspart  0-15 Units Subcutaneous Q4H   midodrine  10 mg Per Tube TID WC   mirtazapine  7.5 mg Per Tube QHS   multivitamin  1 tablet Per Tube QHS   pantoprazole sodium  40 mg Per Tube Daily   sodium chloride flush  3 mL Intravenous Q12H   thiamine  100 mg Per Tube Daily   venlafaxine  37.5 mg Per Tube BID    Antimicrobials: Anti-infectives (From admission, onward)    Start     Dose/Rate Route Frequency Ordered Stop   05/29/21 1622  ceFAZolin (ANCEF) 2-4 GM/100ML-% IVPB       Note to Pharmacy: Lytle Butte   : cabinet override      05/29/21 1622 05/30/21 0429   05/29/21 0000  cefTRIAXone (ROCEPHIN) 1 g in sodium chloride 0.9 % 100 mL IVPB  Status:  Discontinued        1 g 200 mL/hr over 30 Minutes Intravenous Every 24 hours 05/28/21 1801 05/28/21 1801   05/28/21 1830  cefTRIAXone (ROCEPHIN) 1 g in sodium chloride 0.9 % 100 mL IVPB        1 g 200 mL/hr over 30 Minutes Intravenous Every 24  hours 05/28/21 1801 05/30/21 1907   05/28/21 1630  ceFAZolin (ANCEF) IVPB 2g/100 mL premix        2 g 200 mL/hr over 30 Minutes Intravenous To Radiology 05/28/21 1537 05/29/21 1630       PRN meds: sodium chloride, acetaminophen, fentaNYL, HYDROmorphone (DILAUDID) injection, midazolam, ondansetron (ZOFRAN) IV, oxyCODONE, sodium chloride flush   Objective: Vitals:   06/19/21 0400 06/19/21 0957  BP: (!) 102/45 121/64  Pulse: 97 95  Resp: 18 18  Temp: 98.7 F (37.1 C) 98 F (36.7 C)  SpO2: 91% 98%    Intake/Output Summary (Last 24 hours) at 06/19/2021 1127 Last data filed at 06/19/2021 0806 Gross per 24 hour  Intake 1019.17 ml  Output 2000 ml  Net -980.83 ml    Filed Weights   06/16/21 1941 06/18/21 1610 06/18/21 1925  Weight: 62 kg 62 kg 60 kg   Weight change:  Body mass index is 20.72 kg/m.   Physical Exam: General exam: Elderly African-American female.  Not in distress Skin: No rashes, lesions or ulcers. HEENT: Atraumatic, normocephalic, no obvious bleeding.  NG tube feeding in place Lungs: Clear to auscultation bilaterally CVS: Regular rate and rhythm, no murmur GI/Abd soft, nontender, nondistended, bowel sound present CNS: Alert, awake, muffled voice, only few words.  Very weak when asked to move her right extremities. Psychiatry: Depressed look Extremities: No pedal edema, no calf tenderness  Data Review: I have personally reviewed the laboratory data and studies available.  Recent Labs  Lab 06/14/21 1136 06/15/21 0417 06/16/21 0323  WBC 13.7*  --   --   HGB 7.1* 7.1* 7.4*  HCT 22.4* 22.9* 23.1*  MCV 95.3  --   --   PLT 152  --   --     Recent Labs  Lab 06/14/21 1136 06/15/21 0417 06/15/21 0729 06/16/21 0323 06/17/21 0129 06/18/21 0427 06/19/21 0700  NA 131* 132*  --  130* 134* 134* 133*  K 4.6 5.5* 5.5* 5.4* 4.3 4.3 4.7  CL 96* 96*  --  94* 97* 96* 95*  CO2 28 30  --  '29 28 31 31  '$ GLUCOSE 270* 245*  --  66* 127* 119* 189*  BUN 17 23  --   32* 20 31* 28*  CREATININE 4.13* 4.54*  --  4.93* 3.42* 4.05* 3.61*  CALCIUM 7.5* 7.7*  --  7.8* 7.5* 7.8* 7.4*  PHOS 1.1*  --   --  <1.0* 2.4* 1.8* 1.9*     F/u labs ordered Unresulted Labs (From admission, onward)     Start     Ordered   06/16/21 0500  Renal function panel  Daily,   R     Question:  Specimen collection method  Answer:  Lab=Lab collect   06/15/21 1110   Signed and Held  CBC  Once,   R       Question:  Specimen collection method  Answer:  Lab=Lab collect   Signed and Held   Signed and Held  Renal function panel  Once,   R       Question:  Specimen collection method  Answer:  Lab=Lab collect   Signed and Held            Signed, Terrilee Croak, MD [facility] 06/19/2021

## 2021-06-19 NOTE — Evaluation (Signed)
Clinical/Bedside Swallow Evaluation Patient Details  Name: Amy [name] MRN: [id number] Date of Birth: 1946/02/23  Today's Date: 06/19/2021 Time: SLP Start Time (ACUTE ONLY): 1148 SLP Stop Time (ACUTE ONLY): 1203 SLP Time Calculation (min) (ACUTE ONLY): 15 min  Past Medical History:  Past Medical History:  Diagnosis Date   Chronic kidney disease    dialysis M-W-F - dialysis cath located in right side of chest   Diabetes mellitus without complication ([place])    type 2 - no meds    HLD (hyperlipidemia)    diet controlled   Hypertension    no meds   Past Surgical History:  Past Surgical History:  Procedure Laterality Date   A/V FISTULAGRAM Left 01/10/2021   Procedure: A/V FISTULAGRAM;  Surgeon: Angelia Mould, MD;  Location: [place] [facility];  Service: Cardiovascular;  Laterality: Left;   A/V FISTULAGRAM N/A 01/27/2021   Procedure: A/V FISTULAGRAM;  Surgeon: Waynetta Sandy, MD;  Location: [place] [facility];  Service: Cardiovascular;  Laterality: N/A;   AV FISTULA PLACEMENT Left 09/24/2020   Procedure: LEFT UPPER ARM ARTERIOVENOUS GORTEX  GRAFT;  Surgeon: Elam Dutch, MD;  Location: [place];  Service: Vascular;  Laterality: Left;   BREAST CYST EXCISION Right 2011   cyst removed -neg   ESOPHAGOGASTRODUODENOSCOPY (EGD) WITH PROPOFOL N/A 05/21/2021   Procedure: ESOPHAGOGASTRODUODENOSCOPY (EGD) WITH PROPOFOL;  Surgeon: Jackquline Denmark, MD;  [facility] ENDOSCOPY;  Service: Endoscopy;  Laterality: N/A;   EYE SURGERY Bilateral    cataracts   FLEXIBLE SIGMOIDOSCOPY N/A 05/21/2021   Procedure: FLEXIBLE SIGMOIDOSCOPY;  Surgeon: Jackquline Denmark, MD;  Location: [facility] ENDOSCOPY;  Service: Endoscopy;  Laterality: N/A;   INSERTION OF DIALYSIS CATHETER Right 07/2019   IR GASTROSTOMY TUBE MOD SED  05/29/2021   PERIPHERAL VASCULAR BALLOON ANGIOPLASTY  01/10/2021   Procedure: PERIPHERAL VASCULAR BALLOON ANGIOPLASTY;  Surgeon: Angelia Mould, MD;  Location: [place] [facility];   Service: Cardiovascular;;  left AV graft   TUBAL LIGATION     Ultrasound-guided cannulation left upper arm AV graft  01/27/2021   HPI:  Pt is a 75 y.o. female who presented to ER for dark stool with + fecal occult. EGD 7/27: normal without evidence of GI bleed. Palliative care consulted and code status has been changed to DNR. Per EMR, pt's family has noted "physical, fucntional and cognitive decliene over the past several months but dramatic decline in the past 35 days". IR was consulted for PEG placement; unfortunately unable to place on 8/4 due to overlying transverse colon.  General surgery was consulted on 8/5 for surgical G-tube placement, but recommended that it be deferred if pt is unable to tolerate outpatient HD. Palliative care consulted and family meeting conducted 8/8; pt's son will discuss possible hospice with family. 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 Palliative care following.   Assessment / Plan / Recommendation Clinical Impression  Pt was seen for bedside swallow evaluation. She was alert, but cooperation was reduced. Oral mechanism exam was limited due to pt's participation and ability to follow commands. She presented with adequate, natural dentition. Pt was seen with her lunch tray which included the following: garden vegetable soup, mashed sweet potatoes, roasted pork loin, collard greens, dinner roll, orange sorbet, chocolate icecream, iced tea, and milk. With moderate encouragement, pt accepted all boluses except collard greens. Pt demonstrated adequate, thorough mastication of solids, but then stated, "No no no" prior to spitting  each of them out. Pt tolerated individual and up to two consecutive swallows of thin liquids via straw without overt s/sx of aspiration. However, she exhibited coughing following intake of five consecutive swallows of thin liquids via straw. Pt's current diet of dysphagia 3 solids and thin  liquids will be continued with observance of swallowing precautions. Pt appears quite similar to when she was last seen by SLP on 06/04/21, and limited p.o. intake still appears to be due to pt's willingness to eat and not due to any significant dysphagia. Pt's son was contacted via phone and he reported that the pt does not like the hospital's food. Pt's son has verbalized agreement with bringing food from home which he knows the pt enjoys. SLP will see pt once more to attempt to assess deglutition of solids. SLP Visit Diagnosis: Dysphagia, unspecified (R13.10);Dysphagia, oral phase (R13.11)    Aspiration Risk  Mild aspiration risk    Diet Recommendation Dysphagia 3 (Mech soft);Thin liquid   Liquid Administration via: Cup;Straw Medication Administration: Whole meds with puree Supervision: Staff to assist with self feeding Compensations: Slow rate;Small sips/bites;Follow solids with liquid;Minimize environmental distractions    Other  Recommendations Oral Care Recommendations: Oral care BID   Follow up Recommendations 24 hour supervision/assistance      Frequency and Duration min 1 x/week  1 week       Prognosis Prognosis for Safe Diet Advancement: Fair Barriers to Reach Goals: Cognitive deficits;Motivation;Severity of deficits      Swallow Study   General Date of Onset: 05/25/21 HPI: Pt is a 75 y.o. female who presented to ER for dark stool with + fecal occult. EGD 7/27: normal without evidence of GI bleed. Palliative care consulted and code status has been changed to DNR. Per EMR, pt's family has noted "physical, fucntional and cognitive decliene over the past several months but dramatic decline in the past 35 days". IR was consulted for PEG placement; unfortunately unable to place on 8/4 due to overlying transverse colon.  General surgery was consulted on 8/5 for surgical G-tube placement, but recommended that it be deferred if pt is unable to tolerate outpatient HD. Palliative care  consulted and family meeting conducted 8/8; pt's son will discuss possible hospice with family. 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 Palliative care following. Type of Study: Bedside Swallow Evaluation Previous Swallow Assessment: BSE 8/1 Diet Prior to this Study: Dysphagia 3 (soft) Temperature Spikes Noted: No Respiratory Status: Room air History of Recent Intubation: No Behavior/Cognition: Alert Oral Cavity Assessment: Within Functional Limits Oral Care Completed by SLP: No Oral Cavity - Dentition: Adequate natural dentition Vision: Functional for self-feeding Self-Feeding Abilities: Needs assist Patient Positioning: Upright in bed Baseline Vocal Quality: Normal Volitional Cough: Cognitively unable to elicit Volitional Swallow: Unable to elicit    Oral/Motor/Sensory Function     Ice Chips Ice chips: Not tested (pt refused)   Thin Liquid Thin Liquid: Impaired Presentation: Straw Pharyngeal  Phase Impairments: Cough - Immediate    Nectar Thick Nectar Thick Liquid: Not tested   Honey Thick Honey Thick Liquid: Not tested   Puree Puree: Impaired (pt spat bolus out) Presentation: Spoon   Solid     Solid: Impaired Presentation:  (pt spat out after adequate mastication)     Kweli Grassel I. Hardin Negus, [place], [place] Office number [phone] Pager [place] 06/19/2021,2:29 PM

## 2021-06-20 DIAGNOSIS — K922 Gastrointestinal hemorrhage, unspecified: Secondary | ICD-10-CM | POA: Diagnosis not present

## 2021-06-20 LAB — RENAL FUNCTION PANEL
Albumin: 1.9 g/dL — ABNORMAL LOW (ref 3.5–5.0)
Anion gap: 8 (ref 5–15)
BUN: 37 mg/dL — ABNORMAL HIGH (ref 8–23)
CO2: 30 mmol/L (ref 22–32)
Calcium: 7.5 mg/dL — ABNORMAL LOW (ref 8.9–10.3)
Chloride: 94 mmol/L — ABNORMAL LOW (ref 98–111)
Creatinine, Ser: 3.87 mg/dL — ABNORMAL HIGH (ref 0.44–1.00)
GFR, Estimated: 12 mL/min — ABNORMAL LOW (ref >60)
Glucose, Bld: 152 mg/dL — ABNORMAL HIGH (ref 70–99)
Phosphorus: 2.7 mg/dL (ref 2.5–4.6)
Potassium: 5.2 mmol/L — ABNORMAL HIGH (ref 3.5–5.1)
Sodium: 132 mmol/L — ABNORMAL LOW (ref 135–145)

## 2021-06-20 LAB — IRON AND TIBC
Iron: 27 ug/dL — ABNORMAL LOW (ref 28–170)
Saturation Ratios: 27 % (ref 10.4–31.8)
TIBC: 101 ug/dL — ABNORMAL LOW (ref 250–450)
UIBC: 74 ug/dL

## 2021-06-20 LAB — GLUCOSE, CAPILLARY
Glucose-Capillary: 117 mg/dL — ABNORMAL HIGH (ref 70–99)
Glucose-Capillary: 126 mg/dL — ABNORMAL HIGH (ref 70–99)
Glucose-Capillary: 146 mg/dL — ABNORMAL HIGH (ref 70–99)
Glucose-Capillary: 153 mg/dL — ABNORMAL HIGH (ref 70–99)
Glucose-Capillary: 162 mg/dL — ABNORMAL HIGH (ref 70–99)

## 2021-06-20 LAB — FERRITIN: Ferritin: 1070 ng/mL — ABNORMAL HIGH (ref 11–307)

## 2021-06-20 MED ORDER — SODIUM ZIRCONIUM CYCLOSILICATE 10 G PO PACK
10.0000 g | PACK | Freq: Once | ORAL | Status: AC
  Administered 2021-06-20: 10 g via ORAL
  Filled 2021-06-20: qty 1

## 2021-06-20 NOTE — Progress Notes (Signed)
PROGRESS NOTE  Amy [name]  DOB: 1945/12/21  PCP: [name], MD [id number]  DOA: 05/19/2021  LOS: 50 days  Hospital Day: 33   Chief Complaint  Patient presents with   Rectal Bleeding    Brief narrative: Amy [name] is a 75 y.o. 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  Patient presented to the ED on 7/25 with dark stool.  On presentation, hemoglobin was 9.6; GI was consulted.  She underwent EGD which was normal and flexible sigmoidoscopy which showed nonbleeding distal rectal ulcers/stercoral ulcers.  Subsequently, GI signed off. Neurology was also consulted for altered mental status; MRI brain was similar to prior MRI at [facility] with a large right MCA stroke.   Nephrology has been following for dialysis needs; nephrology is stating that patient will not be accepted back to outpatient dialysis at this time due to her underlying confusion, weakness and inability to sit in a dialysis recliner as an outpatient.   Due to her poor oral intake, family wanted a PEG tube placement.  IR tried but was unable to place because of overlying transverse colon. General surgery was consulted for G-tube placement but family currently undecided.  She has an NG tube in place.   Palliative care following for goals of care discussion.   Subjective: Patient was seen and examined this morning.  She is more awake this morning.  Was able to tell me her date of birth.  Did not know that she was in the hospital.    Assessment/Plan: Acute on chronic GI bleeding from stercoral ulcers -Patient presented with anemia,  underwent EGD which was normal and flex sigmoidoscopy with clean base,  nonbleeding distal rectal ulcers (stercoral ulcers). Gastroenterology now signed off on 05/22/21. -Hemoglobin remains stable between 7 and 8.  No active bleeding at this time. 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  EEG with cortical dysfunction right hemisphere likely secondary to underlying structural abnormality/stroke and moderate encephalopathy with no seizure activity or epileptiform discharges.  Hemoglobin A1c 5.1, LDL 68.  No further recommendations per neurology and signed off 05/28/2021. Continue atorvastatin 40 mg p.o. daily -Diet as per SLP recommendations.  Currently on dysphagia 3 diet.    ESRD on HD -Per Nephro Patient is not a candidate for continued outpatient HD given her confusion, weakness and inability to sit up in a chair for HD outpatient.  -Nephrology suggested transition to hospice care. -As per palliative care discussion with family on 06/05/2021: Family wanted to continue to treat the treatable with hopes for improvement and to continue hemodialysis as long as patient can tolerate. -Patient will need either LTAC placement or another outpatient  hemodialysis unit needs to be found.  Hyperkalemia/hypophosphatemia -Potassium level mostly elevated and phosphorus level mostly low.  Management per nephrology Recent Labs  Lab 06/16/21 0323 06/17/21 0129 06/18/21 0427 06/19/21 0700  06/20/21 0541  K 5.4* 4.3 4.3 4.7 5.2*  PHOS <1.0* 2.4* 1.8* 1.9* 2.7    Hypotension: Blood pressure currently is stable on midodrine.  Failure to thrive /Generalized deconditioning  /very poor oral intake -PEG tube was desired by family but could not be placed because of overlying transverse colon.   -Currently getting Cortrak feeding. -Ongoing discussion of goals of care.  Type 2 diabetes mellitus Episodes of hypoglycemia -Hemoglobin A1c 5.1.  Diet controlled at baseline. -Episodes of hypoglycemia for which she required dextrose drip briefly.  Currently receiving tube feeding.    Chronic thrombus left upper extremity. Continue Eliquis   Stage II mid coccyx pressure injury, present on admission Continue wound care    GERD Continue PPI   Mobility: Poor mobility Code Status:   Code Status: DNR  Nutritional status: Body mass index is 20.72 kg/m. Nutrition Problem: Severe Malnutrition Etiology: chronic illness (ESRD on HD, prior CVA) Signs/Symptoms: percent weight loss, severe muscle depletion, energy intake < or equal to 75% for > or equal to 1 month Percent weight loss: 16.4 % Diet:  Diet Order             DIET DYS 3 Room service appropriate? Yes; Fluid consistency: Thin  Diet effective now                  DVT prophylaxis:  apixaban (ELIQUIS) tablet 2.5 mg Start: 06/15/21 1115 Place TED hose Start: 05/19/21 1706 apixaban (ELIQUIS) tablet 2.5 mg   Antimicrobials: None at this time Fluid: None Consultants: Nephrology, palliative care Family Communication: Called and updated patient's goddaughter Ms. Joelene Millin on 8/24.  She is still hopeful that patient will recover.  She is expecting to have an outpatient dialysis facility available where she can get dialysis even when she cannot sit up for it.  She is expecting to find a skilled nursing facility where patient could go with feeding tube in place.  I explained her that patient has high risk of developing more  complications, overall prognosis and very slim chance of getting back to her quality of life.  Ms. Joelene Millin would like to continue current care at this time.  Status is: Inpatient  Remains inpatient appropriate because: Poor prognosis.  Unable to do outpatient dialysis.  Dispo: The patient is from: Home              Anticipated d/c is to: Unclear at this time              Patient currently is not medically stable to d/c.   Difficult to place patient No     Infusions:   sodium chloride     feeding supplement (OSMOLITE 1.5 CAL) 1,000 mL (06/19/21 0857)    Scheduled Meds:  apixaban  2.5 mg Per Tube BID   atorvastatin  40 mg Per Tube Daily   collagenase   Topical Daily   darbepoetin (ARANESP) injection - DIALYSIS  150 mcg Intravenous Q Mon-HD   feeding supplement (PROSource TF)  45 mL Per Tube Daily   gabapentin  100 mg Per Tube QHS   insulin aspart  0-15 Units Subcutaneous Q4H   midodrine  10 mg Per Tube TID WC   mirtazapine  7.5 mg Per Tube QHS   multivitamin  1 tablet Per Tube QHS  pantoprazole sodium  40 mg Per Tube Daily   sodium chloride flush  3 mL Intravenous Q12H   thiamine  100 mg Per Tube Daily   venlafaxine  37.5 mg Per Tube BID    Antimicrobials: Anti-infectives (From admission, onward)    Start     Dose/Rate Route Frequency Ordered Stop   05/29/21 1622  ceFAZolin (ANCEF) 2-4 GM/100ML-% IVPB       Note to Pharmacy: Lytle Butte   : cabinet override      05/29/21 1622 05/30/21 0429   05/29/21 0000  cefTRIAXone (ROCEPHIN) 1 g in sodium chloride 0.9 % 100 mL IVPB  Status:  Discontinued        1 g 200 mL/hr over 30 Minutes Intravenous Every 24 hours 05/28/21 1801 05/28/21 1801   05/28/21 1830  cefTRIAXone (ROCEPHIN) 1 g in sodium chloride 0.9 % 100 mL IVPB        1 g 200 mL/hr over 30 Minutes Intravenous Every 24 hours 05/28/21 1801 05/30/21 1907   05/28/21 1630  ceFAZolin (ANCEF) IVPB 2g/100 mL premix        2 g 200 mL/hr over 30 Minutes Intravenous To  Radiology 05/28/21 1537 05/29/21 1630       PRN meds: sodium chloride, acetaminophen, fentaNYL, HYDROmorphone (DILAUDID) injection, midazolam, ondansetron (ZOFRAN) IV, oxyCODONE, sodium chloride flush   Objective: Vitals:   06/19/21 2058 06/20/21 0615  BP: (!) 129/58 (!) 143/53  Pulse: (!) 103 (!) 107  Resp: 14 18  Temp: 98.1 F (36.7 C) 98.5 F (36.9 C)  SpO2: 100% 100%    Intake/Output Summary (Last 24 hours) at 06/20/2021 1513 Last data filed at 06/20/2021 1330 Gross per 24 hour  Intake 1786.75 ml  Output --  Net 1786.75 ml    Filed Weights   06/16/21 1941 06/18/21 1610 06/18/21 1925  Weight: 62 kg 62 kg 60 kg   Weight change:  Body mass index is 20.72 kg/m.   Physical Exam: General exam: Elderly African-American female.  Not in distress Skin: No rashes, lesions or ulcers. HEENT: Atraumatic, normocephalic, no obvious bleeding.  NG tube feeding in place Lungs: Clear to auscultation bilaterally CVS: Regular rate and rhythm, no murmur GI/Abd soft, nontender, nondistended, bowel sound present CNS: Alert, more awake now.  Has baseline deficits including left hemiparesis and dysphagia because of stroke. Psychiatry: Depressed look Extremities: No pedal edema, no calf tenderness  Data Review: I have personally reviewed the laboratory data and studies available.  Recent Labs  Lab 06/14/21 1136 06/15/21 0417 06/16/21 0323  WBC 13.7*  --   --   HGB 7.1* 7.1* 7.4*  HCT 22.4* 22.9* 23.1*  MCV 95.3  --   --   PLT 152  --   --     Recent Labs  Lab 06/16/21 0323 06/17/21 0129 06/18/21 0427 06/19/21 0700 06/20/21 0541  NA 130* 134* 134* 133* 132*  K 5.4* 4.3 4.3 4.7 5.2*  CL 94* 97* 96* 95* 94*  CO2 '29 28 31 31 30  '$ GLUCOSE 66* 127* 119* 189* 152*  BUN 32* 20 31* 28* 37*  CREATININE 4.93* 3.42* 4.05* 3.61* 3.87*  CALCIUM 7.8* 7.5* 7.8* 7.4* 7.5*  PHOS <1.0* 2.4* 1.8* 1.9* 2.7     F/u labs ordered Unresulted Labs (From admission, onward)     Start      Ordered   Signed and Held  CBC  Once,   R       Question:  Specimen collection method  Answer:  Lab=Lab collect   Signed and Held   Signed and Held  Renal function panel  Once,   R       Question:  Specimen collection method  Answer:  Lab=Lab collect   Signed and Held            Signed, Terrilee Croak, MD [facility] 06/20/2021

## 2021-06-20 NOTE — Progress Notes (Signed)
  Speech Language Pathology Treatment: Dysphagia  Patient Details Name: Amy [name] MRN: [id number] DOB: 20-Jun-1946 Today's Date: 06/20/2021 Time: 1219-1232 SLP Time Calculation (min) (ACUTE ONLY): 13 min  Assessment / Plan / Recommendation Clinical Impression  Pt was seen during lunch for dysphagia treatment. She was alert and communicative during the session. Pt's RN reported that the pt's family has not brought any alternate food to the hospital as yet. Pt's presentation appears similar to that noted during the initial evaluation, but with more frequent refusal of food stating, "uh uh uh" when food was presented, and frequently pursing her lips tightly when food was presented. Trials were therefore limited and still did not include solids. Pt tolerated individuals boluses of thin liquids via straw and a single small bolus of sorbet without overt s/sx of aspiration. Oral clearance was adequate. Pt's primary barrier to p.o. intake continues to be motivation. Pt's current diet will  be continued and further skilled SLP services are not clinically indicated at this time.   HPI HPI: Pt is a 75 y.o. female who presented to ER for dark stool with + fecal occult. EGD 7/27: normal without evidence of GI bleed. Palliative care consulted and code status has been changed to DNR. Per EMR, pt's family has noted "physical, fucntional and cognitive decliene over the past several months but dramatic decline in the past 35 days". IR was consulted for PEG placement; unfortunately unable to place on 8/4 due to overlying transverse colon.  General surgery was consulted on 8/5 for surgical G-tube placement, but recommended that it be deferred if pt is unable to tolerate outpatient HD. Palliative care consulted and family meeting conducted 8/8; pt's son will discuss possible hospice with family. 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 Palliative care  following.      SLP Plan  All goals met;Discharge SLP treatment due to (comment)       Recommendations  Diet recommendations: Dysphagia 3 (mechanical soft);Thin liquid Liquids provided via: Cup;Straw Medication Administration: Whole meds with puree Supervision: Staff to assist with self feeding;Full supervision/cueing for compensatory strategies Compensations: Slow rate;Small sips/bites;Follow solids with liquid;Minimize environmental distractions Postural Changes and/or Swallow Maneuvers: Seated upright 90 degrees;Upright 30-60 min after meal                Oral Care Recommendations: Oral care BID Follow up Recommendations: 24 hour supervision/assistance SLP Visit Diagnosis: Dysphagia, unspecified (R13.10);Dysphagia, oral phase (R13.11) Plan: All goals met;Discharge SLP treatment due to (comment)       Amy [name] I. Hardin Negus, [place], [place] Office number [phone] Pager [place] 06/20/2021, 12:41 PM

## 2021-06-20 NOTE — Progress Notes (Signed)
Palliative Medicine RN Note: Discussed in team rounds. Family remains adamant that they want to continue HD as long as it is offered; please see [place] note on 8/19.   Our team will sign off at this time. If the family specifically requests our help or calls our office, we will re-engage.  Marjie Skiff Shanan Fitzpatrick, RN, BSN, [facility] Palliative Medicine Team 06/20/2021 11:43 AM Office [phone]

## 2021-06-20 NOTE — TOC Progression Note (Addendum)
Transition of Care [facility]) - Progression Note    Patient Details  Name: Amy [name] MRN: [id number] Date of Birth: 1946/09/29  Transition of Care [facility] CM/SW Contact  Sharlet Salina Mila Homer, LCSW Phone Number: 06/20/2021, 5:22 PM  Clinical Narrative:  CSW reviewing notes and continuing to follow patient's progress. Patient came to hospital from [facility]. CSW intervention and discharge planning services will be provided as needed through discharge.    Expected Discharge Plan: [place] Barriers to Discharge: Continued Medical Work up  Expected Discharge Plan and Services Expected Discharge Plan: [place] In-house Referral: Clinical Social Work     Living arrangements for the past 2 months: [place] (Patient came to hospital from [facility])                                       Social Determinants of Health (SDOH) Interventions  None needed or requested at this time.  Readmission Risk Interventions No flowsheet data found.

## 2021-06-20 NOTE — Progress Notes (Signed)
[facility] Progress Note   Subjective:   Patient seen and examined at bedside.  Opens eyes and stares off to left.  Not responding to questions or commands.  Nurse reports she was more alert and responsive earlier today.   Objective Vitals:   06/19/21 0957 06/19/21 1755 06/19/21 2058 06/20/21 0615  BP: 121/64 (!) 134/53 (!) 129/58 (!) 143/53  Pulse: 95 99 (!) 103 (!) 107  Resp: 18 17 14 18   Temp: 98 F (36.7 C) 98.2 F (36.8 C) 98.1 F (36.7 C) 98.5 F (36.9 C)  TempSrc:   Oral Oral  SpO2: 98% 99% 100% 100%  Weight:      Height:       Physical Exam General:chronically ill appearing female in NAD, lethargic, opens eyes to verbal stimuli but otherwise unresponsive  Heart:RRR, no mrg Lungs:CTAB, nml WOB Abdomen:soft, NTND Extremities:trace LE edema from knees to feet, 1+ edema in hips/thighs and B/l UE Dialysis Access: LU AVF +b/t   Filed Weights   06/16/21 1941 06/18/21 1610 06/18/21 1925  Weight: 62 kg 62 kg 60 kg    Intake/Output Summary (Last 24 hours) at 06/20/2021 1525 Last data filed at 06/20/2021 1330 Gross per 24 hour  Intake 1786.75 ml  Output --  Net 1786.75 ml    Additional Objective Labs: Basic Metabolic Panel: Recent Labs  Lab 06/18/21 0427 06/19/21 0700 06/20/21 0541  NA 134* 133* 132*  K 4.3 4.7 5.2*  CL 96* 95* 94*  CO2 31 31 30   GLUCOSE 119* 189* 152*  BUN 31* 28* 37*  CREATININE 4.05* 3.61* 3.87*  CALCIUM 7.8* 7.4* 7.5*  PHOS 1.8* 1.9* 2.7   Liver Function Tests: Recent Labs  Lab 06/18/21 0427 06/19/21 0700 06/20/21 0541  ALBUMIN 2.1* 1.8* 1.9*  CBC: Recent Labs  Lab 06/14/21 1136 06/15/21 0417 06/16/21 0323  WBC 13.7*  --   --   HGB 7.1* 7.1* 7.4*  HCT 22.4* 22.9* 23.1*  MCV 95.3  --   --   PLT 152  --   --    Recent Labs  Lab 06/19/21 1143 06/19/21 1626 06/20/21 0005 06/20/21 0408 06/20/21 1154  GLUCAP 140* 124* 162* 146* 153*    Medications:  sodium chloride     feeding supplement (OSMOLITE  1.5 CAL) 1,000 mL (06/19/21 0857)    apixaban  2.5 mg Per Tube BID   atorvastatin  40 mg Per Tube Daily   collagenase   Topical Daily   darbepoetin (ARANESP) injection - DIALYSIS  150 mcg Intravenous Q Mon-HD   feeding supplement (PROSource TF)  45 mL Per Tube Daily   gabapentin  100 mg Per Tube QHS   insulin aspart  0-15 Units Subcutaneous Q4H   midodrine  10 mg Per Tube TID WC   mirtazapine  7.5 mg Per Tube QHS   multivitamin  1 tablet Per Tube QHS   pantoprazole sodium  40 mg Per Tube Daily   sodium chloride flush  3 mL Intravenous Q12H   thiamine  100 mg Per Tube Daily   venlafaxine  37.5 mg Per Tube BID    Dialysis Orders: AF MWF  3h 79mn 400/500    62.5kg    3K/2.5 Ca    P2  AVG HeRO   Hep none - Mircera 50 q 2 wks  (7/13)  Venofer 50 q wk - Parsabiv 569mIV q HDlower extremity edema, left upper arm swelling, nontender dialysis Access: Left AVG    Problem/Plan: AMS/FTT: Providers and  palliative care met w/ family and discussed pt's decline. Due to declining mental and physical status we don't anticipate she will be able to do OP dialysis.  Pt will have to be able to sit in a chair for 4-6 hrs to qualify for OP HD w/ our group. If she is able to sit for dialysis and cooperate, then the next step would to re-CLIP the patient as her OP HD unit will not take her back.  Trial of recliner dialysis needed with plan to try today.  2.   Melena/Rectal bleeding - GI consulted. Hx rectal ulcers.  EGD 7/27 normal, flex sig with mucosal ulceration c/w sterocoral ulcers, non bleeding.  Last hgb 7.4. ESRD: Schedule varying due to staff shortage in hosp.  Last HD 8/24,  plan for HD today. Issues with cannulation on 8/20 with plan for [facility] placement, no issues since using expert cannulater and [facility] placement cancelled.  Hyperkalemia- K 5.2 this AM.   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  Increased volume noted on exam, getting under dry weight, will need to lower on d/c.  UF as tolerated.  Nutrition: Albumin 2.1 > 1.8 continue protein supps.  TF per RD once Cortrak in place.  Also had food tray with pured food today Recent CVA with no evidence of meaningful recovery.  Anemia  - Hgb 7.4 Aranesp 150 mcg given 06/14/21 /check iron studies Dispo: DNR/DNI. Poor prognosis. Palliative care has seen see #1 Appreciate their involvement. We do not belive dialysis is adding to her quality of life. Per family communication  family wishes to continue dialysis for now. At this point does not appear suitable for outpatient dialysis  Jen Mow, PA-C [place] 06/20/2021,3:25 PM  LOS: 31 days

## 2021-06-21 DIAGNOSIS — K922 Gastrointestinal hemorrhage, unspecified: Secondary | ICD-10-CM | POA: Diagnosis not present

## 2021-06-21 LAB — GLUCOSE, CAPILLARY
Glucose-Capillary: 121 mg/dL — ABNORMAL HIGH (ref 70–99)
Glucose-Capillary: 144 mg/dL — ABNORMAL HIGH (ref 70–99)
Glucose-Capillary: 162 mg/dL — ABNORMAL HIGH (ref 70–99)
Glucose-Capillary: 72 mg/dL (ref 70–99)
Glucose-Capillary: 95 mg/dL (ref 70–99)

## 2021-06-21 NOTE — Progress Notes (Signed)
[facility] Progress Note   Subjective:   Patient seen and examined at bedside.  More alert today but remains confused, answering some questions with yes or no.  Denies CP, SOB, abdominal pain. Reports "my backside hurts."  Plan for dialysis in chair today.  Objective Vitals:   06/20/21 1617 06/20/21 2028 06/21/21 0443 06/21/21 0952  BP: (!) 148/63 (!) 140/46 (!) 127/52 (!) 148/58  Pulse: 98 96 97 99  Resp: _0 Temp: 98.6 F (37 C) 98.8 F (37.1 C) 98.2 F (36.8 C) 98.2 F (36.8 C)  TempSrc: Oral  Oral Oral  SpO2: 97% 99% 99%   Weight:      Height:       Physical Exam General:chronically ill appearing female in NAD Heart:RRR, no mrg Lungs:CTAB, nml WOB on RA Abdomen:soft, NTND Extremities:1+ edema in hips, 2+ edema in upper extremities Dialysis Access:  LU AVF +b/t  Filed Weights   06/16/21 1941 06/18/21 1610 06/18/21 1925  Weight: 62 kg 62 kg 60 kg    Intake/Output Summary (Last 24 hours) at 06/21/2021 1213 Last data filed at 06/21/2021 0900 Gross per 24 hour  Intake 1025 ml  Output 0 ml  Net 1025 ml    Additional Objective Labs: Basic Metabolic Panel: Recent Labs  Lab 06/18/21 0427 06/19/21 0700 06/20/21 0541  NA 134* 133* 132*  K 4.3 4.7 5.2*  CL 96* 95* 94*  CO2 _1 GLUCOSE 119* 189* 152*  BUN 31* 28* 37*  CREATININE 4.05* 3.61* 3.87*  CALCIUM 7.8* 7.4* 7.5*  PHOS 1.8* 1.9* 2.7   Liver Function Tests: Recent Labs  Lab 06/18/21 0427 06/19/21 0700 06/20/21 0541  ALBUMIN 2.1* 1.8* 1.9*    CBC: Recent Labs  Lab 06/15/21 0417 06/16/21 0323  HGB 7.1* 7.4*  HCT 22.9* 23.1*   CBG: Recent Labs  Lab 06/20/21 1624 06/20/21 2011 06/21/21 0027 06/21/21 0435 06/21/21 0806  GLUCAP 117* 126* 72 95 121*   Iron Studies:  Recent Labs    06/20/21 1634  IRON 27*  TIBC 101*  FERRITIN 1,070*   Lab Results  Component Value Date   INR 0.9 03/10/2021   INR 1.0 01/26/2021   Studies/Results: No results  found.  Medications:  sodium chloride     feeding supplement (OSMOLITE 1.5 CAL) 1,000 mL (06/21/21 7867)    apixaban  2.5 mg Per Tube BID   atorvastatin  40 mg Per Tube Daily   collagenase   Topical Daily   darbepoetin (ARANESP) injection - DIALYSIS  150 mcg Intravenous Q Mon-HD   feeding supplement (PROSource TF)  45 mL Per Tube Daily   gabapentin  100 mg Per Tube QHS   insulin aspart  0-15 Units Subcutaneous Q4H   midodrine  10 mg Per Tube TID WC   mirtazapine  7.5 mg Per Tube QHS   multivitamin  1 tablet Per Tube QHS   pantoprazole sodium  40 mg Per Tube Daily   sodium chloride flush  3 mL Intravenous Q12H   thiamine  100 mg Per Tube Daily   venlafaxine  37.5 mg Per Tube BID    Dialysis Orders: AF MWF  3h 65mn 400/500    62.5kg    3K/2.5 Ca    P2  AVG HeRO   Hep none - Mircera 50 q 2 wks  (7/13)  Venofer 50 q wk - Parsabiv 556mIV q HDlower extremity edema, left upper arm swelling, nontender dialysis Access: Left AVG  Problem/Plan: AMS/FTT: Providers and palliative care met w/ family and discussed pt's decline. Due to declining mental and physical status we don't anticipate she will be able to do OP dialysis.  Pt will have to be able to sit in a chair for 4-6 hrs to qualify for OP HD w/ our group. If she is able to sit for dialysis and cooperate, then the next step would to re-CLIP the patient as her OP HD unit will not take her back.  Trial of recliner dialysis needed with plan to try today.  2.   Melena/Rectal bleeding - GI consulted. Hx rectal ulcers.  EGD 7/27 normal, flex sig with mucosal ulceration c/w sterocoral ulcers, non bleeding.  Last hgb 7.4. ESRD: Schedule varying due to staff shortage in hosp.  Last HD 8/24,  plan for HD today. Issues with cannulation on 8/20 with plan for [facility] placement, no issues since using expert cannulater so [facility] placement cancelled.  Hyperkalemia- last K 5.2, lokelma 43m given yesterday.  LUE edema: Had f'gram 6/23 which showed chronic  thrombus and was started on Eliquis - currently on hold due to #1. 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  Increased volume noted on exam, getting under dry weight, will need to lower on d/c.  UF as tolerated.  Nutrition: Albumin 2.1 > 1.8 continue protein supps.  TF per RD once Cortrak in place.  Also had food tray with pured food today Recent CVA with no evidence of meaningful recovery.  Anemia  - Hgb 7.4 Aranesp 150 mcg given 06/14/21 /check iron studies Dispo: DNR/DNI. Poor prognosis. Palliative care has seen see #1 Appreciate their involvement. We do not belive dialysis is adding to her quality of life. Per family communication  family wishes to continue dialysis for now. At this point does not appear suitable for outpatient dialysis  LJen Mow PA-C [place]8/27/2022,12:13 PM  LOS: 32 days

## 2021-06-21 NOTE — Progress Notes (Signed)
PROGRESS NOTE  Amy [name]  DOB: 12/26/1945  PCP: [name], MD [id number]  DOA: 05/19/2021  LOS: 34 days  Hospital Day: 34   Chief Complaint  Patient presents with   Rectal Bleeding    Brief narrative: Amy [name] is a 75 y.o. 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  Patient presented to the ED on 7/25 with dark stool.  On presentation, hemoglobin was 9.6; GI was consulted.  She underwent EGD which was normal and flexible sigmoidoscopy which showed nonbleeding distal rectal ulcers/stercoral ulcers.  Subsequently, GI signed off. Neurology was also consulted for altered mental status; MRI brain was similar to prior MRI at [facility] with a large right MCA stroke.   Nephrology has been following for dialysis needs; nephrology is stating that patient will not be accepted back to outpatient dialysis at this time due to her underlying confusion, weakness and inability to sit in a dialysis recliner as an outpatient.   Due to her poor oral intake, family wanted a PEG tube placement.  IR tried but was unable to place because of overlying transverse colon. General surgery was consulted for G-tube placement but family currently undecided.  She has an NG tube in place.   Palliative care following for goals of care discussion.   Subjective: Patient was seen and examined this morning.  Awake alert, mumbling.  Able to tell her date of birth.  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  Gastroenterology now signed off on 05/22/21. -Hemoglobin remains stable between 7 and 8.  No active bleeding at this time. 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 with no drastic changes which is notable for a large right MCA stroke with advanced microvascular ischemic changes of the white matter; MRA head degraded but did not show occlusion and bilateral carotid ultrasound revealed R ICA and LICA with [id number] stenosis.  EEG with cortical dysfunction right hemisphere likely secondary to underlying structural abnormality/stroke and moderate encephalopathy with no seizure activity or epileptiform discharges.  Hemoglobin A1c 5.1, LDL 68.  No further recommendations per neurology and signed off 05/28/2021. Continue atorvastatin 40 mg p.o. daily -Diet as per SLP recommendations.  Currently on dysphagia 3 diet.    ESRD on HD -Per Nephro Patient is not a candidate for continued outpatient HD given her confusion, weakness and inability to sit up in a chair for HD outpatient.  -Nephrology suggested transition to hospice care. -As per palliative care discussion with family on 06/05/2021: Family wanted to continue to treat the treatable with hopes for improvement and to continue hemodialysis as long as patient can tolerate. -Patient will need either LTAC placement or another outpatient hemodialysis unit needs to be found.  Hyperkalemia/hypophosphatemia -Potassium level mostly elevated and phosphorus level mostly low.  Management per nephrology Recent Labs  Lab 06/16/21 0323 06/17/21 0129 06/18/21 0427 06/19/21 0700 06/20/21 0541  K 5.4* 4.3 4.3 4.7 5.2*  PHOS <1.0*  2.4* 1.8* 1.9* 2.7    Hypotension: Blood pressure currently is stable on midodrine.  Failure to thrive /Generalized deconditioning  /very poor oral intake -PEG tube was desired by family but could not be placed because of overlying transverse colon.   -Currently getting Cortrak feeding.  Patient's family insist on continuing Coretrak feeding.  However, patient cannot be placed at any SNF with a core track in place.  We will discuss with general surgery on Monday again to see if they would consider G-tube placement.  Type 2 diabetes mellitus Episodes of hypoglycemia -Hemoglobin A1c 5.1.  Diet controlled at baseline. -Episodes of hypoglycemia for which she required dextrose drip briefly.  Currently receiving tube feeding.    Chronic thrombus left upper extremity. Continue Eliquis   Stage II mid coccyx pressure injury, present on admission Continue wound care    GERD Continue PPI   Mobility: Poor mobility Code Status:   Code Status: DNR  Nutritional status: Body mass index is 20.72 kg/m. Nutrition Problem: Severe Malnutrition Etiology: chronic illness (ESRD on HD, prior CVA) Signs/Symptoms: percent weight loss, severe muscle depletion, energy intake < or equal to 75% for > or equal to 1 month Percent weight loss: 16.4 % Diet:  Diet Order             DIET DYS 3 Room service appropriate? Yes; Fluid consistency: Thin  Diet effective now                  DVT prophylaxis:  apixaban (ELIQUIS) tablet 2.5 mg Start: 06/15/21 1115 Place TED hose Start: 05/19/21 1706 apixaban (ELIQUIS) tablet 2.5 mg   Antimicrobials: None at this time Fluid: None Consultants: Nephrology, palliative care Family Communication: Called and updated patient's goddaughter Ms. Joelene Millin on 8/24.  She is still hopeful that patient will recover.  She is expecting to have an outpatient dialysis facility available where she can get dialysis even when she cannot sit up for it.  She is expecting to find a  skilled nursing facility where patient could go with feeding tube in place.  I explained her that patient has high risk of developing more complications, overall prognosis and very slim chance of getting back to her quality of life.  Ms. Joelene Millin would like to continue current care at this time.  Status is: Inpatient  Remains inpatient appropriate because: Poor prognosis.  Unable to do outpatient dialysis.  Dispo: The patient is from: Home              Anticipated d/c is to: Unclear at this time              Patient currently is not medically stable to d/c.   Difficult to place patient No     Infusions:   sodium chloride     feeding supplement (OSMOLITE 1.5 CAL) 1,000 mL (06/21/21 [id number])    Scheduled Meds:  apixaban  2.5 mg Per Tube BID   atorvastatin  40 mg Per Tube Daily   collagenase   Topical Daily   darbepoetin (ARANESP) injection - DIALYSIS  150 mcg Intravenous Q Mon-HD   feeding supplement (PROSource TF)  45 mL Per Tube Daily   gabapentin  100 mg Per Tube QHS   insulin aspart  0-15 Units Subcutaneous Q4H   midodrine  10 mg Per Tube TID WC  mirtazapine  7.5 mg Per Tube QHS   multivitamin  1 tablet Per Tube QHS   pantoprazole sodium  40 mg Per Tube Daily   sodium chloride flush  3 mL Intravenous Q12H   thiamine  100 mg Per Tube Daily   venlafaxine  37.5 mg Per Tube BID    Antimicrobials: Anti-infectives (From admission, onward)    Start     Dose/Rate Route Frequency Ordered Stop   05/29/21 1622  ceFAZolin (ANCEF) 2-4 GM/100ML-% IVPB       Note to Pharmacy: Lytle Butte   : cabinet override      05/29/21 1622 05/30/21 0429   05/29/21 0000  cefTRIAXone (ROCEPHIN) 1 g in sodium chloride 0.9 % 100 mL IVPB  Status:  Discontinued        1 g 200 mL/hr over 30 Minutes Intravenous Every 24 hours 05/28/21 1801 05/28/21 1801   05/28/21 1830  cefTRIAXone (ROCEPHIN) 1 g in sodium chloride 0.9 % 100 mL IVPB        1 g 200 mL/hr over 30 Minutes Intravenous Every 24 hours  05/28/21 1801 05/30/21 1907   05/28/21 1630  ceFAZolin (ANCEF) IVPB 2g/100 mL premix        2 g 200 mL/hr over 30 Minutes Intravenous To Radiology 05/28/21 1537 05/29/21 1630       PRN meds: sodium chloride, acetaminophen, fentaNYL, HYDROmorphone (DILAUDID) injection, midazolam, ondansetron (ZOFRAN) IV, oxyCODONE, sodium chloride flush   Objective: Vitals:   06/21/21 0443 06/21/21 0952  BP: (!) 127/52 (!) 148/58  Pulse: 97 99  Resp: 19 20  Temp: 98.2 F (36.8 C) 98.2 F (36.8 C)  SpO2: 99%     Intake/Output Summary (Last 24 hours) at 06/21/2021 1156 Last data filed at 06/21/2021 0900 Gross per 24 hour  Intake 1025 ml  Output 0 ml  Net 1025 ml    Filed Weights   06/16/21 1941 06/18/21 1610 06/18/21 1925  Weight: 62 kg 62 kg 60 kg   Weight change:  Body mass index is 20.72 kg/m.   Physical Exam: General exam: Elderly African-American female.  Not in distress Skin: No rashes, lesions or ulcers. HEENT: Atraumatic, normocephalic, no obvious bleeding.  NG tube feeding in place Lungs: Clear to auscultation bilaterally CVS: Regular rate and rhythm, no murmur GI/Abd soft, nontender, nondistended, bowel sound present CNS: Alert, more awake now.  Has baseline deficits including left hemiparesis and dysphagia because of stroke. Psychiatry: Depressed look Extremities: No pedal edema, no calf tenderness  Data Review: I have personally reviewed the laboratory data and studies available.  Recent Labs  Lab 06/15/21 0417 06/16/21 0323  HGB 7.1* 7.4*  HCT 22.9* 23.1*    Recent Labs  Lab 06/16/21 0323 06/17/21 0129 06/18/21 0427 06/19/21 0700 06/20/21 0541  NA 130* 134* 134* 133* 132*  K 5.4* 4.3 4.3 4.7 5.2*  CL 94* 97* 96* 95* 94*  CO2 '29 28 31 31 30  '$ GLUCOSE 66* 127* 119* 189* 152*  BUN 32* 20 31* 28* 37*  CREATININE 4.93* 3.42* 4.05* 3.61* 3.87*  CALCIUM 7.8* 7.5* 7.8* 7.4* 7.5*  PHOS <1.0* 2.4* 1.8* 1.9* 2.7     F/u labs ordered [employer] (From  admission, onward)     Start     Ordered   Signed and Held  CBC  Once,   R       Question:  Specimen collection method  Answer:  Lab=Lab collect   Signed and Held   Signed and Held  Renal function panel  Once,   R       Question:  Specimen collection method  Answer:  Lab=Lab collect   Signed and Held            Signed, Terrilee Croak, MD [facility] 06/21/2021

## 2021-06-21 NOTE — Plan of Care (Signed)
  Problem: Health Behavior/Discharge Planning: Goal: Ability to manage health-related needs will improve Outcome: Not Progressing   Problem: Clinical Measurements: Goal: Ability to maintain clinical measurements within normal limits will improve Outcome: Not Progressing   Problem: Activity: Goal: Risk for activity intolerance will decrease Outcome: Not Progressing   Problem: Nutrition: Goal: Adequate nutrition will be maintained Outcome: Not Progressing   Problem: Coping: Goal: Level of anxiety will decrease Outcome: Not Progressing   Problem: Safety: Goal: Ability to remain free from injury will improve Outcome: Not Progressing   Problem: Skin Integrity: Goal: Risk for impaired skin integrity will decrease Outcome: Not Progressing

## 2021-06-21 NOTE — Progress Notes (Signed)
Patient went to HD, tolerated/completed treatment on HD chair.

## 2021-06-21 NOTE — Plan of Care (Signed)
°  Problem: Nutrition: °Goal: Adequate nutrition will be maintained °Outcome: Progressing °  °Problem: Coping: °Goal: Level of anxiety will decrease °Outcome: Progressing °  °Problem: Safety: °Goal: Ability to remain free from injury will improve °Outcome: Progressing °  °

## 2021-06-22 ENCOUNTER — Inpatient Hospital Stay (HOSPITAL_COMMUNITY): Payer: Medicare Other

## 2021-06-22 DIAGNOSIS — Z515 Encounter for palliative care: Secondary | ICD-10-CM | POA: Diagnosis not present

## 2021-06-22 DIAGNOSIS — R609 Edema, unspecified: Secondary | ICD-10-CM

## 2021-06-22 DIAGNOSIS — A419 Sepsis, unspecified organism: Secondary | ICD-10-CM | POA: Diagnosis not present

## 2021-06-22 DIAGNOSIS — K633 Ulcer of intestine: Secondary | ICD-10-CM | POA: Diagnosis not present

## 2021-06-22 DIAGNOSIS — Z66 Do not resuscitate: Secondary | ICD-10-CM | POA: Diagnosis not present

## 2021-06-22 DIAGNOSIS — K922 Gastrointestinal hemorrhage, unspecified: Secondary | ICD-10-CM | POA: Diagnosis not present

## 2021-06-22 DIAGNOSIS — E875 Hyperkalemia: Secondary | ICD-10-CM

## 2021-06-22 LAB — CBC
HCT: 21.7 % — ABNORMAL LOW (ref 36.0–46.0)
Hemoglobin: 6.8 g/dL — CL (ref 12.0–15.0)
MCH: 30 pg (ref 26.0–34.0)
MCHC: 31.3 g/dL (ref 30.0–36.0)
MCV: 95.6 fL (ref 80.0–100.0)
Platelets: 245 10*3/uL (ref 150–400)
RBC: 2.27 MIL/uL — ABNORMAL LOW (ref 3.87–5.11)
RDW: 19.2 % — ABNORMAL HIGH (ref 11.5–15.5)
WBC: 11.8 10*3/uL — ABNORMAL HIGH (ref 4.0–10.5)
nRBC: 0.2 % (ref 0.0–0.2)

## 2021-06-22 LAB — RENAL FUNCTION PANEL
Albumin: 1.8 g/dL — ABNORMAL LOW (ref 3.5–5.0)
Anion gap: 8 (ref 5–15)
BUN: 41 mg/dL — ABNORMAL HIGH (ref 8–23)
CO2: 28 mmol/L (ref 22–32)
Calcium: 7.8 mg/dL — ABNORMAL LOW (ref 8.9–10.3)
Chloride: 97 mmol/L — ABNORMAL LOW (ref 98–111)
Creatinine, Ser: 3.82 mg/dL — ABNORMAL HIGH (ref 0.44–1.00)
GFR, Estimated: 12 mL/min — ABNORMAL LOW (ref >60)
Glucose, Bld: 94 mg/dL (ref 70–99)
Phosphorus: 2.6 mg/dL (ref 2.5–4.6)
Potassium: 6.6 mmol/L (ref 3.5–5.1)
Sodium: 133 mmol/L — ABNORMAL LOW (ref 135–145)

## 2021-06-22 LAB — GLUCOSE, CAPILLARY
Glucose-Capillary: 130 mg/dL — ABNORMAL HIGH (ref 70–99)
Glucose-Capillary: 174 mg/dL — ABNORMAL HIGH (ref 70–99)
Glucose-Capillary: 162 mg/dL — ABNORMAL HIGH (ref 70–99)
Glucose-Capillary: 142 mg/dL — ABNORMAL HIGH (ref 70–99)
Glucose-Capillary: 114 mg/dL — ABNORMAL HIGH (ref 70–99)
Glucose-Capillary: 151 mg/dL — ABNORMAL HIGH (ref 70–99)

## 2021-06-22 LAB — PREPARE RBC (CROSSMATCH)

## 2021-06-22 MED ORDER — INSULIN ASPART 100 UNIT/ML IV SOLN
5.0000 [IU] | Freq: Once | INTRAVENOUS | Status: AC
  Administered 2021-06-22: 5 [IU] via INTRAVENOUS

## 2021-06-22 MED ORDER — SODIUM CHLORIDE 0.9 % IV SOLN
100.0000 mg | INTRAVENOUS | Status: DC
  Administered 2021-06-23: 100 mg via INTRAVENOUS
  Filled 2021-06-22 (×3): qty 5

## 2021-06-22 MED ORDER — DEXTROSE 50 % IV SOLN
1.0000 | Freq: Once | INTRAVENOUS | Status: AC
  Administered 2021-06-22: 50 mL via INTRAVENOUS
  Filled 2021-06-22: qty 50

## 2021-06-22 MED ORDER — SODIUM ZIRCONIUM CYCLOSILICATE 10 G PO PACK
10.0000 g | PACK | Freq: Once | ORAL | Status: AC
  Administered 2021-06-22: 10 g
  Filled 2021-06-22: qty 1

## 2021-06-22 MED ORDER — CALCIUM GLUCONATE-NACL 1-0.675 GM/50ML-% IV SOLN
1.0000 g | Freq: Once | INTRAVENOUS | Status: AC
  Administered 2021-06-22: 1000 mg via INTRAVENOUS
  Filled 2021-06-22: qty 50

## 2021-06-22 MED ORDER — SODIUM CHLORIDE 0.9% IV SOLUTION: Freq: Once | INTRAVENOUS | Status: AC

## 2021-06-22 MED ORDER — SODIUM BICARBONATE 8.4 % IV SOLN
50.0000 meq | Freq: Once | INTRAVENOUS | Status: AC
  Administered 2021-06-22: 50 meq via INTRAVENOUS
  Filled 2021-06-22: qty 50

## 2021-06-22 NOTE — Progress Notes (Addendum)
[facility] Progress Note   Subjective:   Patient seen and examined at bedside.  Opens eyes to verbal stimuli and answering yes to all questions.  Tolerated dialysis in chair yesterday.   Objective Vitals:   06/21/21 1648 06/21/21 2013 06/22/21 0629 06/22/21 0937  BP: (!) 142/61 (!) 85/75 (!) 135/59 (!) 131/54  Pulse: (!) 102 100 99 (!) 102  Resp: 18 20 20 18   Temp: 98.6 F (37 C) 98.6 F (37 C) 99 F (37.2 C) 98.8 F (37.1 C)  TempSrc: Axillary     SpO2: 98% 99% 97%   Weight:      Height:       Physical Exam General:chronically ill appearing female in NAD Heart:RRR, no mrg Lungs:CTAB anterolaterally  Abdomen:soft, NTND Extremities:1-2+ edema in hips/thighs and upper extremities Dialysis Access: LU AVF +b/t  Filed Weights   06/16/21 1941 06/18/21 1610 06/18/21 1925  Weight: 62 kg 62 kg 60 kg    Intake/Output Summary (Last 24 hours) at 06/22/2021 1103 Last data filed at 06/22/2021 1000 Gross per 24 hour  Intake 100 ml  Output 0 ml  Net 100 ml    Additional Objective Labs: Basic Metabolic Panel: Recent Labs  Lab 06/18/21 0427 06/19/21 0700 06/20/21 0541  NA 134* 133* 132*  K 4.3 4.7 5.2*  CL 96* 95* 94*  CO2 31 31 30   GLUCOSE 119* 189* 152*  BUN 31* 28* 37*  CREATININE 4.05* 3.61* 3.87*  CALCIUM 7.8* 7.4* 7.5*  PHOS 1.8* 1.9* 2.7   Liver Function Tests: Recent Labs  Lab 06/18/21 0427 06/19/21 0700 06/20/21 0541  ALBUMIN 2.1* 1.8* 1.9*   CBC: Recent Labs  Lab 06/16/21 0323  HGB 7.4*  HCT 23.1*   Blood Culture    Component Value Date/Time   SDES URINE, CATHETERIZED 05/02/2021 0022   SPECREQUEST NONE 05/02/2021 0022   CULT  05/02/2021 0022    NO GROWTH Performed at [facility], [address] [place], [place] [address]    REPTSTATUS 05/04/2021 FINAL 05/02/2021 0022   CBG: Recent Labs  Lab 06/21/21 1621 06/21/21 2218 06/22/21 0111 06/22/21 0618 06/22/21 0717  GLUCAP 144* 162* 130* 174* 162*   Iron  Studies:  Recent Labs    06/20/21 1634  IRON 27*  TIBC 101*  FERRITIN 1,070*   Lab Results  Component Value Date   INR 0.9 03/10/2021   INR 1.0 01/26/2021   Studies/Results: No results found.  Medications:  sodium chloride     feeding supplement (OSMOLITE 1.5 CAL) 1,000 mL (06/21/21 5364)    apixaban  2.5 mg Per Tube BID   atorvastatin  40 mg Per Tube Daily   collagenase   Topical Daily   darbepoetin (ARANESP) injection - DIALYSIS  150 mcg Intravenous Q Mon-HD   feeding supplement (PROSource TF)  45 mL Per Tube Daily   gabapentin  100 mg Per Tube QHS   insulin aspart  0-15 Units Subcutaneous Q4H   midodrine  10 mg Per Tube TID WC   mirtazapine  7.5 mg Per Tube QHS   multivitamin  1 tablet Per Tube QHS   pantoprazole sodium  40 mg Per Tube Daily   sodium chloride flush  3 mL Intravenous Q12H   thiamine  100 mg Per Tube Daily   venlafaxine  37.5 mg Per Tube BID    Dialysis Orders: AF MWF  3h 54mn 400/500    62.5kg    3K/2.5 Ca    P2  AVG HeRO  Hep none - Mircera 50 q 2 wks  (7/13)  Venofer 50 q wk - Parsabiv 64m IV q HDlower extremity edema, left upper arm swelling, nontender dialysis Access: Left AVG    Problem/Plan: AMS/FTT: Providers and palliative care met w/ family and discussed pt's decline. Due to declining mental and physical status we don't anticipate she will be able to do OP dialysis.  Pt will have to be able to sit in a chair for 4-6 hrs to qualify for OP HD w/ our group. If she is able to sit for dialysis and cooperate, then the next step would to re-CLIP the patient as her OP HD unit will not take her back.  Tolerated dialysis x1 in chair yesterday.  Will plan for HD in chair again tomorrow.   2.   Melena/Rectal bleeding - GI consulted. Hx rectal ulcers.  EGD 7/27 normal, flex sig with mucosal ulceration c/w sterocoral ulcers, non bleeding.  Last hgb 7.4.  Order to recheck today.  ESRD: Schedule varying due to staff shortage in hosp.  Last HD 8/27.  Plan  for HD again tomorrow in chair. Issues with [facility] since [facility].  Hyperkalemia- resolved.  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  Increased volume noted on exam, getting under dry weight, will need to lower on d/c.  Net UF 2L yesterday.   Nutrition: Albumin 2.1 > 1.8 continue protein supps.  TF per RD once Cortrak in place.  Also had food tray with pured food today Recent CVA with no evidence of meaningful recovery.  Anemia  - last Hgb 7.4 Aranesp 150 mcg given 06/14/21. Tsat 27%, will start iron load.  Recheck labs.  Dispo: DNR/DNI. Poor prognosis. Palliative care has seen see #1 Appreciate their involvement. We do not belive dialysis is adding to her quality of life. Per family communication  family wishes to continue dialysis for now. At this point does not appear suitable for outpatient dialysis   LJen Mow PA-C CBear Dance8/28/2022,11:03 AM  LOS: 33 days   Pt seen, examined and agree w A/P as above.  RKelly Splinter MD 06/22/2021, 6:26 PM

## 2021-06-22 NOTE — Progress Notes (Signed)
VASCULAR LAB    Left upper extremity venous duplex has been performed.  See CV proc for preliminary results.   Daquana Paddock, RVT 06/22/2021, 6:31 PM

## 2021-06-22 NOTE — Progress Notes (Signed)
PROGRESS NOTE  Amy [name]  DOB: 11-28-1945  PCP: [name], MD [id number]  DOA: 05/19/2021  LOS: 37 days  Hospital Day: 19   Chief Complaint  Patient presents with   Rectal Bleeding    Brief narrative: Amy [name] is a 75 y.o. 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  Patient presented to the ED on 7/25 with dark stool.  On presentation, hemoglobin was 9.6; GI was consulted.  She underwent EGD which was normal and flexible sigmoidoscopy which showed nonbleeding distal rectal ulcers/stercoral ulcers.  Subsequently, GI signed off. Neurology was also consulted for altered mental status; MRI brain was similar to prior MRI at [facility] with a large right MCA stroke.   Nephrology has been following for dialysis needs; nephrology is stating that patient will not be accepted back to outpatient dialysis at this time due to her underlying confusion, weakness and inability to sit in a dialysis recliner as an outpatient.   Due to her poor oral intake, family wanted a PEG tube placement.  IR tried but was unable to place because of overlying transverse colon. General surgery was consulted for G-tube placement but family currently undecided.  She has an NG tube in place.   Palliative care following for goals of care discussion.   Subjective: Patient was seen and examined this morning.  Alert, awake, mumbling.  Less conversational today.  Grimaces on touching her left upper extremity which is swollen.  Assessment/Plan: Acute on chronic GI bleeding from stercoral ulcers -Patient presented with anemia,  underwent EGD which was normal and flex sigmoidoscopy with clean base,  nonbleeding distal rectal ulcers (stercoral ulcers). Gastroenterology now signed off on 05/22/21. -Hemoglobin remains stable between 7 and 8.  No active bleeding at this time. 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  EEG with cortical dysfunction right hemisphere likely secondary to underlying structural abnormality/stroke and moderate encephalopathy with no seizure activity or epileptiform discharges.  Hemoglobin A1c 5.1, LDL 68.  No further recommendations per neurology and signed off 05/28/2021. Continue atorvastatin 40 mg p.o. daily -Diet as per SLP recommendations.  Currently on dysphagia 3 diet and core track feeding.    ESRD on HD -Per Nephro Patient is not a candidate for continued outpatient HD given her confusion, weakness and inability to sit up in a chair for HD outpatient.  -Nephrology suggested transition to hospice care. -As per palliative care discussion with family on 06/05/2021: Family wanted to continue to treat the treatable with hopes for improvement and to continue hemodialysis as long as patient can tolerate. -Patient will need either LTAC placement or another outpatient hemodialysis unit needs to be found.  Hyperkalemia/hypophosphatemia -Management per nephrology.  Hypotension -Blood pressure currently is stable on midodrine.  Failure to thrive /Generalized deconditioning  /very  poor oral intake -PEG tube was desired by family but could not be placed because of overlying transverse colon.   -Currently getting Cortrak feeding.  Patient's family insist on continuing Coretrak feeding.  However, patient cannot be placed at any SNF with a core track in place.  We will discuss with general surgery on Monday again to see if they would consider G-tube placement.  Type 2 diabetes mellitus Episodes of hypoglycemia -Hemoglobin A1c 5.1.  Diet controlled at baseline. -Patient had episodes of hypoglycemia for which she required dextrose drip briefly.  Currently receiving tube feeding.    Chronic thrombus left upper extremity -Continue Eliquis -Left upper extremity seems more swollen today.  Obtain ultrasound duplex.   Stage II mid coccyx pressure injury, present on admission -Continue wound care    GERD -Continue PPI   Mobility: Poor mobility Code Status:   Code Status: DNR  Nutritional status: Body mass index is 20.72 kg/m. Nutrition Problem: Severe Malnutrition Etiology: chronic illness (ESRD on HD, prior CVA) Signs/Symptoms: percent weight loss, severe muscle depletion, energy intake < or equal to 75% for > or equal to 1 month Percent weight loss: 16.4 % Diet:  Diet Order             DIET DYS 3 Room service appropriate? Yes; Fluid consistency: Thin  Diet effective now                  DVT prophylaxis:  apixaban (ELIQUIS) tablet 2.5 mg Start: 06/15/21 1115 Place TED hose Start: 05/19/21 1706 apixaban (ELIQUIS) tablet 2.5 mg   Antimicrobials: None at this time Fluid: None Consultants: Nephrology, palliative care Family Communication: Called and updated patient's goddaughter Ms. Joelene Millin on 8/24.  She is still hopeful that patient will recover.  She is expecting to have an outpatient dialysis facility available where she can get dialysis even when she cannot sit up for it.  She is expecting to find a skilled nursing facility where patient could go with  feeding tube in place.  I explained her that patient has high risk of developing more complications, overall prognosis and very slim chance of getting back to her quality of life.  Ms. Joelene Millin would like to continue current care at this time.  Status is: Inpatient  Remains inpatient appropriate because: Poor prognosis.  Unable to do outpatient dialysis.  Dispo: The patient is from: Home              Anticipated d/c is to: Unclear at this time              Patient currently is not medically stable to d/c.   Difficult to place patient No     Infusions:   sodium chloride     feeding supplement (OSMOLITE 1.5 CAL) 1,000 mL (06/21/21 [id number])   [START ON 06/23/2021] iron sucrose      Scheduled Meds:  apixaban  2.5 mg Per Tube BID   atorvastatin  40 mg Per Tube Daily   collagenase   Topical Daily   darbepoetin (ARANESP) injection - DIALYSIS  150 mcg Intravenous Q Mon-HD   feeding supplement (PROSource TF)  45 mL Per Tube Daily   gabapentin  100 mg Per Tube QHS   insulin aspart  0-15 Units Subcutaneous Q4H   midodrine  10 mg Per Tube TID WC   mirtazapine  7.5 mg Per Tube  QHS   multivitamin  1 tablet Per Tube QHS   pantoprazole sodium  40 mg Per Tube Daily   sodium chloride flush  3 mL Intravenous Q12H   thiamine  100 mg Per Tube Daily   venlafaxine  37.5 mg Per Tube BID    Antimicrobials: Anti-infectives (From admission, onward)    Start     Dose/Rate Route Frequency Ordered Stop   05/29/21 1622  ceFAZolin (ANCEF) 2-4 GM/100ML-% IVPB       Note to Pharmacy: Lytle Butte   : cabinet override      05/29/21 1622 05/30/21 0429   05/29/21 0000  cefTRIAXone (ROCEPHIN) 1 g in sodium chloride 0.9 % 100 mL IVPB  Status:  Discontinued        1 g 200 mL/hr over 30 Minutes Intravenous Every 24 hours 05/28/21 1801 05/28/21 1801   05/28/21 1830  cefTRIAXone (ROCEPHIN) 1 g in sodium chloride 0.9 % 100 mL IVPB        1 g 200 mL/hr over 30 Minutes Intravenous Every 24 hours 05/28/21 1801  05/30/21 1907   05/28/21 1630  ceFAZolin (ANCEF) IVPB 2g/100 mL premix        2 g 200 mL/hr over 30 Minutes Intravenous To Radiology 05/28/21 1537 05/29/21 1630       PRN meds: sodium chloride, acetaminophen, fentaNYL, HYDROmorphone (DILAUDID) injection, midazolam, ondansetron (ZOFRAN) IV, oxyCODONE, sodium chloride flush   Objective: Vitals:   06/22/21 0629 06/22/21 0937  BP: (!) 135/59 (!) 131/54  Pulse: 99 (!) 102  Resp: 20 18  Temp: 99 F (37.2 C) 98.8 F (37.1 C)  SpO2: 97%     Intake/Output Summary (Last 24 hours) at 06/22/2021 1151 Last data filed at 06/22/2021 1000 Gross per 24 hour  Intake 100 ml  Output 0 ml  Net 100 ml    Filed Weights   06/16/21 1941 06/18/21 1610 06/18/21 1925  Weight: 62 kg 62 kg 60 kg   Weight change:  Body mass index is 20.72 kg/m.   Physical Exam: General exam: Elderly African-American female.  Not in distress at rest.  Grimaces on pressing left upper extremity. Skin: No rashes, lesions or ulcers. HEENT: Atraumatic, normocephalic, no obvious bleeding.  NG tube feeding in place Lungs: Clear to auscultation bilaterally CVS: Regular rate and rhythm, no murmur GI/Abd soft, nontender, nondistended, bowel sound present CNS: Alert, more awake now.  Has baseline deficits including left hemiparesis and dysphagia because of stroke. Psychiatry: Depressed look Extremities: No pedal edema, no calf tenderness  Data Review: I have personally reviewed the laboratory data and studies available.  Recent Labs  Lab 06/16/21 0323  HGB 7.4*  HCT 23.1*    Recent Labs  Lab 06/16/21 0323 06/17/21 0129 06/18/21 0427 06/19/21 0700 06/20/21 0541  NA 130* 134* 134* 133* 132*  K 5.4* 4.3 4.3 4.7 5.2*  CL 94* 97* 96* 95* 94*  CO2 '29 28 31 31 30  '$ GLUCOSE 66* 127* 119* 189* 152*  BUN 32* 20 31* 28* 37*  CREATININE 4.93* 3.42* 4.05* 3.61* 3.87*  CALCIUM 7.8* 7.5* 7.8* 7.4* 7.5*  PHOS <1.0* 2.4* 1.8* 1.9* 2.7     F/u labs ordered Unresulted  Labs (From admission, onward)     Start     Ordered   06/22/21 1118  Renal function panel  Once,   R       Question:  Specimen collection method  Answer:  Lab=Lab collect   06/22/21 1117   06/22/21 1118  CBC  Once,   R       Question:  Specimen collection method  Answer:  Lab=Lab collect   06/22/21 1117   Signed and Held  CBC  Once,   R       Question:  Specimen collection method  Answer:  Lab=Lab collect   Signed and Held   Signed and Held  Renal function panel  Once,   R       Question:  Specimen collection method  Answer:  Lab=Lab collect   Signed and Held            Signed, Terrilee Croak, MD [facility] 06/22/2021

## 2021-06-22 NOTE — Progress Notes (Signed)
   06/22/21 1900  Provider Notification  Provider Name/Title Dr. Pietro Cassis  Date Provider Notified 06/22/21  Time Provider Notified 1900  Notification Type Page  Notification Reason Critical result  Test performed and critical result K+ 6.6  Date Critical Result Received 06/22/21  Time Critical Result Received 1900  Also Amion Paged the nightshift MD at this time. Awaiting response

## 2021-06-22 NOTE — Progress Notes (Signed)
   06/22/21 1832  Provider Notification  Provider Name/Title Dr. Pietro Cassis  Date Provider Notified 06/22/21  Time Provider Notified [id number]  Notification Type Page  Notification Reason Critical result  Test performed and critical result HGB 6.8  Date Critical Result Received 06/22/21  Time Critical Result Received 1832

## 2021-06-22 NOTE — Progress Notes (Signed)
Cross-coverage note:   Notified of Hgb 6.8 and potassium 6.6 in this ESRD patient with recent lower GI bleeding who is planned for HD tomorrow.   Plan to check EKG, administer Lokelma per tube, insulin with dextrose, calcium, bicarbonate, and 1 unit RBC and then repeat potassium level around midnight and 5 am. Discussed plan with RN.

## 2021-06-23 ENCOUNTER — Inpatient Hospital Stay (HOSPITAL_COMMUNITY): Payer: Medicare Other

## 2021-06-23 DIAGNOSIS — R1312 Dysphagia, oropharyngeal phase: Secondary | ICD-10-CM | POA: Insufficient documentation

## 2021-06-23 DIAGNOSIS — R627 Adult failure to thrive: Secondary | ICD-10-CM | POA: Diagnosis present

## 2021-06-23 DIAGNOSIS — D638 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: Secondary | ICD-10-CM | POA: Diagnosis not present

## 2021-06-23 DIAGNOSIS — N186 End stage renal disease: Secondary | ICD-10-CM | POA: Diagnosis not present

## 2021-06-23 DIAGNOSIS — R638 Other symptoms and signs concerning food and fluid intake: Secondary | ICD-10-CM | POA: Insufficient documentation

## 2021-06-23 DIAGNOSIS — K922 Gastrointestinal hemorrhage, unspecified: Secondary | ICD-10-CM | POA: Diagnosis not present

## 2021-06-23 DIAGNOSIS — E43 Unspecified severe protein-calorie malnutrition: Secondary | ICD-10-CM | POA: Diagnosis not present

## 2021-06-23 LAB — POTASSIUM: Potassium: 3.1 mmol/L — ABNORMAL LOW (ref 3.5–5.1)

## 2021-06-23 LAB — BASIC METABOLIC PANEL
Anion gap: 6 (ref 5–15)
BUN: 47 mg/dL — ABNORMAL HIGH (ref 8–23)
CO2: 32 mmol/L (ref 22–32)
Calcium: 7.9 mg/dL — ABNORMAL LOW (ref 8.9–10.3)
Chloride: 93 mmol/L — ABNORMAL LOW (ref 98–111)
Creatinine, Ser: 4.08 mg/dL — ABNORMAL HIGH (ref 0.44–1.00)
GFR, Estimated: 11 mL/min — ABNORMAL LOW (ref >60)
Glucose, Bld: 99 mg/dL (ref 70–99)
Potassium: 6.7 mmol/L (ref 3.5–5.1)
Sodium: 131 mmol/L — ABNORMAL LOW (ref 135–145)

## 2021-06-23 LAB — POCT I-STAT, CHEM 8
BUN: 20 mg/dL (ref 8–23)
Calcium, Ion: 1.1 mmol/L — ABNORMAL LOW (ref 1.15–1.40)
Chloride: 93 mmol/L — ABNORMAL LOW (ref 98–111)
Creatinine, Ser: 1.9 mg/dL — ABNORMAL HIGH (ref 0.44–1.00)
Glucose, Bld: 113 mg/dL — ABNORMAL HIGH (ref 70–99)
HCT: 30 % — ABNORMAL LOW (ref 36.0–46.0)
Hemoglobin: 10.2 g/dL — ABNORMAL LOW (ref 12.0–15.0)
Potassium: 3.5 mmol/L (ref 3.5–5.1)
Sodium: 133 mmol/L — ABNORMAL LOW (ref 135–145)
TCO2: 32 mmol/L (ref 22–32)

## 2021-06-23 LAB — GLUCOSE, CAPILLARY
Glucose-Capillary: 104 mg/dL — ABNORMAL HIGH (ref 70–99)
Glucose-Capillary: 107 mg/dL — ABNORMAL HIGH (ref 70–99)
Glucose-Capillary: 58 mg/dL — ABNORMAL LOW (ref 70–99)
Glucose-Capillary: 66 mg/dL — ABNORMAL LOW (ref 70–99)
Glucose-Capillary: 86 mg/dL (ref 70–99)
Glucose-Capillary: 93 mg/dL (ref 70–99)
Glucose-Capillary: 97 mg/dL (ref 70–99)

## 2021-06-23 LAB — BPAM RBC
Blood Product Expiration Date: 202209282359
ISSUE DATE / TIME: 202208282305
Unit Type and Rh: 5100

## 2021-06-23 LAB — TYPE AND SCREEN
ABO/RH(D): O POS
Antibody Screen: NEGATIVE
Unit division: 0

## 2021-06-23 LAB — HEMOGLOBIN AND HEMATOCRIT, BLOOD
HCT: 26 % — ABNORMAL LOW (ref 36.0–46.0)
Hemoglobin: 8.4 g/dL — ABNORMAL LOW (ref 12.0–15.0)

## 2021-06-23 MED ORDER — GLUCAGON HCL RDNA (DIAGNOSTIC) 1 MG IJ SOLR
INTRAMUSCULAR | Status: AC
  Administered 2021-06-23: 1 mg via INTRAMUSCULAR
  Filled 2021-06-23: qty 1

## 2021-06-23 MED ORDER — GLUCAGON HCL RDNA (DIAGNOSTIC) 1 MG IJ SOLR: 1.0000 mg | INTRAMUSCULAR | Status: AC

## 2021-06-23 MED ORDER — INSULIN ASPART 100 UNIT/ML IJ SOLN
0.0000 [IU] | INTRAMUSCULAR | Status: DC
  Administered 2021-06-26 – 2021-07-02 (×8): 1 [IU] via SUBCUTANEOUS
  Administered 2021-07-03: 2 [IU] via SUBCUTANEOUS
  Administered 2021-07-03 – 2021-07-04 (×4): 1 [IU] via SUBCUTANEOUS
  Administered 2021-07-05 (×2): 2 [IU] via SUBCUTANEOUS
  Administered 2021-07-05 – 2021-07-09 (×8): 1 [IU] via SUBCUTANEOUS
  Administered 2021-07-09: 2 [IU] via SUBCUTANEOUS
  Administered 2021-07-09 – 2021-07-13 (×12): 1 [IU] via SUBCUTANEOUS

## 2021-06-23 MED ORDER — LACTULOSE 10 GM/15ML PO SOLN
20.0000 g | Freq: Every day | ORAL | Status: DC
  Administered 2021-06-26 – 2021-06-29 (×5): 20 g via ORAL
  Filled 2021-06-23 (×8): qty 30

## 2021-06-23 MED ORDER — DEXTROSE 50 % IV SOLN: 12.5000 g | INTRAVENOUS | Status: AC

## 2021-06-23 MED ORDER — DEXTROSE 50 % IV SOLN
INTRAVENOUS | Status: AC
  Filled 2021-06-23: qty 50

## 2021-06-23 MED ORDER — OXYCODONE HCL 5 MG PO TABS
5.0000 mg | ORAL_TABLET | Freq: Two times a day (BID) | ORAL | Status: DC | PRN
  Administered 2021-06-23 – 2021-06-26 (×3): 5 mg
  Filled 2021-06-23 (×4): qty 1

## 2021-06-23 NOTE — Progress Notes (Addendum)
Nutrition Follow-up  DOCUMENTATION CODES:  Severe malnutrition in context of chronic illness  INTERVENTION:  Continue TF via Cortrak: -Osmolite 1.5 @ 34m/hr (12043md)  -4532mrosource TF daily  Provides 1840 kcals, 86g protein, 914m70mee water  NUTRITION DIAGNOSIS:  Severe Malnutrition related to chronic illness (ESRD on HD, prior CVA) as evidenced by percent weight loss, severe muscle depletion, energy intake < or equal to 75% for > or equal to 1 month. -- ongoing  GOAL:  Patient will meet greater than or equal to 90% of their needs -- met with TF  MONITOR:  PO intake, Supplement acceptance, Diet advancement, Labs, Weight trends, Skin, I & O's  REASON FOR ASSESSMENT:  Consult Enteral/tube feeding initiation and management  ASSESSMENT:  75 y27female with PMH of ESRD on HD MWF, HTN, HLD, T2DM, CVA s/p TPA 02/2021, ?blood clot in UE, left hemiparesis from CVA who presented to ER for dark stool with + fecal occult.  8/17 - cortrak placed (gastric tip) 8/19 - cortrak replaced (gastric tip)  Pt in HD at time of RD visit. Attempting to tolerate HD in chair. Per RN, pt continues to refuse POs and is tolerating TF via Cortrak. Pt unable to discharge due to Cortrak/lack of food acceptance, but family wishes to continue TF. General surgery to evaluate pt today to re-assess ability to place G-tube.   Current regimen: Osmolite 1.5 @ 50ml57m(1200ml/76m/ 45ml P11murce TF daily   PO intake: 0% x last 8 recorded meals   Medications: Scheduled Meds:  apixaban  2.5 mg Per Tube BID   atorvastatin  40 mg Per Tube Daily   collagenase   Topical Daily   darbepoetin (ARANESP) injection - DIALYSIS  150 mcg Intravenous Q Mon-HD   feeding supplement (PROSource TF)  45 mL Per Tube Daily   gabapentin  100 mg Per Tube QHS   insulin aspart  0-15 Units Subcutaneous Q4H   midodrine  10 mg Per Tube TID WC   mirtazapine  7.5 mg Per Tube QHS   multivitamin  1 tablet Per Tube QHS   pantoprazole  sodium  40 mg Per Tube Daily   sodium chloride flush  3 mL Intravenous Q12H   thiamine  100 mg Per Tube Daily   venlafaxine  37.5 mg Per Tube BID  Continuous Infusions:  sodium chloride     feeding supplement (OSMOLITE 1.5 CAL) 1,000 mL (06/23/21 0232)   iron sucrose     Labs: Recent Labs  Lab 06/19/21 0700 06/20/21 0541 06/22/21 1742 06/23/21 0723 06/23/21 1036 06/23/21 1048  NA 133* 132* 133* 131*  --  133*  K 4.7 5.2* 6.6* 6.7* 3.1* 3.5  CL 95* 94* 97* 93*  --  93*  CO2 31 30 28  32  --   --   BUN 28* 37* 41* 47*  --  20  CREATININE 3.61* 3.87* 3.82* 4.08*  --  1.90*  CALCIUM 7.4* 7.5* 7.8* 7.9*  --   --   PHOS 1.9* 2.7 2.6  --   --   --   GLUCOSE 189* 152* 94 99  --  113*  CBGs [id number] Coordinator following)  No UOP documented x24 hours Last HD 8/24, 2L net UF I/O: +12.7L since admit  Pt is below EDW EDW 62.5 kg Current weight (last updated 8/24): 60 kg Admission weight: 58.1 kg  RN edema assessment notes the following: -Generalized moderate pitting edema -Moderate pitting edema to RUE -Deep pitting edema to LUE -Non-pitting edema  to BLE  Diet Order:   Diet Order             DIET DYS 3 Room service appropriate? Yes; Fluid consistency: Thin  Diet effective now                  EDUCATION NEEDS:  Education needs have been addressed  Skin:  Skin Assessment: Skin Integrity Issues: Skin Integrity Issues:: Stage II Stage II: Pressure Injury on coccyx; Open wound on buttocks  Last BM:  8/27  Height:  Ht Readings from Last 1 Encounters:  05/20/21 5' 7"  (1.702 m)   Weight:  Wt Readings from Last 1 Encounters:  06/18/21 60 kg   BMI:  Body mass index is 20.72 kg/m.  Estimated Nutritional Needs:  Kcal:  1700-1900 Protein:  85-100 grams Fluid:  1L +UOP    Larkin Ina, MS, RD, LDN (she/her/hers) RD pager number and weekend/on-call pager number located in [place].

## 2021-06-23 NOTE — Progress Notes (Signed)
Patients coretrak is clogged, will not flush or run. Coretrak team is gone for the day. MD notified.  Will continue to monitor.  Aurther Loft, RN

## 2021-06-23 NOTE — Evaluation (Signed)
Physical Therapy Evaluation Patient Details Name: Amy [name] MRN: [id number] DOB: [birth year] Today's Date: 06/23/2021   History of Present Illness  Pt is a 75 y.o. female admitted from short-term rehab at SNF on 05/19/21 with dark stool and fecal occult. S/p EGD and sigmoidoscopy which showed stercoral ulcers. Plan for PEG tube placement 8/4. Pt with decline in the past few months with failure to thrive, acute metabolic encephalopathy. 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 PMH includes ESRD (on HD), HTN, CVA (residual L-side hemiparesis).   Clinical Impression  Pt admitted with above diagnosis. Pt currently with functional limitations due to the deficits listed below (see PT Problem List). PT was reconsulted after original evaluation on 8/4. Pt presents to the hospital from SNF where she required assistance for functional mobility and now continues to require +2 total assist for all mobility and ADL's. She demonstrated L side inattention, decreased cognition, and minimal command following. This patient is appropriate for Maxi Move lift to the chair to improve sitting tolerance for HD. Nursing staff is already using the lift for OOB transfers. At this time, the patient does not have any acute skilled therapy needs and recommend return to SNF for long term care. Will sign off at this time. If needs change, please reconsult.     Follow Up Recommendations SNF;Supervision for mobility/OOB    Equipment Recommendations  Hospital bed (hoyer lift)    Recommendations for Other Services       Precautions / Restrictions Precautions Precautions: Fall;Other (comment) Precaution Comments: bladder/bowel incontinence; L-side hemiplegia, L-side inattention; hallucinating Restrictions Weight Bearing Restrictions: No      Mobility  Bed Mobility Overal bed mobility: Needs Assistance Bed Mobility: Rolling Rolling: Total assist;+2 for physical assistance         General bed mobility  comments: Total A for rolling with peri care. +2 assist to slide pt up in the bed at end of session.    Transfers                 General transfer comment: RN staff having recently transfers back to bed using maximove after sitting in recliner  Ambulation/Gait                Stairs            Wheelchair Mobility    Modified Rankin (Stroke Patients Only)       Balance                                             Pertinent Vitals/Pain Pain Assessment: Faces Faces Pain Scale: Hurts a little bit Pain Location: Grimacing/moaning/guarding with BUE/BLE/cervical ROM and repositioning Pain Descriptors / Indicators: Grimacing;Guarding Pain Intervention(s): Limited activity within patient's tolerance;Monitored during session;Repositioned    Home Living Family/patient expects to be discharged to:: Skilled nursing facility                      Prior Function Level of Independence: Needs assistance   Gait / Transfers Assistance Needed: Suspect dependent for all mobility based on L-side hemiplegia and associated contractures  ADL's / Homemaking Assistance Needed: Suspect dependent for all ADL tasks with assist from staff  Comments: New evaluation to increase activity tolerance in preparation for sititng in HD chair     Hand Dominance        Extremity/Trunk Assessment  Upper Extremity Assessment Upper Extremity Assessment: Defer to OT evaluation LUE Deficits / Details: No active movement noted in LUE, significant tone, flexor/add/IR contracture    Lower Extremity Assessment Lower Extremity Assessment: RLE deficits/detail;LLE deficits/detail RLE Deficits / Details: Active movement noted, but functionally ~2/5 throughout, bilateral knee extension contracture. LLE Deficits / Details: No active movement noted LLE; painful with attempts at PROM but did manage to achieve ~10 of flexion    Cervical / Trunk Assessment Cervical / Trunk  Assessment: Kyphotic  Communication   Communication: Expressive difficulties;Receptive difficulties  Cognition Arousal/Alertness: Awake/alert Behavior During Therapy: Anxious Overall Cognitive Status: No family/caregiver present to determine baseline cognitive functioning                                 General Comments: Pt benefiting from calm, positive cues. Perseverating on statement "ouch" with any movement. Answering certain questions and pariticipating in conversation; poor attention and stating tangiental things like "is betty here now? oh hi betty!"      General Comments      Exercises General Exercises - Upper Extremity Elbow Flexion: AAROM;Right;10 reps;Supine Elbow Extension: AAROM;Right;10 reps;Supine Wrist Flexion: AAROM;Right;10 reps;Supine Wrist Extension: AAROM;Right;10 reps;Supine General Exercises - Lower Extremity Heel Slides: 10 reps;Right;Left;PROM   Assessment/Plan    PT Assessment All further PT needs can be met in the next venue of care  PT Problem List Decreased strength;Decreased range of motion;Decreased activity tolerance;Decreased balance;Decreased mobility;Decreased coordination;Decreased cognition;Decreased knowledge of use of DME;Decreased safety awareness;Decreased knowledge of precautions;Impaired sensation;Impaired tone;Decreased skin integrity;Pain       PT Treatment Interventions      PT Goals (Current goals can be found in the Care Plan section)  Acute Rehab PT Goals Patient Stated Goal: None stated PT Goal Formulation: Patient unable to participate in goal setting Time For Goal Achievement: 06/12/21 Potential to Achieve Goals: Poor    Frequency     Barriers to discharge        Co-evaluation PT/OT/SLP Co-Evaluation/Treatment: Yes Reason for Co-Treatment: Complexity of the patient's impairments (multi-system involvement);Necessary to address cognition/behavior during functional activity;For patient/therapist safety;To  address functional/ADL transfers PT goals addressed during session: Mobility/safety with mobility;Strengthening/ROM OT goals addressed during session: ADL's and self-care       AM-PAC PT "6 Clicks" Mobility  Outcome Measure Help needed turning from your back to your side while in a flat bed without using bedrails?: Total Help needed moving from lying on your back to sitting on the side of a flat bed without using bedrails?: Total Help needed moving to and from a bed to a chair (including a wheelchair)?: Total Help needed standing up from a chair using your arms (e.g., wheelchair or bedside chair)?: Total Help needed to walk in hospital room?: Total Help needed climbing 3-5 steps with a railing? : Total 6 Click Score: 6    End of Session Equipment Utilized During Treatment: Oxygen Activity Tolerance: Patient limited by pain;Other (comment) (limited by cognition/ability to participate) Patient left: in bed;with call bell/phone within reach;with bed alarm set Nurse Communication: Mobility status;Need for lift equipment PT Visit Diagnosis: Other abnormalities of gait and mobility (R26.89);Hemiplegia and hemiparesis;Other symptoms and signs involving the nervous system (R29.898)    Time: 7209-4709 PT Time Calculation (min) (ACUTE ONLY): 25 min   Charges:   PT Evaluation $PT Eval High Complexity: 1 High          Rolinda Roan, PT, DPT Acute Rehabilitation Services Pager:  [phone] Office: [phone]   Thelma Comp 06/23/2021, 5:17 PM

## 2021-06-23 NOTE — Progress Notes (Signed)
Subjective:  on HD in Chair, tells me doesn't like recliner,  Objective Vital signs in last 24 hours: Vitals:   06/23/21 1000 06/23/21 1030 06/23/21 1100 06/23/21 1130  BP: (!) 149/74 (!) 129/113 (!) 70/53 (!) 88/68  Pulse: (!) 109 97 100 (!) 108  Resp:   19 19  Temp:      TempSrc:      SpO2:      Weight:      Height:       Weight change:   Physical Exam: General: Obese alert chronicallu  ill female , AND Heart: RRR, No MRG Lungs: Cta Anter. Rm Air Abdomen: NABS, Obese, NT,ND Extremities: trace pedal edema, and 1 + hips/thighs  and Bilat upper extem with L arm contracture  Dialysis Access: LUA AVF POs . bruit   OP Dialysis Orders: AF MWF  3h 67mn 400/500    62.5kg    3K/2.5 Ca    P2  AVG HeRO   Hep none - Mircera 50 q 2 wks  (7/13)  Venofer 50 q wk - Parsabiv 556mIV q HDlower extremity edema, left upper arm swelling, nontender dialysis Access: Left AVG    Problem/Plan: AMS/FTT: Providers and palliative care met w/ family and discussed pt's decline. Due to declining mental and physical status we don't anticipate she will be able to do OP dialysis.  Pt will have to be able to sit in a chair for 4-6 hrs to qualify for OP HD w/ our group. If she is able to sit for dialysis and cooperate, then the next step would to re-CLIP the patient as her OP HD unit will not take her back.  Tolerated dialysis x1 in chair last week. HD in chair again today    2.   Melena/Rectal bleeding - GI consulted. Hx rectal ulcers.  EGD 7/27 normal, flex sig with mucosal ulceration c/w sterocoral ulcers, non bleeding.  Last hgb 10.4 today  ESRD: Schedule varying due to staff shortage in hosp.  Last HD 8/27.  HD again today in chair. Issues with cannulation on 8/20 [facility], no issues since using [facility]ater [facility].  Hyperkalemia- resolved.  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  Increased volume noted on exam, getting under dry weight, will need to lower on d/c.  Net UF 2L last hd /tolerating today so far   Nutrition: Albumin 2.1 > 1.8 continue protein supps.  TF per RD once Cortrak in place.  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  Anemia  - last Hgb 10.4 today  Aranesp 150 mcg given 06/14/21. Tsat 27%, will start iron load.  Recheck labs.  Dispo: DNR/DNI. Poor prognosis. Palliative care has seen see #1 Appreciate their involvement. We do not belive dialysis is adding to her quality of life. Per family communication  family wishes to continue dialysis for now. At this point does not appear suitable   [facility] Beeper [phone]/29/2022,11:57 AM  LOS: 34 days   Labs: Basic Metabolic Panel: Recent Labs  Lab 06/19/21 0700 06/20/21 0541 06/22/21 1742 06/23/21 0723 06/23/21 1036 06/23/21 1048  NA 133* 132* 133* 131*  --  133*  K 4.7 5.2* 6.6* 6.7* 3.1* 3.5  CL 95* 94* 97* 93*  --  93*  CO2 31 30 28  32  --   --   GLUCOSE 189* 152* 94 99  --  113*  BUN 28* 37* 41* 47*  --  20  CREATININE 3.61* 3.87* 3.82* 4.08*  --  1.90*  CALCIUM 7.4* 7.5* 7.8* 7.9*  --   --   PHOS 1.9* 2.7 2.6  --   --   --    Liver Function Tests: Recent Labs  Lab 06/19/21 0700 06/20/21 0541 06/22/21 1742  ALBUMIN 1.8* 1.9* 1.8*   No results for input(s): LIPASE, AMYLASE in the last 168 hours. No results for input(s): AMMONIA in the last 168 hours. CBC: Recent Labs  Lab 06/22/21 1742 06/23/21 0723 06/23/21 1048  WBC 11.8*  --   --   HGB 6.8* 8.4* 10.2*  HCT 21.7* 26.0* 30.0*  MCV 95.6  --   --   PLT 245  --   --    Cardiac Enzymes: No results for input(s): CKTOTAL, CKMB, CKMBINDEX, TROPONINI in the last 168 hours. CBG: Recent Labs  Lab 06/22/21 2358 06/23/21 0031 06/23/21 0050 06/23/21 0401 06/23/21 0825  GLUCAP 58* 66* 86 97  104*    Studies/Results: VAS [place] UPPER EXTREMITY VENOUS DUPLEX  Result Date: 06/22/2021 UPPER VENOUS STUDY  Patient Name:  Amy [name] Issa  Date of Exam:   06/22/2021 Medical Rec #: [id number]         Accession #:    [id number] Date of Birth: 1946/09/29         Patient Gender: F Patient Age:   75 years Exam Location:  [facility] Procedure:      VAS [place] UPPER EXTREMITY VENOUS DUPLEX Referring Phys: Terrilee Croak --------------------------------------------------------------------------------  Indications: Swelling Limitations: Poor ultrasound/tissue interface, body habitus and Severe swelling. Comparison Study: No prior left UEV on file Performing Technologist: Sharion Dove RVS  Examination Guidelines: A complete evaluation includes B-mode imaging, spectral Doppler, color Doppler, and power Doppler as needed of all accessible portions of each vessel. Bilateral testing is considered an integral part of a complete examination. Limited examinations for reoccurring indications may be performed as noted.  Right Findings: +----------+------------+---------+-----------+----------+-------+ RIGHT     CompressiblePhasicitySpontaneousPropertiesSummary +----------+------------+---------+-----------+----------+-------+ Subclavian               Yes       Yes                      +----------+------------+---------+-----------+----------+-------+  Left Findings: +----------+------------+---------+-----------+----------+---------------------+ LEFT      CompressiblePhasicitySpontaneousProperties       Summary        +----------+------------+---------+-----------+----------+---------------------+ IJV                      Yes       Yes                                    +----------+------------+---------+-----------+----------+---------------------+ Subclavian               Yes       Yes                                     +----------+------------+---------+-----------+----------+---------------------+ Axillary                 Yes       Yes                                    +----------+------------+---------+-----------+----------+---------------------+  Brachial                 Yes       Yes                not all portions                                                             visualized       +----------+------------+---------+-----------+----------+---------------------+ Radial                                              patent by color, not                                                          all portions                                                               visualized       +----------+------------+---------+-----------+----------+---------------------+ Cephalic      Full                                    Not all portions                                                             visualized       +----------+------------+---------+-----------+----------+---------------------+ Basilic                                                Not visualized     +----------+------------+---------+-----------+----------+---------------------+  *See table(s) above for measurements and observations.    Preliminary    Medications:  sodium chloride     feeding supplement (OSMOLITE 1.5 CAL) 1,000 mL (06/23/21 0232)   iron sucrose      apixaban  2.5 mg Per Tube BID   atorvastatin  40 mg Per Tube Daily   collagenase   Topical Daily   darbepoetin (ARANESP) injection - DIALYSIS  150 mcg Intravenous Q Mon-HD   feeding supplement (PROSource TF)  45 mL Per Tube Daily   gabapentin  100 mg Per Tube QHS   insulin aspart  0-15 Units Subcutaneous Q4H   midodrine  10 mg Per Tube TID WC   mirtazapine  7.5 mg Per Tube QHS   multivitamin  1 tablet Per Tube QHS   pantoprazole sodium  40 mg  Per Tube Daily   sodium chloride flush  3 mL Intravenous Q12H   thiamine  100  mg Per Tube Daily   venlafaxine  37.5 mg Per Tube BID

## 2021-06-23 NOTE — Progress Notes (Signed)
Inpatient Diabetes Program Recommendations  AACE/ADA: New Consensus Statement on Inpatient Glycemic Control (2015)  Target Ranges:  Prepandial:   less than 140 mg/dL      Peak postprandial:   less than 180 mg/dL (1-2 hours)      Critically ill patients:  140 - 180 mg/dL   Lab Results  Component Value Date   GLUCAP 104 (H) 06/23/2021   HGBA1C 5.1 05/28/2021    Review of Glycemic Control Results for Amy [name], Amy [name] (MRN [id number]) as of 06/23/2021 14:11  Ref. Range 06/22/2021 11:15 06/22/2021 16:45 06/22/2021 20:23 06/22/2021 23:58 06/23/2021 00:31 06/23/2021 00:50 06/23/2021 04:01 06/23/2021 08:25  Glucose-Capillary Latest Ref Range: 70 - 99 mg/dL 142 (H) 114 (H) 151 (H) 58 (L) 66 (L) 86 97 104 (H)   Current orders for Inpatient glycemic control:  Novolog moderate q 4 hours Osmolite 50 ml/hr Inpatient Diabetes Program Recommendations:   Please consider reducing Novolog correction to Very sensitive (0-6 units) q 4 hours.    Thanks,  Adah Perl, RN, BC-ADM Inpatient Diabetes Coordinator Pager [phone]  (8a-5p)

## 2021-06-23 NOTE — Evaluation (Signed)
Occupational Therapy Evaluation/Discharge Patient Details Name: Amy [name] MRN: [id number] DOB: 1946/03/26 Today's Date: 06/23/2021    History of Present Illness Pt is a 75 y.o. female admitted from short-term rehab at SNF on 05/19/21 with dark stool and fecal occult. S/p EGD and sigmoidoscopy which showed stercoral ulcers. Plan for PEG tube placement 8/4. Pt with decline in the past few months with failure to thrive, acute metabolic encephalopathy. 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 PMH includes ESRD (on HD), HTN, CVA (residual L-side hemiparesis).   Clinical Impression   PTA, pt was at SNF and requires assistance for ADLs and transfers. Pt continues to requiring Total A for all ADLs and bed mobility. Pt with bowel incontinence and requiring Total A for peri care at bed level. Prior to eval, nursing staffing having returned pt to bed using maximove - pt tolerating sitting in the recliner. Continue to recommend staff to have pt OOB for optimizing sitting tolerance in preparation for HD. Recommend dc to SNF and will sign off. Thank you.     Follow Up Recommendations  SNF    Equipment Recommendations  None recommended by OT    Recommendations for Other Services       Precautions / Restrictions Precautions Precautions: Fall;Other (comment) Precaution Comments: bladder/bowel incontinence; L-side hemiplegia, L-side inattention; hallucinating      Mobility Bed Mobility Overal bed mobility: Needs Assistance Bed Mobility: Rolling Rolling: Total assist         General bed mobility comments: Total A for rolling with peri care    Transfers                 General transfer comment: RN staff having recently transfers back to bed using maximove after sitting in recliner    Balance                                           ADL either performed or assessed with clinical judgement   ADL Overall ADL's : Needs assistance/impaired                                        General ADL Comments: Total A     Vision Patient Visual Report: Other (comment) (R gaze) Additional Comments: Significant R gaze. Pt stating "I can barely see anything"     Perception     Praxis      Pertinent Vitals/Pain Pain Assessment: Faces Faces Pain Scale: Hurts a little bit Pain Location: Grimacing/moaning/guarding with BUE/BLE/cervical ROM and repositioning Pain Descriptors / Indicators: Grimacing;Guarding Pain Intervention(s): Monitored during session;Limited activity within patient's tolerance;Repositioned     Hand Dominance     Extremity/Trunk Assessment Upper Extremity Assessment Upper Extremity Assessment: LUE deficits/detail LUE Deficits / Details: No active movement noted in LUE, significant tone, flexor/add/IR contracture   Lower Extremity Assessment Lower Extremity Assessment: Defer to PT evaluation   Cervical / Trunk Assessment Cervical / Trunk Assessment: Kyphotic   Communication Communication Communication: Expressive difficulties;Receptive difficulties   Cognition Arousal/Alertness: Awake/alert Behavior During Therapy: Anxious Overall Cognitive Status: No family/caregiver present to determine baseline cognitive functioning                                 General Comments:  Pt benefiting from calm, positive cues. Perseverating on statement "ouch" with any movement. Answering certain questions and pariticipating in conversation; poor attention and stating tangiental things like "is betty here now? oh hi betty!"   General Comments       Exercises Exercises: General Upper Extremity General Exercises - Upper Extremity Elbow Flexion: AAROM;Right;10 reps;Supine Elbow Extension: AAROM;Right;10 reps;Supine Wrist Flexion: AAROM;Right;10 reps;Supine Wrist Extension: AAROM;Right;10 reps;Supine   Shoulder Instructions      Home Living Family/patient expects to be discharged to:: Skilled nursing  facility                                        Prior Functioning/Environment Level of Independence: Needs assistance  Gait / Transfers Assistance Needed: Suspect dependent for all mobility based on L-side hemiplegia and associated contractures ADL's / Homemaking Assistance Needed: Suspect dependent for all ADL tasks with assist from staff   Comments: New evaluation to increase activity tolerance in preparation for sititng in HD chair        OT Problem List: Decreased strength;Decreased range of motion;Decreased activity tolerance;Impaired vision/perception;Impaired balance (sitting and/or standing);Decreased coordination;Decreased cognition;Decreased safety awareness;Decreased knowledge of use of DME or AE;Cardiopulmonary status limiting activity;Impaired tone;Pain      OT Treatment/Interventions:      OT Goals(Current goals can be found in the care plan section) Acute Rehab OT Goals Patient Stated Goal: None stated OT Goal Formulation: Patient unable to participate in goal setting Time For Goal Achievement: 07/07/21 Potential to Achieve Goals: Good  OT Frequency:     Barriers to D/C:            Co-evaluation PT/OT/SLP Co-Evaluation/Treatment: Yes Reason for Co-Treatment: For patient/therapist safety;Complexity of the patient's impairments (multi-system involvement);To address functional/ADL transfers   OT goals addressed during session: ADL's and self-care      AM-PAC OT "6 Clicks" Daily Activity     Outcome Measure Help from another person eating meals?: Total Help from another person taking care of personal grooming?: Total Help from another person toileting, which includes using toliet, bedpan, or urinal?: Total Help from another person bathing (including washing, rinsing, drying)?: Total Help from another person to put on and taking off regular upper body clothing?: Total Help from another person to put on and taking off regular lower body clothing?:  Total 6 Click Score: 6   End of Session Nurse Communication: Mobility status  Activity Tolerance: Patient tolerated treatment well Patient left: in bed;with call bell/phone within reach  OT Visit Diagnosis: Unsteadiness on feet (R26.81);Other abnormalities of gait and mobility (R26.89);Muscle weakness (generalized) (M62.81);Feeding difficulties (R63.3);Hemiplegia and hemiparesis;Pain                Time: [id number] OT Time Calculation (min): 22 min Charges:  OT General Charges $OT Visit: 1 Visit OT Evaluation $OT Eval Moderate Complexity: 1 Mod  Urho Rio MSOT, OTR/L Acute Rehab Pager: [phone] Office: [place] 06/23/2021, 4:34 PM

## 2021-06-23 NOTE — Progress Notes (Signed)
[facility] PROGRESS NOTE  TIWANA CHAVIS VXB:[id number] DOB: 1946/01/11 DOA: 05/19/2021 PCP: [name], MD  Status:Inpatient  Remains inpatient appropriate because:Unsafe d/c plan, IV treatments appropriate due to intensity of illness or inability to take PO, and Inpatient level of care appropriate due to severity of illness  Dispo: The patient is from: Home              Anticipated d/c is to: SNF              Patient currently is medically stable to d/c.   Difficult to place patient Yes              Barriers to discharge: Dialysis patient with markedly decreased mobility-we will need to prove tolerance of sitting in chair for hemodialysis while here as well as tolerating sitting in wheelchair for up to 1 hour x 2 to replicate transport process.  Cannot discharge and proceed with outpatient hemodialysis unless this is accomplished.      Level of care: Med-Surg  Code Status:  Family Communication:  DVT prophylaxis:  COVID vaccination status:    HPI: 75 y.o. 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  Patient presented to the ED on 7/25 with dark stool.  On presentation, hemoglobin was 9.6; GI was consulted.  She underwent EGD which was normal and flexible sigmoidoscopy which showed nonbleeding distal rectal ulcers/stercoral ulcers.  Subsequently, GI signed off.  Neurology was also consulted for altered mental status; MRI brain was similar to prior MRI at [facility] with a large right MCA stroke.   Nephrology has been following for dialysis needs; nephrology is stating that patient will not be accepted back to outpatient dialysis at this time due to her underlying  confusion, weakness and inability to sit in a dialysis recliner as an outpatient.   Due to her poor oral intake, family wanted a PEG tube placement.  IR tried but was unable to place because of overlying transverse colon.   General surgery was consulted for G-tube placement but family currently undecided.  She has an NG tube in place.   Palliative care following for goals of care discussion.  Subjective: Awakened during dialysis.  Able to verbally respond but conversation not consistent with conversation at hand.  During palpation of abdomen patient perseverated on the word ouch.  Objective: Vitals:   06/23/21 0621 06/23/21 0806  BP: (!) 127/54 120/76  Pulse: (!) 101 (!) 112  Resp: 20 18  Temp: 98.7 F (37.1 C) 98.2 F (36.8 C)  SpO2: 98% 100%    Intake/Output Summary (Last 24 hours) at 06/23/2021 0840 Last data filed at 06/23/2021 0205 Gross per 24 hour  Intake 395 ml  Output 0 ml  Net 395 ml   Filed Weights   06/16/21 1941 06/18/21 1610 06/18/21 1925  Weight: 62 kg 62 kg 60 kg    Exam:  Constitutional: NAD, calm, mildly uncomfortable after abdominal exam Respiratory: Anterior lung sounds are clear to auscultation, no increased work of breathing.  Pulse oximetry 95% on room air Cardiovascular: Regular rate and rhythm, no murmurs / rubs / gallops. No extremity edema.  Extremities warm to touch.  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  Bowel sounds positive. LBM 8/27 Neurologic: CN 2-12 grossly intact for deviation of eyes to right bilaterally. Sensation intact, Strength 3/5 on the right.  With chronic left hemiparesis from prior CVA. Psychiatric: Awake and attempts to verbally communicate although oriented to name only.   Assessment/Plan: Acute problems: Acute on chronic GI bleeding from stercoral ulcers -Patient presented with anemia,  underwent EGD which was normal and flex sigmoidoscopy with clean base, nonbleeding distal  rectal ulcers (stercoral ulcers). Gastroenterology now signed off on 05/22/21. -Hemoglobin remains stable between 7 and 8.  No active bleeding at this time. 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   EEG with cortical dysfunction right hemisphere likely secondary to underlying structural abnormality/stroke and moderate encephalopathy with no seizure activity or epileptiform discharges.   Lipid panel and hemoglobin A1c within normal limits.  Continue Lipitor Diet as per SLP recommendations.  Extremely poor to no oral intake Cortrak tube in place for enteral feedings    ESRD on HD Per Nephro Patient is not a candidate for continued outpatient HD given her confusion, weakness and inability to sit up in a chair for HD outpatient.  Nephrology suggested transition to hospice care. Palliative care met with family on 06/05/2021: Family wanted to continue to treat the treatable with hopes for improvement and to continue hemodialysis as long as patient can tolerate. Currently not a candidate for outpatient HD therefore disposition very difficult.  We will research to see if there are any SNFs that perform facility-based hemodialysis or are aware of outpatient facilities that will take the patient on a stretcher.   Hyperkalemia/hypophosphatemia Had recurrence  overnight requiring Lokelma as well as IV cardioprotective medications. Management otherwise per nephrology   Hypotension -Blood pressure currently is stable on midodrine.   Failure to thrive /Generalized deconditioning  /very poor oral intake PEG tube was desired by family but could not be placed because of overlying transverse colon.   Currently getting Cortrak feeding.  Patient's family insist on continuing Coretrak feeding.  However, patient cannot be placed at any SNF with a core track in place.   General surgery previously declined to place feeding tube that patient is not a candidate for outpatient hemodialysis.  Will need to discuss with general surgery this week to clarify  Abdominal pain/?  Constipation Patient was complaining of abdominal pain with palpation this morning especially over right mid and upper quadrant and there appeared to be some fullness there I suspect patient may be constipated and this could be contributing to her poor oral intake Last bowel movement on the 27th but this still does not exclude significant colonic retention of stool Obtain portable KUB Receiving extremely low dose Dilaudid 0.5 mg every 6 hours prn for severe pain as well as oxycodone 5 mg every 6 hours  prn moderate pain-prior to admission she was on oxycodone 5 every 12 hrs so we will decrease frequency of this and discontinue IV Dilaudid. Patient was on 40 g Chronulac daily prn.  Will start scheduled Chronulac 20 g daily  Type 2 diabetes mellitus Episodes of hypoglycemia -Hemoglobin A1c 5.1.  Diet controlled at baseline. -Patient had episodes of hypoglycemia for which she required dextrose drip briefly.  Currently receiving tube feeding.    Chronic thrombus left upper extremity Continue Eliquis Elevate as appropriate  Stage II mid coccyx pressure injury, present on admission Continue wound care    GERD Could also be contributing to patient's poor oral intake Increased frequency of  Protonix to BID sitter adding Pepcid     Data Reviewed: Basic Metabolic Panel: Recent Labs  Lab 06/17/21 0129 06/18/21 0427 06/19/21 0700 06/20/21 0541 06/22/21 1742 06/23/21 0723  NA 134* 134* 133* 132* 133* 131*  K 4.3 4.3 4.7 5.2* 6.6* 6.7*  CL 97* 96* 95* 94* 97* 93*  CO2 28 31 31 30 28  32  GLUCOSE 127* 119* 189* 152* 94 99  BUN 20 31* 28* 37* 41* 47*  CREATININE 3.42* 4.05* 3.61* 3.87* 3.82* 4.08*  CALCIUM 7.5* 7.8* 7.4* 7.5* 7.8* 7.9*  PHOS 2.4* 1.8* 1.9* 2.7 2.6  --    Liver Function Tests: Recent Labs  Lab 06/17/21 0129 06/18/21 0427 06/19/21 0700 06/20/21 0541 06/22/21 1742  ALBUMIN 2.2* 2.1* 1.8* 1.9* 1.8*   No results for input(s): LIPASE, AMYLASE in the last 168 hours. No results for input(s): AMMONIA in the last 168 hours. CBC: Recent Labs  Lab 06/22/21 1742 06/23/21 0723  WBC 11.8*  --   HGB 6.8* 8.4*  HCT 21.7* 26.0*  MCV 95.6  --   PLT 245  --    Cardiac Enzymes: No results for input(s): CKTOTAL, CKMB, CKMBINDEX, TROPONINI in the last 168 hours. BNP (last 3 results) No results for input(s): BNP in the last 8760 hours.  ProBNP (last 3 results) No results for input(s): PROBNP in the last 8760 hours.  CBG: Recent Labs  Lab 06/22/21 2358 06/23/21 0031 06/23/21 0050 06/23/21 0401 06/23/21 0825  GLUCAP 58* 66* 86 97 104*    No results found for this or any previous visit (from the past 240 hour(s)).   Studies: VAS [place] UPPER EXTREMITY VENOUS DUPLEX  Result Date: 06/22/2021 UPPER VENOUS STUDY  Patient Name:  Amy [name] Puleo  Date of Exam:   06/22/2021 Medical Rec #: [id number]         Accession #:    [id number] Date of Birth: 1946-01-16         Patient Gender: F Patient Age:   60 years Exam Location:  [facility] Procedure:      VAS [place] UPPER EXTREMITY VENOUS DUPLEX Referring Phys: Terrilee Croak --------------------------------------------------------------------------------  Indications: Swelling Limitations: Poor ultrasound/tissue  interface, body habitus and Severe swelling. Comparison Study: No prior left UEV on file Performing Technologist: Sharion Dove RVS  Examination Guidelines: A complete evaluation includes B-mode imaging, spectral Doppler, color Doppler, and power Doppler as needed of all accessible portions of each vessel. Bilateral testing is considered an integral part of a complete examination. Limited examinations for reoccurring indications may be performed as noted.  Right Findings: +----------+------------+---------+-----------+----------+-------+ RIGHT     CompressiblePhasicitySpontaneousPropertiesSummary +----------+------------+---------+-----------+----------+-------+ Subclavian               Yes       Yes                      +----------+------------+---------+-----------+----------+-------+  Left Findings: +----------+------------+---------+-----------+----------+---------------------+ LEFT      CompressiblePhasicitySpontaneousProperties       Summary        +----------+------------+---------+-----------+----------+---------------------+ IJV                      Yes       Yes                                    +----------+------------+---------+-----------+----------+---------------------+ Subclavian               Yes       Yes                                    +----------+------------+---------+-----------+----------+---------------------+ Axillary                 Yes       Yes                                    +----------+------------+---------+-----------+----------+---------------------+ Brachial                 Yes       Yes                not all portions                                                             visualized       +----------+------------+---------+-----------+----------+---------------------+ Radial                                              patent by color, not                                                          all  portions                                                               visualized       +----------+------------+---------+-----------+----------+---------------------+ Cephalic      Full                                    Not all portions  visualized       +----------+------------+---------+-----------+----------+---------------------+ Basilic                                                Not visualized     +----------+------------+---------+-----------+----------+---------------------+  *See table(s) above for measurements and observations.    Preliminary     Scheduled Meds:  apixaban  2.5 mg Per Tube BID   atorvastatin  40 mg Per Tube Daily   collagenase   Topical Daily   darbepoetin (ARANESP) injection - DIALYSIS  150 mcg Intravenous Q Mon-HD   feeding supplement (PROSource TF)  45 mL Per Tube Daily   gabapentin  100 mg Per Tube QHS   insulin aspart  0-15 Units Subcutaneous Q4H   midodrine  10 mg Per Tube TID WC   mirtazapine  7.5 mg Per Tube QHS   multivitamin  1 tablet Per Tube QHS   pantoprazole sodium  40 mg Per Tube Daily   sodium chloride flush  3 mL Intravenous Q12H   thiamine  100 mg Per Tube Daily   venlafaxine  37.5 mg Per Tube BID   Continuous Infusions:  sodium chloride     feeding supplement (OSMOLITE 1.5 CAL) 1,000 mL (06/23/21 0232)   iron sucrose      Principal Problem:   GI bleed Active Problems:   Essential hypertension   Type 2 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, with long-term current use of insulin (HCC)   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   ESRD on dialysis ([place])   Dyslipidemia   Hypokalemia   Stage 2 skin ulcer of sacral region [facility])   Cerebral thrombosis with cerebral infarction   Protein-calorie malnutrition, severe   Consultants: Vascular surgery Nephrology Gastroenterology Neurology Palliative  medicine General surgery  Procedures: EEG Echocardiogram Cortrack EGD  Antibiotics: Ceftriaxone 8/3 through 8/5   Time spent: 45 minutes    Erin Hearing ANP  [facility] 7 am - 330 pm/M-F for direct patient care and secure chat Please refer to Amion for contact info 34  days

## 2021-06-23 NOTE — Progress Notes (Signed)
Alerted by dialysis nurse , K  6.4   will plan 0 K dialysis x 1 hr and recheck K stat    if K < 5  change to 2 K  , if > 5  change to 1 K

## 2021-06-24 DIAGNOSIS — K922 Gastrointestinal hemorrhage, unspecified: Secondary | ICD-10-CM | POA: Diagnosis not present

## 2021-06-24 LAB — GLUCOSE, CAPILLARY
Glucose-Capillary: 100 mg/dL — ABNORMAL HIGH (ref 70–99)
Glucose-Capillary: 102 mg/dL — ABNORMAL HIGH (ref 70–99)
Glucose-Capillary: 129 mg/dL — ABNORMAL HIGH (ref 70–99)
Glucose-Capillary: 46 mg/dL — ABNORMAL LOW (ref 70–99)
Glucose-Capillary: 49 mg/dL — ABNORMAL LOW (ref 70–99)
Glucose-Capillary: 61 mg/dL — ABNORMAL LOW (ref 70–99)
Glucose-Capillary: 74 mg/dL (ref 70–99)
Glucose-Capillary: 78 mg/dL (ref 70–99)
Glucose-Capillary: 95 mg/dL (ref 70–99)

## 2021-06-24 MED ORDER — DEXTROSE 50 % IV SOLN
INTRAVENOUS | Status: AC
  Filled 2021-06-24: qty 50

## 2021-06-24 MED ORDER — SODIUM CHLORIDE 0.9 % IV SOLN
100.0000 mg | INTRAVENOUS | Status: DC
  Administered 2021-06-25: 100 mg via INTRAVENOUS
  Filled 2021-06-24 (×5): qty 5

## 2021-06-24 MED ORDER — DEXTROSE 50 % IV SOLN
25.0000 g | INTRAVENOUS | Status: AC
  Administered 2021-06-24: 25 g via INTRAVENOUS

## 2021-06-24 MED ORDER — DEXTROSE 10 % IV SOLN: INTRAVENOUS | Status: DC

## 2021-06-24 MED ORDER — DEXTROSE 50 % IV SOLN
12.5000 g | INTRAVENOUS | Status: AC
  Administered 2021-06-24: 12.5 g via INTRAVENOUS
  Filled 2021-06-24: qty 50

## 2021-06-24 NOTE — Progress Notes (Signed)
Hypoglycemic Event  CBG: Results for Amy [name], Amy [name] (MRN [id number]) as of 06/24/2021 04:41  Ref. Range 06/24/2021 04:31  Glucose-Capillary Latest Ref Range: 70 - 99 mg/dL 49 (L)    Treatment: D50 25 mL (12.5 gm)  Symptoms: None  Follow-up CBG: Time:  CBG Result: Results for Amy [name], Amy [name] (MRN [id number]) as of 06/25/2021 02:06  Ref. Range 06/24/2021 05:06  Glucose-Capillary Latest Ref Range: 70 - 99 mg/dL 129 (H)    Possible Reasons for Event: Inadequate meal intake  Comments/MD notified:    Viviano Simas

## 2021-06-24 NOTE — Progress Notes (Signed)
Hypoglycemic Event  CBG: Results for [name], Amy [name] (MRN [id number]) as of 06/24/2021 00:46  Ref. Range 06/24/2021 00:36  Glucose-Capillary Latest Ref Range: 70 - 99 mg/dL 46 (L)    Treatment: D50 25 mL (12.5 gm)  Symptoms: None  Follow-up CBG: Time:  CBG Result: Results for [name], Amy [name] (MRN [id number]) as of 06/24/2021 01:14  Ref. Range 06/24/2021 01:12  Glucose-Capillary Latest Ref Range: 70 - 99 mg/dL 95    Possible Reasons for Event: Inadequate meal intake  Comments/MD notified:    Viviano Simas

## 2021-06-24 NOTE — Progress Notes (Addendum)
BG checked and was 61. IVF D10 @ 50 started per order. Will administer 50% dextrose iv push and recheck in 15 minutes. MD aware.  1115 BG 102. MD aware.  Oral medications not administered secondary to Cortrack clogging. Patient is not appropriate for orals at this time. MD made aware.

## 2021-06-24 NOTE — Progress Notes (Signed)
PROGRESS NOTE  Amy [name]  DOB: Mar 25, 1946  PCP: [name], MD [id number]  DOA: 05/19/2021  LOS: 57 days  Hospital Day: 37   Chief Complaint  Patient presents with   Rectal Bleeding    Brief narrative: Amy [name] is a 75 y.o. 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  Patient presented to the ED on 7/25 with dark stool.  On presentation, hemoglobin was 9.6; GI was consulted.  She underwent EGD which was normal and flexible sigmoidoscopy which showed nonbleeding distal rectal ulcers/stercoral ulcers.  Subsequently, GI signed off. Neurology was also consulted for altered mental status; MRI brain was similar to prior MRI at [facility] with a large right MCA stroke.   Nephrology has been following for dialysis needs; nephrology is stating that patient will not be accepted back to outpatient dialysis at this time due to her underlying confusion, weakness and inability to sit in a dialysis recliner as an outpatient.   Due to her poor oral intake, family wanted a PEG tube placement.  IR tried but was unable to place because of overlying transverse colon. General surgery was consulted for G-tube placement but family currently undecided.  She has an NG tube in place.   Palliative care following for goals of care discussion.   Subjective: Patient was seen and examined this morning.  Yesterday, her core track tube was clogged and hence she could not get to feeding.  Overnight she was hypoglycemic.  At the time of my evaluation this morning, patient was less awake. Blood sugar level was low in the 60s.  D10 drip was initiated for that reason.  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 Gastroenterology signed off on 05/22/21. -Hemoglobin stable.   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 with no drastic changes which is notable for a large right MCA stroke with advanced microvascular ischemic changes of the white matter; MRA head degraded but did not show occlusion and bilateral carotid ultrasound revealed R ICA and LICA with [id number] stenosis.  EEG with cortical dysfunction right hemisphere likely secondary to underlying structural abnormality/stroke and moderate encephalopathy with no seizure activity or epileptiform discharges.  Hemoglobin A1c 5.1, LDL 68.  No further recommendations per neurology and signed off 05/28/2021. Continue atorvastatin 40 mg p.o. daily -Diet as per SLP recommendations.  Currently on dysphagia 3 diet and core track feeding.    ESRD on HD -Per Nephro Patient is not a candidate for continued outpatient HD given her confusion, weakness and inability to sit up in a chair for HD outpatient.  -Nephrology suggested transition to hospice care. -As per palliative care discussion with family on 06/05/2021: Family wanted to continue to treat the treatable with hopes for improvement and to continue hemodialysis as  long as patient can tolerate. -Patient will need either LTAC placement or another outpatient hemodialysis unit needs to be found.  Hyperkalemia/hypophosphatemia -Management per nephrology. Recent Labs  Lab  06/18/21 0427 06/19/21 0700 06/20/21 0541 06/22/21 1742 06/23/21 0723 06/23/21 1036 06/23/21 1048  K 4.3 4.7 5.2* 6.6* 6.7* 3.1* 3.5  PHOS 1.8* 1.9* 2.7 2.6  --   --   --    Hypotension -Blood pressure currently is stable on midodrine.  Failure to thrive /Generalized deconditioning  /very poor oral intake -PEG tube was desired by family but could not be placed because of overlying transverse colon.   -Currently getting Cortrak feeding.  Patient's family insist on continuing Coretrak feeding.  However, patient cannot be placed at any SNF with a core track in place.  We will discuss with general surgery on Monday again to see if they would consider G-tube placement. -Yesterday, her core track tube was clogged and hence she could not get to feeding.  Overnight she was hypoglycemic.  At the time of my evaluation this morning, patient was less awake. Blood sugar level was low in the 60s.  D10 drip was initiated for that reason.  -Discussed with IR.  Unable to change Coretrak today. Coretrak team to change it tomorrow.  Type 2 diabetes mellitus Episodes of hypoglycemia -Hemoglobin A1c 5.1.  Diet controlled at baseline. -Patient had episodes of hypoglycemia for which she required dextrose drip briefly.  Currently receiving tube feeding.    Chronic thrombus left upper extremity -Continue Eliquis -Left upper extremity seems more swollen today.  Obtain ultrasound duplex.   Stage II mid coccyx pressure injury, present on admission -Continue wound care    GERD -Continue PPI   Mobility: Poor mobility Code Status:   Code Status: DNR  Nutritional status: Body mass index is 20.72 kg/m. Nutrition Problem: Severe Malnutrition Etiology: chronic illness (ESRD on HD, prior CVA) Signs/Symptoms: percent weight loss, severe muscle depletion, energy intake < or equal to 75% for > or equal to 1 month Percent weight loss: 16.4 % Diet:  Diet Order             DIET DYS 3 Room service appropriate?  Yes; Fluid consistency: Thin  Diet effective now                  DVT prophylaxis:  apixaban (ELIQUIS) tablet 2.5 mg Start: 06/15/21 1115 Place TED hose Start: 05/19/21 1706 apixaban (ELIQUIS) tablet 2.5 mg   Antimicrobials: None at this time Fluid: None Consultants: Nephrology, palliative care Family Communication: Called and updated patient's goddaughter Ms. Joelene Millin on 8/24.  She is still hopeful that patient will recover.  She is expecting to have an outpatient dialysis facility available where she can get dialysis even when she cannot sit up for it.  She is expecting to find a skilled nursing facility where patient could go with feeding tube in place.  I explained her that patient has high risk of developing more complications, overall prognosis and very slim chance of getting back to her quality of life.  Ms. Joelene Millin would like to continue current care at this time. 8/30, I reconsulted palliative care team.  Status is: Inpatient  Remains inpatient appropriate because: Poor prognosis.  Unable to do outpatient dialysis.  Dispo: The patient is from: Home              Anticipated d/c is to: Unclear at this time              Patient  currently is not medically stable to d/c.   Difficult to place patient No     Infusions:   sodium chloride     dextrose 50 mL/hr at 06/24/21 1056   feeding supplement (OSMOLITE 1.5 CAL) 1,000 mL (06/23/21 0232)   [START ON 06/25/2021] iron sucrose      Scheduled Meds:  apixaban  2.5 mg Per Tube BID   atorvastatin  40 mg Per Tube Daily   collagenase   Topical Daily   darbepoetin (ARANESP) injection - DIALYSIS  150 mcg Intravenous Q Mon-HD   dextrose       feeding supplement (PROSource TF)  45 mL Per Tube Daily   gabapentin  100 mg Per Tube QHS   insulin aspart  0-6 Units Subcutaneous Q4H   lactulose  20 g Oral Daily   midodrine  10 mg Per Tube TID WC   mirtazapine  7.5 mg Per Tube QHS   multivitamin  1 tablet Per Tube QHS   pantoprazole  sodium  40 mg Per Tube Daily   sodium chloride flush  3 mL Intravenous Q12H   thiamine  100 mg Per Tube Daily   venlafaxine  37.5 mg Per Tube BID    Antimicrobials: Anti-infectives (From admission, onward)    Start     Dose/Rate Route Frequency Ordered Stop   05/29/21 1622  ceFAZolin (ANCEF) 2-4 GM/100ML-% IVPB       Note to Pharmacy: Lytle Butte   : cabinet override      05/29/21 1622 05/30/21 0429   05/29/21 0000  cefTRIAXone (ROCEPHIN) 1 g in sodium chloride 0.9 % 100 mL IVPB  Status:  Discontinued        1 g 200 mL/hr over 30 Minutes Intravenous Every 24 hours 05/28/21 1801 05/28/21 1801   05/28/21 1830  cefTRIAXone (ROCEPHIN) 1 g in sodium chloride 0.9 % 100 mL IVPB        1 g 200 mL/hr over 30 Minutes Intravenous Every 24 hours 05/28/21 1801 05/30/21 1907   05/28/21 1630  ceFAZolin (ANCEF) IVPB 2g/100 mL premix        2 g 200 mL/hr over 30 Minutes Intravenous To Radiology 05/28/21 1537 05/29/21 1630       PRN meds: sodium chloride, acetaminophen, fentaNYL, midazolam, ondansetron (ZOFRAN) IV, oxyCODONE, sodium chloride flush   Objective: Vitals:   06/24/21 0451 06/24/21 0839  BP: (!) 168/62 132/66  Pulse: 70 99  Resp:  18  Temp: 98.2 F (36.8 C) 98.1 F (36.7 C)  SpO2: 94% 95%    Intake/Output Summary (Last 24 hours) at 06/24/2021 1456 Last data filed at 06/24/2021 1300 Gross per 24 hour  Intake 168 ml  Output 0 ml  Net 168 ml   Filed Weights   06/16/21 1941 06/18/21 1610 06/18/21 1925  Weight: 62 kg 62 kg 60 kg   Weight change:  Body mass index is 20.72 kg/m.   Physical Exam: General exam: Elderly African-American female.  Not in distress at rest.  Grimaces on pressing left upper extremity. Skin: No rashes, lesions or ulcers. HEENT: Atraumatic, normocephalic, no obvious bleeding.  NG tube feeding in place Lungs: Clear to auscultation bilaterally CVS: Regular rate and rhythm, no murmur GI/Abd soft, nontender, nondistended, bowel sound present CNS:  Less awake today, tries to open eyes on touch. Has baseline deficits including left hemiparesis and dysphagia because of stroke. Psychiatry: Depressed look Extremities: No pedal edema, no calf tenderness.  Chronically swollen left upper extremity.  Data Review: I have  personally reviewed the laboratory data and studies available.  Recent Labs  Lab 06/22/21 1742 06/23/21 0723 06/23/21 1048  WBC 11.8*  --   --   HGB 6.8* 8.4* 10.2*  HCT 21.7* 26.0* 30.0*  MCV 95.6  --   --   PLT 245  --   --    Recent Labs  Lab 06/18/21 0427 06/19/21 0700 06/20/21 0541 06/22/21 1742 06/23/21 0723 06/23/21 1036 06/23/21 1048  NA 134* 133* 132* 133* 131*  --  133*  K 4.3 4.7 5.2* 6.6* 6.7* 3.1* 3.5  CL 96* 95* 94* 97* 93*  --  93*  CO2 '31 31 30 28 '$ 32  --   --   GLUCOSE 119* 189* 152* 94 99  --  113*  BUN 31* 28* 37* 41* 47*  --  20  CREATININE 4.05* 3.61* 3.87* 3.82* 4.08*  --  1.90*  CALCIUM 7.8* 7.4* 7.5* 7.8* 7.9*  --   --   PHOS 1.8* 1.9* 2.7 2.6  --   --   --     F/u labs ordered Unresulted Labs (From admission, onward)    None       Signed, Terrilee Croak, MD [facility] 06/24/2021

## 2021-06-24 NOTE — Progress Notes (Addendum)
Subjective: No complaints ,minimal verbal response///yesterday  told  me she does not like sitting in recliner, today  Eyes open but would not respond to my request for recliner dialysis tomorrow ,we will try tomor  HD recliner.  Objective Vital signs in last 24 hours: Vitals:   06/23/21 2030 06/24/21 0445 06/24/21 0451 06/24/21 0839  BP: 92/72 (!) 112/46 (!) 168/62 132/66  Pulse: (!) 103 (!) 101 70 99  Resp:    18  Temp: 98.9 F (37.2 C) 98.5 F (36.9 C) 98.2 F (36.8 C) 98.1 F (36.7 C)  TempSrc: Oral Oral Oral Axillary  SpO2: 98% 97% 94% 95%  Weight:      Height:       Weight change:   Physical Exam: General= chronically ill elderly female, more lethargic today with no verbal response and overall movements , blinks eyes to voice  Heart: RRR, No MRG Lungs: Cta Anter. Rm Air Abdomen: NABS, Obese, NT,ND Extremities: trace pedal edema, and 1 + hips/thighs  and Bilat upper extem with L arm contracture  Dialysis Access: LUA AVF POs . bruit    OP Dialysis Orders: AF MWF  3h 34mn 400/500    62.5kg    3K/2.5 Ca    P2  AVG HeRO   Hep none - Mircera 50 q 2 wks  (7/13)  Venofer 50 q wk - Parsabiv 573mIV q HDlower extremity edema, left upper arm swelling, nontender dialysis Access: Left AVG    Problem/Plan: AMS/FTT: Providers and palliative care met w/ family and discussed pt's decline. Due to declining mental and physical status we don't anticipate she will be able to do OP dialysis.  Pt will have to be able to sit in a chair for 4-6 hrs to qualify for OP HD w/ our group. If she is able to sit for dialysis and cooperate, then the next step would to re-CLIP the patient as her OP HD unit will not take her back.  Tolerated dialysis x1 in chair last week. HD in chair again today    2.   Melena/Rectal bleeding - GI consulted. Hx rectal ulcers.  EGD 7/27 normal, flex sig with mucosal ulceration c/w sterocoral ulcers, non bleeding.  Last hgb 10.2=8/29 stable ESRD: Schedule varying due to  staff shortage in hosp. currently MWF .K3.5 yesterday will use 4.0K bath tomor  Prior issues with cannulation on 8/20 , [facility], but no issues since using [facility].  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  mild Increased volume noted on exam, getting under dry weight, will need to lower on d/c.  Attempt UF 2L  HD  Nutrition: Albumin 2.1 > 1.8 continue protein supps.  TF per RD once Cortrak in place.  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  Anemia  - last Hgb 10.2 Aranesp 150 mcg given 08/29 Tsat 27%, will start iron load. X3    with ferritin 1070.  Dispo: DNR/DNI. Poor prognosis. Palliative care has seen see #1 Appreciate their involvement. We do not belive dialysis is adding to her quality of life. Per family communication  family wishes to continue dialysis   DaErnest HaberPA-C [phone]/30/2022,10:52 AM  LOS: 35 days   Labs: Basic Metabolic Panel: Recent Labs  Lab 06/19/21 0700 06/20/21 0541 06/22/21 1742 06/23/21 0723 06/23/21 1036 06/23/21 1048  NA 133* 132* 133* 131*  --  133*  K 4.7 5.2* 6.6* 6.7* 3.1* 3.5  CL 95* 94* 97* 93*  --  93*  CO2 31 30 28  32  --   --   GLUCOSE 189* 152* 94 99  --  113*  BUN 28* 37* 41* 47*  --  20  CREATININE 3.61* 3.87* 3.82* 4.08*  --  1.90*  CALCIUM 7.4* 7.5* 7.8* 7.9*  --   --   PHOS 1.9* 2.7 2.6  --   --   --    Liver Function Tests: Recent Labs  Lab 06/19/21 0700 06/20/21 0541 06/22/21 1742  ALBUMIN 1.8* 1.9* 1.8*   No results for input(s): LIPASE, AMYLASE in the last 168 hours. No results for input(s): AMMONIA in the last 168 hours. CBC: Recent Labs  Lab 06/22/21 1742 06/23/21 0723 06/23/21 1048  WBC 11.8*  --   --   HGB 6.8* 8.4* 10.2*  HCT 21.7*  26.0* 30.0*  MCV 95.6  --   --   PLT 245  --   --    Cardiac Enzymes: No results for input(s): CKTOTAL, CKMB, CKMBINDEX, TROPONINI in the last 168 hours. CBG: Recent Labs  Lab 06/24/21 0112 06/24/21 0431 06/24/21 0506 06/24/21 0734 06/24/21 1045  GLUCAP 95 49* 129* 74 61*    Studies/Results: DG Abd Portable 1V  Result Date: 06/23/2021 CLINICAL DATA:  Check feeding catheter placement EXAM: PORTABLE ABDOMEN - 1 VIEW COMPARISON:  06/13/2021 FINDINGS: Feeding catheter has withdrawn slightly from the prior exam but remains in the midportion of the stomach. No obstructive changes are seen. IMPRESSION: Feeding catheter within the mid stomach. Electronically Signed   By: Inez Catalina M.D.   On: 06/23/2021 14:44   VAS [place] UPPER EXTREMITY VENOUS DUPLEX  Result Date: 06/23/2021 UPPER VENOUS STUDY  Patient Name:  Amy [name]  Date of Exam:   06/22/2021 Medical Rec #: [id number]         Accession #:    [id number] Date of Birth: 20-Jan-1946         Patient Gender: F Patient Age:   75 years Exam Location:  [facility] Procedure:      VAS [place] UPPER EXTREMITY VENOUS DUPLEX Referring Phys: Terrilee Croak --------------------------------------------------------------------------------  Indications: Swelling Limitations: Poor ultrasound/tissue interface, body habitus and Severe swelling. Comparison Study: No prior left UEV on file Performing Technologist: Sharion Dove RVS  Examination Guidelines: A complete evaluation includes B-mode imaging, spectral Doppler, color Doppler, and power Doppler as needed of all accessible portions of each vessel. Bilateral testing is considered an integral part of a complete examination. Limited examinations for reoccurring indications may be performed as noted.  Right Findings: +----------+------------+---------+-----------+----------+-------+ RIGHT     CompressiblePhasicitySpontaneousPropertiesSummary  +----------+------------+---------+-----------+----------+-------+ Subclavian               Yes       Yes                      +----------+------------+---------+-----------+----------+-------+  Left Findings: +----------+------------+---------+-----------+----------+---------------------+ LEFT      CompressiblePhasicitySpontaneousProperties       Summary        +----------+------------+---------+-----------+----------+---------------------+ IJV                      Yes       Yes                                    +----------+------------+---------+-----------+----------+---------------------+  Subclavian               Yes       Yes                                    +----------+------------+---------+-----------+----------+---------------------+ Axillary                 Yes       Yes                                    +----------+------------+---------+-----------+----------+---------------------+ Brachial                 Yes       Yes                not all portions                                                             visualized       +----------+------------+---------+-----------+----------+---------------------+ Radial                                              patent by color, not                                                          all portions                                                               visualized       +----------+------------+---------+-----------+----------+---------------------+ Cephalic      Full                                    Not all portions                                                             visualized       +----------+------------+---------+-----------+----------+---------------------+ Basilic                                                Not visualized     +----------+------------+---------+-----------+----------+---------------------+  *See table(s) above for  measurements and observations.  Diagnosing physician: Servando Snare MD Electronically signed by Servando Snare MD on  06/23/2021 at 5:31:32 PM.    Final    Medications:  sodium chloride     dextrose     feeding supplement (OSMOLITE 1.5 CAL) 1,000 mL (06/23/21 0232)   iron sucrose 100 mg (06/23/21 1218)    apixaban  2.5 mg Per Tube BID   atorvastatin  40 mg Per Tube Daily   collagenase   Topical Daily   darbepoetin (ARANESP) injection - DIALYSIS  150 mcg Intravenous Q Mon-HD   dextrose  12.5 g Intravenous STAT   dextrose       dextrose       feeding supplement (PROSource TF)  45 mL Per Tube Daily   gabapentin  100 mg Per Tube QHS   insulin aspart  0-6 Units Subcutaneous Q4H   lactulose  20 g Oral Daily   midodrine  10 mg Per Tube TID WC   mirtazapine  7.5 mg Per Tube QHS   multivitamin  1 tablet Per Tube QHS   pantoprazole sodium  40 mg Per Tube Daily   sodium chloride flush  3 mL Intravenous Q12H   thiamine  100 mg Per Tube Daily   venlafaxine  37.5 mg Per Tube BID

## 2021-06-24 NOTE — Plan of Care (Signed)
  Problem: Health Behavior/Discharge Planning: Goal: Ability to manage health-related needs will improve Outcome: Adequate for Discharge   Problem: Activity: Goal: Risk for activity intolerance will decrease Outcome: Adequate for Discharge   Problem: Coping: Goal: Level of anxiety will decrease Outcome: Adequate for Discharge

## 2021-06-24 NOTE — Plan of Care (Signed)
  Problem: Health Behavior/Discharge Planning: Goal: Ability to manage health-related needs will improve Outcome: Progressing   Problem: Clinical Measurements: Goal: Ability to maintain clinical measurements within normal limits will improve Outcome: Progressing   Problem: Activity: Goal: Risk for activity intolerance will decrease Outcome: Progressing   Problem: Coping: Goal: Level of anxiety will decrease Outcome: Progressing   

## 2021-06-24 NOTE — TOC Progression Note (Signed)
Transition of Care [facility]) - Progression Note    Patient Details  Name: ANTANAE KOEPP MRN: [id number] Date of Birth: May 12, 1946  Transition of Care [facility]) CM/SW Contact  Curlene Labrum, RN Phone Number: 06/24/2021, 2:50 PM  Clinical Narrative:    CM called and left a message with the patient's son to follow up regarding transitions of care needs for the patient.  Patient has Cortrak in place at this time and is working with PT/OT.  CM and MSW with DTP Team will continue to follow the patient for TOC needs.   Expected Discharge Plan: [place] Barriers to Discharge: Continued Medical Work up  Expected Discharge Plan and Services Expected Discharge Plan: [place] In-house Referral: Clinical Social Work     Living arrangements for the past 2 months: [place] (Patient came to hospital from [place])                                       Social Determinants of Health (SDOH) Interventions    Readmission Risk Interventions No flowsheet data found.

## 2021-06-25 ENCOUNTER — Inpatient Hospital Stay (HOSPITAL_COMMUNITY): Payer: Medicare Other

## 2021-06-25 DIAGNOSIS — Z515 Encounter for palliative care: Secondary | ICD-10-CM | POA: Diagnosis not present

## 2021-06-25 DIAGNOSIS — E43 Unspecified severe protein-calorie malnutrition: Secondary | ICD-10-CM | POA: Diagnosis not present

## 2021-06-25 DIAGNOSIS — R627 Adult failure to thrive: Secondary | ICD-10-CM | POA: Diagnosis not present

## 2021-06-25 DIAGNOSIS — N186 End stage renal disease: Secondary | ICD-10-CM | POA: Diagnosis not present

## 2021-06-25 DIAGNOSIS — R638 Other symptoms and signs concerning food and fluid intake: Secondary | ICD-10-CM | POA: Diagnosis not present

## 2021-06-25 DIAGNOSIS — T85598A Other mechanical complication of other gastrointestinal prosthetic devices, implants and grafts, initial encounter: Secondary | ICD-10-CM | POA: Diagnosis not present

## 2021-06-25 DIAGNOSIS — D638 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: Secondary | ICD-10-CM | POA: Diagnosis not present

## 2021-06-25 DIAGNOSIS — K922 Gastrointestinal hemorrhage, unspecified: Secondary | ICD-10-CM | POA: Diagnosis not present

## 2021-06-25 LAB — RENAL FUNCTION PANEL
Albumin: 1.7 g/dL — ABNORMAL LOW (ref 3.5–5.0)
Anion gap: 9 (ref 5–15)
BUN: 36 mg/dL — ABNORMAL HIGH (ref 8–23)
CO2: 27 mmol/L (ref 22–32)
Calcium: 7.9 mg/dL — ABNORMAL LOW (ref 8.9–10.3)
Chloride: 92 mmol/L — ABNORMAL LOW (ref 98–111)
Creatinine, Ser: 3.97 mg/dL — ABNORMAL HIGH (ref 0.44–1.00)
GFR, Estimated: 11 mL/min — ABNORMAL LOW (ref >60)
Glucose, Bld: 99 mg/dL (ref 70–99)
Phosphorus: 3.7 mg/dL (ref 2.5–4.6)
Potassium: 5.7 mmol/L — ABNORMAL HIGH (ref 3.5–5.1)
Sodium: 128 mmol/L — ABNORMAL LOW (ref 135–145)

## 2021-06-25 LAB — GLUCOSE, CAPILLARY
Glucose-Capillary: 102 mg/dL — ABNORMAL HIGH (ref 70–99)
Glucose-Capillary: 128 mg/dL — ABNORMAL HIGH (ref 70–99)
Glucose-Capillary: 184 mg/dL — ABNORMAL HIGH (ref 70–99)
Glucose-Capillary: 88 mg/dL (ref 70–99)
Glucose-Capillary: 89 mg/dL (ref 70–99)
Glucose-Capillary: 95 mg/dL (ref 70–99)

## 2021-06-25 MED ORDER — FENTANYL CITRATE PF 50 MCG/ML IJ SOSY
PREFILLED_SYRINGE | INTRAMUSCULAR | Status: AC
  Administered 2021-06-25: 25 ug
  Filled 2021-06-25: qty 1

## 2021-06-25 NOTE — Progress Notes (Addendum)
Patient completed dialysis on HD chair/recliner.

## 2021-06-25 NOTE — Progress Notes (Signed)
[facility] PROGRESS NOTE  TANAYA DUNIGAN OYD:[id number] DOB: 09-26-1946 DOA: 05/19/2021 PCP: [name], MD  Status:Inpatient  Remains inpatient appropriate because:Unsafe d/c plan, IV treatments appropriate due to intensity of illness or inability to take PO, and Inpatient level of care appropriate due to severity of illness  Dispo: The patient is from: Home              Anticipated d/c is to: SNF              Patient currently is medically stable to d/c.   Difficult to place patient Yes              Barriers to discharge: Dialysis patient with markedly decreased mobility-we will need to prove tolerance of sitting in chair for hemodialysis while here as well as tolerating sitting in wheelchair for up to 1 hour x 2 to replicate transport process.  Cannot discharge and proceed with outpatient hemodialysis unless this is accomplished.      Level of care: Med-Surg  Code Status:  Family Communication:  DVT prophylaxis:  COVID vaccination status:    HPI: 75 y.o. 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  Patient presented to the ED on 7/25 with dark stool.  On presentation, hemoglobin was 9.6; GI was consulted.  She underwent EGD which was normal and flexible sigmoidoscopy which showed nonbleeding distal rectal ulcers/stercoral ulcers.  Subsequently, GI signed off.  Neurology was also consulted for altered mental status; MRI brain was similar to prior MRI at [facility] with a large right MCA stroke.   Nephrology has been following for dialysis needs; nephrology is stating that patient will not be accepted back to outpatient dialysis at this time due to her underlying  confusion, weakness and inability to sit in a dialysis recliner as an outpatient.   Due to her poor oral intake, family wanted a PEG tube placement.  IR tried but was unable to place because of overlying transverse colon.   General surgery was consulted for G-tube placement but family currently undecided.  She has an NG tube in place.   Palliative care following for goals of care discussion.  Subjective: Awake today.  Examined prior to hemodialysis.  Although confused verbally responded appropriately to questions although did answer orientation questions incorrectly.  She was able to stick out her tongue when asked.  Objective: Vitals:   06/24/21 2026 06/25/21 0452  BP: (!) 154/73 (!) 136/59  Pulse: 95 96  Resp: 17 19  Temp: 98 F (36.7 C) 97.6 F (36.4 C)  SpO2: 98% 99%    Intake/Output Summary (Last 24 hours) at 06/25/2021 0828 Last data filed at 06/25/2021 0731 Gross per 24 hour  Intake 1073.76 ml  Output 0 ml  Net 1073.76 ml   Filed Weights   06/16/21 1941 06/18/21 1610 06/18/21 1925  Weight: 62 kg 62 kg 60 kg    Exam:  Constitutional: NAD, calm, mildly uncomfortable after abdominal exam Respiratory: Anterior lung sounds are clear to auscultation, no increased work of breathing.  Pulse oximetry 95% on room air Cardiovascular: Regular rate and rhythm, no murmurs / rubs / gallops. No extremity edema.  Extremities warm to touch.  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 Bowel sounds positive. LBM 8/30 Neurologic: CN 2-12 grossly intact for deviation of eyes to right bilaterally although somewhat more to the midline today. Sensation intact, Strength 3/5 on the right.  With chronic left hemiparesis from prior CVA.  Continues to have left-sided neglect. Psychiatric: Awake and oriented to name only.  Able to follow simple commands today.   Assessment/Plan: Acute problems: Acute on chronic GI bleeding from stercoral ulcers -Patient  presented with anemia,  underwent EGD which was normal and flex sigmoidoscopy with clean base, nonbleeding distal rectal ulcers (stercoral ulcers). Gastroenterology now signed off on 05/22/21. -Hemoglobin remains stable between 7 and 8.  No active bleeding at this time. 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   EEG with cortical dysfunction right hemisphere likely secondary to underlying structural abnormality/stroke and moderate encephalopathy with no seizure activity or epileptiform discharges.   Lipid panel and hemoglobin A1c within normal limits.  Continue Lipitor Diet as per SLP recommendations.  Extremely poor to no oral intake Cortrak tube in place for enteral feedings Discussed with surgical team.  Need to determine if patient is actually a candidate for outpatient hemodialysis and they agree with current plan to begin dialyzing up in chair.  If she meets requirements for outpatient HD during the hospitalization they request we contact them again to discuss G-tube placement.  It is important to note that this will be an open G-tube placement and patient has previously been deemed high risk and this has previously been discussed with the patient's family.    ESRD on HD Per Nephro Patient is not a candidate for continued outpatient HD given her  confusion, weakness and inability to sit up in a chair for HD outpatient.  Nephrology suggested transition to hospice care. Palliative care met with family on 06/05/2021: Family wanted to continue to treat the treatable with hopes for improvement and to continue hemodialysis as long as patient can tolerate. As of 8/31 we are trying to see if patient could tolerate hemodialysis in a chair which is a requirement for outpatient hemodialysis -nephrology aware that patient needs to be up in chair for dialysis-nursing also aware that patient needs to be out of chair frequently for at least 1 hour to see if she could tolerate being up in a wheelchair for at least 1 hour either direction which is the expectation for Lucianne Lei transport   Hyperkalemia/hypophosphatemia Management otherwise per nephrology HD and supplementation as needed   Hypotension -Blood pressure currently is stable on midodrine.   Failure to thrive /Generalized deconditioning  /very poor oral intake PEG tube was desired by family but could not be placed because of overlying transverse colon.   Currently getting Cortrak feeding.  Patient's family insist on continuing Coretrak feeding.  However, patient cannot  be placed at any SNF with a core track in place.   General surgery previously declined to place feeding tube that patient is not a candidate for outpatient hemodialysis.  Will need to discuss with general surgery this week to clarify  Abdominal pain/?  Constipation No further abdominal pain and recent films without evidence of constipation Patient was on chronic narcotics prior to admission and these remain in place Patient was on 40 g Chronulac daily prn.  Continue scheduled Chronulac 20 g daily  Type 2 diabetes mellitus Episodes of hypoglycemia Hemoglobin A1c 5.1.  Diet controlled at baseline. Patient had episodes of hypoglycemia for which she required dextrose drip briefly.  Currently receiving tube feeding.    Chronic thrombus  left upper extremity Continue Eliquis Elevate as appropriate  Stage II mid coccyx pressure injury, present on admission Continue wound care    GERD Could also be contributing to patient's poor oral intake Increased frequency of Protonix to BID sitter adding Pepcid     Data Reviewed: Basic Metabolic Panel: Recent Labs  Lab 06/19/21 0700 06/20/21 0541 06/22/21 1742 06/23/21 0723 06/23/21 1036 06/23/21 1048  NA 133* 132* 133* 131*  --  133*  K 4.7 5.2* 6.6* 6.7* 3.1* 3.5  CL 95* 94* 97* 93*  --  93*  CO2 _0 32  --   --   GLUCOSE 189* 152* 94 99  --  113*  BUN 28* 37* 41* 47*  --  20  CREATININE 3.61* 3.87* 3.82* 4.08*  --  1.90*  CALCIUM 7.4* 7.5* 7.8* 7.9*  --   --   PHOS 1.9* 2.7 2.6  --   --   --    Liver Function Tests: Recent Labs  Lab 06/19/21 0700 06/20/21 0541 06/22/21 1742  ALBUMIN 1.8* 1.9* 1.8*   No results for input(s): LIPASE, AMYLASE in the last 168 hours. No results for input(s): AMMONIA in the last 168 hours. CBC: Recent Labs  Lab 06/22/21 1742 06/23/21 0723 06/23/21 1048  WBC 11.8*  --   --   HGB 6.8* 8.4* 10.2*  HCT 21.7* 26.0* 30.0*  MCV 95.6  --   --   PLT 245  --   --    Cardiac Enzymes: No results for input(s): CKTOTAL, CKMB, CKMBINDEX, TROPONINI in the last 168 hours. BNP (last 3 results) No results for input(s): BNP in the last 8760 hours.  ProBNP (last 3 results) No results for input(s): PROBNP in the last 8760 hours.  CBG: Recent Labs  Lab 06/24/21 1621 06/24/21 1950 06/25/21 0009 06/25/21 0354 06/25/21 0807  GLUCAP 78 100* 102* 89 88    No results found for this or any previous visit (from the past 240 hour(s)).   Studies: DG Abd Portable 1V  Result Date: 06/23/2021 CLINICAL DATA:  Check feeding catheter placement EXAM: PORTABLE ABDOMEN - 1 VIEW COMPARISON:  06/13/2021 FINDINGS: Feeding catheter has withdrawn slightly from the prior exam but remains in the midportion of the stomach. No obstructive changes  are seen. IMPRESSION: Feeding catheter within the mid stomach. Electronically Signed   By: Inez Catalina M.D.   On: 06/23/2021 14:44    Scheduled Meds:  apixaban  2.5 mg Per Tube BID   atorvastatin  40 mg Per Tube Daily   collagenase   Topical Daily   darbepoetin (ARANESP) injection - DIALYSIS  150 mcg Intravenous Q Mon-HD   feeding supplement (PROSource TF)  45 mL Per Tube Daily   gabapentin  100 mg Per Tube  QHS   insulin aspart  0-6 Units Subcutaneous Q4H   lactulose  20 g Oral Daily   midodrine  10 mg Per Tube TID WC   mirtazapine  7.5 mg Per Tube QHS   multivitamin  1 tablet Per Tube QHS   pantoprazole sodium  40 mg Per Tube Daily   sodium chloride flush  3 mL Intravenous Q12H   thiamine  100 mg Per Tube Daily   venlafaxine  37.5 mg Per Tube BID   Continuous Infusions:  sodium chloride     dextrose 50 mL/hr at 06/25/21 0609   feeding supplement (OSMOLITE 1.5 CAL) Stopped (06/24/21 2136)   iron sucrose      Principal Problem:   GI bleed Active Problems:   Essential hypertension   Type 2 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, with long-term current use of insulin ([place])   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   ESRD on dialysis ([place])   Dyslipidemia   Hypokalemia   Stage 2 skin ulcer of sacral region [facility])   Cerebral thrombosis with cerebral infarction   Protein-calorie malnutrition, severe   Acute GI bleeding   Oropharyngeal dysphagia   Failure to thrive in adult   Inadequate oral intake   Consultants: Vascular surgery Nephrology Gastroenterology Neurology Palliative medicine General surgery  Procedures: EEG Echocardiogram Cortrack EGD  Antibiotics: Ceftriaxone 8/3 through 8/5   Time spent: 45 minutes    Erin Hearing ANP  [facility] 7 am - 330 pm/M-F for direct patient care and secure chat Please refer to Amion for contact info 36  days

## 2021-06-25 NOTE — Progress Notes (Signed)
[facility] Progress Note   Subjective: Seen in room, staff hoyer lifting to chair, yelling, "ow,ow, ow".  Not responding to verbal stimuli, not following commands  Objective Vitals:   06/24/21 1620 06/24/21 2026 06/25/21 0452 06/25/21 1132  BP: 138/77 (!) 154/73 (!) 136/59 (!) 144/59  Pulse: 93 95 96 (!) 108  Resp: 16 17 19 16   Temp: 98.6 F (37 C) 98 F (36.7 C) 97.6 F (36.4 C) 97.9 F (36.6 C)  TempSrc:  Oral Oral   SpO2: 97% 98% 99% 100%  Weight:      Height:       Physical Exam General: Chronically ill appearing female upset because she is being hoyered to chair Heart: S1,S2 RRR Lungs: CTAB on RA. No WOB.  Abdomen: NABS. Cortek feeding tube in place Extremities: Edema UE, L arm contracture distal to AVG No LE Edema Dialysis Access: L AVG + T/B    Additional Objective Labs: Basic Metabolic Panel: Recent Labs  Lab 06/19/21 0700 06/20/21 0541 06/22/21 1742 06/23/21 0723 06/23/21 1036 06/23/21 1048  NA 133* 132* 133* 131*  --  133*  K 4.7 5.2* 6.6* 6.7* 3.1* 3.5  CL 95* 94* 97* 93*  --  93*  CO2 31 30 28  32  --   --   GLUCOSE 189* 152* 94 99  --  113*  BUN 28* 37* 41* 47*  --  20  CREATININE 3.61* 3.87* 3.82* 4.08*  --  1.90*  CALCIUM 7.4* 7.5* 7.8* 7.9*  --   --   PHOS 1.9* 2.7 2.6  --   --   --    Liver Function Tests: Recent Labs  Lab 06/19/21 0700 06/20/21 0541 06/22/21 1742  ALBUMIN 1.8* 1.9* 1.8*   No results for input(s): LIPASE, AMYLASE in the last 168 hours. CBC: Recent Labs  Lab 06/22/21 1742 06/23/21 0723 06/23/21 1048  WBC 11.8*  --   --   HGB 6.8* 8.4* 10.2*  HCT 21.7* 26.0* 30.0*  MCV 95.6  --   --   PLT 245  --   --    Blood Culture    Component Value Date/Time   SDES URINE, CATHETERIZED 05/02/2021 0022   SPECREQUEST NONE 05/02/2021 0022   CULT  05/02/2021 0022    NO GROWTH Performed at [facility], Between [address], [place], [place] [address]    REPTSTATUS 05/04/2021 FINAL 05/02/2021 0022     Cardiac Enzymes: No results for input(s): CKTOTAL, CKMB, CKMBINDEX, TROPONINI in the last 168 hours. CBG: Recent Labs  Lab 06/24/21 1950 06/25/21 0009 06/25/21 0354 06/25/21 0807 06/25/21 1136  GLUCAP 100* 102* 89 88 95   Iron Studies: No results for input(s): IRON, TIBC, TRANSFERRIN, FERRITIN in the last 72 hours. @lablastinr3 @ Studies/Results: DG Abd Portable 1V  Result Date: 06/23/2021 CLINICAL DATA:  Check feeding catheter placement EXAM: PORTABLE ABDOMEN - 1 VIEW COMPARISON:  06/13/2021 FINDINGS: Feeding catheter has withdrawn slightly from the prior exam but remains in the midportion of the stomach. No obstructive changes are seen. IMPRESSION: Feeding catheter within the mid stomach. Electronically Signed   By: Inez Catalina M.D.   On: 06/23/2021 14:44   Medications:  sodium chloride     dextrose 50 mL/hr at 06/25/21 0609   feeding supplement (OSMOLITE 1.5 CAL) Stopped (06/24/21 2136)   iron sucrose      apixaban  2.5 mg Per Tube BID   atorvastatin  40 mg Per Tube Daily   collagenase   Topical Daily  darbepoetin (ARANESP) injection - DIALYSIS  150 mcg Intravenous Q Mon-HD   feeding supplement (PROSource TF)  45 mL Per Tube Daily   gabapentin  100 mg Per Tube QHS   insulin aspart  0-6 Units Subcutaneous Q4H   lactulose  20 g Oral Daily   midodrine  10 mg Per Tube TID WC   mirtazapine  7.5 mg Per Tube QHS   multivitamin  1 tablet Per Tube QHS   pantoprazole sodium  40 mg Per Tube Daily   sodium chloride flush  3 mL Intravenous Q12H   thiamine  100 mg Per Tube Daily   venlafaxine  37.5 mg Per Tube BID     OP Dialysis Orders: AF MWF  3h 76mn 400/500    62.5kg    3K/2.5 Ca    P2  AVG HeRO   Hep none - Mircera 50 q 2 wks  (7/13)  Venofer 50 q wk - Parsabiv 511mIV q HDlower extremity edema, left upper arm swelling, nontender dialysis Access: Left AVG    Assessment/Plan AMS/FTT: Providers and palliative care met w/ family and discussed pt's decline. Due to  declining mental and physical status we don't anticipate she will be able to do OP dialysis.  Pt will have to be able to sit in a chair for 4-6 hrs to qualify for OP HD w/ our group. If she is able to sit for dialysis and cooperate, then the next step would to re-CLIP the patient as her OP HD unit will not take her back.  Tolerated dialysis x1 in chair last week. HD in chair again today    2.   Melena/Rectal bleeding - GI consulted. Hx rectal ulcers.  EGD 7/27 normal, flex sig with mucosal ulceration c/w sterocoral ulcers, non bleeding.  Last hgb 10.2=8/29 stable ESRD: Now on MWF schedule. Last HD 08/29. HD today.  Prior issues with cannulation on 8/20 , [facility], but no issues since using expert [facility]ulater [facility].  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  mild Increased volume noted on exam, getting under dry weight, will need to lower on d/c.  Attempt UF 2L  HD  Nutrition: Albumin 2.1 > 1.8 continue protein supps.  TF per RD once Cortrak in place.  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  Anemia  - last Hgb 10.2 Aranesp 150 mcg given 08/29 Tsat 27%, will start iron load. X3    with ferritin 1070.  Dispo: DNR/DNI. Poor prognosis. Palliative care has seen see #1 Appreciate their involvement. We do not belive dialysis is adding to her quality of life. Per family communication  family wishes to continue dialysis   Syon Tews H. Denitra Donaghey NP-C 06/25/2021, 12:23 PM  [phone]

## 2021-06-25 NOTE — Consult Note (Signed)
Consultation Note Date: 06/25/2021   Patient Name: Amy [name]  DOB: 12-07-1945  MRN: [id number]  Age / Sex: 75 y.o., female  PCP: [name], MD Referring Physician: Terrilee Croak, MD  Reason for Consultation: Establishing goals of care and Psychosocial/spiritual support  HPI/Patient Profile: 75 y.o. 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   03/26/21 Flex sig, at [facility]: multiple rectal ulcers.  Path: Polypoid fragment of colorectal mucosa with crypt architectural disorder including slight hyperplastic features, mild fibromuscular hyperplasia of the lamina propria, and adherent necroinflammatory debris suggestive of ulcer exudate. 05/21/21 Flex sig: clean based, non-bleeding distal rectl ulcers (stercoral ulcer).  Blood clots and solid stool limited visualization.   05/21/21 EGD: Normal.     Patient is confused, unable to follow commands with poor p.o. intake  Today is day 37 of this hospital stay.  She continues to fail to thrive.  Family face treatment options decisions, advanced directive decisions and anticipatory care needs.  Clinical Assessment and Goals of Care:  This NP Wadie Lessen reviewed medical records, received report from team, assessed the patient and then planned  to meet with son at bedside,( he could not keep appointment 2/2 work conflict)  so we spoke by phone  to discuss diagnosis, prognosis, GOC, EOL wishes disposition and options. Values and goals of care important to patient and family were attempted to be elicited.  I have worked with this family early on in this hospital stay and re-engaged at attending's request.    Concept of Palliative Care was re-introduced as specialized medical care for people and their families  living with serious illness.  If focuses on providing relief from the symptoms and stress of a serious illness.  The goal is to improve quality of life for both the patient and the family.  A  discussion was had today regarding advanced directives.  Concepts specific to code status, artifical feeding and hydration, continued IV antibiotics and rehospitalization was had.     Education offered on concept specific to adult failure to thrive and the limitations of medical interventions to prolong quality of life when the body fails to thrive. The difference between a aggressive medical intervention path  and a palliative comfort care path for this patient at this time was had.    Education on hospice philosophy and benefit offered.  Created space and opportunity for son to explore thoughts and feelings regarding current medical situation.    [name] tells me that he understands the seriousness of his mother's current medical situation however at this point he continues to see intermittent improvement and that gives him hope that with continued medical interventions she will improve.        Questions and concerns addressed.  Patient  encouraged to call with questions or concerns.     PMT will continue to support holistically.  [name] Dishon/son is her next of kin and main decision maker      SUMMARY OF RECOMMENDATIONS    Code Status/Advance Care Planning: DNR   Symptom Management:  Per attending   Palliative Prophylaxis:  Aspiration, Bowel Regimen, Delirium Protocol, Frequent Pain Assessment, and Oral Care  Additional Recommendations (Limitations, Scope, Preferences): Full Scope Treatment  Psycho-social/Spiritual:  Desire for further Chaplaincy support:no Additional Recommendations: Education on Hospice  Prognosis:  Unable to determine- long term poor prognosis  Discharge Planning: To Be Determined      Primary Diagnoses: Present on Admission:  Dyslipidemia   Essential hypertension  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  (Resolved) Anaphylactic shock, unspecified, initial encounter  Hypokalemia  GI bleed  Stage 2 skin ulcer of sacral region [facility])   I have reviewed the medical record, interviewed the patient and family, and examined the patient. The following aspects are pertinent.  Past Medical History:  Diagnosis Date   Chronic kidney disease    dialysis M-W-F - dialysis cath located in right side of chest   Diabetes mellitus without complication (HCC)    type 2 - no meds    HLD (hyperlipidemia)    diet controlled   Hypertension    no meds   Social History   Socioeconomic History   Marital status: Single    Spouse name: Not on file   Number of children: Not on file   Years of education: Not on file   Highest education level: Not on file  Occupational History   Not on file  Tobacco Use   Smoking status: Former   Smokeless tobacco: Never  Vaping Use   Vaping Use: Never used  Substance and Sexual Activity   Alcohol use: Yes    Comment: occasional   Drug use: Not Currently    Types: Marijuana    Comment: Last use 2020   Sexual activity: Not on file    Comment: Tubal  Other Topics Concern   Not on file  Social History Narrative   Not on file   Social Determinants of Health   Financial Resource Strain: Not on file  Food Insecurity: Not on file  Transportation Needs: Not on file  Physical Activity: Not on file  Stress: Not on file  Social Connections: Not on file   Family History  Problem Relation Age of Onset   Breast cancer Neg Hx    Scheduled Meds:  apixaban  2.5 mg Per Tube BID   atorvastatin  40 mg Per Tube Daily   collagenase   Topical Daily   darbepoetin (ARANESP) injection - DIALYSIS  150 mcg Intravenous Q Mon-HD   feeding supplement (PROSource TF)  45 mL Per Tube Daily   gabapentin  100 mg Per Tube QHS   insulin aspart  0-6 Units Subcutaneous Q4H   lactulose  20 g Oral Daily    midodrine  10 mg Per Tube TID WC   mirtazapine  7.5 mg Per Tube QHS   multivitamin  1 tablet Per Tube QHS   pantoprazole sodium  40 mg Per Tube Daily   sodium chloride flush  3 mL Intravenous Q12H   thiamine  100 mg Per Tube Daily   venlafaxine  37.5 mg Per Tube BID   Continuous Infusions:  sodium chloride     dextrose 50 mL/hr at 06/25/21 0609   feeding supplement (OSMOLITE 1.5 CAL) Stopped (06/24/21 2136)   iron sucrose     PRN Meds:.sodium chloride, acetaminophen, fentaNYL, midazolam, ondansetron (  ZOFRAN) IV, oxyCODONE, sodium chloride flush Medications Prior to Admission:  Prior to Admission medications   Medication Sig Start Date End Date Taking? Authorizing Provider  acetaminophen (TYLENOL) 500 MG tablet Take 1,000 mg by mouth every 6 (six) hours as needed for mild pain.   Yes [provider]  apixaban (ELIQUIS) 5 MG TABS tablet Take 5 mg by mouth 2 (two) times daily.   Yes [provider]  atorvastatin (LIPITOR) 40 MG tablet Take 40 mg by mouth daily.   Yes [provider]  dibucaine (NUPERCAINAL) 1 % OINT Place 1 application rectally every 2 (two) hours as needed (pain).   Yes [provider]  diclofenac Sodium (VOLTAREN) 1 % GEL Apply 2 g topically 4 (four) times daily.   Yes [provider]  FLUoxetine (PROZAC) 40 MG capsule Take 40 mg by mouth daily.   Yes [provider]  gabapentin (NEURONTIN) 100 MG capsule Take 100 mg by mouth at bedtime.   Yes [provider]  hydrocortisone 1 % ointment Apply 1 application topically 2 (two) times daily. Left side of neck   Yes [provider]  lactulose (CHRONULAC) 10 GM/15ML solution Take 60 mLs (40 g total) by mouth daily as needed for mild constipation. Patient taking differently: Take 27 g by mouth daily as needed for mild constipation. 03/11/21  Yes [name], MD  lidocaine (LIDODERM) 5 % Place 1 patch onto the skin See admin instructions. Apply to  right neck in the morning and remove at bedtime   Yes [provider]  lidocaine-prilocaine (EMLA) cream Apply 1 application topically every Monday, Wednesday, and Friday. 1-2  Hours prior to dialysis 01/17/21  Yes [provider]  midodrine (PROAMATINE) 10 MG tablet Take 10 mg by mouth in the morning and at bedtime.   Yes [provider]  mirtazapine (REMERON) 7.5 MG tablet Take 7.5 mg by mouth at bedtime.   Yes [provider]  multivitamin (RENA-VIT) TABS tablet Take 1 tablet by mouth daily.   Yes [provider]  Nutritional Supplements (ENSURE CLEAR PO) Take 237 mLs by mouth in the morning, at noon, and at bedtime.   Yes [provider]  nystatin cream (MYCOSTATIN) Apply 1 application topically See admin instructions. Apply to groin as needed x 14 days for yeast   Yes [provider]  ondansetron (ZOFRAN) 4 MG tablet Take 4 mg by mouth every 6 (six) hours as needed for nausea or vomiting.   Yes [provider]  oxyCODONE (OXY IR/ROXICODONE) 5 MG immediate release tablet Take 5 mg by mouth every 12 (twelve) hours.   Yes [provider]  pantoprazole (PROTONIX) 20 MG tablet Take 1 tablet (20 mg total) by mouth daily. 03/11/21  Yes [name], MD  venlafaxine XR (EFFEXOR-XR) 75 MG 24 hr capsule Take 75 mg by mouth daily. 02/03/21  Yes [provider]   Allergies  Allergen Reactions   Sensipar [Cinacalcet]     Per MAR   Statins     Per MAR   Review of Systems  Unable to perform ROS: Mental status change   Physical Exam Constitutional:      Appearance: She is ill-appearing.  Cardiovascular:     Rate and Rhythm: Normal rate.  Pulmonary:     Effort: Pulmonary effort is normal.  Skin:    General: Skin is warm and dry.  Neurological:     Mental Status: She is confused.  Psychiatric:  Cognition and Memory: Cognition is impaired.    Vital Signs: BP (!) 136/59 (BP Location: Left Leg)    Pulse 96   Temp 97.6 F (36.4 C) (Oral)   Resp 19   Ht '5\' 7"'$  (1.702 m)   Wt 60 kg   LMP  (LMP Unknown)   SpO2 99%   BMI 20.72 kg/m  Pain Scale: Faces POSS *See Group Information*: 1-Acceptable,Awake and alert Pain Score: 0-No pain   SpO2: SpO2: 99 % O2 Device:SpO2: 99 % O2 Flow Rate: .O2 Flow Rate (L/min): 1 L/min  IO: Intake/output summary:  Intake/Output Summary (Last 24 hours) at 06/25/2021 [id number] Last data filed at 06/25/2021 [id number] Gross per 24 hour  Intake 1073.76 ml  Output 0 ml  Net 1073.76 ml    LBM: Last BM Date: 06/24/21 Baseline Weight: Weight:  (uta- on ED stretcher) Most recent weight: Weight:  (patient in chair)     Palliative Assessment/Data:  30 % at best   Discussed with Dr Pietro Cassis and treatment team  Time In:  1445 Time Out: 1545 Time Total: 60 minutes Greater than 50%  of this time was spent counseling and coordinating care related to the above assessment and plan.  Signed by: Wadie Lessen, NP   Please contact Palliative Medicine Team phone at [phone] for questions and concerns.  For individual provider: See Shea Evans

## 2021-06-25 NOTE — Procedures (Signed)
Cortrak  Person Inserting Tube:  Hattie Aguinaldo, Creola Corn, RD Tube Type:  Cortrak - 43 inches Tube Size:  10 Tube Location:  Left nare Initial Placement:  Stomach Secured by: Bridle Technique Used to Measure Tube Placement:  Marking at nare/corner of mouth Cortrak Secured At:  66 cm Cortrak Tube Team Note:  Consult received to place a Cortrak feeding tube.   X-ray is required, abdominal x-ray has been ordered by the Cortrak team. Please confirm tube placement before using the Cortrak tube.   If the tube becomes dislodged please keep the tube and contact the Cortrak team at [email or web address] (password [email or web address]) for replacement.  If after hours and replacement cannot be delayed, place a NG tube and confirm placement with an abdominal x-ray.    Larkin Ina, MS, RD, LDN (she/her/hers) RD pager number and weekend/on-call pager number located in [place].

## 2021-06-26 DIAGNOSIS — R5081 Fever presenting with conditions classified elsewhere: Secondary | ICD-10-CM

## 2021-06-26 DIAGNOSIS — R509 Fever, unspecified: Secondary | ICD-10-CM | POA: Insufficient documentation

## 2021-06-26 DIAGNOSIS — K922 Gastrointestinal hemorrhage, unspecified: Secondary | ICD-10-CM | POA: Diagnosis not present

## 2021-06-26 DIAGNOSIS — D638 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: Secondary | ICD-10-CM | POA: Diagnosis not present

## 2021-06-26 DIAGNOSIS — N186 End stage renal disease: Secondary | ICD-10-CM | POA: Diagnosis not present

## 2021-06-26 DIAGNOSIS — R627 Adult failure to thrive: Secondary | ICD-10-CM | POA: Diagnosis not present

## 2021-06-26 LAB — GLUCOSE, CAPILLARY
Glucose-Capillary: 102 mg/dL — ABNORMAL HIGH (ref 70–99)
Glucose-Capillary: 114 mg/dL — ABNORMAL HIGH (ref 70–99)
Glucose-Capillary: 151 mg/dL — ABNORMAL HIGH (ref 70–99)
Glucose-Capillary: 164 mg/dL — ABNORMAL HIGH (ref 70–99)
Glucose-Capillary: 180 mg/dL — ABNORMAL HIGH (ref 70–99)
Glucose-Capillary: 199 mg/dL — ABNORMAL HIGH (ref 70–99)

## 2021-06-26 MED ORDER — WHITE PETROLATUM EX OINT
TOPICAL_OINTMENT | CUTANEOUS | Status: AC
  Filled 2021-06-26: qty 28.35

## 2021-06-26 MED ORDER — CHLORHEXIDINE GLUCONATE CLOTH 2 % EX PADS
6.0000 | MEDICATED_PAD | Freq: Every day | CUTANEOUS | Status: DC
  Administered 2021-06-26: 6 via TOPICAL

## 2021-06-26 NOTE — Progress Notes (Signed)
Pt had previous Yellow MEWS score. Remain stable. No changes from previous assessment. MD aware. Will continue to monitor pt.  06/26/21 0839  Assess: MEWS Score  Temp 99.1 F (37.3 C)  BP 121/66  Pulse Rate (!) 113  Resp 17  SpO2 99 %  O2 Device Room Air  Assess: MEWS Score  MEWS Temp 0  MEWS Systolic 0  MEWS Pulse 2  MEWS RR 0  MEWS LOC 0  MEWS Score 2  MEWS Score Color Yellow  Assess: if the MEWS score is Yellow or Red  Were vital signs taken at a resting state? Yes  Focused Assessment No change from prior assessment  Early Detection of Sepsis Score *See Row Information* Low  MEWS guidelines implemented *See Row Information* No, previously yellow, continue vital signs every 4 hours  Document  Patient Outcome Not stable and remains on department  Progress note created (see row info) Yes

## 2021-06-26 NOTE — Progress Notes (Signed)
[facility] PROGRESS NOTE  Amy [name] [id number] DOB: 1946/03/24 DOA: 05/19/2021 PCP: [name], MD  Status:Inpatient  Remains inpatient appropriate because:Unsafe d/c plan, IV treatments appropriate due to intensity of illness or inability to take PO, and Inpatient level of care appropriate due to severity of illness  Dispo: The patient is from: Home              Anticipated d/c is to: SNF              Patient currently is medically stable to d/c.   Difficult to place patient Yes              Barriers to discharge: Dialysis patient with markedly decreased mobility-we will need to prove tolerance of sitting in chair for hemodialysis while here as well as tolerating sitting in wheelchair for up to 1 hour x 2 to replicate transport process.  Cannot discharge and proceed with outpatient hemodialysis unless this is accomplished.      Level of care: Med-Surg  Code Status:  Family Communication:  DVT prophylaxis:  COVID vaccination status:    HPI: 75 y.o. 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  Patient presented to the ED on 7/25 with dark stool.  On presentation, hemoglobin was 9.6; GI was consulted.  She underwent EGD which was normal and flexible sigmoidoscopy which showed nonbleeding distal rectal ulcers/stercoral ulcers.  Subsequently, GI signed off.  Neurology was also consulted for altered mental status; MRI brain was similar to prior MRI at [facility] with a large right MCA stroke.   Nephrology has been following for dialysis needs; nephrology is stating that patient will not be accepted back to outpatient dialysis at this time due to her underlying  confusion, weakness and inability to sit in a dialysis recliner as an outpatient.   Due to her poor oral intake, family wanted a PEG tube placement.  IR tried but was unable to place because of overlying transverse colon.   General surgery was consulted for G-tube placement but family currently undecided.  She has an NG tube in place.   Palliative care following for goals of care discussion.  Subjective: Patient alert and quite talkative today.  She remains confused but was able to participate in more appropriate conversation today.  Is me she is too tired to eat.  Objective: Vitals:   06/25/21 2036 06/26/21 0459  BP: (!) 128/45 (!) 117/52  Pulse: (!) 109 (!) 118  Resp: 19 20  Temp: 98.5 F (36.9 C) 99.2 F (37.3 C)  SpO2: 97% 100%    Intake/Output Summary (Last 24 hours) at 06/26/2021 0821 Last data filed at 06/25/2021 1710 Gross per 24 hour  Intake 0 ml  Output 1849 ml  Net -1849 ml   Filed Weights   06/18/21 1610 06/18/21 1925  Weight: 62 kg 60 kg    Exam:  Constitutional: NAD, calm,  Respiratory: Anterior lung sounds are clear to auscultation, no tachypnea.  No cough.  Pulse oximetry 99% Cardiovascular: Regular pulse, sounds.  No extremity edema.  Extremities warm to touch.  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 Bowel sounds positive. LBM 9/01 Neurologic: CN 2-12 grossly intact -today eyes are midline  and she is able to track during conversation. Sensation intact, Strength 3/5 on the right.  With chronic left hemiparesis from prior CVA.  Continues to have left-sided neglect. Psychiatric: Awake and oriented to name and place.  More appropriate with conversational speech today despite being confused.   Assessment/Plan: Acute problems: Acute on chronic GI bleeding from stercoral ulcers -Patient presented with anemia,  underwent EGD which was normal and flex sigmoidoscopy with clean base, nonbleeding distal rectal ulcers  (stercoral ulcers). Gastroenterology now signed off on 05/22/21. -Hemoglobin remains stable between 7 and 8.  No active bleeding at this time. 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   EEG with cortical dysfunction right hemisphere likely secondary to underlying structural abnormality/stroke and moderate encephalopathy with no seizure activity or epileptiform discharges.   Lipid panel and hemoglobin A1c within normal limits.  Continue Lipitor Diet as per SLP recommendations.  Extremely poor to no oral intake Cortrak tube in place for enteral feedings Discussed with surgical team.  Need to determine if patient is actually a candidate for outpatient hemodialysis and they agree with current plan to begin dialyzing up in chair.  If she meets requirements for outpatient HD during the hospitalization they request we contact them again to discuss G-tube placement.  It is important to note that this will be an open G-tube placement and patient has previously been deemed high risk and this has previously been discussed with the patient's family.    ESRD on HD Per Nephro Patient is not a candidate for continued outpatient HD given her confusion, weakness and inability to sit up in a chair for HD outpatient.  Nephrology suggested transition to hospice care but  family resistant.  Palliative care attempted to meet in person with son on 8/31 but he was a no-show.  She notified him by telephone and he insisted that as long as his mother could speak that she would remain full aggressive care.. As of 8/31 we are trying to see if patient could tolerate hemodialysis in a chair which is a requirement for outpatient hemodialysis -Unfortunately patient cried the entire session and repeatedly stated "help me, help me, help me".  Nephrology suggested having family come to bedside and witnessed these behaviors for themselves.  Palliative medicine team will further explore.  Low-grade fever.   Beginning at 4 AM patient had an oral temperature of 99.2 and subsequently increased w/ axillary temperature 99.1 at 9 AM Lung sounds are clear and pulse oximetry is 99% so at this juncture do not suspect pulmonary infection Patient is anuric but as a precaution we will check urinalysis and culture-catheterized specimen High risk for bacteremia given hemodialysis so we will check blood culture If above unremarkable may need to rule out COVID.  We will continue to monitor Patient  also has stage II coccyx decubitus and this will need to be examined if fevers persist.  Hyperkalemia/hypophosphatemia Management per nephrology HD -supplementation as needed   Hypotension Blood pressure currently is stable on midodrine.   Failure to thrive /Generalized deconditioning  /very poor oral intake PEG tube was desired by family but could not be placed because of overlying transverse colon.   Continue Cortrak feeding.    General surgery previously declined to place feeding tube given that patient is not a candidate for outpatient hemodialysis-discussed with general surgery on 8/31 and if patient can tolerate dialysis in chair and plan is to discuss possible open G-tube placed  Abdominal pain/?  Constipation No further abdominal pain and recent films without evidence of constipation Patient  was on chronic narcotics prior to admission and these remain in place Patient was on 40 g Chronulac daily prn.  Continue scheduled Chronulac 20 g daily  Type 2 diabetes mellitus Episodes of hypoglycemia Hemoglobin A1c 5.1.  Diet controlled at baseline. Patient had episodes of hypoglycemia for which she required dextrose drip briefly.  Currently receiving tube feeding.    Chronic thrombus left upper extremity Continue Eliquis Elevate as appropriate  Stage II mid coccyx pressure injury, present on admission Continue wound care    GERD Could also be contributing to patient's poor oral intake Increased frequency of Protonix to BID sitter adding Pepcid     Data Reviewed: Basic Metabolic Panel: Recent Labs  Lab 06/20/21 0541 06/22/21 1742 06/23/21 0723 06/23/21 1036 06/23/21 1048 06/25/21 1241  NA 132* 133* 131*  --  133* 128*  K 5.2* 6.6* 6.7* 3.1* 3.5 5.7*  CL 94* 97* 93*  --  93* 92*  CO2 30 28 32  --   --  27  GLUCOSE 152* 94 99  --  113* 99  BUN 37* 41* 47*  --  20 36*  CREATININE 3.87* 3.82* 4.08*  --  1.90* 3.97*  CALCIUM 7.5* 7.8* 7.9*  --   --  7.9*  PHOS 2.7 2.6  --   --   --  3.7   Liver Function Tests: Recent Labs  Lab 06/20/21 0541 06/22/21 1742 06/25/21 1241  ALBUMIN 1.9* 1.8* 1.7*   No results for input(s): LIPASE, AMYLASE in the last 168 hours. No results for input(s): AMMONIA in the last 168 hours. CBC: Recent Labs  Lab 06/22/21 1742 06/23/21 0723 06/23/21 1048  WBC 11.8*  --   --   HGB 6.8* 8.4* 10.2*  HCT 21.7* 26.0* 30.0*  MCV 95.6  --   --   PLT 245  --   --    Cardiac Enzymes: No results for input(s): CKTOTAL, CKMB, CKMBINDEX, TROPONINI in the last 168 hours. BNP (last 3 results) No results for input(s): BNP in the last 8760 hours.  ProBNP (last 3 results) No results for input(s): PROBNP in the last 8760 hours.  CBG: Recent Labs  Lab 06/25/21 1136 06/25/21 1931 06/25/21 2351 06/26/21 0339 06/26/21 0802  GLUCAP 95 128*  184* 199* 180*    No results found for this or any previous visit (from the past 240 hour(s)).   Studies: DG Abd Portable 1V  Result Date: 06/25/2021 CLINICAL DATA:  Feeding tube placement. EXAM: PORTABLE ABDOMEN - 1 VIEW COMPARISON:  Radiographs 06/23/2021 and 06/13/2021. FINDINGS: 1151 hours. Tip of the feeding tube projects over the right upper quadrant of the abdomen, consistent with location in the distal stomach or proximal duodenum. The visualized bowel gas pattern is nonobstructive. Convex  left thoracolumbar scoliosis again noted. IMPRESSION: Feeding tube projects to the level of the distal stomach or proximal duodenum. Electronically Signed   By: Richardean Sale M.D.   On: 06/25/2021 12:34    Scheduled Meds:  apixaban  2.5 mg Per Tube BID   atorvastatin  40 mg Per Tube Daily   collagenase   Topical Daily   darbepoetin (ARANESP) injection - DIALYSIS  150 mcg Intravenous Q Mon-HD   feeding supplement (PROSource TF)  45 mL Per Tube Daily   gabapentin  100 mg Per Tube QHS   insulin aspart  0-6 Units Subcutaneous Q4H   lactulose  20 g Oral Daily   midodrine  10 mg Per Tube TID WC   mirtazapine  7.5 mg Per Tube QHS   multivitamin  1 tablet Per Tube QHS   pantoprazole sodium  40 mg Per Tube Daily   sodium chloride flush  3 mL Intravenous Q12H   thiamine  100 mg Per Tube Daily   venlafaxine  37.5 mg Per Tube BID   Continuous Infusions:  sodium chloride     feeding supplement (OSMOLITE 1.5 CAL) 1,000 mL (06/25/21 1936)   iron sucrose 100 mg (06/25/21 1610)    Principal Problem:   GI bleed Active Problems:   Essential hypertension   Type 2 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, with long-term current use of insulin (HCC)   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   ESRD on dialysis ([place])   Dyslipidemia   Hypokalemia   Stage 2 skin ulcer of sacral region ([place])   Cerebral thrombosis with cerebral infarction   Protein-calorie  malnutrition, severe   Acute GI bleeding   Oropharyngeal dysphagia   Failure to thrive in adult   Inadequate oral intake   Clogged feeding tube   Consultants: Vascular surgery Nephrology Gastroenterology Neurology Palliative medicine General surgery  Procedures: EEG Echocardiogram Cortrack EGD  Antibiotics: Ceftriaxone 8/3 through 8/5   Time spent: 45 minutes    Erin Hearing ANP  [facility] 7 am - 330 pm/M-F for direct patient care and secure chat Please refer to Amion for contact info 37  days

## 2021-06-26 NOTE — Progress Notes (Signed)
Pt was moved from bed to chair with a lift from 9:30am to 3pm. Pain medication was given after she came back to bed. No further complains noted.

## 2021-06-26 NOTE — Progress Notes (Addendum)
[facility] Progress Note   Subjective: HD yesterday in chair. Cried, "help me, help me, Please help me" the entire treatment. RN gave pain medication but this did not help patient, was unable to console pt. Would suggest that perhaps family members being present during HD might be helpful to possibly console her and also see that these treatments are causing her distress.   Currently up in chair, eyes deviated to R. Saying "Pick me up, Pick me Up". Not engaging in conversation.   Objective Vitals:   06/25/21 1710 06/25/21 2036 06/26/21 0459 06/26/21 0839  BP: (!) 134/92 (!) 128/45 (!) 117/52 121/66  Pulse: (!) 101 (!) 109 (!) 118 (!) 113  Resp: _0 Temp: 97.9 F (36.6 C) 98.5 F (36.9 C) 99.2 F (37.3 C) 99.1 F (37.3 C)  TempSrc:  Oral Oral Axillary  SpO2: 99% 97% 100% 99%  Weight:      Height:       Physical Exam General: Chronically ill appearing female  Heart: S1,S2 RRR Lungs: CTAB on RA. No WOB.  Abdomen: NABS. Cortek feeding tube in place Extremities: Edema UE, L arm contracture distal to AVG No LE Edema Dialysis Access: L AVG + T/B   Additional Objective Labs: Basic Metabolic Panel: Recent Labs  Lab 06/20/21 0541 06/22/21 1742 06/23/21 0723 06/23/21 1036 06/23/21 1048 06/25/21 1241  NA 132* 133* 131*  --  133* 128*  K 5.2* 6.6* 6.7* 3.1* 3.5 5.7*  CL 94* 97* 93*  --  93* 92*  CO2 30 28 32  --   --  27  GLUCOSE 152* 94 99  --  113* 99  BUN 37* 41* 47*  --  20 36*  CREATININE 3.87* 3.82* 4.08*  --  1.90* 3.97*  CALCIUM 7.5* 7.8* 7.9*  --   --  7.9*  PHOS 2.7 2.6  --   --   --  3.7   Liver Function Tests: Recent Labs  Lab 06/20/21 0541 06/22/21 1742 06/25/21 1241  ALBUMIN 1.9* 1.8* 1.7*   No results for input(s): LIPASE, AMYLASE in the last 168 hours. CBC: Recent Labs  Lab 06/22/21 1742 06/23/21 0723 06/23/21 1048  WBC 11.8*  --   --   HGB 6.8* 8.4* 10.2*  HCT 21.7* 26.0* 30.0*  MCV 95.6  --   --   PLT 245  --   --     Blood Culture    Component Value Date/Time   SDES URINE, CATHETERIZED 05/02/2021 0022   SPECREQUEST NONE 05/02/2021 0022   CULT  05/02/2021 0022    NO GROWTH Performed at [facility], [place] [address] [place], [place] [address]    REPTSTATUS 05/04/2021 FINAL 05/02/2021 0022    Cardiac Enzymes: No results for input(s): CKTOTAL, CKMB, CKMBINDEX, TROPONINI in the last 168 hours. CBG: Recent Labs  Lab 06/25/21 1136 06/25/21 1931 06/25/21 2351 06/26/21 0339 06/26/21 0802  GLUCAP 95 128* 184* 199* 180*   Iron Studies: No results for input(s): IRON, TIBC, TRANSFERRIN, FERRITIN in the last 72 hours. _1 @ Studies/Results: DG Abd Portable 1V  Result Date: 06/25/2021 CLINICAL DATA:  Feeding tube placement. EXAM: PORTABLE ABDOMEN - 1 VIEW COMPARISON:  Radiographs 06/23/2021 and 06/13/2021. FINDINGS: 1151 hours. Tip of the feeding tube projects over the right upper quadrant of the abdomen, consistent with location in the distal stomach or proximal duodenum. The visualized bowel gas pattern is nonobstructive. Convex left thoracolumbar scoliosis again noted. IMPRESSION: Feeding tube projects to the  level of the distal stomach or proximal duodenum. Electronically Signed   By: Richardean Sale M.D.   On: 06/25/2021 12:34   Medications:  sodium chloride     feeding supplement (OSMOLITE 1.5 CAL) 1,000 mL (06/25/21 1936)   iron sucrose 100 mg (06/25/21 1610)    apixaban  2.5 mg Per Tube BID   atorvastatin  40 mg Per Tube Daily   collagenase   Topical Daily   darbepoetin (ARANESP) injection - DIALYSIS  150 mcg Intravenous Q Mon-HD   feeding supplement (PROSource TF)  45 mL Per Tube Daily   gabapentin  100 mg Per Tube QHS   insulin aspart  0-6 Units Subcutaneous Q4H   lactulose  20 g Oral Daily   midodrine  10 mg Per Tube TID WC   mirtazapine  7.5 mg Per Tube QHS   multivitamin  1 tablet Per Tube QHS   pantoprazole sodium  40 mg Per Tube Daily   sodium chloride flush  3 mL  Intravenous Q12H   thiamine  100 mg Per Tube Daily   venlafaxine  37.5 mg Per Tube BID     OP Dialysis Orders: AF MWF  3h 40mn 400/500    62.5kg    3K/2.5 Ca    P2  AVG HeRO   Hep none - Mircera 50 q 2 wks  (7/13)  Venofer 50 q wk - Parsabiv 529mIV q HDlower extremity edema, left upper arm swelling, nontender dialysis Access: Left AVG    Assessment/Plan AMS/FTT: Providers and palliative care met w/ family and discussed pt's decline. Due to declining mental and physical status we don't anticipate she will be able to do OP dialysis.  Pt will have to be able to sit in a chair for 4-6 hrs to qualify for OP HD w/ our group. If she is able to sit for dialysis and cooperate, then the next step would to re-CLIP the patient as her OP HD unit will not take her back.  Tolerated dialysis x1 in chair last week. HD in chair again today    2.   Melena/Rectal bleeding - GI consulted. Hx rectal ulcers.  EGD 7/27 normal, flex sig with mucosal ulceration c/w sterocoral ulcers, non bleeding.  Last hgb 10.2=8/29 stable ESRD: Now on MWF schedule. Last HD 08/29. Next HD 06/27/2021 Prior issues with cannulation on 8/20 , [facility] but no issues since [facility].  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  mild Increased volume noted on exam, getting under dry weight, will need to lower on d/c.  Attempt UF 2L  HD  Nutrition: Albumin 2.1 > 1.8 continue protein supps.  TF per RD once Cortrak in place.  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  Anemia  - last Hgb 10.2 Aranesp 150 mcg given 08/29 Tsat 27%, will start iron load. X3    with ferritin 1070.  Dispo: DNR/DNI. Poor prognosis. Palliative care has seen see #1 Appreciate their involvement. We do not belive dialysis is adding  to her quality of life. Per family communication  family wishes to continue. Have asked primary team that family members sit with patient during HD to see the reality of situation and to hopefully console her as she cries entire treatment, asking  for help.   Gissele Narducci H. Ausha Sieh NP-C 06/26/2021, 10:13 AM  [employer] [phone]

## 2021-06-27 ENCOUNTER — Inpatient Hospital Stay (HOSPITAL_COMMUNITY): Payer: Medicare Other

## 2021-06-27 DIAGNOSIS — R627 Adult failure to thrive: Secondary | ICD-10-CM | POA: Diagnosis not present

## 2021-06-27 DIAGNOSIS — R5081 Fever presenting with conditions classified elsewhere: Secondary | ICD-10-CM | POA: Diagnosis not present

## 2021-06-27 DIAGNOSIS — N186 End stage renal disease: Secondary | ICD-10-CM | POA: Diagnosis not present

## 2021-06-27 DIAGNOSIS — R1312 Dysphagia, oropharyngeal phase: Secondary | ICD-10-CM | POA: Diagnosis not present

## 2021-06-27 HISTORY — PX: IR US GUIDE VASC ACCESS LEFT: IMG2389

## 2021-06-27 HISTORY — PX: IR FLUORO GUIDE CV LINE LEFT: IMG2282

## 2021-06-27 LAB — RENAL FUNCTION PANEL
Albumin: 1.9 g/dL — ABNORMAL LOW (ref 3.5–5.0)
Anion gap: 11 (ref 5–15)
BUN: 36 mg/dL — ABNORMAL HIGH (ref 8–23)
CO2: 28 mmol/L (ref 22–32)
Calcium: 8.1 mg/dL — ABNORMAL LOW (ref 8.9–10.3)
Chloride: 94 mmol/L — ABNORMAL LOW (ref 98–111)
Creatinine, Ser: 4 mg/dL — ABNORMAL HIGH (ref 0.44–1.00)
GFR, Estimated: 11 mL/min — ABNORMAL LOW (ref >60)
Glucose, Bld: 110 mg/dL — ABNORMAL HIGH (ref 70–99)
Phosphorus: 2.7 mg/dL (ref 2.5–4.6)
Potassium: 6.3 mmol/L (ref 3.5–5.1)
Sodium: 133 mmol/L — ABNORMAL LOW (ref 135–145)

## 2021-06-27 LAB — CBC WITH DIFFERENTIAL/PLATELET
Abs Immature Granulocytes: 0.04 10*3/uL (ref 0.00–0.07)
Basophils Absolute: 0.1 10*3/uL (ref 0.0–0.1)
Basophils Relative: 0 %
Eosinophils Absolute: 0.2 10*3/uL (ref 0.0–0.5)
Eosinophils Relative: 2 %
HCT: 31 % — ABNORMAL LOW (ref 36.0–46.0)
Hemoglobin: 9.4 g/dL — ABNORMAL LOW (ref 12.0–15.0)
Immature Granulocytes: 0 %
Lymphocytes Relative: 10 %
Lymphs Abs: 1.2 10*3/uL (ref 0.7–4.0)
MCH: 29.6 pg (ref 26.0–34.0)
MCHC: 30.3 g/dL (ref 30.0–36.0)
MCV: 97.5 fL (ref 80.0–100.0)
Monocytes Absolute: 1.1 10*3/uL — ABNORMAL HIGH (ref 0.1–1.0)
Monocytes Relative: 9 %
Neutro Abs: 8.9 10*3/uL — ABNORMAL HIGH (ref 1.7–7.7)
Neutrophils Relative %: 79 %
Platelets: 420 10*3/uL — ABNORMAL HIGH (ref 150–400)
RBC: 3.18 MIL/uL — ABNORMAL LOW (ref 3.87–5.11)
RDW: 18.3 % — ABNORMAL HIGH (ref 11.5–15.5)
WBC: 11.4 10*3/uL — ABNORMAL HIGH (ref 4.0–10.5)
nRBC: 0.2 % (ref 0.0–0.2)

## 2021-06-27 LAB — GLUCOSE, CAPILLARY
Glucose-Capillary: 136 mg/dL — ABNORMAL HIGH (ref 70–99)
Glucose-Capillary: 190 mg/dL — ABNORMAL HIGH (ref 70–99)
Glucose-Capillary: 144 mg/dL — ABNORMAL HIGH (ref 70–99)
Glucose-Capillary: 85 mg/dL (ref 70–99)

## 2021-06-27 MED ORDER — GABAPENTIN 250 MG/5ML PO SOLN
100.0000 mg | Freq: Two times a day (BID) | ORAL | Status: DC
  Administered 2021-06-28 – 2021-07-20 (×40): 100 mg
  Filled 2021-06-27 (×50): qty 2

## 2021-06-27 MED ORDER — LIDOCAINE HCL (PF) 1 % IJ SOLN
INTRAMUSCULAR | Status: AC | PRN
  Administered 2021-06-27: 5 mL

## 2021-06-27 MED ORDER — LIDOCAINE HCL 1 % IJ SOLN
INTRAMUSCULAR | Status: AC
  Filled 2021-06-27: qty 20

## 2021-06-27 MED ORDER — SODIUM CHLORIDE 0.9 % IV SOLN
100.0000 mL | INTRAVENOUS | Status: DC | PRN
Start: 2021-06-27 — End: 2021-06-28

## 2021-06-27 MED ORDER — CHLORHEXIDINE GLUCONATE CLOTH 2 % EX PADS
6.0000 | MEDICATED_PAD | Freq: Every day | CUTANEOUS | Status: DC
  Administered 2021-06-28 – 2021-08-05 (×38): 6 via TOPICAL

## 2021-06-27 MED ORDER — HEPARIN SODIUM (PORCINE) 1000 UNIT/ML IJ SOLN
INTRAMUSCULAR | Status: AC
  Filled 2021-06-27: qty 4

## 2021-06-27 MED ORDER — LIDOCAINE-PRILOCAINE 2.5-2.5 % EX CREA
1.0000 | TOPICAL_CREAM | CUTANEOUS | Status: DC | PRN
Start: 2021-06-27 — End: 2021-06-28

## 2021-06-27 MED ORDER — PENTAFLUOROPROP-TETRAFLUOROETH EX AERO: 1.0000 "application " | INHALATION_SPRAY | CUTANEOUS | Status: DC | PRN

## 2021-06-27 MED ORDER — HEPARIN SODIUM (PORCINE) 1000 UNIT/ML IJ SOLN
INTRAMUSCULAR | Status: AC
  Filled 2021-06-27: qty 1

## 2021-06-27 MED ORDER — METAXALONE 800 MG PO TABS
400.0000 mg | ORAL_TABLET | Freq: Three times a day (TID) | ORAL | Status: DC
  Administered 2021-06-28 – 2021-06-29 (×5): 400 mg via ORAL
  Filled 2021-06-27: qty 1
  Filled 2021-06-27 (×2): qty 0.5
  Filled 2021-06-27: qty 1
  Filled 2021-06-27 (×2): qty 0.5
  Filled 2021-06-27: qty 1
  Filled 2021-06-27: qty 0.5

## 2021-06-27 MED ORDER — SODIUM ZIRCONIUM CYCLOSILICATE 10 G PO PACK
10.0000 g | PACK | ORAL | Status: AC
  Administered 2021-06-27: 10 g
  Filled 2021-06-27: qty 1

## 2021-06-27 MED ORDER — GLUCERNA SHAKE PO LIQD
237.0000 mL | Freq: Three times a day (TID) | ORAL | Status: DC
  Administered 2021-06-27 – 2021-06-28 (×4): 237 mL via ORAL

## 2021-06-27 MED ORDER — OXYCODONE HCL 5 MG PO TABS
5.0000 mg | ORAL_TABLET | Freq: Four times a day (QID) | ORAL | Status: DC | PRN
Start: 2021-06-27 — End: 2021-07-06
  Administered 2021-06-29 – 2021-07-04 (×3): 5 mg
  Filled 2021-06-27 (×3): qty 1

## 2021-06-27 MED ORDER — LIDOCAINE HCL (PF) 1 % IJ SOLN: 5.0000 mL | INTRAMUSCULAR | Status: DC | PRN

## 2021-06-27 MED ORDER — SODIUM CHLORIDE 0.9 % IV SOLN: 100.0000 mL | INTRAVENOUS | Status: DC | PRN

## 2021-06-27 NOTE — Progress Notes (Signed)
Informed by dialysis RN that no bruit on LUE AVG today. Attempted to stick anyway - no blood return. Unable to be dialyzed today. Will return to her room. Spoke to son [name] and will consult IR for declot. She has significant edema of LUE AVG and contracture from prior CVA. If they are unable to declot, may need tunneled cath placement.  No labs today - will order RFP to assure no hyperkalemia.  She has been vomiting/dry heaving during her time in the HD unit today.  Veneta Penton, PA-C [employer] Pager [phone]

## 2021-06-27 NOTE — Progress Notes (Signed)
Critical value of K 6.3 was reported to MD.

## 2021-06-27 NOTE — Progress Notes (Signed)
Nutrition Follow-up  DOCUMENTATION CODES:  Severe malnutrition in context of chronic illness  INTERVENTION:  Continue TF via Cortrak: -Osmolite 1.5 @ 33m/hr (12028md)  -4513mrosource TF daily  Provides 1840 kcals, 86g protein, 914m44mee water  NUTRITION DIAGNOSIS:  Severe Malnutrition related to chronic illness (ESRD on HD, prior CVA) as evidenced by percent weight loss, severe muscle depletion, energy intake < or equal to 75% for > or equal to 1 month. -- ongoing  GOAL:  Patient will meet greater than or equal to 90% of their needs -- met with TF  MONITOR:  PO intake, Supplement acceptance, Diet advancement, Labs, Weight trends, Skin, I & O's  REASON FOR ASSESSMENT:  Consult Enteral/tube feeding initiation and management  ASSESSMENT:  75 y42female with PMH of ESRD on HD MWF, HTN, HLD, T2DM, CVA s/p TPA 02/2021, ?blood clot in UE, left hemiparesis from CVA who presented to ER for dark stool with + fecal occult.  8/17 - cortrak placed (gastric tip) 8/19 - cortrak replaced (gastric tip) 8/29 - cortrak replaced (gastric tip confirmed via xray)  After team was unable to palapate a bruit or aspirate blood return during HD on 9/2, IR performed a shuntogram where it was determined that the AV graft was thrombosis. IR placed LIJ temp dialysis catheter at that time and request has been made for temp to tunneled cath conversation -- tentatively scheduled for 9/7.   Per MD, pt with no meaningful recovery and will need to sit through HD treatments before being discharged. Unfortunately, no bruit on LUE AVG today and pt unable to be dialyzed. IR consulted for declot. Pt noted to have significant edema of LUE AVG and contracture from prior CVA.   Discussed pt with NP. Over the weekend, TF was held in hopes of improving pt's po intake but pt continued to refuse all POs and, per RN, "seemed entirely unaware that she was not eating." TF to be restarted via Cortrak. Note Surgery indicated plan  to hold off on placement G-tube until pt can tolerate HD as both the inability to tolerate HD and the presence of Cortrak are inhibiting pt's discharge and G-tube placement would be an unnecessary high risk procedure if pt is unable to tolerate HD in chair to be able to discharge.   Previous/Current regimen: Osmolite 1.5 @ 50ml23m(1200ml/21m/ 45ml P62murce TF daily   PO intake: 0% x last 8 recorded meals   Medications: Scheduled Meds:  atorvastatin  40 mg Per Tube Daily   Chlorhexidine Gluconate Cloth  6 each Topical Daily   collagenase   Topical Daily   darbepoetin (ARANESP) injection - DIALYSIS  150 mcg Intravenous Q Mon-HD   feeding supplement (PROSource TF)  45 mL Per Tube Daily   gabapentin  100 mg Per Tube Q12H   heparin sodium (porcine)       insulin aspart  0-6 Units Subcutaneous Q4H   lactulose  20 g Per Tube Daily   metaxalone  400 mg Per Tube TID   midodrine  10 mg Per Tube TID WC   mirtazapine  7.5 mg Per Tube QHS   multivitamin  1 tablet Per Tube QHS   pantoprazole sodium  40 mg Per Tube Daily   sodium chloride flush  10-40 mL Intracatheter Q12H   sodium chloride flush  3 mL Intravenous Q12H   thiamine  100 mg Per Tube Daily   venlafaxine  37.5 mg Per Tube BID  Continuous Infusions:  sodium chloride  ceFAZolin (ANCEF) IV     feeding supplement (OSMOLITE 1.2 CAL) 1,000 mL (07/01/21 1347)   iron sucrose Stopped (06/30/21 1416)   Labs: Recent Labs  Lab 06/28/21 0249 06/29/21 0140 07/01/21 0647  NA 132* 131* 130*  K 5.9* 5.2* 4.6  CL 95* 95* 92*  CO2 _0 BUN 28* 33* 48*  CREATININE 3.40* 3.98* 4.88*  CALCIUM 8.0* 8.0* 8.3*  PHOS 2.7 3.3 3.7  GLUCOSE 82 82 90  CBGs 72-162 x24 hours  Pre-HD weight: 105.45 kg Post-HD weight: 102 kg Question accuracy of these weights as pt has previously weighed between 56-62 kg Last HD 9/6, 3.5L net UF No UOP documented x24 hours I/O: +7L since admit  RN edema assessment notes moderate pitting edema to all  extremities and deep pitting facial edema.   Diet Order:   Diet Order             Diet NPO time specified  Diet effective now                  EDUCATION NEEDS:  Education needs have been addressed  Skin:  Skin Assessment: Skin Integrity Issues: Skin Integrity Issues:: Stage II Stage II: Pressure Injury on coccyx; Open wound on buttocks  Last BM:  9/6 type 7  Height:  Ht Readings from Last 1 Encounters:  05/20/21 _1  (1.702 m)   Weight:  Wt Readings from Last 1 Encounters:  07/01/21 102 kg   BMI:  Body mass index is 35.22 kg/m.  Estimated Nutritional Needs:  Kcal:  1700-1900 Protein:  85-100 grams Fluid:  1L +UOP    Larkin Ina, MS, RD, LDN (she/her/hers) RD pager number and weekend/on-call pager number located in [place].

## 2021-06-27 NOTE — TOC Progression Note (Signed)
Transition of Care [facility]) - Progression Note    Patient Details  Name: Amy [name] MRN: [id number] Date of Birth: 08-12-1946  Transition of Care [facility]) CM/SW [place], RN Phone Number: 06/27/2021, 2:18 PM  Clinical Narrative:    CM and MSW with DTP Team will continue to follow the patient for transitions of care needs.  Patient is continuing to be assisted to the chair so that she can progress to outpatient HD.  The patient has a Cortrak at this time and unable to transition to a SNF at this time.   Expected Discharge Plan: [place] Barriers to Discharge: Continued Medical Work up  Expected Discharge Plan and Services Expected Discharge Plan: [place] In-house Referral: Clinical Social Work     Living arrangements for the past 2 months: [place] (Patient came to hospital from [place])                                       Social Determinants of Health (SDOH) Interventions    Readmission Risk Interventions No flowsheet data found.

## 2021-06-27 NOTE — Progress Notes (Addendum)
[facility] PROGRESS NOTE  Amy [name] [id number] DOB: 25-Apr-1946 DOA: 05/19/2021 PCP: [name], MD  Status:Inpatient  Remains inpatient appropriate because:Unsafe d/c plan, IV treatments appropriate due to intensity of illness or inability to take PO, and Inpatient level of care appropriate due to severity of illness  Dispo: The patient is from: Home              Anticipated d/c is to: SNF              Patient currently is medically stable to d/c.   Difficult to place patient Yes              Barriers to discharge: Dialysis patient with markedly decreased mobility-we will need to prove tolerance of sitting in chair for hemodialysis while here as well as tolerating sitting in wheelchair for up to 1 hour x 2 to replicate transport process.  Cannot discharge and proceed with outpatient hemodialysis unless this is accomplished.      Level of care: Med-Surg  Code Status:  Family Communication:  DVT prophylaxis:  COVID vaccination status:    HPI: 75 y.o. 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  Patient presented to the ED on 7/25 with dark stool.  On presentation, hemoglobin was 9.6; GI was consulted.  She underwent EGD which was normal and flexible sigmoidoscopy which showed nonbleeding distal rectal ulcers/stercoral ulcers.  Subsequently, GI signed off.  Neurology was also consulted for altered mental status; MRI brain was similar to prior MRI at [facility] with a large right MCA stroke.   Nephrology has been following for dialysis needs; nephrology is stating that patient will not be accepted back to outpatient dialysis at this time due to her underlying  confusion, weakness and inability to sit in a dialysis recliner as an outpatient.   Due to her poor oral intake, family wanted a PEG tube placement.  IR tried but was unable to place because of overlying transverse colon.   General surgery was consulted for G-tube placement but family currently undecided.  She has an NG tube in place.   Palliative care following for goals of care discussion.  Subjective: Awake and alert.  Requesting to get out of bed and requesting something to eat.  Of note when nursing staff attempted to feed patient she will not eat.  Objective: Vitals:   06/26/21 2024 06/27/21 0603  BP: 136/84 (!) 141/68  Pulse: (!) 109 (!) 109  Resp: 20 19  Temp: 97.8 F (36.6 C) 98.4 F (36.9 C)  SpO2: 96% 96%    Intake/Output Summary (Last 24 hours) at 06/27/2021 0759 Last data filed at 06/26/2021 1809 Gross per 24 hour  Intake 792.5 ml  Output 0 ml  Net 792.5 ml   Filed Weights   06/18/21 1610 06/18/21 1925  Weight: 62 kg 60 kg    Exam:  Constitutional: NAD, calm, alert Respiratory: Lungs CTA, normal respiratory effort, normal pulse oximetry.  Room air Cardiovascular: Heart sounds S1-S2, chronic LUE edema, no peripheral edema.  Pulse regular.  Normotensive Abdomen: no tenderness, no masses palpated. Bowel sounds positive. LBM 9/01-cortrack for feeding Neurologic: CN 2-12 grossly intact -today eyes are midline and she is able to track during conversation. Sensation intact,  Strength 3/5 on the right.  With chronic left hemiparesis from prior CVA.  Continues to have left-sided neglect. Psychiatric: Awake and oriented to name and place.  More appropriate with conversational speech today despite being confused.  Today asking for food and to get out of bed to chair   Assessment/Plan: Acute problems: Acute on chronic GI bleeding from stercoral ulcers -Patient presented with anemia,  underwent EGD which was normal and flex sigmoidoscopy with clean base, nonbleeding distal  rectal ulcers (stercoral ulcers). Gastroenterology now signed off  -Hemoglobin stable (7-8).     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  Merrily consistent with encephalopathy Lipid panel and hemoglobin A1c within normal limits.  Continue Lipitor Diet as per SLP recommendations.  Extremely poor to no oral intake Cortrak tube in place for enteral feedings **As of 9/2 patient requesting to eat so will stop continuous tube feedings and encourage diet.  Staff aware that if need to change to regular unrestricted diet can do so to improve intake.  Also give 1 can Glucerna after meals TID per tube and begin calorie count** Discussed with surgical team.  Patient needs to prove tolerance of HD in chair before consideration for open G-tube noting this would be a high risk procedure.    ESRD on HD Per Nephro Patient is not a candidate for continued outpatient HD given her confusion, weakness and inability to sit up in a chair for HD outpatient.  Nephrology suggested transition to hospice care but family resistant.  Palliative care attempted to meet in person with son on 8/31 but he was a no-show.  She notified him by telephone and he insisted that as long as his mother could speak that she would remain full aggressive care.[name] Physically patient appears to be tolerating HD in chair although emotionally she becomes frustrated and anxious during dialysis 9/2 unsure if pain contributing to issues during dialysis but have increased Neurontin from HS to BID and have increased frequency of Oxy IR **9/02: LUE AVG appears occluded- no blood return w/ attempts to dialyze so unable to have HD today-Nephro suspects may need TDC  Low-grade fever.   Noted on 9/1 without recurrence as of 9/2. Blood cultures pending  given high risk for bacteremia 9/2 request CBC with differential to be obtained when patient goes to dialysis this afternoon.  Hyperkalemia/hypophosphatemia Management per nephrology HD -supplementation as needed   Hypotension Blood pressure currently is stable on midodrine.   Failure to thrive /Generalized deconditioning  /very poor oral intake PEG tube was desired by family but could not be placed because of overlying transverse colon.   Tube feeding on hold as above in favor of attempting to feed orally with Glucerna shakes supplement Discussed with general surgery on 8/31 and if patient can tolerate dialysis in chair and plan is to discuss possible open G-tube placed  Constipation No further abdominal pain and recent films without evidence of constipation Patient was on chronic narcotics prior to admission and these remain in place Patient was on 40 g Chronulac daily prn.  Continue scheduled Chronulac 20 g daily and presentation with rectal ulcers likely secondary to constipation chronic  Type 2 diabetes mellitus Episodes of hypoglycemia Hemoglobin A1c 5.1.  Diet controlled at baseline  Chronic thrombus left upper extremity Continue Eliquis Elevate as appropriate  Stage  II mid coccyx pressure injury, present on admission Continue wound care    GERD Could also be contributing to patient's poor oral intake Increased frequency of Protonix to BID sitter adding Pepcid     Data Reviewed: Basic Metabolic Panel: Recent Labs  Lab 06/22/21 1742 06/23/21 0723 06/23/21 1036 06/23/21 1048 06/25/21 1241  NA 133* 131*  --  133* 128*  K 6.6* 6.7* 3.1* 3.5 5.7*  CL 97* 93*  --  93* 92*  CO2 28 32  --   --  27  GLUCOSE 94 99  --  113* 99  BUN 41* 47*  --  20 36*  CREATININE 3.82* 4.08*  --  1.90* 3.97*  CALCIUM 7.8* 7.9*  --   --  7.9*  PHOS 2.6  --   --   --  3.7   Liver Function Tests: Recent Labs  Lab 06/22/21 1742 06/25/21 1241  ALBUMIN 1.8* 1.7*   No results for  input(s): LIPASE, AMYLASE in the last 168 hours. No results for input(s): AMMONIA in the last 168 hours. CBC: Recent Labs  Lab 06/22/21 1742 06/23/21 0723 06/23/21 1048  WBC 11.8*  --   --   HGB 6.8* 8.4* 10.2*  HCT 21.7* 26.0* 30.0*  MCV 95.6  --   --   PLT 245  --   --    Cardiac Enzymes: No results for input(s): CKTOTAL, CKMB, CKMBINDEX, TROPONINI in the last 168 hours. BNP (last 3 results) No results for input(s): BNP in the last 8760 hours.  ProBNP (last 3 results) No results for input(s): PROBNP in the last 8760 hours.  CBG: Recent Labs  Lab 06/26/21 1203 06/26/21 1608 06/26/21 1935 06/26/21 2343 06/27/21 0341  GLUCAP 151* 164* 102* 114* 136*    No results found for this or any previous visit (from the past 240 hour(s)).   Studies: DG Abd Portable 1V  Result Date: 06/25/2021 CLINICAL DATA:  Feeding tube placement. EXAM: PORTABLE ABDOMEN - 1 VIEW COMPARISON:  Radiographs 06/23/2021 and 06/13/2021. FINDINGS: 1151 hours. Tip of the feeding tube projects over the right upper quadrant of the abdomen, consistent with location in the distal stomach or proximal duodenum. The visualized bowel gas pattern is nonobstructive. Convex left thoracolumbar scoliosis again noted. IMPRESSION: Feeding tube projects to the level of the distal stomach or proximal duodenum. Electronically Signed   By: Richardean Sale M.D.   On: 06/25/2021 12:34    Scheduled Meds:  apixaban  2.5 mg Per Tube BID   atorvastatin  40 mg Per Tube Daily   collagenase   Topical Daily   darbepoetin (ARANESP) injection - DIALYSIS  150 mcg Intravenous Q Mon-HD   feeding supplement (PROSource TF)  45 mL Per Tube Daily   gabapentin  100 mg Per Tube QHS   insulin aspart  0-6 Units Subcutaneous Q4H   lactulose  20 g Oral Daily   midodrine  10 mg Per Tube TID WC   mirtazapine  7.5 mg Per Tube QHS   multivitamin  1 tablet Per Tube QHS   pantoprazole sodium  40 mg Per Tube Daily   sodium chloride flush  3 mL  Intravenous Q12H   thiamine  100 mg Per Tube Daily   venlafaxine  37.5 mg Per Tube BID   Continuous Infusions:  sodium chloride     sodium chloride     sodium chloride     feeding supplement (OSMOLITE 1.5 CAL) 1,000 mL (06/26/21 1251)   iron sucrose 100 mg (  06/25/21 1610)    Principal Problem:   GI bleed Active Problems:   Essential hypertension   Type 2 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, with long-term current use of insulin (HCC)   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   ESRD on dialysis ([place])   Dyslipidemia   Hypokalemia   Stage 2 skin ulcer of sacral region [facility])   Cerebral thrombosis with cerebral infarction   Protein-calorie malnutrition, severe   Acute GI bleeding   Oropharyngeal dysphagia   Failure to thrive in adult   Inadequate oral intake   Clogged feeding tube   Fever   Consultants: Vascular surgery Nephrology Gastroenterology Neurology Palliative medicine General surgery  Procedures: EEG Echocardiogram Cortrack EGD  Antibiotics: Ceftriaxone 8/3 through 8/5   Time spent: 45 minutes    Erin Hearing ANP  [facility] 7 am - 330 pm/M-F for direct patient care and secure chat Please refer to Amion for contact info 38  days

## 2021-06-27 NOTE — Procedures (Signed)
Interventional Radiology Procedure Note  Procedure: Left non tunneled HD catheter placement. Tips in the RA and ready for use.   Complications: None  Estimated Blood Loss: None  Recommendations: - Routine line care  Signed,  Criselda Peaches, MD

## 2021-06-27 NOTE — Progress Notes (Signed)
Patient arrived for dialysis unable to feel bruit hear thrill. Alerted PA Valetta Fuller and notified floor RN.

## 2021-06-27 NOTE — Progress Notes (Signed)
75 y.o. female inpatient. 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 HD access is a left upper extremity  loop AV graft created on 1.6.22 and a prior brachial to axillary vein AVG 11.4.21  Team attempted to perform dialysis today but was unable to palpate a bruit or aspirate blood return. Team is requesting a shuntogram with possible dialysis access placement.  Last venous doppler performed on 8.28.22 due to swelling. Last fisulogram  performed by vascular on 4.4.22 no intervention Last dose of eliquis given on 9.2.22 @ 8:56. Tube feeds shut off at 11:00. O   IR consulted for possible shuntogram with possible dialysis access placemen.. Case has been reviewed and procedure approved by Dr. Laurence Ferrari.   Patient tentatively scheduled for 9.2.22. Due to patient's NPO status and timing of last dose of eliquis if dialysis access needed the patient will have a temp catheter placed and she can be scheduled for q tempt to tunneled HD catheter conversion.   IR will call patient when ready.

## 2021-06-27 NOTE — Progress Notes (Addendum)
[facility] Progress Note   Subjective:  Seen in room. Looks pretty rough today, mumbling to herself.  Objective Vitals:   06/26/21 1740 06/26/21 2024 06/27/21 0603 06/27/21 0944  BP: (!) 141/73 136/84 (!) 141/68 (!) 171/73  Pulse: (!) 103 (!) 109 (!) 109 (!) 107  Resp: _0 Temp: 98 F (36.7 C) 97.8 F (36.6 C) 98.4 F (36.9 C) 98 F (36.7 C)  TempSrc:  Oral Oral   SpO2: 100% 96% 96% 97%  Weight:      Height:       Physical Exam General: Chronically ill woman, NAD. NG tube in place Heart: RRR; no murmur Lungs: CTA anteriorly Abdomen: soft Extremities: 3+ LUE edema, 1+ RUE edema Dialysis Access: L AVG  Additional Objective Labs: Basic Metabolic Panel: Recent Labs  Lab 06/22/21 1742 06/23/21 0723 06/23/21 1036 06/23/21 1048 06/25/21 1241  NA 133* 131*  --  133* 128*  K 6.6* 6.7* 3.1* 3.5 5.7*  CL 97* 93*  --  93* 92*  CO2 28 32  --   --  27  GLUCOSE 94 99  --  113* 99  BUN 41* 47*  --  20 36*  CREATININE 3.82* 4.08*  --  1.90* 3.97*  CALCIUM 7.8* 7.9*  --   --  7.9*  PHOS 2.6  --   --   --  3.7   Liver Function Tests: Recent Labs  Lab 06/22/21 1742 06/25/21 1241  ALBUMIN 1.8* 1.7*   CBC: Recent Labs  Lab 06/22/21 1742 06/23/21 0723 06/23/21 1048  WBC 11.8*  --   --   HGB 6.8* 8.4* 10.2*  HCT 21.7* 26.0* 30.0*  MCV 95.6  --   --   PLT 245  --   --    Studies/Results: DG Abd Portable 1V  Result Date: 06/25/2021 CLINICAL DATA:  Feeding tube placement. EXAM: PORTABLE ABDOMEN - 1 VIEW COMPARISON:  Radiographs 06/23/2021 and 06/13/2021. FINDINGS: 1151 hours. Tip of the feeding tube projects over the right upper quadrant of the abdomen, consistent with location in the distal stomach or proximal duodenum. The visualized bowel gas pattern is nonobstructive. Convex left thoracolumbar scoliosis again noted. IMPRESSION: Feeding tube projects to the level of the distal stomach or proximal duodenum. Electronically Signed   By: Richardean Sale M.D.   On: 06/25/2021 12:34    Medications:  sodium chloride     sodium chloride     sodium chloride     iron sucrose 100 mg (06/25/21 1610)    apixaban  2.5 mg Per Tube BID   atorvastatin  40 mg Per Tube Daily   collagenase   Topical Daily   darbepoetin (ARANESP) injection - DIALYSIS  150 mcg Intravenous Q Mon-HD   feeding supplement (GLUCERNA SHAKE)  237 mL Oral TID WC   feeding supplement (PROSource TF)  45 mL Per Tube Daily   gabapentin  100 mg Per Tube Q12H   insulin aspart  0-6 Units Subcutaneous Q4H   lactulose  20 g Oral Daily   midodrine  10 mg Per Tube TID WC   mirtazapine  7.5 mg Per Tube QHS   multivitamin  1 tablet Per Tube QHS   pantoprazole sodium  40 mg Per Tube Daily   sodium chloride flush  3 mL Intravenous Q12H   thiamine  100 mg Per Tube Daily   venlafaxine  37.5 mg Per Tube BID    Dialysis Orders: AF MWF  3h 34mn 400/500  62.5kg    3K/2.5 Ca    P2  AVG HeRO   Hep none - Mircera 50 q 2 wks  (7/13)  Venofer 50 q wk - Parsabiv 30m IV q HD   Assessment/Plan: AMS/FTT: Providers and palliative care met w/ family and discussed pt's decline. Due to declining mental and physical status we don't anticipate she will be able to do OP dialysis.  Pt will have to be able to sit in a chair for 4-6 hrs to qualify for OP HD w/ our group. If she is able to sit for dialysis and cooperate, then the next step would to re-CLIP the patient as her OP HD unit will not take her back.  Melena/Rectal bleeding - GI consulted. Hx rectal ulcers.  EGD 7/27 normal, flex sig with mucosal ulceration c/w sterocoral ulcers, non bleeding. Hgb 10.2, holding. ESRD: Continue HD per MWF schedule - for HD today. Prior issues with [facility] on 8[facility]20 , [facility] since [facility].  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 Contracture distal to  AVG Hypertension/volume: BP high today, on midodrine 182mTID with significant overload on exam. Need to ^ UFG with HD as tolerated, can give IV albumin prn. Nutrition: Albumin 1.7. Getting TFs via NG tube. 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  Anemia: Hgb 10.2, continue Aranesp [id number] Monday. S/p 2 of 3 doses IV iron. Dispo: DNR/DNI. Poor prognosis. Palliative care has seen see #1. Appreciate their involvement. We do not belive dialysis is adding to her quality of life. Per family communication  family wishes to continue. Have asked primary team that family members sit with patient during HD to see the reality of situation and to hopefully console her as she cries entire treatment, asking for help.     KatVeneta PentonA-C 06/27/2021, 10:56 AM  [employer]

## 2021-06-27 NOTE — Progress Notes (Signed)
Previously Yellow MEWS. No further management done.   06/27/21 1644  Assess: MEWS Score  Temp 97.6 F (36.4 C)  BP 94/70  Pulse Rate (!) 104  Resp 18  SpO2 96 %  Assess: MEWS Score  MEWS Temp 0  MEWS Systolic 1  MEWS Pulse 1  MEWS RR 0  MEWS LOC 0  MEWS Score 2  MEWS Score Color Yellow  Assess: if the MEWS score is Yellow or Red  Were vital signs taken at a resting state? Yes  Focused Assessment No change from prior assessment  Early Detection of Sepsis Score *See Row Information* Medium  MEWS guidelines implemented *See Row Information* No, previously yellow, continue vital signs every 4 hours  Treat  Pain Scale 0-10  Pain Score 0  Document  Patient Outcome Stabilized after interventions  Progress note created (see row info) Yes

## 2021-06-27 NOTE — Progress Notes (Signed)
See that K is 6.3 - appreciate IR consult with plan for non-tunneled line today.   Ordered Lokelma 10g now to give via TF tube.  Will bring for dialysis tonight after line in place. Dr. Justin Mend is on call tonight and HD unit is aware.  Veneta Penton, PA-C [employer] Pager [phone]

## 2021-06-28 DIAGNOSIS — K922 Gastrointestinal hemorrhage, unspecified: Secondary | ICD-10-CM | POA: Diagnosis not present

## 2021-06-28 DIAGNOSIS — N186 End stage renal disease: Secondary | ICD-10-CM | POA: Diagnosis not present

## 2021-06-28 DIAGNOSIS — I6330 Cerebral infarction due to thrombosis of unspecified cerebral artery: Secondary | ICD-10-CM | POA: Diagnosis not present

## 2021-06-28 DIAGNOSIS — D638 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: Secondary | ICD-10-CM | POA: Diagnosis not present

## 2021-06-28 LAB — RENAL FUNCTION PANEL
Albumin: 1.9 g/dL — ABNORMAL LOW (ref 3.5–5.0)
Anion gap: 11 (ref 5–15)
BUN: 28 mg/dL — ABNORMAL HIGH (ref 8–23)
CO2: 26 mmol/L (ref 22–32)
Calcium: 8 mg/dL — ABNORMAL LOW (ref 8.9–10.3)
Chloride: 95 mmol/L — ABNORMAL LOW (ref 98–111)
Creatinine, Ser: 3.4 mg/dL — ABNORMAL HIGH (ref 0.44–1.00)
GFR, Estimated: 14 mL/min — ABNORMAL LOW (ref >60)
Glucose, Bld: 82 mg/dL (ref 70–99)
Phosphorus: 2.7 mg/dL (ref 2.5–4.6)
Potassium: 5.9 mmol/L — ABNORMAL HIGH (ref 3.5–5.1)
Sodium: 132 mmol/L — ABNORMAL LOW (ref 135–145)

## 2021-06-28 LAB — GLUCOSE, CAPILLARY
Glucose-Capillary: 93 mg/dL (ref 70–99)
Glucose-Capillary: 80 mg/dL (ref 70–99)
Glucose-Capillary: 56 mg/dL — ABNORMAL LOW (ref 70–99)
Glucose-Capillary: 189 mg/dL — ABNORMAL HIGH (ref 70–99)
Glucose-Capillary: 74 mg/dL (ref 70–99)
Glucose-Capillary: 89 mg/dL (ref 70–99)
Glucose-Capillary: 96 mg/dL (ref 70–99)
Glucose-Capillary: 83 mg/dL (ref 70–99)

## 2021-06-28 MED ORDER — IOHEXOL 300 MG/ML  SOLN: 50.0000 mL | Freq: Once | INTRAMUSCULAR | Status: DC | PRN

## 2021-06-28 MED ORDER — DEXTROSE 50 % IV SOLN: 12.5000 g | INTRAVENOUS | Status: AC

## 2021-06-28 MED ORDER — NEPRO/CARBSTEADY PO LIQD
237.0000 mL | Freq: Three times a day (TID) | ORAL | Status: DC
  Administered 2021-06-28 – 2021-06-29 (×2): 237 mL via ORAL

## 2021-06-28 MED ORDER — DEXTROSE 50 % IV SOLN
INTRAVENOUS | Status: AC
  Administered 2021-06-28: 12.5 mL
  Filled 2021-06-28: qty 50

## 2021-06-28 MED ORDER — SODIUM ZIRCONIUM CYCLOSILICATE 10 G PO PACK
10.0000 g | PACK | Freq: Two times a day (BID) | ORAL | Status: AC
  Administered 2021-06-28 – 2021-06-29 (×2): 10 g
  Filled 2021-06-28 (×2): qty 1

## 2021-06-28 NOTE — Progress Notes (Signed)
[facility] PROGRESS NOTE  Amy [name] [id number] DOB: 08-29-1946 DOA: 05/19/2021 PCP: [name], MD  Status:Inpatient  Remains inpatient appropriate because:Unsafe d/c plan, IV treatments appropriate due to intensity of illness or inability to take PO, and Inpatient level of care appropriate due to severity of illness  Dispo: The patient is from:               Anticipated d/c is to: SNF              Patient currently is medically stable to d/c.   Difficult to place patient Yes              Barriers to discharge: Dialysis patient with markedly decreased mobility-we will need to prove tolerance of sitting in chair for hemodialysis while here as well as tolerating sitting in wheelchair for up to 1 hour x 2 to replicate transport process.  Cannot discharge and proceed with outpatient hemodialysis unless this is accomplished.      Level of care: Med-Surg  Code Status: DO NOT RESUSCITATE. Family Communication:  DVT prophylaxis: Eliquis 2.5 mg p.o. twice daily. COVID vaccination status:  06/28/2021: Patient seen alongside patient's nurse.  Poor p.o. intake.  Patient is said not to be awake enough to eat.  Concerns for possible aspiration.  I guess calorie count is on hold.  Low blood sugars noted.  Potassium of 5.9 noted.  We will continue Lokelma for today (10 g twice daily x2 doses).  Renal panel in the morning.  Monitor blood sugar closely.  Continue to assess mental status.  Further management will depend on hospital course.  HPI: As per prior documentation "75 y.o. 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  Patient presented to the ED on 7/25 with dark  stool.  On presentation, hemoglobin was 9.6; GI was consulted.  She underwent EGD which was normal and flexible sigmoidoscopy which showed nonbleeding distal rectal ulcers/stercoral ulcers.  Subsequently, GI signed off.  Neurology was also consulted for altered mental status; MRI brain was similar to prior MRI at [facility] with a large right MCA stroke.   Nephrology has been following for dialysis needs; nephrology is stating that patient will not be accepted back to outpatient dialysis at this time due to her underlying confusion, weakness and inability to sit in a dialysis recliner as an outpatient.   Due to her poor oral intake, family wanted a PEG tube placement.  IR tried but was unable to place because of overlying transverse colon.   General surgery was consulted for G-tube placement but family currently undecided.  She has an NG tube in place.   Palliative care following for goals of care discussion".  Subjective: The patient is awake, but unable to give significant history.   Not in any distress.  Objective: Vitals:   06/28/21 0431 06/28/21 1002  BP: (!) 95/57 108/66  Pulse: 78 (!) 102  Resp: 19 16  Temp: 98.8 F (37.1 C) 97.8 F (36.6 C)  SpO2: (!) 89% 91%    Intake/Output Summary (Last 24 hours) at 06/28/2021 1241 Last data filed at 06/28/2021 0601 Gross per 24 hour  Intake 0 ml  Output 2000 ml  Net -2000 ml    [employer]  06/18/21 1610 06/18/21 1925  Weight: 62 kg 60 kg    Exam:  Constitutional: Chronically ill looking.  Not in any distress.  Contracture of the left upper extremity.  No significant history from patient.  Patient has NG tube in place. HEENT: Patient is pale.  No jaundice. Respiratory: Clear to auscultation.  U Cardiovascular: S1-S2.   Abdomen: Soft and nontender.   Neurologic: Patient is awake.  No significant history from the patient.  Contracture of the left upper extremity.  Patient moves right upper extremity.  Patient will not comply very well  with full neurological examination.    Assessment/Plan: Acute problems: Acute on chronic GI bleeding from stercoral ulcers -Patient presented with anemia,  underwent EGD which was normal and flex sigmoidoscopy with clean base, nonbleeding distal rectal ulcers (stercoral ulcers). Gastroenterology now signed off  -Hemoglobin stable (7-8). 06/28/2021: Last CBC done on 06/27/2021 revealed hemoglobin of 9.4 g/dL.   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  Merrily consistent with encephalopathy Lipid panel and hemoglobin A1c within normal limits.  Continue Lipitor Diet as per SLP recommendations.  Extremely poor to no oral intake Cortrak tube in place for enteral feedings **As of 9/2 patient requesting to eat so will stop continuous tube feedings and encourage diet.  Staff aware that if need to change to regular unrestricted diet can do so to improve intake.  Also give 1 can Glucerna after meals TID per tube and begin calorie count** Discussed with surgical team.  Patient needs to prove tolerance of HD in chair before consideration for open G-tube noting this would be a high risk procedure.    ESRD on HD Per Nephro Patient is not a candidate for continued outpatient HD given her confusion, weakness and inability to sit up in a chair for HD outpatient.  Nephrology suggested transition to hospice care but family resistant.  Palliative care attempted to meet in person with son on 8/31 but he was a no-show.  She notified him by telephone and he insisted that as long as his mother could speak that she would remain full aggressive care.[name] Physically patient appears to be tolerating HD in chair although emotionally she becomes frustrated and anxious during dialysis 9/2 unsure if pain contributing to issues  during dialysis but have increased Neurontin from HS to BID and have increased frequency of Oxy IR **9/02: LUE AVG appears occluded- no blood return w/ attempts to dialyze so unable to have HD today-Nephro suspects may need [facility] 06/28/2021: We will defer to the nephrology team.  BMP done earlier today revealed potassium of 5.9.  Lokelma 10 g twice daily for 2 doses ordered.  Repeat renal panel in the morning.  Low-grade fever.   Noted on 9/1 without recurrence as of 9/2. Blood cultures pending given high risk for bacteremia 9/2 request CBC with differential to be obtained when patient goes to dialysis this afternoon.  Plan 06/28/2021: Temperature has ranged from 97.6 to 98.9 F over the last 24 hours.  No documented fever.  Hyperkalemia/hypophosphatemia Management per nephrology HD -supplementation as needed 06/28/2021: See above documentation.  Repeat CBC in the morning.  Persistent hyperkalemia may be multifactorial, including but not limited to ESRD and GI bleed (possibly occult).   Hypotension Blood pressure currently is stable on midodrine.   Failure to thrive /Generalized deconditioning  /  very poor oral intake PEG tube was desired by family but could not be placed because of overlying transverse colon.   Tube feeding on hold as above in favor of attempting to feed orally with Glucerna shakes supplement Discussed with general surgery on 8/31 and if patient can tolerate dialysis in chair and plan is to discuss possible open G-tube placed  Constipation No further abdominal pain and recent films without evidence of constipation Patient was on chronic narcotics prior to admission and these remain in place Patient was on 40 g Chronulac daily prn.  Continue scheduled Chronulac 20 g daily and presentation with rectal ulcers likely secondary to constipation chronic  Type 2 diabetes mellitus Episodes of hypoglycemia Hemoglobin A1c 5.1.  Diet controlled at baseline 06/28/2021: Episodes of hypoglycemia  noted.  Hold insulin for now.  Recheck blood sugar in the next 1 hour, then every 2 hours x4 hours and if stable, continue at every 4 hours.  Chronic thrombus left upper extremity Continue Eliquis Elevate as appropriate  Stage II mid coccyx pressure injury, present on admission Continue wound care    GERD Increased frequency of Protonix to BID sitter adding Pepcid Data Reviewed: Basic Metabolic Panel: Recent Labs  Lab 06/22/21 1742 06/23/21 0723 06/23/21 1036 06/23/21 1048 06/25/21 1241 06/27/21 1332 06/28/21 0249  NA 133* 131*  --  133* 128* 133* 132*  K 6.6* 6.7* 3.1* 3.5 5.7* 6.3* 5.9*  CL 97* 93*  --  93* 92* 94* 95*  CO2 28 32  --   --  '27 28 26  '$ GLUCOSE 94 99  --  113* 99 110* 82  BUN 41* 47*  --  20 36* 36* 28*  CREATININE 3.82* 4.08*  --  1.90* 3.97* 4.00* 3.40*  CALCIUM 7.8* 7.9*  --   --  7.9* 8.1* 8.0*  PHOS 2.6  --   --   --  3.7 2.7 2.7    Liver Function Tests: Recent Labs  Lab 06/22/21 1742 06/25/21 1241 06/27/21 1332 06/28/21 0249  ALBUMIN 1.8* 1.7* 1.9* 1.9*    No results for input(s): LIPASE, AMYLASE in the last 168 hours. No results for input(s): AMMONIA in the last 168 hours. CBC: Recent Labs  Lab 06/22/21 1742 06/23/21 0723 06/23/21 1048 06/27/21 1332  WBC 11.8*  --   --  11.4*  NEUTROABS  --   --   --  8.9*  HGB 6.8* 8.4* 10.2* 9.4*  HCT 21.7* 26.0* 30.0* 31.0*  MCV 95.6  --   --  97.5  PLT 245  --   --  420*    Cardiac Enzymes: No results for input(s): CKTOTAL, CKMB, CKMBINDEX, TROPONINI in the last 168 hours. BNP (last 3 results) No results for input(s): BNP in the last 8760 hours.  ProBNP (last 3 results) No results for input(s): PROBNP in the last 8760 hours.  CBG: Recent Labs  Lab 06/28/21 0357 06/28/21 0715 06/28/21 0739 06/28/21 0811 06/28/21 1137  GLUCAP 80 56* 189* 74 89     Recent Results (from the past 240 hour(s))  Culture, blood (routine x 2)     Status: None (Preliminary result)   Collection Time:  06/26/21 12:22 PM   Specimen: BLOOD  Result Value Ref Range Status   Specimen Description BLOOD RIGHT ANTECUBITAL  Final   Special Requests   Final    BOTTLES DRAWN AEROBIC AND ANAEROBIC Blood Culture adequate volume   Culture   Final    NO GROWTH 2 DAYS Performed at [facility]  [facility], [place] [address], [place], [place] [address]    Report Status PENDING  Incomplete  Culture, blood (routine x 2)     Status: None (Preliminary result)   Collection Time: 06/26/21 12:34 PM   Specimen: BLOOD  Result Value Ref Range Status   Specimen Description BLOOD RIGHT ANTECUBITAL  Final   Special Requests   Final    BOTTLES DRAWN AEROBIC AND ANAEROBIC Blood Culture adequate volume   Culture   Final    NO GROWTH 2 DAYS Performed at [facility], [place] [address], [place], [place] [address]    Report Status PENDING  Incomplete     Studies: IR Fluoro Guide CV Line Left  Result Date: 06/27/2021 INDICATION: 75 year old female with end-stage renal disease on hemodialysis via a left arm arteriovenous graft which was placed in January of this year. 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 Additionally, she is no longer able to walk and is now bed ridden with a large decubitus ulceration. Her left upper extremity arteriovenous graft could not be accessed at hemodialysis today. Bedside evaluation and ultrasound reveals that the graft is completely thrombosed. Unfortunately, due to the contraction ring of her left arm, suitable access to the AV graft can not be obtained to facilitate declotting. Therefore, we will proceed with placement of a temporary hemodialysis catheter (the patient is on Eliquis). This can be converted next week to a tunneled hemodialysis catheter after holding the Eliquis for 48 hours. EXAM: IR ULTRASOUND GUIDANCE VASC ACCESS LEFT; IR LEFT FLUORO GUIDE CV LINE MEDICATIONS: None.  ANESTHESIA/SEDATION: None. FLUOROSCOPY TIME:  - COMPLICATIONS: None immediate. PROCEDURE: Informed written consent was obtained from the patient after a thorough discussion of the procedural risks, benefits and alternatives. All questions were addressed. Maximal Sterile Barrier Technique was utilized including caps, mask, sterile gowns, sterile gloves, sterile drape, hand hygiene and skin antiseptic. A timeout was performed prior to the initiation of the procedure. The left internal jugular vein was interrogated with ultrasound and found to be widely patent. An image was obtained and stored for the medical record. Local anesthesia was attained by infiltration with 1% lidocaine. A small dermatotomy was made. Under real-time sonographic guidance, the vessel was punctured with a 21 gauge micropuncture needle. Using standard technique, the initial micro needle was exchanged over a 0.018 micro wire for a transitional 4 [place] micro sheath. The micro sheath was then exchanged over a 0.035 wire for a fascial dilator and the soft tissue tract was dilated. A 24 cm triple-lumen hemodialysis catheter was then advanced over the wire and position with the tip in the right atrium. All 3 lumens of the catheter flushes and aspirates easily. The were flushed with saline and then heparinized saline. The catheter was secured to the skin with 0 Prolene suture. The catheter was capped and sterile bandages were placed. IMPRESSION: 1. Thrombosed left upper extremity arteriovenous graft. Unfortunately, due to contractures of the left arm, declot is not possible. 2. Placement of a temporary left IJ triple-lumen hemodialysis catheter. The catheter tips are in the right atrium and ready for immediate use. Tentative plan for the patient to hold Eliquis for 48 hours next week prior to conversion of temporary catheter to a tunneled hemodialysis catheter. Electronically Signed   By: Jacqulynn Cadet M.D.   On: 06/27/2021 16:52   IR US Guide  Vasc Access Left  Result  Date: 06/27/2021 INDICATION: 75 year old female with end-stage renal disease on hemodialysis via a left arm arteriovenous graft which was placed in January of this year. 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 Additionally, she is no longer able to walk and is now bed ridden with a large decubitus ulceration. Her left upper extremity arteriovenous graft could not be accessed at hemodialysis today. Bedside evaluation and ultrasound reveals that the graft is completely thrombosed. Unfortunately, due to the contraction ring of her left arm, suitable access to the AV graft can not be obtained to facilitate declotting. Therefore, we will proceed with placement of a temporary hemodialysis catheter (the patient is on Eliquis). This can be converted next week to a tunneled hemodialysis catheter after holding the Eliquis for 48 hours. EXAM: IR ULTRASOUND GUIDANCE VASC ACCESS LEFT; IR LEFT FLUORO GUIDE CV LINE MEDICATIONS: None. ANESTHESIA/SEDATION: None. FLUOROSCOPY TIME:  - COMPLICATIONS: None immediate. PROCEDURE: Informed written consent was obtained from the patient after a thorough discussion of the procedural risks, benefits and alternatives. All questions were addressed. Maximal Sterile Barrier Technique was utilized including caps, mask, sterile gowns, sterile gloves, sterile drape, hand hygiene and skin antiseptic. A timeout was performed prior to the initiation of the procedure. The left internal jugular vein was interrogated with ultrasound and found to be widely patent. An image was obtained and stored for the medical record. Local anesthesia was attained by infiltration with 1% lidocaine. A small dermatotomy was made. Under real-time sonographic guidance, the vessel was punctured with a 21 gauge micropuncture needle. Using standard technique, the initial micro needle was  exchanged over a 0.018 micro wire for a transitional 4 [place] micro sheath. The micro sheath was then exchanged over a 0.035 wire for a fascial dilator and the soft tissue tract was dilated. A 24 cm triple-lumen hemodialysis catheter was then advanced over the wire and position with the tip in the right atrium. All 3 lumens of the catheter flushes and aspirates easily. The were flushed with saline and then heparinized saline. The catheter was secured to the skin with 0 Prolene suture. The catheter was capped and sterile bandages were placed. IMPRESSION: 1. Thrombosed left upper extremity arteriovenous graft. Unfortunately, due to contractures of the left arm, declot is not possible. 2. Placement of a temporary left IJ triple-lumen hemodialysis catheter. The catheter tips are in the right atrium and ready for immediate use. Tentative plan for the patient to hold Eliquis for 48 hours next week prior to conversion of temporary catheter to a tunneled hemodialysis catheter. Electronically Signed   By: Jacqulynn Cadet M.D.   On: 06/27/2021 16:52    Scheduled Meds:  apixaban  2.5 mg Per Tube BID   atorvastatin  40 mg Per Tube Daily   Chlorhexidine Gluconate Cloth  6 each Topical Daily   collagenase   Topical Daily   darbepoetin (ARANESP) injection - DIALYSIS  150 mcg Intravenous Q Mon-HD   dextrose  12.5 g Intravenous STAT   feeding supplement (GLUCERNA SHAKE)  237 mL Oral TID WC   feeding supplement (PROSource TF)  45 mL Per Tube Daily   gabapentin  100 mg Per Tube Q12H   insulin aspart  0-6 Units Subcutaneous Q4H   lactulose  20 g Oral Daily   metaxalone  400 mg Oral TID   midodrine  10 mg Per Tube TID WC   mirtazapine  7.5 mg Per  Tube QHS   multivitamin  1 tablet Per Tube QHS   pantoprazole sodium  40 mg Per Tube Daily   sodium chloride flush  3 mL Intravenous Q12H   thiamine  100 mg Per Tube Daily   venlafaxine  37.5 mg Per Tube BID   Continuous Infusions:  sodium chloride     iron sucrose  100 mg (06/25/21 1610)    Principal Problem:   GI bleed Active Problems:   Essential hypertension   Type 2 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, with long-term current use of insulin (HCC)   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   ESRD on dialysis ([place])   Dyslipidemia   Hypokalemia   Stage 2 skin ulcer of sacral region [facility])   Cerebral thrombosis with cerebral infarction   Protein-calorie malnutrition, severe   Acute GI bleeding   Oropharyngeal dysphagia   Failure to thrive in adult   Inadequate oral intake   Clogged feeding tube   Fever   Consultants: Vascular surgery Nephrology Gastroenterology Neurology Palliative medicine General surgery  Procedures: EEG Echocardiogram Cortrack EGD  Antibiotics: Ceftriaxone 8/3 through 8/5   Time spent: 35 minutes    Bonnell Public ANP  [facility] Please refer to Amion for contact info 39  days

## 2021-06-28 NOTE — Progress Notes (Addendum)
[facility] Progress Note   Subjective:  Seen in room - awake but not responding verbally to me today. AVG found to be clotted yesterday, IR consulted and had non-tunneled HD placed in L IJ then dialyzed yesterday evening with 2L net UF. IR planning to convert to tunneled HD line next week - need to call 1st thing on Monday regarding timing.  Objective Vitals:   06/27/21 2347 06/28/21 0111 06/28/21 0431 06/28/21 1002  BP: 136/81 94/73 (!) 95/57 108/66  Pulse:  (!) 120 78 (!) 102  Resp:  18 19 16   Temp:  98.9 F (37.2 C) 98.8 F (37.1 C) 97.8 F (36.6 C)  TempSrc:  Oral Oral Oral  SpO2:  97% (!) 89% 91%  Weight:      Height:       Physical Exam General: Chronically ill woman, NAD. NG tube in place Heart: RRR; no murmur Lungs: CTA anteriorly Abdomen: soft Extremities: 3+ LUE edema, 1+ RUE edema Dialysis Access: non-tunneled HD line in L chest  Additional Objective Labs: Basic Metabolic Panel: Recent Labs  Lab 06/25/21 1241 06/27/21 1332 06/28/21 0249  NA 128* 133* 132*  K 5.7* 6.3* 5.9*  CL 92* 94* 95*  CO2 27 28 26   GLUCOSE 99 110* 82  BUN 36* 36* 28*  CREATININE 3.97* 4.00* 3.40*  CALCIUM 7.9* 8.1* 8.0*  PHOS 3.7 2.7 2.7   Liver Function Tests: Recent Labs  Lab 06/25/21 1241 06/27/21 1332 06/28/21 0249  ALBUMIN 1.7* 1.9* 1.9*   CBC: Recent Labs  Lab 06/22/21 1742 06/23/21 0723 06/23/21 1048 06/27/21 1332  WBC 11.8*  --   --  11.4*  NEUTROABS  --   --   --  8.9*  HGB 6.8* 8.4* 10.2* 9.4*  HCT 21.7* 26.0* 30.0* 31.0*  MCV 95.6  --   --  97.5  PLT 245  --   --  420*   Studies/Results: IR Fluoro Guide CV Line Left  Result Date: 06/27/2021 INDICATION: 75 year old female with end-stage renal disease on hemodialysis via a left arm arteriovenous graft which was placed in January of this year. 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 Additionally, she is no longer able to walk and is now bed ridden with a large decubitus ulceration. Her left upper extremity arteriovenous graft could not be accessed at hemodialysis today. Bedside evaluation and ultrasound reveals that the graft is completely thrombosed. Unfortunately, due to the contraction ring of her left arm, suitable access to the AV graft can not be obtained to facilitate declotting. Therefore, we will proceed with placement of a temporary hemodialysis catheter (the patient is on Eliquis). This can be converted next week to a tunneled hemodialysis catheter after holding the Eliquis for 48 hours. EXAM: IR ULTRASOUND GUIDANCE VASC ACCESS LEFT; IR LEFT FLUORO GUIDE CV LINE MEDICATIONS: None. ANESTHESIA/SEDATION: None. FLUOROSCOPY TIME:  - COMPLICATIONS: None immediate. PROCEDURE: Informed written consent was obtained from the patient after a thorough discussion of the procedural risks, benefits and alternatives. All questions were addressed. Maximal Sterile Barrier Technique was utilized including caps, mask, sterile gowns, sterile gloves, sterile drape, hand hygiene and skin antiseptic. A timeout was performed prior to the initiation of the procedure. The left internal jugular vein was interrogated with ultrasound and found to be widely patent. An image was obtained and stored for the medical record. Local anesthesia was attained by infiltration with 1%  lidocaine. A small dermatotomy was made. Under real-time sonographic guidance, the vessel was punctured with a 21 gauge micropuncture needle. Using standard technique, the initial micro needle was exchanged over a 0.018 micro wire for a transitional 4 [place] micro sheath. The micro sheath was then exchanged over a 0.035 wire for a fascial dilator and the soft tissue tract was dilated. A 24 cm triple-lumen hemodialysis catheter was then advanced over the wire and position with the tip in the right atrium. All 3 lumens of the catheter  flushes and aspirates easily. The were flushed with saline and then heparinized saline. The catheter was secured to the skin with 0 Prolene suture. The catheter was capped and sterile bandages were placed. IMPRESSION: 1. Thrombosed left upper extremity arteriovenous graft. Unfortunately, due to contractures of the left arm, declot is not possible. 2. Placement of a temporary left IJ triple-lumen hemodialysis catheter. The catheter tips are in the right atrium and ready for immediate use. Tentative plan for the patient to hold Eliquis for 48 hours next week prior to conversion of temporary catheter to a tunneled hemodialysis catheter. Electronically Signed   By: Jacqulynn Cadet M.D.   On: 06/27/2021 16:52   IR US Guide Vasc Access Left  Result Date: 06/27/2021 INDICATION: 75 year old female with end-stage renal disease on hemodialysis via a left arm arteriovenous graft which was placed in January of this year. 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 Additionally, she is no longer able to walk and is now bed ridden with a large decubitus ulceration. Her left upper extremity arteriovenous graft could not be accessed at hemodialysis today. Bedside evaluation and ultrasound reveals that the graft is completely thrombosed. Unfortunately, due to the contraction ring of her left arm, suitable access to the AV graft can not be obtained to facilitate declotting. Therefore, we will proceed with placement of a temporary hemodialysis catheter (the patient is on Eliquis). This can be converted next week to a tunneled hemodialysis catheter after holding the Eliquis for 48 hours. EXAM: IR ULTRASOUND GUIDANCE VASC ACCESS LEFT; IR LEFT FLUORO GUIDE CV LINE MEDICATIONS: None. ANESTHESIA/SEDATION: None. FLUOROSCOPY TIME:  - COMPLICATIONS: None immediate. PROCEDURE: Informed written consent was obtained from the patient  after a thorough discussion of the procedural risks, benefits and alternatives. All questions were addressed. Maximal Sterile Barrier Technique was utilized including caps, mask, sterile gowns, sterile gloves, sterile drape, hand hygiene and skin antiseptic. A timeout was performed prior to the initiation of the procedure. The left internal jugular vein was interrogated with ultrasound and found to be widely patent. An image was obtained and stored for the medical record. Local anesthesia was attained by infiltration with 1% lidocaine. A small dermatotomy was made. Under real-time sonographic guidance, the vessel was punctured with a 21 gauge micropuncture needle. Using standard technique, the initial micro needle was exchanged over a 0.018 micro wire for a transitional 4 [place] micro sheath. The micro sheath was then exchanged over a 0.035 wire for a fascial dilator and the soft tissue tract was dilated. A 24 cm triple-lumen hemodialysis catheter was then advanced over the wire and position with the tip in the right atrium. All 3 lumens of the catheter flushes and aspirates easily. The were flushed with saline and then heparinized saline. The catheter was secured to the skin with 0 Prolene suture. The catheter was capped and sterile bandages were placed. IMPRESSION: 1.  Thrombosed left upper extremity arteriovenous graft. Unfortunately, due to contractures of the left arm, declot is not possible. 2. Placement of a temporary left IJ triple-lumen hemodialysis catheter. The catheter tips are in the right atrium and ready for immediate use. Tentative plan for the patient to hold Eliquis for 48 hours next week prior to conversion of temporary catheter to a tunneled hemodialysis catheter. Electronically Signed   By: Jacqulynn Cadet M.D.   On: 06/27/2021 16:52    Medications:  sodium chloride     iron sucrose 100 mg (06/25/21 1610)    apixaban  2.5 mg Per Tube BID   atorvastatin  40 mg Per Tube Daily    Chlorhexidine Gluconate Cloth  6 each Topical Daily   collagenase   Topical Daily   darbepoetin (ARANESP) injection - DIALYSIS  150 mcg Intravenous Q Mon-HD   dextrose  12.5 g Intravenous STAT   feeding supplement (GLUCERNA SHAKE)  237 mL Oral TID WC   feeding supplement (PROSource TF)  45 mL Per Tube Daily   gabapentin  100 mg Per Tube Q12H   heparin sodium (porcine)       insulin aspart  0-6 Units Subcutaneous Q4H   lactulose  20 g Oral Daily   metaxalone  400 mg Oral TID   midodrine  10 mg Per Tube TID WC   mirtazapine  7.5 mg Per Tube QHS   multivitamin  1 tablet Per Tube QHS   pantoprazole sodium  40 mg Per Tube Daily   sodium chloride flush  3 mL Intravenous Q12H   thiamine  100 mg Per Tube Daily   venlafaxine  37.5 mg Per Tube BID    Dialysis Orders: AF MWF  3h 37mn 400/500    62.5kg    3K/2.5 Ca    P2  AVG HeRO   Hep none - Mircera 50 q 2 wks  (7/13)  Venofer 50 q wk - Parsabiv 550mIV q HD   Assessment/Plan: AMS/FTT: Providers and palliative care met w/ family and discussed pt's decline. Due to declining mental and physical status we don't anticipate she will be able to do OP dialysis.  Pt will have to be able to sit in a chair for 4-6 hrs to qualify for OP HD w/ our group. If she is able to sit for dialysis and cooperate, then the next step would to re-CLIP the patient as her OP HD unit will not take her back.  Melena/Rectal bleeding - GI consulted. Hx rectal ulcers.  EGD 7/27 normal, flex sig with mucosal ulceration c/w sterocoral ulcers, non bleeding. Hgb 9.4, holding. ESRD: Continue HD per MWF schedule - next HD 9/5. Access Issues/AVG clotted: Had f'gram 6/23 which showed chronic thrombus and was started on Eliquis. AVG clotted 9/2, unable to be declotted d/t arm contracture. S/p non-tunneled HD line on 9/2 - plan to convert to tunneled line next week after holding Eliquis. Hypertension/volume: BP high today, on midodrine 1025mID with significant overload on exam. Need to  ^ UFG with HD as tolerated, can give IV albumin prn. Nutrition: Albumin 1.7. Getting TFs via NG tube. 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  Anemia: Hgb 10.2, continue Aranesp 150m37m Monday. S/p 2 of 3 doses IV iron. Dispo: DNR/DNI. Poor prognosis. Palliative care has seen see #1. Appreciate their involvement. We do not belive dialysis is adding to her quality of life. Per family communication  family wishes to continue. Have asked primary team that family members  sit with patient during HD to see the reality of situation and to hopefully console her as she cries entire treatment, asking for help.   ADDENDUM (2:57p): K 5.9 - Primary team added Lokelma 10g BID - appreciate this. Changed Glucerna to Nepro (lower K formula). Repeat in AM, may have to dialyze again.    Veneta Penton, PA-C 06/28/2021, 10:21 AM  [employer]

## 2021-06-29 DIAGNOSIS — K922 Gastrointestinal hemorrhage, unspecified: Secondary | ICD-10-CM | POA: Diagnosis not present

## 2021-06-29 DIAGNOSIS — I6330 Cerebral infarction due to thrombosis of unspecified cerebral artery: Secondary | ICD-10-CM | POA: Diagnosis not present

## 2021-06-29 DIAGNOSIS — D638 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: Secondary | ICD-10-CM | POA: Diagnosis not present

## 2021-06-29 LAB — GLUCOSE, CAPILLARY
Glucose-Capillary: 104 mg/dL — ABNORMAL HIGH (ref 70–99)
Glucose-Capillary: 110 mg/dL — ABNORMAL HIGH (ref 70–99)
Glucose-Capillary: 115 mg/dL — ABNORMAL HIGH (ref 70–99)
Glucose-Capillary: 149 mg/dL — ABNORMAL HIGH (ref 70–99)
Glucose-Capillary: 65 mg/dL — ABNORMAL LOW (ref 70–99)
Glucose-Capillary: 83 mg/dL (ref 70–99)
Glucose-Capillary: 97 mg/dL (ref 70–99)

## 2021-06-29 LAB — RENAL FUNCTION PANEL
Albumin: 1.8 g/dL — ABNORMAL LOW (ref 3.5–5.0)
Anion gap: 8 (ref 5–15)
BUN: 33 mg/dL — ABNORMAL HIGH (ref 8–23)
CO2: 28 mmol/L (ref 22–32)
Calcium: 8 mg/dL — ABNORMAL LOW (ref 8.9–10.3)
Chloride: 95 mmol/L — ABNORMAL LOW (ref 98–111)
Creatinine, Ser: 3.98 mg/dL — ABNORMAL HIGH (ref 0.44–1.00)
GFR, Estimated: 11 mL/min — ABNORMAL LOW (ref >60)
Glucose, Bld: 82 mg/dL (ref 70–99)
Phosphorus: 3.3 mg/dL (ref 2.5–4.6)
Potassium: 5.2 mmol/L — ABNORMAL HIGH (ref 3.5–5.1)
Sodium: 131 mmol/L — ABNORMAL LOW (ref 135–145)

## 2021-06-29 LAB — CBC WITH DIFFERENTIAL/PLATELET
Abs Immature Granulocytes: 0.05 10*3/uL (ref 0.00–0.07)
Basophils Absolute: 0.1 10*3/uL (ref 0.0–0.1)
Basophils Relative: 0 %
Eosinophils Absolute: 0.4 10*3/uL (ref 0.0–0.5)
Eosinophils Relative: 3 %
HCT: 29.5 % — ABNORMAL LOW (ref 36.0–46.0)
Hemoglobin: 9.1 g/dL — ABNORMAL LOW (ref 12.0–15.0)
Immature Granulocytes: 0 %
Lymphocytes Relative: 11 %
Lymphs Abs: 1.5 10*3/uL (ref 0.7–4.0)
MCH: 30.3 pg (ref 26.0–34.0)
MCHC: 30.8 g/dL (ref 30.0–36.0)
MCV: 98.3 fL (ref 80.0–100.0)
Monocytes Absolute: 1.3 10*3/uL — ABNORMAL HIGH (ref 0.1–1.0)
Monocytes Relative: 10 %
Neutro Abs: 10 10*3/uL — ABNORMAL HIGH (ref 1.7–7.7)
Neutrophils Relative %: 76 %
Platelets: 307 10*3/uL (ref 150–400)
RBC: 3 MIL/uL — ABNORMAL LOW (ref 3.87–5.11)
RDW: 18.2 % — ABNORMAL HIGH (ref 11.5–15.5)
WBC: 13.2 10*3/uL — ABNORMAL HIGH (ref 4.0–10.5)
nRBC: 0.2 % (ref 0.0–0.2)

## 2021-06-29 MED ORDER — LACTULOSE 10 GM/15ML PO SOLN
20.0000 g | Freq: Every day | ORAL | Status: DC
  Administered 2021-06-30 – 2021-07-17 (×15): 20 g
  Filled 2021-06-29 (×18): qty 30

## 2021-06-29 MED ORDER — NEPRO/CARBSTEADY PO LIQD
237.0000 mL | Freq: Three times a day (TID) | ORAL | Status: DC
  Administered 2021-06-29 – 2021-06-30 (×5): 237 mL

## 2021-06-29 MED ORDER — METAXALONE 800 MG PO TABS
400.0000 mg | ORAL_TABLET | Freq: Three times a day (TID) | ORAL | Status: DC
  Administered 2021-06-29 – 2021-07-11 (×30): 400 mg
  Filled 2021-06-29 (×4): qty 0.5
  Filled 2021-06-29: qty 1
  Filled 2021-06-29 (×6): qty 0.5
  Filled 2021-06-29: qty 1
  Filled 2021-06-29: qty 0.5
  Filled 2021-06-29: qty 1
  Filled 2021-06-29 (×11): qty 0.5
  Filled 2021-06-29 (×2): qty 1
  Filled 2021-06-29 (×10): qty 0.5
  Filled 2021-06-29: qty 1
  Filled 2021-06-29 (×4): qty 0.5

## 2021-06-29 MED ORDER — SODIUM ZIRCONIUM CYCLOSILICATE 10 G PO PACK
10.0000 g | PACK | Freq: Two times a day (BID) | ORAL | Status: AC
  Administered 2021-06-29 (×2): 10 g
  Filled 2021-06-29 (×2): qty 1

## 2021-06-29 NOTE — Progress Notes (Signed)
[facility] PROGRESS NOTE  Amy [name] [id number] DOB: April 29, 1946 DOA: 05/19/2021 PCP: [name], MD  Status:Inpatient  Remains inpatient appropriate because:Unsafe d/c plan, IV treatments appropriate due to intensity of illness or inability to take PO, and Inpatient level of care appropriate due to severity of illness  Dispo: The patient is from:               Anticipated d/c is to: SNF              Patient currently is medically stable to d/c.   Difficult to place patient Yes              Barriers to discharge: Dialysis patient with markedly decreased mobility-we will need to prove tolerance of sitting in chair for hemodialysis while here as well as tolerating sitting in wheelchair for up to 1 hour x 2 to replicate transport process.  Cannot discharge and proceed with outpatient hemodialysis unless this is accomplished.  Level of care: Med-Surg  Code Status: DO NOT RESUSCITATE. Family Communication:  DVT prophylaxis: Eliquis 2.5 mg p.o. twice daily. COVID vaccination status:  06/28/2021: Patient seen alongside patient's nurse.  Poor p.o. intake.  Patient is said not to be awake enough to eat.  Concerns for possible aspiration.  I guess calorie count is on hold.  Low blood sugars noted.  Potassium of 5.9 noted.  We will continue Lokelma for today (10 g twice daily x2 doses).  Renal panel in the morning.  Monitor blood sugar closely.  Continue to assess mental status.  Further management will depend on hospital course.  06/29/2021: Patient seen.  No new changes.  Pursue disposition.  Hypoglycemia has resolved.  Blood sugar ranges from 97-149.  Patient is more interactive today.  HPI: As per prior documentation "75 y.o. female with PMH significant for ESRD on HD MWF, HTN, HLD, CVA s/p TPA 02/2021 with resultant left hemiparesis, partial blood clot upper extremity, recently hospitalized to [facility] on 03/13/2021-04/22/2021 during which patient underwent flexible sigmoidoscopy  on 03/26/2021 which showed multiple rectal ulcers likely secondary to trauma from enemas and constipation.  Patient presented to the ED on 7/25 with dark stool.  On presentation, hemoglobin was 9.6; GI was consulted.  She underwent EGD which was normal and flexible sigmoidoscopy which showed nonbleeding distal rectal ulcers/stercoral ulcers.  Subsequently, GI signed off.  Neurology was also consulted for altered mental status; MRI brain was similar to prior MRI at [facility] with a large right MCA stroke.   Nephrology has been following for dialysis needs; nephrology is stating that patient will not be accepted back to outpatient dialysis at this time due to her underlying confusion, weakness and inability to sit in a dialysis recliner as an outpatient.   Due to her poor oral intake, family wanted a PEG tube placement.  IR tried but was unable to place because of overlying transverse colon.   General surgery was consulted for G-tube placement but family currently undecided.  She has an NG tube in place.   Palliative care following for goals of care discussion".  Subjective: The patient is awake, but unable to give significant history.    Objective: Vitals:   06/29/21 0910 06/29/21 1648  BP: 130/66 127/86  Pulse: (!) 102 (!) 103  Resp: 18 18  Temp: 98.6 F (37 C) 98.4 F (36.9 C)  SpO2: 96% 95%    Intake/Output Summary (Last 24 hours) at 06/29/2021 1914 Last data filed at 06/29/2021 1700 Gross per 24  hour  Intake 0 ml  Output 0 ml  Net 0 ml    Filed Weights   06/18/21 1610 06/18/21 1925  Weight: 62 kg 60 kg    Exam:  Constitutional: Chronically ill looking.  Not in any distress.  Contracture of the left upper extremity.  No significant history from patient.  Patient has NG tube in place. HEENT: Patient is pale.  No jaundice. Respiratory: Clear to auscultation.  U Cardiovascular: S1-S2.   Abdomen: Soft and nontender.   Neurologic: Patient is awake.  No significant history from the  patient.  Contracture of the left upper extremity.  Patient moves right upper extremity.  Patient will not comply very well with full neurological examination.    Assessment/Plan: Acute problems: Acute on chronic GI bleeding from stercoral ulcers -Patient presented with anemia,  underwent EGD which was normal and flex sigmoidoscopy with clean base, nonbleeding distal rectal ulcers (stercoral ulcers). Gastroenterology now signed off  -Hemoglobin stable (7-8). 06/28/2021: Last CBC done on 06/27/2021 revealed hemoglobin of 9.4 g/dL.   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 with no drastic changes EEG with no seizure activity or epileptiform discharges.  Merrily consistent with encephalopathy Lipid panel and hemoglobin A1c within normal limits.  Continue Lipitor Diet as per SLP recommendations.  Extremely poor to no oral intake Cortrak tube in place for enteral feedings **As of 9/2 patient requesting to eat so will stop continuous tube feedings and encourage diet.  Staff aware that if need to change to regular unrestricted diet can do so to improve intake.  Also give 1 can Glucerna after meals TID per tube and begin calorie count** Discussed with surgical team.  Patient needs to prove tolerance of HD in chair before consideration for open G-tube noting this would be a high risk procedure.    ESRD on HD Per Nephro Patient is not a candidate for continued outpatient HD given her confusion, weakness and inability to sit up in a chair for HD outpatient.  Nephrology suggested transition to hospice care but family resistant.  Palliative care attempted to meet in person with son on 8/31 but he was a no-show.  She notified him by telephone and he insisted that as long as his mother could speak that she would remain full aggressive care.[name] Physically patient appears to be tolerating HD in  chair although emotionally she becomes frustrated and anxious during dialysis 9/2 unsure if pain contributing to issues during dialysis but have increased Neurontin from HS to BID and have increased frequency of Oxy IR **9/02: LUE AVG appears occluded- no blood return w/ attempts to dialyze so unable to have HD today-Nephro suspects may need [facility] 06/28/2021: We will defer to the nephrology team.  BMP done earlier today revealed potassium of 5.9.  Lokelma 10 g twice daily for 2 doses ordered.  Repeat renal panel in the morning.  Low-grade fever.   Noted on 9/1 without recurrence as of 9/2. Blood cultures pending given high risk for bacteremia 9/2 request CBC with differential to be obtained when patient goes to dialysis this afternoon.  Plan 06/28/2021: Temperature has ranged from 97.6 to 98.9 F over the last 24 hours.  No documented fever.  Hyperkalemia/hypophosphatemia Management per nephrology HD -supplementation as needed 06/28/2021: See above documentation.  Repeat CBC in the morning.  Persistent hyperkalemia may be multifactorial, including but not limited to ESRD and GI  bleed (possibly occult).   Hypotension Blood pressure currently is stable on midodrine.   Failure to thrive /Generalized deconditioning  /very poor oral intake PEG tube was desired by family but could not be placed because of overlying transverse colon.   Tube feeding on hold as above in favor of attempting to feed orally with Glucerna shakes supplement Discussed with general surgery on 8/31 and if patient can tolerate dialysis in chair and plan is to discuss possible open G-tube placed  Constipation No further abdominal pain and recent films without evidence of constipation Patient was on chronic narcotics prior to admission and these remain in place Patient was on 40 g Chronulac daily prn.  Continue scheduled Chronulac 20 g daily and presentation with rectal ulcers likely secondary to constipation chronic  Type 2 diabetes  mellitus Episodes of hypoglycemia Hemoglobin A1c 5.1.  Diet controlled at baseline 06/28/2021: Episodes of hypoglycemia noted.  Hold insulin for now.  Recheck blood sugar in the next 1 hour, then every 2 hours x4 hours and if stable, continue at every 4 hours.  Chronic thrombus left upper extremity Continue Eliquis Elevate as appropriate  Stage II mid coccyx pressure injury, present on admission Continue wound care    GERD Increased frequency of Protonix to BID sitter adding Pepcid Data Reviewed: Basic Metabolic Panel: Recent Labs  Lab 06/23/21 0723 06/23/21 1036 06/23/21 1048 06/25/21 1241 06/27/21 1332 06/28/21 0249 06/29/21 0140  NA 131*  --  133* 128* 133* 132* 131*  K 6.7*   < > 3.5 5.7* 6.3* 5.9* 5.2*  CL 93*  --  93* 92* 94* 95* 95*  CO2 32  --   --  '27 28 26 28  '$ GLUCOSE 99  --  113* 99 110* 82 82  BUN 47*  --  20 36* 36* 28* 33*  CREATININE 4.08*  --  1.90* 3.97* 4.00* 3.40* 3.98*  CALCIUM 7.9*  --   --  7.9* 8.1* 8.0* 8.0*  PHOS  --   --   --  3.7 2.7 2.7 3.3   < > = values in this interval not displayed.    Liver Function Tests: Recent Labs  Lab 06/25/21 1241 06/27/21 1332 06/28/21 0249 06/29/21 0140  ALBUMIN 1.7* 1.9* 1.9* 1.8*    No results for input(s): LIPASE, AMYLASE in the last 168 hours. No results for input(s): AMMONIA in the last 168 hours. CBC: Recent Labs  Lab 06/23/21 0723 06/23/21 1048 06/27/21 1332 06/29/21 0140  WBC  --   --  11.4* 13.2*  NEUTROABS  --   --  8.9* 10.0*  HGB 8.4* 10.2* 9.4* 9.1*  HCT 26.0* 30.0* 31.0* 29.5*  MCV  --   --  97.5 98.3  PLT  --   --  420* 307    Cardiac Enzymes: No results for input(s): CKTOTAL, CKMB, CKMBINDEX, TROPONINI in the last 168 hours. BNP (last 3 results) No results for input(s): BNP in the last 8760 hours.  ProBNP (last 3 results) No results for input(s): PROBNP in the last 8760 hours.  CBG: Recent Labs  Lab 06/29/21 0205 06/29/21 0450 06/29/21 0751 06/29/21 1149 06/29/21 1647   GLUCAP 83 110* 97 115* 149*     Recent Results (from the past 240 hour(s))  Culture, blood (routine x 2)     Status: None (Preliminary result)   Collection Time: 06/26/21 12:22 PM   Specimen: BLOOD  Result Value Ref Range Status   Specimen Description BLOOD RIGHT ANTECUBITAL  Final  Special Requests   Final    BOTTLES DRAWN AEROBIC AND ANAEROBIC Blood Culture adequate volume   Culture   Final    NO GROWTH 3 DAYS Performed at [facility], [place] [address] [place], [place] [address]    Report Status PENDING  Incomplete  Culture, blood (routine x 2)     Status: None (Preliminary result)   Collection Time: 06/26/21 12:34 PM   Specimen: BLOOD  Result Value Ref Range Status   Specimen Description BLOOD RIGHT ANTECUBITAL  Final   Special Requests   Final    BOTTLES DRAWN AEROBIC AND ANAEROBIC Blood Culture adequate volume   Culture   Final    NO GROWTH 3 DAYS Performed at [facility], [place] [address], [place], [place] [address]    Report Status PENDING  Incomplete     Studies: No results found.  Scheduled Meds:  apixaban  2.5 mg Per Tube BID   atorvastatin  40 mg Per Tube Daily   Chlorhexidine Gluconate Cloth  6 each Topical Daily   collagenase   Topical Daily   darbepoetin (ARANESP) injection - DIALYSIS  150 mcg Intravenous Q Mon-HD   feeding supplement (NEPRO CARB STEADY)  237 mL Per Tube TID WC   feeding supplement (PROSource TF)  45 mL Per Tube Daily   gabapentin  100 mg Per Tube Q12H   insulin aspart  0-6 Units Subcutaneous Q4H   [START ON 06/30/2021] lactulose  20 g Per Tube Daily   metaxalone  400 mg Per Tube TID   midodrine  10 mg Per Tube TID WC   mirtazapine  7.5 mg Per Tube QHS   multivitamin  1 tablet Per Tube QHS   pantoprazole sodium  40 mg Per Tube Daily   sodium chloride flush  3 mL Intravenous Q12H   sodium zirconium cyclosilicate  10 g Per Tube BID   thiamine  100 mg Per Tube Daily   venlafaxine  37.5 mg Per Tube BID   Continuous  Infusions:  sodium chloride     iron sucrose 100 mg (06/25/21 1610)    Principal Problem:   GI bleed Active Problems:   Essential hypertension   Type 2 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, with long-term current use of insulin (HCC)   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   ESRD on dialysis ([place])   Dyslipidemia   Hypokalemia   Stage 2 skin ulcer of sacral region ([place])   Cerebral thrombosis with cerebral infarction   Protein-calorie malnutrition, severe   Acute GI bleeding   Oropharyngeal dysphagia   Failure to thrive in adult   Inadequate oral intake   Clogged feeding tube   Fever   Consultants: Vascular surgery Nephrology Gastroenterology Neurology Palliative medicine General surgery  Procedures: EEG Echocardiogram Cortrack EGD  Antibiotics: Ceftriaxone 8/3 through 8/5   Time spent: 25 minutes    Bonnell Public ANP  [facility] Please refer to Amion for contact info 40  days

## 2021-06-29 NOTE — Progress Notes (Signed)
[facility] Progress Note   Subjective:  Seen in room - more awake this morning and able to answer my questions. Denies CP or dyspnea. Still with significant edema.  Objective Vitals:   06/28/21 2212 06/29/21 0500 06/29/21 0503 06/29/21 0910  BP: 108/80 114/69 137/72 130/66  Pulse: 87 (!) 108 (!) 110 (!) 102  Resp: 18 14 14 18   Temp: 98.2 F (36.8 C) 98.3 F (36.8 C) 98.6 F (37 C) 98.6 F (37 C)  TempSrc:  Oral    SpO2: 92% 95% 94% 96%  Weight:      Height:       Physical Exam General: Chronically ill woman, NAD. NG tube in place Heart: RRR; no murmur Lungs: scattered rhonchi anteriorly Abdomen: soft Extremities: 3+ BUE edema; 1+ flank edema Dialysis Access: non-tunneled HD line in L chest    Additional Objective Labs: Basic Metabolic Panel: Recent Labs  Lab 06/27/21 1332 06/28/21 0249 06/29/21 0140  NA 133* 132* 131*  K 6.3* 5.9* 5.2*  CL 94* 95* 95*  CO2 28 26 28   GLUCOSE 110* 82 82  BUN 36* 28* 33*  CREATININE 4.00* 3.40* 3.98*  CALCIUM 8.1* 8.0* 8.0*  PHOS 2.7 2.7 3.3   Liver Function Tests: Recent Labs  Lab 06/27/21 1332 06/28/21 0249 06/29/21 0140  ALBUMIN 1.9* 1.9* 1.8*   CBC: Recent Labs  Lab 06/22/21 1742 06/23/21 0723 06/23/21 1048 06/27/21 1332 06/29/21 0140  WBC 11.8*  --   --  11.4* 13.2*  NEUTROABS  --   --   --  8.9* 10.0*  HGB 6.8*   < > 10.2* 9.4* 9.1*  HCT 21.7*   < > 30.0* 31.0* 29.5*  MCV 95.6  --   --  97.5 98.3  PLT 245  --   --  420* 307   < > = values in this interval not displayed.   Studies/Results: IR Fluoro Guide CV Line Left  Result Date: 06/27/2021 INDICATION: 75 year old female with end-stage renal disease on hemodialysis via a left arm arteriovenous graft which was placed in January of this year. 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 Additionally, she is no longer able to walk and  is now bed ridden with a large decubitus ulceration. Her left upper extremity arteriovenous graft could not be accessed at hemodialysis today. Bedside evaluation and ultrasound reveals that the graft is completely thrombosed. Unfortunately, due to the contraction ring of her left arm, suitable access to the AV graft can not be obtained to facilitate declotting. Therefore, we will proceed with placement of a temporary hemodialysis catheter (the patient is on Eliquis). This can be converted next week to a tunneled hemodialysis catheter after holding the Eliquis for 48 hours. EXAM: IR ULTRASOUND GUIDANCE VASC ACCESS LEFT; IR LEFT FLUORO GUIDE CV LINE MEDICATIONS: None. ANESTHESIA/SEDATION: None. FLUOROSCOPY TIME:  - COMPLICATIONS: None immediate. PROCEDURE: Informed written consent was obtained from the patient after a thorough discussion of the procedural risks, benefits and alternatives. All questions were addressed. Maximal Sterile Barrier Technique was utilized including caps, mask, sterile gowns, sterile gloves, sterile drape, hand hygiene and skin antiseptic. A timeout was performed prior to the initiation of the procedure. The left internal jugular vein was interrogated with ultrasound and found to be widely patent. An image was obtained and stored for the medical record. Local anesthesia was attained by infiltration with 1% lidocaine. A small dermatotomy  was made. Under real-time sonographic guidance, the vessel was punctured with a 21 gauge micropuncture needle. Using standard technique, the initial micro needle was exchanged over a 0.018 micro wire for a transitional 4 [place] micro sheath. The micro sheath was then exchanged over a 0.035 wire for a fascial dilator and the soft tissue tract was dilated. A 24 cm triple-lumen hemodialysis catheter was then advanced over the wire and position with the tip in the right atrium. All 3 lumens of the catheter flushes and aspirates easily. The were flushed with saline  and then heparinized saline. The catheter was secured to the skin with 0 Prolene suture. The catheter was capped and sterile bandages were placed. IMPRESSION: 1. Thrombosed left upper extremity arteriovenous graft. Unfortunately, due to contractures of the left arm, declot is not possible. 2. Placement of a temporary left IJ triple-lumen hemodialysis catheter. The catheter tips are in the right atrium and ready for immediate use. Tentative plan for the patient to hold Eliquis for 48 hours next week prior to conversion of temporary catheter to a tunneled hemodialysis catheter. Electronically Signed   By: Jacqulynn Cadet M.D.   On: 06/27/2021 16:52   IR US Guide Vasc Access Left  Result Date: 06/27/2021 INDICATION: 75 year old female with end-stage renal disease on hemodialysis via a left arm arteriovenous graft which was placed in January of this year. 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 Additionally, she is no longer able to walk and is now bed ridden with a large decubitus ulceration. Her left upper extremity arteriovenous graft could not be accessed at hemodialysis today. Bedside evaluation and ultrasound reveals that the graft is completely thrombosed. Unfortunately, due to the contraction ring of her left arm, suitable access to the AV graft can not be obtained to facilitate declotting. Therefore, we will proceed with placement of a temporary hemodialysis catheter (the patient is on Eliquis). This can be converted next week to a tunneled hemodialysis catheter after holding the Eliquis for 48 hours. EXAM: IR ULTRASOUND GUIDANCE VASC ACCESS LEFT; IR LEFT FLUORO GUIDE CV LINE MEDICATIONS: None. ANESTHESIA/SEDATION: None. FLUOROSCOPY TIME:  - COMPLICATIONS: None immediate. PROCEDURE: Informed written consent was obtained from the patient after a thorough discussion of the procedural risks,  benefits and alternatives. All questions were addressed. Maximal Sterile Barrier Technique was utilized including caps, mask, sterile gowns, sterile gloves, sterile drape, hand hygiene and skin antiseptic. A timeout was performed prior to the initiation of the procedure. The left internal jugular vein was interrogated with ultrasound and found to be widely patent. An image was obtained and stored for the medical record. Local anesthesia was attained by infiltration with 1% lidocaine. A small dermatotomy was made. Under real-time sonographic guidance, the vessel was punctured with a 21 gauge micropuncture needle. Using standard technique, the initial micro needle was exchanged over a 0.018 micro wire for a transitional 4 [place] micro sheath. The micro sheath was then exchanged over a 0.035 wire for a fascial dilator and the soft tissue tract was dilated. A 24 cm triple-lumen hemodialysis catheter was then advanced over the wire and position with the tip in the right atrium. All 3 lumens of the catheter flushes and aspirates easily. The were flushed with saline and then heparinized saline. The catheter was secured to the skin with 0 Prolene suture. The catheter was capped and sterile bandages were placed. IMPRESSION: 1. Thrombosed left upper extremity  arteriovenous graft. Unfortunately, due to contractures of the left arm, declot is not possible. 2. Placement of a temporary left IJ triple-lumen hemodialysis catheter. The catheter tips are in the right atrium and ready for immediate use. Tentative plan for the patient to hold Eliquis for 48 hours next week prior to conversion of temporary catheter to a tunneled hemodialysis catheter. Electronically Signed   By: Jacqulynn Cadet M.D.   On: 06/27/2021 16:52   Medications:  sodium chloride     iron sucrose 100 mg (06/25/21 1610)    apixaban  2.5 mg Per Tube BID   atorvastatin  40 mg Per Tube Daily   Chlorhexidine Gluconate Cloth  6 each Topical Daily    collagenase   Topical Daily   darbepoetin (ARANESP) injection - DIALYSIS  150 mcg Intravenous Q Mon-HD   feeding supplement (NEPRO CARB STEADY)  237 mL Oral TID WC   feeding supplement (PROSource TF)  45 mL Per Tube Daily   gabapentin  100 mg Per Tube Q12H   insulin aspart  0-6 Units Subcutaneous Q4H   lactulose  20 g Oral Daily   metaxalone  400 mg Oral TID   midodrine  10 mg Per Tube TID WC   mirtazapine  7.5 mg Per Tube QHS   multivitamin  1 tablet Per Tube QHS   pantoprazole sodium  40 mg Per Tube Daily   sodium chloride flush  3 mL Intravenous Q12H   thiamine  100 mg Per Tube Daily   venlafaxine  37.5 mg Per Tube BID    Dialysis Orders: AF MWF  3h 53mn 400/500    62.5kg    3K/2.5 Ca    P2  AVG HeRO   Hep none - Mircera 50 q 2 wks  (7/13)  Venofer 50 q wk - Parsabiv 564mIV q HD   Assessment/Plan: AMS/FTT: Providers and palliative care met w/ family and discussed pt's decline. Due to declining mental and physical status we don't anticipate she will be able to do OP dialysis.  Pt will have to be able to sit in a chair for 4-6 hrs to qualify for OP HD w/ our group. If she is able to sit for dialysis and cooperate, then the next step would to re-CLIP the patient as her OP HD unit will not take her back.  Melena/Rectal bleeding - GI consulted. Hx rectal ulcers.  EGD 7/27 normal, flex sig with mucosal ulceration c/w sterocoral ulcers, non bleeding. Hgb 9.1, trending down again. ESRD: Continue HD per MWF schedule - next HD 9/5. K 5.9 yesterday - given Lokelma x2 - down to 5.2 today, will give another 2 doses to hold to tomorrow. Access Issues/AVG clotted: Had f'gram 6/23 which showed chronic thrombus and was started on Eliquis. AVG clotted 9/2, unable to be declotted d/t arm contracture. S/p non-tunneled HD line on 9/2 - plan to convert to tunneled line next week after holding Eliquis. Will call IR in AM to confirm timing. Hypertension/volume: BP stable, on midodrine 1061mID with  significant overload on exam. Need to ^ UFG with HD as tolerated, can give IV albumin prn. Nutrition: Albumin 1.7. Getting TFs via NG tube. 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  Anemia: Hgb down to 9.1, continue Aranesp 150m23m Monday.  Dispo: DNR/DNI. Poor prognosis. Palliative care has seen see #1. Appreciate their involvement. We do not believe dialysis is adding to her quality of life. Per family communication, family wishes to continue. Have asked primary  team that family members sit with patient during HD to see the reality of situation and to hopefully console her as she cries entire treatment, asking for help.   Veneta Penton, PA-C 06/29/2021, 10:18 AM  [employer]

## 2021-06-30 DIAGNOSIS — I6330 Cerebral infarction due to thrombosis of unspecified cerebral artery: Secondary | ICD-10-CM | POA: Diagnosis not present

## 2021-06-30 DIAGNOSIS — N186 End stage renal disease: Secondary | ICD-10-CM | POA: Diagnosis not present

## 2021-06-30 DIAGNOSIS — R627 Adult failure to thrive: Secondary | ICD-10-CM | POA: Diagnosis not present

## 2021-06-30 DIAGNOSIS — R638 Other symptoms and signs concerning food and fluid intake: Secondary | ICD-10-CM | POA: Diagnosis not present

## 2021-06-30 LAB — GLUCOSE, CAPILLARY
Glucose-Capillary: 116 mg/dL — ABNORMAL HIGH (ref 70–99)
Glucose-Capillary: 72 mg/dL (ref 70–99)
Glucose-Capillary: 79 mg/dL (ref 70–99)
Glucose-Capillary: 90 mg/dL (ref 70–99)
Glucose-Capillary: 162 mg/dL — ABNORMAL HIGH (ref 70–99)
Glucose-Capillary: 158 mg/dL — ABNORMAL HIGH (ref 70–99)

## 2021-06-30 MED ORDER — SODIUM CHLORIDE 0.9% FLUSH
10.0000 mL | Freq: Two times a day (BID) | INTRAVENOUS | Status: DC
  Administered 2021-06-30 – 2021-09-05 (×32): 10 mL

## 2021-06-30 NOTE — Progress Notes (Signed)
[facility] PROGRESS NOTE  Amy [name] [id number] DOB: April 25, 1946 DOA: 05/19/2021 PCP: [name], MD  Status:Inpatient  Remains inpatient appropriate because:Unsafe d/c plan, IV treatments appropriate due to intensity of illness or inability to take PO, and Inpatient level of care appropriate due to severity of illness  Dispo: The patient is from: Home              Anticipated d/c is to: SNF              Patient currently is medically stable to d/c.   Difficult to place patient Yes              Barriers to discharge: Dialysis patient with markedly decreased mobility-we will need to prove tolerance of sitting in chair for hemodialysis while here as well as tolerating sitting in wheelchair for up to 1 hour x 2 to replicate transport process.  Cannot discharge and proceed with outpatient hemodialysis unless this is accomplished.      Level of care: Med-Surg  Code Status:  Family Communication:  DVT prophylaxis:  COVID vaccination status:    HPI: 75 y.o. female with PMH significant for ESRD on HD MWF, HTN, HLD, CVA s/p TPA 02/2021 with resultant left hemiparesis, partial blood clot upper extremity, recently hospitalized to [facility] on 03/13/2021-04/22/2021 during which patient underwent flexible sigmoidoscopy on 03/26/2021 which showed multiple rectal ulcers likely secondary to trauma from enemas and constipation.  Patient presented to the ED on 7/25 with dark stool.  On presentation, hemoglobin was 9.6; GI was consulted.  She underwent EGD which was normal and flexible sigmoidoscopy which showed nonbleeding distal rectal ulcers/stercoral ulcers.  Subsequently, GI signed off.  Neurology was also consulted for altered mental status; MRI brain was similar to prior MRI at [facility] with a large right MCA stroke.   Nephrology has been following for dialysis needs; nephrology is stating that patient will not be accepted back to outpatient dialysis at this time due to her underlying  confusion, weakness and inability to sit in a dialysis recliner as an outpatient.   Due to her poor oral intake, family wanted a PEG tube placement.  IR tried but was unable to place because of overlying transverse colon.   General surgery was consulted for G-tube placement but family currently undecided.  She has an NG tube in place.   Palliative care following for goals of care discussion.  Subjective: Patient awakened.  Continues to report intermittent issues with not being hungry.  Inform patient that in order to avoid feeding tube placement that she really does need to try to eat.  I told her she can have what ever she wanted to eat including food from home as long as she eats something.  Objective: Vitals:   06/29/21 2000 06/30/21 0518  BP: 135/70 (!) 170/76  Pulse: (!) 110 98  Resp: 16 16  Temp: 97.9 F (36.6 C) 97.6 F (36.4 C)  SpO2: 95% 95%    Intake/Output Summary (Last 24 hours) at 06/30/2021 0836 Last data filed at 06/29/2021 1700 Gross per 24 hour  Intake 0 ml  Output 0 ml  Net 0 ml   Filed Weights   06/18/21 1610 06/18/21 1925  Weight: 62 kg 60 kg    Exam:  Constitutional: NAD, calm, awakens easily Respiratory: Lungs CTA, no increased work of breathing, normal pulse oximetry.  Room air Cardiovascular: Heart sounds S1-S2, chronic LUE edema, no peripheral edema.  Normotensive with regular pulse Abdomen: no tenderness, no  masses palpated. Bowel sounds positive. LBM 9/01-cortrack for feeding Neurologic: CN 2-12 grossly intact -Strength 3/5 on the right.  With chronic left hemiparesis from prior CVA.  Continues to have left-sided neglect. Psychiatric: Awake and oriented to name and place.  More appropriate with conversational speech today despite being confused.    Assessment/Plan: Acute problems: Acute on chronic lower GI bleeding from stercoral ulcers -Patient presented with anemia,  underwent EGD which was normal and flex sigmoidoscopy with clean base,  nonbleeding distal rectal ulcers (stercoral ulcers). Gastroenterology now signed off  -Hemoglobin stable (7-8).     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  Merrily consistent with encephalopathy Lipid panel and hemoglobin A1c within normal limits.  Continue Lipitor Diet as per SLP recommendations.  Extremely poor to no oral intake Cortrak tube in place for enteral feedings  Continue 1 can Glucerna after meals TID per tube  Changed to regular diet to encourage oral intake.  Unfortunately continues with 0% intake-if does not eat anything today on 9/5 we will resume continuous tube feedings Discussed with surgical team.  Patient needs to prove tolerance of HD in chair before consideration for open G-tube noting this would be a high risk procedure.    ESRD on HD Per Nephro Patient is not a candidate for continued outpatient HD given her confusion, weakness and inability to sit up in a chair for HD outpatient.  Nephrology suggested transition to hospice care but family resistant.  PMT spoke with son by phone recently and he insisted that as long as his mother could speak that she would remain full aggressive care.[name] Physically patient appears to be tolerating HD in chair although emotionally she becomes frustrated and anxious during dialysis Continue Neurontin and Oxy IR pain contributing to some of her intolerance to HD 9/02: LUE AVG occluded R planning to convert from nontunneled IJ access to a tunneled dialysis catheter this week  Hyperkalemia/hypophosphatemia Management per nephrology HD -supplementation as needed   Hypotension Blood pressure currently is stable on midodrine.   Failure to thrive /Generalized deconditioning  /very poor oral intake PEG tube was desired by family but could  not be placed because of overlying transverse colon.   Tube feeding on hold as above in favor of attempting to feed orally with Glucerna shakes supplement; unfortunately patient continues with no oral intake.  Likely will need to resume continuous tube feedings on 9/6 Discussed with general surgery on 8/31 and if patient can tolerate dialysis in chair and plan is to discuss possible open G-tube placed  Constipation No further abdominal pain and recent films without evidence of constipation Patient was on chronic narcotics prior to admission and these remain in place Patient was on 40 g Chronulac daily prn.  Continue scheduled Chronulac 20 g daily and presentation with rectal ulcers likely secondary to constipation chronic  Type 2 diabetes mellitus Episodes of hypoglycemia Hemoglobin A1c 5.1.  Diet controlled at baseline  Chronic thrombus left upper extremity Continue Eliquis Elevate as appropriate  Stage II mid coccyx pressure injury, present on admission Continue wound care    GERD Could also be contributing to patient's poor oral intake Increased frequency of Protonix to BID sitter adding Pepcid     Data Reviewed: Basic Metabolic Panel: Recent Labs  Lab 06/23/21 1048 06/25/21 1241 06/27/21 1332 06/28/21 0249  06/29/21 0140  NA 133* 128* 133* 132* 131*  K 3.5 5.7* 6.3* 5.9* 5.2*  CL 93* 92* 94* 95* 95*  CO2  --  '27 28 26 28  '$ GLUCOSE 113* 99 110* 82 82  BUN 20 36* 36* 28* 33*  CREATININE 1.90* 3.97* 4.00* 3.40* 3.98*  CALCIUM  --  7.9* 8.1* 8.0* 8.0*  PHOS  --  3.7 2.7 2.7 3.3   Liver Function Tests: Recent Labs  Lab 06/25/21 1241 06/27/21 1332 06/28/21 0249 06/29/21 0140  ALBUMIN 1.7* 1.9* 1.9* 1.8*   No results for input(s): LIPASE, AMYLASE in the last 168 hours. No results for input(s): AMMONIA in the last 168 hours. CBC: Recent Labs  Lab 06/23/21 1048 06/27/21 1332 06/29/21 0140  WBC  --  11.4* 13.2*  NEUTROABS  --  8.9* 10.0*  HGB 10.2* 9.4* 9.1*   HCT 30.0* 31.0* 29.5*  MCV  --  97.5 98.3  PLT  --  420* 307   Cardiac Enzymes: No results for input(s): CKTOTAL, CKMB, CKMBINDEX, TROPONINI in the last 168 hours. BNP (last 3 results) No results for input(s): BNP in the last 8760 hours.  ProBNP (last 3 results) No results for input(s): PROBNP in the last 8760 hours.  CBG: Recent Labs  Lab 06/29/21 1647 06/29/21 2212 06/30/21 0107 06/30/21 0525 06/30/21 0751  GLUCAP 149* 104* 116* 72 79    Recent Results (from the past 240 hour(s))  Culture, blood (routine x 2)     Status: None (Preliminary result)   Collection Time: 06/26/21 12:22 PM   Specimen: BLOOD  Result Value Ref Range Status   Specimen Description BLOOD RIGHT ANTECUBITAL  Final   Special Requests   Final    BOTTLES DRAWN AEROBIC AND ANAEROBIC Blood Culture adequate volume   Culture   Final    NO GROWTH 4 DAYS Performed at [facility], [place] [address], [place], [place] [address]    Report Status PENDING  Incomplete  Culture, blood (routine x 2)     Status: None (Preliminary result)   Collection Time: 06/26/21 12:34 PM   Specimen: BLOOD  Result Value Ref Range Status   Specimen Description BLOOD RIGHT ANTECUBITAL  Final   Special Requests   Final    BOTTLES DRAWN AEROBIC AND ANAEROBIC Blood Culture adequate volume   Culture   Final    NO GROWTH 4 DAYS Performed at [facility], [place] [address] [place], [place] [address]    Report Status PENDING  Incomplete     Studies: No results found.  Scheduled Meds:  apixaban  2.5 mg Per Tube BID   atorvastatin  40 mg Per Tube Daily   Chlorhexidine Gluconate Cloth  6 each Topical Daily   collagenase   Topical Daily   darbepoetin (ARANESP) injection - DIALYSIS  150 mcg Intravenous Q Mon-HD   feeding supplement (NEPRO CARB STEADY)  237 mL Per Tube TID WC   feeding supplement (PROSource TF)  45 mL Per Tube Daily   gabapentin  100 mg Per Tube Q12H   insulin aspart  0-6 Units Subcutaneous Q4H    lactulose  20 g Per Tube Daily   metaxalone  400 mg Per Tube TID   midodrine  10 mg Per Tube TID WC   mirtazapine  7.5 mg Per Tube QHS   multivitamin  1 tablet Per Tube QHS   pantoprazole sodium  40 mg Per Tube Daily   sodium chloride flush  10-40 mL Intracatheter  Q12H   sodium chloride flush  3 mL Intravenous Q12H   thiamine  100 mg Per Tube Daily   venlafaxine  37.5 mg Per Tube BID   Continuous Infusions:  sodium chloride     iron sucrose 100 mg (06/25/21 1610)    Principal Problem:   GI bleed Active Problems:   Essential hypertension   Type 2 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, with long-term current use of insulin (HCC)   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   ESRD on dialysis ([place])   Dyslipidemia   Hypokalemia   Stage 2 skin ulcer of sacral region [facility])   Cerebral thrombosis with cerebral infarction   Protein-calorie malnutrition, severe   Acute GI bleeding   Oropharyngeal dysphagia   Failure to thrive in adult   Inadequate oral intake   Clogged feeding tube   Fever   Consultants: Vascular surgery Nephrology Gastroenterology Neurology Palliative medicine General surgery  Procedures: EEG Echocardiogram Cortrack EGD  Antibiotics: Ceftriaxone 8/3 through 8/5   Time spent: 45 minutes    Erin Hearing ANP  [facility] 7 am - 330 pm/M-F for direct patient care and secure chat Please refer to Amion for contact info 41  days

## 2021-06-30 NOTE — Progress Notes (Addendum)
[facility] Progress Note   Subjective:    Seen and examined at bedside. Responded to painful stimuli. Plan for HD today.  Objective Vitals:   06/29/21 2000 06/30/21 0518 06/30/21 0942 06/30/21 1225  BP: 135/70 (!) 170/76 (!) 147/85 133/88  Pulse: (!) 110 98 100   Resp: 16 16 18    Temp: 97.9 F (36.6 C) 97.6 F (36.4 C) 97.6 F (36.4 C)   TempSrc: Oral  Oral   SpO2: 95% 95% 93%   Weight:      Height:       Physical Exam General: Chronically ill woman; NAD; NGT in place. Heart: S1 and S2; No murmurs, gallops, or rubs Lungs: Clear anteriorly; No wheezing, rales, or rhonchi Abdomen: Soft and non-tender Extremities: 3+ edema BUE and 2+ BL hips Dialysis Access: L Non-tunneled catheter  Filed Weights   06/18/21 1610 06/18/21 1925  Weight: 62 kg 60 kg    Intake/Output Summary (Last 24 hours) at 06/30/2021 1335 Last data filed at 06/30/2021 1300 Gross per 24 hour  Intake 0 ml  Output 0 ml  Net 0 ml    Additional Objective Labs: Basic Metabolic Panel: Recent Labs  Lab 06/27/21 1332 06/28/21 0249 06/29/21 0140  NA 133* 132* 131*  K 6.3* 5.9* 5.2*  CL 94* 95* 95*  CO2 28 26 28   GLUCOSE 110* 82 82  BUN 36* 28* 33*  CREATININE 4.00* 3.40* 3.98*  CALCIUM 8.1* 8.0* 8.0*  PHOS 2.7 2.7 3.3   Liver Function Tests: Recent Labs  Lab 06/27/21 1332 06/28/21 0249 06/29/21 0140  ALBUMIN 1.9* 1.9* 1.8*   No results for input(s): LIPASE, AMYLASE in the last 168 hours. CBC: Recent Labs  Lab 06/27/21 1332 06/29/21 0140  WBC 11.4* 13.2*  NEUTROABS 8.9* 10.0*  HGB 9.4* 9.1*  HCT 31.0* 29.5*  MCV 97.5 98.3  PLT 420* 307   Blood Culture    Component Value Date/Time   SDES BLOOD RIGHT ANTECUBITAL 06/26/2021 1234   SPECREQUEST  06/26/2021 1234    BOTTLES DRAWN AEROBIC AND ANAEROBIC Blood Culture adequate volume   CULT  06/26/2021 1234    NO GROWTH 4 DAYS Performed at [facility], [place] [address] [place], [place] [address]    REPTSTATUS  PENDING 06/26/2021 1234    Cardiac Enzymes: No results for input(s): CKTOTAL, CKMB, CKMBINDEX, TROPONINI in the last 168 hours. CBG: Recent Labs  Lab 06/29/21 2212 06/30/21 0107 06/30/21 0525 06/30/21 0751 06/30/21 1136  GLUCAP 104* 116* 72 79 90   Iron Studies: No results for input(s): IRON, TIBC, TRANSFERRIN, FERRITIN in the last 72 hours. Lab Results  Component Value Date   INR 0.9 03/10/2021   INR 1.0 01/26/2021   Studies/Results: No results found.  Medications:  sodium chloride     iron sucrose 100 mg (06/25/21 1610)    apixaban  2.5 mg Per Tube BID   atorvastatin  40 mg Per Tube Daily   Chlorhexidine Gluconate Cloth  6 each Topical Daily   collagenase   Topical Daily   darbepoetin (ARANESP) injection - DIALYSIS  150 mcg Intravenous Q Mon-HD   feeding supplement (NEPRO CARB STEADY)  237 mL Per Tube TID WC   feeding supplement (PROSource TF)  45 mL Per Tube Daily   gabapentin  100 mg Per Tube Q12H   insulin aspart  0-6 Units Subcutaneous Q4H   lactulose  20 g Per Tube Daily   metaxalone  400 mg Per Tube TID   midodrine  10 mg Per Tube TID WC   mirtazapine  7.5 mg Per Tube QHS   multivitamin  1 tablet Per Tube QHS   pantoprazole sodium  40 mg Per Tube Daily   sodium chloride flush  10-40 mL Intracatheter Q12H   sodium chloride flush  3 mL Intravenous Q12H   thiamine  100 mg Per Tube Daily   venlafaxine  37.5 mg Per Tube BID    Dialysis Orders: AF MWF  3h 60mn 400/500    62.5kg    3K/2.5 Ca    P2  AVG HeRO   Hep none - Mircera 50 q 2 wks  (7/13)  Venofer 50 q wk - Parsabiv 517mIV q HD  Assessment/Plan: AMS/FTT: Providers and palliative care met w/ family and discussed pt's decline. Due to declining mental and physical status we don't anticipate she will be able to do OP dialysis.  Pt will have to be able to sit in a chair for 4-6 hrs to qualify for OP HD w/ our group. If she is able to sit for dialysis and cooperate, then the next step would to re-CLIP the  patient as her OP HD unit will not take her back.  Melena/Rectal bleeding - GI consulted. Hx rectal ulcers.  EGD 7/27 normal, flex sig with mucosal ulceration c/w sterocoral ulcers, non bleeding. Hgb 9.1, trending down again. ESRD: Continue HD per MWF schedule - plan for HD today. K 5.9 yesterday - given Lokelma x2 - down to 5.2 today, will give another 2 doses to hold to tomorrow. Will check RFP in AM Access Issues/AVG clotted: Had f'gram 6/23 which showed chronic thrombus and was started on Eliquis. AVG clotted 9/2, unable to be declotted d/t arm contracture. S/p non-tunneled HD line on 9/2 - plan to convert to tunneled line next week after holding Eliquis. Spoke to IR today who informed me that patient needs to be off Eliquis X 48 hrs before procedure can be performed (per Dr. McLaurence Ferrari Plan to discontinue Eliquis today (for 48hrs) and place IR procedure order. Already reached out to primary team to make them aware. Hypertension/volume: BP stable, on midodrine 1042mID with significant overload on exam. Need to ^ UFG with HD as tolerated, can give IV albumin prn. Nutrition: Albumin 1.7. Getting TFs via NG tube. 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  Anemia: Hgb down to 9.1, continue Aranesp 150m90m Monday.  Dispo: DNR/DNI. Poor prognosis. Palliative care has seen see #1. Appreciate their involvement. We do not believe dialysis is adding to her quality of life. Per family communication, family wishes to continue. Have asked primary team that family members sit with patient during HD to see the reality of situation and to hopefully console her as she cries entire treatment, asking for help.   [facility]ourTobie Poet [facility] 06/30/2021,1:35 PM  LOS: 41 days

## 2021-07-01 DIAGNOSIS — D638 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: Secondary | ICD-10-CM | POA: Diagnosis not present

## 2021-07-01 DIAGNOSIS — N186 End stage renal disease: Secondary | ICD-10-CM | POA: Diagnosis not present

## 2021-07-01 DIAGNOSIS — K922 Gastrointestinal hemorrhage, unspecified: Secondary | ICD-10-CM | POA: Diagnosis not present

## 2021-07-01 DIAGNOSIS — R627 Adult failure to thrive: Secondary | ICD-10-CM | POA: Diagnosis not present

## 2021-07-01 LAB — CULTURE, BLOOD (ROUTINE X 2)
Culture: NO GROWTH
Culture: NO GROWTH
Special Requests: ADEQUATE
Special Requests: ADEQUATE

## 2021-07-01 LAB — CBC
HCT: 28.8 % — ABNORMAL LOW (ref 36.0–46.0)
Hemoglobin: 9.1 g/dL — ABNORMAL LOW (ref 12.0–15.0)
MCH: 30.1 pg (ref 26.0–34.0)
MCHC: 31.6 g/dL (ref 30.0–36.0)
MCV: 95.4 fL (ref 80.0–100.0)
Platelets: 336 10*3/uL (ref 150–400)
RBC: 3.02 MIL/uL — ABNORMAL LOW (ref 3.87–5.11)
RDW: 17.2 % — ABNORMAL HIGH (ref 11.5–15.5)
WBC: 12.9 10*3/uL — ABNORMAL HIGH (ref 4.0–10.5)
nRBC: 0 % (ref 0.0–0.2)

## 2021-07-01 LAB — GLUCOSE, CAPILLARY
Glucose-Capillary: 108 mg/dL — ABNORMAL HIGH (ref 70–99)
Glucose-Capillary: 113 mg/dL — ABNORMAL HIGH (ref 70–99)
Glucose-Capillary: 149 mg/dL — ABNORMAL HIGH (ref 70–99)
Glucose-Capillary: 168 mg/dL — ABNORMAL HIGH (ref 70–99)
Glucose-Capillary: 180 mg/dL — ABNORMAL HIGH (ref 70–99)
Glucose-Capillary: 88 mg/dL (ref 70–99)
Glucose-Capillary: 99 mg/dL (ref 70–99)

## 2021-07-01 LAB — RENAL FUNCTION PANEL
Albumin: 1.8 g/dL — ABNORMAL LOW (ref 3.5–5.0)
Anion gap: 13 (ref 5–15)
BUN: 48 mg/dL — ABNORMAL HIGH (ref 8–23)
CO2: 25 mmol/L (ref 22–32)
Calcium: 8.3 mg/dL — ABNORMAL LOW (ref 8.9–10.3)
Chloride: 92 mmol/L — ABNORMAL LOW (ref 98–111)
Creatinine, Ser: 4.88 mg/dL — ABNORMAL HIGH (ref 0.44–1.00)
GFR, Estimated: 9 mL/min — ABNORMAL LOW (ref >60)
Glucose, Bld: 90 mg/dL (ref 70–99)
Phosphorus: 3.7 mg/dL (ref 2.5–4.6)
Potassium: 4.6 mmol/L (ref 3.5–5.1)
Sodium: 130 mmol/L — ABNORMAL LOW (ref 135–145)

## 2021-07-01 MED ORDER — CEFAZOLIN SODIUM-DEXTROSE 2-4 GM/100ML-% IV SOLN
2.0000 g | INTRAVENOUS | Status: AC
  Filled 2021-07-01: qty 100

## 2021-07-01 MED ORDER — OSMOLITE 1.2 CAL PO LIQD
1000.0000 mL | ORAL | Status: DC
  Administered 2021-07-01 – 2021-07-08 (×7): 1000 mL
  Filled 2021-07-01 (×10): qty 1000

## 2021-07-01 MED ORDER — HEPARIN SODIUM (PORCINE) 1000 UNIT/ML IJ SOLN
INTRAMUSCULAR | Status: AC
  Filled 2021-07-01: qty 1

## 2021-07-01 NOTE — Significant Event (Signed)
Rapid Response Event Note   Reason for Call :  New right eye swelling and increased facial swelling  Initial Focused Assessment:  On my arrival patient is drowsy but responsive to voice. She will open her eyes to command but unable to open R eye due to the amount of swelling. She answers questions appropriately and denies and pain or other concerns. On room air, in no distress. She has diffuse swelling in her extremities and is warm to touch.   Interventions:  Repositioned patient on her L side as much as possible but patient is unable to fully turn her head to the L. Sat patient more upright in bed.  Plan of Care:  RN to notify MD for further orders Continue to monitor closely Call RRT if further assistance is needed.  Event Summary:   MD Notified: RN notified Dr. Tonie Griffith Call Time: [phone] Arrival Time: [id number] End Time: [id number]   Monna Fam, RN

## 2021-07-01 NOTE — Progress Notes (Addendum)
[facility] PROGRESS NOTE  Amy [name] [id number] DOB: 08/28/1946 DOA: 05/19/2021 PCP: [name], MD  Status:  Remains inpatient appropriate because:Unsafe d/c plan, IV treatments appropriate due to intensity of illness or inability to take PO, and Inpatient level of care appropriate due to severity of illness  Dispo: The patient is from: Home              Anticipated d/c is to: SNF              Patient currently is medically stable to d/c.   Difficult to place patient Yes              Barriers to discharge: Dialysis patient with markedly decreased mobility-we will need to prove tolerance of sitting in chair for hemodialysis while here as well as tolerating sitting in wheelchair for up to 1 hour x 2 to replicate transport process.  Cannot discharge and proceed with outpatient hemodialysis unless this is accomplished.    Level of care: Med-Surg  Code Status:  Family Communication:  DVT prophylaxis:  COVID vaccination status:    HPI: 75 y.o. 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  Patient presented to the ED on 7/25 with dark stool.  On presentation, hemoglobin was 9.6; GI was consulted.  She underwent EGD which was normal and flexible sigmoidoscopy which showed nonbleeding distal rectal ulcers/stercoral ulcers.  Subsequently, GI signed off.  Neurology was also consulted for altered mental status; MRI brain was similar to prior MRI at [facility] with a large right MCA stroke.   Nephrology has been following for dialysis needs; nephrology is stating that patient will not be accepted back to outpatient dialysis at this time due to her underlying confusion,  weakness and inability to sit in a dialysis recliner as an outpatient.   Due to her poor oral intake, family wanted a PEG tube placement.  IR tried but was unable to place because of overlying transverse colon.   General surgery was consulted for G-tube placement but family currently undecided.  She has an NG tube in place.   Palliative care following for goals of care discussion.  Subjective: Examined during dialysis.  Discussed that tube feedings were being reinitiated since she has not eaten anything for multiple days.  Patient seemed entirely unaware that she was not eating.  Objective: Vitals:   07/01/21 1130 07/01/21 1230  BP: (!) 121/42 (!) 126/56  Pulse: (!) 115 (!) 109  Resp:  17  Temp:  (!) 97.2 F (36.2 C)  SpO2:  100%    Intake/Output Summary (Last 24 hours) at 07/01/2021 1306 Last data filed at 07/01/2021 1157 Gross per 24 hour  Intake 0 ml  Output 3500 ml  Net -3500 ml   Filed Weights   07/01/21 0814 07/01/21 1230  Weight: 105.4 kg 102 kg    Exam:  Constitutional: NAD, calm, calm and in dialysis Respiratory: Room air, lungs remain clear to auscultation, no increased work of breathing.  Normal pulse oximetry reading Cardiovascular: Currently normotensive during hemodialysis, normal heart sounds, chronic stable upper and lower extremity peripheral edema pulse remains regular. Abdomen: no tenderness, no masses palpated. Bowel sounds positive. LBM 9/05-cortrack for feeding-extremely poor oral intake Neurologic: CN 2-12 grossly intact -  Strength 3/5 on the right.  With chronic left hemiparesis from prior CVA.  Continues to have left-sided neglect. Psychiatric: Awake and oriented to name and place.  More appropriate with conversational speech today despite being confused.    Assessment/Plan: Acute problems: Acute on chronic lower GI bleeding from stercoral ulcers -Patient presented with anemia,  underwent EGD which was normal and flex sigmoidoscopy with clean base,  nonbleeding distal rectal ulcers (stercoral ulcers). Gastroenterology now signed off  -Hemoglobin stable (7-8).     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  Merrily consistent with encephalopathy Lipid panel and hemoglobin A1c within normal limits.  Continue Lipitor Diet as per SLP recommendations.  Extremely poor to no oral intake Cortrak tube in place for enteral feedings Has not eaten any oral food for the past 4 days therefore will resume continuous tube feedings. Discussed with surgical team.  Patient needs to prove tolerance of HD in chair before consideration for open G-tube noting this would be a high risk procedure.  I have asked staff to document when patient up for dialysis since and whether she tolerates or has to be placed back in bed    ESRD on HD Per Nephro Patient is not a candidate for continued outpatient HD given her confusion, weakness and inability to sit up in a chair for HD outpatient.  On 9/6 I examined patient during dialysis noting she was in the bed and not up in chair Nephrology suggested transition to hospice care but family resistant.  PMT spoke with son by phone recently and he insisted that as long as his mother could speak that she would remain full aggressive care.[name] Physically patient appears to be tolerating HD in chair although emotionally she becomes frustrated and anxious during dialysis Continue Neurontin and Oxy IR pain contributing to some of her intolerance to HD 9/02: LUE AVG occluded R planning to convert from nontunneled IJ access to a tunneled dialysis catheter this week  Hyperkalemia/hypophosphatemia Management per nephrology HD -supplementation as needed   Hypotension Blood pressure currently is stable on midodrine.   Failure to thrive  /Generalized deconditioning  /very poor oral intake PEG tube was desired by family but could not be placed because of overlying transverse colon.   Tube feeding on hold as above in favor of attempting to feed orally with Glucerna shakes supplement; unfortunately patient continues with no oral intake.  Likely will need to resume continuous tube feedings on 9/6 Discussed with general surgery on 8/31 and if patient can tolerate dialysis in chair and plan is to discuss possible open G-tube placed  Constipation No further abdominal pain and recent films without evidence of constipation Patient was on chronic narcotics prior to admission and these remain in place Patient was on 40 g Chronulac daily prn.  Continue scheduled Chronulac 20 g daily and presentation with rectal ulcers likely secondary to constipation chronic  Type 2 diabetes mellitus Episodes of hypoglycemia Hemoglobin A1c 5.1.  Diet controlled at baseline  Chronic thrombus left upper extremity Continue Eliquis Elevate as appropriate  Stage II mid coccyx pressure injury, present on admission Continue wound care    GERD Could also be contributing to patient's poor oral intake Increased frequency of Protonix to BID sitter adding Pepcid     Data Reviewed: Basic Metabolic Panel: Recent Labs  Lab  06/25/21 1241 06/27/21 1332 06/28/21 0249 06/29/21 0140 07/01/21 0647  NA 128* 133* 132* 131* 130*  K 5.7* 6.3* 5.9* 5.2* 4.6  CL 92* 94* 95* 95* 92*  CO2 '27 28 26 28 25  '$ GLUCOSE 99 110* 82 82 90  BUN 36* 36* 28* 33* 48*  CREATININE 3.97* 4.00* 3.40* 3.98* 4.88*  CALCIUM 7.9* 8.1* 8.0* 8.0* 8.3*  PHOS 3.7 2.7 2.7 3.3 3.7   Liver Function Tests: Recent Labs  Lab 06/25/21 1241 06/27/21 1332 06/28/21 0249 06/29/21 0140 07/01/21 0647  ALBUMIN 1.7* 1.9* 1.9* 1.8* 1.8*   No results for input(s): LIPASE, AMYLASE in the last 168 hours. No results for input(s): AMMONIA in the last 168 hours. CBC: Recent Labs  Lab  06/27/21 1332 06/29/21 0140 07/01/21 0808  WBC 11.4* 13.2* 12.9*  NEUTROABS 8.9* 10.0*  --   HGB 9.4* 9.1* 9.1*  HCT 31.0* 29.5* 28.8*  MCV 97.5 98.3 95.4  PLT 420* 307 336   Cardiac Enzymes: No results for input(s): CKTOTAL, CKMB, CKMBINDEX, TROPONINI in the last 168 hours. BNP (last 3 results) No results for input(s): BNP in the last 8760 hours.  ProBNP (last 3 results) No results for input(s): PROBNP in the last 8760 hours.  CBG: Recent Labs  Lab 06/30/21 1606 06/30/21 2012 07/01/21 0034 07/01/21 0421 07/01/21 0758  GLUCAP 162* 158* 108* 113* 99    Recent Results (from the past 240 hour(s))  Culture, blood (routine x 2)     Status: None   Collection Time: 06/26/21 12:22 PM   Specimen: BLOOD  Result Value Ref Range Status   Specimen Description BLOOD RIGHT ANTECUBITAL  Final   Special Requests   Final    BOTTLES DRAWN AEROBIC AND ANAEROBIC Blood Culture adequate volume   Culture   Final    NO GROWTH 5 DAYS Performed at [facility], [place] [address], [place], [place] [address]    Report Status 07/01/2021 FINAL  Final  Culture, blood (routine x 2)     Status: None   Collection Time: 06/26/21 12:34 PM   Specimen: BLOOD  Result Value Ref Range Status   Specimen Description BLOOD RIGHT ANTECUBITAL  Final   Special Requests   Final    BOTTLES DRAWN AEROBIC AND ANAEROBIC Blood Culture adequate volume   Culture   Final    NO GROWTH 5 DAYS Performed at [facility], [place] [address] [place], [place] [address]    Report Status 07/01/2021 FINAL  Final     Studies: No results found.  Scheduled Meds:  atorvastatin  40 mg Per Tube Daily   Chlorhexidine Gluconate Cloth  6 each Topical Daily   collagenase   Topical Daily   darbepoetin (ARANESP) injection - DIALYSIS  150 mcg Intravenous Q Mon-HD   feeding supplement (PROSource TF)  45 mL Per Tube Daily   gabapentin  100 mg Per Tube Q12H   heparin sodium (porcine)       insulin aspart  0-6 Units  Subcutaneous Q4H   lactulose  20 g Per Tube Daily   metaxalone  400 mg Per Tube TID   midodrine  10 mg Per Tube TID WC   mirtazapine  7.5 mg Per Tube QHS   multivitamin  1 tablet Per Tube QHS   pantoprazole sodium  40 mg Per Tube Daily   sodium chloride flush  10-40 mL Intracatheter Q12H   sodium chloride flush  3 mL Intravenous Q12H   thiamine  100 mg  Per Tube Daily   venlafaxine  37.5 mg Per Tube BID   Continuous Infusions:  sodium chloride      ceFAZolin (ANCEF) IV     feeding supplement (OSMOLITE 1.2 CAL)     iron sucrose Stopped (06/30/21 1416)    Principal Problem:   GI bleed Active Problems:   Essential hypertension   Type 2 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, with long-term current use of insulin (HCC)   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   ESRD on dialysis ([place])   Dyslipidemia   Hypokalemia   Stage 2 skin ulcer of sacral region [facility])   Cerebral thrombosis with cerebral infarction   Protein-calorie malnutrition, severe   Acute GI bleeding   Oropharyngeal dysphagia   Failure to thrive in adult   Inadequate oral intake   Clogged feeding tube   Fever   Consultants: Vascular surgery Nephrology Gastroenterology Neurology Palliative medicine General surgery  Procedures: EEG Echocardiogram Cortrack EGD  Antibiotics: Ceftriaxone 8/3 through 8/5   Time spent: 45 minutes    Erin Hearing ANP  [facility] 7 am - 330 pm/M-F for direct patient care and secure chat Please refer to Amion for contact info 42  days

## 2021-07-01 NOTE — Progress Notes (Signed)
Upon turning patient to perform incontinent care her right eye was notably more edematous than on my previous assessment.  Edema prevents patient from opening her right eye but upon opening her eye with my hand she is able to move her eyeball.  Edema appears to be dependent due to the way the patient lays her head to the right side.  RR RN asked to assess patient for further insight.  See note for more detail. Dr. Tonie Griffith notified via secure chat.  No new orders given at this time.  Nursing staff to continue to monitor.

## 2021-07-01 NOTE — Consult Note (Addendum)
Chief Complaint: Ongoing dialysis access. Request is for temp to tunneled HD conversion  Referring Physician(s): C. Ouida Sills NP  Supervising Physician: Corrie Mckusick  Patient Status: [facility] - In-pt  History of Present Illness: Amy [name] is a 75 y.o. female inpatient. 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 HD access is a left upper extremity  loop AV graft created on 1.6.22 and a prior brachial to axillary vein AVG 11.4.21  Team unable to palpate a bruit or aspirate blood return during dialysis on 9.2.22. IR performed  a shuntogram  on 9.2.22 where it was determined that the AV graft was thrombosis. IR placed a LIJ temp dialysis catheter at that time. Team is requested a temp to tunneled conversion.   Return precautions and treatment recommendations and follow-up discussed with the patient's son via the telephone who is agreeable with the plan.    Past Medical History:  Diagnosis Date   Chronic kidney disease    dialysis M-W-F - dialysis cath located in right side of chest   Diabetes mellitus without complication ([place])    type 2 - no meds    HLD (hyperlipidemia)    diet controlled   Hypertension    no meds    Past Surgical History:  Procedure Laterality Date   A/V FISTULAGRAM Left 01/10/2021   Procedure: A/V FISTULAGRAM;  Surgeon: Angelia Mould, MD;  Location: [place] [facility];  Service: Cardiovascular;  Laterality: Left;   A/V FISTULAGRAM N/A 01/27/2021   Procedure: A/V FISTULAGRAM;  Surgeon: Waynetta Sandy, MD;  Location: [place] [facility];  Service: Cardiovascular;  Laterality: N/A;   AV FISTULA PLACEMENT Left 09/24/2020   Procedure: LEFT UPPER ARM ARTERIOVENOUS GORTEX  GRAFT;  Surgeon: Elam Dutch, MD;  Location: [facility];  Service: Vascular;  Laterality: Left;   BREAST CYST EXCISION Right 2011   cyst removed -neg   ESOPHAGOGASTRODUODENOSCOPY (EGD) WITH PROPOFOL N/A 05/21/2021   Procedure: ESOPHAGOGASTRODUODENOSCOPY  (EGD) WITH PROPOFOL;  Surgeon: Jackquline Denmark, MD;  Location: [facility] ENDOSCOPY;  Service: Endoscopy;  Laterality: N/A;   EYE SURGERY Bilateral    cataracts   FLEXIBLE SIGMOIDOSCOPY N/A 05/21/2021   Procedure: FLEXIBLE SIGMOIDOSCOPY;  Surgeon: Jackquline Denmark, MD;  Location: [facility] ENDOSCOPY;  Service: Endoscopy;  Laterality: N/A;   INSERTION OF DIALYSIS CATHETER Right 07/2019   IR FLUORO GUIDE CV LINE LEFT  06/27/2021   IR GASTROSTOMY TUBE MOD SED  05/29/2021   IR US GUIDE VASC ACCESS LEFT  06/27/2021   PERIPHERAL VASCULAR BALLOON ANGIOPLASTY  01/10/2021   Procedure: PERIPHERAL VASCULAR BALLOON ANGIOPLASTY;  Surgeon: Angelia Mould, MD;  Location: [place] [facility];  Service: Cardiovascular;;  left AV graft   TUBAL LIGATION     Ultrasound-guided cannulation left upper arm AV graft  01/27/2021    Allergies: Sensipar [cinacalcet] and Statins  Medications: Prior to Admission medications   Medication Sig Start Date End Date Taking? Authorizing Provider  acetaminophen (TYLENOL) 500 MG tablet Take 1,000 mg by mouth every 6 (six) hours as needed for mild pain.   Yes [provider]  apixaban (ELIQUIS) 5 MG TABS tablet Take 5 mg by mouth 2 (two) times daily.   Yes [provider]  atorvastatin (LIPITOR) 40 MG tablet Take 40 mg by mouth daily.   Yes [provider]  dibucaine (NUPERCAINAL) 1 % OINT Place 1 application rectally every 2 (two) hours as needed (pain).   Yes [provider]  diclofenac Sodium (VOLTAREN) 1 %  GEL Apply 2 g topically 4 (four) times daily.   Yes [provider]  FLUoxetine (PROZAC) 40 MG capsule Take 40 mg by mouth daily.   Yes [provider]  gabapentin (NEURONTIN) 100 MG capsule Take 100 mg by mouth at bedtime.   Yes [provider]  hydrocortisone 1 % ointment Apply 1 application topically 2 (two) times daily. Left side of neck   Yes [provider]  lactulose (CHRONULAC) 10 GM/15ML solution Take 60 mLs (40  g total) by mouth daily as needed for mild constipation. Patient taking differently: Take 27 g by mouth daily as needed for mild constipation. 03/11/21  Yes [name], MD  lidocaine (LIDODERM) 5 % Place 1 patch onto the skin See admin instructions. Apply to right neck in the morning and remove at bedtime   Yes [provider]  lidocaine-prilocaine (EMLA) cream Apply 1 application topically every Monday, Wednesday, and Friday. 1-2  Hours prior to dialysis 01/17/21  Yes [provider]  midodrine (PROAMATINE) 10 MG tablet Take 10 mg by mouth in the morning and at bedtime.   Yes [provider]  mirtazapine (REMERON) 7.5 MG tablet Take 7.5 mg by mouth at bedtime.   Yes [provider]  multivitamin (RENA-VIT) TABS tablet Take 1 tablet by mouth daily.   Yes [provider]  Nutritional Supplements (ENSURE CLEAR PO) Take 237 mLs by mouth in the morning, at noon, and at bedtime.   Yes [provider]  nystatin cream (MYCOSTATIN) Apply 1 application topically See admin instructions. Apply to groin as needed x 14 days for yeast   Yes [provider]  ondansetron (ZOFRAN) 4 MG tablet Take 4 mg by mouth every 6 (six) hours as needed for nausea or vomiting.   Yes [provider]  oxyCODONE (OXY IR/ROXICODONE) 5 MG immediate release tablet Take 5 mg by mouth every 12 (twelve) hours.   Yes [provider]  pantoprazole (PROTONIX) 20 MG tablet Take 1 tablet (20 mg total) by mouth daily. 03/11/21  Yes [name], MD  venlafaxine XR (EFFEXOR-XR) 75 MG 24 hr capsule Take 75 mg by mouth daily. 02/03/21  Yes [provider]     Family History  Problem Relation Age of Onset   Breast cancer Neg Hx     Social History   Socioeconomic History   Marital status: Single    Spouse name: Not on file   Number of children: Not on file   Years of education: Not on file   Highest education level: Not on file   Occupational History   Not on file  Tobacco Use   Smoking status: Former   Smokeless tobacco: Never  Vaping Use   Vaping Use: Never used  Substance and Sexual Activity   Alcohol use: Yes    Comment: occasional   Drug use: Not Currently    Types: Marijuana    Comment: Last use 2020   Sexual activity: Not on file    Comment: Tubal  Other Topics Concern   Not on file  Social History Narrative   Not on file   Social Determinants of Health   Financial Resource Strain: Not on file  Food Insecurity: Not on file  Transportation Needs: Not on file  Physical Activity: Not on file  Stress: Not on file  Social Connections: Not on file    Review of Systems: A 12 point ROS discussed and pertinent positives are indicated in the HPI above.  All other systems are negative.  Review of Systems  Unable to perform ROS: Acuity of condition   Vital Signs: BP (!) 89/69 (BP Location: Right Arm)   Pulse (!) 108   Temp 97.9 F (36.6 C) (Oral)   Resp 17   Ht '5\' 7"'$  (1.702 m)   Wt 224 lb 13.9 oz (102 kg)   LMP  (LMP Unknown)   SpO2 97%   BMI 35.22 kg/m   Physical Exam Vitals and nursing note reviewed.  Constitutional:      Appearance: She is well-developed.  HENT:     Head: Normocephalic and atraumatic.     Comments: NG tube present Eyes:     Conjunctiva/sclera: Conjunctivae normal.  Cardiovascular:     Rate and Rhythm: Normal rate and regular rhythm.     Heart sounds: Normal heart sounds.     Comments: LIJ temp catheter present. Left AVG  Pulmonary:     Effort: Pulmonary effort is normal.     Breath sounds: Normal breath sounds.  Musculoskeletal:     Cervical back: Normal range of motion.  Neurological:     Mental Status: She is alert. She is disoriented.    Imaging: IR Fluoro Guide CV Line Left  Result Date: 06/27/2021 INDICATION: 75 year old female with end-stage renal disease on hemodialysis via a left arm arteriovenous graft which was placed in January of this year.  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 Additionally, she is no longer able to walk and is now bed ridden with a large decubitus ulceration. Her left upper extremity arteriovenous graft could not be accessed at hemodialysis today. Bedside evaluation and ultrasound reveals that the graft is completely thrombosed. Unfortunately, due to the contraction ring of her left arm, suitable access to the AV graft can not be obtained to facilitate declotting. Therefore, we will proceed with placement of a temporary hemodialysis catheter (the patient is on Eliquis). This can be converted next week to a tunneled hemodialysis catheter after holding the Eliquis for 48 hours. EXAM: IR ULTRASOUND GUIDANCE VASC ACCESS LEFT; IR LEFT FLUORO GUIDE CV LINE MEDICATIONS: None. ANESTHESIA/SEDATION: None. FLUOROSCOPY TIME:  - COMPLICATIONS: None immediate. PROCEDURE: Informed written consent was obtained from the patient after a thorough discussion of the procedural risks, benefits and alternatives. All questions were addressed. Maximal Sterile Barrier Technique was utilized including caps, mask, sterile gowns, sterile gloves, sterile drape, hand hygiene and skin antiseptic. A timeout was performed prior to the initiation of the procedure. The left internal jugular vein was interrogated with ultrasound and found to be widely patent. An image was obtained and stored for the medical record. Local anesthesia was attained by infiltration with 1% lidocaine. A small dermatotomy was made. Under real-time sonographic guidance, the vessel was punctured with a 21 gauge micropuncture needle. Using standard technique, the initial micro needle was exchanged over a 0.018 micro wire for a transitional 4 [place] micro sheath. The micro sheath was then exchanged over a 0.035 wire for a fascial dilator and the soft tissue tract was dilated. A 24 cm  triple-lumen hemodialysis catheter was then advanced over the wire and position with the tip in the right atrium. All 3 lumens of the catheter flushes and aspirates easily. The were flushed with saline and then heparinized saline. The catheter was secured to the skin with 0 Prolene suture. The catheter was capped and sterile bandages were placed. IMPRESSION:  1. Thrombosed left upper extremity arteriovenous graft. Unfortunately, due to contractures of the left arm, declot is not possible. 2. Placement of a temporary left IJ triple-lumen hemodialysis catheter. The catheter tips are in the right atrium and ready for immediate use. Tentative plan for the patient to hold Eliquis for 48 hours next week prior to conversion of temporary catheter to a tunneled hemodialysis catheter. Electronically Signed   By: Jacqulynn Cadet M.D.   On: 06/27/2021 16:52   IR US Guide Vasc Access Left  Result Date: 06/27/2021 INDICATION: 75 year old female with end-stage renal disease on hemodialysis via a left arm arteriovenous graft which was placed in January of this year. 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 Additionally, she is no longer able to walk and is now bed ridden with a large decubitus ulceration. Her left upper extremity arteriovenous graft could not be accessed at hemodialysis today. Bedside evaluation and ultrasound reveals that the graft is completely thrombosed. Unfortunately, due to the contraction ring of her left arm, suitable access to the AV graft can not be obtained to facilitate declotting. Therefore, we will proceed with placement of a temporary hemodialysis catheter (the patient is on Eliquis). This can be converted next week to a tunneled hemodialysis catheter after holding the Eliquis for 48 hours. EXAM: IR ULTRASOUND GUIDANCE VASC ACCESS LEFT; IR LEFT FLUORO GUIDE CV LINE MEDICATIONS: None.  ANESTHESIA/SEDATION: None. FLUOROSCOPY TIME:  - COMPLICATIONS: None immediate. PROCEDURE: Informed written consent was obtained from the patient after a thorough discussion of the procedural risks, benefits and alternatives. All questions were addressed. Maximal Sterile Barrier Technique was utilized including caps, mask, sterile gowns, sterile gloves, sterile drape, hand hygiene and skin antiseptic. A timeout was performed prior to the initiation of the procedure. The left internal jugular vein was interrogated with ultrasound and found to be widely patent. An image was obtained and stored for the medical record. Local anesthesia was attained by infiltration with 1% lidocaine. A small dermatotomy was made. Under real-time sonographic guidance, the vessel was punctured with a 21 gauge micropuncture needle. Using standard technique, the initial micro needle was exchanged over a 0.018 micro wire for a transitional 4 [place] micro sheath. The micro sheath was then exchanged over a 0.035 wire for a fascial dilator and the soft tissue tract was dilated. A 24 cm triple-lumen hemodialysis catheter was then advanced over the wire and position with the tip in the right atrium. All 3 lumens of the catheter flushes and aspirates easily. The were flushed with saline and then heparinized saline. The catheter was secured to the skin with 0 Prolene suture. The catheter was capped and sterile bandages were placed. IMPRESSION: 1. Thrombosed left upper extremity arteriovenous graft. Unfortunately, due to contractures of the left arm, declot is not possible. 2. Placement of a temporary left IJ triple-lumen hemodialysis catheter. The catheter tips are in the right atrium and ready for immediate use. Tentative plan for the patient to hold Eliquis for 48 hours next week prior to conversion of temporary catheter to a tunneled hemodialysis catheter. Electronically Signed   By: Jacqulynn Cadet M.D.   On: 06/27/2021 16:52   DG Abd  Portable 1V  Result Date: 06/25/2021 CLINICAL DATA:  Feeding tube placement. EXAM: PORTABLE ABDOMEN - 1 VIEW COMPARISON:  Radiographs 06/23/2021 and 06/13/2021. FINDINGS: 1151 hours. Tip of the feeding tube projects over the right upper quadrant of the  abdomen, consistent with location in the distal stomach or proximal duodenum. The visualized bowel gas pattern is nonobstructive. Convex left thoracolumbar scoliosis again noted. IMPRESSION: Feeding tube projects to the level of the distal stomach or proximal duodenum. Electronically Signed   By: Richardean Sale M.D.   On: 06/25/2021 12:34   DG Abd Portable 1V  Result Date: 06/23/2021 CLINICAL DATA:  Check feeding catheter placement EXAM: PORTABLE ABDOMEN - 1 VIEW COMPARISON:  06/13/2021 FINDINGS: Feeding catheter has withdrawn slightly from the prior exam but remains in the midportion of the stomach. No obstructive changes are seen. IMPRESSION: Feeding catheter within the mid stomach. Electronically Signed   By: Inez Catalina M.D.   On: 06/23/2021 14:44   DG Abd Portable 1V  Result Date: 06/13/2021 CLINICAL DATA:  Feeding tube placement EXAM: PORTABLE ABDOMEN - 1 VIEW COMPARISON:  None. FINDINGS: Feeding tube with the tip projecting over the antrum of the stomach. Mild gaseous distension of the small bowel and colon. No bowel dilatation to suggest obstruction. No evidence of pneumoperitoneum, portal venous gas or pneumatosis. No pathologic calcifications along the expected course of the ureters. Elevation of the right diaphragm. No acute osseous abnormality. IMPRESSION: Feeding tube with the tip projecting over the antrum of the stomach. Electronically Signed   By: Kathreen Devoid M.D.   On: 06/13/2021 12:01   DG Abd Portable 1V  Result Date: 06/11/2021 CLINICAL DATA:  Feeding tube placement. EXAM: PORTABLE ABDOMEN - 1 VIEW COMPARISON:  IR fluoroscopy, 05/29/2021. CT abdomen pelvis, 05/27/2021. FINDINGS: Enteric feeding tube, with tip projected to the right  of midline. The imaged bowel is not obstructed. Contrast within colon, likely ingested. No interval osseous abnormality. IMPRESSION: Transpyloric enteric feeding tube Electronically Signed   By: Michaelle Birks M.D.   On: 06/11/2021 10:53   VAS [place] UPPER EXTREMITY VENOUS DUPLEX  Result Date: 06/23/2021 UPPER VENOUS STUDY  Patient Name:  Amy [name] Cohron  Date of Exam:   06/22/2021 Medical Rec #: [id number]         Accession #:    [id number] Date of Birth: July 14, 1946         Patient Gender: F Patient Age:   67 years Exam Location:  [facility] Procedure:      VAS [place] UPPER EXTREMITY VENOUS DUPLEX Referring Phys: Terrilee Croak --------------------------------------------------------------------------------  Indications: Swelling Limitations: Poor ultrasound/tissue interface, body habitus and Severe swelling. Comparison Study: No prior left UEV on file Performing Technologist: Sharion Dove RVS  Examination Guidelines: A complete evaluation includes B-mode imaging, spectral Doppler, color Doppler, and power Doppler as needed of all accessible portions of each vessel. Bilateral testing is considered an integral part of a complete examination. Limited examinations for reoccurring indications may be performed as noted.  Right Findings: +----------+------------+---------+-----------+----------+-------+ RIGHT     CompressiblePhasicitySpontaneousPropertiesSummary +----------+------------+---------+-----------+----------+-------+ Subclavian               Yes       Yes                      +----------+------------+---------+-----------+----------+-------+  Left Findings: +----------+------------+---------+-----------+----------+---------------------+ LEFT      CompressiblePhasicitySpontaneousProperties       Summary        +----------+------------+---------+-----------+----------+---------------------+ IJV                      Yes       Yes                                     +----------+------------+---------+-----------+----------+---------------------+  Subclavian               Yes       Yes                                    +----------+------------+---------+-----------+----------+---------------------+ Axillary                 Yes       Yes                                    +----------+------------+---------+-----------+----------+---------------------+ Brachial                 Yes       Yes                not all portions                                                             visualized       +----------+------------+---------+-----------+----------+---------------------+ Radial                                              patent by color, not                                                          all portions                                                               visualized       +----------+------------+---------+-----------+----------+---------------------+ Cephalic      Full                                    Not all portions                                                             visualized       +----------+------------+---------+-----------+----------+---------------------+ Basilic                                                Not visualized     +----------+------------+---------+-----------+----------+---------------------+  *See table(s) above for measurements and observations.  Diagnosing physician: Servando Snare MD Electronically signed by Servando Snare MD on 06/23/2021  at 5:31:32 PM.    Final     Labs:  CBC: Recent Labs    06/22/21 1742 06/23/21 0723 06/23/21 1048 06/27/21 1332 06/29/21 0140 07/01/21 0808  WBC 11.8*  --   --  11.4* 13.2* 12.9*  HGB 6.8*   < > 10.2* 9.4* 9.1* 9.1*  HCT 21.7*   < > 30.0* 31.0* 29.5* 28.8*  PLT 245  --   --  420* 307 336   < > = values in this interval not displayed.    COAGS: Recent Labs    01/26/21 2017 03/10/21 1228  INR 1.0  0.9  APTT 32  --     BMP: Recent Labs    06/27/21 1332 06/28/21 0249 06/29/21 0140 07/01/21 0647  NA 133* 132* 131* 130*  K 6.3* 5.9* 5.2* 4.6  CL 94* 95* 95* 92*  CO2 '28 26 28 25  '$ GLUCOSE 110* 82 82 90  BUN 36* 28* 33* 48*  CALCIUM 8.1* 8.0* 8.0* 8.3*  CREATININE 4.00* 3.40* 3.98* 4.88*  GFRNONAA 11* 14* 11* 9*    LIVER FUNCTION TESTS: Recent Labs    05/01/21 2147 05/19/21 0920 05/20/21 0448 05/21/21 1700 05/27/21 0531 05/31/21 1858 06/27/21 1332 06/28/21 0249 06/29/21 0140 07/01/21 0647  BILITOT 0.9 0.9 0.6  --  0.8  --   --   --   --   --   AST 38 57* 55*  --  34  --   --   --   --   --   ALT '13 20 25  '$ --  26  --   --   --   --   --   ALKPHOS 68 76 68  --  78  --   --   --   --   --   PROT 6.8 5.9* 5.7*  --  6.4*  --   --   --   --   --   ALBUMIN 3.2* 2.4* 2.3*   < > 2.4*   < > 1.9* 1.9* 1.8* 1.8*   < > = values in this interval not displayed.     Assessment and Plan:  75 y.o. female inpatient. 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 HD access is a left upper extremity  loop AV graft created on 1.6.22 and a prior brachial to axillary vein AVG 11.4.21  Team unable to palpate a bruit or aspirate blood return during dialysis on 9.2.22. IR performed  a shuntogram  on 9.2.22 where it was determined that the AV graft was thrombosis. IR placed a LIJ temp dialysis catheter at that time. Team is requested a temp to tunneled conversion.  WBC is 12.9, chloride 92, BUN 48., Cr 4.8. albumin 1.8. Patient is on eliquis. Last dose given on 9.5.22. All other labs and mediations are within acceptable parameters. No pertinent allergies.   IR consulted for possible temp to tunneled HD catheter. Patient tentatively scheduled for 9.7.22.  Team instructed to: Keep Patient to be NPO after midnight Continue to hold eliquis  IR will call patient when ready.  Risks and benefits discussed with the patient including, but not limited to bleeding, infection,  vascular injury, pneumothorax which may require chest tube placement, air embolism or even death  All of the patient's questions were answered, patient is agreeable to proceed. Consent signed and in  IR control room   Thank you for this interesting consult.  I greatly enjoyed meeting RATASHA FICKEN and look  forward to participating in their care.  A copy of this report was sent to the requesting provider on this date.  Electronically Signed: Jacqualine Mau, NP 07/01/2021, 1:54 PM   I spent a total of 40 Minutes    in face to face in clinical consultation, greater than 50% of which was counseling/coordinating care for temp to tunneled HD cathter

## 2021-07-01 NOTE — Progress Notes (Signed)
[facility] Progress Note   Subjective:   seen in room  Objective Vitals:   07/01/21 1130 07/01/21 1230 07/01/21 1312 07/01/21 1315  BP: (!) 121/42 (!) 126/56 (!) 89/69   Pulse: (!) 115 (!) 109 (!) 108 98  Resp:  17    Temp:  (!) 97.2 F (36.2 C) 97.9 F (36.6 C)   TempSrc:  Oral Oral   SpO2:  100% 97%   Weight:  102 kg    Height:       Physical Exam General: Chronically ill woman; NAD; NGT in place. Heart: S1 and S2; No murmurs, gallops, or rubs Lungs: Clear anteriorly; No wheezing, rales, or rhonchi Abdomen: Soft and non-tender Extremities: 2+ edema BUE and 2+ BL hips Dialysis Access: L Non-tunneled catheter  Filed Weights   07/01/21 0814 07/01/21 1230  Weight: 105.4 kg 102 kg    Intake/Output Summary (Last 24 hours) at 07/01/2021 1646 Last data filed at 07/01/2021 1157 Gross per 24 hour  Intake 0 ml  Output 3500 ml  Net -3500 ml     Additional Objective Labs: Basic Metabolic Panel: Recent Labs  Lab 06/28/21 0249 06/29/21 0140 07/01/21 0647  NA 132* 131* 130*  K 5.9* 5.2* 4.6  CL 95* 95* 92*  CO2 26 28 25   GLUCOSE 82 82 90  BUN 28* 33* 48*  CREATININE 3.40* 3.98* 4.88*  CALCIUM 8.0* 8.0* 8.3*  PHOS 2.7 3.3 3.7    Liver Function Tests: Recent Labs  Lab 06/28/21 0249 06/29/21 0140 07/01/21 0647  ALBUMIN 1.9* 1.8* 1.8*    No results for input(s): LIPASE, AMYLASE in the last 168 hours. CBC: Recent Labs  Lab 06/27/21 1332 06/29/21 0140 07/01/21 0808  WBC 11.4* 13.2* 12.9*  NEUTROABS 8.9* 10.0*  --   HGB 9.4* 9.1* 9.1*  HCT 31.0* 29.5* 28.8*  MCV 97.5 98.3 95.4  PLT 420* 307 336    Blood Culture    Component Value Date/Time   SDES BLOOD RIGHT ANTECUBITAL 06/26/2021 1234   SPECREQUEST  06/26/2021 1234    BOTTLES DRAWN AEROBIC AND ANAEROBIC Blood Culture adequate volume   CULT  06/26/2021 1234    NO GROWTH 5 DAYS Performed at [facility], [place] [address] [place], [place] [address]    REPTSTATUS 07/01/2021  FINAL 06/26/2021 1234    Cardiac Enzymes: No results for input(s): CKTOTAL, CKMB, CKMBINDEX, TROPONINI in the last 168 hours. CBG: Recent Labs  Lab 07/01/21 0034 07/01/21 0421 07/01/21 0758 07/01/21 1345 07/01/21 1628  GLUCAP 108* 113* 99 88 149*    Iron Studies: No results for input(s): IRON, TIBC, TRANSFERRIN, FERRITIN in the last 72 hours. Lab Results  Component Value Date   INR 0.9 03/10/2021   INR 1.0 01/26/2021   Studies/Results: No results found.  Medications:  sodium chloride      ceFAZolin (ANCEF) IV     feeding supplement (OSMOLITE 1.2 CAL) 1,000 mL (07/01/21 1347)   iron sucrose Stopped (06/30/21 1416)    atorvastatin  40 mg Per Tube Daily   Chlorhexidine Gluconate Cloth  6 each Topical Daily   collagenase   Topical Daily   darbepoetin (ARANESP) injection - DIALYSIS  150 mcg Intravenous Q Mon-HD   feeding supplement (PROSource TF)  45 mL Per Tube Daily   gabapentin  100 mg Per Tube Q12H   heparin sodium (porcine)       insulin aspart  0-6 Units Subcutaneous Q4H   lactulose  20 g Per Tube Daily  metaxalone  400 mg Per Tube TID   midodrine  10 mg Per Tube TID WC   mirtazapine  7.5 mg Per Tube QHS   multivitamin  1 tablet Per Tube QHS   pantoprazole sodium  40 mg Per Tube Daily   sodium chloride flush  10-40 mL Intracatheter Q12H   sodium chloride flush  3 mL Intravenous Q12H   thiamine  100 mg Per Tube Daily   venlafaxine  37.5 mg Per Tube BID    Dialysis Orders: AF MWF  3h 23mn 400/500    62.5kg    3K/2.5 Ca    P2  AVG HeRO   Hep none - Mircera 50 q 2 wks  (7/13)  Venofer 50 q wk - Parsabiv 585mIV q HD  Assessment/Plan: AMS/FTT: Providers and palliative care met w/ family and discussed pt's decline. Due to declining mental and physical status we don't anticipate she will be able to do OP dialysis.  Pt will have to be able to sit in a chair for 4-6 hrs to qualify for OP HD w/ our group. If she is able to sit for dialysis and cooperate, then the  next step would to re-CLIP the patient as her OP HD unit will not take her back.  Melena/Rectal bleeding - GI consulted. Hx rectal ulcers.  EGD 7/27 normal, flex sig with mucosal ulceration c/w sterocoral ulcers, non bleeding. Hgb 9.1, trending down again. ESRD: HD MWF. HD today, rolled over from yesterday d/t high census. Will keep on TTS for now.  Access Issues/AVG clotted: Had f'gram 6/23 which showed chronic thrombus and was started on Eliquis. AVG clotted 9/2, unable to be declotted d/t arm contracture. S/p non-tunneled HD line on 9/2 - plan to convert to tunneled line. Eliquis on hold for 24 hrs now, IR seeing and planning for the procedure tomorrow.  Hypertension/volume: BP stable, on midodrine 1048mID with significant overload on exam. Need to ^ UFG with HD as tolerated, can give IV albumin prn. Nutrition: Albumin 1.7. Getting TFs via NG tube. 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  Anemia: Hgb down to 9.1, continue Aranesp 150m17m Monday.  Dispo: DNR/DNI. Poor prognosis. Palliative care has seen see #1. Appreciate their involvement. We do not believe dialysis is adding to her quality of life. Per family communication, family wishes to continue. Have asked primary team that family members sit with patient during HD to see the reality of situation and to hopefully console her as she cries entire treatment, asking for help.   Rob Kelly Splinter 07/01/2021, 4:50 PM

## 2021-07-02 ENCOUNTER — Inpatient Hospital Stay (HOSPITAL_COMMUNITY): Payer: Medicare Other

## 2021-07-02 DIAGNOSIS — R627 Adult failure to thrive: Secondary | ICD-10-CM | POA: Diagnosis not present

## 2021-07-02 DIAGNOSIS — I6330 Cerebral infarction due to thrombosis of unspecified cerebral artery: Secondary | ICD-10-CM | POA: Diagnosis not present

## 2021-07-02 DIAGNOSIS — N186 End stage renal disease: Secondary | ICD-10-CM | POA: Diagnosis not present

## 2021-07-02 DIAGNOSIS — K922 Gastrointestinal hemorrhage, unspecified: Secondary | ICD-10-CM | POA: Diagnosis not present

## 2021-07-02 HISTORY — PX: IR FLUORO GUIDE CV LINE LEFT: IMG2282

## 2021-07-02 LAB — GLUCOSE, CAPILLARY
Glucose-Capillary: 125 mg/dL — ABNORMAL HIGH (ref 70–99)
Glucose-Capillary: 128 mg/dL — ABNORMAL HIGH (ref 70–99)
Glucose-Capillary: 129 mg/dL — ABNORMAL HIGH (ref 70–99)
Glucose-Capillary: 151 mg/dL — ABNORMAL HIGH (ref 70–99)
Glucose-Capillary: 180 mg/dL — ABNORMAL HIGH (ref 70–99)
Glucose-Capillary: 189 mg/dL — ABNORMAL HIGH (ref 70–99)

## 2021-07-02 MED ORDER — FENTANYL CITRATE (PF) 100 MCG/2ML IJ SOLN
INTRAMUSCULAR | Status: AC | PRN
  Administered 2021-07-02: 25 ug via INTRAVENOUS

## 2021-07-02 MED ORDER — CEFAZOLIN SODIUM-DEXTROSE 2-4 GM/100ML-% IV SOLN
INTRAVENOUS | Status: AC | PRN
  Administered 2021-07-02: 2 g via INTRAVENOUS

## 2021-07-02 MED ORDER — SODIUM CHLORIDE 0.9 % IV SOLN
100.0000 mg | INTRAVENOUS | Status: DC
  Administered 2021-07-03 – 2021-07-17 (×7): 100 mg via INTRAVENOUS
  Filled 2021-07-02 (×14): qty 5

## 2021-07-02 MED ORDER — LIDOCAINE HCL (PF) 1 % IJ SOLN
INTRAMUSCULAR | Status: AC | PRN
Start: 2021-07-02 — End: 2021-07-02
  Administered 2021-07-02: 10 mL

## 2021-07-02 MED ORDER — MIDAZOLAM HCL 2 MG/2ML IJ SOLN
INTRAMUSCULAR | Status: AC | PRN
  Administered 2021-07-02: 0.5 mg via INTRAVENOUS

## 2021-07-02 MED ORDER — DARBEPOETIN ALFA 150 MCG/0.3ML IJ SOSY
150.0000 ug | PREFILLED_SYRINGE | INTRAMUSCULAR | Status: DC
  Administered 2021-07-03: 150 ug via INTRAVENOUS
  Filled 2021-07-02: qty 0.3

## 2021-07-02 MED ORDER — HEPARIN SODIUM (PORCINE) 1000 UNIT/ML IJ SOLN
INTRAMUSCULAR | Status: AC | PRN
Start: 2021-07-02 — End: 2021-07-02
  Administered 2021-07-02: 3.8 mL

## 2021-07-02 MED ORDER — CEFAZOLIN SODIUM-DEXTROSE 2-4 GM/100ML-% IV SOLN
INTRAVENOUS | Status: AC
  Filled 2021-07-02: qty 100

## 2021-07-02 MED ORDER — LIDOCAINE HCL (PF) 1 % IJ SOLN
INTRAMUSCULAR | Status: AC
  Filled 2021-07-02: qty 30

## 2021-07-02 MED ORDER — HEPARIN SODIUM (PORCINE) 1000 UNIT/ML IJ SOLN
INTRAMUSCULAR | Status: AC
  Filled 2021-07-02: qty 1

## 2021-07-02 MED ORDER — FENTANYL CITRATE (PF) 100 MCG/2ML IJ SOLN
INTRAMUSCULAR | Status: AC
  Filled 2021-07-02: qty 2

## 2021-07-02 MED ORDER — MIDAZOLAM HCL 2 MG/2ML IJ SOLN
INTRAMUSCULAR | Status: AC
  Filled 2021-07-02: qty 2

## 2021-07-02 NOTE — Progress Notes (Addendum)
Patient sat in the chair from 1330pm to 1545pm

## 2021-07-02 NOTE — Progress Notes (Signed)
[facility] Progress Note   Subjective:    Patient seen and examined at bedside today. Tolerated yesterday's HD with net UF 3.5L. Plan for HD 9/8.  Objective Vitals:   07/02/21 1205 07/02/21 1210 07/02/21 1225 07/02/21 1246  BP: (!) 154/60 (!) 163/69 (!) 162/75 (!) 107/44  Pulse: 89 88 88 88  Resp: _0 Temp:      TempSrc:      SpO2: 100% 100% 100% 100%  Weight:      Height:       Physical Exam General: Chronically ill woman; NAD; NGT in place. Heart: S1 and S2; No murmurs, gallops, or rubs Lungs: Clear anteriorly; No wheezing, rales, or rhonchi Abdomen: Soft and non-tender Extremities: 2+ edema BUE and 2+ BL hips Dialysis Access: L Non-tunneled catheter  Filed Weights   07/01/21 0814 07/01/21 1230 07/01/21 2044  Weight: 105.4 kg 102 kg 102 kg    Intake/Output Summary (Last 24 hours) at 07/02/2021 1642 Last data filed at 07/02/2021 1400 Gross per 24 hour  Intake 910.83 ml  Output 0 ml  Net 910.83 ml    Additional Objective Labs: Basic Metabolic Panel: Recent Labs  Lab 06/28/21 0249 06/29/21 0140 07/01/21 0647  NA 132* 131* 130*  K 5.9* 5.2* 4.6  CL 95* 95* 92*  CO2 _1 GLUCOSE 82 82 90  BUN 28* 33* 48*  CREATININE 3.40* 3.98* 4.88*  CALCIUM 8.0* 8.0* 8.3*  PHOS 2.7 3.3 3.7   Liver Function Tests: Recent Labs  Lab 06/28/21 0249 06/29/21 0140 07/01/21 0647  ALBUMIN 1.9* 1.8* 1.8*   No results for input(s): LIPASE, AMYLASE in the last 168 hours. CBC: Recent Labs  Lab 06/27/21 1332 06/29/21 0140 07/01/21 0808  WBC 11.4* 13.2* 12.9*  NEUTROABS 8.9* 10.0*  --   HGB 9.4* 9.1* 9.1*  HCT 31.0* 29.5* 28.8*  MCV 97.5 98.3 95.4  PLT 420* 307 336   Blood Culture    Component Value Date/Time   SDES BLOOD RIGHT ANTECUBITAL 06/26/2021 1234   SPECREQUEST  06/26/2021 1234    BOTTLES DRAWN AEROBIC AND ANAEROBIC Blood Culture adequate volume   CULT  06/26/2021 1234    NO GROWTH 5 DAYS Performed at [facility],  [place] [address], [place], [place] [address]    REPTSTATUS 07/01/2021 FINAL 06/26/2021 1234    Cardiac Enzymes: No results for input(s): CKTOTAL, CKMB, CKMBINDEX, TROPONINI in the last 168 hours. CBG: Recent Labs  Lab 07/01/21 2350 07/02/21 0346 07/02/21 0807 07/02/21 1259 07/02/21 1614  GLUCAP 180* 129* 128* 125* 151*   Iron Studies: No results for input(s): IRON, TIBC, TRANSFERRIN, FERRITIN in the last 72 hours. Lab Results  Component Value Date   INR 0.9 03/10/2021   INR 1.0 01/26/2021   Studies/Results: IR Fluoro Guide CV Line Left  Result Date: 07/02/2021 INDICATION: ESRD requiring HD. EXAM: FLUOROSCOPIC GUIDED CONVERSION OF NON TUNNELED TO TUNNELED HEMODIALYSIS CATHETER COMPARISON:  IR fluoroscopy, 06/27/2021. MEDICATIONS: 2g Ancef IV. ANESTHESIA/SEDATION: Moderate (conscious) sedation was employed during this procedure. A total of Versed 0.5 mg and Fentanyl 25 mcg was administered intravenously. Moderate Sedation Time: 20 minutes. The patient's level of consciousness and vital signs were monitored continuously by radiology nursing throughout the procedure under my direct supervision. FLUOROSCOPY TIME:  2 minutes 0 seconds (5 mGy) COMPLICATIONS: None immediate. PROCEDURE: Informed written consent was obtained from patient and/or patient's representative after a discussion of the risks, benefits, and alternatives to treatment. Questions regarding the procedure were  encouraged and answered. The skin and external portion of the existing hemodialysis catheter was prepped with chlorhexidine in a sterile fashion, and a sterile drape was applied covering the operative field. Maximum barrier sterile technique with sterile gowns and gloves were used for the procedure. A timeout was performed prior to the initiation of the procedure. A lumen of the non tunneled temporary dialysis catheter was cannulated with a stiff Glidewire and utilized for measurement purposes. Next, the stiff Glidewire was advanced  to the level of the IVC. A 23 cm tip to cuff tunneled dialysis catheter was tunneled from a site along the anterior chest to the venotomy site. Under intermittent fluoroscopic guidance, the temporary dialysis catheter was exchanged for a peel-away sheath. The tunneled dialysis catheter was inserted into the peel-away sheath with tips ultimately terminating within the superior aspect of the right atrium. Final catheter positioning was confirmed and documented with a spot fluoroscopic image. The catheter aspirates and flushes normally. The catheter was flushed with appropriate volume heparin dwells. The catheter exit site was secured with a 2-0 nylon retention sutures. The venotomy site was apposed with Dermabond and Steri-Strips. Both lumens were heparinized. A dressing was applied. The patient tolerated the procedure well without immediate post procedural complication. IMPRESSION: Successful conversion of a temporary to a permanent 23 cm tip to cuff tunneled hemodialysis catheter, as above. The new tunneled dialysis catheter is ready for immediate use. Michaelle Birks, MD [facility] Radiology Electronically Signed   By: Michaelle Birks M.D.   On: 07/02/2021 14:23    Medications:  sodium chloride     ceFAZolin     feeding supplement (OSMOLITE 1.2 CAL) 1,000 mL (07/02/21 1304)   [START ON 07/03/2021] iron sucrose      atorvastatin  40 mg Per Tube Daily   Chlorhexidine Gluconate Cloth  6 each Topical Daily   collagenase   Topical Daily   [START ON 07/03/2021] darbepoetin (ARANESP) injection - DIALYSIS  150 mcg Intravenous Q Thu-HD   feeding supplement (PROSource TF)  45 mL Per Tube Daily   fentaNYL       gabapentin  100 mg Per Tube Q12H   heparin sodium (porcine)       insulin aspart  0-6 Units Subcutaneous Q4H   lactulose  20 g Per Tube Daily   lidocaine (PF)       metaxalone  400 mg Per Tube TID   midazolam       midodrine  10 mg Per Tube TID WC    mirtazapine  7.5 mg Per Tube QHS   multivitamin  1 tablet Per Tube QHS   pantoprazole sodium  40 mg Per Tube Daily   sodium chloride flush  10-40 mL Intracatheter Q12H   sodium chloride flush  3 mL Intravenous Q12H   thiamine  100 mg Per Tube Daily   venlafaxine  37.5 mg Per Tube BID    Dialysis Orders: AF MWF  3h 71min 400/500    62.5kg    3K/2.5 Ca    P2  AVG HeRO   Hep none - Mircera 50 q 2 wks  (7/13)  Venofer 50 q wk - Parsabiv $RemoveBe'5mg'SpKUlqgdV$  IV q HD  Assessment/Plan: AMS/FTT: Providers and palliative care met w/ family and discussed pt's decline. Due to declining mental and physical status we don't anticipate she will be able to do OP dialysis.  Pt will have to be able to sit in a chair for 4-6 hrs to qualify for  OP HD w/ our group. If she is able to sit for dialysis and cooperate, then the next step would to re-CLIP the patient as her OP HD unit will not take her back.  Melena/Rectal bleeding - GI consulted. Hx rectal ulcers.  EGD 7/27 normal, flex sig with mucosal ulceration c/w sterocoral ulcers, non bleeding. Hgb 9.1, trending down again. ESRD: usual schedule MWF. Received HD yesterday d/t high census on HD unit. Tolerated yesterday's HD with net UF 3.5L. Will keep on TTS for now.  Dysphagia-Informed by primary care team today: Per surgical team, patient needs to tolerate being in the chair during HD in order to be considered for G-tube. Plan to place patient in chair for scheduled HD tomorrow. Access Issues/AVG clotted: Had f'gram 6/23 which showed chronic thrombus and was started on Eliquis. AVG clotted 9/2, unable to be declotted d/t arm contracture. S/P conversion to tunneled HD catheter today via IR. Hypertension/volume: BP stable, on midodrine 54m TID with significant overload on exam. Need to ^ UFG with HD as tolerated, can give IV albumin prn. Nutrition: Albumin 1.7. Getting TFs via NG tube. 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  Anemia: Hgb down to 9.1, continue Aranesp  1579m q Monday.  Dispo: DNR/DNI. Poor prognosis. Palliative care has seen see #1. Appreciate their involvement. We do not believe dialysis is adding to her quality of life. Per family communication, family wishes to continue. Have asked primary team that family members sit with patient during HD to see the reality of situation and to hopefully console her as she cries entire treatment, asking for help.   CoTobie PoetNP [facility] 07/02/2021,4:42 PM  LOS: 43 days

## 2021-07-02 NOTE — Sedation Documentation (Signed)
First sedation medication given

## 2021-07-02 NOTE — Progress Notes (Signed)
[facility] PROGRESS NOTE  Amy [name] [id number] DOB: 03-22-1946 DOA: 05/19/2021 PCP: [name], MD  Status:  Remains inpatient appropriate because:Unsafe d/c plan, IV treatments appropriate due to intensity of illness or inability to take PO, and Inpatient level of care appropriate due to severity of illness  Dispo: The patient is from: Home              Anticipated d/c is to: SNF              Patient currently is medically stable to d/c.   Difficult to place patient Yes              Barriers to discharge: Dialysis patient with markedly decreased mobility-we will need to prove tolerance of sitting in chair for hemodialysis while here as well as tolerating sitting in wheelchair for up to 1 hour x 2 to replicate transport process.  Cannot discharge and proceed with outpatient hemodialysis unless this is accomplished.    Level of care: Med-Surg  Code Status: DNR Family Communication:  DVT prophylaxis: Eliquis currently on hold secondary to need for placement of [place] vaccination status: [name] 02/26/2020, Pfizer 04/10/2021    HPI: 75 y.o. 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  Patient presented to the ED on 7/25 with dark stool.  On presentation, hemoglobin was 9.6; GI was consulted.  She underwent EGD which was normal and flexible sigmoidoscopy which showed nonbleeding distal rectal ulcers/stercoral ulcers.  Subsequently, GI signed off.  Neurology was also consulted for altered mental status; MRI brain was similar to prior MRI at [facility] with a large right MCA stroke.   Nephrology has been following for dialysis needs; nephrology is stating that  patient will not be accepted back to outpatient dialysis at this time due to her underlying confusion, weakness and inability to sit in a dialysis recliner as an outpatient.   Due to her poor oral intake, family wanted a PEG tube placement.  IR tried but was unable to place because of overlying transverse colon.   General surgery was consulted for G-tube placement but family currently undecided.  She has an NG tube in place.   Palliative care following for goals of care discussion.  Subjective: Sleeping soundly and briefly awakened during exam.  Objective: Vitals:   07/01/21 2044 07/02/21 0513  BP: (!) 134/59 (!) 167/59  Pulse: 94 95  Resp: 16 19  Temp: 98.3 F (36.8 C) 97.6 F (36.4 C)  SpO2: 99% 95%    Intake/Output Summary (Last 24 hours) at 07/02/2021 0758 Last data filed at 07/02/2021 0000 Gross per 24 hour  Intake 710.83 ml  Output 3500 ml  Net -2789.17 ml   Filed Weights   07/01/21 0814 07/01/21 1230 07/01/21 2044  Weight: 105.4 kg 102 kg 102 kg    Exam:  Constitutional: NAD, calm Respiratory: Sounds clear to auscultation, no increased work of breathing, normal pulse oximetry reading Cardiovascular: S1 S2, normotensive, chronic peripheral edema, regular pulse Abdomen: no tenderness, Bowel sounds positive. LBM 9/06-cortrack for feeding-extremely poor oral intake Neurologic: Sleeping but at baseline CN 2-12 grossly intact -Strength 3/5 on the right. 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 Gastroenterology now signed off  -Hemoglobin stable (7-8).     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  Merrily consistent with encephalopathy Lipid panel and hemoglobin A1c within normal limits.  Continue Lipitor Diet as per SLP recommendations.  Extremely poor to no oral intake Cortrak tube in place for enteral feedings Has not eaten any oral food for the past 4 days therefore will resume continuous tube feedings. Discussed with surgical team.  Patient needs to prove tolerance of HD in chair before consideration for open G-tube noting this would be a high risk procedure.  I have asked staff to document when patient up for dialysis since and whether she tolerates or has to be placed back in bed    ESRD on HD Per Nephro Patient is not a candidate for continued outpatient HD given her confusion, weakness and inability to sit up in a chair for HD outpatient.  On 9/6 I examined patient during dialysis noting she was in the bed and not up in chair.  New order written and communication to nursing staff regarding necessity for chair dialysis Nephrology suggested transition to hospice care but family resistant.  PMT spoke with son by phone recently and he insisted that as long as his mother could speak that she would remain full aggressive care.. Continue Neurontin and Oxy IR for suspected pain during dialysis TDC planned this week due to occluded access.  Eliquis on hold  Hyperkalemia/hypophosphatemia Management per nephrology HD -supplementation as needed   Hypotension Blood pressure currently is stable on midodrine.   Failure to thrive /Generalized deconditioning  /very poor oral intake PEG tube was desired by family but could not be placed because of overlying transverse colon.   Tube feeding on hold as above in favor of attempting to feed orally with Glucerna shakes supplement; unfortunately patient continues with no oral intake.  Likely will need to resume continuous tube  feedings on 9/6 Discussed with general surgery on 8/31 and if patient can tolerate dialysis in chair and plan is to discuss possible open G-tube placed  Constipation No further abdominal pain and recent films without evidence of constipation Patient was on chronic narcotics prior to admission and these remain in place Patient was on 40 g Chronulac daily prn.  Continue scheduled Chronulac 20 g daily and presentation with rectal ulcers likely secondary to constipation chronic  Type 2 diabetes mellitus Episodes of hypoglycemia Hemoglobin A1c 5.1.  Diet controlled at baseline  Chronic thrombus left upper extremity Continue Eliquis Elevate as appropriate  Stage II mid coccyx pressure injury, present on admission Continue wound care    GERD Could also be contributing to patient's poor oral intake Increased frequency of Protonix to BID sitter adding Pepcid     Data Reviewed: Basic Metabolic Panel: Recent Labs  Lab 06/25/21 1241 06/27/21 1332 06/28/21 0249 06/29/21 0140 07/01/21 0647  NA 128* 133* 132* 131* 130*  K 5.7* 6.3* 5.9* 5.2* 4.6  CL 92* 94* 95* 95* 92*  CO2 '27 28 26 28 25  '$ GLUCOSE 99 110* 82 82 90  BUN 36* 36* 28* 33* 48*  CREATININE 3.97* 4.00* 3.40* 3.98*  4.88*  CALCIUM 7.9* 8.1* 8.0* 8.0* 8.3*  PHOS 3.7 2.7 2.7 3.3 3.7   Liver Function Tests: Recent Labs  Lab 06/25/21 1241 06/27/21 1332 06/28/21 0249 06/29/21 0140 07/01/21 0647  ALBUMIN 1.7* 1.9* 1.9* 1.8* 1.8*   No results for input(s): LIPASE, AMYLASE in the last 168 hours. No results for input(s): AMMONIA in the last 168 hours. CBC: Recent Labs  Lab 06/27/21 1332 06/29/21 0140 07/01/21 0808  WBC 11.4* 13.2* 12.9*  NEUTROABS 8.9* 10.0*  --   HGB 9.4* 9.1* 9.1*  HCT 31.0* 29.5* 28.8*  MCV 97.5 98.3 95.4  PLT 420* 307 336    CBG: Recent Labs  Lab 07/01/21 1345 07/01/21 1628 07/01/21 2043 07/01/21 2350 07/02/21 0346  GLUCAP 88 149* 168* 180* 129*     Scheduled Meds:  atorvastatin   40 mg Per Tube Daily   Chlorhexidine Gluconate Cloth  6 each Topical Daily   collagenase   Topical Daily   darbepoetin (ARANESP) injection - DIALYSIS  150 mcg Intravenous Q Mon-HD   feeding supplement (PROSource TF)  45 mL Per Tube Daily   gabapentin  100 mg Per Tube Q12H   insulin aspart  0-6 Units Subcutaneous Q4H   lactulose  20 g Per Tube Daily   metaxalone  400 mg Per Tube TID   midodrine  10 mg Per Tube TID WC   mirtazapine  7.5 mg Per Tube QHS   multivitamin  1 tablet Per Tube QHS   pantoprazole sodium  40 mg Per Tube Daily   sodium chloride flush  10-40 mL Intracatheter Q12H   sodium chloride flush  3 mL Intravenous Q12H   thiamine  100 mg Per Tube Daily   venlafaxine  37.5 mg Per Tube BID   Continuous Infusions:  sodium chloride      ceFAZolin (ANCEF) IV     feeding supplement (OSMOLITE 1.2 CAL) 1,000 mL (07/01/21 1347)   iron sucrose Stopped (06/30/21 1416)    Principal Problem:   GI bleed Active Problems:   Essential hypertension   Type 2 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, with long-term current use of insulin (HCC)   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   ESRD on dialysis ([place])   Dyslipidemia   Hypokalemia   Stage 2 skin ulcer of sacral region ([place])   Cerebral thrombosis with cerebral infarction   Protein-calorie malnutrition, severe   Acute GI bleeding   Oropharyngeal dysphagia   Failure to thrive in adult   Inadequate oral intake   Clogged feeding tube   Fever   Consultants: Vascular surgery Nephrology Gastroenterology Neurology Palliative medicine General surgery  Procedures: EEG Echocardiogram Cortrack EGD  Antibiotics: Ceftriaxone 8/3 through 8/5   Time spent: 25 minutes    Erin Hearing ANP  [facility] 7 am - 330 pm/M-F for direct patient care and secure chat Please refer to Amion for contact info 43  days

## 2021-07-02 NOTE — Procedures (Signed)
Vascular and Interventional Radiology Procedure Note  Patient: Amy [name] DOB: 12/29/1945 Medical Record Number: [id number] Note Date/Time: 07/02/21 12:34 PM   Performing Physician: Michaelle Birks, MD Assistant(s): None  Diagnosis: ESRD requiring Hemodialysis  Procedure: TUNNELED HEMODIALYSIS CATHETER PLACEMENT  Anesthesia: Conscious Sedation Complications: None Estimated Blood Loss: Minimal Specimens:  None  Findings:  Successful placement of left-sided, 23 cm (tip-to-cuff), tunneled hemodialysis catheter with the tip of the catheter in the proximal right atrium.  Plan: Catheter ready for use.  See detailed procedure note with images in PACS. The patient tolerated the procedure well without incident or complication and was returned to Floor Bed in stable condition.    Michaelle Birks, MD Vascular [facility] Interventional Radiology Specialists [facility] Radiology   Pager. [employer]

## 2021-07-03 DIAGNOSIS — K922 Gastrointestinal hemorrhage, unspecified: Secondary | ICD-10-CM | POA: Diagnosis not present

## 2021-07-03 DIAGNOSIS — D638 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: Secondary | ICD-10-CM | POA: Diagnosis not present

## 2021-07-03 DIAGNOSIS — R627 Adult failure to thrive: Secondary | ICD-10-CM | POA: Diagnosis not present

## 2021-07-03 DIAGNOSIS — I6330 Cerebral infarction due to thrombosis of unspecified cerebral artery: Secondary | ICD-10-CM | POA: Diagnosis not present

## 2021-07-03 LAB — GLUCOSE, CAPILLARY
Glucose-Capillary: 209 mg/dL — ABNORMAL HIGH (ref 70–99)
Glucose-Capillary: 155 mg/dL — ABNORMAL HIGH (ref 70–99)
Glucose-Capillary: 61 mg/dL — ABNORMAL LOW (ref 70–99)
Glucose-Capillary: 58 mg/dL — ABNORMAL LOW (ref 70–99)
Glucose-Capillary: 62 mg/dL — ABNORMAL LOW (ref 70–99)
Glucose-Capillary: 68 mg/dL — ABNORMAL LOW (ref 70–99)
Glucose-Capillary: 83 mg/dL (ref 70–99)
Glucose-Capillary: 94 mg/dL (ref 70–99)

## 2021-07-03 MED ORDER — HEPARIN SODIUM (PORCINE) 1000 UNIT/ML IJ SOLN
INTRAMUSCULAR | Status: AC
  Administered 2021-07-03: 1000 [IU]
  Filled 2021-07-03: qty 4

## 2021-07-03 MED ORDER — DEXTROSE 50 % IV SOLN
INTRAVENOUS | Status: AC
  Filled 2021-07-03: qty 50

## 2021-07-03 MED ORDER — SODIUM CHLORIDE 0.9 % IV SOLN: 100.0000 mL | INTRAVENOUS | Status: DC | PRN

## 2021-07-03 MED ORDER — GLUCAGON HCL RDNA (DIAGNOSTIC) 1 MG IJ SOLR
1.0000 mg | INTRAMUSCULAR | Status: AC
  Administered 2021-07-03: 1 mg via INTRAMUSCULAR
  Filled 2021-07-03: qty 1

## 2021-07-03 MED ORDER — LIDOCAINE-PRILOCAINE 2.5-2.5 % EX CREA: 1.0000 "application " | TOPICAL_CREAM | CUTANEOUS | Status: DC | PRN

## 2021-07-03 MED ORDER — ALTEPLASE 2 MG IJ SOLR: 2.0000 mg | Freq: Once | INTRAMUSCULAR | Status: DC | PRN

## 2021-07-03 MED ORDER — HEPARIN SODIUM (PORCINE) 1000 UNIT/ML DIALYSIS: 1000.0000 [IU] | INTRAMUSCULAR | Status: DC | PRN

## 2021-07-03 MED ORDER — LIDOCAINE HCL (PF) 1 % IJ SOLN: 5.0000 mL | INTRAMUSCULAR | Status: DC | PRN

## 2021-07-03 MED ORDER — PENTAFLUOROPROP-TETRAFLUOROETH EX AERO: 1.0000 "application " | INHALATION_SPRAY | CUTANEOUS | Status: DC | PRN

## 2021-07-03 NOTE — Progress Notes (Signed)
Pt arrived back to the unit from hemodialysis. Came to room the room to check pt back in, feeding pump beeping. Tried to flush restart tube and give meds. Tube was clogged. Got tube unclogged and gave meds.

## 2021-07-03 NOTE — Progress Notes (Signed)
[facility] Progress Note   Subjective:    Patient seen and examined during HD. In chair and currently tolerating. No acute changes.  Objective Vitals:   07/03/21 1200 07/03/21 1230 07/03/21 1300 07/03/21 1315  BP: (!) 90/50 109/83 (!) 115/50 129/62  Pulse: (!) 108 (!) 105 (!) 107 (!) 102  Resp: 15 14 (!) 22   Temp:   98.4 F (36.9 C)   TempSrc:   Oral   SpO2: 100% 100% 100%   Weight:      Height:       Physical Exam General: Chronically ill woman; NAD; NGT in place. Heart: S1 and S2; No murmurs, gallops, or rubs Lungs: Clear anteriorly; No wheezing, rales, or rhonchi Abdomen: Soft and non-tender Extremities: 2+ edema BUE and 2+ BL hips Dialysis Access: L Non-tunneled catheter  Filed Weights   07/01/21 1230 07/01/21 2044 07/02/21 2110  Weight: 102 kg 102 kg 102 kg    Intake/Output Summary (Last 24 hours) at 07/03/2021 1403 Last data filed at 07/03/2021 1300 Gross per 24 hour  Intake 830 ml  Output 2500 ml  Net -1670 ml    Additional Objective Labs: Basic Metabolic Panel: Recent Labs  Lab 06/28/21 0249 06/29/21 0140 07/01/21 0647  NA 132* 131* 130*  K 5.9* 5.2* 4.6  CL 95* 95* 92*  CO2 _0 GLUCOSE 82 82 90  BUN 28* 33* 48*  CREATININE 3.40* 3.98* 4.88*  CALCIUM 8.0* 8.0* 8.3*  PHOS 2.7 3.3 3.7   Liver Function Tests: Recent Labs  Lab 06/28/21 0249 06/29/21 0140 07/01/21 0647  ALBUMIN 1.9* 1.8* 1.8*   No results for input(s): LIPASE, AMYLASE in the last 168 hours. CBC: Recent Labs  Lab 06/27/21 1332 06/29/21 0140 07/01/21 0808  WBC 11.4* 13.2* 12.9*  NEUTROABS 8.9* 10.0*  --   HGB 9.4* 9.1* 9.1*  HCT 31.0* 29.5* 28.8*  MCV 97.5 98.3 95.4  PLT 420* 307 336   Blood Culture    Component Value Date/Time   SDES BLOOD RIGHT ANTECUBITAL 06/26/2021 1234   SPECREQUEST  06/26/2021 1234    BOTTLES DRAWN AEROBIC AND ANAEROBIC Blood Culture adequate volume   CULT  06/26/2021 1234    NO GROWTH 5 DAYS Performed at [facility], [place] [address], [place], [place] [address]    REPTSTATUS 07/01/2021 FINAL 06/26/2021 1234    Cardiac Enzymes: No results for input(s): CKTOTAL, CKMB, CKMBINDEX, TROPONINI in the last 168 hours. CBG: Recent Labs  Lab 07/02/21 2111 07/02/21 2356 07/03/21 0449 07/03/21 0821 07/03/21 1354  GLUCAP 180* 189* 209* 155* 61*   Iron Studies: No results for input(s): IRON, TIBC, TRANSFERRIN, FERRITIN in the last 72 hours. Lab Results  Component Value Date   INR 0.9 03/10/2021   INR 1.0 01/26/2021   Studies/Results: IR Fluoro Guide CV Line Left  Result Date: 07/02/2021 INDICATION: ESRD requiring HD. EXAM: FLUOROSCOPIC GUIDED CONVERSION OF NON TUNNELED TO TUNNELED HEMODIALYSIS CATHETER COMPARISON:  IR fluoroscopy, 06/27/2021. MEDICATIONS: 2g Ancef IV. ANESTHESIA/SEDATION: Moderate (conscious) sedation was employed during this procedure. A total of Versed 0.5 mg and Fentanyl 25 mcg was administered intravenously. Moderate Sedation Time: 20 minutes. The patient's level of consciousness and vital signs were monitored continuously by radiology nursing throughout the procedure under my direct supervision. FLUOROSCOPY TIME:  2 minutes 0 seconds (5 mGy) COMPLICATIONS: None immediate. PROCEDURE: Informed written consent was obtained from patient and/or patient's representative after a discussion of the risks, benefits, and alternatives to treatment. Questions regarding the  procedure were encouraged and answered. The skin and external portion of the existing hemodialysis catheter was prepped with chlorhexidine in a sterile fashion, and a sterile drape was applied covering the operative field. Maximum barrier sterile technique with sterile gowns and gloves were used for the procedure. A timeout was performed prior to the initiation of the procedure. A lumen of the non tunneled temporary dialysis catheter was cannulated with a stiff Glidewire and utilized for measurement purposes. Next, the stiff Glidewire  was advanced to the level of the IVC. A 23 cm tip to cuff tunneled dialysis catheter was tunneled from a site along the anterior chest to the venotomy site. Under intermittent fluoroscopic guidance, the temporary dialysis catheter was exchanged for a peel-away sheath. The tunneled dialysis catheter was inserted into the peel-away sheath with tips ultimately terminating within the superior aspect of the right atrium. Final catheter positioning was confirmed and documented with a spot fluoroscopic image. The catheter aspirates and flushes normally. The catheter was flushed with appropriate volume heparin dwells. The catheter exit site was secured with a 2-0 nylon retention sutures. The venotomy site was apposed with Dermabond and Steri-Strips. Both lumens were heparinized. A dressing was applied. The patient tolerated the procedure well without immediate post procedural complication. IMPRESSION: Successful conversion of a temporary to a permanent 23 cm tip to cuff tunneled hemodialysis catheter, as above. The new tunneled dialysis catheter is ready for immediate use. Michaelle Birks, MD Vascular and Interventional Radiology Specialists [facility] Radiology Electronically Signed   By: Michaelle Birks M.D.   On: 07/02/2021 14:23    Medications:  sodium chloride     feeding supplement (OSMOLITE 1.2 CAL) 1,000 mL (07/02/21 1304)   iron sucrose Stopped (07/03/21 1300)    atorvastatin  40 mg Per Tube Daily   Chlorhexidine Gluconate Cloth  6 each Topical Daily   collagenase   Topical Daily   darbepoetin (ARANESP) injection - DIALYSIS  150 mcg Intravenous Q Thu-HD   feeding supplement (PROSource TF)  45 mL Per Tube Daily   gabapentin  100 mg Per Tube Q12H   insulin aspart  0-6 Units Subcutaneous Q4H   lactulose  20 g Per Tube Daily   metaxalone  400 mg Per Tube TID   midodrine  10 mg Per Tube TID WC   mirtazapine  7.5 mg Per Tube QHS   multivitamin  1 tablet Per Tube QHS   pantoprazole sodium  40 mg Per Tube Daily    sodium chloride flush  10-40 mL Intracatheter Q12H   sodium chloride flush  3 mL Intravenous Q12H   thiamine  100 mg Per Tube Daily   venlafaxine  37.5 mg Per Tube BID    Dialysis Orders: AF MWF  3h 29min 400/500    62.5kg    3K/2.5 Ca    P2  AVG HeRO   Hep none - Mircera 50 q 2 wks  (7/13)  Venofer 50 q wk - Parsabiv $RemoveBe'5mg'[id number]$  IV q HD  Assessment/Plan: AMS/FTT: Providers and palliative care met w/ family and discussed pt's decline. Due to declining mental and physical status we don't anticipate she will be able to do OP dialysis.  Pt will have to be able to sit in a chair for 4-6 hrs to qualify for OP HD w/ our group. Patient in the chair for HD today and currently tolerating and cooperating. Plan to watch her closely. If she continues to tolerate sitting in the chair for dialysis and cooperate, then will re-CLIP the  patient as her OP HD unit will not take her back.  Melena/Rectal bleeding - GI consulted. Hx rectal ulcers.  EGD 7/27 normal, flex sig with mucosal ulceration c/w sterocoral ulcers, non bleeding. Hgb 9.1, trending down again. ESRD: usual schedule MWF. No on TTS while here-Plan for HD 9/10.  Dysphagia-Patient tolerating being in the chair during HD today. Pending G-tube placement via surgery.  Access Issues/AVG clotted: Had f'gram 6/23 which showed chronic thrombus and was started on Eliquis. AVG clotted 9/2, unable to be declotted d/t arm contracture. S/P conversion to tunneled HD catheter 9/7 via IR. Hypertension/volume: BP stable, on midodrine 77m TID with significant overload on exam. Need to ^ UFG with HD as tolerated, can give IV albumin prn. Nutrition: Albumin 1.7. Getting TFs via NG tube. 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  Anemia: Hgb down to 9.1, continue Aranesp 1582m q Monday.  Dispo: DNR/DNI. Poor prognosis. Palliative care has seen see #1. Appreciate their involvement. Although patient is currently tolerating being in the chair for HD, we do not believe  dialysis is adding to her quality of life. Per family communication, family wishes to continue. Have asked primary team that family members sit with patient during HD to see the reality of situation and to hopefully console her as she cries entire treatment, asking for help.   CoTobie PoetNP [facility] 07/03/2021,2:03 PM  LOS: 44 days

## 2021-07-03 NOTE — TOC Progression Note (Signed)
Transition of Care [facility]) - Progression Note    Patient Details  Name: Amy [name] MRN: [id number] Date of Birth: [birth year]  Transition of Care [facility]) CM/SW [place], RN Phone Number: 07/03/2021, 10:49 AM  Clinical Narrative:    CM and MSW with DTP Team will continue to follow the patient for transitions of care needs.  Patient has complex healthcare needs with palliative care continuing to communicate with the patient's son.   Expected Discharge Plan: [place] Barriers to Discharge: Continued Medical Work up  Expected Discharge Plan and Services Expected Discharge Plan: [place] In-house Referral: Clinical Social Work     Living arrangements for the past 2 months: [place] (Patient came to hospital from [place])                                       Social Determinants of Health (SDOH) Interventions    Readmission Risk Interventions No flowsheet data found.

## 2021-07-03 NOTE — Progress Notes (Signed)
Hypoglycemic Event  CBG: 61  Treatment: tube feed restarted  Follow-up CBG: Time:1409  CBG Result: 62  Treatment: Glucagon IM 1 mg  Symptoms: Sweaty  CBG Results: 58   CBG Results: 68  Treatment: Glucagon IM 1 mg  CBG Results : 83       Possible Reasons for Event: Other: core-track clogged  Comments/MD notified:hypoglycemic protocol followed    Amy [name]

## 2021-07-03 NOTE — Progress Notes (Signed)
Patient seen and examined this morning in dialysis unit.  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  She had altered mental status seen by neurology MRI similar to prior MRI at [facility] with a large right MCA stroke, seen by nephrology for dialysis need.  Patient having ongoing confusion weakness inability to sit in dialysis recliner and so not accepted back to outpatient dialysis at this time and awaiting further improvement.  She has had poor intake failure to thrive family wanted PEG tube I had tried but was unable to place because of overlying transverse colon and has been on core track NG tube feeding.  Seen by surgery for G-tube placement but family currently undecided and continues to have nutrition through core track NG tube  On exam she is alert, awake oriented to self, current hospital current president year but she thinks this is December Has no complaint. She is on recliner for dialysis  Issues  Acute on chronic lower GI bleeding from stercoral ulcer: Status post EGD and flex sigmoidoscopy by GI.  Hemoglobin overall stable.  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  Dysphagia in the setting of confusion and stroke currently on core track tube feeding.  Discussion undergoing for long-term enteral route.  IR unable to place PEG tube, surgery has been  consulted.  ESRD on HD MWF continue as per nephrology outpatient HD center unable to accept the patient due to confusion weakness and inability to sit up in the chair  Hypokalemia/hypophosphatemia per HD  Failure to thrive/generalized deconditioning, poor oral intake: PT OT, palliative care to continue.  Waiting for G-tube.  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  GERD on PPI  Stage II mid coccyx pressure injury POA continue wound care

## 2021-07-03 NOTE — Progress Notes (Signed)
[facility] PROGRESS NOTE  Amy [name] [id number] DOB: Mar 07, 1946 DOA: 05/19/2021 PCP: Amy Maw, MD  Status:  Remains inpatient appropriate because:Unsafe d/c plan, IV treatments appropriate due to intensity of illness or inability to take PO, and Inpatient level of care appropriate due to severity of illness  Dispo: The patient is from: Home              Anticipated d/c is to: SNF              Patient currently is medically stable to d/c.   Difficult to place patient Yes              Barriers to discharge: Dialysis patient with markedly decreased mobility-we will need to prove tolerance of sitting in chair for hemodialysis while here as well as tolerating sitting in wheelchair for up to 1 hour x 2 to replicate transport process.  Cannot discharge and proceed with outpatient hemodialysis unless this is accomplished.    Level of care: Med-Surg  Code Status: DNR Family Communication:  DVT prophylaxis: Eliquis currently on hold secondary to need for placement of [place] vaccination status: [name] 02/26/2020, Pfizer 04/10/2021    HPI: 75 y.o. 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  Patient presented to the ED on 7/25 with dark stool.  On presentation, hemoglobin was 9.6; GI was consulted.  She underwent EGD which was normal and flexible sigmoidoscopy which showed nonbleeding distal rectal ulcers/stercoral ulcers.  Subsequently, GI signed off.  Neurology was also consulted for altered mental status; MRI brain was similar to prior MRI at [name] with a large right MCA stroke.   Nephrology has been following for dialysis needs; nephrology is stating that  patient will not be accepted back to outpatient dialysis at this time due to her underlying confusion, weakness and inability to sit in a dialysis recliner as an outpatient.   Due to her poor oral intake, family wanted a PEG tube placement.  IR tried but was unable to place because of overlying transverse colon.   General surgery was consulted for G-tube placement but family currently undecided.  She has an NG tube in place.   Palliative care following for goals of care discussion.  Subjective: Sleeping soundly while up in chair for hemodialysis.  Objective: Vitals:   07/02/21 2110 07/03/21 0447  BP: (!) 142/90 138/66  Pulse: 83 91  Resp: 16 18  Temp:  (!) 97.4 F (36.3 C)  SpO2: 100% 100%    Intake/Output Summary (Last 24 hours) at 07/03/2021 0812 Last data filed at 07/03/2021 0600 Gross per 24 hour  Intake 1030 ml  Output 0 ml  Net 1030 ml   Filed Weights   07/01/21 1230 07/01/21 2044 07/02/21 2110  Weight: 102 kg 102 kg 102 kg    Exam:  Constitutional: NAD, calm, asleep Respiratory: Anterior lung sounds are clear to auscultation, she remains on room air.  No increased work of breathing.  Normal pulse oximetry readings. Cardiovascular: Normal heart sounds, remains normotensive, continues with chronic but stable peripheral edema primarily in the upper extremities. Abdomen: Soft and nondistended with normoactive bowel sounds.[name] LBM 9/06-cortrack for feeding-extremely poor oral intake Neurologic: Sleeping but at baseline CN 2-12 grossly intact -Strength 3/5  on the right. Chronic left hemiparesis from prior CVA with associated left-sided neglect Psychiatric: Sleeping   Assessment/Plan: Acute problems: Acute on chronic lower GI bleeding from stercoral ulcers -Patient presented with anemia,  underwent EGD which was normal and flex sigmoidoscopy with clean base, nonbleeding distal rectal ulcers (stercoral ulcers). Gastroenterology now signed off  -Hemoglobin stable (7-8).      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 Dysphagia MRI brain done in this admission similar in appearance to her prior MRI at [name] with no drastic changes EEG with no seizure activity or epileptiform discharges.  [name] consistent with encephalopathy Lipid panel and hemoglobin A1c within normal limits.  Continue Lipitor Diet as per SLP recommendations.  Extremely poor to no oral intake Cortrak tube in place for enteral feedings Has not eaten any oral food for the past 4 days therefore will resume continuous tube feedings. Discussed with surgical team.  Patient needs to prove tolerance of HD in chair before consideration for open G-tube noting this would be a high risk procedure.  I have asked staff to document when patient up for dialysis since and whether she tolerates or has to be placed back in bed   Fever, leukocytosis With T-max of 100 F with increase in WBCs to 12.9 over the past 24 hours.  It is suspected that this is secondary to recent placement of [name] patient had low-grade fever work-up several days ago that was negative.  ESRD on HD Per Nephro Patient is not a candidate for continued outpatient HD given her confusion, weakness and inability to sit up in a chair for HD outpatient. 9/8 patient tolerating HD in chair well noting she is sleeping. Nephrology suggested transition to hospice care but family resistant.  PMT spoke with son by phone recently and he insisted that as long as his mother could speak that she would remain full aggressive care.. Continue Neurontin and Oxy IR for suspected pain during dialysis TDC planned this week due to occluded access.  Eliquis on hold  Hyperkalemia/hypophosphatemia Management per nephrology HD -supplementation as needed   Hypotension Blood pressure currently is stable on midodrine.   Failure to thrive /Generalized deconditioning  /very poor oral intake PEG tube was desired by family but could not be placed  because of overlying transverse colon.   Patient did not eat anything over the 4 days her tube feeding was held therefore tube feeding resumed at previous rate on 9/5 Discussed with general surgery on 8/31 and if patient can tolerate dialysis in chair and plan is to discuss possible open G-tube placed  Constipation Patient was on chronic narcotics prior to admission and these remain in place Patient was on 40 g Chronulac daily prn.  Continue scheduled Chronulac 20 g daily and presentation with rectal ulcers likely secondary to constipation chronic  Type 2 diabetes mellitus Episodes of hypoglycemia Hemoglobin A1c 5.1.  Diet controlled at baseline  Chronic thrombus left upper extremity Continue Eliquis Elevate as appropriate  Stage II mid coccyx pressure injury, present on admission Continue wound care    GERD Could also be contributing to patient's poor oral intake Increased frequency of Protonix to BID sitter adding Pepcid     Data Reviewed: Basic Metabolic Panel: Recent Labs  Lab 06/27/21 1332 06/28/21 0249 06/29/21 0140 07/01/21 0647  NA 133* 132* 131* 130*  K 6.3* 5.9* 5.2* 4.6  CL 94* 95* 95* 92*  CO2 '28 26 28 25  '$ GLUCOSE 110* 82 82 90  BUN 36*  28* 33* 48*  CREATININE 4.00* 3.40* 3.98* 4.88*  CALCIUM 8.1* 8.0* 8.0* 8.3*  PHOS 2.7 2.7 3.3 3.7   Liver Function Tests: Recent Labs  Lab 06/27/21 1332 06/28/21 0249 06/29/21 0140 07/01/21 0647  ALBUMIN 1.9* 1.9* 1.8* 1.8*   No results for input(s): LIPASE, AMYLASE in the last 168 hours. No results for input(s): AMMONIA in the last 168 hours. CBC: Recent Labs  Lab 06/27/21 1332 06/29/21 0140 07/01/21 0808  WBC 11.4* 13.2* 12.9*  NEUTROABS 8.9* 10.0*  --   HGB 9.4* 9.1* 9.1*  HCT 31.0* 29.5* 28.8*  MCV 97.5 98.3 95.4  PLT 420* 307 336    CBG: Recent Labs  Lab 07/02/21 1259 07/02/21 1614 07/02/21 2111 07/02/21 2356 07/03/21 0449  GLUCAP 125* 151* 180* 189* 209*     Scheduled Meds:   atorvastatin  40 mg Per Tube Daily   Chlorhexidine Gluconate Cloth  6 each Topical Daily   collagenase   Topical Daily   darbepoetin (ARANESP) injection - DIALYSIS  150 mcg Intravenous Q Thu-HD   feeding supplement (PROSource TF)  45 mL Per Tube Daily   gabapentin  100 mg Per Tube Q12H   insulin aspart  0-6 Units Subcutaneous Q4H   lactulose  20 g Per Tube Daily   metaxalone  400 mg Per Tube TID   midodrine  10 mg Per Tube TID WC   mirtazapine  7.5 mg Per Tube QHS   multivitamin  1 tablet Per Tube QHS   pantoprazole sodium  40 mg Per Tube Daily   sodium chloride flush  10-40 mL Intracatheter Q12H   sodium chloride flush  3 mL Intravenous Q12H   thiamine  100 mg Per Tube Daily   venlafaxine  37.5 mg Per Tube BID   Continuous Infusions:  sodium chloride     sodium chloride     sodium chloride     feeding supplement (OSMOLITE 1.2 CAL) 1,000 mL (07/02/21 1304)   iron sucrose      Principal Problem:   GI bleed Active Problems:   Essential hypertension   Type 2 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, with long-term current use of insulin (HCC)   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   ESRD on dialysis ([place])   Dyslipidemia   Hypokalemia   Stage 2 skin ulcer of sacral region ([place])   Cerebral thrombosis with cerebral infarction   Protein-calorie malnutrition, severe   Acute GI bleeding   Oropharyngeal dysphagia   Failure to thrive in adult   Inadequate oral intake   Clogged feeding tube   Fever   Consultants: Vascular surgery Nephrology Gastroenterology Neurology Palliative medicine General surgery  Procedures: EEG Echocardiogram Cortrack EGD  Antibiotics: Ceftriaxone 8/3 through 8/5   Time spent: 25 minutes    Erin Hearing ANP  [facility] 7 am - 330 pm/M-F for direct patient care and secure chat Please refer to Amion for contact info 44  days

## 2021-07-03 NOTE — Progress Notes (Signed)
Patient went to hemodialysis on a HD chair.

## 2021-07-04 DIAGNOSIS — K922 Gastrointestinal hemorrhage, unspecified: Secondary | ICD-10-CM | POA: Diagnosis not present

## 2021-07-04 DIAGNOSIS — E43 Unspecified severe protein-calorie malnutrition: Secondary | ICD-10-CM | POA: Diagnosis not present

## 2021-07-04 DIAGNOSIS — I6330 Cerebral infarction due to thrombosis of unspecified cerebral artery: Secondary | ICD-10-CM | POA: Diagnosis not present

## 2021-07-04 DIAGNOSIS — R627 Adult failure to thrive: Secondary | ICD-10-CM | POA: Diagnosis not present

## 2021-07-04 LAB — GLUCOSE, CAPILLARY
Glucose-Capillary: 132 mg/dL — ABNORMAL HIGH (ref 70–99)
Glucose-Capillary: 146 mg/dL — ABNORMAL HIGH (ref 70–99)
Glucose-Capillary: 151 mg/dL — ABNORMAL HIGH (ref 70–99)
Glucose-Capillary: 187 mg/dL — ABNORMAL HIGH (ref 70–99)
Glucose-Capillary: 198 mg/dL — ABNORMAL HIGH (ref 70–99)
Glucose-Capillary: 89 mg/dL (ref 70–99)

## 2021-07-04 LAB — RENAL FUNCTION PANEL
Albumin: 1.8 g/dL — ABNORMAL LOW (ref 3.5–5.0)
Anion gap: 12 (ref 5–15)
BUN: 28 mg/dL — ABNORMAL HIGH (ref 8–23)
CO2: 28 mmol/L (ref 22–32)
Calcium: 8.3 mg/dL — ABNORMAL LOW (ref 8.9–10.3)
Chloride: 92 mmol/L — ABNORMAL LOW (ref 98–111)
Creatinine, Ser: 3.23 mg/dL — ABNORMAL HIGH (ref 0.44–1.00)
GFR, Estimated: 15 mL/min — ABNORMAL LOW (ref >60)
Glucose, Bld: 133 mg/dL — ABNORMAL HIGH (ref 70–99)
Phosphorus: 3 mg/dL (ref 2.5–4.6)
Potassium: 4.5 mmol/L (ref 3.5–5.1)
Sodium: 132 mmol/L — ABNORMAL LOW (ref 135–145)

## 2021-07-04 LAB — CBC WITH DIFFERENTIAL/PLATELET
Abs Immature Granulocytes: 0.13 10*3/uL — ABNORMAL HIGH (ref 0.00–0.07)
Basophils Absolute: 0 10*3/uL (ref 0.0–0.1)
Basophils Relative: 0 %
Eosinophils Absolute: 0.4 10*3/uL (ref 0.0–0.5)
Eosinophils Relative: 3 %
HCT: 27.5 % — ABNORMAL LOW (ref 36.0–46.0)
Hemoglobin: 8.7 g/dL — ABNORMAL LOW (ref 12.0–15.0)
Immature Granulocytes: 1 %
Lymphocytes Relative: 11 %
Lymphs Abs: 1.2 10*3/uL (ref 0.7–4.0)
MCH: 29.4 pg (ref 26.0–34.0)
MCHC: 31.6 g/dL (ref 30.0–36.0)
MCV: 92.9 fL (ref 80.0–100.0)
Monocytes Absolute: 1.2 10*3/uL — ABNORMAL HIGH (ref 0.1–1.0)
Monocytes Relative: 11 %
Neutro Abs: 7.9 10*3/uL — ABNORMAL HIGH (ref 1.7–7.7)
Neutrophils Relative %: 74 %
Platelets: 174 10*3/uL (ref 150–400)
RBC: 2.96 MIL/uL — ABNORMAL LOW (ref 3.87–5.11)
RDW: 16.6 % — ABNORMAL HIGH (ref 11.5–15.5)
WBC: 10.9 10*3/uL — ABNORMAL HIGH (ref 4.0–10.5)
nRBC: 0 % (ref 0.0–0.2)

## 2021-07-04 MED ORDER — SODIUM CHLORIDE 0.9 % IV SOLN: 100.0000 mL | INTRAVENOUS | Status: DC | PRN

## 2021-07-04 MED ORDER — ALTEPLASE 2 MG IJ SOLR: 2.0000 mg | Freq: Once | INTRAMUSCULAR | Status: DC | PRN

## 2021-07-04 MED ORDER — APIXABAN 2.5 MG PO TABS
2.5000 mg | ORAL_TABLET | Freq: Two times a day (BID) | ORAL | Status: DC
  Administered 2021-07-04 – 2021-07-05 (×3): 2.5 mg via ORAL
  Filled 2021-07-04 (×3): qty 1

## 2021-07-04 MED ORDER — PENTAFLUOROPROP-TETRAFLUOROETH EX AERO: 1.0000 "application " | INHALATION_SPRAY | CUTANEOUS | Status: DC | PRN

## 2021-07-04 MED ORDER — HEPARIN SODIUM (PORCINE) 1000 UNIT/ML DIALYSIS: 1000.0000 [IU] | INTRAMUSCULAR | Status: DC | PRN

## 2021-07-04 MED ORDER — LIDOCAINE-PRILOCAINE 2.5-2.5 % EX CREA: 1.0000 "application " | TOPICAL_CREAM | CUTANEOUS | Status: DC | PRN

## 2021-07-04 MED ORDER — DARBEPOETIN ALFA 200 MCG/0.4ML IJ SOSY
200.0000 ug | PREFILLED_SYRINGE | INTRAMUSCULAR | Status: DC
  Administered 2021-07-10 – 2021-07-17 (×2): 200 ug via INTRAVENOUS
  Filled 2021-07-04 (×2): qty 0.4

## 2021-07-04 MED ORDER — LIDOCAINE HCL (PF) 1 % IJ SOLN: 5.0000 mL | INTRAMUSCULAR | Status: DC | PRN

## 2021-07-04 NOTE — Progress Notes (Signed)
Renal Navigator aware of pt's case. Will continue to follow.  Melven Sartorius  Renal Navigator  [phone]

## 2021-07-04 NOTE — Plan of Care (Signed)
  Problem: Activity: Goal: Risk for activity intolerance will decrease Outcome: Progressing   Problem: Nutrition: Goal: Adequate nutrition will be maintained Outcome: Progressing   

## 2021-07-04 NOTE — Progress Notes (Signed)
[facility] Progress Note   Subjective:    Seen and examined patient at bedside. No changes. Tolerated yesterday's HD in chair with net UF 2.5L.  Plan for HD 9/10 in chair. Objective Vitals:   07/03/21 1725 07/03/21 2040 07/04/21 0457 07/04/21 1040  BP: (!) 134/55 108/85 127/73 (!) 168/45  Pulse: 98 92 (!) 102 95  Resp: 17 16 16 16   Temp: 97.8 F (36.6 C) 98.4 F (36.9 C) 98.4 F (36.9 C) (!) 97.1 F (36.2 C)  TempSrc: Oral   Axillary  SpO2: 100% 91% 99% 100%  Weight:  102 kg    Height:       Physical Exam General: Chronically ill woman; NAD; NGT in place. Heart: S1 and S2; No murmurs, gallops, or rubs Lungs: Clear anteriorly; No wheezing, rales, or rhonchi Abdomen: Soft and non-tender Extremities: 2+ edema BUE and 2+ BL hips Dialysis Access: L Non-tunneled catheter  Filed Weights   07/01/21 2044 07/02/21 2110 07/03/21 2040  Weight: 102 kg 102 kg 102 kg    Intake/Output Summary (Last 24 hours) at 07/04/2021 1322 Last data filed at 07/04/2021 1112 Gross per 24 hour  Intake 2250 ml  Output 0 ml  Net 2250 ml    Additional Objective Labs: Basic Metabolic Panel: Recent Labs  Lab 06/29/21 0140 07/01/21 0647 07/04/21 0539  NA 131* 130* 132*  K 5.2* 4.6 4.5  CL 95* 92* 92*  CO2 28 25 28   GLUCOSE 82 90 133*  BUN 33* 48* 28*  CREATININE 3.98* 4.88* 3.23*  CALCIUM 8.0* 8.3* 8.3*  PHOS 3.3 3.7 3.0   Liver Function Tests: Recent Labs  Lab 06/29/21 0140 07/01/21 0647 07/04/21 0539  ALBUMIN 1.8* 1.8* 1.8*   No results for input(s): LIPASE, AMYLASE in the last 168 hours. CBC: Recent Labs  Lab 06/27/21 1332 06/29/21 0140 07/01/21 0808 07/04/21 0539  WBC 11.4* 13.2* 12.9* 10.9*  NEUTROABS 8.9* 10.0*  --  7.9*  HGB 9.4* 9.1* 9.1* 8.7*  HCT 31.0* 29.5* 28.8* 27.5*  MCV 97.5 98.3 95.4 92.9  PLT 420* 307 336 174   Blood Culture    Component Value Date/Time   SDES BLOOD RIGHT ANTECUBITAL 06/26/2021 1234   SPECREQUEST  06/26/2021 1234     BOTTLES DRAWN AEROBIC AND ANAEROBIC Blood Culture adequate volume   CULT  06/26/2021 1234    NO GROWTH 5 DAYS Performed at [facility], [place] [address], [place], [place] [address]    REPTSTATUS 07/01/2021 FINAL 06/26/2021 1234    Cardiac Enzymes: No results for input(s): CKTOTAL, CKMB, CKMBINDEX, TROPONINI in the last 168 hours. CBG: Recent Labs  Lab 07/03/21 2039 07/04/21 0005 07/04/21 0456 07/04/21 0815 07/04/21 1201  GLUCAP 94 89 132* 151* 146*   Iron Studies: No results for input(s): IRON, TIBC, TRANSFERRIN, FERRITIN in the last 72 hours. Lab Results  Component Value Date   INR 0.9 03/10/2021   INR 1.0 01/26/2021   Studies/Results: No results found.  Medications:  sodium chloride     feeding supplement (OSMOLITE 1.2 CAL) 1,000 mL (07/04/21 1138)   iron sucrose Stopped (07/03/21 1300)    apixaban  2.5 mg Oral BID   atorvastatin  40 mg Per Tube Daily   Chlorhexidine Gluconate Cloth  6 each Topical Daily   collagenase   Topical Daily   darbepoetin (ARANESP) injection - DIALYSIS  150 mcg Intravenous Q Thu-HD   feeding supplement (PROSource TF)  45 mL Per Tube Daily   gabapentin  100 mg Per  Tube Q12H   insulin aspart  0-6 Units Subcutaneous Q4H   lactulose  20 g Per Tube Daily   metaxalone  400 mg Per Tube TID   midodrine  10 mg Per Tube TID WC   mirtazapine  7.5 mg Per Tube QHS   multivitamin  1 tablet Per Tube QHS   pantoprazole sodium  40 mg Per Tube Daily   sodium chloride flush  10-40 mL Intracatheter Q12H   sodium chloride flush  3 mL Intravenous Q12H   thiamine  100 mg Per Tube Daily   venlafaxine  37.5 mg Per Tube BID    Dialysis Orders: AF MWF  3h 21mn 400/500    62.5kg    3K/2.5 Ca    P2  AVG HeRO   Hep none - Mircera 50 q 2 wks  (7/13)  Venofer 50 q wk - Parsabiv 561mIV q HD  Assessment/Plan: AMS/FTT: Providers and palliative care met w/ family and discussed pt's decline. Due to declining mental and physical status we don't anticipate she  will be able to do OP dialysis.  Pt will have to be able to sit in a chair for 4-6 hrs to qualify for OP HD w/ our group. Patient tolerated being in chair during HD on 9/8. Moving forward, she needs to be in the chair for all HD treatments. If she continues to tolerate sitting in the chair for dialysis and cooperate, then will re-CLIP the patient as her OP HD unit will not take her back. I spoke with HD Nurse Coordinator. I was informed that before re-clipping her, we need to ensure patient nutritional status is stable. Melena/Rectal bleeding - GI consulted. Hx rectal ulcers.  EGD 7/27 normal, flex sig with mucosal ulceration c/w sterocoral ulcers, non bleeding. Hgb 9.1, trending down again. ESRD: usual schedule MWF. Now on TTS while here-Plan for HD 9/10.  Dysphagia-Patient tolerated being in the chair during yesterday's HD. Per primary, if patient continues to tolerate being in the chair for HD on 9/12, plan for G-tube placement via surgery.  Access Issues/AVG clotted: Had f'gram 6/23 which showed chronic thrombus and was started on Eliquis. AVG clotted 9/2, unable to be declotted d/t arm contracture. S/P conversion to tunneled HD catheter 9/7 via IR. Hypertension/volume: BP stable, on midodrine 1053mID with significant overload on exam. Need to ^ UFG with HD as tolerated, can give IV albumin prn. Nutrition: Albumin 1.7. Getting TFs via NG tube. 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  Anemia: Hgb down to 8.7, continue Aranesp-will raise dose.  Dispo: DNR/DNI. Poor prognosis. Palliative care has seen see #1. Appreciate their involvement. Although patient is currently tolerating being in the chair for HD, we do not believe dialysis is adding to her quality of life. Per family communication, family wishes to continue. Have asked primary team that family members sit with patient during HD to see the reality of situation and to hopefully console her as she cries entire treatment, asking for help.     CouTobie PoetP [facility] 07/04/2021,1:22 PM  LOS: 45 days

## 2021-07-04 NOTE — Progress Notes (Signed)
Family member at bedside(nephew) wanted detailed information regarding pt. To relay to family. I explained the person that I have to speak with her son to make sure its ok. He got a little loud and upset when I told him I have to go look and the chart and call Brian(son). He was angry and I told him when I speak to [name] I will get his answer and discuss that information. Called [name] didn't get an answer.

## 2021-07-04 NOTE — Progress Notes (Signed)
[facility] PROGRESS NOTE  Amy [name] [id number] DOB: 1945/11/17 DOA: 05/19/2021 PCP: Amy Maw, MD  Status:  Remains inpatient appropriate because:Unsafe d/c plan, IV treatments appropriate due to intensity of illness or inability to take PO, and Inpatient level of care appropriate due to severity of illness  Dispo: The patient is from: Home              Anticipated d/c is to: SNF              Patient currently is medically stable to d/c.   Difficult to place patient Yes              Barriers to discharge: Dialysis patient with markedly decreased mobility-we will need to prove tolerance of sitting in chair for hemodialysis while here as well as tolerating sitting in wheelchair for up to 1 hour x 2 to replicate transport process.  Cannot discharge and proceed with outpatient hemodialysis unless this is accomplished.    Level of care: Med-Surg  Code Status: DNR Family Communication:  DVT prophylaxis: Eliquis currently on hold secondary to need for placement of [place] vaccination status: [name] 02/26/2020, Pfizer 04/10/2021    HPI: 75 y.o. 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 on 03/13/2021-04/22/2021 during which patient underwent flexible sigmoidoscopy on 03/26/2021 which showed multiple rectal ulcers likely secondary to trauma from enemas and constipation.  Patient presented to the ED on 7/25 with dark stool.  On presentation, hemoglobin was 9.6; GI was consulted.  She underwent EGD which was normal and flexible sigmoidoscopy which showed nonbleeding distal rectal ulcers/stercoral ulcers.  Subsequently, GI signed off.  Neurology was also consulted for altered mental status; MRI brain was similar to prior MRI at [facility] with a large right MCA stroke.   Nephrology has been following for dialysis needs; nephrology is stating that  patient will not be accepted back to outpatient dialysis at this time due to her underlying confusion, weakness and inability to sit in a dialysis recliner as an outpatient.   Due to her poor oral intake, family wanted a PEG tube placement.  IR tried but was unable to place because of overlying transverse colon.   General surgery was consulted for G-tube placement but family currently undecided.  She has an NG tube in place.   Palliative care following for goals of care discussion.  Subjective: Awake.  Pleasant but confused.  After I completed my examination patient stated to me: "I hate to tell you this baby need to cut this short because I am just exhausted"  Objective: Vitals:   07/03/21 2040 07/04/21 0457  BP: 108/85 127/73  Pulse: 92 (!) 102  Resp: 16 16  Temp: 98.4 F (36.9 C) 98.4 F (36.9 C)  SpO2: 91% 99%    Intake/Output Summary (Last 24 hours) at 07/04/2021 0814 Last data filed at 07/04/2021 0600 Gross per 24 hour  Intake 1300 ml  Output 2500 ml  Net -1200 ml   Filed Weights   07/01/21 2044 07/02/21 2110 07/03/21 2040  Weight: 102 kg 102 kg 102 kg    Exam:  Constitutional: NAD, calm, alert Respiratory: Lung sounds clear to auscultation, room air, no increased work of breathing Cardiovascular: S1-S2, pulse regular, stable peripheral edema Abdomen: Soft and nondistended with normoactive bowel sounds.[name] LBM 9/08-cortrack for feeding-extremely poor oral intake Neurologic: CN 2-12 grossly intact -Strength 3/5  on the right. 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  Very pleasant   Assessment/Plan: Acute problems: Acute on chronic lower GI bleeding from stercoral ulcers -Patient presented with anemia,  underwent EGD which was normal and flex sigmoidoscopy with clean base, nonbleeding distal rectal ulcers (stercoral ulcers). Gastroenterology now signed off  -Hemoglobin stable (7-8).      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  Merrily consistent with encephalopathy Lipid panel and hemoglobin A1c within normal limits.  Continue Lipitor Diet as per SLP recommendations.  Extremely poor to no oral intake Cortrak tube in place for enteral feedings Has not eaten any oral food for the past 4 days therefore will resume continuous tube feedings. Discussed with surgical team.  Patient needs to prove tolerance of HD in chair before consideration for open G-tube noting this would be a high risk procedure.  I have asked staff to document when patient up for dialysis since and whether she tolerates or has to be placed back in bed As of 9/9 patient has been tolerating dialysis in chair so if tolerates dialysis on 9/12 plan is to contact surgical team in order to discuss open G-tube placement  ESRD on HD Per Nephro Patient is not a candidate for continued outpatient HD given her confusion, weakness and inability to sit up in a chair for HD outpatient. 9/8 patient tolerating HD in chair well noting she is sleeping. Nephrology suggested transition to hospice care but family resistant.  PMT spoke with son by phone recently and he insisted that as long as his mother could speak that she would remain full aggressive care.. Continue Neurontin and Oxy IR for suspected pain during dialysis TDC placed 9/7 and is functional  Hyperkalemia/hypophosphatemia Management per nephrology HD -supplementation as needed   Hypotension Blood pressure currently is stable on midodrine.   Failure to thrive /Generalized deconditioning  /very poor oral intake PEG tube was desired by family but could not be placed because of overlying transverse colon.   Patient did not eat anything over the 4 days her tube  feeding was held therefore tube feeding resumed at previous rate on 9/5 Discussed with general surgery on 8/31 and if patient can tolerate dialysis in chair and plan is to discuss possible open G-tube placed  Constipation Patient was on chronic narcotics prior to admission and these remain in place Patient was on 40 g Chronulac daily prn.  Continue scheduled Chronulac 20 g daily and presentation with rectal ulcers likely secondary to constipation chronic  Type 2 diabetes mellitus Episodes of hypoglycemia Hemoglobin A1c 5.1.  Diet controlled at baseline  Chronic thrombus left upper extremity 9/9 will resume Eliquis post [facility] placed 9/7 and is functional Elevate as appropriate  Stage II mid coccyx pressure injury, present on admission Continue wound care    GERD Could also be contributing to patient's poor oral intake Increased frequency of Protonix to BID sitter adding Pepcid     Data Reviewed: Basic Metabolic Panel: Recent Labs  Lab 06/27/21 1332 06/28/21 0249 06/29/21 0140 07/01/21 0647 07/04/21 0539  NA 133* 132* 131* 130* 132*  K 6.3* 5.9* 5.2* 4.6 4.5  CL 94* 95* 95* 92* 92*  CO2 '28 26 28 25 28  '$ GLUCOSE 110* 82 82  90 133*  BUN 36* 28* 33* 48* 28*  CREATININE 4.00* 3.40* 3.98* 4.88* 3.23*  CALCIUM 8.1* 8.0* 8.0* 8.3* 8.3*  PHOS 2.7 2.7 3.3 3.7 3.0   Liver Function Tests: Recent Labs  Lab 06/27/21 1332 06/28/21 0249 06/29/21 0140 07/01/21 0647 07/04/21 0539  ALBUMIN 1.9* 1.9* 1.8* 1.8* 1.8*   No results for input(s): LIPASE, AMYLASE in the last 168 hours. No results for input(s): AMMONIA in the last 168 hours. CBC: Recent Labs  Lab 06/27/21 1332 06/29/21 0140 07/01/21 0808 07/04/21 0539  WBC 11.4* 13.2* 12.9* 10.9*  NEUTROABS 8.9* 10.0*  --  7.9*  HGB 9.4* 9.1* 9.1* 8.7*  HCT 31.0* 29.5* 28.8* 27.5*  MCV 97.5 98.3 95.4 92.9  PLT 420* 307 336 174    CBG: Recent Labs  Lab 07/03/21 1517 07/03/21 1555 07/03/21 2039 07/04/21 0005 07/04/21 0456   GLUCAP 68* 83 94 89 132*     Scheduled Meds:  atorvastatin  40 mg Per Tube Daily   Chlorhexidine Gluconate Cloth  6 each Topical Daily   collagenase   Topical Daily   darbepoetin (ARANESP) injection - DIALYSIS  150 mcg Intravenous Q Thu-HD   feeding supplement (PROSource TF)  45 mL Per Tube Daily   gabapentin  100 mg Per Tube Q12H   insulin aspart  0-6 Units Subcutaneous Q4H   lactulose  20 g Per Tube Daily   metaxalone  400 mg Per Tube TID   midodrine  10 mg Per Tube TID WC   mirtazapine  7.5 mg Per Tube QHS   multivitamin  1 tablet Per Tube QHS   pantoprazole sodium  40 mg Per Tube Daily   sodium chloride flush  10-40 mL Intracatheter Q12H   sodium chloride flush  3 mL Intravenous Q12H   thiamine  100 mg Per Tube Daily   venlafaxine  37.5 mg Per Tube BID   Continuous Infusions:  sodium chloride     feeding supplement (OSMOLITE 1.2 CAL) 1,000 mL (07/03/21 0755)   iron sucrose Stopped (07/03/21 1300)    Principal Problem:   GI bleed Active Problems:   Essential hypertension   Type 2 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, with long-term current use of insulin (HCC)   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   ESRD on dialysis ([place])   Dyslipidemia   Hypokalemia   Stage 2 skin ulcer of sacral region ([place] [address])   Cerebral thrombosis with cerebral infarction   Protein-calorie malnutrition, severe   Acute GI bleeding   Oropharyngeal dysphagia   Failure to thrive in adult   Inadequate oral intake   Clogged feeding tube   Fever   Consultants: Vascular surgery Nephrology Gastroenterology Neurology Palliative medicine General surgery  Procedures: EEG Echocardiogram Cortrack EGD  Antibiotics: Ceftriaxone 8/3 through 8/5   Time spent: 25 minutes    Erin Hearing ANP  [facility] 7 am - 330 pm/M-F for direct patient care and secure chat Please refer to Amion for contact info 45  days

## 2021-07-04 NOTE — Progress Notes (Signed)
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  She had altered mental status seen by neurology MRI similar to prior MRI at [facility] with a large right MCA stroke, seen by nephrology for dialysis need.  Patient having ongoing confusion weakness inability to sit in dialysis recliner and so not accepted back to outpatient dialysis at this time and awaiting further improvement.  She has had poor intake failure to thrive family wanted PEG tube I had tried but was unable to place because of overlying transverse colon and has been on core track NG tube feeding.  Seen by surgery for G-tube placement but family currently undecided and continues to have nutrition through core track NG tube Subjective: Seen this morning she is alert awake answers few questions, On cortrak tube feeding  Assessment & Plan:  Acute on chronic lower GI bleeding from stercoral ulcer: Status post EGD and flex sigmoidoscopy by GI.  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    Dysphagia in the setting of confusion and stroke currently on core track tube feeding.  Discussion undergoing for long-term enteral route.  IR unable to place PEG tube, surgery has been consulted.   ESRD on HD MWF continue as per nephrology outpatient HD center unable to accept the patient.  She is able to do dialysis on chair 07/03/21. Cont plan per nephro  Anemia of chronic renal disease monitor hemoglobin Recent Labs  Lab 06/27/21 1332 06/29/21 0140 07/01/21 0808 07/04/21 0539  HGB 9.4* 9.1* 9.1* 8.7*  HCT 31.0* 29.5* 28.8* 27.5*    Hypokalemia/hypophosphatemia per HD. Monitor.   Failure to thrive/generalized deconditioning, poor oral intake: PT OT, palliative care following.   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   GERD on PPI   Stage II mid coccyx pressure injury POA continue wound care  Diet Order             Diet NPO time specified  Diet effective now                   Nutrition Problem: Severe Malnutrition Etiology: chronic illness (ESRD on HD, prior CVA) Signs/Symptoms: percent weight loss, severe muscle depletion, energy intake < or equal to 75% for >  or equal to 1 month Percent weight loss: 16.4 % Interventions: Nepro shake, Magic cup, MVI Patient's Body mass index is 35.23 kg/m.  Pressure Injury 05/20/21 Coccyx Mid Stage 2 -  Partial thickness loss of dermis presenting as a shallow open injury with a red, pink wound bed without slough. 1X0.5X0 (Active)  05/20/21 1315  Location: Coccyx  Location Orientation: Mid  Staging: Stage 2 -  Partial thickness loss of dermis presenting as a shallow open injury with a red, pink wound bed  without slough.  Wound Description (Comments): 1X0.5X0  Present on Admission: Yes    DVT prophylaxis: Place TED hose Start: 05/19/21 1706 Code Status:   Code Status: DNR  Family Communication: plan of care discussed with patient at bedside. Status is: Inpatient  Remains inpatient appropriate because:Inpatient level of care appropriate due to severity of illness  Dispo: The patient is from: Home              Anticipated d/c is to: SNF              Patient currently is medically stable to d/c.   Difficult to place patient Yes       Unresulted Labs (From admission, onward)    None       Medications reviewed:  Scheduled Meds:  atorvastatin  40 mg Per Tube Daily   Chlorhexidine Gluconate Cloth  6 each Topical Daily   collagenase   Topical Daily   darbepoetin (ARANESP) injection - DIALYSIS  150 mcg Intravenous Q Thu-HD   feeding supplement (PROSource TF)  45 mL Per Tube Daily   gabapentin  100 mg Per Tube Q12H   insulin aspart  0-6 Units Subcutaneous Q4H   lactulose  20 g Per Tube Daily   metaxalone  400 mg Per Tube TID   midodrine  10 mg Per Tube TID WC   mirtazapine  7.5 mg Per Tube QHS   multivitamin  1 tablet Per Tube QHS   pantoprazole sodium  40 mg Per Tube Daily   sodium chloride flush  10-40 mL Intracatheter Q12H   sodium chloride flush  3 mL Intravenous Q12H   thiamine  100 mg Per Tube Daily   venlafaxine  37.5 mg Per Tube BID   Continuous Infusions:  sodium chloride     feeding supplement (OSMOLITE 1.2 CAL) 1,000 mL (07/04/21 1138)   iron sucrose Stopped (07/03/21 1300)   Consultants:see note  Procedures:see note Antimicrobials: Anti-infectives (From admission, onward)    Start     Dose/Rate Route Frequency Ordered Stop   07/02/21 1116  ceFAZolin (ANCEF) IVPB 2g/100 mL premix        over 30 Minutes Intravenous Continuous PRN 07/02/21 1139 07/02/21 1116   07/02/21 1111  ceFAZolin (ANCEF) 2-4 GM/100ML-% IVPB       Note to Pharmacy: Domenick Bookbinder   :  cabinet override      07/02/21 1111 07/02/21 2314   07/01/21 1215  ceFAZolin (ANCEF) IVPB 2g/100 mL premix        2 g 200 mL/hr over 30 Minutes Intravenous On call 07/01/21 0902 07/02/21 1215   05/29/21 1622  ceFAZolin (ANCEF) 2-4 GM/100ML-% IVPB       Note to Pharmacy: Lytle Butte   : cabinet override      05/29/21 1622 05/30/21 0429   05/29/21 0000  cefTRIAXone (ROCEPHIN) 1 g in sodium chloride 0.9 % 100 mL IVPB  Status:  Discontinued        1 g 200  mL/hr over 30 Minutes Intravenous Every 24 hours 05/28/21 1801 05/28/21 1801   05/28/21 1830  cefTRIAXone (ROCEPHIN) 1 g in sodium chloride 0.9 % 100 mL IVPB        1 g 200 mL/hr over 30 Minutes Intravenous Every 24 hours 05/28/21 1801 05/30/21 1907   05/28/21 1630  ceFAZolin (ANCEF) IVPB 2g/100 mL premix        2 g 200 mL/hr over 30 Minutes Intravenous To Radiology 05/28/21 1537 05/29/21 1630      Culture/Microbiology    Component Value Date/Time   SDES BLOOD RIGHT ANTECUBITAL 06/26/2021 1234   SPECREQUEST  06/26/2021 1234    BOTTLES DRAWN AEROBIC AND ANAEROBIC Blood Culture adequate volume   CULT  06/26/2021 1234    NO GROWTH 5 DAYS Performed at [facility], [place] [address] [place], [place] [address]    REPTSTATUS 07/01/2021 FINAL 06/26/2021 1234    Other culture-see note  Objective: Vitals: Today's Vitals   07/03/21 1800 07/03/21 2040 07/04/21 0457 07/04/21 1040  BP:  108/85 127/73 (!) 168/45  Pulse:  92 (!) 102 95  Resp:  '16 16 16  '$ Temp:  98.4 F (36.9 C) 98.4 F (36.9 C) (!) 97.1 F (36.2 C)  TempSrc:    Axillary  SpO2:  91% 99% 100%  Weight:  102 kg    Height:      PainSc: Asleep 0-No pain      Intake/Output Summary (Last 24 hours) at 07/04/2021 1237 Last data filed at 07/04/2021 1112 Gross per 24 hour  Intake 2250 ml  Output 2500 ml  Net -250 ml   Filed Weights   07/01/21 2044 07/02/21 2110 07/03/21 2040  Weight: 102 kg 102 kg 102 kg   Weight change: 0.018 kg  Intake/Output from previous  day: 09/08 0701 - 09/09 0700 In: 1300 [NG/GT:1300] Out: 2500  Intake/Output this shift: Total I/O In: 950 [NG/GT:950] Out: -  Filed Weights   07/01/21 2044 07/02/21 2110 07/03/21 2040  Weight: 102 kg 102 kg 102 kg   Examination: General exam: Awake, obese, not in distress.  Bgt+ HEENT:Oral mucosa moist, Ear/Nose WNL grossly,dentition normal. Respiratory system: bilaterally diminished, no use of accessory muscle, non tender. Cardiovascular system: S1 & S2 +,no JVD. Gastrointestinal system: Abdomen soft, NT,ND, BS+. Nervous System:Alert, awake, moving extremities, left-sided neglect, chronic left hemiparesis Extremities: no edema, distal peripheral pulses palpable.  Skin: No rashes,no icterus. MSK: Normal muscle bulk,tone, power Data Reviewed: I have personally reviewed following labs and imaging studies CBC: Recent Labs  Lab 06/27/21 1332 06/29/21 0140 07/01/21 0808 07/04/21 0539  WBC 11.4* 13.2* 12.9* 10.9*  NEUTROABS 8.9* 10.0*  --  7.9*  HGB 9.4* 9.1* 9.1* 8.7*  HCT 31.0* 29.5* 28.8* 27.5*  MCV 97.5 98.3 95.4 92.9  PLT 420* 307 336 [id number]   Basic Metabolic Panel: Recent Labs  Lab 06/27/21 1332 06/28/21 0249 06/29/21 0140 07/01/21 0647 07/04/21 0539  NA 133* 132* 131* 130* 132*  K 6.3* 5.9* 5.2* 4.6 4.5  CL 94* 95* 95* 92* 92*  CO2 '28 26 28 25 28  '$ GLUCOSE 110* 82 82 90 133*  BUN 36* 28* 33* 48* 28*  CREATININE 4.00* 3.40* 3.98* 4.88* 3.23*  CALCIUM 8.1* 8.0* 8.0* 8.3* 8.3*  PHOS 2.7 2.7 3.3 3.7 3.0   GFR: Estimated Creatinine Clearance: 19.1 mL/min (A) (by C-G formula based on SCr of 3.23 mg/dL (H)). Liver Function Tests: Recent Labs  Lab 06/27/21 1332 06/28/21 0249 06/29/21 0140 07/01/21 [id number] 07/04/21 [id number]  ALBUMIN 1.9* 1.9* 1.8* 1.8* 1.8*   No results for input(s): LIPASE, AMYLASE in the last 168 hours. No results for input(s): AMMONIA in the last 168 hours. Coagulation Profile: No results for input(s): INR, PROTIME in the last 168 hours. Cardiac  Enzymes: No results for input(s): CKTOTAL, CKMB, CKMBINDEX, TROPONINI in the last 168 hours. BNP (last 3 results) No results for input(s): PROBNP in the last 8760 hours. HbA1C: No results for input(s): HGBA1C in the last 72 hours. CBG: Recent Labs  Lab 07/03/21 2039 07/04/21 0005 07/04/21 0456 07/04/21 0815 07/04/21 1201  GLUCAP 94 89 132* 151* 146*   Lipid Profile: No results for input(s): CHOL, HDL, LDLCALC, TRIG, CHOLHDL, LDLDIRECT in the last 72 hours. Thyroid Function Tests: No results for input(s): TSH, T4TOTAL, FREET4, T3FREE, THYROIDAB in the last 72 hours. Anemia Panel: No results for input(s): VITAMINB12, FOLATE, FERRITIN, TIBC, IRON, RETICCTPCT in the last 72 hours. Sepsis Labs: No results for input(s): PROCALCITON, LATICACIDVEN in the last 168 hours.  Recent Results (from the past 240 hour(s))  Culture, blood (routine x 2)     Status: None   Collection Time: 06/26/21 12:22 PM   Specimen: BLOOD  Result Value Ref Range Status   Specimen Description BLOOD RIGHT ANTECUBITAL  Final   Special Requests   Final    BOTTLES DRAWN AEROBIC AND ANAEROBIC Blood Culture adequate volume   Culture   Final    NO GROWTH 5 DAYS Performed at [facility], [address] [place], [place] [address]    Report Status 07/01/2021 FINAL  Final  Culture, blood (routine x 2)     Status: None   Collection Time: 06/26/21 12:34 PM   Specimen: BLOOD  Result Value Ref Range Status   Specimen Description BLOOD RIGHT ANTECUBITAL  Final   Special Requests   Final    BOTTLES DRAWN AEROBIC AND ANAEROBIC Blood Culture adequate volume   Culture   Final    NO GROWTH 5 DAYS Performed at [facility], [place] [address], [place], [place] [address]    Report Status 07/01/2021 FINAL  Final     Radiology Studies: No results found.   LOS: 45 days   Antonieta Pert, MD [facility]  07/04/2021, 12:37 PM

## 2021-07-05 DIAGNOSIS — Z992 Dependence on renal dialysis: Secondary | ICD-10-CM | POA: Diagnosis not present

## 2021-07-05 DIAGNOSIS — N186 End stage renal disease: Secondary | ICD-10-CM | POA: Diagnosis not present

## 2021-07-05 LAB — GLUCOSE, CAPILLARY
Glucose-Capillary: 132 mg/dL — ABNORMAL HIGH (ref 70–99)
Glucose-Capillary: 155 mg/dL — ABNORMAL HIGH (ref 70–99)
Glucose-Capillary: 197 mg/dL — ABNORMAL HIGH (ref 70–99)
Glucose-Capillary: 208 mg/dL — ABNORMAL HIGH (ref 70–99)
Glucose-Capillary: 212 mg/dL — ABNORMAL HIGH (ref 70–99)

## 2021-07-05 LAB — HEPATITIS B SURFACE ANTIGEN: Hepatitis B Surface Ag: NONREACTIVE

## 2021-07-05 MED ORDER — HEPARIN SODIUM (PORCINE) 1000 UNIT/ML IJ SOLN
INTRAMUSCULAR | Status: AC
  Filled 2021-07-05: qty 4

## 2021-07-05 MED ORDER — PANCRELIPASE (LIP-PROT-AMYL) 10440-39150 UNITS PO TABS
20880.0000 [IU] | ORAL_TABLET | Freq: Once | ORAL | Status: AC
  Administered 2021-07-05: 20880 [IU]
  Filled 2021-07-05: qty 2

## 2021-07-05 MED ORDER — SODIUM BICARBONATE 650 MG PO TABS
650.0000 mg | ORAL_TABLET | Freq: Once | ORAL | Status: AC
  Administered 2021-07-05: 650 mg
  Filled 2021-07-05: qty 1

## 2021-07-05 NOTE — Progress Notes (Signed)
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  She had altered mental status seen by neurology MRI similar to prior MRI at [facility] with a large right MCA stroke, seen by nephrology for dialysis need.  Patient having ongoing confusion weakness inability to sit in dialysis recliner and so not accepted back to outpatient dialysis at this time and awaiting further improvement.  She has had poor intake failure to thrive family wanted PEG tube I had tried but was unable to place because of overlying transverse colon and has been on core track NG tube feeding.  Seen by surgery for G-tube placement but family currently undecided and continues to have nutrition through core track NG tube Subjective: Seen this morning she is alert awake pleasantly confused.  Not in distress going for dialysis  Assessment & Plan:  Acute on chronic lower GI bleeding from stercoral ulcer: Status post EGD and flex sigmoidoscopy by GI.  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  Failure to  thrive/generalized deconditioning, poor oral intake: Dysphagia in the setting of confusion and stroke currently on core track tube feeding.  Discussion undergoing for long-term enteral route.  IR unable to place PEG tube, surgery has been consulted-if patient continues to tolerate dialysis in chair plan is to notify surgical team on 9/12.   ESRD on HD MWF continue as per nephrology outpatient HD center unable to accept the patient.  She is able to do dialysis on chair  since 07/03/21. Cont plan per nephro-going for dialysis today  Anemia of chronic renal disease monitor hemoglobin intermittently.  Defer to Recent Labs  Lab 06/29/21 0140 07/01/21 0808 07/04/21 0539  HGB 9.1* 9.1* 8.7*  HCT 29.5* 28.8* 27.5*     Hypokalemia/hypophosphatemia per HD.  Defer to nephrology.    Constipation continue laxatives    Hypertension BP stable on midodrine   Type 2 diabetes mellitus with episodes of hypoglycemia A1c 5.1, diet controlled , continue to monitor blood glucose.   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   GERD on PPI   Stage II mid coccyx pressure injury POA continue wound care  Diet Order             Diet NPO time specified  Diet effective now                   Nutrition Problem: Severe Malnutrition  Etiology: chronic illness (ESRD on HD, prior CVA) Signs/Symptoms: percent weight loss, severe muscle depletion, energy intake < or equal to 75% for > or equal to 1 month Percent weight loss: 16.4 % Interventions: Nepro shake, Magic cup, MVI Patient's Body mass index is 35.22 kg/m.  Pressure Injury 05/20/21 Coccyx Mid Stage 2 -  Partial thickness loss of dermis presenting as a shallow open injury with a red, pink wound bed without slough. 1X0.5X0 (Active)  05/20/21 1315  Location: Coccyx  Location  Orientation: Mid  Staging: Stage 2 -  Partial thickness loss of dermis presenting as a shallow open injury with a red, pink wound bed without slough.  Wound Description (Comments): 1X0.5X0  Present on Admission: Yes    DVT prophylaxis: apixaban (ELIQUIS) tablet 2.5 mg Start: 07/04/21 1400 Place TED hose Start: 05/19/21 1706 Code Status:   Code Status: DNR  Family Communication: plan of care discussed with patient at bedside. Status is: Inpatient  Remains inpatient appropriate because:Inpatient level of care appropriate due to severity of illness  Dispo: The patient is from: Home              Anticipated d/c is to: SNF              Patient currently is not medically stable   Difficult to place patient Yes Unresulted Labs (From admission, onward)     Start     Ordered   07/05/21 0500  Hepatitis B surface antigen  Once,   R        07/05/21 0500            Medications reviewed:  Scheduled Meds:  apixaban  2.5 mg Oral BID   atorvastatin  40 mg Per Tube Daily   Chlorhexidine Gluconate Cloth  6 each Topical Daily   collagenase   Topical Daily   [START ON 07/10/2021] darbepoetin (ARANESP) injection - DIALYSIS  200 mcg Intravenous Q Thu-HD   feeding supplement (PROSource TF)  45 mL Per Tube Daily   gabapentin  100 mg Per Tube Q12H   insulin aspart  0-6 Units Subcutaneous Q4H   lactulose  20 g Per Tube Daily   metaxalone  400 mg Per Tube TID   midodrine  10 mg Per Tube TID WC   mirtazapine  7.5 mg Per Tube QHS   multivitamin  1 tablet Per Tube QHS   pantoprazole sodium  40 mg Per Tube Daily   sodium chloride flush  10-40 mL Intracatheter Q12H   sodium chloride flush  3 mL Intravenous Q12H   thiamine  100 mg Per Tube Daily   venlafaxine  37.5 mg Per Tube BID   Continuous Infusions:  sodium chloride     sodium chloride     sodium chloride     feeding supplement (OSMOLITE 1.2 CAL) 1,000 mL (07/05/21 0748)   iron sucrose Stopped (07/03/21 1300)   Consultants:see note   Procedures:see note Antimicrobials: Anti-infectives (From admission, onward)    Start     Dose/Rate Route Frequency Ordered Stop   07/02/21 1116  ceFAZolin (ANCEF) IVPB 2g/100 mL premix        over 30 Minutes Intravenous Continuous PRN 07/02/21 1139 07/02/21 1116   07/02/21 1111  ceFAZolin (ANCEF) 2-4 GM/100ML-% IVPB       Note to Pharmacy: Domenick Bookbinder   : cabinet override      07/02/21 1111 07/02/21 2314   07/01/21 1215  ceFAZolin (ANCEF) IVPB 2g/100 mL premix  2 g 200 mL/hr over 30 Minutes Intravenous On call 07/01/21 0902 07/02/21 1215   05/29/21 1622  ceFAZolin (ANCEF) 2-4 GM/100ML-% IVPB       Note to Pharmacy: Lytle Butte   : cabinet override      05/29/21 1622 05/30/21 0429   05/29/21 0000  cefTRIAXone (ROCEPHIN) 1 g in sodium chloride 0.9 % 100 mL IVPB  Status:  Discontinued        1 g 200 mL/hr over 30 Minutes Intravenous Every 24 hours 05/28/21 1801 05/28/21 1801   05/28/21 1830  cefTRIAXone (ROCEPHIN) 1 g in sodium chloride 0.9 % 100 mL IVPB        1 g 200 mL/hr over 30 Minutes Intravenous Every 24 hours 05/28/21 1801 05/30/21 1907   05/28/21 1630  ceFAZolin (ANCEF) IVPB 2g/100 mL premix        2 g 200 mL/hr over 30 Minutes Intravenous To Radiology 05/28/21 1537 05/29/21 1630      Culture/Microbiology    Component Value Date/Time   SDES BLOOD RIGHT ANTECUBITAL 06/26/2021 1234   SPECREQUEST  06/26/2021 1234    BOTTLES DRAWN AEROBIC AND ANAEROBIC Blood Culture adequate volume   CULT  06/26/2021 1234    NO GROWTH 5 DAYS Performed at [facility], [place] [address], [place], [place] [address]    REPTSTATUS 07/01/2021 FINAL 06/26/2021 1234    Other culture-see note  Objective: Vitals: Today's Vitals   07/05/21 0628 07/05/21 0852 07/05/21 1016 07/05/21 1022  BP: (!) 138/99  (!) 178/65 (!) 159/90  Pulse: 97  96 (!) 104  Resp: '16  15 15  '$ Temp: 98.1 F (36.7 C) 98.3 F (36.8 C) 97.7 F (36.5 C)   TempSrc: Oral  Oral   SpO2: 100%  100%    Weight:   102 kg   Height:      PainSc:        Intake/Output Summary (Last 24 hours) at 07/05/2021 1105 Last data filed at 07/05/2021 0900 Gross per 24 hour  Intake 1990 ml  Output --  Net 1990 ml    Filed Weights   07/02/21 2110 07/03/21 2040 07/05/21 1016  Weight: 102 kg 102 kg 102 kg   Weight change:   Intake/Output from previous day: 09/09 0701 - 09/10 0700 In: 1990 [NG/GT:1990] Out: -  Intake/Output this shift: No intake/output data recorded. Filed Weights   07/02/21 2110 07/03/21 2040 07/05/21 1016  Weight: 102 kg 102 kg 102 kg   Examination:  General exam: AAOx to self, pleasantly confused, obese, older than stated age, weak appearing. HEENT:Oral mucosa moist, Ear/Nose WNL grossly, dentition normal. Respiratory system: bilaterally diminished, no added sounds, no use of accessory muscle Cardiovascular system: S1 & S2 +, No JVD,. Gastrointestinal system: Abdomen soft, obese NT,ND, BS+ Nervous System:Alert, awake, contractures present mostly in the left upper extremity and left lower extremity  Extremities: Chronic edematous upper extremities, distal peripheral pulses palpable.  Skin: No rashes,no icterus. MSK: Normal muscle bulk,tone, power   Data Reviewed: I have personally reviewed following labs and imaging studies CBC: Recent Labs  Lab 06/29/21 0140 07/01/21 0808 07/04/21 0539  WBC 13.2* 12.9* 10.9*  NEUTROABS 10.0*  --  7.9*  HGB 9.1* 9.1* 8.7*  HCT 29.5* 28.8* 27.5*  MCV 98.3 95.4 92.9  PLT 307 336 [id number]    Basic Metabolic Panel: Recent Labs  Lab 06/29/21 0140 07/01/21 0647 07/04/21 0539  NA 131* 130* 132*  K 5.2* 4.6 4.5  CL 95* 92* 92*  CO2 '28 25 28  '$ GLUCOSE 82 90 133*  BUN 33* 48* 28*  CREATININE 3.98* 4.88* 3.23*  CALCIUM 8.0* 8.3* 8.3*  PHOS 3.3 3.7 3.0    GFR: Estimated Creatinine Clearance: 19.1 mL/min (A) (by C-G formula based on SCr of 3.23 mg/dL (H)). Liver Function Tests: Recent Labs  Lab 06/29/21 0140 07/01/21 0647  07/04/21 0539  ALBUMIN 1.8* 1.8* 1.8*    No results for input(s): LIPASE, AMYLASE in the last 168 hours. No results for input(s): AMMONIA in the last 168 hours. Coagulation Profile: No results for input(s): INR, PROTIME in the last 168 hours. Cardiac Enzymes: No results for input(s): CKTOTAL, CKMB, CKMBINDEX, TROPONINI in the last 168 hours. BNP (last 3 results) No results for input(s): PROBNP in the last 8760 hours. HbA1C: No results for input(s): HGBA1C in the last 72 hours. CBG: Recent Labs  Lab 07/04/21 1615 07/04/21 2036 07/05/21 0000 07/05/21 0413 07/05/21 0855  GLUCAP 187* 198* 197* 212* 132*    Lipid Profile: No results for input(s): CHOL, HDL, LDLCALC, TRIG, CHOLHDL, LDLDIRECT in the last 72 hours. Thyroid Function Tests: No results for input(s): TSH, T4TOTAL, FREET4, T3FREE, THYROIDAB in the last 72 hours. Anemia Panel: No results for input(s): VITAMINB12, FOLATE, FERRITIN, TIBC, IRON, RETICCTPCT in the last 72 hours. Sepsis Labs: No results for input(s): PROCALCITON, LATICACIDVEN in the last 168 hours.  Recent Results (from the past 240 hour(s))  Culture, blood (routine x 2)     Status: None   Collection Time: 06/26/21 12:22 PM   Specimen: BLOOD  Result Value Ref Range Status   Specimen Description BLOOD RIGHT ANTECUBITAL  Final   Special Requests   Final    BOTTLES DRAWN AEROBIC AND ANAEROBIC Blood Culture adequate volume   Culture   Final    NO GROWTH 5 DAYS Performed at [facility], [address] [place], [place] [address]    Report Status 07/01/2021 FINAL  Final  Culture, blood (routine x 2)     Status: None   Collection Time: 06/26/21 12:34 PM   Specimen: BLOOD  Result Value Ref Range Status   Specimen Description BLOOD RIGHT ANTECUBITAL  Final   Special Requests   Final    BOTTLES DRAWN AEROBIC AND ANAEROBIC Blood Culture adequate volume   Culture   Final    NO GROWTH 5 DAYS Performed at [facility], [place] [address]  [place], [place] [address]    Report Status 07/01/2021 FINAL  Final      Radiology Studies: No results found.   LOS: 64 days   Antonieta Pert, MD [facility]  07/05/2021, 11:05 AM

## 2021-07-05 NOTE — Progress Notes (Signed)
[facility] Progress Note   Subjective:   Patient seen and examined at bedside.  Alert but confused. Talking to someone who is not in the room.  No specific complaints.   Objective Vitals:   07/05/21 1100 07/05/21 1130 07/05/21 1200 07/05/21 1230  BP: (!) 153/62 (!) 131/56 (!) 99/53 (!) 132/97  Pulse: (!) 106 (!) 107 (!) 112 (!) 47  Resp: _0 Temp:      TempSrc:      SpO2:      Weight:      Height:       Physical Exam General:chronically ill appearing, confused, elderly female in NAD HEENT: NG tube in place Heart:RRR, no mrg Lungs:CTAB, nml WOB on RA Abdomen:soft, NTND Extremities:2+ edema UE and hips/thighs, 1+ in calves Dialysis Access: [facility]   Filed Weights   07/02/21 2110 07/03/21 2040 07/05/21 1016  Weight: 102 kg 102 kg 102 kg    Intake/Output Summary (Last 24 hours) at 07/05/2021 1302 Last data filed at 07/05/2021 0900 Gross per 24 hour  Intake 1040 ml  Output --  Net 1040 ml    Additional Objective Labs: Basic Metabolic Panel: Recent Labs  Lab 06/29/21 0140 07/01/21 0647 07/04/21 0539  NA 131* 130* 132*  K 5.2* 4.6 4.5  CL 95* 92* 92*  CO2 _1 GLUCOSE 82 90 133*  BUN 33* 48* 28*  CREATININE 3.98* 4.88* 3.23*  CALCIUM 8.0* 8.3* 8.3*  PHOS 3.3 3.7 3.0   Liver Function Tests: Recent Labs  Lab 06/29/21 0140 07/01/21 0647 07/04/21 0539  ALBUMIN 1.8* 1.8* 1.8*    CBC: Recent Labs  Lab 06/29/21 0140 07/01/21 0808 07/04/21 0539  WBC 13.2* 12.9* 10.9*  NEUTROABS 10.0*  --  7.9*  HGB 9.1* 9.1* 8.7*  HCT 29.5* 28.8* 27.5*  MCV 98.3 95.4 92.9  PLT 307 336 174   CBG: Recent Labs  Lab 07/04/21 1615 07/04/21 2036 07/05/21 0000 07/05/21 0413 07/05/21 0855  GLUCAP 187* 198* 197* 212* 132*   Studies/Results: No results found.  Medications:  sodium chloride     sodium chloride     sodium chloride     feeding supplement (OSMOLITE 1.2 CAL) 1,000 mL (07/05/21 0748)   iron sucrose Stopped (07/03/21 1300)     apixaban  2.5 mg Oral BID   atorvastatin  40 mg Per Tube Daily   Chlorhexidine Gluconate Cloth  6 each Topical Daily   collagenase   Topical Daily   [START ON 07/10/2021] darbepoetin (ARANESP) injection - DIALYSIS  200 mcg Intravenous Q Thu-HD   feeding supplement (PROSource TF)  45 mL Per Tube Daily   gabapentin  100 mg Per Tube Q12H   insulin aspart  0-6 Units Subcutaneous Q4H   lactulose  20 g Per Tube Daily   metaxalone  400 mg Per Tube TID   midodrine  10 mg Per Tube TID WC   mirtazapine  7.5 mg Per Tube QHS   multivitamin  1 tablet Per Tube QHS   pantoprazole sodium  40 mg Per Tube Daily   sodium chloride flush  10-40 mL Intracatheter Q12H   sodium chloride flush  3 mL Intravenous Q12H   thiamine  100 mg Per Tube Daily   venlafaxine  37.5 mg Per Tube BID    Dialysis Orders: AF MWF  3h 21mn 400/500    62.5kg    3K/2.5 Ca    P2  AVG HeRO   Hep none - Mircera 50  q 2 wks  (7/13)  Venofer 50 q wk - Parsabiv 35m IV q HD   Assessment/Plan: AMS/FTT: Providers and palliative care met w/ family and discussed pt's decline. Due to declining mental and physical status we don't anticipate she will be able to do OP dialysis.  Pt will have to be able to sit in a chair for 4-6 hrs to qualify for OP HD w/ our group. Patient tolerated being in chair during HD on 9/8. Moving forward, she needs to be in the chair for all HD treatments. If she continues to tolerate sitting in the chair for dialysis and cooperate, then will re-CLIP the patient as her OP HD unit will not take her back. HD Nurse Coordinator aware and following, stated prior to re-clipping her, we need to ensure patient nutritional status is stable. Melena/Rectal bleeding - GI consulted. Hx rectal ulcers.  EGD 7/27 normal, flex sig with mucosal ulceration c/w sterocoral ulcers, non bleeding. Hgb 9.1, trending down again. ESRD: usual schedule MWF. Now on TTS while here-Plan for HD todau.  Dysphagia - Per primary, if patient continues  to tolerate being in the chair for HD on 9/10 and 9/12, plan for G-tube placement via surgery. Access Issues/AVG clotted: Had f'gram 6/23 which showed chronic thrombus and was started on Eliquis. AVG clotted 9/2, unable to be declotted d/t arm contracture. S/P conversion to tunneled HD catheter 9/7 via IR, appreciate their assistance.  Hypertension/volume: BP stable, on midodrine 122mTID with significant overload on exam. Need to ^ UFG with HD as tolerated, can give IV albumin prn. Nutrition: Albumin 1.7. Getting TFs via NG tube. 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  Anemia: Hgb down to 8.7, continue Aranesp-will raise dose.  Dispo: DNR/DNI. Poor prognosis. Palliative care has seen see #1. Appreciate their involvement. Although patient is currently tolerating being in the chair for HD, we do not believe dialysis is adding to her quality of life. Per family communication, family wishes to continue. Have asked primary team that family members sit with patient during HD to see the reality of situation and to hopefully console her as she cries entire treatment, asking for help.   LiJen MowPA-C [facility] 07/05/2021,1:02 PM  LOS: 46 days

## 2021-07-05 NOTE — Progress Notes (Signed)
Patient went to HD in chair.

## 2021-07-05 NOTE — Consult Note (Signed)
[facility] Nurse Consult Note: Reason for Consult:Reconsulted for increased depth in coccygeal ulcer; previously Stage 2. Now a Stage 3. Poorly nourished patient with multiple comorbid conditions, including ESRD on HD, HTN, CVA. Palliative Care has been in consultation with the family regarding Hospice and at this time, no changes have been made to the POC.  Wound type:pressure Pressure Injury POA: Yes Measurement:Per nursing flow sheet today, 2cm x 1.5cm x 1.8cm  Wound bed:red, yellow Drainage (amount, consistency, odor) serous to light yellow consistent with autolytically debriding nonviable tissue Periwound: intact, mild erythema Dressing procedure/placement/frequency: I will add additional pressure injury prevention and treatment interventions to the POC today specifically a pressure redistribution mattress and bilateral pressure redistribution heel boots. Topical care is amended to extend the collagenase treatment ot the coccyx. Turning and repositioning is in place.  [facility] nursing team will not follow, but will remain available to this patient, the nursing and medical teams.  Please re-consult if needed. Thanks, Maudie Flakes, MSN, RN, [place], Arther Abbott  Pager# [phone]

## 2021-07-05 NOTE — Progress Notes (Signed)
This RN tried to unclog pt. Cortrak tube per protocol order, unsuccessfully.

## 2021-07-05 NOTE — Progress Notes (Signed)
Patient's cortrak tube clogged tried to unclog with the help of cortrak wire, no help, notified the MD and initiated unclogged tube feed protocol orders as ordered.  Oncoming nurse aware of the situation.  Will continue to monitor.

## 2021-07-06 ENCOUNTER — Telehealth: Payer: Self-pay

## 2021-07-06 DIAGNOSIS — M549 Dorsalgia, unspecified: Secondary | ICD-10-CM

## 2021-07-06 DIAGNOSIS — R1310 Dysphagia, unspecified: Secondary | ICD-10-CM | POA: Diagnosis not present

## 2021-07-06 DIAGNOSIS — R627 Adult failure to thrive: Secondary | ICD-10-CM | POA: Diagnosis not present

## 2021-07-06 DIAGNOSIS — T85598A Other mechanical complication of other gastrointestinal prosthetic devices, implants and grafts, initial encounter: Secondary | ICD-10-CM | POA: Diagnosis not present

## 2021-07-06 DIAGNOSIS — N186 End stage renal disease: Secondary | ICD-10-CM | POA: Diagnosis not present

## 2021-07-06 LAB — GLUCOSE, CAPILLARY
Glucose-Capillary: 106 mg/dL — ABNORMAL HIGH (ref 70–99)
Glucose-Capillary: 112 mg/dL — ABNORMAL HIGH (ref 70–99)
Glucose-Capillary: 136 mg/dL — ABNORMAL HIGH (ref 70–99)
Glucose-Capillary: 62 mg/dL — ABNORMAL LOW (ref 70–99)
Glucose-Capillary: 65 mg/dL — ABNORMAL LOW (ref 70–99)
Glucose-Capillary: 72 mg/dL (ref 70–99)
Glucose-Capillary: 77 mg/dL (ref 70–99)
Glucose-Capillary: 83 mg/dL (ref 70–99)

## 2021-07-06 MED ORDER — DEXTROSE 50 % IV SOLN
12.5000 g | INTRAVENOUS | Status: AC
  Administered 2021-07-06: 12.5 g via INTRAVENOUS
  Filled 2021-07-06: qty 50

## 2021-07-06 MED ORDER — DEXTROSE 5 % IV SOLN: INTRAVENOUS | Status: DC

## 2021-07-06 MED ORDER — CHLORHEXIDINE GLUCONATE 0.12 % MT SOLN
15.0000 mL | Freq: Two times a day (BID) | OROMUCOSAL | Status: DC
  Administered 2021-07-06 – 2021-08-07 (×58): 15 mL via OROMUCOSAL
  Filled 2021-07-06 (×57): qty 15

## 2021-07-06 MED ORDER — HEPARIN BOLUS VIA INFUSION
4000.0000 [IU] | Freq: Once | INTRAVENOUS | Status: AC
  Administered 2021-07-06: 4000 [IU] via INTRAVENOUS
  Filled 2021-07-06: qty 4000

## 2021-07-06 MED ORDER — OXYCODONE HCL 20 MG/ML PO CONC
5.0000 mg | Freq: Four times a day (QID) | ORAL | Status: DC | PRN
  Administered 2021-07-06 – 2021-08-05 (×18): 5 mg via ORAL
  Filled 2021-07-06 (×19): qty 1

## 2021-07-06 MED ORDER — ORAL CARE MOUTH RINSE
15.0000 mL | Freq: Two times a day (BID) | OROMUCOSAL | Status: DC
  Administered 2021-07-06 – 2021-08-05 (×37): 15 mL via OROMUCOSAL

## 2021-07-06 MED ORDER — DEXTROSE 50 % IV SOLN
INTRAVENOUS | Status: AC
  Administered 2021-07-06: 25 mL
  Filled 2021-07-06: qty 50

## 2021-07-06 MED ORDER — HEPARIN (PORCINE) 25000 UT/250ML-% IV SOLN
1050.0000 [IU]/h | INTRAVENOUS | Status: DC
  Administered 2021-07-06: 1050 [IU]/h via INTRAVENOUS
  Filled 2021-07-06: qty 250

## 2021-07-06 NOTE — Progress Notes (Signed)
Patient ID: [name], female   DOB: 01-29-1946, 75 y.o.   MRN: [id number]    Progress Note from the Palliative Medicine Team at [facility]   Patient Name: Amy [name]        Date: 07/06/2021 DOB: [birth year]  Age: 75 y.o. MRN#: [id number] Attending Physician: Antonieta Pert, MD Primary Care Physician: [name], MD Admit Date: 05/19/2021   Medical records reviewed   75 y.o. 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   03/26/21 Flex sig, at [facility]: multiple rectal ulcers.  Path: Polypoid fragment of colorectal mucosa with crypt architectural disorder including slight hyperplastic features, mild fibromuscular hyperplasia of the lamina propria, and adherent necroinflammatory debris suggestive of ulcer exudate. 05/21/21 Flex sig: clean based, non-bleeding distal rectl ulcers (stercoral ulcer).  Blood clots and solid stool limited visualization.   05/21/21 EGD: Normal.     Patient remains confused, unable to follow commands with poor p.o. intake.  Cor-trac in place but not functioning/clogged.  Today is day 48 of this hospitalization.  This NP visited patient at the bedside as a follow up for palliative medicine needs  Cor-trac is currently non functioning/clogged.    Nursing unable to give medication for pain and Ms Yau is moaning and communicating back pain.  Changed Oxycodone from tablet to solution for sub-lingual administration.  Cor-trac may be replaced tomorrow, need for ongoing conversation with family regarding PEG placement.  Discussed with bedside RN/Samantha  Total time spent on the unit was 25 minutes  Greater than 50% of the time was spent in counseling and coordination of care  Wadie Lessen NP  Palliative Medicine Team Team Phone # [phone] Pager [phone]

## 2021-07-06 NOTE — Progress Notes (Signed)
$'15mg'j$  Roxicodone wasted with Ulice Dash, RN per order of pharmacist Diane since medication is not loaded in pyxis.

## 2021-07-06 NOTE — Progress Notes (Signed)
[facility] Progress Note   Subjective:   Patient seen and examined at bedside.  Less alert and more confused today.  Not answering questions directly.    Objective Vitals:   07/05/21 2004 07/06/21 0005 07/06/21 0436 07/06/21 0947  BP: (!) 143/107 (!) 110/49 (!) 116/47 134/61  Pulse: (!) 109  (!) 105 99  Resp: 19  20   Temp: 98.3 F (36.8 C)  97.6 F (36.4 C) 97.8 F (36.6 C)  TempSrc: Oral  Oral Oral  SpO2: 98%  94% 95%  Weight:      Height:       Physical Exam General:Chronically ill appearing, confused, elderly female in NAD Heart:RRR, no mrg Lungs:CTAB anterolaterally, nml WOB on RA  Abdomen:soft, NTND Extremities: 2+ edema in upper extremities/hips/thighs, 1+ in calves/feet Dialysis Access: [facility]   Filed Weights   07/02/21 2110 07/03/21 2040 07/05/21 1016  Weight: 102 kg 102 kg 102 kg    Intake/Output Summary (Last 24 hours) at 07/06/2021 1041 Last data filed at 07/06/2021 0800 Gross per 24 hour  Intake 0 ml  Output 4022 ml  Net -4022 ml    Additional Objective Labs: Basic Metabolic Panel: Recent Labs  Lab 07/01/21 0647 07/04/21 0539  NA 130* 132*  K 4.6 4.5  CL 92* 92*  CO2 25 28  GLUCOSE 90 133*  BUN 48* 28*  CREATININE 4.88* 3.23*  CALCIUM 8.3* 8.3*  PHOS 3.7 3.0   Liver Function Tests: Recent Labs  Lab 07/01/21 0647 07/04/21 0539  ALBUMIN 1.8* 1.8*   CBC: Recent Labs  Lab 07/01/21 0808 07/04/21 0539  WBC 12.9* 10.9*  NEUTROABS  --  7.9*  HGB 9.1* 8.7*  HCT 28.8* 27.5*  MCV 95.4 92.9  PLT 336 174    CBG: Recent Labs  Lab 07/05/21 2038 07/06/21 0001 07/06/21 0432 07/06/21 0821 07/06/21 0844  GLUCAP 155* 106* 77 62* 136*    Medications:  sodium chloride     dextrose 30 mL/hr at 07/06/21 0941   feeding supplement (OSMOLITE 1.2 CAL) Stopped (07/05/21 1846)   iron sucrose 100 mg (07/05/21 1230)    apixaban  2.5 mg Oral BID   atorvastatin  40 mg Per Tube Daily   chlorhexidine  15 mL Mouth Rinse BID    Chlorhexidine Gluconate Cloth  6 each Topical Daily   collagenase   Topical Daily   [START ON 07/10/2021] darbepoetin (ARANESP) injection - DIALYSIS  200 mcg Intravenous Q Thu-HD   feeding supplement (PROSource TF)  45 mL Per Tube Daily   gabapentin  100 mg Per Tube Q12H   insulin aspart  0-6 Units Subcutaneous Q4H   lactulose  20 g Per Tube Daily   mouth rinse  15 mL Mouth Rinse q12n4p   metaxalone  400 mg Per Tube TID   midodrine  10 mg Per Tube TID WC   mirtazapine  7.5 mg Per Tube QHS   multivitamin  1 tablet Per Tube QHS   pantoprazole sodium  40 mg Per Tube Daily   sodium chloride flush  10-40 mL Intracatheter Q12H   sodium chloride flush  3 mL Intravenous Q12H   thiamine  100 mg Per Tube Daily   venlafaxine  37.5 mg Per Tube BID    Dialysis Orders: AF MWF  3h 70mn 400/500    62.5kg    3K/2.5 Ca    P2  AVG HeRO   Hep none - Mircera 50 q 2 wks  (7/13)  Venofer 50 q wk -  Parsabiv 22m IV q HD   Assessment/Plan: AMS/FTT: Providers and palliative care met w/ family and discussed pt's decline. Due to declining mental and physical status we don't anticipate she will be able to do OP dialysis.  Pt will have to be able to sit in a chair for 4-6 hrs to qualify for OP HD w/ our group. Patient tolerated being in chair during HD on 9/8. Moving forward, she needs to be in the chair for all HD treatments. If she continues to tolerate sitting in the chair for dialysis and cooperate, then will re-CLIP the patient as her OP HD unit will not take her back. HD Nurse Coordinator aware and following, stated prior to re-clipping her, we need to ensure patient nutritional status is stable. Melena/Rectal bleeding - GI consulted. Hx rectal ulcers.  EGD 7/27 normal, flex sig with mucosal ulceration c/w sterocoral ulcers, non bleeding. Hgb 9.1, trending down again. ESRD: usual schedule MWF. Now on TTS while here.  HD nurse reported patient crying during treatment and repeating "ouch" when touched during HD.   Next HD on 07/08/21.  Dysphagia - Per primary, if patient continues to tolerate being in the chair for HD on 9/10 and 9/13, plan for G-tube placement via surgery. Access Issues/AVG clotted: Had f'gram 6/23 which showed chronic thrombus and was started on Eliquis. AVG clotted 9/2, unable to be declotted d/t arm contracture. S/P conversion to tunneled HD catheter 9/7 via IR, appreciate their assistance.  Hypertension/volume: BP stable, on midodrine 134mTID with significant overload on exam. Net UF Need to ^ UFG with HD as tolerated, can give IV albumin prn.  Net UF 4L removed.  Nutrition: Albumin 1.7. Getting TFs via NG tube. 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  Anemia: Hgb down to 8.7, continue Aranesp-will raise dose.  Dispo: DNR/DNI. Poor prognosis. Palliative care has seen see #1. Appreciate their involvement. Although patient is currently tolerating being in the chair for HD, we do not believe dialysis is adding to her quality of life. Per family communication, family wishes to continue. Have asked primary team that family members sit with patient during HD to see the reality of situation and to hopefully console her as she cries entire treatment, asking for help.     LiJen MowPA-C [facility] 07/06/2021,10:41 AM  LOS: 47 days

## 2021-07-06 NOTE — Progress Notes (Signed)
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  She had altered mental status seen by neurology MRI similar to prior MRI at [facility] with a large right MCA stroke, seen by nephrology for dialysis need.  Patient having ongoing confusion weakness inability to sit in dialysis recliner and so not accepted back to outpatient dialysis at this time and awaiting further improvement.  She has had poor intake failure to thrive family wanted PEG tube I had tried but was unable to place because of overlying transverse colon and has been on core track NG tube feeding.  Seen by surgery for G-tube placement but family currently undecided and continues to have nutrition through core track NG tube Subjective:  Sleeping this morning, blood sugar has been low NGT feeding held due to clogging of core track unable to declog. Started on D5 water  Assessment & Plan:  Acute on chronic lower GI bleeding from stercoral ulcer: Status post EGD and flex sigmoidoscopy by GI.  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  Acute encephalopathy in the setting of recent stroke with no meaningful recovery Failure to thrive/generalized deconditioning, poor oral intake Hypoglycemia Dysphagia: in the setting of confusion and stroke currently on core track tube feeding.  Discussion undergoing for long-term enteral route.  IR unable to place PEG tube, surgery has been consulted-if patient continues to tolerate dialysis in chair plan is to notify surgical team on 9/12.  Core track tube has been clogged now placing on IV fluids D5W   ESRD on HD MWF continue as per nephrology outpatient HD center unable to accept the patient.  She is able to do dialysis on chair  since 07/03/21.  Last dialysis 9/10.  Nephrology on board and are okay for IV dextrose for hypoglycemia.   Anemia of chronic renal disease monitor hemoglobin intermittently.  Recent Labs  Lab 07/01/21 0808 07/04/21 0539  HGB 9.1* 8.7*  HCT 28.8* 27.5*     Hypokalemia/hypophosphatemia per HD.  Defer to nephrology.   Constipation continue laxatives    Hypertension BP stable on midodrine   Type 2 diabetes mellitus with episodes of hypoglycemia A1c 5.1, diet controlled , continue to monitor blood glucose.   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   GERD on PPI   Stage II mid coccyx pressure injury POA continue wound care  Goals of care currently DNR family son wanted to continue  with dialysis.  It appears patient has been although sitting on chair during dialysis has been crying through the entire treatment during HD, per nephrology dialysis not adding to quality-of-life.  Palliative care has been consulted-and I have requested to follow along.  Diet Order             Diet NPO time specified  Diet effective now                   Nutrition Problem: Severe  Malnutrition Etiology: chronic illness (ESRD on HD, prior CVA) Signs/Symptoms: percent weight loss, severe muscle depletion, energy intake < or equal to 75% for > or equal to 1 month Percent weight loss: 16.4 % Interventions: Nepro shake, Magic cup, MVI Patient's Body mass index is 35.22 kg/m.  Pressure Injury 05/20/21 Coccyx Mid Stage 2 -  Partial thickness loss of dermis presenting as a shallow open injury with a red, pink wound bed without slough. 1X0.5X0 (Active)  05/20/21 1315  Location: Coccyx  Location Orientation: Mid  Staging: Stage 2 -  Partial thickness loss of dermis presenting as a shallow open injury with a red, pink wound bed without slough.  Wound Description (Comments): 1X0.5X0  Present on Admission: Yes    DVT prophylaxis: apixaban (ELIQUIS) tablet 2.5 mg Start: 07/04/21 1400 Place TED hose Start: 05/19/21 1706 Code Status:   Code Status: DNR  Family Communication: plan of care discussed with patient at bedside. Status is: Inpatient  Remains inpatient appropriate because:Inpatient level of care appropriate due to severity of illness  Dispo: The patient is from: Home              Anticipated d/c is to: SNF              Patient currently is not medically stable   Difficult to place patient Yes Unresulted Labs (From admission, onward)    None       Medications reviewed:  Scheduled Meds:  apixaban  2.5 mg Oral BID   atorvastatin  40 mg Per Tube Daily   chlorhexidine  15 mL Mouth Rinse BID   Chlorhexidine Gluconate Cloth  6 each Topical Daily   collagenase   Topical Daily   [START ON 07/10/2021] darbepoetin (ARANESP) injection - DIALYSIS  200 mcg Intravenous Q Thu-HD   feeding supplement (PROSource TF)  45 mL Per Tube Daily   gabapentin  100 mg Per Tube Q12H   insulin aspart  0-6 Units Subcutaneous Q4H   lactulose  20 g Per Tube Daily   mouth rinse  15 mL Mouth Rinse q12n4p   metaxalone  400 mg Per Tube TID   midodrine  10 mg Per Tube TID WC   mirtazapine   7.5 mg Per Tube QHS   multivitamin  1 tablet Per Tube QHS   pantoprazole sodium  40 mg Per Tube Daily   sodium chloride flush  10-40 mL Intracatheter Q12H   sodium chloride flush  3 mL Intravenous Q12H   thiamine  100 mg Per Tube Daily   venlafaxine  37.5 mg Per Tube BID   Continuous Infusions:  sodium chloride     dextrose 30 mL/hr at 07/06/21 0941   feeding supplement (OSMOLITE 1.2 CAL) Stopped (07/05/21 1846)   iron sucrose 100 mg (07/05/21 1230)   Consultants:see note  Procedures:see note Antimicrobials: Anti-infectives (From admission, onward)    Start     Dose/Rate Route Frequency Ordered Stop   07/02/21 1116  ceFAZolin (ANCEF) IVPB 2g/100 mL premix  over 30 Minutes Intravenous Continuous PRN 07/02/21 1139 07/02/21 1116   07/02/21 1111  ceFAZolin (ANCEF) 2-4 GM/100ML-% IVPB       Note to Pharmacy: Domenick Bookbinder   : cabinet override      07/02/21 1111 07/02/21 2314   07/01/21 1215  ceFAZolin (ANCEF) IVPB 2g/100 mL premix        2 g 200 mL/hr over 30 Minutes Intravenous On call 07/01/21 0902 07/02/21 1215   05/29/21 1622  ceFAZolin (ANCEF) 2-4 GM/100ML-% IVPB       Note to Pharmacy: Lytle Butte   : cabinet override      05/29/21 1622 05/30/21 0429   05/29/21 0000  cefTRIAXone (ROCEPHIN) 1 g in sodium chloride 0.9 % 100 mL IVPB  Status:  Discontinued        1 g 200 mL/hr over 30 Minutes Intravenous Every 24 hours 05/28/21 1801 05/28/21 1801   05/28/21 1830  cefTRIAXone (ROCEPHIN) 1 g in sodium chloride 0.9 % 100 mL IVPB        1 g 200 mL/hr over 30 Minutes Intravenous Every 24 hours 05/28/21 1801 05/30/21 1907   05/28/21 1630  ceFAZolin (ANCEF) IVPB 2g/100 mL premix        2 g 200 mL/hr over 30 Minutes Intravenous To Radiology 05/28/21 1537 05/29/21 1630      Culture/Microbiology    Component Value Date/Time   SDES BLOOD RIGHT ANTECUBITAL 06/26/2021 1234   SPECREQUEST  06/26/2021 1234    BOTTLES DRAWN AEROBIC AND ANAEROBIC Blood Culture adequate volume    CULT  06/26/2021 1234    NO GROWTH 5 DAYS Performed at [facility], [place] [address], [place], [place] [address]    REPTSTATUS 07/01/2021 FINAL 06/26/2021 1234    Other culture-see note  Objective: Vitals: Today's Vitals   07/05/21 2004 07/06/21 0005 07/06/21 0436 07/06/21 0947  BP: (!) 143/107 (!) 110/49 (!) 116/47 134/61  Pulse: (!) 109  (!) 105 99  Resp: 19  20   Temp: 98.3 F (36.8 C)  97.6 F (36.4 C) 97.8 F (36.6 C)  TempSrc: Oral  Oral Oral  SpO2: 98%  94% 95%  Weight:      Height:      PainSc:        Intake/Output Summary (Last 24 hours) at 07/06/2021 1050 Last data filed at 07/06/2021 0800 Gross per 24 hour  Intake 0 ml  Output 4022 ml  Net -4022 ml    Filed Weights   07/02/21 2110 07/03/21 2040 07/05/21 1016  Weight: 102 kg 102 kg 102 kg   Weight change:   Intake/Output from previous day: 09/10 0701 - 09/11 0700 In: 0  Out: 4022  Intake/Output this shift: No intake/output data recorded. Filed Weights   07/02/21 2110 07/03/21 2040 07/05/21 1016  Weight: 102 kg 102 kg 102 kg   Examination:  General exam: Sleeping this morning briefly wakes up, core track tube in place but clogged.  HEENT:Oral mucosa moist, Ear/Nose WNL grossly, dentition normal. Respiratory system: Bilaterally clear, no use of accessory muscle Cardiovascular system: S1 & S2 +, No JVD,. Gastrointestinal system: Abdomen soft, NT,ND, BS+ Nervous System: Sleepy, weakness contractures on left side  Extremities: Edematous upper extremities chronic appearing, Skin: No rashes,no icterus. MSK: Normal muscle bulk,tone, power   Data Reviewed: I have personally reviewed following labs and imaging studies CBC: Recent Labs  Lab 07/01/21 0808 07/04/21 0539  WBC 12.9* 10.9*  NEUTROABS  --  7.9*  HGB 9.1* 8.7*  HCT 28.8* 27.5*  MCV 95.4 92.9  PLT 336 [id number]    Basic Metabolic Panel: Recent Labs  Lab 07/01/21 0647 07/04/21 0539  NA 130* 132*  K 4.6 4.5  CL 92* 92*  CO2 25  28  GLUCOSE 90 133*  BUN 48* 28*  CREATININE 4.88* 3.23*  CALCIUM 8.3* 8.3*  PHOS 3.7 3.0    GFR: Estimated Creatinine Clearance: 19.1 mL/min (A) (by C-G formula based on SCr of 3.23 mg/dL (H)). Liver Function Tests: Recent Labs  Lab 07/01/21 0647 07/04/21 0539  ALBUMIN 1.8* 1.8*    No results for input(s): LIPASE, AMYLASE in the last 168 hours. No results for input(s): AMMONIA in the last 168 hours. Coagulation Profile: No results for input(s): INR, PROTIME in the last 168 hours. Cardiac Enzymes: No results for input(s): CKTOTAL, CKMB, CKMBINDEX, TROPONINI in the last 168 hours. BNP (last 3 results) No results for input(s): PROBNP in the last 8760 hours. HbA1C: No results for input(s): HGBA1C in the last 72 hours. CBG: Recent Labs  Lab 07/05/21 2038 07/06/21 0001 07/06/21 0432 07/06/21 0821 07/06/21 0844  GLUCAP 155* 106* 77 62* 136*    Lipid Profile: No results for input(s): CHOL, HDL, LDLCALC, TRIG, CHOLHDL, LDLDIRECT in the last 72 hours. Thyroid Function Tests: No results for input(s): TSH, T4TOTAL, FREET4, T3FREE, THYROIDAB in the last 72 hours. Anemia Panel: No results for input(s): VITAMINB12, FOLATE, FERRITIN, TIBC, IRON, RETICCTPCT in the last 72 hours. Sepsis Labs: No results for input(s): PROCALCITON, LATICACIDVEN in the last 168 hours.  Recent Results (from the past 240 hour(s))  Culture, blood (routine x 2)     Status: None   Collection Time: 06/26/21 12:22 PM   Specimen: BLOOD  Result Value Ref Range Status   Specimen Description BLOOD RIGHT ANTECUBITAL  Final   Special Requests   Final    BOTTLES DRAWN AEROBIC AND ANAEROBIC Blood Culture adequate volume   Culture   Final    NO GROWTH 5 DAYS Performed at [facility], [address], [place], [place] [address]    Report Status 07/01/2021 FINAL  Final  Culture, blood (routine x 2)     Status: None   Collection Time: 06/26/21 12:34 PM   Specimen: BLOOD  Result Value Ref Range Status    Specimen Description BLOOD RIGHT ANTECUBITAL  Final   Special Requests   Final    BOTTLES DRAWN AEROBIC AND ANAEROBIC Blood Culture adequate volume   Culture   Final    NO GROWTH 5 DAYS Performed at [facility], [place] [address], [place], [place] [address]    Report Status 07/01/2021 FINAL  Final      Radiology Studies: No results found.   LOS: 74 days   Antonieta Pert, MD [facility]  07/06/2021, 10:50 AM

## 2021-07-06 NOTE — Progress Notes (Signed)
Hypoglycemic Event  CBG: 65 mg/dL  Treatment: D50 25 mL (12.5 gm)  Symptoms: None  Follow-up CBG: Time:2107 CBG Result:112  Possible Reasons for Event: Unknown  Comments/MD notified: Yes, MD Chotiner, see new orders    Babs Sciara

## 2021-07-06 NOTE — Progress Notes (Signed)
Dr. Lupita Leash notified about patient's dropping CBG's and that the cortrak is still clogged and the patient is not able to receive any of her meds.

## 2021-07-06 NOTE — Telephone Encounter (Signed)
Unable to reach pt. Unable to leave voicemail.

## 2021-07-06 NOTE — Progress Notes (Signed)
ANTICOAGULATION CONSULT NOTE - Initial Consult  Pharmacy Consult for Heparin Indication:  h/o CVA and chronic UE thrombus  Allergies  Allergen Reactions   Sensipar [Cinacalcet]     Per MAR   Statins     Per [facility]    Patient Measurements: Height: '5\' 7"'$  (170.2 cm) Weight: 102 kg (224 lb 13.9 oz) IBW/kg (Calculated) : 61.6 Heparin Dosing Weight:  85 kg  Vital Signs: Temp: 97.8 F (36.6 C) (09/11 0947) Temp Source: Oral (09/11 0947) BP: 134/61 (09/11 0947) Pulse Rate: 99 (09/11 0947)  Labs: Recent Labs    07/04/21 0539  HGB 8.7*  HCT 27.5*  PLT 174  CREATININE 3.23*    Estimated Creatinine Clearance: 19.1 mL/min (A) (by C-G formula based on SCr of 3.23 mg/dL (H)).   Medical History: Past Medical History:  Diagnosis Date   Chronic kidney disease    dialysis M-W-F - dialysis cath located in right side of chest   Diabetes mellitus without complication ([place])    type 2 - no meds    HLD (hyperlipidemia)    diet controlled   Hypertension    no meds    Assessment:  Anticoag: Apix PTA for hx CVA 02/2021 and chronic UE thrombus (noted on 6/23 fistulogram). Was held for GIB w/u mid-August.  - Eliquis resumed 8/21-9/5, held 48hr for HD cath revision, resumed 9/9-9/10 AM (Cortrak clotted off) - 9/11: Start IV heparin due to inability to [employer]. LD 9/10 AM. Hgb 8.7 trending downward  Goal of Therapy:  aPTT 66-102 Heparin level 0.3-0.7 units/ml Monitor platelets by anticoagulation protocol: Yes   Plan:  D/c Eliquis (LD 9/10 AM)  Start IV heparin 4000 unit bolus. Heparin infusion 1050 units/hr (CVA 4 months ago) Check aPTT and HL in 8 hrs Daily HL, apTT and CBC   Zachari Alberta S. Alford Highland, PharmD, BCPS Clinical Staff Pharmacist [email or web address]  Alford Highland, Latacha Texeira Stillinger 07/06/2021,2:51 PM

## 2021-07-07 ENCOUNTER — Inpatient Hospital Stay (HOSPITAL_COMMUNITY): Payer: Medicare Other

## 2021-07-07 DIAGNOSIS — E43 Unspecified severe protein-calorie malnutrition: Secondary | ICD-10-CM | POA: Diagnosis not present

## 2021-07-07 DIAGNOSIS — R1312 Dysphagia, oropharyngeal phase: Secondary | ICD-10-CM | POA: Diagnosis not present

## 2021-07-07 DIAGNOSIS — K922 Gastrointestinal hemorrhage, unspecified: Secondary | ICD-10-CM | POA: Diagnosis not present

## 2021-07-07 DIAGNOSIS — R638 Other symptoms and signs concerning food and fluid intake: Secondary | ICD-10-CM | POA: Diagnosis not present

## 2021-07-07 DIAGNOSIS — R627 Adult failure to thrive: Secondary | ICD-10-CM | POA: Diagnosis not present

## 2021-07-07 DIAGNOSIS — N186 End stage renal disease: Secondary | ICD-10-CM | POA: Diagnosis not present

## 2021-07-07 LAB — COMPREHENSIVE METABOLIC PANEL
ALT: 6 U/L (ref 0–44)
AST: 28 U/L (ref 15–41)
Albumin: 1.8 g/dL — ABNORMAL LOW (ref 3.5–5.0)
Alkaline Phosphatase: 90 U/L (ref 38–126)
Anion gap: 12 (ref 5–15)
BUN: 20 mg/dL (ref 8–23)
CO2: 23 mmol/L (ref 22–32)
Calcium: 8.2 mg/dL — ABNORMAL LOW (ref 8.9–10.3)
Chloride: 96 mmol/L — ABNORMAL LOW (ref 98–111)
Creatinine, Ser: 3.17 mg/dL — ABNORMAL HIGH (ref 0.44–1.00)
GFR, Estimated: 15 mL/min — ABNORMAL LOW (ref >60)
Glucose, Bld: 73 mg/dL (ref 70–99)
Potassium: 4 mmol/L (ref 3.5–5.1)
Sodium: 131 mmol/L — ABNORMAL LOW (ref 135–145)
Total Bilirubin: 0.4 mg/dL (ref 0.3–1.2)
Total Protein: 5.8 g/dL — ABNORMAL LOW (ref 6.5–8.1)

## 2021-07-07 LAB — CBC
HCT: 27.1 % — ABNORMAL LOW (ref 36.0–46.0)
HCT: 27.9 % — ABNORMAL LOW (ref 36.0–46.0)
Hemoglobin: 8.5 g/dL — ABNORMAL LOW (ref 12.0–15.0)
Hemoglobin: 8.9 g/dL — ABNORMAL LOW (ref 12.0–15.0)
MCH: 29.4 pg (ref 26.0–34.0)
MCH: 29.5 pg (ref 26.0–34.0)
MCHC: 31.4 g/dL (ref 30.0–36.0)
MCHC: 31.9 g/dL (ref 30.0–36.0)
MCV: 92.1 fL (ref 80.0–100.0)
MCV: 94.1 fL (ref 80.0–100.0)
Platelets: 241 10*3/uL (ref 150–400)
Platelets: 262 10*3/uL (ref 150–400)
RBC: 2.88 MIL/uL — ABNORMAL LOW (ref 3.87–5.11)
RBC: 3.03 MIL/uL — ABNORMAL LOW (ref 3.87–5.11)
RDW: 17.4 % — ABNORMAL HIGH (ref 11.5–15.5)
RDW: 17.8 % — ABNORMAL HIGH (ref 11.5–15.5)
WBC: 10 10*3/uL (ref 4.0–10.5)
WBC: 8.2 10*3/uL (ref 4.0–10.5)
nRBC: 0 % (ref 0.0–0.2)
nRBC: 0.4 % — ABNORMAL HIGH (ref 0.0–0.2)

## 2021-07-07 LAB — GLUCOSE, CAPILLARY
Glucose-Capillary: 102 mg/dL — ABNORMAL HIGH (ref 70–99)
Glucose-Capillary: 117 mg/dL — ABNORMAL HIGH (ref 70–99)
Glucose-Capillary: 118 mg/dL — ABNORMAL HIGH (ref 70–99)
Glucose-Capillary: 145 mg/dL — ABNORMAL HIGH (ref 70–99)
Glucose-Capillary: 69 mg/dL — ABNORMAL LOW (ref 70–99)
Glucose-Capillary: 73 mg/dL (ref 70–99)
Glucose-Capillary: 81 mg/dL (ref 70–99)

## 2021-07-07 LAB — APTT
aPTT: >200 seconds (ref 24–36)
aPTT: >200 seconds (ref 24–36)
aPTT: >200 seconds (ref 24–36)
aPTT: 84 seconds — ABNORMAL HIGH (ref 24–36)

## 2021-07-07 LAB — HEPATITIS B SURFACE ANTIGEN: Hepatitis B Surface Ag: NONREACTIVE

## 2021-07-07 LAB — HEPATITIS B SURFACE ANTIBODY,QUALITATIVE: Hep B S Ab: REACTIVE — AB

## 2021-07-07 LAB — HEPARIN LEVEL (UNFRACTIONATED): Heparin Unfractionated: 0.87 IU/mL — ABNORMAL HIGH (ref 0.30–0.70)

## 2021-07-07 MED ORDER — SODIUM CHLORIDE 0.9 % IV SOLN: 100.0000 mL | INTRAVENOUS | Status: DC | PRN

## 2021-07-07 MED ORDER — LIDOCAINE HCL (PF) 1 % IJ SOLN
5.0000 mL | INTRAMUSCULAR | Status: DC | PRN
Start: 2021-07-07 — End: 2021-07-08

## 2021-07-07 MED ORDER — ALTEPLASE 2 MG IJ SOLR: 2.0000 mg | Freq: Once | INTRAMUSCULAR | Status: DC | PRN

## 2021-07-07 MED ORDER — HEPARIN SODIUM (PORCINE) 1000 UNIT/ML DIALYSIS
1000.0000 [IU] | INTRAMUSCULAR | Status: DC | PRN
  Filled 2021-07-07: qty 1

## 2021-07-07 MED ORDER — LIDOCAINE-PRILOCAINE 2.5-2.5 % EX CREA
1.0000 | TOPICAL_CREAM | CUTANEOUS | Status: DC | PRN
Start: 2021-07-07 — End: 2021-07-08

## 2021-07-07 MED ORDER — HEPARIN (PORCINE) 25000 UT/250ML-% IV SOLN
1000.0000 [IU]/h | INTRAVENOUS | Status: AC
  Administered 2021-07-07 (×2): 850 [IU]/h via INTRAVENOUS
  Administered 2021-07-08 – 2021-07-09 (×2): 1000 [IU]/h via INTRAVENOUS
  Filled 2021-07-07 (×2): qty 250

## 2021-07-07 MED ORDER — PENTAFLUOROPROP-TETRAFLUOROETH EX AERO
1.0000 | INHALATION_SPRAY | CUTANEOUS | Status: DC | PRN
Start: 2021-07-07 — End: 2021-07-08

## 2021-07-07 MED ORDER — DEXTROSE 50 % IV SOLN
INTRAVENOUS | Status: AC
  Administered 2021-07-07: 25 mL
  Filled 2021-07-07: qty 50

## 2021-07-07 MED ORDER — DEXTROSE 10 % IV SOLN: INTRAVENOUS | Status: DC

## 2021-07-07 NOTE — Procedures (Signed)
Cortrak  Tube Type:  Cortrak - 43 inches Tube Location:  Left nare Initial Placement:  Stomach Secured by: Bridle Technique Used to Measure Tube Placement:  Marking at nare/corner of mouth Cortrak Secured At:  74 cm  Cortrak Tube Team Note:  Consult received to place a Cortrak feeding tube.   X-ray is required, abdominal x-ray has been ordered by the Cortrak team. Please confirm tube placement before using the Cortrak tube.   If the tube becomes dislodged please keep the tube and contact the Cortrak team at [email or web address] ([email or web address]) for replacement.  If after hours and replacement cannot be delayed, place a NG tube and confirm placement with an abdominal x-ray.   Koleen Distance MS, RD, LDN Please refer to [facility] for RD and/or RD on-call/weekend/after hours pager

## 2021-07-07 NOTE — Progress Notes (Addendum)
ANTICOAGULATION CONSULT NOTE Pharmacy Consult for Heparin Indication:  h/o CVA and chronic UE thrombus  Allergies  Allergen Reactions   Sensipar [Cinacalcet]     Per MAR   Statins     Per [facility]    Patient Measurements: Height: '5\' 7"'$  (170.2 cm) Weight: 102 kg (224 lb 13.9 oz) IBW/kg (Calculated) : 61.6 Heparin Dosing Weight:  85 kg  Vital Signs: Temp: 98 F (36.7 C) (09/12 1044) Temp Source: Axillary (09/12 1044) BP: 136/47 (09/12 1044) Pulse Rate: 93 (09/12 1044)  Labs: Recent Labs    07/07/21 0027 07/07/21 0356 07/07/21 0421 07/07/21 1411  HGB 8.9*  --   --  8.5*  HCT 27.9*  --   --  27.1*  PLT 241  --   --  262  APTT >200* >200* >200* 84*  HEPARINUNFRC 0.87*  --   --   --   CREATININE 3.17*  --   --   --      Estimated Creatinine Clearance: 19.4 mL/min (A) (by C-G formula based on SCr of 3.17 mg/dL (H)).  Assessment: 75 y.o. female with hx CVA and chronic UE thrombus, Eliquis on hold, continues on heparin  PTT now therapeutic this PM  Goal of Therapy:  aPTT 66-102 Heparin level 0.3-0.7 units/ml Monitor platelets by anticoagulation protocol: Yes   Plan:  Heparin at 850 units / hr Follow up AM labs  Thank you Anette Guarneri, PharmD 07/07/2021,4:11 PM

## 2021-07-07 NOTE — Progress Notes (Signed)
[facility] PROGRESS NOTE  Amy [name] [id number] DOB: 1946-05-11 DOA: 05/19/2021 PCP: [name], MD  Status:  Remains inpatient appropriate because:Unsafe d/c plan, IV treatments appropriate due to intensity of illness or inability to take PO, and Inpatient level of care appropriate due to severity of illness  Dispo: The patient is from: Home              Anticipated d/c is to: SNF              Patient currently is medically stable to d/c.   Difficult to place patient Yes              Barriers to discharge: Dialysis patient with markedly decreased mobility-we will need to prove tolerance of sitting in chair for hemodialysis while here as well as tolerating sitting in wheelchair for up to 1 hour x 2 to replicate transport process.  Cannot discharge and proceed with outpatient hemodialysis unless this is accomplished.    Level of care: Med-Surg  Code Status: DNR Family Communication: Spoke with patient's son Amy [name] this morning on 9/12.  Updated him on current status regarding patient's lack of eating any diet but that she did tolerate dialysis in chair.  Son seems to think that he would eat if food brought from home which was supposedly in process per his family (although not documented that she has eaten anything).  I reinforced that she would be unable to discharge to a skilled facility with the feeding tube in the nose and given the fact she is not eating the day still want a feeding tube with everything else going on and he stated yes.  He is aware surgical team will be contacting him later in the day. DVT prophylaxis: Eliquis currently on hold secondary to need for placement of [place] vaccination status: Amy [name] 02/26/2020, Pfizer 04/10/2021    HPI: 75 y.o. 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  Patient presented to the ED on 7/25 with dark stool.  On presentation, hemoglobin was 9.6; GI was consulted.  She underwent EGD which was normal and flexible sigmoidoscopy which showed nonbleeding distal rectal ulcers/stercoral ulcers.  Subsequently, GI signed off.  Neurology was also consulted for altered mental status; MRI brain was similar to prior MRI at [facility] with a large right MCA stroke.   Nephrology has been following for dialysis needs; nephrology is stating that patient will not be accepted back to outpatient dialysis at this time due to her underlying confusion, weakness and inability to sit in a dialysis recliner as an outpatient.   Due to her poor oral intake, family wanted a PEG tube placement.  IR tried but was unable to place because of overlying transverse colon.   General surgery was consulted for G-tube placement but family currently undecided.  She has an NG tube in place.   Palliative care following for goals of care discussion.  Subjective: Patient alert.  States no problems.  Mildly confused.  Objective: Vitals:   07/06/21 2048 07/07/21 0444  BP: (!) 121/27 (!) 109/44  Pulse: 99 (!) 101  Resp: 19 19  Temp: 98.3 F (36.8 C) 98.1 F (36.7 C)  SpO2: 96% 100%    Intake/Output Summary (Last 24  hours) at 07/07/2021 0835 Last data filed at 07/07/2021 0600 Gross per 24 hour  Intake 695.41 ml  Output --  Net 695.41 ml   Filed Weights   07/02/21 2110 07/03/21 2040 07/05/21 1016  Weight: 102 kg 102 kg 102 kg    Exam:  Constitutional: NAD, calm, alert Respiratory: Lung sounds clear to auscultation, room air, no increased work of breathing Cardiovascular: S1-S2, pulse regular, stable peripheral edema Abdomen: Soft and nondistended with normoactive bowel sounds.[name] LBM 9/08-cortrack for feeding-extremely poor oral  intake Neurologic: CN 2-12 grossly intact -Strength 3/5 on the right. 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  Very pleasant   Assessment/Plan: Acute problems: Acute on chronic lower GI bleeding from stercoral ulcers -Patient presented with anemia,  underwent EGD which was normal and flex sigmoidoscopy with clean base, nonbleeding distal rectal ulcers (stercoral ulcers). Gastroenterology now signed off  -Hemoglobin stable (7-8).     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  Merrily consistent with encephalopathy Lipid panel and hemoglobin A1c within normal limits.  Continue Lipitor Diet as per SLP recommendations.  Extremely poor to no oral intake Cortrak tube in place for enteral feedings Has not eaten any oral food for the past 4 days therefore will resume continuous tube feedings.  Of note patient does not have true dysphagia she is only refusing hospital food because she does not like the taste.  I spoke with her son today who reports his cousins were supposed to be bringing by food from home and feeding the patient-based on documentation in the intake and output section it does not appear this is occurring but family may be coming in without nurses being aware. Of note patient states she does not want nasal feeding tube or any feeding tube.  Told the Surgical app that she did not want a feeding tube placed in the operating room-surgical team felt she was oriented-in my previous evaluations of the patient her orientation has been variable and it is unclear whether she has capacity to make decisions regarding her medical care.  I have asked the psychiatric team to evaluate the patient regarding  capacity.  If she has capacity I will discontinue nasogastric tube and tube feedings and begin bed search for SNF.  I will also explained to the son that since she has capacity she has the right to refuse a feeding tube and that emphasis should be on bringing patient food from home.  ESRD on HD Per Nephro Patient is not a candidate for continued outpatient HD given her confusion, weakness and inability to sit up in a chair for HD outpatient.  But as of 9/12 patient has been tolerating dialysis in chair 9/8 patient tolerating HD in chair well noting she is sleeping. Nephrology suggested transition to hospice care but family resistant.  PMT spoke with son by phone recently and he insisted that as long as his mother could speak that she would remain full aggressive care.. Continue Neurontin and Oxy IR for suspected pain during dialysis TDC placed 9/7 and is functional  Hyperkalemia/hypophosphatemia Management per nephrology HD -supplementation as needed   Hypotension Blood pressure currently is stable on midodrine.   Failure to thrive /Generalized deconditioning  /very poor oral intake PEG tube was  desired by family but could not be placed because of overlying transverse colon.   Patient did not eat anything over the 4 days her tube feeding was held therefore tube feeding resumed at previous rate on 9/5 Discussed with general surgery on 8/31 and if patient can tolerate dialysis in chair and plan is to discuss possible open G-tube placed  Constipation Patient was on chronic narcotics prior to admission and these remain in place Patient was on 40 g Chronulac daily prn.  Continue scheduled Chronulac 20 g daily and presentation with rectal ulcers likely secondary to constipation chronic  Type 2 diabetes mellitus Episodes of hypoglycemia Hemoglobin A1c 5.1.  Diet controlled at baseline  Chronic thrombus left upper extremity 9/9 will resume Eliquis post TDC placed 9/7 and is functional Elevate  as appropriate  Stage II mid coccyx pressure injury, present on admission Continue wound care    GERD Could also be contributing to patient's poor oral intake Increased frequency of Protonix to BID sitter adding Pepcid     Data Reviewed: Basic Metabolic Panel: Recent Labs  Lab 07/01/21 0647 07/04/21 0539 07/07/21 0027  NA 130* 132* 131*  K 4.6 4.5 4.0  CL 92* 92* 96*  CO2 '25 28 23  '$ GLUCOSE 90 133* 73  BUN 48* 28* 20  CREATININE 4.88* 3.23* 3.17*  CALCIUM 8.3* 8.3* 8.2*  PHOS 3.7 3.0  --    Liver Function Tests: Recent Labs  Lab 07/01/21 0647 07/04/21 0539 07/07/21 0027  AST  --   --  28  ALT  --   --  6  ALKPHOS  --   --  90  BILITOT  --   --  0.4  PROT  --   --  5.8*  ALBUMIN 1.8* 1.8* 1.8*   No results for input(s): LIPASE, AMYLASE in the last 168 hours. No results for input(s): AMMONIA in the last 168 hours. CBC: Recent Labs  Lab 07/01/21 0808 07/04/21 0539 07/07/21 0027  WBC 12.9* 10.9* 10.0  NEUTROABS  --  7.9*  --   HGB 9.1* 8.7* 8.9*  HCT 28.8* 27.5* 27.9*  MCV 95.4 92.9 92.1  PLT 336 174 241    CBG: Recent Labs  Lab 07/06/21 2106 07/07/21 0010 07/07/21 0415 07/07/21 0752 07/07/21 0832  GLUCAP 112* 81 73 69* 118*     Scheduled Meds:  atorvastatin  40 mg Per Tube Daily   chlorhexidine  15 mL Mouth Rinse BID   Chlorhexidine Gluconate Cloth  6 each Topical Daily   collagenase   Topical Daily   [START ON 07/10/2021] darbepoetin (ARANESP) injection - DIALYSIS  200 mcg Intravenous Q Thu-HD   feeding supplement (PROSource TF)  45 mL Per Tube Daily   gabapentin  100 mg Per Tube Q12H   insulin aspart  0-6 Units Subcutaneous Q4H   lactulose  20 g Per Tube Daily   mouth rinse  15 mL Mouth Rinse q12n4p   metaxalone  400 mg Per Tube TID   midodrine  10 mg Per Tube TID WC   mirtazapine  7.5 mg Per Tube QHS   multivitamin  1 tablet Per Tube QHS   pantoprazole sodium  40 mg Per Tube Daily   sodium chloride flush  10-40 mL Intracatheter Q12H    sodium chloride flush  3 mL Intravenous Q12H   thiamine  100 mg Per Tube Daily   venlafaxine  37.5 mg Per Tube BID   Continuous Infusions:  sodium chloride     dextrose  50 mL/hr at 07/07/21 [id number]   feeding supplement (OSMOLITE 1.2 CAL) Stopped (07/05/21 1846)   heparin 850 Units/hr (07/07/21 [id number])   iron sucrose 100 mg (07/05/21 1230)    Principal Problem:   GI bleed Active Problems:   Essential hypertension   Type 2 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, with long-term current use of insulin (HCC)   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   ESRD on dialysis ([place])   Dyslipidemia   Hypokalemia   Stage 2 skin ulcer of sacral region [facility])   Cerebral thrombosis with cerebral infarction   Protein-calorie malnutrition, severe   Acute GI bleeding   Oropharyngeal dysphagia   Failure to thrive in adult   Inadequate oral intake   Clogged feeding tube   Fever   Consultants: Vascular surgery Nephrology Gastroenterology Neurology Palliative medicine General surgery  Procedures: EEG Echocardiogram Cortrack EGD  Antibiotics: Ceftriaxone 8/3 through 8/5   Time spent: 25 minutes    Erin Hearing ANP  [facility] 7 am - 330 pm/M-F for direct patient care and secure chat Please refer to Amion for contact info 48  days

## 2021-07-07 NOTE — Progress Notes (Signed)
Patient ID: [name], female   DOB: [birth year], 75 y.o.   MRN: [id number]    Progress Note from the Palliative Medicine Team at [facility]   Patient Name: Amy [name]        Date: 07/07/2021 DOB: 08/04/1946  Age: 75 y.o. MRN#: [id number] Attending Physician: Antonieta Pert, MD Primary Care Physician: [name], MD Admit Date: 05/19/2021   Medical records reviewed   75 y.o. 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   03/26/21 Flex sig, at [facility]: multiple rectal ulcers.  Path: Polypoid fragment of colorectal mucosa with crypt architectural disorder including slight hyperplastic features, mild fibromuscular hyperplasia of the lamina propria, and adherent necroinflammatory debris suggestive of ulcer exudate. 05/21/21 Flex sig: clean based, non-bleeding distal rectl ulcers (stercoral ulcer).  Blood clots and solid stool limited visualization.   05/21/21 EGD: Normal.     Patient remains confused, unable to follow commands with poor p.o. intake.  Cor-trac in place but not functioning/clogged.  Pain medication changed yesterday to solution for sublingual administration.    This NP visited patient at the bedside as a follow up for palliative medicine needs.    Discussed with Dr Lupita Leash.  Cor-trac is currently non functioning/clogged.    Re consult surgery for possible PEG placement today.  I spoke to patient's son [name] by telephone.  Continue conversation regarding current medical situation.  Education offered on risks and benefits of artificial feeding and hydration.  Family verbalized an understanding of the seriousness of the medical situation, they remain hopeful for improvement and are open to all offered and available medical interventions to prolong life.  Including ongoing outpatient dialysis and placement of PEG tube for  nutritional support.  Will sign off.  Please re-consult if PMT can be of assistant in the future care plan.   PMT information left with family, encouraged them to call PMT with any questions or concerns into the future.  Total time spent on the unit was 25 minutes  Greater than 50% of the time was spent in counseling and coordination of care  Wadie Lessen NP  Palliative Medicine Team Team Phone # [phone] Pager [phone]

## 2021-07-07 NOTE — Progress Notes (Signed)
ANTICOAGULATION CONSULT NOTE Pharmacy Consult for Heparin Indication:  h/o CVA and chronic UE thrombus  Allergies  Allergen Reactions   Sensipar [Cinacalcet]     Per MAR   Statins     Per [facility]    Patient Measurements: Height: '5\' 7"'$  (170.2 cm) Weight: 102 kg (224 lb 13.9 oz) IBW/kg (Calculated) : 61.6 Heparin Dosing Weight:  85 kg  Vital Signs: Temp: 98.1 F (36.7 C) (09/12 0444) Temp Source: Oral (09/12 0444) BP: 109/44 (09/12 0444) Pulse Rate: 101 (09/12 0444)  Labs: Recent Labs    07/04/21 0539 07/07/21 0027 07/07/21 0356  HGB 8.7* 8.9*  --   HCT 27.5* 27.9*  --   PLT 174 241  --   APTT  --  >200* >200*  HEPARINUNFRC  --  0.87*  --   CREATININE 3.23* 3.17*  --      Estimated Creatinine Clearance: 19.4 mL/min (A) (by C-G formula based on SCr of 3.17 mg/dL (H)).     Assessment:  75 y.o. female with hx CVA and chronic UE thrombus, Eliquis on hold, for heparin   Goal of Therapy:  aPTT 66-102 Heparin level 0.3-0.7 units/ml Monitor platelets by anticoagulation protocol: Yes   Plan:  Hold heparin x 90 min, then decrease Heparin 850 units/hr aPTT in 8 hours  Caryl Pina 07/07/2021,4:56 AM

## 2021-07-07 NOTE — Progress Notes (Addendum)
[facility] Progress Note   Subjective:    Seen and examined patient at bedside. Oriented to herself and location. Denies SOB and CP. Plan for HD 9/13.  Objective Vitals:   07/06/21 1830 07/06/21 2048 07/07/21 0444 07/07/21 1044  BP: (!) 130/54 (!) 121/27 (!) 109/44 (!) 136/47  Pulse: 93 99 (!) 101 93  Resp: _0 Temp: 98 F (36.7 C) 98.3 F (36.8 C) 98.1 F (36.7 C) 98 F (36.7 C)  TempSrc:  Oral Oral Axillary  SpO2: 100% 96% 100% 100%  Weight:      Height:       Physical Exam General: Chronically ill appearing elderly female; NAD Heart:RRR, no mrg Lungs:CTAB anterolaterally, nml WOB on RA  Abdomen: Soft and non-tender; active bowel sounds Extremities: 2+ edema in upper extremities/hips/thighs, 1+ in calves/feet Dialysis Access: [facility]   Filed Weights   07/02/21 2110 07/03/21 2040 07/05/21 1016  Weight: 102 kg 102 kg 102 kg    Intake/Output Summary (Last 24 hours) at 07/07/2021 1127 Last data filed at 07/07/2021 0600 Gross per 24 hour  Intake 695.41 ml  Output --  Net 695.41 ml    Additional Objective Labs: Basic Metabolic Panel: Recent Labs  Lab 07/01/21 0647 07/04/21 0539 07/07/21 0027  NA 130* 132* 131*  K 4.6 4.5 4.0  CL 92* 92* 96*  CO2 _1 GLUCOSE 90 133* 73  BUN 48* 28* 20  CREATININE 4.88* 3.23* 3.17*  CALCIUM 8.3* 8.3* 8.2*  PHOS 3.7 3.0  --    Liver Function Tests: Recent Labs  Lab 07/01/21 0647 07/04/21 0539 07/07/21 0027  AST  --   --  28  ALT  --   --  6  ALKPHOS  --   --  90  BILITOT  --   --  0.4  PROT  --   --  5.8*  ALBUMIN 1.8* 1.8* 1.8*   No results for input(s): LIPASE, AMYLASE in the last 168 hours. CBC: Recent Labs  Lab 07/01/21 0808 07/04/21 0539 07/07/21 0027  WBC 12.9* 10.9* 10.0  NEUTROABS  --  7.9*  --   HGB 9.1* 8.7* 8.9*  HCT 28.8* 27.5* 27.9*  MCV 95.4 92.9 92.1  PLT 336 174 241   Blood Culture    Component Value Date/Time   SDES BLOOD RIGHT ANTECUBITAL 06/26/2021 1234    SPECREQUEST  06/26/2021 1234    BOTTLES DRAWN AEROBIC AND ANAEROBIC Blood Culture adequate volume   CULT  06/26/2021 1234    NO GROWTH 5 DAYS Performed at [facility], [place] [address], [place], [place] [address]    REPTSTATUS 07/01/2021 FINAL 06/26/2021 1234    Cardiac Enzymes: No results for input(s): CKTOTAL, CKMB, CKMBINDEX, TROPONINI in the last 168 hours. CBG: Recent Labs  Lab 07/06/21 2106 07/07/21 0010 07/07/21 0415 07/07/21 0752 07/07/21 0832  GLUCAP 112* 81 73 69* 118*   Iron Studies: No results for input(s): IRON, TIBC, TRANSFERRIN, FERRITIN in the last 72 hours. Lab Results  Component Value Date   INR 0.9 03/10/2021   INR 1.0 01/26/2021   Studies/Results: No results found.  Medications:  sodium chloride     dextrose 50 mL/hr at 07/07/21 0828   feeding supplement (OSMOLITE 1.2 CAL) Stopped (07/05/21 1846)   heparin 850 Units/hr (07/07/21 1497)   iron sucrose 100 mg (07/05/21 1230)    atorvastatin  40 mg Per Tube Daily   chlorhexidine  15 mL Mouth Rinse BID  Chlorhexidine Gluconate Cloth  6 each Topical Daily   collagenase   Topical Daily   [START ON 07/10/2021] darbepoetin (ARANESP) injection - DIALYSIS  200 mcg Intravenous Q Thu-HD   feeding supplement (PROSource TF)  45 mL Per Tube Daily   gabapentin  100 mg Per Tube Q12H   insulin aspart  0-6 Units Subcutaneous Q4H   lactulose  20 g Per Tube Daily   mouth rinse  15 mL Mouth Rinse q12n4p   metaxalone  400 mg Per Tube TID   midodrine  10 mg Per Tube TID WC   mirtazapine  7.5 mg Per Tube QHS   multivitamin  1 tablet Per Tube QHS   pantoprazole sodium  40 mg Per Tube Daily   sodium chloride flush  10-40 mL Intracatheter Q12H   sodium chloride flush  3 mL Intravenous Q12H   thiamine  100 mg Per Tube Daily   venlafaxine  37.5 mg Per Tube BID    Dialysis Orders: AF MWF  3h 26mn 400/500    62.5kg    3K/2.5 Ca    P2  AVG HeRO   Hep none - Mircera 50 q 2 wks  (7/13)  Venofer 50 q wk -  Parsabiv 573mIV q HD  Assessment/Plan: AMS/FTT: Providers and palliative care met w/ family and discussed pt's decline. Due to declining mental and physical status we don't anticipate she will be able to do OP dialysis.  Pt will have to be able to sit in a chair for 4-6 hrs to qualify for OP HD w/ our group. Patient currently tolerating being in chair during HD. Moving forward, she needs to be in the chair for all HD treatments. If she continues to tolerate sitting in the chair for dialysis and cooperate, then will re-CLIP the patient as her OP HD unit will not take her back. HD Nurse Coordinator aware and following, stated prior to re-clipping her, we need to ensure patient nutritional status is stable.  Melena/Rectal bleeding - GI consulted. Hx rectal ulcers.  EGD 7/27 normal, flex sig with mucosal ulceration c/w sterocoral ulcers, non bleeding. Hgb 8.9, trending down again. ESRD: usual schedule MWF. Now on TTS while here.  HD nurse reported patient crying during treatment and repeating "ouch" when touched during HD.  Next HD on 07/08/21.  Dysphagia - Per primary, if patient continues to tolerate being in the chair for HD on 9/10 and 9/13, plan for G-tube placement via surgery. I spoke to both unit RN and Surgery who informed me patient is currently refusing the Gtube to be placed. Current mental status slowly improving but still has episodes of confusion. Please see #1 for requirements to re-clip patient back to OP HD. Plan for psych consult to determine competence. Access Issues/AVG clotted: Had f'gram 6/23 which showed chronic thrombus and was started on Eliquis. AVG clotted 9/2, unable to be declotted d/t arm contracture. S/P conversion to tunneled HD catheter 9/7 via IR-on Heparin drip, appreciate their assistance.  Hypertension/volume: BP stable, on midodrine 1016mID with significant overload on exam. Net UF Need to ^ UFG with HD as tolerated, can give IV albumin prn.  Net UF 4L removed.  Nutrition:  Albumin 1.7. Getting TFs via NG tube. 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  Anemia: Hgb down to 8.9, continue Aranesp-dose recently raised scheduled to receive 9/15..  Marland Kitchenispo: DNR/DNI. Poor prognosis. Palliative care has seen see #1. Appreciate their involvement. Although patient is currently tolerating being in the chair  for HD, we do not believe dialysis is adding to her quality of life. Per family communication, family wishes to continue. Have asked primary team that family members sit with patient during HD to see the reality of situation and to hopefully console her as she cries entire treatment, asking for help. Please see # 4.   Tobie Poet, NP [facility] 07/07/2021,11:27 AM  LOS: 48 days     Seen and examined independently.  Agree with note and exam as documented above by physician extender and as noted here.  Per team at bedside difficulty with tube clogged and they have needed Dextrose infusion for her glucose level  General elderly female in bed in no acute distress HEENT normocephalic atraumatic Neck supple trachea midline Lungs clear to auscultation bilaterally normal work of breathing at rest  Heart S1S2 no rub Abdomen soft nontender nondistended Extremities 1+ edema edema  Psych no anxiety or agitation  Neuro - year is 2020 per pt; oriented to person and "hospital" Left IJ tunn catheter  ESRD for HD per TTS schedule for now; palliative care involved and poor prognosis.  Must do HD in chair to be able to get outpatient HD  Dysphagia - per primary.  See pt refusing G tube per charting  Anemia and CKD - on ESA  Chronic hypotension - on midodrine  Appreciate primary team and palliative   Claudia Desanctis, MD 07/07/2021  2:31 PM

## 2021-07-07 NOTE — Progress Notes (Signed)
Paged cortrak team regarding clogged cortrak.  [name]

## 2021-07-07 NOTE — Progress Notes (Addendum)
47 Days Post-Op  Subjective: Asked to see patient again for placement of an open g-tube.  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  She is on HD.  She has passed for a diet, but states she does not like the food here and so doesn't want to eat this food.  Primary service has stated she could have food from home, but RN states no one has come to provide food for her.  Patient's mental status waxes and wanes, but she is oriented upon my engagement.  ROS: See above, otherwise other systems negative  Objective: Vital signs in last 24 hours: Temp:  [98 F (36.7 C)-98.3 F (36.8 C)] 98 F (36.7 C) (09/12 1044) Pulse Rate:  [93-101] 93 (09/12 1044) Resp:  [16-19] 16 (09/12 1044) BP: (109-136)/(27-54) 136/47 (09/12 1044) SpO2:  [96 %-100 %] 100 % (09/12 1044) Last BM Date: 07/06/21  Intake/Output from previous day: 09/11 0701 - 09/12 0700 In: 695.4 [I.V.:695.4] Out: -  Intake/Output this shift: No intake/output data recorded.  PE: Gen: NAD, laying in bed HEENT: disconjugate gaze, PERRL Heart: regular Lungs: CTAB Abd: soft, NT, Nd, +BS Ext: BUEs positioned over her abdomen and difficult to move.  BLE don't move on my exam but are symmetrical Neuro: disconjugate gaze, doesn't really move any of her extremities Psych: she is oriented to self, place, and situation currently.  She could tell me her name, she is in the hospital, and she is here because she had a CVA and is recovering.  Lab Results:  Recent Labs    07/07/21 0027  WBC 10.0  HGB 8.9*  HCT 27.9*  PLT 241   BMET Recent Labs    07/07/21 0027  NA 131*  K 4.0  CL 96*  CO2 23  GLUCOSE 73  BUN 20  CREATININE 3.17*  CALCIUM 8.2*   PT/INR No results for input(s): LABPROT, INR in the last 72 hours. CMP     Component Value Date/Time   NA 131 (L) 07/07/2021 0027   K 4.0 07/07/2021 0027   K 4.3 08/16/2012 1201   CL 96 (L) 07/07/2021 0027   CO2 23 07/07/2021 0027    GLUCOSE 73 07/07/2021 0027   BUN 20 07/07/2021 0027   CREATININE 3.17 (H) 07/07/2021 0027   CALCIUM 8.2 (L) 07/07/2021 0027   PROT 5.8 (L) 07/07/2021 0027   ALBUMIN 1.8 (L) 07/07/2021 0027   AST 28 07/07/2021 0027   ALT 6 07/07/2021 0027   ALKPHOS 90 07/07/2021 0027   BILITOT 0.4 07/07/2021 0027   GFRNONAA 15 (L) 07/07/2021 0027   Lipase     Component Value Date/Time   LIPASE 31 01/18/2021 1548       Studies/Results: No results found.  Anti-infectives: Anti-infectives (From admission, onward)    Start     Dose/Rate Route Frequency Ordered Stop   07/02/21 1116  ceFAZolin (ANCEF) IVPB 2g/100 mL premix        over 30 Minutes Intravenous Continuous PRN 07/02/21 1139 07/02/21 1116   07/02/21 1111  ceFAZolin (ANCEF) 2-4 GM/100ML-% IVPB       Note to Pharmacy: Domenick Bookbinder   : cabinet override      07/02/21 1111 07/02/21 2314   07/01/21 1215  ceFAZolin (ANCEF) IVPB 2g/100 mL premix        2 g 200 mL/hr over 30 Minutes Intravenous On call 07/01/21 0902 07/02/21 1215   05/29/21 1622  ceFAZolin (  ANCEF) 2-4 GM/100ML-% IVPB       Note to Pharmacy: Lytle Butte   : cabinet override      05/29/21 1622 05/30/21 0429   05/29/21 0000  cefTRIAXone (ROCEPHIN) 1 g in sodium chloride 0.9 % 100 mL IVPB  Status:  Discontinued        1 g 200 mL/hr over 30 Minutes Intravenous Every 24 hours 05/28/21 1801 05/28/21 1801   05/28/21 1830  cefTRIAXone (ROCEPHIN) 1 g in sodium chloride 0.9 % 100 mL IVPB        1 g 200 mL/hr over 30 Minutes Intravenous Every 24 hours 05/28/21 1801 05/30/21 1907   05/28/21 1630  ceFAZolin (ANCEF) IVPB 2g/100 mL premix        2 g 200 mL/hr over 30 Minutes Intravenous To Radiology 05/28/21 1537 05/29/21 1630        Assessment/Plan Malnutrition/refusal to eat Request has been made for [place] to see patient for placement of a feeding gastrostomy tube.  The patient's mental status apparently waxes and wanes, but is quite clear upon my entry.  She is able to tell  me her name, where she is, and why she is here.  When I discuss that we have been asked to see her for a feeding tube she very adamantly states "NO, I do NOT want that!"   When I asked if she was eating, she tells me no she isn't because she does not like the taste or smell of the food.  When I explained that if she doesn't eat and we do not place a feeding tube, that she can become more malnourished and ultimately die from this.  She states understanding and still again states that she does not want a feeding tube.  I attempted to call her son, and this went to voicemail.  I relayed this to the primary team.  They will have psych come see her to address capacity as we obviously can't proceed with this procedure if they patient does not want it and is able to make decisions.  There is also a concern that as her mental status waxes and wanes, if she is determined to not have capacity and we move forward, at the times she is more clear, she may try and pull her g-tube out based on how adamant she is that she does not want this tube.  That can certainly lead to post op risks and complications causing to significant infection and surgical intervention.  Will await capacity decision.  If she is deemed to not have capacity, we still can't proceed until we can contact the son for permission.  I have been told it is difficult to get in touch with the son as times.  We will follow.  FEN - NPO; however passed on 2023-07-09 for D3 diet, speech to re-eval VTE - heparin with HD ID - none currently  ESRD on HD CVA DM HTN HLD Significant deconditioning   LOS: 48 days    Henreitta Cea , [facility] 07/07/2021, 11:24 AM Please see Amion for pager number during day hours 7:00am-4:30pm or 7:00am -11:30am on weekends

## 2021-07-08 DIAGNOSIS — N186 End stage renal disease: Secondary | ICD-10-CM | POA: Diagnosis not present

## 2021-07-08 DIAGNOSIS — K922 Gastrointestinal hemorrhage, unspecified: Secondary | ICD-10-CM | POA: Diagnosis not present

## 2021-07-08 DIAGNOSIS — E43 Unspecified severe protein-calorie malnutrition: Secondary | ICD-10-CM | POA: Diagnosis not present

## 2021-07-08 DIAGNOSIS — R627 Adult failure to thrive: Secondary | ICD-10-CM | POA: Diagnosis not present

## 2021-07-08 LAB — CBC
HCT: 28.2 % — ABNORMAL LOW (ref 36.0–46.0)
Hemoglobin: 8.9 g/dL — ABNORMAL LOW (ref 12.0–15.0)
MCH: 29.6 pg (ref 26.0–34.0)
MCHC: 31.6 g/dL (ref 30.0–36.0)
MCV: 93.7 fL (ref 80.0–100.0)
Platelets: 265 10*3/uL (ref 150–400)
RBC: 3.01 MIL/uL — ABNORMAL LOW (ref 3.87–5.11)
RDW: 17.7 % — ABNORMAL HIGH (ref 11.5–15.5)
WBC: 9.3 10*3/uL (ref 4.0–10.5)
nRBC: 0.3 % — ABNORMAL HIGH (ref 0.0–0.2)

## 2021-07-08 LAB — GLUCOSE, CAPILLARY
Glucose-Capillary: 124 mg/dL — ABNORMAL HIGH (ref 70–99)
Glucose-Capillary: 133 mg/dL — ABNORMAL HIGH (ref 70–99)
Glucose-Capillary: 163 mg/dL — ABNORMAL HIGH (ref 70–99)
Glucose-Capillary: 166 mg/dL — ABNORMAL HIGH (ref 70–99)
Glucose-Capillary: 174 mg/dL — ABNORMAL HIGH (ref 70–99)
Glucose-Capillary: 176 mg/dL — ABNORMAL HIGH (ref 70–99)
Glucose-Capillary: 188 mg/dL — ABNORMAL HIGH (ref 70–99)

## 2021-07-08 LAB — HEPARIN LEVEL (UNFRACTIONATED)
Heparin Unfractionated: 0.26 IU/mL — ABNORMAL LOW (ref 0.30–0.70)
Heparin Unfractionated: 0.6 IU/mL (ref 0.30–0.70)

## 2021-07-08 LAB — HEPATITIS B CORE ANTIBODY, TOTAL: Hep B Core Total Ab: REACTIVE — AB

## 2021-07-08 LAB — HEPATITIS B SURFACE ANTIBODY, QUANTITATIVE: Hep B S AB Quant (Post): >1000 m[IU]/mL (ref >9.9)

## 2021-07-08 MED ORDER — OSMOLITE 1.5 CAL PO LIQD
1000.0000 mL | ORAL | Status: DC
  Administered 2021-07-08 – 2021-07-17 (×11): 1000 mL
  Filled 2021-07-08 (×13): qty 1000

## 2021-07-08 MED ORDER — ALBUMIN HUMAN 25 % IV SOLN
25.0000 g | Freq: Once | INTRAVENOUS | Status: AC
  Administered 2021-07-08: 25 g via INTRAVENOUS
  Filled 2021-07-08: qty 100

## 2021-07-08 NOTE — Progress Notes (Signed)
[facility] Progress Note   Subjective:    Patient seen and examined at bedside. Patient completed HD and tolerated net UF 4L. She was unable to be in chair for HD d/t restraints applied. Patient currently oriented to herself, location, and year. Denies SOB and CP.   Objective Vitals:   07/08/21 1130 07/08/21 1200 07/08/21 1230 07/08/21 1250  BP: (!) 148/70 129/66 125/62 (!) 127/51  Pulse: (!) 104 (!) 106 (!) 106 (!) 107  Resp:    16  Temp:    98 F (36.7 C)  TempSrc:    Oral  SpO2:    94%  Weight:      Height:       Physical Exam General: Chronically ill appearing elderly female; NAD Heart:RRR, no mrg Lungs:CTAB anterolaterally, nml WOB on RA  Abdomen: Soft and non-tender; active bowel sounds Extremities: 2+ edema in upper extremities/hips/thighs, 1+ in calves/feet Dialysis Access: [facility]    Filed Weights   07/07/21 2100 07/08/21 0815 07/08/21 0850  Weight: 100.2 kg 101.2 kg 102.1 kg    Intake/Output Summary (Last 24 hours) at 07/08/2021 1319 Last data filed at 07/08/2021 1230 Gross per 24 hour  Intake 0 ml  Output 4017 ml  Net -4017 ml    Additional Objective Labs: Basic Metabolic Panel: Recent Labs  Lab 07/04/21 0539 07/07/21 0027  NA 132* 131*  K 4.5 4.0  CL 92* 96*  CO2 28 23  GLUCOSE 133* 73  BUN 28* 20  CREATININE 3.23* 3.17*  CALCIUM 8.3* 8.2*  PHOS 3.0  --    Liver Function Tests: Recent Labs  Lab 07/04/21 0539 07/07/21 0027  AST  --  28  ALT  --  6  ALKPHOS  --  90  BILITOT  --  0.4  PROT  --  5.8*  ALBUMIN 1.8* 1.8*   No results for input(s): LIPASE, AMYLASE in the last 168 hours. CBC: Recent Labs  Lab 07/04/21 0539 07/07/21 0027 07/07/21 1411 07/08/21 0422  WBC 10.9* 10.0 8.2 9.3  NEUTROABS 7.9*  --   --   --   HGB 8.7* 8.9* 8.5* 8.9*  HCT 27.5* 27.9* 27.1* 28.2*  MCV 92.9 92.1 94.1 93.7  PLT 174 241 262 265   Blood Culture    Component Value Date/Time   SDES BLOOD RIGHT ANTECUBITAL 06/26/2021 1234    SPECREQUEST  06/26/2021 1234    BOTTLES DRAWN AEROBIC AND ANAEROBIC Blood Culture adequate volume   CULT  06/26/2021 1234    NO GROWTH 5 DAYS Performed at [facility], [place] [address] [place], [place] [address]    REPTSTATUS 07/01/2021 FINAL 06/26/2021 1234    Cardiac Enzymes: No results for input(s): CKTOTAL, CKMB, CKMBINDEX, TROPONINI in the last 168 hours. CBG: Recent Labs  Lab 07/07/21 1613 07/07/21 2039 07/08/21 0004 07/08/21 0409 07/08/21 0801  GLUCAP 117* 145* 133* 176* 124*   Iron Studies: No results for input(s): IRON, TIBC, TRANSFERRIN, FERRITIN in the last 72 hours. Lab Results  Component Value Date   INR 0.9 03/10/2021   INR 1.0 01/26/2021   Studies/Results: DG Abd Portable 1V  Result Date: 07/07/2021 CLINICAL DATA:  Feeding tube placement. EXAM: PORTABLE ABDOMEN - 1 VIEW COMPARISON:  None. FINDINGS: The bowel gas pattern is normal. Distal tip of feeding tube is seen in the expected position of the distal stomach or proximal duodenum. No radio-opaque calculi or other significant radiographic abnormality are seen. IMPRESSION: Distal tip of feeding tube is seen in expected position  of distal stomach or proximal duodenum. Electronically Signed   By: Marijo Conception M.D.   On: 07/07/2021 14:17    Medications:  sodium chloride     dextrose Stopped (07/07/21 1846)   feeding supplement (OSMOLITE 1.2 CAL) 1,000 mL (07/07/21 1624)   heparin 850 Units/hr (07/07/21 1918)   iron sucrose 100 mg (07/05/21 1230)    atorvastatin  40 mg Per Tube Daily   chlorhexidine  15 mL Mouth Rinse BID   Chlorhexidine Gluconate Cloth  6 each Topical Daily   collagenase   Topical Daily   [START ON 07/10/2021] darbepoetin (ARANESP) injection - DIALYSIS  200 mcg Intravenous Q Thu-HD   feeding supplement (PROSource TF)  45 mL Per Tube Daily   gabapentin  100 mg Per Tube Q12H   insulin aspart  0-6 Units Subcutaneous Q4H   lactulose  20 g Per Tube Daily   mouth rinse  15 mL Mouth Rinse  [id number]   metaxalone  400 mg Per Tube TID   midodrine  10 mg Per Tube TID WC   mirtazapine  7.5 mg Per Tube QHS   multivitamin  1 tablet Per Tube QHS   pantoprazole sodium  40 mg Per Tube Daily   sodium chloride flush  10-40 mL Intracatheter Q12H   sodium chloride flush  3 mL Intravenous Q12H   thiamine  100 mg Per Tube Daily   venlafaxine  37.5 mg Per Tube BID    Dialysis Orders: General: Chronically ill appearing elderly female; NAD Heart:RRR, no mrg Lungs:CTAB anterolaterally, nml WOB on RA  Abdomen: Soft and non-tender; active bowel sounds Extremities: 2+ edema in upper extremities/hips/thighs, 1+ in calves/feet Dialysis Access: [facility]   Assessment/Plan: AMS/FTT: Providers and palliative care met w/ family and discussed pt's decline. Due to declining mental and physical status we don't anticipate she will be able to do OP dialysis.  Pt will have to be able to sit in a chair for 4-6 hrs to qualify for OP HD w/ our group. Patient currently tolerating being in chair during HD. Moving forward, she needs to be in the chair for all HD treatments. Of note, patient received HD in bed today d/t restraints applied-will have to receive HD in bed while on restraints for safety purposes. If she continues to tolerate sitting in the chair for dialysis and cooperate, then will re-CLIP the patient as her OP HD unit will not take her back. HD Nurse Coordinator aware and following, stated prior to re-clipping her, we need to ensure patient nutritional status is stable.  Melena/Rectal bleeding - GI consulted. Hx rectal ulcers.  EGD 7/27 normal, flex sig with mucosal ulceration c/w sterocoral ulcers, non bleeding. Hgb 8.9, trending down again. ESRD: usual schedule MWF. Now on TTS while here.  HD nurse reported patient crying during treatment and repeating "ouch" when touched during HD. HD today-tolerated net UF 4L.  Dysphagia - Per primary, if patient continues to tolerate being in the chair for HD, plan for  G-tube placement via surgery. Patient refused G-tube placement yesterday. Her mental status is slowly improving. Please see #1 for requirements to re-clip patient back to OP HD. Plan for psych consult to determine compacity. Access Issues/AVG clotted: Hadf'gram  6/23 which showed chronic thrombus and was started on Eliquis. AVG clotted 9/2, unable to be declotted d/t arm contracture. S/P conversion to tunneled HD catheter 9/7 via IR-on Heparin drip, appreciate their assistance. 6. Hypertension/volume: BP stable, on midodrine 50m TID with significant overload on exam.  Net UF Need to [facility] with HD as tolerated, can give IV albumin prn.  Net UF 4L removed.  7. Nutrition: Albumin 1.7. Getting TFs via NG tube. 8.Recent CVA with no evidence of meaningful recovery.  9. Anemia: Hgb down to 8.9, continue Aranesp-dose recently raised scheduled to receive 9/15..  10. Dispo: DNR/DNI. Poor prognosis. Palliative care has seen see #1. Appreciate their involvement. Although patient is currently tolerating being in the chair for HD, we do not believe dialysis is adding to her quality of life. Per family communication, family wishes to continue. Per primary, family is aware of patient's mental status improving and how she is refusing the Gtube. Awaiting Psych evaluation for capacity. At this point, patient's son wants to think about this and speak with his mother on a final decision. Advised HD staff to continue monitoring patient's behavior during HD. Although behavior is improving, still has occasional episodes of crying during HD. Previously discussed with primary team that family members sit with patient during HD to see the reality of situation and to hopefully console her if she cries during HD. Please see # 4.  Tobie Poet, NP [facility] 07/08/2021,1:19 PM  LOS: 49 days

## 2021-07-08 NOTE — Plan of Care (Signed)
  Problem: Health Behavior/Discharge Planning: Goal: Ability to manage health-related needs will improve Outcome: Progressing   Problem: Clinical Measurements: Goal: Ability to maintain clinical measurements within normal limits will improve Outcome: Progressing   Problem: Activity: Goal: Risk for activity intolerance will decrease Outcome: Progressing   

## 2021-07-08 NOTE — Progress Notes (Signed)
ANTICOAGULATION CONSULT NOTE Pharmacy Consult for Heparin Indication:  h/o CVA and chronic UE thrombus  Allergies  Allergen Reactions   Sensipar [Cinacalcet]     Per MAR   Statins     Per [facility]    Patient Measurements: Height: '5\' 7"'$  (170.2 cm) Weight: 101.2 kg (223 lb) IBW/kg (Calculated) : 61.6 Heparin Dosing Weight:  85 kg  Vital Signs: Temp: 98 F (36.7 C) (09/13 0803) Temp Source: Oral (09/13 0850) BP: 159/86 (09/13 0803) Pulse Rate: 94 (09/13 0803)  Labs: Recent Labs    07/07/21 0027 07/07/21 0356 07/07/21 0421 07/07/21 1411 07/08/21 0422  HGB 8.9*  --   --  8.5* 8.9*  HCT 27.9*  --   --  27.1* 28.2*  PLT 241  --   --  262 265  APTT >200* >200* >200* 84*  --   HEPARINUNFRC 0.87*  --   --   --  0.26*  CREATININE 3.17*  --   --   --   --      Estimated Creatinine Clearance: 19.3 mL/min (A) (by C-G formula based on SCr of 3.17 mg/dL (H)).  Assessment: 75 y.o. female with hx CVA and chronic UE thrombus, Eliquis on hold due to clogged NG tube, pharmacy consulted to manage IV heparin.  Last dose Eliquis 9/10. Heparin levels initially supratherapeutic d/t recent apixaban, now subtherapeutic with heparin level of 0.26. Will increase heparin by 2 units/kg/hr and recheck HL in 8 hours. Will forgo bolus due to recent supratherapeutic levels with previous bolus.  Goal of Therapy:  aPTT 66-102 Heparin level 0.3-0.7 units/ml Monitor platelets by anticoagulation protocol: Yes   Plan:  Increase heparin to 1000 units/hr Check aPTT and HL in 8 hours Daily heparin levels and CBC while on heparin   Thank you for allowing pharmacy to be a part of this patient's care.  Ardyth Harps, PharmD Clinical Pharmacist

## 2021-07-08 NOTE — Procedures (Signed)
Seen and examined on dialysis.  Procedure supervised.  Blood pressure 131/71 on HD with HR 108. Left IJ tunn catheter in use.  Tolerating goal.  We did lower by 0.5 kg to 1 kg as set to 5.5 kg on machine.   She is in the bed for HD but has been in the chair previously.  Not able to be in the chair for HD today as she is in restraints.  She has 2+ upper extremity edema and 1+ lower extremity edema.  Denies overt shortness of breath chest pain, n/v.    Claudia Desanctis, MD 07/08/2021 9:56 AM

## 2021-07-08 NOTE — Care Plan (Signed)
Seen and examined this morning seen and examined this morning in the dialysis unit She was able to tell me her name, year, and  that she is in the hospital and getting dialysis 'Tells me that she usually goes Monday Wednesday Friday. She is aware that she is getting tube feeding vai cortrak has been saying no for  PEG/Tube   Oral plans of care remains the same.  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

## 2021-07-08 NOTE — Progress Notes (Signed)
Patient in bed calm and relaxed while receiving treatment. Removal of R wrist restraint. Placed non-violent soft mitten. Patient is agreeable and understands placement. Will continue to monitor.  Cecille Amsterdam, RN

## 2021-07-08 NOTE — Progress Notes (Addendum)
[facility] PROGRESS NOTE  ELENA MARMOR [id number] DOB: Mar 16, 1946 DOA: 05/19/2021 PCP: [name], MD  Status:  Remains inpatient appropriate because:Unsafe d/c plan, IV treatments appropriate due to intensity of illness or inability to take PO, and Inpatient level of care appropriate due to severity of illness  Dispo: The patient is from: Home              Anticipated d/c is to: SNF              Patient currently is medically stable to d/c.   Difficult to place patient Yes              Barriers to discharge: Dialysis patient with markedly decreased mobility-we will need to prove tolerance of sitting in chair for hemodialysis while here as well as tolerating sitting in wheelchair for up to 1 hour x 2 to replicate transport process.  Cannot discharge and proceed with outpatient hemodialysis unless this is accomplished.    Level of care: Med-Surg  Code Status: DNR Family Communication: Spoke with patient's son [name] again on 9/13.  He is aware that the psychiatry team is determining capacity on his mom given she is much more oriented and now is refusing a feeding tube.  If she has capacity he understands we legally cannot force her to undergo gastrostomy tube placement.  If she does not have capacity he is a little conflicted over proceeding right now and wishes to think about it as well as talk with his mother for making a final decision. DVT prophylaxis: Eliquis currently on hold secondary to need for placement of [place] vaccination status: [name] 02/26/2020, Pfizer 04/10/2021    HPI: 75 y.o. 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  Patient presented  to the ED on 7/25 with dark stool.  On presentation, hemoglobin was 9.6; GI was consulted.  She underwent EGD which was normal and flexible sigmoidoscopy which showed nonbleeding distal rectal ulcers/stercoral ulcers.  Subsequently, GI signed off.  Neurology was also consulted for altered mental status; MRI brain was similar to prior MRI at [facility] with a large right MCA stroke.   Nephrology has been following for dialysis needs; nephrology is stating that patient will not be accepted back to outpatient dialysis at this time due to her underlying confusion, weakness and inability to sit in a dialysis recliner as an outpatient.   Due to her poor oral intake, family wanted a PEG tube placement.  IR tried but was unable to place because of overlying transverse colon.   General surgery was consulted for G-tube placement but family currently undecided.  She has an NG tube in place.   Palliative care following for goals of care discussion.  Subjective: Patient alert in bed during hemodialysis.  She was oriented to name, place and year but then later during our conversation asked me if there was a cat on the baby in her bed.  Objective: Vitals:   07/07/21 2038 07/08/21 0409  BP: 139/70 138/71  Pulse: 93 96  Resp: 16 16  Temp: 98.4 F (36.9 C) 98.4 F (36.9 C)  SpO2: 99% 99%    Intake/Output Summary (Last 24 hours) at 07/08/2021 0752 Last data filed at 07/08/2021 0409 Gross  per 24 hour  Intake 0 ml  Output 0 ml  Net 0 ml   Filed Weights   07/03/21 2040 07/05/21 1016 07/07/21 2100  Weight: 102 kg 102 kg 100.2 kg    Exam:  Constitutional: NAD, calm, alert Respiratory: Lung sounds clear to auscultation, room air, no increased work of breathing Cardiovascular: S1-S2, pulse regular, stable peripheral edema Abdomen: Soft and nondistended with normoactive bowel sounds.[name] LBM 9/08-cortrack for feeding-extremely poor oral intake Neurologic: CN 2-12 grossly intact -Strength 3/5 on the right. 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  Also asked me if there was a cat on the baby in her bed.   Assessment/Plan: Acute problems: Acute on chronic lower GI bleeding from stercoral ulcers -Patient presented with anemia,  underwent EGD which was normal and flex sigmoidoscopy with clean base, nonbleeding distal rectal ulcers (stercoral ulcers). Gastroenterology now signed off  -Hemoglobin stable (7-8).     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 Continue Lipitor (lipid pnl/HgbA1c WNL) Extremely poor to no oral intake-diet changed to regular and son has spoken to family about bringing in outside food as patient states she does not like hospital food and family assisting with feeding-Cortrak tube in place for enteral feedings Patient told surgical team on 9/12 that she did not want a feeding tube.  Core track tube required replacement on 9/12 and she told staff she did not want that either.  Was replaced and patient now requiring restraints. Patient now seems oriented and able to make her own decisions.  Formal psychiatric consultation pending regarding capacity.  ESRD on HD Per Nephro Patient is not a candidate for continued outpatient HD given her confusion, weakness and inability to sit up in a chair for HD outpatient.  But as of 9/12 patient has been tolerating dialysis in chair 9/8 patient tolerating HD in chair well noting she is sleeping. Nephrology suggested transition to hospice care but family resistant.  PMT spoke with son by phone recently and he insisted that as long as his mother could speak that she would remain full aggressive care. Continue Neurontin and Oxy  IR for suspected pain during dialysis TDC placed 9/7 and is functional  Hyperkalemia/hypophosphatemia Management per nephrology HD -supplementation as needed   Hypotension Blood pressure currently is stable on midodrine.   Failure to thrive /Generalized deconditioning  /very poor oral intake PEG tube was desired by family but could not be placed because of overlying transverse colon.   Patient did not eat anything over the 4 days her tube feeding was held therefore tube feeding resumed at previous rate on 9/5 Discussed with general surgery on 8/31 and if patient can tolerate dialysis in chair and plan is to discuss possible open G-tube placed  Constipation Patient was on chronic narcotics prior to admission and these remain in place Patient was on 40 g Chronulac daily prn.  Continue scheduled Chronulac 20 g daily and presentation with rectal ulcers likely secondary to constipation chronic  Type 2 diabetes mellitus Episodes of hypoglycemia Hemoglobin A1c 5.1.  Diet controlled at baseline  Chronic thrombus left upper extremity 9/9 will resume Eliquis post TDC placed 9/7 and is functional Elevate as appropriate  Stage II mid coccyx pressure  injury, present on admission Continue wound care    GERD Could also be contributing to patient's poor oral intake Increased frequency of Protonix to BID sitter adding Pepcid     Data Reviewed: Basic Metabolic Panel: Recent Labs  Lab 07/04/21 0539 07/07/21 0027  NA 132* 131*  K 4.5 4.0  CL 92* 96*  CO2 28 23  GLUCOSE 133* 73  BUN 28* 20  CREATININE 3.23* 3.17*  CALCIUM 8.3* 8.2*  PHOS 3.0  --    Liver Function Tests: Recent Labs  Lab 07/04/21 0539 07/07/21 0027  AST  --  28  ALT  --  6  ALKPHOS  --  90  BILITOT  --  0.4  PROT  --  5.8*  ALBUMIN 1.8* 1.8*   No results for input(s): LIPASE, AMYLASE in the last 168 hours. No results for input(s): AMMONIA in the last 168 hours. CBC: Recent Labs  Lab 07/01/21 0808  07/04/21 0539 07/07/21 0027 07/07/21 1411 07/08/21 0422  WBC 12.9* 10.9* 10.0 8.2 9.3  NEUTROABS  --  7.9*  --   --   --   HGB 9.1* 8.7* 8.9* 8.5* 8.9*  HCT 28.8* 27.5* 27.9* 27.1* 28.2*  MCV 95.4 92.9 92.1 94.1 93.7  PLT 336 174 241 262 265    CBG: Recent Labs  Lab 07/07/21 1140 07/07/21 1613 07/07/21 2039 07/08/21 0004 07/08/21 0409  GLUCAP 102* 117* 145* 133* 176*     Scheduled Meds:  atorvastatin  40 mg Per Tube Daily   chlorhexidine  15 mL Mouth Rinse BID   Chlorhexidine Gluconate Cloth  6 each Topical Daily   collagenase   Topical Daily   [START ON 07/10/2021] darbepoetin (ARANESP) injection - DIALYSIS  200 mcg Intravenous Q Thu-HD   feeding supplement (PROSource TF)  45 mL Per Tube Daily   gabapentin  100 mg Per Tube Q12H   insulin aspart  0-6 Units Subcutaneous Q4H   lactulose  20 g Per Tube Daily   mouth rinse  15 mL Mouth Rinse q12n4p   metaxalone  400 mg Per Tube TID   midodrine  10 mg Per Tube TID WC   mirtazapine  7.5 mg Per Tube QHS   multivitamin  1 tablet Per Tube QHS   pantoprazole sodium  40 mg Per Tube Daily   sodium chloride flush  10-40 mL Intracatheter Q12H   sodium chloride flush  3 mL Intravenous Q12H   thiamine  100 mg Per Tube Daily   venlafaxine  37.5 mg Per Tube BID   Continuous Infusions:  sodium chloride     sodium chloride     sodium chloride     dextrose Stopped (07/07/21 1846)   feeding supplement (OSMOLITE 1.2 CAL) 1,000 mL (07/07/21 1624)   heparin 850 Units/hr (07/07/21 1918)   iron sucrose 100 mg (07/05/21 1230)    Principal Problem:   GI bleed Active Problems:   Essential hypertension   Type 2 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, with long-term current use of insulin (HCC)   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   ESRD on dialysis ([place])   Dyslipidemia   Hypokalemia   Stage 2 skin ulcer of sacral region ([place])   Cerebral thrombosis with cerebral infarction    Protein-calorie malnutrition, severe   Acute GI bleeding   Oropharyngeal dysphagia   Failure to thrive in adult   Inadequate oral intake   Clogged feeding tube   Fever   Consultants: Vascular  surgery Nephrology Gastroenterology Neurology Palliative medicine General surgery  Procedures: EEG Echocardiogram Cortrack EGD  Antibiotics: Ceftriaxone 8/3 through 8/5   Time spent: 25 minutes    Erin Hearing ANP  [facility] 7 am - 330 pm/M-F for direct patient care and secure chat Please refer to Amion for contact info 49  days

## 2021-07-08 NOTE — Progress Notes (Signed)
Nutrition Follow-up  DOCUMENTATION CODES:  Severe malnutrition in context of chronic illness  INTERVENTION:  Continue TF via Cortrak: -Osmolite 1.5 @ 57m/hr (12069md)  -4552mrosource TF daily  Provides 1840 kcals, 86g protein, 914m22mee water  NUTRITION DIAGNOSIS:  Severe Malnutrition related to chronic illness (ESRD on HD, prior CVA) as evidenced by percent weight loss, severe muscle depletion, energy intake < or equal to 75% for > or equal to 1 month. -- ongoing  GOAL:  Patient will meet greater than or equal to 90% of their needs -- met with TF  MONITOR:  PO intake, Supplement acceptance, Diet advancement, Labs, Weight trends, Skin, I & O's  REASON FOR ASSESSMENT:  Consult Enteral/tube feeding initiation and management  ASSESSMENT:  75 y32female with PMH of ESRD on HD MWF, HTN, HLD, T2DM, CVA s/p TPA 02/2021, ?blood clot in UE, left hemiparesis from CVA who presented to ER for dark stool with + fecal occult.  8/17 - cortrak placed (gastric tip) 8/19 - cortrak replaced (gastric tip) 8/29 - cortrak replaced (gastric tip) 9/07 - s/p tunneled HD cath placement 9/12 - cortrak replaced (gastric tip confirmed via xray); diet advanced to regular  Pt now telling staff that she does not want G-tube or Cortrak and is requiring restraints to prevent pulling tube out. Psychiatry consultation pending to determine pt's capacity to make her own decisions.   Current regimen: Osmolite 1.5 @ 50ml56m(1200ml/61m/ 45ml P8murce TF daily   PO intake: 0% x last 8 recorded meals   Medications: Scheduled Meds:  atorvastatin  40 mg Per Tube Daily   chlorhexidine  15 mL Mouth Rinse BID   Chlorhexidine Gluconate Cloth  6 each Topical Daily   collagenase   Topical Daily   [START ON 07/10/2021] darbepoetin (ARANESP) injection - DIALYSIS  200 mcg Intravenous Q Thu-HD   feeding supplement (PROSource TF)  45 mL Per Tube Daily   gabapentin  100 mg Per Tube Q12H   insulin aspart  0-6 Units  Subcutaneous Q4H   lactulose  20 g Per Tube Daily   mouth rinse  15 mL Mouth Rinse q12n4p   metaxalone  400 mg Per Tube TID   midodrine  10 mg Per Tube TID WC   mirtazapine  7.5 mg Per Tube QHS   multivitamin  1 tablet Per Tube QHS   pantoprazole sodium  40 mg Per Tube Daily   sodium chloride flush  10-40 mL Intracatheter Q12H   sodium chloride flush  3 mL Intravenous Q12H   thiamine  100 mg Per Tube Daily   venlafaxine  37.5 mg Per Tube BID  Continuous Infusions:  sodium chloride     dextrose Stopped (07/07/21 1846)   feeding supplement (OSMOLITE 1.5 CAL)     heparin 1,000 Units/hr (07/08/21 1401)   iron sucrose 100 mg (07/08/21 1320)   Labs: Recent Labs  Lab 07/04/21 0539 07/07/21 0027  NA 132* 131*  K 4.5 4.0  CL 92* 96*  CO2 28 23  BUN 28* 20  CREATININE 3.23* 3.17*  CALCIUM 8.3* 8.2*  PHOS 3.0  --   GLUCOSE 133* 73  CBGs 69-176 x24 hours  Last HD 9/13, 4L net UF No UOP documented x24 hours I/O: +2.2L since admit  RN edema assessment notes mild pitting edema to BUE and non-pitting edema to BLE.   Diet Order:   Diet Order             Diet regular Room service appropriate? Yes;  Fluid consistency: Thin  Diet effective now                  EDUCATION NEEDS:  Education needs have been addressed  Skin:  Skin Assessment: Skin Integrity Issues: Skin Integrity Issues:: Stage II Stage II: Pressure Injury on coccyx; Open wound on buttocks  Last BM:  9/11  Height:  Ht Readings from Last 1 Encounters:  05/20/21 _0  (1.702 m)   Weight:  Wt Readings from Last 1 Encounters:  07/08/21 102.1 kg   BMI:  Body mass index is 35.24 kg/m.  Estimated Nutritional Needs:  Kcal:  1700-1900 Protein:  85-100 grams Fluid:  1L +UOP    Larkin Ina, MS, RD, LDN (she/her/hers) RD pager number and weekend/on-call pager number located in [place].

## 2021-07-08 NOTE — Consult Note (Signed)
Reason for Consult: Evaluate for Capacity Referring Physician: Samella Parr, NP  Amy [name] is an 75 y.o. female.  HPI:  75 y.o. 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  Patient presented to the ED on 7/25 with dark stool.  On presentation, hemoglobin was 9.6; GI was consulted.  She underwent EGD which was normal and flexible sigmoidoscopy which showed nonbleeding distal rectal ulcers/stercoral ulcers.  Subsequently, GI signed off.  Neurology was also consulted for altered mental status; MRI brain was similar to prior MRI at [facility] with a large right MCA stroke.     On interview today patient is still adamant that she does not want PEG tube placement.  Had an in-depth discussion with her about her reasoning for refusal which is documented below.  She is oriented to person place and year however not to month (stated it was February).  She was able to tell me that she was hospitalized due to a stroke.  She reports that she has been refusing to eat because she thinks the food here is disgusting.  She states that though she thinks the food is not good she will start eating and that she has ordered a hotdog for dinner.  During our conversation she did say that she was looking at a bug crawling on a baby (there was no bug or baby present in the room).  She reported that she gets dialysis every Mon/Wed/Fri.  She did say that she was upset with her son her making medical decisions for her (Peg tube placement) and that he should know better and should have just come and talk with her.  She states she plans to talk with her sister and brother.  As I was ending the interview she once again stated  that she does not want PEG tube placement.  She reports no other concerns at this time.  Past Medical History:  Diagnosis Date   Chronic kidney disease    dialysis M-W-F - dialysis cath located in right side of chest   Diabetes mellitus without complication ([place])    type 2 - no meds    HLD (hyperlipidemia)    diet controlled   Hypertension    no meds    Past Surgical History:  Procedure Laterality Date   A/V FISTULAGRAM Left 01/10/2021   Procedure: A/V FISTULAGRAM;  Surgeon: Angelia Mould, MD;  Location: [facility];  Service: Cardiovascular;  Laterality: Left;   A/V FISTULAGRAM N/A 01/27/2021   Procedure: A/V FISTULAGRAM;  Surgeon: Waynetta Sandy, MD;  Location: [facility];  Service: Cardiovascular;  Laterality: N/A;   AV FISTULA PLACEMENT Left 09/24/2020   Procedure: LEFT UPPER ARM ARTERIOVENOUS GORTEX  GRAFT;  Surgeon: Elam Dutch, MD;  Location: [facility];  Service: Vascular;  Laterality: Left;   BREAST CYST EXCISION Right 2011   cyst removed -neg   ESOPHAGOGASTRODUODENOSCOPY (EGD) WITH PROPOFOL N/A 05/21/2021   Procedure: ESOPHAGOGASTRODUODENOSCOPY (EGD) WITH PROPOFOL;  Surgeon: Jackquline Denmark, MD;  Location: [facility] ENDOSCOPY;  Service: Endoscopy;  Laterality: N/A;   EYE SURGERY Bilateral    cataracts   FLEXIBLE SIGMOIDOSCOPY N/A 05/21/2021   Procedure: FLEXIBLE SIGMOIDOSCOPY;  Surgeon: Jackquline Denmark, MD;  Location: [facility] ENDOSCOPY;  Service: Endoscopy;  Laterality: N/A;   INSERTION OF DIALYSIS CATHETER Right 07/2019   IR FLUORO GUIDE CV LINE LEFT  06/27/2021   IR FLUORO GUIDE CV LINE LEFT  07/02/2021   IR GASTROSTOMY TUBE MOD SED  05/29/2021   IR US GUIDE VASC ACCESS LEFT  06/27/2021   PERIPHERAL VASCULAR BALLOON ANGIOPLASTY  01/10/2021   Procedure: PERIPHERAL VASCULAR BALLOON ANGIOPLASTY;  Surgeon: Angelia Mould, MD;  Location: [facility];  Service: Cardiovascular;;  left AV graft   TUBAL LIGATION     Ultrasound-guided cannulation left  upper arm AV graft  01/27/2021    Family History  Problem Relation Age of Onset   Breast cancer Neg Hx     Social History:  reports that she has quit smoking. She has never used smokeless tobacco. She reports current alcohol use. She reports that she does not currently use drugs after having used the following drugs: Marijuana.  Allergies:  Allergies  Allergen Reactions   Sensipar [Cinacalcet]     Per MAR   Statins     Per MAR    Medications: I have reviewed the patient's current medications.  Results for orders placed or performed during the hospital encounter of 05/19/21 (from the past 48 hour(s))  Glucose, capillary     Status: Abnormal   Collection Time: 07/06/21  7:52 PM  Result Value Ref Range   Glucose-Capillary 65 (L) 70 - 99 mg/dL    Comment: Glucose reference range applies only to samples taken after fasting for at least 8 hours.  Glucose, capillary     Status: Abnormal   Collection Time: 07/06/21  9:06 PM  Result Value Ref Range   Glucose-Capillary 112 (H) 70 - 99 mg/dL    Comment: Glucose reference range applies only to samples taken after fasting for at least 8 hours.  Glucose, capillary     Status: None   Collection Time: 07/07/21 12:10 AM  Result Value Ref Range   Glucose-Capillary 81 70 - 99 mg/dL    Comment: Glucose reference range applies only to samples taken after fasting for at least 8 hours.  Heparin level (unfractionated)     Status: Abnormal   Collection Time: 07/07/21 12:27 AM  Result Value Ref Range   Heparin Unfractionated 0.87 (H) 0.30 - 0.70 IU/mL    Comment: (NOTE) The clinical reportable range upper limit is being lowered to >1.10 to align with the FDA approved guidance for the current laboratory assay.  If heparin results are below expected values, and patient dosage has  been confirmed, suggest follow up testing of antithrombin III levels. Performed at [facility], [place] [address] [place], [place] [address]   APTT     Status:  Abnormal   Collection Time: 07/07/21 12:27 AM  Result Value Ref Range   aPTT >200 (HH) 24 - 36 seconds    Comment:        IF BASELINE aPTT IS ELEVATED, SUGGEST PATIENT RISK ASSESSMENT BE USED TO DETERMINE APPROPRIATE ANTICOAGULANT THERAPY. REPEATED TO VERIFY CRITICAL RESULT CALLED TO, READ BACK BY AND VERIFIED WITH: Izora Gala RN @ [phone] 07/07/21 A JOHNSON  SPECIMEN CHECK OK 07/07/21  Performed at [facility], [place] [address] [place] [address]   CBC     Status: Abnormal   Collection Time: 07/07/21 12:27 AM  Result Value Ref Range   WBC 10.0 4.0 - 10.5 K/uL   RBC 3.03 (L) 3.87 - 5.11 MIL/uL   Hemoglobin 8.9 (L) 12.0 -  15.0 g/dL   HCT 27.9 (L) 36.0 - 46.0 %   MCV 92.1 80.0 - 100.0 fL   MCH 29.4 26.0 - 34.0 pg   MCHC 31.9 30.0 - 36.0 g/dL   RDW 17.4 (H) 11.5 - 15.5 %   Platelets 241 150 - 400 K/uL   nRBC 0.4 (H) 0.0 - 0.2 %    Comment: Performed at [facility] [address] [place], [place] [address]  Comprehensive metabolic panel     Status: Abnormal   Collection Time: 07/07/21 12:27 AM  Result Value Ref Range   Sodium 131 (L) 135 - 145 mmol/L   Potassium 4.0 3.5 - 5.1 mmol/L   Chloride 96 (L) 98 - 111 mmol/L   CO2 23 22 - 32 mmol/L   Glucose, Bld 73 70 - 99 mg/dL    Comment: Glucose reference range applies only to samples taken after fasting for at least 8 hours.   BUN 20 8 - 23 mg/dL   Creatinine, Ser 3.17 (H) 0.44 - 1.00 mg/dL   Calcium 8.2 (L) 8.9 - 10.3 mg/dL   Total Protein 5.8 (L) 6.5 - 8.1 g/dL   Albumin 1.8 (L) 3.5 - 5.0 g/dL   AST 28 15 - 41 U/L   ALT 6 0 - 44 U/L   Alkaline Phosphatase 90 38 - 126 U/L   Total Bilirubin 0.4 0.3 - 1.2 mg/dL   GFR, Estimated 15 (L) >60 mL/min    Comment: (NOTE) Calculated using the CKD-EPI Creatinine Equation (2021)    Anion gap 12 5 - 15    Comment: Performed at [facility], [place] [address] [place], [place] [address]  APTT     Status: Abnormal   Collection Time: 07/07/21  3:56 AM   Result Value Ref Range   aPTT >200 (HH) 24 - 36 seconds    Comment:        IF BASELINE aPTT IS ELEVATED, SUGGEST PATIENT RISK ASSESSMENT BE USED TO DETERMINE APPROPRIATE ANTICOAGULANT THERAPY. REPEATED TO VERIFY CRITICAL RESULT CALLED TO, READ BACK BY AND VERIFIED WITH: NOTIFIED J. [facility] RN @ [phone] 07/07/21 A JOHNSON Performed at [facility], [place] [address] [place], [place] [address]   Glucose, capillary     Status: None   Collection Time: 07/07/21  4:15 AM  Result Value Ref Range   Glucose-Capillary 73 70 - 99 mg/dL    Comment: Glucose reference range applies only to samples taken after fasting for at least 8 hours.  APTT     Status: Abnormal   Collection Time: 07/07/21  4:21 AM  Result Value Ref Range   aPTT >200 (HH) 24 - 36 seconds    Comment:        IF BASELINE aPTT IS ELEVATED, SUGGEST PATIENT RISK ASSESSMENT BE USED TO DETERMINE APPROPRIATE ANTICOAGULANT THERAPY. REPEATED TO VERIFY CRITICAL RESULT CALLED TO, READ BACK BY AND VERIFIED WITH: NOTIFIED J. LAMBERANG '@[phone]'$  07/07/21 A. JOHNSON  EXTRA TUBE THAT WAS DRAWN FOR THIS TEST Performed at [facility], [place] [address], [place], [place] [address]   Glucose, capillary     Status: Abnormal   Collection Time: 07/07/21  7:52 AM  Result Value Ref Range   Glucose-Capillary 69 (L) 70 - 99 mg/dL    Comment: Glucose reference range applies only to samples taken after fasting for at least 8 hours.  Glucose, capillary     Status: Abnormal   Collection Time: 07/07/21  8:32 AM  Result  Value Ref Range   Glucose-Capillary 118 (H) 70 - 99 mg/dL    Comment: Glucose reference range applies only to samples taken after fasting for at least 8 hours.  Glucose, capillary     Status: Abnormal   Collection Time: 07/07/21 11:40 AM  Result Value Ref Range   Glucose-Capillary 102 (H) 70 - 99 mg/dL    Comment: Glucose reference range applies only to samples taken after fasting for at least 8 hours.  APTT     Status:  Abnormal   Collection Time: 07/07/21  2:11 PM  Result Value Ref Range   aPTT 84 (H) 24 - 36 seconds    Comment:        IF BASELINE aPTT IS ELEVATED, SUGGEST PATIENT RISK ASSESSMENT BE USED TO DETERMINE APPROPRIATE ANTICOAGULANT THERAPY. Performed at [facility], [place] [address], [place], [place] [address]   CBC     Status: Abnormal   Collection Time: 07/07/21  2:11 PM  Result Value Ref Range   WBC 8.2 4.0 - 10.5 K/uL   RBC 2.88 (L) 3.87 - 5.11 MIL/uL   Hemoglobin 8.5 (L) 12.0 - 15.0 g/dL   HCT 27.1 (L) 36.0 - 46.0 %   MCV 94.1 80.0 - 100.0 fL   MCH 29.5 26.0 - 34.0 pg   MCHC 31.4 30.0 - 36.0 g/dL   RDW 17.8 (H) 11.5 - 15.5 %   Platelets 262 150 - 400 K/uL   nRBC 0.0 0.0 - 0.2 %    Comment: Performed at [facility] [address] [place], [place] [address]  Hepatitis B surface antigen     Status: None   Collection Time: 07/07/21  2:11 PM  Result Value Ref Range   Hepatitis B Surface Ag NON REACTIVE NON REACTIVE    Comment: Performed at [facility] [address], [place], [place] [address]  Hepatitis B surface antibody     Status: Abnormal   Collection Time: 07/07/21  2:11 PM  Result Value Ref Range   Hep B S Ab Reactive (A) NON REACTIVE    Comment: (NOTE) Consistent with immunity, greater than 9.9 mIU/mL.  Performed at [facility], [place] [address] [place], [place] [address]   Hepatitis B surface antibody,quantitative     Status: None   Collection Time: 07/07/21  2:11 PM  Result Value Ref Range   Hepatitis B-Post >1,000.0 Immunity>9.9 mIU/mL    Comment: (NOTE)  Status of Immunity                     Anti-HBs Level  ------------------                     -------------- Inconsistent with Immunity                   0.0 - 9.9 Consistent with Immunity                          >9.9 Performed At: [facility] [place], [place] [id number] Rush Farmer MD [id number]   Glucose, capillary     Status: Abnormal    Collection Time: 07/07/21  4:13 PM  Result Value Ref Range   Glucose-Capillary 117 (H) 70 - 99 mg/dL    Comment: Glucose reference range applies only to samples taken after fasting for at least 8 hours.  Glucose, capillary     Status: Abnormal  Collection Time: 07/07/21  8:39 PM  Result Value Ref Range   Glucose-Capillary 145 (H) 70 - 99 mg/dL    Comment: Glucose reference range applies only to samples taken after fasting for at least 8 hours.  Glucose, capillary     Status: Abnormal   Collection Time: 07/08/21 12:04 AM  Result Value Ref Range   Glucose-Capillary 133 (H) 70 - 99 mg/dL    Comment: Glucose reference range applies only to samples taken after fasting for at least 8 hours.  Glucose, capillary     Status: Abnormal   Collection Time: 07/08/21  4:09 AM  Result Value Ref Range   Glucose-Capillary 176 (H) 70 - 99 mg/dL    Comment: Glucose reference range applies only to samples taken after fasting for at least 8 hours.  Heparin level (unfractionated)     Status: Abnormal   Collection Time: 07/08/21  4:22 AM  Result Value Ref Range   Heparin Unfractionated 0.26 (L) 0.30 - 0.70 IU/mL    Comment: (NOTE) The clinical reportable range upper limit is being lowered to >1.10 to align with the FDA approved guidance for the current laboratory assay.  If heparin results are below expected values, and patient dosage has  been confirmed, suggest follow up testing of antithrombin III levels. Performed at [facility], [place] [address] [place], [place] [address]   CBC     Status: Abnormal   Collection Time: 07/08/21  4:22 AM  Result Value Ref Range   WBC 9.3 4.0 - 10.5 K/uL   RBC 3.01 (L) 3.87 - 5.11 MIL/uL   Hemoglobin 8.9 (L) 12.0 - 15.0 g/dL   HCT 28.2 (L) 36.0 - 46.0 %   MCV 93.7 80.0 - 100.0 fL   MCH 29.6 26.0 - 34.0 pg   MCHC 31.6 30.0 - 36.0 g/dL   RDW 17.7 (H) 11.5 - 15.5 %   Platelets 265 150 - 400 K/uL   nRBC 0.3 (H) 0.0 - 0.2 %    Comment: Performed at [facility], [place] [address] [place], [place] [address]  Hepatitis B core antibody, total     Status: Abnormal   Collection Time: 07/08/21  6:25 AM  Result Value Ref Range   Hep B Core Total Ab Reactive (A) NON REACTIVE    Comment: Performed at [facility] [address], [place], [place] [address]  Glucose, capillary     Status: Abnormal   Collection Time: 07/08/21  8:01 AM  Result Value Ref Range   Glucose-Capillary 124 (H) 70 - 99 mg/dL    Comment: Glucose reference range applies only to samples taken after fasting for at least 8 hours.  Glucose, capillary     Status: Abnormal   Collection Time: 07/08/21  1:26 PM  Result Value Ref Range   Glucose-Capillary 163 (H) 70 - 99 mg/dL    Comment: Glucose reference range applies only to samples taken after fasting for at least 8 hours.  Glucose, capillary     Status: Abnormal   Collection Time: 07/08/21  4:31 PM  Result Value Ref Range   Glucose-Capillary 174 (H) 70 - 99 mg/dL    Comment: Glucose reference range applies only to samples taken after fasting for at least 8 hours.    DG Abd Portable 1V  Result Date: 07/07/2021 CLINICAL DATA:  Feeding tube placement. EXAM: PORTABLE ABDOMEN - 1 VIEW COMPARISON:  None. FINDINGS: The bowel gas pattern is normal. Distal tip of feeding  tube is seen in the expected position of the distal stomach or proximal duodenum. No radio-opaque calculi or other significant radiographic abnormality are seen. IMPRESSION: Distal tip of feeding tube is seen in expected position of distal stomach or proximal duodenum. Electronically Signed   By: Marijo Conception M.D.   On: 07/07/2021 14:17    Review of Systems Blood pressure 119/63, pulse (!) 101, temperature 98.2 F (36.8 C), resp. rate 17, height '5\' 7"'$  (1.702 m), weight 102.1 kg, SpO2 99 %. Physical Exam  Assessment/Plan:  Mental Capacity Assessment: I have evaluated the following areas to assess the Amy [name]'s mental capacity regarding medical  decision-making ability which pertains to competency to accept or refuse medical treatment.   The specific treatment or service in question is: Peg Tube placement  Communication: The patient was able to clearly state preferred treatment options for her medical care at this time. The patient was able to decide that she does-not want a Peg Tube placed.   Understanding: The patient was able process general probabilities regarding life situations and medical treatment scenarios. She was able to paraphrase her view of the current situation and her thoughts about it including future-oriented scenarios.  She was able to state that if she did not have this surgery and did not start eating that she would die.  The patient did not present with impairments in memory, attention span, or intelligence.   Appreciation: The patient was able to identify and describe their various illnesses and treatment options with potential outcomes.The patient did-not present with concerns such as denial or delusional thought-process.   Rationalization: The patient was able to weigh risks and benefits and come to a conclusion congruent with patient's perceived goals. Concerns regarding this category are: The inability to properly care for the PEG tube if she were to have it placed.  She understands that if she could not properly care for it it could lead to further hospitalizations/bills and potentially lead to her death. In conclusion, the patient is-not experiencing an acute medical scenario.   Conclusion: At this time, we can determine that the patient does have functional mental capacity for medical decision-making including the right to accept or refuse specific medical treatment.  Amy [name] 07/08/2021, 4:32 PM

## 2021-07-09 DIAGNOSIS — K922 Gastrointestinal hemorrhage, unspecified: Secondary | ICD-10-CM | POA: Diagnosis not present

## 2021-07-09 LAB — CBC
HCT: 27.9 % — ABNORMAL LOW (ref 36.0–46.0)
Hemoglobin: 8.6 g/dL — ABNORMAL LOW (ref 12.0–15.0)
MCH: 29.4 pg (ref 26.0–34.0)
MCHC: 30.8 g/dL (ref 30.0–36.0)
MCV: 95.2 fL (ref 80.0–100.0)
Platelets: 186 10*3/uL (ref 150–400)
RBC: 2.93 MIL/uL — ABNORMAL LOW (ref 3.87–5.11)
RDW: 18.2 % — ABNORMAL HIGH (ref 11.5–15.5)
WBC: 9.5 10*3/uL (ref 4.0–10.5)
nRBC: 0 % (ref 0.0–0.2)

## 2021-07-09 LAB — RENAL FUNCTION PANEL
Albumin: 2.2 g/dL — ABNORMAL LOW (ref 3.5–5.0)
Anion gap: 9 (ref 5–15)
BUN: 16 mg/dL (ref 8–23)
CO2: 27 mmol/L (ref 22–32)
Calcium: 8.3 mg/dL — ABNORMAL LOW (ref 8.9–10.3)
Chloride: 95 mmol/L — ABNORMAL LOW (ref 98–111)
Creatinine, Ser: 2.61 mg/dL — ABNORMAL HIGH (ref 0.44–1.00)
GFR, Estimated: 19 mL/min — ABNORMAL LOW (ref >60)
Glucose, Bld: 204 mg/dL — ABNORMAL HIGH (ref 70–99)
Phosphorus: 2.4 mg/dL — ABNORMAL LOW (ref 2.5–4.6)
Potassium: 4.3 mmol/L (ref 3.5–5.1)
Sodium: 131 mmol/L — ABNORMAL LOW (ref 135–145)

## 2021-07-09 LAB — GLUCOSE, CAPILLARY
Glucose-Capillary: 223 mg/dL — ABNORMAL HIGH (ref 70–99)
Glucose-Capillary: 160 mg/dL — ABNORMAL HIGH (ref 70–99)
Glucose-Capillary: 184 mg/dL — ABNORMAL HIGH (ref 70–99)
Glucose-Capillary: 158 mg/dL — ABNORMAL HIGH (ref 70–99)
Glucose-Capillary: 166 mg/dL — ABNORMAL HIGH (ref 70–99)

## 2021-07-09 LAB — APTT: aPTT: >200 seconds (ref 24–36)

## 2021-07-09 LAB — HEPARIN LEVEL (UNFRACTIONATED): Heparin Unfractionated: 0.42 IU/mL (ref 0.30–0.70)

## 2021-07-09 MED ORDER — ALTEPLASE 2 MG IJ SOLR: 2.0000 mg | Freq: Once | INTRAMUSCULAR | Status: DC | PRN

## 2021-07-09 MED ORDER — PENTAFLUOROPROP-TETRAFLUOROETH EX AERO: 1.0000 "application " | INHALATION_SPRAY | CUTANEOUS | Status: DC | PRN

## 2021-07-09 MED ORDER — SODIUM CHLORIDE 0.9 % IV SOLN: 100.0000 mL | INTRAVENOUS | Status: DC | PRN

## 2021-07-09 MED ORDER — LIDOCAINE HCL (PF) 1 % IJ SOLN: 5.0000 mL | INTRAMUSCULAR | Status: DC | PRN

## 2021-07-09 MED ORDER — HEPARIN SODIUM (PORCINE) 1000 UNIT/ML DIALYSIS
1000.0000 [IU] | INTRAMUSCULAR | Status: DC | PRN
  Administered 2021-07-10: 1000 [IU] via INTRAVENOUS_CENTRAL
  Filled 2021-07-09: qty 1

## 2021-07-09 MED ORDER — LIDOCAINE-PRILOCAINE 2.5-2.5 % EX CREA: 1.0000 "application " | TOPICAL_CREAM | CUTANEOUS | Status: DC | PRN

## 2021-07-09 MED ORDER — APIXABAN 2.5 MG PO TABS
2.5000 mg | ORAL_TABLET | Freq: Two times a day (BID) | ORAL | Status: DC
  Administered 2021-07-09 – 2021-07-18 (×19): 2.5 mg
  Filled 2021-07-09 (×18): qty 1

## 2021-07-09 MED ORDER — APIXABAN 2.5 MG PO TABS
2.5000 mg | ORAL_TABLET | Freq: Two times a day (BID) | ORAL | Status: DC
  Administered 2021-07-09: 2.5 mg via ORAL
  Filled 2021-07-09: qty 1

## 2021-07-09 NOTE — Progress Notes (Addendum)
[facility] Progress Note   Subjective:    Seen and examined patient at bedside. Patient appears comfortable. Oriented to herself, place, and year. Denies SOB, pain, CP, ABD pain, and n/v. Plan for HD 9/15.  Objective Vitals:   07/08/21 1327 07/08/21 2117 07/09/21 0532 07/09/21 1037  BP: 119/63 (!) 145/68 (!) 149/73 (!) 142/66  Pulse: (!) 101 95 (!) 103 99  Resp: _0 Temp: 98.2 F (36.8 C) 98.4 F (36.9 C) 98.1 F (36.7 C) 98.1 F (36.7 C)  TempSrc:  Oral Oral Axillary  SpO2: 99% 100% 100% 100%  Weight:      Height:       Physical Exam General: Chronically ill appearing elderly female; NAD Heart:RRR, no mrg Lungs:CTAB anterolaterally, nml WOB on RA  Abdomen: Soft and non-tender; active bowel sounds Extremities: BLUEs edema improving 1-2+; 1+ bilateral hips and LE. Dialysis Access: [facility]   Filed Weights   07/08/21 0815 07/08/21 0850 07/08/21 1250  Weight: 101.2 kg 102.1 kg 98 kg    Intake/Output Summary (Last 24 hours) at 07/09/2021 1235 Last data filed at 07/09/2021 0900 Gross per 24 hour  Intake 2182.95 ml  Output 1 ml  Net 2181.95 ml    Additional Objective Labs: Basic Metabolic Panel: Recent Labs  Lab 07/04/21 0539 07/07/21 0027 07/09/21 0351  NA 132* 131* 131*  K 4.5 4.0 4.3  CL 92* 96* 95*  CO2 _1 GLUCOSE 133* 73 204*  BUN 28* 20 16  CREATININE 3.23* 3.17* 2.61*  CALCIUM 8.3* 8.2* 8.3*  PHOS 3.0  --  2.4*   Liver Function Tests: Recent Labs  Lab 07/04/21 0539 07/07/21 0027 07/09/21 0351  AST  --  28  --   ALT  --  6  --   ALKPHOS  --  90  --   BILITOT  --  0.4  --   PROT  --  5.8*  --   ALBUMIN 1.8* 1.8* 2.2*   No results for input(s): LIPASE, AMYLASE in the last 168 hours. CBC: Recent Labs  Lab 07/04/21 0539 07/07/21 0027 07/07/21 1411 07/08/21 0422 07/09/21 0351  WBC 10.9* 10.0 8.2 9.3 9.5  NEUTROABS 7.9*  --   --   --   --   HGB 8.7* 8.9* 8.5* 8.9* 8.6*  HCT 27.5* 27.9* 27.1* 28.2* 27.9*  MCV  92.9 92.1 94.1 93.7 95.2  PLT 174 241 262 265 186   Blood Culture    Component Value Date/Time   SDES BLOOD RIGHT ANTECUBITAL 06/26/2021 1234   SPECREQUEST  06/26/2021 1234    BOTTLES DRAWN AEROBIC AND ANAEROBIC Blood Culture adequate volume   CULT  06/26/2021 1234    NO GROWTH 5 DAYS Performed at [facility], [place] [address], [place], [place] [address]    REPTSTATUS 07/01/2021 FINAL 06/26/2021 1234    Cardiac Enzymes: No results for input(s): CKTOTAL, CKMB, CKMBINDEX, TROPONINI in the last 168 hours. CBG: Recent Labs  Lab 07/08/21 1945 07/08/21 2354 07/09/21 0413 07/09/21 0733 07/09/21 1137  GLUCAP 166* 188* 223* 160* 184*   Iron Studies: No results for input(s): IRON, TIBC, TRANSFERRIN, FERRITIN in the last 72 hours. Lab Results  Component Value Date   INR 0.9 03/10/2021   INR 1.0 01/26/2021   Studies/Results: DG Abd Portable 1V  Result Date: 07/07/2021 CLINICAL DATA:  Feeding tube placement. EXAM: PORTABLE ABDOMEN - 1 VIEW COMPARISON:  None. FINDINGS: The bowel gas pattern is normal. Distal tip of feeding  tube is seen in the expected position of the distal stomach or proximal duodenum. No radio-opaque calculi or other significant radiographic abnormality are seen. IMPRESSION: Distal tip of feeding tube is seen in expected position of distal stomach or proximal duodenum. Electronically Signed   By: Marijo Conception M.D.   On: 07/07/2021 14:17    Medications:  sodium chloride     dextrose 50 mL/hr at 07/09/21 1002   feeding supplement (OSMOLITE 1.5 CAL) 1,000 mL (07/09/21 0954)   iron sucrose 100 mg (07/08/21 1320)    apixaban  2.5 mg Per Tube BID   atorvastatin  40 mg Per Tube Daily   chlorhexidine  15 mL Mouth Rinse BID   Chlorhexidine Gluconate Cloth  6 each Topical Daily   [START ON 07/10/2021] darbepoetin (ARANESP) injection - DIALYSIS  200 mcg Intravenous Q Thu-HD   feeding supplement (PROSource TF)  45 mL Per Tube Daily   gabapentin  100 mg Per Tube Q12H    insulin aspart  0-6 Units Subcutaneous Q4H   lactulose  20 g Per Tube Daily   mouth rinse  15 mL Mouth Rinse q12n4p   metaxalone  400 mg Per Tube TID   midodrine  10 mg Per Tube TID WC   mirtazapine  7.5 mg Per Tube QHS   multivitamin  1 tablet Per Tube QHS   pantoprazole sodium  40 mg Per Tube Daily   sodium chloride flush  10-40 mL Intracatheter Q12H   sodium chloride flush  3 mL Intravenous Q12H   thiamine  100 mg Per Tube Daily   venlafaxine  37.5 mg Per Tube BID    Dialysis Orders: AF MWF  3h 37mn 400/500    62.5kg    3K/2.5 Ca    P2  AVG HeRO   Hep none - Mircera 50 q 2 wks  (7/13)  Venofer 50 q wk - Parsabiv 534mIV q HD  Assessment/Plan: AMS/FTT: Previously, providers and palliative care met w/ family and discussed pt's decline. Due to declining mental and physical status we don't anticipate she will be able to do OP dialysis.  Pt will have to be able to sit in a chair for 4-6 hrs to qualify for OP HD w/ our group. Mental status is improving and she is tolerating being in chair during HD. Yesterday, HD done  in the bed d/t restraints. Patient needs to not require restraints for outpatient hemodialysis. Moving forward, she needs to be in the chair for all HD treatments and assess restraint requirements daily. If she continues to tolerate sitting in the chair for dialysis and cooperate, then will re-CLIP the patient as her OP HD unit will not take her back. HD Nurse Coordinator aware and following, stated prior to re-clipping her, we need to ensure patient nutritional status is stable.  Melena/Rectal bleeding - GI consulted. Hx rectal ulcers.  EGD 7/27 normal, flex sig with mucosal ulceration c/w sterocoral ulcers, non bleeding. Hgb 8.6. ESRD: usual schedule MWF. Now on TTS while here.  HD nurse reported patient crying during treatment and repeating "ouch" when touched during HD over weekend. Tolerated net UF 4L yesterday. Plan for HD 9/15. Dysphagia - Per primary, if patient  continues to tolerate being in the chair for HD, plan for G-tube placement via surgery. Patient seen by Psych yesterday-reviewed note: It is determined that patient has functional mental capacity for medical decision-making. Patient still refusing G-tube placement. Access Issues/AVG clotted: Hadf'gram  6/23 which showed chronic thrombus and  was started on Eliquis. AVG clotted 9/2, unable to be declotted d/t arm contracture. S/P conversion to tunneled HD catheter 9/7 via IR-on Heparin drip, appreciate their assistance. 6. Hypertension/volume: BP stable, on midodrine 66m TID with significant overload on exam. Net UF Need to ^ UFG with HD as tolerated, can give IV albumin prn.  Net UF 4L removed.  7. Nutrition: Albumin 1.7. Getting TFs via NG tube. 8.Recent CVA with no evidence of meaningful recovery.  9. Anemia: Hgb down to 8.6, continue Aranesp-dose recently raised scheduled to receive 9/15..  10. Dispo: DNR/DNI. Poor prognosis. Palliative care has seen see #1. Appreciate their involvement. Although patient is currently tolerating being in the chair for HD, we do not believe dialysis is adding to her quality of life. Per family communication, family wishes to continue. Please see #4 for update. Advised HD staff to continue monitoring patient's behavior during HD. Although behavior is improving, still has occasional episodes of crying during HD. Previously discussed with primary team that family members sit with patient during HD to see the reality of situation and to hopefully console her if she cries during HD.   CTobie Poet NP [facility] 07/09/2021,12:35 PM  LOS: 50 days     Seen and examined independently.  Agree with note and exam as documented above by physician extender and as noted here.    General elderly female in bed in no acute distress HEENT normocephalic atraumatic Neck supple trachea midline Lungs clear to auscultation bilaterally normal work of breathing at  rest  Heart S1S2 no rub Abdomen soft nontender nondistended Extremities no lower extremity edema  Psych no anxiety or agitation  Neuro - year is 2022 per pt; oriented to person and location is "[place] when asked [place] Left [place] tunn catheter  ESRD for HD per TTS schedule for now; palliative care involved and poor prognosis.  Must do HD in chair to be able to get outpatient HD and would not be able to be in restraints - would please stop dextrose when able per primary team - would avoid skelaxin in ESRD patient   Dysphagia - per primary.  See pt refusing G tube    Anemia and CKD - on ESA   Chronic hypotension - on midodrine   Appreciate primary team and palliative   LClaudia Desanctis MD 07/09/2021  2:42 PM

## 2021-07-09 NOTE — Progress Notes (Signed)
ANTICOAGULATION CONSULT NOTE Pharmacy Consult for Heparin Indication:  h/o CVA and chronic UE thrombus  Allergies  Allergen Reactions   Sensipar [Cinacalcet]     Per MAR   Statins     Per [facility]    Patient Measurements: Height: '5\' 7"'$  (170.2 cm) Weight: 98 kg (216 lb 0.8 oz) IBW/kg (Calculated) : 61.6 Heparin Dosing Weight:  85 kg  Vital Signs: Temp: 98.1 F (36.7 C) (09/14 1037) Temp Source: Axillary (09/14 1037) BP: 142/66 (09/14 1037) Pulse Rate: 99 (09/14 1037)  Labs: Recent Labs    07/07/21 0027 07/07/21 0356 07/07/21 0421 07/07/21 1411 07/08/21 0422 07/08/21 2213 07/09/21 0351  HGB 8.9*  --   --  8.5* 8.9*  --  8.6*  HCT 27.9*  --   --  27.1* 28.2*  --  27.9*  PLT 241  --   --  262 265  --  186  APTT >200*   < > >200* 84*  --  >200*  --   HEPARINUNFRC 0.87*  --   --   --  0.26* 0.60 0.42  CREATININE 3.17*  --   --   --   --   --  2.61*   < > = values in this interval not displayed.     Estimated Creatinine Clearance: 23.1 mL/min (A) (by C-G formula based on SCr of 2.61 mg/dL (H)).  Assessment: 75 y.o. female with hx CVA and chronic UE thrombus, Eliquis on hold due to clogged NG tube, pharmacy consulted to manage IV heparin.   Now NG tube function has been restored. Will discontinue IV heparin and restart apixaban.   Goal of Therapy:  Monitor platelets by anticoagulation protocol: Yes   Plan:  Stop heparin drip @ 1000 Give apixaban 2.'5mg'$  twice daily starting at 1000   Thank you for allowing pharmacy to be a part of this patient's care.  Ardyth Harps, PharmD Clinical Pharmacist

## 2021-07-09 NOTE — Progress Notes (Signed)
ANTICOAGULATION CONSULT NOTE Pharmacy Consult for Heparin Indication:  h/o CVA and chronic UE thrombus  Allergies  Allergen Reactions   Sensipar [Cinacalcet]     Per MAR   Statins     Per [facility]    Patient Measurements: Height: '5\' 7"'$  (170.2 cm) Weight: 98 kg (216 lb 0.8 oz) IBW/kg (Calculated) : 61.6 Heparin Dosing Weight:  85 kg  Vital Signs: Temp: 98.4 F (36.9 C) (09/13 2117) Temp Source: Oral (09/13 2117) BP: 145/68 (09/13 2117) Pulse Rate: 95 (09/13 2117)  Labs: Recent Labs    07/07/21 0027 07/07/21 0356 07/07/21 0421 07/07/21 1411 07/08/21 0422 07/08/21 2213  HGB 8.9*  --   --  8.5* 8.9*  --   HCT 27.9*  --   --  27.1* 28.2*  --   PLT 241  --   --  262 265  --   APTT >200*   < > >200* 84*  --  >200*  HEPARINUNFRC 0.87*  --   --   --  0.26* 0.60  CREATININE 3.17*  --   --   --   --   --    < > = values in this interval not displayed.     Estimated Creatinine Clearance: 19 mL/min (A) (by C-G formula based on SCr of 3.17 mg/dL (H)).  Assessment:  75 y.o. female with hx CVA and chronic UE thrombus, Eliquis on hold, for heparin.  Heparin level with in goal range  Goal of Therapy: Heparin level 0.3-0.7 units/ml Monitor platelets by anticoagulation protocol: Yes   Plan:  Continue Heparin at current rate  Follow-up am labs.   Caryl Pina 07/09/2021,12:18 AM

## 2021-07-09 NOTE — Progress Notes (Signed)
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  She had altered mental status seen by neurology MRI similar to prior MRI at [facility] with a large right MCA stroke, seen by nephrology for dialysis need.  Patient having ongoing confusion weakness inability to sit in dialysis recliner and so not accepted back to outpatient dialysis at this time and awaiting further improvement.  She has had poor intake failure to thrive family wanted PEG tube I had tried but was unable to place because of overlying transverse colon and has been on core track NG tube feeding.  Seen by surgery for G-tube placement.  Core track was clogged and changed 07/07/21. 9/13-psych consulted for competency and patient does have functional mental capacity for medical reason making and refused PEG tube.    Subjective: Seen and examined this morning. Resting comfortably Alert awake answer simple questions interactive.  She says she does not like the food here so she does not eat usually.  She is okay with continuing core track tube feeding but does not want PEG tube feeding. Overnight afebrile heart rate in 100s, on room air, blood pressure [id number] systolic  Assessment & Plan:  Acute on chronic lower GI bleeding from stercoral ulcer Status post  EGD and flex sigmoidoscopy by GI.  Stable hemoglobin.  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 Due to stroke in May for which which she had tPA, seen by neurology here.Cont PTOT. She will continue Lipitor, Venlafaxine and her eliquis  Acute encephalopathy in the setting of recent stroke  Failure to thrive/generalized deconditioning, poor oral intake Hypoglycemia Dysphagia in the setting of recent stroke: Cont to augment diet with core track, dietitian following.  Family/son wanting G tube placement but patient has declined/refused, seen by psychiatry 07/08/21, and she is able to make decision.  Okay to continue cortrak tube feeding.   ESRD on HD MWF continue as per nephrology outpatient HD center unable to accept the patient   She was able to do dialysis on chair  since 07/03/21 but not abel to do so in lat few sessions.  She has to sit in the chair 4 to 6 hours to qualify for outpatient HD per Nephro.  Anemia of chronic renal disease monitor hemoglobin.stable. Recent Labs  Lab 07/04/21 0539 07/07/21 0027 07/07/21 1411 07/08/21 0422 07/09/21 0351  HGB 8.7* 8.9* 8.5* 8.9* 8.6*  HCT 27.5* 27.9* 27.1* 28.2* 27.9*    Hypokalemia/hypophosphatemia per HD.  Defer to nephrology.   Constipation continue laxatives    Hypertension/Hypotension:BP stable on midodrine   Type 2 diabetes  mellitus with episodes of hypoglycemia A1c 5.1, diet controlled , continue to monitor blood glucose while on TF.   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  Palliative care nephrology following.  Seen by psychiatry.  That patient able to make functional medical decision.  Overall poor quality of life  Diet Order             Diet regular Room service appropriate? Yes; Fluid consistency: Thin  Diet effective now                   Nutrition Problem: Severe Malnutrition Etiology: chronic illness (ESRD on HD, prior CVA) Signs/Symptoms: percent weight loss, severe muscle depletion, energy intake < or equal to 75% for > or equal to 1 month Percent weight loss: 16.4 % Interventions: Nepro shake, Magic cup, MVI Patient's Body mass index is 33.84 kg/m.  Pressure Injury 05/20/21 Coccyx Mid Stage 2 -  Partial thickness loss of dermis presenting as a shallow open injury with a red, pink wound bed without slough. 1X0.5X0 (Active)  05/20/21 1315  Location: Coccyx  Location Orientation: Mid  Staging: Stage 2 -  Partial thickness loss of dermis presenting as a shallow open injury with a red, pink wound bed without slough.  Wound Description (Comments): 1X0.5X0  Present on Admission: Yes    DVT prophylaxis: apixaban (ELIQUIS) tablet 2.5 mg Start: 07/09/21 1000 Place TED hose Start: 05/19/21 1706 Code Status:   Code Status: DNR  Family Communication: plan of care discussed with patient at bedside.  Discussed in the multidisciplinary rounds.  Son was updated by the team 9/13, is out of town and coming back in 9/19 Status is: Inpatient  Remains inpatient appropriate because:Inpatient level of care appropriate due to severity of illness  Dispo: The patient is from: Home              Anticipated d/c is to: SNF              Patient currently is not medically stable   Difficult to place patient Yes Unresulted Labs (From admission, onward)      Start     Ordered   07/10/21 0500  CBC  Daily,   R       Question:  Specimen collection method  Answer:  Lab=Lab collect   07/09/21 1111            Medications reviewed:  Scheduled Meds:  apixaban  2.5 mg Per Tube BID   atorvastatin  40 mg Per Tube Daily   chlorhexidine  15 mL Mouth Rinse BID   Chlorhexidine Gluconate Cloth  6 each Topical Daily   [START ON 07/10/2021] darbepoetin (ARANESP) injection - DIALYSIS  200 mcg Intravenous Q Thu-HD   feeding supplement (PROSource TF)  45 mL Per Tube Daily   gabapentin  100 mg Per Tube Q12H   insulin aspart  0-6 Units Subcutaneous Q4H   lactulose  20 g Per Tube Daily   mouth rinse  15 mL Mouth Rinse q12n4p   metaxalone  400 mg Per Tube TID   midodrine  10  mg Per Tube TID WC   mirtazapine  7.5 mg Per Tube QHS   multivitamin  1 tablet Per Tube QHS   pantoprazole sodium  40 mg Per Tube Daily   sodium chloride flush  10-40 mL Intracatheter Q12H   sodium chloride flush  3 mL Intravenous Q12H   thiamine  100 mg Per Tube Daily   venlafaxine  37.5 mg Per Tube BID   Continuous Infusions:  sodium chloride     dextrose 50 mL/hr at 07/09/21 1002   feeding supplement (OSMOLITE 1.5 CAL) 1,000 mL (07/09/21 0954)   iron sucrose 100 mg (07/08/21 1320)   Consultants:see note  Procedures:see note Antimicrobials: Anti-infectives (From admission, onward)    Start     Dose/Rate Route Frequency Ordered Stop   07/02/21 1116  ceFAZolin (ANCEF) IVPB 2g/100 mL premix        over 30 Minutes Intravenous Continuous PRN 07/02/21 1139 07/02/21 1116   07/02/21 1111  ceFAZolin (ANCEF) 2-4 GM/100ML-% IVPB       Note to Pharmacy: Domenick Bookbinder   : cabinet override      07/02/21 1111 07/02/21 2314   07/01/21 1215  ceFAZolin (ANCEF) IVPB 2g/100 mL premix        2 g 200 mL/hr over 30 Minutes Intravenous On call 07/01/21 0902 07/02/21 1215   05/29/21 1622  ceFAZolin (ANCEF) 2-4 GM/100ML-% IVPB       Note to Pharmacy: Lytle Butte   : cabinet override       05/29/21 1622 05/30/21 0429   05/29/21 0000  cefTRIAXone (ROCEPHIN) 1 g in sodium chloride 0.9 % 100 mL IVPB  Status:  Discontinued        1 g 200 mL/hr over 30 Minutes Intravenous Every 24 hours 05/28/21 1801 05/28/21 1801   05/28/21 1830  cefTRIAXone (ROCEPHIN) 1 g in sodium chloride 0.9 % 100 mL IVPB        1 g 200 mL/hr over 30 Minutes Intravenous Every 24 hours 05/28/21 1801 05/30/21 1907   05/28/21 1630  ceFAZolin (ANCEF) IVPB 2g/100 mL premix        2 g 200 mL/hr over 30 Minutes Intravenous To Radiology 05/28/21 1537 05/29/21 1630      Culture/Microbiology    Component Value Date/Time   SDES BLOOD RIGHT ANTECUBITAL 06/26/2021 1234   SPECREQUEST  06/26/2021 1234    BOTTLES DRAWN AEROBIC AND ANAEROBIC Blood Culture adequate volume   CULT  06/26/2021 1234    NO GROWTH 5 DAYS Performed at [facility], [place] [address], [place], [place] [address]    REPTSTATUS 07/01/2021 FINAL 06/26/2021 1234    Other culture-see note  Objective: Vitals: Today's Vitals   07/08/21 2117 07/09/21 0532 07/09/21 1037 07/09/21 1049  BP: (!) 145/68 (!) 149/73 (!) 142/66   Pulse: 95 (!) 103 99   Resp: '16 18 18   '$ Temp: 98.4 F (36.9 C) 98.1 F (36.7 C) 98.1 F (36.7 C)   TempSrc: Oral Oral Axillary   SpO2: 100% 100% 100%   Weight:      Height:      PainSc:    0-No pain    Intake/Output Summary (Last 24 hours) at 07/09/2021 1324 Last data filed at 07/09/2021 0900 Gross per 24 hour  Intake 2182.95 ml  Output 1 ml  Net 2181.95 ml   Filed Weights   07/08/21 0815 07/08/21 0850 07/08/21 1250  Weight: 101.2 kg 102.1 kg 98 kg   Weight change: 0.907 kg  Intake/Output from  previous day: 09/13 0701 - 09/14 0700 In: 3111.8 [I.V.:1346; NG/GT:1450.8; IV Piggyback:315] Out: [id number] [Stool:1] Intake/Output this shift: No intake/output data recorded. Filed Weights   07/08/21 0815 07/08/21 0850 07/08/21 1250  Weight: 101.2 kg 102.1 kg 98 kg   Examination:  General exam: AAOx to self,  place, current president, current year but got month wrong HEENT:Oral mucosa moist, Ear/Nose WNL grossly, dentition normal. Respiratory system: bilaterally diminished, no use of accessory muscle Cardiovascular system: S1 & S2 +, No JVD,. Gastrointestinal system: Abdomen soft,NT,ND, BS+ Nervous System:Alert, awake, moving extremities,weakness left side Extremities: UE edema+,LUE spastic/contracture, Skin: No rashes,no icterus. MSK: Normal muscle bulk,tone, power   Data Reviewed: I have personally reviewed following labs and imaging studies CBC: Recent Labs  Lab 07/04/21 0539 07/07/21 0027 07/07/21 1411 07/08/21 0422 07/09/21 0351  WBC 10.9* 10.0 8.2 9.3 9.5  NEUTROABS 7.9*  --   --   --   --   HGB 8.7* 8.9* 8.5* 8.9* 8.6*  HCT 27.5* 27.9* 27.1* 28.2* 27.9*  MCV 92.9 92.1 94.1 93.7 95.2  PLT 174 241 262 265 [id number]   Basic Metabolic Panel: Recent Labs  Lab 07/04/21 0539 07/07/21 0027 07/09/21 0351  NA 132* 131* 131*  K 4.5 4.0 4.3  CL 92* 96* 95*  CO2 '28 23 27  '$ GLUCOSE 133* 73 204*  BUN 28* 20 16  CREATININE 3.23* 3.17* 2.61*  CALCIUM 8.3* 8.2* 8.3*  PHOS 3.0  --  2.4*   GFR: Estimated Creatinine Clearance: 23.1 mL/min (A) (by C-G formula based on SCr of 2.61 mg/dL (H)). Liver Function Tests: Recent Labs  Lab 07/04/21 0539 07/07/21 0027 07/09/21 0351  AST  --  28  --   ALT  --  6  --   ALKPHOS  --  90  --   BILITOT  --  0.4  --   PROT  --  5.8*  --   ALBUMIN 1.8* 1.8* 2.2*   No results for input(s): LIPASE, AMYLASE in the last 168 hours. No results for input(s): AMMONIA in the last 168 hours. Coagulation Profile: No results for input(s): INR, PROTIME in the last 168 hours. Cardiac Enzymes: No results for input(s): CKTOTAL, CKMB, CKMBINDEX, TROPONINI in the last 168 hours. BNP (last 3 results) No results for input(s): PROBNP in the last 8760 hours. HbA1C: No results for input(s): HGBA1C in the last 72 hours. CBG: Recent Labs  Lab 07/08/21 1945  07/08/21 2354 07/09/21 0413 07/09/21 0733 07/09/21 1137  GLUCAP 166* 188* 223* 160* 184*   Lipid Profile: No results for input(s): CHOL, HDL, LDLCALC, TRIG, CHOLHDL, LDLDIRECT in the last 72 hours. Thyroid Function Tests: No results for input(s): TSH, T4TOTAL, FREET4, T3FREE, THYROIDAB in the last 72 hours. Anemia Panel: No results for input(s): VITAMINB12, FOLATE, FERRITIN, TIBC, IRON, RETICCTPCT in the last 72 hours. Sepsis Labs: No results for input(s): PROCALCITON, LATICACIDVEN in the last 168 hours.  No results found for this or any previous visit (from the past 240 hour(s)).     Radiology Studies: DG Abd Portable 1V  Result Date: 07/07/2021 CLINICAL DATA:  Feeding tube placement. EXAM: PORTABLE ABDOMEN - 1 VIEW COMPARISON:  None. FINDINGS: The bowel gas pattern is normal. Distal tip of feeding tube is seen in the expected position of the distal stomach or proximal duodenum. No radio-opaque calculi or other significant radiographic abnormality are seen. IMPRESSION: Distal tip of feeding tube is seen in expected position of distal stomach or proximal duodenum. Electronically Signed   By: Jeneen Rinks  Murlean Caller M.D.   On: 07/07/2021 14:17     LOS: 50 days   Antonieta Pert, MD [facility]  07/09/2021, 1:24 PM

## 2021-07-09 NOTE — Consult Note (Signed)
[facility] Nurse wound follow up Patient has been seen by [facility] team 05/30/21, 06/11/21 and 07/05/21.  Each time wound has declined.  Due to severe malnutrition in the context of chronic illness, the skin has little potential to heal.  Patient is on a mattress with low air loss feature and turning and repositioning are in place.  Patient is on dialysis Mon/Wed/Fri.  She is refusing a  PEG tube, despite this, her son wishes her to have this.  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  She is requiring restraints to prohibit her pulling this out. Psychiatry has assessed her and determined she is able to make her own medical decisions.  Unclear on next steps with nutrition, but this is a clear barrier to wound healing.   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  Wound was first noted on admission 05/19/21.  Measurement: 3 cm x 3 cm x 2 cm with palpable bone Wound [id number] red Drainage (amount, consistency, odor) moderate serosanguinous   Periwound:moist Dressing procedure/placement/frequency:Stop Santyl.  Cleanse coccyx wound with NS and pat dry. FIll wound depth with alginate dressing. [facility] # [id number]) Cover with silicone foam dressing. Change Mon/Wed/Fri and PRN soilage.Barrier cream to perineal skin.  No disposable underpads or briefs while in bed.   Will not follow at this time.  Please re-consult if needed.  Domenic Moras MSN, RN, FNP-BC CWON Wound, Ostomy, Continence Nurse Pager [phone]

## 2021-07-10 DIAGNOSIS — K922 Gastrointestinal hemorrhage, unspecified: Secondary | ICD-10-CM | POA: Diagnosis not present

## 2021-07-10 LAB — RENAL FUNCTION PANEL
Albumin: 1.9 g/dL — ABNORMAL LOW (ref 3.5–5.0)
Anion gap: 10 (ref 5–15)
BUN: 28 mg/dL — ABNORMAL HIGH (ref 8–23)
CO2: 26 mmol/L (ref 22–32)
Calcium: 7.9 mg/dL — ABNORMAL LOW (ref 8.9–10.3)
Chloride: 89 mmol/L — ABNORMAL LOW (ref 98–111)
Creatinine, Ser: 3.32 mg/dL — ABNORMAL HIGH (ref 0.44–1.00)
GFR, Estimated: 14 mL/min — ABNORMAL LOW (ref >60)
Glucose, Bld: 165 mg/dL — ABNORMAL HIGH (ref 70–99)
Phosphorus: 2.2 mg/dL — ABNORMAL LOW (ref 2.5–4.6)
Potassium: 5 mmol/L (ref 3.5–5.1)
Sodium: 125 mmol/L — ABNORMAL LOW (ref 135–145)

## 2021-07-10 LAB — CBC
HCT: 26.8 % — ABNORMAL LOW (ref 36.0–46.0)
Hemoglobin: 8.3 g/dL — ABNORMAL LOW (ref 12.0–15.0)
MCH: 29.6 pg (ref 26.0–34.0)
MCHC: 31 g/dL (ref 30.0–36.0)
MCV: 95.7 fL (ref 80.0–100.0)
Platelets: 234 10*3/uL (ref 150–400)
RBC: 2.8 MIL/uL — ABNORMAL LOW (ref 3.87–5.11)
RDW: 18.4 % — ABNORMAL HIGH (ref 11.5–15.5)
WBC: 12.5 10*3/uL — ABNORMAL HIGH (ref 4.0–10.5)
nRBC: 0 % (ref 0.0–0.2)

## 2021-07-10 LAB — GLUCOSE, CAPILLARY
Glucose-Capillary: 137 mg/dL — ABNORMAL HIGH (ref 70–99)
Glucose-Capillary: 168 mg/dL — ABNORMAL HIGH (ref 70–99)
Glucose-Capillary: 173 mg/dL — ABNORMAL HIGH (ref 70–99)
Glucose-Capillary: 182 mg/dL — ABNORMAL HIGH (ref 70–99)
Glucose-Capillary: 192 mg/dL — ABNORMAL HIGH (ref 70–99)
Glucose-Capillary: 198 mg/dL — ABNORMAL HIGH (ref 70–99)

## 2021-07-10 MED ORDER — PANTOPRAZOLE 2 MG/ML SUSPENSION
40.0000 mg | Freq: Every day | ORAL | Status: DC
  Administered 2021-07-11 – 2021-07-20 (×10): 40 mg
  Filled 2021-07-10 (×7): qty 20

## 2021-07-10 NOTE — Plan of Care (Signed)
  Problem: Nutrition: Goal: Adequate nutrition will be maintained Outcome: Progressing   Problem: Skin Integrity: Goal: Risk for impaired skin integrity will decrease Outcome: Progressing   

## 2021-07-10 NOTE — Progress Notes (Addendum)
[facility] Progress Note   Subjective:    Patient seen and examined at bedside. Resting comfortably. No acute changes. Plan for HD today.  Objective Vitals:   07/09/21 1037 07/09/21 1641 07/10/21 0354 07/10/21 1026  BP: (!) 142/66 (!) 144/87 (!) 158/59 (!) 110/96  Pulse: 99 91 93 97  Resp: '18 18 18 18  '$ Temp: 98.1 F (36.7 C) 98.6 F (37 C) 98 F (36.7 C) 99.2 F (37.3 C)  TempSrc: Axillary   Oral  SpO2: 100% 100% 99% 100%  Weight:      Height:       Physical Exam General: Chronically ill appearing elderly female; NAD Heart:RRR, no mrg Lungs:CTAB anterolaterally, nml WOB on RA  Abdomen: Soft and non-tender; active bowel sounds Extremities: BLUEs edema improving 1-2+; 1+ bilateral hips and LE. Dialysis Access: [facility]   Filed Weights   07/08/21 0815 07/08/21 0850 07/08/21 1250  Weight: 101.2 kg 102.1 kg 98 kg    Intake/Output Summary (Last 24 hours) at 07/10/2021 1205 Last data filed at 07/10/2021 0857 Gross per 24 hour  Intake 1337.67 ml  Output 0 ml  Net 1337.67 ml    Additional Objective Labs: Basic Metabolic Panel: Recent Labs  Lab 07/04/21 0539 07/07/21 0027 07/09/21 0351  NA 132* 131* 131*  K 4.5 4.0 4.3  CL 92* 96* 95*  CO2 '28 23 27  '$ GLUCOSE 133* 73 204*  BUN 28* 20 16  CREATININE 3.23* 3.17* 2.61*  CALCIUM 8.3* 8.2* 8.3*  PHOS 3.0  --  2.4*   Liver Function Tests: Recent Labs  Lab 07/04/21 0539 07/07/21 0027 07/09/21 0351  AST  --  28  --   ALT  --  6  --   ALKPHOS  --  90  --   BILITOT  --  0.4  --   PROT  --  5.8*  --   ALBUMIN 1.8* 1.8* 2.2*   No results for input(s): LIPASE, AMYLASE in the last 168 hours. CBC: Recent Labs  Lab 07/04/21 0539 07/07/21 0027 07/07/21 1411 07/08/21 0422 07/09/21 0351  WBC 10.9* 10.0 8.2 9.3 9.5  NEUTROABS 7.9*  --   --   --   --   HGB 8.7* 8.9* 8.5* 8.9* 8.6*  HCT 27.5* 27.9* 27.1* 28.2* 27.9*  MCV 92.9 92.1 94.1 93.7 95.2  PLT 174 241 262 265 186   Blood Culture    Component  Value Date/Time   SDES BLOOD RIGHT ANTECUBITAL 06/26/2021 1234   SPECREQUEST  06/26/2021 1234    BOTTLES DRAWN AEROBIC AND ANAEROBIC Blood Culture adequate volume   CULT  06/26/2021 1234    NO GROWTH 5 DAYS Performed at [facility], [place] [address], [place], [place] [address]    REPTSTATUS 07/01/2021 FINAL 06/26/2021 1234    Cardiac Enzymes: No results for input(s): CKTOTAL, CKMB, CKMBINDEX, TROPONINI in the last 168 hours. CBG: Recent Labs  Lab 07/09/21 2018 07/09/21 2357 07/10/21 0347 07/10/21 0730 07/10/21 1145  GLUCAP 166* 192* 198* 182* 168*   Iron Studies: No results for input(s): IRON, TIBC, TRANSFERRIN, FERRITIN in the last 72 hours. Lab Results  Component Value Date   INR 0.9 03/10/2021   INR 1.0 01/26/2021   Studies/Results: No results found.  Medications:  sodium chloride     sodium chloride     sodium chloride     feeding supplement (OSMOLITE 1.5 CAL) 1,000 mL (07/10/21 0540)   iron sucrose 100 mg (07/08/21 1320)    apixaban  2.5  mg Per Tube BID   atorvastatin  40 mg Per Tube Daily   chlorhexidine  15 mL Mouth Rinse BID   Chlorhexidine Gluconate Cloth  6 each Topical Daily   darbepoetin (ARANESP) injection - DIALYSIS  200 mcg Intravenous Q Thu-HD   feeding supplement (PROSource TF)  45 mL Per Tube Daily   gabapentin  100 mg Per Tube Q12H   insulin aspart  0-6 Units Subcutaneous Q4H   lactulose  20 g Per Tube Daily   mouth rinse  15 mL Mouth Rinse q12n4p   metaxalone  400 mg Per Tube TID   midodrine  10 mg Per Tube TID WC   mirtazapine  7.5 mg Per Tube QHS   multivitamin  1 tablet Per Tube QHS   pantoprazole sodium  40 mg Per Tube Daily   sodium chloride flush  10-40 mL Intracatheter Q12H   sodium chloride flush  3 mL Intravenous Q12H   thiamine  100 mg Per Tube Daily   venlafaxine  37.5 mg Per Tube BID    Dialysis Orders: AF MWF  3h 56mn 400/500    62.5kg    3K/2.5 Ca    P2  AVG HeRO   Hep none - Mircera 50 q 2 wks  (7/13)  Venofer 50  q wk - Parsabiv '5mg'$  IV q HD  Assessment/Plan: AMS/FTT: Pt will have to be able to sit in a chair for 4-6 hrs to qualify for OP HD w/ our group. Mental status is improving and she is tolerating being in chair during HD. HD done  in the bed d/t restraints on 9/13. Patient needs to not require restraints for outpatient hemodialysis. Moving forward, she needs to be in the chair for all HD treatments and assess restraint requirements daily. If she continues to tolerate sitting in the chair for dialysis and cooperate, then will re-CLIP the patient as her OP HD unit will not take her back. HD Nurse Coordinator and Renal Navigator following, stated prior to re-clipping her, we need to ensure patient nutritional status is stable.  Melena/Rectal bleeding - GI consulted. Hx rectal ulcers.  EGD 7/27 normal, flex sig with mucosal ulceration c/w sterocoral ulcers, non bleeding. Hgb 8.6. ESRD: usual schedule MWF. Now on TTS while here. HD today..Marland KitchenDysphagia - Patient seen by Psych 9/14-reviewed note: It is determined that patient has functional mental capacity for medical decision-making. Patient refusing G-tube placement. Access Issues/AVG clotted: Hadf'gram  6/23 which showed chronic thrombus and was started on Eliquis. AVG clotted 9/2, unable to be declotted d/t arm contracture. S/P conversion to tunneled HD catheter 9/7 via IR-on Heparin drip, appreciate their assistance. 6. Hypertension/volume: BP stable, on midodrine '10mg'$  TID with significant overload on exam. Net UF Need to ^ UFG with HD as tolerated, can give IV albumin prn.  UF goal 4-4.5L as tolerated.  7. Nutrition: Albumin 1.7. Getting TFs via Core Track-RD managing tube. 8.Recent CVA with no evidence of meaningful recovery.  9. Anemia: Hgb down to 8.6, continue Aranesp-dose recently raised scheduled to receive 9/15..  10. Dispo: DNR/DNI. Poor prognosis. Palliative care has seen see #1. Appreciate their involvement. Although patient is currently tolerating  being in the chair for HD, we do not believe dialysis is adding to her quality of life. Per family communication, family wishes to continue. Please see #4 for update. Advised HD staff to continue monitoring patient's behavior during HD. Although behavior is improving, still has occasional episodes of crying during HD. Previously discussed with primary team  that family members sit with patient during HD to see the reality of situation and to hopefully console her if she cries during HD. Patient is refusing G-tube placement which places another barrier because patient cannot be discharged with the [facility] (discussed this with HD coordinator). Ongoing discussions with multi-discipline team-continue CoreTrack with tube feedings.  Tobie Poet, NP [facility] 07/10/2021,12:05 PM  LOS: 51 days    Seen and examined independently.  Agree with note and exam as documented above by physician extender and as noted here.  Patient is not able to provide reliable hx.  Has been getting dextrose infusion at 50 ml/hr. In the past refused enteric tube.   General elderly female in bed in no acute distress HEENT normocephalic atraumatic Neck supple trachea midline Lungs clear to auscultation bilaterally normal work of breathing at rest  Heart S1S2 no rub Abdomen soft nontender nondistended Extremities no lower extremity edema; left arm contracture with a limb restriction band   Psych no anxiety or agitation  Neuro - year is "1021" per pt; oriented to person and does not know location Left IJ tunn catheter   ESRD for HD per TTS schedule for now; palliative care involved and poor prognosis.  Must do HD in chair to be able to get outpatient HD and would not be able to be in restraints - would please stop dextrose when able per primary team - would avoid skelaxin in ESRD patient  - am labs are still not available.  Order to page nephrology on call if K is above 5.5.    Dysphagia - per primary.   See pt refusing G tube    Anemia and CKD - on ESA   Chronic hypotension - on midodrine   Appreciate primary team and palliative   Claudia Desanctis, MD 07/10/2021  1:58 PM

## 2021-07-10 NOTE — Progress Notes (Signed)
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  She had altered mental status seen by neurology MRI similar to prior MRI at [facility] with a large right MCA stroke, seen by nephrology for dialysis need.  Patient having ongoing confusion weakness inability to sit in dialysis recliner and so not accepted back to outpatient dialysis at this time and awaiting further improvement.  She has had poor intake failure to thrive family wanted PEG tube I had tried but was unable to place because of overlying transverse colon and has been on core track NG tube feeding.  Seen by surgery for G-tube placement.  Core track was clogged and changed 07/07/21. 9/13-psych consulted for competency and patient does have functional mental capacity for medical decision,refused G tube.    Subjective:  Seen and examined this morning alert awake oriented to self  Reports seeing cat.  Waiting for dialysis today.    Assessment & Plan:  Acute on chronic lower GI bleeding from stercoral ulcer Status post EGD and flex sigmoidoscopy by GI.  Stable hemoglobin.  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 Due to stroke in May for which which she had tPA, seen by neurology here.Cont PTOT.  Can continue Eliquis, Lipitor.  Acute encephalopathy in the setting of recent stroke  Failure to thrive/generalized deconditioning, poor oral intake Hypoglycemia-resolved off D10. Dysphagia in the setting of recent stroke: Cont to augment diet with core track, dietitian following.  Family/son wanting G tube placement but patient has declined/refused, seen by psychiatry 07/08/21, and she is able to make decision.     ESRD on HD MWF on outpatient currently on TTS continue as per nephrology outpatient HD center unable to accept the patient   She was able to do dialysis on chair  since 07/03/21 but not abel to do so in last few sessions.  She has to sit in the chair 4 to 6 hours to qualify for outpatient HD per Nephro.  Anemia of chronic renal disease monitor hemoglobin.stable. Recent Labs  Lab 07/04/21 0539 07/07/21 0027 07/07/21 1411 07/08/21 0422 07/09/21 0351  HGB 8.7* 8.9* 8.5* 8.9* 8.6*  HCT 27.5* 27.9* 27.1* 28.2* 27.9*     Hypokalemia/hypophosphatemia per HD.   Constipation continue laxatives, having daily Bms.   Hypertension/Hypotension:BP currently stable on midodrine   Type 2 diabetes mellitus with episodes of hypoglycemia A1c 5.1, diet controlled. continue to monitor blood glucose while on TF.   Recent Labs  Lab 07/09/21 2018 07/09/21 2357 07/10/21 0347 07/10/21 0730 07/10/21 1145  GLUCAP 166* 192* 198* 182* 168*  Chronic thrombus left upper extremity continue on Eliquis.  Was changed to heaprin while cortrak was clogged- resume eliquis   GERD on PPI   Stage II mid coccyx pressure injury POA continue wound care  Goals of care currently DNR family son is available over the phone but at times difficult to reach.  Palliative care  nephrology following.  Seen by psychiatry.  That patient able to make medical decision.  Overall poor quality of life  Diet Order             Diet regular Room service appropriate? Yes; Fluid consistency: Thin  Diet effective now                   Nutrition Problem: Severe Malnutrition Etiology: chronic illness (ESRD on HD, prior CVA) Signs/Symptoms: percent weight loss, severe muscle depletion, energy intake < or equal to 75% for > or equal to 1 month Percent weight loss: 16.4 % Interventions: Nepro shake, Magic cup, MVI Patient's Body mass index is 33.84 kg/m.  Pressure Injury 05/20/21 Coccyx Mid Stage 2 -  Partial thickness loss of dermis presenting as a shallow open injury with a red, pink wound bed without slough. 1X0.5X0 (Active)  05/20/21 1315  Location: Coccyx  Location Orientation: Mid  Staging: Stage 2 -  Partial thickness loss of dermis presenting as a shallow open injury with a red, pink wound bed without slough.  Wound Description (Comments): 1X0.5X0  Present on Admission: Yes    DVT prophylaxis: apixaban (ELIQUIS) tablet 2.5 mg Start: 07/09/21 1000 Place TED hose Start: 05/19/21 1706 Code Status:   Code Status: DNR  Family Communication: plan of care discussed with patient at bedside.  Discussed in the multidisciplinary rounds.  Son was updated by the team 9/13, is out of town and coming back in 9/19 Status is: Inpatient  Remains inpatient appropriate because:Inpatient level of care appropriate due to severity of illness  Dispo: The patient is from: Home              Anticipated d/c is to: SNF              Patient currently is not medically stable   Difficult to place patient Yes Unresulted Labs (From admission, onward)     Start     Ordered   07/10/21 0928  Renal function panel  Once,   R       Question:  Specimen collection method  Answer:  Lab=Lab collect   07/10/21 0928   07/10/21 0500  CBC  Daily,   R       Question:  Specimen collection method   Answer:  Lab=Lab collect   07/09/21 1111            Medications reviewed:  Scheduled Meds:  apixaban  2.5 mg Per Tube BID   atorvastatin  40 mg Per Tube Daily   chlorhexidine  15 mL Mouth Rinse BID   Chlorhexidine Gluconate Cloth  6 each Topical Daily   darbepoetin (ARANESP) injection - DIALYSIS  200 mcg Intravenous Q Thu-HD   feeding supplement (PROSource TF)  45 mL Per Tube Daily   gabapentin  100 mg Per Tube Q12H   insulin aspart  0-6 Units Subcutaneous Q4H   lactulose  20 g Per Tube Daily   mouth rinse  15 mL Mouth Rinse q12n4p   metaxalone  400 mg Per Tube TID   midodrine  10 mg Per Tube TID WC   mirtazapine  7.5 mg Per  Tube QHS   multivitamin  1 tablet Per Tube QHS   pantoprazole sodium  40 mg Per Tube Daily   sodium chloride flush  10-40 mL Intracatheter Q12H   sodium chloride flush  3 mL Intravenous Q12H   thiamine  100 mg Per Tube Daily   venlafaxine  37.5 mg Per Tube BID   Continuous Infusions:  sodium chloride     sodium chloride     sodium chloride     feeding supplement (OSMOLITE 1.5 CAL) 50 mL/hr at 07/10/21 1200   iron sucrose 100 mg (07/08/21 1320)   Consultants:see note  Procedures:see note Antimicrobials: Anti-infectives (From admission, onward)    Start     Dose/Rate Route Frequency Ordered Stop   07/02/21 1116  ceFAZolin (ANCEF) IVPB 2g/100 mL premix        over 30 Minutes Intravenous Continuous PRN 07/02/21 1139 07/02/21 1116   07/02/21 1111  ceFAZolin (ANCEF) 2-4 GM/100ML-% IVPB       Note to Pharmacy: Domenick Bookbinder   : cabinet override      07/02/21 1111 07/02/21 2314   07/01/21 1215  ceFAZolin (ANCEF) IVPB 2g/100 mL premix        2 g 200 mL/hr over 30 Minutes Intravenous On call 07/01/21 0902 07/02/21 1215   05/29/21 1622  ceFAZolin (ANCEF) 2-4 GM/100ML-% IVPB       Note to Pharmacy: Lytle Butte   : cabinet override      05/29/21 1622 05/30/21 0429   05/29/21 0000  cefTRIAXone (ROCEPHIN) 1 g in sodium chloride 0.9 % 100 mL IVPB   Status:  Discontinued        1 g 200 mL/hr over 30 Minutes Intravenous Every 24 hours 05/28/21 1801 05/28/21 1801   05/28/21 1830  cefTRIAXone (ROCEPHIN) 1 g in sodium chloride 0.9 % 100 mL IVPB        1 g 200 mL/hr over 30 Minutes Intravenous Every 24 hours 05/28/21 1801 05/30/21 1907   05/28/21 1630  ceFAZolin (ANCEF) IVPB 2g/100 mL premix        2 g 200 mL/hr over 30 Minutes Intravenous To Radiology 05/28/21 1537 05/29/21 1630      Culture/Microbiology    Component Value Date/Time   SDES BLOOD RIGHT ANTECUBITAL 06/26/2021 1234   SPECREQUEST  06/26/2021 1234    BOTTLES DRAWN AEROBIC AND ANAEROBIC Blood Culture adequate volume   CULT  06/26/2021 1234    NO GROWTH 5 DAYS Performed at [facility], [place] [address], [place], [place] [address]    REPTSTATUS 07/01/2021 FINAL 06/26/2021 1234    Other culture-see note  Objective: Vitals: Today's Vitals   07/10/21 0045 07/10/21 0354 07/10/21 0800 07/10/21 1026  BP:  (!) 158/59  (!) 110/96  Pulse:  93  97  Resp:  18  18  Temp:  98 F (36.7 C)  99.2 F (37.3 C)  TempSrc:    Oral  SpO2:  99%  100%  Weight:      Height:      PainSc: Asleep  0-No pain     Intake/Output Summary (Last 24 hours) at 07/10/2021 1301 Last data filed at 07/10/2021 1218 Gross per 24 hour  Intake 2533.58 ml  Output 0 ml  Net 2533.58 ml    Filed Weights   07/08/21 0815 07/08/21 0850 07/08/21 1250  Weight: 101.2 kg 102.1 kg 98 kg   Weight change:   Intake/Output from previous day: 09/14 0701 - 09/15 0700 In: 789.7 [I.V.:389.7; NG/GT:400] Out:  0  Intake/Output this shift: Total I/O In: 2136.1 [I.V.:1036.1; NG/GT:1100] Out: -  Filed Weights   07/08/21 0815 07/08/21 0850 07/08/21 1250  Weight: 101.2 kg 102.1 kg 98 kg   Examination:  General exam: AAOx2, elderly, frail, HEENT:Oral mucosa moist, Ear/Nose WNL grossly, dentition normal. Respiratory system: bilaterally clear breath sounds, no use of accessory muscle Cardiovascular system:  S1 & S2 +, No JVD,. Gastrointestinal system: Abdomen soft,NT,ND, BS+. HD catheter site+ Nervous System:Alert, awake, moving extremities and grossly nonfocal Extremities: Swollen upper extremities, spastic left upper extremity  Skin: No rashes,no icterus.  MSK: Normal muscle bulk,tone, power   Data Reviewed: I have personally reviewed following labs and imaging studies CBC: Recent Labs  Lab 07/04/21 0539 07/07/21 0027 07/07/21 1411 07/08/21 0422 07/09/21 0351  WBC 10.9* 10.0 8.2 9.3 9.5  NEUTROABS 7.9*  --   --   --   --   HGB 8.7* 8.9* 8.5* 8.9* 8.6*  HCT 27.5* 27.9* 27.1* 28.2* 27.9*  MCV 92.9 92.1 94.1 93.7 95.2  PLT 174 241 262 265 [id number]    Basic Metabolic Panel: Recent Labs  Lab 07/04/21 0539 07/07/21 0027 07/09/21 0351  NA 132* 131* 131*  K 4.5 4.0 4.3  CL 92* 96* 95*  CO2 '28 23 27  '$ GLUCOSE 133* 73 204*  BUN 28* 20 16  CREATININE 3.23* 3.17* 2.61*  CALCIUM 8.3* 8.2* 8.3*  PHOS 3.0  --  2.4*    GFR: Estimated Creatinine Clearance: 23.1 mL/min (A) (by C-G formula based on SCr of 2.61 mg/dL (H)). Liver Function Tests: Recent Labs  Lab 07/04/21 0539 07/07/21 0027 07/09/21 0351  AST  --  28  --   ALT  --  6  --   ALKPHOS  --  90  --   BILITOT  --  0.4  --   PROT  --  5.8*  --   ALBUMIN 1.8* 1.8* 2.2*    No results for input(s): LIPASE, AMYLASE in the last 168 hours. No results for input(s): AMMONIA in the last 168 hours. Coagulation Profile: No results for input(s): INR, PROTIME in the last 168 hours. Cardiac Enzymes: No results for input(s): CKTOTAL, CKMB, CKMBINDEX, TROPONINI in the last 168 hours. BNP (last 3 results) No results for input(s): PROBNP in the last 8760 hours. HbA1C: No results for input(s): HGBA1C in the last 72 hours. CBG: Recent Labs  Lab 07/09/21 2018 07/09/21 2357 07/10/21 0347 07/10/21 0730 07/10/21 1145  GLUCAP 166* 192* 198* 182* 168*    Lipid Profile: No results for input(s): CHOL, HDL, LDLCALC, TRIG, CHOLHDL,  LDLDIRECT in the last 72 hours. Thyroid Function Tests: No results for input(s): TSH, T4TOTAL, FREET4, T3FREE, THYROIDAB in the last 72 hours. Anemia Panel: No results for input(s): VITAMINB12, FOLATE, FERRITIN, TIBC, IRON, RETICCTPCT in the last 72 hours. Sepsis Labs: No results for input(s): PROCALCITON, LATICACIDVEN in the last 168 hours.  No results found for this or any previous visit (from the past 240 hour(s)).     Radiology Studies: No results found.   LOS: 85 days   Antonieta Pert, MD [facility]  07/10/2021, 1:01 PM

## 2021-07-11 DIAGNOSIS — D638 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: Secondary | ICD-10-CM | POA: Diagnosis not present

## 2021-07-11 DIAGNOSIS — K922 Gastrointestinal hemorrhage, unspecified: Secondary | ICD-10-CM | POA: Diagnosis not present

## 2021-07-11 LAB — RENAL FUNCTION PANEL
Albumin: 2 g/dL — ABNORMAL LOW (ref 3.5–5.0)
Anion gap: 7 (ref 5–15)
BUN: 18 mg/dL (ref 8–23)
CO2: 30 mmol/L (ref 22–32)
Calcium: 8.3 mg/dL — ABNORMAL LOW (ref 8.9–10.3)
Chloride: 94 mmol/L — ABNORMAL LOW (ref 98–111)
Creatinine, Ser: 2.4 mg/dL — ABNORMAL HIGH (ref 0.44–1.00)
GFR, Estimated: 21 mL/min — ABNORMAL LOW (ref >60)
Glucose, Bld: 129 mg/dL — ABNORMAL HIGH (ref 70–99)
Phosphorus: 1.9 mg/dL — ABNORMAL LOW (ref 2.5–4.6)
Potassium: 4.4 mmol/L (ref 3.5–5.1)
Sodium: 131 mmol/L — ABNORMAL LOW (ref 135–145)

## 2021-07-11 LAB — GLUCOSE, CAPILLARY
Glucose-Capillary: 175 mg/dL — ABNORMAL HIGH (ref 70–99)
Glucose-Capillary: 107 mg/dL — ABNORMAL HIGH (ref 70–99)
Glucose-Capillary: 131 mg/dL — ABNORMAL HIGH (ref 70–99)
Glucose-Capillary: 139 mg/dL — ABNORMAL HIGH (ref 70–99)
Glucose-Capillary: 137 mg/dL — ABNORMAL HIGH (ref 70–99)

## 2021-07-11 MED ORDER — ENSURE ENLIVE PO LIQD
237.0000 mL | Freq: Two times a day (BID) | ORAL | Status: DC
  Administered 2021-07-21 – 2021-08-22 (×32): 237 mL via ORAL

## 2021-07-11 NOTE — Progress Notes (Addendum)
[facility] Progress Note   Subjective:    Patient seen and examined at bedside. Resting comfortably. Answers questions appropriately  HD yesterday with net UF 3.3L   Objective Vitals:   07/10/21 1855 07/10/21 2022 07/11/21 0445 07/11/21 0938  BP: (!) 138/54 (!) 109/57 118/71 (!) 138/52  Pulse: 95 (!) 110 (!) 105 94  Resp: '18 20 19 14  '$ Temp: 98.7 F (37.1 C) 98.6 F (37 C) 98.8 F (37.1 C) 98.4 F (36.9 C)  TempSrc: Axillary     SpO2: 95% 100% 100% 100%  Weight:      Height:       Physical Exam General: Chronically ill appearing elderly female; NAD Heart:RRR, no mrg Lungs:CTAB anterolaterally, nml WOB on RA  Abdomen: Soft and non-tender; active bowel sounds Extremities: BLUEs edema improving  Dialysis Access: LUE TDC  in place   [facility] Weights   07/08/21 0850 07/08/21 1250 07/10/21 1510  Weight: 102.1 kg 98 kg 100.9 kg    Intake/Output Summary (Last 24 hours) at 07/11/2021 1027 Last data filed at 07/11/2021 0700 Gross per 24 hour  Intake 2215.91 ml  Output 3300 ml  Net -1084.09 ml     Additional Objective Labs: Basic Metabolic Panel: Recent Labs  Lab 07/07/21 0027 07/09/21 0351 07/10/21 1532  NA 131* 131* 125*  K 4.0 4.3 5.0  CL 96* 95* 89*  CO2 '23 27 26  '$ GLUCOSE 73 204* 165*  BUN 20 16 28*  CREATININE 3.17* 2.61* 3.32*  CALCIUM 8.2* 8.3* 7.9*  PHOS  --  2.4* 2.2*    Liver Function Tests: Recent Labs  Lab 07/07/21 0027 07/09/21 0351 07/10/21 1532  AST 28  --   --   ALT 6  --   --   ALKPHOS 90  --   --   BILITOT 0.4  --   --   PROT 5.8*  --   --   ALBUMIN 1.8* 2.2* 1.9*    No results for input(s): LIPASE, AMYLASE in the last 168 hours. CBC: Recent Labs  Lab 07/07/21 0027 07/07/21 1411 07/08/21 0422 07/09/21 0351 07/10/21 1532  WBC 10.0 8.2 9.3 9.5 12.5*  HGB 8.9* 8.5* 8.9* 8.6* 8.3*  HCT 27.9* 27.1* 28.2* 27.9* 26.8*  MCV 92.1 94.1 93.7 95.2 95.7  PLT 241 262 265 186 234    Blood Culture    Component Value  Date/Time   SDES BLOOD RIGHT ANTECUBITAL 06/26/2021 1234   SPECREQUEST  06/26/2021 1234    BOTTLES DRAWN AEROBIC AND ANAEROBIC Blood Culture adequate volume   CULT  06/26/2021 1234    NO GROWTH 5 DAYS Performed at [facility], [place] [address], [place], [place] [address]    REPTSTATUS 07/01/2021 FINAL 06/26/2021 1234    Cardiac Enzymes: No results for input(s): CKTOTAL, CKMB, CKMBINDEX, TROPONINI in the last 168 hours. CBG: Recent Labs  Lab 07/10/21 1145 07/10/21 1949 07/10/21 2338 07/11/21 0434 07/11/21 0729  GLUCAP 168* 137* 173* 175* 107*    Iron Studies: No results for input(s): IRON, TIBC, TRANSFERRIN, FERRITIN in the last 72 hours. Lab Results  Component Value Date   INR 0.9 03/10/2021   INR 1.0 01/26/2021   Studies/Results: No results found.  Medications:  sodium chloride     feeding supplement (OSMOLITE 1.5 CAL) 1,000 mL (07/11/21 0237)   iron sucrose 100 mg (07/10/21 1711)    apixaban  2.5 mg Per Tube BID   atorvastatin  40 mg Per Tube Daily   chlorhexidine  15  mL Mouth Rinse BID   Chlorhexidine Gluconate Cloth  6 each Topical Daily   darbepoetin (ARANESP) injection - DIALYSIS  200 mcg Intravenous Q Thu-HD   feeding supplement (PROSource TF)  45 mL Per Tube Daily   gabapentin  100 mg Per Tube Q12H   insulin aspart  0-6 Units Subcutaneous Q4H   lactulose  20 g Per Tube Daily   mouth rinse  15 mL Mouth Rinse q12n4p   metaxalone  400 mg Per Tube TID   midodrine  10 mg Per Tube TID WC   mirtazapine  7.5 mg Per Tube QHS   multivitamin  1 tablet Per Tube QHS   pantoprazole sodium  40 mg Per Tube Daily   sodium chloride flush  10-40 mL Intracatheter Q12H   sodium chloride flush  3 mL Intravenous Q12H   thiamine  100 mg Per Tube Daily   venlafaxine  37.5 mg Per Tube BID    Dialysis Orders: AF MWF  3h 46mn 400/500    62.5kg    3K/2.5 Ca    P2  AVG HeRO   Hep none - Mircera 50 q 2 wks  (7/13)  Venofer 50 q wk - Parsabiv '5mg'$  IV q  HD  Assessment/Plan: AMS/FTT: Mental status is improving and she is tolerating being in chair during HD. HD done  in the bed d/t restraints on 9/13. Pt will have to be able to sit in a chair for 4-6 hrs without restraints to qualify for OP HD w/ our group.  If  she continues to tolerate sitting in the chair for dialysis and cooperate, then will re-CLIP the patient as her OP HD unit will not take her back. HD Nurse Coordinator and Renal Navigator following, stated prior to re-clipping her, we need to ensure patient nutritional status is stable.  Melena/Rectal bleeding - GI consulted. Hx rectal ulcers.  EGD 7/27 normal, flex sig with mucosal ulceration c/w sterocoral ulcers, non bleeding. Hgb 8s, stable. ESRD: HD TTS while here. Next HD 9/17 Dysphagia -  Patient refusing G-tube placement.  Access Issues/AVG clotted: Hadf'gram  6/23 which showed chronic thrombus and was started on Eliquis. AVG clotted 9/2, unable to be declotted d/t arm contracture. S/P conversion to tunneled HD catheter 9/7 via IR-on Heparin drip, appreciate their assistance.  Hypertension/volume: BP stable, on midodrine '10mg'$  TID with significant overload on exam. Net UF Need to ^ UFG with HD as tolerated, can give IV albumin prn.   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  Getting TFs via Core Track-RD managing tube. Refuses G-tube. 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 9. Psych eval 9/14:  It is determined that patient has functional mental capacity for medical decision-making.  10. Anemia: Continue Aranesp-dose increase to 200 mcg q week on 9/15   11. Dispo: DNR/DNI. Poor prognosis. Palliative care has seen see #1. Appreciate their involvement. Although patient is currently tolerating being in the chair for HD, we do not believe dialysis is adding to her quality of life. Per family communication, family wishes to continue. Patient is refusing G-tube placement which places another barrier [facility] Ongoing discussions with multi-discipline team-continue CoreTrack with tube feedings.  [facility] 07/11/2021,10:28 AM    Seen and examined independently.  Agree with note and exam as documented above by physician extender and as noted here.  General elderly female in bed in no acute distress HEENT normocephalic  atraumatic; nasal feeding tube  Neck supple trachea midline Lungs clear to auscultation bilaterally normal work of breathing at rest  Heart S1S2 no rub Abdomen soft nontender nondistended Extremities 1+ lower extremity edema; left arm contracture Psych no anxiety or agitation  Neuro - year is "2020" per pt; oriented to person and does not know location Left IJ tunn catheter  ESRD for HD per TTS schedule for now; palliative care involved and poor prognosis.  Must do HD in chair to be able to get outpatient HD and would not be able to be in restraints - wasn't able to have HD in chair Tuesday due to restraints - would avoid skelaxin in ESRD patient - contacted primary team  - renal panel    Dysphagia - per primary.  See pt refusing G tube and has tube feeds through core track currently (and cannot be discharged with same).  Must have a means of getting adequate nutrition prior to consideration of re-clipping outpatient    Anemia and CKD - on ESA. Aranesp was increased to 200 mcg weekly on 9/15   Hyponatremia - no labs today yet. Renal panel ordered.  S/p hypotonic fluids and now off of dextrose. Also s/p HD  Chronic hypotension - on midodrine   Appreciate primary team and palliative     Claudia Desanctis, MD 07/11/2021  11:01 AM

## 2021-07-11 NOTE — Progress Notes (Addendum)
CSW spoke with Juliann Pulse at [facility] who states the patient was at the facility for short term rehab and that patient's son did not complete Medicaid application for long term care. Juliann Pulse states the facility can accept the patient back for additional STR.  CSW spoke with patient at bedside - patient is agreeable to return to [facility] for additional rehab once she is medically cleared for discharge. Patient has a cortrak and mitt to prevent her from removing the tube. Patient's lunch was at bedside and CSW encouraged her to eat so that she could get the tube removed and go to rehab - she mumbled an incomprehensible response. Patient confirmed she had sat upright in the chair for dialysis during her most recent session.  CSW spoke with Renal Navigator regarding patient - patient's outpatient clinic is [facility].  CSW spoke with MD to request a calorie counting order be placed due to patient refusing to eat.  Madilyn Fireman, MSW, LCSW Transitions of Care  Clinical Social Worker II [phone]

## 2021-07-11 NOTE — NC FL2 (Signed)
[place] MEDICAID FL2 LEVEL OF CARE SCREENING TOOL     IDENTIFICATION  Patient Name: Amy [name] Birthdate: November 25, 1945 Sex: female Admission Date (Current Location): 05/19/2021  [facility] and [place] Number:  Herbalist and Address:  [facility], [place] [address] [place], [place] [address]      Provider Number: [id number]  Attending Physician Name and Address:  Antonieta Pert, MD  Relative Name and Phone Number:  Jc Yarwood - son; [phone]    Current Level of Care: Hospital Recommended Level of Care: [place] Prior Approval Number:    Date Approved/Denied:   PASRR Number: [id number]  Discharge Plan: SNF    Current Diagnoses: Patient Active Problem List   Diagnosis Date Noted   Fever    Clogged feeding tube    Acute GI bleeding    Oropharyngeal dysphagia    Failure to thrive in adult    Inadequate oral intake    Cerebral thrombosis with cerebral infarction 05/28/2021   Protein-calorie malnutrition, severe 05/28/2021   Hypokalemia 05/19/2021   GI bleed 05/19/2021   Stage 2 skin ulcer of sacral region ([place]) 05/19/2021   CVA (cerebral vascular accident) ([place]) 04/21/2021   Lower GI bleed 04/21/2021   Gastritis 03/11/2021   Elevated glucose 03/11/2021   Hospital discharge follow-up 02/06/2021   Swelling of face 01/27/2021   Facial swelling 01/27/2021   Neck swelling 01/10/2021   Dyslipidemia 01/09/2021   Angio-edema 01/08/2021   Primary osteoarthritis of right knee 12/10/2020   Diarrhea, unspecified 07/03/2020   Pain, unspecified 07/03/2020   Unspecified disturbances of skin sensation 07/03/2020   Type 2 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, with long-term current use of insulin ([place]) 05/23/2020   Slow transit constipation 05/09/2020   Drug-induced constipation 05/09/2020   Acidosis 05/01/2020   Disorder of lipoprotein metabolism, 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   Encounter for  adequacy testing for peritoneal dialysis ([place]) 05/01/2020   Liver disease, 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   Other dietary vitamin B12 deficiency anemia 05/01/2020   Other disorders of electrolyte and fluid balance, not elsewhere classified 05/01/2020   Other disorders resulting from impaired renal tubular function 05/01/2020   Other long term (current) drug therapy 05/01/2020   Essential hypertension 01/16/2020   Bilateral carpal tunnel syndrome 01/16/2020   Abnormality of albumin 12/07/2019   Allergy, unspecified, initial encounter 11/27/2019   Mild protein-calorie malnutrition ([place]) 09/25/2019   Iron deficiency anemia, unspecified 09/22/2019   Coagulation defect, unspecified ([place]) 09/19/2019   Dependence on renal dialysis ([place]) 09/19/2019   ESRD on dialysis ([place]) 09/19/2019   Shortness of breath 09/19/2019   Hyperkalemia 07/20/2019   Hypocalcemia [id number]   Metabolic acidosis, increased anion gap 07/20/2019   Nausea and vomiting 07/20/2019   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   Diabetic neuropathy ([place]) 04/15/2016   Hematuria 04/15/2016   Proteinuria due to type 2 diabetes mellitus ([place]) 04/15/2016   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 01/29/2016    Orientation RESPIRATION BLADDER Height & Weight     Self, Place, Situation  Normal Continent Weight:  (siiting on HD chair) Height:  '5\' 7"'$  (170.2 cm)  BEHAVIORAL SYMPTOMS/MOOD NEUROLOGICAL BOWEL NUTRITION STATUS      Continent Diet, Feeding tube (Cortrak - adamantly refusing PEG)  AMBULATORY STATUS COMMUNICATION OF NEEDS Skin   Extensive Assist Verbally Normal                       Personal Care Assistance Level  of Assistance  Bathing, Dressing, Feeding Bathing Assistance: Maximum assistance Feeding assistance: Independent Dressing Assistance: Limited assistance     Functional Limitations Info  Sight, Speech, Hearing Sight Info: Adequate Hearing Info: Adequate Speech Info: Adequate     SPECIAL CARE FACTORS FREQUENCY  PT (By licensed PT), OT (By licensed OT)     PT Frequency: 5x weekly OT Frequency: 5x weekly            Contractures Contractures Info: Not present    Additional Factors Info  Code Status, Allergies Code Status Info: DNR Allergies Info: Sensipar, Statins           Current Medications (07/11/2021):  This is the current hospital active medication list Current Facility-Administered Medications  Medication Dose Route Frequency Provider Last Rate Last Admin   0.9 %  sodium chloride infusion  250 mL Intravenous PRN Orma Flaming, MD       acetaminophen (TYLENOL) tablet 650 mg  650 mg Per Tube Q6H PRN Shawna Clamp, MD   650 mg at 07/03/21 1715   apixaban (ELIQUIS) tablet 2.5 mg  2.5 mg Per Tube BID Ardyth Harps B, RPH   2.5 mg at 07/11/21 0847   atorvastatin (LIPITOR) tablet 40 mg  40 mg Per Tube Daily Shawna Clamp, MD   40 mg at 07/11/21 0847   chlorhexidine (PERIDEX) 0.12 % solution 15 mL  15 mL Mouth Rinse BID Kc, Maren Beach, MD   15 mL at 07/11/21 0845   Chlorhexidine Gluconate Cloth 2 % PADS 6 each  6 each Topical Daily Dahal, Marlowe Aschoff, MD   6 each at 07/11/21 0848   Darbepoetin Alfa (ARANESP) injection 200 mcg  200 mcg Intravenous Q Thu-HD Tobie Poet E, NP   200 mcg at 07/10/21 1637   feeding supplement (OSMOLITE 1.5 CAL) liquid 1,000 mL  1,000 mL Per Tube Continuous Antonieta Pert, MD 50 mL/hr at 07/11/21 0237 1,000 mL at 07/11/21 0237   feeding supplement (PROSource TF) liquid 45 mL  45 mL Per Tube Daily Alekh, Kshitiz, MD   45 mL at 07/11/21 0846   gabapentin (NEURONTIN) 250 MG/5ML solution 100 mg  100 mg Per Tube Q12H Samella Parr, NP   100 mg at 07/11/21 0852   insulin aspart (novoLOG) injection 0-6 Units  0-6 Units Subcutaneous Q4H Samella Parr, NP   1 Units at 07/11/21 0441   iohexol (OMNIPAQUE) 300 MG/ML solution 50 mL  50 mL Per Tube Once PRN Samella Parr, NP       iron sucrose (VENOFER) 100 mg in sodium chloride  0.9 % 100 mL IVPB  100 mg Intravenous Q Ricki Miller, MD 420 mL/hr at 07/10/21 1711 100 mg at 07/10/21 1711   lactulose (CHRONULAC) 10 GM/15ML solution 20 g  20 g Per Tube Daily Paytes, Austin A, RPH   20 g at 07/11/21 0845   MEDLINE mouth rinse  15 mL Mouth Rinse q12n4p Kc, Ramesh, MD   15 mL at 07/10/21 1209   metaxalone (SKELAXIN) tablet 400 mg  400 mg Per Tube TID Paytes, Austin A, RPH   400 mg at 07/11/21 0846   midodrine (PROAMATINE) tablet 10 mg  10 mg Per Tube TID WC Shawna Clamp, MD   10 mg at 07/11/21 0846   mirtazapine (REMERON) tablet 7.5 mg  7.5 mg Per Tube QHS Shawna Clamp, MD   7.5 mg at 07/10/21 2215   multivitamin (RENA-VIT) tablet 1 tablet  1 tablet Per Tube QHS Shawna Clamp, MD  1 tablet at 07/10/21 2214   ondansetron (ZOFRAN) injection 4 mg  4 mg Intravenous Q8H PRN Zierle-Ghosh, Asia B, DO   4 mg at 07/02/21 0111   oxyCODONE (ROXICODONE INTENSOL) 20 MG/ML concentrated solution 5 mg  5 mg Oral Q6H PRN Knox Royalty, NP   5 mg at 07/11/21 0846   pantoprazole sodium (PROTONIX) 40 mg/20 mL oral suspension 40 mg  40 mg Per Tube Daily Kc, Ramesh, MD   40 mg at 07/11/21 0845   sodium chloride flush (NS) 0.9 % injection 10-40 mL  10-40 mL Intracatheter Q12H Dana Allan I, MD   10 mL at 07/10/21 0957   sodium chloride flush (NS) 0.9 % injection 3 mL  3 mL Intravenous Q12H Orma Flaming, MD   3 mL at 07/10/21 2229   sodium chloride flush (NS) 0.9 % injection 3 mL  3 mL Intravenous PRN Orma Flaming, MD   3 mL at 07/09/21 0954   thiamine tablet 100 mg  100 mg Per Tube Daily Shawna Clamp, MD   100 mg at 07/11/21 0846   venlafaxine (EFFEXOR) tablet 37.5 mg  37.5 mg Per Tube BID Donnamae Jude, RPH   37.5 mg at 07/11/21 [id number]     Discharge Medications: Please see discharge summary for a list of discharge medications.  Relevant Imaging Results:  Relevant Lab Results:   Additional Information SSN: [id number] Dialysis MWF  Archie Endo, LCSW

## 2021-07-11 NOTE — Progress Notes (Signed)
Nutrition Follow-up  DOCUMENTATION CODES:  Severe malnutrition in context of chronic illness  INTERVENTION:  -48 hour calorie count per MD, RD will follow-up with results on Monday, 9/19 -Ensure Enlive po BID, each supplement provides 350 kcal and 20 grams of protein  Continue TF via Cortrak: -Osmolite 1.5 @ 58m/hr (12072md)  -4570mrosource TF daily  Provides 1840 kcals, 86g protein, 914m67mee water  NUTRITION DIAGNOSIS:  Severe Malnutrition related to chronic illness (ESRD on HD, prior CVA) as evidenced by percent weight loss, severe muscle depletion, energy intake < or equal to 75% for > or equal to 1 month. 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  8/17 - cortrak placed (gastric tip) 8/19 - cortrak replaced (gastric tip) 8/29 - cortrak replaced (gastric tip) 9/07 - s/p tunneled HD cath placement 9/12 - cortrak replaced (gastric tip confirmed via xray); diet advanced to regular  Psychiatry evaluated pt and determined pt is able to make decisions for herself. Pt has since stated that she does not want G-tube placed. Pt continues to refuse POs despite ability to safely swallow -- reports she dislikes the food here. Discussed pt with MD who would like calorie count to be conducted over the weekend. Will not transition pt to nocturnal feeds given anticipated continued refusal of POs.   Current regimen: Osmolite 1.5 @ 50ml31m(1200ml/25m/ 45ml P5murce TF daily   PO intake: 0% x last 8 recorded meals   Medications: Scheduled Meds:  apixaban  2.5 mg Per  Tube BID   atorvastatin  40 mg Per Tube Daily   chlorhexidine  15 mL Mouth Rinse BID   Chlorhexidine Gluconate Cloth  6 each Topical Daily   darbepoetin (ARANESP) injection - DIALYSIS  200 mcg Intravenous Q Thu-HD   feeding supplement (PROSource TF)  45 mL Per Tube Daily   gabapentin  100 mg Per Tube Q12H   insulin aspart  0-6 Units Subcutaneous Q4H   lactulose  20 g Per Tube Daily   mouth rinse  15 mL Mouth Rinse q12n4p   midodrine  10 mg Per Tube TID WC   mirtazapine  7.5 mg Per Tube QHS   multivitamin  1 tablet Per Tube QHS   pantoprazole sodium  40 mg Per Tube Daily   sodium chloride flush  10-40 mL Intracatheter Q12H   sodium chloride flush  3 mL Intravenous Q12H   thiamine  100 mg Per Tube Daily   venlafaxine  37.5 mg Per Tube BID  Continuous Infusions:  sodium chloride     feeding supplement (OSMOLITE 1.5 CAL) 1,000 mL (07/11/21 0237)   iron sucrose 100 mg (07/10/21 1711)   Labs: Recent Labs  Lab 07/09/21 0351 07/10/21 1532 07/11/21 1123  NA 131* 125* 131*  K 4.3 5.0 4.4  CL 95* 89* 94*  CO2 27 26 30   BUN 16 28* 18  CREATININE 2.61* 3.32* 2.40*  CALCIUM 8.3* 7.9* 8.3*  PHOS 2.4* 2.2* 1.9*  GLUCOSE 204* 165* 129*  CBGs 107-175 x24 hours  Last HD 9/15,  3.3L net UF No UOP documented x24 hours I/O: +5.9L since admit  RN edema assessment notes mild pitting edema to BUE and non-pitting edema to BLE.   Diet Order:   Diet Order             Diet regular Room service appropriate? Yes; Fluid consistency: Thin  Diet effective now                  EDUCATION NEEDS:  Education needs have been addressed  Skin:  Skin Assessment: Skin Integrity Issues: Skin Integrity Issues:: Stage II Stage II: Pressure Injury on coccyx; Open wound on buttocks  Last BM:  9/15  Height:  Ht Readings from Last 1 Encounters:  05/20/21 5' 7"  (1.702 m)   Weight:  Wt Readings from Last 1 Encounters:  07/10/21 100.9 kg   BMI:  Body mass index is 34.84 kg/m.  Estimated  Nutritional Needs:  Kcal:  1700-1900 Protein:  85-100 grams Fluid:  1L +UOP    Larkin Ina, MS, RD, LDN (she/her/hers) RD pager number and weekend/on-call pager number located in [place].

## 2021-07-11 NOTE — Progress Notes (Signed)
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  She had altered mental status seen by neurology MRI similar to prior MRI at [facility] with a large right MCA stroke, seen by nephrology for dialysis need.  Patient having ongoing confusion weakness inability to sit in dialysis recliner and so not accepted back to outpatient dialysis at this time and awaiting further improvement.  She has had poor intake failure to thrive family wanted PEG tube I had tried but was unable to place because of overlying transverse colon and has been on core track NG tube feeding.  Seen by surgery for G-tube placement.  Core track was clogged and changed 07/07/21. 9/13-psych consulted for competency and patient does have functional mental capacity for medical decision,refused G tube.    Subjective: Seen this morning.  Patient is alert awake No new complaints. Denies any pain.  Assessment & Plan:  Acute on chronic lower GI bleeding from stercoral ulcer Anemia of chronic renal disease:Status post EGD and flex sigmoidoscopy by GI.  Stable hemoglobin.  Monitor intermittently. 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  Can continue Eliquis, Lipitor.  On the muscle relaxant metaxalone per nephrology discontinue. .  Acute encephalopathy in the setting of recent stroke  Failure to thrive/generalized deconditioning, poor oral intake Hypoglycemia-resolved off D10. Dysphagia ruled out speech eval 8/25 patient was able to eat well but unwilling to take po Neurology was consulted for altered mental status, MRI brain similar to prior MRI at [facility] with a large right MCA stroke. Cont to augment diet with core track, dietitian following.  Family/son wanting G tube placement but patient has declined/refused, seen by psychiatry 07/08/21, and she is able to make decision.  Patient reports she does not like the food, continue dysphagia 3 diet. On venlafaxine, bedtime Remeron   ESRD on HD MWF on outpatient currently on TTS, TDC placed 9/7. continue as per nephrology outpatient HD center unable to accept the patient unless she is able to do 4 to 6 hours of dialysis on chair.  Continue HD on chair, nephrology following.  Nephrology suggested possible hospice  but pt and family wanting to continue with care.   Hypokalemia/hypophosphatemia per HD.   Constipation continue laxatives for BM.   Hypertension/Hypotension:BP currently stable, cont on midodrine   Type 2 diabetes mellitus with episodes of hypoglycemia A1c 5.1, diet controlled. continue to monitor blood glucose while on TF.  Off dextrose. Recent Labs  Lab 07/10/21 1949 07/10/21 2338 07/11/21  [id number] 07/11/21 0729 07/11/21 1135  GLUCAP 137* 173* 175* 107* 131*     Chronic thrombus left upper extremity continue on current Eliquis.  Was on heaprin while cortrak was clogged.   GERD on PPI   Stage II mid coccyx  pressure injury POA continue wound care  Goals of care currently DNR family son is available over the phone but at times difficult to reach.  Palliative care nephrology following.  Seen by psychiatry.  That patient able to make medical decision.  Overall poor quality of life.  Son out of town and coming back on 07/14/21.  Diet Order             Diet regular Room service appropriate? Yes; Fluid consistency: Thin  Diet effective now                   Nutrition Problem: Severe Malnutrition Etiology: chronic illness (ESRD on HD, prior CVA) Signs/Symptoms: percent weight loss, severe muscle depletion, energy intake < or equal to 75% for > or equal to 1 month Percent weight loss: 16.4 % Interventions: Nepro shake, Magic cup, MVI Patient's Body mass index is 34.84 kg/m.  Pressure Injury 05/20/21 Coccyx Mid Stage 2 -  Partial thickness loss of dermis presenting as a shallow open injury with a red, pink wound bed without slough. 1X0.5X0 (Active)  05/20/21 1315  Location: Coccyx  Location Orientation: Mid  Staging: Stage 2 -  Partial thickness loss of dermis presenting as a shallow open injury with a red, pink wound bed without slough.  Wound Description (Comments): 1X0.5X0  Present on Admission: Yes    DVT prophylaxis: apixaban (ELIQUIS) tablet 2.5 mg Start: 07/09/21 1000 Place TED hose Start: 05/19/21 1706 Code Status:   Code Status: DNR  Family Communication: plan of care discussed with patient at bedside.  Discussed in the multidisciplinary rounds.  Son was updated by the team 9/13, is out of town and coming back in 9/19 Status is: Inpatient  Remains inpatient appropriate because:Inpatient level of care appropriate due to severity of illness  Dispo: The patient is from: Home              Anticipated d/c is to: SNF              Patient currently is not medically stable   Difficult to place patient Yes Unresulted Labs (From admission, onward)     Start     Ordered   07/11/21  1057  Renal function panel  Once,   R       Question:  Specimen collection method  Answer:  Lab=Lab collect   07/11/21 1056            Medications reviewed:  Scheduled Meds:  apixaban  2.5 mg Per Tube BID   atorvastatin  40 mg Per Tube Daily   chlorhexidine  15 mL Mouth Rinse BID   Chlorhexidine Gluconate Cloth  6 each Topical Daily   darbepoetin (ARANESP) injection - DIALYSIS  200 mcg Intravenous Q Thu-HD   feeding supplement (PROSource TF)  45 mL Per Tube Daily   gabapentin  100 mg Per Tube Q12H   insulin aspart  0-6 Units Subcutaneous Q4H   lactulose  20 g Per Tube Daily   mouth rinse  15 mL Mouth Rinse q12n4p   metaxalone  400 mg Per Tube TID   midodrine  10 mg Per Tube TID WC   mirtazapine  7.5 mg Per Tube QHS   multivitamin  1  tablet Per Tube QHS   pantoprazole sodium  40 mg Per Tube Daily   sodium chloride flush  10-40 mL Intracatheter Q12H   sodium chloride flush  3 mL Intravenous Q12H   thiamine  100 mg Per Tube Daily   venlafaxine  37.5 mg Per Tube BID   Continuous Infusions:  sodium chloride     feeding supplement (OSMOLITE 1.5 CAL) 1,000 mL (07/11/21 0237)   iron sucrose 100 mg (07/10/21 1711)   Consultants:see note  Procedures:see note Antimicrobials: Anti-infectives (From admission, onward)    Start     Dose/Rate Route Frequency Ordered Stop   07/02/21 1116  ceFAZolin (ANCEF) IVPB 2g/100 mL premix        over 30 Minutes Intravenous Continuous PRN 07/02/21 1139 07/02/21 1116   07/02/21 1111  ceFAZolin (ANCEF) 2-4 GM/100ML-% IVPB       Note to Pharmacy: Domenick Bookbinder   : cabinet override      07/02/21 1111 07/02/21 2314   07/01/21 1215  ceFAZolin (ANCEF) IVPB 2g/100 mL premix        2 g 200 mL/hr over 30 Minutes Intravenous On call 07/01/21 0902 07/02/21 1215   05/29/21 1622  ceFAZolin (ANCEF) 2-4 GM/100ML-% IVPB       Note to Pharmacy: Lytle Butte   : cabinet override      05/29/21 1622 05/30/21 0429   05/29/21 0000  cefTRIAXone (ROCEPHIN) 1 g in  sodium chloride 0.9 % 100 mL IVPB  Status:  Discontinued        1 g 200 mL/hr over 30 Minutes Intravenous Every 24 hours 05/28/21 1801 05/28/21 1801   05/28/21 1830  cefTRIAXone (ROCEPHIN) 1 g in sodium chloride 0.9 % 100 mL IVPB        1 g 200 mL/hr over 30 Minutes Intravenous Every 24 hours 05/28/21 1801 05/30/21 1907   05/28/21 1630  ceFAZolin (ANCEF) IVPB 2g/100 mL premix        2 g 200 mL/hr over 30 Minutes Intravenous To Radiology 05/28/21 1537 05/29/21 1630      Culture/Microbiology    Component Value Date/Time   SDES BLOOD RIGHT ANTECUBITAL 06/26/2021 1234   SPECREQUEST  06/26/2021 1234    BOTTLES DRAWN AEROBIC AND ANAEROBIC Blood Culture adequate volume   CULT  06/26/2021 1234    NO GROWTH 5 DAYS Performed at [facility], [place] [address] [place], [place] [address]    REPTSTATUS 07/01/2021 FINAL 06/26/2021 1234    Other culture-see note  Objective: Vitals: Today's Vitals   07/10/21 2022 07/11/21 0445 07/11/21 0931 07/11/21 0938  BP: (!) 109/57 118/71  (!) 138/52  Pulse: (!) 110 (!) 105  94  Resp: '20 19  14  '$ Temp: 98.6 F (37 C) 98.8 F (37.1 C)  98.4 F (36.9 C)  TempSrc:      SpO2: 100% 100%  100%  Weight:      Height:      PainSc:   Asleep     Intake/Output Summary (Last 24 hours) at 07/11/2021 1241 Last data filed at 07/11/2021 0700 Gross per 24 hour  Intake 1020 ml  Output 3300 ml  Net -2280 ml    Filed Weights   07/08/21 0850 07/08/21 1250 07/10/21 1510  Weight: 102.1 kg 98 kg 100.9 kg   Weight change:   Intake/Output from previous day: 09/15 0701 - 09/16 0700 In: 3156.1 [P.O.:120; I.V.:1036.1; NG/GT:2000] Out: 3300  Intake/Output this shift: No intake/output data recorded. [place] Weights   07/08/21 0850  07/08/21 1250 07/10/21 1510  Weight: 102.1 kg 98 kg 100.9 kg   Examination: General exam: Alert, awake on RA, Older than stated age, weak appearing. HEENT:Oral mucosa moist, Ear/Nose WNL grossly, dentition normal. Respiratory  system: bilaterally diminished, no use of accessory muscle Cardiovascular system: S1 & S2 +, No JVD,. Gastrointestinal system: Abdomen soft, NT,ND, BS+ Nervous System:Alert, awake, spastic paralysis in the left side Extremities: Edema  on b/l UE edema, distal peripheral pulses palpable.  Skin: No rashes,no icterus. MSK: Normal muscle bulk,tone, power .  Data Reviewed: I have personally reviewed following labs and imaging studies CBC: Recent Labs  Lab 07/07/21 0027 07/07/21 1411 07/08/21 0422 07/09/21 0351 07/10/21 1532  WBC 10.0 8.2 9.3 9.5 12.5*  HGB 8.9* 8.5* 8.9* 8.6* 8.3*  HCT 27.9* 27.1* 28.2* 27.9* 26.8*  MCV 92.1 94.1 93.7 95.2 95.7  PLT 241 262 265 186 [id number]    Basic Metabolic Panel: Recent Labs  Lab 07/07/21 0027 07/09/21 0351 07/10/21 1532  NA 131* 131* 125*  K 4.0 4.3 5.0  CL 96* 95* 89*  CO2 '23 27 26  '$ GLUCOSE 73 204* 165*  BUN 20 16 28*  CREATININE 3.17* 2.61* 3.32*  CALCIUM 8.2* 8.3* 7.9*  PHOS  --  2.4* 2.2*    GFR: Estimated Creatinine Clearance: 18.4 mL/min (A) (by C-G formula based on SCr of 3.32 mg/dL (H)). Liver Function Tests: Recent Labs  Lab 07/07/21 0027 07/09/21 0351 07/10/21 1532  AST 28  --   --   ALT 6  --   --   ALKPHOS 90  --   --   BILITOT 0.4  --   --   PROT 5.8*  --   --   ALBUMIN 1.8* 2.2* 1.9*    No results for input(s): LIPASE, AMYLASE in the last 168 hours. No results for input(s): AMMONIA in the last 168 hours. Coagulation Profile: No results for input(s): INR, PROTIME in the last 168 hours. Cardiac Enzymes: No results for input(s): CKTOTAL, CKMB, CKMBINDEX, TROPONINI in the last 168 hours. BNP (last 3 results) No results for input(s): PROBNP in the last 8760 hours. HbA1C: No results for input(s): HGBA1C in the last 72 hours. CBG: Recent Labs  Lab 07/10/21 1949 07/10/21 2338 07/11/21 0434 07/11/21 0729 07/11/21 1135  GLUCAP 137* 173* 175* 107* 131*    Lipid Profile: No results for input(s): CHOL, HDL,  LDLCALC, TRIG, CHOLHDL, LDLDIRECT in the last 72 hours. Thyroid Function Tests: No results for input(s): TSH, T4TOTAL, FREET4, T3FREE, THYROIDAB in the last 72 hours. Anemia Panel: No results for input(s): VITAMINB12, FOLATE, FERRITIN, TIBC, IRON, RETICCTPCT in the last 72 hours. Sepsis Labs: No results for input(s): PROCALCITON, LATICACIDVEN in the last 168 hours.  No results found for this or any previous visit (from the past 240 hour(s)).     Radiology Studies: No results found.   LOS: 10 days   Antonieta Pert, MD [facility]  07/11/2021, 12:41 PM

## 2021-07-12 DIAGNOSIS — D638 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: Secondary | ICD-10-CM | POA: Diagnosis not present

## 2021-07-12 DIAGNOSIS — K922 Gastrointestinal hemorrhage, unspecified: Secondary | ICD-10-CM | POA: Diagnosis not present

## 2021-07-12 LAB — GLUCOSE, CAPILLARY
Glucose-Capillary: 123 mg/dL — ABNORMAL HIGH (ref 70–99)
Glucose-Capillary: 158 mg/dL — ABNORMAL HIGH (ref 70–99)
Glucose-Capillary: 158 mg/dL — ABNORMAL HIGH (ref 70–99)
Glucose-Capillary: 158 mg/dL — ABNORMAL HIGH (ref 70–99)
Glucose-Capillary: 169 mg/dL — ABNORMAL HIGH (ref 70–99)
Glucose-Capillary: 79 mg/dL (ref 70–99)

## 2021-07-12 MED ORDER — LIDOCAINE HCL (PF) 1 % IJ SOLN: 5.0000 mL | INTRAMUSCULAR | Status: DC | PRN

## 2021-07-12 MED ORDER — HEPARIN SODIUM (PORCINE) 1000 UNIT/ML DIALYSIS
1000.0000 [IU] | INTRAMUSCULAR | Status: DC | PRN
  Administered 2021-07-12: 1000 [IU] via INTRAVENOUS_CENTRAL

## 2021-07-12 MED ORDER — LIDOCAINE-PRILOCAINE 2.5-2.5 % EX CREA: 1.0000 "application " | TOPICAL_CREAM | CUTANEOUS | Status: DC | PRN

## 2021-07-12 MED ORDER — ALTEPLASE 2 MG IJ SOLR: 2.0000 mg | Freq: Once | INTRAMUSCULAR | Status: DC | PRN

## 2021-07-12 MED ORDER — MORPHINE SULFATE (PF) 2 MG/ML IV SOLN: 1.0000 mg | Freq: Once | INTRAVENOUS | Status: DC

## 2021-07-12 MED ORDER — SODIUM CHLORIDE 0.9 % IV SOLN: 100.0000 mL | INTRAVENOUS | Status: DC | PRN

## 2021-07-12 MED ORDER — HEPARIN SODIUM (PORCINE) 1000 UNIT/ML DIALYSIS
20.0000 [IU]/kg | INTRAMUSCULAR | Status: DC | PRN
  Filled 2021-07-12: qty 2

## 2021-07-12 MED ORDER — PENTAFLUOROPROP-TETRAFLUOROETH EX AERO: 1.0000 "application " | INHALATION_SPRAY | CUTANEOUS | Status: DC | PRN

## 2021-07-12 NOTE — Progress Notes (Signed)
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  She had altered mental status seen by neurology MRI similar to prior MRI at [facility] with a large right MCA stroke, seen by nephrology for dialysis need.  Patient having ongoing confusion weakness inability to sit in dialysis recliner and so not accepted back to outpatient dialysis at this time and awaiting further improvement.  She has had poor intake failure to thrive family wanted PEG tube I had tried but was unable to place because of overlying transverse colon and has been on core track NG tube feeding.  Seen by surgery for G-tube placement.  Core track was clogged and changed 07/07/21. 9/13-psych consulted for competency and patient does have functional mental capacity for medical decision,refused G tube.    Subjective: No acute events overnight.  No new complaints.  Patient is alert awake answers questions, pleasantly confused oriented to place current president. " I we will only eat the food brought from home"  Assessment & Plan:  Acute on chronic lower GI bleeding from stercoral ulcer Anemia of chronic renal disease:S/P EGD and flex sigmoidoscopy by GI.  Stable hemoglobin.  Monitor intermittently. 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 Cont PT OT and supportive care. Can continue Eliquis, Lipitor. On the muscle relaxant metaxalone per nephrology discontinued.  Acute encephalopathy in the setting of recent stroke  Failure to thrive/generalized deconditioning, poor oral intake Hypoglycemia-resolved off D10. Dysphagia ruled out speech eval 8/25 patient was able to eat well but unwilling to take po Neurology was consulted for altered mental status, MRI brain similar to prior MRI at [facility] with a large right MCA stroke. Cont to augment diet with core track, dietitian following.  Family/son wanting G tube placement but patient has declined/refused, seen by psychiatry 07/08/21, and she is able to make decision.  Patient reports she does not like the food, continue dysphagia 3 diet.  On calorie count  x48 hrs since 9/16. cont on venlafaxine, bedtime Remeron   ESRD on HD MWF on outpatient currently on TTS, TDC placed 9/7. continue HD today as per nephrology ,outpatient HD center unable to accept the patient unless she is able to do 4 to 6 hours of dialysis on chair.  Continue HD on chair, nephrology following.  Nephrology suggested possible hospice  but pt and family wanting to continue with care.   Hypokalemia/hypophosphatemia per HD.   Constipation continue laxatives for BM.   Hypertension/Hypotension:BP currently stable, cont on midodrine   Type 2 diabetes mellitus  with episodes of hypoglycemia A1c 5.1, diet controlled. continue to monitor blood glucose while on TF.  Off dextrose. Recent Labs  Lab 07/11/21 1657 07/11/21 2017 07/12/21 0046 07/12/21 0413 07/12/21 0722  GLUCAP 139* 137* 169* 158* 79      Chronic thrombus left upper extremity continue on current Eliquis.  Was on heaprin while cortrak was clogged.   GERD on PPI   Stage II mid coccyx pressure injury POA continue wound care  Goals of care currently DNR family son is available over the phone but at times difficult to reach.  Palliative care nephrology following.  Seen by psychiatry.  That patient able to make medical decision.  Overall poor quality of life.  Son out of town and coming back on 07/14/21.  Diet Order             Diet regular Room service appropriate? Yes; Fluid consistency: Thin  Diet effective now                   Nutrition Problem: Severe Malnutrition Etiology: chronic illness (ESRD on HD, prior CVA) Signs/Symptoms: percent weight loss, severe muscle depletion, energy intake < or equal to 75% for > or equal to 1 month Percent weight loss: 16.4 % Interventions: Nepro shake, Magic cup, MVI Patient's Body mass index is 34.84 kg/m.  Pressure Injury 05/20/21 Coccyx Mid Stage 2 -  Partial thickness loss of dermis presenting as a shallow open injury with a red, pink wound bed without slough. 1X0.5X0 (Active)  05/20/21 1315  Location: Coccyx  Location Orientation: Mid  Staging: Stage 2 -  Partial thickness loss of dermis presenting as a shallow open injury with a red, pink wound bed without slough.  Wound Description (Comments): 1X0.5X0  Present on Admission: Yes    DVT prophylaxis: apixaban (ELIQUIS) tablet 2.5 mg Start: 07/09/21 1000 Place TED hose Start: 05/19/21 1706 Code Status:   Code Status: DNR  Family Communication: plan of care discussed with patient at bedside.  Discussed in the multidisciplinary rounds.  Son was updated by the team 9/13, is out of town and coming back in 9/19 Status is: Inpatient  Remains inpatient appropriate because:Inpatient level of care appropriate due to severity of illness  Dispo: The patient is from: Home              Anticipated d/c is to: SNF               Patient currently is not medically stable   Difficult to place patient Yes Unresulted Labs (From admission, onward)    None       Medications reviewed:  Scheduled Meds:  apixaban  2.5 mg Per Tube BID   atorvastatin  40 mg Per Tube Daily   chlorhexidine  15 mL Mouth Rinse BID   Chlorhexidine Gluconate Cloth  6 each Topical Daily   darbepoetin (ARANESP) injection - DIALYSIS  200 mcg Intravenous Q Thu-HD   feeding supplement  237 mL Oral BID BM   feeding supplement (PROSource TF)  45 mL Per Tube Daily   gabapentin  100 mg Per Tube Q12H   insulin aspart  0-6 Units Subcutaneous Q4H   lactulose  20 g Per Tube Daily   mouth rinse  15 mL Mouth Rinse q12n4p   midodrine  10 mg Per Tube TID WC   mirtazapine  7.5 mg Per Tube QHS   multivitamin  1 tablet Per Tube QHS   pantoprazole sodium  40 mg Per Tube Daily  sodium chloride flush  10-40 mL Intracatheter Q12H   sodium chloride flush  3 mL Intravenous Q12H   thiamine  100 mg Per Tube Daily   venlafaxine  37.5 mg Per Tube BID   Continuous Infusions:  sodium chloride     [START ON 07/13/2021] sodium chloride     [START ON 07/13/2021] sodium chloride     feeding supplement (OSMOLITE 1.5 CAL) 1,000 mL (07/12/21 0056)   iron sucrose Stopped (07/12/21 1255)   Consultants:see note  Procedures:see note Antimicrobials: Anti-infectives (From admission, onward)    Start     Dose/Rate Route Frequency Ordered Stop   07/02/21 1116  ceFAZolin (ANCEF) IVPB 2g/100 mL premix        over 30 Minutes Intravenous Continuous PRN 07/02/21 1139 07/02/21 1116   07/02/21 1111  ceFAZolin (ANCEF) 2-4 GM/100ML-% IVPB       Note to Pharmacy: Domenick Bookbinder   : cabinet override      07/02/21 1111 07/02/21 2314   07/01/21 1215  ceFAZolin (ANCEF) IVPB 2g/100 mL premix        2 g 200 mL/hr over 30 Minutes Intravenous On call 07/01/21 0902 07/02/21 1215   05/29/21 1622  ceFAZolin (ANCEF) 2-4 GM/100ML-% IVPB       Note to Pharmacy: Lytle Butte   : cabinet  override      05/29/21 1622 05/30/21 0429   05/29/21 0000  cefTRIAXone (ROCEPHIN) 1 g in sodium chloride 0.9 % 100 mL IVPB  Status:  Discontinued        1 g 200 mL/hr over 30 Minutes Intravenous Every 24 hours 05/28/21 1801 05/28/21 1801   05/28/21 1830  cefTRIAXone (ROCEPHIN) 1 g in sodium chloride 0.9 % 100 mL IVPB        1 g 200 mL/hr over 30 Minutes Intravenous Every 24 hours 05/28/21 1801 05/30/21 1907   05/28/21 1630  ceFAZolin (ANCEF) IVPB 2g/100 mL premix        2 g 200 mL/hr over 30 Minutes Intravenous To Radiology 05/28/21 1537 05/29/21 1630      Culture/Microbiology    Component Value Date/Time   SDES BLOOD RIGHT ANTECUBITAL 06/26/2021 1234   SPECREQUEST  06/26/2021 1234    BOTTLES DRAWN AEROBIC AND ANAEROBIC Blood Culture adequate volume   CULT  06/26/2021 1234    NO GROWTH 5 DAYS Performed at [facility], [place] [address] [place], [place] [address]    REPTSTATUS 07/01/2021 FINAL 06/26/2021 1234    Other culture-see note  Objective: Vitals: Today's Vitals   07/12/21 1100 07/12/21 1130 07/12/21 1200 07/12/21 1230  BP: (!) 96/55 (!) 87/47 (!) 90/54 (!) 88/76  Pulse: (!) 110 (!) 112 (!) 111 (!) 113  Resp:      Temp:      TempSrc:      SpO2:      Weight:      Height:      PainSc:        Intake/Output Summary (Last 24 hours) at 07/12/2021 1302 Last data filed at 07/12/2021 [id number] Gross per 24 hour  Intake 0 ml  Output 0 ml  Net 0 ml    Filed Weights   07/08/21 0850 07/08/21 1250 07/10/21 1510  Weight: 102.1 kg 98 kg 100.9 kg   Weight change:   Intake/Output from previous day: No intake/output data recorded. Intake/Output this shift: No intake/output data recorded. Filed Weights   07/08/21 0850 07/08/21 1250 07/10/21 1510  Weight: 102.1 kg 98 kg 100.9 kg  Examination: General exam: AAO self, current president, elderly, older than stated age, weak appearing. HEENT:Oral mucosa moist, Ear/Nose WNL grossly, dentition normal. Respiratory system:  bilaterally clear no added sounds, no use of accessory muscle Cardiovascular system: S1 & S2 +, No JVD. Gastrointestinal system: Abdomen soft, core track present NT,ND, BS+ Nervous System:Alert, awake, spastic paralysis on the left side  Extremities: Edematous upper extremities, spastic left upper extremity Skin: No rashes,no icterus. MSK: Normal muscle bulk,tone, power . HD catheter in the chest  Data Reviewed: I have personally reviewed following labs and imaging studies CBC: Recent Labs  Lab 07/07/21 0027 07/07/21 1411 07/08/21 0422 07/09/21 0351 07/10/21 1532  WBC 10.0 8.2 9.3 9.5 12.5*  HGB 8.9* 8.5* 8.9* 8.6* 8.3*  HCT 27.9* 27.1* 28.2* 27.9* 26.8*  MCV 92.1 94.1 93.7 95.2 95.7  PLT 241 262 265 186 [id number]    Basic Metabolic Panel: Recent Labs  Lab 07/07/21 0027 07/09/21 0351 07/10/21 1532 07/11/21 1123  NA 131* 131* 125* 131*  K 4.0 4.3 5.0 4.4  CL 96* 95* 89* 94*  CO2 '23 27 26 30  '$ GLUCOSE 73 204* 165* 129*  BUN 20 16 28* 18  CREATININE 3.17* 2.61* 3.32* 2.40*  CALCIUM 8.2* 8.3* 7.9* 8.3*  PHOS  --  2.4* 2.2* 1.9*    GFR: Estimated Creatinine Clearance: 25.5 mL/min (A) (by C-G formula based on SCr of 2.4 mg/dL (H)). Liver Function Tests: Recent Labs  Lab 07/07/21 0027 07/09/21 0351 07/10/21 1532 07/11/21 1123  AST 28  --   --   --   ALT 6  --   --   --   ALKPHOS 90  --   --   --   BILITOT 0.4  --   --   --   PROT 5.8*  --   --   --   ALBUMIN 1.8* 2.2* 1.9* 2.0*    No results for input(s): LIPASE, AMYLASE in the last 168 hours. No results for input(s): AMMONIA in the last 168 hours. Coagulation Profile: No results for input(s): INR, PROTIME in the last 168 hours. Cardiac Enzymes: No results for input(s): CKTOTAL, CKMB, CKMBINDEX, TROPONINI in the last 168 hours. BNP (last 3 results) No results for input(s): PROBNP in the last 8760 hours. HbA1C: No results for input(s): HGBA1C in the last 72 hours. CBG: Recent Labs  Lab 07/11/21 1657  07/11/21 2017 07/12/21 0046 07/12/21 0413 07/12/21 0722  GLUCAP 139* 137* 169* 158* 79    Lipid Profile: No results for input(s): CHOL, HDL, LDLCALC, TRIG, CHOLHDL, LDLDIRECT in the last 72 hours. Thyroid Function Tests: No results for input(s): TSH, T4TOTAL, FREET4, T3FREE, THYROIDAB in the last 72 hours. Anemia Panel: No results for input(s): VITAMINB12, FOLATE, FERRITIN, TIBC, IRON, RETICCTPCT in the last 72 hours. Sepsis Labs: No results for input(s): PROCALCITON, LATICACIDVEN in the last 168 hours.  No results found for this or any previous visit (from the past 240 hour(s)).     Radiology Studies: No results found.   LOS: 64 days   Antonieta Pert, MD [facility]  07/12/2021, 1:02 PM

## 2021-07-12 NOTE — Progress Notes (Signed)
[name] Progress Note   Subjective:  Seen in HD unit, in recliner. Talking about rats going under her bed.   Objective Vitals:   07/12/21 0950 07/12/21 0955 07/12/21 0959 07/12/21 1030  BP: 117/74 101/68 (!) 82/66 106/70  Pulse: 100 100 (!) 109 (!) 107  Resp: 18     Temp: 97.6 F (36.4 C)     TempSrc: Oral     SpO2: 96%     Weight:      Height:       Physical Exam General: Elderly woman, ill appearing, in soft restraints  Heart:RRR, no mrg Lungs:CTAB anterolaterally, nml WOB on RA  Abdomen: Soft and non-tender; active bowel sounds Extremities: BLUEs edema improving  Dialysis Access: LUE TDC  in place   [facility] Weights   07/08/21 0850 07/08/21 1250 07/10/21 1510  Weight: 102.1 kg 98 kg 100.9 kg    Intake/Output Summary (Last 24 hours) at 07/12/2021 1055 Last data filed at 07/12/2021 0826 Gross per 24 hour  Intake 0 ml  Output 0 ml  Net 0 ml     Additional Objective Labs: Basic Metabolic Panel: Recent Labs  Lab 07/09/21 0351 07/10/21 1532 07/11/21 1123  NA 131* 125* 131*  K 4.3 5.0 4.4  CL 95* 89* 94*  CO2 '27 26 30  '$ GLUCOSE 204* 165* 129*  BUN 16 28* 18  CREATININE 2.61* 3.32* 2.40*  CALCIUM 8.3* 7.9* 8.3*  PHOS 2.4* 2.2* 1.9*    Liver Function Tests: Recent Labs  Lab 07/07/21 0027 07/09/21 0351 07/10/21 1532 07/11/21 1123  AST 28  --   --   --   ALT 6  --   --   --   ALKPHOS 90  --   --   --   BILITOT 0.4  --   --   --   PROT 5.8*  --   --   --   ALBUMIN 1.8* 2.2* 1.9* 2.0*    No results for input(s): LIPASE, AMYLASE in the last 168 hours. CBC: Recent Labs  Lab 07/07/21 0027 07/07/21 1411 07/08/21 0422 07/09/21 0351 07/10/21 1532  WBC 10.0 8.2 9.3 9.5 12.5*  HGB 8.9* 8.5* 8.9* 8.6* 8.3*  HCT 27.9* 27.1* 28.2* 27.9* 26.8*  MCV 92.1 94.1 93.7 95.2 95.7  PLT 241 262 265 186 234    Blood Culture    Component Value Date/Time   SDES BLOOD RIGHT ANTECUBITAL 06/26/2021 1234   SPECREQUEST  06/26/2021 1234    BOTTLES  DRAWN AEROBIC AND ANAEROBIC Blood Culture adequate volume   CULT  06/26/2021 1234    NO GROWTH 5 DAYS Performed at [facility], [place] [address], [place], [place] [address]    REPTSTATUS 07/01/2021 FINAL 06/26/2021 1234    Cardiac Enzymes: No results for input(s): CKTOTAL, CKMB, CKMBINDEX, TROPONINI in the last 168 hours. CBG: Recent Labs  Lab 07/11/21 1657 07/11/21 2017 07/12/21 0046 07/12/21 0413 07/12/21 0722  GLUCAP 139* 137* 169* 158* 79    Iron Studies: No results for input(s): IRON, TIBC, TRANSFERRIN, FERRITIN in the last 72 hours. Lab Results  Component Value Date   INR 0.9 03/10/2021   INR 1.0 01/26/2021   Studies/Results: No results found.  Medications:  sodium chloride     feeding supplement (OSMOLITE 1.5 CAL) 1,000 mL (07/12/21 0056)   iron sucrose 100 mg (07/10/21 1711)    apixaban  2.5 mg Per Tube BID   atorvastatin  40 mg Per Tube Daily   chlorhexidine  15  mL Mouth Rinse BID   Chlorhexidine Gluconate Cloth  6 each Topical Daily   darbepoetin (ARANESP) injection - DIALYSIS  200 mcg Intravenous Q Thu-HD   feeding supplement  237 mL Oral BID BM   feeding supplement (PROSource TF)  45 mL Per Tube Daily   gabapentin  100 mg Per Tube Q12H   insulin aspart  0-6 Units Subcutaneous Q4H   lactulose  20 g Per Tube Daily   mouth rinse  15 mL Mouth Rinse q12n4p   midodrine  10 mg Per Tube TID WC   mirtazapine  7.5 mg Per Tube QHS   multivitamin  1 tablet Per Tube QHS   pantoprazole sodium  40 mg Per Tube Daily   sodium chloride flush  10-40 mL Intracatheter Q12H   sodium chloride flush  3 mL Intravenous Q12H   thiamine  100 mg Per Tube Daily   venlafaxine  37.5 mg Per Tube BID    Dialysis Orders: AF MWF  3h 18mn 400/500    62.5kg    3K/2.5 Ca    P2  AVG HeRO   Hep none - Mircera 50 q 2 wks  (7/13)  Venofer 50 q wk - Parsabiv '5mg'$  IV q HD  Assessment/Plan: AMS/FTT: Mental status is improving and she is tolerating being in chair during HD. HD done   in the bed d/t restraints on 9/13. Pt will have to be able to sit in a chair for 4-6 hrs without restraints to qualify for OP HD w/ our group.  If  she continues to tolerate sitting in the chair for dialysis and cooperate, then will re-CLIP the patient as her OP HD unit will not take her back. HD Nurse Coordinator and Renal Navigator following, stated prior to re-clipping her, we need to ensure patient nutritional status is stable.  Melena/Rectal bleeding - GI consulted. Hx rectal ulcers.  EGD 7/27 normal, flex sig with mucosal ulceration c/w sterocoral ulcers, non bleeding. Hgb 8s, stable. ESRD: HD TTS while here. HD today  Dysphagia -  Patient refusing G-tube placement.  Access Issues/AVG clotted: Hadf'gram  6/23 which showed chronic thrombus and was started on Eliquis. AVG clotted 9/2, unable to be declotted d/t arm contracture. S/P conversion to tunneled HD catheter 9/7 via IR-on Heparin drip, appreciate their assistance.  Hypertension/volume: BP stable, on midodrine '10mg'$  TID with significant overload on exam. Net UF Need to ^ UFG with HD as tolerated, can give IV albumin prn.   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  Getting TFs via Core Track-RD managing tube. Refuses G-tube. 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 9. Psych eval 9/14:  It is determined that patient has functional mental capacity for medical decision-making.  10. Anemia: Continue Aranesp-dose increase to 200 mcg q week on 9/15   11. Dispo: DNR/DNI. Poor prognosis. Palliative care has seen see #1. Appreciate their involvement. Although patient is currently tolerating being in the chair for HD, we do not believe dialysis is adding to her quality of life. Per family communication, family wishes to continue. Patient is refusing G-tube placement which places another barrier [facility]ed [facility] with [facility] coordinator). Ongoing discussions with multi-discipline team-continue CoreTrack with  tube feedings.  OLynnda ChildPA-C [facility] 07/12/2021,10:55 AM

## 2021-07-13 DIAGNOSIS — D638 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: Secondary | ICD-10-CM | POA: Diagnosis not present

## 2021-07-13 DIAGNOSIS — K922 Gastrointestinal hemorrhage, unspecified: Secondary | ICD-10-CM | POA: Diagnosis not present

## 2021-07-13 LAB — GLUCOSE, CAPILLARY
Glucose-Capillary: 137 mg/dL — ABNORMAL HIGH (ref 70–99)
Glucose-Capillary: 146 mg/dL — ABNORMAL HIGH (ref 70–99)
Glucose-Capillary: 148 mg/dL — ABNORMAL HIGH (ref 70–99)
Glucose-Capillary: 153 mg/dL — ABNORMAL HIGH (ref 70–99)
Glucose-Capillary: 172 mg/dL — ABNORMAL HIGH (ref 70–99)

## 2021-07-13 MED ORDER — INSULIN ASPART 100 UNIT/ML IJ SOLN
0.0000 [IU] | Freq: Three times a day (TID) | INTRAMUSCULAR | Status: DC
  Administered 2021-07-14 – 2021-07-16 (×6): 1 [IU] via SUBCUTANEOUS
  Administered 2021-07-17 – 2021-07-18 (×2): 2 [IU] via SUBCUTANEOUS

## 2021-07-13 NOTE — Progress Notes (Signed)
[facility] Progress Note   Subjective:   Seen in room. Sleeping, but rouses to voice. Tolerated HD in recliner yesterday   Objective Vitals:   07/12/21 1721 07/12/21 2003 07/13/21 0620 07/13/21 0921  BP: 122/64 124/71 (!) 128/43 118/60  Pulse: (!) 101  (!) 106 (!) 106  Resp: '18 18 20   '$ Temp: 98.7 F (37.1 C) 97.6 F (36.4 C) 98 F (36.7 C) 99.1 F (37.3 C)  TempSrc: Oral Oral Oral Oral  SpO2: 100% 99% 92% 99%  Weight:      Height:       Physical Exam General: Elderly woman, ill appearing, nad  Heart:RRR, no mrg Lungs:CTAB anterolaterally, nml WOB on RA  Abdomen: Soft and non-tender; active bowel sounds Extremities: BLUEs edema improving  Dialysis Access: LUE TDC  in place   [facility] Weights   07/08/21 0850 07/08/21 1250 07/10/21 1510  Weight: 102.1 kg 98 kg 100.9 kg    Intake/Output Summary (Last 24 hours) at 07/13/2021 0953 Last data filed at 07/13/2021 0600 Gross per 24 hour  Intake 600 ml  Output 1500 ml  Net -900 ml     Additional Objective Labs: Basic Metabolic Panel: Recent Labs  Lab 07/09/21 0351 07/10/21 1532 07/11/21 1123  NA 131* 125* 131*  K 4.3 5.0 4.4  CL 95* 89* 94*  CO2 '27 26 30  '$ GLUCOSE 204* 165* 129*  BUN 16 28* 18  CREATININE 2.61* 3.32* 2.40*  CALCIUM 8.3* 7.9* 8.3*  PHOS 2.4* 2.2* 1.9*    Liver Function Tests: Recent Labs  Lab 07/07/21 0027 07/09/21 0351 07/10/21 1532 07/11/21 1123  AST 28  --   --   --   ALT 6  --   --   --   ALKPHOS 90  --   --   --   BILITOT 0.4  --   --   --   PROT 5.8*  --   --   --   ALBUMIN 1.8* 2.2* 1.9* 2.0*    No results for input(s): LIPASE, AMYLASE in the last 168 hours. CBC: Recent Labs  Lab 07/07/21 0027 07/07/21 1411 07/08/21 0422 07/09/21 0351 07/10/21 1532  WBC 10.0 8.2 9.3 9.5 12.5*  HGB 8.9* 8.5* 8.9* 8.6* 8.3*  HCT 27.9* 27.1* 28.2* 27.9* 26.8*  MCV 92.1 94.1 93.7 95.2 95.7  PLT 241 262 265 186 234    Blood Culture    Component Value Date/Time   SDES  BLOOD RIGHT ANTECUBITAL 06/26/2021 1234   SPECREQUEST  06/26/2021 1234    BOTTLES DRAWN AEROBIC AND ANAEROBIC Blood Culture adequate volume   CULT  06/26/2021 1234    NO GROWTH 5 DAYS Performed at [facility], [place] [address], [place], [place] [address]    REPTSTATUS 07/01/2021 FINAL 06/26/2021 1234    Cardiac Enzymes: No results for input(s): CKTOTAL, CKMB, CKMBINDEX, TROPONINI in the last 168 hours. CBG: Recent Labs  Lab 07/12/21 1422 07/12/21 1629 07/12/21 2011 07/13/21 0003 07/13/21 0738  GLUCAP 123* 158* 158* 172* 137*    Iron Studies: No results for input(s): IRON, TIBC, TRANSFERRIN, FERRITIN in the last 72 hours. Lab Results  Component Value Date   INR 0.9 03/10/2021   INR 1.0 01/26/2021   Studies/Results: No results found.  Medications:  sodium chloride     feeding supplement (OSMOLITE 1.5 CAL) 1,000 mL (07/12/21 2126)   iron sucrose Stopped (07/12/21 1255)    apixaban  2.5 mg Per Tube BID   atorvastatin  40 mg  Per Tube Daily   chlorhexidine  15 mL Mouth Rinse BID   Chlorhexidine Gluconate Cloth  6 each Topical Daily   darbepoetin (ARANESP) injection - DIALYSIS  200 mcg Intravenous Q Thu-HD   feeding supplement  237 mL Oral BID BM   feeding supplement (PROSource TF)  45 mL Per Tube Daily   gabapentin  100 mg Per Tube Q12H   insulin aspart  0-6 Units Subcutaneous Q4H   lactulose  20 g Per Tube Daily   mouth rinse  15 mL Mouth Rinse q12n4p   midodrine  10 mg Per Tube TID WC   mirtazapine  7.5 mg Per Tube QHS   multivitamin  1 tablet Per Tube QHS   pantoprazole sodium  40 mg Per Tube Daily   sodium chloride flush  10-40 mL Intracatheter Q12H   sodium chloride flush  3 mL Intravenous Q12H   thiamine  100 mg Per Tube Daily   venlafaxine  37.5 mg Per Tube BID    Dialysis Orders: AF MWF  3h 16mn 400/500    62.5kg    3K/2.5 Ca    P2  AVG HeRO   Hep none - Mircera 50 q 2 wks  (7/13)  Venofer 50 q wk - Parsabiv '5mg'$  IV q  HD  Assessment/Plan: AMS/FTT: Mental status is improving and she is tolerating being in chair during HD. HD done  in the bed d/t restraints on 9/13. Pt will have to be able to sit in a chair for 4-6 hrs without restraints to qualify for OP HD w/ our group.  If  she continues to tolerate sitting in the chair for dialysis and cooperate, then will re-CLIP the patient as her OP HD unit will not take her back. HD Nurse Coordinator and Renal Navigator following, stated prior to re-clipping her, we need to ensure patient nutritional status is stable.  Melena/Rectal bleeding - GI consulted. Hx rectal ulcers.  EGD 7/27 normal, flex sig with mucosal ulceration c/w sterocoral ulcers, non bleeding. Hgb 8s, stable. ESRD: HD TTS while here. Next HD 9/20   Dysphagia -  Patient refusing G-tube placement.  Access Issues/AVG clotted: Hadf'gram  6/23 which showed chronic thrombus and was started on Eliquis. AVG clotted 9/2, unable to be declotted d/t arm contracture. S/P conversion to tunneled HD catheter 9/7 via IR-on Heparin drip, appreciate their assistance.  Hypertension/volume: BP stable, on midodrine '10mg'$  TID with significant overload on exam. Net UF Need to ^ UFG with HD as tolerated, can give IV albumin prn.   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  Getting TFs via Core Track-RD managing tube. Refuses G-tube. 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 9. Psych eval 9/14:  It is determined that patient has functional mental capacity for medical decision-making.  10. Anemia: Continue Aranesp-dose increase to 200 mcg q week on 9/15   11. Dispo: DNR/DNI. Poor prognosis. Palliative care has seen see #1. Appreciate their involvement. Although patient is currently tolerating being in the chair for HD, we do not believe dialysis is adding to her quality of life. Per family communication, family wishes to continue. Patient is refusing G-tube placement which places another barrier because [facility](discussed this with HD coordinator). Ongoing discussions with multi-discipline team-continue CoreTrack with tube feedings.  OLynnda ChildPA-C [facility] 07/13/2021,9:53 AM

## 2021-07-13 NOTE — Progress Notes (Signed)
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 She has seen by GI and underwent EGD that was normal and flex sigmoidoscopy that showed nonbleeding distal rectal ulcer/stercoral ulcers and GI signed off.  She had altered mental status seen by neurology MRI similar to prior MRI at [facility] with a large right MCA stroke, seen by nephrology for dialysis needs.  Patient having ongoing confusion weakness inability to sit in dialysis recliner and so not accepted back to outpatient dialysis at this time and awaiting further improvement.  She has had poor intake failure to thrive family wanted PEG tube. GI tried to place but was unable to place because of overlying transverse colon and has been on core track NG tube feeding.  Seen by surgery for G-tube placement.  Core track was clogged and changed 07/07/21. 9/13-psych consulted for competency and patient does have functional mental capacity for medical decision - she subsequently refused G tube.   Consultants:  Gastroenterology Nephrology Psychiatry  Code Status: NO CODE BLUE  Antimicrobials:  None  DVT prophylaxis: Eliquis  Subjective: Appears to be resting comfortably at the time of visit.  No respiratory distress.  Does not attempt to communicate with me.  Assessment & Plan:  Acute on chronic lower GI bleeding from stercoral ulcers Status post EGD and flexible sigmoidoscopy by GI -hemoglobin has been stable  Anemia of chronic renal disease and recurrent GI loss Hemoglobin holding steady -reassess in  a.m.  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 Continue PT/OT and supportive care -continue Eliquis and Lipitor  Failure to thrive /generalized deconditioning Family/son desired G-tube placement but patient herself refused -has been declared competent to make her own decisions by psychiatry evaluation  Acute encephalopathy Neurology has evaluated -MRI this admission unchanged from baseline MRI at [facility] -no significant improvement in mental status today  ESRD on HD MWF Transitioned to TTS schedule while in hospital -[facility] placed 9/7 -outpatient dialysis center unable to accept patient unless she is able to sit in a dialysis chair 4-6 hours per session -nephrology has suggested hospice but patient and family wish to continue dialysis  Chronic constipation Continue bowel regimen  DM2 uncontrolled with episodes of hypoglycemia Due to poor intake -A1c 5.1 -not on medications as outpatient -CBG controlled  Chronic left upper extremity thrombus Continue Eliquis  GERD Continue PPI  Severe malnutrition     Family Communication: No family present at time of exam Status is: Inpatient  Remains inpatient appropriate because:Unsafe d/c plan  Dispo: The patient is from: SNF              Anticipated d/c is to: SNF              Patient currently is not medically stable to d/c.   Difficult to place patient No  Objective: Blood pressure 118/60, pulse (!) 106, temperature 99.1 F (37.3 C), temperature source Oral, resp. rate 20, height '5\' 7"'$  (1.702 m), weight 100.9 kg, SpO2 99 %.  Intake/Output Summary (Last 24 hours) at 07/13/2021 1020 Last data filed  at 07/13/2021 0600 Gross per 24 hour  Intake 600 ml  Output 1500 ml  Net -900 ml   Filed Weights   07/08/21 0850 07/08/21 1250 07/10/21 1510  Weight: 102.1 kg 98 kg 100.9 kg    Examination: General: No acute respiratory distress Lungs: Clear to auscultation bilaterally without wheezes or  crackles Cardiovascular: Regular rate and rhythm without murmur gallop or rub normal S1 and S2 Abdomen: Nontender, nondistended, soft, bowel sounds positive, no rebound, no ascites, no appreciable mass Extremities: No significant cyanosis, clubbing, or edema bilateral lower extremities  CBC: Recent Labs  Lab 07/08/21 0422 07/09/21 0351 07/10/21 1532  WBC 9.3 9.5 12.5*  HGB 8.9* 8.6* 8.3*  HCT 28.2* 27.9* 26.8*  MCV 93.7 95.2 95.7  PLT 265 186 [id number]   Basic Metabolic Panel: Recent Labs  Lab 07/09/21 0351 07/10/21 1532 07/11/21 1123  NA 131* 125* 131*  K 4.3 5.0 4.4  CL 95* 89* 94*  CO2 '27 26 30  '$ GLUCOSE 204* 165* 129*  BUN 16 28* 18  CREATININE 2.61* 3.32* 2.40*  CALCIUM 8.3* 7.9* 8.3*  PHOS 2.4* 2.2* 1.9*   GFR: Estimated Creatinine Clearance: 25.5 mL/min (A) (by C-G formula based on SCr of 2.4 mg/dL (H)).  Liver Function Tests: Recent Labs  Lab 07/07/21 0027 07/09/21 0351 07/10/21 1532 07/11/21 1123  AST 28  --   --   --   ALT 6  --   --   --   ALKPHOS 90  --   --   --   BILITOT 0.4  --   --   --   PROT 5.8*  --   --   --   ALBUMIN 1.8* 2.2* 1.9* 2.0*    HbA1C: Hgb A1c MFr Bld  Date/Time Value Ref Range Status  05/28/2021 07:38 AM 5.1 4.8 - 5.6 % Final    Comment:    (NOTE) Pre diabetes:          5.7%-6.4%  Diabetes:              >6.4%  Glycemic control for   <7.0% adults with diabetes     CBG: Recent Labs  Lab 07/12/21 1422 07/12/21 1629 07/12/21 2011 07/13/21 0003 07/13/21 0738  GLUCAP 123* 158* 158* 172* 137*     Scheduled Meds:  apixaban  2.5 mg Per Tube BID   atorvastatin  40 mg Per Tube Daily   chlorhexidine  15 mL Mouth Rinse BID   Chlorhexidine Gluconate Cloth  6 each Topical Daily   darbepoetin (ARANESP) injection - DIALYSIS  200 mcg Intravenous Q Thu-HD   feeding supplement  237 mL Oral BID BM   feeding supplement (PROSource TF)  45 mL Per Tube Daily   gabapentin  100 mg Per Tube Q12H   insulin aspart  0-6 Units  Subcutaneous Q4H   lactulose  20 g Per Tube Daily   mouth rinse  15 mL Mouth Rinse q12n4p   midodrine  10 mg Per Tube TID WC   mirtazapine  7.5 mg Per Tube QHS   multivitamin  1 tablet Per Tube QHS   pantoprazole sodium  40 mg Per Tube Daily   sodium chloride flush  10-40 mL Intracatheter Q12H   sodium chloride flush  3 mL Intravenous Q12H   thiamine  100 mg Per Tube Daily   venlafaxine  37.5 mg Per Tube BID   Continuous Infusions:  sodium chloride     feeding supplement (OSMOLITE 1.5 CAL) 1,000 mL (07/12/21  2126)   iron sucrose Stopped (07/12/21 1255)     LOS: 50 days   Cherene Altes, MD [facility] Office  [phone] Pager - Text Page per Amion  If 7PM-7AM, please contact night-coverage per Amion 07/13/2021, 10:20 AM

## 2021-07-14 DIAGNOSIS — N186 End stage renal disease: Secondary | ICD-10-CM | POA: Diagnosis not present

## 2021-07-14 DIAGNOSIS — E43 Unspecified severe protein-calorie malnutrition: Secondary | ICD-10-CM | POA: Diagnosis not present

## 2021-07-14 DIAGNOSIS — E1122 Type 2 diabetes mellitus with diabetic chronic kidney disease: Secondary | ICD-10-CM | POA: Diagnosis not present

## 2021-07-14 DIAGNOSIS — R627 Adult failure to thrive: Secondary | ICD-10-CM | POA: Diagnosis not present

## 2021-07-14 LAB — GLUCOSE, CAPILLARY
Glucose-Capillary: 181 mg/dL — ABNORMAL HIGH (ref 70–99)
Glucose-Capillary: 132 mg/dL — ABNORMAL HIGH (ref 70–99)
Glucose-Capillary: 152 mg/dL — ABNORMAL HIGH (ref 70–99)
Glucose-Capillary: 177 mg/dL — ABNORMAL HIGH (ref 70–99)

## 2021-07-14 LAB — CBC
HCT: 28.1 % — ABNORMAL LOW (ref 36.0–46.0)
Hemoglobin: 8.2 g/dL — ABNORMAL LOW (ref 12.0–15.0)
MCH: 29.4 pg (ref 26.0–34.0)
MCHC: 29.2 g/dL — ABNORMAL LOW (ref 30.0–36.0)
MCV: 100.7 fL — ABNORMAL HIGH (ref 80.0–100.0)
Platelets: 272 10*3/uL (ref 150–400)
RBC: 2.79 MIL/uL — ABNORMAL LOW (ref 3.87–5.11)
RDW: 20 % — ABNORMAL HIGH (ref 11.5–15.5)
WBC: 11.8 10*3/uL — ABNORMAL HIGH (ref 4.0–10.5)
nRBC: 0.2 % (ref 0.0–0.2)

## 2021-07-14 MED ORDER — CHLORHEXIDINE GLUCONATE CLOTH 2 % EX PADS: 6.0000 | MEDICATED_PAD | Freq: Every day | CUTANEOUS | Status: DC

## 2021-07-14 NOTE — Plan of Care (Signed)
  Problem: Clinical Measurements: Goal: Ability to maintain clinical measurements within normal limits will improve Outcome: Progressing   

## 2021-07-14 NOTE — Care Plan (Signed)
Seen examined Alert,awake. Follows simple commands Still not eating No change in plan of care- discussed with NP-refer to her note for more details  Issues: Acute on chronic lower GI bleeding from stercoral ulcer-resolved Anemia of chronic renal disease:S/P EGD and flex sigmoidoscopy. H/h 8.2 gm 9/19. 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  Constipation Hypotension on midodrine Type 2 diabetes mellitus with episodes of hypoglycemia A1c 5.1, stable sugar Chronic thrombus left upper extremity  on eliquis GERD  Stage II mid coccyx pressure injury   Cont TF, cont meds, Cont HDTTS per nephro, encourage po intake and meds.

## 2021-07-14 NOTE — Progress Notes (Signed)
[facility] Progress Note   Subjective:   Patient seen and examined at bedside in room.  Oriented to year.  I asked where she was multiple times and only able to say in the bed, other stated I dont know.  Denies CP, SOB, abdominal pain and n/v/d.   Objective Vitals:   07/13/21 1647 07/13/21 2222 07/14/21 0501 07/14/21 1128  BP: (!) 135/44 (!) 153/58 (!) 142/72 (!) 131/57  Pulse: 90 98 85 92  Resp: '18 18 16   '$ Temp: 98.5 F (36.9 C) 98.7 F (37.1 C) 98.4 F (36.9 C)   TempSrc: Oral Oral    SpO2: 97% 100%    Weight:      Height:       Physical Exam General:chronically ill appearing, elderly female in NAD, oriented to year Heart:RRR no mrg Lungs:CTAB, nml WOB on RA Abdomen:soft, NTND Extremities: 1-2+ edema in hips/thighs and UE Dialysis Access: [facility] in place   [facility] Weights   07/08/21 0850 07/08/21 1250 07/10/21 1510  Weight: 102.1 kg 98 kg 100.9 kg    Intake/Output Summary (Last 24 hours) at 07/14/2021 1154 Last data filed at 07/14/2021 0630 Gross per 24 hour  Intake 1225 ml  Output 0 ml  Net 1225 ml    Additional Objective Labs: Basic Metabolic Panel: Recent Labs  Lab 07/09/21 0351 07/10/21 1532 07/11/21 1123  NA 131* 125* 131*  K 4.3 5.0 4.4  CL 95* 89* 94*  CO2 '27 26 30  '$ GLUCOSE 204* 165* 129*  BUN 16 28* 18  CREATININE 2.61* 3.32* 2.40*  CALCIUM 8.3* 7.9* 8.3*  PHOS 2.4* 2.2* 1.9*   Liver Function Tests: Recent Labs  Lab 07/09/21 0351 07/10/21 1532 07/11/21 1123  ALBUMIN 2.2* 1.9* 2.0*   CBC: Recent Labs  Lab 07/07/21 1411 07/08/21 0422 07/09/21 0351 07/10/21 1532 07/14/21 0709  WBC 8.2 9.3 9.5 12.5* 11.8*  HGB 8.5* 8.9* 8.6* 8.3* 8.2*  HCT 27.1* 28.2* 27.9* 26.8* 28.1*  MCV 94.1 93.7 95.2 95.7 100.7*  PLT 262 265 186 234 272   CBG: Recent Labs  Lab 07/13/21 1131 07/13/21 1646 07/13/21 2225 07/14/21 0653 07/14/21 1131  GLUCAP 153* 146* 148* 181* 132*   Medications:  feeding supplement (OSMOLITE 1.5 CAL) 1,000  mL (07/13/21 2245)   iron sucrose Stopped (07/12/21 1255)    apixaban  2.5 mg Per Tube BID   atorvastatin  40 mg Per Tube Daily   chlorhexidine  15 mL Mouth Rinse BID   Chlorhexidine Gluconate Cloth  6 each Topical Daily   darbepoetin (ARANESP) injection - DIALYSIS  200 mcg Intravenous Q Thu-HD   feeding supplement  237 mL Oral BID BM   feeding supplement (PROSource TF)  45 mL Per Tube Daily   gabapentin  100 mg Per Tube Q12H   insulin aspart  0-6 Units Subcutaneous TID WC   lactulose  20 g Per Tube Daily   mouth rinse  15 mL Mouth Rinse q12n4p   midodrine  10 mg Per Tube TID WC   mirtazapine  7.5 mg Per Tube QHS   multivitamin  1 tablet Per Tube QHS   pantoprazole sodium  40 mg Per Tube Daily   sodium chloride flush  10-40 mL Intracatheter Q12H   thiamine  100 mg Per Tube Daily   venlafaxine  37.5 mg Per Tube BID    Dialysis Orders: AF MWF  3h 41mn 400/500    62.5kg    3K/2.5 Ca    P2  AVG HeRO  Hep none - Mircera 50 q 2 wks  (7/13)  Venofer 50 q wk - Parsabiv '5mg'$  IV q HD   Assessment/Plan: AMS/FTT: Mental status is improving and she is tolerating being in chair during HD. HD done in the bed d/t restraints on 9/13. Pt will have to be able to sit in a chair for 4-6 hrs without restraints to qualify for OP HD w/ our group.  If she continues to tolerate sitting in the chair for dialysis and cooperate, then will re-CLIP the patient as her OP HD unit will not take her back. HD Nurse Coordinator and Renal Navigator following, stated prior to re-clipping her, we need to ensure patient nutritional status is stable.  Melena/Rectal bleeding - GI consulted. Hx rectal ulcers.  EGD 7/27 normal, flex sig with mucosal ulceration c/w sterocoral ulcers, non bleeding. Hgb 8s, stable. ESRD: HD TTS while here. Next HD 9/20   Dysphagia -  Patient refusing G-tube placement.  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 AVG clotted 9/2, unable to  be declotted d/t arm contracture. S/P conversion to tunneled HD catheter 9/7 via IR-on Heparin drip, appreciate their assistance.  Hypertension/volume: BP stable, on midodrine '10mg'$  TID with significant overload on exam.  Continue to maximize UF as tolerated, can give albumin with HD for BP support.   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  Getting TFs via Core Track-RD managing tube. Refuses G-tube. 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 9. Psych eval 9/14:  It is determined that patient has functional mental capacity for medical decision-making.  10. Anemia: Continue Aranesp-dose increase to 200 mcg q week on 9/15.  Hgb 8.2 today.  11. Dispo: DNR/DNI. Poor prognosis. Palliative care has seen see #1. Appreciate their involvement. Although patient is currently tolerating being in the chair for HD, we do not believe dialysis is adding to her quality of life. Per family communication, family wishes to continue. Patient is refusing G-tube placement which places another barrier because patient cannot be discharged with the [facility] (discussed this with HD coordinator). Ongoing discussions with multi-discipline team-continue CoreTrack with tube feedings.  Jen Mow, PA-C [facility] 07/14/2021,11:54 AM  LOS: 55 days

## 2021-07-14 NOTE — Progress Notes (Signed)
[facility] PROGRESS NOTE  Amy [name] [id number] DOB: [birth year] DOA: 05/19/2021 PCP: [name], MD  Status:  Remains inpatient appropriate because:Unsafe d/c plan, IV treatments appropriate due to intensity of illness or inability to take PO, and Inpatient level of care appropriate due to severity of illness  Dispo: The patient is from: Home              Anticipated d/c is to: SNF              Patient currently is medically stable to d/c.   Difficult to place patient Yes              Barriers to discharge: Dialysis patient with markedly decreased mobility-we will need to prove tolerance of sitting in chair for hemodialysis while here as well as tolerating sitting in wheelchair for up to 1 hour x 2 to replicate transport process.  Cannot discharge and proceed with outpatient hemodialysis unless this is accomplished.    Level of care: Med-Surg  Code Status: DNR Family communication: Spoke with son Amy [name] on 9/19.  He is aware that since patient has been declared to have capacity that we cannot force her to have a feeding tube.  This has been an ongoing problem for the patient in regards to not eating.  He asked that I wait on discontinuing current feedings until he has a chance to speak with her today and see if he can at least get her to except a G-tube for feedings "if needed".  If not we again addressed end-of-life issues and that if she is not eating we should probably focus on comfort and likely discontinue dialysis as well.  I told him it takes about 1 to 3 weeks depending on patient's underlying clinical status to pass away after dialysis is discontinued.  He was agreeable to continuing this discussion if his mother continue to refuse a feeding tube. DVT prophylaxis: Eliquis currently on hold secondary to need for placement of [place] vaccination status: [name] 02/26/2020, Pfizer 04/10/2021    HPI: 75 y.o. female with PMH significant for ESRD on HD MWF, HTN,  HLD, CVA s/p TPA 02/2021 with resultant left hemiparesis, partial blood clot upper extremity, recently hospitalized to [facility] on 03/13/2021-04/22/2021 during which patient underwent flexible sigmoidoscopy on 03/26/2021 which showed multiple rectal ulcers likely secondary to trauma from enemas and constipation.  Patient presented to the ED on 7/25 with dark stool.  On presentation, hemoglobin was 9.6; GI was consulted.  She underwent EGD which was normal and flexible sigmoidoscopy which showed nonbleeding distal rectal ulcers/stercoral ulcers.  Subsequently, GI signed off.  Neurology was also consulted for altered mental status; MRI brain was similar to prior MRI at [facility] with a large right MCA stroke.   Nephrology has been following for dialysis needs; nephrology is stating that patient will not be accepted back to outpatient dialysis at this time due to her underlying confusion, weakness and inability to sit in a dialysis recliner as an outpatient.   Due to her poor oral intake, family wanted a PEG tube placement.  IR tried but was unable to place because of overlying transverse colon.   General surgery was consulted for G-tube placement but family currently undecided.  She has an NG tube in place.   Palliative care following for goals of care discussion.  Subjective: Patient awake.  Discussed possibility of removing feeding tube and mitten pending my discussion with her son.  Asked her if she would be  able to swallow her medications and she said yes.  Asked her if she would at least try to eat some hospital food and she stated yes and asked for a [place] salad.  Nursing at bedside during my conversation.  Objective: Vitals:   07/13/21 2222 07/14/21 0501  BP: (!) 153/58 (!) 142/72  Pulse: 98 85  Resp: 18 16  Temp: 98.7 F (37.1 C) 98.4 F (36.9 C)  SpO2: 100%     Intake/Output Summary (Last 24 hours) at 07/14/2021 0840 Last data filed at 07/14/2021 0630 Gross per 24 hour  Intake 1225 ml  Output 0 ml   Net 1225 ml   Filed Weights   07/08/21 0850 07/08/21 1250 07/10/21 1510  Weight: 102.1 kg 98 kg 100.9 kg    Exam:  Constitutional: NAD, calm, alert Respiratory: Lung sounds clear to auscultation, room air, no increased work of breathing Cardiovascular: S1-S2, pulse regular, stable peripheral edema Abdomen: Soft and nondistended with normoactive bowel sounds.[name] LBM 9/17 cortrack for feeding-extremely poor oral intake Neurologic: CN 2-12 grossly intact -Strength 3/5 on the right. 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    Assessment/Plan: Acute problems: Acute on chronic lower GI bleeding from stercoral ulcers -Patient presented with anemia,  underwent EGD which was normal and flex sigmoidoscopy with clean base, nonbleeding distal rectal ulcers (stercoral ulcers). Gastroenterology now signed off  -Hemoglobin stable (7-8).     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 Dysphagia-ruled out MRI brain done in this admission similar in appearance to her prior MRI at [facility] with no drastic changes EEG with no seizure activity or epileptiform discharges. Continue Lipitor (lipid pnl/HgbA1c WNL) Diet changed to regular and patient is allowed outside food. Patient evaluated psychiatric team this admission and determined to have capacity.   9/19: Lengthy discussion with patient's son regarding patient having capacity which disallows [place] from forcing feeding tube on patient.  He stated he would be coming back tonight to see patient and request that we leave feeding tube in until he can talk to her.  He is hopeful to convince her to have feeding tube placed "just in case" she is not eating well so she can receive intermittent bolus feedings.  I told son Amy [name] that if she refuses that we should likely change our focus to comfort.  If after discharge she is  not eating after discharge recommended that they stop dialysis as well.  He did not object and had further questions about what this would entail and how long people typically pass on after dialysis is stopped.  I told him it can take as little as several days to several weeks but that either way we can provide medications for comfort.  He agreed to continue this discussion if his mom absolutely refuses a feeding tube.  ESRD on HD Per Nephro Patient is not a candidate for continued outpatient HD given her confusion, weakness and inability to sit up in a chair for HD outpatient.  But as of 9/12 patient has been tolerating dialysis in chair 9/8 patient tolerating HD in chair well noting she is sleeping. Nephrology suggested transition to hospice care but family resistant.  PMT spoke with son by phone recently and he insisted that as long as his mother could speak that she would remain full aggressive care. Continue Neurontin and Oxy IR for suspected pain during dialysis TDC placed 9/7 and  is functional  Hyperkalemia/hypophosphatemia Management per nephrology HD -supplementation as needed   Hypotension Blood pressure currently is stable on midodrine.   Failure to thrive /Generalized deconditioning  /very poor oral intake PEG tube was desired by family but could not be placed because of overlying transverse colon.   Patient did not eat anything over the 4 days her tube feeding was held therefore tube feeding resumed at previous rate on 9/5 Discussed with general surgery on 8/31 and if patient can tolerate dialysis in chair and plan is to discuss possible open G-tube placed  Constipation Patient was on chronic narcotics prior to admission and these remain in place Patient was on 40 g Chronulac daily prn.  Continue scheduled Chronulac 20 g daily and presentation with rectal ulcers likely secondary to constipation chronic  Type 2 diabetes mellitus Episodes of hypoglycemia Hemoglobin A1c 5.1.  Diet  controlled at baseline  Chronic thrombus left upper extremity 9/9 will resume Eliquis post TDC placed 9/7 and is functional Elevate as appropriate  Stage II mid coccyx pressure injury, present on admission Continue wound care    GERD Could also be contributing to patient's poor oral intake Increased frequency of Protonix to BID sitter adding Pepcid     Data Reviewed: Basic Metabolic Panel: Recent Labs  Lab 07/09/21 0351 07/10/21 1532 07/11/21 1123  NA 131* 125* 131*  K 4.3 5.0 4.4  CL 95* 89* 94*  CO2 '27 26 30  '$ GLUCOSE 204* 165* 129*  BUN 16 28* 18  CREATININE 2.61* 3.32* 2.40*  CALCIUM 8.3* 7.9* 8.3*  PHOS 2.4* 2.2* 1.9*   Liver Function Tests: Recent Labs  Lab 07/09/21 0351 07/10/21 1532 07/11/21 1123  ALBUMIN 2.2* 1.9* 2.0*   No results for input(s): LIPASE, AMYLASE in the last 168 hours. No results for input(s): AMMONIA in the last 168 hours. CBC: Recent Labs  Lab 07/07/21 1411 07/08/21 0422 07/09/21 0351 07/10/21 1532 07/14/21 0709  WBC 8.2 9.3 9.5 12.5* 11.8*  HGB 8.5* 8.9* 8.6* 8.3* 8.2*  HCT 27.1* 28.2* 27.9* 26.8* 28.1*  MCV 94.1 93.7 95.2 95.7 100.7*  PLT 262 265 186 234 272    CBG: Recent Labs  Lab 07/13/21 0738 07/13/21 1131 07/13/21 1646 07/13/21 2225 07/14/21 0653  GLUCAP 137* 153* 146* 148* 181*     Scheduled Meds:  apixaban  2.5 mg Per Tube BID   atorvastatin  40 mg Per Tube Daily   chlorhexidine  15 mL Mouth Rinse BID   Chlorhexidine Gluconate Cloth  6 each Topical Daily   darbepoetin (ARANESP) injection - DIALYSIS  200 mcg Intravenous Q Thu-HD   feeding supplement  237 mL Oral BID BM   feeding supplement (PROSource TF)  45 mL Per Tube Daily   gabapentin  100 mg Per Tube Q12H   insulin aspart  0-6 Units Subcutaneous TID WC   lactulose  20 g Per Tube Daily   mouth rinse  15 mL Mouth Rinse q12n4p   midodrine  10 mg Per Tube TID WC   mirtazapine  7.5 mg Per Tube QHS   multivitamin  1 tablet Per Tube QHS   pantoprazole  sodium  40 mg Per Tube Daily   sodium chloride flush  10-40 mL Intracatheter Q12H   thiamine  100 mg Per Tube Daily   venlafaxine  37.5 mg Per Tube BID   Continuous Infusions:  feeding supplement (OSMOLITE 1.5 CAL) 1,000 mL (07/13/21 2245)   iron sucrose Stopped (07/12/21 1255)    Principal Problem:  GI bleed Active Problems:   Essential hypertension   Type 2 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, with long-term current use of insulin (HCC)   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   ESRD on dialysis ([place])   Dyslipidemia   Hypokalemia   Stage 2 skin ulcer of sacral region [facility])   Cerebral thrombosis with cerebral infarction   Protein-calorie malnutrition, severe   Acute GI bleeding   Oropharyngeal dysphagia   Failure to thrive in adult   Inadequate oral intake   Clogged feeding tube   Fever   Consultants: Vascular surgery Nephrology Gastroenterology Neurology Palliative medicine General surgery  Procedures: EEG Echocardiogram Cortrack EGD  Antibiotics: Ceftriaxone 8/3 through 8/5   Time spent: 25 minutes    Erin Hearing ANP  [facility] 7 am - 330 pm/M-F for direct patient care and secure chat Please refer to Amion for contact info 55  days

## 2021-07-14 NOTE — Progress Notes (Signed)
Calorie Count Note  48 hour calorie count ordered.  Diet: regular  Pt continues to receive TF via cortrak:  -Osmolite 1.5 @ 50ml/hr (1200ml/d)  -45ml Prosource TF daily   Provides 1840 kcals, 86g protein, 914ml free water  Pt sleeping soundly at time of visit. She did not arouse to voice. Noted pt consumed 0% of breakfast this morning. She is consuming 0% of meals per calorie count.   Pt continues to have prolonged poor oral intake. Per MD notes, pt is competent to make medical decisions and has refused g-tube. She remains withy cortrak tube. Palliative care following; son to come back in town today (07/14/21).   9/16 Breakfast: 0 kcals, 0 grams protein Lunch: 0 kcals, 0 grams protein Dinner: 0 kcals, 0 grams protein  Total intake: 0 kcal (0% of minimum estimated needs)  0 grams protein (0% of minimum estimated needs)  9/17 Breakfast: 0 kcals, 0 grams protein Lunch: nothing documented Dinner: 0 kcals, 0 grams protein  Total intake: 0 kcal (0% of minimum estimated needs)  0 grams protein (0% of minimum estimated needs)  9/18 Breakfast: 0 kcals, 0 grams protein Lunch: 0 kcals, 0 grams protein Dinner: 0 kcals, 0 grams protein  Total intake: 0 kcal (0% of minimum estimated needs)  0 grams protein (0% of minimum estimated needs)  Nutrition Dx: Severe Malnutrition related to chronic illness (ESRD on HD, prior CVA) as evidenced by percent weight loss, severe muscle depletion, energy intake < or equal to 75% for > or equal to 1 month. -- ongoing  Goal: Patient will meet greater than or equal to 90% of their needs -- met with TF  Intervention:   -D/c calorie count  -Ensure Enlive po BID, each supplement provides 350 kcal and 20 grams of protein   Continue TF via Cortrak: -Osmolite 1.5 @ 50ml/hr (1200ml/d)  -45ml Prosource TF daily   Provides 1840 kcals, 86g protein, 914ml free water   W, RD, LDN, CDCES Registered Dietitian II Certified Diabetes Care and  Education Specialist Please refer to AMION for RD and/or RD on-call/weekend/after hours pager   

## 2021-07-15 DIAGNOSIS — K922 Gastrointestinal hemorrhage, unspecified: Secondary | ICD-10-CM | POA: Diagnosis not present

## 2021-07-15 DIAGNOSIS — D638 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: Secondary | ICD-10-CM | POA: Diagnosis not present

## 2021-07-15 LAB — GLUCOSE, CAPILLARY
Glucose-Capillary: 122 mg/dL — ABNORMAL HIGH (ref 70–99)
Glucose-Capillary: 164 mg/dL — ABNORMAL HIGH (ref 70–99)
Glucose-Capillary: 169 mg/dL — ABNORMAL HIGH (ref 70–99)
Glucose-Capillary: 179 mg/dL — ABNORMAL HIGH (ref 70–99)

## 2021-07-15 LAB — RENAL FUNCTION PANEL
Albumin: 1.8 g/dL — ABNORMAL LOW (ref 3.5–5.0)
Anion gap: 8 (ref 5–15)
BUN: 39 mg/dL — ABNORMAL HIGH (ref 8–23)
CO2: 29 mmol/L (ref 22–32)
Calcium: 8.4 mg/dL — ABNORMAL LOW (ref 8.9–10.3)
Chloride: 93 mmol/L — ABNORMAL LOW (ref 98–111)
Creatinine, Ser: 3.51 mg/dL — ABNORMAL HIGH (ref 0.44–1.00)
GFR, Estimated: 13 mL/min — ABNORMAL LOW (ref >60)
Glucose, Bld: 137 mg/dL — ABNORMAL HIGH (ref 70–99)
Phosphorus: 2.4 mg/dL — ABNORMAL LOW (ref 2.5–4.6)
Potassium: 6.5 mmol/L (ref 3.5–5.1)
Sodium: 130 mmol/L — ABNORMAL LOW (ref 135–145)

## 2021-07-15 LAB — CBC
HCT: 27.9 % — ABNORMAL LOW (ref 36.0–46.0)
Hemoglobin: 8.3 g/dL — ABNORMAL LOW (ref 12.0–15.0)
MCH: 29.7 pg (ref 26.0–34.0)
MCHC: 29.7 g/dL — ABNORMAL LOW (ref 30.0–36.0)
MCV: 100 fL (ref 80.0–100.0)
Platelets: 310 10*3/uL (ref 150–400)
RBC: 2.79 MIL/uL — ABNORMAL LOW (ref 3.87–5.11)
RDW: 20 % — ABNORMAL HIGH (ref 11.5–15.5)
WBC: 11 10*3/uL — ABNORMAL HIGH (ref 4.0–10.5)
nRBC: 0 % (ref 0.0–0.2)

## 2021-07-15 MED ORDER — SODIUM CHLORIDE 0.9 % IV SOLN: 100.0000 mL | INTRAVENOUS | Status: DC | PRN

## 2021-07-15 MED ORDER — LIDOCAINE-PRILOCAINE 2.5-2.5 % EX CREA: 1.0000 "application " | TOPICAL_CREAM | CUTANEOUS | Status: DC | PRN

## 2021-07-15 MED ORDER — HEPARIN SODIUM (PORCINE) 1000 UNIT/ML DIALYSIS
1000.0000 [IU] | INTRAMUSCULAR | Status: DC | PRN
Start: 2021-07-15 — End: 2021-07-15
  Administered 2021-07-15: 1000 [IU] via INTRAVENOUS_CENTRAL
  Filled 2021-07-15: qty 1

## 2021-07-15 MED ORDER — PENTAFLUOROPROP-TETRAFLUOROETH EX AERO: 1.0000 "application " | INHALATION_SPRAY | CUTANEOUS | Status: DC | PRN

## 2021-07-15 MED ORDER — ALTEPLASE 2 MG IJ SOLR: 2.0000 mg | Freq: Once | INTRAMUSCULAR | Status: DC | PRN

## 2021-07-15 MED ORDER — LIDOCAINE HCL (PF) 1 % IJ SOLN: 5.0000 mL | INTRAMUSCULAR | Status: DC | PRN

## 2021-07-15 NOTE — Progress Notes (Addendum)
No family at bedside for dinner with outside food for the pt to eat. Pt doesn't want hospital food.

## 2021-07-15 NOTE — Progress Notes (Signed)
Pt off the unit to hemodialysis  

## 2021-07-15 NOTE — Progress Notes (Signed)
[facility] Progress Note   Subjective:   Patient seen and examined at chair side during dialysis.  Tolerating well so far.  K returned at 6.5, adjusted orders accordingly.  Not oriented, does not believe she is in dialysis.   Objective Vitals:   07/15/21 0930 07/15/21 1000 07/15/21 1027 07/15/21 1030  BP: (!) 153/74 131/77 (!) 94/32 (!) 112/57  Pulse: (!) 109 (!) 118 90 (!) 118  Resp:      Temp:      TempSrc:      SpO2:      Weight:      Height:       Physical Exam General:chronically ill appearing confused female in NAD, sitting in chair during dialysis Heart:RRR, no mrg Lungs:CTAB, nml WOB Abdomen:soft, NTND Extremities:1+ edema in hips/thighs and UE Dialysis Access: [facility] in use   Filed Weights   07/14/21 2107 07/15/21 0500 07/15/21 0753  Weight: 103 kg 103 kg 102.9 kg    Intake/Output Summary (Last 24 hours) at 07/15/2021 1059 Last data filed at 07/15/2021 0537 Gross per 24 hour  Intake 775.83 ml  Output 1 ml  Net 774.83 ml    Additional Objective Labs: Basic Metabolic Panel: Recent Labs  Lab 07/10/21 1532 07/11/21 1123 07/15/21 0812  NA 125* 131* 130*  K 5.0 4.4 6.5*  CL 89* 94* 93*  CO2 '26 30 29  '$ GLUCOSE 165* 129* 137*  BUN 28* 18 39*  CREATININE 3.32* 2.40* 3.51*  CALCIUM 7.9* 8.3* 8.4*  PHOS 2.2* 1.9* 2.4*   Liver Function Tests: Recent Labs  Lab 07/10/21 1532 07/11/21 1123 07/15/21 0812  ALBUMIN 1.9* 2.0* 1.8*   CBC: Recent Labs  Lab 07/09/21 0351 07/10/21 1532 07/14/21 0709 07/15/21 0812  WBC 9.5 12.5* 11.8* 11.0*  HGB 8.6* 8.3* 8.2* 8.3*  HCT 27.9* 26.8* 28.1* 27.9*  MCV 95.2 95.7 100.7* 100.0  PLT 186 234 272 310   CBG: Recent Labs  Lab 07/14/21 0653 07/14/21 1131 07/14/21 1612 07/14/21 2105 07/15/21 0644  GLUCAP 181* 132* 152* 177* 122*   Medications:  sodium chloride     sodium chloride     feeding supplement (OSMOLITE 1.5 CAL) 1,000 mL (07/14/21 2106)   iron sucrose 100 mg (07/15/21 1043)     apixaban  2.5 mg Per Tube BID   atorvastatin  40 mg Per Tube Daily   chlorhexidine  15 mL Mouth Rinse BID   Chlorhexidine Gluconate Cloth  6 each Topical Daily   darbepoetin (ARANESP) injection - DIALYSIS  200 mcg Intravenous Q Thu-HD   feeding supplement  237 mL Oral BID BM   feeding supplement (PROSource TF)  45 mL Per Tube Daily   gabapentin  100 mg Per Tube Q12H   insulin aspart  0-6 Units Subcutaneous TID WC   lactulose  20 g Per Tube Daily   mouth rinse  15 mL Mouth Rinse q12n4p   midodrine  10 mg Per Tube TID WC   mirtazapine  7.5 mg Per Tube QHS   multivitamin  1 tablet Per Tube QHS   pantoprazole sodium  40 mg Per Tube Daily   sodium chloride flush  10-40 mL Intracatheter Q12H   thiamine  100 mg Per Tube Daily   venlafaxine  37.5 mg Per Tube BID    Dialysis Orders: AF MWF  3h 67mn 400/500    62.5kg    3K/2.5 Ca    P2  AVG HeRO   Hep none - Mircera 50 q 2 wks  (  7/13)  Venofer 50 q wk - Parsabiv '5mg'$  IV q HD   Assessment/Plan: AMS/FTT: Mental status is improving and she is tolerating being in chair during HD. HD done in the bed d/t restraints on 9/13. Pt will have to be able to sit in a chair for 4-6 hrs without restraints to qualify for OP HD w/ our group.  If she continues to tolerate sitting in the chair for dialysis and cooperate, then will re-CLIP the patient as her OP HD unit will not take her back. HD Nurse Coordinator and Renal Navigator following, stated prior to re-clipping her, we need to ensure patient nutritional status is stable.  Melena/Rectal bleeding - GI consulted. Hx rectal ulcers.  EGD 7/27 normal, flex sig with mucosal ulceration c/w sterocoral ulcers, non bleeding. Hgb 8s, stable. ESRD: HD TTS while here. HD today in chair.  K 6.5, dialysis bath adjusted.  Dysphagia -  Patient refusing G-tube placement.  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 AVG clotted 9/2, unable to be declotted d/t arm  contracture. S/P conversion to tunneled HD catheter 9/7 via IR-on Heparin drip, appreciate their assistance.  Hypertension/volume: BP stable, on midodrine '10mg'$  TID with significant overload on exam.  Continue to maximize UF as tolerated, can give albumin with HD for BP support.  UF goal 3.5L today as tolerated.  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  Getting TFs via Core Track-RD managing tube. Refuses G-tube. 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 9. Psych eval 9/14:  It is determined that patient has functional mental capacity for medical decision-making.  10. Anemia: Continue Aranesp-dose increase to 200 mcg q week on 9/15.  Hgb 8.3 today.  11. Dispo: DNR/DNI. Poor prognosis. Palliative care has seen see #1. Appreciate their involvement. Although patient is currently tolerating being in the chair for HD, we do not believe dialysis is adding to her quality of life. Per family communication, family wishes to continue. Patient is refusing G-tube placement which places another barrier because patient cannot be discharged with the [facility] (discussed this with HD coordinator). Ongoing discussions with multi-discipline team-continue CoreTrack with tube feedings.  Jen Mow, PA-C [facility] 07/15/2021,10:59 AM  LOS: 56 days

## 2021-07-15 NOTE — Progress Notes (Signed)
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  She had altered mental status seen by neurology MRI similar to prior MRI at [facility] with a large right MCA stroke, seen by nephrology for dialysis need.  Patient having ongoing confusion weakness inability to sit in dialysis recliner and so not accepted back to outpatient dialysis at this time and awaiting further improvement.  She has had poor intake failure to thrive family wanted PEG tube I had tried but was unable to place because of overlying transverse colon and has been on core track NG tube feeding.  Seen by surgery for G-tube placement.  Core track was clogged and changed 07/07/21. 9/13-psych consulted for competency and patient does have functional mental capacity for medical decision and has refused G tube.    Subjective: Seen this morning in dialysis is alert awake follows simple commands pleasantly confused. Talks about cat in the room Offers no new complaints  Assessment & Plan:  Acute on chronic lower GI bleeding from stercoral ulcer-resolved.S/P EGD and flex sigmoidoscopy.    Anemia of chronic renal disease: hb stable. 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  Continue fall precaution supportive care  Failure to thrive/generalized deconditioning, poor oral intake Hypoglycemia-resolved  Severe protein calorie malnutrition Dysphagia ruled out speech eval 8/25 patient was able to eat well but unwilling to take po."  Tube feeding with "currently , augment nutrition,RD following.  She has declined feeding G tube.  Patient's family in favor of feeding tube.  ESRD on HD MWF on outpatient currently on TTS, TDC placed 9/7.  Continue inpatient as per nephrology.  Per nephrology not a candidate for continued outpatient HD Hyperkalemia - k bath adjust per nephro  Constipation-resolved  Hypotension cont midodrine  Type 2 diabetes mellitus with episodes of hypoglycemia A1c 5.1, stable sugar  Chronic thrombus left upper extremity  cont on eliquis  GERD -cont ppi  Stage II mid coccyx pressure injury  Goals of care currently DNR family son is available over the phone but at times difficult to reach.  Son is coming to talk to the patient regarding goals of care/food intake issues.  Overall prognosis guarded to poor seen by palliative care.  Diet Order             Diet regular Room service appropriate? Yes; Fluid consistency: Thin  Diet effective now                   Nutrition Problem: Severe Malnutrition Etiology: chronic illness (ESRD on HD, prior CVA) Signs/Symptoms: percent weight  loss, severe muscle depletion, energy intake < or equal to 75% for > or equal to 1 month Percent weight loss: 16.4 % Interventions: Nepro shake, Magic cup, MVI Patient's Body mass index is 35.53 kg/m.  DVT prophylaxis: apixaban (ELIQUIS) tablet 2.5 mg Start: 07/09/21 1000 Place TED hose Start: 05/19/21 1706 Code Status:   Code Status: DNR   Family Communication: plan of care discussed with patient at bedside and with team in Mdr meeting. So was called 9/19  Remains inpatient appropriate because:Inpatient level of care appropriate due to severity of illness  Dispo: The patient is from: Home              Anticipated d/c is to: SNF              Patient currently is not medically stable   Difficult to place patient Yes Unresulted Labs (From admission, onward)    None       Medications reviewed:  Scheduled Meds:  apixaban  2.5 mg Per Tube BID   atorvastatin  40 mg Per Tube Daily   chlorhexidine  15 mL Mouth Rinse BID   Chlorhexidine Gluconate Cloth  6 each Topical Daily   darbepoetin (ARANESP) injection - DIALYSIS  200 mcg Intravenous Q Thu-HD   feeding supplement  237 mL Oral BID BM   feeding supplement (PROSource TF)  45 mL Per Tube Daily   gabapentin  100 mg Per Tube Q12H   insulin aspart  0-6 Units Subcutaneous TID WC   lactulose  20 g Per Tube Daily   mouth rinse  15 mL Mouth Rinse q12n4p   midodrine  10 mg Per Tube TID WC   mirtazapine  7.5 mg Per Tube QHS   multivitamin  1 tablet Per Tube QHS   pantoprazole sodium  40 mg Per Tube Daily   sodium chloride flush  10-40 mL Intracatheter Q12H   thiamine  100 mg Per Tube Daily   venlafaxine  37.5 mg Per Tube BID   Continuous Infusions:  feeding supplement (OSMOLITE 1.5 CAL) 1,000 mL (07/14/21 2106)   iron sucrose 100 mg (07/15/21 1043)   Consultants:see note  Procedures:see note Antimicrobials: Anti-infectives (From admission, onward)    Start     Dose/Rate Route Frequency Ordered Stop   07/02/21 1116  ceFAZolin (ANCEF) IVPB 2g/100 mL premix        over 30 Minutes Intravenous Continuous PRN 07/02/21 1139 07/02/21 1116   07/02/21 1111  ceFAZolin (ANCEF) 2-4 GM/100ML-% IVPB       Note to Pharmacy: Domenick Bookbinder   : cabinet override      07/02/21 1111 07/02/21 2314   07/01/21 1215  ceFAZolin (ANCEF) IVPB 2g/100 mL premix        2 g 200 mL/hr over 30  Minutes Intravenous On call 07/01/21 0902 07/02/21 1215   05/29/21 1622  ceFAZolin (ANCEF) 2-4 GM/100ML-% IVPB       Note to Pharmacy: Lytle Butte   : cabinet override      05/29/21 1622 05/30/21 0429   05/29/21 0000  cefTRIAXone (ROCEPHIN) 1 g in sodium chloride 0.9 % 100 mL IVPB  Status:  Discontinued        1 g 200 mL/hr over 30 Minutes Intravenous Every 24 hours 05/28/21 1801 05/28/21 1801   05/28/21 1830  cefTRIAXone (ROCEPHIN) 1 g in sodium chloride 0.9 % 100 mL IVPB        1 g 200 mL/hr over 30 Minutes Intravenous Every 24 hours 05/28/21 1801  05/30/21 1907   05/28/21 1630  ceFAZolin (ANCEF) IVPB 2g/100 mL premix        2 g 200 mL/hr over 30 Minutes Intravenous To Radiology 05/28/21 1537 05/29/21 1630      Culture/Microbiology    Component Value Date/Time   SDES BLOOD RIGHT ANTECUBITAL 06/26/2021 1234   SPECREQUEST  06/26/2021 1234    BOTTLES DRAWN AEROBIC AND ANAEROBIC Blood Culture adequate volume   CULT  06/26/2021 1234    NO GROWTH 5 DAYS Performed at [facility], [place] [address] [place], [place] [address]    REPTSTATUS 07/01/2021 FINAL 06/26/2021 1234    Other culture-see note  Objective: Vitals: Today's Vitals   07/15/21 1030 07/15/21 1100 07/15/21 1142 07/15/21 1231  BP: (!) 112/57 (!) 130/49 (!) 146/61 104/78  Pulse: (!) 118 96 (!) 110 100  Resp:   18 18  Temp:   98.1 F (36.7 C) 98.4 F (36.9 C)  TempSrc:   Axillary Axillary  SpO2:   99% 98%  Weight:      Height:      PainSc:   0-No pain     Intake/Output Summary (Last 24 hours) at 07/15/2021 1302 Last data filed at 07/15/2021 1234 Gross per 24 hour  Intake 775.83 ml  Output 3501 ml  Net -2725.17 ml    Filed Weights   07/14/21 2107 07/15/21 0500 07/15/21 0753  Weight: 103 kg 103 kg 102.9 kg   Weight change:   Intake/Output from previous day: 09/19 0701 - 09/20 0700 In: 775.8 [NG/GT:775.8] Out: 1 [Stool:1] Intake/Output this shift: Total I/O In: 0  Out: 3500 [Other:3500] Filed  Weights   07/14/21 2107 07/15/21 0500 07/15/21 0753  Weight: 103 kg 103 kg 102.9 kg   Examination: General exam: AAOx to self, DOB, pleasantly confused HEENT:Oral mucosa moist, Ear/Nose WNL grossly, dentition normal. Respiratory system: bilaterally clear breath sounds, no use of accessory muscle Cardiovascular system: S1 & S2 +, No JVD,. Gastrointestinal system: Abdomen soft,NT,ND, BS+ Nervous System:Alert, awake, moving extremities left-sided spastic paralysis  Extremities: Swollen bilateral upper extremity chronic, distal peripheral pulses palpable.  Skin: No rashes,no icterus. MSK: Normal muscle bulk,tone, power  HD catheter in the chest  Data Reviewed: I have personally reviewed following labs and imaging studies CBC: Recent Labs  Lab 07/09/21 0351 07/10/21 1532 07/14/21 0709 07/15/21 0812  WBC 9.5 12.5* 11.8* 11.0*  HGB 8.6* 8.3* 8.2* 8.3*  HCT 27.9* 26.8* 28.1* 27.9*  MCV 95.2 95.7 100.7* 100.0  PLT 186 234 272 [id number]    Basic Metabolic Panel: Recent Labs  Lab 07/09/21 0351 07/10/21 1532 07/11/21 1123 07/15/21 0812  NA 131* 125* 131* 130*  K 4.3 5.0 4.4 6.5*  CL 95* 89* 94* 93*  CO2 '27 26 30 29  '$ GLUCOSE 204* 165* 129* 137*  BUN 16 28* 18 39*  CREATININE 2.61* 3.32* 2.40* 3.51*  CALCIUM 8.3* 7.9* 8.3* 8.4*  PHOS 2.4* 2.2* 1.9* 2.4*    GFR: Estimated Creatinine Clearance: 17.6 mL/min (A) (by C-G formula based on SCr of 3.51 mg/dL (H)). Liver Function Tests: Recent Labs  Lab 07/09/21 0351 07/10/21 1532 07/11/21 1123 07/15/21 0812  ALBUMIN 2.2* 1.9* 2.0* 1.8*    No results for input(s): LIPASE, AMYLASE in the last 168 hours. No results for input(s): AMMONIA in the last 168 hours. Coagulation Profile: No results for input(s): INR, PROTIME in the last 168 hours. Cardiac Enzymes: No results for input(s): CKTOTAL, CKMB, CKMBINDEX, TROPONINI in the last 168 hours. BNP (last 3 results)  No results for input(s): PROBNP in the last 8760 hours. HbA1C: No  results for input(s): HGBA1C in the last 72 hours. CBG: Recent Labs  Lab 07/14/21 1131 07/14/21 1612 07/14/21 2105 07/15/21 0644 07/15/21 1231  GLUCAP 132* 152* 177* 122* 164*    Lipid Profile: No results for input(s): CHOL, HDL, LDLCALC, TRIG, CHOLHDL, LDLDIRECT in the last 72 hours. Thyroid Function Tests: No results for input(s): TSH, T4TOTAL, FREET4, T3FREE, THYROIDAB in the last 72 hours. Anemia Panel: No results for input(s): VITAMINB12, FOLATE, FERRITIN, TIBC, IRON, RETICCTPCT in the last 72 hours. Sepsis Labs: No results for input(s): PROCALCITON, LATICACIDVEN in the last 168 hours.  No results found for this or any previous visit (from the past 240 hour(s)).     Radiology Studies: No results found.   LOS: 38 days   Antonieta Pert, MD [facility]  07/15/2021, 1:02 PM

## 2021-07-15 NOTE — Progress Notes (Signed)
No family at bedside for lunch. Pt refused hospital food.

## 2021-07-16 DIAGNOSIS — K625 Hemorrhage of anus and rectum: Secondary | ICD-10-CM

## 2021-07-16 DIAGNOSIS — F05 Delirium due to known physiological condition: Secondary | ICD-10-CM | POA: Diagnosis not present

## 2021-07-16 DIAGNOSIS — N186 End stage renal disease: Secondary | ICD-10-CM | POA: Diagnosis not present

## 2021-07-16 DIAGNOSIS — R638 Other symptoms and signs concerning food and fluid intake: Secondary | ICD-10-CM | POA: Diagnosis not present

## 2021-07-16 DIAGNOSIS — E43 Unspecified severe protein-calorie malnutrition: Secondary | ICD-10-CM | POA: Diagnosis not present

## 2021-07-16 DIAGNOSIS — R627 Adult failure to thrive: Secondary | ICD-10-CM | POA: Diagnosis not present

## 2021-07-16 LAB — GLUCOSE, CAPILLARY
Glucose-Capillary: 167 mg/dL — ABNORMAL HIGH (ref 70–99)
Glucose-Capillary: 162 mg/dL — ABNORMAL HIGH (ref 70–99)
Glucose-Capillary: 147 mg/dL — ABNORMAL HIGH (ref 70–99)
Glucose-Capillary: 140 mg/dL — ABNORMAL HIGH (ref 70–99)

## 2021-07-16 MED ORDER — OLANZAPINE 5 MG PO TABS
2.5000 mg | ORAL_TABLET | Freq: Every day | ORAL | Status: DC
  Administered 2021-07-16 – 2021-07-20 (×5): 2.5 mg
  Filled 2021-07-16 (×5): qty 1

## 2021-07-16 MED ORDER — CHLORHEXIDINE GLUCONATE CLOTH 2 % EX PADS: 6.0000 | MEDICATED_PAD | Freq: Every day | CUTANEOUS | Status: DC

## 2021-07-16 NOTE — Progress Notes (Signed)
[facility] PROGRESS NOTE  Amy [name] [id number] DOB: May 27, 1946 DOA: 05/19/2021 PCP: [name], MD  Status:  Remains inpatient appropriate because:Unsafe d/c plan, IV treatments appropriate due to intensity of illness or inability to take PO, and Inpatient level of care appropriate due to severity of illness  Dispo: The patient is from: Home              Anticipated d/c is to: SNF              Patient currently is medically stable to d/c.   Difficult to place patient Yes              Barriers to discharge: Dialysis patient with markedly decreased mobility-we will need to prove tolerance of sitting in chair for hemodialysis while here as well as tolerating sitting in wheelchair for up to 1 hour x 2 to replicate transport process.  Cannot discharge and proceed with outpatient hemodialysis unless this is accomplished.    Level of care: Med-Surg  Code Status: DNR Family communication: Spoke with son Amy [name] on 9/21.  He was not able to convince his mother to except a gastrostomy tube.  I spoke with him again today and given concerns over possible changes in mentation I asked psychiatry to reevaluate patient's capacity.  Amy [name] was in agreement.  He still would prefer that his mother have a gastrostomy tube.  Although this can be accomplished legally without patient's permission it is suspected that the patient would actually try to pull out this tube therefore warranting bilateral mittens which are considered a restraint device which would preclude discharge to nursing facility.  I also explained that her surgical procedure is very high risk and given the fact that his mom does not want the tube and could potentially pull the tube out surgery it might be reluctant to proceed with surgical procedure.  I also discussed with him that if she is found to have capacity and he cannot talk her into a tube the best course of action would be to discharge her to a facility and see if  she will start to eat.  If she is not eating my recommendation was to focus on comfort and eventually stop hemodialysis and allow a natural death.  At this time the son wants to talk to his mother again to see if he can talk her into a tube and we plan to talk again later in the week. DVT prophylaxis: Eliquis currently on hold secondary to need for placement of [place] vaccination status: [name] 02/26/2020, Pfizer 04/10/2021    HPI: 75 y.o. 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  Patient presented to the ED on 7/25 with dark stool.  On presentation, hemoglobin was 9.6; GI was consulted.  She underwent EGD which was normal and flexible sigmoidoscopy which showed nonbleeding distal rectal ulcers/stercoral ulcers.  Subsequently, GI signed off.  Neurology was also consulted for altered mental status; MRI brain was similar to prior MRI at [facility] with a large right MCA stroke.   Nephrology has been following for dialysis needs; nephrology is stating that patient will not be accepted back to outpatient dialysis at this time due to her underlying confusion, weakness and inability to sit in  a dialysis recliner as an outpatient.   Due to her poor oral intake, family wanted a PEG tube placement.  IR tried but was unable to place because of overlying transverse colon.   General surgery was consulted for G-tube placement but patient does not want a G-tube and recently was deemed to have capacity by psych team.  Son wants patient to have tube since she has a history of not eating well.  Patient having waxing and waning mentation so we have asked psychiatric to see patient again as of 9/21.  See above  documentation and my conversation with the son on 9/21 regarding risks of placing gastrostomy tube against patient's wishes as well as risk for open gastrostomy tube placement.  My recommendation was to send patient back to facility and if she stops eating or refuses to eat make her comfort care and stop dialysis.  Subjective: Patient awake and pleasant.  Began discussing feeding tube and asked her if she spoke with her son at the bedside.  She stated no he has not been by.  She told me that she did not want "that thing in her stomach".  I explained to her that she is not eating regularly and I know that she does not like hospital food but it is unreasonable to expect her family to provide her with home-cooked meals 3 times a day 7 days a week.  She states "I promise I will eat" into the sun this is a cyclic problem and she was not eating prior to this current presentation.  I again reminded the patient that if she does not eat she will get dehydrated and sick and will end up in the hospital.  Again she reiterates that she will eat.  Objective: Vitals:   07/15/21 2048 07/16/21 0436  BP: (!) 143/57 (!) 154/67  Pulse: (!) 101 (!) 101  Resp: 18 16  Temp: 98.3 F (36.8 C) 98 F (36.7 C)  SpO2: 100% 100%    Intake/Output Summary (Last 24 hours) at 07/16/2021 0820 Last data filed at 07/16/2021 0500 Gross per 24 hour  Intake 1659.17 ml  Output 3500 ml  Net -1840.83 ml   Filed Weights   07/15/21 0753 07/15/21 1556 07/16/21 0723  Weight: 102.9 kg 66.7 kg 100.7 kg    Exam:  Constitutional: NAD, calm, alert Respiratory: Clear to auscultation with no increased work of breathing, room air, pulse oximetry study Cardiovascular: S1-S2, no JVD, no peripheral edema, regular pulse and normotensive Abdomen: Soft and nondistended with normoactive bowel sounds.[name] LBM 9/19 cortrack for feeding-no oral intake and is refusing all hospital food Neurologic: CN 2-12 grossly intact -Strength 3/5 on the right.  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    Assessment/Plan: Acute problems: Acute on chronic lower GI bleeding from stercoral ulcers -Patient presented with anemia,  underwent EGD which was normal and flex sigmoidoscopy with clean base, nonbleeding distal rectal ulcers (stercoral ulcers). Gastroenterology now signed off  -Hemoglobin stable (7-8).     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 Continue Lipitor (lipid pnl/HgbA1c WNL) Diet changed to regular and patient is allowed outside food. Patient evaluated psychiatric team this admission and determined to have capacity.  Given issues related to  waxing and waning mentation and delirium symptoms especially at night have asked psych to reevaluate capacity as of 9/21  Acute delirium Psychiatry has reevaluated the patient and tentatively suspect she has capacity but agrees that she may not fully understand the implications of not pursuing a feeding tube and not eating at the same time. They have added olanzapine at bedtime to help with appetite and nocturnal delirium symptoms  ESRD on HD Patient has tolerated dialysis up in a chair which is a requirement to resume outpatient hemodialysis I have discussed with the patient's son eventual stopping hemodialysis if feeding tube not placed and if patient continues to eat poorly.  Emphasized focus on comfort and what his mother wants.  Hyperkalemia/hypophosphatemia Management per nephrology HD -supplementation as needed   Hypotension Blood pressure currently is stable on midodrine.   Failure to thrive /Generalized deconditioning  /very poor oral intake PEG tube was  desired by family but could not be placed because of overlying transverse colon.   Continues to not eat hospital food despite promises that she will eat.  Family has been unable to consistently provide home-cooked meals which patient states she prefers Documented above may need to switch focus to comfort measures honor patient's wishes to not place feeding tube  Constipation Patient was on chronic narcotics prior to admission and these remain in place Continue Chronulac daily  Type 2 diabetes mellitus Episodes of hypoglycemia Hemoglobin A1c 5.1.  Diet controlled at baseline  Chronic thrombus left upper extremity 9/9 will resume Eliquis post TDC placed 9/7 and is functional Elevate as appropriate  Stage II mid coccyx pressure injury, present on admission Continue wound care    GERD Could also be contributing to patient's poor oral intake Increased frequency of Protonix to BID sitter adding Pepcid     Data Reviewed: Basic Metabolic Panel: Recent Labs  Lab 07/10/21 1532 07/11/21 1123 07/15/21 0812  NA 125* 131* 130*  K 5.0 4.4 6.5*  CL 89* 94* 93*  CO2 '26 30 29  '$ GLUCOSE 165* 129* 137*  BUN 28* 18 39*  CREATININE 3.32* 2.40* 3.51*  CALCIUM 7.9* 8.3* 8.4*  PHOS 2.2* 1.9* 2.4*   Liver Function Tests: Recent Labs  Lab 07/10/21 1532 07/11/21 1123 07/15/21 0812  ALBUMIN 1.9* 2.0* 1.8*   No results for input(s): LIPASE, AMYLASE in the last 168 hours. No results for input(s): AMMONIA in the last 168 hours. CBC: Recent Labs  Lab 07/10/21 1532 07/14/21 0709 07/15/21 0812  WBC 12.5* 11.8* 11.0*  HGB 8.3* 8.2* 8.3*  HCT 26.8* 28.1* 27.9*  MCV 95.7 100.7* 100.0  PLT 234 272 310    CBG: Recent Labs  Lab 07/15/21 0644 07/15/21 1231 07/15/21 1632 07/15/21 2051 07/16/21 0639  GLUCAP 122* 164* 169* 179* 167*     Scheduled Meds:  apixaban  2.5 mg Per Tube BID   atorvastatin  40 mg Per Tube Daily   chlorhexidine  15 mL Mouth Rinse BID   Chlorhexidine  Gluconate Cloth  6 each Topical Daily   darbepoetin (ARANESP) injection - DIALYSIS  200 mcg Intravenous Q Thu-HD   feeding supplement  237 mL Oral BID BM   feeding supplement (PROSource TF)  45 mL Per Tube Daily   gabapentin  100 mg Per Tube Q12H   insulin aspart  0-6 Units Subcutaneous TID WC   lactulose  20 g Per Tube Daily   mouth rinse  15 mL Mouth Rinse q12n4p   midodrine  10 mg Per Tube TID WC  mirtazapine  7.5 mg Per Tube QHS   multivitamin  1 tablet Per Tube QHS   pantoprazole sodium  40 mg Per Tube Daily   sodium chloride flush  10-40 mL Intracatheter Q12H   thiamine  100 mg Per Tube Daily   venlafaxine  37.5 mg Per Tube BID   Continuous Infusions:  feeding supplement (OSMOLITE 1.5 CAL) 50 mL/hr at 07/16/21 0500   iron sucrose 100 mg (07/15/21 1043)    Principal Problem:   GI bleed Active Problems:   Essential hypertension   Type 2 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, with long-term current use of insulin (HCC)   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   ESRD on dialysis ([place])   Dyslipidemia   Hypokalemia   Stage 2 skin ulcer of sacral region [facility])   Cerebral thrombosis with cerebral infarction   Protein-calorie malnutrition, severe   Acute GI bleeding   Oropharyngeal dysphagia   Failure to thrive in adult   Inadequate oral intake   Clogged feeding tube   Fever   Consultants: Vascular surgery Nephrology Gastroenterology Neurology Palliative medicine General surgery  Procedures: EEG Echocardiogram Cortrack EGD  Antibiotics: Ceftriaxone 8/3 through 8/5   Time spent: 25 minutes    Erin Hearing ANP  [facility] 7 am - 330 pm/M-F for direct patient care and secure chat Please refer to Amion for contact info 57  days

## 2021-07-16 NOTE — Progress Notes (Addendum)
[facility] Progress Note   Subjective:   Patient seen and examined at bedside.  Alert today but oriented to self and year only.  Believes she is in a rehab in [place] and argues when told she is at [facility] in [place].  Encouraged nutrition but reports "I don't like the food from this kitchen."  Denies CP, SOB, n/v and abdominal pain.  Reports dialysis went well yesterday, "it was not painful like I expected."  Objective Vitals:   07/15/21 2048 07/16/21 0436 07/16/21 0723 07/16/21 0941  BP: (!) 143/57 (!) 154/67  (!) 155/69  Pulse: (!) 101 (!) 101  99  Resp: '18 16  18  '$ Temp: 98.3 F (36.8 C) 98 F (36.7 C)  98.6 F (37 C)  TempSrc: Oral Oral  Axillary  SpO2: 100% 100%  100%  Weight:   100.7 kg   Height:       Physical Exam General:chronically ill appearing female in NAD Heart:RRR, no mrg Lungs:CTAB anterolaterally  Abdomen:soft, NTND Extremities:1+ edema in hips/thighs and UE b/l Dialysis Access: [facility]   Filed Weights   07/15/21 0753 07/15/21 1556 07/16/21 0723  Weight: 102.9 kg 66.7 kg 100.7 kg    Intake/Output Summary (Last 24 hours) at 07/16/2021 1055 Last data filed at 07/16/2021 0500 Gross per 24 hour  Intake 1659.17 ml  Output 3500 ml  Net -1840.83 ml    Additional Objective Labs: Basic Metabolic Panel: Recent Labs  Lab 07/10/21 1532 07/11/21 1123 07/15/21 0812  NA 125* 131* 130*  K 5.0 4.4 6.5*  CL 89* 94* 93*  CO2 '26 30 29  '$ GLUCOSE 165* 129* 137*  BUN 28* 18 39*  CREATININE 3.32* 2.40* 3.51*  CALCIUM 7.9* 8.3* 8.4*  PHOS 2.2* 1.9* 2.4*   Liver Function Tests: Recent Labs  Lab 07/10/21 1532 07/11/21 1123 07/15/21 0812  ALBUMIN 1.9* 2.0* 1.8*   CBC: Recent Labs  Lab 07/10/21 1532 07/14/21 0709 07/15/21 0812  WBC 12.5* 11.8* 11.0*  HGB 8.3* 8.2* 8.3*  HCT 26.8* 28.1* 27.9*  MCV 95.7 100.7* 100.0  PLT 234 272 310   CBG: Recent Labs  Lab 07/15/21 0644 07/15/21 1231 07/15/21 1632 07/15/21 2051 07/16/21 0639   GLUCAP 122* 164* 169* 179* 167*   Medications:  feeding supplement (OSMOLITE 1.5 CAL) 50 mL/hr at 07/16/21 0500   iron sucrose 100 mg (07/15/21 1043)    apixaban  2.5 mg Per Tube BID   atorvastatin  40 mg Per Tube Daily   chlorhexidine  15 mL Mouth Rinse BID   Chlorhexidine Gluconate Cloth  6 each Topical Daily   darbepoetin (ARANESP) injection - DIALYSIS  200 mcg Intravenous Q Thu-HD   feeding supplement  237 mL Oral BID BM   feeding supplement (PROSource TF)  45 mL Per Tube Daily   gabapentin  100 mg Per Tube Q12H   insulin aspart  0-6 Units Subcutaneous TID WC   lactulose  20 g Per Tube Daily   mouth rinse  15 mL Mouth Rinse q12n4p   midodrine  10 mg Per Tube TID WC   mirtazapine  7.5 mg Per Tube QHS   multivitamin  1 tablet Per Tube QHS   pantoprazole sodium  40 mg Per Tube Daily   sodium chloride flush  10-40 mL Intracatheter Q12H   thiamine  100 mg Per Tube Daily   venlafaxine  37.5 mg Per Tube BID    Dialysis Orders: AF MWF  3h 54mn 400/500    62.5kg  3K/2.5 Ca    P2  AVG HeRO   Hep none - Mircera 50 q 2 wks  (7/13)  Venofer 50 q wk - Parsabiv '5mg'$  IV q HD   Assessment/Plan: AMS/FTT: Mental status is improving and she is tolerating being in chair during HD. HD done in the bed d/t restraints on 9/13. Pt will have to be able to sit in a chair for 4-6 hrs without restraints to qualify for OP HD w/ our group.  If she continues to tolerate sitting in the chair for dialysis and cooperate, then will re-CLIP the patient as her OP HD unit will not take her back. HD Nurse Coordinator and Renal Navigator following, stated prior to re-clipping her, we need to ensure patient nutritional status is stable.  Melena/Rectal bleeding - GI consulted. Hx rectal ulcers.  EGD 7/27 normal, flex sig with mucosal ulceration c/w sterocoral ulcers, non bleeding. Hgb 8s, stable. ESRD: HD TTS while here. Next HD tomorrow.  Dysphagia -  Patient refusing G-tube placement.  Access Issues/AVG clotted:  Had f'gram  6/23 which showed chronic thrombus and was started on Eliquis. AVG clotted 9/2, unable to be declotted d/t arm contracture. S/P conversion to tunneled HD catheter 9/7 via IR-on Heparin drip, appreciate their assistance.  Hypertension/volume: BP stable, on midodrine '10mg'$  TID with significant overload on exam.  Continue to maximize UF as tolerated, can give albumin with HD for BP support.   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  Getting TFs via Core Track-RD managing tube. Refuses G-tube. 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 9. Psych eval 9/14:  It is determined that patient has functional mental capacity for medical decision-making. Psych evaluating again today.  10. Anemia: Continue Aranesp-dose increase to 200 mcg q week on 9/15.  Hgb 8.3 yesterday.  11. Dispo: DNR/DNI. Poor prognosis. Palliative care has seen see #1. Appreciate their involvement. Although patient is currently tolerating being in the chair for HD, we do not believe dialysis is adding to her quality of life. Per family communication, family will consider stopping HD if patient continues to refuse eating and G tube placement.  Patient is refusing G-tube placement which places another barrier because patient cannot be discharged with the [facility] (discussed this with HD coordinator). Ongoing discussions with multi-discipline team-continue CoreTrack with tube feedings.   Jen Mow, PA-C [facility] 07/16/2021,10:55 AM  LOS: 57 days

## 2021-07-16 NOTE — Consult Note (Signed)
Reason for Consult: Reevaluate capacity. Patient has been intermittently hallucinating, still not eating Referring Physician: Samella Parr, NP  Amy [name] is an 75 y.o. female.  HPI:  75 y.o. 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  Patient presented to the ED on 7/25 with dark stool.  On presentation, hemoglobin was 9.6; GI was consulted.  She underwent EGD which was normal and flexible sigmoidoscopy which showed nonbleeding distal rectal ulcers/stercoral ulcers.  Subsequently, GI signed off.  Neurology was also consulted for altered mental status; MRI brain was similar to prior MRI at [facility] with a large right MCA stroke.   Today patient is lying in bed staring off into the corner of the room.  She reports that she is doing okay.  She reports that she is sleeping well.  When asked if she is eating she continues to say that she does not like the food here and will not eat but she reported that she did have some of her breakfast the bacon (when confirming with nurse, nurse reports none of her breakfast was eaten).  She reports no SI, HI, or AVH.  Is oriented to person only.  She repeatedly insisted that we were in [place] despite multiple corrections.  She also thought that it was October in the 1900s.  Discussed with her that we were there to see her to evaluate her capacity and as soon as I mentioned a PEG tube she immediately and very clearly stated-she did not want a PEG tube she understands that if she does not eat and does not like the surgery that she would die and that she was okay with this.  She reported not wanting to die and that she would eat if food was brought into her,  however, she was not going to eat the hospital food.  She reported that her niece was coming today with [facility] and that she would eat that.  When asked why she was wearing a mit on her right hand she reported that it was because she wanted to and that she had put it on.  Her toe nails had been recently painted and when asked who had done it she initially denied that they had been but then said it must have been her niece who had done it.  Had a long in-depth discussion about her needing to eat and how important it was.  Asked her when she had last been out of bed and she reported that she did not remember when.  Discussed with her that we would recommend PT/OT coming back to see her as her getting up and out of bed should stimulate her hunger and help her start eating again.  She reported that her goal was to discharge home to live with her sister who she reports lives next to her mother.  She reports that they would be willing to assist her once she is discharged.  She reported no other concerns at present.   Past Medical History:  Diagnosis Date   Chronic kidney disease    dialysis M-W-F - dialysis cath located in right side of chest   Diabetes mellitus without complication ([place])    type 2 - no meds    HLD (hyperlipidemia)  diet controlled   Hypertension    no meds    Past Surgical History:  Procedure Laterality Date   A/V FISTULAGRAM Left 01/10/2021   Procedure: A/V FISTULAGRAM;  Surgeon: Angelia Mould, MD;  Location: [facility];  Service: Cardiovascular;  Laterality: Left;   A/V FISTULAGRAM N/A 01/27/2021   Procedure: A/V FISTULAGRAM;  Surgeon: Waynetta Sandy, MD;  Location: [place] [facility];  Service: Cardiovascular;  Laterality: N/A;   AV FISTULA PLACEMENT Left 09/24/2020   Procedure: LEFT UPPER ARM ARTERIOVENOUS GORTEX  GRAFT;  Surgeon: Elam Dutch, MD;  Location: [place];  Service: Vascular;  Laterality: Left;   BREAST CYST EXCISION Right 2011   cyst  removed -neg   ESOPHAGOGASTRODUODENOSCOPY (EGD) WITH PROPOFOL N/A 05/21/2021   Procedure: ESOPHAGOGASTRODUODENOSCOPY (EGD) WITH PROPOFOL;  Surgeon: Jackquline Denmark, MD;  Location: [facility] ENDOSCOPY;  Service: Endoscopy;  Laterality: N/A;   EYE SURGERY Bilateral    cataracts   FLEXIBLE SIGMOIDOSCOPY N/A 05/21/2021   Procedure: FLEXIBLE SIGMOIDOSCOPY;  Surgeon: Jackquline Denmark, MD;  Location: [facility] ENDOSCOPY;  Service: Endoscopy;  Laterality: N/A;   INSERTION OF DIALYSIS CATHETER Right 07/2019   IR FLUORO GUIDE CV LINE LEFT  06/27/2021   IR FLUORO GUIDE CV LINE LEFT  07/02/2021   IR GASTROSTOMY TUBE MOD SED  05/29/2021   IR US GUIDE VASC ACCESS LEFT  06/27/2021   PERIPHERAL VASCULAR BALLOON ANGIOPLASTY  01/10/2021   Procedure: PERIPHERAL VASCULAR BALLOON ANGIOPLASTY;  Surgeon: Angelia Mould, MD;  Location: [place] [facility];  Service: Cardiovascular;;  left AV graft   TUBAL LIGATION     Ultrasound-guided cannulation left upper arm AV graft  01/27/2021    Family History  Problem Relation Age of Onset   Breast cancer Neg Hx     Social History:  reports that she has quit smoking. She has never used smokeless tobacco. She reports current alcohol use. She reports that she does not currently use drugs after having used the following drugs: Marijuana.  Allergies:  Allergies  Allergen Reactions   Sensipar [Cinacalcet]     Per MAR   Statins     Per MAR    Medications: I have reviewed the patient's current medications.  Results for orders placed or performed during the hospital encounter of 05/19/21 (from the past 48 hour(s))  Glucose, capillary     Status: Abnormal   Collection Time: 07/14/21 11:31 AM  Result Value Ref Range   Glucose-Capillary 132 (H) 70 - 99 mg/dL    Comment: Glucose reference range applies only to samples taken after fasting for at least 8 hours.  Glucose, capillary     Status: Abnormal   Collection Time: 07/14/21  4:12 PM  Result Value Ref Range   Glucose-Capillary 152 (H)  70 - 99 mg/dL    Comment: Glucose reference range applies only to samples taken after fasting for at least 8 hours.  Glucose, capillary     Status: Abnormal   Collection Time: 07/14/21  9:05 PM  Result Value Ref Range   Glucose-Capillary 177 (H) 70 - 99 mg/dL    Comment: Glucose reference range applies only to samples taken after fasting for at least 8 hours.  Glucose, capillary     Status: Abnormal   Collection Time: 07/15/21  6:44 AM  Result Value Ref Range   Glucose-Capillary 122 (H) 70 - 99 mg/dL    Comment: Glucose reference range applies only to samples taken after fasting for at least 8 hours.  Renal function panel     Status: Abnormal   Collection Time: 07/15/21  8:12 AM  Result Value Ref Range   Sodium 130 (L) 135 - 145 mmol/L   Potassium 6.5 (HH) 3.5 - 5.1 mmol/L    Comment: NO VISIBLE HEMOLYSIS CRITICAL RESULT CALLED TO, READ BACK BY AND VERIFIED WITH: Foye Deer, RN 0900 07/15/21 L. KLAR    Chloride 93 (L) 98 - 111 mmol/L   CO2 29 22 - 32 mmol/L   Glucose, Bld 137 (H) 70 - 99 mg/dL    Comment: Glucose reference range applies only to samples taken after fasting for at least 8 hours.   BUN 39 (H) 8 - 23 mg/dL   Creatinine, Ser 3.51 (H) 0.44 - 1.00 mg/dL   Calcium 8.4 (L) 8.9 - 10.3 mg/dL   Phosphorus 2.4 (L) 2.5 - 4.6 mg/dL   Albumin 1.8 (L) 3.5 - 5.0 g/dL   GFR, Estimated 13 (L) >60 mL/min    Comment: (NOTE) Calculated using the CKD-EPI Creatinine Equation (2021)    Anion gap 8 5 - 15    Comment: Performed at [facility] [address], [place], [place] [address]  CBC     Status: Abnormal   Collection Time: 07/15/21  8:12 AM  Result Value Ref Range   WBC 11.0 (H) 4.0 - 10.5 K/uL   RBC 2.79 (L) 3.87 - 5.11 MIL/uL   Hemoglobin 8.3 (L) 12.0 - 15.0 g/dL   HCT 27.9 (L) 36.0 - 46.0 %   MCV 100.0 80.0 - 100.0 fL   MCH 29.7 26.0 - 34.0 pg   MCHC 29.7 (L) 30.0 - 36.0 g/dL   RDW 20.0 (H) 11.5 - 15.5 %   Platelets 310 150 - 400 K/uL   nRBC 0.0 0.0 - 0.2 %     Comment: Performed at [facility] [address] [place], [place] [address]  Glucose, capillary     Status: Abnormal   Collection Time: 07/15/21 12:31 PM  Result Value Ref Range   Glucose-Capillary 164 (H) 70 - 99 mg/dL    Comment: Glucose reference range applies only to samples taken after fasting for at least 8 hours.  Glucose, capillary     Status: Abnormal   Collection Time: 07/15/21  4:32 PM  Result Value Ref Range   Glucose-Capillary 169 (H) 70 - 99 mg/dL    Comment: Glucose reference range applies only to samples taken after fasting for at least 8 hours.  Glucose, capillary     Status: Abnormal   Collection Time: 07/15/21  8:51 PM  Result Value Ref Range   Glucose-Capillary 179 (H) 70 - 99 mg/dL    Comment: Glucose reference range applies only to samples taken after fasting for at least 8 hours.  Glucose, capillary     Status: Abnormal   Collection Time: 07/16/21  6:39 AM  Result Value Ref Range   Glucose-Capillary 167 (H) 70 - 99 mg/dL    Comment: Glucose reference range applies only to samples taken after fasting for at least 8 hours.    No results found.  Review of Systems  Respiratory:  Negative for shortness of breath.   Cardiovascular:  Negative for chest pain.  Gastrointestinal:  Negative for abdominal pain, constipation, diarrhea, nausea and vomiting.  Psychiatric/Behavioral:  Negative for hallucinations and suicidal ideas. The patient is not nervous/anxious.   Blood pressure (!) 154/67, pulse (!) 101, temperature 98 F (36.7 C), temperature source Oral, resp. rate 16, height  $'5\' 7"'X$  (1.702 m), weight 100.7 kg, SpO2 100 %. Physical Exam Vitals and nursing note reviewed.  Constitutional:      General: She is not in acute distress.    Appearance: She is obese. She is not ill-appearing or toxic-appearing.  HENT:     Head: Normocephalic and atraumatic.  Pulmonary:     Effort: Pulmonary effort is normal.  Musculoskeletal:     Comments: No movement in her  left extremities   Neurological:     Mental Status: She is alert.     Comments: No movement in her left extremities      07/16/21 1532  Presentation  General Appearance Appropriate for Environment;Casual (lying askew in the bed)  Eye Contact None  Speech Clear and Coherent;Normal Rate  Speech Volume Normal  Mood and Affect  Mood Euthymic  Affect Congruent  Thought Processes  Thought Process Disorganized  Descriptions of Associations Loose  Orientation Partial (Only oriented to person)  Thought Content Rumination;Scattered  Hallucinations None  Ideas of Reference None  Suicidal Thoughts No  Homicidal Thoughts No  Sensorium  Memory Immediate Fair  Judgment Poor  Insight Poor  Executive Functions  Concentration Fair  Attention [place]an [place] of Knowledge Fair  Language Fair  Psychomotor Activity  Psychomotor Activity Decreased  Assets  Assets Resilience;Social Support  Sleep  Sleep Fair     Assessment/Plan:  75 y.o. 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  Patient presented to the ED on 7/25 with dark stool.  On presentation, hemoglobin was 9.6; GI was consulted.  She underwent EGD which was normal and flexible sigmoidoscopy which showed nonbleeding distal rectal ulcers/stercoral ulcers.  Subsequently, GI signed off.  Neurology was also consulted for altered mental status; MRI brain was similar to prior MRI at [facility] with a large right MCA stroke.   Today patient is disoriented/delirious.  However, when discussing placing a peg tube patient is still adamant and clear on her wishes to not have one.  Have discussed with her the need for her to  start eating.  Given her reported delusions will recommend starting Zyprexa which should also help increase her appetite.  We will also recommend revolving PT/OT to work with her which should also help increase her appetite.  Will not change the doses of her other psychiatric medications.  Have discussed these recommendations with primary team.   -Start Zyprexa 2.5 mg QHS -Reengage PT/OT with patient   Continue rest of care per Primary Team Psychiatry will continue to follow the patient   Briant Cedar 07/16/2021, 9:10 AM

## 2021-07-16 NOTE — Progress Notes (Addendum)
No family at the bedside or any outside food during lunch. Pt refuse food from cafeteria after one bite.

## 2021-07-17 DIAGNOSIS — I6330 Cerebral infarction due to thrombosis of unspecified cerebral artery: Secondary | ICD-10-CM | POA: Diagnosis not present

## 2021-07-17 DIAGNOSIS — E43 Unspecified severe protein-calorie malnutrition: Secondary | ICD-10-CM | POA: Diagnosis not present

## 2021-07-17 DIAGNOSIS — R627 Adult failure to thrive: Secondary | ICD-10-CM | POA: Diagnosis not present

## 2021-07-17 DIAGNOSIS — K625 Hemorrhage of anus and rectum: Secondary | ICD-10-CM | POA: Diagnosis not present

## 2021-07-17 LAB — CBC
HCT: 29.1 % — ABNORMAL LOW (ref 36.0–46.0)
Hemoglobin: 9.1 g/dL — ABNORMAL LOW (ref 12.0–15.0)
MCH: 30.6 pg (ref 26.0–34.0)
MCHC: 31.3 g/dL (ref 30.0–36.0)
MCV: 98 fL (ref 80.0–100.0)
Platelets: 306 10*3/uL (ref 150–400)
RBC: 2.97 MIL/uL — ABNORMAL LOW (ref 3.87–5.11)
RDW: 18.9 % — ABNORMAL HIGH (ref 11.5–15.5)
WBC: 10.2 10*3/uL (ref 4.0–10.5)
nRBC: 0 % (ref 0.0–0.2)

## 2021-07-17 LAB — RENAL FUNCTION PANEL
Albumin: 1.9 g/dL — ABNORMAL LOW (ref 3.5–5.0)
Anion gap: 8 (ref 5–15)
BUN: 39 mg/dL — ABNORMAL HIGH (ref 8–23)
CO2: 30 mmol/L (ref 22–32)
Calcium: 8.3 mg/dL — ABNORMAL LOW (ref 8.9–10.3)
Chloride: 90 mmol/L — ABNORMAL LOW (ref 98–111)
Creatinine, Ser: 3.18 mg/dL — ABNORMAL HIGH (ref 0.44–1.00)
GFR, Estimated: 15 mL/min — ABNORMAL LOW (ref >60)
Glucose, Bld: 147 mg/dL — ABNORMAL HIGH (ref 70–99)
Phosphorus: 2.7 mg/dL (ref 2.5–4.6)
Potassium: 6.3 mmol/L (ref 3.5–5.1)
Sodium: 128 mmol/L — ABNORMAL LOW (ref 135–145)

## 2021-07-17 LAB — GLUCOSE, CAPILLARY
Glucose-Capillary: 140 mg/dL — ABNORMAL HIGH (ref 70–99)
Glucose-Capillary: 141 mg/dL — ABNORMAL HIGH (ref 70–99)
Glucose-Capillary: 202 mg/dL — ABNORMAL HIGH (ref 70–99)
Glucose-Capillary: 166 mg/dL — ABNORMAL HIGH (ref 70–99)

## 2021-07-17 MED ORDER — LIDOCAINE-PRILOCAINE 2.5-2.5 % EX CREA: 1.0000 "application " | TOPICAL_CREAM | CUTANEOUS | Status: DC | PRN

## 2021-07-17 MED ORDER — HEPARIN SODIUM (PORCINE) 1000 UNIT/ML DIALYSIS
1000.0000 [IU] | INTRAMUSCULAR | Status: DC | PRN
  Administered 2021-07-17: 1000 [IU] via INTRAVENOUS_CENTRAL
  Filled 2021-07-17: qty 1

## 2021-07-17 MED ORDER — PENTAFLUOROPROP-TETRAFLUOROETH EX AERO: 1.0000 "application " | INHALATION_SPRAY | CUTANEOUS | Status: DC | PRN

## 2021-07-17 MED ORDER — SODIUM CHLORIDE 0.9 % IV SOLN: 100.0000 mL | INTRAVENOUS | Status: DC | PRN

## 2021-07-17 MED ORDER — ALTEPLASE 2 MG IJ SOLR: 2.0000 mg | Freq: Once | INTRAMUSCULAR | Status: DC | PRN

## 2021-07-17 MED ORDER — LIDOCAINE HCL (PF) 1 % IJ SOLN: 5.0000 mL | INTRAMUSCULAR | Status: DC | PRN

## 2021-07-17 NOTE — Progress Notes (Signed)
Nutrition Follow-up  DOCUMENTATION CODES:   Severe malnutrition in context of chronic illness  INTERVENTION:   -Continue Ensure Enlive po BID, each supplement provides 350 kcal and 20 grams of protein   Continue TF via Cortrak: -Osmolite 1.5 @ 53m/hr (12041md)  -4556mrosource TF daily   Provides 1840 kcals, 86g protein, 914m6mee water  NUTRITION DIAGNOSIS:   Severe Malnutrition related to chronic illness (ESRD on HD, prior CVA) as evidenced by percent weight loss, severe muscle depletion, energy intake < or equal to 75% for > or equal to 1 month.  Ongoing  GOAL:   Patient will meet greater than or equal to 90% of their needs  Met with TF  MONITOR:   PO intake, Supplement acceptance, Diet advancement, Labs, Weight trends, Skin, I & O's  REASON FOR ASSESSMENT:   Consult Enteral/tube feeding initiation and management  ASSESSMENT:   75 y59female with PMH of ESRD on HD MWF, HTN, HLD, T2DM, CVA s/p TPA 02/2021, ?blood clot in UE, left hemiparesis from CVA who presented to ER for dark stool with + fecal occult.  8/17 - cortrak placed (gastric tip) 8/19 - cortrak replaced (gastric tip) 8/29 - cortrak replaced (gastric tip) 9/07 - s/p tunneled HD cath placement 9/12 - cortrak replaced (gastric tip confirmed via xray); diet advanced to regular  Reviewed I/O's: +682 ml x 24 hours and -2.9 L since 07/03/21  UOP: 0 ml x 24 hours  Calorie count completed from 9/16-9/19/22. Pt continues to consume 0% of meals and refuse nutritional supplements. Family has been encouraged to be present at meals and provide outside food (pt does not like hospital foods), however, their efforts have been inconsistent.   Pt continues to receive TF via cortrak tube and tolerating well. Per MD notes, pt son is in favor of placement g-tube, however, pt currently refusing. Psychiatry following; pt was delirious at time of last visit. Plan to start zyprexa to increase appetite and re-consulting PT/OT.  Per MD notes, if pt continues to refuse to eat may transition to comofrt measures.   Medications reviewed and include aranesp, lactulose, and remeron.   Labs reviewed: CBGS: 140-167 (inpatient orders for glycemic control are 0-6 units insulin aspart TID with meals).    Diet Order:   Diet Order             Diet regular Room service appropriate? Yes; Fluid consistency: Thin  Diet effective now                   EDUCATION NEEDS:   Education needs have been addressed  Skin:  Skin Assessment: Skin Integrity Issues: Skin Integrity Issues:: Stage II Stage II: Pressure Injury on coccyx; Open wound on buttocks  Last BM:  07/15/21  Height:   Ht Readings from Last 1 Encounters:  05/20/21 _0  (1.702 m)    Weight:   Wt Readings from Last 1 Encounters:  07/16/21 100.7 kg   BMI:  Body mass index is 34.77 kg/m.  Estimated Nutritional Needs:   Kcal:  1700-1900  Protein:  85-100 grams  Fluid:  1L +UOP    JeniLoistine Chance, LDN, CDCEMarienvilleistered Dietitian II Certified Diabetes Care and Education Specialist Please refer to AMION for RD and/or RD on-call/weekend/after hours pager

## 2021-07-17 NOTE — Progress Notes (Signed)
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  She had altered mental status seen by neurology MRI similar to prior MRI at [facility] with a large right MCA stroke, seen by nephrology for dialysis need.  Patient having ongoing confusion weakness inability to sit in dialysis recliner and so not accepted back to outpatient dialysis at this time and awaiting further improvement.  She has had poor intake failure to thrive family wanted PEG tube I had tried but was unable to place because of overlying transverse colon and has been on core track NG tube feeding.  Seen by surgery for G-tube placement.  Core track was clogged and changed 07/07/21. 9/13-psych consulted for competency and patient does have functional mental capacity for medical decision and has refused G tube.    Subjective:  Seen this morning in dialysis is alert awake follows simple commands pleasantly confused. Talks about cat in the room Offers no new complaints  Assessment & Plan:  Acute on chronic lower GI bleeding from stercoral ulcer Anemia of chronic renal disease:  S/P EGD and flex sigmoidoscopy.  Hb stable.  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  Continue Eliquis, Lipitor  and supportive care.  Placed on Zyprexa by psychiatry.  Failure to thrive/generalized deconditioning, poor oral intake Hypoglycemia-resolved  Severe protein calorie malnutrition Dysphagia ruled out speech eval 8/25 Patient adamant on not eating hospital food and states "I will eat food from home " Continue to feed, dietitian following, has refused G tube placement  ESRD on HD MWF urgently now on TTS, TDC on 9/7, followed by nephrology Hyperkalemia - k bath adjust per nephro Constipation-resolved Hypotension cont midodrine.  Monitor blood pressure  Type 2 diabetes mellitus with episodes of hypoglycemia A1c 5.1, blood sugar stable Recent Labs  Lab 07/16/21 1152 07/16/21 1626 07/16/21 2052 07/17/21 0633 07/17/21 1341  GLUCAP 162* 147* 140* 140* 141*    Chronic thrombus left upper extremity  cont on eliquis GERD -cont ppi Stage II mid coccyx pressure injury GOC: currently DNR.  Followed by palliative care, Difficulty place list  Diet Order             Diet regular Room service appropriate? Yes; Fluid consistency: Thin  Diet effective now                   Nutrition Problem: Severe Malnutrition Etiology: chronic illness (ESRD on HD, prior CVA) Signs/Symptoms: percent weight loss, severe muscle depletion, energy intake < or equal to 75% for > or equal to 1 month Percent weight loss: 16.4 % Interventions: Nepro shake, Magic cup, MVI Patient's Body mass index is 27.62 kg/m.  DVT prophylaxis: apixaban (ELIQUIS) tablet 2.5 mg Start: 07/09/21 1000 Place TED hose Start: 05/19/21 1706 Code Status:   Code Status: DNR  Family Communication: plan of care discussed during the multidisciplinary  meeting,   Remains inpatient appropriate because:Inpatient level of care appropriate due to severity of illness  Dispo: The patient is from: Home              Anticipated d/c is to: SNF              Patient currently is not medically stable   Difficult to place patient Yes Unresulted  Labs (From admission, onward)    None       Medications reviewed:  Scheduled Meds:  apixaban  2.5 mg Per Tube BID   atorvastatin  40 mg Per Tube Daily   chlorhexidine  15 mL Mouth Rinse BID   Chlorhexidine Gluconate Cloth  6 each Topical Daily   darbepoetin (ARANESP) injection - DIALYSIS  200 mcg Intravenous Q Thu-HD   feeding supplement  237 mL Oral BID BM   feeding supplement (PROSource TF)  45 mL Per Tube Daily   gabapentin  100 mg Per Tube Q12H   insulin aspart  0-6 Units Subcutaneous TID WC   lactulose  20 g Per Tube Daily   mouth rinse  15 mL Mouth Rinse q12n4p   midodrine  10 mg Per Tube TID WC   mirtazapine  7.5 mg Per Tube QHS   multivitamin  1 tablet Per Tube QHS   OLANZapine  2.5 mg Per Tube QHS   pantoprazole sodium  40 mg Per Tube Daily   sodium chloride flush  10-40 mL Intracatheter Q12H   thiamine  100 mg Per Tube Daily   venlafaxine  37.5 mg Per Tube BID   Continuous Infusions:  feeding supplement (OSMOLITE 1.5 CAL) 1,000 mL (07/17/21 0814)   iron sucrose 100 mg (07/17/21 1411)   Consultants:see note  Procedures:see note Antimicrobials: Anti-infectives (From admission, onward)    Start     Dose/Rate Route Frequency Ordered Stop   07/02/21 1116  ceFAZolin (ANCEF) IVPB 2g/100 mL premix        over 30 Minutes Intravenous Continuous PRN 07/02/21 1139 07/02/21 1116   07/02/21 1111  ceFAZolin (ANCEF) 2-4 GM/100ML-% IVPB       Note to Pharmacy: Domenick Bookbinder   : cabinet override      07/02/21 1111 07/02/21 2314   07/01/21 1215  ceFAZolin (ANCEF) IVPB 2g/100 mL premix        2 g 200 mL/hr over 30 Minutes Intravenous On call 07/01/21 0902 07/02/21 1215   05/29/21 1622  ceFAZolin (ANCEF) 2-4 GM/100ML-% IVPB       Note to Pharmacy: Lytle Butte   : cabinet override      05/29/21 1622 05/30/21 0429   05/29/21 0000  cefTRIAXone (ROCEPHIN) 1 g in sodium chloride 0.9 % 100 mL IVPB  Status:  Discontinued        1 g 200 mL/hr over 30 Minutes Intravenous Every 24  hours 05/28/21 1801 05/28/21 1801   05/28/21 1830  cefTRIAXone (ROCEPHIN) 1 g in sodium chloride 0.9 % 100 mL IVPB        1 g 200 mL/hr over 30 Minutes Intravenous Every 24 hours 05/28/21 1801 05/30/21 1907   05/28/21 1630  ceFAZolin (ANCEF) IVPB 2g/100 mL premix        2 g 200 mL/hr over 30 Minutes Intravenous To Radiology 05/28/21 1537 05/29/21 1630      Culture/Microbiology    Component Value Date/Time   SDES BLOOD RIGHT ANTECUBITAL 06/26/2021 1234   SPECREQUEST  06/26/2021 1234    BOTTLES DRAWN AEROBIC AND ANAEROBIC Blood Culture  adequate volume   CULT  06/26/2021 1234    NO GROWTH 5 DAYS Performed at [facility], [place] [address] [place], [place] [address]    REPTSTATUS 07/01/2021 FINAL 06/26/2021 1234    Other culture-see note  Objective: Vitals: Today's Vitals   07/17/21 1130 07/17/21 1200 07/17/21 1244 07/17/21 1641  BP: (!) 113/35 (!) 114/38 (!) 144/72 140/79  Pulse: (!) 104 (!) 105 (!) 110 (!) 106  Resp: '18 18 18 16  '$ Temp:   98.1 F (36.7 C) 98.6 F (37 C)  TempSrc:   Oral   SpO2:   100% 99%  Weight:   80 kg   Height:      PainSc:   0-No pain     Intake/Output Summary (Last 24 hours) at 07/17/2021 1649 Last data filed at 07/17/2021 1244 Gross per 24 hour  Intake 0 ml  Output 2765 ml  Net -2765 ml    Filed Weights   07/16/21 0723 07/17/21 0907 07/17/21 1244  Weight: 100.7 kg 82.1 kg 80 kg   Weight change: -2.201 kg  Intake/Output from previous day: 09/21 0701 - 09/22 0700 In: 682.5 [P.O.:120; NG/GT:562.5] Out: 0  Intake/Output this shift: Total I/O In: 0  Out: 2765 [Other:2765] Filed Weights   07/16/21 0723 07/17/21 0907 07/17/21 1244  Weight: 100.7 kg 82.1 kg 80 kg   Examination: General exam: Mildly somnolent, elderly HEENT:Oral mucosa moist, Ear/Nose WNL grossly, dentition normal. Respiratory system: bilaterally clear breath sounds, no use of accessory muscle Cardiovascular system: S1 & S2 +, No JVD,. Gastrointestinal system:  Abdomen soft, NT,ND, BS+ Nervous System:Alert, awake, spastic weakness left side Extremities: Upper extremity are edematous, distal peripheral pulses palpable.  Skin: No rashes,no icterus. MSK: Normal muscle bulk,tone, power  HD catheter on chest, core track tube feed present  Data Reviewed: I have personally reviewed following labs and imaging studies CBC: Recent Labs  Lab 07/14/21 0709 07/15/21 0812 07/17/21 0930  WBC 11.8* 11.0* 10.2  HGB 8.2* 8.3* 9.1*  HCT 28.1* 27.9* 29.1*  MCV 100.7* 100.0 98.0  PLT 272 310 [id number]    Basic Metabolic Panel: Recent Labs  Lab 07/11/21 1123 07/15/21 0812 07/17/21 0930  NA 131* 130* 128*  K 4.4 6.5* 6.3*  CL 94* 93* 90*  CO2 '30 29 30  '$ GLUCOSE 129* 137* 147*  BUN 18 39* 39*  CREATININE 2.40* 3.51* 3.18*  CALCIUM 8.3* 8.4* 8.3*  PHOS 1.9* 2.4* 2.7    GFR: Estimated Creatinine Clearance: 17.2 mL/min (A) (by C-G formula based on SCr of 3.18 mg/dL (H)). Liver Function Tests: Recent Labs  Lab 07/11/21 1123 07/15/21 0812 07/17/21 0930  ALBUMIN 2.0* 1.8* 1.9*    No results for input(s): LIPASE, AMYLASE in the last 168 hours. No results for input(s): AMMONIA in the last 168 hours. Coagulation Profile: No results for input(s): INR, PROTIME in the last 168 hours. Cardiac Enzymes: No results for input(s): CKTOTAL, CKMB, CKMBINDEX, TROPONINI in the last 168 hours. BNP (last 3 results) No results for input(s): PROBNP in the last 8760 hours. HbA1C: No results for input(s): HGBA1C in the last 72 hours. CBG: Recent Labs  Lab 07/16/21 1152 07/16/21 1626 07/16/21 2052 07/17/21 0633 07/17/21 1341  GLUCAP 162* 147* 140* 140* 141*    Lipid Profile: No results for input(s): CHOL, HDL, LDLCALC, TRIG, CHOLHDL, LDLDIRECT in the last 72 hours. Thyroid Function Tests: No results for input(s): TSH, T4TOTAL, FREET4, T3FREE, THYROIDAB in the last 72 hours. Anemia Panel: No results for input(s): VITAMINB12,  FOLATE, FERRITIN, TIBC, IRON,  RETICCTPCT in the last 72 hours. Sepsis Labs: No results for input(s): PROCALCITON, LATICACIDVEN in the last 168 hours.  No results found for this or any previous visit (from the past 240 hour(s)).     Radiology Studies: No results found.   LOS: 23 days   Antonieta Pert, MD [facility]  07/17/2021, 4:49 PM

## 2021-07-17 NOTE — Progress Notes (Signed)
[facility] PROGRESS NOTE  Amy [name] [id number] DOB: 01/24/1946 DOA: 05/19/2021 PCP: [name], MD  Status:  Remains inpatient appropriate because:Unsafe d/c plan, IV treatments appropriate due to intensity of illness or inability to take PO, and Inpatient level of care appropriate due to severity of illness  Dispo: The patient is from: Home              Anticipated d/c is to: SNF              Patient currently is medically stable to d/c.   Difficult to place patient Yes              Barriers to discharge: Dialysis patient with markedly decreased mobility-we will need to prove tolerance of sitting in chair for hemodialysis while here as well as tolerating sitting in wheelchair for up to 1 hour x 2 to replicate transport process.  Cannot discharge and proceed with outpatient hemodialysis unless this is accomplished.    Level of care: Med-Surg  Code Status: DNR Family communication: Spoke with son [name] on 9/21.  He was not able to convince his mother to except a gastrostomy tube.  I spoke with him again today and given concerns over possible changes in mentation I asked psychiatry to reevaluate patient's capacity.  [name] was in agreement.  He still would prefer that his mother have a gastrostomy tube.  Although this can be accomplished legally without patient's permission it is suspected that the patient would actually try to pull out this tube therefore warranting bilateral mittens which are considered a restraint device which would preclude discharge to nursing facility.  I also explained that her surgical procedure is very high risk and given the fact that his mom does not want the tube and could potentially pull the tube out surgery it might be reluctant to proceed with surgical procedure.  I also discussed with him that if she is found to have capacity and he cannot talk her into a tube the best course of action would be to discharge her to a facility and see if  she will start to eat.  If she is not eating my recommendation was to focus on comfort and eventually stop hemodialysis and allow a natural death.  At this time the son wants to talk to his mother again to see if he can talk her into a tube and we plan to talk again later in the week. DVT prophylaxis: Eliquis currently on hold secondary to need for placement of Eva vaccination status: [name] 02/26/2020, Pfizer 04/10/2021    HPI: 75 y.o. female with PMH significant for ESRD on HD MWF, HTN, HLD, CVA s/p TPA 02/2021 with resultant left hemiparesis, partial blood clot upper extremity, recently hospitalized to [facility] on 03/13/2021-04/22/2021 during which patient underwent flexible sigmoidoscopy on 03/26/2021 which showed multiple rectal ulcers likely secondary to trauma from enemas and constipation.  Patient presented to the ED on 7/25 with dark stool.  On presentation, hemoglobin was 9.6; GI was consulted.  She underwent EGD which was normal and flexible sigmoidoscopy which showed nonbleeding distal rectal ulcers/stercoral ulcers.  Subsequently, GI signed off.  Neurology was also consulted for altered mental status; MRI brain was similar to prior MRI at [facility] with a large right MCA stroke.   Nephrology has been following for dialysis needs. General surgery was consulted for G-tube placement but patient does not want a G-tube and recently was deemed to have capacity by psych team.  Son wants  patient to have tube since she has a history of not eating well.  Patient having waxing and waning mentation so we have asked psychiatric to see patient again as of 9/21.  See above documentation and my conversation with the son on 9/21 regarding risks of placing gastrostomy tube against patient's wishes as well as risk for open gastrostomy tube placement.  My recommendation was to send patient back to facility and if she stops eating or refuses to eat make her comfort care and stop dialysis.  Subjective: Patient remains  oriented and appears to have tolerated first dose of Zyprexa last night.  She continues to refuse placement of G tube even when she was informed tube could be removed at a later date.  Objective: Vitals:   07/16/21 2054 07/17/21 0502  BP: (!) 171/61 (!) 158/92  Pulse: 93 91  Resp: 16 16  Temp: 98 F (36.7 C) 98.4 F (36.9 C)  SpO2: 100% 99%    Intake/Output Summary (Last 24 hours) at 07/17/2021 0813 Last data filed at 07/16/2021 2208 Gross per 24 hour  Intake 682.5 ml  Output 0 ml  Net 682.5 ml   Filed Weights   07/15/21 0753 07/15/21 1556 07/16/21 0723  Weight: 102.9 kg 66.7 kg 100.7 kg    Exam:  Constitutional: NAD, calm, alert Respiratory: CTA, no increased work of breathing, room air Cardiovascular: S1-S2, no JVD, no peripheral edema, regular pulse and normotensive Abdomen: Soft and nondistended with normoactive bowel sounds.[name] LBM 9/22 --cortrack for feeding-no oral intake and is refusing all hospital food Neurologic: CN 2-12 grossly intact -Strength 3/5 on the right. 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    Assessment/Plan: Acute problems: Acute on chronic lower GI bleeding from stercoral ulcers -Patient presented with anemia,  underwent EGD which was normal and flex sigmoidoscopy with clean base, nonbleeding distal rectal ulcers (stercoral ulcers). Gastroenterology now signed off  -Hemoglobin stable (7-8).     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 Dysphagia-ruled out MRI brain done in this admission similar in appearance to her prior MRI at [facility] with no drastic changes EEG with no seizure activity or epileptiform discharges. Continue Lipitor (lipid pnl/HgbA1c WNL) Diet changed to regular and patient is allowed outside food. Patient evaluated psychiatric team this admission and determined to have capacity.  Given  issues related to waxing and waning mentation and delirium symptoms especially at night have asked psych to reevaluate capacity as of 9/21  Acute delirium Psychiatry has reevaluated the patient and tentatively suspect she has capacity but agrees that she may not fully understand the implications of not pursuing a feeding tube and not eating at the same time. They have added olanzapine at bedtime to help with appetite and nocturnal delirium symptoms Per psychiatric team notes: " Patient is able to clearly state her choices, recall prior conversations about her choices, appreciate the consequences of her choices and briefly provided rationale for her choices; her statements today are consistent with her prior expressed wishes."   She consistently states when asked that she does not want "that tube in my stomach" Plan is to talk with patient's son tomorrow and discussed that she has been found once again to have capacity that currently is not being influenced by her mild delirium symptoms.  It is my medical opinion that we should not pursue placing a feeding tube against her wishes; doing so  would cause harm to the patient especially given that she is high risk for open G-tube placement.  Placing a G-tube against patient's wishes would require placement of bilateral restraints in order to keep patient from pulling out the G-tube she does not want.  With restraints in place she is not a candidate to be discharged from the hospital.  This has been previously discussed with the patient's son on at least 2 occasions per myself.  If patient's son continues to insist on placing a tube I will need to ask surgery to speak with him directly given that I think they would not want to place a tube under the circumstances.  ESRD on HD Patient has tolerated dialysis up in a chair which is a requirement to resume outpatient hemodialysis I have discussed with the patient's son eventual stopping hemodialysis if feeding tube  not placed and if patient continues to eat poorly.  Emphasized focus on comfort and what his mother wants.  Hyperkalemia/hypophosphatemia Management per nephrology HD -supplementation as needed   Hypotension Blood pressure currently is stable on midodrine.   Failure to thrive /Generalized deconditioning  /very poor oral intake PEG tube was desired by family but could not be placed because of overlying transverse colon.   Continues to not eat hospital food despite promises that she will eat.  Family has been unable to consistently provide home-cooked meals which patient states she prefers Documented above may need to switch focus to comfort measures honor patient's wishes to not place feeding tube  Constipation Patient was on chronic narcotics prior to admission and these remain in place Continue Chronulac daily  Type 2 diabetes mellitus Episodes of hypoglycemia Hemoglobin A1c 5.1.  Diet controlled at baseline  Chronic thrombus left upper extremity 9/9 will resume Eliquis post TDC placed 9/7 and is functional Elevate as appropriate  Stage II mid coccyx pressure injury, present on admission Continue wound care    GERD Could also be contributing to patient's poor oral intake Increased frequency of Protonix to BID sitter adding Pepcid     Data Reviewed: Basic Metabolic Panel: Recent Labs  Lab 07/10/21 1532 07/11/21 1123 07/15/21 0812  NA 125* 131* 130*  K 5.0 4.4 6.5*  CL 89* 94* 93*  CO2 '26 30 29  '$ GLUCOSE 165* 129* 137*  BUN 28* 18 39*  CREATININE 3.32* 2.40* 3.51*  CALCIUM 7.9* 8.3* 8.4*  PHOS 2.2* 1.9* 2.4*   Liver Function Tests: Recent Labs  Lab 07/10/21 1532 07/11/21 1123 07/15/21 0812  ALBUMIN 1.9* 2.0* 1.8*   No results for input(s): LIPASE, AMYLASE in the last 168 hours. No results for input(s): AMMONIA in the last 168 hours. CBC: Recent Labs  Lab 07/10/21 1532 07/14/21 0709 07/15/21 0812  WBC 12.5* 11.8* 11.0*  HGB 8.3* 8.2* 8.3*  HCT 26.8*  28.1* 27.9*  MCV 95.7 100.7* 100.0  PLT 234 272 310    CBG: Recent Labs  Lab 07/16/21 0639 07/16/21 1152 07/16/21 1626 07/16/21 2052 07/17/21 0633  GLUCAP 167* 162* 147* 140* 140*     Scheduled Meds:  apixaban  2.5 mg Per Tube BID   atorvastatin  40 mg Per Tube Daily   chlorhexidine  15 mL Mouth Rinse BID   Chlorhexidine Gluconate Cloth  6 each Topical Daily   darbepoetin (ARANESP) injection - DIALYSIS  200 mcg Intravenous Q Thu-HD   feeding supplement  237 mL Oral BID BM   feeding supplement (PROSource TF)  45 mL Per Tube Daily   gabapentin  100 mg Per Tube Q12H   insulin aspart  0-6 Units Subcutaneous TID WC   lactulose  20 g Per Tube Daily   mouth rinse  15 mL Mouth Rinse q12n4p   midodrine  10 mg Per Tube TID WC   mirtazapine  7.5 mg Per Tube QHS   multivitamin  1 tablet Per Tube QHS   OLANZapine  2.5 mg Per Tube QHS   pantoprazole sodium  40 mg Per Tube Daily   sodium chloride flush  10-40 mL Intracatheter Q12H   thiamine  100 mg Per Tube Daily   venlafaxine  37.5 mg Per Tube BID   Continuous Infusions:  feeding supplement (OSMOLITE 1.5 CAL) 50 mL/hr at 07/17/21 0450   iron sucrose 100 mg (07/15/21 1043)    Principal Problem:   GI bleed Active Problems:   Essential hypertension   Type 2 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, with long-term current use of insulin (HCC)   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   ESRD on dialysis ([place])   Dyslipidemia   Hypokalemia   Stage 2 skin ulcer of sacral region ([place])   Cerebral thrombosis with cerebral infarction   Protein-calorie malnutrition, severe   Acute GI bleeding   Oropharyngeal dysphagia   Failure to thrive in adult   Inadequate oral intake   Clogged feeding tube   Fever   Rectal bleeding   Delirium due to another medical condition   Consultants: Vascular surgery Nephrology Gastroenterology Neurology Palliative medicine General  surgery  Procedures: EEG Echocardiogram Cortrack EGD  Antibiotics: Ceftriaxone 8/3 through 8/5   Time spent: 25 minutes    Erin Hearing ANP  [facility] 7 am - 330 pm/M-F for direct patient care and secure chat Please refer to Amion for contact info 58  days

## 2021-07-17 NOTE — Progress Notes (Signed)
[facility] Progress Note   Subjective:   Patient seen and examined at bedside in dialysis.  More somnolent today.   Objective Vitals:   07/17/21 0930 07/17/21 1000 07/17/21 1030 07/17/21 1100  BP: (!) 147/66 125/70 (!) 154/68 (!) 118/54  Pulse: (!) 106 (!) 107 (!) 114 (!) 117  Resp:      Temp:      TempSrc:      SpO2:      Weight:      Height:       Physical Exam General:chronically ill appearing female in NAD Heart:RRR, no mrg Lungs:mostly CTAB anterolaterally  Abdomen:soft, NTND Extremities:1+ edema in hips/thighs and UE Dialysis Access: [facility] in use   Filed Weights   07/15/21 1556 07/16/21 0723 07/17/21 0907  Weight: 66.7 kg 100.7 kg 82.1 kg    Intake/Output Summary (Last 24 hours) at 07/17/2021 1128 Last data filed at 07/17/2021 0900 Gross per 24 hour  Intake 562.5 ml  Output 0 ml  Net 562.5 ml    Additional Objective Labs: Basic Metabolic Panel: Recent Labs  Lab 07/10/21 1532 07/11/21 1123 07/15/21 0812  NA 125* 131* 130*  K 5.0 4.4 6.5*  CL 89* 94* 93*  CO2 '26 30 29  '$ GLUCOSE 165* 129* 137*  BUN 28* 18 39*  CREATININE 3.32* 2.40* 3.51*  CALCIUM 7.9* 8.3* 8.4*  PHOS 2.2* 1.9* 2.4*   Liver Function Tests: Recent Labs  Lab 07/10/21 1532 07/11/21 1123 07/15/21 0812  ALBUMIN 1.9* 2.0* 1.8*    CBC: Recent Labs  Lab 07/10/21 1532 07/14/21 0709 07/15/21 0812  WBC 12.5* 11.8* 11.0*  HGB 8.3* 8.2* 8.3*  HCT 26.8* 28.1* 27.9*  MCV 95.7 100.7* 100.0  PLT 234 272 310   CBG: Recent Labs  Lab 07/16/21 0639 07/16/21 1152 07/16/21 1626 07/16/21 2052 07/17/21 0633  GLUCAP 167* 162* 147* 140* 140*    Medications:  sodium chloride     sodium chloride     feeding supplement (OSMOLITE 1.5 CAL) 1,000 mL (07/17/21 0814)   iron sucrose 100 mg (07/15/21 1043)    apixaban  2.5 mg Per Tube BID   atorvastatin  40 mg Per Tube Daily   chlorhexidine  15 mL Mouth Rinse BID   Chlorhexidine Gluconate Cloth  6 each Topical Daily    darbepoetin (ARANESP) injection - DIALYSIS  200 mcg Intravenous Q Thu-HD   feeding supplement  237 mL Oral BID BM   feeding supplement (PROSource TF)  45 mL Per Tube Daily   gabapentin  100 mg Per Tube Q12H   insulin aspart  0-6 Units Subcutaneous TID WC   lactulose  20 g Per Tube Daily   mouth rinse  15 mL Mouth Rinse q12n4p   midodrine  10 mg Per Tube TID WC   mirtazapine  7.5 mg Per Tube QHS   multivitamin  1 tablet Per Tube QHS   OLANZapine  2.5 mg Per Tube QHS   pantoprazole sodium  40 mg Per Tube Daily   sodium chloride flush  10-40 mL Intracatheter Q12H   thiamine  100 mg Per Tube Daily   venlafaxine  37.5 mg Per Tube BID    Dialysis Orders: AF MWF  3h 80mn 400/500    62.5kg    3K/2.5 Ca    P2  AVG HeRO   Hep none - Mircera 50 q 2 wks  (7/13)  Venofer 50 q wk - Parsabiv '5mg'$  IV q HD   Assessment/Plan: AMS/FTT: Mental status is improving  and she is tolerating being in chair during HD. HD done in the bed d/t restraints on 9/13. Pt will have to be able to sit in a chair for 4-6 hrs without restraints to qualify for OP HD w/ our group.  If she continues to tolerate sitting in the chair for dialysis and cooperate, then will re-CLIP the patient as her OP HD unit will not take her back. HD Nurse Coordinator and Renal Navigator following, stated prior to re-clipping her, we need to ensure patient nutritional status is stable.  Melena/Rectal bleeding - GI consulted. Hx rectal ulcers.  EGD 7/27 normal, flex sig with mucosal ulceration c/w sterocoral ulcers, non bleeding. Hgb 8s, stable. ESRD: HD TTS while here. HD today.   Dysphagia -  Patient refusing G-tube placement.  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 AVG clotted 9/2, unable to be declotted d/t arm contracture. S/P conversion to tunneled HD catheter 9/7 via IR-on Heparin drip, appreciate their assistance.  Hypertension/volume: BP stable, on midodrine '10mg'$  TID with  significant overload on exam.  Continue to maximize UF as tolerated, can give albumin with HD for BP support.   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  Getting TFs via Core Track-RD managing tube. Refuses G-tube. 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 9. Psych eval 9/14:  It is determined that patient has functional mental capacity for medical decision-making.  2nd evaluation by psych agreed with assessment.  10. Anemia: Continue Aranesp-dose increase to 200 mcg q week on 9/15.  Last Hgb 8.3.   11. Dispo: DNR/DNI. Poor prognosis. Palliative care has seen see #1. Appreciate their involvement. Although patient is currently tolerating being in the chair for HD, we do not believe dialysis is adding to her quality of life. Per family communication, family will consider stopping HD if patient continues to refuse eating and G tube placement.  Patient is refusing G-tube placement which places another barrier because patient cannot be discharged with the [facility] (discussed this with HD coordinator). Ongoing discussions with multi-discipline team-continue CoreTrack with tube feedings.   Jen Mow, PA-C [facility] 07/17/2021,11:28 AM  LOS: 58 days

## 2021-07-18 DIAGNOSIS — I6330 Cerebral infarction due to thrombosis of unspecified cerebral artery: Secondary | ICD-10-CM | POA: Diagnosis not present

## 2021-07-18 DIAGNOSIS — R627 Adult failure to thrive: Secondary | ICD-10-CM | POA: Diagnosis not present

## 2021-07-18 DIAGNOSIS — E43 Unspecified severe protein-calorie malnutrition: Secondary | ICD-10-CM | POA: Diagnosis not present

## 2021-07-18 DIAGNOSIS — F05 Delirium due to known physiological condition: Secondary | ICD-10-CM | POA: Diagnosis not present

## 2021-07-18 LAB — RESP PANEL BY RT-PCR (FLU A&B, COVID) ARPGX2
Influenza A by PCR: NEGATIVE
Influenza B by PCR: NEGATIVE
SARS Coronavirus 2 by RT PCR: NEGATIVE

## 2021-07-18 LAB — GLUCOSE, CAPILLARY
Glucose-Capillary: 221 mg/dL — ABNORMAL HIGH (ref 70–99)
Glucose-Capillary: 150 mg/dL — ABNORMAL HIGH (ref 70–99)
Glucose-Capillary: 76 mg/dL (ref 70–99)
Glucose-Capillary: 75 mg/dL (ref 70–99)

## 2021-07-18 MED ORDER — HEPARIN (PORCINE) 25000 UT/250ML-% IV SOLN
2100.0000 [IU]/h | INTRAVENOUS | Status: DC
  Administered 2021-07-18: 2100 [IU]/h via INTRAVENOUS
  Filled 2021-07-18 (×2): qty 250

## 2021-07-18 MED ORDER — OSMOLITE 1.5 CAL PO LIQD
1000.0000 mL | ORAL | Status: DC
  Filled 2021-07-18 (×5): qty 1000

## 2021-07-18 MED ORDER — PROSOURCE TF PO LIQD
45.0000 mL | Freq: Every day | ORAL | Status: DC
  Filled 2021-07-18 (×4): qty 45

## 2021-07-18 MED ORDER — OLANZAPINE 5 MG PO TABS: 2.5000 mg | ORAL_TABLET | Freq: Every day | ORAL | Status: DC

## 2021-07-18 NOTE — Progress Notes (Signed)
ANTICOAGULATION CONSULT NOTE Pharmacy Consult for Heparin Indication:  h/o CVA and chronic UE thrombus  Allergies  Allergen Reactions   Sensipar [Cinacalcet]     Per MAR   Statins     Per [facility]    Patient Measurements: Height: '5\' 7"'$  (170.2 cm) Weight: 97.5 kg (215 lb) IBW/kg (Calculated) : 61.6 Heparin Dosing Weight:  85 kg  Vital Signs: Temp: 100.2 F (37.9 C) (09/23 1000) Temp Source: Oral (09/23 1000) BP: 122/57 (09/23 1000) Pulse Rate: 110 (09/23 1000)  Labs: Recent Labs    07/17/21 0930  HGB 9.1*  HCT 29.1*  PLT 306  CREATININE 3.18*     Estimated Creatinine Clearance: 18.9 mL/min (A) (by C-G formula based on SCr of 3.18 mg/dL (H)).  Assessment: 75 y.o. female with hx CVA and chronic UE thrombus.  Patient with prolonged hospitalization and difficult disposition.  Family has elected to pursue PEG tube placement for ongoing nutritional needs.  Surgery consulted.  Will stop Eliquis and bridge with heparin pending surgical plans.    Last dose Eliquis 9/23 at 0900. Will start heparin this evening.  Heparin levels will initially be elevated in the setting of recent apixaban, so we will utilize aPTT for heparin monitoring.    Goal of Therapy:  aPTT 66-102 Heparin level 0.3-0.7 units/ml Monitor platelets by anticoagulation protocol: Yes   Plan:  Start heparin at 1000 units/hr - at 2100 9/23 Check aPTT, heparin level, and CBC in 8 hours Daily heparin levels, aPTT, and CBC while on heparin   Thank you for allowing pharmacy to be a part of this patient's care.  [employer], Pharm.D., BCPS Clinical Pharmacist  **Pharmacist phone directory can be found on [email or web address] listed under [place].  07/18/2021 12:58 PM

## 2021-07-18 NOTE — Progress Notes (Signed)
[facility] Progress Note   Subjective:    Patient seen and examined at bedside. Appears more confused today. Tolerated yesterday's HD with net UF 2.7L. Plan for HD 9/26.  Objective Vitals:   07/17/21 1641 07/17/21 2100 07/18/21 0524 07/18/21 1000  BP: 140/79 (!) 152/58 (!) 132/52 (!) 122/57  Pulse: (!) 106 (!) 107 (!) 105 (!) 110  Resp: '16 18 18 18  '$ Temp: 98.6 F (37 C) 98.4 F (36.9 C) 97.6 F (36.4 C) 100.2 F (37.9 C)  TempSrc:   Oral Oral  SpO2: 99% 96% 97% 100%  Weight:  97.5 kg    Height:       Physical Exam General:chronically ill appearing female in NAD Heart:RRR, no mrg Lungs:mostly CTAB anterolaterally  Abdomen:soft, NTND Extremities:1+ edema in hips/thighs and UE Dialysis Access: [facility] in use   Filed Weights   07/17/21 0907 07/17/21 1244 07/17/21 2100  Weight: 82.1 kg 80 kg 97.5 kg    Intake/Output Summary (Last 24 hours) at 07/18/2021 1348 Last data filed at 07/18/2021 0800 Gross per 24 hour  Intake 0 ml  Output --  Net 0 ml    Additional Objective Labs: Basic Metabolic Panel: Recent Labs  Lab 07/15/21 0812 07/17/21 0930  NA 130* 128*  K 6.5* 6.3*  CL 93* 90*  CO2 29 30  GLUCOSE 137* 147*  BUN 39* 39*  CREATININE 3.51* 3.18*  CALCIUM 8.4* 8.3*  PHOS 2.4* 2.7   Liver Function Tests: Recent Labs  Lab 07/15/21 0812 07/17/21 0930  ALBUMIN 1.8* 1.9*   No results for input(s): LIPASE, AMYLASE in the last 168 hours. CBC: Recent Labs  Lab 07/14/21 0709 07/15/21 0812 07/17/21 0930  WBC 11.8* 11.0* 10.2  HGB 8.2* 8.3* 9.1*  HCT 28.1* 27.9* 29.1*  MCV 100.7* 100.0 98.0  PLT 272 310 306   Blood Culture    Component Value Date/Time   SDES BLOOD RIGHT ANTECUBITAL 06/26/2021 1234   SPECREQUEST  06/26/2021 1234    BOTTLES DRAWN AEROBIC AND ANAEROBIC Blood Culture adequate volume   CULT  06/26/2021 1234    NO GROWTH 5 DAYS Performed at [facility], [place] [address], [place], [place] [address]    REPTSTATUS  07/01/2021 FINAL 06/26/2021 1234    Cardiac Enzymes: No results for input(s): CKTOTAL, CKMB, CKMBINDEX, TROPONINI in the last 168 hours. CBG: Recent Labs  Lab 07/17/21 1341 07/17/21 1642 07/17/21 2103 07/18/21 0642 07/18/21 1154  GLUCAP 141* 202* 166* 221* 150*   Iron Studies: No results for input(s): IRON, TIBC, TRANSFERRIN, FERRITIN in the last 72 hours. Lab Results  Component Value Date   INR 0.9 03/10/2021   INR 1.0 01/26/2021   Studies/Results: No results found.  Medications:  feeding supplement (OSMOLITE 1.5 CAL)     heparin     iron sucrose Stopped (07/17/21 1500)    atorvastatin  40 mg Per Tube Daily   chlorhexidine  15 mL Mouth Rinse BID   Chlorhexidine Gluconate Cloth  6 each Topical Daily   darbepoetin (ARANESP) injection - DIALYSIS  200 mcg Intravenous Q Thu-HD   feeding supplement  237 mL Oral BID BM   feeding supplement (PROSource TF)  45 mL Per Tube Daily   gabapentin  100 mg Per Tube Q12H   insulin aspart  0-6 Units Subcutaneous TID WC   lactulose  20 g Per Tube Daily   mouth rinse  15 mL Mouth Rinse q12n4p   midodrine  10 mg Per Tube TID WC  mirtazapine  7.5 mg Per Tube QHS   multivitamin  1 tablet Per Tube QHS   OLANZapine  2.5 mg Per Tube QHS   pantoprazole sodium  40 mg Per Tube Daily   sodium chloride flush  10-40 mL Intracatheter Q12H   thiamine  100 mg Per Tube Daily   venlafaxine  37.5 mg Per Tube BID    Dialysis Orders: AF MWF  3h 29mn 400/500    62.5kg    3K/2.5 Ca    P2  AVG HeRO   Hep none - Mircera 50 q 2 wks  (7/13)  Venofer 50 q wk - Parsabiv '5mg'$  IV q HD  Assessment/Plan: AMS/FTT: Patient appears more confused today and restraints have been utilized on/off. Pt will have to be able to sit in a chair for 4-6 hrs without restraints to qualify for OP HD w/ our group.  If she continues to tolerate sitting in the chair for dialysis and cooperate, then will re-CLIP the patient as her OP HD unit will not take her back. HD Nurse  Coordinator and Renal Navigator following, stated prior to re-clipping her, we need to ensure patient nutritional status is stable. Core Track tube feeding has been pulled.  Melena/Rectal bleeding - GI consulted. Hx rectal ulcers.  EGD 7/27 normal, flex sig with mucosal ulceration c/w sterocoral ulcers, non bleeding. Hgb 8s, stable. ESRD: HD TTS while here. Noted K+ from 9/22 of 6.3-Tolerated yesterday's HD with net UF 2.7L. Will check RFP in AM. Due to current HD census, plan for HD on 9/26-will continue to monitor her volume status closely. Dysphagia -  Patient refusing G-tube placement.  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 AVG clotted 9/2, unable to be declotted d/t arm contracture. S/P conversion to tunneled HD catheter 9/7 via IR-on Heparin drip, appreciate their assistance.  Hypertension/volume: BP stable, on midodrine '10mg'$  TID with significant overload on exam.  Continue to maximize UF as tolerated, can give albumin with HD for BP support.   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  Getting TFs via Core Track-RD managing tube. Refuses G-tube. 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 9. Psych eval from 9/14 determined that patient has functional mental capacity for medical decision-making.  2nd evaluation by psych agreed with assessment. Today, hospitalist team and myself were at patient's bedside. She appears more confused-it was determined that she is unable to make medical decisions at this time. 10. Anemia: Continue Aranesp-dose increase to 200 mcg q week on 9/15.  Last Hgb 9.1.   11. Dispo: DNR/DNI. Poor prognosis. Palliative care has seen see #1. Appreciate their involvement. Although patient is currently tolerating being in the chair for HD, we do not believe dialysis is adding to her quality of life. Per family communication, family will consider stopping HD if patient continues to refuse eating and G tube placement.  Patient  is refusing G-tube placement which places another barrier [facility]discussed [facility] with [facility] coordinator). Ongoing discussions with multi-discipline team. Today, hospitalist team contacted son d/t patient being more confused. Plan for primary team to re-consult Surgery for Gtube placement per Son's request.  CTobie Poet NP [place]9/23/2022,1:48 PM  LOS: 59 days

## 2021-07-18 NOTE — Consult Note (Addendum)
Reason for Consult: Reevaluate capacity. Patient has been intermittently hallucinating, still not eating Referring Physician: Samella Parr, NP  Amy [name] is an 75 y.o. female.  HPI:  75 y.o. 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  Patient presented to the ED on 7/25 with dark stool.  On presentation, hemoglobin was 9.6; GI was consulted.  She underwent EGD which was normal and flexible sigmoidoscopy which showed nonbleeding distal rectal ulcers/stercoral ulcers.  Subsequently, GI signed off.  Neurology was also consulted for altered mental status; MRI brain was similar to prior MRI at [facility] with a large right MCA stroke.   Today patient continues to lay in bed staring off into the corner of the room.  She reports that she is doing fine.  She reports that her sleep is fine.  She reports that she will has not been eating the food because she does not like it.  She reports no SI, HI, or AVH.  When attempting to determine her baseline orientation she was oriented to person only thinking that we were currently in [place] and a rehab facility and that is October 2020.  When she was reoriented she did not continue to insist that she was not in [facility] as she did when previously seen.  Discussed with her that we were there to evaluate her capacity for medical making decisions.  She stated that she did not want a feeding tube.  When asked why he stated because she did not want to become fat and not fit into any of her clothing.  Discussed with her that she would not be overfed only fed when she was not eating.  At this point primary team entered the room with a bag of  pork grinds and patient nibbled at 1 before saying she does not like the taste of it.  At this point nephrology also entered the room and during the conversation.  Primary team discussed with her that the consideration on hand was to discharge her back to her nursing home and hope that her eating picked up, however, she would not be admitted back into the hospital if she failed to start eating and most likely dialysis would be stopped.  At this point patient stated she did not understand.  The discussion was that she would need to make a choice whether she wanted to have a tube placed so she could keep receiving nutrients and she would continue to have dialysis or the plan would be to send her back to her facility and not continue dialysis to allow her to pass peacefully.  At this point she asked if she got the tube high which he and nephrology confirmed that she has not been making urine and that dialysis would continue to take care of that for her.  Again she was asked which option she would like to choose as she stated we needed to ask her mother.  At first she stated her mother lived in [place].  However, as the conversation continued and she was told that she would have to make this decision for herself she became more and more delusional.  She then stated she could not make this decision and needed her mother and father to  help explain things to her.  When told that given patient's age her parents were most likely deceased she then stated that no they were not they were simply a few floors up in the hospital but still here.  At this point she also became slightly unintelligible as she became more confused.  Discussed with her that we would talk with her son about what the next steps might be.  She had no other concerns at present.   Past Medical History:  Diagnosis Date   Chronic kidney disease    dialysis M-W-F - dialysis cath located in right side of chest   Diabetes mellitus without complication ([place])     type 2 - no meds    HLD (hyperlipidemia)    diet controlled   Hypertension    no meds    Past Surgical History:  Procedure Laterality Date   A/V FISTULAGRAM Left 01/10/2021   Procedure: A/V FISTULAGRAM;  Surgeon: Angelia Mould, MD;  Location: [place] [facility];  Service: Cardiovascular;  Laterality: Left;   A/V FISTULAGRAM N/A 01/27/2021   Procedure: A/V FISTULAGRAM;  Surgeon: Waynetta Sandy, MD;  Location: [place] [facility];  Service: Cardiovascular;  Laterality: N/A;   AV FISTULA PLACEMENT Left 09/24/2020   Procedure: LEFT UPPER ARM ARTERIOVENOUS GORTEX  GRAFT;  Surgeon: Elam Dutch, MD;  Location: [place] [facility];  Service: Vascular;  Laterality: Left;   BREAST CYST EXCISION Right 2011   cyst removed -neg   ESOPHAGOGASTRODUODENOSCOPY (EGD) WITH PROPOFOL N/A 05/21/2021   Procedure: ESOPHAGOGASTRODUODENOSCOPY (EGD) WITH PROPOFOL;  Surgeon: Jackquline Denmark, MD;  Location[facility]COPY;  Service: Endoscopy;  Laterality: N/A;   EYE SURGERY Bilateral    cataracts   FLEXIBLE SIGMOIDOSCOPY N/A 05/21/2021   Procedure: FLEXIBLE SIGMOIDOSCOPY;  Surgeon: Jackquline Denmark, MD;  Location: [facility] ENDOSCOPY;  Service: Endoscopy;  Laterality: N/A;   INSERTION OF DIALYSIS CATHETER Right 07/2019   IR FLUORO GUIDE CV LINE LEFT  06/27/2021   IR FLUORO GUIDE CV LINE LEFT  07/02/2021   IR GASTROSTOMY TUBE MOD SED  05/29/2021   IR US GUIDE VASC ACCESS LEFT  06/27/2021   PERIPHERAL VASCULAR BALLOON ANGIOPLASTY  01/10/2021   Procedure: PERIPHERAL VASCULAR BALLOON ANGIOPLASTY;  Surgeon: Angelia Mould, MD;  Location: [place];  Service: Cardiovascular;;  left AV graft   TUBAL LIGATION     Ultrasound-guided cannulation left upper arm AV graft  01/27/2021    Family History  Problem Relation Age of Onset   Breast cancer Neg Hx     Social History:  reports that she has quit smoking. She has never used smokeless tobacco. She reports current alcohol use. She reports that she does not  currently use drugs after having used the following drugs: Marijuana.  Allergies:  Allergies  Allergen Reactions   Sensipar [Cinacalcet]     Per MAR   Statins     Per MAR    Medications: I have reviewed the patient's current medications.  Results for orders placed or performed during the hospital encounter of 05/19/21 (from the past 48 hour(s))  Glucose, capillary     Status: Abnormal   Collection Time: 07/16/21  4:26 PM  Result Value Ref Range   Glucose-Capillary 147 (H) 70 - 99 mg/dL    Comment: Glucose reference range applies only to samples taken after fasting for at least 8 hours.  Glucose, capillary     Status: Abnormal   Collection Time: 07/16/21  8:52 PM  Result Value Ref  Range   Glucose-Capillary 140 (H) 70 - 99 mg/dL    Comment: Glucose reference range applies only to samples taken after fasting for at least 8 hours.  Glucose, capillary     Status: Abnormal   Collection Time: 07/17/21  6:33 AM  Result Value Ref Range   Glucose-Capillary 140 (H) 70 - 99 mg/dL    Comment: Glucose reference range applies only to samples taken after fasting for at least 8 hours.  Renal function panel     Status: Abnormal   Collection Time: 07/17/21  9:30 AM  Result Value Ref Range   Sodium 128 (L) 135 - 145 mmol/L   Potassium 6.3 (HH) 3.5 - 5.1 mmol/L    Comment: CRITICAL RESULT CALLED TO, READ BACK BY AND VERIFIED WITH: K ZHAO RN [id number] PGRAHAM    Chloride 90 (L) 98 - 111 mmol/L   CO2 30 22 - 32 mmol/L   Glucose, Bld 147 (H) 70 - 99 mg/dL    Comment: Glucose reference range applies only to samples taken after fasting for at least 8 hours.   BUN 39 (H) 8 - 23 mg/dL   Creatinine, Ser 3.18 (H) 0.44 - 1.00 mg/dL   Calcium 8.3 (L) 8.9 - 10.3 mg/dL   Phosphorus 2.7 2.5 - 4.6 mg/dL   Albumin 1.9 (L) 3.5 - 5.0 g/dL   GFR, Estimated 15 (L) >60 mL/min    Comment: (NOTE) Calculated using the CKD-EPI Creatinine Equation (2021)    Anion gap 8 5 - 15    Comment: Performed at [facility] [address], [place], [place] [address]  CBC     Status: Abnormal   Collection Time: 07/17/21  9:30 AM  Result Value Ref Range   WBC 10.2 4.0 - 10.5 K/uL   RBC 2.97 (L) 3.87 - 5.11 MIL/uL   Hemoglobin 9.1 (L) 12.0 - 15.0 g/dL   HCT 29.1 (L) 36.0 - 46.0 %   MCV 98.0 80.0 - 100.0 fL   MCH 30.6 26.0 - 34.0 pg   MCHC 31.3 30.0 - 36.0 g/dL   RDW 18.9 (H) 11.5 - 15.5 %   Platelets 306 150 - 400 K/uL   nRBC 0.0 0.0 - 0.2 %    Comment: Performed at [facility], [place] [address] [place], [place] [address]  Glucose, capillary     Status: Abnormal   Collection Time: 07/17/21  1:41 PM  Result Value Ref Range   Glucose-Capillary 141 (H) 70 - 99 mg/dL    Comment: Glucose reference range applies only to samples taken after fasting for at least 8 hours.  Glucose, capillary     Status: Abnormal   Collection Time: 07/17/21  4:42 PM  Result Value Ref Range   Glucose-Capillary 202 (H) 70 - 99 mg/dL    Comment: Glucose reference range applies only to samples taken after fasting for at least 8 hours.  Glucose, capillary     Status: Abnormal   Collection Time: 07/17/21  9:03 PM  Result Value Ref Range   Glucose-Capillary 166 (H) 70 - 99 mg/dL    Comment: Glucose reference range applies only to samples taken after fasting for at least 8 hours.  Glucose, capillary     Status: Abnormal   Collection Time: 07/18/21  6:42 AM  Result Value Ref Range   Glucose-Capillary 221 (H) 70 - 99 mg/dL    Comment: Glucose reference range applies only to samples taken after fasting for at least 8  hours.  Resp Panel by RT-PCR (Flu A&B, Covid) Nasopharyngeal Swab     Status: None   Collection Time: 07/18/21 10:11 AM   Specimen: Nasopharyngeal Swab; Nasopharyngeal(NP) swabs in vial transport medium  Result Value Ref Range   SARS Coronavirus 2 by RT PCR NEGATIVE NEGATIVE    Comment: (NOTE) SARS-CoV-2 target nucleic acids are NOT DETECTED.  The SARS-CoV-2 RNA is generally detectable in upper  respiratory specimens during the acute phase of infection. The lowest concentration of SARS-CoV-2 viral copies this assay can detect is 138 copies/mL. A negative result does not preclude SARS-Cov-2 infection and should not be used as the sole basis for treatment or other patient management decisions. A negative result may occur with  improper specimen collection/handling, submission of specimen other than nasopharyngeal swab, presence of viral mutation(s) within the areas targeted by this assay, and inadequate number of viral copies(<138 copies/mL). A negative result must be combined with clinical observations, patient history, and epidemiological information. The expected result is Negative.  Fact Sheet for Patients:  EntrepreneurPulse.com.au  Fact Sheet for Healthcare Providers:  IncredibleEmployment.be  This test is no t yet approved or cleared by the [place] FDA and  has been authorized for detection and/or diagnosis of SARS-CoV-2 by FDA under an Emergency Use Authorization (EUA). This EUA will remain  in effect (meaning this test can be used) for the duration of the COVID-19 declaration under Section 564(b)(1) of the Act, 21 U.S.C.section 360bbb-3(b)(1), unless the authorization is terminated  or revoked sooner.       Influenza A by PCR NEGATIVE NEGATIVE   Influenza B by PCR NEGATIVE NEGATIVE    Comment: (NOTE) The Xpert Xpress SARS-CoV-2/FLU/RSV plus assay is intended as an aid in the diagnosis of influenza from Nasopharyngeal swab specimens and should not be used as a sole basis for treatment. Nasal washings and aspirates are unacceptable for Xpert Xpress SARS-CoV-2/FLU/RSV testing.  Fact Sheet for Patients: EntrepreneurPulse.com.au  Fact Sheet for Healthcare Providers: IncredibleEmployment.be  This test is not yet approved or cleared by the [place] FDA and has been authorized for  detection and/or diagnosis of SARS-CoV-2 by FDA under an Emergency Use Authorization (EUA). This EUA will remain in effect (meaning this test can be used) for the duration of the COVID-19 declaration under Section 564(b)(1) of the Act, 21 U.S.C. section 360bbb-3(b)(1), unless the authorization is terminated or revoked.  Performed at [facility], [place] [address] [place], [place] [address]   Glucose, capillary     Status: Abnormal   Collection Time: 07/18/21 11:54 AM  Result Value Ref Range   Glucose-Capillary 150 (H) 70 - 99 mg/dL    Comment: Glucose reference range applies only to samples taken after fasting for at least 8 hours.    No results found.  Review of Systems  Unable to perform ROS: Psychiatric disorder  Blood pressure (!) 122/57, pulse (!) 110, temperature 100.2 F (37.9 C), temperature source Oral, resp. rate 18, height '5\' 7"'$  (1.702 m), weight 97.5 kg, SpO2 100 %. Physical Exam Vitals and nursing note reviewed.  Constitutional:      General: She is not in acute distress.    Appearance: She is obese. She is not ill-appearing or toxic-appearing.  HENT:     Head: Normocephalic and atraumatic.  Pulmonary:     Effort: Pulmonary effort is normal.  Musculoskeletal:     Comments: No movement in her left extremities   Neurological:     Mental Status: She is alert.  Comments: No movement in her left extremities      07/18/21 1526  Presentation  General Appearance Appropriate for Environment;Casual  Eye Contact None  Speech Clear and Coherent;Slow  Speech Volume Normal  Mood and Affect  Mood Euthymic  Affect Congruent  Thought Processes  Thought Process Disorganized  Descriptions of Associations Loose  Orientation Partial (oriented to person only)  Thought Content Scattered;Delusions  Has patient ever had a diagnosis of schizophrenia or schizoaffective disorder in the past? No  Duration of Psychotic Symptoms Less than six months  Hallucinations None   Ideas of Reference None  Suicidal Thoughts No  Homicidal Thoughts No  Sensorium  Memory Immediate Fair  Judgment Impaired  Insight Lacking  Executive Functions  Concentration Poor  Attention Span Poor  Recall [place] of Knowledge Fair  Language Fair  Psychomotor Activity  Psychomotor Activity Decreased  Assets  Assets Resilience;Social Support  Sleep  Sleep Fair     Assessment/Plan:  75 y.o. 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  Patient presented to the ED on 7/25 with dark stool.  On presentation, hemoglobin was 9.6; GI was consulted.  She underwent EGD which was normal and flexible sigmoidoscopy which showed nonbleeding distal rectal ulcers/stercoral ulcers.  Subsequently, GI signed off.  Neurology was also consulted for altered mental status; MRI brain was similar to prior MRI at [facility] with a large right MCA stroke.   Today patient is disoriented/delirious.  At this point she does not have capacity to make medical decisions as she can no longer consistently state her wishes.  Primary team plans to reach out to her son to discuss next steps in care, however, prognosis is poor.  When patient still had capacity plan had been to send her back to her nursing home so that hopefully she would start eating again.  However, in light of her current capacity the primary team will have to discuss the next steps with the son.  At this point we do not recommend any medication changes.  Have discussed these recommendations with primary team.   -Continue Zyprexa 2.5 mg QHS -Reengage PT/OT with patient   Continue rest of care per Primary Team Psychiatry will continue to follow  the patient   Briant Cedar 07/18/2021, 3:25 PM

## 2021-07-18 NOTE — Progress Notes (Signed)
PT NOTE: Name: LILANA HOUT MRN: [id number] DOB: [birth year]   Patient was evaluated twice during this hospitalization on 8/4 and 8/29 and found to be "+2 total A for all mobility" with "minimal command following" and "without skilled therapy needs".  Recommendations were made for "long term SNF placement."  Please refer to evaluations for further information.  Magda Kiel, [place] [id number] Office:[phone] 07/18/2021

## 2021-07-18 NOTE — Progress Notes (Signed)
[facility] PROGRESS NOTE  Amy [name] [id number] DOB: [birth year] DOA: 05/19/2021 PCP: [name], MD  Status:  Remains inpatient appropriate because:Unsafe d/c plan, IV treatments appropriate due to intensity of illness or inability to take PO, and Inpatient level of care appropriate due to severity of illness  Dispo: The patient is from: Home              Anticipated d/c is to: SNF              Patient currently is medically stable to d/c.   Difficult to place patient Yes              Barriers to discharge: Dialysis patient with markedly decreased mobility-we will need to prove tolerance of sitting in chair for hemodialysis while here as well as tolerating sitting in wheelchair for up to 1 hour x 2 to replicate transport process.  Cannot discharge and proceed with outpatient hemodialysis unless this is accomplished.    Level of care: Med-Surg  Code Status: DNR Family communication: Spoke with son [name] x2 on 9/23.  He was informed that after lengthy interview and assessment of her mother by myself, the psychiatric team and the nephro team it was determined that she does not have capacity to make her own decisions and if he still wants a feeding tube we were willing to proceed and contact the surgical team.  He reported that he wanted the tube placed. DVT prophylaxis: Eliquis currently on hold secondary to need for placement of [place] vaccination status: [name] 02/26/2020, Pfizer 04/10/2021    HPI: 75 y.o. 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  Patient presented to the ED on 7/25 with dark stool.  On presentation, hemoglobin was 9.6; GI was  consulted.  She underwent EGD which was normal and flexible sigmoidoscopy which showed nonbleeding distal rectal ulcers/stercoral ulcers.  Subsequently, GI signed off.  Neurology was also consulted for altered mental status; MRI brain was similar to prior MRI at [facility] with a large right MCA stroke.   Nephrology has been following for dialysis needs. General surgery was consulted for G-tube placement but patient does not want a G-tube and recently was deemed to have capacity by psych team.  Son wants patient to have tube since she has a history of not eating well.  Patient having waxing and waning mentation so we have asked psychiatric to see patient again as of 9/21.  See above documentation and my conversation with the son on 9/21 regarding risks of placing gastrostomy tube against patient's wishes as well as risk for open gastrostomy tube placement.  My recommendation was to send patient back to facility and if she stops eating or refuses to eat make her comfort care and stop dialysis.  Subjective: Awake and alert.  Pleasant but confused.  Try to eat pork rind which she requested but stated she wanted vinegar on the pork rind and there was no vinegar available on the unit  Objective: Vitals:   07/17/21 2100 07/18/21 0524  BP: (!) 152/58 (!) 132/52  Pulse: (!) 107 (!) 105  Resp: 18 18  Temp: 98.4 F (36.9 C) 97.6 F (36.4 C)  SpO2: 96% 97%    Intake/Output Summary (Last  24 hours) at 07/18/2021 0818 Last data filed at 07/17/2021 2214 Gross per 24 hour  Intake 0 ml  Output 2765 ml  Net -2765 ml   Filed Weights   07/17/21 0907 07/17/21 1244 07/17/21 2100  Weight: 82.1 kg 80 kg 97.5 kg    Exam:  Constitutional: NAD, calm, alert Respiratory: CTA, no increased work of breathing, room air Cardiovascular: S1-S2, no JVD, no peripheral edema, regular pulse and normotensive Abdomen: Soft and nondistended with normoactive bowel sounds.[name] LBM 9/22 --cortrack for feeding-no oral intake and is  refusing all hospital food Neurologic: CN 2-12 grossly intact -Strength 3/5 on the right. 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  She stated she would die if she did not eat and and continue to state that she did not want a gastric tube place for feedings.  She she told the providers that "do not worry I will start eating."  When we discussed with the patient that not eating would lead to severe dehydration and and another hospitalization and at some point would end in death stated "what everybody has to die at some time".  When we relayed that once she became extremely weak from not eating that dialysis would need to be stopped because she would not be able to sit up in the dialysis chair and would not tolerate dialysis.  She told us "I do not want to die and I will to continue dialysis".  She was unable to make the connection that if she decides not to except a feeding tube the ultimate endpoint would be stopping dialysis.  The psychiatric team thus has made the decision that she DOES NOT HAVE CAPACITY.  Assessment/Plan: Acute problems: Acute on chronic lower GI bleeding from stercoral ulcers -Patient presented with anemia,  underwent EGD which was normal and flex sigmoidoscopy with clean base, nonbleeding distal rectal ulcers (stercoral ulcers). Gastroenterology now signed off  -Hemoglobin stable (7-8).     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 Continue Lipitor (lipid pnl/HgbA1c WNL)  Acute delirium Psychiatry has reevaluated the patient and tentatively suspect  she has capacity but agrees that she may not fully understand the implications of not pursuing a feeding tube and not eating at the same time. They have added olanzapine at bedtime to help with appetite and nocturnal delirium symptoms  ESRD on HD Patient has tolerated dialysis up in a chair which is a requirement to resume outpatient hemodialysis I have discussed with the patient's son eventual stopping hemodialysis if feeding tube not placed and if patient continues to eat poorly.  Emphasized focus on comfort and what his mother wants.  Hyperkalemia/hypophosphatemia Management per nephrology HD -supplementation as needed   Hypotension Blood pressure currently is stable on midodrine.   Failure to thrive /Generalized deconditioning  /very poor oral intake PEG tube was desired by family but could not be placed because of overlying transverse colon.   Surgery eventually consulted but did not proceed with placement of open G-tube since patient refused. Psychiatric team consulted regarding capacity.  The first 2 evaluations the team thought that the patient had capacity.  A more in-depth  interview and assessment was completed on 9/23 where the patient clearly lacks capacity and understanding regarding not eating, not receiving a feeding tube yet continue to request the ability to attend dialysis because she wanted to live. At this point patient has been deemed to not have capacity The surgical team has been reconsulted.  Since the team will change over to another physician over the weekend full evaluation and discussion of surgical procedure would need to be held by the oncoming physician a full consultation and evaluation will occur on Monday. Eliquis placed on hold for anticipated surgical procedure  **Note earlier this morning the plan was to not place a forced G-tube and instead let the patient discharged to nursing home facility without a feeding tube in place.  Tube feeding discontinued and  core track tube removed.  Subsequently patient was later deemed to not have capacity and plan is to proceed with feeding tube.  I have requested that radiology place a small bore feeding tube 10 [place] since core track team will not have the ability to place a feeding tube today  Constipation Patient was on chronic narcotics prior to admission and these remain in place Continue Chronulac daily  Type 2 diabetes mellitus Episodes of hypoglycemia Hemoglobin A1c 5.1.  Diet controlled at baseline  Chronic thrombus left upper extremity 9/23 Eliquis held and pharmacy consulted for IV heparin to bridge anticipation of upcoming surgery  Stage II mid coccyx pressure injury, present on admission Continue wound care    GERD Could also be contributing to patient's poor oral intake Increased frequency of Protonix to BID sitter adding Pepcid     Data Reviewed: Basic Metabolic Panel: Recent Labs  Lab 07/11/21 1123 07/15/21 0812 07/17/21 0930  NA 131* 130* 128*  K 4.4 6.5* 6.3*  CL 94* 93* 90*  CO2 '30 29 30  '$ GLUCOSE 129* 137* 147*  BUN 18 39* 39*  CREATININE 2.40* 3.51* 3.18*  CALCIUM 8.3* 8.4* 8.3*  PHOS 1.9* 2.4* 2.7   Liver Function Tests: Recent Labs  Lab 07/11/21 1123 07/15/21 0812 07/17/21 0930  ALBUMIN 2.0* 1.8* 1.9*   No results for input(s): LIPASE, AMYLASE in the last 168 hours. No results for input(s): AMMONIA in the last 168 hours. CBC: Recent Labs  Lab 07/14/21 0709 07/15/21 0812 07/17/21 0930  WBC 11.8* 11.0* 10.2  HGB 8.2* 8.3* 9.1*  HCT 28.1* 27.9* 29.1*  MCV 100.7* 100.0 98.0  PLT 272 310 306    CBG: Recent Labs  Lab 07/17/21 0633 07/17/21 1341 07/17/21 1642 07/17/21 2103 07/18/21 0642  GLUCAP 140* 141* 202* 166* 221*     Scheduled Meds:  apixaban  2.5 mg Per Tube BID   atorvastatin  40 mg Per Tube Daily   chlorhexidine  15 mL Mouth Rinse BID   Chlorhexidine Gluconate Cloth  6 each Topical Daily   darbepoetin (ARANESP) injection -  DIALYSIS  200 mcg Intravenous Q Thu-HD   feeding supplement  237 mL Oral BID BM   feeding supplement (PROSource TF)  45 mL Per Tube Daily   gabapentin  100 mg Per Tube Q12H   insulin aspart  0-6 Units Subcutaneous TID WC   lactulose  20 g Per Tube Daily   mouth rinse  15 mL Mouth Rinse q12n4p   midodrine  10 mg Per Tube TID WC   mirtazapine  7.5 mg Per Tube QHS   multivitamin  1 tablet Per Tube QHS   OLANZapine  2.5 mg Per Tube QHS  pantoprazole sodium  40 mg Per Tube Daily   sodium chloride flush  10-40 mL Intracatheter Q12H   thiamine  100 mg Per Tube Daily   venlafaxine  37.5 mg Per Tube BID   Continuous Infusions:  feeding supplement (OSMOLITE 1.5 CAL) 1,000 mL (07/17/21 0814)   iron sucrose Stopped (07/17/21 1500)    Principal Problem:   GI bleed Active Problems:   Essential hypertension   Type 2 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, with long-term current use of insulin (HCC)   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   ESRD on dialysis ([place])   Dyslipidemia   Hypokalemia   Stage 2 skin ulcer of sacral region [facility])   Cerebral thrombosis with cerebral infarction   Protein-calorie malnutrition, severe   Acute GI bleeding   Oropharyngeal dysphagia   Failure to thrive in adult   Inadequate oral intake   Clogged feeding tube   Fever   Rectal bleeding   Delirium due to another medical condition   Consultants: Vascular surgery Nephrology Gastroenterology Neurology Palliative medicine General surgery  Procedures: EEG Echocardiogram Cortrack EGD  Antibiotics: Ceftriaxone 8/3 through 8/5   Time spent: 25 minutes    Erin Hearing ANP  [facility] 7 am - 330 pm/M-F for direct patient care and secure chat Please refer to Amion for contact info 59  days

## 2021-07-18 NOTE — Progress Notes (Signed)
Nutrition Follow-up  DOCUMENTATION CODES:   Severe malnutrition in context of chronic illness  INTERVENTION:   -Continue Ensure Enlive po BID, each supplement provides 350 kcal and 20 grams of protein    Continue TF via NGT once replaced: -Osmolite 1.5 @ 30m/hr (12066md)  -4556mrosource TF daily   Provides 1840 kcals, 86g protein, 914m36mee water  NUTRITION DIAGNOSIS:   Severe Malnutrition related to chronic illness (ESRD on HD, prior CVA) as evidenced by percent weight loss, severe muscle depletion, energy intake < or equal to 75% for > or equal to 1 month.  Ongoing  GOAL:   Patient will meet greater than or equal to 90% of their needs  Progressing   MONITOR:   PO intake, Supplement acceptance, Diet advancement, Labs, Weight trends, Skin, I & O's  REASON FOR ASSESSMENT:   Consult Enteral/tube feeding initiation and management  ASSESSMENT:   75 y46female with PMH of ESRD on HD MWF, HTN, HLD, T2DM, CVA s/p TPA 02/2021, ?blood clot in UE, left hemiparesis from CVA who presented to ER for dark stool with + fecal occult.  8/17 - cortrak placed (gastric tip) 8/19 - cortrak replaced (gastric tip) 8/29 - cortrak replaced (gastric tip) 9/07 - s/p tunneled HD cath placement 9/12 - cortrak replaced (gastric tip confirmed via xray); diet advanced to regular  Reviewed I/O's: -2.8 L x 24 hours and -4.4 L since 07/04/21  Case discussed with AlliErin Hearing. Extensive discussions were had with pt family. Initially, plan was to remove cortrak and d/c home for comfort care. However, pysch evaluated pt and deemed that pt does not have the capacity to make medical decisions. Son is now in favor of g-tube placement, which will likely occur sometime next week. NP requesting replacement of cortrak tube today, as pt no longer has feeding access. Case discussed with cortrak team, who reports high consult volume today and recommending placement of NGT. NP ElliLissa Merlin ordered NGT placement  under fluoroscopy.     Medications reviewed and include lactulose  Labs reviewed: CBGS: [id number] orders for glycemic control are 0-96 units insulin aspart TID with meals).    Diet Order:   Diet Order             Diet regular Room service appropriate? Yes; Fluid consistency: Thin  Diet effective now                   EDUCATION NEEDS:   Education needs have been addressed  Skin:  Skin Assessment: Skin Integrity Issues: Skin Integrity Issues:: Stage II Stage II: Pressure Injury on coccyx; Open wound on buttocks  Last BM:  07/17/21 (type 7)  Height:   Ht Readings from Last 1 Encounters:  05/20/21 '5\' 7"'$  (1.702 m)    Weight:   Wt Readings from Last 1 Encounters:  07/17/21 97.5 kg   BMI:  Body mass index is 33.67 kg/m.  Estimated Nutritional Needs:   Kcal:  1700-1900  Protein:  85-100 grams  Fluid:  1L +UOP    JeniLoistine Chance, LDN, CDCEFrench Settlementistered Dietitian II Certified Diabetes Care and Education Specialist Please refer to AMION for RD and/or RD on-call/weekend/after hours pager

## 2021-07-18 NOTE — Progress Notes (Signed)
Spoke with CSW regarding possible plans for d/c to snf tomorrow. Contacted inpt HD unit who informed me that pt has not been receiving HD in the chair. Pt must receive HD in a chair unrestrained in order for pt's clinic to re-evaluate pt's case for possible return to clinic setting. Message left for CSW, Arbie Cookey, to discuss case further. Will continue to follow and assist.  Melven Sartorius Renal Navigator [phone]

## 2021-07-18 NOTE — Progress Notes (Signed)
Attempted placement of a 10 french feeding tube at the bedside.  Attempt was unsuccessful. RN notified.  Patient tolerated procedure well.

## 2021-07-18 NOTE — Progress Notes (Addendum)
11:45am: CSW attempted to reach patient's son [name] via phone without success - a voicemail was left requesting a return call.  11am: CSW was informed by NP that per psych, patient does not have capacity for decision making.  10:30am: CSW received call from Santiago Glad at [facility] who is requesting PT notes - CSW informed Santiago Glad that note would be submitted once it was completed.  9:30am: CSW spoke with NP to request a PT order be placed for insurance authorization.  CSW spoke with Juliann Pulse at [facility] who states the patient can return to the facility once insurance authorization has been obtained.  CSW submitted clinicals to [facility] to initiate insurance authorization.  CSW spoke with Olivia Mackie - renal navigator to inform her of discharge plan.  Madilyn Fireman, MSW, LCSW Transitions of Care  Clinical Social Worker II [phone]

## 2021-07-18 NOTE — Progress Notes (Signed)
Requested by hospitalist service to see this patient for possible g-tube placement again.   Patient is known to our service. Last seen on 9/12. Please see progress note for more details. At that time patient deemed by Psych to have capacity and patient did not want g-tube placed.   TR[facility] reports that during visit with psych today she was deemed to no longer have capacity (note still pending) and family wishes to proceed with g-tube.   She is currently on Eliquis with the last dose this am. Please hold. Discussed with attending, given a new surgeon will be covering next week, our team will plan to evaluate on Monday. Discussed with [facility] who felt this was reasonable.

## 2021-07-19 DIAGNOSIS — K922 Gastrointestinal hemorrhage, unspecified: Secondary | ICD-10-CM | POA: Diagnosis not present

## 2021-07-19 LAB — RENAL FUNCTION PANEL
Albumin: 1.8 g/dL — ABNORMAL LOW (ref 3.5–5.0)
Anion gap: 8 (ref 5–15)
BUN: 36 mg/dL — ABNORMAL HIGH (ref 8–23)
CO2: 29 mmol/L (ref 22–32)
Calcium: 8.5 mg/dL — ABNORMAL LOW (ref 8.9–10.3)
Chloride: 96 mmol/L — ABNORMAL LOW (ref 98–111)
Creatinine, Ser: 3.26 mg/dL — ABNORMAL HIGH (ref 0.44–1.00)
GFR, Estimated: 14 mL/min — ABNORMAL LOW (ref >60)
Glucose, Bld: 80 mg/dL (ref 70–99)
Phosphorus: 3 mg/dL (ref 2.5–4.6)
Potassium: 6 mmol/L — ABNORMAL HIGH (ref 3.5–5.1)
Sodium: 133 mmol/L — ABNORMAL LOW (ref 135–145)

## 2021-07-19 LAB — CBC
HCT: 27.4 % — ABNORMAL LOW (ref 36.0–46.0)
HCT: 28.7 % — ABNORMAL LOW (ref 36.0–46.0)
Hemoglobin: 8 g/dL — ABNORMAL LOW (ref 12.0–15.0)
Hemoglobin: 8.5 g/dL — ABNORMAL LOW (ref 12.0–15.0)
MCH: 28.7 pg (ref 26.0–34.0)
MCH: 29.1 pg (ref 26.0–34.0)
MCHC: 29.2 g/dL — ABNORMAL LOW (ref 30.0–36.0)
MCHC: 29.6 g/dL — ABNORMAL LOW (ref 30.0–36.0)
MCV: 98.2 fL (ref 80.0–100.0)
MCV: 98.3 fL (ref 80.0–100.0)
Platelets: 248 10*3/uL (ref 150–400)
Platelets: 265 10*3/uL (ref 150–400)
RBC: 2.79 MIL/uL — ABNORMAL LOW (ref 3.87–5.11)
RBC: 2.92 MIL/uL — ABNORMAL LOW (ref 3.87–5.11)
RDW: 18.9 % — ABNORMAL HIGH (ref 11.5–15.5)
RDW: 18.8 % — ABNORMAL HIGH (ref 11.5–15.5)
WBC: 14.4 10*3/uL — ABNORMAL HIGH (ref 4.0–10.5)
WBC: 14.7 10*3/uL — ABNORMAL HIGH (ref 4.0–10.5)
nRBC: 0 % (ref 0.0–0.2)
nRBC: 0.2 % (ref 0.0–0.2)

## 2021-07-19 LAB — APTT
aPTT: >200 seconds (ref 24–36)
aPTT: >200 seconds (ref 24–36)
aPTT: 162 seconds (ref 24–36)

## 2021-07-19 LAB — GLUCOSE, CAPILLARY
Glucose-Capillary: 52 mg/dL — ABNORMAL LOW (ref 70–99)
Glucose-Capillary: 125 mg/dL — ABNORMAL HIGH (ref 70–99)
Glucose-Capillary: 61 mg/dL — ABNORMAL LOW (ref 70–99)
Glucose-Capillary: 97 mg/dL (ref 70–99)
Glucose-Capillary: 52 mg/dL — ABNORMAL LOW (ref 70–99)
Glucose-Capillary: 73 mg/dL (ref 70–99)
Glucose-Capillary: 54 mg/dL — ABNORMAL LOW (ref 70–99)
Glucose-Capillary: 126 mg/dL — ABNORMAL HIGH (ref 70–99)

## 2021-07-19 LAB — HEPARIN LEVEL (UNFRACTIONATED)
Heparin Unfractionated: >1.1 IU/mL — ABNORMAL HIGH (ref 0.30–0.70)
Heparin Unfractionated: >1.1 IU/mL — ABNORMAL HIGH (ref 0.30–0.70)
Heparin Unfractionated: >1.1 IU/mL — ABNORMAL HIGH (ref 0.30–0.70)

## 2021-07-19 LAB — POTASSIUM: Potassium: 6 mmol/L — ABNORMAL HIGH (ref 3.5–5.1)

## 2021-07-19 MED ORDER — LIDOCAINE-PRILOCAINE 2.5-2.5 % EX CREA: 1.0000 "application " | TOPICAL_CREAM | CUTANEOUS | Status: DC | PRN

## 2021-07-19 MED ORDER — DEXTROSE 50 % IV SOLN: 25.0000 g | INTRAVENOUS | Status: AC

## 2021-07-19 MED ORDER — DEXTROSE 50 % IV SOLN
25.0000 g | INTRAVENOUS | Status: AC
  Administered 2021-07-19: 25 g via INTRAVENOUS
  Filled 2021-07-19: qty 50

## 2021-07-19 MED ORDER — DEXTROSE 10 % IV SOLN
INTRAVENOUS | Status: DC
  Administered 2021-07-28: 1000 mL via INTRAVENOUS

## 2021-07-19 MED ORDER — SODIUM CHLORIDE 0.9 % IV SOLN: 100.0000 mL | INTRAVENOUS | Status: DC | PRN

## 2021-07-19 MED ORDER — HEPARIN SODIUM (PORCINE) 1000 UNIT/ML DIALYSIS: 1000.0000 [IU] | INTRAMUSCULAR | Status: DC | PRN

## 2021-07-19 MED ORDER — ALTEPLASE 2 MG IJ SOLR: 2.0000 mg | Freq: Once | INTRAMUSCULAR | Status: AC | PRN

## 2021-07-19 MED ORDER — DEXTROSE 50 % IV SOLN
INTRAVENOUS | Status: AC
  Filled 2021-07-19: qty 50

## 2021-07-19 MED ORDER — PENTAFLUOROPROP-TETRAFLUOROETH EX AERO: 1.0000 "application " | INHALATION_SPRAY | CUTANEOUS | Status: DC | PRN

## 2021-07-19 MED ORDER — HEPARIN (PORCINE) 25000 UT/250ML-% IV SOLN
1000.0000 [IU]/h | INTRAVENOUS | Status: DC
  Administered 2021-07-19: 1000 [IU]/h via INTRAVENOUS
  Filled 2021-07-19: qty 250

## 2021-07-19 MED ORDER — SODIUM ZIRCONIUM CYCLOSILICATE 10 G PO PACK: 10.0000 g | PACK | Freq: Two times a day (BID) | ORAL | Status: DC

## 2021-07-19 MED ORDER — LIDOCAINE HCL (PF) 1 % IJ SOLN: 5.0000 mL | INTRAMUSCULAR | Status: DC | PRN

## 2021-07-19 MED ORDER — HEPARIN (PORCINE) 25000 UT/250ML-% IV SOLN
1100.0000 [IU]/h | INTRAVENOUS | Status: DC
  Administered 2021-07-20: 800 [IU]/h via INTRAVENOUS
  Administered 2021-07-25: 850 [IU]/h via INTRAVENOUS
  Administered 2021-07-26: 900 [IU]/h via INTRAVENOUS
  Administered 2021-07-27 – 2021-08-01 (×6): 1150 [IU]/h via INTRAVENOUS
  Administered 2021-08-02: 1050 [IU]/h via INTRAVENOUS
  Administered 2021-08-03: 1100 [IU]/h via INTRAVENOUS
  Filled 2021-07-19 (×16): qty 250

## 2021-07-19 MED ORDER — DEXTROSE 50 % IV SOLN
INTRAVENOUS | Status: AC
  Administered 2021-07-19: 50 mL
  Filled 2021-07-19: qty 50

## 2021-07-19 MED ORDER — SODIUM POLYSTYRENE SULFONATE 15 GM/60ML PO SUSP
60.0000 g | Freq: Once | ORAL | Status: AC
  Administered 2021-07-19: 60 g via RECTAL
  Filled 2021-07-19: qty 240

## 2021-07-19 MED ORDER — DEXTROSE 50 % IV SOLN
12.5000 g | INTRAVENOUS | Status: AC
  Administered 2021-07-19: 12.5 g via INTRAVENOUS

## 2021-07-19 MED ORDER — DEXTROSE 50 % IV SOLN
INTRAVENOUS | Status: AC
  Administered 2021-07-19: 25 mL
  Filled 2021-07-19: qty 50

## 2021-07-19 NOTE — Progress Notes (Signed)
Received call from Pharmacy,instructed to pause Heparin and restart at 22:00 at 8cc/hr.

## 2021-07-19 NOTE — Progress Notes (Addendum)
[facility] Progress Note   Subjective:    Seen and examined patient at bedside. No acute changes.  Objective Vitals:   07/18/21 1857 07/18/21 2114 07/19/21 0543 07/19/21 0949  BP: (!) 150/64 (!) 153/100 (!) 130/54 134/67  Pulse: 98 98 (!) 107 91  Resp: '18 18 19 18  '$ Temp: 98.8 F (37.1 C) 99.1 F (37.3 C) 98.5 F (36.9 C) 97.9 F (36.6 C)  TempSrc: Axillary  Oral   SpO2: 95% 92% 96% 100%  Weight:      Height:       Physical Exam General:chronically ill appearing female in NAD Heart:RRR, no mrg Lungs:mostly CTAB anterolaterally  Abdomen:soft, NTND Extremities:1+ edema in hips/thighs and UE Dialysis Access: [facility] in use   Filed Weights   07/17/21 0907 07/17/21 1244 07/17/21 2100  Weight: 82.1 kg 80 kg 97.5 kg    Intake/Output Summary (Last 24 hours) at 07/19/2021 1549 Last data filed at 07/19/2021 1300 Gross per 24 hour  Intake 260.39 ml  Output 0 ml  Net 260.39 ml    Additional Objective Labs: Basic Metabolic Panel: Recent Labs  Lab 07/15/21 0812 07/17/21 0930 07/19/21 0421  NA 130* 128* 133*  K 6.5* 6.3* 6.0*  CL 93* 90* 96*  CO2 '29 30 29  '$ GLUCOSE 137* 147* 80  BUN 39* 39* 36*  CREATININE 3.51* 3.18* 3.26*  CALCIUM 8.4* 8.3* 8.5*  PHOS 2.4* 2.7 3.0   Liver Function Tests: Recent Labs  Lab 07/15/21 0812 07/17/21 0930 07/19/21 0421  ALBUMIN 1.8* 1.9* 1.8*   No results for input(s): LIPASE, AMYLASE in the last 168 hours. CBC: Recent Labs  Lab 07/14/21 0709 07/15/21 0812 07/17/21 0930 07/19/21 0421 07/19/21 1450  WBC 11.8* 11.0* 10.2 14.4* 14.7*  HGB 8.2* 8.3* 9.1* 8.0* 8.5*  HCT 28.1* 27.9* 29.1* 27.4* 28.7*  MCV 100.7* 100.0 98.0 98.2 98.3  PLT 272 310 306 248 265   Blood Culture    Component Value Date/Time   SDES BLOOD RIGHT ANTECUBITAL 06/26/2021 1234   SPECREQUEST  06/26/2021 1234    BOTTLES DRAWN AEROBIC AND ANAEROBIC Blood Culture adequate volume   CULT  06/26/2021 1234    NO GROWTH 5 DAYS Performed at [facility], [place] [address] [place], [place] [address]    REPTSTATUS 07/01/2021 FINAL 06/26/2021 1234    Cardiac Enzymes: No results for input(s): CKTOTAL, CKMB, CKMBINDEX, TROPONINI in the last 168 hours. CBG: Recent Labs  Lab 07/19/21 0221 07/19/21 0256 07/19/21 0640 07/19/21 0724 07/19/21 1134  GLUCAP 52* 125* 61* 97 52*   Iron Studies: No results for input(s): IRON, TIBC, TRANSFERRIN, FERRITIN in the last 72 hours. Lab Results  Component Value Date   INR 0.9 03/10/2021   INR 1.0 01/26/2021   Studies/Results: No results found.  Medications:  sodium chloride     sodium chloride     dextrose 30 mL/hr at 07/19/21 1149   feeding supplement (OSMOLITE 1.5 CAL)     heparin 1,000 Units/hr (07/19/21 1109)   iron sucrose Stopped (07/17/21 1500)    atorvastatin  40 mg Per Tube Daily   chlorhexidine  15 mL Mouth Rinse BID   Chlorhexidine Gluconate Cloth  6 each Topical Daily   darbepoetin (ARANESP) injection - DIALYSIS  200 mcg Intravenous Q Thu-HD   feeding supplement  237 mL Oral BID BM   feeding supplement (PROSource TF)  45 mL Per Tube Daily   gabapentin  100 mg Per Tube Q12H   insulin aspart  0-6  Units Subcutaneous TID WC   lactulose  20 g Per Tube Daily   mouth rinse  15 mL Mouth Rinse q12n4p   midodrine  10 mg Per Tube TID WC   mirtazapine  7.5 mg Per Tube QHS   multivitamin  1 tablet Per Tube QHS   OLANZapine  2.5 mg Per Tube QHS   pantoprazole sodium  40 mg Per Tube Daily   sodium chloride flush  10-40 mL Intracatheter Q12H   thiamine  100 mg Per Tube Daily   venlafaxine  37.5 mg Per Tube BID    Dialysis Orders: AF MWF  3h 59mn 400/500    62.5kg    3K/2.5 Ca    P2  AVG HeRO   Hep none - Mircera 50 q 2 wks  (7/13)  Venofer 50 q wk - Parsabiv '5mg'$  IV q HD  Assessment/Plan: AMS/FTT: Mental status waxes/wanes. Also uses restraints on/off which has caused her to be in bed occasionally for HD. Pt will have to be able to sit in a chair for 4-6 hrs without  restraints to qualify for OP HD w/ our group.  If she continues to tolerate sitting in the chair for dialysis and cooperate, then will re-CLIP the patient as her OP HD unit will not take her back. HD Nurse Coordinator and Renal Navigator following, stated prior to re-clipping her, we need to ensure patient nutritional status is stable. Core Track tube feeding has been pulled.  Melena/Rectal bleeding - GI consulted. Hx rectal ulcers.  EGD 7/27 normal, flex sig with mucosal ulceration c/w sterocoral ulcers, non bleeding. Hgb 8s, stable. ESRD: HD TTS while here. Noted K+ this AM 6.0-ordered Kayexalate enema 60GM X 1 today. Plan to re-check K+ in AM. Due to HD census and low staffing, plan for HD either tomorrow or 9/26 AM shift. Dysphagia -  Patient refusing G-tube placement.  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 AVG clotted 9/2, unable to be declotted d/t arm contracture. S/P conversion to tunneled HD catheter 9/7 via IR-on Heparin drip, appreciate their assistance.  Hypertension/volume: BP stable, on midodrine '10mg'$  TID with significant overload on exam.  Continue to maximize UF as tolerated, can give albumin with HD for BP support.   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  Getting TFs via Core Track-RD managing tube. Refuses G-tube. 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 9. Psych eval from 9/14 determined that patient has functional mental capacity for medical decision-making.  2nd evaluation by psych agreed with assessment. On 07/18/21, the hospitalist team and myself were at patient's bedside. She appears more confused-it was determined that she is unable to make medical decisions. 10. Anemia: Continue Aranesp-dose increase to 200 mcg q week on 9/15.  Last Hgb 8.5.   11. Dispo: DNR/DNI. Poor prognosis. Palliative care has seen see #1. Appreciate their involvement. Although patient is currently tolerating being in the chair for HD, we do  not believe dialysis is adding to her quality of life. Per family communication, family will consider stopping HD if patient continues to refuse eating and G tube placement.  Patient is refusing G-tube placement which places another barrier [facility] Ongoing discussions with multi-discipline team. Hospitalist team contacted son yesterday d/t patient being more confused. Plan for primary team to re-consult Surgery for Gtube placement per Son's request. Reviewed last surgery note-plan to re-consult on Monday.  CTobie Poet  NP [facility] 07/19/2021,3:49 PM  LOS: 60 days

## 2021-07-19 NOTE — Progress Notes (Signed)
Received call from lab,patient's APTT >200. Pharm,Minh Q made aware. Pharmacist to place another APTT lab draw.

## 2021-07-19 NOTE — Progress Notes (Signed)
Hypoglycemic Event  CBG: 54  Treatment: D50 50 mL (25 gm)  Symptoms: Sweaty  Follow-up CBG: Time:2143 CBG Result:126  Possible Reasons for Event: Inadequate meal intake  Comments/MD notified:Per hypoglycemia protocol    Derral Colucci Joselita

## 2021-07-19 NOTE — Progress Notes (Signed)
Received call from lab,patient's APTT=162. Pharmacy notified,MD,Shalhoub notified via secure chat.

## 2021-07-19 NOTE — Progress Notes (Signed)
ANTICOAGULATION CONSULT NOTE Pharmacy Consult for Heparin Indication:  h/o CVA and chronic UE thrombus  Allergies  Allergen Reactions   Sensipar [Cinacalcet]     Per MAR   Statins     Per [facility]    Patient Measurements: Height: '5\' 7"'$  (170.2 cm) Weight: 97.5 kg (215 lb) IBW/kg (Calculated) : 61.6 Heparin Dosing Weight:  85 kg  Vital Signs: Temp: 97.9 F (36.6 C) (09/24 0949) Temp Source: Oral (09/24 0543) BP: 134/67 (09/24 0949) Pulse Rate: 91 (09/24 0949)  Labs: Recent Labs    07/17/21 0930 07/19/21 0421 07/19/21 0729  HGB 9.1* 8.0*  --   HCT 29.1* 27.4*  --   PLT 306 248  --   APTT  --  >200* >200*  HEPARINUNFRC  --  >1.10* >1.10*  CREATININE 3.18* 3.26*  --      Estimated Creatinine Clearance: 18.4 mL/min (A) (by C-G formula based on SCr of 3.26 mg/dL (H)).  Assessment: 75 y.o. female with hx CVA and chronic UE thrombus.  Patient with prolonged hospitalization and difficult disposition.  Family has elected to pursue PEG tube placement for ongoing nutritional needs.  Surgery consulted.  Will stop Eliquis and bridge with heparin pending surgical plans.    Last dose Eliquis 9/23 at 0900. Heparin started at 2,100 units/h 9/23. Rate was intended to be 1,000 units/h and started at 2100 (time). This resulted in aPTT>200, which was initially thought to be due to labs having to be drawn from the same side as heparin was running due to AV fistula on other side. After discovering the error, RN was contacted and asked to hold heparin for 1 hour and restart heparin at rate initially intended (1,000 U/h). Hgb 8.0, PLT WNL. No signs of bleeding reported by RN. Will follow-up for any signs of bleeding again later in the day.   Goal of Therapy:  aPTT 66-102 Heparin level 0.3-0.7 units/ml Monitor platelets by anticoagulation protocol: Yes   Plan:  HOLD heparin until 1100 9/24  START heparin at 1,000 units/hr  Check CBC '@1500'$   Check aPTT, heparin level in 8 hours Daily heparin  levels, aPTT, and CBC while on heparin  Adria Dill, PharmD PGY-1 Acute Care Resident  07/19/2021 10:05 AM

## 2021-07-19 NOTE — Progress Notes (Signed)
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  She had altered mental status seen by neurology MRI similar to prior MRI at [facility] with a large right MCA stroke, seen by nephrology for dialysis need.  Patient having ongoing confusion weakness inability to sit in dialysis recliner and so not accepted back to outpatient dialysis at this time and awaiting further improvement.  She has had poor intake failure to thrive family wanted PEG tube I had tried but was unable to place because of overlying transverse colon and has been on core track NG tube feeding.  Seen by surgery for G-tube placement.  Core track was clogged and changed 07/07/21. 9/13-psych consulted for competency and patient does have functional mental capacity for medical decision and has refused G tube. Extensive assessment done at bedside 9/23 with psychiatry primary team and nephrology, and felt that patient on ultimate medical decision  Subjective: Seen examined this morning she appears more alert awake this morning took some pills. Hyperglycemia 52, 61 overnight Potassium at 6.0- nephro aware Patient afebrile, heparin level high Feeding tube unsuccessful last night.  Assessment & Plan:  Acute on chronic lower GI bleeding from stercoral ulcer S/P EGD and flex sigmoidoscopy.HH stable  Anemia of chronic renal disease: HH stable to low. 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  Able to answer some questions but appears confused encephalopathic.  Currently on heparin gtt, cont Lipitor and supportive care.  Continue Zyprexa per psychiatry.    Hypoglycemia-after tube feed has been stopped.  Started on D10 Recent Labs  Lab 07/18/21 2111 07/19/21 0221 07/19/21 0256 07/19/21 0640 07/19/21 0724  GLUCAP 75 52* 125* 61* 97   Leukocytosis but afebrile.  Monitor Recent Labs  Lab 07/14/21 0709 07/15/21 0812 07/17/21 0930 07/19/21 0421  WBC 11.8* 11.0* 10.2 14.4*    Failure to thrive/generalized deconditioning, poor oral intake  Severe protein calorie malnutrition Dysphagia ruled out speech eval 8/25 Patient adamant on not eating hospital food and states "I will eat food from home " but she has not eaten in several weeks.  Unable to replace 9/23, will need IR consult on Monday. Surgery has been consulted for G-tube placement as wished by patient's POA son.  ESRD on HD MWF .now on TTS nephrology notified patient hyperkalemic this morning.  She has been tolerating dialysis up in the chair as a requirement for outpatient dialysis.  Hyperkalemia -potassium 6.0, I notified nephrology.   Constipation-resolved Hypotension continue midodrine.  Monitor blood pressure  Type 2 diabetes mellitus with episodes of hypoglycemia A1c 5.1, blood sugar low starting D10 not Recent Labs  Lab 07/18/21 2111 07/19/21 0221 07/19/21 0256 07/19/21 0640 07/19/21 0724  GLUCAP 75 52* 125* 61* 97     Chronic thrombus left upper extremity transitioned to heparin holding Eliquis in case for G-tube placement GERD -cont ppi Stage II mid  coccyx pressure injury GOC: currently DNR.  Followed by palliative care.  Overall prognosis guarded/poor, son has been wanting ongoing dialysis and feeding tube placement for nutrition.  He is aware  about risk of decompensation, high risk of readmission  DTP-TOC following  Diet Order             Diet regular Room service appropriate? Yes; Fluid consistency: Thin  Diet effective now                   Nutrition Problem: Severe Malnutrition Etiology: chronic illness (ESRD on HD, prior CVA) Signs/Symptoms: percent weight loss, severe muscle depletion, energy intake < or equal to 75% for > or equal to 1 month Percent weight loss: 16.4 % Interventions: Nepro shake, Magic cup, MVI Patient's Body mass index is 33.67 kg/m.  DVT prophylaxis: Place TED hose Start: 05/19/21 1706 Code Status:   Code Status: DNR  Family Communication: plan of care discussed during the multidisciplinary meeting,   Remains inpatient appropriate because:Inpatient level of care appropriate due to severity of illness  Dispo: The patient is from: Home              Anticipated d/c is to: SNF              Patient currently is not medically stable   Difficult to place patient Yes Unresulted Labs (From admission, onward)     Start     Ordered   07/20/21 0500  Heparin level (unfractionated)  Daily,   R     Question:  Specimen collection method  Answer:  Lab=Lab collect   07/18/21 1301   07/20/21 0500  CBC  Daily,   R     Question:  Specimen collection method  Answer:  Lab=Lab collect   07/18/21 1301   07/20/21 0500  APTT  Daily,   R     Question:  Specimen collection method  Answer:  Lab=Lab collect   07/18/21 1301   07/19/21 1900  Heparin level (unfractionated)  Once-Timed,   TIMED       Question:  Specimen collection method  Answer:  Lab=Lab collect   07/19/21 1003   07/19/21 1900  APTT  Once-Timed,   TIMED       Question:  Specimen collection method  Answer:  Lab=Lab collect   07/19/21 1003   07/19/21 1500  CBC  Once,   R       Question:  Specimen collection method  Answer:  Lab=Lab collect   07/19/21 1013            Medications reviewed:  Scheduled Meds:  atorvastatin  40 mg Per Tube  Daily   chlorhexidine  15 mL Mouth Rinse BID   Chlorhexidine Gluconate Cloth  6 each Topical Daily   darbepoetin (ARANESP) injection - DIALYSIS  200 mcg Intravenous Q Thu-HD   feeding supplement  237 mL Oral BID BM   feeding supplement (PROSource TF)  45 mL Per Tube Daily   gabapentin  100 mg Per Tube Q12H   insulin aspart  0-6 Units Subcutaneous TID WC   lactulose  20 g Per Tube Daily   mouth rinse  15 mL Mouth Rinse q12n4p   midodrine  10 mg Per Tube TID WC   mirtazapine  7.5 mg Per Tube QHS   multivitamin  1 tablet Per Tube QHS   OLANZapine  2.5 mg Per Tube QHS   pantoprazole sodium  40 mg Per Tube Daily   sodium chloride  flush  10-40 mL Intracatheter Q12H   thiamine  100 mg Per Tube Daily   venlafaxine  37.5 mg Per Tube BID   Continuous Infusions:  feeding supplement (OSMOLITE 1.5 CAL)     heparin 1,000 Units/hr (07/19/21 1109)   iron sucrose Stopped (07/17/21 1500)   Consultants:see note  Procedures:see note Antimicrobials: Anti-infectives (From admission, onward)    Start     Dose/Rate Route Frequency Ordered Stop   07/02/21 1116  ceFAZolin (ANCEF) IVPB 2g/100 mL premix        over 30 Minutes Intravenous Continuous PRN 07/02/21 1139 07/02/21 1116   07/02/21 1111  ceFAZolin (ANCEF) 2-4 GM/100ML-% IVPB       Note to Pharmacy: Domenick Bookbinder   : cabinet override      07/02/21 1111 07/02/21 2314   07/01/21 1215  ceFAZolin (ANCEF) IVPB 2g/100 mL premix        2 g 200 mL/hr over 30 Minutes Intravenous On call 07/01/21 0902 07/02/21 1215   05/29/21 1622  ceFAZolin (ANCEF) 2-4 GM/100ML-% IVPB       Note to Pharmacy: Lytle Butte   : cabinet override      05/29/21 1622 05/30/21 0429   05/29/21 0000  cefTRIAXone (ROCEPHIN) 1 g in sodium chloride 0.9 % 100 mL IVPB  Status:  Discontinued        1 g 200 mL/hr over 30 Minutes Intravenous Every 24 hours 05/28/21 1801 05/28/21 1801   05/28/21 1830  cefTRIAXone (ROCEPHIN) 1 g in sodium chloride 0.9 % 100 mL IVPB        1 g 200  mL/hr over 30 Minutes Intravenous Every 24 hours 05/28/21 1801 05/30/21 1907   05/28/21 1630  ceFAZolin (ANCEF) IVPB 2g/100 mL premix        2 g 200 mL/hr over 30 Minutes Intravenous To Radiology 05/28/21 1537 05/29/21 1630      Culture/Microbiology    Component Value Date/Time   SDES BLOOD RIGHT ANTECUBITAL 06/26/2021 1234   SPECREQUEST  06/26/2021 1234    BOTTLES DRAWN AEROBIC AND ANAEROBIC Blood Culture adequate volume   CULT  06/26/2021 1234    NO GROWTH 5 DAYS Performed at [facility], [place] [address], [place], [place] [address]    REPTSTATUS 07/01/2021 FINAL 06/26/2021 1234    Other culture-see note  Objective: Vitals: Today's Vitals   07/18/21 2008 07/18/21 2114 07/19/21 0543 07/19/21 0949  BP:  (!) 153/100 (!) 130/54 134/67  Pulse:  98 (!) 107 91  Resp:  '18 19 18  '$ Temp:  99.1 F (37.3 C) 98.5 F (36.9 C) 97.9 F (36.6 C)  TempSrc:   Oral   SpO2:  92% 96% 100%  Weight:      Height:      PainSc: 0-No pain       Intake/Output Summary (Last 24 hours) at 07/19/2021 1125 Last data filed at 07/19/2021 0900 Gross per 24 hour  Intake 260.39 ml  Output 0 ml  Net 260.39 ml    Filed Weights   07/17/21 0907 07/17/21 1244 07/17/21 2100  Weight: 82.1 kg 80 kg 97.5 kg   Weight change:   Intake/Output from previous day: 09/23 0701 - 09/24 0700 In: 922.1 [P.O.:180; I.V.:200.4; NG/GT:541.7] Out: 0  Intake/Output this shift: No intake/output data recorded. Filed Weights   07/17/21 0907 07/17/21 1244 07/17/21 2100  Weight: 82.1 kg 80 kg 97.5 kg   Examination: General exam: Alert awake able to tell me her name that she is in the  hospital, pleasantly confused  HEENT:Oral mucosa moist, Ear/Nose WNL grossly, dentition normal. Respiratory system: bilaterally diminished, no use of accessory muscle Cardiovascular system: S1 & S2 +, No JVD,. Gastrointestinal system: Abdomen soft, NT,ND, BS+ Nervous System:Alert, awake, spastic contracted left upper extremity and  left side Extremities: Edematous upper extremities bilaterally distal peripheral pulses palpable.  Skin: No rashes,no icterus. MSK: Normal muscle bulk,tone, power  HD catheter+  Data Reviewed: I have personally reviewed following labs and imaging studies CBC: Recent Labs  Lab 07/14/21 0709 07/15/21 0812 07/17/21 0930 07/19/21 0421  WBC 11.8* 11.0* 10.2 14.4*  HGB 8.2* 8.3* 9.1* 8.0*  HCT 28.1* 27.9* 29.1* 27.4*  MCV 100.7* 100.0 98.0 98.2  PLT 272 310 306 [id number]   Basic Metabolic Panel: Recent Labs  Lab 07/15/21 0812 07/17/21 0930 07/19/21 0421  NA 130* 128* 133*  K 6.5* 6.3* 6.0*  CL 93* 90* 96*  CO2 '29 30 29  '$ GLUCOSE 137* 147* 80  BUN 39* 39* 36*  CREATININE 3.51* 3.18* 3.26*  CALCIUM 8.4* 8.3* 8.5*  PHOS 2.4* 2.7 3.0    GFR: Estimated Creatinine Clearance: 18.4 mL/min (A) (by C-G formula based on SCr of 3.26 mg/dL (H)). Liver Function Tests: Recent Labs  Lab 07/15/21 0812 07/17/21 0930 07/19/21 0421  ALBUMIN 1.8* 1.9* 1.8*    No results for input(s): LIPASE, AMYLASE in the last 168 hours. No results for input(s): AMMONIA in the last 168 hours. Coagulation Profile: No results for input(s): INR, PROTIME in the last 168 hours. Cardiac Enzymes: No results for input(s): CKTOTAL, CKMB, CKMBINDEX, TROPONINI in the last 168 hours. BNP (last 3 results) No results for input(s): PROBNP in the last 8760 hours. HbA1C: No results for input(s): HGBA1C in the last 72 hours. CBG: Recent Labs  Lab 07/18/21 2111 07/19/21 0221 07/19/21 0256 07/19/21 0640 07/19/21 0724  GLUCAP 75 52* 125* 61* 97    Lipid Profile: No results for input(s): CHOL, HDL, LDLCALC, TRIG, CHOLHDL, LDLDIRECT in the last 72 hours. Thyroid Function Tests: No results for input(s): TSH, T4TOTAL, FREET4, T3FREE, THYROIDAB in the last 72 hours. Anemia Panel: No results for input(s): VITAMINB12, FOLATE, FERRITIN, TIBC, IRON, RETICCTPCT in the last 72 hours. Sepsis Labs: No results for input(s):  PROCALCITON, LATICACIDVEN in the last 168 hours.  Recent Results (from the past 240 hour(s))  Resp Panel by RT-PCR (Flu A&B, Covid) Nasopharyngeal Swab     Status: None   Collection Time: 07/18/21 10:11 AM   Specimen: Nasopharyngeal Swab; Nasopharyngeal(NP) swabs in vial transport medium  Result Value Ref Range Status   SARS Coronavirus 2 by RT PCR NEGATIVE NEGATIVE Final    Comment: (NOTE) SARS-CoV-2 target nucleic acids are NOT DETECTED.  The SARS-CoV-2 RNA is generally detectable in upper respiratory specimens during the acute phase of infection. The lowest concentration of SARS-CoV-2 viral copies this assay can detect is 138 copies/mL. A negative result does not preclude SARS-Cov-2 infection and should not be used as the sole basis for treatment or other patient management decisions. A negative result may occur with  improper specimen collection/handling, submission of specimen other than nasopharyngeal swab, presence of viral mutation(s) within the areas targeted by this assay, and inadequate number of viral copies(<138 copies/mL). A negative result must be combined with clinical observations, patient history, and epidemiological information. The expected result is Negative.  Fact Sheet for Patients:  EntrepreneurPulse.com.au  Fact Sheet for Healthcare Providers:  IncredibleEmployment.be  This test is no t yet approved or cleared by the [place] and  has been authorized for detection and/or diagnosis of SARS-CoV-2 by FDA under an Emergency Use Authorization (EUA). This EUA will remain  in effect (meaning this test can be used) for the duration of the COVID-19 declaration under Section 564(b)(1) of the Act, 21 U.S.C.section 360bbb-3(b)(1), unless the authorization is terminated  or revoked sooner.       Influenza A by PCR NEGATIVE NEGATIVE Final   Influenza B by PCR NEGATIVE NEGATIVE Final    Comment: (NOTE) The Xpert Xpress  SARS-CoV-2/FLU/RSV plus assay is intended as an aid in the diagnosis of influenza from Nasopharyngeal swab specimens and should not be used as a sole basis for treatment. Nasal washings and aspirates are unacceptable for Xpert Xpress SARS-CoV-2/FLU/RSV testing.  Fact Sheet for Patients: EntrepreneurPulse.com.au  Fact Sheet for Healthcare Providers: IncredibleEmployment.be  This test is not yet approved or cleared by the [place] FDA and has been authorized for detection and/or diagnosis of SARS-CoV-2 by FDA under an Emergency Use Authorization (EUA). This EUA will remain in effect (meaning this test can be used) for the duration of the COVID-19 declaration under Section 564(b)(1) of the Act, 21 U.S.C. section 360bbb-3(b)(1), unless the authorization is terminated or revoked.  Performed at [facility], [place] [address] [place], [place] [address]        Radiology Studies: No results found.   LOS: 60 days   Antonieta Pert, MD [facility]  07/19/2021, 11:25 AM

## 2021-07-19 NOTE — Progress Notes (Signed)
Hypoglycemic Event  CBG: 52  Treatment: D50 50 mL (25 gm)  Symptoms: None  Follow-up CBG: Time:0256 CBG Result:125  Possible Reasons for Event: Inadequate meal intake  Comments/MD notified: Per hypoglycemia protocol    Amy [name] Joselita

## 2021-07-19 NOTE — Progress Notes (Signed)
ANTICOAGULATION CONSULT NOTE Pharmacy Consult for Heparin Indication:  h/o CVA and chronic UE thrombus  Allergies  Allergen Reactions   Sensipar [Cinacalcet]     Per MAR   Statins     Per [facility]    Patient Measurements: Height: '5\' 7"'$  (170.2 cm) Weight: 97.5 kg (215 lb) IBW/kg (Calculated) : 61.6 Heparin Dosing Weight:  85 kg  Vital Signs: Temp: 98.6 F (37 C) (09/24 1615) BP: 128/69 (09/24 1615) Pulse Rate: 89 (09/24 1615)  Labs: Recent Labs    07/17/21 0930 07/19/21 0421 07/19/21 0729 07/19/21 1450 07/19/21 1934  HGB 9.1* 8.0*  --  8.5*  --   HCT 29.1* 27.4*  --  28.7*  --   PLT 306 248  --  265  --   APTT  --  >200* >200*  --  162*  HEPARINUNFRC  --  >1.10* >1.10*  --   --   CREATININE 3.18* 3.26*  --   --   --      Estimated Creatinine Clearance: 18.4 mL/min (A) (by C-G formula based on SCr of 3.26 mg/dL (H)).  Assessment: 75 y.o. female with hx CVA and chronic UE thrombus.  Patient with prolonged hospitalization and difficult disposition.  Family has elected to pursue PEG tube placement for ongoing nutritional needs.  Surgery consulted.  Will stop Eliquis and bridge with heparin pending surgical plans.    Heparin level came back high as expected with recent DOAC, aPTT still elevated at 162, on 1000 units/hr. No s/sx of bleeding or infusion issues per nursing.   Goal of Therapy:  aPTT 66-102 Heparin level 0.3-0.7 units/ml Monitor platelets by anticoagulation protocol: Yes   Plan:  Hold heparin for 1 hour Reduce heparin to 800 units/hr starting at 9/24'@2200'$  Check aPTT, heparin level in 8 hours Daily heparin levels, aPTT, and CBC while on heparin  Antonietta Jewel, PharmD, [place] Clinical Pharmacist  Phone: [phone] 07/19/2021 8:34 PM  Please check AMION for all [place] phone numbers After 10:00 PM, call [place] [phone]

## 2021-07-19 NOTE — Progress Notes (Signed)
Hypoglycemic Event  CBG: 61   Treatment: D50 25 mL (12.5 gm)  Symptoms: None  Follow-up CBG: Time:0724 CBG Result:97  Possible Reasons for Event: Inadequate meal intake  Comments/MD notified:Per hypoglycemia protocol    Analese Sovine Joselita

## 2021-07-20 DIAGNOSIS — Z515 Encounter for palliative care: Secondary | ICD-10-CM | POA: Diagnosis not present

## 2021-07-20 DIAGNOSIS — Z992 Dependence on renal dialysis: Secondary | ICD-10-CM | POA: Diagnosis not present

## 2021-07-20 DIAGNOSIS — N186 End stage renal disease: Secondary | ICD-10-CM | POA: Diagnosis not present

## 2021-07-20 DIAGNOSIS — A419 Sepsis, unspecified organism: Secondary | ICD-10-CM | POA: Diagnosis not present

## 2021-07-20 DIAGNOSIS — Z66 Do not resuscitate: Secondary | ICD-10-CM | POA: Diagnosis not present

## 2021-07-20 DIAGNOSIS — K633 Ulcer of intestine: Secondary | ICD-10-CM | POA: Diagnosis not present

## 2021-07-20 LAB — RENAL FUNCTION PANEL
Albumin: 1.7 g/dL — ABNORMAL LOW (ref 3.5–5.0)
Anion gap: 10 (ref 5–15)
BUN: 43 mg/dL — ABNORMAL HIGH (ref 8–23)
CO2: 28 mmol/L (ref 22–32)
Calcium: 8.5 mg/dL — ABNORMAL LOW (ref 8.9–10.3)
Chloride: 92 mmol/L — ABNORMAL LOW (ref 98–111)
Creatinine, Ser: 3.92 mg/dL — ABNORMAL HIGH (ref 0.44–1.00)
GFR, Estimated: 12 mL/min — ABNORMAL LOW (ref >60)
Glucose, Bld: 170 mg/dL — ABNORMAL HIGH (ref 70–99)
Phosphorus: 4.1 mg/dL (ref 2.5–4.6)
Potassium: 5.9 mmol/L — ABNORMAL HIGH (ref 3.5–5.1)
Sodium: 130 mmol/L — ABNORMAL LOW (ref 135–145)

## 2021-07-20 LAB — BASIC METABOLIC PANEL
Anion gap: 10 (ref 5–15)
BUN: 42 mg/dL — ABNORMAL HIGH (ref 8–23)
CO2: 28 mmol/L (ref 22–32)
Calcium: 8.5 mg/dL — ABNORMAL LOW (ref 8.9–10.3)
Chloride: 93 mmol/L — ABNORMAL LOW (ref 98–111)
Creatinine, Ser: 4.06 mg/dL — ABNORMAL HIGH (ref 0.44–1.00)
GFR, Estimated: 11 mL/min — ABNORMAL LOW (ref >60)
Glucose, Bld: 112 mg/dL — ABNORMAL HIGH (ref 70–99)
Potassium: 5.8 mmol/L — ABNORMAL HIGH (ref 3.5–5.1)
Sodium: 131 mmol/L — ABNORMAL LOW (ref 135–145)

## 2021-07-20 LAB — CBC
HCT: 26.5 % — ABNORMAL LOW (ref 36.0–46.0)
Hemoglobin: 8.1 g/dL — ABNORMAL LOW (ref 12.0–15.0)
MCH: 29.5 pg (ref 26.0–34.0)
MCHC: 30.6 g/dL (ref 30.0–36.0)
MCV: 96.4 fL (ref 80.0–100.0)
Platelets: 306 10*3/uL (ref 150–400)
RBC: 2.75 MIL/uL — ABNORMAL LOW (ref 3.87–5.11)
RDW: 18.5 % — ABNORMAL HIGH (ref 11.5–15.5)
WBC: 12.3 10*3/uL — ABNORMAL HIGH (ref 4.0–10.5)
nRBC: 0.2 % (ref 0.0–0.2)

## 2021-07-20 LAB — APTT: aPTT: 87 seconds — ABNORMAL HIGH (ref 24–36)

## 2021-07-20 LAB — GLUCOSE, CAPILLARY
Glucose-Capillary: 60 mg/dL — ABNORMAL LOW (ref 70–99)
Glucose-Capillary: 118 mg/dL — ABNORMAL HIGH (ref 70–99)
Glucose-Capillary: 108 mg/dL — ABNORMAL HIGH (ref 70–99)
Glucose-Capillary: 100 mg/dL — ABNORMAL HIGH (ref 70–99)
Glucose-Capillary: 87 mg/dL (ref 70–99)

## 2021-07-20 LAB — HEPARIN LEVEL (UNFRACTIONATED): Heparin Unfractionated: 1.02 IU/mL — ABNORMAL HIGH (ref 0.30–0.70)

## 2021-07-20 MED ORDER — DEXTROSE 50 % IV SOLN
INTRAVENOUS | Status: AC
  Filled 2021-07-20: qty 50

## 2021-07-20 MED ORDER — SODIUM ZIRCONIUM CYCLOSILICATE 10 G PO PACK
10.0000 g | PACK | Freq: Once | ORAL | Status: AC
  Administered 2021-07-20: 10 g via ORAL
  Filled 2021-07-20: qty 1

## 2021-07-20 MED ORDER — DEXTROSE 50 % IV SOLN
12.5000 g | INTRAVENOUS | Status: AC
  Administered 2021-07-20: 12.5 g via INTRAVENOUS

## 2021-07-20 NOTE — Progress Notes (Signed)
ANTICOAGULATION CONSULT NOTE Pharmacy Consult for Heparin Indication:  h/o CVA and chronic UE thrombus  Allergies  Allergen Reactions   Sensipar [Cinacalcet]     Per MAR   Statins     Per [facility]    Patient Measurements: Height: '5\' 7"'$  (170.2 cm) Weight: 97.5 kg (215 lb) IBW/kg (Calculated) : 61.6 Heparin Dosing Weight:  85 kg  Vital Signs: Temp: 98 F (36.7 C) (09/25 2042) Temp Source: Oral (09/25 2042) BP: 135/64 (09/25 2042) Pulse Rate: 95 (09/25 2042)  Labs: Recent Labs    07/19/21 0421 07/19/21 0729 07/19/21 1450 07/19/21 1934 07/20/21 0708 07/20/21 1259  HGB 8.0*  --  8.5*  --  8.1*  --   HCT 27.4*  --  28.7*  --  26.5*  --   PLT 248  --  265  --  306  --   APTT >200* >200*  --  162* 87*  --   HEPARINUNFRC >1.10* >1.10*  --  >1.10* 1.02*  --   CREATININE 3.26*  --   --   --  3.92* 4.06*     Estimated Creatinine Clearance: 14.8 mL/min (A) (by C-G formula based on SCr of 4.06 mg/dL (H)).  Assessment: 75 y.o. female with hx CVA and chronic UE thrombus.  Patient with prolonged hospitalization and difficult disposition.  Family has elected to pursue PEG tube placement for ongoing nutritional needs.  Surgery consulted.  Will stop Eliquis and bridge with heparin pending surgical plans. Last dose Eliquis 9/23 at 0900.   aPTT earlier today came back therapeutic at 87, on 800 units/hr. Level scheduled to be collected at 1700 tonight - however, phlebotomy was unable to get level despite 3 attempts. Per policy, cannot re-attempt draw for 24 hours from last attempt (~1800 on 9/26). Will investigate if able to have drawn from HD catheter if plan for HD tomorrow. No s/sx of bleeding per nursing.   Goal of Therapy:  aPTT 66-102 Heparin level 0.3-0.7 units/ml Monitor platelets by anticoagulation protocol: Yes   Plan:  Continue heparin @ 800 units/hr for now F/u if able to get level prior to HD if goes on 9/26 Daily heparin levels, aPTT, and CBC while on heparin  Antonietta Jewel, PharmD, [place] Pharmacist  Phone: [phone] 07/20/2021 10:16 PM  Please check AMION for all [place] phone numbers After 10:00 PM, call [place] [phone]

## 2021-07-20 NOTE — Progress Notes (Signed)
Hypoglycemic Event  CBG: 60  Treatment: D50 25 mL (12.5 gm)  Symptoms: None  Follow-up CBG: Time:0733 CBG Result:118  Possible Reasons for Event: Inadequate meal intake  Comments/MD notified:per hypoglycemia protocol    Amy [name] Joselita

## 2021-07-20 NOTE — Progress Notes (Signed)
[facility] Progress Note   Subjective:    Seen and examined patient at bedside. No changes. Plan for HD 9/26.  Objective Vitals:   07/19/21 1615 07/19/21 2046 07/20/21 0531 07/20/21 0925  BP: 128/69 (!) 141/71 137/75 139/76  Pulse: 89 90 95 98  Resp: '18 19 18 18  '$ Temp: 98.6 F (37 C) 97.6 F (36.4 C) 97.8 F (36.6 C) 98 F (36.7 C)  TempSrc:      SpO2: 97% 98% 99% 99%  Weight:      Height:       Physical Exam General:chronically ill appearing female in NAD Heart:RRR, no mrg Lungs:mostly CTAB anterolaterally  Abdomen:soft, NTND Extremities:1+ edema in hips/thighs and UE Dialysis Access: [facility] in use   Filed Weights   07/17/21 0907 07/17/21 1244 07/17/21 2100  Weight: 82.1 kg 80 kg 97.5 kg    Intake/Output Summary (Last 24 hours) at 07/20/2021 1311 Last data filed at 07/20/2021 1207 Gross per 24 hour  Intake 701.61 ml  Output 0 ml  Net 701.61 ml    Additional Objective Labs: Basic Metabolic Panel: Recent Labs  Lab 07/17/21 0930 07/19/21 0421 07/19/21 2109 07/20/21 0708  NA 128* 133*  --  130*  K 6.3* 6.0* 6.0* 5.9*  CL 90* 96*  --  92*  CO2 30 29  --  28  GLUCOSE 147* 80  --  170*  BUN 39* 36*  --  43*  CREATININE 3.18* 3.26*  --  3.92*  CALCIUM 8.3* 8.5*  --  8.5*  PHOS 2.7 3.0  --  4.1   Liver Function Tests: Recent Labs  Lab 07/17/21 0930 07/19/21 0421 07/20/21 0708  ALBUMIN 1.9* 1.8* 1.7*   No results for input(s): LIPASE, AMYLASE in the last 168 hours. CBC: Recent Labs  Lab 07/15/21 0812 07/17/21 0930 07/19/21 0421 07/19/21 1450 07/20/21 0708  WBC 11.0* 10.2 14.4* 14.7* 12.3*  HGB 8.3* 9.1* 8.0* 8.5* 8.1*  HCT 27.9* 29.1* 27.4* 28.7* 26.5*  MCV 100.0 98.0 98.2 98.3 96.4  PLT 310 306 248 265 306   Blood Culture    Component Value Date/Time   SDES BLOOD RIGHT ANTECUBITAL 06/26/2021 1234   SPECREQUEST  06/26/2021 1234    BOTTLES DRAWN AEROBIC AND ANAEROBIC Blood Culture adequate volume   CULT  06/26/2021 1234     NO GROWTH 5 DAYS Performed at [facility], [place] [address] [place], [place] [address]    REPTSTATUS 07/01/2021 FINAL 06/26/2021 1234    Cardiac Enzymes: No results for input(s): CKTOTAL, CKMB, CKMBINDEX, TROPONINI in the last 168 hours. CBG: Recent Labs  Lab 07/19/21 2048 07/19/21 2143 07/20/21 0646 07/20/21 0733 07/20/21 1134  GLUCAP 54* 126* 60* 118* 108*   Iron Studies: No results for input(s): IRON, TIBC, TRANSFERRIN, FERRITIN in the last 72 hours. Lab Results  Component Value Date   INR 0.9 03/10/2021   INR 1.0 01/26/2021   Studies/Results: No results found.  Medications:  sodium chloride     sodium chloride     dextrose 30 mL/hr at 07/19/21 1149   feeding supplement (OSMOLITE 1.5 CAL)     heparin 800 Units/hr (07/19/21 2231)   iron sucrose Stopped (07/17/21 1500)    atorvastatin  40 mg Per Tube Daily   chlorhexidine  15 mL Mouth Rinse BID   Chlorhexidine Gluconate Cloth  6 each Topical Daily   darbepoetin (ARANESP) injection - DIALYSIS  200 mcg Intravenous Q Thu-HD   feeding supplement  237 mL Oral BID BM  feeding supplement (PROSource TF)  45 mL Per Tube Daily   gabapentin  100 mg Per Tube Q12H   insulin aspart  0-6 Units Subcutaneous TID WC   lactulose  20 g Per Tube Daily   mouth rinse  15 mL Mouth Rinse q12n4p   midodrine  10 mg Per Tube TID WC   mirtazapine  7.5 mg Per Tube QHS   multivitamin  1 tablet Per Tube QHS   OLANZapine  2.5 mg Per Tube QHS   pantoprazole sodium  40 mg Per Tube Daily   sodium chloride flush  10-40 mL Intracatheter Q12H   thiamine  100 mg Per Tube Daily   venlafaxine  37.5 mg Per Tube BID    Dialysis Orders: AF MWF  3h 37mn 400/500    62.5kg    3K/2.5 Ca    P2  AVG HeRO   Hep none - Mircera 50 q 2 wks  (7/13)  Venofer 50 q wk - Parsabiv '5mg'$  IV q HD  Assessment/Plan: AMS/FTT: Mental status waxes/wanes. Also uses restraints on/off which has caused her to be in bed occasionally for HD. Pt will have to be able to  sit in a chair for 4-6 hrs without restraints to qualify for OP HD w/ our group.  If she continues to tolerate sitting in the chair for dialysis and cooperate, then will re-CLIP the patient as her OP HD unit will not take her back. HD Nurse Coordinator and Renal Navigator following, stated prior to re-clipping her, we need to ensure patient nutritional status is stable. Core Track tube feeding has been pulled and patient is able to take medication by mouth. Spoke to bedside RN today-patient is still not eating her meals. Melena/Rectal bleeding - GI consulted. Hx rectal ulcers.  EGD 7/27 normal, flex sig with mucosal ulceration c/w sterocoral ulcers, non bleeding. Hgb 8s, stable. ESRD: on HD-was on TTS schedule but now will be switched back to MWF (no HD 9/24 d/t HD census and low staffing). Noted K+ 9/24 was 6.0-Kayexalate enema 60GM X 1 given. Re-check AM was 6.0-10gm Lokelma given (ordered by primary). Re-check now 5.9. Plan for HD 9/26. Dysphagia - Patient refusing G-tube placement.  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 AVG clotted 9/2, unable to be declotted d/t arm contracture. S/P conversion to tunneled HD catheter 9/7 via IR-on Heparin drip, appreciate their assistance.  Hypertension/volume: BP stable, on midodrine '10mg'$  TID with significant overload on exam.  Continue to maximize UF as tolerated, can give albumin with HD for BP support.   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 CoreTrac removed-patient taking medications by mouth-continue assisting and encouraging patient with meals. 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 9. Psych eval from 9/14 determined that patient has functional mental capacity for medical decision-making. However, on 07/18/21, the hospitalist team and myself were at patient's bedside. She appears more confused-it was determined that she is unable to make medical decisions. Psych re-consulted to evaluate the  patient again on 9/23-at this point, patient does not have the capacity to make medical decisions. Ongoing discussions with patient's son continues. 10. Anemia: Continue Aranesp-dose increase to 200 mcg q week on 9/15.  Last Hgb 8.1.   11. Dispo: DNR/DNI. Poor prognosis. Palliative care has seen patient. Appreciate their involvement. Although patient is currently tolerating being in the chair for HD, we do not believe dialysis is adding to her quality of life. Currently, patient no longer has  capacity to make medical decisions-please see #9. Ongoing discussions with multi-discipline team and patient's son. Plan for primary team to re-consult Surgery for Gtube placement per Son's request. Reviewed last surgery note-plan to re-consult on 9/26.  Tobie Poet, NP [facility] 07/20/2021,1:11 PM  LOS: 61 days

## 2021-07-20 NOTE — Progress Notes (Signed)
Received a call from  [place] at the pharmacy regarding the pt PTT lab draw. The pharmacy requested that pt PTT to be drawn in hemodialysis tomorrow. MD notified via secure chat.

## 2021-07-20 NOTE — Progress Notes (Signed)
Received call from Destiny from lab,according to her,three lab tech stuck patient three times and was unsuccessful. According to her,their policy is if they stuck patient 3x,they can't stick patient for the next 24 hours. Pharmacy made aware,MD,[place]b notified via secure chat.

## 2021-07-20 NOTE — Progress Notes (Addendum)
HOSPITAL MEDICINE OVERNIGHT EVENT NOTE    Notified by nursing that potassium 6.0.  Chart reviewed, patient has had persisting hyperkalemia since at least 9/20.  Daytime provider's note states that nephrology is aware and is actively following the patient.  Patient is actively receiving hemodialysis and is to go for another session later today.  We will go ahead and administer a dose of 10 g of Lokelma.  Repeat ECG obtained without significant change. Monitoring patient on telemetry.  Vernelle Emerald  MD [facility]

## 2021-07-20 NOTE — Progress Notes (Signed)
ANTICOAGULATION CONSULT NOTE Pharmacy Consult for Heparin Indication:  h/o CVA and chronic UE thrombus  Allergies  Allergen Reactions   Sensipar [Cinacalcet]     Per MAR   Statins     Per [facility]    Patient Measurements: Height: '5\' 7"'$  (170.2 cm) Weight: 97.5 kg (215 lb) IBW/kg (Calculated) : 61.6 Heparin Dosing Weight:  85 kg  Vital Signs: Temp: 98 F (36.7 C) (09/25 0925) BP: 139/76 (09/25 0925) Pulse Rate: 98 (09/25 0925)  Labs: Recent Labs    07/17/21 0930 07/17/21 0930 07/19/21 0421 07/19/21 0729 07/19/21 1450 07/19/21 1934 07/20/21 0708  HGB 9.1*  --  8.0*  --  8.5*  --  8.1*  HCT 29.1*  --  27.4*  --  28.7*  --  26.5*  PLT 306  --  248  --  265  --  306  APTT  --    < > >200* >200*  --  162* 87*  HEPARINUNFRC  --    < > >1.10* >1.10*  --  >1.10* 1.02*  CREATININE 3.18*  --  3.26*  --   --   --  3.92*   < > = values in this interval not displayed.     Estimated Creatinine Clearance: 15.3 mL/min (A) (by C-G formula based on SCr of 3.92 mg/dL (H)).  Assessment: 75 y.o. female with hx CVA and chronic UE thrombus.  Patient with prolonged hospitalization and difficult disposition.  Family has elected to pursue PEG tube placement for ongoing nutritional needs.  Surgery consulted.  Will stop Eliquis and bridge with heparin pending surgical plans. Last dose Eliquis 9/23 at 0900.   aPTT returned at 162 9/24 PM on 1,000 units/h. Dose was held for 1h and heparin restarted at 800 unit/h.  aPTT 9/25 AM 87 with heparin running at 800 units/h, heparin level 1.02 and still not correlating, so will titrate heparin based off of aPTT. H/H stable, PLT WNL. Per RN, no signs of bleeding, heparin level/aPTT drawn from right hand on same side as heparin (AV fistula on left side).   Goal of Therapy:  aPTT 66-102 Heparin level 0.3-0.7 units/ml Monitor platelets by anticoagulation protocol: Yes   Plan:  CONTINUE heparin @ 800 units/h  Check aPTT in 8 hours  Daily heparin levels,  aPTT, and CBC while on heparin  Adria Dill, PharmD PGY-1 Acute Care Resident  07/20/2021 9:27 AM

## 2021-07-20 NOTE — Progress Notes (Signed)
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  She had altered mental status seen by neurology MRI similar to prior MRI at [facility] with a large right MCA stroke, seen by nephrology for dialysis need.  Patient having ongoing confusion weakness inability to sit in dialysis recliner and so not accepted back to outpatient dialysis at this time and awaiting further improvement.  She has had poor intake failure to thrive family wanted PEG tube I had tried but was unable to place because of overlying transverse colon and has been on core track NG tube feeding.  Seen by surgery for G-tube placement.  Core track was clogged and changed 07/07/21. 9/13-psych consulted for competency and patient does have functional mental capacity for medical decision and has refused G tube. Extensive assessment done at bedside 9/23 with psychiatry primary team and nephrology, and felt that patient on ultimate medical decision  Subjective: Seen and examined this morning.  Patient is alert awake pleasantly confused  Lokelma last night k down to 5.9  Assessment & Plan:  Acute on chronic lower GI bleeding from stercoral ulcer S/P EGD and flex sigmoidoscopy.HH stable  Anemia of chronic renal disease: Overall hemoglobin is stable.  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  Able to answer some questions but appears confused encephalopathic.  Currently on heparin gtt, cont Lipitor and supportive care.  Continue Zyprexa per psychiatry.    Hypoglycemia-after tube feed has been stopped.  Started on D10  9/24- increase rate- monitor CBG Recent Labs  Lab 07/19/21 1614 07/19/21 2048 07/19/21 2143 07/20/21 0646 07/20/21 0733  GLUCAP 73 54* 126* 60* 118*    Leukocytosis but afebrile. Downtredning. Monitor Recent Labs  Lab 07/15/21 0812 07/17/21 0930 07/19/21 0421 07/19/21 1450 07/20/21 0708  WBC 11.0* 10.2 14.4* 14.7* 12.3*     Failure to thrive/generalized deconditioning, poor oral intake  Severe protein calorie malnutrition Dysphagia ruled out speech eval 8/25 Patient adamant on not eating hospital food and states "I will eat food from home " but she has not eaten in several weeks.  Unable to replace NGT 9/23, will need IR consult on Monday until G tube. Surgery has been consulted for G-tube placement as wished by patient's POA son-was making the decision at this time as patient is deemed unable to make medical decision.  ESRD on HD MWF .now on TTS schedule, I have discussed with the   nephrologist who is actively following with the nephrology.    Hyperkalemia -was 6.0, I notified Dr Jonnie Finner 9/24- s/p Lokelma 10 gm last night k down to 5.9- cotn HF per nephro. Recent Labs  Lab 07/15/21 [id number] 07/17/21 0930 07/19/21 0421 07/19/21 2109 07/20/21 0708  K  6.5* 6.3* 6.0* 6.0* 5.9*   Hyponatremia per HD  Constipation-resolved  Hypotension continue midodrine.  Monitor blood pressure  Type 2 diabetes mellitus with episodes of hypoglycemia A1c 5.1, blood sugar low starting D10 not Recent Labs  Lab 07/19/21 1614 07/19/21 2048 07/19/21 2143  07/20/21 0646 07/20/21 0733  GLUCAP 73 54* 126* 60* 118*     Chronic thrombus left upper extremity transitioned to heparin holding Eliquis in case for G-tube placement GERD -cont ppi Stage II mid coccyx pressure injury GOC: currently DNR.  Followed by palliative care.  Overall prognosis guarded/poor, son has been wanting ongoing dialysis and feeding tube placement for nutrition.  He is aware about risk of decompensation, high risk of readmission  DTP-TOC following  Diet Order             Diet regular Room service appropriate? Yes; Fluid consistency: Thin  Diet effective now                   Nutrition Problem: Severe Malnutrition Etiology: chronic illness (ESRD on HD, prior CVA) Signs/Symptoms: percent weight loss, severe muscle depletion, energy intake < or equal to 75% for > or equal to 1 month Percent weight loss: 16.4 % Interventions: Nepro shake, Magic cup, MVI Patient's Body mass index is 33.67 kg/m.  DVT prophylaxis: Place TED hose Start: 05/19/21 1706 Code Status:   Code Status: DNR  Family Communication: plan of care discussed during the multidisciplinary meeting,   Remains inpatient appropriate because:Inpatient level of care appropriate due to severity of illness  Dispo: The patient is from: Home              Anticipated d/c is to: SNF              Patient currently is not medically stable   Difficult to place patient Yes Unresulted Labs (From admission, onward)     Start     Ordered   07/21/21 0500  APTT  Daily,   R     Question:  Specimen collection method  Answer:  Lab=Lab collect   07/19/21 2039   07/21/21 0500  Heparin level (unfractionated)  Daily,   R     Question:  Specimen collection method  Answer:  Lab=Lab collect   07/19/21 2039   07/20/21 [id number]  Basic metabolic panel  Once-Timed,   TIMED       Question:  Specimen collection method  Answer:  Lab=Lab collect   07/20/21 0807   07/20/21 0500  CBC  Daily,   R     Question:  Specimen collection  method  Answer:  Lab=Lab collect   07/18/21 1301            Medications reviewed:  Scheduled Meds:  atorvastatin  40 mg Per Tube Daily   chlorhexidine  15 mL Mouth Rinse BID   Chlorhexidine Gluconate Cloth  6 each Topical Daily   darbepoetin (ARANESP) injection - DIALYSIS  200 mcg Intravenous Q Thu-HD   feeding supplement  237 mL Oral BID BM   feeding supplement (PROSource TF)  45 mL Per Tube Daily   gabapentin  100 mg Per Tube Q12H   insulin aspart  0-6 Units Subcutaneous TID WC   lactulose  20 g Per Tube Daily   mouth rinse  15 mL Mouth Rinse q12n4p   midodrine  10 mg Per Tube TID WC   mirtazapine  7.5 mg Per Tube QHS   multivitamin  1 tablet Per Tube QHS  OLANZapine  2.5 mg Per Tube QHS   pantoprazole sodium  40 mg Per Tube Daily   sodium chloride flush  10-40 mL Intracatheter Q12H   thiamine  100 mg Per Tube Daily   venlafaxine  37.5 mg Per Tube BID   Continuous Infusions:  sodium chloride     sodium chloride     dextrose 30 mL/hr at 07/19/21 1149   feeding supplement (OSMOLITE 1.5 CAL)     heparin 800 Units/hr (07/19/21 2231)   iron sucrose Stopped (07/17/21 1500)   Consultants:see note  Procedures:see note Antimicrobials: Anti-infectives (From admission, onward)    Start     Dose/Rate Route Frequency Ordered Stop   07/02/21 1116  ceFAZolin (ANCEF) IVPB 2g/100 mL premix        over 30 Minutes Intravenous Continuous PRN 07/02/21 1139 07/02/21 1116   07/02/21 1111  ceFAZolin (ANCEF) 2-4 GM/100ML-% IVPB       Note to Pharmacy: Domenick Bookbinder   : cabinet override      07/02/21 1111 07/02/21 2314   07/01/21 1215  ceFAZolin (ANCEF) IVPB 2g/100 mL premix        2 g 200 mL/hr over 30 Minutes Intravenous On call 07/01/21 0902 07/02/21 1215   05/29/21 1622  ceFAZolin (ANCEF) 2-4 GM/100ML-% IVPB       Note to Pharmacy: Lytle Butte   : cabinet override      05/29/21 1622 05/30/21 0429   05/29/21 0000  cefTRIAXone (ROCEPHIN) 1 g in sodium chloride 0.9 % 100 mL IVPB   Status:  Discontinued        1 g 200 mL/hr over 30 Minutes Intravenous Every 24 hours 05/28/21 1801 05/28/21 1801   05/28/21 1830  cefTRIAXone (ROCEPHIN) 1 g in sodium chloride 0.9 % 100 mL IVPB        1 g 200 mL/hr over 30 Minutes Intravenous Every 24 hours 05/28/21 1801 05/30/21 1907   05/28/21 1630  ceFAZolin (ANCEF) IVPB 2g/100 mL premix        2 g 200 mL/hr over 30 Minutes Intravenous To Radiology 05/28/21 1537 05/29/21 1630      Culture/Microbiology    Component Value Date/Time   SDES BLOOD RIGHT ANTECUBITAL 06/26/2021 1234   SPECREQUEST  06/26/2021 1234    BOTTLES DRAWN AEROBIC AND ANAEROBIC Blood Culture adequate volume   CULT  06/26/2021 1234    NO GROWTH 5 DAYS Performed at [facility], [place] [address], [place],  [address]    REPTSTATUS 07/01/2021 FINAL 06/26/2021 1234    Other culture-see note  Objective: Vitals: Today's Vitals   07/19/21 1615 07/19/21 2046 07/20/21 0400 07/20/21 0531  BP: 128/69 (!) 141/71  137/75  Pulse: 89 90  95  Resp: '18 19  18  '$ Temp: 98.6 F (37 C) 97.6 F (36.4 C)  97.8 F (36.6 C)  TempSrc:      SpO2: 97% 98%  99%  Weight:      Height:      PainSc:   Asleep     Intake/Output Summary (Last 24 hours) at 07/20/2021 0855 Last data filed at 07/20/2021 0700 Gross per 24 hour  Intake 701.61 ml  Output 0 ml  Net 701.61 ml    Filed Weights   07/17/21 0907 07/17/21 1244 07/17/21 2100  Weight: 82.1 kg 80 kg 97.5 kg   Weight change:   Intake/Output from previous day: 09/24 0701 - 09/25 0700 In: 701.6 [P.O.:60; I.V.:641.6] Out: 0  Intake/Output this shift: No intake/output data  recorded. Filed Weights   07/17/21 0907 07/17/21 1244 07/17/21 2100  Weight: 82.1 kg 80 kg 97.5 kg   Examination: General exam: AAOx2, older than stated age, weak appearing. HEENT:Oral mucosa moist, Ear/Nose WNL grossly, dentition normal. Respiratory system: bilaterally cear, no use of accessory muscle Cardiovascular system: S1 & S2 +, No  JVD,. Gastrointestinal system: Abdomen soft, NT,ND, BS+ Nervous System:Alert, awake, weakness on left side with spastic hemiplegia on left  Extremities: Edematous upper extremity Skin: No rashes,no icterus. MSK: Normal muscle bulk,tone, power  HD catheter in place in chest  Data Reviewed: I have personally reviewed following labs and imaging studies CBC: Recent Labs  Lab 07/15/21 0812 07/17/21 0930 07/19/21 0421 07/19/21 1450 07/20/21 0708  WBC 11.0* 10.2 14.4* 14.7* 12.3*  HGB 8.3* 9.1* 8.0* 8.5* 8.1*  HCT 27.9* 29.1* 27.4* 28.7* 26.5*  MCV 100.0 98.0 98.2 98.3 96.4  PLT 310 306 248 265 [id number]   Basic Metabolic Panel: Recent Labs  Lab 07/15/21 0812 07/17/21 0930 07/19/21 0421 07/19/21 2109 07/20/21 0708  NA 130* 128* 133*  --  130*  K 6.5* 6.3* 6.0* 6.0* 5.9*  CL 93* 90* 96*  --  92*  CO2 '29 30 29  '$ --  28  GLUCOSE 137* 147* 80  --  170*  BUN 39* 39* 36*  --  43*  CREATININE 3.51* 3.18* 3.26*  --  3.92*  CALCIUM 8.4* 8.3* 8.5*  --  8.5*  PHOS 2.4* 2.7 3.0  --  4.1    GFR: Estimated Creatinine Clearance: 15.3 mL/min (A) (by C-G formula based on SCr of 3.92 mg/dL (H)). Liver Function Tests: Recent Labs  Lab 07/15/21 0812 07/17/21 0930 07/19/21 0421 07/20/21 0708  ALBUMIN 1.8* 1.9* 1.8* 1.7*    No results for input(s): LIPASE, AMYLASE in the last 168 hours. No results for input(s): AMMONIA in the last 168 hours. Coagulation Profile: No results for input(s): INR, PROTIME in the last 168 hours. Cardiac Enzymes: No results for input(s): CKTOTAL, CKMB, CKMBINDEX, TROPONINI in the last 168 hours. BNP (last 3 results) No results for input(s): PROBNP in the last 8760 hours. HbA1C: No results for input(s): HGBA1C in the last 72 hours. CBG: Recent Labs  Lab 07/19/21 1614 07/19/21 2048 07/19/21 2143 07/20/21 0646 07/20/21 0733  GLUCAP 73 54* 126* 60* 118*    Lipid Profile: No results for input(s): CHOL, HDL, LDLCALC, TRIG, CHOLHDL, LDLDIRECT in the last 72  hours. Thyroid Function Tests: No results for input(s): TSH, T4TOTAL, FREET4, T3FREE, THYROIDAB in the last 72 hours. Anemia Panel: No results for input(s): VITAMINB12, FOLATE, FERRITIN, TIBC, IRON, RETICCTPCT in the last 72 hours. Sepsis Labs: No results for input(s): PROCALCITON, LATICACIDVEN in the last 168 hours.  Recent Results (from the past 240 hour(s))  Resp Panel by RT-PCR (Flu A&B, Covid) Nasopharyngeal Swab     Status: None   Collection Time: 07/18/21 10:11 AM   Specimen: Nasopharyngeal Swab; Nasopharyngeal(NP) swabs in vial transport medium  Result Value Ref Range Status   SARS Coronavirus 2 by RT PCR NEGATIVE NEGATIVE Final    Comment: (NOTE) SARS-CoV-2 target nucleic acids are NOT DETECTED.  The SARS-CoV-2 RNA is generally detectable in upper respiratory specimens during the acute phase of infection. The lowest concentration of SARS-CoV-2 viral copies this assay can detect is 138 copies/mL. A negative result does not preclude SARS-Cov-2 infection and should not be used as the sole basis for treatment or other patient management decisions. A negative result may occur with  improper specimen collection/handling,  submission of specimen other than nasopharyngeal swab, presence of viral mutation(s) within the areas targeted by this assay, and inadequate number of viral copies(<138 copies/mL). A negative result must be combined with clinical observations, patient history, and epidemiological information. The expected result is Negative.  Fact Sheet for Patients:  EntrepreneurPulse.com.au  Fact Sheet for Healthcare Providers:  IncredibleEmployment.be  This test is no t yet approved or cleared by the [place] FDA and  has been authorized for detection and/or diagnosis of SARS-CoV-2 by FDA under an Emergency Use Authorization (EUA). This EUA will remain  in effect (meaning this test can be used) for the duration of the COVID-19  declaration under Section 564(b)(1) of the Act, 21 U.S.C.section 360bbb-3(b)(1), unless the authorization is terminated  or revoked sooner.       Influenza A by PCR NEGATIVE NEGATIVE Final   Influenza B by PCR NEGATIVE NEGATIVE Final    Comment: (NOTE) The Xpert Xpress SARS-CoV-2/FLU/RSV plus assay is intended as an aid in the diagnosis of influenza from Nasopharyngeal swab specimens and should not be used as a sole basis for treatment. Nasal washings and aspirates are unacceptable for Xpert Xpress SARS-CoV-2/FLU/RSV testing.  Fact Sheet for Patients: EntrepreneurPulse.com.au  Fact Sheet for Healthcare Providers: IncredibleEmployment.be  This test is not yet approved or cleared by the [place] FDA and has been authorized for detection and/or diagnosis of SARS-CoV-2 by FDA under an Emergency Use Authorization (EUA). This EUA will remain in effect (meaning this test can be used) for the duration of the COVID-19 declaration under Section 564(b)(1) of the Act, 21 U.S.C. section 360bbb-3(b)(1), unless the authorization is terminated or revoked.  Performed at [facility], [place] [address], [place], [place] [address]        Radiology Studies: No results found.   LOS: 61 days   Antonieta Pert, MD [facility]  07/20/2021, 8:55 AM

## 2021-07-21 DIAGNOSIS — R627 Adult failure to thrive: Secondary | ICD-10-CM | POA: Diagnosis not present

## 2021-07-21 DIAGNOSIS — E43 Unspecified severe protein-calorie malnutrition: Secondary | ICD-10-CM | POA: Diagnosis not present

## 2021-07-21 DIAGNOSIS — I6330 Cerebral infarction due to thrombosis of unspecified cerebral artery: Secondary | ICD-10-CM | POA: Diagnosis not present

## 2021-07-21 DIAGNOSIS — N186 End stage renal disease: Secondary | ICD-10-CM | POA: Diagnosis not present

## 2021-07-21 LAB — CBC
HCT: 27.2 % — ABNORMAL LOW (ref 36.0–46.0)
Hemoglobin: 8.3 g/dL — ABNORMAL LOW (ref 12.0–15.0)
MCH: 28.9 pg (ref 26.0–34.0)
MCHC: 30.5 g/dL (ref 30.0–36.0)
MCV: 94.8 fL (ref 80.0–100.0)
Platelets: 363 10*3/uL (ref 150–400)
RBC: 2.87 MIL/uL — ABNORMAL LOW (ref 3.87–5.11)
RDW: 18 % — ABNORMAL HIGH (ref 11.5–15.5)
WBC: 9.4 10*3/uL (ref 4.0–10.5)
nRBC: 0 % (ref 0.0–0.2)

## 2021-07-21 LAB — GLUCOSE, CAPILLARY
Glucose-Capillary: 148 mg/dL — ABNORMAL HIGH (ref 70–99)
Glucose-Capillary: 65 mg/dL — ABNORMAL LOW (ref 70–99)
Glucose-Capillary: 101 mg/dL — ABNORMAL HIGH (ref 70–99)
Glucose-Capillary: 85 mg/dL (ref 70–99)
Glucose-Capillary: 77 mg/dL (ref 70–99)

## 2021-07-21 LAB — APTT
aPTT: >200 seconds (ref 24–36)
aPTT: 50 seconds — ABNORMAL HIGH (ref 24–36)

## 2021-07-21 LAB — HEPARIN LEVEL (UNFRACTIONATED)
Heparin Unfractionated: 0.66 IU/mL (ref 0.30–0.70)
Heparin Unfractionated: 0.36 IU/mL (ref 0.30–0.70)

## 2021-07-21 MED ORDER — PROSOURCE PLUS PO LIQD
30.0000 mL | Freq: Two times a day (BID) | ORAL | Status: DC
  Administered 2021-07-21 (×2): 30 mL via ORAL
  Filled 2021-07-21 (×7): qty 30

## 2021-07-21 MED ORDER — OLANZAPINE 5 MG PO TBDP
2.5000 mg | ORAL_TABLET | Freq: Every day | ORAL | Status: DC
  Administered 2021-07-21 – 2021-09-09 (×40): 2.5 mg via ORAL
  Filled 2021-07-21 (×52): qty 0.5

## 2021-07-21 MED ORDER — LACTULOSE 10 GM/15ML PO SOLN
20.0000 g | Freq: Every day | ORAL | Status: DC
  Administered 2021-07-21 – 2021-08-07 (×12): 20 g via ORAL
  Filled 2021-07-21 (×17): qty 30

## 2021-07-21 MED ORDER — ATORVASTATIN CALCIUM 40 MG PO TABS
40.0000 mg | ORAL_TABLET | Freq: Every day | ORAL | Status: DC
  Filled 2021-07-21: qty 1

## 2021-07-21 MED ORDER — ACETAMINOPHEN 325 MG PO TABS
650.0000 mg | ORAL_TABLET | Freq: Four times a day (QID) | ORAL | Status: DC | PRN
  Administered 2021-07-25 – 2021-09-09 (×11): 650 mg via ORAL
  Filled 2021-07-21 (×10): qty 2

## 2021-07-21 MED ORDER — ALTEPLASE 2 MG IJ SOLR
INTRAMUSCULAR | Status: AC
  Administered 2021-07-21: 2 mg
  Filled 2021-07-21: qty 4

## 2021-07-21 MED ORDER — ATORVASTATIN CALCIUM 40 MG PO TABS
40.0000 mg | ORAL_TABLET | Freq: Every day | ORAL | Status: DC
  Administered 2021-07-21 – 2021-08-07 (×14): 40 mg via ORAL
  Filled 2021-07-21 (×15): qty 1

## 2021-07-21 MED ORDER — DEXTROSE 50 % IV SOLN
INTRAVENOUS | Status: AC
  Administered 2021-07-21: 25 mL
  Filled 2021-07-21: qty 50

## 2021-07-21 MED ORDER — PANTOPRAZOLE 2 MG/ML SUSPENSION
40.0000 mg | Freq: Every day | ORAL | Status: DC
  Administered 2021-07-21 – 2021-07-31 (×9): 40 mg via ORAL
  Filled 2021-07-21 (×10): qty 20

## 2021-07-21 MED ORDER — GABAPENTIN 250 MG/5ML PO SOLN
100.0000 mg | Freq: Two times a day (BID) | ORAL | Status: DC
  Administered 2021-07-21 – 2021-09-09 (×77): 100 mg via ORAL
  Filled 2021-07-21 (×108): qty 2

## 2021-07-21 MED ORDER — MIRTAZAPINE 15 MG PO TABS
7.5000 mg | ORAL_TABLET | Freq: Every day | ORAL | Status: DC
  Administered 2021-07-21 – 2021-09-09 (×42): 7.5 mg via ORAL
  Filled 2021-07-21 (×45): qty 1

## 2021-07-21 MED ORDER — VENLAFAXINE HCL 37.5 MG PO TABS
37.5000 mg | ORAL_TABLET | Freq: Two times a day (BID) | ORAL | Status: DC
  Administered 2021-07-21 – 2021-09-08 (×56): 37.5 mg via ORAL
  Filled 2021-07-21 (×101): qty 1

## 2021-07-21 MED ORDER — THIAMINE HCL 100 MG PO TABS
100.0000 mg | ORAL_TABLET | Freq: Every day | ORAL | Status: DC
  Administered 2021-07-21 – 2021-08-07 (×14): 100 mg via ORAL
  Filled 2021-07-21 (×15): qty 1

## 2021-07-21 MED ORDER — DEXTROSE 50 % IV SOLN: 12.5000 g | INTRAVENOUS | Status: AC

## 2021-07-21 MED ORDER — GABAPENTIN 250 MG/5ML PO SOLN: 100.0000 mg | Freq: Two times a day (BID) | ORAL | Status: DC

## 2021-07-21 MED ORDER — THIAMINE HCL 100 MG PO TABS
100.0000 mg | ORAL_TABLET | Freq: Every day | ORAL | Status: DC
  Filled 2021-07-21: qty 1

## 2021-07-21 MED ORDER — ADULT MULTIVITAMIN LIQUID CH
15.0000 mL | Freq: Every day | ORAL | Status: DC
  Filled 2021-07-21: qty 15

## 2021-07-21 MED ORDER — ACETAMINOPHEN 325 MG PO TABS: 650.0000 mg | ORAL_TABLET | Freq: Four times a day (QID) | ORAL | Status: DC | PRN

## 2021-07-21 MED ORDER — MIDODRINE HCL 5 MG PO TABS
10.0000 mg | ORAL_TABLET | Freq: Three times a day (TID) | ORAL | Status: DC
  Administered 2021-07-21 – 2021-08-07 (×39): 10 mg via ORAL
  Filled 2021-07-21 (×43): qty 2

## 2021-07-21 MED ORDER — PANTOPRAZOLE 2 MG/ML SUSPENSION
40.0000 mg | Freq: Every day | ORAL | Status: DC
  Filled 2021-07-21: qty 20

## 2021-07-21 MED ORDER — SODIUM ZIRCONIUM CYCLOSILICATE 10 G PO PACK
10.0000 g | PACK | Freq: Two times a day (BID) | ORAL | Status: AC
  Administered 2021-07-21 (×2): 10 g via ORAL
  Filled 2021-07-21 (×2): qty 1

## 2021-07-21 MED ORDER — ACETAMINOPHEN 160 MG/5ML PO SOLN
650.0000 mg | Freq: Four times a day (QID) | ORAL | Status: DC | PRN
  Administered 2021-08-24: 650 mg via ORAL
  Filled 2021-07-21 (×3): qty 20.3

## 2021-07-21 MED ORDER — RENA-VITE PO TABS
1.0000 | ORAL_TABLET | Freq: Every day | ORAL | Status: DC
  Administered 2021-07-21 – 2021-08-06 (×16): 1 via ORAL
  Filled 2021-07-21 (×16): qty 1

## 2021-07-21 NOTE — Progress Notes (Signed)
Hypoglycemic Event  CBG: 65  Treatment: D50 25 mL (12.5 gm)  Symptoms: None  Follow-up CBG: Time:0705 CBG Result:145  Possible Reasons for Event: Inadequate meal intake  Comments/MD notified:Per hypoglycemia protocol    Amy [name]

## 2021-07-21 NOTE — Progress Notes (Signed)
ANTICOAGULATION CONSULT NOTE Pharmacy Consult for Heparin Indication:  h/o CVA and chronic UE thrombus  Allergies  Allergen Reactions   Sensipar [Cinacalcet]     Per MAR   Statins     Per Dr [facility]    Patient Measurements: Height: '5\' 7"'$  (170.2 cm) Weight:  (In chair) IBW/kg (Calculated) : 61.6 Heparin Dosing Weight:  85 kg  Vital Signs: Temp: 98 F (36.7 C) (09/26 0942) Temp Source: Oral (09/26 0942) BP: 155/78 (09/26 0942) Pulse Rate: 88 (09/26 0942)  Labs: Recent Labs    07/19/21 0421 07/19/21 0729 07/19/21 1450 07/19/21 1934 07/20/21 0708 07/20/21 1259 07/21/21 0823  HGB 8.0*  --  8.5*  --  8.1*  --  8.3*  HCT 27.4*  --  28.7*  --  26.5*  --  27.2*  PLT 248  --  265  --  306  --  363  APTT >200*   < >  --  162* 87*  --  >200*  HEPARINUNFRC >1.10*   < >  --  >1.10* 1.02*  --  0.66  CREATININE 3.26*  --   --   --  3.92* 4.06*  --    < > = values in this interval not displayed.     Estimated Creatinine Clearance: 14.8 mL/min (A) (by C-G formula based on SCr of 4.06 mg/dL (H)).  Assessment: 75 y.o. female with hx CVA and chronic UE thrombus.  Patient with prolonged hospitalization and difficult disposition.  Family has elected to pursue PEG tube placement for ongoing nutritional needs.  Surgery consulted.  Will stop Eliquis and bridge with heparin pending surgical plans. Last dose Eliquis 9/23 at 0900.   aPTT earlier  9/25y came back therapeutic at 87, on 800 units/hr. Level scheduled to be collected at 1700 last night - however, phlebotomy was unable to get level despite 3 attempts. Per policy, cannot re-attempt draw for 24 hours from last attempt (~1800 on 9/26).  aPTT and HL was drawn in HD this AM 9/26.  aPTT >200, HL =0.66  on heparin 800 units/hr.  These two results do not correlate/ a bit confusing.  HL is therapeutic and has come down from previous days as expected after off apixaban since 9/23 AM.  PTT high possibly due to drawn in HD. I will decrease heparin  rate in response to PTT >200, HL 0.66 and recheck labs in ~ 6-8 hours.  No bleelding noted per RN.   CBC stable.    Goal of Therapy:  aPTT 66-102 Heparin level 0.3-0.7 units/ml Monitor platelets by anticoagulation protocol: Yes   Plan:  Decrease heparin drip to 700 units/hr Check 6 hour heparin level and aPTT Daily heparin levels, aPTT, and CBC while on heparin  Nicole Cella, RPh Clinical Pharmacist  Phone: [phone] 07/21/2021 11:19 AM  Please check AMION for all [place] phone numbers After 10:00 PM, call [place] [phone]

## 2021-07-21 NOTE — Progress Notes (Signed)
CRITICAL VALUE ALERT  Critical Value:  PTT  greater than 200   Date & Time Notied:  07/24/2021 '@11'$ :03   Provider  Notified: MD Gibson   Orders Received/Actions taken: Pharmacy notify making changes to heparin level.

## 2021-07-21 NOTE — Procedures (Signed)
Patient seen on Hemodialysis-treatment being terminated because of catheter dysfunction with the plan to lock ports with alteplase and bring her back to hemodialysis again tomorrow.  She is in a recliner and is comfortable. BP (!) 162/83   Pulse 96   Temp 98.8 F (37.1 C) (Oral)   Resp 15   Ht '5\' 7"'$  (1.702 m)   Wt 97.5 kg   LMP  (LMP Unknown)   SpO2 100%   BMI 33.67 kg/m   QB 200, UF goal 3 L To reattempt hemodialysis again tomorrow after overnight catheter dwell with tPA.   Elmarie Shiley MD [facility]. Office # [phone] Pager # [phone] 9:05 AM

## 2021-07-21 NOTE — Progress Notes (Signed)
[facility] PROGRESS NOTE  Amy [name] [id number] DOB: September 13, 1946 DOA: 05/19/2021 PCP: [name], MD  Status:  Remains inpatient appropriate because:Unsafe d/c plan, IV treatments appropriate due to intensity of illness or inability to take PO, and Inpatient level of care appropriate due to severity of illness  Dispo: The patient is from: Home              Anticipated d/c is to: SNF              Patient currently is medically stable to d/c.   Difficult to place patient Yes              Barriers to discharge: Dialysis patient with markedly decreased mobility-we will need to prove tolerance of sitting in chair for hemodialysis while here as well as tolerating sitting in wheelchair for up to 1 hour x 2 to replicate transport process.  Cannot discharge and proceed with outpatient hemodialysis unless this is accomplished.    Level of care: Med-Surg  Code Status: DNR Family communication: Spoke with patient's son on 9/23 DVT prophylaxis: Eliquis currently on hold secondary to need for placement of [place] vaccination status: [name] 02/26/2020, Pfizer 04/10/2021    HPI: 75 y.o. 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  Patient presented to the ED on 7/25 with dark stool.  On presentation, hemoglobin was 9.6; GI was consulted.  She underwent EGD which was normal and flexible sigmoidoscopy which showed nonbleeding distal rectal ulcers/stercoral ulcers.  Subsequently, GI signed off.  Neurology was also consulted for altered mental status; MRI brain was similar to prior MRI at [facility] with a large right MCA stroke.   Nephrology has been following for dialysis  needs. General surgery was consulted for G-tube placement but patient does not want a G-tube and recently was deemed to have capacity by psych team.  Son wants patient to have tube since she has a history of not eating well.  Patient having waxing and waning mentation so we have asked psychiatric to see patient again as of 9/21.  See above documentation and my conversation with the son on 9/21 regarding risks of placing gastrostomy tube against patient's wishes as well as risk for open gastrostomy tube placement.  My recommendation was to send patient back to facility and if she stops eating or refuses to eat make her comfort care and stop dialysis.  Subjective: Patient awake.  She is very pleasant.  She is sitting up in chair she has just returned from hemodialysis.  I asked her how hemodialysis when she stated I have not gone to dialysis yet.  Objective: Vitals:   07/20/21 2042 07/21/21 0522  BP: 135/64 (!) 141/68  Pulse: 95 100  Resp: 19 18  Temp: 98 F (36.7 C) 98.4 F (36.9 C)  SpO2: 97% 97%    Intake/Output Summary (Last 24 hours) at 07/21/2021 0819 Last data filed at 07/21/2021 0658 Gross per 24 hour  Intake 1126.9 ml  Output 0 ml  Net 1126.9 ml   Filed Weights   07/17/21 0907 07/17/21 1244 07/17/21 2100  Weight: 82.1 kg 80 kg 97.5 kg    Exam:  Constitutional: NAD, calm, alert Respiratory: Lung sounds are clear to auscultation without any  increased work of breathing, stable on room air. Cardiovascular: Normal heart sounds, normotensive, remain dry Abdomen: Soft and nondistended with normoactive bowel sounds.[name] LBM 9/24 -staff unable to place 10 French NG at bedside Neurologic: CN 2-12 grossly intact -Strength 3/5 on the right. 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  DOES NOT HAVE CAPACITY.  Assessment/Plan: Acute  problems: Acute on chronic lower GI bleeding from stercoral ulcers -Patient presented with anemia,  EGD: normal - Flex sigmoidoscopy: clean base, nonbleeding distal rectal ulcers (stercoral ulcers). -Hemoglobin stable (7-8).     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 Continue Lipitor (lipid pnl/HgbA1c WNL)  Acute delirium Psychiatry has reevaluated the patient and as of 9/23 she was determined to lack capacity-information has been relayed to the son Continue olanzapine at bedtime to help with appetite and nocturnal delirium symptoms  ESRD on HD Patient has tolerated dialysis up in a chair which is a requirement to resume outpatient hemodialysis Continue HD MWF  Hyperkalemia/hypophosphatemia Management per nephrology HD -supplementation as needed   Hypotension Blood pressure currently is stable on midodrine.   Failure to thrive /Generalized deconditioning  /very poor oral intake PEG tube was desired by family but could not be placed because of overlying transverse colon.   Surgery eventually consulted but did not proceed with placement of open G-tube since patient refused. Psychiatric team consulted regarding capacity.  The first 2 evaluations the team thought that the patient had capacity.  A more in-depth interview and assessment was completed on 9/23 where the patient clearly lacks capacity and understanding regarding not eating, not receiving a feeding tube yet continue to request the ability to attend dialysis because she wanted to live. At this point patient has been deemed to not have capacity The surgical team has been reconsulted.  Since the team will change over to another physician over the weekend full evaluation and discussion of surgical procedure would need to  be held by the oncoming physician a full consultation and evaluation will occur on Monday. Eliquis placed on hold for anticipated surgical procedure On 9/23 before psych had reevaluated the patient and determined that she did not have capacity plan was to discharge to SNF and see how she did in that setting regarding her eating.  Because she would need to be without any type of restraint for 24 hours before facility would accept her the cortrack was removed as was the mitten. IR has been consulted to replace nasogastric tube until decision can be made about timing of open gastrostomy tube placement  Constipation Patient was on chronic narcotics prior to admission and these remain in place Continue Chronulac daily  Type 2 diabetes mellitus Episodes of hypoglycemia Hemoglobin A1c 5.1.  Diet controlled at baseline  Chronic thrombus left upper extremity 9/23 Eliquis held and pharmacy managing IV heparin to bridge in anticipation of upcoming surgery  Stage II mid coccyx pressure injury, present on admission Continue wound care    GERD Could also be contributing to patient's poor oral intake Increased frequency of Protonix to BID sitter adding Pepcid     Data Reviewed: Basic Metabolic Panel: Recent Labs  Lab 07/15/21 0812 07/17/21 0930 07/19/21 0421 07/19/21 2109 07/20/21 [id number]  07/20/21 1259  NA 130* 128* 133*  --  130* 131*  K 6.5* 6.3* 6.0* 6.0* 5.9* 5.8*  CL 93* 90* 96*  --  92* 93*  CO2 '29 30 29  '$ --  28 28  GLUCOSE 137* 147* 80  --  170* 112*  BUN 39* 39* 36*  --  43* 42*  CREATININE 3.51* 3.18* 3.26*  --  3.92* 4.06*  CALCIUM 8.4* 8.3* 8.5*  --  8.5* 8.5*  PHOS 2.4* 2.7 3.0  --  4.1  --    Liver Function Tests: Recent Labs  Lab 07/15/21 0812 07/17/21 0930 07/19/21 0421 07/20/21 0708  ALBUMIN 1.8* 1.9* 1.8* 1.7*   No results for input(s): LIPASE, AMYLASE in the last 168 hours. No results for input(s): AMMONIA in the last 168 hours. CBC: Recent Labs  Lab  07/15/21 0812 07/17/21 0930 07/19/21 0421 07/19/21 1450 07/20/21 0708  WBC 11.0* 10.2 14.4* 14.7* 12.3*  HGB 8.3* 9.1* 8.0* 8.5* 8.1*  HCT 27.9* 29.1* 27.4* 28.7* 26.5*  MCV 100.0 98.0 98.2 98.3 96.4  PLT 310 306 248 265 306    CBG: Recent Labs  Lab 07/20/21 1134 07/20/21 1609 07/20/21 2047 07/21/21 0641 07/21/21 0705  GLUCAP 108* 100* 87 65* 148*     Scheduled Meds:  atorvastatin  40 mg Per Tube Daily   chlorhexidine  15 mL Mouth Rinse BID   Chlorhexidine Gluconate Cloth  6 each Topical Daily   darbepoetin (ARANESP) injection - DIALYSIS  200 mcg Intravenous Q Thu-HD   feeding supplement  237 mL Oral BID BM   feeding supplement (PROSource TF)  45 mL Per Tube Daily   gabapentin  100 mg Per Tube Q12H   insulin aspart  0-6 Units Subcutaneous TID WC   lactulose  20 g Per Tube Daily   mouth rinse  15 mL Mouth Rinse q12n4p   midodrine  10 mg Per Tube TID WC   mirtazapine  7.5 mg Per Tube QHS   multivitamin  1 tablet Per Tube QHS   OLANZapine  2.5 mg Per Tube QHS   pantoprazole sodium  40 mg Per Tube Daily   sodium chloride flush  10-40 mL Intracatheter Q12H   thiamine  100 mg Per Tube Daily   venlafaxine  37.5 mg Per Tube BID   Continuous Infusions:  sodium chloride     sodium chloride     dextrose 40 mL/hr at 07/20/21 1501   feeding supplement (OSMOLITE 1.5 CAL)     heparin 800 Units/hr (07/20/21 1919)   iron sucrose Stopped (07/17/21 1500)    Principal Problem:   GI bleed Active Problems:   Essential hypertension   Type 2 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, with long-term current use of insulin (HCC)   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   ESRD on dialysis (HCC)   Dyslipidemia   Hypokalemia   Stage 2 skin ulcer of sacral region ([place])   Cerebral thrombosis with cerebral infarction   Protein-calorie malnutrition, severe   Acute GI bleeding   Oropharyngeal dysphagia   Failure to thrive in adult   Inadequate  oral intake   Clogged feeding tube   Fever   Rectal bleeding   Delirium due to another medical condition   Consultants: Vascular surgery Nephrology Gastroenterology Neurology Palliative medicine General surgery  Procedures: EEG Echocardiogram Cortrack EGD  Antibiotics: Ceftriaxone 8/3 through 8/5   Time spent: 25 minutes    Erin Hearing ANP  [facility]  7 am - 330 pm/M-F for direct patient care and secure chat Please refer to Amion for contact info 62  days

## 2021-07-21 NOTE — Progress Notes (Signed)
ANTICOAGULATION CONSULT NOTE Pharmacy Consult for Heparin Indication:  h/o CVA and chronic UE thrombus  Allergies  Allergen Reactions   Sensipar [Cinacalcet]     Per MAR   Statins     Per [facility]    Patient Measurements: Height: '5\' 7"'$  (170.2 cm) Weight:  (In chair) IBW/kg (Calculated) : 61.6 Heparin Dosing Weight:  85 kg  Vital Signs: Temp: 98 F (36.7 C) (09/26 2100) Temp Source: Oral (09/26 0942) BP: 150/78 (09/26 2100) Pulse Rate: 96 (09/26 2100)  Labs: Recent Labs    07/19/21 0421 07/19/21 0729 07/19/21 1450 07/19/21 1934 07/20/21 0708 07/20/21 1259 07/21/21 0823 07/21/21 1855 07/21/21 1908  HGB 8.0*  --  8.5*  --  8.1*  --  8.3*  --   --   HCT 27.4*  --  28.7*  --  26.5*  --  27.2*  --   --   PLT 248  --  265  --  306  --  363  --   --   APTT >200*   < >  --    < > 87*  --  >200*  --  50*  HEPARINUNFRC >1.10*   < >  --    < > 1.02*  --  0.66 0.36  --   CREATININE 3.26*  --   --   --  3.92* 4.06*  --   --   --    < > = values in this interval not displayed.     Estimated Creatinine Clearance: 14.8 mL/min (A) (by C-G formula based on SCr of 4.06 mg/dL (H)).  Assessment: 75 y.o. female with hx CVA and chronic UE thrombus.  Patient with prolonged hospitalization and difficult disposition.  Family has elected to pursue PEG tube placement for ongoing nutritional needs.  Surgery consulted.  Will stop Eliquis and bridge with heparin pending surgical plans. Last dose Eliquis 9/23 at 0900.   aPTT earlier  9/25y came back therapeutic at 87, on 800 units/hr. Level scheduled to be collected at 1700 last night - however, phlebotomy was unable to get level despite 3 attempts. Per policy, cannot re-attempt draw for 24 hours from last attempt (~1800 on 9/26).  aPTT and HL was drawn in HD this AM 9/26.  aPTT >200, HL =0.66  on heparin 800 units/hr.  These two results do not correlate/ a bit confusing.  HL is therapeutic and has come down from previous days as expected after off  apixaban since 9/23 AM.  PTT high possibly due to drawn in HD. I will decrease heparin rate in response to PTT >200, HL 0.66 and recheck labs in ~ 6-8 hours.  No bleelding noted per RN.   CBC stable.   Heparin level came back therapeutic at 0.36, aPTT came back subtherapeutic at 50, on 700 units/hr. No s/sx of bleeding or infusion issues. Has been >72 hr from last dose of apixaban so anticipate heparin level might be accurate.   Goal of Therapy:  aPTT 66-102 Heparin level 0.3-0.7 units/ml Monitor platelets by anticoagulation protocol: Yes   Plan:  Increase heparin drip to 750 units/hr given low aPTT and low end therapeutic heparin level Check 8 hour heparin level and aPTT Daily heparin levels, aPTT, and CBC while on heparin  Antonietta Jewel, PharmD, [place] Clinical Pharmacist  Phone: [phone] 07/21/2021 9:37 PM  Please check AMION for all [place] phone numbers After 10:00 PM, call [place] [phone]

## 2021-07-21 NOTE — Progress Notes (Signed)
[facility] Progress Note   Subjective:  Brought to HD unit -- couldn't get TDC to pull, will instill with tPA overnight and try again tomorrow. At this time, her eyes are open but she is not tracking me nor speaking (MS fluctuates greatly).   Objective Vitals:   07/20/21 2042 07/21/21 0522 07/21/21 0822 07/21/21 0828  BP: 135/64 (!) 141/68 (!) 165/75 (!) 162/83  Pulse: 95 100 97 96  Resp: '19 18 13 15  '$ Temp: 98 F (36.7 C) 98.4 F (36.9 C) 98.8 F (37.1 C)   TempSrc: Oral  Oral   SpO2: 97% 97% 100%   Weight:      Height:       Physical Exam General: Chronically ill woman, NAD. Not responsive to questioning today Heart: RRR; 2/6 murmur Lungs: CTA anteroirly Abdomen: soft Extremities: 1+ thigh/hip edema and UE Dialysis Access: [facility]  Additional Objective Labs: Basic Metabolic Panel: Recent Labs  Lab 07/17/21 0930 07/19/21 0421 07/19/21 2109 07/20/21 0708 07/20/21 1259  NA 128* 133*  --  130* 131*  K 6.3* 6.0* 6.0* 5.9* 5.8*  CL 90* 96*  --  92* 93*  CO2 30 29  --  28 28  GLUCOSE 147* 80  --  170* 112*  BUN 39* 36*  --  43* 42*  CREATININE 3.18* 3.26*  --  3.92* 4.06*  CALCIUM 8.3* 8.5*  --  8.5* 8.5*  PHOS 2.7 3.0  --  4.1  --    Liver Function Tests: Recent Labs  Lab 07/17/21 0930 07/19/21 0421 07/20/21 0708  ALBUMIN 1.9* 1.8* 1.7*   CBC: Recent Labs  Lab 07/15/21 0812 07/17/21 0930 07/19/21 0421 07/19/21 1450 07/20/21 0708  WBC 11.0* 10.2 14.4* 14.7* 12.3*  HGB 8.3* 9.1* 8.0* 8.5* 8.1*  HCT 27.9* 29.1* 27.4* 28.7* 26.5*  MCV 100.0 98.0 98.2 98.3 96.4  PLT 310 306 248 265 306   Blood Culture    Component Value Date/Time   SDES BLOOD RIGHT ANTECUBITAL 06/26/2021 1234   SPECREQUEST  06/26/2021 1234    BOTTLES DRAWN AEROBIC AND ANAEROBIC Blood Culture adequate volume   CULT  06/26/2021 1234    NO GROWTH 5 DAYS Performed at [facility], [place] [address] [place], [place] [address]    REPTSTATUS 07/01/2021 FINAL 06/26/2021  1234   Medications:  sodium chloride     sodium chloride     dextrose 40 mL/hr at 07/20/21 1501   feeding supplement (OSMOLITE 1.5 CAL)     heparin 800 Units/hr (07/20/21 1919)   iron sucrose Stopped (07/17/21 1500)    atorvastatin  40 mg Per Tube Daily   chlorhexidine  15 mL Mouth Rinse BID   Chlorhexidine Gluconate Cloth  6 each Topical Daily   darbepoetin (ARANESP) injection - DIALYSIS  200 mcg Intravenous Q Thu-HD   feeding supplement  237 mL Oral BID BM   feeding supplement (PROSource TF)  45 mL Per Tube Daily   gabapentin  100 mg Per Tube Q12H   insulin aspart  0-6 Units Subcutaneous TID WC   lactulose  20 g Per Tube Daily   mouth rinse  15 mL Mouth Rinse q12n4p   midodrine  10 mg Per Tube TID WC   mirtazapine  7.5 mg Per Tube QHS   multivitamin  1 tablet Per Tube QHS   OLANZapine  2.5 mg Per Tube QHS   pantoprazole sodium  40 mg Per Tube Daily   sodium chloride flush  10-40 mL Intracatheter  Q12H   thiamine  100 mg Per Tube Daily   venlafaxine  37.5 mg Per Tube BID    Dialysis Orders: AMS/FTT: Mental status waxes/wanes. Also uses restraints on/off which has caused her to be in bed occasionally for HD. Pt will have to be able to sit in a chair for 4-6 hrs without restraints to qualify for OP HD w/ our group.  If she continues to tolerate sitting in the chair for dialysis and cooperate, then will re-CLIP the patient as her OP HD unit will not take her back. HD Nurse Coordinator and Renal Navigator following, stated prior to re-clipping her, we need to ensure patient nutritional status is stable. Core Track tube feeding has been pulled and patient is able to take medication by mouth.  Melena/Rectal bleeding - GI consulted. Hx rectal ulcers.  EGD 7/27 normal, flex sig with mucosal ulceration c/w sterocoral ulcers, non bleeding. Hgb 8s, stable. ESRD: Back to MWF schedule - TDC not working today, instilled tPA and will try again tomorrow. K has been high - plan Lokelma 10g x 2  today. Dysphagia - Patient refusing G-tube placement.  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 AVG clotted 9/2, unable to be declotted d/t arm contracture. S/P conversion to tunneled HD catheter 9/7. Not running today - tPA now, see above. Hypertension/volume: BP high recently, on midodrine '10mg'$  TID with significant overload on exam.  Continue to maximize UF as tolerated, can give albumin with HD for BP support.   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 CoreTrac removed-patient taking medications by mouth-continue assisting and encouraging patient with meals. 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 9. Psych eval from 9/14 determined that patient has functional mental capacity for medical decision-making. Psych re-consulted 9/23 to evaluate the patient again - at this point, patient does not have the capacity to make medical decisions. Ongoing discussions with patient's son continues. 10. Anemia: Continue Aranesp-dose increase to 200 mcg q week on 9/15.  Last Hgb 8.1.   11. Dispo: DNR/DNI. Poor prognosis. Palliative care has seen patient. Appreciate their involvement. Although patient is currently tolerating being in the chair for HD, we do not believe dialysis is adding to her quality of life. Currently, patient no longer has capacity to make medical decisions-please see #9. Ongoing discussions with multi-discipline team and patient's son. Plan for primary team to re-consult Surgery for Gtube placement per Son's request. Reviewed last surgery note-plan to re-consult on 9/26.  Amy Penton, PA-C 07/21/2021, 9:03 AM  [employer]

## 2021-07-21 NOTE — Plan of Care (Signed)
  Problem: Safety: Goal: Ability to remain free from injury will improve Outcome: Progressing   

## 2021-07-21 NOTE — Progress Notes (Addendum)
11:30am: CSW spoke with Santiago Glad at [facility] who states the authorization request was denied.   Patient may be receiving a PEG soon which may qualify her for additional time at SNF.  9:45am: CSW spoke with patient's son [name] to discuss discharge planning. [name] states he has not applied for Medicaid on the patient's behalf due to her owning a home, but that he is attempting to address that matter. [name] states he nor the patient are able to pay privately for SNF placement.  The insurance authorization through [facility] is still pending and will be likely be denied due to the lack of documentation from therapies.  Madilyn Fireman, MSW, LCSW Transitions of Care  Clinical Social Worker II [phone]

## 2021-07-21 NOTE — Consult Note (Signed)
Reason for Consult: Reevaluate capacity. Patient has been intermittently hallucinating, still not eating Referring Physician: Samella Parr, NP   The patient was last seen by the psychiatry service on 9/23. Interim documentation by primary team and nursing staff has been reviewed. At this time, patient is stable and there is no evidence of acute psychiatric disturbance requiring ongoing psychiatric consultation. Please see last consult note for full assessment. Final medication recommendations are as follows:   -Continue Zyprexa 2.5 mg daily -Continue Effexor 37.5 mg BID    We will sign off at this time. This has been communicated to the primary team. If issues arise in the future, don't hesitate to contact the Psychiatry Inpatient Consult Service.    Fatima Sanger MD Resident

## 2021-07-21 NOTE — Progress Notes (Addendum)
[facility] Progress Note  61 Days Post-Op  Subjective: CC: asked to re-evaluate patient for open G-tube, as psychiatry determined that the patient lost capacity to make medical decisions and patients family would like for her to have a g-tube. Today during my exam the patient is oriented to person, hospital, and states the reason she is here is because she was in a coma. She tells me that she does not want a feeding tube. She is on a regular diet per speech but PO intake is limited by her personal motivation to eat.  Patient seen in HD. Unable to get HD due to catheter occlusion, tPA placed in her HD cath and she is going to be reassessed by nephrology tomorrow.   Objective: Vital signs in last 24 hours: Temp:  [98 F (36.7 C)-98.8 F (37.1 C)] 98 F (36.7 C) (09/26 0942) Pulse Rate:  [88-100] 88 (09/26 0942) Resp:  [13-19] 18 (09/26 0942) BP: (135-165)/(64-83) 155/78 (09/26 0942) SpO2:  [97 %-100 %] 100 % (09/26 0942) Last BM Date: 07/19/21  Intake/Output from previous day: 09/25 0701 - 09/26 0700 In: 1126.9 [P.O.:60; I.V.:1066.9] Out: 0  Intake/Output this shift: Total I/O In: 0  Out: -7   PE: Gen:  somnolent but arouses to answer questions Card:  Regular rate and rhythm, pedal pulses 2+ BL Pulm:  Normal effort, clear to auscultation bilaterally Abd: Soft, non-tender, non-distended, previous laparoscopic surgery scars appear well-healed  Skin: warm and dry, no rashes  Psych: oriented to person, hospital  Lab Results:  Recent Labs    07/20/21 0708 07/21/21 0823  WBC 12.3* 9.4  HGB 8.1* 8.3*  HCT 26.5* 27.2*  PLT 306 363   BMET Recent Labs    07/20/21 0708 07/20/21 1259  NA 130* 131*  K 5.9* 5.8*  CL 92* 93*  CO2 28 28  GLUCOSE 170* 112*  BUN 43* 42*  CREATININE 3.92* 4.06*  CALCIUM 8.5* 8.5*   PT/INR No results for input(s): LABPROT, INR in the last 72 hours. CMP     Component Value Date/Time   NA 131 (L) 07/20/2021 1259   K 5.8 (H)  07/20/2021 1259   K 4.3 08/16/2012 1201   CL 93 (L) 07/20/2021 1259   CO2 28 07/20/2021 1259   GLUCOSE 112 (H) 07/20/2021 1259   BUN 42 (H) 07/20/2021 1259   CREATININE 4.06 (H) 07/20/2021 1259   CALCIUM 8.5 (L) 07/20/2021 1259   PROT 5.8 (L) 07/07/2021 0027   ALBUMIN 1.7 (L) 07/20/2021 0708   AST 28 07/07/2021 0027   ALT 6 07/07/2021 0027   ALKPHOS 90 07/07/2021 0027   BILITOT 0.4 07/07/2021 0027   GFRNONAA 11 (L) 07/20/2021 1259   Lipase     Component Value Date/Time   LIPASE 31 01/18/2021 1548       Studies/Results: No results found.  Anti-infectives: Anti-infectives (From admission, onward)    Start     Dose/Rate Route Frequency Ordered Stop   07/02/21 1116  ceFAZolin (ANCEF) IVPB 2g/100 mL premix        over 30 Minutes Intravenous Continuous PRN 07/02/21 1139 07/02/21 1116   07/02/21 1111  ceFAZolin (ANCEF) 2-4 GM/100ML-% IVPB       Note to Pharmacy: Domenick Bookbinder   : cabinet override      07/02/21 1111 07/02/21 2314   07/01/21 1215  ceFAZolin (ANCEF) IVPB 2g/100 mL premix        2 g 200 mL/hr over 30 Minutes Intravenous On call 07/01/21 0902  07/02/21 1215   05/29/21 1622  ceFAZolin (ANCEF) 2-4 GM/100ML-% IVPB       Note to Pharmacy: Lytle Butte   : cabinet override      05/29/21 1622 05/30/21 0429   05/29/21 0000  cefTRIAXone (ROCEPHIN) 1 g in sodium chloride 0.9 % 100 mL IVPB  Status:  Discontinued        1 g 200 mL/hr over 30 Minutes Intravenous Every 24 hours 05/28/21 1801 05/28/21 1801   05/28/21 1830  cefTRIAXone (ROCEPHIN) 1 g in sodium chloride 0.9 % 100 mL IVPB        1 g 200 mL/hr over 30 Minutes Intravenous Every 24 hours 05/28/21 1801 05/30/21 1907   05/28/21 1630  ceFAZolin (ANCEF) IVPB 2g/100 mL premix        2 g 200 mL/hr over 30 Minutes Intravenous To Radiology 05/28/21 1537 05/29/21 1630       Assessment/Plan  Malnutrition/refusal to eat/failure to thrive   - patient mental status waxes and wanes but she was recently deemed not to  have the capacity to make medical decisions, according to psychiatry.  - SLP last evaluated her 8/26 and said all of her goals were met, she does not have dysphagia.  - given her comorbid conditions the patient is a high risk for perioperative complications, worsening delirium, and further functional decline. - I will discuss this patients plan of care with my attending however, in the absence of dysphagia,  given the perioperative risks and the patients history of not desiring a feeding tube, may need ethics consult prior to proceeding with surgical planning for open G tube.  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

## 2021-07-22 DIAGNOSIS — E43 Unspecified severe protein-calorie malnutrition: Secondary | ICD-10-CM | POA: Diagnosis not present

## 2021-07-22 DIAGNOSIS — R627 Adult failure to thrive: Secondary | ICD-10-CM | POA: Diagnosis not present

## 2021-07-22 DIAGNOSIS — I6330 Cerebral infarction due to thrombosis of unspecified cerebral artery: Secondary | ICD-10-CM | POA: Diagnosis not present

## 2021-07-22 DIAGNOSIS — K922 Gastrointestinal hemorrhage, unspecified: Secondary | ICD-10-CM | POA: Diagnosis not present

## 2021-07-22 DIAGNOSIS — K625 Hemorrhage of anus and rectum: Secondary | ICD-10-CM | POA: Diagnosis not present

## 2021-07-22 LAB — CBC
HCT: 27.2 % — ABNORMAL LOW (ref 36.0–46.0)
Hemoglobin: 8.2 g/dL — ABNORMAL LOW (ref 12.0–15.0)
MCH: 28.4 pg (ref 26.0–34.0)
MCHC: 30.1 g/dL (ref 30.0–36.0)
MCV: 94.1 fL (ref 80.0–100.0)
Platelets: 354 10*3/uL (ref 150–400)
RBC: 2.89 MIL/uL — ABNORMAL LOW (ref 3.87–5.11)
RDW: 17.7 % — ABNORMAL HIGH (ref 11.5–15.5)
WBC: 9.5 10*3/uL (ref 4.0–10.5)
nRBC: 0 % (ref 0.0–0.2)

## 2021-07-22 LAB — GLUCOSE, CAPILLARY
Glucose-Capillary: 64 mg/dL — ABNORMAL LOW (ref 70–99)
Glucose-Capillary: 139 mg/dL — ABNORMAL HIGH (ref 70–99)
Glucose-Capillary: 94 mg/dL (ref 70–99)
Glucose-Capillary: 99 mg/dL (ref 70–99)
Glucose-Capillary: 93 mg/dL (ref 70–99)

## 2021-07-22 LAB — HEPARIN LEVEL (UNFRACTIONATED): Heparin Unfractionated: 0.35 IU/mL (ref 0.30–0.70)

## 2021-07-22 LAB — RENAL FUNCTION PANEL
Albumin: 1.6 g/dL — ABNORMAL LOW (ref 3.5–5.0)
Anion gap: 11 (ref 5–15)
BUN: 42 mg/dL — ABNORMAL HIGH (ref 8–23)
CO2: 26 mmol/L (ref 22–32)
Calcium: 7.9 mg/dL — ABNORMAL LOW (ref 8.9–10.3)
Chloride: 84 mmol/L — ABNORMAL LOW (ref 98–111)
Creatinine, Ser: 4.32 mg/dL — ABNORMAL HIGH (ref 0.44–1.00)
GFR, Estimated: 10 mL/min — ABNORMAL LOW (ref >60)
Glucose, Bld: 101 mg/dL — ABNORMAL HIGH (ref 70–99)
Phosphorus: >30 mg/dL — ABNORMAL HIGH (ref 2.5–4.6)
Potassium: 4 mmol/L (ref 3.5–5.1)
Sodium: 121 mmol/L — ABNORMAL LOW (ref 135–145)

## 2021-07-22 LAB — APTT: aPTT: 61 seconds — ABNORMAL HIGH (ref 24–36)

## 2021-07-22 MED ORDER — DEXTROSE 50 % IV SOLN
INTRAVENOUS | Status: AC
  Administered 2021-07-22: 25 mL
  Filled 2021-07-22: qty 50

## 2021-07-22 MED ORDER — HEPARIN SODIUM (PORCINE) 1000 UNIT/ML IJ SOLN
INTRAMUSCULAR | Status: AC
  Filled 2021-07-22: qty 4

## 2021-07-22 MED ORDER — DEXTROSE 50 % IV SOLN: 12.5000 g | INTRAVENOUS | Status: AC

## 2021-07-22 NOTE — Plan of Care (Signed)
  Problem: Activity: Goal: Risk for activity intolerance will decrease Outcome: Progressing   Problem: Safety: Goal: Ability to remain free from injury will improve Outcome: Progressing   

## 2021-07-22 NOTE — Procedures (Signed)
Patient seen on Hemodialysis while in a recliner. BP 119/88   Pulse (!) 59   Temp 98.3 F (36.8 C) (Oral)   Resp 16   Ht '5\' 7"'$  (1.702 m)   Wt 97.5 kg   LMP  (LMP Unknown)   SpO2 100%   BMI 33.67 kg/m   QB 400, UF goal 2.5L Tolerating treatment without complaints at this time.   Amy Shiley MD [facility] Office # [phone] Pager # [phone] 9:29 AM

## 2021-07-22 NOTE — Progress Notes (Signed)
SLP Cancellation Note  Patient Details Name: BRENN EVANGELISTA MRN: [id number] DOB: Nov 06, 1945   Cancelled treatment:       Reason Eval/Treat Not Completed: Attempted to see pt earlier for bedside swallow evaluation, however pt was in HD. SLP to f/u on subsequent date.   Ellwood Dense, [place], [place] [facility] Office Number: [phone]  Acie Fredrickson 07/22/2021, 3:01 PM

## 2021-07-22 NOTE — Progress Notes (Signed)
ANTICOAGULATION CONSULT NOTE Pharmacy Consult for Heparin Indication:  h/o CVA and chronic UE thrombus  Allergies  Allergen Reactions   Sensipar [Cinacalcet]     Per MAR   Statins     Per [facility]    Patient Measurements: Height: '5\' 7"'$  (170.2 cm) Weight:  (In chair) IBW/kg (Calculated) : 61.6 Heparin Dosing Weight:  85 kg  Vital Signs: Temp: 98 F (36.7 C) (09/26 2100) BP: 150/78 (09/26 2100) Pulse Rate: 96 (09/26 2100)  Labs: Recent Labs    07/19/21 0421 07/19/21 0729 07/20/21 0708 07/20/21 1259 07/21/21 0823 07/21/21 1855 07/21/21 1908 07/22/21 0312  HGB 8.0*   < > 8.1*  --  8.3*  --   --  8.2*  HCT 27.4*   < > 26.5*  --  27.2*  --   --  27.2*  PLT 248   < > 306  --  363  --   --  354  APTT >200*   < > 87*  --  >200*  --  50* 61*  HEPARINUNFRC >1.10*   < > 1.02*  --  0.66 0.36  --  0.35  CREATININE 3.26*  --  3.92* 4.06*  --   --   --   --    < > = values in this interval not displayed.     Estimated Creatinine Clearance: 14.8 mL/min (A) (by C-G formula based on SCr of 4.06 mg/dL (H)).  Assessment: 75 y.o. female with hx CVA and chronic UE thrombus.  Patient with prolonged hospitalization and difficult disposition.  Family has elected to pursue PEG tube placement for ongoing nutritional needs.  Surgery consulted.  Will stop Eliquis and bridge with heparin pending surgical plans. Last dose Eliquis 9/23 at 0900.   aPTT earlier  9/25y came back therapeutic at 87, on 800 units/hr. Level scheduled to be collected at 1700 last night - however, phlebotomy was unable to get level despite 3 attempts. Per policy, cannot re-attempt draw for 24 hours from last attempt (~1800 on 9/26).  aPTT and HL was drawn in HD this AM 9/26.  aPTT >200, HL =0.66  on heparin 800 units/hr.  These two results do not correlate/ a bit confusing.  HL is therapeutic and has come down from previous days as expected after off apixaban since 9/23 AM.  PTT high possibly due to drawn in HD. I will decrease  heparin rate in response to PTT >200, HL 0.66 and recheck labs in ~ 6-8 hours.  No bleelding noted per RN.   CBC stable.   Heparin level came back therapeutic at 0.36, aPTT came back subtherapeutic at 50, on 700 units/hr. No s/sx of bleeding or infusion issues. Has been >72 hr from last dose of apixaban so anticipate heparin level might be accurate.  9/27 AM update:  Heparin level continues to be therapeutic  Correlating with aPTT so no further aPTT's are needed   Goal of Therapy:  Heparin level 0.3-0.7 units/ml Monitor platelets by anticoagulation protocol: Yes   Plan:  Cont heparin at 750 units/hr Daily CBC/heparin level Monitor for bleeding  Narda Bonds, PharmD, BCPS Clinical Pharmacist Phone: [phone]

## 2021-07-22 NOTE — Progress Notes (Signed)
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  She had altered mental status seen by neurology MRI similar to prior MRI at [facility] with a large right MCA stroke, seen by nephrology for dialysis need.  Patient having ongoing confusion weakness inability to sit in dialysis recliner and so not accepted back to outpatient dialysis at this time and awaiting further improvement.  She has had poor intake failure to thrive family wanted PEG tube I had tried but was unable to place because of overlying transverse colon and has been on core track NG tube feeding.  Seen by surgery for G-tube placement.  Core track was clogged and changed 07/07/21. 9/13-psych consulted for competency and patient does have functional mental capacity for medical decision and has refused G tube. Extensive assessment done at bedside 9/23 with psychiatry primary team and nephrology, and felt that patient unable to make complex medical decision.  However patient keeps saying she does not want feeding tube.  Subjective: Seen and examined this morning in dialysis alert awake oriented to self, current place month year president   Assessment & Plan:  Acute on chronic lower GI bleeding from stercoral ulcer S/P EGD and flex sigmoidoscopy.HH stable  Anemia of chronic renal disease: hb stable.  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 Refusing to eat food here. On Lipitor and AC w/ heparin gtt.Continue Zyprexa per psychiatry.    Leukocytosis resolved. Recent Labs  Lab 07/19/21 0421 07/19/21 1450 07/20/21 0708 07/21/21 0823 07/22/21 0312  WBC 14.4* 14.7* 12.3* 9.4 9.5     Failure to thrive/generalized deconditioning, poor oral intake  Severe protein calorie malnutrition Dysphagia ruled out speech eval 8/25 Patient adamant on not eating hospital food and states "I will eat food from home " but she has not eaten in several weeks.-NG core track tube.  Surgery reevaluated and determined to put feeding tube and the patient does not have dysphagia and, verbal and also refusing feeding tube.  SLP evaluation requested.  ESRD on HD MWF .now on TTS schedule, continue HD per nephrology.    Hyperkalemia -resolved.  Monitor  Recent Labs  Lab 07/19/21 0421 07/19/21 2109 07/20/21 0708 07/20/21 1259 07/22/21 0805  K 6.0* 6.0* 5.9* 5.8* 4.0    Hyponatremia per HD.  Sodium 121 this morning repeat Recent Labs  Lab 07/17/21 0930 07/19/21 0421 07/20/21 0708 07/20/21 1259 07/22/21 0805  NA 128* 133* 130* 131* 121*    Constipation-resolved  Hypotension BP stable.  Continue midodrine.  T2DM with episodes of hypoglycemia A1c 5.1, blood sugar 80 mid-to-lower after stopping tube feeding.  Continue on D10  Recent Labs  Lab 07/21/21 2103  07/22/21 0628 07/22/21 0657 07/22/21 1121 07/22/21 1619  GLUCAP 77 64* 139* 94 99     Chronic thrombus left upper extremity transitioned to heparin for now once no procedure can resume Eliquis  GERD -cont ppi Stage II mid coccyx  pressure injury GOC: currently DNR.  Followed by palliative care.  Overall prognosis guarded/poor, son has been wanting ongoing dialysis and feeding tube placement for nutrition.  Patient does not want feeding tube but she wants to continue with dialysis.  Palliative care has been consulted.  DTP-TOC following  Diet Order             Diet regular Room service appropriate? Yes; Fluid consistency: Thin  Diet effective now                   Nutrition Problem: Severe Malnutrition Etiology: chronic illness (ESRD on HD, prior CVA) Signs/Symptoms: percent weight loss, severe muscle depletion, energy intake < or equal to 75% for > or equal to 1 month Percent weight loss: 16.4 % Interventions: Nepro shake, Magic cup, MVI Patient's Body mass index is 23.62 kg/m.  DVT prophylaxis: Place TED hose Start: 05/19/21 1706 Code Status:   Code Status: DNR  Family Communication: plan of care discussed during the multidisciplinary meeting,   Remains inpatient appropriate because:Inpatient level of care appropriate due to severity of illness  Dispo: The patient is from: Home              Anticipated d/c is to: TBD              Patient currently is not medically stable   Difficult to place patient Yes Unresulted Labs (From admission, onward)     Start     Ordered   07/23/21 0500  Heparin level (unfractionated)  Daily,   R     Question:  Specimen collection method  Answer:  Lab=Lab collect   07/21/21 2142   07/20/21 0500  CBC  Daily,   R     Question:  Specimen collection method  Answer:  Lab=Lab collect   07/18/21 1301            Medications reviewed:  Scheduled Meds:  (feeding supplement) PROSource Plus  30 mL Oral BID BM   atorvastatin  40 mg Oral Daily   chlorhexidine  15 mL Mouth Rinse BID   Chlorhexidine Gluconate Cloth  6 each Topical Daily   darbepoetin (ARANESP) injection - DIALYSIS  200 mcg Intravenous Q Thu-HD   feeding supplement  237 mL Oral BID BM   gabapentin  100 mg  Oral Q12H   heparin sodium (porcine)       insulin aspart  0-6 Units Subcutaneous TID WC   lactulose  20 g Oral Daily   mouth rinse  15 mL Mouth Rinse q12n4p   midodrine  10 mg Oral TID WC   mirtazapine  7.5 mg Oral QHS   multivitamin  1 tablet Oral QHS   OLANZapine zydis  2.5 mg Oral QHS   pantoprazole sodium  40 mg Oral Daily   sodium chloride flush  10-40 mL Intracatheter Q12H   thiamine  100 mg Oral Daily   venlafaxine  37.5 mg Oral BID   Continuous Infusions:  dextrose 40 mL/hr at 07/21/21 1840   heparin 750 Units/hr (07/21/21 2142)   Consultants:see note  Procedures:see note Antimicrobials: Anti-infectives (From admission, onward)    Start     Dose/Rate Route Frequency Ordered Stop   07/02/21 1116  ceFAZolin (ANCEF)  IVPB 2g/100 mL premix        over 30 Minutes Intravenous Continuous PRN 07/02/21 1139 07/02/21 1116   07/02/21 1111  ceFAZolin (ANCEF) 2-4 GM/100ML-% IVPB       Note to Pharmacy: Domenick Bookbinder   : cabinet override      07/02/21 1111 07/02/21 2314   07/01/21 1215  ceFAZolin (ANCEF) IVPB 2g/100 mL premix        2 g 200 mL/hr over 30 Minutes Intravenous On call 07/01/21 0902 07/02/21 1215   05/29/21 1622  ceFAZolin (ANCEF) 2-4 GM/100ML-% IVPB       Note to Pharmacy: Lytle Butte   : cabinet override      05/29/21 1622 05/30/21 0429   05/29/21 0000  cefTRIAXone (ROCEPHIN) 1 g in sodium chloride 0.9 % 100 mL IVPB  Status:  Discontinued        1 g 200 mL/hr over 30 Minutes Intravenous Every 24 hours 05/28/21 1801 05/28/21 1801   05/28/21 1830  cefTRIAXone (ROCEPHIN) 1 g in sodium chloride 0.9 % 100 mL IVPB        1 g 200 mL/hr over 30 Minutes Intravenous Every 24 hours 05/28/21 1801 05/30/21 1907   05/28/21 1630  ceFAZolin (ANCEF) IVPB 2g/100 mL premix        2 g 200 mL/hr over 30 Minutes Intravenous To Radiology 05/28/21 1537 05/29/21 1630      Culture/Microbiology    Component Value Date/Time   SDES BLOOD RIGHT ANTECUBITAL 06/26/2021 1234    SPECREQUEST  06/26/2021 1234    BOTTLES DRAWN AEROBIC AND ANAEROBIC Blood Culture adequate volume   CULT  06/26/2021 1234    NO GROWTH 5 DAYS Performed at [facility], [place] [address], [place], [place] [address]    REPTSTATUS 07/01/2021 FINAL 06/26/2021 1234    Other culture-see note  Objective: Vitals: Today's Vitals   07/22/21 1030 07/22/21 1050 07/22/21 1122 07/22/21 1123  BP: (!) 88/49 128/82  (!) 132/93  Pulse: (!) 118 (!) 119  83  Resp:  20  20  Temp:  98 F (36.7 C)  98 F (36.7 C)  TempSrc:  Oral  Oral  SpO2:  100%  94%  Weight:   68.4 kg   Height:      PainSc:        Intake/Output Summary (Last 24 hours) at 07/22/2021 1706 Last data filed at 07/22/2021 1040 Gross per 24 hour  Intake --  Output 2232 ml  Net -2232 ml    Filed Weights   07/22/21 1122  Weight: 68.4 kg   Weight change:   Intake/Output from previous day: 09/26 0701 - 09/27 0700 In: 426.4 [P.O.:120; I.V.:306.4] Out: -5 [Stool:2] Intake/Output this shift: Total I/O In: -  Out: 2230 [Other:2230] Filed Weights   07/22/21 1122  Weight: 68.4 kg   Examination: General exam: AA, pleasantly confused,older than stated age, weak appearing. HEENT:Oral mucosa moist, Ear/Nose WNL grossly, dentition normal. Respiratory system: bilaterally diminished, no use of accessory muscle Cardiovascular system: S1 & S2 +, No JVD,. Gastrointestinal system: Abdomen soft, NT,ND, BS+ Nervous System:Alert, awake, moving right side, chronic weakness on left side Extremities: Edematous bilateral upper extremities, distal peripheral pulses palpable.  Skin: No rashes,no icterus. MSK: Normal muscle bulk,tone, power  Permcath for HD +  Data Reviewed: I have personally reviewed following labs and imaging studies CBC: Recent Labs  Lab 07/19/21 0421 07/19/21 1450 07/20/21 0708 07/21/21 0823 07/22/21 0312  WBC 14.4* 14.7* 12.3* 9.4 9.5  HGB 8.0*  8.5* 8.1* 8.3* 8.2*  HCT 27.4* 28.7* 26.5* 27.2* 27.2*  MCV 98.2  98.3 96.4 94.8 94.1  PLT 248 265 306 363 [id number]   Basic Metabolic Panel: Recent Labs  Lab 07/17/21 0930 07/19/21 0421 07/19/21 2109 07/20/21 0708 07/20/21 1259 07/22/21 0805  NA 128* 133*  --  130* 131* 121*  K 6.3* 6.0* 6.0* 5.9* 5.8* 4.0  CL 90* 96*  --  92* 93* 84*  CO2 30 29  --  '28 28 26  '$ GLUCOSE 147* 80  --  170* 112* 101*  BUN 39* 36*  --  43* 42* 42*  CREATININE 3.18* 3.26*  --  3.92* 4.06* 4.32*  CALCIUM 8.3* 8.5*  --  8.5* 8.5* 7.9*  PHOS 2.7 3.0  --  4.1  --  >30.0*    GFR: Estimated Creatinine Clearance: 11.3 mL/min (A) (by C-G formula based on SCr of 4.32 mg/dL (H)). Liver Function Tests: Recent Labs  Lab 07/17/21 0930 07/19/21 0421 07/20/21 0708 07/22/21 0805  ALBUMIN 1.9* 1.8* 1.7* 1.6*    No results for input(s): LIPASE, AMYLASE in the last 168 hours. No results for input(s): AMMONIA in the last 168 hours. Coagulation Profile: No results for input(s): INR, PROTIME in the last 168 hours. Cardiac Enzymes: No results for input(s): CKTOTAL, CKMB, CKMBINDEX, TROPONINI in the last 168 hours. BNP (last 3 results) No results for input(s): PROBNP in the last 8760 hours. HbA1C: No results for input(s): HGBA1C in the last 72 hours. CBG: Recent Labs  Lab 07/21/21 2103 07/22/21 0628 07/22/21 0657 07/22/21 1121 07/22/21 1619  GLUCAP 77 64* 139* 94 99    Lipid Profile: No results for input(s): CHOL, HDL, LDLCALC, TRIG, CHOLHDL, LDLDIRECT in the last 72 hours. Thyroid Function Tests: No results for input(s): TSH, T4TOTAL, FREET4, T3FREE, THYROIDAB in the last 72 hours. Anemia Panel: No results for input(s): VITAMINB12, FOLATE, FERRITIN, TIBC, IRON, RETICCTPCT in the last 72 hours. Sepsis Labs: No results for input(s): PROCALCITON, LATICACIDVEN in the last 168 hours.  Recent Results (from the past 240 hour(s))  Resp Panel by RT-PCR (Flu A&B, Covid) Nasopharyngeal Swab     Status: None   Collection Time: 07/18/21 10:11 AM   Specimen: Nasopharyngeal Swab;  Nasopharyngeal(NP) swabs in vial transport medium  Result Value Ref Range Status   SARS Coronavirus 2 by RT PCR NEGATIVE NEGATIVE Final    Comment: (NOTE) SARS-CoV-2 target nucleic acids are NOT DETECTED.  The SARS-CoV-2 RNA is generally detectable in upper respiratory specimens during the acute phase of infection. The lowest concentration of SARS-CoV-2 viral copies this assay can detect is 138 copies/mL. A negative result does not preclude SARS-Cov-2 infection and should not be used as the sole basis for treatment or other patient management decisions. A negative result may occur with  improper specimen collection/handling, submission of specimen other than nasopharyngeal swab, presence of viral mutation(s) within the areas targeted by this assay, and inadequate number of viral copies(<138 copies/mL). A negative result must be combined with clinical observations, patient history, and epidemiological information. The expected result is Negative.  Fact Sheet for Patients:  EntrepreneurPulse.com.au  Fact Sheet for Healthcare Providers:  IncredibleEmployment.be  This test is no t yet approved or cleared by the [place] FDA and  has been authorized for detection and/or diagnosis of SARS-CoV-2 by FDA under an Emergency Use Authorization (EUA). This EUA will remain  in effect (meaning this test can be used) for the duration of the COVID-19 declaration under Section 564(b)(1) of the Act,  21 U.S.C.section 360bbb-3(b)(1), unless the authorization is terminated  or revoked sooner.       Influenza A by PCR NEGATIVE NEGATIVE Final   Influenza B by PCR NEGATIVE NEGATIVE Final    Comment: (NOTE) The Xpert Xpress SARS-CoV-2/FLU/RSV plus assay is intended as an aid in the diagnosis of influenza from Nasopharyngeal swab specimens and should not be used as a sole basis for treatment. Nasal washings and aspirates are unacceptable for Xpert Xpress  SARS-CoV-2/FLU/RSV testing.  Fact Sheet for Patients: EntrepreneurPulse.com.au  Fact Sheet for Healthcare Providers: IncredibleEmployment.be  This test is not yet approved or cleared by the [place] FDA and has been authorized for detection and/or diagnosis of SARS-CoV-2 by FDA under an Emergency Use Authorization (EUA). This EUA will remain in effect (meaning this test can be used) for the duration of the COVID-19 declaration under Section 564(b)(1) of the Act, 21 U.S.C. section 360bbb-3(b)(1), unless the authorization is terminated or revoked.  Performed at [facility], [place] [address], [place], [place] [address]        Radiology Studies: No results found.   LOS: 82 days   Antonieta Pert, MD [facility]  07/22/2021, 5:06 PM

## 2021-07-22 NOTE — Progress Notes (Signed)
[facility] PROGRESS NOTE  Amy [name] [id number] DOB: 01/09/1946 DOA: 05/19/2021 PCP: [name], MD  Status:  Remains inpatient appropriate because:Unsafe d/c plan, IV treatments appropriate due to intensity of illness or inability to take PO, and Inpatient level of care appropriate due to severity of illness  Dispo: The patient is from: Home              Anticipated d/c is to: SNF              Patient currently is medically stable to d/c.   Difficult to place patient Yes              Barriers to discharge: Dialysis patient with markedly decreased mobility-we will need to prove tolerance of sitting in chair for hemodialysis while here as well as tolerating sitting in wheelchair for up to 1 hour x 2 to replicate transport process.  Cannot discharge and proceed with outpatient hemodialysis unless this is accomplished.    Level of care: Med-Surg  Code Status: DNR Family communication: Spoke with patient's son on 9/27.  Updated him on surgical team reluctance to proceed with open G-tube given no underlying evidence of dysphagia and despite patient lacking capacity she continues to refuse the G-tube. DVT prophylaxis: Eliquis currently on hold secondary to need for placement of [place] vaccination status: [name] 02/26/2020, Pfizer 04/10/2021    HPI: 75 y.o. 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 Patient presented to the ED on 7/25 with dark stool.  On presentation, hemoglobin was 9.6; GI was consulted.  She underwent EGD which was normal and flexible sigmoidoscopy which showed nonbleeding distal rectal ulcers/stercoral ulcers.  Subsequently, GI signed  off.  Neurology was also consulted for altered mental status; MRI brain was similar to prior MRI at [facility] with a large right MCA stroke.  EEG this admission unremarkable. Nephrology has been following for dialysis needs. PEG tube was desired by family but could not be placed because of overlying transverse colon.  General surgery was consulted for G-tube placement but patient does not want a G-tube and  was deemed to have capacity by psych team.  Subsequently after further more detailed evaluations and observation it was finally clarified that she lacked capacity.  Surgical team was consulted the fact that the patient lacks underlying dysphagia and even though she lacks capacity continues to express a desire to not have a gastrostomy tube placed they are reluctant to proceed with surgery and have ordered follow-up SLP evaluation regarding dysphagia.  Subjective: Patient sleeping during dialysis in chair.  Denies abdominal discomfort and continues to request no G-tube when asked.  Objective: Vitals:   07/22/21 0736 07/22/21 0741  BP: (!) 167/94 (!) 159/107  Pulse: 66 88  Resp: 16   Temp: 98.3 F (36.8 C)   SpO2: 100%     Intake/Output Summary (Last 24 hours) at 07/22/2021 0807 Last data filed at 07/22/2021 0413 Gross per 24 hour  Intake 426.43 ml  Output -5 ml  Net 431.43 ml   Filed Weights   07/17/21 0907 07/17/21 1244 07/17/21 2100  Weight: 82.1 kg 80 kg 97.5 kg    Exam:  Constitutional: NAD, calm, sleeping during dialysis but easily awakened Respiratory: Lung sounds CTA,  RA, no increased work of breathing Cardiovascular: S1-S2, no peripheral edema, no JVD, skin warm and dry Abdomen: Soft and nondistended with normoactive bowel sounds.[name] LBM 9/26 Neurologic: CN 2-12 grossly intact -Strength 1/5 on the right and thoroughly tested noting patient unable to grab fork or individual food items and feed self. 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 Surgery has reevaluated the patient and have concerns regarding placement of a feeding tube in a patient who does not have dysphagia and is verbal and able to states she does not want the tube despite the fact that she lacks capacity- they have asked for a repeat SLP evaluation regarding dysphagia.  Acute delirium Continue olanzapine at bedtime to help with appetite and nocturnal delirium symptoms  ESRD on HD Continue HD MWF  Hyperkalemia/hypophosphatemia Management per nephrology HD -supplementation as needed   Hypotension Blood pressure currently is stable on midodrine.  Constipation Patient was on chronic narcotics prior to admission and these remain in place Continue Chronulac daily  Type 2 diabetes mellitus Episodes of hypoglycemia Hemoglobin A1c 5.1.  Diet controlled at baseline  Chronic thrombus left upper extremity 9/23 Eliquis held and pharmacy managing IV heparin to bridge in anticipation of upcoming surgery  Stage II mid coccyx pressure injury, present on admission Continue wound care    GERD Continue Protonix and Pepcid     Data Reviewed: Basic Metabolic Panel: Recent Labs  Lab 07/15/21 0812 07/17/21 0930 07/19/21 0421 07/19/21 2109 07/20/21 0708 07/20/21 1259  NA 130* 128* 133*  --  130* 131*  K 6.5* 6.3* 6.0* 6.0* 5.9* 5.8*  CL 93* 90* 96*  --  92* 93*  CO2 '29 30 29  '$ --  28  28  GLUCOSE 137* 147* 80  --  170* 112*  BUN 39* 39* 36*  --  43* 42*  CREATININE 3.51* 3.18* 3.26*  --  3.92* 4.06*  CALCIUM 8.4* 8.3* 8.5*  --  8.5* 8.5*  PHOS 2.4* 2.7 3.0  --  4.1  --    Liver Function Tests: Recent Labs  Lab 07/15/21 0812 07/17/21 0930 07/19/21 0421 07/20/21 0708  ALBUMIN 1.8* 1.9* 1.8* 1.7*   No results for input(s): LIPASE, AMYLASE in the last 168 hours. No results for input(s): AMMONIA in the last 168 hours. CBC: Recent Labs  Lab 07/19/21 0421 07/19/21 1450 07/20/21 0708 07/21/21 0823 07/22/21 0312  WBC 14.4* 14.7* 12.3* 9.4 9.5  HGB 8.0* 8.5* 8.1* 8.3* 8.2*  HCT 27.4* 28.7* 26.5* 27.2* 27.2*  MCV 98.2 98.3 96.4 94.8 94.1  PLT 248 265 306 363 354    CBG: Recent Labs  Lab 07/21/21 1143 07/21/21 1620 07/21/21 2103 07/22/21 0628 07/22/21 0657  GLUCAP 101* 85 77 64* 139*     Scheduled Meds:  (feeding supplement) PROSource Plus  30 mL Oral BID BM   atorvastatin  40 mg Oral Daily   chlorhexidine  15 mL Mouth Rinse BID   Chlorhexidine Gluconate Cloth  6 each Topical Daily  darbepoetin (ARANESP) injection - DIALYSIS  200 mcg Intravenous Q Thu-HD   feeding supplement  237 mL Oral BID BM   gabapentin  100 mg Oral Q12H   insulin aspart  0-6 Units Subcutaneous TID WC   lactulose  20 g Oral Daily   mouth rinse  15 mL Mouth Rinse q12n4p   midodrine  10 mg Oral TID WC   mirtazapine  7.5 mg Oral QHS   multivitamin  1 tablet Oral QHS   OLANZapine zydis  2.5 mg Oral QHS   pantoprazole sodium  40 mg Oral Daily   sodium chloride flush  10-40 mL Intracatheter Q12H   thiamine  100 mg Oral Daily   venlafaxine  37.5 mg Oral BID   Continuous Infusions:  dextrose 40 mL/hr at 07/21/21 1840   feeding supplement (OSMOLITE 1.5 CAL)     heparin 750 Units/hr (07/21/21 2142)   iron sucrose Stopped (07/17/21 1500)    Principal Problem:   GI bleed Active Problems:   Essential hypertension   Type 2 diabetes mellitus with chronic kidney disease on  chronic dialysis, with long-term current use of insulin (HCC)   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   ESRD on dialysis ([place])   Dyslipidemia   Hypokalemia   Stage 2 skin ulcer of sacral region [facility])   Cerebral thrombosis with cerebral infarction   Protein-calorie malnutrition, severe   Acute GI bleeding   Oropharyngeal dysphagia   Failure to thrive in adult   Inadequate oral intake   Clogged feeding tube   Fever   Rectal bleeding   Delirium due to another medical condition   Consultants: Vascular surgery Nephrology Gastroenterology Neurology Palliative medicine General surgery  Procedures: EEG Echocardiogram Cortrack EGD  Antibiotics: Ceftriaxone 8/3 through 8/5   Time spent: 25 minutes    Erin Hearing ANP  [facility] 7 am - 330 pm/M-F for direct patient care and secure chat Please refer to Amion for contact info 63  days

## 2021-07-22 NOTE — Progress Notes (Signed)
Hypoglycemic Event  CBG: 64 mg/dL @ 0629  Treatment: D50 25 mL (12.5 gm)  Symptoms: None  Follow-up CBG: Time:0658 CBG Result:'139mg'$ /dL  Possible Reasons for Event: Inadequate meal intake  Comments/MD notified:no, protocol initia    Babs Sciara

## 2021-07-23 DIAGNOSIS — R627 Adult failure to thrive: Secondary | ICD-10-CM | POA: Diagnosis not present

## 2021-07-23 DIAGNOSIS — K625 Hemorrhage of anus and rectum: Secondary | ICD-10-CM | POA: Diagnosis not present

## 2021-07-23 DIAGNOSIS — E43 Unspecified severe protein-calorie malnutrition: Secondary | ICD-10-CM | POA: Diagnosis not present

## 2021-07-23 DIAGNOSIS — I6330 Cerebral infarction due to thrombosis of unspecified cerebral artery: Secondary | ICD-10-CM | POA: Diagnosis not present

## 2021-07-23 LAB — BASIC METABOLIC PANEL
Anion gap: 10 (ref 5–15)
BUN: 25 mg/dL — ABNORMAL HIGH (ref 8–23)
CO2: 29 mmol/L (ref 22–32)
Calcium: 8.4 mg/dL — ABNORMAL LOW (ref 8.9–10.3)
Chloride: 91 mmol/L — ABNORMAL LOW (ref 98–111)
Creatinine, Ser: 3.49 mg/dL — ABNORMAL HIGH (ref 0.44–1.00)
GFR, Estimated: 13 mL/min — ABNORMAL LOW (ref >60)
Glucose, Bld: 94 mg/dL (ref 70–99)
Potassium: 3.8 mmol/L (ref 3.5–5.1)
Sodium: 130 mmol/L — ABNORMAL LOW (ref 135–145)

## 2021-07-23 LAB — GLUCOSE, CAPILLARY
Glucose-Capillary: 96 mg/dL (ref 70–99)
Glucose-Capillary: 142 mg/dL — ABNORMAL HIGH (ref 70–99)

## 2021-07-23 LAB — HEPARIN LEVEL (UNFRACTIONATED)
Heparin Unfractionated: 0.21 IU/mL — ABNORMAL LOW (ref 0.30–0.70)
Heparin Unfractionated: 0.36 IU/mL (ref 0.30–0.70)

## 2021-07-23 LAB — CBC
HCT: 28.3 % — ABNORMAL LOW (ref 36.0–46.0)
Hemoglobin: 8.5 g/dL — ABNORMAL LOW (ref 12.0–15.0)
MCH: 28.4 pg (ref 26.0–34.0)
MCHC: 30 g/dL (ref 30.0–36.0)
MCV: 94.6 fL (ref 80.0–100.0)
Platelets: 278 10*3/uL (ref 150–400)
RBC: 2.99 MIL/uL — ABNORMAL LOW (ref 3.87–5.11)
RDW: 17.6 % — ABNORMAL HIGH (ref 11.5–15.5)
WBC: 8.8 10*3/uL (ref 4.0–10.5)
nRBC: 0 % (ref 0.0–0.2)

## 2021-07-23 LAB — PHOSPHORUS: Phosphorus: 3.9 mg/dL (ref 2.5–4.6)

## 2021-07-23 MED ORDER — DARBEPOETIN ALFA 200 MCG/0.4ML IJ SOSY
200.0000 ug | PREFILLED_SYRINGE | INTRAMUSCULAR | Status: DC
  Administered 2021-07-25 – 2021-08-01 (×2): 200 ug via INTRAVENOUS
  Filled 2021-07-23 (×2): qty 0.4

## 2021-07-23 MED ORDER — HEPARIN SODIUM (PORCINE) 1000 UNIT/ML IJ SOLN
INTRAMUSCULAR | Status: AC
  Filled 2021-07-23: qty 4

## 2021-07-23 NOTE — Progress Notes (Addendum)
[place] for Heparin Indication:  h/o CVA and chronic UE thrombus  Allergies  Allergen Reactions   Sensipar [Cinacalcet]     Per MAR   Statins     Per MAR    Patient Measurements: Height: '5\' 7"'$  (170.2 cm) Weight: 68.4 kg (150 lb 12.8 oz) IBW/kg (Calculated) : 61.6  NOTE: weight (TBW) has varied 100.7 kg to 66.7 kg, pre HD and post HD Most common weight ~ 100 kg (222 lbs).  Heparin Dosing Weight:  85 kg  Vital Signs: Temp: 98.4 F (36.9 C) (09/28 0504) BP: 172/86 (09/28 0504) Pulse Rate: 88 (09/28 0504)  Labs: Recent Labs    07/20/21 1259 07/21/21 0823 07/21/21 0823 07/21/21 1855 07/21/21 1908 07/22/21 0312 07/22/21 0805 07/23/21 0616  HGB  --  8.3*   < >  --   --  8.2*  --  8.5*  HCT  --  27.2*  --   --   --  27.2*  --  28.3*  PLT  --  363  --   --   --  354  --  278  APTT  --  >200*  --   --  50* 61*  --   --   HEPARINUNFRC  --  0.66   < > 0.36  --  0.35  --  0.21*  CREATININE 4.06*  --   --   --   --   --  4.32* 3.49*   < > = values in this interval not displayed.     Estimated Creatinine Clearance: 14 mL/min (A) (by C-G formula based on SCr of 3.49 mg/dL (H)).  Assessment: 75 y.o. female with hx CVA and chronic UE thrombus,  on apixaban prior to admission.  Patient with prolonged hospitalization and difficult disposition.  Family has elected to pursue PEG tube placement for ongoing nutritional needs.  Surgery consulted, see MD progress notes indicating surgery reluctant to place PEG.   Eliquis stopped due to potential for procedure  [facility] placement and PEG placement). Pharmacy consulted to bridge with  IV heparin pending surgical plans. Last dose Eliquis 9/23 at 0900.   Heparin levels began correlating with aPTT on 9/27 , thus no further aPTT's are needed.   Heparin level has been therapeutic for past 2 days, however today 9/28 level 0.21 dropped below goal on heparin drip 750 units/hr. No issues with Heparin infusion per  RN.  No bleeding reported.  Goal of Therapy:  Heparin level 0.3-0.7 units/ml Monitor platelets by anticoagulation protocol: Yes   Plan:  Increase Heparin to 850 units//hr Check 8h HL Daily HL and CBC Monitor for bleeding.  Follow up on plans for restarting PTA apixaban after procedures done.    Thank you for allowing pharmacy to be part of this patients care team. Nicole Cella, [place] Pharmacist [phone] Please check AMION for all [place] phone numbers After 10:00 PM, call [place] [phone]

## 2021-07-23 NOTE — Progress Notes (Signed)
[name] Progress Note   Subjective:  Seen in room. MS continues to wax/wane. Able to answer my all my questions this morning. For HD again today.  Objective Vitals:   07/22/21 1122 07/22/21 1123 07/22/21 2126 07/23/21 0504  BP:  (!) 132/93 (!) 147/91 (!) 172/86  Pulse:  83 93 88  Resp:  '20 16 15  '$ Temp:  98 F (36.7 C) 98.1 F (36.7 C) 98.4 F (36.9 C)  TempSrc:  Oral Oral   SpO2:  94% 100% 100%  Weight: 68.4 kg     Height:       Physical Exam General: Chronically ill woman, NAD. More aware today. Heart: RRR; 2/6 murmur Lungs: CTA anteroirly Abdomen: soft Extremities: 1+ thigh/hip edema and UE Dialysis Access: [facility]  Additional Objective Labs: Basic Metabolic Panel: Recent Labs  Lab 07/20/21 0708 07/20/21 1259 07/22/21 0805 07/23/21 0616  NA 130* 131* 121* 130*  K 5.9* 5.8* 4.0 3.8  CL 92* 93* 84* 91*  CO2 '28 28 26 29  '$ GLUCOSE 170* 112* 101* 94  BUN 43* 42* 42* 25*  CREATININE 3.92* 4.06* 4.32* 3.49*  CALCIUM 8.5* 8.5* 7.9* 8.4*  PHOS 4.1  --  >30.0* 3.9   Liver Function Tests: Recent Labs  Lab 07/19/21 0421 07/20/21 0708 07/22/21 0805  ALBUMIN 1.8* 1.7* 1.6*   CBC: Recent Labs  Lab 07/19/21 1450 07/20/21 0708 07/21/21 0823 07/22/21 0312 07/23/21 0616  WBC 14.7* 12.3* 9.4 9.5 8.8  HGB 8.5* 8.1* 8.3* 8.2* 8.5*  HCT 28.7* 26.5* 27.2* 27.2* 28.3*  MCV 98.3 96.4 94.8 94.1 94.6  PLT 265 306 363 354 278   Medications:  dextrose 40 mL/hr at 07/23/21 0803   heparin 750 Units/hr (07/23/21 0803)    (feeding supplement) PROSource Plus  30 mL Oral BID BM   atorvastatin  40 mg Oral Daily   chlorhexidine  15 mL Mouth Rinse BID   Chlorhexidine Gluconate Cloth  6 each Topical Daily   darbepoetin (ARANESP) injection - DIALYSIS  200 mcg Intravenous Q Thu-HD   feeding supplement  237 mL Oral BID BM   gabapentin  100 mg Oral Q12H   insulin aspart  0-6 Units Subcutaneous TID WC   lactulose  20 g Oral Daily   mouth rinse  15 mL Mouth Rinse  q12n4p   midodrine  10 mg Oral TID WC   mirtazapine  7.5 mg Oral QHS   multivitamin  1 tablet Oral QHS   OLANZapine zydis  2.5 mg Oral QHS   pantoprazole sodium  40 mg Oral Daily   sodium chloride flush  10-40 mL Intracatheter Q12H   thiamine  100 mg Oral Daily   venlafaxine  37.5 mg Oral BID    Dialysis Orders: AF MWF  3h 55mn 400/500    62.5kg    3K/2.5 Ca P2  AVG HeRO   Hep none - Mircera 50 q 2 wks  (7/13)  Venofer 50 q wk - Parsabiv '5mg'$  IV q HD   Assessment/Plan: AMS/FTT: Mental status waxes/wanes. Also uses restraints on/off which has caused her to be in bed occasionally for HD. Pt will have to be able to sit in a chair for 4-6 hrs without restraints to qualify for OP HD w/ our group.  If she continues to tolerate sitting in the chair for dialysis and cooperate, then will re-CLIP the patient as her OP HD unit will not take her back. HD Nurse Coordinator and Renal Navigator following, stated prior to re-clipping her,  we need to ensure patient nutritional status is stable. Ongoing discussions regarding feeding tube. Melena/Rectal bleeding - GI consulted. Hx rectal ulcers.  EGD 7/27 normal, flex sig with mucosal ulceration c/w sterocoral ulcers, non bleeding. Hgb 8s, stable. ESRD: TDC dysfunction Monday, so dialyzed Tues. Back to MWF schedule now - HD today. 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 AVG clotted 9/2, unable to be declotted d/t arm contracture. S/P conversion to tunneled HD catheter 9/7.  Hypertension/volume: BP high recently, on midodrine '10mg'$  TID with significant overload on exam.  Continue to maximize UF as tolerated, can give albumin with HD for BP support.   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 CoreTrac removed-patient taking medications by mouth-continue assisting and encouraging patient with meals. 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 Psych eval from 9/14 determined that patient has functional  mental capacity for medical decision-making. Psych re-consulted 9/23 to evaluate the patient again - at this point, patient does not have the capacity to make medical decisions. Ongoing discussions with patient's son continues. Anemia: Hgb 8.5. Continue Aranesp 231mg q week. Dispo: DNR/DNI. Poor prognosis. Palliative care has seen patient. Appreciate their involvement. Although patient is currently tolerating being in the chair for HD, we do not believe dialysis is adding to her quality of life. Currently, patient no longer has capacity to make medical decisions-please see #9. Ongoing discussions with multi-discipline team and patient's son. Plan for primary team to re-consult Surgery for Gtube placement per son's request.   KVeneta Penton PA-C 07/23/2021, 9:50 AM  [facility]

## 2021-07-23 NOTE — Progress Notes (Signed)
[facility] for Heparin Indication:  h/o CVA and chronic UE thrombus  Allergies  Allergen Reactions   Sensipar [Cinacalcet]     Per MAR   Statins     Per MAR    Patient Measurements: Height: '5\' 7"'$  (170.2 cm) Weight:  (unable to obtain) IBW/kg (Calculated) : 61.6  NOTE: weight (TBW) has varied 100.7 kg to 66.7 kg, pre HD and post HD Most common weight ~ 100 kg (222 lbs).  Heparin Dosing Weight:  85 kg  Vital Signs: Temp: 98 F (36.7 C) (09/28 1641) Temp Source: Axillary (09/28 1641) BP: 140/71 (09/28 1641) Pulse Rate: 105 (09/28 1641)  Labs: Recent Labs    07/21/21 0823 07/21/21 1855 07/21/21 1908 07/22/21 0312 07/22/21 0805 07/23/21 0616 07/23/21 1843  HGB 8.3*  --   --  8.2*  --  8.5*  --   HCT 27.2*  --   --  27.2*  --  28.3*  --   PLT 363  --   --  354  --  278  --   APTT >200*  --  50* 61*  --   --   --   HEPARINUNFRC 0.66   < >  --  0.35  --  0.21* 0.36  CREATININE  --   --   --   --  4.32* 3.49*  --    < > = values in this interval not displayed.     Estimated Creatinine Clearance: 14 mL/min (A) (by C-G formula based on SCr of 3.49 mg/dL (H)).  Assessment: 75 y.o. female with hx CVA and chronic UE thrombus,  on apixaban prior to admission.  Patient with prolonged hospitalization and difficult disposition.  Family has elected to pursue PEG tube placement for ongoing nutritional needs.  Surgery consulted, see MD progress notes indicating surgery reluctant to place PEG.   Eliquis stopped due to potential for procedure  [facility] placement and PEG placement). Pharmacy consulted to bridge with  IV heparin pending surgical plans. Last dose Eliquis 9/23 at 0900.   Heparin level 0.36 (on 850 units/hr) Drawn appropriately   Goal of Therapy:  Heparin level 0.3-0.7 units/ml Monitor platelets by anticoagulation protocol: Yes   Plan:  Continue Heparin 850 units/hr Daily HL and CBC Monitor for bleeding.  Follow up on plans for  restarting PTA apixaban after procedures done.    Donnald Garre, PharmD Clinical Pharmacist  Please check AMION for all [place] numbers After 10:00 PM, call [place] [phone]

## 2021-07-23 NOTE — Plan of Care (Signed)
  Problem: Nutrition: Goal: Adequate nutrition will be maintained Outcome: Progressing   Problem: Skin Integrity: Goal: Risk for impaired skin integrity will decrease Outcome: Progressing   

## 2021-07-23 NOTE — Procedures (Signed)
Patient seen on Hemodialysis. BP (!) 178/88 (BP Location: Right Leg)   Pulse 90   Temp (!) 97.5 F (36.4 C)   Resp 18   Ht '5\' 7"'$  (1.702 m)   Wt 68.4 kg   LMP  (LMP Unknown)   SpO2 100%   BMI 23.62 kg/m   QB 400, UF goal 2.5L Tolerating treatment without complaints at this time.  Elmarie Shiley MD [facility]. Office # [phone] Pager # [phone] 11:28 AM

## 2021-07-23 NOTE — Progress Notes (Signed)
SLP Cancellation Note  Patient Details Name: Amy [name] MRN: [id number] DOB: 23-Sep-1946   Cancelled treatment:       Reason Eval/Treat Not Completed: Patient at procedure or test/unavailable. Pt leaving room for HD.    Izekiel Flegel, Katherene Ponto 07/23/2021, 10:53 AM

## 2021-07-23 NOTE — Progress Notes (Addendum)
2:40pm: CSW spoke with patient's son who states that prior to being at [facility], the patient was hospitalized at [facility] for 35 days. Prior to hospitalization at [facility], the patient was residing with him here in [employer]. Amy [name] states patient owns a home in [place]. CSW and Amy [name] discussed Medicaid application process and averages for private paying at [facility] / personal care services.  11:50am: CSW attempted to reach patient's son Amy [name] without success - a voicemail was left requesting a return call.  Madilyn Fireman, MSW, LCSW Transitions of Care  Clinical Social Worker II [phone]

## 2021-07-23 NOTE — Progress Notes (Signed)
[facility] PROGRESS NOTE  Amy [name] [id number] DOB: Dec 10, 1945 DOA: 05/19/2021 PCP: [name], MD  Status:  Remains inpatient appropriate because:Unsafe d/c plan, IV treatments appropriate due to intensity of illness or inability to take PO, and Inpatient level of care appropriate due to severity of illness  Dispo: The patient is from: Home              Anticipated d/c is to: SNF              Patient currently is medically stable to d/c.   Difficult to place patient Yes              Barriers to discharge: Dialysis patient with markedly decreased mobility-we will need to prove tolerance of sitting in chair for hemodialysis while here as well as tolerating sitting in wheelchair for up to 1 hour x 2 to replicate transport process.  Cannot discharge and proceed with outpatient hemodialysis unless this is accomplished.    Level of care: Med-Surg  Code Status: DNR Family communication: Spoke with patient's son on 9/27.  Updated him on surgical team reluctance to proceed with open G-tube given no underlying evidence of dysphagia and despite patient lacking capacity she continues to refuse the G-tube. DVT prophylaxis: Eliquis currently on hold secondary to need for placement of [place] vaccination status: [name] 02/26/2020, Pfizer 04/10/2021    HPI: 75 y.o. 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 Patient presented to the ED on 7/25 with dark stool.  On presentation, hemoglobin was 9.6; GI was consulted.  She underwent EGD which was normal and flexible sigmoidoscopy which showed nonbleeding distal rectal ulcers/stercoral ulcers.  Subsequently, GI signed  off.  Neurology was also consulted for altered mental status; MRI brain was similar to prior MRI at [facility] with a large right MCA stroke.  EEG this admission unremarkable. Nephrology has been following for dialysis needs. PEG tube was desired by family but could not be placed because of overlying transverse colon.  General surgery was consulted for G-tube placement but patient does not want a G-tube and  was deemed to have capacity by psych team.  Subsequently after further more detailed evaluations and observation it was finally clarified that she lacked capacity.  Surgical team was consulted the fact that the patient lacks underlying dysphagia and even though she lacks capacity continues to express a desire to not have a gastrostomy tube placed they are reluctant to proceed with surgery and have ordered follow-up SLP evaluation regarding dysphagia.  Subjective: Patient pleasant and oriented to name and place.  Not to year today.  Still makes inappropriate statements.  Continues to report she does not want a feeding tube.  It is noted that she did eat her Magic cup which she called yogurt and said she was not finished eating.  Objective: Vitals:   07/22/21 2126 07/23/21 0504  BP: (!) 147/91 (!) 172/86  Pulse: 93 88  Resp: 16 15  Temp: 98.1 F (36.7 C) 98.4 F (36.9 C)  SpO2: 100% 100%    Intake/Output Summary (Last 24 hours) at 07/23/2021 0757 Last data filed at 07/23/2021 0600 Gross per 24 hour  Intake 770.13 ml  Output 2230 ml  Net -1459.87 ml  Filed Weights   07/22/21 1122  Weight: 68.4 kg    Exam:  Constitutional: NAD, calm Respiratory: Lung sounds are CTA, able on RA, no increased work of breathing Cardiovascular: Heart S1-S2, chronic upper extremity edema which is stable, no JVD, skin warm and dry Abdomen: Soft and nondistended with normoactive bowel sounds.[name] LBM 9/27 Neurologic: CN 2-12 grossly intact -Strength 1/5 on the right-unable to use her right arm or take food item in  place and mouth.  Movements are consistent with dysmetria.  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 Surgery has reevaluated the patient and have concerns regarding placement of a feeding tube in a patient who does not have dysphagia, able to states she does not want the tube despite the fact that she lacks capacity noting open G-tube placement and this debilitated patient on dialysis is very high risk SLP swallowing evaluation requested by surgical team is pending  Acute delirium Continue olanzapine at bedtime to help with appetite and nocturnal delirium symptoms  ESRD on HD Continue HD MWF  Hyperkalemia/hypophosphatemia Management per nephrology HD -supplementation as needed   Hypotension Blood pressure currently is stable on midodrine.  Constipation Patient was on chronic narcotics prior to admission and these remain in place Continue Chronulac daily  Type 2 diabetes mellitus Episodes of hypoglycemia Hemoglobin A1c 5.1.  Diet controlled at baseline  Chronic thrombus left upper extremity 9/23 Eliquis held and pharmacy managing IV heparin to bridge in anticipation of upcoming surgery  Stage II mid coccyx pressure injury, present on admission Continue wound care    GERD Continue Protonix and Pepcid     Data Reviewed: Basic Metabolic Panel: Recent Labs  Lab 07/17/21 0930  07/19/21 0421 07/19/21 2109 07/20/21 0708 07/20/21 1259 07/22/21 0805 07/23/21 0616  NA 128* 133*  --  130* 131* 121* 130*  K 6.3* 6.0* 6.0* 5.9* 5.8* 4.0 3.8  CL 90* 96*  --  92* 93* 84* 91*  CO2 30 29  --  '28 28 26 29  '$ GLUCOSE 147* 80  --  170* 112* 101* 94  BUN 39* 36*  --  43* 42* 42* 25*  CREATININE 3.18* 3.26*  --  3.92* 4.06* 4.32* 3.49*  CALCIUM 8.3* 8.5*  --  8.5* 8.5* 7.9* 8.4*  PHOS 2.7 3.0  --  4.1  --  >30.0*  --    Liver Function Tests: Recent Labs  Lab 07/17/21 0930 07/19/21 0421 07/20/21 0708 07/22/21 0805  ALBUMIN 1.9* 1.8* 1.7* 1.6*   No results for input(s): LIPASE, AMYLASE in the last 168 hours. No results for input(s): AMMONIA in the last 168 hours. CBC: Recent Labs  Lab 07/19/21 1450 07/20/21 0708 07/21/21 0823 07/22/21 0312 07/23/21 0616  WBC 14.7* 12.3* 9.4 9.5 8.8  HGB 8.5* 8.1* 8.3* 8.2* 8.5*  HCT 28.7* 26.5* 27.2* 27.2* 28.3*  MCV 98.3 96.4 94.8 94.1 94.6  PLT 265 306 363 354 278    CBG: Recent Labs  Lab 07/22/21 0628 07/22/21 0657 07/22/21 1121 07/22/21  1619 07/22/21 2123  GLUCAP 64* 139* 94 99 93     Scheduled Meds:  (feeding supplement) PROSource Plus  30 mL Oral BID BM   atorvastatin  40 mg Oral Daily   chlorhexidine  15 mL Mouth Rinse BID   Chlorhexidine Gluconate Cloth  6 each Topical Daily   darbepoetin (ARANESP) injection - DIALYSIS  200 mcg Intravenous Q Thu-HD   feeding supplement  237 mL Oral BID BM   gabapentin  100 mg Oral Q12H   insulin aspart  0-6 Units Subcutaneous TID WC   lactulose  20 g Oral Daily   mouth rinse  15 mL Mouth Rinse q12n4p   midodrine  10 mg Oral TID WC   mirtazapine  7.5 mg Oral QHS   multivitamin  1 tablet Oral QHS   OLANZapine zydis  2.5 mg Oral QHS   pantoprazole sodium  40 mg Oral Daily   sodium chloride flush  10-40 mL Intracatheter Q12H   thiamine  100 mg Oral Daily   venlafaxine  37.5 mg Oral BID   Continuous Infusions:  dextrose 40 mL/hr at 07/22/21 1732   heparin 750  Units/hr (07/21/21 2142)    Principal Problem:   GI bleed Active Problems:   Essential hypertension   Type 2 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, with long-term current use of insulin (HCC)   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   ESRD on dialysis ([place])   Dyslipidemia   Hypokalemia   Stage 2 skin ulcer of sacral region ([place])   Cerebral thrombosis with cerebral infarction   Protein-calorie malnutrition, severe   Acute GI bleeding   Oropharyngeal dysphagia   Failure to thrive in adult   Inadequate oral intake   Clogged feeding tube   Fever   Rectal bleeding   Delirium due to another medical condition   Consultants: Vascular surgery Nephrology Gastroenterology Neurology Palliative medicine General surgery  Procedures: EEG Echocardiogram Cortrack EGD  Antibiotics: Ceftriaxone 8/3 through 8/5   Time spent: 25 minutes    Erin Hearing ANP  [facility] 7 am - 330 pm/M-F for direct patient care and secure chat Please refer to Amion for contact info 64  days

## 2021-07-23 NOTE — Progress Notes (Signed)
Spoke to inpt HD staff who report pt has been able to tolerate HD in the chair for the past few treatments. Reached out to [facility] ([facility]) to see if they can re-evaluate pt's case for in-center treatment upon d/c. Will await their response.  Melven Sartorius Renal Navigator [phone]

## 2021-07-24 DIAGNOSIS — N186 End stage renal disease: Secondary | ICD-10-CM | POA: Diagnosis not present

## 2021-07-24 DIAGNOSIS — I6330 Cerebral infarction due to thrombosis of unspecified cerebral artery: Secondary | ICD-10-CM | POA: Diagnosis not present

## 2021-07-24 DIAGNOSIS — R627 Adult failure to thrive: Secondary | ICD-10-CM | POA: Diagnosis not present

## 2021-07-24 DIAGNOSIS — R1319 Other dysphagia: Secondary | ICD-10-CM | POA: Diagnosis present

## 2021-07-24 DIAGNOSIS — E43 Unspecified severe protein-calorie malnutrition: Secondary | ICD-10-CM | POA: Diagnosis not present

## 2021-07-24 DIAGNOSIS — F0280 Dementia in other diseases classified elsewhere without behavioral disturbance: Secondary | ICD-10-CM | POA: Diagnosis present

## 2021-07-24 LAB — CBC
HCT: 27.9 % — ABNORMAL LOW (ref 36.0–46.0)
Hemoglobin: 8.3 g/dL — ABNORMAL LOW (ref 12.0–15.0)
MCH: 28 pg (ref 26.0–34.0)
MCHC: 29.7 g/dL — ABNORMAL LOW (ref 30.0–36.0)
MCV: 94.3 fL (ref 80.0–100.0)
Platelets: 241 10*3/uL (ref 150–400)
RBC: 2.96 MIL/uL — ABNORMAL LOW (ref 3.87–5.11)
RDW: 17.3 % — ABNORMAL HIGH (ref 11.5–15.5)
WBC: 9.7 10*3/uL (ref 4.0–10.5)
nRBC: 0 % (ref 0.0–0.2)

## 2021-07-24 LAB — GLUCOSE, CAPILLARY
Glucose-Capillary: 132 mg/dL — ABNORMAL HIGH (ref 70–99)
Glucose-Capillary: 143 mg/dL — ABNORMAL HIGH (ref 70–99)
Glucose-Capillary: 121 mg/dL — ABNORMAL HIGH (ref 70–99)

## 2021-07-24 LAB — HEPARIN LEVEL (UNFRACTIONATED): Heparin Unfractionated: 0.33 IU/mL (ref 0.30–0.70)

## 2021-07-24 NOTE — Plan of Care (Signed)
  Problem: Clinical Measurements: Goal: Ability to maintain clinical measurements within normal limits will improve Outcome: Progressing   Problem: Nutrition: Goal: Adequate nutrition will be maintained Outcome: Progressing   

## 2021-07-24 NOTE — Progress Notes (Signed)
[facility] Progress Note   Subjective: Seen in room - very talkative this morning albeit confused. Denies CP or dyspnea.  Objective Vitals:   07/23/21 1411 07/23/21 1419 07/23/21 1641 07/23/21 2147  BP: 136/66 (!) 164/74 140/71 (!) 159/93  Pulse: (!) 113 (!) 109 (!) 105 95  Resp: '17 18 18 16  '$ Temp:   98 F (36.7 C) 98.4 F (36.9 C)  TempSrc:   Axillary Oral  SpO2:  95% 100% 100%  Weight:      Height:       Physical Exam General: Chronically ill woman, NAD. Awake/talkative today. Heart: RRR; 2/6 murmur Lungs: CTA anteroirly Abdomen: soft Extremities: 1+ thigh/hip edema and UE Dialysis Access: [facility]  Additional Objective Labs: Basic Metabolic Panel: Recent Labs  Lab 07/20/21 0708 07/20/21 1259 07/22/21 0805 07/23/21 0616  NA 130* 131* 121* 130*  K 5.9* 5.8* 4.0 3.8  CL 92* 93* 84* 91*  CO2 '28 28 26 29  '$ GLUCOSE 170* 112* 101* 94  BUN 43* 42* 42* 25*  CREATININE 3.92* 4.06* 4.32* 3.49*  CALCIUM 8.5* 8.5* 7.9* 8.4*  PHOS 4.1  --  >30.0* 3.9   Liver Function Tests: Recent Labs  Lab 07/19/21 0421 07/20/21 0708 07/22/21 0805  ALBUMIN 1.8* 1.7* 1.6*   CBC: Recent Labs  Lab 07/20/21 0708 07/21/21 0823 07/22/21 0312 07/23/21 0616 07/24/21 0437  WBC 12.3* 9.4 9.5 8.8 9.7  HGB 8.1* 8.3* 8.2* 8.5* 8.3*  HCT 26.5* 27.2* 27.2* 28.3* 27.9*  MCV 96.4 94.8 94.1 94.6 94.3  PLT 306 363 354 278 241   CBG: Recent Labs  Lab 07/22/21 1121 07/22/21 1619 07/22/21 2123 07/23/21 0645 07/23/21 1646  GLUCAP 94 99 93 96 142*   Medications:  dextrose 40 mL/hr at 07/24/21 0757   heparin 850 Units/hr (07/24/21 0757)    (feeding supplement) PROSource Plus  30 mL Oral BID BM   atorvastatin  40 mg Oral Daily   chlorhexidine  15 mL Mouth Rinse BID   Chlorhexidine Gluconate Cloth  6 each Topical Daily   [START ON 07/25/2021] darbepoetin (ARANESP) injection - DIALYSIS  200 mcg Intravenous Q Fri-HD   feeding supplement  237 mL Oral BID BM   gabapentin  100  mg Oral Q12H   insulin aspart  0-6 Units Subcutaneous TID WC   lactulose  20 g Oral Daily   mouth rinse  15 mL Mouth Rinse q12n4p   midodrine  10 mg Oral TID WC   mirtazapine  7.5 mg Oral QHS   multivitamin  1 tablet Oral QHS   OLANZapine zydis  2.5 mg Oral QHS   pantoprazole sodium  40 mg Oral Daily   sodium chloride flush  10-40 mL Intracatheter Q12H   thiamine  100 mg Oral Daily   venlafaxine  37.5 mg Oral BID    Dialysis Orders: [facility] MWF  3h 104mn 400/500    62.5kg    3K/2.5 Ca P2  AVG HeRO   Hep none - Mircera 50 q 2 wks  (7/13)  Venofer 50 q wk - Parsabiv '5mg'$  IV q HD   Assessment/Plan: AMS/FTT: Mental status waxes/wanes. Has been dialyzing in recliner consistently for past 2 weeks - renal SW has reached out to prior HD unit to see if they will accept her back.   Melena/Rectal bleeding - GI consulted. Hx rectal ulcers.  EGD 7/27 normal, flex sig with mucosal ulceration c/w sterocoral ulcers, non bleeding. Hgb 8s, stable. ESRD: TDC dysfunction Monday, so dialyzed Tues. Back  to MWF schedule - next HD tomorrow. 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 AVG clotted 9/2, unable to be declotted d/t arm contracture. S/P conversion to tunneled HD catheter 9/7.  Hypertension/volume: BP high recently, on midodrine '10mg'$  TID with significant overload on exam.  Continue to maximize UF as tolerated, can give albumin with HD for BP support.   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 CoreTrac removed-patient taking medications by mouth-continue assisting and encouraging patient with meals. 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 Psych eval from 9/14 determined that patient has functional mental capacity for medical decision-making. Psych re-consulted 9/23 to evaluate the patient again - at this point, patient does not have the capacity to make medical decisions. Ongoing discussions with patient's son continues. Anemia: Hgb 8.5. Continue  Aranesp 256mg q week. Dispo: DNR/DNI. Poor prognosis. Although patient is currently tolerating being in the chair for HD, we do not believe dialysis is adding to her quality of life. Appreciate palliative care involvement.  KVeneta Penton PA-C 07/24/2021, 8:48 AM  [employer]

## 2021-07-24 NOTE — Progress Notes (Signed)
Nutrition Follow-up  DOCUMENTATION CODES:  Severe malnutrition in context of chronic illness  INTERVENTION:  -d/c Prosource -Recommend family bring in food from outside hospital to feed pt -Continue Ensure Enlive po BID, each supplement provides 350 kcal and 20 grams of protein  -Continue Magic cup TID with meals, each supplement provides 290 kcal and 9 grams of protein   Monitor for results of [place] discussions. If placement remains within [place], continue TF via G-tube once placed and ready to use: -Osmolite 1.5 @ 68m/hr (12073md)  -4535mrosource TF daily Provides 1840 kcals, 86g protein, 914m13mee water  NUTRITION DIAGNOSIS:  Severe Malnutrition related to chronic illness (ESRD on HD, prior CVA) as evidenced by percent weight loss, severe muscle depletion, energy intake < or equal to 75% for > or equal to 1 month. -- ongoing  GOAL:  Patient will meet greater than or equal to 90% of their needs -- not met  MONITOR:  PO intake, Supplement acceptance, Diet advancement, Labs, Weight trends, Skin, I & O's  REASON FOR ASSESSMENT:  Consult Enteral/tube feeding initiation and management  ASSESSMENT:  75 y33female with PMH of ESRD on HD MWF, HTN, HLD, T2DM, CVA s/p TPA 02/2021, ?blood clot in UE, left hemiparesis from CVA who presented to ER for dark stool with + fecal occult.  8/17 - cortrak placed (gastric tip) 8/19 - cortrak replaced (gastric tip) 8/29 - cortrak replaced (gastric tip) 9/07 - s/p tunneled HD cath placement 9/12 - cortrak replaced (gastric tip confirmed via xray); diet advanced to regular 9/23 - cortrak removed  Pt is pleasant and oriented to name and place, though continues to make inappropriate statements. Pt continues to state that she does not want a feeding tube, but is still deemed incompetent to make decisions and her son has consistently wanted to pursue aggressive measures. NP discussed pt with son in depth again today and explained that pt likely has  dementia that has been undiagnosed PTA and, given the likelihood of this diagnosis, it is unlikely that pt will regain desire to eat. NP also reminded son that G-tube placement is high risk for this pt and that pt has continuously stated that she does not want the tube placed. Surgical team has been hesitant to place tube due to high risk nature of procedure. Initially, Surgery wanted to wait until pt could tolerate HD in chair which pt is now capable of doing. Surgery then requested SLP re-evaluation prior to placement of G-tube. SLP re-evaluated pt today and determined a regular diet with thin liquids is most appropriate as pt demonstrating cognitive based dysphagia resulting in refusal of PO, but does possess ability to safely swallow as no muscular weakness or aspiration was observed during BSE. Pt states she gets full quickly. Of note, pt often thinks she is somewhere else -- today, she believed she had just been at her sister's house and had eaten pinto beans. Pt expressed desire for family to feed her. Family has been asked to bring in foods pt enjoys and to feed pt during visits.   Discussion of transition to comfort care revisited by NP; son requesting more time to make decision. NP requested surgical team discuss case with son and, per NP, this is planned to occur Monday, October 3rd. Will monitor for results of GOC Imberycussions. Note, if family wishes to continue full scope of treatment, pt will need G-tube placement.    Per RN, pt has been receiving Magic Cup and seems to enjoy them. Pt  refusing Prosource. Ensure consumption inconsistent.   PO intake: 0-25% x last 8 recorded meals (3% average meal intake)  Medications: Scheduled Meds:  (feeding supplement) PROSource Plus  30 mL Oral BID BM   atorvastatin  40 mg Oral Daily   chlorhexidine  15 mL Mouth Rinse BID   Chlorhexidine Gluconate Cloth  6 each Topical Daily   [START ON 07/25/2021] darbepoetin (ARANESP) injection - DIALYSIS  200 mcg  Intravenous Q Fri-HD   feeding supplement  237 mL Oral BID BM   gabapentin  100 mg Oral Q12H   insulin aspart  0-6 Units Subcutaneous TID WC   lactulose  20 g Oral Daily   mouth rinse  15 mL Mouth Rinse q12n4p   midodrine  10 mg Oral TID WC   mirtazapine  7.5 mg Oral QHS   multivitamin  1 tablet Oral QHS   OLANZapine zydis  2.5 mg Oral QHS   pantoprazole sodium  40 mg Oral Daily   sodium chloride flush  10-40 mL Intracatheter Q12H   thiamine  100 mg Oral Daily   venlafaxine  37.5 mg Oral BID  Continuous Infusions:  dextrose 40 mL/hr at 07/24/21 1618   heparin 850 Units/hr (07/24/21 1618)   Labs: Recent Labs  Lab 07/20/21 0708 07/20/21 1259 07/22/21 0805 07/23/21 0616  NA 130* 131* 121* 130*  K 5.9* 5.8* 4.0 3.8  CL 92* 93* 84* 91*  CO2 28 28 26 29   BUN 43* 42* 42* 25*  CREATININE 3.92* 4.06* 4.32* 3.49*  CALCIUM 8.5* 8.5* 7.9* 8.4*  PHOS 4.1  --  >30.0* 3.9  GLUCOSE 170* 112* 101* 94  CBGs [id number] hours  Last HD 9/28, 2.5L net UF No UOP documented x24 hours I/O: +5.7L since admit  RN edema assessment notes mild pitting edema to BUE and non-pitting edema to BLE.   Diet Order:   Diet Order             Diet regular Room service appropriate? No; Fluid consistency: Thin  Diet effective now                  EDUCATION NEEDS:  Education needs have been addressed  Skin:  Skin Assessment: Skin Integrity Issues: Skin Integrity Issues:: Stage II Stage II: Pressure Injury on coccyx; Open wound on buttocks  Last BM:  9/27  Height:  Ht Readings from Last 1 Encounters:  05/20/21 5' 7"  (1.702 m)   Weight:  Wt Readings from Last 1 Encounters:  07/22/21 68.4 kg   BMI:  Body mass index is 23.62 kg/m.  Estimated Nutritional Needs:  Kcal:  1700-1900 Protein:  85-100 grams Fluid:  1L +UOP    Larkin Ina, MS, RD, LDN (she/her/hers) RD pager number and weekend/on-call pager number located in [place].

## 2021-07-24 NOTE — Progress Notes (Signed)
[facility] PROGRESS NOTE  Amy [name] [id number] DOB: 1946/09/04 DOA: 05/19/2021 PCP: [name], MD  Status:  Remains inpatient appropriate because:Unsafe d/c plan, IV treatments appropriate due to intensity of illness or inability to take PO, and Inpatient level of care appropriate due to severity of illness  Dispo: The patient is from: Home              Anticipated d/c is to: SNF              Patient currently is medically stable to d/c.   Difficult to place patient Yes              Barriers to discharge: Dialysis patient with markedly decreased mobility-we will need to prove tolerance of sitting in chair for hemodialysis while here as well as tolerating sitting in wheelchair for up to 1 hour x 2 to replicate transport process.  Cannot discharge and proceed with outpatient hemodialysis unless this is accomplished.    Level of care: Med-Surg  Code Status: DNR Family communication: Spoke with patient's son on 9/29. He is very conflicted given the etiology of her dysphagia.  It was explained to him that she likely has a dementia that had been undiagnosed prior to admission and given this diagnosis it is doubtful she would ever recover her appetite/desire to eat.  I reminded him that this is a high risk surgery for her and could potentially cause more harm than expected good.  I discussed with him that he needs to take into account that she does not want the tube and the tube is not going improve her quality of life.  As per previous discussions we talked about not feeding her and waiting for the inevitable to occur and then stopping dialysis and focusing on comfort.  He states he needs some time to think about whether or not it is appropriate to place a tube and wishes to discuss with other family members.  I told him that was more than appropriate.  I also updated the surgical team and asked them to speak with the patient's son regarding risks of surgery.  They plan to  speak with his son on Monday, October 3 DVT prophylaxis: Eliquis  COVID vaccination status: [name] 02/26/2020, Pfizer 04/10/2021    HPI: 75 y.o. 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 Patient presented to the ED on 7/25 with dark stool.  On presentation, hemoglobin was 9.6; GI was consulted.  She underwent EGD which was normal and flexible sigmoidoscopy which showed nonbleeding distal rectal ulcers/stercoral ulcers.  Subsequently, GI signed off.  Neurology was also consulted for altered mental status; MRI brain was similar to prior MRI at [facility] with a large right MCA stroke.  EEG this admission unremarkable. Nephrology has been following for dialysis needs. PEG tube was desired by family but could not be placed because of overlying transverse colon.  General surgery was consulted for G-tube placement but patient does not want a G-tube and  was deemed to have capacity by psych team.  Subsequently after further more detailed evaluations and observation it was finally clarified that she lacked capacity.  Surgical team was consulted the fact that the patient lacks underlying dysphagia and even though she lacks  capacity continues to express a desire to not have a gastrostomy tube placed they are reluctant to proceed with surgery and have ordered follow-up SLP evaluation regarding dysphagia.  Subjective: Alert and pleasant.  Aware she is in the hospital and not at home.  Subsequently when questioning about eating she states she is full and she just ate pinto beans and corn and she was at a family member's house.  Objective: Vitals:   07/23/21 1641 07/23/21 2147  BP: 140/71 (!) 159/93  Pulse: (!)  105 95  Resp: 18 16  Temp: 98 F (36.7 C) 98.4 F (36.9 C)  SpO2: 100% 100%    Intake/Output Summary (Last 24 hours) at 07/24/2021 0805 Last data filed at 07/24/2021 0757 Gross per 24 hour  Intake 1270.96 ml  Output 2500 ml  Net -1229.04 ml   Filed Weights   07/22/21 1122  Weight: 68.4 kg    Exam:  Constitutional: NAD, calm Respiratory: Lungs CTA, RA, no increased work of breathing Cardiovascular: S1-S2, chronic upper extremity edema which is stable,  skin warm and dry-both Abdomen: Soft and nondistended with normoactive bowel sounds.[name] LBM 9/27 Neurologic: CN 2-12 grossly intact -Strength 1/5 on the right-unable to use her right arm or take food item in place and mouth.  Movements are consistent with dysmetria.  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  Back this lady had dementia that was milder undetected prior to admission and given recent stroke and change in environment the dementia has worsened -Continue olanzapine at at bedtime  Failure to thrive /Generalized deconditioning  /very poor oral intake/ After multiple evaluations by the psychiatric team it was determined the patient lacked capacity to make medical or other life decisions. Surgery has reevaluated the patient and have concerns regarding placement of a feeding tube in a patient who does not have dysphagia, able to states she does not want the tube despite the fact that she lacks  capacity noting open G-tube placement and this debilitated patient on dialysis is very high risk SLP swallowing evaluation consistent with cognitive-based dysphagia resulting in anorexia, resistance and disgust with food.  ESRD on HD Continue HD MWF  Hyperkalemia/hypophosphatemia Management per nephrology HD -supplementation as needed   Hypotension Blood pressure currently is stable on midodrine.  Constipation Patient was on chronic narcotics prior to admission and these remain in place Continue Chronulac daily  Type 2 diabetes mellitus Episodes of hypoglycemia Hemoglobin A1c 5.1.  Diet controlled at baseline  Chronic thrombus left upper extremity Continue Eliquis  Stage II mid coccyx pressure injury, present on admission Continue wound care    GERD Continue Protonix and Pepcid     Data Reviewed: Basic Metabolic Panel: Recent Labs  Lab 07/17/21 0930 07/19/21 0421 07/19/21 2109 07/20/21 0708 07/20/21 1259 07/22/21 0805 07/23/21 0616  NA 128* 133*  --  130* 131* 121* 130*  K 6.3* 6.0* 6.0* 5.9* 5.8* 4.0 3.8  CL 90* 96*  --  92* 93* 84* 91*  CO2 30 29  --  '28 28 26 29  '$ GLUCOSE 147* 80  --  170* 112* 101* 94  BUN 39* 36*  --  43* 42* 42* 25*  CREATININE 3.18* 3.26*  --  3.92* 4.06* 4.32* 3.49*  CALCIUM 8.3* 8.5*  --  8.5* 8.5* 7.9* 8.4*  PHOS 2.7 3.0  --  4.1  --  >30.0* 3.9   Liver Function Tests: Recent Labs  Lab 07/17/21 0930 07/19/21 0421 07/20/21 0708 07/22/21 0805  ALBUMIN 1.9* 1.8* 1.7* 1.6*   No results for input(s): LIPASE, AMYLASE in the last 168 hours. No results for input(s): AMMONIA in the last 168 hours. CBC: Recent Labs  Lab 07/20/21 0708 07/21/21 0823 07/22/21 0312 07/23/21 0616 07/24/21 0437  WBC 12.3* 9.4 9.5 8.8 9.7  HGB 8.1* 8.3* 8.2* 8.5* 8.3*  HCT 26.5* 27.2* 27.2* 28.3* 27.9*  MCV 96.4 94.8 94.1 94.6 94.3  PLT 306 363 354 278 241    CBG: Recent Labs  Lab 07/22/21 1121 07/22/21 1619 07/22/21 2123 07/23/21 0645  07/23/21 1646  GLUCAP 94 99 93 96 142*     Scheduled Meds:  (feeding supplement) PROSource Plus  30 mL Oral BID BM   atorvastatin  40 mg Oral Daily   chlorhexidine  15 mL Mouth Rinse BID   Chlorhexidine Gluconate Cloth  6 each Topical Daily   [START ON 07/25/2021] darbepoetin (ARANESP) injection - DIALYSIS  200 mcg Intravenous Q Fri-HD   feeding supplement  237 mL Oral BID BM   gabapentin  100 mg Oral Q12H   insulin aspart  0-6 Units Subcutaneous TID WC   lactulose  20 g Oral Daily   mouth rinse  15 mL Mouth Rinse q12n4p   midodrine  10 mg Oral TID WC   mirtazapine  7.5 mg Oral QHS   multivitamin  1 tablet Oral QHS   OLANZapine zydis  2.5 mg Oral QHS   pantoprazole sodium  40 mg Oral Daily   sodium chloride flush  10-40 mL Intracatheter Q12H   thiamine  100 mg Oral Daily   venlafaxine  37.5 mg Oral BID   Continuous Infusions:  dextrose 40 mL/hr at 07/24/21 0757   heparin 850 Units/hr (07/24/21 0757)    Principal Problem:   GI bleed Active Problems:   Essential hypertension   Type 2 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, with long-term current use of insulin (HCC)   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   ESRD on dialysis ([place])   Dyslipidemia   Hypokalemia   Stage 2 skin ulcer of sacral region ([place])   Cerebral thrombosis with cerebral infarction   Protein-calorie malnutrition, severe   Acute GI bleeding   Oropharyngeal dysphagia   Failure to thrive in adult   Inadequate oral intake   Clogged feeding tube   Fever   Rectal bleeding   Delirium due to another medical condition   Consultants: Vascular surgery Nephrology Gastroenterology Neurology Palliative medicine General surgery  Procedures: EEG Echocardiogram Cortrack EGD  Antibiotics: Ceftriaxone 8/3 through 8/5   Time spent: 35 minutes    Erin Hearing ANP  [facility] 7 am - 330 pm/M-F for direct patient care and secure chat Please refer  to Amion for contact info 65  days

## 2021-07-24 NOTE — Progress Notes (Signed)
[place] for Heparin Indication:  h/o CVA and chronic UE thrombus  Allergies  Allergen Reactions   Sensipar [Cinacalcet]     Per MAR   Statins     Per MAR    Patient Measurements: Height: '5\' 7"'$  (170.2 cm) Weight:  (unable to obtain) IBW/kg (Calculated) : 61.6  NOTE: weight (TBW) has varied 100.7 kg to 66.7 kg, pre HD and post HD Most common weight ~ 100 kg (222 lbs).  Heparin Dosing Weight:  85 kg  Vital Signs: Temp: 97.7 F (36.5 C) (09/29 0912) Temp Source: Oral (09/29 0912) BP: 145/91 (09/29 0912) Pulse Rate: 106 (09/29 0912)  Labs: Recent Labs    07/21/21 1855 07/21/21 1908 07/22/21 0312 07/22/21 0805 07/23/21 0616 07/23/21 1843 07/24/21 0437  HGB   < >  --  8.2*  --  8.5*  --  8.3*  HCT  --   --  27.2*  --  28.3*  --  27.9*  PLT  --   --  354  --  278  --  241  APTT  --  50* 61*  --   --   --   --   HEPARINUNFRC  --   --  0.35  --  0.21* 0.36 0.33  CREATININE  --   --   --  4.32* 3.49*  --   --    < > = values in this interval not displayed.     Estimated Creatinine Clearance: 14 mL/min (A) (by C-G formula based on SCr of 3.49 mg/dL (H)).  Assessment: 75 y.o. female with hx CVA and chronic UE thrombus,  on apixaban prior to admission.  Patient with prolonged hospitalization and difficult disposition.  Family has elected to pursue PEG tube placement for ongoing nutritional needs.  Surgery consulted, see MD progress notes indicating surgery reluctant to place PEG.   Eliquis stopped due to potential for procedures  [facility] placement and PEG placement). Pharmacy consulted to bridge with  IV heparin pending surgical plans. Last dose Eliquis 9/23 at 0900.   Heparin level 0.33, remains therapeutic on heparin drip at 850 units/hr. No bleeding noted.  Pltc within normal,  Hgb low/stable , on aranesp weekly.    Goal of Therapy:  Heparin level 0.3-0.7 units/ml Monitor platelets by anticoagulation protocol: Yes   Plan:  Continue  IV Heparin 850 units/hr Daily HL and CBC Monitor for bleeding.  Follow up on plans for restarting PTA apixaban after procedures done.   Thank you for allowing pharmacy to be part of this patients care team.  Nicole Cella, [place] Pharmacist [phone] 07/24/2021 10:24 AM Please check AMION for all [place] numbers After 10:00 PM, call [place]

## 2021-07-24 NOTE — Progress Notes (Signed)
Patient refused to take crushed medications and other liquid form of med.

## 2021-07-24 NOTE — Progress Notes (Signed)
Contacted by medical director of pt's out-pt clinic. Advised that [facility] is willing to accept pt back as an out-pt HD pt. However, with snf placement pending, it is possible for pt to be placed at a more local clinic to snf if needed/appropriate. Will assist as needed.  Melven Sartorius Renal Navigator [phone]

## 2021-07-24 NOTE — Evaluation (Signed)
Clinical/Bedside Swallow Evaluation Patient Details  Name: Amy [name] MRN: [id number] Date of Birth: 1946/08/23  Today's Date: 07/24/2021 Time: SLP Start Time (ACUTE ONLY): 0845 SLP Stop Time (ACUTE ONLY): 0900 SLP Time Calculation (min) (ACUTE ONLY): 15 min  Past Medical History:  Past Medical History:  Diagnosis Date   Chronic kidney disease    dialysis M-W-F - dialysis cath located in right side of chest   Diabetes mellitus without complication ([place])    type 2 - no meds    HLD (hyperlipidemia)    diet controlled   Hypertension    no meds   Past Surgical History:  Past Surgical History:  Procedure Laterality Date   A/V FISTULAGRAM Left 01/10/2021   Procedure: A/V FISTULAGRAM;  Surgeon: Angelia Mould, MD;  Location: [place] [facility];  Service: Cardiovascular;  Laterality: Left;   A/V FISTULAGRAM N/A 01/27/2021   Procedure: A/V FISTULAGRAM;  Surgeon: Waynetta Sandy, MD;  Location: [place] [facility];  Service: Cardiovascular;  Laterality: N/A;   AV FISTULA PLACEMENT Left 09/24/2020   Procedure: LEFT UPPER ARM ARTERIOVENOUS GORTEX  GRAFT;  Surgeon: Elam Dutch, MD;  Location: [place];  Service: Vascular;  Laterality: Left;   BREAST CYST EXCISION Right 2011   cyst removed -neg   ESOPHAGOGASTRODUODENOSCOPY (EGD) WITH PROPOFOL N/A 05/21/2021   Procedure: ESOPHAGOGASTRODUODENOSCOPY (EGD) WITH PROPOFOL;  Surgeon: Jackquline Denmark, MD;  Location: [facility] ENDOSCOPY;  Service: Endoscopy;  Laterality: N/A;   EYE SURGERY Bilateral    cataracts   FLEXIBLE SIGMOIDOSCOPY N/A 05/21/2021   Procedure: FLEXIBLE SIGMOIDOSCOPY;  Surgeon: Jackquline Denmark, MD;  Location: [facility] ENDOSCOPY;  Service: Endoscopy;  Laterality: N/A;   INSERTION OF DIALYSIS CATHETER Right 07/2019   IR FLUORO GUIDE CV LINE LEFT  06/27/2021   IR FLUORO GUIDE CV LINE LEFT  07/02/2021   IR GASTROSTOMY TUBE MOD SED  05/29/2021   IR US GUIDE VASC ACCESS LEFT  06/27/2021   PERIPHERAL VASCULAR BALLOON ANGIOPLASTY  01/10/2021    Procedure: PERIPHERAL VASCULAR BALLOON ANGIOPLASTY;  Surgeon: Angelia Mould, MD;  Location: [place] [facility];  Service: Cardiovascular;;  left AV graft   TUBAL LIGATION     Ultrasound-guided cannulation left upper arm AV graft  01/27/2021   HPI:  Pt is a 75 y.o. female who presented to ER for dark stool with + fecal occult. EGD 7/27: normal without evidence of GI bleed. Palliative care consulted and code status has been changed to DNR. Per EMR, pt's family has noted "physical, fucntional and cognitive decliene over the past several months but dramatic decline in the past 35 days". IR was consulted for PEG placement; unfortunately unable to place on 8/4 due to overlying transverse colon.  There have been ongoing concerns regarding pts oral intake, refusing food and continuing to require Cortrak feeding. Pt has demonstrated cognitive impiarment and has refused PEG tube though it seems needed given plan of care. 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 Palliative care following.    Assessment / Plan / Recommendation  Clinical Impression  Pt demonstrates a cognitive based dysphagia resulting in anorexia, resistance and disgust with food. SLP force fed pt three bites of ice cream, one cracker and sips of ginger ale. No muscular weakness or aspiration identified. But pt actively resisted bites and sips and started to thrust boluses out of her mouth, a common finding in pts with progressive cognitive decline after Right hemisphere disorder  or advanced dementia. She reported "if you keep feeding me that im going to throw up. I just ate cornbread, pinto beans and collard greens at my sisters house this morning. I get full real quick."  She could not be reoriented with cues to her surroundings and insisted that she had definitely been at her sisters house. She also stated "You better watch out because I have roaches on my eyebrows" and "Aida Raider and Rodman Key were just in here talking." In discussing with staff pt can be encouraged to eat minimal quantities of soft sweet solids. She states she wants a chicken biscuit. Will keep on caseload briefly, order very desireable solids and see how pt responds. Pt also verbalizes a desire for her family to feed her. Will continue to encourage family to bring foods and feed pt during visits, though suspect pt will also resist even the most desireable foods given by family. SLP Visit Diagnosis: Dysphagia, unspecified (R13.10)    Aspiration Risk  Risk for inadequate nutrition/hydration    Diet Recommendation Regular;Thin liquid   Liquid Administration via: Cup;Straw Medication Administration: Crushed with puree Supervision: Staff to assist with self feeding    Other  Recommendations      Recommendations for follow up therapy are one component of a multi-disciplinary discharge planning process, led by the attending physician.  Recommendations may be updated based on patient status, additional functional criteria and insurance authorization.  Follow up Recommendations Skilled Nursing facility      Frequency and Duration min 2x/week  1 week       Prognosis        Swallow Study   General HPI: Pt is a 75 y.o. female who presented to ER for dark stool with + fecal occult. EGD 7/27: normal without evidence of GI bleed. Palliative care consulted and code status has been changed to DNR. Per EMR, pt's family has noted "physical, fucntional and cognitive decliene over the past several months but dramatic decline in the past 35 days". IR was consulted for PEG placement; unfortunately unable to place on 8/4 due to overlying transverse colon.  There have been ongoing concerns regarding pts oral intake, refusing food and continuing to require Cortrak feeding. Pt has demonstrated cognitive impiarment and has refused PEG tube though it seems needed given plan of care. PMH: ESRD on dialysis MWF, HTN,  HLD, CVA s/p TPA 02/2021, ?blood clot in UE, left hemiparesis from CVA. Palliative care following. Type of Study: Bedside Swallow Evaluation Previous Swallow Assessment: mutliple prior BSE Diet Prior to this Study: Regular;Thin liquids Temperature Spikes Noted: No Respiratory Status: Room air History of Recent Intubation: No Behavior/Cognition: Alert Oral Cavity Assessment: Within Functional Limits Oral Care Completed by SLP: No Oral Cavity - Dentition: Adequate natural dentition Vision: Functional for self-feeding Self-Feeding Abilities: Total assist Patient Positioning: Upright in bed Baseline Vocal Quality: Normal Volitional Cough: Cognitively unable to elicit Volitional Swallow: Unable to elicit    Oral/Motor/Sensory Function Overall Oral Motor/Sensory Function: Within functional limits   Ice Chips     Thin Liquid Thin Liquid: Impaired Other Comments: resistant to intake, early satiety    Nectar Thick Nectar Thick Liquid: Not tested   Honey Thick Honey Thick Liquid: Not tested   Puree Puree: Impaired Presentation: Spoon Other Comments: resistant to intake   Solid     Solid: Impaired Other Comments: resists intake      Thaer Miyoshi, Katherene Ponto 07/24/2021,9:12 AM

## 2021-07-25 DIAGNOSIS — R1319 Other dysphagia: Secondary | ICD-10-CM | POA: Diagnosis not present

## 2021-07-25 DIAGNOSIS — I6330 Cerebral infarction due to thrombosis of unspecified cerebral artery: Secondary | ICD-10-CM | POA: Diagnosis not present

## 2021-07-25 DIAGNOSIS — F0280 Dementia in other diseases classified elsewhere without behavioral disturbance: Secondary | ICD-10-CM | POA: Diagnosis not present

## 2021-07-25 DIAGNOSIS — R627 Adult failure to thrive: Secondary | ICD-10-CM | POA: Diagnosis not present

## 2021-07-25 LAB — CBC
HCT: 26.8 % — ABNORMAL LOW (ref 36.0–46.0)
Hemoglobin: 8 g/dL — ABNORMAL LOW (ref 12.0–15.0)
MCH: 28 pg (ref 26.0–34.0)
MCHC: 29.9 g/dL — ABNORMAL LOW (ref 30.0–36.0)
MCV: 93.7 fL (ref 80.0–100.0)
Platelets: 217 10*3/uL (ref 150–400)
RBC: 2.86 MIL/uL — ABNORMAL LOW (ref 3.87–5.11)
RDW: 16.9 % — ABNORMAL HIGH (ref 11.5–15.5)
WBC: 9.9 10*3/uL (ref 4.0–10.5)
nRBC: 0 % (ref 0.0–0.2)

## 2021-07-25 LAB — RENAL FUNCTION PANEL
Albumin: 2 g/dL — ABNORMAL LOW (ref 3.5–5.0)
Anion gap: 12 (ref 5–15)
BUN: 15 mg/dL (ref 8–23)
CO2: 26 mmol/L (ref 22–32)
Calcium: 8.4 mg/dL — ABNORMAL LOW (ref 8.9–10.3)
Chloride: 91 mmol/L — ABNORMAL LOW (ref 98–111)
Creatinine, Ser: 3.36 mg/dL — ABNORMAL HIGH (ref 0.44–1.00)
GFR, Estimated: 14 mL/min — ABNORMAL LOW (ref >60)
Glucose, Bld: 88 mg/dL (ref 70–99)
Phosphorus: 3.3 mg/dL (ref 2.5–4.6)
Potassium: 3.6 mmol/L (ref 3.5–5.1)
Sodium: 129 mmol/L — ABNORMAL LOW (ref 135–145)

## 2021-07-25 LAB — GLUCOSE, CAPILLARY
Glucose-Capillary: 83 mg/dL (ref 70–99)
Glucose-Capillary: 99 mg/dL (ref 70–99)
Glucose-Capillary: 132 mg/dL — ABNORMAL HIGH (ref 70–99)
Glucose-Capillary: 120 mg/dL — ABNORMAL HIGH (ref 70–99)

## 2021-07-25 LAB — HEPARIN LEVEL (UNFRACTIONATED): Heparin Unfractionated: 0.28 IU/mL — ABNORMAL LOW (ref 0.30–0.70)

## 2021-07-25 MED ORDER — INSULIN ASPART 100 UNIT/ML IJ SOLN
0.0000 [IU] | Freq: Three times a day (TID) | INTRAMUSCULAR | Status: DC
Start: 2021-07-25 — End: 2021-08-07
  Administered 2021-07-28 – 2021-08-01 (×4): 1 [IU] via SUBCUTANEOUS
  Administered 2021-08-02: 2 [IU] via SUBCUTANEOUS
  Administered 2021-08-02 – 2021-08-03 (×3): 1 [IU] via SUBCUTANEOUS

## 2021-07-25 MED ORDER — HEPARIN SODIUM (PORCINE) 1000 UNIT/ML IJ SOLN
3800.0000 [IU] | INTRAMUSCULAR | Status: DC | PRN
  Administered 2021-08-01 – 2021-09-04 (×7): 3800 [IU]
  Filled 2021-07-25 (×9): qty 4

## 2021-07-25 MED ORDER — ALTEPLASE 2 MG IJ SOLR
3.8000 mg | Freq: Once | INTRAMUSCULAR | Status: AC
  Administered 2021-07-25: 3.8 mg

## 2021-07-25 MED ORDER — ALTEPLASE 2 MG IJ SOLR
INTRAMUSCULAR | Status: AC
  Filled 2021-07-25: qty 2

## 2021-07-25 MED ORDER — HEPARIN SODIUM (PORCINE) 1000 UNIT/ML IJ SOLN
INTRAMUSCULAR | Status: AC
  Administered 2021-07-25: 3800 [IU]
  Filled 2021-07-25: qty 4

## 2021-07-25 NOTE — Procedures (Signed)
Patient seen on Hemodialysis while in recliner. BP (!) 104/46 (BP Location: Right Arm)   Pulse (!) 104   Temp 97.9 F (36.6 C) (Oral)   Resp (!) 22   Ht '5\' 7"'$  (1.702 m)   Wt 68.4 kg   LMP  (LMP Unknown)   SpO2 95%   BMI 23.62 kg/m   QB 300, UF goal 2L Tolerating treatment without complaints at this time- somnolent and awakens to conversation.   Elmarie Shiley MD [facility]. Office # [phone] Pager # [phone] 9:13 AM

## 2021-07-25 NOTE — Progress Notes (Signed)
SLP Cancellation Note  Patient Details Name: Amy [name] MRN: [id number] DOB: 1946-01-18   Cancelled treatment:       Reason Eval/Treat Not Completed: Patient at procedure or test/unavailable   Tamikka Pilger, Katherene Ponto 07/25/2021, 12:34 PM

## 2021-07-25 NOTE — Progress Notes (Signed)
   07/25/21 1235  Vitals  Temp 97.8 F (36.6 C)  Temp Source Oral  BP (!) 96/55  BP Location Right Wrist  BP Method Automatic  Patient Position (if appropriate) Lying  Pulse Rate 76  Pulse Rate Source Monitor  Resp 17  Oxygen Therapy  SpO2 99 %  O2 Device Room Air  Hemodialysis Catheter Left Subclavian Double lumen Permanent (Tunneled)  Placement Date/Time: 07/02/21 1206   Time Out: Correct patient;Correct site;Correct procedure  Maximum sterile barrier precautions: Hand hygiene;Cap;Sterile gown;Mask;Sterile gloves;Large sterile sheet  Site Prep: Chlorhexidine (preferred);Skin Prep C...  Site Condition No complications  Blue Lumen Status Flushed;Heparin locked;Dead end cap in place  Red Lumen Status Flushed;Heparin locked;Dead end cap in place  Purple Lumen Status N/A  Catheter fill solution Heparin 1000 units/ml  Catheter fill volume (Arterial) 1.9 cc  Catheter fill volume (Venous) 1.9  Dressing Type Transparent  Dressing Status Clean;Dry;Intact  Antimicrobial disc in place? Yes  Interventions Dressing changed;Antimicrobial disc changed  Drainage Description None  Dressing Change Due 08/01/21  Post treatment catheter status Capped and Clamped  HD tx complete, pt stable. Pt restless halfway through tx. Given tylenol po and repositioned in chair throughout tx, minimal effects noted. HD catheter issues noted, with high arterial pressures noted. Dialysis delayed 30 minutes, cath locked with cathflo x 30 minutes and then tx restarted. Tx running without problems after intervention. Dr. Posey Pronto aware of the above.

## 2021-07-25 NOTE — Progress Notes (Signed)
[name] PROGRESS NOTE  [name] HKV:[id number] DOB: 1946/05/24 DOA: 05/19/2021 PCP: Amy Maw, MD  Status:  Remains inpatient appropriate because:Unsafe d/c plan, IV treatments appropriate due to intensity of illness or inability to take PO, and Inpatient level of care appropriate due to severity of illness  Dispo: The patient is from: Home              Anticipated d/c is to: SNF              Patient currently is medically stable to d/c.   Difficult to place patient Yes              Barriers to discharge: Dialysis patient with markedly decreased mobility-we will need to prove tolerance of sitting in chair for hemodialysis while here as well as tolerating sitting in wheelchair for up to 1 hour x 2 to replicate transport process.  Cannot discharge and proceed with outpatient hemodialysis unless this is accomplished.    Level of care: Med-Surg  Code Status: DNR Family communication: Spoke with patient's son on 9/29. He is very conflicted given the etiology of her dysphagia.  It was explained to him that she likely has a dementia that had been undiagnosed prior to admission and given this diagnosis it is doubtful she would ever recover her appetite/desire to eat.  I reminded him that this is a high risk surgery for her and could potentially cause more harm than expected good.  I discussed with him that he needs to take into account that she does not want the tube and the tube is not going improve her quality of life.  As per previous discussions we talked about not feeding her and waiting for the inevitable to occur and then stopping dialysis and focusing on comfort.  He states he needs some time to think about whether or not it is appropriate to place a tube and wishes to discuss with other family members.  I told him that was more than appropriate.  I also updated the surgical team and asked them to speak with the patient's son regarding risks of surgery.  They plan to  speak with his son on Monday, October 3 DVT prophylaxis: Heparin COVID vaccination status: [name] 02/26/2020, Pfizer 04/10/2021    HPI: 75 y.o. 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 Patient presented to the ED on 7/25 with dark stool.  On presentation, hemoglobin was 9.6; GI was consulted.  She underwent EGD which was normal and flexible sigmoidoscopy which showed nonbleeding distal rectal ulcers/stercoral ulcers.  Subsequently, GI signed off.  Neurology was also consulted for altered mental status; MRI brain was similar to prior MRI at [name] with a large right MCA stroke.  EEG this admission unremarkable. Nephrology has been following for dialysis needs. PEG tube was desired by family but could not be placed because of overlying transverse colon.  General surgery was consulted for G-tube placement but patient does not want a G-tube and  was deemed to have capacity by psych team.  Subsequently after further more detailed evaluations and observation it was finally clarified that she lacked capacity.  Surgical team was consulted the fact that the patient lacks underlying dysphagia and even though she lacks capacity  continues to express a desire to not have a gastrostomy tube placed they are reluctant to proceed with surgery and have ordered follow-up SLP evaluation regarding dysphagia.  Subjective: Examined while in dialysis.  Patient in chair and briefly awaken but does not attempt to interact or speak.  Objective: Vitals:   07/25/21 0750 07/25/21 0800  BP: (!) 172/74 (!) 170/106  Pulse: (!) 101 93  Resp: (!) 22   Temp: 97.9 F (36.6 C)   SpO2: 95%     Intake/Output Summary (Last 24  hours) at 07/25/2021 0816 Last data filed at 07/25/2021 0516 Gross per 24 hour  Intake 519.91 ml  Output 0 ml  Net 519.91 ml   Filed Weights   07/22/21 1122  Weight: 68.4 kg    Exam:  Constitutional: NAD, sleeping Respiratory: Lungs CTA, stable on room air, pulse oximetry 95 to 100% Cardiovascular: S1-S2, chronic upper extremity edema which is stable,  skin warm and dry-both Abdomen: Soft and nondistended with normoactive bowel sounds.[name] LBM 9/28-still not eating so she has started drinking very small amounts of liquids Neurologic: CN 2-12 grossly intact -Strength 1/5 on the right-unable to use her right arm or take food item in place and mouth.  Movements are consistent with dysmetria.  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  Surmise this lady had dementia that was undetected prior to admission and given recent stroke and change in environment the dementia has worsened -Continue olanzapine at at bedtime  Failure to thrive /Generalized deconditioning  /very poor oral intake/ After multiple evaluations by the psychiatric team it was determined the patient lacked capacity to make medical or other life decisions. Surgery has reevaluated the patient and have concerns regarding placement of a feeding tube in a patient who does not have dysphagia, able to states she does not want the tube despite the fact that she lacks capacity noting open G-tube  placement and this debilitated patient on dialysis is very high risk SLP swallowing evaluation consistent with cognitive-based dysphagia resulting in anorexia, resistance and disgust with food. Continue D10 infusion at 40 cc/h until decision made regarding feeding tube  ESRD on HD Continue HD MWF  Hyperkalemia/hypophosphatemia Management per nephrology HD -supplementation as needed   Hypotension Blood pressure currently is stable on midodrine.  Constipation Patient was on chronic narcotics prior to admission and these remain in place Continue Chronulac daily  Type 2 diabetes mellitus Episodes of hypoglycemia Hemoglobin A1c 5.1.  Diet controlled at baseline  Chronic thrombus left upper extremity Continue heparin  Stage II mid coccyx pressure injury, present on admission Continue wound care    GERD Continue Protonix and Pepcid     Data Reviewed: Basic Metabolic Panel: Recent Labs  Lab 07/19/21 0421 07/19/21 2109 07/20/21 0708 07/20/21 1259 07/22/21 0805 07/23/21 0616 07/25/21 0337  NA 133*  --  130* 131* 121* 130* 129*  K 6.0*   < > 5.9* 5.8* 4.0 3.8 3.6  CL 96*  --  92* 93* 84* 91* 91*  CO2 29  --  28 28 26  29 26  GLUCOSE 80  --  170* 112* 101* 94 88  BUN 36*  --  43* 42* 42* 25* 15  CREATININE 3.26*  --  3.92* 4.06* 4.32* 3.49* 3.36*  CALCIUM 8.5*  --  8.5* 8.5* 7.9* 8.4* 8.4*  PHOS 3.0  --  4.1  --  >30.0* 3.9 3.3   < > = values in this interval not displayed.   Liver Function Tests: Recent Labs  Lab 07/19/21 0421 07/20/21 0708 07/22/21 0805 07/25/21 0337  ALBUMIN 1.8* 1.7* 1.6* 2.0*   No results for input(s): LIPASE, AMYLASE in the last 168 hours. No results for input(s): AMMONIA in the last 168 hours. CBC: Recent Labs  Lab 07/21/21 0823 07/22/21 0312 07/23/21 0616 07/24/21 0437 07/25/21 0337  WBC 9.4 9.5 8.8 9.7 9.9  HGB 8.3* 8.2* 8.5* 8.3* 8.0*  HCT 27.2* 27.2* 28.3* 27.9* 26.8*  MCV 94.8 94.1 94.6 94.3 93.7  PLT 363 354 278 241 217     CBG: Recent Labs  Lab 07/23/21 1646 07/24/21 1130 07/24/21 1621 07/24/21 2105 07/25/21 0642  GLUCAP 142* 132* 143* 121* 83     Scheduled Meds:  atorvastatin  40 mg Oral Daily   chlorhexidine  15 mL Mouth Rinse BID   Chlorhexidine Gluconate Cloth  6 each Topical Daily   darbepoetin (ARANESP) injection - DIALYSIS  200 mcg Intravenous Q Fri-HD   feeding supplement  237 mL Oral BID BM   gabapentin  100 mg Oral Q12H   insulin aspart  0-6 Units Subcutaneous TID WC   lactulose  20 g Oral Daily   mouth rinse  15 mL Mouth Rinse q12n4p   midodrine  10 mg Oral TID WC   mirtazapine  7.5 mg Oral QHS   multivitamin  1 tablet Oral QHS   OLANZapine zydis  2.5 mg Oral QHS   pantoprazole sodium  40 mg Oral Daily   sodium chloride flush  10-40 mL Intracatheter Q12H   thiamine  100 mg Oral Daily   venlafaxine  37.5 mg Oral BID   Continuous Infusions:  dextrose 40 mL/hr at 07/24/21 1731   heparin 850 Units/hr (07/25/21 0814)    Principal Problem:   GI bleed Active Problems:   Essential hypertension   Type 2 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, with long-term current use of insulin (HCC)   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   ESRD on dialysis (HCC)   Dyslipidemia   Hypokalemia   Stage 2 skin ulcer of sacral region ([place])   Cerebral thrombosis with cerebral infarction   Protein-calorie malnutrition, severe   Acute GI bleeding   Oropharyngeal dysphagia   Failure to thrive in adult   Inadequate oral intake   Clogged feeding tube   Fever   Rectal bleeding   Delirium due to another medical condition   Neurogenic dysphagia   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

## 2021-07-25 NOTE — Progress Notes (Signed)
ANTICOAGULATION CONSULT NOTE - Follow Up Consult  Pharmacy Consult for Heparin Indication:  hx CVA an chronic UE thrombus  Allergies  Allergen Reactions   Sensipar [Cinacalcet]     Per MAR   Statins     Per [facility]    Patient Measurements: Height: '5\' 7"'$  (170.2 cm) Weight:  (UTA pt in chair) IBW/kg (Calculated) : 61.6 - recorded weights widely variable 66.7kg - 105.4 kg Heparin Dosing Weight: 85 kg  Vital Signs: Temp: 97.9 F (36.6 C) (09/30 0750) Temp Source: Oral (09/30 0750) BP: 122/54 (09/30 1130) Pulse Rate: 95 (09/30 1130)  Labs: Recent Labs    07/23/21 0616 07/23/21 1843 07/24/21 0437 07/25/21 0337  HGB 8.5*  --  8.3* 8.0*  HCT 28.3*  --  27.9* 26.8*  PLT 278  --  241 217  HEPARINUNFRC 0.21* 0.36 0.33 0.28*  CREATININE 3.49*  --   --  3.36*   ESRD  Assessment: 75 y.o. female with hx CVA and chronic UE thrombus,  on apixaban prior to admission.  Patient with prolonged hospitalization and difficult disposition.  Family has elected to pursue PEG tube placement for ongoing nutritional needs.  Surgery consulted, see MD progress notes indicating surgery reluctant to place PEG.   Eliquis stopped due to potential for procedure  [facility] placement and PEG placement). Pharmacy consulted to bridge with  IV heparin pending surgical plans. Last dose Eliquis 9/23 at 0900.    Heparin levels began correlating with aPTT on 9/27 , thus no further aPTT's are needed.    Heparin level 0.28 today on 850 units/hr, just below target range. Last 2 levels low therapeutic (0.33 and 0.36).  Goal of Therapy:  Heparin level 0.3-0.7 units/ml Monitor platelets by anticoagulation protocol: Yes   Plan:  Increase heparin drip to 900 units/hr. Continue daily heparin level and CBC. Eliquis on hold for possible procedures.  Arty Baumgartner, RPh 07/25/2021,12:02 PM

## 2021-07-26 DIAGNOSIS — I6330 Cerebral infarction due to thrombosis of unspecified cerebral artery: Secondary | ICD-10-CM | POA: Diagnosis not present

## 2021-07-26 DIAGNOSIS — T782XXA Anaphylactic shock, unspecified, initial encounter: Secondary | ICD-10-CM | POA: Diagnosis not present

## 2021-07-26 DIAGNOSIS — K922 Gastrointestinal hemorrhage, unspecified: Secondary | ICD-10-CM | POA: Diagnosis not present

## 2021-07-26 DIAGNOSIS — D638 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: Secondary | ICD-10-CM | POA: Diagnosis not present

## 2021-07-26 LAB — CBC
HCT: 29.8 % — ABNORMAL LOW (ref 36.0–46.0)
Hemoglobin: 9 g/dL — ABNORMAL LOW (ref 12.0–15.0)
MCH: 28.3 pg (ref 26.0–34.0)
MCHC: 30.2 g/dL (ref 30.0–36.0)
MCV: 93.7 fL (ref 80.0–100.0)
Platelets: 222 10*3/uL (ref 150–400)
RBC: 3.18 MIL/uL — ABNORMAL LOW (ref 3.87–5.11)
RDW: 17.2 % — ABNORMAL HIGH (ref 11.5–15.5)
WBC: 10.6 10*3/uL — ABNORMAL HIGH (ref 4.0–10.5)
nRBC: 0 % (ref 0.0–0.2)

## 2021-07-26 LAB — GLUCOSE, CAPILLARY
Glucose-Capillary: 109 mg/dL — ABNORMAL HIGH (ref 70–99)
Glucose-Capillary: 124 mg/dL — ABNORMAL HIGH (ref 70–99)
Glucose-Capillary: 114 mg/dL — ABNORMAL HIGH (ref 70–99)
Glucose-Capillary: 108 mg/dL — ABNORMAL HIGH (ref 70–99)

## 2021-07-26 LAB — HEPARIN LEVEL (UNFRACTIONATED): Heparin Unfractionated: 0.37 IU/mL (ref 0.30–0.70)

## 2021-07-26 MED ORDER — HYDRALAZINE HCL 20 MG/ML IJ SOLN: 10.0000 mg | INTRAMUSCULAR | Status: DC | PRN

## 2021-07-26 MED ORDER — SENNOSIDES-DOCUSATE SODIUM 8.6-50 MG PO TABS: 1.0000 | ORAL_TABLET | Freq: Every evening | ORAL | Status: DC | PRN

## 2021-07-26 MED ORDER — METOPROLOL TARTRATE 5 MG/5ML IV SOLN: 5.0000 mg | INTRAVENOUS | Status: DC | PRN

## 2021-07-26 NOTE — Progress Notes (Signed)
Subjective: Seen in room resting, minimal speech this morning, nods yes= tolerated dialysis yesterday  Objective Vital signs in last 24 hours: Vitals:   07/25/21 1323 07/25/21 1808 07/25/21 2108 07/26/21 0544  BP: 109/73 135/78 (!) 96/54 (!) 144/63  Pulse: 100 98 100 (!) 101  Resp: '18 19 17 17  '$ Temp: 98.6 F (37 C) 98.4 F (36.9 C) 98 F (36.7 C) 98.1 F (36.7 C)  TempSrc: Oral Oral  Axillary  SpO2: 100% 100% 95% 96%  Weight:      Height:       Weight change:   Physical Exam: General: Chronically ill obese elderly female NAD awake minimal speech interaction Heart: RRR 2/6 SEM no rub or gallop Lungs: CTA anteriorly nonlabored breathing Abdomen: Obese NABS, NTND Extremities: Trace hip edema and upper extremity, contracture bilateral upper extremity  dialysis Access: [facility]  Dialysis Orders: OP AF MWF  3h 64mn 400/500    62.5kg    3K/2.5 Ca P2  AVG HeRO   Hep none - Mircera 50 q 2 wks  (7/13)  Venofer 50 q wk - Parsabiv '5mg'$  IV q HD   Problem/Plan: AMS/FTT: Mental status that waxes/wanes.  HD recliner consistently for past 2 weeks - renal SW has reached out to prior HD unit to see if they will accept her back.   Melena/Rectal bleeding - GI consulted. Hx rectal ulcers.  EGD 7/27 normal, flex sig with mucosal ulceration c/w sterocoral ulcers, non bleeding. Hgb 8s, stable. ESRD: TDC dysfunction past Monday, so dialyzed Tues. yesterday back to MWF schedule  Access Issues/AVG clotted: Had f'gram  6/23 /22 which showed chronic thrombus and was started on Eliquis. AVG clotted 06/27/21, unable to be declotted d/t arm contracture. S/P conversion to tunneled HD catheter 07/02/21.  Hypertension/volume: BP high recently, on midodrine '10mg'$  TID with significant overload on exam.  Continue to maximize UF as tolerated, can give albumin with HD for BP support.   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 Hgb 9.0 Aranesp 200 MCG weekly MBD= Phos 3.3, calcium over 10 with low albumin, no vitamin D or phosphate binder  needed Nutrition: Severe PCM: Alb =2.0. CoreTrac removed-patient taking medications by mouth-continue assisting and encouraging patient with meals. 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 Psych eval from 9/14 determined that patient has functional mental capacity for medical decision-making. Psych re-consulted 9/23 to evaluate the patient again - at this point, patient does not have the capacity to make medical decisions. Ongoing discussions with patient's son continues. Dispo: DNR/DNI. Poor prognosis. Although patient is currently tolerating being in the chair for HD, we do not believe dialysis is adding to her quality of life. Appreciate palliative care involvement.  DErnest Haber PA-C [facility] [phone]/10/2020,10:05 AM  LOS: 67 days   Labs: Basic Metabolic Panel: Recent Labs  Lab 07/22/21 0805 07/23/21 0616 07/25/21 0337  NA 121* 130* 129*  K 4.0 3.8 3.6  CL 84* 91* 91*  CO2 '26 29 26  '$ GLUCOSE 101* 94 88  BUN 42* 25* 15  CREATININE 4.32* 3.49* 3.36*  CALCIUM 7.9* 8.4* 8.4*  PHOS >30.0* 3.9 3.3   Liver Function Tests: Recent Labs  Lab 07/20/21 0708 07/22/21 0805 07/25/21 0337  ALBUMIN 1.7* 1.6* 2.0*   No results for input(s): LIPASE, AMYLASE in the last 168 hours. No results for input(s): AMMONIA in the last 168 hours. CBC: Recent Labs  Lab 07/22/21 0312 07/23/21 0616 07/24/21 0437 07/25/21 0337 07/26/21 0320  WBC 9.5 8.8 9.7 9.9 10.6*  HGB 8.2* 8.5* 8.3* 8.0* 9.0*  HCT 27.2* 28.3* 27.9* 26.8* 29.8*  MCV 94.1 94.6 94.3 93.7 93.7  PLT 354 278 241 217 222   Cardiac Enzymes: No results for input(s): CKTOTAL, CKMB, CKMBINDEX, TROPONINI in the last 168 hours. CBG: Recent Labs  Lab 07/24/21 2105 07/25/21 0642 07/25/21 1333 07/25/21 1809 07/25/21 2131  GLUCAP 121* 83 99 132* 120*    Studies/Results: No results found. Medications:  dextrose 40 mL/hr at 07/25/21 1819   heparin 900 Units/hr (07/25/21 1334)     atorvastatin  40 mg Oral Daily   chlorhexidine  15 mL Mouth Rinse BID   Chlorhexidine Gluconate Cloth  6 each Topical Daily   darbepoetin (ARANESP) injection - DIALYSIS  200 mcg Intravenous Q Fri-HD   feeding supplement  237 mL Oral BID BM   gabapentin  100 mg Oral Q12H   insulin aspart  0-6 Units Subcutaneous TID AC & HS   lactulose  20 g Oral Daily   mouth rinse  15 mL Mouth Rinse q12n4p   midodrine  10 mg Oral TID WC   mirtazapine  7.5 mg Oral QHS   multivitamin  1 tablet Oral QHS   OLANZapine zydis  2.5 mg Oral QHS   pantoprazole sodium  40 mg Oral Daily   sodium chloride flush  10-40 mL Intracatheter Q12H   thiamine  100 mg Oral Daily   venlafaxine  37.5 mg Oral BID

## 2021-07-26 NOTE — Progress Notes (Signed)
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  Neurology was consulted due to change in mental status, MRI showed large right-sided MCA CVA same as prior.  Nephrology following for dialysis.  Family desired PEG tube but due to begin anatomy of placement it was difficult.  General surgery was following the team.   Assessment & Plan:   Principal Problem:   GI bleed Active Problems:   Essential hypertension   Type 2 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, with long-term current use of insulin (HCC)   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   ESRD on dialysis ([place])   Dyslipidemia   Hypokalemia   Stage 2 skin ulcer of sacral region ([place])   Cerebral thrombosis with cerebral infarction   Protein-calorie malnutrition, severe   Acute GI bleeding   Oropharyngeal dysphagia   Failure to thrive in adult   Inadequate oral intake   Clogged feeding tube   Fever   Rectal bleeding   Delirium due to another medical condition   Neurogenic dysphagia   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  General surgery is following.  It appears most of her dysphagia is related to cognition.  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  Continue to monitor.  This is no longer an issue since the time of her admission  Dementia - Occasional sundowning.  Continue olanzapine at bedtime  Diabetes mellitus type 2 - Hemoglobin A1c 5.1.  Diet controlled.  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  On heparin drip Code Status: DNR Family Communication:    Status is: Inpatient  Remains inpatient appropriate because:IV treatments appropriate due to intensity of illness or inability to take PO  Dispo: The patient is from: Home              Anticipated d/c is to: Home              Patient currently is not medically stable to d/c.  Inability to tolerate orals   Difficult to place patient No       Nutritional status  Nutrition Problem: Severe Malnutrition Etiology: chronic illness (ESRD on HD, prior CVA)  Signs/Symptoms: percent weight loss, severe muscle depletion, energy intake < or equal to 75% for > or equal to 1 month Percent weight loss: 16.4 %  Interventions: Nepro shake, Magic cup, MVI  Body mass index is 23.62 kg/m.  Pressure Injury 05/20/21 Coccyx Mid Stage 2 -  Partial thickness loss of dermis presenting as a shallow open injury with a red, pink wound bed without slough. 1X0.5X0 (Active)  05/20/21 1315  Location: Coccyx  Location Orientation: Mid  Staging: Stage 2 -  Partial thickness loss of dermis presenting as a shallow open injury with a red, pink wound bed without slough.  Wound Description (Comments): 1X0.5X0  Present on Admission: Yes          Subjective: Laying in the bed, no complaints.  Very poor oral intake Nods yes and no to basic questions   Examination:  Constitutional: Appears chronically ill.  Minimal verbal interaction Respiratory: Slight bibasilar  rhonchi Cardiovascular: Normal sinus rhythm, no rubs Abdomen: Nontender nondistended good bowel sounds Musculoskeletal: Somewhat contracted extremities with trace lower extremity edema Skin: No rashes seen Neurologic: CN 2-12 grossly intact.  And nonfocal Psychiatric: Overall her mood is depressed.  Alert to name and place.  Poor judgment and insight   Objective: Vitals:   07/25/21 1323 07/25/21 1808 07/25/21 2108 07/26/21 0544  BP: 109/73 135/78 (!) 96/54 (!) 144/63  Pulse: 100 98 100 (!) 101  Resp: '18 19 17 17  '$ Temp: 98.6 F (37 C) 98.4 F (36.9 C) 98 F (36.7 C) 98.1 F (36.7 C)  TempSrc: Oral Oral  Axillary  SpO2: 100% 100% 95% 96%  Weight:      Height:        Intake/Output Summary (Last 24 hours) at 07/26/2021 0726 Last data filed at 07/25/2021 1900 Gross per 24 hour  Intake --  Output 2 ml  Net -2 ml   Filed Weights   07/22/21 1122  Weight: 68.4 kg     Data Reviewed:   CBC: Recent Labs  Lab 07/22/21 0312 07/23/21 0616 07/24/21 0437 07/25/21 0337 07/26/21 0320  WBC 9.5 8.8 9.7 9.9 10.6*  HGB 8.2* 8.5* 8.3* 8.0* 9.0*  HCT 27.2* 28.3* 27.9* 26.8* 29.8*  MCV 94.1 94.6 94.3 93.7 93.7  PLT 354 278 241 217 [id number]   Basic Metabolic Panel: Recent Labs  Lab 07/20/21 0708 07/20/21 1259 07/22/21 0805 07/23/21 0616 07/25/21 0337  NA 130* 131* 121* 130* 129*  K 5.9* 5.8* 4.0 3.8 3.6  CL 92* 93* 84* 91* 91*  CO2 '28 28 26 29 26  '$ GLUCOSE 170* 112* 101* 94 88  BUN 43* 42* 42* 25* 15  CREATININE 3.92* 4.06* 4.32* 3.49* 3.36*  CALCIUM 8.5* 8.5* 7.9* 8.4* 8.4*  PHOS 4.1  --  >30.0* 3.9 3.3   GFR: Estimated Creatinine Clearance: 14.5 mL/min (A) (by C-G formula based on SCr of 3.36 mg/dL (H)). Liver Function Tests: Recent Labs  Lab 07/20/21 0708 07/22/21 0805 07/25/21 0337  ALBUMIN 1.7* 1.6* 2.0*   No results for input(s): LIPASE, AMYLASE in the last 168 hours. No results for input(s): AMMONIA in the last 168 hours. Coagulation Profile: No results for  input(s): INR, PROTIME in the last 168 hours. Cardiac Enzymes: No results for input(s): CKTOTAL, CKMB, CKMBINDEX, TROPONINI in the last 168 hours. BNP (last 3 results) No results for input(s): PROBNP in the last 8760 hours. HbA1C: No results for input(s): HGBA1C in the last 72 hours. CBG: Recent Labs  Lab 07/24/21 2105 07/25/21 0642 07/25/21 1333 07/25/21 1809 07/25/21 2131  GLUCAP 121* 83 99 132* 120*   Lipid Profile: No results for input(s): CHOL, HDL, LDLCALC, TRIG, CHOLHDL, LDLDIRECT in the last 72 hours. Thyroid Function Tests: No results for input(s): TSH, T4TOTAL, FREET4, T3FREE, THYROIDAB in the last 72 hours. Anemia Panel: No results for input(s): VITAMINB12, FOLATE, FERRITIN, TIBC, IRON, RETICCTPCT in the last 72 hours. Sepsis Labs: No results for input(s): PROCALCITON, LATICACIDVEN in the last 168 hours.  Recent Results (from the past  240 hour(s))  Resp Panel by RT-PCR (Flu A&B, Covid) Nasopharyngeal Swab     Status: None   Collection Time: 07/18/21 10:11 AM   Specimen: Nasopharyngeal Swab; Nasopharyngeal(NP) swabs in vial transport medium  Result Value Ref Range Status   SARS Coronavirus 2 by RT PCR NEGATIVE NEGATIVE Final    Comment: (NOTE) SARS-CoV-2 target nucleic acids are NOT DETECTED.  The SARS-CoV-2 RNA is generally detectable in upper respiratory specimens during the acute phase of infection. The lowest concentration of SARS-CoV-2 viral copies this assay can detect is 138 copies/mL. A negative result does not preclude SARS-Cov-2 infection and should not be used as the sole basis for treatment or other patient management decisions. A negative result may occur with  improper specimen collection/handling, submission of specimen other than nasopharyngeal swab, presence of viral mutation(s) within the areas targeted by this assay, and inadequate number of viral copies(<138 copies/mL). A negative result must be combined with clinical observations, patient  history, and epidemiological information. The expected result is Negative.  Fact Sheet for Patients:  EntrepreneurPulse.com.au  Fact Sheet for Healthcare Providers:  IncredibleEmployment.be  This test is no t yet approved or cleared by the [place] FDA and  has been authorized for detection and/or diagnosis of SARS-CoV-2 by FDA under an Emergency Use Authorization (EUA). This EUA will remain  in effect (meaning this test can be used) for the duration of the COVID-19 declaration under Section 564(b)(1) of the Act, 21 U.S.C.section 360bbb-3(b)(1), unless the authorization is terminated  or revoked sooner.       Influenza A by PCR NEGATIVE NEGATIVE Final   Influenza B by PCR NEGATIVE NEGATIVE Final    Comment: (NOTE) The Xpert Xpress SARS-CoV-2/FLU/RSV plus assay is intended as an aid in the diagnosis of influenza from Nasopharyngeal swab specimens and should not be used as a sole basis for treatment. Nasal washings and aspirates are unacceptable for Xpert Xpress SARS-CoV-2/FLU/RSV testing.  Fact Sheet for Patients: EntrepreneurPulse.com.au  Fact Sheet for Healthcare Providers: IncredibleEmployment.be  This test is not yet approved or cleared by the [place] FDA and has been authorized for detection and/or diagnosis of SARS-CoV-2 by FDA under an Emergency Use Authorization (EUA). This EUA will remain in effect (meaning this test can be used) for the duration of the COVID-19 declaration under Section 564(b)(1) of the Act, 21 U.S.C. section 360bbb-3(b)(1), unless the authorization is terminated or revoked.  Performed at [facility], [place] [address], [place], [place] [address]          Radiology Studies: No results found.      Scheduled Meds:  atorvastatin  40 mg Oral Daily   chlorhexidine  15 mL Mouth Rinse BID   Chlorhexidine Gluconate Cloth  6 each Topical Daily    darbepoetin (ARANESP) injection - DIALYSIS  200 mcg Intravenous Q Fri-HD   feeding supplement  237 mL Oral BID BM   gabapentin  100 mg Oral Q12H   insulin aspart  0-6 Units Subcutaneous TID AC & HS   lactulose  20 g Oral Daily   mouth rinse  15 mL Mouth Rinse q12n4p   midodrine  10 mg Oral TID WC   mirtazapine  7.5 mg Oral QHS   multivitamin  1 tablet Oral QHS   OLANZapine zydis  2.5 mg Oral QHS   pantoprazole sodium  40 mg Oral Daily   sodium chloride flush  10-40 mL Intracatheter Q12H   thiamine  100 mg Oral Daily   venlafaxine  37.5 mg Oral BID   Continuous Infusions:  dextrose 40 mL/hr at 07/25/21 1819   heparin 900 Units/hr (07/25/21 1334)     LOS: 67 days   Time spent= 35 mins    Armarion Greek Arsenio Loader, MD [facility]  If 7PM-7AM, please contact night-coverage  07/26/2021, 7:26 AM

## 2021-07-26 NOTE — Progress Notes (Signed)
ANTICOAGULATION CONSULT NOTE - Follow Up Consult  Pharmacy Consult for Heparin Indication:  hx CVA an chronic UE thrombus  Allergies  Allergen Reactions   Sensipar [Cinacalcet]     Per MAR   Statins     Per MAR    Patient Measurements: Height: '5\' 7"'$  (170.2 cm) Weight:  (Unable to obtain, pt in recliner) IBW/kg (Calculated) : 61.6 - recorded weights widely variable 66.7kg - 105.4 kg Heparin Dosing Weight: 85 kg  Vital Signs: Temp: 98.1 F (36.7 C) (10/01 0544) Temp Source: Axillary (10/01 0544) BP: 144/63 (10/01 0544) Pulse Rate: 101 (10/01 0544)  Labs: Recent Labs    07/24/21 0437 07/25/21 0337 07/26/21 0320  HGB 8.3* 8.0* 9.0*  HCT 27.9* 26.8* 29.8*  PLT 241 217 222  HEPARINUNFRC 0.33 0.28* 0.37  CREATININE  --  3.36*  --     ESRD  Assessment: 75 y.o. female with hx CVA and chronic UE thrombus,  on apixaban prior to admission.  Patient with prolonged hospitalization and difficult disposition.  Family has elected to pursue PEG tube placement for ongoing nutritional needs.  Surgery consulted, see MD progress notes indicating surgery reluctant to place PEG.   Eliquis stopped due to potential for procedure  [facility] placement and PEG placement). Pharmacy consulted to bridge with  IV heparin pending surgical plans. Last dose Eliquis 9/23 at 0900.    Heparin levels began correlating with aPTT on 9/27 , thus no further aPTT's are needed.    Heparin level was slightly low yesterday and rate increased from 850 to 900 units/hr. Therapeutic today at 0.37. CBC stable.   Goal of Therapy:  Heparin level 0.3-0.7 units/ml Monitor platelets by anticoagulation protocol: Yes   Plan:  Continue heparin drip at 900 units/hr. Daily heparin level and CBC. Eliquis on hold for possible procedures.  Arty Baumgartner, RPh 07/26/2021,12:39 PM

## 2021-07-27 DIAGNOSIS — K922 Gastrointestinal hemorrhage, unspecified: Secondary | ICD-10-CM | POA: Diagnosis not present

## 2021-07-27 DIAGNOSIS — I6330 Cerebral infarction due to thrombosis of unspecified cerebral artery: Secondary | ICD-10-CM | POA: Diagnosis not present

## 2021-07-27 DIAGNOSIS — D638 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: Secondary | ICD-10-CM | POA: Diagnosis not present

## 2021-07-27 DIAGNOSIS — T782XXA Anaphylactic shock, unspecified, initial encounter: Secondary | ICD-10-CM | POA: Diagnosis not present

## 2021-07-27 LAB — CBC
HCT: 25.9 % — ABNORMAL LOW (ref 36.0–46.0)
Hemoglobin: 7.7 g/dL — ABNORMAL LOW (ref 12.0–15.0)
MCH: 27.8 pg (ref 26.0–34.0)
MCHC: 29.7 g/dL — ABNORMAL LOW (ref 30.0–36.0)
MCV: 93.5 fL (ref 80.0–100.0)
Platelets: 234 10*3/uL (ref 150–400)
RBC: 2.77 MIL/uL — ABNORMAL LOW (ref 3.87–5.11)
RDW: 17.1 % — ABNORMAL HIGH (ref 11.5–15.5)
WBC: 12 10*3/uL — ABNORMAL HIGH (ref 4.0–10.5)
nRBC: 0 % (ref 0.0–0.2)

## 2021-07-27 LAB — GLUCOSE, CAPILLARY
Glucose-Capillary: 94 mg/dL (ref 70–99)
Glucose-Capillary: 99 mg/dL (ref 70–99)
Glucose-Capillary: 116 mg/dL — ABNORMAL HIGH (ref 70–99)
Glucose-Capillary: 97 mg/dL (ref 70–99)

## 2021-07-27 LAB — HEPARIN LEVEL (UNFRACTIONATED)
Heparin Unfractionated: 0.2 IU/mL — ABNORMAL LOW (ref 0.30–0.70)
Heparin Unfractionated: 0.24 IU/mL — ABNORMAL LOW (ref 0.30–0.70)
Heparin Unfractionated: 0.53 IU/mL (ref 0.30–0.70)

## 2021-07-27 NOTE — Progress Notes (Signed)
ANTICOAGULATION CONSULT NOTE - Follow Up Consult  Pharmacy Consult for Heparin Indication:  hx CVA an chronic UE thrombus  Allergies  Allergen Reactions   Sensipar [Cinacalcet]     Per MAR   Statins     Per MAR    Patient Measurements: Height: '5\' 7"'$  (170.2 cm) Weight:  (Unable to obtain, pt in recliner) IBW/kg (Calculated) : 61.6 - recorded weights widely variable 66.7kg - 105.4 kg Heparin Dosing Weight: 85 kg  Vital Signs: Temp: 98.4 F (36.9 C) (10/02 0916) Temp Source: Oral (10/02 0916) BP: 146/60 (10/02 0916) Pulse Rate: 98 (10/02 0916)  Labs: Recent Labs    07/25/21 0337 07/26/21 0320 07/27/21 0213 07/27/21 1034  HGB 8.0* 9.0* 7.7*  --   HCT 26.8* 29.8* 25.9*  --   PLT 217 222 234  --   HEPARINUNFRC 0.28* 0.37 0.20* 0.24*  CREATININE 3.36*  --   --   --     ESRD  Assessment: 75 y.o. female with hx CVA and chronic UE thrombus,  on apixaban prior to admission.  Patient with prolonged hospitalization and difficult disposition.  Family has elected to pursue PEG tube placement for ongoing nutritional needs.  Surgery consulted, see MD progress notes indicating surgery reluctant to place PEG.   Eliquis stopped due to potential for procedure  [facility] placement and PEG placement). Pharmacy consulted to bridge with  IV heparin pending surgical plans. Last dose Eliquis 9/23 at 0900.    Heparin levels began correlating with aPTT on 9/27 , thus no further aPTT's are needed.    Heparin level was slightly low on 9/30 and rate increased from 850 to 900 units/hr. Therapeutic yesterday at 0.37. Then low again early am (0.20) and infusion rate increased to 1050 units/hr. Subsequent level is still low (0.24). Hgb down to 7.7. Had been 8-8.5 the past week then 9.0 yesterday.  RN reports brown stool last pm.  Goal of Therapy:  Heparin level 0.3-0.7 units/ml Monitor platelets by anticoagulation protocol: Yes   Plan:  Increase heparin drip to 1150 units/hr. Daily heparin level and  CBC. Monitor for s/sx bleeding.  Eliquis on hold for possible procedures.  Arty Baumgartner, RPh 07/27/2021,12:58 PM

## 2021-07-27 NOTE — Progress Notes (Signed)
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  Neurology was consulted due to change in mental status, MRI showed large right-sided MCA CVA same as prior.  Nephrology following for dialysis.  Family desired PEG tube but due to begin anatomy of placement it was difficult.  General surgery was following the team.   Assessment & Plan:   Principal Problem:   GI bleed Active Problems:   Essential hypertension   Type 2 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, with long-term current use of insulin (HCC)   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   ESRD on dialysis ([place])   Dyslipidemia   Hypokalemia   Stage 2 skin ulcer of sacral region ([place])   Cerebral thrombosis with cerebral infarction   Protein-calorie malnutrition, severe   Acute GI bleeding   Oropharyngeal dysphagia   Failure to thrive in adult   Inadequate oral intake   Clogged feeding tube   Fever   Rectal bleeding   Delirium due to another medical condition   Neurogenic dysphagia   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 ([place])  Adult failure to thrive with generalized deconditioning severe protein calorie malnutrition - Ongoing discussions with family regarding feeding tube placement but has been very difficult due to anatomy.  General surgery is following.  Dysphagia appears to be cognition related.  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  Continue to monitor.  This is no longer an issue since the time of her admission  Dementia - Occasional sundowning.  Continue olanzapine at bedtime  Diabetes mellitus type 2 - Hemoglobin A1c 5.1.  Diet controlled.  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  On heparin drip Code Status: DNR Family Communication:    Status is: Inpatient  Remains inpatient appropriate because:IV treatments appropriate due to intensity of illness or inability to take PO  Dispo: The patient is from: Home              Anticipated d/c is to: Home              Patient currently is not medically stable to d/c.  Inability to tolerate orals   Difficult to place patient No       Nutritional status  Nutrition Problem: Severe Malnutrition Etiology: chronic illness (ESRD on HD, prior CVA)  Signs/Symptoms: percent weight loss, severe muscle depletion, energy intake < or equal to 75% for > or equal to 1 month Percent weight loss: 16.4 %  Interventions: Nepro shake, Magic cup, MVI  Body mass index is 23.62 kg/m.  Pressure Injury 05/20/21 Coccyx Mid Stage 2 -  Partial thickness loss of dermis presenting as a shallow open injury with a red, pink wound bed without slough. 1X0.5X0 (Active)  05/20/21 1315  Location: Coccyx  Location Orientation: Mid  Staging: Stage 2 -  Partial thickness loss of dermis presenting as a shallow open injury with a red, pink wound bed without slough.  Wound Description (Comments): 1X0.5X0  Present on Admission: Yes          Subjective: Laying in the bed, very poor oral intake over the last 24 hours  Examination: Constitutional: Patient appears chronically ill. Respiratory: Some bibasilar rhonchi Cardiovascular: Normal sinus rhythm, no  rubs Abdomen: Nontender nondistended good bowel sounds Musculoskeletal: Contracted extremities with trace lower extremity edema Skin: No rashes seen Neurologic: CN 2-12 grossly intact.  And nonfocal Psychiatric: Poor judgment and insight    Objective: Vitals:   07/26/21 0544 07/26/21 1704 07/26/21 2058 07/27/21 0645  BP: (!) 144/63 (!) 146/81 (!) 178/54 (!) 150/59  Pulse: (!) 101 92 99 (!) 103  Resp: '17 18 18 18  '$ Temp: 98.1 F (36.7 C) 98 F (36.7 C) 98.5 F (36.9 C) 98.6 F (37 C)  TempSrc: Axillary  Axillary Oral  SpO2: 96%  100% 98%  Weight:      Height:        Intake/Output Summary (Last 24 hours) at 07/27/2021 0906 Last data filed at 07/27/2021 0600 Gross per 24 hour  Intake 1297.17 ml  Output 0 ml  Net 1297.17 ml   Filed Weights   07/22/21 1122  Weight: 68.4 kg     Data Reviewed:   CBC: Recent Labs  Lab 07/23/21 0616 07/24/21 0437 07/25/21 0337 07/26/21 0320 07/27/21 0213  WBC 8.8 9.7 9.9 10.6* 12.0*  HGB 8.5* 8.3* 8.0* 9.0* 7.7*  HCT 28.3* 27.9* 26.8* 29.8* 25.9*  MCV 94.6 94.3 93.7 93.7 93.5  PLT 278 241 217 222 [id number]   Basic Metabolic Panel: Recent Labs  Lab 07/20/21 1259 07/22/21 0805 07/23/21 0616 07/25/21 0337  NA 131* 121* 130* 129*  K 5.8* 4.0 3.8 3.6  CL 93* 84* 91* 91*  CO2 '28 26 29 26  '$ GLUCOSE 112* 101* 94 88  BUN 42* 42* 25* 15  CREATININE 4.06* 4.32* 3.49* 3.36*  CALCIUM 8.5* 7.9* 8.4* 8.4*  PHOS  --  >30.0* 3.9 3.3   GFR: Estimated Creatinine Clearance: 14.5 mL/min (A) (by C-G formula based on SCr of 3.36 mg/dL (H)). Liver Function Tests: Recent Labs  Lab 07/22/21 0805 07/25/21 0337  ALBUMIN 1.6* 2.0*   No results for input(s): LIPASE, AMYLASE in the last 168 hours. No results for input(s): AMMONIA in the last 168 hours. Coagulation Profile: No results for input(s): INR, PROTIME in the last 168 hours. Cardiac Enzymes: No results for input(s): CKTOTAL, CKMB, CKMBINDEX, TROPONINI in the last 168 hours. BNP (last 3  results) No results for input(s): PROBNP in the last 8760 hours. HbA1C: No results for input(s): HGBA1C in the last 72 hours. CBG: Recent Labs  Lab 07/26/21 1007 07/26/21 1149 07/26/21 1616 07/26/21 2219 07/27/21 0737  GLUCAP 109* 124* 114* 108* 94   Lipid Profile: No results for input(s): CHOL, HDL, LDLCALC, TRIG, CHOLHDL, LDLDIRECT in the last 72 hours. Thyroid Function Tests: No results for input(s): TSH, T4TOTAL, FREET4, T3FREE, THYROIDAB in the last 72 hours. Anemia Panel: No results for input(s): VITAMINB12, FOLATE, FERRITIN, TIBC, IRON, RETICCTPCT in the last 72 hours. Sepsis Labs: No results for input(s): PROCALCITON, LATICACIDVEN in the last 168 hours.  Recent Results (from the past 240 hour(s))  Resp Panel by RT-PCR (Flu A&B, Covid) Nasopharyngeal Swab     Status: None   Collection Time: 07/18/21 10:11 AM   Specimen:  Nasopharyngeal Swab; Nasopharyngeal(NP) swabs in vial transport medium  Result Value Ref Range Status   SARS Coronavirus 2 by RT PCR NEGATIVE NEGATIVE Final    Comment: (NOTE) SARS-CoV-2 target nucleic acids are NOT DETECTED.  The SARS-CoV-2 RNA is generally detectable in upper respiratory specimens during the acute phase of infection. The lowest concentration of SARS-CoV-2 viral copies this assay can detect is 138 copies/mL. A negative result does not preclude SARS-Cov-2 infection and should not be used as the sole basis for treatment or other patient management decisions. A negative result may occur with  improper specimen collection/handling, submission of specimen other than nasopharyngeal swab, presence of viral mutation(s) within the areas targeted by this assay, and inadequate number of viral copies(<138 copies/mL). A negative result must be combined with clinical observations, patient history, and epidemiological information. The expected result is Negative.  Fact Sheet for Patients:  EntrepreneurPulse.com.au  Fact Sheet  for Healthcare Providers:  IncredibleEmployment.be  This test is no t yet approved or cleared by the [place] FDA and  has been authorized for detection and/or diagnosis of SARS-CoV-2 by FDA under an Emergency Use Authorization (EUA). This EUA will remain  in effect (meaning this test can be used) for the duration of the COVID-19 declaration under Section 564(b)(1) of the Act, 21 U.S.C.section 360bbb-3(b)(1), unless the authorization is terminated  or revoked sooner.       Influenza A by PCR NEGATIVE NEGATIVE Final   Influenza B by PCR NEGATIVE NEGATIVE Final    Comment: (NOTE) The Xpert Xpress SARS-CoV-2/FLU/RSV plus assay is intended as an aid in the diagnosis of influenza from Nasopharyngeal swab specimens and should not be used as a sole basis for treatment. Nasal washings and aspirates are unacceptable for Xpert Xpress SARS-CoV-2/FLU/RSV testing.  Fact Sheet for Patients: EntrepreneurPulse.com.au  Fact Sheet for Healthcare Providers: IncredibleEmployment.be  This test is not yet approved or cleared by the [place] FDA and has been authorized for detection and/or diagnosis of SARS-CoV-2 by FDA under an Emergency Use Authorization (EUA). This EUA will remain in effect (meaning this test can be used) for the duration of the COVID-19 declaration under Section 564(b)(1) of the Act, 21 U.S.C. section 360bbb-3(b)(1), unless the authorization is terminated or revoked.  Performed at [facility], [place] [address] [place], [place] [address]          Radiology Studies: No results found.      Scheduled Meds:  atorvastatin  40 mg Oral Daily   chlorhexidine  15 mL Mouth Rinse BID   Chlorhexidine Gluconate Cloth  6 each Topical Daily   darbepoetin (ARANESP) injection - DIALYSIS  200 mcg Intravenous Q Fri-HD   feeding supplement  237 mL Oral BID BM   gabapentin  100 mg Oral Q12H   insulin aspart  0-6  Units Subcutaneous TID AC & HS   lactulose  20 g Oral Daily   mouth rinse  15 mL Mouth Rinse q12n4p   midodrine  10 mg Oral TID WC   mirtazapine  7.5 mg Oral QHS   multivitamin  1 tablet Oral QHS   OLANZapine zydis  2.5 mg Oral QHS   pantoprazole sodium  40 mg Oral Daily   sodium chloride flush  10-40 mL Intracatheter Q12H   thiamine  100 mg Oral Daily   venlafaxine  37.5 mg Oral BID   Continuous Infusions:  dextrose 40 mL/hr at 07/26/21 1925   heparin 1,050 Units/hr (07/27/21 0303)     LOS: 68  days   Time spent= 35 mins    Mychael Smock Arsenio Loader, MD [facility]  If 7PM-7AM, please contact night-coverage  07/27/2021, 9:06 AM

## 2021-07-27 NOTE — Plan of Care (Signed)
?  Problem: Clinical Measurements: ?Goal: Ability to maintain clinical measurements within normal limits will improve ?Outcome: Not Progressing ?  ?

## 2021-07-27 NOTE — Progress Notes (Signed)
ANTICOAGULATION CONSULT NOTE - Follow Up Consult  Pharmacy Consult for Heparin Indication:  hx CVA an chronic UE thrombus  Allergies  Allergen Reactions   Sensipar [Cinacalcet]     Per MAR   Statins     Per MAR    Patient Measurements: Height: '5\' 7"'$  (170.2 cm) Weight:  (Unable to obtain, pt in recliner) IBW/kg (Calculated) : 61.6 - recorded weights widely variable 66.7kg - 105.4 kg Heparin Dosing Weight: 85 kg  Vital Signs: Temp: 98.2 F (36.8 C) (10/02 1628) BP: 180/77 (10/02 2037) Pulse Rate: 88 (10/02 2037)  Labs: Recent Labs    07/25/21 0337 07/26/21 0320 07/27/21 0213 07/27/21 1034 07/27/21 2048  HGB 8.0* 9.0* 7.7*  --   --   HCT 26.8* 29.8* 25.9*  --   --   PLT 217 222 234  --   --   HEPARINUNFRC 0.28* 0.37 0.20* 0.24* 0.53  CREATININE 3.36*  --   --   --   --     ESRD  Assessment: 75 y.o. female with hx CVA and chronic UE thrombus,  on apixaban prior to admission.  Patient with prolonged hospitalization and difficult disposition.  Family has elected to pursue PEG tube placement for ongoing nutritional needs.  Surgery consulted, see MD progress notes indicating surgery reluctant to place PEG.   Eliquis stopped due to potential for procedure  [facility] placement and PEG placement). Pharmacy consulted to bridge with  IV heparin pending surgical plans. Last dose Eliquis 9/23 at 0900.    Heparin levels began correlating with aPTT on 9/27 , thus no further aPTT's are needed.    Heparin level 0.53 and therapeutic on 1150 units/hr.    Goal of Therapy:  Heparin level 0.3-0.7 units/ml Monitor platelets by anticoagulation protocol: Yes   Plan:  Continue heparin drip to 1150 units/hr Daily heparin level and CBC Monitor for s/sx bleeding F/u restart apixaban     Benetta Spar, PharmD, BCPS, [facility] Clinical Pharmacist  Please check AMION for all [place] phone numbers After 10:00 PM, call [place]

## 2021-07-27 NOTE — Progress Notes (Signed)
ANTICOAGULATION CONSULT NOTE - Follow Up Consult  Pharmacy Consult for heparin Indication:  chronic UE thrombus and h/o CVA  Labs: Recent Labs    07/25/21 0337 07/26/21 0320 07/27/21 0213  HGB 8.0* 9.0* 7.7*  HCT 26.8* 29.8* 25.9*  PLT 217 222 234  HEPARINUNFRC 0.28* 0.37 0.20*  CREATININE 3.36*  --   --     Assessment: 75yo female subtherapeutic on heparin after one level at lower end of goal; no infusion issues or signs of bleeding per RN.  Goal of Therapy:  Heparin level 0.3-0.7 units/ml   Plan:  Will increase heparin infusion by 2 units/kg/hr to 1050 units/hr and check level in 8 hours.    Wynona Neat, PharmD, BCPS  07/27/2021,2:58 AM

## 2021-07-27 NOTE — Progress Notes (Signed)
Subjective: Sleeping awoken little voice interaction  Objective Vital signs in last 24 hours: Vitals:   07/26/21 1704 07/26/21 2058 07/27/21 0645 07/27/21 0916  BP: (!) 146/81 (!) 178/54 (!) 150/59 (!) 146/60  Pulse: 92 99 (!) 103 98  Resp: '18 18 18 18  '$ Temp: 98 F (36.7 C) 98.5 F (36.9 C) 98.6 F (37 C) 98.4 F (36.9 C)  TempSrc:  Axillary Oral Oral  SpO2:  100% 98% 96%  Weight:      Height:       Weight change:   Physical Exam: General: Chronically ill obese elderly female NAD, minimal speech interaction Heart: RRR 2/6 SEM no rub or gallop Lungs: CTA anteriorly nonlabored breathing Abdomen: Obese NABS, NTND Extremities: Trace hip edema and upper extremity, contracture bilateral upper extremity  dialysis Access: [facility]   Dialysis Orders: OP AF MWF  3h 31mn 400/500    62.5kg    3K/2.5 Ca P2  AVG HeRO   Hep none - Mircera 50 q 2 wks  (7/13)  Venofer 50 q wk - Parsabiv '5mg'$  IV q HD   Problem/Plan: AMS/FTT: Mental status that waxes/wanes.  HD recliner consistently for past 2 weeks - renal SW has reached out to prior HD unit to see if they will accept her back.   Melena/Rectal bleeding - GI consulted. Hx rectal ulcers.  EGD 7/27 normal, flex sig with mucosal ulceration c/w sterocoral ulcers, non bleeding. Hgb 8s, stable. ESRD: TDC dysfunction past Monday, so dialyzed Tues. yesterday back to MWF schedule  Access Issues/AVG clotted: Had f'gram  6/23 /22 which showed chronic thrombus and was started on Eliquis. AVG clotted 06/27/21, unable to be declotted d/t arm contracture. S/P conversion to tunneled HD catheter 07/02/21.  Hypertension/volume: BP high recently, on midodrine '10mg'$  TID with some overload on exam.  Noted NA 129 continue to maximize UF as tolerated, can give albumin with HD for BP support.  Order to hold midodrine on non-HD days with systolic over [id number] anemia of ESRD= a.m. Hgb 7.7 <9.0 Aranesp 200 MCG weekly, noted NA 129 will be volume related MBD= Phos 3.3, calcium over  10 with low albumin, no vitamin D or phosphate binder needed Nutrition: Severe PCM: Alb =2.0. CoreTrac removed-patient taking medications by mouth-continue assisting and encouraging patient with meals. 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 Psych eval from 9/14 determined that patient has functional mental capacity for medical decision-making. Psych re-consulted 9/23 to evaluate the patient again - at this point, patient does not have the capacity to make medical decisions. Ongoing discussions with patient's son continues. Dispo: DNR/DNI. Poor prognosis. Although patient is currently tolerating being in the chair for HD, we do not believe dialysis is adding to her quality of life. Appreciate palliative care involvement.  DErnest Haber PA-C [facility] [phone] [id number]/11/2020,11:32 AM  LOS: 68 days   Labs: Basic Metabolic Panel: Recent Labs  Lab 07/22/21 0805 07/23/21 0616 07/25/21 0337  NA 121* 130* 129*  K 4.0 3.8 3.6  CL 84* 91* 91*  CO2 '26 29 26  '$ GLUCOSE 101* 94 88  BUN 42* 25* 15  CREATININE 4.32* 3.49* 3.36*  CALCIUM 7.9* 8.4* 8.4*  PHOS >30.0* 3.9 3.3   Liver Function Tests: Recent Labs  Lab 07/22/21 0805 07/25/21 0337  ALBUMIN 1.6* 2.0*   No results for input(s): LIPASE, AMYLASE in the last 168 hours. No results for input(s): AMMONIA in the last 168 hours. CBC: Recent Labs  Lab 07/23/21 0616 07/24/21 0437 07/25/21  [id number] 07/26/21 0320 07/27/21 0213  WBC 8.8 9.7 9.9 10.6* 12.0*  HGB 8.5* 8.3* 8.0* 9.0* 7.7*  HCT 28.3* 27.9* 26.8* 29.8* 25.9*  MCV 94.6 94.3 93.7 93.7 93.5  PLT 278 241 217 222 234   Cardiac Enzymes: No results for input(s): CKTOTAL, CKMB, CKMBINDEX, TROPONINI in the last 168 hours. CBG: Recent Labs  Lab 07/26/21 1149 07/26/21 1616 07/26/21 2219 07/27/21 0737 07/27/21 1114  GLUCAP 124* 114* 108* 94 99    Studies/Results: No results found. Medications:  dextrose 40 mL/hr at 07/26/21 1925   heparin  1,050 Units/hr (07/27/21 0303)    atorvastatin  40 mg Oral Daily   chlorhexidine  15 mL Mouth Rinse BID   Chlorhexidine Gluconate Cloth  6 each Topical Daily   darbepoetin (ARANESP) injection - DIALYSIS  200 mcg Intravenous Q Fri-HD   feeding supplement  237 mL Oral BID BM   gabapentin  100 mg Oral Q12H   insulin aspart  0-6 Units Subcutaneous TID AC & HS   lactulose  20 g Oral Daily   mouth rinse  15 mL Mouth Rinse q12n4p   midodrine  10 mg Oral TID WC   mirtazapine  7.5 mg Oral QHS   multivitamin  1 tablet Oral QHS   OLANZapine zydis  2.5 mg Oral QHS   pantoprazole sodium  40 mg Oral Daily   sodium chloride flush  10-40 mL Intracatheter Q12H   thiamine  100 mg Oral Daily   venlafaxine  37.5 mg Oral BID

## 2021-07-28 DIAGNOSIS — F0280 Dementia in other diseases classified elsewhere without behavioral disturbance: Secondary | ICD-10-CM | POA: Diagnosis not present

## 2021-07-28 DIAGNOSIS — R1319 Other dysphagia: Secondary | ICD-10-CM | POA: Diagnosis not present

## 2021-07-28 DIAGNOSIS — R627 Adult failure to thrive: Secondary | ICD-10-CM | POA: Diagnosis not present

## 2021-07-28 DIAGNOSIS — I6330 Cerebral infarction due to thrombosis of unspecified cerebral artery: Secondary | ICD-10-CM | POA: Diagnosis not present

## 2021-07-28 LAB — CBC
HCT: 28 % — ABNORMAL LOW (ref 36.0–46.0)
Hemoglobin: 8.6 g/dL — ABNORMAL LOW (ref 12.0–15.0)
MCH: 28.2 pg (ref 26.0–34.0)
MCHC: 30.7 g/dL (ref 30.0–36.0)
MCV: 91.8 fL (ref 80.0–100.0)
Platelets: 277 10*3/uL (ref 150–400)
RBC: 3.05 MIL/uL — ABNORMAL LOW (ref 3.87–5.11)
RDW: 17 % — ABNORMAL HIGH (ref 11.5–15.5)
WBC: 13.1 10*3/uL — ABNORMAL HIGH (ref 4.0–10.5)
nRBC: 0.2 % (ref 0.0–0.2)

## 2021-07-28 LAB — COMPREHENSIVE METABOLIC PANEL
ALT: 8 U/L (ref 0–44)
AST: 15 U/L (ref 15–41)
Albumin: 1.9 g/dL — ABNORMAL LOW (ref 3.5–5.0)
Alkaline Phosphatase: 88 U/L (ref 38–126)
Anion gap: 10 (ref 5–15)
BUN: 12 mg/dL (ref 8–23)
CO2: 27 mmol/L (ref 22–32)
Calcium: 8.4 mg/dL — ABNORMAL LOW (ref 8.9–10.3)
Chloride: 89 mmol/L — ABNORMAL LOW (ref 98–111)
Creatinine, Ser: 3.62 mg/dL — ABNORMAL HIGH (ref 0.44–1.00)
GFR, Estimated: 13 mL/min — ABNORMAL LOW (ref >60)
Glucose, Bld: 107 mg/dL — ABNORMAL HIGH (ref 70–99)
Potassium: 4 mmol/L (ref 3.5–5.1)
Sodium: 126 mmol/L — ABNORMAL LOW (ref 135–145)
Total Bilirubin: 0.3 mg/dL (ref 0.3–1.2)
Total Protein: 6.1 g/dL — ABNORMAL LOW (ref 6.5–8.1)

## 2021-07-28 LAB — GLUCOSE, CAPILLARY
Glucose-Capillary: 103 mg/dL — ABNORMAL HIGH (ref 70–99)
Glucose-Capillary: 123 mg/dL — ABNORMAL HIGH (ref 70–99)
Glucose-Capillary: 154 mg/dL — ABNORMAL HIGH (ref 70–99)
Glucose-Capillary: 138 mg/dL — ABNORMAL HIGH (ref 70–99)

## 2021-07-28 LAB — HEPARIN LEVEL (UNFRACTIONATED): Heparin Unfractionated: 0.59 IU/mL (ref 0.30–0.70)

## 2021-07-28 MED ORDER — ALTEPLASE 2 MG IJ SOLR
3.9000 mg | Freq: Once | INTRAMUSCULAR | Status: AC
  Administered 2021-07-28: 3.9 mg
  Filled 2021-07-28: qty 4

## 2021-07-28 NOTE — Progress Notes (Signed)
Progress Note  68 Days Post-Op  Subjective: Patient denies abdominal pain this AM. Alert and oriented to person, place, time and situation but still having some confusion. Patient reports she does not want a feeding tube but that her son wants her to have one.   Objective: Vital signs in last 24 hours: Temp:  [97.8 F (36.6 C)-98.2 F (36.8 C)] 97.8 F (36.6 C) (10/03 0859) Pulse Rate:  [88-100] 100 (10/03 0859) Resp:  [16-22] 16 (10/03 0859) BP: (144-180)/(77-85) 156/85 (10/03 0859) SpO2:  [98 %-100 %] 100 % (10/03 0859) Last BM Date: 07/27/21  Intake/Output from previous day: 10/02 0701 - 10/03 0700 In: 1228 [I.V.:1228] Out: 0  Intake/Output this shift: No intake/output data recorded.  PE: General: pleasant, WD, chronically ill appearing female who is laying in bed in NAD Heart: regular, rate, and rhythm.  Lungs: Respiratory effort nonlabored Abd: soft, NT, ND, +BS Psych: A&Ox4, intermittently confused and grabbing at things that aren't there   Lab Results:  Recent Labs    07/27/21 0213 07/28/21 0630  WBC 12.0* 13.1*  HGB 7.7* 8.6*  HCT 25.9* 28.0*  PLT 234 277   BMET Recent Labs    07/28/21 0630  NA 126*  K 4.0  CL 89*  CO2 27  GLUCOSE 107*  BUN 12  CREATININE 3.62*  CALCIUM 8.4*   PT/INR No results for input(s): LABPROT, INR in the last 72 hours. CMP     Component Value Date/Time   NA 126 (L) 07/28/2021 0630   K 4.0 07/28/2021 0630   K 4.3 08/16/2012 1201   CL 89 (L) 07/28/2021 0630   CO2 27 07/28/2021 0630   GLUCOSE 107 (H) 07/28/2021 0630   BUN 12 07/28/2021 0630   CREATININE 3.62 (H) 07/28/2021 0630   CALCIUM 8.4 (L) 07/28/2021 0630   PROT 6.1 (L) 07/28/2021 0630   ALBUMIN 1.9 (L) 07/28/2021 0630   AST 15 07/28/2021 0630   ALT 8 07/28/2021 0630   ALKPHOS 88 07/28/2021 0630   BILITOT 0.3 07/28/2021 0630   GFRNONAA 13 (L) 07/28/2021 0630   Lipase     Component Value Date/Time   LIPASE 31 01/18/2021 1548        Studies/Results: No results found.  Anti-infectives: Anti-infectives (From admission, onward)    Start     Dose/Rate Route Frequency Ordered Stop   07/02/21 1116  ceFAZolin (ANCEF) IVPB 2g/100 mL premix        over 30 Minutes Intravenous Continuous PRN 07/02/21 1139 07/02/21 1116   07/02/21 1111  ceFAZolin (ANCEF) 2-4 GM/100ML-% IVPB       Note to Pharmacy: Domenick Bookbinder   : cabinet override      07/02/21 1111 07/02/21 2314   07/01/21 1215  ceFAZolin (ANCEF) IVPB 2g/100 mL premix        2 g 200 mL/hr over 30 Minutes Intravenous On call 07/01/21 0902 07/02/21 1215   05/29/21 1622  ceFAZolin (ANCEF) 2-4 GM/100ML-% IVPB       Note to Pharmacy: Lytle Butte   : cabinet override      05/29/21 1622 05/30/21 0429   05/29/21 0000  cefTRIAXone (ROCEPHIN) 1 g in sodium chloride 0.9 % 100 mL IVPB  Status:  Discontinued        1 g 200 mL/hr over 30 Minutes Intravenous Every 24 hours 05/28/21 1801 05/28/21 1801   05/28/21 1830  cefTRIAXone (ROCEPHIN) 1 g in sodium chloride 0.9 % 100 mL IVPB  1 g 200 mL/hr over 30 Minutes Intravenous Every 24 hours 05/28/21 1801 05/30/21 1907   05/28/21 1630  ceFAZolin (ANCEF) IVPB 2g/100 mL premix        2 g 200 mL/hr over 30 Minutes Intravenous To Radiology 05/28/21 1537 05/29/21 1630        Assessment/Plan  Malnutrition/refusal to eat/failure to thrive   - patient mental status waxes and wanes but she was recently deemed not to have the capacity to make medical decisions, according to psychiatry.  - SLP saw today, she does not have dysphagia but is frequently disgusted by food and therefore has poor PO intake - given her comorbid conditions the patient is a high risk for perioperative complications, worsening delirium, and further functional decline. - I have tried to reach out to son, [name], twice today and have not been able to reach him. I will try to contact again tomorrow but decision on whether or not to place gastrostomy will  need to be made by patient's son - gastrostomy placement will not improve patient's quality of life any and I am concerned that she may pull out gastrostomy given intermittent confusion and her not wanting gastrostomy during lucid moments    Acute on chronic LGIB from stercoral ulcers Hx large R MCA CVA Acute delirium ESRD on HD  T2DM Chronic thrombus of upper extremity GERD  LOS: 73 days    Norm Parcel, [facility] 07/28/2021, 9:35 AM Please see Amion for pager number during day hours 7:00am-4:30pm

## 2021-07-28 NOTE — Progress Notes (Signed)
   07/28/21 1735  Vitals  Temp 98.4 F (36.9 C)  Temp Source Oral  BP (!) 166/56  BP Location Left Leg  BP Method Automatic  Patient Position (if appropriate) Lying  Pulse Rate (!) 109  Pulse Rate Source Monitor  Resp 17  Oxygen Therapy  SpO2 96 %  O2 Device Room Air  Pain Assessment  Pain Scale Faces  Pain Score 2  Pain Type Acute pain  Pain Location Generalized  Pain Intervention(s) Repositioned  Dialysis Weight  Weight  (uto pt in recliner)  Post-Hemodialysis Assessment  Rinseback Volume (mL) 250 mL  KECN 173 V  Dialyzer Clearance Lightly streaked  Duration of HD Treatment -hour(s) 4 hour(s)  Hemodialysis Intake (mL) 500 mL  UF Total -Machine (mL) 3000 mL  Net UF (mL) 2500 mL  Tolerated HD Treatment Yes  Post-Hemodialysis Comments tx complete-pt stable  Hemodialysis Catheter Left Subclavian Double lumen Permanent (Tunneled)  Placement Date/Time: 07/02/21 1206   Time Out: Correct patient;Correct site;Correct procedure  Maximum sterile barrier precautions: Hand hygiene;Cap;Sterile gown;Mask;Sterile gloves;Large sterile sheet  Site Prep: Chlorhexidine (preferred);Skin Prep C...  Site Condition No complications  Blue Lumen Status Flushed;Dead end cap in place  Red Lumen Status Flushed;Dead end cap in place  Catheter fill solution Other (Comment) (cathflo)  Dressing Type Transparent  Dressing Status Clean;Dry;Intact  Antimicrobial disc in place? Yes  Interventions  (assessed)  Drainage Description None  Dressing Change Due 08/01/21  Post treatment catheter status Capped and Clamped  HD tx complete, pt stable. Pt noted with HR 100s-110s, minimal goal decreased, goal still met. Oxy given x1 for pain, partial effects noted. High arterial pressures noted despite switching blood lines. Katie-PA on unit and made aware. HD cath locked with cathflo until next tx.

## 2021-07-28 NOTE — Progress Notes (Signed)
[facility] PROGRESS NOTE  MACKYNZIE WOOLFORD HKV:[id number] DOB: 1946/05/24 DOA: 05/19/2021 PCP: [name], MD  Status:  Remains inpatient appropriate because:Unsafe d/c plan, IV treatments appropriate due to intensity of illness or inability to take PO, and Inpatient level of care appropriate due to severity of illness  Dispo: The patient is from: Home              Anticipated d/c is to: SNF              Patient currently is medically stable to d/c.   Difficult to place patient Yes              Barriers to discharge: Dialysis patient with markedly decreased mobility-we will need to prove tolerance of sitting in chair for hemodialysis while here as well as tolerating sitting in wheelchair for up to 1 hour x 2 to replicate transport process.  Cannot discharge and proceed with outpatient hemodialysis unless this is accomplished.    Level of care: Med-Surg  Code Status: DNR Family communication: Spoke with patient's son on 9/29. He is very conflicted given the etiology of her dysphagia.  It was explained to him that she likely has a dementia that had been undiagnosed prior to admission and given this diagnosis it is doubtful she would ever recover her appetite/desire to eat.  I reminded him that this is a high risk surgery for her and could potentially cause more harm than expected good.  I discussed with him that he needs to take into account that she does not want the tube and the tube is not going improve her quality of life.  As per previous discussions we talked about not feeding her and waiting for the inevitable to occur and then stopping dialysis and focusing on comfort.  He states he needs some time to think about whether or not it is appropriate to place a tube and wishes to discuss with other family members.  I told him that was more than appropriate.  I also updated the surgical team and asked them to speak with the patient's son regarding risks of surgery.  They plan to  speak with his son on Monday, October 3 DVT prophylaxis: Heparin COVID vaccination status: [name] 02/26/2020, Pfizer 04/10/2021    HPI: 75 y.o. 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 Patient presented to the ED on 7/25 with dark stool.  On presentation, hemoglobin was 9.6; GI was consulted.  She underwent EGD which was normal and flexible sigmoidoscopy which showed nonbleeding distal rectal ulcers/stercoral ulcers.  Subsequently, GI signed off.  Neurology was also consulted for altered mental status; MRI brain was similar to prior MRI at [facility] with a large right MCA stroke.  EEG this admission unremarkable. Nephrology has been following for dialysis needs. PEG tube was desired by family but could not be placed because of overlying transverse colon.  General surgery was consulted for G-tube placement but patient does not want a G-tube and  was deemed to have capacity by psych team.  Subsequently after further more detailed evaluations and observation it was finally clarified that she lacked capacity.  Surgical team was consulted the fact that the patient lacks underlying dysphagia and even though she lacks capacity  continues to express a desire to not have a gastrostomy tube placed they are reluctant to proceed with surgery and have ordered follow-up SLP evaluation regarding dysphagia.  Subjective: Patient very confused this morning.  Talking to persons who were not in the room when I entered the room.  Able to reorient effectively.  Objective: Vitals:   07/27/21 1628 07/27/21 2037  BP: (!) 144/83 (!) 180/77  Pulse: 90 88  Resp: 16 (!) 22  Temp: 98.2 F (36.8 C) 98.2 F (36.8 C)  SpO2:  100% 98%    Intake/Output Summary (Last 24 hours) at 07/28/2021 0810 Last data filed at 07/28/2021 0600 Gross per 24 hour  Intake 1228.04 ml  Output 0 ml  Net 1228.04 ml   Filed Weights   07/22/21 1122  Weight: 68.4 kg    Exam:  Constitutional: NAD, awake and actively talking to persons who were not in the room Respiratory: Lungs CTA, stable on room air, pulse oximetry  100% Cardiovascular: Heart sounds S1-S2, stable chronic upper extremity edema, normotensive  Abdomen: Soft and nondistended with normoactive bowel sounds.[name] LBM 10/02-still not eating so she has started drinking very small amounts of liquids Neurologic: CN 2-12 grossly intact -Strength 1/5 on the right-unable to use her right arm with purpose due to significant dysmetria. Chronic left hemiparesis from prior CVA with associated left-sided neglect Psychiatric: Awake and alert; oriented today and appears to be hallucinating noting she is talking to people that are not in the room- DOES NOT HAVE CAPACITY.  Assessment/Plan: Acute problems: Failure to thrive /Generalized deconditioning  /very poor oral intake/ After multiple evaluations by the psychiatric team it was determined the patient lacked capacity to make medical or other life decisions. Surgery has reevaluated the patient and have concerns regarding placement of a feeding tube noting her dementia is related to cognitive issues and is only expected to worsen -open G-tube placement in this debilitated patient on dialysis is very high risk 10/3 surgical team to speak with son [name] today-after they have spoken with him to explain risks and benefits I will contact him on Tuesday to determine what he and his family decided regarding placement of feeding tube-we will remind him that the only reason she currently is doing well since she is not eating is because we have placed her on 10% dextrose fluid until decision made. SLP swallowing evaluation consistent with  cognitive-based dysphagia resulting in anorexia, resistance and disgust with food. 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  Surmise this lady had dementia that was undetected prior to admission and given recent stroke and change in environment the dementia has worsened -Continue olanzapine at at bedtime  ESRD on HD Continue HD MWF  Hyperkalemia/hypophosphatemia Management per nephrology HD -supplementation as needed   Hypotension Blood pressure currently is stable on midodrine.  Constipation Patient was on chronic narcotics prior to admission and these remain in place Continue Chronulac daily  Type 2 diabetes mellitus Episodes of hypoglycemia Hemoglobin A1c 5.1.  Diet controlled at baseline  Chronic thrombus left upper extremity Continue heparin  Stage II mid coccyx pressure injury, present on admission Continue wound care  GERD Continue Protonix and Pepcid     Data Reviewed: Basic Metabolic Panel: Recent Labs  Lab 07/22/21 0805 07/23/21 0616 07/25/21 0337 07/28/21 0630  NA 121* 130* 129* 126*  K 4.0 3.8 3.6 4.0  CL 84* 91* 91* 89*  CO2 '26 29 26 27  '$ GLUCOSE 101* 94 88 107*  BUN 42* 25* 15 12  CREATININE 4.32* 3.49* 3.36* 3.62*  CALCIUM 7.9* 8.4* 8.4* 8.4*  PHOS >30.0* 3.9 3.3  --    Liver Function Tests: Recent Labs  Lab 07/22/21 0805 07/25/21 0337 07/28/21 0630  AST  --   --  15  ALT  --   --  8  ALKPHOS  --   --  88  BILITOT  --   --  0.3  PROT  --   --  6.1*  ALBUMIN 1.6* 2.0* 1.9*   No results for input(s):  LIPASE, AMYLASE in the last 168 hours. No results for input(s): AMMONIA in the last 168 hours. CBC: Recent Labs  Lab 07/24/21 0437 07/25/21 0337 07/26/21 0320 07/27/21 0213 07/28/21 0630  WBC 9.7 9.9 10.6* 12.0* 13.1*  HGB 8.3* 8.0* 9.0* 7.7* 8.6*  HCT 27.9* 26.8* 29.8* 25.9* 28.0*  MCV 94.3 93.7 93.7 93.5 91.8  PLT 241 217 222 234 277    CBG: Recent Labs  Lab 07/27/21 0737 07/27/21 1114 07/27/21 1629 07/27/21 2130 07/28/21 0634  GLUCAP 94 99 116* 97 103*     Scheduled Meds:  atorvastatin  40 mg Oral Daily   chlorhexidine  15 mL Mouth Rinse BID   Chlorhexidine Gluconate Cloth  6 each Topical Daily   darbepoetin (ARANESP) injection - DIALYSIS  200 mcg Intravenous Q Fri-HD   feeding supplement  237 mL Oral BID BM   gabapentin  100 mg Oral Q12H   insulin aspart  0-6 Units Subcutaneous TID AC & HS   lactulose  20 g Oral Daily   mouth rinse  15 mL Mouth Rinse q12n4p   midodrine  10 mg Oral TID WC   mirtazapine  7.5 mg Oral QHS   multivitamin  1 tablet Oral QHS   OLANZapine zydis  2.5 mg Oral QHS   pantoprazole sodium  40 mg Oral Daily   sodium chloride flush  10-40 mL Intracatheter Q12H   thiamine  100 mg Oral Daily   venlafaxine  37.5 mg Oral BID   Continuous Infusions:  dextrose 40 mL/hr at 07/27/21 2031   heparin 1,150 Units/hr (07/27/21 1707)    Principal Problem:   GI bleed Active Problems:   Essential hypertension   Type 2 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, with long-term current use of insulin (HCC)   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   ESRD on dialysis ([place])   Dyslipidemia   Hypokalemia   Stage 2 skin ulcer of sacral region ([place])   Cerebral thrombosis with cerebral infarction   Protein-calorie malnutrition, severe   Acute GI bleeding   Oropharyngeal dysphagia   Failure to thrive in adult   Inadequate oral intake   Clogged feeding tube   Fever   Rectal bleeding   Delirium due to another  medical condition   Neurogenic dysphagia   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

## 2021-07-28 NOTE — Progress Notes (Signed)
Pt given prn oxy solution po, sent by pharmacy. 0.75 ml wasted to administer correct dosage. Witnessed by [facility].

## 2021-07-28 NOTE — Progress Notes (Signed)
Speech Language Pathology Treatment: Dysphagia  Patient Details Name: Amy [name] MRN: [id number] DOB: 14-Jul-1946 Today's Date: 07/28/2021 Time: 1140-1150 SLP Time Calculation (min) (ACUTE ONLY): 10 min  Assessment / Plan / Recommendation Clinical Impression  Pt seen with lunch meal, breakfast tray also at bedside, not touched. SLP able to sneak in two bites of chew able food and three bites of mashed potatoes before pt adamantly refused. SLP then pressured pt to eat about 1/2 her magic cup. She appears disgusted by food, states she might throw up. Verbalizes no appetite or interest. Consumed about 10% of meal. Pt has not made any improvement and cognitive impairments are stable, pt not progressing with inventions. Will sign off.   HPI HPI: Pt is a 75 y.o. female who presented to ER for dark stool with + fecal occult. EGD 7/27: normal without evidence of GI bleed. Palliative care consulted and code status has been changed to DNR. Per EMR, pt's family has noted "physical, fucntional and cognitive decliene over the past several months but dramatic decline in the past 35 days". IR was consulted for PEG placement; unfortunately unable to place on 8/4 due to overlying transverse colon.  There have been ongoing concerns regarding pts oral intake, refusing food and continuing to require Cortrak feeding. Pt has demonstrated cognitive impiarment and has refused PEG tube though it seems needed given plan of care. 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 Palliative care following.      SLP Plan  Continue with current plan of care      Recommendations for follow up therapy are one component of a multi-disciplinary discharge planning process, led by the attending physician.  Recommendations may be updated based on patient status, additional functional criteria and insurance authorization.    Recommendations  Diet recommendations: Regular;Thin  liquid Liquids provided via: Cup;Straw Medication Administration: Crushed with puree Supervision: Full supervision/cueing for compensatory strategies                Oral Care Recommendations: Oral care BID Plan: Continue with current plan of care       GO                Amy [name], Katherene Ponto  07/28/2021, 1:48 PM

## 2021-07-28 NOTE — Progress Notes (Signed)
Amy [name] Progress Note   Subjective:  Seen in room - talkative. Denies CP/dyspnea. Tray at bedside, encouraged to eat breakfast. Has been tentatively accepted back to outpatient dialysis - needs SNF.  Objective Vitals:   07/27/21 0916 07/27/21 1628 07/27/21 2037 07/28/21 0859  BP: (!) 146/60 (!) 144/83 (!) 180/77 (!) 156/85  Pulse: 98 90 88 100  Resp: 18 16 (!) 22 16  Temp: 98.4 F (36.9 C) 98.2 F (36.8 C) 98.2 F (36.8 C) 97.8 F (36.6 C)  TempSrc: Oral  Axillary   SpO2: 96% 100% 98% 100%  Weight:      Height:       Physical Exam General: Chronically ill appearing woman, NAD. Room air. Heart: RRR; 2/6 murmur Lungs: CTA anteriorly Abdomen: soft Extremities: Trace hip edema, chest wall and UE edema improving Dialysis Access: [facility]  Additional Objective Labs: Basic Metabolic Panel: Recent Labs  Lab 07/22/21 0805 07/23/21 0616 07/25/21 0337 07/28/21 0630  NA 121* 130* 129* 126*  K 4.0 3.8 3.6 4.0  CL 84* 91* 91* 89*  CO2 '26 29 26 27  '$ GLUCOSE 101* 94 88 107*  BUN 42* 25* 15 12  CREATININE 4.32* 3.49* 3.36* 3.62*  CALCIUM 7.9* 8.4* 8.4* 8.4*  PHOS >30.0* 3.9 3.3  --    Liver Function Tests: Recent Labs  Lab 07/22/21 0805 07/25/21 0337 07/28/21 0630  AST  --   --  15  ALT  --   --  8  ALKPHOS  --   --  88  BILITOT  --   --  0.3  PROT  --   --  6.1*  ALBUMIN 1.6* 2.0* 1.9*   CBC: Recent Labs  Lab 07/24/21 0437 07/25/21 0337 07/26/21 0320 07/27/21 0213 07/28/21 0630  WBC 9.7 9.9 10.6* 12.0* 13.1*  HGB 8.3* 8.0* 9.0* 7.7* 8.6*  HCT 27.9* 26.8* 29.8* 25.9* 28.0*  MCV 94.3 93.7 93.7 93.5 91.8  PLT 241 217 222 234 277   CBG: Recent Labs  Lab 07/27/21 0737 07/27/21 1114 07/27/21 1629 07/27/21 2130 07/28/21 0634  GLUCAP 94 99 116* 97 103*   Medications:  dextrose 40 mL/hr at 07/27/21 2031   heparin 1,150 Units/hr (07/27/21 1707)    atorvastatin  40 mg Oral Daily   chlorhexidine  15 mL Mouth Rinse BID   Chlorhexidine  Gluconate Cloth  6 each Topical Daily   darbepoetin (ARANESP) injection - DIALYSIS  200 mcg Intravenous Q Fri-HD   feeding supplement  237 mL Oral BID BM   gabapentin  100 mg Oral Q12H   insulin aspart  0-6 Units Subcutaneous TID AC & HS   lactulose  20 g Oral Daily   mouth rinse  15 mL Mouth Rinse q12n4p   midodrine  10 mg Oral TID WC   mirtazapine  7.5 mg Oral QHS   multivitamin  1 tablet Oral QHS   OLANZapine zydis  2.5 mg Oral QHS   pantoprazole sodium  40 mg Oral Daily   sodium chloride flush  10-40 mL Intracatheter Q12H   thiamine  100 mg Oral Daily   venlafaxine  37.5 mg Oral BID    Dialysis Orders: OP AF MWF  3h 13mn 400/500 62.5kg 3K/2.5 Ca P2 AVG HeRO   Hep none - Mircera 50 q 2 wks  (7/13)  Venofer 50 q wk - Parsabiv '5mg'$  [facility]  Assessment/Plan: AMS/FTT: Mental status that waxes/wanes.  HD recliner consistently for past 2 weeks -  tentatively accepted back to outpatient HD unit, needs SNF. Melena/Rectal bleeding - GI consulted. Hx rectal ulcers.  EGD 7/27 normal, flex sig with mucosal ulceration c/w sterocoral ulcers, non bleeding. Hgb 8s, stable. ESRD: Back to usual MWF schedule - for HD today. 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 AVG clotted 06/27/21, unable to be declotted d/t arm contracture. S/P conversion to tunneled HD catheter 07/02/21.  Hypertension/volume: BP high recently, on midodrine '10mg'$  TID with some overload on exam.  Order to hold midodrine on non-HD days with systolic over [id number]. UF as tolerated today. Anemia of ESRD: Hgb 8.6, continue Aranesp 259mg q Fri. MBD: CorrCa slightly high, Phos low. No binder or VDRA needed Nutrition: Alb very low. CoreTrac removed, continue to encourage full meals and PO supplements. 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 Psych eval from 9/14 determined that patient has functional mental capacity for medical decision-making.  Psych re-consulted 9/23 to evaluate the patient again - at this point, patient does not have the capacity to make medical decisions. Ongoing discussions with patient's son continues. Dispo: DNR/DNI. Poor prognosis. Although patient is currently tolerating being in the chair for HD, we do not believe dialysis is adding to her quality of life. Appreciate palliative care involvement.  KVeneta Penton PA-C 07/28/2021, 9:59 AM  [employer]

## 2021-07-28 NOTE — Plan of Care (Signed)
  Problem: Activity: Goal: Risk for activity intolerance will decrease Outcome: Not Progressing   Problem: Nutrition: Goal: Adequate nutrition will be maintained Outcome: Not Progressing   

## 2021-07-28 NOTE — Progress Notes (Signed)
No family bed side offered the hospital breakfast bedside, patient said did not want.

## 2021-07-29 ENCOUNTER — Inpatient Hospital Stay (HOSPITAL_COMMUNITY): Payer: Medicare Other

## 2021-07-29 DIAGNOSIS — R627 Adult failure to thrive: Secondary | ICD-10-CM | POA: Diagnosis not present

## 2021-07-29 DIAGNOSIS — F0280 Dementia in other diseases classified elsewhere without behavioral disturbance: Secondary | ICD-10-CM | POA: Diagnosis not present

## 2021-07-29 DIAGNOSIS — D72829 Elevated white blood cell count, unspecified: Secondary | ICD-10-CM | POA: Insufficient documentation

## 2021-07-29 DIAGNOSIS — R1319 Other dysphagia: Secondary | ICD-10-CM | POA: Diagnosis not present

## 2021-07-29 DIAGNOSIS — I6330 Cerebral infarction due to thrombosis of unspecified cerebral artery: Secondary | ICD-10-CM | POA: Diagnosis not present

## 2021-07-29 DIAGNOSIS — R9389 Abnormal findings on diagnostic imaging of other specified body structures: Secondary | ICD-10-CM | POA: Insufficient documentation

## 2021-07-29 LAB — COMPREHENSIVE METABOLIC PANEL
ALT: 9 U/L (ref 0–44)
AST: 17 U/L (ref 15–41)
Albumin: 2 g/dL — ABNORMAL LOW (ref 3.5–5.0)
Alkaline Phosphatase: 93 U/L (ref 38–126)
Anion gap: 8 (ref 5–15)
BUN: 7 mg/dL — ABNORMAL LOW (ref 8–23)
CO2: 28 mmol/L (ref 22–32)
Calcium: 8.4 mg/dL — ABNORMAL LOW (ref 8.9–10.3)
Chloride: 93 mmol/L — ABNORMAL LOW (ref 98–111)
Creatinine, Ser: 2.72 mg/dL — ABNORMAL HIGH (ref 0.44–1.00)
GFR, Estimated: 18 mL/min — ABNORMAL LOW (ref >60)
Glucose, Bld: 122 mg/dL — ABNORMAL HIGH (ref 70–99)
Potassium: 3.8 mmol/L (ref 3.5–5.1)
Sodium: 129 mmol/L — ABNORMAL LOW (ref 135–145)
Total Bilirubin: 0.4 mg/dL (ref 0.3–1.2)
Total Protein: 5.8 g/dL — ABNORMAL LOW (ref 6.5–8.1)

## 2021-07-29 LAB — GLUCOSE, CAPILLARY
Glucose-Capillary: 124 mg/dL — ABNORMAL HIGH (ref 70–99)
Glucose-Capillary: 144 mg/dL — ABNORMAL HIGH (ref 70–99)
Glucose-Capillary: 132 mg/dL — ABNORMAL HIGH (ref 70–99)
Glucose-Capillary: 140 mg/dL — ABNORMAL HIGH (ref 70–99)

## 2021-07-29 LAB — CBC
HCT: 26.6 % — ABNORMAL LOW (ref 36.0–46.0)
Hemoglobin: 8.2 g/dL — ABNORMAL LOW (ref 12.0–15.0)
MCH: 28.5 pg (ref 26.0–34.0)
MCHC: 30.8 g/dL (ref 30.0–36.0)
MCV: 92.4 fL (ref 80.0–100.0)
Platelets: 211 10*3/uL (ref 150–400)
RBC: 2.88 MIL/uL — ABNORMAL LOW (ref 3.87–5.11)
RDW: 17 % — ABNORMAL HIGH (ref 11.5–15.5)
WBC: 17.1 10*3/uL — ABNORMAL HIGH (ref 4.0–10.5)
nRBC: 0.2 % (ref 0.0–0.2)

## 2021-07-29 LAB — HEPARIN LEVEL (UNFRACTIONATED): Heparin Unfractionated: 0.59 IU/mL (ref 0.30–0.70)

## 2021-07-29 LAB — MRSA NEXT GEN BY PCR, NASAL: MRSA by PCR Next Gen: NOT DETECTED

## 2021-07-29 LAB — SEDIMENTATION RATE: Sed Rate: 37 mm/hr — ABNORMAL HIGH (ref 0–22)

## 2021-07-29 LAB — C-REACTIVE PROTEIN: CRP: 11.6 mg/dL — ABNORMAL HIGH (ref <1.0)

## 2021-07-29 MED ORDER — VANCOMYCIN HCL 1750 MG/350ML IV SOLN
1750.0000 mg | Freq: Once | INTRAVENOUS | Status: AC
  Administered 2021-07-29: 1750 mg via INTRAVENOUS
  Filled 2021-07-29: qty 350

## 2021-07-29 MED ORDER — VANCOMYCIN HCL 750 MG/150ML IV SOLN
750.0000 mg | INTRAVENOUS | Status: DC
  Administered 2021-07-30 – 2021-08-01 (×2): 750 mg via INTRAVENOUS
  Filled 2021-07-29 (×3): qty 150

## 2021-07-29 MED ORDER — VANCOMYCIN HCL 750 MG/150ML IV SOLN
750.0000 mg | Freq: Once | INTRAVENOUS | Status: DC
  Filled 2021-07-29: qty 150

## 2021-07-29 MED ORDER — SODIUM CHLORIDE 0.9 % IV SOLN
1.0000 g | INTRAVENOUS | Status: AC
Start: 2021-07-29 — End: 2021-08-04
  Administered 2021-07-29 – 2021-08-04 (×7): 1 g via INTRAVENOUS
  Filled 2021-07-29 (×11): qty 1

## 2021-07-29 NOTE — Progress Notes (Signed)
No family at bedside with outside food to feed pt at dinner

## 2021-07-29 NOTE — Consult Note (Signed)
WOC Nurse Consult Note: Patient receiving care in [facility] [phone]. 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 Wound bed: pink, some yellow, but mostly yellow drainage. Drainage (amount, consistency, odor) no odor, yellow drainage on existing dressing. Periwound: intact. Partial superficial skin loss to left upper buttock area, approximately 3 cm in diameter, no depth. Foam dressing over this area. Dressing procedure/placement/frequency: The orders placed by WOC, NP K. Sanders on 07/09/21 had expired.  They were reinitiated today. Patient is on a low air loss mattress and with bilateral Prevalon heel lift boots.  [facility] nurse will not follow at this time.  Please re-consult the [facility] team if needed.  Val Riles, RN, MSN, CWOCN, CNS-BC, pager [phone]

## 2021-07-29 NOTE — Progress Notes (Signed)
Pharmacy Antibiotic Note  Amy [name] is a 75 y.o. female admitted on 05/19/2021 with pneumonia.  Pharmacy has been consulted for vancomycin and cefepime dosing.  Plan: Cefepime 1 gram IV qday after HD Load dose: Vancomycin 1750 mg IV X1 Maintenance dose: Vancomycin 750 mg IV Q MWF post HD  Height: '5\' 7"'$  (170.2 cm) Weight:  (uto pt in recliner) IBW/kg (Calculated) : 61.6  Temp (24hrs), Avg:98.2 F (36.8 C), Min:98.1 F (36.7 C), Max:98.4 F (36.9 C)  Recent Labs  Lab 07/23/21 0616 07/24/21 0437 07/25/21 0337 07/26/21 0320 07/27/21 0213 07/28/21 0630 07/29/21 0616  WBC 8.8   < > 9.9 10.6* 12.0* 13.1* 17.1*  CREATININE 3.49*  --  3.36*  --   --  3.62* 2.72*   < > = values in this interval not displayed.    Estimated Creatinine Clearance: 17.9 mL/min (A) (by C-G formula based on SCr of 2.72 mg/dL (H)).    Allergies  Allergen Reactions   Sensipar [Cinacalcet]     Per MAR   Statins     Per MAR    Thank you for allowing pharmacy to be a part of this patient's care.  Craig Guess Franca Stakes PharmD, BCPS 07/29/2021 1:49 PM

## 2021-07-29 NOTE — Progress Notes (Signed)
ANTICOAGULATION CONSULT NOTE - Follow Up Consult  Pharmacy Consult for Heparin Indication:  hx CVA an chronic UE thrombus  Allergies  Allergen Reactions   Sensipar [Cinacalcet]     Per MAR   Statins     Per [facility]    Patient Measurements: Height: '5\' 7"'$  (170.2 cm) Weight:  (uto pt in recliner) IBW/kg (Calculated) : 61.6 - recorded weights widely variable 66.7kg - 105.4 kg Heparin Dosing Weight: 85 kg  Vital Signs: Temp: 98.2 F (36.8 C) (10/04 0526) Temp Source: Oral (10/04 0526) BP: 140/89 (10/04 0526) Pulse Rate: 101 (10/04 0526)  Labs: Recent Labs    07/27/21 0213 07/27/21 1034 07/27/21 2048 07/28/21 0630 07/29/21 0616  HGB 7.7*  --   --  8.6* 8.2*  HCT 25.9*  --   --  28.0* 26.6*  PLT 234  --   --  277 211  HEPARINUNFRC 0.20*   < > 0.53 0.59 0.59  CREATININE  --   --   --  3.62* 2.72*   < > = values in this interval not displayed.    ESRD  Assessment: 75 y.o. female with hx CVA and chronic UE thrombus,  on apixaban prior to admission.  Patient with prolonged hospitalization and difficult disposition.  Family has elected to pursue PEG tube placement for ongoing nutritional needs.  Surgery consulted, see MD progress notes indicating surgery reluctant to place PEG.   Eliquis stopped due to potential for procedure  [facility] placement and PEG placement). Pharmacy consulted to bridge with  IV heparin pending surgical plans. Last dose Eliquis 9/23 at 0900.    Heparin level therapeutic this AM CBC stable   Goal of Therapy:  Heparin level 0.3-0.7 units/ml Monitor platelets by anticoagulation protocol: Yes   Plan:  Continue heparin drip to 1150 units/hr Daily heparin level and CBC Monitor for s/sx bleeding F/u restart apixaban    Thank you Anette Guarneri, PharmD   Please check AMION for all [place] phone numbers After 10:00 PM, call [place]

## 2021-07-29 NOTE — Progress Notes (Addendum)
[facility] PROGRESS NOTE  Amy [name] HKV:[id number] DOB: 1946/05/24 DOA: 05/19/2021 PCP: [name], MD  Status:  Remains inpatient appropriate because:Unsafe d/c plan, IV treatments appropriate due to intensity of illness or inability to take PO, and Inpatient level of care appropriate due to severity of illness  Dispo: The patient is from: Home              Anticipated d/c is to: SNF              Patient currently is medically stable to d/c.   Difficult to place patient Yes              Barriers to discharge: Dialysis patient with markedly decreased mobility-we will need to prove tolerance of sitting in chair for hemodialysis while here as well as tolerating sitting in wheelchair for up to 1 hour x 2 to replicate transport process.  Cannot discharge and proceed with outpatient hemodialysis unless this is accomplished.    Level of care: Med-Surg  Code Status: DNR Family communication: Spoke with patient's son on 9/29. He is very conflicted given the etiology of her dysphagia.  It was explained to him that she likely has a dementia that had been undiagnosed prior to admission and given this diagnosis it is doubtful she would ever recover her appetite/desire to eat.  I reminded him that this is a high risk surgery for her and could potentially cause more harm than expected good.  I discussed with him that he needs to take into account that she does not want the tube and the tube is not going improve her quality of life.  As per previous discussions we talked about not feeding her and waiting for the inevitable to occur and then stopping dialysis and focusing on comfort.  He states he needs some time to think about whether or not it is appropriate to place a tube and wishes to discuss with other family members.  I told him that was more than appropriate.  I also updated the surgical team and asked them to speak with the patient's son regarding risks of surgery.  They plan to  speak with his son on Monday, October 3 DVT prophylaxis: Heparin COVID vaccination status: [name] 02/26/2020, Pfizer 04/10/2021    HPI: 75 y.o. 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 Patient presented to the ED on 7/25 with dark stool.  On presentation, hemoglobin was 9.6; GI was consulted.  She underwent EGD which was normal and flexible sigmoidoscopy which showed nonbleeding distal rectal ulcers/stercoral ulcers.  Subsequently, GI signed off.  Neurology was also consulted for altered mental status; MRI brain was similar to prior MRI at [facility] with a large right MCA stroke.  EEG this admission unremarkable. Nephrology has been following for dialysis needs. PEG tube was desired by family but could not be placed because of overlying transverse colon.  General surgery was consulted for G-tube placement but patient does not want a G-tube and  was deemed to have capacity by psych team.  Subsequently after further more detailed evaluations and observation it was finally clarified that she lacked capacity.  Surgical team was consulted the fact that the patient lacks underlying dysphagia and even though she lacks capacity  continues to express a desire to not have a gastrostomy tube placed they are reluctant to proceed with surgery and have ordered follow-up SLP evaluation regarding dysphagia.  Subjective: Patient awake and alert remains confused.  RN at bedside administering medications through medication cup-patient swallowing in small portions but eventually completed  Objective: Vitals:   07/28/21 2008 07/29/21 0526  BP: 133/80 140/89  Pulse: (!) 103 (!) 101  Resp: 16 18  Temp: 98.3 F (36.8  C) 98.2 F (36.8 C)  SpO2: 100% 100%    Intake/Output Summary (Last 24 hours) at 07/29/2021 6761 Last data filed at 07/29/2021 0600 Gross per 24 hour  Intake 1247.59 ml  Output 2500 ml  Net -1252.41 ml   Filed Weights    Exam:  Constitutional: NAD, awake less interactive than yesterday Respiratory: Lungs CTA and decreased in both bases, stable on room air, pulse oximetry  100% Cardiovascular: Heart sounds S1-S2, stable chronic upper extremity edema, normotensive  Abdomen: Soft and nondistended with normoactive bowel sounds.[name] LBM 10/03-still not eating -drinking very small amounts of liquids Neurologic: CN 2-12 grossly intact -Strength 1/5 on the right-unable to use her right arm with purpose due to significant dysmetria. Chronic left hemiparesis from prior CVA with associated left-sided neglect Psychiatric: Awake and alert; but remains oriented to name only.  DOES NOT HAVE CAPACITY.  Assessment/Plan: Acute problems: Leukocytosis WBCs trending upward but no fever CRP elevated at 11.6-ESR pending Not hypoxemic but chest x-ray reveals new right lower lobe pleural effusion adjacent airspace disease vs infection-no changes in upper right lobe so do not suspect aspiration-discussed with attending and will begin broad-spectrum antibiotics with vancomycin and cefepime.  If nasal PCR for MRSA negative can discontinue vancomycin.  We will also trend WBCs with CBC differential daily Blood cultures are pending Sacral wound unremarkable, left [facility] cath site unremarkable  Failure to thrive /Generalized deconditioning  /very poor oral intake/ After multiple evaluations by the psychiatric team it was determined the patient lacked capacity to make medical or other life decisions. Conversation between team and son still pending.  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  Surmise this lady had dementia that was undetected prior to admission and given recent stroke and change in environment the dementia has worsened -Continue olanzapine at at bedtime  ESRD on HD Continue HD MWF  Hyperkalemia/hypophosphatemia Management per nephrology HD -supplementation as needed   Hypotension Blood pressure currently is stable on midodrine.  Constipation Patient was on chronic narcotics prior to admission and these remain in place Continue Chronulac daily  Type 2 diabetes mellitus Episodes of hypoglycemia Hemoglobin A1c 5.1.  Diet controlled at baseline  Chronic thrombus left upper extremity Continue heparin  Stage II mid coccyx pressure injury, present on admission Continue wound care 10/3 continues to have some purulent drainage so this will need to be evaluated more closely may require wound care nurse assessment    GERD Continue Protonix and Pepcid     Data Reviewed: Basic Metabolic Panel: Recent Labs  Lab  07/23/21 0616 07/25/21 0337 07/28/21 0630 07/29/21 0616  NA 130* 129* 126* 129*  K 3.8 3.6 4.0 3.8  CL 91* 91* 89* 93*  CO2 _0 GLUCOSE 94 88 107* 122*  BUN 25* 15 12 7*  CREATININE 3.49* 3.36* 3.62* 2.72*  CALCIUM 8.4* 8.4* 8.4* 8.4*  PHOS 3.9 3.3  --   --    Liver Function Tests: Recent Labs  Lab 07/25/21 0337 07/28/21 0630 07/29/21 0616  AST  --  15 17  ALT  --  8 9  ALKPHOS  --  88 93  BILITOT  --  0.3 0.4  PROT  --  6.1* 5.8*  ALBUMIN 2.0* 1.9* 2.0*   No results for input(s): LIPASE,  AMYLASE in the last 168 hours. No results for input(s): AMMONIA in the last 168 hours. CBC: Recent Labs  Lab 07/25/21 0337 07/26/21 0320 07/27/21 0213 07/28/21 0630 07/29/21 0616  WBC 9.9 10.6* 12.0* 13.1* 17.1*  HGB 8.0* 9.0* 7.7* 8.6* 8.2*  HCT 26.8* 29.8* 25.9* 28.0* 26.6*  MCV 93.7 93.7 93.5 91.8 92.4  PLT 217 222 234 277 211    CBG: Recent Labs  Lab 07/28/21 0634 07/28/21 1146 07/28/21 1836 07/28/21 2103 07/29/21 0639  GLUCAP 103* 123* 154* 138* 124*     Scheduled Meds:  atorvastatin  40 mg Oral Daily   chlorhexidine  15 mL Mouth Rinse BID   Chlorhexidine Gluconate Cloth  6 each Topical Daily   darbepoetin (ARANESP) injection - DIALYSIS  200 mcg Intravenous Q Fri-HD   feeding supplement  237 mL Oral BID BM   gabapentin  100 mg Oral Q12H   insulin aspart  0-6 Units Subcutaneous TID AC & HS   lactulose  20 g Oral Daily   mouth rinse  15 mL Mouth Rinse q12n4p   midodrine  10 mg Oral TID WC   mirtazapine  7.5 mg Oral QHS   multivitamin  1 tablet Oral QHS   OLANZapine zydis  2.5 mg Oral QHS   pantoprazole sodium  40 mg Oral Daily   sodium chloride flush  10-40 mL Intracatheter Q12H   thiamine  100 mg Oral Daily   venlafaxine  37.5 mg Oral BID   Continuous Infusions:  dextrose 1,000 mL (07/28/21 2001)   heparin 1,150 Units/hr (07/28/21 1457)    Principal Problem:   GI bleed Active Problems:   Essential hypertension   Type 2 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, with long-term current use of insulin (HCC)   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   ESRD on dialysis (HCC)   Dyslipidemia   Hypokalemia   Stage 2 skin ulcer of sacral region ([place])   Cerebral thrombosis with cerebral infarction   Protein-calorie malnutrition, severe   Acute GI bleeding   Oropharyngeal dysphagia   Failure to thrive in adult   Inadequate oral intake   Clogged feeding tube   Fever   Rectal bleeding   Delirium due to another  medical condition   Neurogenic dysphagia   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

## 2021-07-29 NOTE — Progress Notes (Signed)
No family at bedside with outside food to feed the patient.

## 2021-07-29 NOTE — TOC Progression Note (Signed)
Transition of Care [facility]) - Progression Note    Patient Details  Name: ARI BEITER MRN: [id number] Date of Birth: 10/21/1946  Transition of Care [facility]) CM/SW [place], RN Phone Number: 07/29/2021, 1:28 PM  Clinical Narrative:    CM and MSW with DTP continue to follow the patient for disposition needs.  Patient continues to be followed for medical needs during her hospitalization.  The general surgery team was unable to reach out to the son to discuss surgical intervention for possible PEG placement.  The patient does not have capacity.  Plans for transitions of care needs at this time are undetermined since the patient continues to need HD and possible placement for PEG placement.    CM and MSW with DTP Team will continue to follow the patient for disposition needs and/or discussions regarding possible comfort care measures if determined by the medical team and/or family.   Expected Discharge Plan:  (To be determined.) Barriers to Discharge: Continued Medical Work up  Expected Discharge Plan and Services Expected Discharge Plan:  (To be determined.) In-house Referral: Clinical Social Work     Living arrangements for the past 2 months: [place] (Patient came to hospital from SNF [facility])                                       Social Determinants of Health (SDOH) Interventions    Readmission Risk Interventions No flowsheet data found.

## 2021-07-29 NOTE — Progress Notes (Signed)
[facility] Progress Note   Subjective:   Seen in room - looks comfortable, denies CP/dyspnea. Remains in IV heparin while there are ongoing discussions for PEG for nutrition.  Objective Vitals:   07/28/21 1837 07/28/21 2008 07/29/21 0526 07/29/21 0919  BP: 113/63 133/80 140/89 135/70  Pulse: (!) 107 (!) 103 (!) 101 95  Resp: '17 16 18   '$ Temp: 98.1 F (36.7 C) 98.3 F (36.8 C) 98.2 F (36.8 C) 98.1 F (36.7 C)  TempSrc: Oral Oral Oral Oral  SpO2: 100% 100% 100% 98%  Height:       Physical Exam General: Chronically ill appearing woman, NAD. Room air. Heart: RRR; 2/6 murmur Lungs: CTA anteriorly Abdomen: soft Extremities: Trace hip edema, chest wall and UE edema improving Dialysis Access: [facility]  Additional Objective Labs: Basic Metabolic Panel: Recent Labs  Lab 07/23/21 0616 07/25/21 0337 07/28/21 0630 07/29/21 0616  NA 130* 129* 126* 129*  K 3.8 3.6 4.0 3.8  CL 91* 91* 89* 93*  CO2 '29 26 27 28  '$ GLUCOSE 94 88 107* 122*  BUN 25* 15 12 7*  CREATININE 3.49* 3.36* 3.62* 2.72*  CALCIUM 8.4* 8.4* 8.4* 8.4*  PHOS 3.9 3.3  --   --    Liver Function Tests: Recent Labs  Lab 07/25/21 0337 07/28/21 0630 07/29/21 0616  AST  --  15 17  ALT  --  8 9  ALKPHOS  --  88 93  BILITOT  --  0.3 0.4  PROT  --  6.1* 5.8*  ALBUMIN 2.0* 1.9* 2.0*   CBC: Recent Labs  Lab 07/25/21 0337 07/26/21 0320 07/27/21 0213 07/28/21 0630 07/29/21 0616  WBC 9.9 10.6* 12.0* 13.1* 17.1*  HGB 8.0* 9.0* 7.7* 8.6* 8.2*  HCT 26.8* 29.8* 25.9* 28.0* 26.6*  MCV 93.7 93.7 93.5 91.8 92.4  PLT 217 222 234 277 211   Medications:  dextrose 1,000 mL (07/28/21 2001)   heparin 1,150 Units/hr (07/28/21 1457)    atorvastatin  40 mg Oral Daily   chlorhexidine  15 mL Mouth Rinse BID   Chlorhexidine Gluconate Cloth  6 each Topical Daily   darbepoetin (ARANESP) injection - DIALYSIS  200 mcg Intravenous Q Fri-HD   feeding supplement  237 mL Oral BID BM   gabapentin  100 mg Oral Q12H    insulin aspart  0-6 Units Subcutaneous TID AC & HS   lactulose  20 g Oral Daily   mouth rinse  15 mL Mouth Rinse q12n4p   midodrine  10 mg Oral TID WC   mirtazapine  7.5 mg Oral QHS   multivitamin  1 tablet Oral QHS   OLANZapine zydis  2.5 mg Oral QHS   pantoprazole sodium  40 mg Oral Daily   sodium chloride flush  10-40 mL Intracatheter Q12H   thiamine  100 mg Oral Daily   venlafaxine  37.5 mg Oral BID    Dialysis Orders: OP AF MWF  3h 25mn 400/500 62.5kg 3K/2.5 Ca P2 AVG HeRO   Hep none - Mircera 50 q 2 wks  (7/13)  Venofer 50 q wk - Parsabiv '5mg'$  [facility]   Assessment/Plan: AMS/FTT: Mental status that waxes/wanes.  HD recliner consistently for past 2 weeks - tentatively accepted back to outpatient HD unit, needs SNF. Melena/Rectal bleeding - GI consulted. Hx rectal ulcers.  EGD 7/27 normal, flex sig with mucosal ulceration c/w sterocoral ulcers, non bleeding. Hgb 8s, stable. ESRD: Back to usual MWF schedule - next HD  tomorrow. 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 AVG clotted 06/27/21, unable to be declotted d/t arm contracture. S/P conversion to tunneled HD catheter 07/02/21.  On IV heparin until decision made about PEG. Hypertension/volume: BP better today, on midodrine '10mg'$  TID with some overload on exam.  Order to hold midodrine on non-HD days with systolic over [id number]. UF as tolerated. Anemia of ESRD: Hgb 8.2, continue Aranesp 242mg q Fri. MBD: Ca/Phos ok. No binder or VDRA needed Nutrition: Alb very low. CoreTrac removed, continue to encourage full meals and PO supplements. 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 Psych eval from 9/14 determined that patient has functional mental capacity for medical decision-making. Psych re-consulted 9/23 to evaluate the patient again - at this point, patient does not have the capacity to make medical decisions. Ongoing discussions with patient's  son continues. Dispo: DNR/DNI. Poor prognosis. Although patient is currently tolerating being in the chair for HD, we do not believe dialysis is adding to her quality of life. Appreciate palliative care involvement.  KVeneta Penton PA-C 07/29/2021, 11:01 AM  [employer]

## 2021-07-30 DIAGNOSIS — R1319 Other dysphagia: Secondary | ICD-10-CM | POA: Diagnosis not present

## 2021-07-30 DIAGNOSIS — J189 Pneumonia, unspecified organism: Secondary | ICD-10-CM | POA: Diagnosis not present

## 2021-07-30 DIAGNOSIS — F0280 Dementia in other diseases classified elsewhere without behavioral disturbance: Secondary | ICD-10-CM | POA: Diagnosis not present

## 2021-07-30 DIAGNOSIS — R627 Adult failure to thrive: Secondary | ICD-10-CM | POA: Diagnosis not present

## 2021-07-30 DIAGNOSIS — A4189 Other specified sepsis: Secondary | ICD-10-CM

## 2021-07-30 DIAGNOSIS — I6330 Cerebral infarction due to thrombosis of unspecified cerebral artery: Secondary | ICD-10-CM | POA: Diagnosis not present

## 2021-07-30 LAB — CBC
HCT: 26.6 % — ABNORMAL LOW (ref 36.0–46.0)
Hemoglobin: 7.9 g/dL — ABNORMAL LOW (ref 12.0–15.0)
MCH: 27.6 pg (ref 26.0–34.0)
MCHC: 29.7 g/dL — ABNORMAL LOW (ref 30.0–36.0)
MCV: 93 fL (ref 80.0–100.0)
Platelets: 225 10*3/uL (ref 150–400)
RBC: 2.86 MIL/uL — ABNORMAL LOW (ref 3.87–5.11)
RDW: 17 % — ABNORMAL HIGH (ref 11.5–15.5)
WBC: 19.9 10*3/uL — ABNORMAL HIGH (ref 4.0–10.5)
nRBC: 0 % (ref 0.0–0.2)

## 2021-07-30 LAB — BLOOD CULTURE ID PANEL (REFLEXED) - BCID2

## 2021-07-30 LAB — COMPREHENSIVE METABOLIC PANEL
ALT: 9 U/L (ref 0–44)
AST: 15 U/L (ref 15–41)
Albumin: 1.8 g/dL — ABNORMAL LOW (ref 3.5–5.0)
Alkaline Phosphatase: 102 U/L (ref 38–126)
Anion gap: 9 (ref 5–15)
BUN: 11 mg/dL (ref 8–23)
CO2: 27 mmol/L (ref 22–32)
Calcium: 8.2 mg/dL — ABNORMAL LOW (ref 8.9–10.3)
Chloride: 90 mmol/L — ABNORMAL LOW (ref 98–111)
Creatinine, Ser: 3.27 mg/dL — ABNORMAL HIGH (ref 0.44–1.00)
GFR, Estimated: 14 mL/min — ABNORMAL LOW (ref >60)
Glucose, Bld: 163 mg/dL — ABNORMAL HIGH (ref 70–99)
Potassium: 4.1 mmol/L (ref 3.5–5.1)
Sodium: 126 mmol/L — ABNORMAL LOW (ref 135–145)
Total Bilirubin: 0.6 mg/dL (ref 0.3–1.2)
Total Protein: 5.7 g/dL — ABNORMAL LOW (ref 6.5–8.1)

## 2021-07-30 LAB — GLUCOSE, CAPILLARY
Glucose-Capillary: 111 mg/dL — ABNORMAL HIGH (ref 70–99)
Glucose-Capillary: 146 mg/dL — ABNORMAL HIGH (ref 70–99)
Glucose-Capillary: 137 mg/dL — ABNORMAL HIGH (ref 70–99)
Glucose-Capillary: 167 mg/dL — ABNORMAL HIGH (ref 70–99)

## 2021-07-30 LAB — HEPARIN LEVEL (UNFRACTIONATED): Heparin Unfractionated: 0.52 IU/mL (ref 0.30–0.70)

## 2021-07-30 MED ORDER — IPRATROPIUM-ALBUTEROL 0.5-2.5 (3) MG/3ML IN SOLN: 3.0000 mL | Freq: Four times a day (QID) | RESPIRATORY_TRACT | Status: DC | PRN

## 2021-07-30 MED ORDER — HEPARIN SODIUM (PORCINE) 1000 UNIT/ML IJ SOLN
INTRAMUSCULAR | Status: AC
  Filled 2021-07-30: qty 4

## 2021-07-30 MED ORDER — IPRATROPIUM-ALBUTEROL 0.5-2.5 (3) MG/3ML IN SOLN: 3.0000 mL | Freq: Four times a day (QID) | RESPIRATORY_TRACT | Status: DC

## 2021-07-30 NOTE — Progress Notes (Signed)
Nutrition Follow-up  DOCUMENTATION CODES:  Severe malnutrition in context of chronic illness  INTERVENTION:  -Recommend family bring in food from outside hospital to feed pt -Continue Ensure Enlive po BID, each supplement provides 350 kcal and 20 grams of protein  -Continue Magic cup TID with meals, each supplement provides 290 kcal and 9 grams of protein   Monitor for results of [place] discussions. If placement remains within [place], continue TF via G-tube once placed and ready to use: -Osmolite 1.5 @ 71m/hr, advance by 158mhr Q8H until goal of 5042mr (1200m87m is reached -45ml58msource TF daily Provides 1840 kcals, 86g protein, 914ml 50m water  NUTRITION DIAGNOSIS:  Severe Malnutrition related to chronic illness (ESRD on HD, prior CVA) as evidenced by percent weight loss, severe muscle depletion, energy intake < or equal to 75% for > or equal to 1 month. -- ongoing  GOAL:  Patient will meet greater than or equal to 90% of their needs -- not met  MONITOR:  PO intake, Supplement acceptance, Diet advancement, Labs, Weight trends, Skin, I & O's  REASON FOR ASSESSMENT:  Consult Enteral/tube feeding initiation and management  ASSESSMENT:  75 yo 73male with PMH of ESRD on HD MWF, HTN, HLD, T2DM, CVA s/p TPA 02/2021, ?blood clot in UE, left hemiparesis from CVA who presented to ER for dark stool with + fecal occult.  8/17 - cortrak placed (gastric tip) 8/19 - cortrak replaced (gastric tip) 8/29 - cortrak replaced (gastric tip) 9/07 - s/p tunneled HD cath placement 9/12 - cortrak replaced (gastric tip confirmed via xray); diet advanced to regular 9/23 - cortrak removed  Pt in HD at time of RD visit. Discussed pt with NP. Pt initiated on IV abx yesterday for possible new PNA and now with possible bacteremia. Pt also with some drainage from sacral wound. Edema worse today. NP reports pt's son has yet to make a decision regarding goals of care. Per NP, pt is to give decision tomorrow. Will  monitor for results of discussion. If son wishes for pt to continue receiving full scope of treatment, then pt will need G-tube placement ASAP given continued refusal of POs and malnutrition. Of note, Nephrology stated that although pt is now tolerating HD in chair, unsure HD is contributing to pt's QOL given pt's poor prognosis.   Medications: Scheduled Meds:  atorvastatin  40 mg Oral Daily   chlorhexidine  15 mL Mouth Rinse BID   Chlorhexidine Gluconate Cloth  6 each Topical Daily   darbepoetin (ARANESP) injection - DIALYSIS  200 mcg Intravenous Q Fri-HD   feeding supplement  237 mL Oral BID BM   gabapentin  100 mg Oral Q12H   insulin aspart  0-6 Units Subcutaneous TID AC & HS   ipratropium-albuterol  3 mL Nebulization Q6H   lactulose  20 g Oral Daily   mouth rinse  15 mL Mouth Rinse q12n4p   midodrine  10 mg Oral TID WC   mirtazapine  7.5 mg Oral QHS   multivitamin  1 tablet Oral QHS   OLANZapine zydis  2.5 mg Oral QHS   pantoprazole sodium  40 mg Oral Daily   sodium chloride flush  10-40 mL Intracatheter Q12H   thiamine  100 mg Oral Daily   venlafaxine  37.5 mg Oral BID  Continuous Infusions:  ceFEPime (MAXIPIME) IV 1 g (07/29/21 1813)   dextrose 40 mL/hr at 07/29/21 2134   heparin 1,150 Units/hr (07/29/21 1351)   vancomycin 750 mg (07/30/21 1136)   Labs: Recent  Labs  Lab 07/25/21 0337 07/28/21 0630 07/29/21 0616 07/30/21 0300  NA 129* 126* 129* 126*  K 3.6 4.0 3.8 4.1  CL 91* 89* 93* 90*  CO2 26 27 28 27   BUN 15 12 7* 11  CREATININE 3.36* 3.62* 2.72* 3.27*  CALCIUM 8.4* 8.4* 8.4* 8.2*  PHOS 3.3  --   --   --   GLUCOSE 88 107* 122* 163*  CBGs 446-520T61 hours  Last HD 10/03, 2.5L net UF No UOP documented x24 hours I/O: +9.6L since admit  RN edema assessment notes moderate pitting edema to BUE  Diet Order:   Diet Order             Diet regular Room service appropriate? No; Fluid consistency: Thin  Diet effective now                  EDUCATION NEEDS:   Education needs have been addressed  Skin:  Skin Assessment: Skin Integrity Issues: Skin Integrity Issues:: Stage II Stage II: Pressure Injury on coccyx; Open wound on buttocks  Last BM:  10/3  Height:  Ht Readings from Last 1 Encounters:  05/20/21 5' 7"  (1.702 m)   Weight:  Wt Readings from Last 1 Encounters:  05/01/21 68 kg   BMI:  Body mass index is 23.62 kg/m.  Estimated Nutritional Needs:  Kcal:  1700-1900 Protein:  85-100 grams Fluid:  1L +UOP    Amy Ina, MS, RD, LDN (she/her/hers) RD pager number and weekend/on-call pager number located in [place].

## 2021-07-30 NOTE — Progress Notes (Signed)
[facility] PROGRESS NOTE  MACKYNZIE WOOLFORD HKV:[id number] DOB: 1946/05/24 DOA: 05/19/2021 PCP: [name], MD  Status:  Remains inpatient appropriate because:Unsafe d/c plan, IV treatments appropriate due to intensity of illness or inability to take PO, and Inpatient level of care appropriate due to severity of illness  Dispo: The patient is from: Home              Anticipated d/c is to: SNF              Patient currently is medically stable to d/c.   Difficult to place patient Yes              Barriers to discharge: Dialysis patient with markedly decreased mobility-we will need to prove tolerance of sitting in chair for hemodialysis while here as well as tolerating sitting in wheelchair for up to 1 hour x 2 to replicate transport process.  Cannot discharge and proceed with outpatient hemodialysis unless this is accomplished.    Level of care: Med-Surg  Code Status: DNR Family communication: Spoke with patient's son on 9/29. He is very conflicted given the etiology of her dysphagia.  It was explained to him that she likely has a dementia that had been undiagnosed prior to admission and given this diagnosis it is doubtful she would ever recover her appetite/desire to eat.  I reminded him that this is a high risk surgery for her and could potentially cause more harm than expected good.  I discussed with him that he needs to take into account that she does not want the tube and the tube is not going improve her quality of life.  As per previous discussions we talked about not feeding her and waiting for the inevitable to occur and then stopping dialysis and focusing on comfort.  He states he needs some time to think about whether or not it is appropriate to place a tube and wishes to discuss with other family members.  I told him that was more than appropriate.  I also updated the surgical team and asked them to speak with the patient's son regarding risks of surgery.  They plan to  speak with his son on Monday, October 3 DVT prophylaxis: Heparin COVID vaccination status: [name] 02/26/2020, Pfizer 04/10/2021    HPI: 75 y.o. 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 Patient presented to the ED on 7/25 with dark stool.  On presentation, hemoglobin was 9.6; GI was consulted.  She underwent EGD which was normal and flexible sigmoidoscopy which showed nonbleeding distal rectal ulcers/stercoral ulcers.  Subsequently, GI signed off.  Neurology was also consulted for altered mental status; MRI brain was similar to prior MRI at [facility] with a large right MCA stroke.  EEG this admission unremarkable. Nephrology has been following for dialysis needs. PEG tube was desired by family but could not be placed because of overlying transverse colon.  General surgery was consulted for G-tube placement but patient does not want a G-tube and  was deemed to have capacity by psych team.  Subsequently after further more detailed evaluations and observation it was finally clarified that she lacked capacity.  Surgical team was consulted the fact that the patient lacks underlying dysphagia and even though she lacks capacity  continues to express a desire to not have a gastrostomy tube placed they are reluctant to proceed with surgery and have ordered follow-up SLP evaluation regarding dysphagia.  Subjective: Examined during dialysis.  Awake and oriented x3 although was unable to tell me that she was in hemodialysis but was able to tell me she was at [place] to state that she does not want a feeding tube.  Objective: Vitals:   07/29/21 2123 07/30/21 0443  BP: (!) 156/66 139/89  Pulse:  96 (!) 107  Resp: 16 17  Temp: 98.2 F (36.8 C) 99.1 F (37.3 C)  SpO2: 100% 100%    Intake/Output Summary (Last 24 hours) at 07/30/2021 0813 Last data filed at 07/30/2021 0600 Gross per 24 hour  Intake 1957.33 ml  Output 0 ml  Net 1957.33 ml   Filed Weights    Exam:  Constitutional: NAD, calm and pleasant Respiratory: Anterior lung sounds are clear and diminished in the bases bilaterally.  Unable to examine posteriorly given she is currently on hemodialysis.  Posterior lung exam also limited by patient's body habitus unless she is rolled on her side.  Currently stable on room air with pulse oximetry 100% Cardiovascular: S1-S2, tachycardic with rates between 115 and 118, normotensive prior to dialysis Abdomen: LBM 10/03-still not eating.  Abdomen soft nontender nondistended.  On D10 IV fluid until decision can be made regarding G-tube placement Neurologic: CN 2-12 grossly intact -Strength 1/5 on the right-unable to use her right arm with purpose due to significant dysmetria. 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  DOES NOT HAVE CAPACITY.  Assessment/Plan: Acute problems: Sepsis physiology/RLL HCAP/? bacteremia WBCs trend: 10.6 > 12.0 > 13.1 > 17.1 > 19.9 CRP elevated at 11.6-ESR 37 As of 10/5 had a low-grade fever in the a.m. of 99.5 Not hypoxemic but chest x-ray reveals new right lower lobe pleural effusion adjacent airspace disease vs infection-WBC trending up- continue Vanco and Cefepime-TM 99.1 (MRSA PCR neg) Blood cultures 1/2 + methicillin-resistant staph epidermis-suspect is contaminant but will wait on final cultures Sacral wound unremarkable, left TDC cath site unremarkable  Failure to thrive /Generalized deconditioning  /very poor oral intake/ After multiple evaluations by the psychiatric team it was determined the patient lacked capacity to make  medical or other life decisions. Conversation between team and son still pending.  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  Surmise this lady had dementia that was undetected prior to admission and given recent stroke and change in environment the dementia has worsened -Continue olanzapine at at bedtime  ESRD on HD Continue HD MWF  Hyperkalemia/hypophosphatemia Management per nephrology HD -supplementation as needed   Hypotension Blood pressure currently is stable on midodrine.  Constipation Patient was on chronic narcotics prior to admission and these  remain in place Continue Chronulac daily  Type 2 diabetes mellitus Episodes of hypoglycemia Hemoglobin A1c 5.1.  Diet controlled at baseline  Chronic thrombus left upper extremity Continue heparin  Stage II mid coccyx pressure injury, present on admission Continue wound care 10/3 continues to have some purulent drainage so this will need to be evaluated more closely may require wound care nurse assessment    GERD Continue Protonix and Pepcid     Data Reviewed: Basic Metabolic Panel: Recent Labs  Lab 07/25/21 0337 07/28/21 0630 07/29/21 0616 07/30/21 0300  NA 129* 126* 129* 126*  K 3.6 4.0 3.8 4.1  CL 91* 89* 93* 90*  CO2  $Re'26 27 28 27  'gah$ GLUCOSE 88 107* 122* 163*  BUN 15 12 7* 11  CREATININE 3.36* 3.62* 2.72* 3.27*  CALCIUM 8.4* 8.4* 8.4* 8.2*  PHOS 3.3  --   --   --    Liver Function Tests: Recent Labs  Lab 07/25/21 0337 07/28/21 0630 07/29/21 0616 07/30/21 0300  AST  --  $R'15 17 15  'HE$ ALT  --  $R'8 9 9  'nf$ ALKPHOS  --  88 93 102  BILITOT  --  0.3 0.4 0.6  PROT  --  6.1* 5.8* 5.7*  ALBUMIN 2.0* 1.9* 2.0* 1.8*   No results for input(s): LIPASE, AMYLASE in the last 168 hours. No results for input(s): AMMONIA in the last 168 hours. CBC: Recent Labs  Lab 07/26/21 0320 07/27/21 0213 07/28/21 0630 07/29/21 0616 07/30/21 0300  WBC 10.6* 12.0* 13.1* 17.1* 19.9*  HGB 9.0* 7.7* 8.6* 8.2* 7.9*  HCT 29.8* 25.9* 28.0* 26.6* 26.6*  MCV 93.7 93.5 91.8 92.4 93.0  PLT 222 234 277 211 225    CBG: Recent Labs  Lab 07/29/21 0639 07/29/21 1128 07/29/21 1631 07/29/21 2124 07/30/21 0645  GLUCAP 124* 144* 132* 140* 111*     Scheduled Meds:  atorvastatin  40 mg Oral Daily   chlorhexidine  15 mL Mouth Rinse BID   Chlorhexidine Gluconate Cloth  6 each Topical Daily   darbepoetin (ARANESP) injection - DIALYSIS  200 mcg Intravenous Q Fri-HD   feeding supplement  237 mL Oral BID BM   gabapentin  100 mg Oral Q12H   insulin aspart  0-6 Units Subcutaneous TID AC & HS   lactulose  20 g Oral Daily   mouth rinse  15 mL Mouth Rinse q12n4p   midodrine  10 mg Oral TID WC   mirtazapine  7.5 mg Oral QHS   multivitamin  1 tablet Oral QHS   OLANZapine zydis  2.5 mg Oral QHS   pantoprazole sodium  40 mg Oral Daily   sodium chloride flush  10-40 mL Intracatheter Q12H   thiamine  100 mg Oral Daily   venlafaxine  37.5 mg Oral BID   Continuous Infusions:  ceFEPime (MAXIPIME) IV 1 g (07/29/21 1813)   dextrose 40 mL/hr at 07/29/21 2134   heparin 1,150 Units/hr (07/29/21 1351)   vancomycin      Principal Problem:   GI bleed Active Problems:   Essential hypertension   Type 2 diabetes mellitus with chronic kidney  disease on chronic dialysis, with long-term current use of insulin (HCC)   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   ESRD on dialysis ([place])   Dyslipidemia   Hypokalemia   Stage 2 skin ulcer of sacral region ([place])   Cerebral thrombosis with cerebral infarction   Protein-calorie malnutrition, severe   Acute GI bleeding   Oropharyngeal dysphagia  Failure to thrive in adult   Inadequate oral intake   Clogged feeding tube   Fever   Rectal bleeding   Delirium due to another medical condition   Neurogenic dysphagia   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

## 2021-07-30 NOTE — Progress Notes (Signed)
PHARMACY - PHYSICIAN COMMUNICATION CRITICAL VALUE ALERT - BLOOD CULTURE IDENTIFICATION (BCID)  Amy [name] is an 75 y.o. female who presented to [facility] on 05/19/2021 with a chief complaint of pneumonia.  Assessment:   Bcx: 1/4 bottles with GPC in clusters BCID detected Staphylococcus Epidermidis, mecA detected Anaerobic bottle only  Name of physician (or Provider) Contacted: Dr. Reesa Chew  Current antibiotics:  Vanc 750 mg IV MWF Cefepime 1g IV q24h  Changes to prescribed antibiotics recommended:  Staph Epi in 1/4 likely represents a contaminant. No additional antibiotics indicated.   Results for orders placed or performed during the hospital encounter of 05/19/21  Blood Culture ID Panel (Reflexed) (Collected: 07/29/2021  9:02 AM)  Result Value Ref Range   Enterococcus faecalis NOT DETECTED NOT DETECTED   Enterococcus Faecium NOT DETECTED NOT DETECTED   Listeria monocytogenes NOT DETECTED NOT DETECTED   Staphylococcus species DETECTED (A) NOT DETECTED   Staphylococcus aureus (BCID) NOT DETECTED NOT DETECTED   Staphylococcus epidermidis DETECTED (A) NOT DETECTED   Staphylococcus lugdunensis NOT DETECTED NOT DETECTED   Streptococcus species NOT DETECTED NOT DETECTED   Streptococcus agalactiae NOT DETECTED NOT DETECTED   Streptococcus pneumoniae NOT DETECTED NOT DETECTED   Streptococcus pyogenes NOT DETECTED NOT DETECTED   A.calcoaceticus-baumannii NOT DETECTED NOT DETECTED   Bacteroides fragilis NOT DETECTED NOT DETECTED   Enterobacterales NOT DETECTED NOT DETECTED   Enterobacter cloacae complex NOT DETECTED NOT DETECTED   Escherichia coli NOT DETECTED NOT DETECTED   Klebsiella aerogenes NOT DETECTED NOT DETECTED   Klebsiella oxytoca NOT DETECTED NOT DETECTED   Klebsiella pneumoniae NOT DETECTED NOT DETECTED   Proteus species NOT DETECTED NOT DETECTED   Salmonella species NOT DETECTED NOT DETECTED   Serratia marcescens NOT DETECTED NOT DETECTED   Haemophilus influenzae  NOT DETECTED NOT DETECTED   Neisseria meningitidis NOT DETECTED NOT DETECTED   Pseudomonas aeruginosa NOT DETECTED NOT DETECTED   Stenotrophomonas maltophilia NOT DETECTED NOT DETECTED   Candida albicans NOT DETECTED NOT DETECTED   Candida auris NOT DETECTED NOT DETECTED   Candida glabrata NOT DETECTED NOT DETECTED   Candida krusei NOT DETECTED NOT DETECTED   Candida parapsilosis NOT DETECTED NOT DETECTED   Candida tropicalis NOT DETECTED NOT DETECTED   Cryptococcus neoformans/gattii NOT DETECTED NOT DETECTED   Methicillin resistance mecA/C DETECTED (A) NOT DETECTED    Lestine Box, PharmD PGY2 Infectious Diseases Pharmacy Resident   Please check [email or web address] for unit-specific pharmacy phone numbers

## 2021-07-30 NOTE — Progress Notes (Signed)
ANTICOAGULATION CONSULT NOTE - Follow Up Consult  Pharmacy Consult for Heparin Indication:  hx CVA an chronic UE thrombus  Allergies  Allergen Reactions   Sensipar [Cinacalcet]     Per MAR   Statins     Per [facility]    Patient Measurements: Height: '5\' 7"'$  (170.2 cm) Weight:  (uto pt in recliner) IBW/kg (Calculated) : 61.6 - recorded weights widely variable 66.7kg - 105.4 kg Heparin Dosing Weight: 85 kg  Vital Signs: Temp: 99.1 F (37.3 C) (10/05 0443) BP: 139/89 (10/05 0443) Pulse Rate: 107 (10/05 0443)  Labs: Recent Labs    07/28/21 0630 07/29/21 0616 07/30/21 0300  HGB 8.6* 8.2* 7.9*  HCT 28.0* 26.6* 26.6*  PLT 277 211 225  HEPARINUNFRC 0.59 0.59 0.52  CREATININE 3.62* 2.72* 3.27*    ESRD  Assessment: 75 y.o. female with hx CVA and chronic UE thrombus,  on apixaban prior to admission.  Patient with prolonged hospitalization and difficult disposition.  Family has elected to pursue PEG tube placement for ongoing nutritional needs.  Surgery consulted, see MD progress notes indicating surgery reluctant to place PEG.   Eliquis stopped due to potential for procedure  [facility] placement and PEG placement). Pharmacy consulted to bridge with  IV heparin pending surgical plans. Last dose Eliquis 9/23 at 0900.    Heparin level therapeutic this AM CBC stable   Goal of Therapy:  Heparin level 0.3-0.7 units/ml Monitor platelets by anticoagulation protocol: Yes   Plan:  Continue heparin drip to 1150 units/hr Daily heparin level and CBC Monitor for s/sx bleeding F/u restart apixaban    Thank you Zettie Pho Toniqua Melamed PharmD   Please check AMION for all [place] phone numbers After 10:00 PM, call [place]

## 2021-07-30 NOTE — Progress Notes (Signed)
Pt off the unit to hemo 

## 2021-07-30 NOTE — Progress Notes (Addendum)
No family at the bedside with outside food to feed the pt. For lunch Pt didn't want hospital food when it was offered to her.

## 2021-07-30 NOTE — Progress Notes (Signed)
Patient seen and examined at bedside, she is sitting up in recliner this morning and answers basic questions.  No complaints.  Still has minimal p.o. intake.  Remains afebrile overnight.   Vital signs are stable. On physical exam she is not any acute distress.  Clear to auscultation bilaterally.  Abdomen is nontender nondistended, left upper extremity is contracted.  Strength in right upper extremity is 3/5.  She is alert and awake but oriented only to name.  Her blood cultures are showing gram-positive cocci which I suspect to be contaminated but will need to continue to monitor this.  WBC is slowly tracking up  For now we will continue vancomycin and cefepime. There were some concerns of purulent discharge from her sacral/coccyx wound which was evaluated by wound care nursing staff.   Case discussed with Erin Hearing, NP & patient's RN  Gerlean Ren MD [facility]

## 2021-07-30 NOTE — Progress Notes (Signed)
[facility] Progress Note   Subjective:  Seen on HD - 3L UFG and tolerated. Remains on IV heparin and started on IV abx yesterday for possible new pneumonia and now with possible bacteremia (1of 4 Cx growing MRSE). Per notes also with some drainage from sacral wound. Also noted that L leg has significant edema compared to R leg today.  Objective Vitals:   07/30/21 0922 07/30/21 0935 07/30/21 1000 07/30/21 1030  BP: (!) 154/97 (!) 175/57 138/81 (!) 118/40  Pulse:      Resp: '16 17 16 15  '$ Temp: 98.2 F (36.8 C) 98.2 F (36.8 C)    TempSrc: Oral     SpO2:      Height:       Physical Exam General: Chronically ill appearing woman, NAD. Room air. Heart: RRR; 2/6 murmur Lungs: CTA anteriorly Abdomen: soft Extremities: 2+ LLE edema today, chest wall and UE edema improving Dialysis Access: [facility]  Additional Objective Labs: Basic Metabolic Panel: Recent Labs  Lab 07/25/21 0337 07/28/21 0630 07/29/21 0616 07/30/21 0300  NA 129* 126* 129* 126*  K 3.6 4.0 3.8 4.1  CL 91* 89* 93* 90*  CO2 '26 27 28 27  '$ GLUCOSE 88 107* 122* 163*  BUN 15 12 7* 11  CREATININE 3.36* 3.62* 2.72* 3.27*  CALCIUM 8.4* 8.4* 8.4* 8.2*  PHOS 3.3  --   --   --    Liver Function Tests: Recent Labs  Lab 07/28/21 0630 07/29/21 0616 07/30/21 0300  AST '15 17 15  '$ ALT '8 9 9  '$ ALKPHOS 88 93 102  BILITOT 0.3 0.4 0.6  PROT 6.1* 5.8* 5.7*  ALBUMIN 1.9* 2.0* 1.8*   CBC: Recent Labs  Lab 07/26/21 0320 07/27/21 0213 07/28/21 0630 07/29/21 0616 07/30/21 0300  WBC 10.6* 12.0* 13.1* 17.1* 19.9*  HGB 9.0* 7.7* 8.6* 8.2* 7.9*  HCT 29.8* 25.9* 28.0* 26.6* 26.6*  MCV 93.7 93.5 91.8 92.4 93.0  PLT 222 234 277 211 225   Studies/Results: DG CHEST PORT 1 VIEW  Result Date: 07/29/2021 CLINICAL DATA:  Leukocytosis, admitted for rectal bleed EXAM: PORTABLE CHEST 1 VIEW COMPARISON:  Chest radiograph 05/01/2021 FINDINGS: There is a left-sided tunneled central venous catheter in place terminating in the  region of the superior cavoatrial junction. The cardiomediastinal silhouette is grossly stable. There is unchanged significant asymmetric elevation of the right hemidiaphragm. There is a small right pleural effusion with adjacent airspace disease, new since 05/01/2021. The left lung is clear. There is no left effusion. There is no pneumothorax. The bones are stable. IMPRESSION: 1. New small right pleural effusion with adjacent airspace disease. Infection can not be excluded. 2. Unchanged significant asymmetric elevation of the right hemidiaphragm. Electronically Signed   By: Valetta Mole M.D.   On: 07/29/2021 11:45    Medications:  ceFEPime (MAXIPIME) IV 1 g (07/29/21 1813)   dextrose 40 mL/hr at 07/29/21 2134   heparin 1,150 Units/hr (07/29/21 1351)   vancomycin      atorvastatin  40 mg Oral Daily   chlorhexidine  15 mL Mouth Rinse BID   Chlorhexidine Gluconate Cloth  6 each Topical Daily   darbepoetin (ARANESP) injection - DIALYSIS  200 mcg Intravenous Q Fri-HD   feeding supplement  237 mL Oral BID BM   gabapentin  100 mg Oral Q12H   insulin aspart  0-6 Units Subcutaneous TID AC & HS   ipratropium-albuterol  3 mL Nebulization Q6H   lactulose  20 g Oral Daily   mouth rinse  15 mL Mouth Rinse q12n4p   midodrine  10 mg Oral TID WC   mirtazapine  7.5 mg Oral QHS   multivitamin  1 tablet Oral QHS   OLANZapine zydis  2.5 mg Oral QHS   pantoprazole sodium  40 mg Oral Daily   sodium chloride flush  10-40 mL Intracatheter Q12H   thiamine  100 mg Oral Daily   venlafaxine  37.5 mg Oral BID    Dialysis Orders: OP AF MWF  3h 34mn 400/500 62.5kg 3K/2.5 Ca P2 AVG HeRO   Hep none - Mircera 50 q 2 wks  (7/13)  Venofer 50 q wk - Parsabiv '5mg'$  [facility]   Assessment/Plan: AMS/FTT: Mental status that waxes/wanes.  HD recliner consistently for past 2 weeks - tentatively accepted back to outpatient HD unit, needs SNF. Melena/Rectal bleeding - GI consulted. Hx rectal ulcers.  EGD  7/27 normal, flex sig with mucosal ulceration c/w sterocoral ulcers, non bleeding. Hgb 8s, stable. ?Pneumonia/?Bacteremia: On IV abx - per primary. ESRD: Back to usual MWF schedule - for HD today. 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 AVG clotted 06/27/21, unable to be declotted d/t arm contracture. S/P conversion to tunneled HD catheter 07/02/21.  Hypertension/volume: BP variable, on midodrine '10mg'$  TID. More edema today - 3L UFG. Consider LE u/s if not improving, although unlikely to be DVT while on IV heparin. Anemia of ESRD: Hgb 7.9, continue Aranesp 2025m q Fri. MBD: CorrCa slightly high, Phos low. No binder or VDRA needed Nutrition: Alb very low. CoreTrac removed, continue to encourage full meals and PO supplements. 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 Psych eval from 9/14 determined that patient has functional mental capacity for medical decision-making. Psych re-consulted 9/23 to evaluate the patient again - at this point, patient does not have the capacity to make medical decisions. Ongoing discussions with patient's son continues. Dispo: DNR/DNI. Poor prognosis. Although patient is currently tolerating being in the chair for HD, we do not believe dialysis is adding to her quality of life. Appreciate palliative care involvement.    KaVeneta PentonPA-C 07/30/2021, 11:06 AM  [employer]

## 2021-07-30 NOTE — Plan of Care (Signed)
  Problem: Health Behavior/Discharge Planning: Goal: Ability to manage health-related needs will improve Outcome: Progressing   Problem: Clinical Measurements: Goal: Ability to maintain clinical measurements within normal limits will improve Outcome: Progressing   Problem: Activity: Goal: Risk for activity intolerance will decrease Outcome: Progressing   Problem: Nutrition: Goal: Adequate nutrition will be maintained Outcome: Not Progressing   Problem: Safety: Goal: Ability to remain free from injury will improve Outcome: Progressing   Problem: Skin Integrity: Goal: Risk for impaired skin integrity will decrease Outcome: Progressing

## 2021-07-30 NOTE — Progress Notes (Signed)
No family at bedside with outside food to feed the pt for dinner.

## 2021-07-30 NOTE — Progress Notes (Addendum)
Went to administer pt meds when she came back from dialysis. Pt started coughing and choking on the gabapentin. So I wasn't able to administer her other oral medications. Dr. Reesa Chew and Ebony Hail (NP) aware.

## 2021-07-31 DIAGNOSIS — J189 Pneumonia, unspecified organism: Secondary | ICD-10-CM | POA: Diagnosis not present

## 2021-07-31 DIAGNOSIS — I6330 Cerebral infarction due to thrombosis of unspecified cerebral artery: Secondary | ICD-10-CM | POA: Diagnosis not present

## 2021-07-31 DIAGNOSIS — R627 Adult failure to thrive: Secondary | ICD-10-CM | POA: Diagnosis not present

## 2021-07-31 DIAGNOSIS — F0280 Dementia in other diseases classified elsewhere without behavioral disturbance: Secondary | ICD-10-CM | POA: Diagnosis not present

## 2021-07-31 LAB — CBC
HCT: 24.6 % — ABNORMAL LOW (ref 36.0–46.0)
Hemoglobin: 7.5 g/dL — ABNORMAL LOW (ref 12.0–15.0)
MCH: 27.8 pg (ref 26.0–34.0)
MCHC: 30.5 g/dL (ref 30.0–36.0)
MCV: 91.1 fL (ref 80.0–100.0)
Platelets: 188 10*3/uL (ref 150–400)
RBC: 2.7 MIL/uL — ABNORMAL LOW (ref 3.87–5.11)
RDW: 16.9 % — ABNORMAL HIGH (ref 11.5–15.5)
WBC: 20.1 10*3/uL — ABNORMAL HIGH (ref 4.0–10.5)
nRBC: 0 % (ref 0.0–0.2)

## 2021-07-31 LAB — C-REACTIVE PROTEIN: CRP: 11.8 mg/dL — ABNORMAL HIGH (ref <1.0)

## 2021-07-31 LAB — SEDIMENTATION RATE: Sed Rate: 40 mm/hr — ABNORMAL HIGH (ref 0–22)

## 2021-07-31 LAB — GLUCOSE, CAPILLARY
Glucose-Capillary: 133 mg/dL — ABNORMAL HIGH (ref 70–99)
Glucose-Capillary: 148 mg/dL — ABNORMAL HIGH (ref 70–99)
Glucose-Capillary: 137 mg/dL — ABNORMAL HIGH (ref 70–99)
Glucose-Capillary: 133 mg/dL — ABNORMAL HIGH (ref 70–99)

## 2021-07-31 LAB — HEPARIN LEVEL (UNFRACTIONATED): Heparin Unfractionated: 0.4 IU/mL (ref 0.30–0.70)

## 2021-07-31 MED ORDER — PANTOPRAZOLE SODIUM 40 MG PO TBEC
40.0000 mg | DELAYED_RELEASE_TABLET | Freq: Every day | ORAL | Status: DC
  Administered 2021-08-01 – 2021-08-07 (×5): 40 mg via ORAL
  Filled 2021-07-31 (×5): qty 1

## 2021-07-31 NOTE — Progress Notes (Signed)
No family bedside to provide breakfast.

## 2021-07-31 NOTE — Progress Notes (Signed)
ANTICOAGULATION CONSULT NOTE - Follow Up Consult  Pharmacy Consult for Heparin Indication:  hx CVA and chronic UE thrombus  Allergies  Allergen Reactions   Sensipar [Cinacalcet]     Per MAR   Statins     Per [facility]    Patient Measurements: Height: '5\' 7"'$  (170.2 cm) Weight: 64.2 kg (141 lb 9.6 oz) IBW/kg (Calculated) : 61.6 - recorded weights widely variable 64.2 kg - 105.4 kg Heparin Dosing Weight: unclear  Vital Signs: Temp: 98.4 F (36.9 C) (10/06 0405) Temp Source: Oral (10/06 0405) BP: 159/58 (10/06 0405) Pulse Rate: 98 (10/06 0405)  Labs: Recent Labs    07/29/21 0616 07/30/21 0300 07/31/21 0339  HGB 8.2* 7.9* 7.5*  HCT 26.6* 26.6* 24.6*  PLT 211 225 188  HEPARINUNFRC 0.59 0.52 0.40  CREATININE 2.72* 3.27*  --     ESRD  Assessment: 75 y.o. female with hx CVA and chronic UE thrombus, on apixaban PTA.  Patient with prolonged hospitalization and difficult disposition.  Family has elected to pursue PEG tube placement for ongoing nutritional needs.  Surgery consulted, see MD progress notes indicating surgery reluctant to place PEG.   Eliquis held due to potential for procedure  [facility] placement and PEG placement). Pharmacy consulted to bridge with IV heparin pending surgical plans. Last dose Eliquis 9/23 at 0900.    Heparin level therapeutic at 0.4 this AM Hgb steadily trending down, f/u Aranesp due tomorrow.   Goal of Therapy:  Heparin level 0.3-0.7 units/ml Monitor platelets by anticoagulation protocol: Yes   Plan:  Continue heparin drip at 1150 units/hr Daily heparin level and CBC Monitor for s/sx bleeding F/u restart apixaban     Thank you for allowing [place] to participate in this patients care. Jens Som, PharmD 07/31/2021 9:22 AM  **Pharmacist phone directory can be found on [email or web address] listed under [place]**

## 2021-07-31 NOTE — TOC Progression Note (Signed)
Transition of Care [facility]) - Progression Note    Patient Details  Name: Amy [name] MRN: [id number] Date of Birth: 28-Aug-1946  Transition of Care [facility]) CM/SW [place], RN Phone Number: 07/31/2021, 11:45 AM  Clinical Narrative:    CM and MSW with DTP continue to follow the patient for transitions of care needs.  CM spoke with Erin Hearing, NP and she is waiting for return call and visit with the patient's son today at the bedside.     Expected Discharge Plan:  (To be determined.) Barriers to Discharge: Continued Medical Work up  Expected Discharge Plan and Services Expected Discharge Plan:  (To be determined.) In-house Referral: Clinical Social Work     Living arrangements for the past 2 months: [place] (Patient came to hospital from SNF [facility])                                       Social Determinants of Health (SDOH) Interventions    Readmission Risk Interventions No flowsheet data found.

## 2021-07-31 NOTE — Progress Notes (Addendum)
[facility] PROGRESS NOTE  Amy [name] [id number] DOB: Nov 12, 1945 DOA: 05/19/2021 PCP: [name], MD  Status:  Remains inpatient appropriate because:Unsafe d/c plan, IV treatments appropriate due to intensity of illness or inability to take PO, and Inpatient level of care appropriate due to severity of illness  Dispo: The patient is from: Home              Anticipated d/c is to: SNF              Patient currently is medically stable to d/c.   Difficult to place patient Yes              Barriers to discharge: Dialysis patient with markedly decreased mobility-we will need to prove tolerance of sitting in chair for hemodialysis while here as well as tolerating sitting in wheelchair for up to 1 hour x 2 to replicate transport process.  Cannot discharge and proceed with outpatient hemodialysis unless this is accomplished.    Level of care: Med-Surg  Code Status: DNR Family communication: Spoke with patient's son on 9/29.  DVT prophylaxis: Heparin COVID vaccination status: [name] 02/26/2020, Pfizer 04/10/2021    HPI: 75 y.o. female with PMH significant for ESRD on HD MWF, HTN, HLD, CVA s/p TPA 02/2021 with resultant left hemiparesis, partial blood clot upper extremity, recently hospitalized to [facility] on 03/13/2021-04/22/2021 during which patient underwent flexible sigmoidoscopy on 03/26/2021 which showed multiple rectal ulcers likely secondary to trauma from enemas and constipation. Patient presented to the ED on 7/25 with dark stool.  On presentation, hemoglobin was 9.6; GI was consulted.  She underwent EGD which was normal and flexible sigmoidoscopy which showed nonbleeding distal rectal ulcers/stercoral ulcers.  Subsequently, GI signed off.  Neurology was also consulted for altered mental status; MRI brain was similar to prior MRI at [facility] with a large right MCA stroke.  EEG this admission unremarkable. Nephrology has been following for dialysis needs. PEG tube was desired by  family but could not be placed because of overlying transverse colon.  General surgery was consulted for G-tube placement but patient does not want a G-tube and  was deemed to have capacity by psych team.  Subsequently after further more detailed evaluations and observation it was finally clarified that she lacked capacity.  Surgical team was consulted the fact that the patient lacks underlying dysphagia and even though she lacks capacity continues to express a desire to not have a gastrostomy tube placed they are reluctant to proceed with surgery and have ordered follow-up SLP evaluation regarding dysphagia.  **2 PM met at bedside with son Amy [name].  He has been updated on his mother's new finding of pneumonia with sepsis physiology.  He still is not quite emotionally ready to make a decision regarding placing a feeding tube or not placing one stating "I do not want to play God".  I reviewed the surgical note with him regarding the risks of surgery and the fact that placing a feeding tube would not improve her quality of life. I explained what quality of life is informing him that placing a tube will not change her underlying cognitive dysphagia and that she would be dependent on the tube for hydration and nutrition for the remainder of her life.  He verbalized understanding of this concept.  He stated that he wanted to talk to other family members who could help guide him with the decision-making process.  He was somewhat focused on the fact that his mother can still talk.  He reported that when he asks her if she wants to continue dialysis she says yes.  I reiterated the findings of the psychiatric team and the fact that she has consistently been unable to understand that if she is not eating and does not want a feeding tube that continuing dialysis would be futile and even with dialysis not eating would lead to her death.  He did state that if he decided against a feeding tube that it would be futile to  continue taking her to dialysis treatments noting that she would eventually get too sick to attend.  He was made aware that her lifespan without dialysis could range anywhere from 2 to 6 weeks. He verbalized understanding of that.  If he chooses to not place a tube (at least at this moment) he is agreeable to stopping dialysis and proceeding with end-of-life care.  After my meeting with the son received a call from the surgical team.  They state that after my discussion with the son they also called him and reiterated all of the above.  At this point they have nothing further to offer and recommend that if the son desires placement of a tube to call them back so we can further discuss this issue.  They have signed off for now.  Subjective: Awake lying in bed.  No specific complaints verbalized by patient.  Objective: Vitals:   07/30/21 2111 07/31/21 0405  BP: 123/81 (!) 159/58  Pulse: 97 98  Resp: 16 18  Temp: 98.4 F (36.9 C) 98.4 F (36.9 C)  SpO2: 93% 98%    Intake/Output Summary (Last 24 hours) at 07/31/2021 0830 Last data filed at 07/31/2021 0700 Gross per 24 hour  Intake 1527.79 ml  Output 2500 ml  Net -972.21 ml   Filed Weights   07/30/21 1332 07/30/21 2111  Weight: 64.2 kg 64.2 kg    Exam:  Constitutional: NAD, calm and pleasant Respiratory: Anterior lung sounds clear to auscultation but decreased in the bases, stable on room air with pulse oximetry 100% Cardiovascular: S1-S2, resting pulse rate nontachycardic, intermittently hypertensive Abdomen: LBM 10/05-still not eating.  Abdomen soft nontender nondistended.  On D10 IV fluid until decision can be made regarding G-tube placement Neurologic: CN 2-12 grossly intact -Strength 1/5 on the right-unable to use her right arm with purpose due to significant dysmetria. 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   DOES NOT HAVE CAPACITY.  Assessment/Plan: Acute problems: Sepsis physiology/RLL HCAP/Abn blood cxs c/w contaminant WBCs trend: 10.6 > 12.0 > 13.1 > 17.1 > 19.9 > 20.1 CRP elevated at 11.6-ESR 37; 10/5 CRP 11.8 and ESR 40 As of 10/5 had a low-grade fever in the a.m. of 99.5 Not hypoxemic but chest x-ray reveals new right lower lobe pleural effusion adjacent airspace disease vs infection-WBC trending up- continue Vanco and Cefepime-TM 99.1 (MRSA PCR neg) Blood cultures 1/2 + methicillin-resistant staph epidermis-this is consistent with contaminant Sacral wound unremarkable, left TDC cath site unremarkable  Failure to thrive /Generalized deconditioning  /very poor oral intake/ After multiple evaluations by the psychiatric team it was determined the patient lacked capacity to make medical or other life decisions. 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  Surmise this lady had dementia that was undetected prior to admission and given recent stroke and change in environment the dementia has worsened -Continue olanzapine at at bedtime  ESRD on HD Continue HD MWF  Hyperkalemia/hypophosphatemia Management per nephrology HD -supplementation as needed   Hypotension Blood pressure currently is stable on midodrine.  Constipation Patient was on chronic narcotics prior to admission and these remain in place Continue Chronulac daily  Type 2 diabetes mellitus Episodes of hypoglycemia Hemoglobin A1c 5.1.  Diet controlled at baseline  Chronic thrombus left upper  extremity Continue heparin  Stage II mid coccyx pressure injury, present on admission Continue wound care 10/3 continues to have some purulent drainage so this will need to be evaluated more closely may require wound care nurse assessment    GERD Continue Protonix and Pepcid     Data Reviewed: Basic Metabolic Panel: Recent Labs  Lab 07/25/21 0337 07/28/21 0630 07/29/21 0616 07/30/21 0300  NA 129* 126* 129* 126*  K 3.6 4.0 3.8 4.1  CL 91* 89* 93* 90*  CO2 26 27 28 27   GLUCOSE 88 107* 122* 163*  BUN 15 12 7* 11  CREATININE 3.36* 3.62* 2.72* 3.27*  CALCIUM 8.4* 8.4* 8.4* 8.2*  PHOS 3.3  --   --   --    Liver Function Tests: Recent Labs  Lab 07/25/21 0337 07/28/21 0630 07/29/21 0616 07/30/21 0300  AST  --  15 17 15   ALT  --  8 9 9   ALKPHOS  --  88 93 102  BILITOT  --  0.3 0.4 0.6  PROT  --  6.1* 5.8* 5.7*  ALBUMIN 2.0* 1.9* 2.0* 1.8*   No results for input(s): LIPASE, AMYLASE in the last 168 hours. No results for input(s): AMMONIA in the last 168 hours. CBC: Recent Labs  Lab 07/27/21 0213 07/28/21 0630 07/29/21 0616 07/30/21 0300 07/31/21 0339  WBC 12.0* 13.1* 17.1* 19.9* 20.1*  HGB 7.7* 8.6* 8.2* 7.9* 7.5*  HCT 25.9* 28.0* 26.6* 26.6* 24.6*  MCV 93.5 91.8 92.4 93.0 91.1  PLT 234 277 211 225 188    CBG: Recent Labs  Lab 07/30/21 0645 07/30/21 1335 07/30/21 1611 07/30/21 2110 07/31/21 0646  GLUCAP 111* 146* 137* 167* 133*     Scheduled Meds:  atorvastatin  40 mg Oral Daily   chlorhexidine  15 mL Mouth Rinse BID   Chlorhexidine Gluconate Cloth  6 each Topical Daily   darbepoetin (ARANESP) injection - DIALYSIS  200 mcg Intravenous Q Fri-HD   feeding supplement  237 mL Oral BID BM   gabapentin  100 mg Oral Q12H   insulin aspart  0-6 Units Subcutaneous TID AC & HS   lactulose  20 g Oral Daily   mouth rinse  15 mL Mouth Rinse q12n4p   midodrine  10 mg Oral TID WC   mirtazapine  7.5 mg Oral QHS   multivitamin  1 tablet Oral QHS   OLANZapine  zydis  2.5 mg Oral QHS   pantoprazole sodium  40 mg Oral Daily   sodium chloride flush  10-40 mL Intracatheter Q12H   thiamine  100 mg Oral Daily   venlafaxine  37.5 mg Oral BID   Continuous Infusions:  ceFEPime (MAXIPIME) IV 1 g (07/30/21 1548)   dextrose 40 mL/hr at 07/30/21 2021   heparin 1,150 Units/hr (07/30/21 1401)  vancomycin 750 mg (07/30/21 1136)    Principal Problem:   GI bleed Active Problems:   Essential hypertension   Type 2 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, with long-term current use of insulin (HCC)   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   ESRD on dialysis ([place])   Dyslipidemia   Hypokalemia   Stage 2 skin ulcer of sacral region ([place])   Cerebral thrombosis with cerebral infarction   Protein-calorie malnutrition, severe   Acute GI bleeding   Oropharyngeal dysphagia   Failure to thrive in adult   Inadequate oral intake   Clogged feeding tube   Fever   Rectal bleeding   Delirium due to another medical condition   Neurogenic dysphagia   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

## 2021-07-31 NOTE — Plan of Care (Signed)
  Problem: Activity: Goal: Risk for activity intolerance will decrease Outcome: Not Progressing   Problem: Nutrition: Goal: Adequate nutrition will be maintained Outcome: Not Progressing   

## 2021-07-31 NOTE — Progress Notes (Signed)
[facility] Progress Note   Subjective:  Seen in room - looks comfortable. BP high today, ongoing edema.  Objective Vitals:   07/30/21 1641 07/30/21 2111 07/31/21 0405 07/31/21 0919  BP: (!) 137/41 123/81 (!) 159/58 (!) 183/66  Pulse: (!) 101 97 98 91  Resp: '18 16 18 18  '$ Temp: 98 F (36.7 C) 98.4 F (36.9 C) 98.4 F (36.9 C) 98.4 F (36.9 C)  TempSrc:   Oral   SpO2:  93% 98% 100%  Weight:  64.2 kg    Height:       Physical Exam General: Chronically ill appearing woman, NAD. Room air. Heart: RRR; 2/6 murmur Lungs: CTA anteriorly Abdomen: soft Extremities: 2+ BUE edema, 1+ LLE edema, no RLE edema Dialysis Access: [facility]  Additional Objective Labs: Basic Metabolic Panel: Recent Labs  Lab 07/25/21 0337 07/28/21 0630 07/29/21 0616 07/30/21 0300  NA 129* 126* 129* 126*  K 3.6 4.0 3.8 4.1  CL 91* 89* 93* 90*  CO2 '26 27 28 27  '$ GLUCOSE 88 107* 122* 163*  BUN 15 12 7* 11  CREATININE 3.36* 3.62* 2.72* 3.27*  CALCIUM 8.4* 8.4* 8.4* 8.2*  PHOS 3.3  --   --   --    Liver Function Tests: Recent Labs  Lab 07/28/21 0630 07/29/21 0616 07/30/21 0300  AST '15 17 15  '$ ALT '8 9 9  '$ ALKPHOS 88 93 102  BILITOT 0.3 0.4 0.6  PROT 6.1* 5.8* 5.7*  ALBUMIN 1.9* 2.0* 1.8*   CBC: Recent Labs  Lab 07/27/21 0213 07/28/21 0630 07/29/21 0616 07/30/21 0300 07/31/21 0339  WBC 12.0* 13.1* 17.1* 19.9* 20.1*  HGB 7.7* 8.6* 8.2* 7.9* 7.5*  HCT 25.9* 28.0* 26.6* 26.6* 24.6*  MCV 93.5 91.8 92.4 93.0 91.1  PLT 234 277 211 225 188   Blood Culture    Component Value Date/Time   SDES BLOOD RIGHT ANTECUBITAL 07/29/2021 0902   SPECREQUEST  07/29/2021 0902    BOTTLES DRAWN AEROBIC AND ANAEROBIC Blood Culture adequate volume   CULT (A) 07/29/2021 0902    STAPHYLOCOCCUS EPIDERMIDIS THE SIGNIFICANCE OF ISOLATING THIS ORGANISM FROM A SINGLE SET OF BLOOD CULTURES WHEN MULTIPLE SETS ARE DRAWN IS UNCERTAIN. PLEASE NOTIFY THE MICROBIOLOGY DEPARTMENT WITHIN ONE WEEK IF SPECIATION  AND SENSITIVITIES ARE REQUIRED. Performed at [facility], [place] [address], [place], [place] [address]    REPTSTATUS PENDING 07/29/2021 [id number]   Medications:  ceFEPime (MAXIPIME) IV 1 g (07/30/21 1548)   dextrose 40 mL/hr at 07/30/21 2021   heparin 1,150 Units/hr (07/30/21 1401)   vancomycin 750 mg (07/30/21 1136)    atorvastatin  40 mg Oral Daily   chlorhexidine  15 mL Mouth Rinse BID   Chlorhexidine Gluconate Cloth  6 each Topical Daily   darbepoetin (ARANESP) injection - DIALYSIS  200 mcg Intravenous Q Fri-HD   feeding supplement  237 mL Oral BID BM   gabapentin  100 mg Oral Q12H   insulin aspart  0-6 Units Subcutaneous TID AC & HS   lactulose  20 g Oral Daily   mouth rinse  15 mL Mouth Rinse q12n4p   midodrine  10 mg Oral TID WC   mirtazapine  7.5 mg Oral QHS   multivitamin  1 tablet Oral QHS   OLANZapine zydis  2.5 mg Oral QHS   pantoprazole  40 mg Oral Daily   sodium chloride flush  10-40 mL Intracatheter Q12H   thiamine  100 mg Oral Daily   venlafaxine  37.5 mg Oral BID  Dialysis Orders: AF MWF  3h 73mn 400/500 62.5kg 3K/2.5 Ca P2 AVG HeRO   Hep none - Mircera 50 q 2 wks  (7/13)  Venofer 50 q wk - Parsabiv '5mg'$  [facility]   Assessment/Plan: AMS/FTT: Mental status that waxes/wanes.  HD recliner consistently for past 2 weeks - tentatively accepted back to outpatient HD unit, needs SNF. Melena/Rectal bleeding - GI consulted. Hx rectal ulcers.  EGD 7/27 normal, flex sig with mucosal ulceration c/w sterocoral ulcers, non bleeding. Hgb 8s, stable. ?Pneumonia/?Bacteremia: On IV abx - per primary. ESRD: Back to usual MWF schedule - next HD tomorrow. 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 AVG clotted 06/27/21, unable to be declotted d/t arm contracture. S/P conversion to tunneled HD catheter 07/02/21.  Hypertension/volume: BP variable, on midodrine '10mg'$  TID. More edema recently, UF as  tolerated. Adding holding parameters for midodrine not to give is SBP over 140. Anemia of ESRD: Hgb 7.5, continue Aranesp 2059m q Fri. MBD: CorrCa slightly high, Phos low. No binder or VDRA needed Nutrition: Alb very low. CoreTrac removed, continue to encourage full meals and PO supplements. 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 Psych eval from 9/14 determined that patient has functional mental capacity for medical decision-making. Psych re-consulted 9/23 to evaluate the patient again - at this point, patient does not have the capacity to make medical decisions. Ongoing discussions with patient's son continues. Dispo: DNR/DNI. Poor prognosis. Although patient is currently tolerating being in the chair for HD, we do not believe dialysis is adding to her quality of life. Appreciate palliative care involvement.  KaVeneta PentonPA-C 07/31/2021, 10:08 AM  [employer]

## 2021-08-01 DIAGNOSIS — F0280 Dementia in other diseases classified elsewhere without behavioral disturbance: Secondary | ICD-10-CM | POA: Diagnosis not present

## 2021-08-01 DIAGNOSIS — I6330 Cerebral infarction due to thrombosis of unspecified cerebral artery: Secondary | ICD-10-CM | POA: Diagnosis not present

## 2021-08-01 DIAGNOSIS — R627 Adult failure to thrive: Secondary | ICD-10-CM | POA: Diagnosis not present

## 2021-08-01 DIAGNOSIS — R1319 Other dysphagia: Secondary | ICD-10-CM | POA: Diagnosis not present

## 2021-08-01 LAB — RENAL FUNCTION PANEL
Albumin: 1.8 g/dL — ABNORMAL LOW (ref 3.5–5.0)
Anion gap: 9 (ref 5–15)
BUN: 12 mg/dL (ref 8–23)
CO2: 26 mmol/L (ref 22–32)
Calcium: 8.6 mg/dL — ABNORMAL LOW (ref 8.9–10.3)
Chloride: 92 mmol/L — ABNORMAL LOW (ref 98–111)
Creatinine, Ser: 3.16 mg/dL — ABNORMAL HIGH (ref 0.44–1.00)
GFR, Estimated: 15 mL/min — ABNORMAL LOW (ref >60)
Glucose, Bld: 129 mg/dL — ABNORMAL HIGH (ref 70–99)
Phosphorus: 2.5 mg/dL (ref 2.5–4.6)
Potassium: 3.6 mmol/L (ref 3.5–5.1)
Sodium: 127 mmol/L — ABNORMAL LOW (ref 135–145)

## 2021-08-01 LAB — CBC
HCT: 26.7 % — ABNORMAL LOW (ref 36.0–46.0)
Hemoglobin: 8 g/dL — ABNORMAL LOW (ref 12.0–15.0)
MCH: 27.4 pg (ref 26.0–34.0)
MCHC: 30 g/dL (ref 30.0–36.0)
MCV: 91.4 fL (ref 80.0–100.0)
Platelets: 203 10*3/uL (ref 150–400)
RBC: 2.92 MIL/uL — ABNORMAL LOW (ref 3.87–5.11)
RDW: 16.8 % — ABNORMAL HIGH (ref 11.5–15.5)
WBC: 18.2 10*3/uL — ABNORMAL HIGH (ref 4.0–10.5)
nRBC: 0.1 % (ref 0.0–0.2)

## 2021-08-01 LAB — GLUCOSE, CAPILLARY
Glucose-Capillary: 146 mg/dL — ABNORMAL HIGH (ref 70–99)
Glucose-Capillary: 180 mg/dL — ABNORMAL HIGH (ref 70–99)
Glucose-Capillary: 185 mg/dL — ABNORMAL HIGH (ref 70–99)
Glucose-Capillary: 131 mg/dL — ABNORMAL HIGH (ref 70–99)

## 2021-08-01 LAB — CULTURE, BLOOD (ROUTINE X 2): Special Requests: ADEQUATE

## 2021-08-01 LAB — HEPARIN LEVEL (UNFRACTIONATED): Heparin Unfractionated: 0.35 IU/mL (ref 0.30–0.70)

## 2021-08-01 NOTE — Progress Notes (Signed)
[facility] PROGRESS NOTE  Amy [name] TKP:[id number] DOB: 16-Jul-1946 DOA: 05/19/2021 PCP: [name], MD  Status:  Remains inpatient appropriate because:Unsafe d/c plan, IV treatments appropriate due to intensity of illness or inability to take PO, and Inpatient level of care appropriate due to severity of illness  Dispo: The patient is from: Home              Anticipated d/c is to: Home patient does not have funding to be placed in SNF              Patient currently is not medically stable to d/c.  Currently requiring IV antibiotic for pneumonia   Difficult to place patient Yes              Barriers to discharge: Currently requiring IV antibiotic to treat pneumonia, also son is trying to decide whether he wants to have feeding tube placed in patient    Level of care: Med-Surg  Code Status: DNR Family communication: Spoke with patient's son on 9/29.  DVT prophylaxis: Heparin COVID vaccination status: [name] 02/26/2020, Pfizer 04/10/2021    HPI: 75 y.o. female with PMH significant for ESRD on HD MWF, HTN, HLD, CVA s/p TPA 02/2021 with resultant left hemiparesis, partial blood clot upper extremity, recently hospitalized to [facility] on 03/13/2021-04/22/2021 during which patient underwent flexible sigmoidoscopy on 03/26/2021 which showed multiple rectal ulcers likely secondary to trauma from enemas and constipation. Patient presented to the ED on 7/25 with dark stool.  On presentation, hemoglobin was 9.6; GI was consulted.  She underwent EGD which was normal and flexible sigmoidoscopy which showed nonbleeding distal rectal ulcers/stercoral ulcers.  Subsequently, GI signed off.  Neurology was also consulted for altered mental status; MRI brain was similar to prior MRI at [facility] with a large right MCA stroke.  EEG this admission unremarkable. Nephrology has been following for dialysis needs. PEG tube was desired by family but could not be placed because of overlying transverse colon.   General surgery was consulted for G-tube placement but patient does not want a G-tube and  was deemed to have capacity by psych team.  Subsequently after further more detailed evaluations and observation it was finally clarified that she lacked capacity.  Surgical team was consulted the fact that the patient lacks underlying dysphagia and even though she lacks capacity continues to express a desire to not have a gastrostomy tube placed they are reluctant to proceed with surgery and have ordered follow-up SLP evaluation regarding dysphagia.  As of 51/7 son still conflicted over whether or not to place GT and will use the next few days to discuss with other family members who will help him come to a decision.   Subjective: Awake during dialysis.  Telling me she needs to "poop".  No other problems identified by the patient  Objective: Vitals:   07/31/21 2053 08/01/21 0528  BP: (!) 137/104 (!) 177/47  Pulse: 91 95  Resp: 20 18  Temp: 98.3 F (36.8 C) 98 F (36.7 C)  SpO2: 100% 98%    Intake/Output Summary (Last 24 hours) at 08/01/2021 0804 Last data filed at 08/01/2021 0534 Gross per 24 hour  Intake 1080.38 ml  Output 0 ml  Net 1080.38 ml   Filed Weights   07/30/21 1332 07/30/21 2111 08/01/21 0528  Weight: 64.2 kg 64.2 kg 98.4 kg    Exam:  Constitutional: NAD, calm and pleasant-examined during hemodialysis Respiratory: Sounds remain clear to auscultation but decreased in the bases.  Room  air, pulse oximetry 98%, no increased work of Cardiovascular: Heart sounds S1-S2 without any murmur, normotensive, no peripheral edema other than dependent edema on stroke side Abdomen: LBM 10/05-still not eating.  Abdomen soft nontender nondistended.  On D10 IV fluid  Neurologic: CN 2-12 grossly intact -Strength 1/5 on the right-unable to use her right arm with purpose due to significant dysmetria. Chronic left hemiparesis from prior CVA with associated left-sided neglect Psychiatric: Alert and  oriented x3 but higher executive processing skills not present.  DOES NOT HAVE CAPACITY.  Assessment/Plan: Acute problems: Sepsis physiology/RLL HCAP/Abn blood cxs c/w contaminant WBCs trend: 10.6 > 12.0 > 13.1 > 17.1 > 19.9 > 20.1 > 18.2 CRP elevated at 11.6-ESR 37; 10/5 CRP 11.8 and ESR 40 10/5  TM 99.5 Not hypoxemic and MRSA PCR neg Blood cultures 1/2 + methicillin-resistant staph epidermis-this is consistent with contaminant  Failure to thrive /Generalized deconditioning  /very poor oral intake/ After multiple evaluations by the psychiatric team it was determined the patient lacked capacity to make medical or other life decisions. 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  Surmise this lady had dementia that was undetected prior to admission and given recent stroke and change in environment the dementia has worsened -Continue olanzapine at at bedtime  ESRD on HD Continue HD MWF  Hyperkalemia/hypophosphatemia Management per nephrology HD -supplementation as needed   Hypotension Blood pressure currently is stable on midodrine.  Constipation Patient was on chronic narcotics prior to admission and these remain in place Continue Chronulac daily  Type 2 diabetes mellitus Episodes of hypoglycemia Hemoglobin A1c 5.1.  Diet controlled at baseline  Chronic thrombus left upper  extremity Continue heparin  Stage II mid coccyx pressure injury, present on admission Continue wound care 10/3 continues to have some purulent drainage so this will need to be evaluated more closely may require wound care nurse assessment    GERD Continue Protonix and Pepcid     Data Reviewed: Basic Metabolic Panel: Recent Labs  Lab 07/28/21 0630 07/29/21 0616 07/30/21 0300 08/01/21 0348  NA 126* 129* 126* 127*  K 4.0 3.8 4.1 3.6  CL 89* 93* 90* 92*  CO2 27 28 27 26   GLUCOSE 107* 122* 163* 129*  BUN 12 7* 11 12  CREATININE 3.62* 2.72* 3.27* 3.16*  CALCIUM 8.4* 8.4* 8.2* 8.6*  PHOS  --   --   --  2.5   Liver Function Tests: Recent Labs  Lab 07/28/21 0630 07/29/21 0616 07/30/21 0300 08/01/21 0348  AST 15 17 15   --   ALT 8 9 9   --   ALKPHOS 88 93 102  --   BILITOT 0.3 0.4 0.6  --   PROT 6.1* 5.8* 5.7*  --   ALBUMIN 1.9* 2.0* 1.8* 1.8*   No results for input(s): LIPASE, AMYLASE in the last 168 hours. No results for input(s): AMMONIA in the last 168 hours. CBC: Recent Labs  Lab 07/28/21 0630 07/29/21 0616 07/30/21 0300 07/31/21  5956 08/01/21 0348  WBC 13.1* 17.1* 19.9* 20.1* 18.2*  HGB 8.6* 8.2* 7.9* 7.5* 8.0*  HCT 28.0* 26.6* 26.6* 24.6* 26.7*  MCV 91.8 92.4 93.0 91.1 91.4  PLT 277 211 225 188 203    CBG: Recent Labs  Lab 07/31/21 0646 07/31/21 1129 07/31/21 1636 07/31/21 2050 08/01/21 0632  GLUCAP 133* 148* 137* 133* 146*     Scheduled Meds:  atorvastatin  40 mg Oral Daily   chlorhexidine  15 mL Mouth Rinse BID   Chlorhexidine Gluconate Cloth  6 each Topical Daily   darbepoetin (ARANESP) injection - DIALYSIS  200 mcg Intravenous Q Fri-HD   feeding supplement  237 mL Oral BID BM   gabapentin  100 mg Oral Q12H   insulin aspart  0-6 Units Subcutaneous TID AC & HS   lactulose  20 g Oral Daily   mouth rinse  15 mL Mouth Rinse q12n4p   midodrine  10 mg Oral TID WC   mirtazapine  7.5 mg Oral QHS   multivitamin  1 tablet Oral QHS    OLANZapine zydis  2.5 mg Oral QHS   pantoprazole  40 mg Oral Daily   sodium chloride flush  10-40 mL Intracatheter Q12H   thiamine  100 mg Oral Daily   venlafaxine  37.5 mg Oral BID   Continuous Infusions:  ceFEPime (MAXIPIME) IV 1 g (07/31/21 1624)   dextrose 40 mL/hr at 07/30/21 2021   heparin 1,150 Units/hr (07/31/21 1710)   vancomycin 750 mg (07/30/21 1136)    Principal Problem:   GI bleed Active Problems:   Essential hypertension   Type 2 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, with long-term current use of insulin (HCC)   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   ESRD on dialysis (HCC)   Dyslipidemia   Hypokalemia   Stage 2 skin ulcer of sacral region ([place])   Cerebral thrombosis with cerebral infarction   Protein-calorie malnutrition, severe   Acute GI bleeding   Oropharyngeal dysphagia   Failure to thrive in adult   Inadequate oral intake   Clogged feeding tube   Fever   Rectal bleeding   Delirium due to another medical condition   Neurogenic dysphagia   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

## 2021-08-01 NOTE — Progress Notes (Signed)
Nt tried to feed  patient refused kept saying no, no, no.  No family present and no food from home brought to hospital.

## 2021-08-01 NOTE — Progress Notes (Signed)
ANTICOAGULATION CONSULT NOTE - Follow Up Consult  Pharmacy Consult for Heparin Indication:  hx CVA and chronic UE thrombus  Allergies  Allergen Reactions   Sensipar [Cinacalcet]     Per MAR   Statins     Per [facility]    Patient Measurements: Height: '5\' 7"'$  (170.2 cm) Weight: 98.4 kg (217 lb) IBW/kg (Calculated) : 61.6 - recorded weights widely variable 64.2 kg - 105.4 kg Heparin Dosing Weight: unclear  Vital Signs: Temp: 98 F (36.7 C) (10/07 0528) Temp Source: Oral (10/07 0528) BP: 177/47 (10/07 0528) Pulse Rate: 95 (10/07 0528)  Labs: Recent Labs    07/30/21 0300 07/31/21 0339 08/01/21 0348  HGB 7.9* 7.5* 8.0*  HCT 26.6* 24.6* 26.7*  PLT 225 188 203  HEPARINUNFRC 0.52 0.40 0.35  CREATININE 3.27*  --  3.16*    ESRD  Assessment: 75 y.o. female with hx CVA and chronic UE thrombus, on apixaban PTA.  Patient with prolonged hospitalization and difficult disposition.  Family has elected to pursue PEG tube placement for ongoing nutritional needs.  Surgery consulted, see MD progress notes indicating surgery reluctant to place PEG.   Eliquis held due to potential for procedure  [facility] placement and PEG placement). Pharmacy consulted to bridge with IV heparin pending surgical plans. Last dose Eliquis 9/23 at 0900.    Heparin level therapeutic this AM Hgb steadily trending down, f/u Aranesp due tomorrow.   Goal of Therapy:  Heparin level 0.3-0.7 units/ml Monitor platelets by anticoagulation protocol: Yes   Plan:  Continue heparin drip at 1150 units/hr Daily heparin level and CBC Monitor for s/sx bleeding F/u restart apixaban    Thank you Anette Guarneri, PharmD 08/01/2021 8:13 AM  **Pharmacist phone directory can be found on [email or web address] listed under [place]**

## 2021-08-01 NOTE — Progress Notes (Signed)
[facility] Progress Note   Subjective:  Seen on HD - 3L UFG and tolerating. Denies CP/dyspnea. Says shoulder hurts. Seems like discussed regarding PEG is still the hold up for discharge. Of note, patient agreeable today - not sure if that means much. Despite not eating much, her weight is not really dropping dramatically. She has been more conversant and doing well in recliner on dialysis.   Objective Vitals:   08/01/21 0830 08/01/21 0900 08/01/21 0930 08/01/21 1000  BP: (!) 211/86 (!) 157/60 (!) 148/59 (!) 124/52  Pulse: (!) 102 (!) 107 (!) 107 (!) 102  Resp: '17  18 16  '$ Temp:      TempSrc:      SpO2:      Weight:      Height:       Physical Exam General: Chronically ill appearing woman, NAD. Room air. Heart: RRR; 2/6 murmur Lungs: CTA anteriorly Abdomen: soft Extremities: 2+ BUE edema, Trace LLE edema, no RLE edema [facility]ialysis Access: [facility]    Additional Objective Labs: Basic Metabolic Panel: Recent Labs  Lab 07/29/21 0616 07/30/21 0300 08/01/21 0348  NA 129* 126* 127*  K 3.8 4.1 3.6  CL 93* 90* 92*  CO2 '28 27 26  '$ GLUCOSE 122* 163* 129*  BUN 7* 11 12  CREATININE 2.72* 3.27* 3.16*  CALCIUM 8.4* 8.2* 8.6*  PHOS  --   --  2.5   Liver Function Tests: Recent Labs  Lab 07/28/21 0630 07/29/21 0616 07/30/21 0300 08/01/21 0348  AST '15 17 15  '$ --   ALT '8 9 9  '$ --   ALKPHOS 88 93 102  --   BILITOT 0.3 0.4 0.6  --   PROT 6.1* 5.8* 5.7*  --   ALBUMIN 1.9* 2.0* 1.8* 1.8*   CBC: Recent Labs  Lab 07/28/21 0630 07/29/21 0616 07/30/21 0300 07/31/21 0339 08/01/21 0348  WBC 13.1* 17.1* 19.9* 20.1* 18.2*  HGB 8.6* 8.2* 7.9* 7.5* 8.0*  HCT 28.0* 26.6* 26.6* 24.6* 26.7*  MCV 91.8 92.4 93.0 91.1 91.4  PLT 277 211 225 188 203   Medications:  ceFEPime (MAXIPIME) IV 1 g (07/31/21 1624)   dextrose 40 mL/hr at 07/30/21 2021   heparin 1,150 Units/hr (07/31/21 1710)   vancomycin 750 mg (07/30/21 1136)    atorvastatin  40 mg Oral Daily   chlorhexidine  15 mL  Mouth Rinse BID   Chlorhexidine Gluconate Cloth  6 each Topical Daily   darbepoetin (ARANESP) injection - DIALYSIS  200 mcg Intravenous Q Fri-HD   feeding supplement  237 mL Oral BID BM   gabapentin  100 mg Oral Q12H   insulin aspart  0-6 Units Subcutaneous TID AC & HS   lactulose  20 g Oral Daily   mouth rinse  15 mL Mouth Rinse q12n4p   midodrine  10 mg Oral TID WC   mirtazapine  7.5 mg Oral QHS   multivitamin  1 tablet Oral QHS   OLANZapine zydis  2.5 mg Oral QHS   pantoprazole  40 mg Oral Daily   sodium chloride flush  10-40 mL Intracatheter Q12H   thiamine  100 mg Oral Daily   venlafaxine  37.5 mg Oral BID    Dialysis Orders: AF MWF  3h 46mn 400/500 62.5kg 3K/2.5 Ca P2 AVG HeRO   Hep none - Mircera 50 q 2 wks  (7/13)  Venofer 50 q wk - Parsabiv '5mg'$  [facility] q HD [facility]   Assessment/Plan: AMS/FTT: Mental status that waxes/wanes.  HD recliner consistently for past 3 weeks - tentatively accepted back to outpatient HD unit, needs SNF. Melena/Rectal bleeding - GI consulted. Hx rectal ulcers.  EGD 7/27 normal, flex sig with mucosal ulceration c/w sterocoral ulcers, non bleeding. Hgb 8s, stable. ?Pneumonia/?Bacteremia: On IV abx - per primary. ESRD: Back to usual MWF schedule - HD today. 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 AVG clotted 06/27/21, unable to be declotted d/t arm contracture. S/P conversion to tunneled HD catheter 07/02/21.  Hypertension/volume: BP variable, on midodrine '10mg'$  TID. More edema recently, UF as tolerated. Adding holding parameters for midodrine not to give is SBP over 140. Anemia of ESRD: Hgb 8 continue Aranesp 241mg q Fri. MBD: CorrCa slightly high, Phos low. No binder or VDRA needed Nutrition: Alb very low. CoreTrac removed, continue to encourage full meals and PO supplements. 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 Psych eval from 9/14 determined that patient has  functional mental capacity for medical decision-making. Psych re-consulted 9/23 to evaluate the patient again - at this point, patient does not have the capacity to make medical decisions. Ongoing discussions with patient's son continues. Dispo: DNR/DNI. Poor prognosis. Although patient is currently tolerating being in the chair for HD and we do not believe dialysis is adding to her quality of life but that is for her to decide. Would like to get her out of the hospital.  KVeneta Penton PA-C 08/01/2021, 10:12 AM  [employer]

## 2021-08-02 LAB — GLUCOSE, CAPILLARY
Glucose-Capillary: 117 mg/dL — ABNORMAL HIGH (ref 70–99)
Glucose-Capillary: 150 mg/dL — ABNORMAL HIGH (ref 70–99)
Glucose-Capillary: 173 mg/dL — ABNORMAL HIGH (ref 70–99)
Glucose-Capillary: 225 mg/dL — ABNORMAL HIGH (ref 70–99)

## 2021-08-02 LAB — HEPARIN LEVEL (UNFRACTIONATED): Heparin Unfractionated: 0.74 IU/mL — ABNORMAL HIGH (ref 0.30–0.70)

## 2021-08-02 NOTE — Progress Notes (Signed)
  Patient seen and examined at bedside   On physical exam she is not any acute distress.  Will says she hurts but is unable to say where  Her blood cultures are showing gram-positive cocci which most likely is a contaminate.  WBC count remains up.  Monitor wounds closely.    Will continue to work with family/son regarding [place] (pre chart review, patient does not have capacity).  Not eating, on HD, placement issues as well.    Eulogio Bear DO

## 2021-08-02 NOTE — Progress Notes (Signed)
ANTICOAGULATION CONSULT NOTE - Follow Up Consult  Pharmacy Consult for Heparin Indication:  hx CVA and chronic UE thrombus  Allergies  Allergen Reactions   Sensipar [Cinacalcet]     Per MAR   Statins     Per [facility]    Patient Measurements: Height: '5\' 7"'$  (170.2 cm) Weight: 65 kg (143 lb 4.8 oz) IBW/kg (Calculated) : 61.6 - recorded weights widely variable 64.2 kg - 105.4 kg Heparin Dosing Weight: unclear  Vital Signs: Temp: 97.6 F (36.4 C) (10/08 0514) Temp Source: Axillary (10/08 0514) BP: 175/67 (10/08 0514) Pulse Rate: 97 (10/08 0514)  Labs: Recent Labs    07/31/21 0339 08/01/21 0348 08/02/21 0417  HGB 7.5* 8.0*  --   HCT 24.6* 26.7*  --   PLT 188 203  --   HEPARINUNFRC 0.40 0.35 0.74*  CREATININE  --  3.16*  --     ESRD  Assessment: 75 y.o. female with hx CVA and chronic UE thrombus, on apixaban PTA.  Patient with prolonged hospitalization and difficult disposition.  Family has elected to pursue PEG tube placement for ongoing nutritional needs.  Surgery consulted, see MD progress notes indicating surgery reluctant to place PEG.   Eliquis held due to potential for procedure  [facility] placement and PEG placement). Pharmacy consulted to bridge with IV heparin pending surgical plans. Last dose Eliquis 9/23 at 0900.    Heparin level elevated this AM HgB stable   Goal of Therapy:  Heparin level 0.3-0.7 units/ml Monitor platelets by anticoagulation protocol: Yes   Plan:  Decrease heparin to 1050 units / hr Daily heparin level and CBC Monitor for s/sx bleeding F/u restart apixaban    Thank you Anette Guarneri, PharmD 08/02/2021 7:27 AM  **Pharmacist phone directory can be found on [email or web address] listed under [place]**

## 2021-08-02 NOTE — Progress Notes (Signed)
[facility] Progress Note   Subjective: Seen in room. Sleeping difficult to arouse, but answers a few simple questions then back to sleep.   Objective Vitals:   08/01/21 1620 08/01/21 2152 08/02/21 0514 08/02/21 0843  BP: (!) 144/51 (!) 132/113 (!) 175/67 (!) 166/54  Pulse: 96 90 97 94  Resp: '18 18 18 18  '$ Temp: 98.3 F (36.8 C) 98.6 F (37 C) 97.6 F (36.4 C) 98.5 F (36.9 C)  TempSrc: Axillary Axillary Axillary Oral  SpO2: 100% 100% 98% 100%  Weight:      Height:       Physical Exam General: Chronically ill appearing female in NAD Heart: [id number] RRR 2/6 Systolic M Lungs: CTAB  Abdomen:soft, NABS Extremities:  + UE edema, no LE edema Dialysis Access: [facility] drsg intact    Additional Objective Labs: Basic Metabolic Panel: Recent Labs  Lab 07/29/21 0616 07/30/21 0300 08/01/21 0348  NA 129* 126* 127*  K 3.8 4.1 3.6  CL 93* 90* 92*  CO2 '28 27 26  '$ GLUCOSE 122* 163* 129*  BUN 7* 11 12  CREATININE 2.72* 3.27* 3.16*  CALCIUM 8.4* 8.2* 8.6*  PHOS  --   --  2.5   Liver Function Tests: Recent Labs  Lab 07/28/21 0630 07/29/21 0616 07/30/21 0300 08/01/21 0348  AST '15 17 15  '$ --   ALT '8 9 9  '$ --   ALKPHOS 88 93 102  --   BILITOT 0.3 0.4 0.6  --   PROT 6.1* 5.8* 5.7*  --   ALBUMIN 1.9* 2.0* 1.8* 1.8*   No results for input(s): LIPASE, AMYLASE in the last 168 hours. CBC: Recent Labs  Lab 07/28/21 0630 07/29/21 0616 07/30/21 0300 07/31/21 0339 08/01/21 0348  WBC 13.1* 17.1* 19.9* 20.1* 18.2*  HGB 8.6* 8.2* 7.9* 7.5* 8.0*  HCT 28.0* 26.6* 26.6* 24.6* 26.7*  MCV 91.8 92.4 93.0 91.1 91.4  PLT 277 211 225 188 203   Blood Culture    Component Value Date/Time   SDES BLOOD RIGHT ANTECUBITAL 07/29/2021 0902   SPECREQUEST  07/29/2021 0902    BOTTLES DRAWN AEROBIC AND ANAEROBIC Blood Culture adequate volume   CULT (A) 07/29/2021 0902    STAPHYLOCOCCUS EPIDERMIDIS THE SIGNIFICANCE OF ISOLATING THIS ORGANISM FROM A SINGLE SET OF BLOOD CULTURES WHEN  MULTIPLE SETS ARE DRAWN IS UNCERTAIN. PLEASE NOTIFY THE MICROBIOLOGY DEPARTMENT WITHIN ONE WEEK IF SPECIATION AND SENSITIVITIES ARE REQUIRED. Performed at [facility], [place] [address], [place],  [address]    REPTSTATUS 08/01/2021 FINAL 07/29/2021 0902    Cardiac Enzymes: No results for input(s): CKTOTAL, CKMB, CKMBINDEX, TROPONINI in the last 168 hours. CBG: Recent Labs  Lab 08/01/21 1455 08/01/21 1619 08/01/21 2150 08/02/21 0640 08/02/21 1137  GLUCAP 180* 185* 131* 117* 150*   Iron Studies: No results for input(s): IRON, TIBC, TRANSFERRIN, FERRITIN in the last 72 hours. '@lablastinr3'$ @ Studies/Results: No results found. Medications:  ceFEPime (MAXIPIME) IV 1 g (08/01/21 1526)   dextrose 40 mL/hr at 08/01/21 2325   heparin 1,050 Units/hr (08/02/21 0742)    atorvastatin  40 mg Oral Daily   chlorhexidine  15 mL Mouth Rinse BID   Chlorhexidine Gluconate Cloth  6 each Topical Daily   darbepoetin (ARANESP) injection - DIALYSIS  200 mcg Intravenous Q Fri-HD   feeding supplement  237 mL Oral BID BM   gabapentin  100 mg Oral Q12H   insulin aspart  0-6 Units Subcutaneous TID AC & HS   lactulose  20 g Oral Daily  mouth rinse  15 mL Mouth Rinse q12n4p   midodrine  10 mg Oral TID WC   mirtazapine  7.5 mg Oral QHS   multivitamin  1 tablet Oral QHS   OLANZapine zydis  2.5 mg Oral QHS   pantoprazole  40 mg Oral Daily   sodium chloride flush  10-40 mL Intracatheter Q12H   thiamine  100 mg Oral Daily   venlafaxine  37.5 mg Oral BID     Dialysis Orders: AF MWF  3h 43mn 400/500 62.5kg 3K/2.5 Ca P2 AVG HeRO   Hep none - Mircera 50 q 2 wks  (7/13)  Venofer 50 q wk - Parsabiv '5mg'$  [facility] HD [facility]   Assessment/Plan: AMS/FTT: Mental status that waxes/wanes.  HD recliner consistently for past 3 weeks - tentatively accepted back to outpatient HD unit . Which dialysis unit she goes to is dependent on which dialysis is closest to SNF to which she is discharged  (Clarified with Dr. GMoshe Cipro10/02/2021) She needs SNF. Melena/Rectal bleeding - GI consulted. Hx rectal ulcers.  EGD 7/27 normal, flex sig with mucosal ulceration c/w sterocoral ulcers, non bleeding. Hgb 8s, stable. ?Pneumonia/?Bacteremia: On IV abx - per primary. ESRD: Back to usual MWF schedule - Next HD 08/04/2021 Access Issues/AVG clotted: Had f'gram  6/23 /22 which showed chronic thrombus and was started on Eliquis. AVG clotted 06/27/21, unable to be declotted d/t arm contracture. S/P conversion to tunneled HD catheter 07/02/21.  Hypertension/volume: BP variable, on midodrine '10mg'$  TID. More edema recently, UF as tolerated. Adding holding parameters for midodrine not to give is SBP over 140. Anemia of ESRD: Hgb 8 continue Aranesp 2032m q Fri. MBD: CorrCa slightly high, Phos low. No binder or VDRA needed Nutrition: Alb very low. CoreTrac removed, continue to encourage full meals and PO supplements. 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 Psych eval from 9/14 determined that patient has functional mental capacity for medical decision-making. Psych re-consulted 9/23 to evaluate the patient again - at this point, patient does not have the capacity to make medical decisions. Ongoing discussions with patient's son continues. Dispo: DNR/DNI. Poor prognosis. Although patient is currently tolerating being in the chair for HD and we do not believe dialysis is adding to her quality of life but that is for her to decide. Would like to get her out of the hospital.  RiJimmye NormanBrown NP-C 08/02/2021, 12:27 PM  [employer] [phone]

## 2021-08-03 ENCOUNTER — Inpatient Hospital Stay (HOSPITAL_COMMUNITY)
Admit: 2021-08-03 | Discharge: 2021-08-03 | Disposition: A | Discharge status: Home / Self Care | Payer: Medicare Other | Attending: Internal Medicine | Admitting: Internal Medicine

## 2021-08-03 DIAGNOSIS — R569 Unspecified convulsions: Secondary | ICD-10-CM

## 2021-08-03 LAB — HEPARIN LEVEL (UNFRACTIONATED): Heparin Unfractionated: 0.27 IU/mL — ABNORMAL LOW (ref 0.30–0.70)

## 2021-08-03 LAB — BASIC METABOLIC PANEL
Anion gap: 8 (ref 5–15)
BUN: 8 mg/dL (ref 8–23)
CO2: 25 mmol/L (ref 22–32)
Calcium: 8.3 mg/dL — ABNORMAL LOW (ref 8.9–10.3)
Chloride: 94 mmol/L — ABNORMAL LOW (ref 98–111)
Creatinine, Ser: 2.96 mg/dL — ABNORMAL HIGH (ref 0.44–1.00)
GFR, Estimated: 16 mL/min — ABNORMAL LOW (ref >60)
Glucose, Bld: 129 mg/dL — ABNORMAL HIGH (ref 70–99)
Potassium: 3.4 mmol/L — ABNORMAL LOW (ref 3.5–5.1)
Sodium: 127 mmol/L — ABNORMAL LOW (ref 135–145)

## 2021-08-03 LAB — GLUCOSE, CAPILLARY
Glucose-Capillary: 134 mg/dL — ABNORMAL HIGH (ref 70–99)
Glucose-Capillary: 140 mg/dL — ABNORMAL HIGH (ref 70–99)
Glucose-Capillary: 181 mg/dL — ABNORMAL HIGH (ref 70–99)
Glucose-Capillary: 167 mg/dL — ABNORMAL HIGH (ref 70–99)

## 2021-08-03 LAB — CBC
HCT: 23.3 % — ABNORMAL LOW (ref 36.0–46.0)
Hemoglobin: 7.1 g/dL — ABNORMAL LOW (ref 12.0–15.0)
MCH: 27.4 pg (ref 26.0–34.0)
MCHC: 30.5 g/dL (ref 30.0–36.0)
MCV: 90 fL (ref 80.0–100.0)
Platelets: 194 10*3/uL (ref 150–400)
RBC: 2.59 MIL/uL — ABNORMAL LOW (ref 3.87–5.11)
RDW: 17.1 % — ABNORMAL HIGH (ref 11.5–15.5)
WBC: 20.5 10*3/uL — ABNORMAL HIGH (ref 4.0–10.5)
nRBC: 0.3 % — ABNORMAL HIGH (ref 0.0–0.2)

## 2021-08-03 LAB — CULTURE, BLOOD (ROUTINE X 2)
Culture: NO GROWTH
Special Requests: ADEQUATE

## 2021-08-03 LAB — AMMONIA: Ammonia: 33 umol/L (ref 9–35)

## 2021-08-03 MED ORDER — CHLORHEXIDINE GLUCONATE CLOTH 2 % EX PADS
6.0000 | MEDICATED_PAD | Freq: Every day | CUTANEOUS | Status: DC
  Administered 2021-08-04 – 2021-08-15 (×11): 6 via TOPICAL

## 2021-08-03 NOTE — Progress Notes (Signed)
ANTICOAGULATION CONSULT NOTE - Follow Up Consult  Pharmacy Consult for Heparin Indication:  hx CVA and chronic UE thrombus  Allergies  Allergen Reactions   Sensipar [Cinacalcet]     Per MAR   Statins     Per [facility]    Patient Measurements: Height: '5\' 7"'$  (170.2 cm) Weight: 65 kg (143 lb 4.8 oz) IBW/kg (Calculated) : 61.6 - recorded weights widely variable 64.2 kg - 105.4 kg Heparin Dosing Weight: unclear  Vital Signs: Temp: 98.4 F (36.9 C) (10/09 0557) Temp Source: Oral (10/08 2126) BP: 148/51 (10/09 0557) Pulse Rate: 95 (10/09 0557)  Labs: Recent Labs    08/01/21 0348 08/02/21 0417 08/03/21 0603  HGB 8.0*  --   --   HCT 26.7*  --   --   PLT 203  --   --   HEPARINUNFRC 0.35 0.74* 0.27*  CREATININE 3.16*  --   --     ESRD  Assessment: 75 y.o. female with hx CVA and chronic UE thrombus, on apixaban PTA.  Patient with prolonged hospitalization and difficult disposition.  Family has elected to pursue PEG tube placement for ongoing nutritional needs.  Surgery consulted, see MD progress notes indicating surgery reluctant to place PEG.   Eliquis held due to potential for procedure  [facility] placement and PEG placement). Pharmacy consulted to bridge with IV heparin pending surgical plans. Last dose Eliquis 9/23 at 0900.    Heparin level now slightly sub-therapeutic this morning at 0.27 (0.74 on 10/8) HgB stable   Goal of Therapy:  Heparin level 0.3-0.7 units/ml Monitor platelets by anticoagulation protocol: Yes   Plan:  Increase heparin to 1100 units / hr Daily heparin level and CBC Monitor for s/sx bleeding F/u restart apixaban    Thank you Anette Guarneri, PharmD 08/03/2021 7:41 AM  **Pharmacist phone directory can be found on [email or web address] listed under [place]**

## 2021-08-03 NOTE — Progress Notes (Signed)
  Patient seen and examined at bedside  -today appears less engaged.  Some twitching on the left side of face.  Wil open eyes but does not speak -labs show an increasing WBC count  10/4: blood cultures are showing gram-positive cocci which most likely is a contaminate.  WBC count remains up.  Monitor wounds closely and may need further imaging  -check EEG -ammonia level normal  Will continue to work with family/son regarding GOC (pre chart review, patient does not have capacity).  Not eating, on HD, placement issues as well.    Eulogio Bear DO

## 2021-08-03 NOTE — Procedures (Signed)
Patient Name: Amy [name]  MRN: [id number]  Epilepsy Attending: Lora Havens  Referring Physician/Provider: Dr Eulogio Bear Date: 08/03/2021 Duration: 24.76mns   Patient history: 75year old female with prior strokes with altered mental status and twitching on left side of face.  EEG to evaluate for seizures.   Level of alertness: Awake   AEDs during EEG study: GBP   Technical aspects: This EEG study was done with scalp electrodes positioned according to the 10-20 International system of electrode placement. Electrical activity was acquired at a sampling rate of '500Hz'$  and reviewed with a high frequency filter of '70Hz'$  and a low frequency filter of '1Hz'$ . EEG data were recorded continuously and digitally stored.    Description: This EEG study was done with scalp electrodes positioned according to the 10-20 International system of electrode placement. Electrical activity was acquired at a sampling rate of '500Hz'$  and reviewed with a high frequency filter of '70Hz'$  and a low frequency filter of '1Hz'$ . EEG data were recorded continuously and digitally stored.    Description: EEG showed continuous generalized and lateralized right hemisphere 3-'5hz'$  theta-delta slowing which at times appear sharply contoured, specially when stimulated. Hyperventilation and photic stimulation were not performed.      ABNORMALITY -Continuous slow, generalized and lateralized right hemisphere   IMPRESSION: This study is suggestive of cortical dysfunction arising from right hemisphere likely secondary to underlying structural abnormality/stroke.  There is also moderate diffuse encephalopathy, nonspecific etiology.  No seizures or definite epileptiform discharges were seen throughout the recording.  If concern for ictal-interical activity persists, please consider long term monitoring.    Velmer Woelfel OBarbra Sarks

## 2021-08-03 NOTE — Progress Notes (Signed)
EEG complete - results pending 

## 2021-08-03 NOTE — Plan of Care (Signed)
  Problem: Health Behavior/Discharge Planning: Goal: Ability to manage health-related needs will improve Outcome: Not Progressing   

## 2021-08-03 NOTE — Progress Notes (Addendum)
[facility] Progress Note   Subjective: Seen in room. No changes. HD tomorrow.   Objective Vitals:   08/02/21 1644 08/02/21 2126 08/03/21 0557 08/03/21 0900  BP: (!) 162/51 (!) 175/65 (!) 148/51 (!) 142/66  Pulse: (!) 101 98 95 99  Resp: '18 16 16 16  '$ Temp: 98 F (36.7 C) 98.4 F (36.9 C) 98.4 F (36.9 C) 98 F (36.7 C)  TempSrc: Oral Oral  Oral  SpO2: 92% 100% 99% 98%  Weight:      Height:       Physical Exam General: Chronically ill appearing female in NAD Heart: [id number] RRR 2/6 Systolic M Lungs: CTAB  Abdomen:soft, NABS Extremities:  + UE edema, no LE edema Dialysis Access: [facility] drsg intact   Additional Objective Labs: Basic Metabolic Panel: Recent Labs  Lab 07/30/21 0300 08/01/21 0348 08/03/21 0938  NA 126* 127* 127*  K 4.1 3.6 3.4*  CL 90* 92* 94*  CO2 '27 26 25  '$ GLUCOSE 163* 129* 129*  BUN '11 12 8  '$ CREATININE 3.27* 3.16* 2.96*  CALCIUM 8.2* 8.6* 8.3*  PHOS  --  2.5  --    Liver Function Tests: Recent Labs  Lab 07/28/21 0630 07/29/21 0616 07/30/21 0300 08/01/21 0348  AST '15 17 15  '$ --   ALT '8 9 9  '$ --   ALKPHOS 88 93 102  --   BILITOT 0.3 0.4 0.6  --   PROT 6.1* 5.8* 5.7*  --   ALBUMIN 1.9* 2.0* 1.8* 1.8*   No results for input(s): LIPASE, AMYLASE in the last 168 hours. CBC: Recent Labs  Lab 07/29/21 0616 07/30/21 0300 07/31/21 0339 08/01/21 0348 08/03/21 0938  WBC 17.1* 19.9* 20.1* 18.2* 20.5*  HGB 8.2* 7.9* 7.5* 8.0* 7.1*  HCT 26.6* 26.6* 24.6* 26.7* 23.3*  MCV 92.4 93.0 91.1 91.4 90.0  PLT 211 225 188 203 194   Blood Culture    Component Value Date/Time   SDES BLOOD RIGHT ANTECUBITAL 07/29/2021 0902   SPECREQUEST  07/29/2021 0902    BOTTLES DRAWN AEROBIC AND ANAEROBIC Blood Culture adequate volume   CULT (A) 07/29/2021 0902    STAPHYLOCOCCUS EPIDERMIDIS THE SIGNIFICANCE OF ISOLATING THIS ORGANISM FROM A SINGLE SET OF BLOOD CULTURES WHEN MULTIPLE SETS ARE DRAWN IS UNCERTAIN. PLEASE NOTIFY THE MICROBIOLOGY DEPARTMENT  WITHIN ONE WEEK IF SPECIATION AND SENSITIVITIES ARE REQUIRED. Performed at [facility], [place] [address] [place], [place] [address]    REPTSTATUS 08/01/2021 FINAL 07/29/2021 0902    Cardiac Enzymes: No results for input(s): CKTOTAL, CKMB, CKMBINDEX, TROPONINI in the last 168 hours. CBG: Recent Labs  Lab 08/02/21 0640 08/02/21 1137 08/02/21 1643 08/02/21 2125 08/03/21 0642  GLUCAP 117* 150* 173* 225* 134*   Iron Studies: No results for input(s): IRON, TIBC, TRANSFERRIN, FERRITIN in the last 72 hours. '@lablastinr3'$ @ Studies/Results: No results found. Medications:  ceFEPime (MAXIPIME) IV 1 g (08/02/21 1622)   heparin 1,100 Units/hr (08/03/21 0806)    atorvastatin  40 mg Oral Daily   chlorhexidine  15 mL Mouth Rinse BID   Chlorhexidine Gluconate Cloth  6 each Topical Daily   darbepoetin (ARANESP) injection - DIALYSIS  200 mcg Intravenous Q Fri-HD   feeding supplement  237 mL Oral BID BM   gabapentin  100 mg Oral Q12H   insulin aspart  0-6 Units Subcutaneous TID AC & HS   lactulose  20 g Oral Daily   mouth rinse  15 mL Mouth Rinse q12n4p   midodrine  10 mg Oral TID  WC   mirtazapine  7.5 mg Oral QHS   multivitamin  1 tablet Oral QHS   OLANZapine zydis  2.5 mg Oral QHS   pantoprazole  40 mg Oral Daily   sodium chloride flush  10-40 mL Intracatheter Q12H   thiamine  100 mg Oral Daily   venlafaxine  37.5 mg Oral BID     Dialysis Orders: AF MWF  3h 21mn 400/500 62.5kg 3K/2.5 Ca P2 AVG HeRO   Hep none - Mircera 50 q 2 wks  (7/13)  Venofer 50 q wk - Parsabiv '5mg'$  [facility]   Assessment/Plan: AMS/FTT: Mental status that waxes/wanes.  HD recliner consistently for past 3 weeks - tentatively accepted back to outpatient HD unit . Which dialysis unit she goes to is dependent on which dialysis is closest to SNF to which she is discharged (Clarified with Dr. GMoshe Cipro10/02/2021) She needs SNF. Melena/Rectal bleeding - GI consulted. Hx rectal ulcers.   EGD 7/27 normal, flex sig with mucosal ulceration c/w sterocoral ulcers, non bleeding. Hgb 8s, stable. ?Pneumonia/?Bacteremia: On IV abx - per primary. ESRD: Back to usual MWF schedule - Next HD 08/04/2021 Access Issues/AVG clotted: Had f'gram  6/23 /22 which showed chronic thrombus and was started on Eliquis. AVG clotted 06/27/21, unable to be declotted d/t arm contracture. S/P conversion to tunneled HD catheter 07/02/21.  Hypertension/volume: BP variable, on midodrine '10mg'$  TID. More edema recently, UF as tolerated. Adding holding parameters for midodrine not to give is SBP over 140. 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 MBD: CorrCa slightly high, Phos low. No binder or VDRA needed Nutrition: Alb very low. CoreTrac removed, continue to encourage full meals and PO supplements. We talked w/ her and told her that a PEG tube would help her get home potentially and she said she would agree to have a feeding tube. Not sure if this is still needed but if it is, worth asking her about it again. Have d/w pmd.  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 Psych eval from 9/14 determined that patient has functional mental capacity for medical decision-making. Psych re-consulted 9/23 to evaluate the patient again - at this point, patient does not have the capacity to make medical decisions. Ongoing discussions with patient's son continues. Dispo: DNR/DNI. Poor prognosis. Although patient is currently tolerating being in the chair for HD and we do not believe dialysis is adding to her quality of life but that is for her to decide. Would like to get her out of the hospital.  RiJimmye NormanBrown NP-C 08/03/2021, 11:33 AM  [facility] [phone] seen, examined and agree w assess/plan as above with additions as indicated.  [facility] 08/03/2021, 3:10 PM

## 2021-08-04 ENCOUNTER — Inpatient Hospital Stay (HOSPITAL_COMMUNITY): Payer: Medicare Other

## 2021-08-04 ENCOUNTER — Inpatient Hospital Stay (HOSPITAL_COMMUNITY)
Admit: 2021-08-04 | Discharge: 2021-08-04 | Disposition: A | Discharge status: Home / Self Care | Payer: Medicare Other | Attending: Nurse Practitioner | Admitting: Nurse Practitioner

## 2021-08-04 DIAGNOSIS — I6330 Cerebral infarction due to thrombosis of unspecified cerebral artery: Secondary | ICD-10-CM | POA: Diagnosis not present

## 2021-08-04 DIAGNOSIS — G9340 Encephalopathy, unspecified: Secondary | ICD-10-CM | POA: Insufficient documentation

## 2021-08-04 DIAGNOSIS — D638 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: Secondary | ICD-10-CM | POA: Diagnosis not present

## 2021-08-04 DIAGNOSIS — Z794 Long term (current) use of insulin: Secondary | ICD-10-CM | POA: Diagnosis not present

## 2021-08-04 DIAGNOSIS — F0280 Dementia in other diseases classified elsewhere without behavioral disturbance: Secondary | ICD-10-CM | POA: Diagnosis not present

## 2021-08-04 LAB — BASIC METABOLIC PANEL
Anion gap: 11 (ref 5–15)
BUN: 13 mg/dL (ref 8–23)
CO2: 22 mmol/L (ref 22–32)
Calcium: 8 mg/dL — ABNORMAL LOW (ref 8.9–10.3)
Chloride: 94 mmol/L — ABNORMAL LOW (ref 98–111)
Creatinine, Ser: 3.47 mg/dL — ABNORMAL HIGH (ref 0.44–1.00)
GFR, Estimated: 13 mL/min — ABNORMAL LOW (ref >60)
Glucose, Bld: 66 mg/dL — ABNORMAL LOW (ref 70–99)
Potassium: 3.4 mmol/L — ABNORMAL LOW (ref 3.5–5.1)
Sodium: 127 mmol/L — ABNORMAL LOW (ref 135–145)

## 2021-08-04 LAB — CBC WITH DIFFERENTIAL/PLATELET
Abs Immature Granulocytes: 0.3 10*3/uL — ABNORMAL HIGH (ref 0.00–0.07)
Basophils Absolute: 0.1 10*3/uL (ref 0.0–0.1)
Basophils Relative: 0 %
Eosinophils Absolute: 0.4 10*3/uL (ref 0.0–0.5)
Eosinophils Relative: 2 %
HCT: 21.1 % — ABNORMAL LOW (ref 36.0–46.0)
Hemoglobin: 6.5 g/dL — CL (ref 12.0–15.0)
Immature Granulocytes: 1 %
Lymphocytes Relative: 9 %
Lymphs Abs: 2 10*3/uL (ref 0.7–4.0)
MCH: 27.3 pg (ref 26.0–34.0)
MCHC: 30.8 g/dL (ref 30.0–36.0)
MCV: 88.7 fL (ref 80.0–100.0)
Monocytes Absolute: 1.7 10*3/uL — ABNORMAL HIGH (ref 0.1–1.0)
Monocytes Relative: 8 %
Neutro Abs: 16.5 10*3/uL — ABNORMAL HIGH (ref 1.7–7.7)
Neutrophils Relative %: 80 %
Platelets: 174 10*3/uL (ref 150–400)
RBC: 2.38 MIL/uL — ABNORMAL LOW (ref 3.87–5.11)
RDW: 17.2 % — ABNORMAL HIGH (ref 11.5–15.5)
WBC: 20.8 10*3/uL — ABNORMAL HIGH (ref 4.0–10.5)
nRBC: 0.7 % — ABNORMAL HIGH (ref 0.0–0.2)

## 2021-08-04 LAB — GLUCOSE, CAPILLARY
Glucose-Capillary: 74 mg/dL (ref 70–99)
Glucose-Capillary: 144 mg/dL — ABNORMAL HIGH (ref 70–99)
Glucose-Capillary: 108 mg/dL — ABNORMAL HIGH (ref 70–99)
Glucose-Capillary: 80 mg/dL (ref 70–99)

## 2021-08-04 LAB — PREPARE RBC (CROSSMATCH)

## 2021-08-04 LAB — HEPARIN LEVEL (UNFRACTIONATED): Heparin Unfractionated: 0.24 IU/mL — ABNORMAL LOW (ref 0.30–0.70)

## 2021-08-04 MED ORDER — ACETAMINOPHEN 325 MG PO TABS
650.0000 mg | ORAL_TABLET | Freq: Once | ORAL | Status: DC
Start: 2021-08-04 — End: 2021-08-05
  Filled 2021-08-04: qty 2

## 2021-08-04 MED ORDER — LIDOCAINE HCL (PF) 1 % IJ SOLN: 5.0000 mL | INTRAMUSCULAR | Status: DC | PRN

## 2021-08-04 MED ORDER — DEXTROSE 50 % IV SOLN
INTRAVENOUS | Status: AC
  Administered 2021-08-04: 50 mL
  Filled 2021-08-04: qty 50

## 2021-08-04 MED ORDER — SODIUM CHLORIDE 0.9 % IV SOLN: 100.0000 mL | INTRAVENOUS | Status: DC | PRN

## 2021-08-04 MED ORDER — PENTAFLUOROPROP-TETRAFLUOROETH EX AERO: 1.0000 "application " | INHALATION_SPRAY | CUTANEOUS | Status: DC | PRN

## 2021-08-04 MED ORDER — ALTEPLASE 2 MG IJ SOLR: 2.0000 mg | Freq: Once | INTRAMUSCULAR | Status: DC | PRN

## 2021-08-04 MED ORDER — LIDOCAINE-PRILOCAINE 2.5-2.5 % EX CREA: 1.0000 "application " | TOPICAL_CREAM | CUTANEOUS | Status: DC | PRN

## 2021-08-04 MED ORDER — SODIUM CHLORIDE 0.9% IV SOLUTION: Freq: Once | INTRAVENOUS | Status: DC

## 2021-08-04 MED ORDER — DIPHENHYDRAMINE HCL 25 MG PO CAPS: 25.0000 mg | ORAL_CAPSULE | Freq: Once | ORAL | Status: DC

## 2021-08-04 MED ORDER — DIPHENHYDRAMINE HCL 50 MG/ML IJ SOLN
12.5000 mg | Freq: Once | INTRAMUSCULAR | Status: AC
  Administered 2021-08-04: 12.5 mg via INTRAVENOUS
  Filled 2021-08-04: qty 1

## 2021-08-04 MED ORDER — IOHEXOL 300 MG/ML  SOLN
100.0000 mL | Freq: Once | INTRAMUSCULAR | Status: AC | PRN
  Administered 2021-08-04: 100 mL via INTRAVENOUS

## 2021-08-04 NOTE — Progress Notes (Addendum)
LTM EEG hooked up and running - no initial skin breakdown - push button tested - neuro notified. [facility] monitoring.  

## 2021-08-04 NOTE — Progress Notes (Signed)
[facility] Progress Note   Subjective: Seen in room. Lying in bed, not interactive this am. Hgb 6.5 -transfuse with dialysis today.   Objective Vitals:   08/04/21 0446 08/04/21 0447 08/04/21 0447 08/04/21 0809  BP: (!) 185/147 (!) 155/50 (!) 150/50 (!) 131/54  Pulse: 93 82 91 90  Resp: '16 17 16 20  '$ Temp: 98.4 F (36.9 C) 98.4 F (36.9 C) 98.4 F (36.9 C) 98.2 F (36.8 C)  TempSrc:  Oral    SpO2: 99% 100% 100% 98%  Weight:      Height:       Physical Exam General: Chronically ill appearing female in NAD Heart: [id number] RRR 2/6 Systolic M Lungs: CTAB  Abdomen:soft, NABS Extremities:  + UE edema, no LE edema Dialysis Access: [facility] drsg intact   Additional Objective Labs: Basic Metabolic Panel: Recent Labs  Lab 08/01/21 0348 08/03/21 0938 08/04/21 0356  NA 127* 127* 127*  K 3.6 3.4* 3.4*  CL 92* 94* 94*  CO2 '26 25 22  '$ GLUCOSE 129* 129* 66*  BUN '12 8 13  '$ CREATININE 3.16* 2.96* 3.47*  CALCIUM 8.6* 8.3* 8.0*  PHOS 2.5  --   --     Liver Function Tests: Recent Labs  Lab 07/29/21 0616 07/30/21 0300 08/01/21 0348  AST 17 15  --   ALT 9 9  --   ALKPHOS 93 102  --   BILITOT 0.4 0.6  --   PROT 5.8* 5.7*  --   ALBUMIN 2.0* 1.8* 1.8*    No results for input(s): LIPASE, AMYLASE in the last 168 hours. CBC: Recent Labs  Lab 07/30/21 0300 07/31/21 0339 08/01/21 0348 08/03/21 0938 08/04/21 0356  WBC 19.9* 20.1* 18.2* 20.5* 20.8*  NEUTROABS  --   --   --   --  16.5*  HGB 7.9* 7.5* 8.0* 7.1* 6.5*  HCT 26.6* 24.6* 26.7* 23.3* 21.1*  MCV 93.0 91.1 91.4 90.0 88.7  PLT 225 188 203 194 174    Blood Culture    Component Value Date/Time   SDES BLOOD RIGHT ANTECUBITAL 07/29/2021 0902   SPECREQUEST  07/29/2021 0902    BOTTLES DRAWN AEROBIC AND ANAEROBIC Blood Culture adequate volume   CULT (A) 07/29/2021 0902    STAPHYLOCOCCUS EPIDERMIDIS THE SIGNIFICANCE OF ISOLATING THIS ORGANISM FROM A SINGLE SET OF BLOOD CULTURES WHEN MULTIPLE SETS ARE DRAWN  IS UNCERTAIN. PLEASE NOTIFY THE MICROBIOLOGY DEPARTMENT WITHIN ONE WEEK IF SPECIATION AND SENSITIVITIES ARE REQUIRED. Performed at [facility], [place] [address], [place], [place] [address]    REPTSTATUS 08/01/2021 FINAL 07/29/2021 0902    Cardiac Enzymes: No results for input(s): CKTOTAL, CKMB, CKMBINDEX, TROPONINI in the last 168 hours. CBG: Recent Labs  Lab 08/03/21 1133 08/03/21 1700 08/03/21 2057 08/04/21 0636 08/04/21 0840  GLUCAP 140* 181* 167* 74 144*    Iron Studies: No results for input(s): IRON, TIBC, TRANSFERRIN, FERRITIN in the last 72 hours. '@lablastinr3'$ @ Studies/Results: EEG adult  Result Date: 08/03/2021 Lora Havens, MD     08/03/2021  1:51 PM Patient Name: Amy [name] MRN: [id number] Epilepsy Attending: Lora Havens Referring Physician/Provider: Dr Eulogio Bear Date: 08/03/2021 Duration: 24.50mns  Patient history: 75year old female with prior strokes with altered mental status and twitching on left side of face.  EEG to evaluate for seizures.  Level of alertness: Awake  AEDs during EEG study: GBP  Technical aspects: This EEG study was done with scalp electrodes positioned according to the 10-20 International system of electrode placement. EDealer  activity was acquired at a sampling rate of '500Hz'$  and reviewed with a high frequency filter of '70Hz'$  and a low frequency filter of '1Hz'$ . EEG data were recorded continuously and digitally stored.  Description: This EEG study was done with scalp electrodes positioned according to the 10-20 International system of electrode placement. Electrical activity was acquired at a sampling rate of '500Hz'$  and reviewed with a high frequency filter of '70Hz'$  and a low frequency filter of '1Hz'$ . EEG data were recorded continuously and digitally stored.  Description: EEG showed continuous generalized and lateralized right hemisphere 3-'5hz'$  theta-delta slowing which at times appear sharply contoured, specially when stimulated.  Hyperventilation and photic stimulation were not performed.    ABNORMALITY -Continuous slow, generalized and lateralized right hemisphere  IMPRESSION: This study is suggestive of cortical dysfunction arising from right hemisphere likely secondary to underlying structural abnormality/stroke.  There is also moderate diffuse encephalopathy, nonspecific etiology.  No seizures or definite epileptiform discharges were seen throughout the recording. If concern for ictal-interical activity persists, please consider long term monitoring.  Priyanka Barbra Sarks   Medications:  ceFEPime (MAXIPIME) IV 1 g (08/03/21 1507)    sodium chloride   Intravenous Once   sodium chloride   Intravenous Once   acetaminophen  650 mg Oral Once   atorvastatin  40 mg Oral Daily   chlorhexidine  15 mL Mouth Rinse BID   Chlorhexidine Gluconate Cloth  6 each Topical Daily   Chlorhexidine Gluconate Cloth  6 each Topical Q0600   darbepoetin (ARANESP) injection - DIALYSIS  200 mcg Intravenous Q Fri-HD   diphenhydrAMINE  25 mg Oral Once   feeding supplement  237 mL Oral BID BM   gabapentin  100 mg Oral Q12H   insulin aspart  0-6 Units Subcutaneous TID AC & HS   lactulose  20 g Oral Daily   mouth rinse  15 mL Mouth Rinse q12n4p   midodrine  10 mg Oral TID WC   mirtazapine  7.5 mg Oral QHS   multivitamin  1 tablet Oral QHS   OLANZapine zydis  2.5 mg Oral QHS   pantoprazole  40 mg Oral Daily   sodium chloride flush  10-40 mL Intracatheter Q12H   thiamine  100 mg Oral Daily   venlafaxine  37.5 mg Oral BID     Dialysis Orders: AF MWF  3h 65mn 400/500 62.5kg 3K/2.5 Ca P2 AVG HeRO   Hep none - Mircera 50 q 2 wks  (7/13)  Venofer 50 q wk - Parsabiv '5mg'$  [facility]   Assessment/Plan: AMS/FTT: Mental status that waxes/wanes.  HD recliner consistently for past 3 weeks - tentatively accepted back to outpatient HD unit . Which dialysis unit she goes to is dependent on which dialysis is closest to SNF to which she  is discharged (Clarified with Dr. GMoshe Cipro10/02/2021) She needs SNF. Melena/Rectal bleeding - GI consulted. Hx rectal ulcers.  EGD 7/27 normal, flex sig with mucosal ulceration c/w sterocoral ulcers, non bleeding. Hgb 8s---dropped to 6.5 on 10/10. Transfuse today  ?Pneumonia/?Bacteremia: On IV abx - per primary. Blood cultures +S. Epidermidis. Likely contaminant.  ESRD: Back to usual MWF schedule - Next HD 08/04/2021 Access Issues/AVG clotted: Had f'gram  6/23 /22 which showed chronic thrombus and was started on Eliquis. AVG clotted 06/27/21, unable to be declotted d/t arm contracture. S/P conversion to tunneled HD catheter 07/02/21.  Hypertension/volume: BP variable, on midodrine '10mg'$  TID. More edema recently, UF as tolerated. Adding holding parameters for  midodrine not to give is SBP over 140. Anemia of ESRD:  continue Aranesp 210mg q Fri. Transfuse 1 unit prbcs today.  MBD: CorrCa slightly high, Phos low. No binder or VDRA needed Nutrition: Alb very low. CoreTrac removed, continue to encourage full meals and PO supplements.  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 Psych eval from 9/14 determined that patient has functional mental capacity for medical decision-making. Psych re-consulted 9/23 to evaluate the patient again - at this point, patient does not have the capacity to make medical decisions. Ongoing discussions with patient's son continues. Dispo: DNR/DNI. Poor prognosis. Although patient is currently tolerating being in the chair for HD and we do not believe dialysis is adding to her quality of life but that is for her to decide. Would like to get her out of the hospital.  OLynnda ChildPA-C [facility] 08/04/2021,8:52 AM

## 2021-08-04 NOTE — Plan of Care (Signed)
  Problem: Nutrition: Goal: Adequate nutrition will be maintained Outcome: Progressing   Problem: Safety: Goal: Ability to remain free from injury will improve Outcome: Progressing   Problem: Skin Integrity: Goal: Risk for impaired skin integrity will decrease Outcome: Progressing   

## 2021-08-04 NOTE — Plan of Care (Signed)
Neurology plan of care:  Called by Dr. Eliseo Squires to discuss patient and her spot EEG results which showed "This study is suggestive of cortical dysfunction arising from right hemisphere likely secondary to underlying structural abnormality/stroke.  There is also moderate diffuse encephalopathy, nonspecific etiology.  No seizures or definite epileptiform discharges were seen throughout the recording.  If concern for ictal-interical activity persists, please consider long term monitoring.   The patient has not been observed having any seizure like activity. Due to underlying structural abnormality with stroke, will get cEEG overnight and see the patient tomorrow after we f/up read. No AEDs are needed at this point.   Discussed with Dr Theda Sers.   Clance Boll, MSN, APN-BC Neurology Nurse Practitioner Pager [phone]

## 2021-08-04 NOTE — Progress Notes (Addendum)
[facility] PROGRESS NOTE  Amy [name] [id number] DOB: 18-Aug-1946 DOA: 05/19/2021 PCP: Amy Maw, MD  Status:  Remains inpatient appropriate because:Unsafe d/c plan, IV treatments appropriate due to intensity of illness or inability to take PO, and Inpatient level of care appropriate due to severity of illness  Dispo: The patient is from: Home              Anticipated d/c is to: Home patient does not have funding to be placed in SNF              Patient currently is not medically stable to d/c.  Currently requiring IV antibiotic for pneumonia   Difficult to place patient Yes              Barriers to discharge: Currently requiring IV antibiotic to treat pneumonia, also son is trying to decide whether he wants to have feeding tube placed in patient    Level of care: Med-Surg  Code Status: DNR Family communication: Spoke with patient's son on 9/29.  DVT prophylaxis: Heparin COVID vaccination status: Amy [name] 02/26/2020, Pfizer 04/10/2021    HPI: 75 y.o. 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 Patient presented to the ED on 7/25 with dark stool.  On presentation, hemoglobin was 9.6; GI was consulted.  She underwent EGD which was normal and flexible sigmoidoscopy which showed nonbleeding distal rectal ulcers/stercoral ulcers.  Subsequently, GI signed off.  Neurology was also consulted for altered mental status; MRI brain was similar to prior MRI at [facility] with a large right MCA stroke.  EEG this admission unremarkable. Nephrology has been following for dialysis needs. PEG tube was desired by family but could not be placed because of overlying transverse colon.   General surgery was consulted for G-tube placement but patient does not want a G-tube and  was deemed to have capacity by psych team.  Subsequently after further more detailed evaluations and observation it was finally clarified that she lacked capacity.  Surgical team was consulted the fact that the patient lacks underlying dysphagia and even though she lacks capacity continues to express a desire to not have a gastrostomy tube placed they are reluctant to proceed with surgery and have ordered follow-up SLP evaluation regarding dysphagia.  As of [id number] son still conflicted over whether or not to place GT and will use the next few days to discuss with other family members who will help him come to a decision.  As of 10/10 patient continued to have leukocytosis in the 20,000 range.  Infectious disease work-up initiated.  Contrasted CT of pelvis revealed no abscess or other concerns around the area of the decubitus.  Repeat blood cultures pending.  Because of ongoing altered mental status over the past 48 hours with negative EEG CT of the head was obtained that revealed possible changes concerning for acute versus subacute stroke therefore MRI ordered.   Subjective: Waxing and waning mentation.  For the most part responds only to pain although did wake up more for attending physician and attempt to verbalize.  This is not her baseline.  Objective: Vitals:   08/04/21 0447 08/04/21 0447  BP: (!) 155/50 (!) 150/50  Pulse: 82 91  Resp: 17 16  Temp: 98.4 F (36.9  C) 98.4 F (36.9 C)  SpO2: 100% 100%    Intake/Output Summary (Last 24 hours) at 08/04/2021 0758 Last data filed at 08/04/2021 0447 Gross per 24 hour  Intake 468.02 ml  Output 0 ml  Net 468.02 ml   Filed Weights   08/01/21 0528 08/01/21 0814 08/01/21 1220  Weight: 98.4 kg 68 kg 65 kg    Exam:  Constitutional: NAD, much more lethargic than baseline and per my examination only awakens to pain without any intelligible  speech Respiratory: Anterior lung sounds are clear to auscultation and diminished in the bases.  No increased work of breathing, no hypoxemia with pulse oximetry reading to 100% Cardiovascular: Heart sounds S1-S2 without any murmur, normotensive, no peripheral edema other than dependent edema on stroke side Abdomen: LBM 10/10-still not eating.  Abdomen soft nontender nondistended.  D10 infusion discontinued on 10/8 Neurologic: CN 2-12 grossly intact on visual inspection-at baseline-Strength 1/5 on the right-unable to use her right arm with purpose due to significant dysmetria. 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  DOES NOT HAVE CAPACITY.  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 D10 infusion discontinued on 10/8.  Patient would not discharge home on this especially if G-tube were not considered and we need to get an accurate picture of her status off of fluid No significant hypoglycemia off D10 so doubt this is contributing to altered mentation-it does appear patient may have had another acute stroke-work-up in progress noting stat CT concerning for fronto parietal junction acute versus subacute infarct on the right Son remains conflicted over whether he should  place a G-tube-discussions will be held next week after patient consults other family who can assist him in the decision-making process -As of 10/10 patient is now refusing to take all medications by mouth  Acute on chronic lower GI bleeding from stercoral ulcers -Baseline hemoglobin 7-8 but has decreased to 6.5 on 10/10 therefore will give 1 unit PRBC -No obvious bleeding seen but as a precaution we will discontinue heparin infusion-also possible acute vs subacute stroke so need MRI before can resume heparin   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  Surmise this lady had dementia that was undetected prior to admission and given recent stroke and change in environment the dementia has worsened -Continue olanzapine at at bedtime  ESRD on HD Continue HD MWF  Hyperkalemia/hypophosphatemia Management per nephrology HD -supplementation as needed   Hypotension Blood pressure currently is stable on midodrine.  Constipation Patient was on chronic narcotics prior to admission and these remain in place Continue Chronulac daily  Type 2 diabetes mellitus Episodes of hypoglycemia Hemoglobin A1c 5.1.  Diet  controlled at baseline but currently refusing all oral diet  Chronic thrombus left upper extremity Continue with heparin on hold as of around 7 AM on 10/10 given anemia-also acute altered mentation consistent with possible stroke so need MRI to better clarify as well.  Stage II mid coccyx pressure injury, present on admission Continue wound care 10/3 continues to have some purulent drainage so this will need to be evaluated more closely may require wound care nurse assessment     GERD Continue Protonix and Pepcid     Data Reviewed: Basic Metabolic Panel: Recent Labs  Lab 07/29/21 0616 07/30/21 0300 08/01/21 0348 08/03/21 0938 08/04/21 0356  NA 129* 126* 127* 127* 127*  K 3.8 4.1 3.6 3.4* 3.4*  CL 93* 90* 92* 94* 94*  CO2 '28 27 26 25 22  '$ GLUCOSE 122* 163* 129* 129* 66*  BUN 7* '11 12 8 13  '$ CREATININE 2.72* 3.27* 3.16* 2.96* 3.47*  CALCIUM 8.4* 8.2* 8.6* 8.3* 8.0*  PHOS  --   --  2.5  --   --    Liver Function Tests: Recent Labs  Lab 07/29/21 0616 07/30/21 0300 08/01/21 0348  AST 17 15  --   ALT 9 9  --   ALKPHOS 93 102  --   BILITOT 0.4 0.6  --   PROT 5.8* 5.7*  --   ALBUMIN 2.0* 1.8* 1.8*   No results for input(s): LIPASE, AMYLASE in the last 168 hours. Recent Labs  Lab 08/03/21 1022  AMMONIA 33   CBC: Recent Labs  Lab 07/30/21 0300 07/31/21 0339 08/01/21 0348 08/03/21 0938 08/04/21 0356  WBC 19.9* 20.1* 18.2* 20.5* 20.8*  NEUTROABS  --   --   --   --  16.5*  HGB 7.9* 7.5* 8.0* 7.1* 6.5*  HCT 26.6* 24.6* 26.7* 23.3* 21.1*  MCV 93.0 91.1 91.4 90.0 88.7  PLT 225 188 203 194 174    CBG: Recent Labs  Lab 08/03/21 0642 08/03/21 1133 08/03/21 1700 08/03/21 2057 08/04/21 0636  GLUCAP 134* 140* 181* 167* 74     Scheduled Meds:  sodium chloride   Intravenous Once   acetaminophen  650 mg Oral Once   atorvastatin  40 mg Oral Daily   chlorhexidine  15 mL Mouth Rinse BID   Chlorhexidine Gluconate Cloth  6 each Topical Daily   Chlorhexidine Gluconate Cloth  6 each Topical Q0600   darbepoetin (ARANESP) injection - DIALYSIS  200 mcg Intravenous Q Fri-HD   dextrose       diphenhydrAMINE  25 mg Oral Once   feeding supplement  237 mL Oral BID BM   gabapentin  100 mg Oral Q12H   insulin aspart  0-6 Units Subcutaneous TID AC & HS   lactulose  20 g Oral Daily   mouth rinse  15 mL Mouth Rinse q12n4p   midodrine  10 mg Oral TID WC   mirtazapine  7.5 mg Oral QHS   multivitamin  1 tablet Oral QHS   OLANZapine zydis  2.5 mg Oral  QHS   pantoprazole  40 mg Oral Daily   sodium chloride flush  10-40 mL Intracatheter Q12H   thiamine  100 mg Oral Daily   venlafaxine  37.5 mg Oral BID   Continuous Infusions:  ceFEPime (MAXIPIME) IV 1 g (08/03/21 1507)   heparin 1,100 Units/hr (08/03/21 1718)    Principal Problem:   GI bleed Active Problems:   Essential hypertension   Type 2 diabetes mellitus with chronic kidney disease on chronic  dialysis, with long-term current use of insulin (HCC)   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   ESRD on dialysis ([place])   Dyslipidemia   Hypokalemia   Stage 2 skin ulcer of sacral region [facility])   Cerebral thrombosis with cerebral infarction   Protein-calorie malnutrition, severe   Acute GI bleeding   Oropharyngeal dysphagia   Failure to thrive in adult   Inadequate oral intake   Clogged feeding tube   Fever   Rectal bleeding   Delirium due to another medical condition   Neurogenic dysphagia   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

## 2021-08-04 NOTE — Progress Notes (Signed)
Pt is at dialysis will try back later for EEG over night hook up.

## 2021-08-04 NOTE — Progress Notes (Addendum)
  Patient seen and examined at bedside  -will awaken but does not speak  10/4: blood cultures are showing gram-positive cocci which most likely is a contaminate.  WBC count remains up. Pelvic CT negative for abscess -ammonia level normal -CT scan shows: Area of hypodensity in the right posterior frontal and anterior parietal lobe, which does not specifically correspond to an area of infarct on the prior exams and may reflect additional acute or subacute infarct.  -MRI pending Will continue to work with family/son regarding [facility] (pre chart review, patient does not have capacity).  Not eating, on HD, placement issues as well.    Eulogio Bear DO  MRI shows: Cortical restricted diffusion in the right posterior frontal, posterior temporal, parietal, and occipital lobes, concerning for acute and/or subacute infarcts. -discussed with Dr. Leonie Man who has seen patient during the earlier part of her hospitalization.  MRI more suggestive of seizures so will get LTM stated today.  Neurology will see patient tomm after LTM with recommendations.

## 2021-08-04 NOTE — TOC Progression Note (Signed)
Transition of Care [facility]) - Progression Note    Patient Details  Name: Amy [name] MRN: [id number] Date of Birth: 06-Apr-1946  Transition of Care [facility]) CM/SW [place], RN Phone Number: 08/04/2021, 10:40 AM  Clinical Narrative:    CM and MSW with DTP Team will continue to follow the patient for transitions of care needs.  CM spoke with Erin Hearing, NP this morning and Lissa Merlin, NP left a voicemail with the patient's son regarding patient's medical condition.  Per nursing this weekend - the patient is no longer receiving IVF with dextrose, Cortrak not present, and the patient has been nonverbal and not not interactive with the nursing staff this weekend.  Palliative Care continues to follow the patient for palliative care discussions along with Lissa Merlin, NP.  CM and MSW with DTP Team will continue to support the patient / family with transitions of care needs.   Expected Discharge Plan:  (To be determined.) Barriers to Discharge: Continued Medical Work up  Expected Discharge Plan and Services Expected Discharge Plan:  (To be determined.) In-house Referral: Clinical Social Work     Living arrangements for the past 2 months: [place] (Patient came to hospital from SNF [facility])                                       Social Determinants of Health (SDOH) Interventions    Readmission Risk Interventions Readmission Risk Prevention Plan 08/04/2021  Transportation Screening Complete  Medication Review [employer]) Complete  PCP or Specialist appointment within 3-5 days of discharge Complete  HRI or Home Care Consult Complete  SW Recovery Care/Counseling Consult Complete  Palliative Care Screening Complete  Skilled Nursing Facility Complete  Some recent data might be hidden

## 2021-08-04 NOTE — Progress Notes (Signed)
During the night, the patient was non verbal and not interactive with staff.  She was not following orders and her gaze stayed to the right.  She does react to pain of injections or repositioning in the bed with moaning.  Unable to give any medications by mouth.  She is refusing to open mouth and will not swallow.  Worried about the risk of aspiration.  Will continue to monitor patient.  Earleen Reaper RN

## 2021-08-04 NOTE — Progress Notes (Signed)
   08/04/21 0513  Provider Notification  Provider Name/Title Dr. Tonie Griffith  Date Provider Notified 08/04/21  Time Provider Notified [phone]  Notification Type Page  Notification Reason Critical result  Test performed and critical result Hgb 6.5 Hct 21.1  Date Critical Result Received 08/04/21  Provider response See new orders  Date of Provider Response 08/04/21   Received critical hgb of 6.5.  Dr. Tonie Griffith made aware.  No obvious signs of bleeding.  New orders received.  Will continue to monitor patient.  Earleen Reaper RN

## 2021-08-04 NOTE — Progress Notes (Signed)
Hypoglycemic Event  CBG: 74  Treatment: D50 50 mL (25 gm)  Symptoms: None  Follow-up CBG: Time:08:30am CBG Result:144  Possible Reasons for Event: Inadequate meal intake  Comments/MD notified:MD Vann     Jetty Berland K Chauntae Hults

## 2021-08-05 DIAGNOSIS — G9340 Encephalopathy, unspecified: Secondary | ICD-10-CM | POA: Diagnosis not present

## 2021-08-05 DIAGNOSIS — F0280 Dementia in other diseases classified elsewhere without behavioral disturbance: Secondary | ICD-10-CM | POA: Diagnosis not present

## 2021-08-05 DIAGNOSIS — I6330 Cerebral infarction due to thrombosis of unspecified cerebral artery: Secondary | ICD-10-CM | POA: Diagnosis not present

## 2021-08-05 DIAGNOSIS — F05 Delirium due to known physiological condition: Secondary | ICD-10-CM | POA: Diagnosis not present

## 2021-08-05 LAB — CBC WITH DIFFERENTIAL/PLATELET
Abs Immature Granulocytes: 0.27 10*3/uL — ABNORMAL HIGH (ref 0.00–0.07)
Basophils Absolute: 0.1 10*3/uL (ref 0.0–0.1)
Basophils Relative: 0 %
Eosinophils Absolute: 0.2 10*3/uL (ref 0.0–0.5)
Eosinophils Relative: 1 %
HCT: 29.7 % — ABNORMAL LOW (ref 36.0–46.0)
Hemoglobin: 9.6 g/dL — ABNORMAL LOW (ref 12.0–15.0)
Immature Granulocytes: 1 %
Lymphocytes Relative: 7 %
Lymphs Abs: 1.8 10*3/uL (ref 0.7–4.0)
MCH: 28.2 pg (ref 26.0–34.0)
MCHC: 32.3 g/dL (ref 30.0–36.0)
MCV: 87.1 fL (ref 80.0–100.0)
Monocytes Absolute: 1.9 10*3/uL — ABNORMAL HIGH (ref 0.1–1.0)
Monocytes Relative: 7 %
Neutro Abs: 21 10*3/uL — ABNORMAL HIGH (ref 1.7–7.7)
Neutrophils Relative %: 84 %
Platelets: 180 10*3/uL (ref 150–400)
RBC: 3.41 MIL/uL — ABNORMAL LOW (ref 3.87–5.11)
RDW: 17.8 % — ABNORMAL HIGH (ref 11.5–15.5)
WBC: 25.2 10*3/uL — ABNORMAL HIGH (ref 4.0–10.5)
nRBC: 0.6 % — ABNORMAL HIGH (ref 0.0–0.2)

## 2021-08-05 LAB — BPAM RBC
Blood Product Expiration Date: 202210172359
ISSUE DATE / TIME: 202210101733
Unit Type and Rh: 5100

## 2021-08-05 LAB — GLUCOSE, CAPILLARY
Glucose-Capillary: 138 mg/dL — ABNORMAL HIGH (ref 70–99)
Glucose-Capillary: 111 mg/dL — ABNORMAL HIGH (ref 70–99)
Glucose-Capillary: 115 mg/dL — ABNORMAL HIGH (ref 70–99)
Glucose-Capillary: 117 mg/dL — ABNORMAL HIGH (ref 70–99)
Glucose-Capillary: 112 mg/dL — ABNORMAL HIGH (ref 70–99)

## 2021-08-05 LAB — TYPE AND SCREEN
ABO/RH(D): O POS
Antibody Screen: NEGATIVE
Unit division: 0

## 2021-08-05 NOTE — Progress Notes (Signed)
Patient was D/C. [facility] notified no skin break down noted.

## 2021-08-05 NOTE — Progress Notes (Signed)
  Patient seen and examined at bedside -more awake today but does not say words just moans  10/4: blood cultures are showing gram-positive cocci which most likely is a contaminate.  WBC count remains up. Pelvic CT negative for abscess -ammonia level normal -CT scan shows: Area of hypodensity in the right posterior frontal and anterior parietal lobe, which does not specifically correspond to an area of infarct on the prior exams and may reflect additional acute or subacute infarct.  -MRI: Cortical restricted diffusion in the right posterior frontal, posterior temporal, parietal, and occipital lobes, concerning for acute and/or subacute infarcts. -discussed with Dr. Leonie Man who has seen patient during the earlier part of her hospitalization.  MRI more suggestive of seizures not necessarily new CVA so suggested LTM.   -per neurology no needs for AED as EEG not suggestive of seizures  continue to work with family/son regarding [facility] (pre chart review, patient does not have capacity).  Not eating, on HD, placement issues as well.    Eulogio Bear DO

## 2021-08-05 NOTE — Progress Notes (Addendum)
[facility] PROGRESS NOTE  Amy [name] [id number] DOB: [birth year] DOA: 05/19/2021 PCP: [name], MD  Status:  Remains inpatient appropriate because:Unsafe d/c plan, IV treatments appropriate due to intensity of illness or inability to take PO, and Inpatient level of care appropriate due to severity of illness  Dispo: The patient is from: Home              Anticipated d/c is to: Home patient does not have funding to be placed in SNF              Patient currently is not medically stable to d/c.  Currently requiring IV antibiotic for pneumonia   Difficult to place patient Yes              Barriers to discharge: Currently requiring IV antibiotic to treat pneumonia, also son is trying to decide whether he wants to have feeding tube placed in patient    Level of care: Med-Surg  Code Status: DNR Family communication: Spoke with patient's son on 10/11 and updated on significant changes in his mother's status including new stroke DVT prophylaxis: Heparin COVID vaccination status: [name] 02/26/2020, Pfizer 04/10/2021    HPI: 75 y.o. 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 Patient presented to the ED on 7/25 with dark stool.  On presentation, hemoglobin was 9.6; GI was consulted.  She underwent EGD which was normal and flexible sigmoidoscopy which showed nonbleeding distal rectal ulcers/stercoral ulcers.  Subsequently, GI signed off.  Neurology was also consulted for altered mental status; MRI brain was similar to prior MRI at [facility] with a large right MCA stroke.  EEG this admission unremarkable. Nephrology has been following for dialysis needs. PEG tube was  desired by family but could not be placed because of overlying transverse colon.  General surgery was consulted for G-tube placement but patient does not want a G-tube and  was deemed to have capacity by psych team.  Subsequently after further more detailed evaluations and observation it was finally clarified that she lacked capacity.  Surgical team was consulted the fact that the patient lacks underlying dysphagia and even though she lacks capacity continues to express a desire to not have a gastrostomy tube placed they are reluctant to proceed with surgery and have ordered follow-up SLP evaluation regarding dysphagia.  As of [id number] son still conflicted over whether or not to place GT and will use the next few days to discuss with other family members who will help him come to a decision.  As of 10/10 patient continued to have leukocytosis in the 20,000 range.  Infectious disease work-up initiated.  Contrasted CT of pelvis revealed no abscess or other concerns around the area of the decubitus.  Repeat blood cultures pending.  Because of ongoing altered mental status over the past 48 hours with negative EEG CT of the head was obtained that revealed possible changes concerning for acute versus subacute stroke.  MRI showed similar findings.  Reviewed by stroke neurologist who was concerned that changes were more consistent with seizure etiology.  LTM EEG consistent with metabolic encephalopathy and stroke physiology   Subjective: Patient awakens to voice but has no meaningful interaction.  Significant dysarthria noted.  Objective: Vitals:   08/04/21 2155  08/05/21 0403  BP: 138/78 138/63  Pulse: 98 98  Resp: 20 18  Temp: 97.8 F (36.6 C) 98.3 F (36.8 C)  SpO2:  100%    Intake/Output Summary (Last 24 hours) at 08/05/2021 0800 Last data filed at 08/05/2021 0600 Gross per 24 hour  Intake 515 ml  Output -78 ml  Net 593 ml   Filed Weights   08/01/21 0528 08/01/21 0814 08/01/21 1220  Weight: 98.4  kg 68 kg 65 kg    Exam:  Constitutional: NAD, more awake but without any meaningful interaction Respiratory: Lung sounds remain clear to auscultation and she is stable on room air without increased work of breathing.  Pulse oximetry 90% Cardiovascular: S1-S2, no peripheral edema other than dependent edema on stroke side, this is regular, currently normotensive Abdomen: LBM 10/10-still not eating.  Abdomen soft nontender nondistended.   Neurologic: CN 2-12 grossly intact on visual inspection-at baseline-today and inability to follow simple commands unable to accurately assess strength, witnessed 1 episode of subtle facial twitching during my exam Psychiatric: More awake than yesterday but interaction nonmeaningful and patient with significant dysarthria. DOES NOT HAVE CAPACITY.  Assessment/Plan: Acute problems: Sepsis physiology/RLL HCAP/Abn blood cxs c/w contaminant WBCs remain elevated today 25,000+ Not hypoxemic and MRSA PCR neg, initial blood cultures consistent with contaminant Completed 7 days of Maxipime for healthcare associated pneumonia Repeat blood cultures obtained 10/10 10/10 CT pelvis without clinical signs of abscess or infection  Failure to thrive /Generalized deconditioning  /very poor oral intake/ Patient adamantly has refused G-tube placement.  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  Surmise this lady had dementia that was undetected prior to admission and given recent stroke and change in environment the dementia has worsened -Continue olanzapine at at bedtime  ESRD on HD Continue HD MWF  Hyperkalemia/hypophosphatemia Management per nephrology HD -supplementation as needed   Hypotension Blood pressure currently is stable on midodrine.  Constipation Patient was on chronic narcotics prior to admission and these remain in place Continue Chronulac daily  Type 2 diabetes mellitus Episodes of hypoglycemia Hemoglobin A1c 5.1.  Diet controlled at baseline but currently refusing all oral diet  Chronic thrombus left upper extremity Anticoagulation on hold until patient formally evaluated by neurology  Stage II mid coccyx pressure injury, present on admission Continue wound care CT pelvis 10/10 without evidence of abscess    GERD Continue Protonix and Pepcid     Data Reviewed: Basic Metabolic Panel: Recent Labs  Lab 07/30/21 0300 08/01/21 0348 08/03/21 0938 08/04/21 0356  NA 126* 127* 127* 127*  K 4.1 3.6 3.4* 3.4*  CL 90* 92* 94* 94*  CO2 '27 26 25 22  '$ GLUCOSE 163* 129* 129* 66*  BUN '11 12 8 '$ 13  CREATININE 3.27* 3.16* 2.96* 3.47*  CALCIUM 8.2* 8.6* 8.3* 8.0*  PHOS  --  2.5  --   --    Liver Function Tests: Recent Labs  Lab 07/30/21 0300 08/01/21 0348  AST 15  --   ALT 9  --   ALKPHOS 102  --   BILITOT 0.6  --   PROT 5.7*  --   ALBUMIN 1.8* 1.8*    Recent Labs  Lab 08/03/21 1022  AMMONIA 33   CBC: Recent Labs  Lab 07/31/21 0339 08/01/21 0348 08/03/21 0938 08/04/21 0356 08/05/21 0439  WBC 20.1* 18.2* 20.5* 20.8* 25.2*  NEUTROABS   --   --   --  16.5* 21.0*  HGB 7.5* 8.0* 7.1* 6.5* 9.6*  HCT 24.6* 26.7* 23.3* 21.1* 29.7*  MCV 91.1 91.4 90.0 88.7 87.1  PLT 188 203 194 174 180    CBG: Recent Labs  Lab 08/04/21 0840 08/04/21 1144 08/04/21 2023 08/05/21 0133 08/05/21 0646  GLUCAP 144* 108* 80 138* 111*     Scheduled Meds:  sodium chloride   Intravenous Once   sodium chloride   Intravenous Once   acetaminophen  650 mg Oral Once   atorvastatin  40 mg Oral Daily   chlorhexidine  15 mL Mouth Rinse BID   Chlorhexidine Gluconate Cloth  6 each Topical Daily   Chlorhexidine Gluconate Cloth  6 each Topical Q0600   darbepoetin (ARANESP) injection - DIALYSIS  200 mcg Intravenous Q Fri-HD   feeding supplement  237 mL Oral BID BM   gabapentin  100 mg Oral Q12H   insulin aspart  0-6 Units Subcutaneous TID AC & HS   lactulose  20 g Oral Daily   mouth rinse  15 mL Mouth Rinse q12n4p   midodrine  10 mg Oral TID WC   mirtazapine  7.5 mg Oral QHS   multivitamin  1 tablet Oral QHS   OLANZapine zydis  2.5 mg Oral QHS   pantoprazole  40 mg Oral Daily   sodium chloride flush  10-40 mL Intracatheter Q12H   thiamine  100 mg Oral Daily   venlafaxine  37.5 mg Oral BID     Principal Problem:   GI bleed Active Problems:   Essential hypertension   Type 2 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, with long-term current use of insulin (HCC)   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   ESRD on dialysis (HCC)   Dyslipidemia   Hypokalemia   Stage 2 skin ulcer of sacral region ([place])   Cerebral thrombosis with cerebral infarction   Protein-calorie malnutrition, severe   Acute GI bleeding   Oropharyngeal dysphagia   Failure to thrive in adult   Inadequate oral intake   Clogged feeding tube   Fever   Rectal bleeding   Delirium due to another medical condition   Neurogenic dysphagia   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

## 2021-08-05 NOTE — Progress Notes (Signed)
[facility] Progress Note   Subjective: Seen in room. Minimally interactive this am. Completed dialysis yesterday- even UF.   Objective Vitals:   08/04/21 1919 08/04/21 1923 08/04/21 2155 08/05/21 0403  BP: (!) 117/50 (!) 117/50 138/78 138/63  Pulse: (!) 109 (!) 109 98 98  Resp: (!) 22 (!) '22 20 18  '$ Temp: 97.7 F (36.5 C) 97.7 F (36.5 C) 97.8 F (36.6 C) 98.3 F (36.8 C)  TempSrc:  Axillary  Oral  SpO2:  100%  100%  Weight:      Height:       Physical Exam General: Chronically ill appearing female in NAD Heart: [id number] RRR 2/6 Systolic M Lungs: CTAB  Abdomen:soft, NABS Extremities:  + UE edema, no LE edema Dialysis Access: [facility] drsg intact   Additional Objective Labs: Basic Metabolic Panel: Recent Labs  Lab 08/01/21 0348 08/03/21 0938 08/04/21 0356  NA 127* 127* 127*  K 3.6 3.4* 3.4*  CL 92* 94* 94*  CO2 '26 25 22  '$ GLUCOSE 129* 129* 66*  BUN '12 8 13  '$ CREATININE 3.16* 2.96* 3.47*  CALCIUM 8.6* 8.3* 8.0*  PHOS 2.5  --   --     Liver Function Tests: Recent Labs  Lab 07/30/21 0300 08/01/21 0348  AST 15  --   ALT 9  --   ALKPHOS 102  --   BILITOT 0.6  --   PROT 5.7*  --   ALBUMIN 1.8* 1.8*    No results for input(s): LIPASE, AMYLASE in the last 168 hours. CBC: Recent Labs  Lab 07/31/21 0339 08/01/21 0348 08/03/21 0938 08/04/21 0356 08/05/21 0439  WBC 20.1* 18.2* 20.5* 20.8* 25.2*  NEUTROABS  --   --   --  16.5* 21.0*  HGB 7.5* 8.0* 7.1* 6.5* 9.6*  HCT 24.6* 26.7* 23.3* 21.1* 29.7*  MCV 91.1 91.4 90.0 88.7 87.1  PLT 188 203 194 174 180    Blood Culture    Component Value Date/Time   SDES BLOOD RIGHT HAND 08/04/2021 0848   SPECREQUEST  08/04/2021 0848    BOTTLES DRAWN AEROBIC AND ANAEROBIC Blood Culture adequate volume   CULT  08/04/2021 0848    NO GROWTH < 24 HOURS Performed at [facility] [address], [place], [place] [address]    REPTSTATUS PENDING 08/04/2021 0848    Cardiac Enzymes: No results for  input(s): CKTOTAL, CKMB, CKMBINDEX, TROPONINI in the last 168 hours. CBG: Recent Labs  Lab 08/04/21 0840 08/04/21 1144 08/04/21 2023 08/05/21 0133 08/05/21 0646  GLUCAP 144* 108* 80 138* 111*    Iron Studies: No results for input(s): IRON, TIBC, TRANSFERRIN, FERRITIN in the last 72 hours. '@lablastinr3'$ @ Studies/Results: CT HEAD WO CONTRAST (5MM)  Result Date: 08/04/2021 CLINICAL DATA:  Mental status change EXAM: CT HEAD WITHOUT CONTRAST TECHNIQUE: Contiguous axial images were obtained from the base of the skull through the vertex without intravenous contrast. COMPARISON:  05/25/2021 CT head, correlation is also made with 05/27/2021 MRI brain FINDINGS: Brain: Area of increased hypodensity in the right posterior frontal and anterior parietal lobe (series 4, image 13), which may reflect sequela of prior infarct but could reflect acute or subacute infarct. Additional areas of increased hypodensity in the right thalamus and frontal, parietal, posterior temporal, and lateral occipital lobes are likely the expected evolution of infarct noted on 712022 and 05/27/2021. Periventricular white matter changes, likely the sequela of chronic small vessel ischemic disease. No acute hemorrhage, hydrocephalus, extra-axial collection, mass, mass effect, or midline shift. Vascular: No  hyperdense vessel or unexpected calcification. Skull: Normal. Negative for fracture or focal lesion. Sinuses/Orbits: Mucosal thickening in the right sphenoid sinus. Status post bilateral lens replacements. Other: Fluid in the right-greater-than-left mastoid air cells. IMPRESSION: 1. Area of hypodensity in the right posterior frontal and anterior parietal lobe, which does not specifically correspond to an area of infarct on the prior exams and may reflect additional acute or subacute infarct. An MRI is recommended for further evaluation. 2. Additional areas of increased hypodensity in the right cerebral hemisphere in areas of prior infarct  from 05/25/2021, likely expected evolution of prior infarct. No acute hemorrhage. These results were called by telephone at the time of interpretation on 08/04/2021 at 12:04 pm to provider Erin Hearing , who verbally acknowledged these results. Electronically Signed   By: Merilyn Baba M.D.   On: 08/04/2021 12:05   CT PELVIS W CONTRAST  Result Date: 08/04/2021 CLINICAL DATA:  Unexplained leukocytosis. Sacral decubitus ulcer. Evaluate for abscess. EXAM: CT PELVIS WITH CONTRAST TECHNIQUE: Multidetector CT imaging of the pelvis was performed using the standard protocol following the bolus administration of intravenous contrast. CONTRAST:  134m OMNIPAQUE IOHEXOL 300 MG/ML  SOLN COMPARISON:  None. FINDINGS: Urinary Tract:  No abnormality visualized. Bowel: Unremarkable visualized pelvic bowel loops. No evidence of perirectal/perianal abscess. Vascular/Lymphatic: No pathologically enlarged lymph nodes. No significant vascular abnormality seen. Reproductive:  Uterus and adnexa unremarkable.  No mass. Other: No free fluid or free air. Diffuse edema throughout the subcutaneous soft tissues of the pelvis and upper thighs compatible with anasarca. Musculoskeletal: No visible sacral decubitus ulcer noted. No soft tissue abscess or acute bony abnormality. No evidence of osteomyelitis. IMPRESSION: No visible abscess. Anasarca like edema throughout the subcutaneous soft tissues. Electronically Signed   By: KRolm BaptiseM.D.   On: 08/04/2021 12:14   MR BRAIN WO CONTRAST  Result Date: 08/04/2021 CLINICAL DATA:  Stroke suspected EXAM: MRI HEAD WITHOUT CONTRAST TECHNIQUE: Multiplanar, multiecho pulse sequences of the brain and surrounding structures were obtained without intravenous contrast. COMPARISON:  MRI 05/27/2021, correlation is also made with CT head 08/04/2021 FINDINGS: Brain: Cortical restricted diffusion with ADC correlate in the right posterior frontal lobe, right parietal lobe, posterior right temporal lobe,  and right occipital lobe (series 3, images 36). Some of these areas are in or closely associated with areas of remote infarct. Some of these areas are associated with T2 signal. No acute or chronic hemorrhage. Redemonstrated areas of encephalomalacia in the right frontal, temporal, parietal, and occipital lobes. Ex vacuo dilatation right lateral ventricle. No hydrocephalus. No extra-axial collection. Vascular: Normal flow voids. Skull and upper cervical spine: Normal marrow signal. Sinuses/Orbits: Negative.  Status post bilateral lens replacements. Other: Fluid throughout the bilateral mastoid air cells. IMPRESSION: Cortical restricted diffusion in the right posterior frontal, posterior temporal, parietal, and occipital lobes, concerning for acute and/or subacute infarcts. These results were called by telephone at the time of interpretation on 08/04/2021 at 3:27 pm to [facility], who verbally acknowledged these results. Electronically Signed   By: AMerilyn BabaM.D.   On: 08/04/2021 15:39   DG CHEST PORT 1 VIEW  Result Date: 08/04/2021 CLINICAL DATA:  Leukocytosis. EXAM: PORTABLE CHEST 1 VIEW COMPARISON:  Prior chest radiographs 07/29/2021 and earlier. FINDINGS: Unchanged position of a left-sided central venous catheter with tip projecting at the level of the superior cavoatrial junction. The cardiomediastinal silhouette is unchanged. Aortic atherosclerosis. Redemonstrated chronic elevation of the right hemidiaphragm. Persistent small right pleural effusion. Persistent right basilar opacity which  may reflect atelectasis and/or pneumonia. No left pleural effusion. No evidence of pneumothorax. No acute bony abnormality identified. IMPRESSION: Chronic elevation of the right hemidiaphragm. Small right pleural effusion with right basilar atelectasis and/or pneumonia, not significantly changed from the chest radiograph of 07/29/2021. Aortic Atherosclerosis (ICD10-I70.0). Electronically Signed   By: Kellie Simmering D.O.   On: 08/04/2021 09:10   EEG adult  Result Date: 08/03/2021 Lora Havens, MD     08/03/2021  1:51 PM Patient Name: ALETA SCHUCHARD MRN: [id number] Epilepsy Attending: Lora Havens Referring Physician/Provider: Dr Eulogio Bear Date: 08/03/2021 Duration: 24.42mns  Patient history: 75year old female with prior strokes with altered mental status and twitching on left side of face.  EEG to evaluate for seizures.  Level of alertness: Awake  AEDs during EEG study: GBP  Technical aspects: This EEG study was done with scalp electrodes positioned according to the 10-20 International system of electrode placement. Electrical activity was acquired at a sampling rate of '500Hz'$  and reviewed with a high frequency filter of '70Hz'$  and a low frequency filter of '1Hz'$ . EEG data were recorded continuously and digitally stored.  Description: This EEG study was done with scalp electrodes positioned according to the 10-20 International system of electrode placement. Electrical activity was acquired at a sampling rate of '500Hz'$  and reviewed with a high frequency filter of '70Hz'$  and a low frequency filter of '1Hz'$ . EEG data were recorded continuously and digitally stored.  Description: EEG showed continuous generalized and lateralized right hemisphere 3-'5hz'$  theta-delta slowing which at times appear sharply contoured, specially when stimulated. Hyperventilation and photic stimulation were not performed.    ABNORMALITY -Continuous slow, generalized and lateralized right hemisphere  IMPRESSION: This study is suggestive of cortical dysfunction arising from right hemisphere likely secondary to underlying structural abnormality/stroke.  There is also moderate diffuse encephalopathy, nonspecific etiology.  No seizures or definite epileptiform discharges were seen throughout the recording. If concern for ictal-interical activity persists, please consider long term monitoring.  Priyanka OBarbra Sarks  Medications:    sodium chloride    Intravenous Once   sodium chloride   Intravenous Once   acetaminophen  650 mg Oral Once   atorvastatin  40 mg Oral Daily   chlorhexidine  15 mL Mouth Rinse BID   Chlorhexidine Gluconate Cloth  6 each Topical Daily   Chlorhexidine Gluconate Cloth  6 each Topical Q0600   darbepoetin (ARANESP) injection - DIALYSIS  200 mcg Intravenous Q Fri-HD   feeding supplement  237 mL Oral BID BM   gabapentin  100 mg Oral Q12H   insulin aspart  0-6 Units Subcutaneous TID AC & HS   lactulose  20 g Oral Daily   mouth rinse  15 mL Mouth Rinse q12n4p   midodrine  10 mg Oral TID WC   mirtazapine  7.5 mg Oral QHS   multivitamin  1 tablet Oral QHS   OLANZapine zydis  2.5 mg Oral QHS   pantoprazole  40 mg Oral Daily   sodium chloride flush  10-40 mL Intracatheter Q12H   thiamine  100 mg Oral Daily   venlafaxine  37.5 mg Oral BID     Dialysis Orders: AF MWF  3h 476m 400/500 62.5kg 3K/2.5 Ca P2 AVG HeRO   Hep none - Mircera 50 q 2 wks  (7/13)  Venofer 50 q wk - Parsabiv '5mg'$  [facility]   Assessment/Plan: AMS/FTT: Mental status that waxes/wanes.  HD recliner consistently for past 3 weeks -  tentatively accepted back to outpatient HD unit . Which dialysis unit she goes to is dependent on which dialysis is closest to SNF to which she is discharged (Clarified with Dr. Moshe Cipro 07/30/2021) She needs SNF. Melena/Rectal bleeding - GI consulted. Hx rectal ulcers.  EGD 7/27 normal, flex sig with mucosal ulceration c/w sterocoral ulcers, non bleeding. Hgb 8s---dropped to 6.5 on 10/10. S/p prbcs on 10/10.  Leukocytosis -- ?Pneumonia/?Bacteremia: On IV abx - per primary. Blood cultures +S. Epidermidis. Likely contaminant. Repeat cultures pending  ESRD: Back to usual MWF schedule - Next HD 10/12 Access Issues/AVG clotted: Had f'gram  6/23 /22 which showed chronic thrombus and was started on Eliquis. AVG clotted 06/27/21, unable to be declotted d/t arm contracture. S/P conversion to tunneled HD  catheter 07/02/21.  Hypertension/volume: BP variable, on midodrine '10mg'$  TID. More edema recently, UF as tolerated. Adding holding parameters for midodrine not to give is SBP over 140. Anemia of ESRD:  continue Aranesp 270mg q Fri. 1 unit prbcs on 08/04/21. Hgb improved.  MBD: CorrCa slightly high, Phos low. No binder or VDRA needed Nutrition: Alb very low. CoreTrac removed, continue to encourage full meals and PO supplements.  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 ?New CVA/seizure activity -per primary  Psych eval from 9/14 determined that patient has functional mental capacity for medical decision-making. Psych re-consulted 9/23 to evaluate the patient again - at this point, patient does not have the capacity to make medical decisions. Ongoing discussions with patient's son continues. Dispo: DNR/DNI. Poor prognosis. Although patient is currently tolerating being in the chair for HD and we do not believe dialysis is adding to her quality of life but that is for her to decide. Would like to get her out of the hospital.  OLynnda ChildPA-C CNorth [place]10/08/2021,9:03 AM

## 2021-08-05 NOTE — Plan of Care (Signed)
Concern for seizures from hospitalist and we placed cEEG yesterday and we did not start AEDs.   Today's read is:  This study showed generalized periodic discharges with triphasic morphology at 0.5-1.5 Hz which can be on the ictal-interictal continuum but given the morphology, frequency and reactivity to stimulation, is more likely suggestive of toxic-metabolic causes. There is also cortical dysfunction arising from right hemisphere likely secondary to underlying stroke. Additionally there is moderate diffuse encephalopathy, nonspecific etiology but could be secondary to toxic-metabolic causes. No seizures were seen throughout the recording.   Hospitalist chatted and informed we do not need to start AEDs.   Clance Boll, MSN, APN-BC Neurology Nurse Practitioner Pager [phone]

## 2021-08-05 NOTE — Progress Notes (Signed)
Patient holding food, liquid or medication in her mouth. Will not open mouth as well when given anything in her mouth.

## 2021-08-05 NOTE — Procedures (Signed)
Patient Name: Amy [name]  MRN: [id number]  Epilepsy Attending: Lora Havens  Referring Physician/Provider: Clance Boll, NP Duration: 08/04/2021 2132 to 08/05/2021 0945   Patient history: 75 year old female with prior strokes with altered mental status and twitching on left side of face.  EEG to evaluate for seizures.   Level of alertness: awake   AEDs during EEG study: GBP   Technical aspects: This EEG study was done with scalp electrodes positioned according to the 10-20 International system of electrode placement. Electrical activity was acquired at a sampling rate of '500Hz'$  and reviewed with a high frequency filter of '70Hz'$  and a low frequency filter of '1Hz'$ . EEG data were recorded continuously and digitally stored.    Description: This EEG study was done with scalp electrodes positioned according to the 10-20 International system of electrode placement. Electrical activity was acquired at a sampling rate of '500Hz'$  and reviewed with a high frequency filter of '70Hz'$  and a low frequency filter of '1Hz'$ . EEG data were recorded continuously and digitally stored.    Description: EEG showed continuous generalized and lateralized right hemisphere 3-'5hz'$  theta-delta slowing. Generalized periodic discharges with triphasic morphology at 0.5-1.5 Hz were also noted, more prominent when awake/stimulated. Hyperventilation and photic stimulation were not performed.      ABNORMALITY -Continuous slow, generalized and lateralized right hemisphere -Periodic discharges with triphasic morphology, Generalized    IMPRESSION: This study showed generalized periodic discharges with triphasic morphology at 0.5-1.5 Hz which can be on the ictal-interictal continuum but given the morphology, frequency and reactivity to stimulation, is more likely suggestive of toxic-metabolic causes. There is also cortical dysfunction arising from right hemisphere likely secondary to underlying stroke. Additionally there is  moderate diffuse encephalopathy, nonspecific etiology but could be secondary to toxic-metabolic causes. No seizures were seen throughout the recording.    Fatma Rutten Barbra Sarks

## 2021-08-05 NOTE — Progress Notes (Signed)
Nutrition Follow-up  DOCUMENTATION CODES:  Severe malnutrition in context of chronic illness  INTERVENTION:  -Recommend family bring in food from outside hospital to feed pt -Continue Ensure Enlive po BID, each supplement provides 350 kcal and 20 grams of protein  -Continue Magic cup TID with meals, each supplement provides 290 kcal and 9 grams of protein   Recommend ongoing discussion regarding GOC. Pt is not a candidate for additional NGT placements and, per Surgery, is no longer a candidate for G-tube placement. Staff/family should continue to encourage PO intake for pt but it is likely most appropriate to transition pt to comfort care at this time.   NUTRITION DIAGNOSIS:  Severe Malnutrition related to chronic illness (ESRD on HD, prior CVA) as evidenced by percent weight loss, severe muscle depletion, energy intake < or equal to 75% for > or equal to 1 month. -- ongoing  GOAL:  Patient will meet greater than or equal to 90% of their needs -- not met  MONITOR:  PO intake, Supplement acceptance, Diet advancement, Labs, Weight trends, Skin, I & O's  REASON FOR ASSESSMENT:  Consult Enteral/tube feeding initiation and management  ASSESSMENT:  75 yo female with PMH of ESRD on HD MWF, HTN, HLD, T2DM, CVA s/p TPA 02/2021, ?blood clot in UE, left hemiparesis from CVA who presented to ER for dark stool with + fecal occult.  8/17 - cortrak placed (gastric tip) 8/19 - cortrak replaced (gastric tip) 8/29 - cortrak replaced (gastric tip) 9/07 - s/p tunneled HD cath placement 9/12 - cortrak replaced (gastric tip confirmed via xray); diet advanced to regular 9/23 - cortrak removed  Pt minimally interactive. Discussed pt with NP in detail. Per NP, pt with suspected new stroke and, if confirmed, will no longer be candidate for G-tube placement. NP attempted to discuss pt's status and poor prognosis with son and informed son that a decision regarding GOC/comfort path will need to made very soon,  especially if/when the stroke is confirmed by Neurology. Comfort path likely most appropriate for this pt given no longer candidate for NGT placements, likely no longer a candidate for G-tube placement, and continues to refuse all POs.   Medications: Scheduled Meds:  atorvastatin  40 mg Oral Daily   chlorhexidine  15 mL Mouth Rinse BID   Chlorhexidine Gluconate Cloth  6 each Topical Daily   Chlorhexidine Gluconate Cloth  6 each Topical Q0600   darbepoetin (ARANESP) injection - DIALYSIS  200 mcg Intravenous Q Fri-HD   feeding supplement  237 mL Oral BID BM   gabapentin  100 mg Oral Q12H   insulin aspart  0-6 Units Subcutaneous TID AC & HS   lactulose  20 g Oral Daily   mouth rinse  15 mL Mouth Rinse q12n4p   midodrine  10 mg Oral TID WC   mirtazapine  7.5 mg Oral QHS   multivitamin  1 tablet Oral QHS   OLANZapine zydis  2.5 mg Oral QHS   pantoprazole  40 mg Oral Daily   sodium chloride flush  10-40 mL Intracatheter Q12H   thiamine  100 mg Oral Daily   venlafaxine  37.5 mg Oral BID  Labs: Recent Labs  Lab 08/01/21 0348 08/03/21 0938 08/04/21 0356  NA 127* 127* 127*  K 3.6 3.4* 3.4*  CL 92* 94* 94*  CO2 26 25 22   BUN 12 8 13   CREATININE 3.16* 2.96* 3.47*  CALCIUM 8.6* 8.3* 8.0*  PHOS 2.5  --   --   GLUCOSE 129* 129*  66*  CBGs 80-138 x24 hours  Last HD 10/10, -38m net UF No UOP documented x24 hours I/O: +10.3L since admit  Diet Order:   Diet Order             Diet regular Room service appropriate? No; Fluid consistency: Thin  Diet effective now                  EDUCATION NEEDS:  Education needs have been addressed  Skin:  Skin Assessment: Skin Integrity Issues: Skin Integrity Issues:: Stage II Stage II: Pressure Injury on coccyx; Open wound on buttocks  Last BM:  10/110  Height:  Ht Readings from Last 1 Encounters:  05/20/21 5' 7"  (1.702 m)   Weight:  Wt Readings from Last 1 Encounters:  05/01/21 68 kg   BMI:  Body mass index is 22.44  kg/m.  Estimated Nutritional Needs:  Kcal:  1700-1900 Protein:  85-100 grams Fluid:  1L +UOP    ALarkin Ina MS, RD, LDN (she/her/hers) RD pager number and weekend/on-call pager number located in [place]

## 2021-08-06 DIAGNOSIS — I639 Cerebral infarction, unspecified: Secondary | ICD-10-CM

## 2021-08-06 LAB — GLUCOSE, CAPILLARY
Glucose-Capillary: 87 mg/dL (ref 70–99)
Glucose-Capillary: 147 mg/dL — ABNORMAL HIGH (ref 70–99)
Glucose-Capillary: 71 mg/dL (ref 70–99)
Glucose-Capillary: 83 mg/dL (ref 70–99)
Glucose-Capillary: 74 mg/dL (ref 70–99)
Glucose-Capillary: 61 mg/dL — ABNORMAL LOW (ref 70–99)
Glucose-Capillary: 76 mg/dL (ref 70–99)

## 2021-08-06 MED ORDER — DEXTROSE 50 % IV SOLN
INTRAVENOUS | Status: AC
  Administered 2021-08-06: 25 mL
  Filled 2021-08-06: qty 50

## 2021-08-06 MED ORDER — APIXABAN 5 MG PO TABS
5.0000 mg | ORAL_TABLET | Freq: Two times a day (BID) | ORAL | Status: DC
  Administered 2021-08-06 – 2021-08-07 (×2): 5 mg via ORAL
  Filled 2021-08-06 (×2): qty 1

## 2021-08-06 MED ORDER — DEXTROSE 50 % IV SOLN: 12.5000 g | INTRAVENOUS | Status: AC

## 2021-08-06 NOTE — Progress Notes (Addendum)
[facility] PROGRESS NOTE  Amy [name] [id number] DOB: 1946-09-17 DOA: 05/19/2021 PCP: Amy Maw, MD  Status:  Remains inpatient appropriate because:Unsafe d/c plan, IV treatments appropriate due to intensity of illness or inability to take PO, and Inpatient level of care appropriate due to severity of illness  Dispo: The patient is from: SNF              Anticipated d/c is to: Home with home hospice versus residential hospice              Patient currently is medically stable to d/c.  Currently requiring IV antibiotic for pneumonia   Difficult to place patient Yes              Barriers to discharge: None needs to speak with PMT to finalize hospice disposition eventually will transition to comfort care.  Dialysis has been discontinued.    Level of care: Med-Surg  Code Status: DNR Family communication: Spoke with patient's son by telephone on 10/12.  He was informed that neurology team has confirmed his mother has had another stroke despite appropriate stroke prophylaxis therapy in place.  I also told him that her mentation has not improved and that she was no longer making eye contact and today she was nonverbal and not making attempts to speak noting yesterday she had significant dysarthria and would attempt to speak.  He was also reminded of persistent leukocytosis despite appropriate treatment of healthcare acquired pneumonia.  We discussed that given her change in mentation that she is probably developed a true mechanical dysphagia and is likely aspirating her oral secretions.  I also reminded him that with acute stroke she is no longer a candidate for any surgical procedures due to increased risk for extension of current stroke or additional strokes.  [name] agrees that at this point a feeding tube is not appropriate and states he would like to review hospice options.  I also reminded him that she is no longer appropriate for hemodialysis and he agreed.  We discussed  that without dialysis given her current state at best she would live about 4 weeks.  He worked last night and will not be waking back up until about 2 or 3 PM.  Consult to palliative medicine team to further discuss options for inpatient versus outpatient hospice treatment.  Additional questions addressed.  Total time spent on the phone addressing end-of-life issues with son [name] was 20 minutes. DVT prophylaxis: Eliquis COVID vaccination status: Amy [name] Overall 02/26/2020, Pfizer 04/10/2021    HPI: 75 y.o. female with PMH significant for ESRD on HD MWF, HTN, HLD, CVA s/p TPA 02/2021 with resultant left hemiparesis, partial blood clot upper extremity, recently hospitalized to [facility] on 03/13/2021-04/22/2021 during which patient underwent flexible sigmoidoscopy on 03/26/2021 which showed multiple rectal ulcers likely secondary to trauma from enemas and constipation. Patient presented to the ED on 7/25 with dark stool.  On presentation, hemoglobin was 9.6; GI was consulted.  She underwent EGD which was normal and flexible sigmoidoscopy which showed nonbleeding distal rectal ulcers/stercoral ulcers.  Subsequently, GI signed off.  Neurology was also consulted for altered mental status; MRI brain was similar to prior MRI at [facility] with a large right MCA stroke.  EEG this admission unremarkable. Nephrology has been following for dialysis needs. PEG tube was desired by family but could not be placed because of overlying transverse colon.  General surgery was consulted for G-tube placement but patient does not want a G-tube and  was deemed to  have capacity by psych team.  Subsequently after further more detailed evaluations and observation it was finally clarified that she lacked capacity.  Surgical team was consulted the fact that the patient lacks underlying dysphagia and even though she lacks capacity continues to express a desire to not have a gastrostomy tube placed they are reluctant to proceed with surgery and have ordered  follow-up SLP evaluation regarding dysphagia.  As of [id number] son still conflicted over whether or not to place GT and will use the next few days to discuss with other family members who will help him come to a decision.  As of 10/10 patient continued to have leukocytosis in the 20,000 range.  Infectious disease work-up initiated.  Contrasted CT of pelvis revealed no abscess or other concerns around the area of the decubitus.  Repeat blood cultures pending.  Because of ongoing altered mental status over the past 48 hours with negative EEG CT of the head was obtained that revealed possible changes concerning for acute versus subacute stroke.  MRI showed similar findings.  Reviewed by stroke neurologist who was concerned that changes were more consistent with seizure etiology.  LTM EEG consistent with metabolic encephalopathy and stroke physiology   As of 10/12 son updated on above findings and worsening clinical status of his mother.  Plan is to no longer attempt to pursue feeding tube and instead focus on hospice care.  PMT will touch base with son regarding hospice options both inpatient and outpatient.   Subjective: Nursing documents if patient does except food liquids or medication in her mouth she is holding them and not swallowing them.  More often than not she will not open her mouth when attempts are made to feed or give medications.  Objective: Vitals:   08/05/21 1950 08/06/21 0432  BP: 138/73 (!) 122/55  Pulse: 100 81  Resp: 18 18  Temp: 98.1 F (36.7 C) 97.8 F (36.6 C)  SpO2: 100% 99%    Intake/Output Summary (Last 24 hours) at 08/06/2021 0758 Last data filed at 08/06/2021 0600 Gross per 24 hour  Intake 0 ml  Output 0 ml  Net 0 ml   Filed Weights   08/01/21 0528 08/01/21 0814 08/01/21 1220  Weight: 98.4 kg 68 kg 65 kg    Exam:  Constitutional: Opens eyes to loud voice and tactile stimulation but does not interact and is nonverbal Respiratory: Anterior lung sounds clear to  auscultation but diminished in the bases.  Remains on room air.  Pulse oximetry 98% Cardiovascular: S1-S2, continues with significant RUE dependent edema on stroke side, this is regular, currently normotensive Abdomen: LBM 10/11-still not eating, refusing all medications and liquids..  Abdomen soft nontender nondistended.   Neurologic: CN 2-12 grossly intact on visual inspection-at baseline-today and inability to follow simple commands unable to accurately assess strength, witnessed 1 episode of subtle facial twitching during my exam-bilateral upper extremity contractures worse on the left. Psychiatric: More awake than yesterday but interaction nonmeaningful and patient with significant dysarthria. DOES NOT HAVE CAPACITY.  Assessment/Plan: Acute problems: Sepsis physiology/RLL HCAP/Abn blood cxs c/w contaminant WBCs remain elevated today 25,000+ Not hypoxemic and MRSA PCR neg, initial blood cultures consistent with contaminant Completed 7 days of Maxipime for healthcare associated pneumonia Repeat blood cultures obtained 10/10 with no growth to date 10/10 CT pelvis without clinical signs of abscess or infection  Failure to thrive /Generalized deconditioning  /very poor oral intake/ Patient adamantly has refused G-tube placement.  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-recurrent acute on subacute stroke Over the weekend had symptoms concerning for possible seizure activity with negative EEG Continued with altered mentation and MRI revealed right posterior  frontal, posterior temporal, parietal and occipital acute versus subacute infarct LTM EEG without evidence of seizure activity Son updated and we are now pursuing hospice care-once PMT discussed with son and clarifies hospice disposition can transition to full comfort care and only administer medications and treatments that will provide comfort  Suspected dementia/recent delirium -Based on serial exams of this patient her mentation is more consistent with dementia than stroke related issues.  Surmise this lady had dementia that was undetected prior to admission and given recent stroke and change in environment the dementia has worsened -Continue olanzapine at at bedtime  ESRD on HD Plans to transition to comfort care/hospice care have informed nephrology that dialysis has been discontinued   Hypotension In context of dialysis and has been receiving midodrine   Type 2 diabetes mellitus Episodes of hypoglycemia Hemoglobin A1c 5.1.  Diet controlled at baseline but currently refusing all oral diet  Chronic thrombus left upper extremity Eliquis has been resumed today 10/12 before discussion with son Nonetheless patient refusing all medications  Stage II mid coccyx pressure injury, present on admission CT pelvis 10/10 without evidence of abscess    GERD Continue Protonix and Pepcid for now     Data Reviewed: Basic Metabolic Panel: Recent Labs  Lab 08/01/21 0348 08/03/21 0938 08/04/21 0356  NA 127* 127* 127*  K 3.6 3.4* 3.4*  CL 92* 94* 94*  CO2 '26 25 22  '$ GLUCOSE 129* 129* 66*  BUN '12 8 13  '$ CREATININE 3.16* 2.96* 3.47*  CALCIUM 8.6* 8.3* 8.0*  PHOS 2.5  --   --    Liver Function Tests: Recent Labs  Lab 08/01/21 0348  ALBUMIN 1.8*    Recent Labs  Lab 08/03/21 1022  AMMONIA 33   CBC: Recent Labs  Lab 07/31/21 0339 08/01/21 0348 08/03/21 0938 08/04/21 0356 08/05/21 0439  WBC 20.1* 18.2* 20.5* 20.8* 25.2*  NEUTROABS  --   --   --  16.5* 21.0*  HGB 7.5* 8.0*  7.1* 6.5* 9.6*  HCT 24.6* 26.7* 23.3* 21.1* 29.7*  MCV 91.1 91.4 90.0 88.7 87.1  PLT 188 203 194 174 180    CBG: Recent Labs  Lab 08/05/21 1611 08/05/21 2026 08/06/21 0224 08/06/21 0633 08/06/21 0704  GLUCAP 117* 112* 87 71 147*     Scheduled Meds:  apixaban  5 mg Oral BID   atorvastatin  40 mg Oral Daily   chlorhexidine  15 mL Mouth Rinse BID   Chlorhexidine Gluconate Cloth  6 each Topical Q0600   darbepoetin (ARANESP) injection - DIALYSIS  200 mcg Intravenous Q Fri-HD   feeding supplement  237 mL Oral BID BM   gabapentin  100 mg Oral Q12H   insulin aspart  0-6 Units Subcutaneous TID AC & HS   lactulose  20 g Oral Daily   mouth rinse  15 mL Mouth Rinse q12n4p   midodrine  10 mg Oral TID WC  mirtazapine  7.5 mg Oral QHS   multivitamin  1 tablet Oral QHS   OLANZapine zydis  2.5 mg Oral QHS   pantoprazole  40 mg Oral Daily   sodium chloride flush  10-40 mL Intracatheter Q12H   thiamine  100 mg Oral Daily   venlafaxine  37.5 mg Oral BID     Principal Problem:   GI bleed Active Problems:   Essential hypertension   Type 2 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, with long-term current use of insulin (HCC)   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   ESRD on dialysis ([place])   Dyslipidemia   Hypokalemia   Stage 2 skin ulcer of sacral region ([place])   Cerebral thrombosis with cerebral infarction   Protein-calorie malnutrition, severe   Acute GI bleeding   Oropharyngeal dysphagia   Failure to thrive in adult   Inadequate oral intake   Clogged feeding tube   Fever   Rectal bleeding   Delirium due to another medical condition   Neurogenic dysphagia   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

## 2021-08-06 NOTE — Plan of Care (Signed)
  Problem: Safety: Goal: Ability to remain free from injury will improve Outcome: Completed/Met

## 2021-08-06 NOTE — Progress Notes (Addendum)
[facility] Progress Note   Subjective: No new events. Sleeping, rouses briefly to voice.   Objective Vitals:   08/05/21 1614 08/05/21 1950 08/06/21 0432 08/06/21 0913  BP: 117/75 138/73 (!) 122/55 130/62  Pulse: 95 100 81 (!) 18  Resp: '19 18 18 18  '$ Temp: 98.2 F (36.8 C) 98.1 F (36.7 C) 97.8 F (36.6 C) 98.2 F (36.8 C)  TempSrc:    Oral  SpO2: 99% 100% 99% 98%  Weight:      Height:       Physical Exam General: Chronically ill appearing female in NAD Heart: [id number] RRR 2/6 Systolic M Lungs: CTAB  Abdomen:soft, NABS Extremities:  + UE edema, no LE edema Dialysis Access: [facility] drsg intact   Additional Objective Labs: Basic Metabolic Panel: Recent Labs  Lab 08/01/21 0348 08/03/21 0938 08/04/21 0356  NA 127* 127* 127*  K 3.6 3.4* 3.4*  CL 92* 94* 94*  CO2 '26 25 22  '$ GLUCOSE 129* 129* 66*  BUN '12 8 13  '$ CREATININE 3.16* 2.96* 3.47*  CALCIUM 8.6* 8.3* 8.0*  PHOS 2.5  --   --     Liver Function Tests: Recent Labs  Lab 08/01/21 0348  ALBUMIN 1.8*    No results for input(s): LIPASE, AMYLASE in the last 168 hours. CBC: Recent Labs  Lab 07/31/21 0339 08/01/21 0348 08/03/21 0938 08/04/21 0356 08/05/21 0439  WBC 20.1* 18.2* 20.5* 20.8* 25.2*  NEUTROABS  --   --   --  16.5* 21.0*  HGB 7.5* 8.0* 7.1* 6.5* 9.6*  HCT 24.6* 26.7* 23.3* 21.1* 29.7*  MCV 91.1 91.4 90.0 88.7 87.1  PLT 188 203 194 174 180    Blood Culture    Component Value Date/Time   SDES BLOOD RIGHT HAND 08/04/2021 0848   SPECREQUEST  08/04/2021 0848    BOTTLES DRAWN AEROBIC AND ANAEROBIC Blood Culture adequate volume   CULT  08/04/2021 0848    NO GROWTH 2 DAYS Performed at [facility], [place] [address], [place], [place] [address]    REPTSTATUS PENDING 08/04/2021 0848    Cardiac Enzymes: No results for input(s): CKTOTAL, CKMB, CKMBINDEX, TROPONINI in the last 168 hours. CBG: Recent Labs  Lab 08/05/21 1611 08/05/21 2026 08/06/21 0224 08/06/21 0633  08/06/21 0704  GLUCAP 117* 112* 87 71 147*    Iron Studies: No results for input(s): IRON, TIBC, TRANSFERRIN, FERRITIN in the last 72 hours. '@lablastinr3'$ @ Studies/Results: CT HEAD WO CONTRAST (5MM)  Result Date: 08/04/2021 CLINICAL DATA:  Mental status change EXAM: CT HEAD WITHOUT CONTRAST TECHNIQUE: Contiguous axial images were obtained from the base of the skull through the vertex without intravenous contrast. COMPARISON:  05/25/2021 CT head, correlation is also made with 05/27/2021 MRI brain FINDINGS: Brain: Area of increased hypodensity in the right posterior frontal and anterior parietal lobe (series 4, image 13), which may reflect sequela of prior infarct but could reflect acute or subacute infarct. Additional areas of increased hypodensity in the right thalamus and frontal, parietal, posterior temporal, and lateral occipital lobes are likely the expected evolution of infarct noted on 712022 and 05/27/2021. Periventricular white matter changes, likely the sequela of chronic small vessel ischemic disease. No acute hemorrhage, hydrocephalus, extra-axial collection, mass, mass effect, or midline shift. Vascular: No hyperdense vessel or unexpected calcification. Skull: Normal. Negative for fracture or focal lesion. Sinuses/Orbits: Mucosal thickening in the right sphenoid sinus. Status post bilateral lens replacements. Other: Fluid in the right-greater-than-left mastoid air cells. IMPRESSION: 1. Area of hypodensity in the right  posterior frontal and anterior parietal lobe, which does not specifically correspond to an area of infarct on the prior exams and may reflect additional acute or subacute infarct. An MRI is recommended for further evaluation. 2. Additional areas of increased hypodensity in the right cerebral hemisphere in areas of prior infarct from 05/25/2021, likely expected evolution of prior infarct. No acute hemorrhage. These results were called by telephone at the time of interpretation on  08/04/2021 at 12:04 pm to provider Erin Hearing , who verbally acknowledged these results. Electronically Signed   By: Merilyn Baba M.D.   On: 08/04/2021 12:05   CT PELVIS W CONTRAST  Result Date: 08/04/2021 CLINICAL DATA:  Unexplained leukocytosis. Sacral decubitus ulcer. Evaluate for abscess. EXAM: CT PELVIS WITH CONTRAST TECHNIQUE: Multidetector CT imaging of the pelvis was performed using the standard protocol following the bolus administration of intravenous contrast. CONTRAST:  113m OMNIPAQUE IOHEXOL 300 MG/ML  SOLN COMPARISON:  None. FINDINGS: Urinary Tract:  No abnormality visualized. Bowel: Unremarkable visualized pelvic bowel loops. No evidence of perirectal/perianal abscess. Vascular/Lymphatic: No pathologically enlarged lymph nodes. No significant vascular abnormality seen. Reproductive:  Uterus and adnexa unremarkable.  No mass. Other: No free fluid or free air. Diffuse edema throughout the subcutaneous soft tissues of the pelvis and upper thighs compatible with anasarca. Musculoskeletal: No visible sacral decubitus ulcer noted. No soft tissue abscess or acute bony abnormality. No evidence of osteomyelitis. IMPRESSION: No visible abscess. Anasarca like edema throughout the subcutaneous soft tissues. Electronically Signed   By: KRolm BaptiseM.D.   On: 08/04/2021 12:14   MR BRAIN WO CONTRAST  Result Date: 08/04/2021 CLINICAL DATA:  Stroke suspected EXAM: MRI HEAD WITHOUT CONTRAST TECHNIQUE: Multiplanar, multiecho pulse sequences of the brain and surrounding structures were obtained without intravenous contrast. COMPARISON:  MRI 05/27/2021, correlation is also made with CT head 08/04/2021 FINDINGS: Brain: Cortical restricted diffusion with ADC correlate in the right posterior frontal lobe, right parietal lobe, posterior right temporal lobe, and right occipital lobe (series 3, images 36). Some of these areas are in or closely associated with areas of remote infarct. Some of these areas are  associated with T2 signal. No acute or chronic hemorrhage. Redemonstrated areas of encephalomalacia in the right frontal, temporal, parietal, and occipital lobes. Ex vacuo dilatation right lateral ventricle. No hydrocephalus. No extra-axial collection. Vascular: Normal flow voids. Skull and upper cervical spine: Normal marrow signal. Sinuses/Orbits: Negative.  Status post bilateral lens replacements. Other: Fluid throughout the bilateral mastoid air cells. IMPRESSION: Cortical restricted diffusion in the right posterior frontal, posterior temporal, parietal, and occipital lobes, concerning for acute and/or subacute infarcts. These results were called by telephone at the time of interpretation on 08/04/2021 at 3:27 pm to provider [facility], who verbally acknowledged these results. Electronically Signed   By: AMerilyn BabaM.D.   On: 08/04/2021 15:39   Overnight EEG with video  Result Date: 08/05/2021 YLora Havens MD     08/05/2021  9:38 AM Patient Name: Amy WIGETMRN: [id number] Attending: PLora HavensReferring Physician/Provider: KClance Boll NP Duration: 08/04/2021 2132 to 08/05/2021 0945  Patient history: 75year old female with prior strokes with altered mental status and twitching on left side of face.  EEG to evaluate for seizures.  Level of alertness: awake  AEDs during EEG study: GBP  Technical aspects: This EEG study was done with scalp electrodes positioned according to the 10-20 International system of electrode placement. Electrical activity was acquired at a sampling rate of '500Hz'$  and reviewed  with a high frequency filter of '70Hz'$  and a low frequency filter of '1Hz'$ . EEG data were recorded continuously and digitally stored.  Description: This EEG study was done with scalp electrodes positioned according to the 10-20 International system of electrode placement. Electrical activity was acquired at a sampling rate of '500Hz'$  and reviewed with a high frequency filter of  '70Hz'$  and a low frequency filter of '1Hz'$ . EEG data were recorded continuously and digitally stored.  Description: EEG showed continuous generalized and lateralized right hemisphere 3-'5hz'$  theta-delta slowing. Generalized periodic discharges with triphasic morphology at 0.5-1.5 Hz were also noted, more prominent when awake/stimulated. Hyperventilation and photic stimulation were not performed.    ABNORMALITY -Continuous slow, generalized and lateralized right hemisphere -Periodic discharges with triphasic morphology, Generalized  IMPRESSION: This study showed generalized periodic discharges with triphasic morphology at 0.5-1.5 Hz which can be on the ictal-interictal continuum but given the morphology, frequency and reactivity to stimulation, is more likely suggestive of toxic-metabolic causes. There is also cortical dysfunction arising from right hemisphere likely secondary to underlying stroke. Additionally there is moderate diffuse encephalopathy, nonspecific etiology but could be secondary to toxic-metabolic causes. No seizures were seen throughout the recording.  Priyanka Barbra Sarks   Medications:    apixaban  5 mg Oral BID   atorvastatin  40 mg Oral Daily   chlorhexidine  15 mL Mouth Rinse BID   Chlorhexidine Gluconate Cloth  6 each Topical Q0600   darbepoetin (ARANESP) injection - DIALYSIS  200 mcg Intravenous Q Fri-HD   feeding supplement  237 mL Oral BID BM   gabapentin  100 mg Oral Q12H   insulin aspart  0-6 Units Subcutaneous TID AC & HS   lactulose  20 g Oral Daily   mouth rinse  15 mL Mouth Rinse q12n4p   midodrine  10 mg Oral TID WC   mirtazapine  7.5 mg Oral QHS   multivitamin  1 tablet Oral QHS   OLANZapine zydis  2.5 mg Oral QHS   pantoprazole  40 mg Oral Daily   sodium chloride flush  10-40 mL Intracatheter Q12H   thiamine  100 mg Oral Daily   venlafaxine  37.5 mg Oral BID     Dialysis Orders: AF MWF  3h 61mn 400/500 62.5kg 3K/2.5 Ca P2 AVG HeRO   Hep none - Mircera 50 q 2 wks   (7/13)  Venofer 50 q wk - Parsabiv '5mg'$  [facility]   Assessment/Plan: AMS/FTT: Mental status that waxes/wanes.  HD recliner consistently for past 3 weeks - tentatively accepted back to outpatient HD unit . Which dialysis unit she goes to is dependent on which dialysis is closest to SNF to which she is discharged (Clarified with Dr. GMoshe Cipro10/02/2021) She needs SNF. Melena/Rectal bleeding - GI consulted. Hx rectal ulcers.  EGD 7/27 normal, flex sig with mucosal ulceration c/w sterocoral ulcers, non bleeding. Hgb 8s---dropped to 6.5 on 10/10. S/p prbcs on 10/10.  Leukocytosis -- On IV abx - per primary. Blood cultures +S. Epidermidis. Likely contaminant. Repeat cultures pending  ESRD: Next HD 10/13. May keep on TTS schedule depending on inpatient dialysis census.  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 AVG clotted 06/27/21, unable to be declotted d/t arm contracture. S/P conversion to tunneled HD catheter 07/02/21.  Hypertension/volume: BP variable, on midodrine '10mg'$  TID. More edema recently, UF as tolerated. Adding holding parameters for midodrine not to give is SBP over  140. Anemia of ESRD:  continue Aranesp 212mg q Fri. 1 unit prbcs on 08/04/21. Hgb improved.  MBD: CorrCa slightly high, Phos low. No binder or VDRA needed Nutrition: Alb very low. CoreTrac removed, continue to encourage full meals and PO supplements.  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 ?New CVA/seizure activity -per primary  Psych eval from 9/14 determined that patient has functional mental capacity for medical decision-making. Psych re-consulted 9/23 to evaluate the patient again - at this point, patient does not have the capacity to make medical decisions. Ongoing discussions with patient's son continues. Dispo: DNR/DNI. Poor prognosis. Although patient is currently tolerating being in the chair for HD and we do not believe  dialysis is adding to her quality of life but that is for her to decide. Would like to get her out of the hospital.  OLynnda ChildPA-C [facility] 08/06/2021,10:02 AM   I have personally seen and examined this patient and agree with the assessment/plan as outlined below by GLarina Earthly PA-C. Just informed that patient is to be transitioning to hospice, no more dialysis treatments. Discussed with team. Will sign off from a nephrology perspective. Please call with any questions/concerns. Thank you. Janene Yousuf,MD 08/06/2021 11:04 AM

## 2021-08-06 NOTE — TOC Progression Note (Addendum)
Transition of Care [facility]) - Progression Note    Patient Details  Name: Amy [name] MRN: [id number] Date of Birth: [birth year]  Transition [facility] CM/SW Hart, RN Phone Number: 08/06/2021, 10:21 AM  Clinical Narrative:    Case management spoke with Lorie Apley, CM [employer] [phone]) and she was following up for assistance with discharge planning and SNF placement needs.  Erin Hearing, NP is aware and states that she continues to have palliative care discussions with the patient's son on the phone and has been unable to speak with him today.  CM and MSW will continue to follow the patient for TOC needs.  08/06/2021 1059-  CM spoke with Erin Hearing, NP and the patient's son will be speaking with Palliative care after 3 pm today for family discussion regarding hospice services. CM called and left a message with Christlyn Edison Pace, RNCM with [facility] to have her follow after palliative care discussion today for probable need for Inpatient hospice after Palliative Care NP speaks with the son this afternoon after 3 pm on the phone.   Expected Discharge Plan:  (To be determined.) Barriers to Discharge: Continued Medical Work up  Expected Discharge Plan and Services Expected Discharge Plan:  (To be determined.) In-house Referral: Clinical Social Work     Living arrangements for the past 2 months: [place] (Patient came to hospital from SNF [facility])                                       Social Determinants of Health (SDOH) Interventions    Readmission Risk Interventions Readmission Risk Prevention Plan 08/04/2021  Transportation Screening Complete  Medication Review [employer]) Complete  PCP or Specialist appointment within 3-5 days of discharge Complete  HRI or Home Care Consult Complete  SW Recovery Care/Counseling Consult Complete  Palliative Care Screening Complete  Skilled Nursing  Facility Complete  Some recent data might be hidden

## 2021-08-06 NOTE — Progress Notes (Addendum)
Manufacturing engineer [facility]) Hospital Liaison note.     Received request from [facility] for evaluation for [facility]. Currently waiting on confirmation from PMT to go forward with referral.   A Please do not hesitate to call with questions.    Thank you,   Farrel Gordon, RN, [facility] Liaison  [phone]

## 2021-08-06 NOTE — Progress Notes (Signed)
Hypoglycemic Event  CBG: 71  Treatment: D50 25 mL (12.5 gm)  Symptoms: None  Follow-up CBG: Time   CBG Result: Results for Amy [name], Amy [name] (MRN [id number]) as of 08/06/2021 07:20  Ref. Range 08/06/2021 07:04  Glucose-Capillary Latest Ref Range: 70 - 99 mg/dL 147 (H)   Possible Reasons for Event: Inadequate meal intake  Comments/MD notified:    Viviano Simas

## 2021-08-07 DIAGNOSIS — Z515 Encounter for palliative care: Secondary | ICD-10-CM | POA: Diagnosis not present

## 2021-08-07 DIAGNOSIS — Z992 Dependence on renal dialysis: Secondary | ICD-10-CM | POA: Diagnosis not present

## 2021-08-07 DIAGNOSIS — N186 End stage renal disease: Secondary | ICD-10-CM | POA: Diagnosis not present

## 2021-08-07 DIAGNOSIS — R627 Adult failure to thrive: Secondary | ICD-10-CM | POA: Diagnosis not present

## 2021-08-07 DIAGNOSIS — I639 Cerebral infarction, unspecified: Secondary | ICD-10-CM | POA: Diagnosis not present

## 2021-08-07 LAB — GLUCOSE, CAPILLARY
Glucose-Capillary: 83 mg/dL (ref 70–99)
Glucose-Capillary: 85 mg/dL (ref 70–99)
Glucose-Capillary: 82 mg/dL (ref 70–99)
Glucose-Capillary: 114 mg/dL — ABNORMAL HIGH (ref 70–99)
Glucose-Capillary: 132 mg/dL — ABNORMAL HIGH (ref 70–99)

## 2021-08-07 NOTE — Progress Notes (Signed)
Patient ID: [name], female   DOB: [birth year], 75 y.o.   MRN: [id number]    Progress Note from the Palliative Medicine Team at [facility]   Patient Name: Amy [name]        Date: 08/07/2021 DOB: [birth year]  Age: 75 y.o. MRN#: [id number] Attending Physician: Shawna Clamp, MD Primary Care Physician: [name], MD Admit Date: 05/19/2021   Medical records reviewed, discussed with treatment team regarding reconsult on [name]  75 y.o. 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  Path: Polypoid fragment of colorectal mucosa with crypt architectural disorder including slight hyperplastic features, mild fibromuscular hyperplasia of the lamina propria, and adherent necroinflammatory debris suggestive of ulcer exudate. 05/21/21 Flex sig: clean based, non-bleeding distal rectl ulcers (stercoral ulcer).  Blood clots and solid stool limited visualization.   05/21/21 EGD: Normal.    Long complicated hospitalization.  Patient continued to decline physically, functionally and cognitively within the scope of full medical support. Patient has been seen multiple times by palliative medicine and palliative medicine team has spoken with son Amy [name] on multiple occasions.  According to attending team family has made decision to shift to a full comfort path foregoing life-prolonging measures and is interested in residential hospice facility for end-of-life care.   I spoke to patient's son Amy [name] by telephone, he confirms that comfort is the priority however at this time when questioned on his request for [facility] he tells me that he is not sure what facility he would like to explore.  He tells me that he is coming into the hospital today to visit,I encouraged him to call me if he is at the bedside.  At this time I have not  heard from him (1630)  Discussed with Erin Hearing NP  Total time spent on the unit was 25 minutes  Greater than 50% of the time was spent in counseling and coordination of care  Wadie Lessen NP  Palliative Medicine Team Team Phone # [phone] Pager [phone]

## 2021-08-07 NOTE — Progress Notes (Signed)
[facility] PROGRESS NOTE  Amy [name] [id number] DOB: [birth year] DOA: 05/19/2021 PCP: Amy Maw, MD  Status:  Remains inpatient appropriate because:Unsafe d/c plan, IV treatments appropriate due to intensity of illness or inability to take PO, and Inpatient level of care appropriate due to severity of illness  Dispo: The patient is from: SNF              Anticipated d/c is to:  Residential hospice              Patient currently is medically stable to d/c.     Difficult to place patient Yes              Barriers to discharge: Son and his wife are visiting various residential hospitals in the area before they make a decision    Level of care: Med-Surg  Code Status: DNR Family communication: Spoke with patient's son by telephone on 10/12.  Awaiting PMT discussion with son. DVT prophylaxis: Eliquis COVID vaccination status: Amy [name] 02/26/2020, Pfizer 04/10/2021    HPI: 75 y.o. 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 Patient presented to the ED on 7/25 with dark stool.  On presentation, hemoglobin was 9.6; GI was consulted.  She underwent EGD which was normal and flexible sigmoidoscopy which showed nonbleeding distal rectal ulcers/stercoral ulcers.  Subsequently, GI signed off.  Neurology was also consulted for altered mental status; MRI brain was similar to prior MRI at [facility] with a large right MCA stroke.  EEG this admission unremarkable. Nephrology has been following for dialysis needs. PEG tube was desired by family but could not be placed because of overlying transverse colon.  General surgery was consulted for G-tube placement but patient does not want a  G-tube and  was deemed to have capacity by psych team.  Subsequently after further more detailed evaluations and observation it was finally clarified that she lacked capacity.  Surgical team was consulted the fact that the patient lacks underlying dysphagia and even though she lacks capacity continues to express a desire to not have a gastrostomy tube placed they are reluctant to proceed with surgery and have ordered follow-up SLP evaluation regarding dysphagia.  As of [id number] son still conflicted over whether or not to place GT and will use the next few days to discuss with other family members who will help him come to a decision.  As of 10/10 patient continued to have leukocytosis in the 20,000 range.  Infectious disease work-up initiated.  Contrasted CT of pelvis revealed no abscess or other concerns around the area of the decubitus.  Repeat blood cultures pending.  Because of ongoing altered mental status over the past 48 hours with negative EEG CT of the head was obtained that revealed possible changes concerning for acute versus subacute stroke.  MRI showed similar findings.  Reviewed by stroke neurologist who was concerned that changes were more consistent with seizure etiology.  LTM EEG consistent with metabolic encephalopathy and stroke physiology   As of 10/12 son updated on above findings and worsening clinical status of his mother.  Plan is to no longer attempt to pursue feeding tube and instead focus on hospice care.  Son is visiting local hospice facilities for making a decision about disposition.  Subjective: Patient essentially unresponsive.  She will open her eyes to voice and tactile stimulation but does not track and does not attempt to speak.  Objective: Vitals:   08/06/21 2040 08/07/21 0502  BP: (!) 155/86 (!) 152/81  Pulse: 99 (!) 102  Resp: 18 18  Temp: 97.9 F (36.6 C) 98.3 F (36.8 C)  SpO2: 100% 100%    Intake/Output Summary (Last 24 hours) at 08/07/2021 0804 Last  data filed at 08/06/2021 1700 Gross per 24 hour  Intake 0 ml  Output 0 ml  Net 0 ml   Filed Weights   08/01/21 0814 08/01/21 1220 08/06/21 2040  Weight: 68 kg 65 kg 96.2 kg    Exam:  Constitutional: Opens eyes briefly, nonverbal Respiratory: Room air, lungs are clear but diminished in the bases, pulse oximetry normal, no increased work of breathing Cardiovascular: Normal heart sounds, normotensive, stable bilateral UEs edema R>L Abdomen: LBM 10/12-still not eating, refusing all medications and liquids..  Abdomen soft nontender nondistended.  Bowel sounds are present Neurologic: CN 2-12 grossly intact on visual inspection-sensation remains intact, no spontaneous movement and continues with bilateral upper extremity contractures Psychiatric: Opens eyes but remains nonverbal  Assessment/Plan: Acute problems: Sepsis physiology/RLL HCAP/Abn blood cxs c/w contaminant Completed 7 days of Maxipime for healthcare associated pneumonia Repeat blood cultures unremarkable Focus now will be on comfort  Failure to thrive with generalized deconditioning in context of neurogenic stage Given acute stroke patient is no longer a candidate for open G-tube placement.  Her son agrees focus will now be on comfort.  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  Suspected dementia/recent delirium -Based on serial exams of this patient her mentation is more consistent with dementia than stroke related issues.  Surmise this lady had dementia that was undetected prior to admission and given recent stroke and change in environment the dementia has worsened  ESRD on HD Plans to transition to comfort care/hospice care have informed nephrology that dialysis has been discontinued  Type 2 diabetes mellitus Episodes of hypoglycemia Hemoglobin A1c 5.1.  Diet controlled at baseline but currently refusing all oral diet-focus at this time will be on comfort feeds  Chronic thrombus left upper extremity Eliquis discontinued with focus on comfort only  Stage II mid coccyx pressure injury, present on admission CT pelvis 10/10 without evidence of abscess Continue wound care as a method of comfort    GERD [facility] focus now on comfort     Data Reviewed: Basic Metabolic Panel: Recent Labs  Lab 08/01/21 0348 08/03/21 0938 08/04/21 0356  NA 127* 127* 127*  K 3.6 3.4* 3.4*  CL 92* 94* 94*  CO2 '26 25 22  '$ GLUCOSE 129* 129* 66*  BUN '12 8 13  '$ CREATININE 3.16* 2.96* 3.47*  CALCIUM 8.6* 8.3* 8.0*  PHOS 2.5  --   --    Liver Function Tests: Recent Labs  Lab 08/01/21 0348  ALBUMIN 1.8*    Recent Labs  Lab 08/03/21 1022  AMMONIA 33   CBC: Recent Labs  Lab 08/01/21 0348 08/03/21 0938 08/04/21 0356 08/05/21 0439  WBC 18.2* 20.5* 20.8* 25.2*  NEUTROABS  --   --  16.5* 21.0*  HGB 8.0* 7.1* 6.5* 9.6*  HCT 26.7* 23.3* 21.1* 29.7*  MCV 91.4 90.0 88.7 87.1  PLT 203 194 174 180    CBG: Recent Labs  Lab 08/06/21 1624 08/06/21 2128 08/06/21 2258 08/07/21 0441 08/07/21 0650  GLUCAP 74 61* 76 83 85     Scheduled Meds:  apixaban  5 mg Oral BID   atorvastatin  40 mg Oral Daily   chlorhexidine  15 mL Mouth Rinse BID   Chlorhexidine Gluconate Cloth  6 each Topical Q0600   darbepoetin (ARANESP) injection - DIALYSIS  200 mcg  Intravenous Q Fri-HD   feeding supplement  237 mL Oral BID BM   gabapentin  100 mg Oral Q12H   insulin aspart  0-6 Units Subcutaneous TID AC & HS   lactulose  20 g Oral Daily   mouth rinse  15 mL Mouth Rinse q12n4p   midodrine  10 mg Oral TID WC   mirtazapine  7.5 mg Oral QHS   multivitamin  1 tablet Oral QHS   OLANZapine zydis  2.5 mg Oral QHS   pantoprazole  40 mg Oral Daily   sodium chloride flush  10-40 mL Intracatheter Q12H   thiamine  100 mg Oral Daily   venlafaxine  37.5 mg Oral BID     Principal Problem:   GI bleed Active Problems:   Essential hypertension   Type 2 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, with long-term current use of insulin (HCC)   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   ESRD on dialysis (HCC)   Dyslipidemia   Hypokalemia   Stage 2 skin ulcer of sacral region ([place])   Cerebral thrombosis with cerebral infarction   Protein-calorie malnutrition, severe   Acute GI bleeding   Oropharyngeal dysphagia   Failure to thrive in adult   Inadequate oral intake   Clogged feeding tube   Fever   Rectal bleeding   Delirium due to another medical condition   Neurogenic dysphagia   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 (HCC)   Leukocytosis   Abnormal chest x-ray   HCAP (healthcare-associated pneumonia)   Sepsis due to other etiology [facility])   Acute encephalopathy   Recurrent cerebrovascular accidents (CVAs) [facility])   Consultants: Vascular surgery Nephrology Gastroenterology Neurology Palliative medicine General surgery  Procedures: EEG Echocardiogram Cortrack EGD  Antibiotics: Ceftriaxone 8/3 through 8/5   Time spent: 25 minutes    Erin Hearing ANP  [facility] 7 am - 330 pm/M-F for direct patient care and secure chat Please refer to Amion for contact info 79  days

## 2021-08-08 DIAGNOSIS — I639 Cerebral infarction, unspecified: Secondary | ICD-10-CM | POA: Diagnosis not present

## 2021-08-08 DIAGNOSIS — Z515 Encounter for palliative care: Secondary | ICD-10-CM | POA: Diagnosis not present

## 2021-08-08 LAB — GLUCOSE, CAPILLARY: Glucose-Capillary: 131 mg/dL — ABNORMAL HIGH (ref 70–99)

## 2021-08-08 MED ORDER — BIOTENE DRY MOUTH MT LIQD: 15.0000 mL | OROMUCOSAL | Status: DC | PRN

## 2021-08-08 MED ORDER — MORPHINE SULFATE (CONCENTRATE) 10 MG/0.5ML PO SOLN
5.0000 mg | ORAL | Status: DC | PRN
Start: 2021-08-08 — End: 2021-08-13
  Filled 2021-08-08: qty 0.5

## 2021-08-08 MED ORDER — HALOPERIDOL LACTATE 5 MG/ML IJ SOLN: 0.5000 mg | INTRAMUSCULAR | Status: DC | PRN

## 2021-08-08 MED ORDER — LORAZEPAM 2 MG/ML PO CONC: 1.0000 mg | ORAL | Status: DC | PRN

## 2021-08-08 MED ORDER — LORAZEPAM 1 MG PO TABS
1.0000 mg | ORAL_TABLET | ORAL | Status: DC | PRN
  Administered 2021-08-08: 1 mg via ORAL
  Filled 2021-08-08: qty 1

## 2021-08-08 MED ORDER — GLYCOPYRROLATE 0.2 MG/ML IJ SOLN: 0.2000 mg | INTRAMUSCULAR | Status: DC | PRN

## 2021-08-08 MED ORDER — LORAZEPAM 2 MG/ML IJ SOLN: 1.0000 mg | INTRAMUSCULAR | Status: DC | PRN

## 2021-08-08 MED ORDER — GLYCOPYRROLATE 1 MG PO TABS
1.0000 mg | ORAL_TABLET | ORAL | Status: DC | PRN
  Filled 2021-08-08: qty 1

## 2021-08-08 MED ORDER — HALOPERIDOL LACTATE 2 MG/ML PO CONC
0.5000 mg | ORAL | Status: DC | PRN
  Filled 2021-08-08: qty 0.3

## 2021-08-08 MED ORDER — POLYVINYL ALCOHOL 1.4 % OP SOLN
1.0000 [drp] | Freq: Four times a day (QID) | OPHTHALMIC | Status: DC | PRN
  Filled 2021-08-08: qty 15

## 2021-08-08 MED ORDER — HALOPERIDOL 0.5 MG PO TABS
0.5000 mg | ORAL_TABLET | ORAL | Status: DC | PRN
  Filled 2021-08-08: qty 1

## 2021-08-08 MED ORDER — ONDANSETRON 4 MG PO TBDP
4.0000 mg | ORAL_TABLET | Freq: Four times a day (QID) | ORAL | Status: DC | PRN
  Administered 2021-08-26: 4 mg via ORAL
  Filled 2021-08-08: qty 1

## 2021-08-08 MED ORDER — MORPHINE SULFATE (CONCENTRATE) 10 MG/0.5ML PO SOLN
5.0000 mg | ORAL | Status: DC | PRN
  Administered 2021-08-12 – 2021-08-13 (×3): 5 mg via ORAL
  Filled 2021-08-08 (×2): qty 0.5

## 2021-08-08 MED ORDER — ONDANSETRON HCL 4 MG/2ML IJ SOLN: 4.0000 mg | Freq: Four times a day (QID) | INTRAMUSCULAR | Status: DC | PRN

## 2021-08-08 NOTE — Progress Notes (Signed)
Nutrition Brief Note  Chart reviewed. Pt now transitioning to comfort care.  No further nutrition interventions planned at this time.  Please re-consult as needed.   Eartha Vonbehren, MS, RD, LDN (she/her/hers) RD pager number and weekend/on-call pager number located in Amion.   

## 2021-08-08 NOTE — Progress Notes (Signed)
[facility] PROGRESS NOTE  Amy [name] [id number] DOB: [birth year] DOA: 05/19/2021 PCP: [name], MD  Status:  Remains inpatient appropriate because:Unsafe d/c plan, IV treatments appropriate due to intensity of illness or inability to take PO, and Inpatient level of care appropriate due to severity of illness  Dispo: The patient is from: SNF              Anticipated d/c is to:  Residential hospice              Patient currently is medically stable to d/c.     Difficult to place patient Yes              Barriers to discharge: Son and his wife are visiting various residential hospitals in the area before they make a decision  COMFORT CARE  Level of care: Med-Surg  Code Status: DNR Family communication: Spoke with patient's son by telephone on 10/12.  Awaiting PMT discussion with son. DVT prophylaxis: Eliquis COVID vaccination status: [name] 02/26/2020, Pfizer 04/10/2021    HPI: 75 y.o. 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 Patient presented to the ED on 7/25 with dark stool.  On presentation, hemoglobin was 9.6; GI was consulted.  She underwent EGD which was normal and flexible sigmoidoscopy which showed nonbleeding distal rectal ulcers/stercoral ulcers.  Subsequently, GI signed off.  Neurology was also consulted for altered mental status; MRI brain was similar to prior MRI at [facility] with a large right MCA stroke.  EEG this admission unremarkable. Nephrology has been following for dialysis needs. PEG tube was desired by family but could not be placed because of overlying transverse colon.  General surgery was consulted for G-tube placement but patient  does not want a G-tube and  was deemed to have capacity by psych team.  Subsequently after further more detailed evaluations and observation it was finally clarified that she lacked capacity.  Surgical team was consulted the fact that the patient lacks underlying dysphagia and even though she lacks capacity continues to express a desire to not have a gastrostomy tube placed they are reluctant to proceed with surgery and have ordered follow-up SLP evaluation regarding dysphagia.  As of [id number] son still conflicted over whether or not to place GT and will use the next few days to discuss with other family members who will help him come to a decision.  As of 10/10 patient continued to have leukocytosis in the 20,000 range.  Infectious disease work-up initiated.  Contrasted CT of pelvis revealed no abscess or other concerns around the area of the decubitus.  Repeat blood cultures pending.  Because of ongoing altered mental status over the past 48 hours with negative EEG CT of the head was obtained that revealed possible changes concerning for acute versus subacute stroke.  MRI showed similar findings.  Reviewed by stroke neurologist who was concerned that changes were more consistent with seizure etiology.  LTM EEG consistent with metabolic encephalopathy and stroke physiology  As of 10/12 son updated on above findings and worsening clinical status of his mother.  Plan is to no longer attempt to pursue feeding tube and instead focus on hospice care.  Son is visiting local hospice facilities for making a decision about disposition.  Subjective: Briefly awakened with unintelligible speech.  Quickly went back to sleep.  Objective: Vitals:   08/07/21 2059 08/08/21 0506  BP: (!) 159/81 (!) 152/76  Pulse: (!) 110 (!) 110  Resp: 17 17  Temp: 98.4 F (36.9 C) 98.4 F (36.9 C)  SpO2: 96% 97%    Intake/Output Summary (Last 24 hours) at 08/08/2021 0825 Last data filed at 08/08/2021 [id number] Gross per 24 hour   Intake 0 ml  Output 0 ml  Net 0 ml   Filed Weights   08/01/21 0814 08/01/21 1220 08/06/21 2040  Weight: 68 kg 65 kg 96.2 kg    Exam:  Constitutional: Opens eyes briefly, unintelligible speech Respiratory: Room air, lungs are clear but diminished in the bases, pulse oximetry normal, no increased work of breathing Cardiovascular: S1-S2, normotensive, stable bilateral UE edema R>L Abdomen: LBM 10/12-still not eating, refusing all medications and liquids..  Abdomen soft nontender nondistended.  Bowel sounds are present Neurologic: CN 2-12 grossly intact on visual inspection-sensation remains intact, no spontaneous movement and continues with bilateral upper extremity contractures Psychiatric: Opens eyes fully, does not track.  Speech remains unintelligible  Assessment/Plan: Acute problems: Sepsis physiology/RLL HCAP/Abn blood cxs c/w contaminant Completed 7 days of Maxipime for healthcare associated pneumonia Repeat blood cultures unremarkable Focus now will be on comfort  Failure to thrive with generalized deconditioning in context of neurogenic dysphagia Given acute stroke patient is no longer a candidate for open G-tube placement.  Her son agrees focus will now be on comfort.  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  Suspected dementia/recent delirium -Based on serial exams of this patient her mentation is more consistent with dementia than stroke related issues.  Surmise this lady had dementia that was undetected prior to admission and given recent stroke and change in environment the dementia has worsened  ESRD on HD Plans to transition to comfort care/hospice care have informed nephrology that dialysis has been discontinued  Type 2 diabetes mellitus Episodes of hypoglycemia Hemoglobin A1c 5.1.  Diet controlled at baseline but currently refusing all oral diet-focus at this time will be on comfort feeds  Chronic thrombus left upper extremity Eliquis discontinued with focus on comfort only  Stage II mid coccyx pressure injury, present on admission CT pelvis 10/10 without evidence of abscess Continue wound care as a method of comfort    GERD Amy [name] focus now on comfort     Data Reviewed: Basic Metabolic Panel: Recent Labs  Lab 08/03/21 0938 08/04/21 0356  NA 127* 127*  K 3.4* 3.4*  CL 94* 94*  CO2 25 22  GLUCOSE 129* 66*  BUN 8 13  CREATININE 2.96* 3.47*  CALCIUM 8.3* 8.0*    Recent Labs  Lab 08/03/21 1022  AMMONIA 33   CBC: Recent Labs  Lab 08/03/21 0938 08/04/21 0356 08/05/21 0439  WBC 20.5* 20.8* 25.2*  NEUTROABS  --  16.5* 21.0*  HGB  7.1* 6.5* 9.6*  HCT 23.3* 21.1* 29.7*  MCV 90.0 88.7 87.1  PLT 194 174 180    CBG: Recent Labs  Lab 08/07/21 0650 08/07/21 1107 08/07/21 1632 08/07/21 2101 08/08/21 0641  GLUCAP 85 82 114* 132* 131*     Scheduled Meds:  Chlorhexidine Gluconate Cloth  6 each Topical Q0600   feeding supplement  237 mL Oral BID BM   gabapentin  100 mg Oral Q12H   mirtazapine  7.5 mg Oral QHS   OLANZapine zydis  2.5 mg Oral QHS   sodium chloride flush  10-40 mL Intracatheter Q12H   venlafaxine  37.5 mg Oral BID     Principal Problem:   GI bleed Active Problems:   Essential hypertension   Type 2 diabetes mellitus with  chronic [name] disease on chronic dialysis, with long-term current use of insulin (HCC)   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   ESRD on dialysis ([place])   Dyslipidemia   Hypokalemia   Stage 2 skin ulcer of sacral region ([place])   Cerebral thrombosis with cerebral infarction   Protein-calorie malnutrition, severe   Acute GI bleeding   Oropharyngeal dysphagia   Failure to thrive in adult   Inadequate oral intake   Clogged feeding tube   Fever   Rectal bleeding   Delirium due to another medical condition   Neurogenic dysphagia   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

## 2021-08-08 NOTE — TOC Progression Note (Signed)
Transition of Care [facility]) - Progression Note    Patient Details  Name: Amy [name] MRN: [id number] Date of Birth: 03/07/1946  Transition of Care [facility]) CM/SW [place], RN Phone Number: 08/08/2021, 10:40 AM  Clinical Narrative:    CM and MSW with DTP Team will continue to follow the patient for Inpatient Hospice Placement.  The patient is currently under comfort care measures and palliative care is waiting to hear back from the patient's son regarding choice for Inpatient Hospice facility.  CM called and spoke with Amy, CM at [facility] regarding patient's family plan for disposition pending choice regarding inpatient hospice facility placement.   Expected Discharge Plan:  (To be determined.) Barriers to Discharge: Continued Medical Work up  Expected Discharge Plan and Services Expected Discharge Plan:  (To be determined.) In-house Referral: Clinical Social Work     Living arrangements for the past 2 months: [place] (Patient came to hospital from SNF [facility])                                       Social Determinants of Health (SDOH) Interventions    Readmission Risk Interventions Readmission Risk Prevention Plan 08/04/2021  Transportation Screening Complete  Medication Review [employer]) Complete  PCP or Specialist appointment within 3-5 days of discharge Complete  HRI or Home Care Consult Complete  SW Recovery Care/Counseling Consult Complete  Palliative Care Screening Complete  Skilled Nursing Facility Complete  Some recent data might be hidden

## 2021-08-09 DIAGNOSIS — K922 Gastrointestinal hemorrhage, unspecified: Secondary | ICD-10-CM | POA: Diagnosis not present

## 2021-08-09 LAB — GLUCOSE, CAPILLARY: Glucose-Capillary: 168 mg/dL — ABNORMAL HIGH (ref 70–99)

## 2021-08-09 LAB — CULTURE, BLOOD (ROUTINE X 2)
Culture: NO GROWTH
Culture: NO GROWTH
Special Requests: ADEQUATE
Special Requests: ADEQUATE

## 2021-08-09 NOTE — Progress Notes (Signed)
PROGRESS NOTE    Amy [name]  [id number] DOB: 04/19/1946 DOA: 05/19/2021 PCP: [name], MD   Brief Narrative:  This 75 y.o. 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   She underwent EGD which was normal and flexible sigmoidoscopy which showed nonbleeding distal rectal ulcers/stercoral ulcers.  Subsequently, GI signed off. Patient hospital course was complicated by multiple medical issues.  Altered mentation , poor p.o. intake.  As of 10/22 due to worsening clinical status of his mother,  Amy [name] has decided to focus on hospice care instead of pursuing feeding tube. Son is visiting local hospice facility for making a decision about disposition.  Assessment & Plan:   Principal Problem:   GI bleed Active Problems:   Essential hypertension   Type 2 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, with long-term current use of insulin (HCC)   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   ESRD on dialysis ([place])   Dyslipidemia   Hypokalemia   Stage 2 skin ulcer of sacral region ([place])   Cerebral thrombosis with cerebral infarction   Protein-calorie malnutrition, severe   Acute GI bleeding   Oropharyngeal dysphagia   Failure to thrive in adult   Inadequate oral intake   Clogged feeding tube   Fever   Rectal bleeding   Delirium due to another medical condition   Neurogenic dysphagia   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 Completed 7 days of Maxipime for healthcare associated  pneumonia. Repeat blood cultures unremarkable Focus now will be on comfort   Failure to thrive with generalized deconditioning in context of neurogenic dysphagia Given acute stroke patient is no longer a candidate for open G-tube placement.   Her son agrees focus will now be on comfort.  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   Suspected dementia/recent delirium Based on serial exams of this patient, her mentation is more consistent with dementia than stroke related issues. this lady had dementia that was undetected prior to admission and given recent stroke and change in environment the dementia has worsened   ESRD on HD Plans to transition to comfort care/hospice care have informed nephrology that dialysis has been discontinued   Type 2 diabetes mellitus Episodes of hypoglycemia Hemoglobin A1c 5.1.  Diet controlled at baseline but currently refusing all oral diet-focus at this time will be on comfort feeds   Chronic thrombus left upper extremity Eliquis discontinued with focus  on comfort only   Stage II mid coccyx pressure injury, present on admission CT pelvis 10/10 without evidence of abscess Continue wound care as a method of comfort    GERD focus now on comfort     DVT prophylaxis: SCDs Code Status: DNR/comfort  care Family Communication: No family at bedside Disposition Plan:   Status is: Inpatient  Remains inpatient appropriate because: Awaiting residential hospice. Anticipated discharge to residential hospice in 1 to 2 days    Consultants:  GI Nephrology Palliative care Neurology,  general surgery,  vascular surgery  Procedures: Echo, EEG, EGD, core track Antimicrobials:  Anti-infectives (From admission, onward)    Start     Dose/Rate Route Frequency Ordered Stop   07/30/21 1200  vancomycin (VANCOREADY) IVPB 750 mg/150 mL  Status:  Discontinued        750 mg 150 mL/hr over 60 Minutes Intravenous Every M-W-F (Hemodialysis) 07/29/21 1348 08/01/21 1529   07/29/21 1600  ceFEPIme (MAXIPIME) 1 g in sodium chloride 0.9 % 100 mL IVPB        1 g 200 mL/hr over 30 Minutes Intravenous Every 24 hours 07/29/21 1348 08/04/21 2115   07/29/21 1445  vancomycin (VANCOREADY) IVPB 750 mg/150 mL  Status:  Discontinued        750 mg 150 mL/hr over 60 Minutes Intravenous  Once 07/29/21 1348 07/29/21 1353   07/29/21 1445  vancomycin (VANCOREADY) IVPB 1750 mg/350 mL        1,750 mg 175 mL/hr over 120 Minutes Intravenous  Once 07/29/21 1353 07/29/21 1925   07/02/21 1116  ceFAZolin (ANCEF) IVPB 2g/100 mL premix        over 30 Minutes Intravenous Continuous PRN 07/02/21 1139 07/02/21 1116   07/02/21 1111  ceFAZolin (ANCEF) 2-4 GM/100ML-% IVPB       Note to Pharmacy: Domenick Bookbinder   : cabinet override      07/02/21 1111 07/02/21 2314   07/01/21 1215  ceFAZolin (ANCEF) IVPB 2g/100 mL premix        2 g 200 mL/hr over 30 Minutes Intravenous On call 07/01/21 0902 07/02/21 1215   05/29/21 1622  ceFAZolin (ANCEF) 2-4 GM/100ML-% IVPB       Note to Pharmacy: Lytle Butte   : cabinet override      05/29/21 1622 05/30/21 0429   05/29/21 0000  cefTRIAXone (ROCEPHIN) 1 g in sodium chloride 0.9 % 100 mL IVPB  Status:  Discontinued        1 g 200 mL/hr over 30 Minutes Intravenous Every 24 hours 05/28/21 1801  05/28/21 1801   05/28/21 1830  cefTRIAXone (ROCEPHIN) 1 g in sodium chloride 0.9 % 100 mL IVPB        1 g 200 mL/hr over 30 Minutes Intravenous Every 24 hours 05/28/21 1801 05/30/21 1907   05/28/21 1630  ceFAZolin (ANCEF) IVPB 2g/100 mL premix        2 g 200 mL/hr over 30 Minutes Intravenous To Radiology 05/28/21 1537 05/29/21 1630        Subjective: Patient was seen and examined at bedside.  Overnight events noted.   Patient responds on calling her name but then goes back to sleep. Patient is awaiting bed in residential hospice.  Objective: Vitals:   08/07/21 1632 08/07/21 2059 08/08/21 0506 08/09/21 0422  BP: (!) 147/72 (!) 159/81 (!) 152/76 (!) 194/94  Pulse: 100 (!) 110 (!) 110 (!) 111  Resp: '18 17 17 20  '$ Temp: 98.4 F (36.9 C) 98.4 F (36.9 C) 98.4  F (36.9 C) 98.1 F (36.7 C)  TempSrc:    Oral  SpO2: 100% 96% 97% 100%  Weight:      Height:        Intake/Output Summary (Last 24 hours) at 08/09/2021 1408 Last data filed at 08/09/2021 0700 Gross per 24 hour  Intake 594 ml  Output 0 ml  Net 594 ml   Filed Weights   08/01/21 0814 08/01/21 1220 08/06/21 2040  Weight: 68 kg 65 kg 96.2 kg    Examination:  General exam: Appears comfortable, chronically ill looking, deconditioned. Respiratory system: Clear to auscultation bilaterally, respiratory effort normal RR 15 Cardiovascular system: S1-S2 heard, regular rate and rhythm, no murmur.   Gastrointestinal system: Abdominal soft, nontender, nondistended, BS + Central nervous system: Alert and oriented. No focal neurological deficits. Extremities: Both upper extremities edema more on the right. Skin: No rashes, lesions or ulcers Psychiatry: J opens eyes fully but does not conversant    Data Reviewed: I have personally reviewed following labs and imaging studies  CBC: Recent Labs  Lab 08/03/21 0938 08/04/21 0356 08/05/21 0439  WBC 20.5* 20.8* 25.2*  NEUTROABS  --  16.5* 21.0*  HGB 7.1* 6.5* 9.6*  HCT  23.3* 21.1* 29.7*  MCV 90.0 88.7 87.1  PLT 194 174 [id number]   Basic Metabolic Panel: Recent Labs  Lab 08/03/21 0938 08/04/21 0356  NA 127* 127*  K 3.4* 3.4*  CL 94* 94*  CO2 25 22  GLUCOSE 129* 66*  BUN 8 13  CREATININE 2.96* 3.47*  CALCIUM 8.3* 8.0*   GFR: Estimated Creatinine Clearance: 17.2 mL/min (A) (by C-G formula based on SCr of 3.47 mg/dL (H)). Liver Function Tests: No results for input(s): AST, ALT, ALKPHOS, BILITOT, PROT, ALBUMIN in the last 168 hours. No results for input(s): LIPASE, AMYLASE in the last 168 hours. Recent Labs  Lab 08/03/21 1022  AMMONIA 33   Coagulation Profile: No results for input(s): INR, PROTIME in the last 168 hours. Cardiac Enzymes: No results for input(s): CKTOTAL, CKMB, CKMBINDEX, TROPONINI in the last 168 hours. BNP (last 3 results) No results for input(s): PROBNP in the last 8760 hours. HbA1C: No results for input(s): HGBA1C in the last 72 hours. CBG: Recent Labs  Lab 08/07/21 0650 08/07/21 1107 08/07/21 1632 08/07/21 2101 08/08/21 0641  GLUCAP 85 82 114* 132* 131*   Lipid Profile: No results for input(s): CHOL, HDL, LDLCALC, TRIG, CHOLHDL, LDLDIRECT in the last 72 hours. Thyroid Function Tests: No results for input(s): TSH, T4TOTAL, FREET4, T3FREE, THYROIDAB in the last 72 hours. Anemia Panel: No results for input(s): VITAMINB12, FOLATE, FERRITIN, TIBC, IRON, RETICCTPCT in the last 72 hours. Sepsis Labs: No results for input(s): PROCALCITON, LATICACIDVEN in the last 168 hours.  Recent Results (from the past 240 hour(s))  Culture, blood (routine x 2)     Status: None   Collection Time: 08/04/21  8:47 AM   Specimen: BLOOD RIGHT HAND  Result Value Ref Range Status   Specimen Description BLOOD RIGHT HAND  Final   Special Requests   Final    BOTTLES DRAWN AEROBIC AND ANAEROBIC Blood Culture adequate volume   Culture   Final    NO GROWTH 5 DAYS Performed at [facility], [address] [place], [place] [address]     Report Status 08/09/2021 FINAL  Final  Culture, blood (routine x 2)     Status: None   Collection Time: 08/04/21  8:48 AM   Specimen: BLOOD RIGHT HAND  Result Value Ref  Range Status   Specimen Description BLOOD RIGHT HAND  Final   Special Requests   Final    BOTTLES DRAWN AEROBIC AND ANAEROBIC Blood Culture adequate volume   Culture   Final    NO GROWTH 5 DAYS Performed at [facility], [address] [place], [place] [address]    Report Status 08/09/2021 FINAL  Final    Radiology Studies: No results found.  Scheduled Meds:  Chlorhexidine Gluconate Cloth  6 each Topical Q0600   feeding supplement  237 mL Oral BID BM   gabapentin  100 mg Oral Q12H   mirtazapine  7.5 mg Oral QHS   OLANZapine zydis  2.5 mg Oral QHS   sodium chloride flush  10-40 mL Intracatheter Q12H   venlafaxine  37.5 mg Oral BID   Continuous Infusions:   LOS: 81 days    Time spent: 25 mins    Amy Bonanno, MD [facility]   If 7PM-7AM, please contact night-coverage

## 2021-08-09 NOTE — Plan of Care (Signed)
  Problem: Education: Goal: Knowledge of the prescribed therapeutic regimen will improve Outcome: Progressing   

## 2021-08-10 DIAGNOSIS — K922 Gastrointestinal hemorrhage, unspecified: Secondary | ICD-10-CM | POA: Diagnosis not present

## 2021-08-10 NOTE — Progress Notes (Signed)
PROGRESS NOTE    GEORGE SWENEY  [id number] DOB: [birth year] DOA: 05/19/2021 PCP: [name], MD   Brief Narrative:  This 75 y.o. 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   She underwent EGD which was normal and flexible sigmoidoscopy which showed nonbleeding distal rectal ulcers/stercoral ulcers.  Subsequently, GI signed off. Patient hospital course was complicated by multiple medical issues.  Altered mentation , poor p.o. intake.  As of 10/22 due to worsening clinical status of his mother,  Pandora Leiter has decided to focus on hospice care instead of pursuing feeding tube. Son is visiting local hospice facilities for making a decision about disposition.  Assessment & Plan:   Principal Problem:   GI bleed Active Problems:   Essential hypertension   Type 2 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, with long-term current use of insulin (HCC)   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   ESRD on dialysis ([place])   Dyslipidemia   Hypokalemia   Stage 2 skin ulcer of sacral region ([place])   Cerebral thrombosis with cerebral infarction   Protein-calorie malnutrition, severe   Acute GI bleeding   Oropharyngeal dysphagia   Failure to thrive in adult   Inadequate oral intake   Clogged feeding tube   Fever   Rectal bleeding   Delirium due to another medical condition   Neurogenic dysphagia   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 (HCC)   Leukocytosis   Abnormal chest x-ray   HCAP (healthcare-associated pneumonia)   Sepsis due to other etiology ([place])   Acute encephalopathy   Recurrent cerebrovascular accidents (CVAs) ([place])   End of life care   Sepsis /  RLL HCAP/Abn blood cxs c/w contaminant. Completed 7 days of Maxipime for healthcare associated  pneumonia. Repeat blood cultures unremarkable Focus now will be on comfort care.   Failure to thrive with generalized deconditioning in context of neurogenic dysphagia Given acute stroke patient is no longer a candidate for open G-tube placement.   Her son agrees focus now be on comfort.  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   Suspected dementia/recent delirium Based on serial exams of this patient, her mentation is more consistent with dementia than stroke related issues. this lady had dementia that was undetected prior to admission and given recent stroke and change in environment the dementia has worsened   ESRD on HD Plans to transition to comfort care/hospice care have informed nephrology that dialysis has been discontinued   Type 2 diabetes mellitus Episodes of hypoglycemia Hemoglobin A1c 5.1.  Diet controlled at baseline but currently refusing all oral diet-focus at this time will be on comfort feeds   Chronic thrombus left upper extremity Eliquis discontinued with focus  on comfort only   Stage II mid coccyx pressure injury, present on admission CT pelvis 10/10 without evidence of abscess Continue wound care as a method of comfort    GERD focus now on comfort     DVT prophylaxis: SCDs Code Status: DNR/comfort  care Family Communication: No family at bedside Disposition Plan:   Status is: Inpatient  Remains inpatient appropriate because: Awaiting residential hospice. Anticipated discharge to residential hospice in 1 to 2 days    Consultants:  GI Nephrology Palliative care Neurology,  general surgery,  vascular surgery  Procedures: Echo, EEG, EGD, core track Antimicrobials:  Anti-infectives (From admission, onward)    Start     Dose/Rate Route Frequency Ordered Stop   07/30/21 1200  vancomycin (VANCOREADY) IVPB 750 mg/150 mL  Status:  Discontinued        750 mg 150 mL/hr over 60 Minutes Intravenous Every M-W-F (Hemodialysis) 07/29/21 1348 08/01/21 1529   07/29/21 1600  ceFEPIme (MAXIPIME) 1 g in sodium chloride 0.9 % 100 mL IVPB        1 g 200 mL/hr over 30 Minutes Intravenous Every 24 hours 07/29/21 1348 08/04/21 2115   07/29/21 1445  vancomycin (VANCOREADY) IVPB 750 mg/150 mL  Status:  Discontinued        750 mg 150 mL/hr over 60 Minutes Intravenous  Once 07/29/21 1348 07/29/21 1353   07/29/21 1445  vancomycin (VANCOREADY) IVPB 1750 mg/350 mL        1,750 mg 175 mL/hr over 120 Minutes Intravenous  Once 07/29/21 1353 07/29/21 1925   07/02/21 1116  ceFAZolin (ANCEF) IVPB 2g/100 mL premix        over 30 Minutes Intravenous Continuous PRN 07/02/21 1139 07/02/21 1116   07/02/21 1111  ceFAZolin (ANCEF) 2-4 GM/100ML-% IVPB       Note to Pharmacy: Domenick Bookbinder   : cabinet override      07/02/21 1111 07/02/21 2314   07/01/21 1215  ceFAZolin (ANCEF) IVPB 2g/100 mL premix        2 g 200 mL/hr over 30 Minutes Intravenous On call 07/01/21 0902 07/02/21 1215   05/29/21 1622  ceFAZolin (ANCEF) 2-4 GM/100ML-% IVPB       Note to Pharmacy: Lytle Butte   : cabinet override      05/29/21 1622 05/30/21 0429   05/29/21 0000  cefTRIAXone (ROCEPHIN) 1 g in sodium chloride 0.9 % 100 mL IVPB  Status:  Discontinued        1 g 200 mL/hr over 30 Minutes Intravenous Every 24 hours 05/28/21 1801  05/28/21 1801   05/28/21 1830  cefTRIAXone (ROCEPHIN) 1 g in sodium chloride 0.9 % 100 mL IVPB        1 g 200 mL/hr over 30 Minutes Intravenous Every 24 hours 05/28/21 1801 05/30/21 1907   05/28/21 1630  ceFAZolin (ANCEF) IVPB 2g/100 mL premix        2 g 200 mL/hr over 30 Minutes Intravenous To Radiology 05/28/21 1537 05/29/21 1630        Subjective: Patient was seen and examined at bedside.  Overnight events noted.   Patient is alert, awake, responding appropriately. Patient is awaiting bed in residential hospice.  Objective: Vitals:   08/09/21 0422 08/09/21 1638 08/10/21 0525 08/10/21 0817  BP: (!) 194/94 (!) 182/96 (!) 163/91 136/74  Pulse: (!) 111 89 (!) 102 100  Resp: '20 16 19 18  '$ Temp: 98.1 F (36.7 C) (!) 97.4 F (36.3 C) (!) 97.5 F (36.4 C) (!) 97.4 F (  36.3 C)  TempSrc: Oral Oral Oral Oral  SpO2: 100% 100% 96% 100%  Weight:      Height:        Intake/Output Summary (Last 24 hours) at 08/10/2021 1143 Last data filed at 08/10/2021 0600 Gross per 24 hour  Intake 0 ml  Output 0 ml  Net 0 ml   Filed Weights   08/01/21 0814 08/01/21 1220 08/06/21 2040  Weight: 68 kg 65 kg 96.2 kg    Examination:  General exam: Appears comfortable, chronically ill looking, deconditioned, NAD Respiratory system: Clear to auscultation bilaterally, respiratory effort normal RR 15 Cardiovascular system: S1-S2 heard, regular rate and rhythm, no murmur.   Gastrointestinal system: Abdominal soft, nontender, nondistended, BS + Central nervous system: Alert and oriented x2. No focal neurological deficits. Extremities: Both upper extremities edema more on the right. Skin: No rashes, lesions or ulcers Psychiatry: Following commands.   Data Reviewed: I have personally reviewed following labs and imaging studies  CBC: Recent Labs  Lab 08/04/21 0356 08/05/21 0439  WBC 20.8* 25.2*  NEUTROABS 16.5* 21.0*  HGB 6.5* 9.6*  HCT 21.1* 29.7*  MCV 88.7 87.1  PLT 174 99991111   Basic  Metabolic Panel: Recent Labs  Lab 08/04/21 0356  NA 127*  K 3.4*  CL 94*  CO2 22  GLUCOSE 66*  BUN 13  CREATININE 3.47*  CALCIUM 8.0*   GFR: Estimated Creatinine Clearance: 17.2 mL/min (A) (by C-G formula based on SCr of 3.47 mg/dL (H)). Liver Function Tests: No results for input(s): AST, ALT, ALKPHOS, BILITOT, PROT, ALBUMIN in the last 168 hours. No results for input(s): LIPASE, AMYLASE in the last 168 hours. No results for input(s): AMMONIA in the last 168 hours.  Coagulation Profile: No results for input(s): INR, PROTIME in the last 168 hours. Cardiac Enzymes: No results for input(s): CKTOTAL, CKMB, CKMBINDEX, TROPONINI in the last 168 hours. BNP (last 3 results) No results for input(s): PROBNP in the last 8760 hours. HbA1C: No results for input(s): HGBA1C in the last 72 hours. CBG: Recent Labs  Lab 08/07/21 1107 08/07/21 1632 08/07/21 2101 08/08/21 0641 08/09/21 1635  GLUCAP 82 114* 132* 131* 168*   Lipid Profile: No results for input(s): CHOL, HDL, LDLCALC, TRIG, CHOLHDL, LDLDIRECT in the last 72 hours. Thyroid Function Tests: No results for input(s): TSH, T4TOTAL, FREET4, T3FREE, THYROIDAB in the last 72 hours. Anemia Panel: No results for input(s): VITAMINB12, FOLATE, FERRITIN, TIBC, IRON, RETICCTPCT in the last 72 hours. Sepsis Labs: No results for input(s): PROCALCITON, LATICACIDVEN in the last 168 hours.  Recent Results (from the past 240 hour(s))  Culture, blood (routine x 2)     Status: None   Collection Time: 08/04/21  8:47 AM   Specimen: BLOOD RIGHT HAND  Result Value Ref Range Status   Specimen Description BLOOD RIGHT HAND  Final   Special Requests   Final    BOTTLES DRAWN AEROBIC AND ANAEROBIC Blood Culture adequate volume   Culture   Final    NO GROWTH 5 DAYS Performed at [facility], [address] [place], [place] [address]    Report Status 08/09/2021 FINAL  Final  Culture, blood (routine x 2)     Status: None   Collection Time:  08/04/21  8:48 AM   Specimen: BLOOD RIGHT HAND  Result Value Ref Range Status   Specimen Description BLOOD RIGHT HAND  Final   Special Requests   Final    BOTTLES DRAWN AEROBIC AND ANAEROBIC Blood Culture adequate volume  Culture   Final    NO GROWTH 5 DAYS Performed at [facility], [place] [address] [place], [place] [address]    Report Status 08/09/2021 FINAL  Final    Radiology Studies: No results found.  Scheduled Meds:  Chlorhexidine Gluconate Cloth  6 each Topical Q0600   feeding supplement  237 mL Oral BID BM   gabapentin  100 mg Oral Q12H   mirtazapine  7.5 mg Oral QHS   OLANZapine zydis  2.5 mg Oral QHS   sodium chloride flush  10-40 mL Intracatheter Q12H   venlafaxine  37.5 mg Oral BID   Continuous Infusions:   LOS: 82 days    Time spent: 25 mins    Winda Summerall, MD [facility]   If 7PM-7AM, please contact night-coverage

## 2021-08-11 DIAGNOSIS — Z515 Encounter for palliative care: Secondary | ICD-10-CM | POA: Diagnosis not present

## 2021-08-11 NOTE — Progress Notes (Signed)

## 2021-08-11 NOTE — Progress Notes (Signed)
[facility] PROGRESS NOTE  Amy [name] [id number] DOB: 1946-05-11 DOA: 05/19/2021 PCP: Amy Maw, MD  Status:  Remains inpatient appropriate because:Unsafe d/c plan, IV treatments appropriate due to intensity of illness or inability to take PO, and Inpatient level of care appropriate due to severity of illness  Dispo: The patient is from: SNF              Anticipated d/c is to:  Residential hospice              Patient currently is medically stable to d/c.     Difficult to place patient Yes              Barriers to discharge: Son and his wife are visiting various residential hospice facility in the area before they make a decision peor his previous discussion with PMT provider Amy [name].  On 10/17 Amy [name] case manager called to speak with the son to determine if they had made any decisions-he still remained unclear regarding actual places to visit.  Amy [name] offered to have hospice liaison called him with options and he agreed.  COMFORT CARE  Level of care: Med-Surg  Code Status: DNR Family communication: Spoke with patient's son by telephone on 10/12.  Awaiting PMT discussion with son. DVT prophylaxis: Eliquis COVID vaccination status: Amy [name] 02/26/2020, Pfizer 04/10/2021    HPI: 75 y.o. 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 Patient presented to the ED on 7/25 with dark stool.  On presentation, hemoglobin was 9.6; GI was consulted.  She underwent EGD which was normal and flexible sigmoidoscopy which showed nonbleeding distal rectal ulcers/stercoral ulcers.  Subsequently, GI signed off.  Neurology was also consulted for altered mental status;  MRI brain was similar to prior MRI at [facility] with a large right MCA stroke.  EEG this admission unremarkable. Nephrology has been following for dialysis needs. PEG tube was desired by family but could not be placed because of overlying transverse colon.  General surgery was consulted for G-tube placement but patient does not want a G-tube and  was deemed to have capacity by psych team.  Subsequently after further more detailed evaluations and observation it was finally clarified that she lacked capacity.  Surgical team was consulted the fact that the patient lacks underlying dysphagia and even though she lacks capacity continues to express a desire to not have a gastrostomy tube placed they are reluctant to proceed with surgery and have ordered follow-up SLP evaluation regarding dysphagia.  As of [id number] son still conflicted over whether or not to place GT and will use the next few days to discuss with other family members who will help him come to a decision.  10/10 Due to acute AMS CT of the head was obtained that revealed possible changes concerning for acute versus subacute stroke.  MRI showed similar findings.  Reviewed by stroke neurologist who was concerned that changes were more consistent with seizure etiology.  LTM EEG consistent with metabolic encephalopathy and stroke physiology  As of 10/12 son updated on above findings and worsening clinical status of his mother.  Plan is to no longer attempt to pursue feeding tube and instead focus on hospice care.  Son is visiting local hospice facilities for making a  decision about disposition.  On 10/17 hospice liaison to speak with son since he is having difficulty with decision making regarding residential hospice   Subjective: Opens eyes but is nonverbal and does not track.  Objective: Vitals:   08/10/21 0817 08/10/21 2030  BP: 136/74 (!) 133/97  Pulse: 100 (!) 152  Resp: 18 18  Temp: (!) 97.4 F (36.3 C)   SpO2: 100% 95%    Intake/Output Summary  (Last 24 hours) at 08/11/2021 0810 Last data filed at 08/11/2021 0700 Gross per 24 hour  Intake 0 ml  Output 0 ml  Net 0 ml   Filed Weights   08/01/21 0814 08/01/21 1220 08/06/21 2040  Weight: 68 kg 65 kg 96.2 kg    Exam:  Constitutional: Opens eyes briefly, no attempts at verbalization Respiratory: Room air, lungs are clear but diminished in the bases, pulse oximetry normal, normal respiratory effort Cardiovascular: S1-S2, normotensive, stable bilateral UE edema R>L Abdomen: LBM 10/12-still not eating, refusing all medications and liquids..  Abdomen soft nontender nondistended.  Bowel sounds + Neurologic: CN 2-12 grossly intact on visual inspection-sensation remains intact, no spontaneous movement and continues with bilateral upper extremity hypertonicity Psychiatric: Opens eyes fully, does not track.  Nonverbal  Assessment/Plan: Acute problems: Sepsis physiology/RLL HCAP/Abn blood cxs c/w contaminant Completed 7 days of Maxipime  Focus now will be on comfort  Failure to thrive with generalized deconditioning in context of neurogenic dysphagia Given acute stroke patient is no longer a candidate for open G-tube placement.  Her son agrees -focus will now be on comfort.  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  Suspected dementia/recent delirium See  above regarding comfort measures  ESRD on HD Has transitioned to comfort care/hospice care have informed nephrology that dialysis has been discontinued  Type 2 diabetes mellitus Episodes of hypoglycemia Hemoglobin A1c 5.1.  Diet controlled at baseline but currently refusing all oral diet-focus at this time will be on comfort feeds  Chronic thrombus left upper extremity Eliquis discontinued with focus on comfort only  Stage II mid coccyx pressure injury, present on admission CT pelvis 10/10 without evidence of abscess-focus now on comfort care        Data Reviewed:  CBC: Recent Labs  Lab 08/05/21 0439  WBC 25.2*  NEUTROABS 21.0*  HGB 9.6*  HCT 29.7*  MCV 87.1  PLT 180    CBG: Recent Labs  Lab 08/07/21 1107 08/07/21 1632 08/07/21 2101 08/08/21 0641 08/09/21 1635  GLUCAP 82 114* 132* 131* 168*     Scheduled Meds:  Chlorhexidine Gluconate Cloth  6 each Topical Q0600   feeding supplement  237 mL Oral BID BM   gabapentin  100 mg Oral Q12H   mirtazapine  7.5 mg Oral QHS   OLANZapine zydis  2.5 mg Oral QHS   sodium chloride flush  10-40 mL Intracatheter Q12H   venlafaxine  37.5 mg Oral BID     Principal Problem:  GI bleed Active Problems:   Essential hypertension   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, with long-term current use of insulin (HCC)   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   ESRD on dialysis ([place])   Dyslipidemia   Hypokalemia   Stage 2 skin ulcer of sacral region [facility]   Cerebral thrombosis with cerebral infarction   Protein-calorie malnutrition, severe   Acute GI bleeding   Oropharyngeal dysphagia   Failure to thrive in adult   Inadequate oral intake   Clogged feeding tube   Fever   Rectal bleeding   Delirium due to another medical condition   Neurogenic dysphagia   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 (HCC)   Leukocytosis   Abnormal  chest x-ray   HCAP (healthcare-associated pneumonia)   Sepsis due to other etiology [facility])   Acute encephalopathy   Recurrent cerebrovascular accidents (CVAs) ([place])   End of life care   Consultants: Vascular surgery Nephrology Gastroenterology Neurology Palliative medicine General surgery  Procedures: EEG Echocardiogram Cortrack EGD  Antibiotics: Ceftriaxone 8/3 through 8/5   Time spent: 25 minutes    Erin Hearing ANP  [facility] 7 am - 330 pm/M-F for direct patient care and secure chat Please refer to Amion for contact info 83  days

## 2021-08-11 NOTE — TOC Progression Note (Addendum)
Transition of Care [facility]) - Progression Note    Patient Details  Name: Amy [name] MRN: [id number] Date of Birth: Jul 24, 1946  Transition of Care [facility]) CM/SW Contact  Curlene Labrum, RN Phone Number: 08/11/2021, 8:12 AM  Clinical Narrative:    Case management called and left a voicemail message with the patient's son, Rever Bellizzi, requesting a return telephone call for family decision made for Inpatient Hospice choice.  CM will follow up with the family regarding this matter.  08/11/2021 1200 - I called and spoke with the patient's son, [name] on the phone and asked about Inpatient Hospice choice and the patient's son states that he has been unable to visit a residential hospice facility.  I explained to the son that [facility] was in [place] area and he was agreeable to speaking with [facility] regarding possible placement.  I called the spoke with Christlyn Edison Pace, [facility] and she plans to reach out to the patient's son regarding placement.  [facility] does not have available beds today and son will be made aware.  CM and MSW with DTP Team will continue to follow the patient for Residential Hospice Placement.   Expected Discharge Plan:  (To be determined.) Barriers to Discharge: Continued Medical Work up  Expected Discharge Plan and Services Expected Discharge Plan:  (To be determined.) In-house Referral: Clinical Social Work     Living arrangements for the past 2 months: [name] (Patient came to hospital from SNF [facility])                                       Social Determinants of Health (SDOH) Interventions    Readmission Risk Interventions Readmission Risk Prevention Plan 08/04/2021  Transportation Screening Complete  Medication Review Press photographer) Complete  PCP or Specialist appointment within 3-5 days of discharge Complete  HRI or Home Care Consult Complete  SW Recovery Care/Counseling Consult Complete  Palliative Care  Screening Complete  Skilled Nursing Facility Complete  Some recent data might be hidden

## 2021-08-12 DIAGNOSIS — Z515 Encounter for palliative care: Secondary | ICD-10-CM | POA: Diagnosis not present

## 2021-08-12 MED ORDER — IPRATROPIUM-ALBUTEROL 0.5-2.5 (3) MG/3ML IN SOLN: 3.0000 mL | Freq: Four times a day (QID) | RESPIRATORY_TRACT | Status: AC | PRN

## 2021-08-12 MED ORDER — ONDANSETRON HCL 4 MG/2ML IJ SOLN: 4.0000 mg | Freq: Four times a day (QID) | INTRAMUSCULAR | 0 refills | Status: AC | PRN

## 2021-08-12 MED ORDER — MORPHINE SULFATE (CONCENTRATE) 10 MG/0.5ML PO SOLN: 5.0000 mg | ORAL | 0 refills | Status: DC | PRN

## 2021-08-12 MED ORDER — GLYCOPYRROLATE 0.2 MG/ML IJ SOLN: 0.2000 mg | INTRAMUSCULAR | Status: AC | PRN

## 2021-08-12 MED ORDER — VENLAFAXINE HCL 37.5 MG PO TABS: 37.5000 mg | ORAL_TABLET | Freq: Two times a day (BID) | ORAL | Status: DC

## 2021-08-12 MED ORDER — HALOPERIDOL 0.5 MG PO TABS: 0.5000 mg | ORAL_TABLET | ORAL | Status: DC | PRN

## 2021-08-12 MED ORDER — HALOPERIDOL LACTATE 5 MG/ML IJ SOLN: 0.5000 mg | INTRAMUSCULAR | Status: DC | PRN

## 2021-08-12 MED ORDER — LORAZEPAM 1 MG PO TABS: 1.0000 mg | ORAL_TABLET | ORAL | 0 refills | Status: DC | PRN

## 2021-08-12 MED ORDER — GABAPENTIN 250 MG/5ML PO SOLN: 100.0000 mg | Freq: Two times a day (BID) | ORAL | 12 refills | Status: AC

## 2021-08-12 MED ORDER — HALOPERIDOL LACTATE 2 MG/ML PO CONC: 0.5000 mg | ORAL | 0 refills | Status: DC | PRN

## 2021-08-12 MED ORDER — ACETAMINOPHEN 160 MG/5ML PO SOLN: 650.0000 mg | Freq: Four times a day (QID) | ORAL | 0 refills | Status: AC | PRN

## 2021-08-12 MED ORDER — ONDANSETRON 4 MG PO TBDP: 4.0000 mg | ORAL_TABLET | Freq: Four times a day (QID) | ORAL | 0 refills | Status: AC | PRN

## 2021-08-12 MED ORDER — BIOTENE DRY MOUTH MT LIQD: 15.0000 mL | OROMUCOSAL | Status: AC | PRN

## 2021-08-12 MED ORDER — POLYVINYL ALCOHOL 1.4 % OP SOLN: 1.0000 [drp] | Freq: Four times a day (QID) | OPHTHALMIC | 0 refills | Status: AC | PRN

## 2021-08-12 MED ORDER — LORAZEPAM 2 MG/ML IJ SOLN
1.0000 mg | INTRAMUSCULAR | 0 refills | Status: DC | PRN
Start: 2021-08-12 — End: 2021-09-09

## 2021-08-12 MED ORDER — OLANZAPINE 5 MG PO TBDP: 2.5000 mg | ORAL_TABLET | Freq: Every day | ORAL | Status: AC

## 2021-08-12 MED ORDER — GLYCOPYRROLATE 1 MG PO TABS: 1.0000 mg | ORAL_TABLET | ORAL | Status: AC | PRN

## 2021-08-12 NOTE — TOC Progression Note (Signed)
Transition of Care [facility]) - Progression Note    Patient Details  Name: Amy [name] MRN: [id number] Date of Birth: 1946/03/25  Transition of Care [facility]) CM/SW [place], RN Phone Number: 08/12/2021, 2:03 PM  Clinical Narrative:    Case management spoke with Amy [name], RNCM with [facility] on the phone and [facility] has an available bed for the patient.  Amy [name], RNCM was unable to reach the patient's son by phone today to discuss care and transition to their facility.  I called and spoke with the patient's son, Amy [name], on the phone and explained that the patient was approved for admission to the hospice facility and that Amy [name], [facility] with [facility] will be reaching him by phone today to discuss the admission process and coordinate admission to the facility - most likely tomorrow.  I also explained to the son, Amy [name], that he would need to sign the patient in at the facility once he was able to speak with the liaison on the phone.  I gave the son the [facility] address while I was speaking with him on the phone.  CM called and spoke with Amy [name], [facility] and asked that she call the son after 3 pm today at his request since he is in a business meeting.  Amy [name], [facility] will follow up with the patient's family.  CM and MSW with DTP Team will continue to follow the patient for discharge planning to [facility] - likely admission on 08/13/2021.   Expected Discharge Plan: [place] Barriers to Discharge: Continued Medical Work up  Expected Discharge Plan and Services Expected Discharge Plan: [place] In-house Referral: Clinical Social Work     Living arrangements for the past 2 months: [place] (Patient came to hospital from [place])                                       Social Determinants of Health (SDOH) Interventions    Readmission Risk Interventions Readmission Risk  Prevention Plan 08/04/2021  Transportation Screening Complete  Medication Review Press photographer) Complete  PCP or Specialist appointment within 3-5 days of discharge Complete  HRI or Home Care Consult Complete  SW Recovery Care/Counseling Consult Complete  Palliative Care Screening Complete  Skilled Nursing Facility Complete  Some recent data might be hidden

## 2021-08-12 NOTE — Progress Notes (Signed)
Manufacturing engineer [facility]) Hospital Liaison Note:  Update on this [place] Referral:  [place] is unable to offer a room for this patient today.   The [facility] Team will continue to follow this patient daily and will update family/TOC tomorrow or sooner if a bed becomes available. Unable to reach patient's son after multiple attempts via telephone and bedside visits - leaving [facility] contact information on voicemail for return call.  [facility] will continue to reach out to family to confirm interest and support family as needed. TOC Sharyn Lull is aware of the above.  Please call with any hospice related questions/concerns,  Gar Ponto, RN [facility] HLT  [phone]

## 2021-08-12 NOTE — Progress Notes (Addendum)
[facility] PROGRESS NOTE  VANILLA COOMBS [id number] DOB: 08-27-1946 DOA: 05/19/2021 PCP: [name], MD  Status:  Remains inpatient appropriate because:Unsafe d/c plan, IV treatments appropriate due to intensity of illness or inability to take PO, and Inpatient level of care appropriate due to severity of illness  Dispo: The patient is from: SNF              Anticipated d/c is to:  Residential hospice              Patient currently is medically stable to d/c.     Difficult to place patient Yes              Barriers to discharge: Son and his wife are visiting various residential hospice facility in the area before they make a decision peor his previous discussion with PMT provider Stanton Kidney.  On 10/17 [name] case manager called to speak with the son to determine if they had made any decisions-he still remained unclear regarding actual places to visit.  Sharyn Lull offered to have hospice liaison called him with options and he agreed.  COMFORT CARE  Level of care: Med-Surg  Code Status: DNR Family communication: Spoke with patient's son by telephone on 10/12.  Awaiting PMT discussion with son. DVT prophylaxis: Eliquis COVID vaccination status: [name] 02/26/2020, Pfizer 04/10/2021    HPI: 75 y.o. 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 Patient presented to the ED on 7/25 with dark stool.  On presentation, hemoglobin was 9.6; GI was consulted.  She underwent EGD which was normal and flexible sigmoidoscopy which showed nonbleeding distal rectal ulcers/stercoral ulcers.  Subsequently, GI signed off.  Neurology was also consulted for altered mental status;  MRI brain was similar to prior MRI at [facility] with a large right MCA stroke.  EEG this admission unremarkable. Nephrology has been following for dialysis needs. PEG tube was desired by family but could not be placed because of overlying transverse colon.  General surgery was consulted for G-tube placement but patient does not want a G-tube and  was deemed to have capacity by psych team.  Subsequently after further more detailed evaluations and observation it was finally clarified that she lacked capacity.  Surgical team was consulted the fact that the patient lacks underlying dysphagia and even though she lacks capacity continues to express a desire to not have a gastrostomy tube placed they are reluctant to proceed with surgery and have ordered follow-up SLP evaluation regarding dysphagia.  As of [id number] son still conflicted over whether or not to place GT and will use the next few days to discuss with other family members who will help him come to a decision.  10/10 Due to acute AMS CT of the head was obtained that revealed possible changes concerning for acute versus subacute stroke.  MRI showed similar findings.  Reviewed by stroke neurologist who was concerned that changes were more consistent with seizure etiology.  LTM EEG consistent with metabolic encephalopathy and stroke physiology  As of 10/12 son updated on above findings and worsening clinical status of his mother.  Plan is to no longer attempt to pursue feeding tube and instead focus on hospice care.  Son is visiting local hospice facilities before making a  decision about disposition.  On 10/17 hospice liaison contact the son since he is having difficulty with decision making regarding residential hospice but was unable to directly speak with him.   Subjective: Did awaken today and talk to me when I spoke to her.  Primarily Gaze to the right and appeared to be having visual hallucinations noting she was talking to a person that was not in the  room.  Objective: Vitals:   08/11/21 2138 08/12/21 0518  BP: (!) 176/77 113/63  Pulse: (!) 106 (!) 53  Resp: 17 19  Temp: 98.4 F (36.9 C) 97.6 F (36.4 C)  SpO2: 100% 95%    Intake/Output Summary (Last 24 hours) at 08/12/2021 0819 Last data filed at 08/12/2021 0515 Gross per 24 hour  Intake 0 ml  Output 0 ml  Net 0 ml   Filed Weights   08/01/21 0814 08/01/21 1220 08/06/21 2040  Weight: 68 kg 65 kg 96.2 kg    Exam:  Constitutional: Open eyes today, minimally conversant but also appeared to be experiencing visual hallucination Respiratory: Room air, lungs are clear -diminished in the bases, pulse oximetry normal, normal respiratory effort-O2 sat 95% Cardiovascular: S1-S2, normotensive, stable bilateral UE edema R>L Abdomen: LBM 10/12-still not eating, refusing all medications and liquids..  Abdomen soft nontender nondistended.  Bowel sounds + Neurologic: CN 2-12 grossly intact on visual inspection-sensation remains intact, no spontaneous movement and continues with bilateral upper extremity hypertonicity Psychiatric: Opens eyes, not oriented and appears to be experiencing visual hallucination  Assessment/Plan: Acute problems: Sepsis physiology/RLL HCAP/Abn blood cxs c/w contaminant Completed 7 days of Maxipime  Focus now will be on comfort  Failure to thrive with generalized deconditioning in context of neurogenic dysphagia/EOL care Given acute stroke patient is no longer a candidate for open G-tube placement.  Her son agrees -focus will now be on comfort.  Son and his wife reportedly were reviewing hospice facilities but once contact made again earlier this week it was determined they had not started this process yet noting PMT having difficulty consistently getting in contact with the son.  Hospice liaison has placed request to be can place Patient more awake and conversant than she has been in 2 weeks.  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  Suspected dementia/recent delirium See above regarding comfort measures  ESRD on HD Has transitioned to comfort care/hospice care have informed nephrology that dialysis has been discontinued  Type 2 diabetes mellitus Episodes of hypoglycemia Hemoglobin A1c 5.1.  Diet controlled at baseline but currently refusing all oral diet-focus at this time will be on comfort feeds  Chronic thrombus left upper extremity Eliquis discontinued with focus on comfort only  Stage II mid coccyx pressure injury, present on admission CT pelvis 10/10 without evidence of abscess-focus now on comfort care        Data Reviewed:  CBC: No results for input(s): WBC, NEUTROABS, HGB, HCT, MCV, PLT in  the last 168 hours.   CBG: Recent Labs  Lab 08/07/21 1107 08/07/21 1632 08/07/21 2101 08/08/21 0641 08/09/21 1635  GLUCAP 82 114* 132* 131* 168*     Scheduled Meds:  Chlorhexidine Gluconate Cloth  6 each Topical Q0600   feeding supplement  237 mL Oral BID BM   gabapentin  100 mg Oral Q12H   mirtazapine  7.5 mg Oral QHS   OLANZapine zydis  2.5 mg Oral QHS   sodium chloride flush  10-40 mL Intracatheter Q12H   venlafaxine  37.5 mg Oral BID     Principal Problem:   GI bleed Active Problems:   Essential hypertension   Type 2 diabetes mellitus with chronic kidney disease on  chronic dialysis, with long-term current use of insulin (HCC)   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   ESRD on dialysis ([place])   Dyslipidemia   Hypokalemia   Stage 2 skin ulcer of sacral region ([place])   Cerebral thrombosis with cerebral infarction   Protein-calorie malnutrition, severe   Acute GI bleeding   Oropharyngeal dysphagia   Failure to thrive in adult   Inadequate oral intake   Clogged feeding tube   Fever   Rectal bleeding   Delirium due to another medical condition   Neurogenic dysphagia   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 (HCC)   Leukocytosis   Abnormal chest x-ray   HCAP (healthcare-associated pneumonia)   Sepsis due to other etiology [facility])   Acute encephalopathy   Recurrent cerebrovascular accidents (CVAs) ([place])   End of life care   Consultants: Vascular surgery Nephrology Gastroenterology Neurology Palliative medicine General surgery  Procedures: EEG Echocardiogram Cortrack EGD  Antibiotics: Ceftriaxone 8/3 through 8/5   Time spent: 25 minutes    Erin Hearing ANP  [facility] 7 am - 330 pm/M-F for direct patient care and secure chat Please refer to Amion for contact info 84  days

## 2021-08-12 NOTE — TOC Progression Note (Addendum)
Transition of Care [facility]) - Progression Note    Patient Details  Name: Amy [name] MRN: [id number] Date of Birth: 1946-03-28  Transition of Care [facility]) CM/SW [place], RN Phone Number: 08/12/2021, 8:21 AM  Clinical Narrative:    [facility] liaison is continuing to follow the patient for placement at [facility] facility for available admission bed.  CM called and left a message with May Waunita Schooner, [facility] with [facility] [phone] to check on bed availability and review of patient's clinicals for  possible admission to the facility.  The patient is under comfort care at this time in the hospital.  CM and MSW with DTP Team continue to follow the patient for admission.  08/12/2021 1209 - CM spoke with Twana First, RNCM liaison with [facility] and the patient was determined to have eligibility for Inpatient hospice but the facility does not have an available bed at this time.  Noberto Retort, [facility] states that she was unable to reach the patient's son, Amy [name] on the phone but she left a message on his voicemail.  CM and MSW with DTP Team continues to follow the patient for admission to [facility] once the facility has an available bed and son is willing and able to communicate with [facility] regarding the patient's admission to the facility.   Expected Discharge Plan:  (To be determined.) Barriers to Discharge: Continued Medical Work up  Expected Discharge Plan and Services Expected Discharge Plan:  (To be determined.) In-house Referral: Clinical Social Work     Living arrangements for the past 2 months: [place] (Patient came to hospital from SNF [facility])                                       Social Determinants of Health (SDOH) Interventions    Readmission Risk Interventions Readmission Risk Prevention Plan 08/04/2021  Transportation Screening Complete  Medication Review Press photographer) Complete   PCP or Specialist appointment within 3-5 days of discharge Complete  HRI or Home Care Consult Complete  SW Recovery Care/Counseling Consult Complete  Palliative Care Screening Complete  Skilled Nursing Facility Complete  Some recent data might be hidden

## 2021-08-13 DIAGNOSIS — I639 Cerebral infarction, unspecified: Secondary | ICD-10-CM | POA: Diagnosis not present

## 2021-08-13 DIAGNOSIS — Z515 Encounter for palliative care: Secondary | ICD-10-CM | POA: Diagnosis not present

## 2021-08-13 DIAGNOSIS — R627 Adult failure to thrive: Secondary | ICD-10-CM | POA: Diagnosis not present

## 2021-08-13 DIAGNOSIS — N186 End stage renal disease: Secondary | ICD-10-CM | POA: Diagnosis not present

## 2021-08-13 LAB — CBC WITH DIFFERENTIAL/PLATELET
Abs Immature Granulocytes: 0 10*3/uL (ref 0.00–0.07)
Basophils Absolute: 0.1 10*3/uL (ref 0.0–0.1)
Basophils Relative: 1 %
Eosinophils Absolute: 0.2 10*3/uL (ref 0.0–0.5)
Eosinophils Relative: 2 %
HCT: 29.5 % — ABNORMAL LOW (ref 36.0–46.0)
Hemoglobin: 8.9 g/dL — ABNORMAL LOW (ref 12.0–15.0)
Lymphocytes Relative: 4 %
Lymphs Abs: 0.4 10*3/uL — ABNORMAL LOW (ref 0.7–4.0)
MCH: 28 pg (ref 26.0–34.0)
MCHC: 30.2 g/dL (ref 30.0–36.0)
MCV: 92.8 fL (ref 80.0–100.0)
Monocytes Absolute: 0.5 10*3/uL (ref 0.1–1.0)
Monocytes Relative: 5 %
Neutro Abs: 9.2 10*3/uL — ABNORMAL HIGH (ref 1.7–7.7)
Neutrophils Relative %: 88 %
Platelets: 280 10*3/uL (ref 150–400)
RBC: 3.18 MIL/uL — ABNORMAL LOW (ref 3.87–5.11)
RDW: 21.2 % — ABNORMAL HIGH (ref 11.5–15.5)
WBC: 10.5 10*3/uL (ref 4.0–10.5)
nRBC: 0 /100 WBC
nRBC: 0.3 % — ABNORMAL HIGH (ref 0.0–0.2)

## 2021-08-13 LAB — GLUCOSE, CAPILLARY
Glucose-Capillary: 33 mg/dL — CL (ref 70–99)
Glucose-Capillary: 104 mg/dL — ABNORMAL HIGH (ref 70–99)
Glucose-Capillary: 145 mg/dL — ABNORMAL HIGH (ref 70–99)
Glucose-Capillary: 50 mg/dL — ABNORMAL LOW (ref 70–99)
Glucose-Capillary: 91 mg/dL (ref 70–99)

## 2021-08-13 LAB — RESP PANEL BY RT-PCR (FLU A&B, COVID) ARPGX2
Influenza A by PCR: NEGATIVE
Influenza B by PCR: NEGATIVE
SARS Coronavirus 2 by RT PCR: NEGATIVE

## 2021-08-13 LAB — RENAL FUNCTION PANEL
Albumin: 1.7 g/dL — ABNORMAL LOW (ref 3.5–5.0)
Anion gap: 9 (ref 5–15)
BUN: 39 mg/dL — ABNORMAL HIGH (ref 8–23)
CO2: 26 mmol/L (ref 22–32)
Calcium: 8.8 mg/dL — ABNORMAL LOW (ref 8.9–10.3)
Chloride: 101 mmol/L (ref 98–111)
Creatinine, Ser: 7.41 mg/dL — ABNORMAL HIGH (ref 0.44–1.00)
GFR, Estimated: 5 mL/min — ABNORMAL LOW (ref >60)
Glucose, Bld: 135 mg/dL — ABNORMAL HIGH (ref 70–99)
Phosphorus: 4.3 mg/dL (ref 2.5–4.6)
Potassium: 5 mmol/L (ref 3.5–5.1)
Sodium: 136 mmol/L (ref 135–145)

## 2021-08-13 MED ORDER — DEXTROSE 50 % IV SOLN
50.0000 mL | INTRAVENOUS | Status: AC
  Administered 2021-08-13: 50 mL via INTRAVENOUS

## 2021-08-13 MED ORDER — APIXABAN 5 MG PO TABS
5.0000 mg | ORAL_TABLET | Freq: Two times a day (BID) | ORAL | Status: DC
  Administered 2021-08-13 – 2021-09-04 (×34): 5 mg via ORAL
  Filled 2021-08-13 (×37): qty 1

## 2021-08-13 MED ORDER — CHLORHEXIDINE GLUCONATE CLOTH 2 % EX PADS
6.0000 | MEDICATED_PAD | Freq: Every day | CUTANEOUS | Status: DC
  Administered 2021-08-14: 6 via TOPICAL

## 2021-08-13 MED ORDER — DEXTROSE 50 % IV SOLN
INTRAVENOUS | Status: AC
  Administered 2021-08-13: 50 mL
  Filled 2021-08-13: qty 50

## 2021-08-13 MED ORDER — INSULIN ASPART 100 UNIT/ML IJ SOLN
0.0000 [IU] | INTRAMUSCULAR | Status: DC
  Administered 2021-08-17 – 2021-08-18 (×2): 1 [IU] via SUBCUTANEOUS

## 2021-08-13 NOTE — Progress Notes (Signed)
During my earlier conversation with the patient's son today he stated that he would be coming to the hospital "around 3:00".  I waited on unit 5 AM from 2:30 PM to 3:30 PM.  Patient did not show up and there was no phone call to the unit secretary or to the patient's nurse stating that he would not be able to come today.

## 2021-08-13 NOTE — Progress Notes (Signed)
This RN has not been able to administer medications, patient will not take PO.  Also tried to give patient an Ensure and refused as well.    Will continue to monitor.  Aurther Loft, RN

## 2021-08-13 NOTE — TOC Progression Note (Signed)
Transition of Care [facility]) - Progression Note    Patient Details  Name: Amy [name] MRN: [id number] Date of Birth: 1945-12-14  Transition of Care [facility]) CM/SW [place], RN Phone Number: 08/13/2021, 8:29 AM  Clinical Narrative:    Case management spoke with Twana First, RNCM with [facility] on 08/12/2021 at 1600 and she plans to speak with the patient's son, Amy [name], this morning at 9 am regarding probable admission to [facility].  The facility has extended a bed offer to the patient for admission and the patient's son requested to speak with the patient's family last night to discuss placement at the facility.  [facility] has planned phone conversation with the patient's son at 52 am and will follow up with [facility] Team regarding plan.  Erin Hearing, NP was notified and she ordered a COVID screen this morning.  CM and MSW with [facility] will continue to follow for probable transition to [facility] today if family agrees.   Expected Discharge Plan: [place] Barriers to Discharge: Continued Medical Work up  Expected Discharge Plan and Services Expected Discharge Plan: [place] In-house Referral: Clinical Social Work     Living arrangements for the past 2 months: [place] (Patient came to hospital from [place])                                       Social Determinants of Health (SDOH) Interventions    Readmission Risk Interventions Readmission Risk Prevention Plan 08/04/2021  Transportation Screening Complete  Medication Review Press photographer) Complete  PCP or Specialist appointment within 3-5 days of discharge Complete  HRI or Home Care Consult Complete  SW Recovery Care/Counseling Consult Complete  Palliative Care Screening Complete  Skilled Nursing Facility Complete  Some recent data might be hidden

## 2021-08-13 NOTE — TOC Progression Note (Addendum)
Transition of Care [facility]) - Progression Note    Patient Details  Name: ANALINA KINDEL MRN: [id number] Date of Birth: Jun 13, 1946  Transition of Care [facility]) CM/SW [place], RN Phone Number: 08/13/2021, 10:08 AM  Clinical Narrative:    Case management spoke with Twana First, RNCM with [facility] this morning and states that she called the patient's son, Jenee Raz, this morning and he did not answer the phone or return her phone call relating to discussion for [facility] placement.  CM called and spoke with Erin Hearing, NP to make her aware so that she can call and speak with the patient's son on the phone.  CM and MSW with DTP Team will continue to follow the patient for TOC needs.  08/13/2021 - 1500 - CM and Lissa Merlin, NP spoke with the patient's son, Shontay Spadea earlier in day today to meet with him at the bedside at 1500 but the patient's son did not arrive for meeting as scheduled.   Expected Discharge Plan: [place] Barriers to Discharge: Continued Medical Work up  Expected Discharge Plan and Services Expected Discharge Plan: [place] In-house Referral: Clinical Social Work     Living arrangements for the past 2 months: [place] (Patient came to hospital from [place])                                       Social Determinants of Health (SDOH) Interventions    Readmission Risk Interventions Readmission Risk Prevention Plan 08/04/2021  Transportation Screening Complete  Medication Review Press photographer) Complete  PCP or Specialist appointment within 3-5 days of discharge Complete  HRI or Home Care Consult Complete  SW Recovery Care/Counseling Consult Complete  Palliative Care Screening Complete  Skilled Nursing Facility Complete  Some recent data might be hidden

## 2021-08-13 NOTE — Progress Notes (Addendum)
Patient has not been eating for multiple weeks.  CBG today 33.  1 amp IV D50  Renal panel demonstrates BUN 35 with a creatinine 7.41 calcium 5.0.  Baseline creatinine with hemodialysis is 3.47 with a BUN of 13 and a potassium of 3.4-3.6  Previous leukocytosis has resolved.  Hemoglobin 8.9.  Neutrophils are elevated at 88% with absolute neutrophils 9.2%.  Labs were drawn after patient received 1 amp of D50 with serum glucose 135  Patient is +1280 cc.  She is not experiencing any respiratory symptoms and her pulse oximetry remains 94%

## 2021-08-13 NOTE — Progress Notes (Signed)
Hypoglycemic Event  CBG: 50  Treatment: D50 50 mL (25 gm)  Symptoms: None  Follow-up CBG: Time:1720 CBG Result:145  Possible Reasons for Event: Inadequate meal intake  Comments/MD notified:MD notified    Vira Agar

## 2021-08-13 NOTE — Progress Notes (Signed)
Pt was able to take 2 of her medicines but then began choking on the medicine and RN suctioned pt's mouth. The rest of the medications were not administered. Will update medical team.

## 2021-08-13 NOTE — Plan of Care (Signed)
  Problem: Nutrition: Goal: Adequate nutrition will be maintained Outcome: Not Progressing   

## 2021-08-13 NOTE — Progress Notes (Signed)
Hypoglycemic Event  CBG: 33  Treatment: D50 50 mL (25 gm)  Symptoms: None  Follow-up CBG: Time:1210 CBG Result:104  Possible Reasons for Event: Inadequate meal intake  Comments/MD notified:MD notified    Vira Agar

## 2021-08-13 NOTE — Progress Notes (Addendum)
[facility] PROGRESS NOTE  Amy [name] [id number] DOB: [birth year] DOA: 05/19/2021 PCP: [name], MD  Status:  Remains inpatient appropriate because:Unsafe d/c plan, IV treatments appropriate due to intensity of illness or inability to take PO, and Inpatient level of care appropriate due to severity of illness  Dispo: The patient is from: SNF              Anticipated d/c is to:  Residential hospice              Patient currently is not medically stable to d/c.  Family wishes to resume hemodialysis in conversation on 10/19   Difficult to place patient Yes              Barriers to discharge: Son now stating it was his understanding that the patient would remain on hemodialysis until transition to residential hospice.  He states that other family members need to come in and out of town as well as other family members need to be included in the decision-making process.  He reports that on 10/18 when he spoke to his mother she stated that she to die and wanted to continue hemodialysis-he reported that since she is stating this he cannot go against her wishes and play God.  COMFORT CARE  Level of care: Med-Surg  Code Status: DNR Family communication: Spoke with patient's son by telephone on 10/12.  Awaiting PMT discussion with son. DVT prophylaxis: Eliquis COVID vaccination status: Amy [name] Overall 02/26/2020, Pfizer 04/10/2021    HPI: 75 y.o. 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 Patient presented to the ED on 7/25 with dark stool.  On presentation, hemoglobin was 9.6; GI was consulted.  She underwent EGD which was normal and flexible sigmoidoscopy which showed  nonbleeding distal rectal ulcers/stercoral ulcers.  Subsequently, GI signed off.  Neurology was also consulted for altered mental status; MRI brain was similar to prior MRI at [facility] with a large right MCA stroke.  EEG this admission unremarkable. Nephrology has been following for dialysis needs. PEG tube was desired by family but could not be placed because of overlying transverse colon.  General surgery was consulted for G-tube placement but patient does not want a G-tube and  was deemed to have capacity by psych team.  Subsequently after further more detailed evaluations and observation it was finally clarified that she lacked capacity.  Surgical team was consulted the fact that the patient lacks underlying dysphagia and even though she lacks capacity continues to express a desire to not have a gastrostomy tube placed they are reluctant to proceed with surgery and have ordered follow-up SLP evaluation regarding dysphagia.  As of [id number] son still conflicted over whether or not to place GT and will use the next few days to discuss with other family members who will help him come to a decision.  10/10 Due to acute AMS CT of the head was obtained that revealed possible changes concerning for acute versus subacute stroke.  MRI showed similar findings.  Reviewed by stroke neurologist who was concerned that changes were more consistent with seizure etiology.  LTM EEG consistent with metabolic encephalopathy and stroke physiology  As of 10/12 son updated on above findings and worsening clinical status of his mother.  Plan is to no longer attempt to pursue feeding tube and instead focus on hospice care.  Son is visiting local hospice facilities before making a decision about disposition.  On 10/17 hospice liaison contact the son since he is having difficulty with decision making regarding residential hospice but was unable to directly speak with him.  10/19 updated son regarding patient's declining status and that she  was nearing death.  I reminded him that we stopped dialysis a little over a week ago to which he responded "I did not think we were stopping dialysis until she went to hospice".  I told him that based on our conversation that to be considered for residential hospice we would have to stop dialysis and he agreed that she was not a candidate for a feeding tube based on recent stroke as well as neurogenic dysphagia and that dialysis not appropriate since we would not be placing a feeding tube.  Today I also reminded him that for her comfort continuing dialysis treatments to be futile.  He states that he visited her yesterday on 10/18 and she was alert and talking and when he asked her about stopping dialysis she stated that she did not want to die and she wanted to continue dialysis.  I reminded him that psychiatry and myself as well as the renal provider spent over an hour in the patient's room several weeks ago trying to determine her capacity to make decisions.  I reminded him that even though she states that she does not want to die and wants to continue dialysis that when we tried to get her to connect not eating and not wanting a feeding tube to continue dialysis she was unable to grasp the concept that not eating and not placing a feeding tube disqualify dialysis since she would slowly die from malnutrition and become clinically unstable from a dehydration standpoint to tolerate hemodialysis.  He was very focused the fact that she was alert and talking.  I reminded him that prior to death many if not most patients have a period of slight improvement just before the dying process begins and from an anecdotal standpoint we suspect this is related to the patient needing to tell family and other persons goodbye.  He states that he would talk to [name] who has assisted him in the past with interpreting what physicians have been telling him regarding his mother's care.  He states he will get back to me and plans on coming  to the hospital later today.  10/19 additional conversation held with [name] as well as goddaughter [name] who is a Marine scientist in [place] (conference call).  Discussed phone call made to [place] on 10/12 regarding end-of-life care and withdrawal of dialysis. [name] insists that I never told him that since she will not be getting a feeding tube she is not appropriate for dialysis and  his understanding of that conversation was that once we progressed to hospice care that dialysis would be stopped on date of discharge .  Multiple questions were answered regarding the recent stroke, whether the patient was taking her stroke prophylaxis medications prior to the stroke, why she was not a candidate for a PEG versus an open G-tube and why at this point she should be end-of-life care.  Discussed clinical rationale in depth with both [name] and [name].  [name] continues to state that on 10/18 his mother was alert and told him that she did not want to die and she wanted to continue dialysis and  he did not wish to play God with her life.  I re explained to him the psychiatric evaluation that led to his mother being deemed to lack capacity revealed that she does not understand that not eating will lead to her not being able to tolerate dialysis insistent on continuing hemodialysis and refusing feeding tube.  Kim had questions regarding what not tolerating dialysis meant and these were answered including the possibility of cardiac arrest on dialysis due to complications from malnutrition and dehydration.  I asked [name] if he wanted me to resume dialysis and he stated yes until the rest of the family could get here and they could meet with her and help with decision-making about hospice but also to spend time with her before she dies.  I did tell [name] that I was sorry he misunderstood our initial discussion on 10/12.  I also supported him and stated it is quite understandable to have significant difficulty in making these type of decisions for  our parents but sometimes we are put in this situation to have to do this whether we would like to or not.  I also dated that I understand that he needs more time before coming to certain decisions.  Subjective: Patient opens eyes to tactile stimulation and voice without eye contact.  Eyes focused towards ceiling.  Grunts but is nonverbal  Objective: Vitals:   08/12/21 0518 08/13/21 0503  BP: 113/63 (!) 91/47  Pulse: (!) 53 98  Resp: 19 18  Temp: 97.6 F (36.4 C) 98.6 F (37 C)  SpO2: 95% 94%   No intake or output data in the 24 hours ending 08/13/21 0828  Filed Weights   08/01/21 0814 08/01/21 1220 08/06/21 2040  Weight: 68 kg 65 kg 96.2 kg    Exam:  Constitutional: Open eyes today to voice and tactile stimulation but no eye contact noting eyes are directed toward the ceiling and she is otherwise nonverbal except for groans Respiratory: Room air, lungs are clear -diminished in the bases, pulse oximetry normal, normal respiratory effort-O2 sat 94% Cardiovascular: S1-S2, normotensive, stable bilateral UE edema R>L Abdomen: LBM 10/12-eaten for over 1 week abdomen soft nontender nondistended.  Bowel sounds + Neurologic: CN 2-12 grossly intact on visual inspection-sensation remains intact, no spontaneous movement and continues with bilateral upper extremity hypertonicity Psychiatric: Opens eyes, essentially nonverbal except for groans with eyes focused up towards the ceiling  Assessment/Plan: Acute problems: Sepsis physiology/RLL HCAP/Abn blood cxs c/w contaminant Completed 7 days of Maxipime  Failure to thrive with generalized deconditioning in context of neurogenic dysphagia/EOL care Given acute stroke patient is no longer a candidate for open G-tube placement.  Her son agrees -focus will now be on comfort.  Son and his wife reportedly were reviewing hospice facilities but once CM contacted son it was determined they had not started this process yet. PMT having difficulty  getting  in contact with the son.  Hospice liaison placed request to [facility], bed is available but son will not return phone calls regarding signing the patient in Patient more awake and conversant on 10/18 but today on 10/19 she opens her eyes to tactile stimulation and voice but does not make eye contact and vision is focused towards the ceiling Spoke at length with son (see above) Conversation with son held on 10/12: Spoke with patient's son by telephone on 10/12.  He was informed that neurology team has confirmed his mother has had another stroke despite appropriate stroke prophylaxis therapy in place.  I  also told him that her mentation has not improved and that she was no longer making eye contact and today she was nonverbal and not making attempts to speak noting yesterday she had significant dysarthria and would attempt to speak.  He was also reminded of persistent leukocytosis despite appropriate treatment of healthcare acquired pneumonia.  We discussed that given her change in mentation that she is probably developed a true mechanical dysphagia and is likely aspirating her oral secretions.  I also reminded him that with acute stroke she is no longer a candidate for any surgical procedures due to increased risk for extension of current stroke or additional strokes.  [name] agrees that at this point a feeding tube is not appropriate and states he would like to review hospice options.  I also reminded him that she is no longer appropriate for hemodialysis and he agreed.  We discussed that without dialysis given her current state at best she would live about 4 weeks.  He worked last night and will not be waking back up until about 2 or 3 PM.  Consult to palliative medicine team to further discuss options for inpatient versus outpatient hospice treatment.  Additional questions addressed.  Palliative medicine as well as the hospice liaison have reached out to the son multiple times.  PMT/Mary Davis Gourd has never  been able to have a conversation with the son.  Hospice liason did reach son 1 time and he agreed to [facility] but had to discuss with other family members.  Additional phone calls today on 10/19 were not answered.  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 Stat renal function panel has been ordered and I will contact nephrology about son's decision.-Spoke with Dr. Jonnie Finner with nephrology and he will see the patient in consultation [place] of hemodialysis  Type 2 diabetes mellitus Episodes of hypoglycemia Hemoglobin A1c 5.1.  10/19 resume CBG checks every 4 hours and provide SSI Continue Neurontin for peripheral neuropathy  Chronic thrombus left upper extremity Continue Eliquis  Stage II mid coccyx pressure injury, present on admission CT  pelvis 10/10 without evidence of abscess Continue local wound care        Data Reviewed:  CBC: No results for input(s): WBC, NEUTROABS, HGB, HCT, MCV, PLT in the last 168 hours.   CBG: Recent Labs  Lab 08/07/21 1107 08/07/21 1632 08/07/21 2101 08/08/21 0641 08/09/21 1635  GLUCAP 82 114* 132* 131* 168*     Scheduled Meds:  Chlorhexidine Gluconate Cloth  6 each  Topical Q0600   feeding supplement  237 mL Oral BID BM   gabapentin  100 mg Oral Q12H   mirtazapine  7.5 mg Oral QHS   OLANZapine zydis  2.5 mg Oral QHS   sodium chloride flush  10-40 mL Intracatheter Q12H   venlafaxine  37.5 mg Oral BID     Principal Problem:   GI bleed Active Problems:   Essential hypertension   Type 2 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, with long-term current use of insulin (HCC)   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   ESRD on dialysis ([place])   Dyslipidemia   Hypokalemia   Stage 2 skin ulcer of sacral region ([place])   Cerebral thrombosis with cerebral infarction   Protein-calorie malnutrition, severe   Acute GI bleeding   Oropharyngeal dysphagia   Failure to thrive in adult   Inadequate oral intake   Clogged feeding tube   Fever   Rectal bleeding   Delirium due to another medical condition   Neurogenic dysphagia   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 (HCC)   Leukocytosis   Abnormal chest x-ray   HCAP (healthcare-associated pneumonia)   Sepsis due to other etiology ([place])   Acute encephalopathy   Recurrent cerebrovascular accidents (CVAs) ([place])   End of life care   Consultants: Vascular surgery Nephrology Gastroenterology Neurology Palliative medicine General surgery  Procedures: EEG Echocardiogram Cortrack EGD  Antibiotics: Ceftriaxone 8/3 through 8/5   Time spent: 45 minutes    Erin Hearing ANP  [facility] 7 am - 330 pm/M-F for direct patient care and  secure chat Please refer to Amion for contact info 85  days   Pt seen and examined at bedside. Agree with Erin Hearing detailed progress note.  Hosie Poisson MD

## 2021-08-13 NOTE — Discharge Summary (Incomplete)
Physician Discharge Summary  Amy [name] [id number] DOB: February 19, 1946 DOA: 05/19/2021  PCP: [name], MD  Admit date: 05/19/2021 Discharge date: 08/13/2021  Time spent: 35 minutes  Recommendations for Outpatient Follow-up:  And is to discharge to [facility] residential hospice for continued end-of-life care    Discharge Diagnoses:  Active Problems:   Essential hypertension   Type 2 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, with long-term current use of insulin (HCC)   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   ESRD on dialysis ([place])   Dyslipidemia   Stage 2 skin ulcer of sacral region [facility])   Cerebral thrombosis with cerebral infarction   Protein-calorie malnutrition, severe   Acute GI bleeding   Failure to thrive in adult   Rectal bleeding   Delirium due to another medical condition   Neurogenic dysphagia   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 Patient presented to the ED on 7/25 with dark stool.  On presentation, hemoglobin was 9.6; GI was consulted.  She underwent EGD which was normal and flexible sigmoidoscopy which showed nonbleeding distal rectal  ulcers/stercoral ulcers.  Subsequently, GI signed off.   Neurology was also consulted for altered mental status; MRI brain was similar to prior MRI at [facility] with a large right MCA stroke.  EEG this admission unremarkable. Nephrology has been following for dialysis needs. PEG tube was desired by family but could not be placed because of overlying transverse colon.  General surgery was consulted for G-tube placement but patient does not want a G-tube and  was deemed to have capacity by psych team.  Subsequently after further more detailed evaluations and observation it was finally clarified that she lacked capacity.  Surgical team was consulted the fact that the patient lacks underlying dysphagia and even though she lacks capacity continues to express a desire to not have a gastrostomy tube placed they are reluctant to proceed with surgery and have ordered follow-up SLP evaluation regarding dysphagia.  As of [id number] son still conflicted over whether or not to place GT and will use the next few days to discuss with other family members who will help him come to a decision.   10/10 Due to acute AMS CT of the head was obtained that revealed possible changes concerning for acute versus subacute stroke.  MRI showed similar findings.  Reviewed by stroke neurologist who was concerned that changes were more consistent with seizure etiology.  LTM EEG consistent with metabolic encephalopathy and stroke physiology   As of 10/12 son updated on above findings and worsening clinical status of his mother.  Plan is to no longer attempt to pursue feeding tube and instead focus on hospice care.  Son is visiting local hospice facilities before making a decision about disposition.  On 10/18 hospice liaison spoke with son and was made aware a bed would be available at [facility] residential hospice on 10/19.  He stated he was agreeable to this disposition but wanted to discuss with her rest of the family before signing admission  paperwork.   Hospital Course:  Sepsis physiology/RLL HCAP/Abn blood cxs c/w contaminant Completed 7 days of Maxipime -Focus now will be on comfort   Failure to thrive with generalized deconditioning in context of neurogenic dysphagia/EOL care Given acute stroke patient is no longer a candidate for open G-tube placement.  Her son agrees -focus will now be on comfort.  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  ESRD on HD Has transitioned to comfort care/hospice care have informed nephrology that dialysis has been discontinued       Acute on chronic lower GI bleeding from stercoral ulcers Suspected dementia/recent delirium Type 2 diabetes mellitus Chronic thrombus left upper extremity Stage II mid coccyx pressure injury, present on admission No longer receiving active treatment for these conditions noting she is now comfort care with plans to transition to residential hospice   Procedures:   Consultations:   Discharge Exam: Vitals:   08/12/21 0518 08/13/21 0503  BP: 113/63 (!) 91/47  Pulse: (!) 53 98  Resp: 19 18  Temp: 97.6 F (36.4 C) 98.6 F (37 C)  SpO2: 95% 94%    General: *** Cardiovascular: *** Respiratory: ***  Discharge Instructions    Allergies as of 08/13/2021       Reactions   Sensipar [cinacalcet]    Per MAR   Statins    Per  MAR        Medication List     STOP taking these medications    acetaminophen 500 MG tablet Commonly known as: TYLENOL Replaced by: acetaminophen 160 MG/5ML solution   apixaban 5 MG Tabs tablet Commonly known as: ELIQUIS   atorvastatin 40 MG tablet Commonly known as: LIPITOR   dibucaine 1 % Oint Commonly known as: NUPERCAINAL   diclofenac Sodium 1 % Gel Commonly known as: VOLTAREN   ENSURE CLEAR PO   FLUoxetine 40 MG capsule Commonly known as: PROZAC   gabapentin 100 MG capsule Commonly known as: NEURONTIN Replaced by: gabapentin 250 MG/5ML solution   hydrocortisone 1 % ointment   lactulose 10 GM/15ML solution Commonly known as: CHRONULAC   lidocaine 5 % Commonly known as: LIDODERM   lidocaine-prilocaine cream Commonly known as: EMLA   midodrine 10 MG tablet Commonly known as: PROAMATINE   multivitamin Tabs tablet   nystatin cream Commonly known as: MYCOSTATIN   ondansetron 4 MG tablet Commonly known as: ZOFRAN Replaced by: ondansetron 4 MG/2ML Soln injection   oxyCODONE 5 MG immediate release tablet Commonly known as: Oxy IR/ROXICODONE   pantoprazole 20 MG tablet Commonly known as: PROTONIX   venlafaxine XR 75 MG 24 hr capsule Commonly known as: EFFEXOR-XR Replaced by: venlafaxine 37.5 MG tablet  TAKE these medications    acetaminophen 160 MG/5ML solution Commonly known as: TYLENOL Take 20.3 mLs (650 mg total) by mouth every 6 (six) hours as needed for mild pain, fever or headache. Replaces: acetaminophen 500 MG tablet   antiseptic oral rinse Liqd Apply 15 mLs topically as needed for dry mouth.   gabapentin 250 MG/5ML solution Commonly known as: NEURONTIN Take 2 mLs (100 mg total) by mouth every 12 (twelve) hours. Replaces: gabapentin 100 MG capsule   glycopyrrolate 1 MG tablet Commonly known as: ROBINUL Take 1 tablet (1 mg total) by mouth every 4 (four) hours as needed (excessive secretions).   glycopyrrolate 0.2 MG/ML  injection Commonly known as: ROBINUL Inject 1 mL (0.2 mg total) into the skin every 4 (four) hours as needed (excessive secretions).   glycopyrrolate 0.2 MG/ML injection Commonly known as: ROBINUL Inject 1 mL (0.2 mg total) into the vein every 4 (four) hours as needed (excessive secretions).   haloperidol 0.5 MG tablet Commonly known as: HALDOL Take 1 tablet (0.5 mg total) by mouth every 4 (four) hours as needed for agitation (or delirium).   haloperidol 2 MG/ML solution Commonly known as: HALDOL Place 0.3 mLs (0.6 mg total) under the tongue every 4 (four) hours as needed for agitation (or delirium).   haloperidol lactate 5 MG/ML injection Commonly known as: HALDOL Inject 0.1 mLs (0.5 mg total) into the vein every 4 (four) hours as needed (or delirium).   ipratropium-albuterol 0.5-2.5 (3) MG/3ML Soln Commonly known as: DUONEB Take 3 mLs by nebulization every 6 (six) hours as needed.   LORazepam 1 MG tablet Commonly known as: ATIVAN Take 1 tablet (1 mg total) by mouth every 4 (four) hours as needed for anxiety.   LORazepam 2 MG/ML injection Commonly known as: ATIVAN Inject 0.5 mLs (1 mg total) into the vein every 4 (four) hours as needed for anxiety.   mirtazapine 7.5 MG tablet Commonly known as: REMERON Take 7.5 mg by mouth at bedtime.   morphine CONCENTRATE 10 MG/0.5ML Soln concentrated solution Take 0.25 mLs (5 mg total) by mouth every 2 (two) hours as needed for moderate pain (or dyspnea).   morphine CONCENTRATE 10 MG/0.5ML Soln concentrated solution Place 0.25 mLs (5 mg total) under the tongue every 2 (two) hours as needed for moderate pain (or dyspnea).   OLANZapine zydis 5 MG disintegrating tablet Commonly known as: ZYPREXA Take 0.5 tablets (2.5 mg total) by mouth at bedtime.   ondansetron 4 MG disintegrating tablet Commonly known as: ZOFRAN-ODT Take 1 tablet (4 mg total) by mouth every 6 (six) hours as needed for nausea.   ondansetron 4 MG/2ML Soln  injection Commonly known as: ZOFRAN Inject 2 mLs (4 mg total) into the vein every 6 (six) hours as needed for nausea. Replaces: ondansetron 4 MG tablet   polyvinyl alcohol 1.4 % ophthalmic solution Commonly known as: LIQUIFILM TEARS Place 1 drop into both eyes 4 (four) times daily as needed for dry eyes.   venlafaxine 37.5 MG tablet Commonly known as: EFFEXOR Take 1 tablet (37.5 mg total) by mouth 2 (two) times daily. Replaces: venlafaxine XR 75 MG 24 hr capsule       Allergies  Allergen Reactions   Sensipar [Cinacalcet]     Per MAR   Statins     Per MAR    Contact information for follow-up providers     [facility] Follow up.   Specialty: Hospice and Palliative Medicine Why: [facility] will be following the patient for admission. Contact information:  [place] [phone]             Contact information for after-discharge care     Destination     [facility] Preferred SNF .   Service: Skilled Nursing Contact information: [address] [place] [phone]                      The results of significant diagnostics from this hospitalization (including imaging, microbiology, ancillary and laboratory) are listed below for reference.    Significant Diagnostic Studies: CT HEAD WO CONTRAST (5MM)  Result Date: 08/04/2021 CLINICAL DATA:  Mental status change EXAM: CT HEAD WITHOUT CONTRAST TECHNIQUE: Contiguous axial images were obtained from the base of the skull through the vertex without intravenous contrast. COMPARISON:  05/25/2021 CT head, correlation is also made with 05/27/2021 MRI brain FINDINGS: Brain: Area of increased hypodensity in the right posterior frontal and anterior parietal lobe (series 4, image 13), which may reflect sequela of prior infarct but could reflect acute or subacute infarct. Additional areas of increased hypodensity in the right thalamus  and frontal, parietal, posterior temporal, and lateral occipital lobes are likely the expected evolution of infarct noted on 712022 and 05/27/2021. Periventricular white matter changes, likely the sequela of chronic small vessel ischemic disease. No acute hemorrhage, hydrocephalus, extra-axial collection, mass, mass effect, or midline shift. Vascular: No hyperdense vessel or unexpected calcification. Skull: Normal. Negative for fracture or focal lesion. Sinuses/Orbits: Mucosal thickening in the right sphenoid sinus. Status post bilateral lens replacements. Other: Fluid in the right-greater-than-left mastoid air cells. IMPRESSION: 1. Area of hypodensity in the right posterior frontal and anterior parietal lobe, which does not specifically correspond to an area of infarct on the prior exams and may reflect additional acute or subacute infarct. An MRI is recommended for further evaluation. 2. Additional areas of increased hypodensity in the right cerebral hemisphere in areas of prior infarct from 05/25/2021, likely expected evolution of prior infarct. No acute hemorrhage. These results were called by telephone at the time of interpretation on 08/04/2021 at 12:04 pm to provider Erin Hearing , who verbally acknowledged these results. Electronically Signed   By: Merilyn Baba M.D.   On: 08/04/2021 12:05   CT PELVIS W CONTRAST  Result Date: 08/04/2021 CLINICAL DATA:  Unexplained leukocytosis. Sacral decubitus ulcer. Evaluate for abscess. EXAM: CT PELVIS WITH CONTRAST TECHNIQUE: Multidetector CT imaging of the pelvis was performed using the standard protocol following the bolus administration of intravenous contrast. CONTRAST:  159m OMNIPAQUE IOHEXOL 300 MG/ML  SOLN COMPARISON:  None. FINDINGS: Urinary Tract:  No abnormality visualized. Bowel: Unremarkable visualized pelvic bowel loops. No evidence of perirectal/perianal abscess. Vascular/Lymphatic: No pathologically enlarged lymph nodes. No significant vascular  abnormality seen. Reproductive:  Uterus and adnexa unremarkable.  No mass. Other: No free fluid or free air. Diffuse edema throughout the subcutaneous soft tissues of the pelvis and upper thighs compatible with anasarca. Musculoskeletal: No visible sacral decubitus ulcer noted. No soft tissue abscess or acute bony abnormality. No evidence of osteomyelitis. IMPRESSION: No visible abscess. Anasarca like edema throughout the subcutaneous soft tissues. Electronically Signed   By: KRolm BaptiseM.D.   On: 08/04/2021 12:14   MR BRAIN WO CONTRAST  Result Date: 08/04/2021 CLINICAL DATA:  Stroke suspected EXAM: MRI HEAD WITHOUT CONTRAST TECHNIQUE: Multiplanar, multiecho pulse sequences of the brain and surrounding structures were obtained without intravenous contrast. COMPARISON:  MRI 05/27/2021, correlation is also made with CT head 08/04/2021  FINDINGS: Brain: Cortical restricted diffusion with ADC correlate in the right posterior frontal lobe, right parietal lobe, posterior right temporal lobe, and right occipital lobe (series 3, images 36). Some of these areas are in or closely associated with areas of remote infarct. Some of these areas are associated with T2 signal. No acute or chronic hemorrhage. Redemonstrated areas of encephalomalacia in the right frontal, temporal, parietal, and occipital lobes. Ex vacuo dilatation right lateral ventricle. No hydrocephalus. No extra-axial collection. Vascular: Normal flow voids. Skull and upper cervical spine: Normal marrow signal. Sinuses/Orbits: Negative.  Status post bilateral lens replacements. Other: Fluid throughout the bilateral mastoid air cells. IMPRESSION: Cortical restricted diffusion in the right posterior frontal, posterior temporal, parietal, and occipital lobes, concerning for acute and/or subacute infarcts. These results were called by telephone at the time of interpretation on 08/04/2021 at 3:27 pm to provider [facility] , who verbally acknowledged these  results. Electronically Signed   By: Merilyn Baba M.D.   On: 08/04/2021 15:39   DG CHEST PORT 1 VIEW  Result Date: 08/04/2021 CLINICAL DATA:  Leukocytosis. EXAM: PORTABLE CHEST 1 VIEW COMPARISON:  Prior chest radiographs 07/29/2021 and earlier. FINDINGS: Unchanged position of a left-sided central venous catheter with tip projecting at the level of the superior cavoatrial junction. The cardiomediastinal silhouette is unchanged. Aortic atherosclerosis. Redemonstrated chronic elevation of the right hemidiaphragm. Persistent small right pleural effusion. Persistent right basilar opacity which may reflect atelectasis and/or pneumonia. No left pleural effusion. No evidence of pneumothorax. No acute bony abnormality identified. IMPRESSION: Chronic elevation of the right hemidiaphragm. Small right pleural effusion with right basilar atelectasis and/or pneumonia, not significantly changed from the chest radiograph of 07/29/2021. Aortic Atherosclerosis (ICD10-I70.0). Electronically Signed   By: Kellie Simmering D.O.   On: 08/04/2021 09:10   DG CHEST PORT 1 VIEW  Result Date: 07/29/2021 CLINICAL DATA:  Leukocytosis, admitted for rectal bleed EXAM: PORTABLE CHEST 1 VIEW COMPARISON:  Chest radiograph 05/01/2021 FINDINGS: There is a left-sided tunneled central venous catheter in place terminating in the region of the superior cavoatrial junction. The cardiomediastinal silhouette is grossly stable. There is unchanged significant asymmetric elevation of the right hemidiaphragm. There is a small right pleural effusion with adjacent airspace disease, new since 05/01/2021. The left lung is clear. There is no left effusion. There is no pneumothorax. The bones are stable. IMPRESSION: 1. New small right pleural effusion with adjacent airspace disease. Infection can not be excluded. 2. Unchanged significant asymmetric elevation of the right hemidiaphragm. Electronically Signed   By: Valetta Mole M.D.   On: 07/29/2021 11:45   EEG  adult  Result Date: 08/03/2021 Lora Havens, MD     08/03/2021  1:51 PM Patient Name: ALEXCIS POSADAS MRN: [id number] Epilepsy Attending: Lora Havens Referring Physician/Provider: Dr Eulogio Bear Date: 08/03/2021 Duration: 24.72mns  Patient history: 75year old female with prior strokes with altered mental status and twitching on left side of face.  EEG to evaluate for seizures.  Level of alertness: Awake  AEDs during EEG study: GBP  Technical aspects: This EEG study was done with scalp electrodes positioned according to the 10-20 International system of electrode placement. Electrical activity was acquired at a sampling rate of '500Hz'$  and reviewed with a high frequency filter of '70Hz'$  and a low frequency filter of '1Hz'$ . EEG data were recorded continuously and digitally stored.  Description: This EEG study was done with scalp electrodes positioned according to the 10-20 International system of electrode placement. Electrical activity was acquired at a sampling  rate of '500Hz'$  and reviewed with a high frequency filter of '70Hz'$  and a low frequency filter of '1Hz'$ . EEG data were recorded continuously and digitally stored.  Description: EEG showed continuous generalized and lateralized right hemisphere 3-'5hz'$  theta-delta slowing which at times appear sharply contoured, specially when stimulated. Hyperventilation and photic stimulation were not performed.    ABNORMALITY -Continuous slow, generalized and lateralized right hemisphere  IMPRESSION: This study is suggestive of cortical dysfunction arising from right hemisphere likely secondary to underlying structural abnormality/stroke.  There is also moderate diffuse encephalopathy, nonspecific etiology.  No seizures or definite epileptiform discharges were seen throughout the recording. If concern for ictal-interical activity persists, please consider long term monitoring.  Priyanka Barbra Sarks   Overnight EEG with video  Result Date: 08/05/2021 Lora Havens, MD      08/05/2021  9:38 AM Patient Name: CHARELLE FORREN MRN: [id number] Epilepsy Attending: Lora Havens Referring Physician/Provider: Clance Boll, NP Duration: 08/04/2021 2132 to 08/05/2021 0945  Patient history: 75 year old female with prior strokes with altered mental status and twitching on left side of face.  EEG to evaluate for seizures.  Level of alertness: awake  AEDs during EEG study: GBP  Technical aspects: This EEG study was done with scalp electrodes positioned according to the 10-20 International system of electrode placement. Electrical activity was acquired at a sampling rate of '500Hz'$  and reviewed with a high frequency filter of '70Hz'$  and a low frequency filter of '1Hz'$ . EEG data were recorded continuously and digitally stored.  Description: This EEG study was done with scalp electrodes positioned according to the 10-20 International system of electrode placement. Electrical activity was acquired at a sampling rate of '500Hz'$  and reviewed with a high frequency filter of '70Hz'$  and a low frequency filter of '1Hz'$ . EEG data were recorded continuously and digitally stored.  Description: EEG showed continuous generalized and lateralized right hemisphere 3-'5hz'$  theta-delta slowing. Generalized periodic discharges with triphasic morphology at 0.5-1.5 Hz were also noted, more prominent when awake/stimulated. Hyperventilation and photic stimulation were not performed.    ABNORMALITY -Continuous slow, generalized and lateralized right hemisphere -Periodic discharges with triphasic morphology, Generalized  IMPRESSION: This study showed generalized periodic discharges with triphasic morphology at 0.5-1.5 Hz which can be on the ictal-interictal continuum but given the morphology, frequency and reactivity to stimulation, is more likely suggestive of toxic-metabolic causes. There is also cortical dysfunction arising from right hemisphere likely secondary to underlying stroke. Additionally there is moderate diffuse  encephalopathy, nonspecific etiology but could be secondary to toxic-metabolic causes. No seizures were seen throughout the recording.  Lora Havens    Microbiology: Recent Results (from the past 240 hour(s))  Culture, blood (routine x 2)     Status: None   Collection Time: 08/04/21  8:47 AM   Specimen: BLOOD RIGHT HAND  Result Value Ref Range Status   Specimen Description BLOOD RIGHT HAND  Final   Special Requests   Final    BOTTLES DRAWN AEROBIC AND ANAEROBIC Blood Culture adequate volume   Culture   Final    NO GROWTH 5 DAYS Performed at [facility], [address], [place], [place] [address]    Report Status 08/09/2021 FINAL  Final  Culture, blood (routine x 2)     Status: None   Collection Time: 08/04/21  8:48 AM   Specimen: BLOOD RIGHT HAND  Result Value Ref Range Status   Specimen Description BLOOD RIGHT HAND  Final   Special Requests   Final  BOTTLES DRAWN AEROBIC AND ANAEROBIC Blood Culture adequate volume   Culture   Final    NO GROWTH 5 DAYS Performed at [facility], [address], [place], [place] [address]    Report Status 08/09/2021 FINAL  Final     Labs: Basic Metabolic Panel: No results for input(s): NA, K, CL, CO2, GLUCOSE, BUN, CREATININE, CALCIUM, MG, PHOS in the last 168 hours. Liver Function Tests: No results for input(s): AST, ALT, ALKPHOS, BILITOT, PROT, ALBUMIN in the last 168 hours. No results for input(s): LIPASE, AMYLASE in the last 168 hours. No results for input(s): AMMONIA in the last 168 hours. CBC: No results for input(s): WBC, NEUTROABS, HGB, HCT, MCV, PLT in the last 168 hours. Cardiac Enzymes: No results for input(s): CKTOTAL, CKMB, CKMBINDEX, TROPONINI in the last 168 hours. BNP: BNP (last 3 results) No results for input(s): BNP in the last 8760 hours.  ProBNP (last 3 results) No results for input(s): PROBNP in the last 8760 hours.  CBG: Recent Labs  Lab 08/07/21 1107 08/07/21 1632 08/07/21 2101  08/08/21 0641 08/09/21 1635  GLUCAP 82 114* 132* 131* 168*       Signed:  Erin Hearing ANP [facility] 08/13/2021, 9:26 AM

## 2021-08-13 NOTE — Progress Notes (Signed)
[facility] Progress Note   Subjective:   Patient previously received HD but patient's family decided to pursue comfort measures so HD was stopped. informed patient's son now wants HD resumed. Her last dialysis was 10/10 and last labs were 10/11. Pt seen in room. She opens eyes to voice but is not answering any questions or following commands. Appears comfortable.   Objective Vitals:   08/11/21 2138 08/12/21 0518 08/13/21 0503 08/13/21 0944  BP: (!) 176/77 113/63 (!) 91/47 (!) 94/49  Pulse: (!) 106 (!) 53 98 100  Resp: '17 19 18   '$ Temp: 98.4 F (36.9 C) 97.6 F (36.4 C) 98.6 F (37 C)   TempSrc:   Axillary   SpO2: 100% 95% 94%   Weight:      Height:       Physical Exam General: Ill appearing female in NAD Heart: Regular rhythm, slightly tachycardic. 2/6 systolc murmur Lungs: CTA anteriorly Abdomen: Soft, non-distended, +BS, no gaurding Extremities: No edema b/l lower extremities Dialysis Access: [facility] dressing clean appearing  Additional Objective Labs: Basic Metabolic Panel: No results for input(s): NA, K, CL, CO2, GLUCOSE, BUN, CREATININE, CALCIUM, PHOS in the last 168 hours.  Invalid input(s): ALB Liver Function Tests: No results for input(s): AST, ALT, ALKPHOS, BILITOT, PROT, ALBUMIN in the last 168 hours. No results for input(s): LIPASE, AMYLASE in the last 168 hours. CBC: No results for input(s): WBC, NEUTROABS, HGB, HCT, MCV, PLT in the last 168 hours. Blood Culture    Component Value Date/Time   SDES BLOOD RIGHT HAND 08/04/2021 0848   SPECREQUEST  08/04/2021 0848    BOTTLES DRAWN AEROBIC AND ANAEROBIC Blood Culture adequate volume   CULT  08/04/2021 0848    NO GROWTH 5 DAYS Performed at [facility], [place] [address] [place], [place] [address]    REPTSTATUS 08/09/2021 FINAL 08/04/2021 0848    Cardiac Enzymes: No results for input(s): CKTOTAL, CKMB, CKMBINDEX, TROPONINI in the last 168 hours. CBG: Recent Labs  Lab 08/07/21 1107  08/07/21 1632 08/07/21 2101 08/08/21 0641 08/09/21 1635  GLUCAP 82 114* 132* 131* 168*   Iron Studies: No results for input(s): IRON, TIBC, TRANSFERRIN, FERRITIN in the last 72 hours. '@lablastinr3'$ @ Studies/Results: No results found. Medications:   apixaban  5 mg Oral BID   Chlorhexidine Gluconate Cloth  6 each Topical Q0600   feeding supplement  237 mL Oral BID BM   gabapentin  100 mg Oral Q12H   insulin aspart  0-6 Units Subcutaneous Q4H   mirtazapine  7.5 mg Oral QHS   OLANZapine zydis  2.5 mg Oral QHS   sodium chloride flush  10-40 mL Intracatheter Q12H   venlafaxine  37.5 mg Oral BID    Dialysis Orders: AF MWF  3h 29mn 400/500 62.5kg 3K/2.5 Ca P2 AVG HeRO   Hep none - Mircera 50 q 2 wks  (7/13)  Venofer 50 q wk - Parsabiv '5mg'$  IV q HD - [facility]  Assessment/Plan: AMS/FTT: Mental status that waxes/wanes. She appears very tired and weak. Family had decided on comfort measures and has now decided they would like her to continue HD. Melena/Rectal bleeding - GI consulted. Hx rectal ulcers.  EGD 7/27 normal, flex sig with mucosal ulceration c/w sterocoral ulcers, non bleeding. Hgb 8s---dropped to 6.5 on 10/10. S/p prbcs on 10/10. Need updated CBC.  Leukocytosis -- ?Pneumonia/?Bacteremia: On IV abx - per primary. Blood cultures +S. Epidermidis. Need updated CBC ESRD: Her last dialysis was 08/04/21. She has not  had labs since 08/05/21. Need updated labs before ordering dialysis. Suspect she is likely uremic since it has been over a week since last HD.  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 AVG clotted 06/27/21, unable to be declotted d/t arm contracture. S/P conversion to tunneled HD catheter 07/02/21.  Hypertension/volume: BP variable, was on midodrine prior to comfort measures being initiated, will restart with dialysis PRN Anemia of ESRD:  continue Aranesp 231mg q Fri. 1 unit prbcs on 08/04/21. Will  resume ESA if needed. 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 Needs updated labs Nutrition: Alb very low. Family decided against feeding tube. If she is unable to eat she will not tolerate dialysis for very long.  Recent CVA with no evidence of meaningful recovery Psych eval from 9/14 determined that patient has functional mental capacity for medical decision-making. Psych re-consulted 9/23 to evaluate the patient again - at this point, patient does not have the capacity to make medical decisions. Ongoing discussions with patient's son continues Dispo: DNR/DNI. Poor prognosis. Family was interested in hospice and patient was on comfort measures, has now changed their mind. Patient will need to be strong enough to tolerate dialysis in a recliner before she can be discharged to outpatient.   SAnice Paganini PA-C 08/13/2021, 11:21 AM  [facility] Pager: [phone]

## 2021-08-13 NOTE — Progress Notes (Addendum)
Manufacturing engineer [facility]) Hospital Liaison Note:  Update on this [facility] Referral:  Spoke to patient's son last night to confirm interest in residential hospice, explain hospice philosophy and answer questions.about Palliative Care and Hospice care in a residential facility. At end of our conversation, [name] stated "Still processing all this information - wanting to make the right decision so need time to think and discuss with my family about the pathway to take with my mother". Supported and listened to patient's son and encouraged him to take time so he could feel good about the decisions he needed to make about End of life care for his mother.   [name] has [facility] contact information and was to call me this morning at 9am to accept or decline bed offer at [facility] for transfer today. Made TOC aware that patient's son has not reached out to [facility] this morning and it was decided that [facility] would wait, continue to follow patient and support family as needed at this time to give patient's son space to make the decisions for his mother's care.  Please reach out with any hospice related concerns or questions as we would like to participate in supporting this family as they need,  Gar Ponto, RN [facility] HLT [phone]

## 2021-08-14 ENCOUNTER — Telehealth: Payer: Self-pay | Admitting: Family Medicine

## 2021-08-14 DIAGNOSIS — I639 Cerebral infarction, unspecified: Secondary | ICD-10-CM | POA: Diagnosis not present

## 2021-08-14 DIAGNOSIS — Z515 Encounter for palliative care: Secondary | ICD-10-CM | POA: Diagnosis not present

## 2021-08-14 DIAGNOSIS — N186 End stage renal disease: Secondary | ICD-10-CM | POA: Diagnosis not present

## 2021-08-14 DIAGNOSIS — R627 Adult failure to thrive: Secondary | ICD-10-CM | POA: Diagnosis not present

## 2021-08-14 LAB — GLUCOSE, CAPILLARY
Glucose-Capillary: 70 mg/dL (ref 70–99)
Glucose-Capillary: 70 mg/dL (ref 70–99)
Glucose-Capillary: 73 mg/dL (ref 70–99)
Glucose-Capillary: 75 mg/dL (ref 70–99)
Glucose-Capillary: 77 mg/dL (ref 70–99)
Glucose-Capillary: 79 mg/dL (ref 70–99)
Glucose-Capillary: 80 mg/dL (ref 70–99)
Glucose-Capillary: 81 mg/dL (ref 70–99)
Glucose-Capillary: 83 mg/dL (ref 70–99)
Glucose-Capillary: 89 mg/dL (ref 70–99)

## 2021-08-14 MED ORDER — DEXTROSE 50 % IV SOLN
INTRAVENOUS | Status: AC
  Filled 2021-08-14: qty 50

## 2021-08-14 MED ORDER — ALTEPLASE 2 MG IJ SOLR
INTRAMUSCULAR | Status: AC
  Administered 2021-08-14: 4 mg
  Filled 2021-08-14: qty 4

## 2021-08-14 MED ORDER — DEXTROSE 50 % IV SOLN
INTRAVENOUS | Status: AC
  Administered 2021-08-14: 50 mL
  Filled 2021-08-14: qty 50

## 2021-08-14 MED ORDER — CHLORHEXIDINE GLUCONATE CLOTH 2 % EX PADS
6.0000 | MEDICATED_PAD | Freq: Every day | CUTANEOUS | Status: DC
  Administered 2021-08-17 – 2021-08-19 (×3): 6 via TOPICAL

## 2021-08-14 MED ORDER — CHLORHEXIDINE GLUCONATE CLOTH 2 % EX PADS: 6.0000 | MEDICATED_PAD | Freq: Every day | CUTANEOUS | Status: DC

## 2021-08-14 MED ORDER — DARBEPOETIN ALFA 200 MCG/0.4ML IJ SOSY
200.0000 ug | PREFILLED_SYRINGE | INTRAMUSCULAR | Status: DC
  Administered 2021-08-15 – 2021-08-22 (×2): 200 ug via INTRAVENOUS
  Filled 2021-08-14 (×2): qty 0.4

## 2021-08-14 NOTE — Progress Notes (Signed)
[facility] PROGRESS NOTE  KENEDY HAISLEY YOV:[id number] DOB: December 30, 1945 DOA: 05/19/2021 PCP: [name], MD  Status:  Remains inpatient appropriate because: Unsafe d/c plan, IV treatments appropriate due to intensity of illness or inability to take PO, and Inpatient level of care appropriate due to severity of illness  Barriers to discharge:  Son now stating it was his understanding that the patient would remain on hemodialysis until transition to residential hospice.  He states that other family members need to come in and out of town as well as other family members need to be included in the decision-making process.  He reports that on 10/18 when he spoke to his mother she stated that she did not want to die and wanted to continue hemodialysis-he reported that since she is stating this he cannot go against her wishes and "play God".   Level of care:  Med-Surg  Code Status: DNR Family communication: Spoke with patient's son by telephone on 10/19.   DVT prophylaxis: Eliquis COVID vaccination status: [name] 02/26/2020, Pfizer 04/10/2021    HPI: 75 y.o. 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 Patient presented to the ED on 7/25 with dark stool.  On presentation, hemoglobin was 9.6; GI was consulted.  She underwent EGD which was normal and flexible sigmoidoscopy which showed nonbleeding distal rectal ulcers/stercoral ulcers.  Subsequently, GI signed off.  Neurology was also consulted for altered mental status; MRI brain was similar to prior MRI at [facility] with a large right MCA stroke.  EEG this admission unremarkable. Nephrology has been following for dialysis needs.  PEG tube was desired by family but could not be placed because of overlying transverse colon.  General surgery was consulted for G-tube placement but patient does not want a G-tube and  was deemed to have capacity by psych team.  Subsequently after further more detailed evaluations and observation it was finally clarified that she lacked capacity.  Surgical team was consulted the fact that the patient lacks underlying dysphagia and even though she lacks capacity continues to express a desire to not have a gastrostomy tube placed they are reluctant to proceed with surgery and have ordered follow-up SLP evaluation regarding dysphagia.  As of 74/1 son still conflicted over whether or not to place GT and will use the next few days to discuss with other family members who will help him come to a decision.  10/10 Due to acute AMS CT of the head was obtained that revealed possible changes concerning for acute versus subacute stroke.  MRI showed similar findings.  Reviewed by stroke neurologist who was concerned that changes were more consistent with seizure etiology.  LTM EEG consistent with metabolic encephalopathy and stroke physiology  As of 10/12 son updated on above findings and worsening clinical status of his mother.  Plan is to no longer attempt to pursue feeding tube and instead focus on hospice care.  Son is visiting local hospice facilities before making a decision about disposition.  On 10/17 hospice liaison contact the son since he is having difficulty with decision making regarding residential hospice but was unable to directly speak with him.    10/19 updated son regarding declining status now that she has been off dialysis for 1 week.  Son reported that he did not realize this and had not agreed to this and stated he would not make such a medical decision without consulting patient's goddaughter [name] who is a Marine scientist.  Extensive conversation held with son and [name]; at this time decision has been made to  resume hemodialysis.  Nephrology reconsulted and plans are to begin hemodialysis treatments on 10/20.  Of note on 10/19 son told me he would be at the hospital "around 3 PM" I waited on unit 32M from 2:30 PM to 3:30 PM and the son did not show up nor call the unit to state he was not coming.    Subjective: Patient examined during dialysis.  She was minimally responsive and having twitching of face and eyes.  At time of my evaluation TDC catheter was clotted and they were working to open back up.  Objective: Vitals:   08/13/21 2003 08/14/21 0410  BP: (!) 152/74 117/75  Pulse: (!) 101 96  Resp: 16 16  Temp: 98.4 F (36.9 C) 98.1 F (36.7 C)  SpO2: 100% 98%    Intake/Output Summary (Last 24 hours) at 08/14/2021 0824 Last data filed at 08/14/2021 0200 Gross per 24 hour  Intake 60 ml  Output 0 ml  Net 60 ml    Filed Weights   08/01/21 0814 08/01/21 1220 08/06/21 2040  Weight: 68 kg 65 kg 96.2 kg    Exam:  Constitutional: Minimally responsive Respiratory: RA, lungs clear to auscultation without increased work of breathing.  Pulse oximetry percent Cardiovascular: S1-S2, pulse regular and tachycardic, stable bilateral UE edema R>L Abdomen: LBM 10/17, abdomen soft nontender nondistended.  Bowel sounds + Neurologic: CN 2-12 grossly intact on visual inspection-sensation remains intact, no spontaneous movement and continues with bilateral upper extremity hypertonicity Psychiatric: Minimally responsive, attempted to open eyes but was noted with bilateral eye twitching as well as facial twitching.  Assessment/Plan: Acute problems: Sepsis physiology/RLL HCAP/Abn blood cxs c/w contaminant Completed 7 days of Maxipime-current white count normal as of 10/19  Failure to thrive with generalized deconditioning in context of neurogenic dysphagia Given acute stroke patient is no longer a candidate for open G-tube placement.  Her son agrees  Patient more awake and conversant on 10/18 but on  10/19 she opened her eyes to tactile stimulation and voice but did not make eye contact and vision was focused towards the ceiling Patient currently inconsistently taking medications and is refusing food offered by staff.  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  Given recent stroke she is not a candidate for operative placement/open G-tube  Suspected dementia/recent delirium Continue Remeron  ESRD on HD Has transitioned to comfort care/hospice  care have informed nephrology that dialysis has been discontinued As of 10/19 after extensive discussion with son [name] as well as goddaughter [name] in [place] request has been made to resume hemodialysis while family discusses end-of-life issues. Son asked his mother when she was more alert on 10/18 if she wanted him to stop hemodialysis and she told him no because she did not want to die. Nephrology has resumed hemodialysis as of 10/20  Type 2 diabetes  mellitus Episodes of hypoglycemia Hemoglobin A1c 5.1.  Continue CBG checks every 4 hours and provide SSI and/or dextrose boluses Continue Neurontin for peripheral neuropathy  Chronic thrombus left upper extremity Continue Eliquis  Stage II mid coccyx pressure injury, present on admission CT pelvis 10/10 without evidence of abscess Continue local wound care        Data Reviewed:  CBC: Recent Labs  Lab 08/13/21 1151  WBC 10.5  NEUTROABS 9.2*  HGB 8.9*  HCT 29.5*  MCV 92.8  PLT 280     CBG: Recent Labs  Lab 08/13/21 2004 08/13/21 2358 08/14/21 0043 08/14/21 0243 08/14/21 0408  GLUCAP 91 70 73 77 79     Scheduled Meds:  apixaban  5 mg Oral BID   Chlorhexidine Gluconate Cloth  6 each Topical Q0600   [START ON 08/15/2021] darbepoetin (ARANESP) injection - DIALYSIS  200 mcg Intravenous Q Fri-HD   feeding supplement  237 mL Oral BID BM   gabapentin  100 mg Oral Q12H   insulin aspart  0-6 Units Subcutaneous Q4H   mirtazapine  7.5 mg Oral QHS   OLANZapine zydis  2.5 mg Oral QHS   sodium chloride flush  10-40 mL Intracatheter Q12H   venlafaxine  37.5 mg Oral BID     Active Problems:   Essential hypertension   Type 2 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, with long-term current use of insulin (HCC)   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   ESRD on dialysis ([place])   Dyslipidemia   Stage 2 skin ulcer of sacral region ([place])   Cerebral thrombosis with cerebral infarction   Protein-calorie malnutrition, severe   Acute GI bleeding   Failure to thrive in adult   Rectal bleeding   Delirium due to another medical condition   Neurogenic dysphagia   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

## 2021-08-14 NOTE — Progress Notes (Signed)
[facility] Progress Note   Subjective:   Seen prior to HD. Cath flo dwelling in [employer]. Pt opens eyes to voice, appears slightly restless. When asked if she is cold she says "uh huh" but doesn't answer any other ROS questions today.   Objective Vitals:   08/13/21 0503 08/13/21 0944 08/13/21 2003 08/14/21 0410  BP: (!) 91/47 (!) 94/49 (!) 152/74 117/75  Pulse: 98 100 (!) 101 96  Resp: '18  16 16  '$ Temp: 98.6 F (37 C)  98.4 F (36.9 C) 98.1 F (36.7 C)  TempSrc: Axillary   Oral  SpO2: 94%  100% 98%  Weight:      Height:       Physical Exam General: Ill appearing female in NAD Heart: Regular rhythm, slightly tachycardic. 2/6 systolc murmur Lungs: CTA anteriorly Abdomen: Soft, non-distended, +BS, no gaurding Extremities: No edema b/l lower extremities Dialysis Access: [facility] dressing clean appearing  Additional Objective Labs: Basic Metabolic Panel: Recent Labs  Lab 08/13/21 1151  NA 136  K 5.0  CL 101  CO2 26  GLUCOSE 135*  BUN 39*  CREATININE 7.41*  CALCIUM 8.8*  PHOS 4.3   Liver Function Tests: Recent Labs  Lab 08/13/21 1151  ALBUMIN 1.7*    CBC: Recent Labs  Lab 08/13/21 1151  WBC 10.5  NEUTROABS 9.2*  HGB 8.9*  HCT 29.5*  MCV 92.8  PLT 280   Blood Culture    Component Value Date/Time   SDES BLOOD RIGHT HAND 08/04/2021 0848   SPECREQUEST  08/04/2021 0848    BOTTLES DRAWN AEROBIC AND ANAEROBIC Blood Culture adequate volume   CULT  08/04/2021 0848    NO GROWTH 5 DAYS Performed at [facility], [place] [address] [place], [place] [address]    REPTSTATUS 08/09/2021 FINAL 08/04/2021 0848    Cardiac Enzymes: No results for input(s): CKTOTAL, CKMB, CKMBINDEX, TROPONINI in the last 168 hours. CBG: Recent Labs  Lab 08/13/21 2004 08/13/21 2358 08/14/21 0043 08/14/21 0243 08/14/21 0408  GLUCAP 91 70 73 77 79   Iron Studies: No results for input(s): IRON, TIBC, TRANSFERRIN, FERRITIN in the last 72  hours. '@lablastinr3'$ @ Studies/Results: No results found. Medications:   alteplase       apixaban  5 mg Oral BID   Chlorhexidine Gluconate Cloth  6 each Topical Q0600   Chlorhexidine Gluconate Cloth  6 each Topical Q0600   feeding supplement  237 mL Oral BID BM   gabapentin  100 mg Oral Q12H   insulin aspart  0-6 Units Subcutaneous Q4H   mirtazapine  7.5 mg Oral QHS   OLANZapine zydis  2.5 mg Oral QHS   sodium chloride flush  10-40 mL Intracatheter Q12H   venlafaxine  37.5 mg Oral BID    Dialysis Orders: AF MWF  3h 69mn 400/500 62.5kg 3K/2.5 Ca P2 AVG HeRO   Hep none - Mircera 50 q 2 wks  (7/13)  Venofer 50 q wk - Parsabiv '5mg'$  [facility]  Assessment/Plan: AMS/FTT: Mental status that waxes/wanes. She appears very tired and weak. Family had decided on comfort measures and has now decided they would like her to continue HD. Melena/Rectal bleeding - GI consulted. Hx rectal ulcers.  EGD 7/27 normal, flex sig with mucosal ulceration c/w sterocoral ulcers, non bleeding. Hgb 8s---dropped to 6.5 on 10/10. S/p prbcs on 10/10. Hgb now stable at 8.9. Will resume ESA q Friday.  Leukocytosis -- ?Pneumonia/?Bacteremia: On IV abx - per primary. Blood  cultures +S. Epidermidis. Resolved. ESRD: Her last dialysis was 08/04/21. Labs not too bad. TDC requiring cathflo, likely because it has not been accessed in over a week. Hopefully will run well after cathflo, otherwise will need TDC exchange. Plan to resume MWF schedule tomorrow.  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 AVG clotted 06/27/21, unable to be declotted d/t arm contracture. S/P conversion to tunneled HD catheter 07/02/21.  Hypertension/volume: BP variable, was on midodrine prior to comfort measures being initiated, will restart with dialysis if BP drops Anemia of ESRD:  continue Aranesp 285mg q Fri. 1 unit prbcs on 08/04/21. Will resume ESA if needed. MBD:  Not on a binder. Needs updated labs Nutrition: Alb very low. Family decided against feeding tube. If she is unable to eat she will not tolerate dialysis for very long.  Recent CVA with no evidence of meaningful recovery Psych eval from 9/14 determined that patient has functional mental capacity for medical decision-making. Psych re-consulted 9/23 to evaluate the patient again - at this point, patient does not have the capacity to make medical decisions. Ongoing discussions with patient's son continues Dispo: DNR/DNI. Poor prognosis. Family was interested in hospice and patient was on comfort measures, has now changed their mind. Patient will need to be strong enough to tolerate dialysis in a recliner before she can be discharged to outpatient.   SAnice Paganini PA-C 08/14/2021, 8:05 AM  [facility] Pager: [phone]

## 2021-08-14 NOTE — Progress Notes (Signed)
Patient is not able to take her medications she is sleepy and lethargic. NP aware.

## 2021-08-14 NOTE — Telephone Encounter (Signed)
I attempted to leave message for patient to call back and schedule Medicare Annual Wellness Visit (AWV) .  Voice mail was full.  Please offer to do virtually or by telephone.  Left office number and my jabber [phone].  Last AWV:05/02/2020  Please schedule at anytime with Nurse Health Advisor.

## 2021-08-15 DIAGNOSIS — R1319 Other dysphagia: Secondary | ICD-10-CM | POA: Diagnosis not present

## 2021-08-15 DIAGNOSIS — N186 End stage renal disease: Secondary | ICD-10-CM | POA: Diagnosis not present

## 2021-08-15 DIAGNOSIS — R627 Adult failure to thrive: Secondary | ICD-10-CM | POA: Diagnosis not present

## 2021-08-15 DIAGNOSIS — E43 Unspecified severe protein-calorie malnutrition: Secondary | ICD-10-CM | POA: Diagnosis not present

## 2021-08-15 LAB — GLUCOSE, CAPILLARY
Glucose-Capillary: 106 mg/dL — ABNORMAL HIGH (ref 70–99)
Glucose-Capillary: 70 mg/dL (ref 70–99)
Glucose-Capillary: 71 mg/dL (ref 70–99)
Glucose-Capillary: 73 mg/dL (ref 70–99)
Glucose-Capillary: 83 mg/dL (ref 70–99)
Glucose-Capillary: 92 mg/dL (ref 70–99)

## 2021-08-15 LAB — RENAL FUNCTION PANEL
Albumin: 1.7 g/dL — ABNORMAL LOW (ref 3.5–5.0)
Anion gap: 8 (ref 5–15)
BUN: 18 mg/dL (ref 8–23)
CO2: 26 mmol/L (ref 22–32)
Calcium: 8.3 mg/dL — ABNORMAL LOW (ref 8.9–10.3)
Chloride: 102 mmol/L (ref 98–111)
Creatinine, Ser: 4.51 mg/dL — ABNORMAL HIGH (ref 0.44–1.00)
GFR, Estimated: 10 mL/min — ABNORMAL LOW (ref >60)
Glucose, Bld: 64 mg/dL — ABNORMAL LOW (ref 70–99)
Phosphorus: 3 mg/dL (ref 2.5–4.6)
Potassium: 4.1 mmol/L (ref 3.5–5.1)
Sodium: 136 mmol/L (ref 135–145)

## 2021-08-15 LAB — CBC
HCT: 31.4 % — ABNORMAL LOW (ref 36.0–46.0)
Hemoglobin: 9.4 g/dL — ABNORMAL LOW (ref 12.0–15.0)
MCH: 28.3 pg (ref 26.0–34.0)
MCHC: 29.9 g/dL — ABNORMAL LOW (ref 30.0–36.0)
MCV: 94.6 fL (ref 80.0–100.0)
Platelets: 302 10*3/uL (ref 150–400)
RBC: 3.32 MIL/uL — ABNORMAL LOW (ref 3.87–5.11)
RDW: 20.6 % — ABNORMAL HIGH (ref 11.5–15.5)
WBC: 17.1 10*3/uL — ABNORMAL HIGH (ref 4.0–10.5)
nRBC: 0 % (ref 0.0–0.2)

## 2021-08-15 LAB — HEPATITIS B SURFACE ANTIGEN: Hepatitis B Surface Ag: NONREACTIVE

## 2021-08-15 NOTE — Progress Notes (Signed)
Pt continuing to decline. Pt having slow shallow respirations as low as 9/per minute, increasingly more confused than usual. PT had a difficult time in dialysis maintaining blood pressure and had to be given 200 ml bolus. 1000 ML goal was not reached in dialysis. MD Karleen Hampshire notified, NP ellis at bedside. No new orders , will continue to monitor.

## 2021-08-15 NOTE — Progress Notes (Signed)
Patient with unrestricted visitation, family members at bedside.  Aurther Loft, RN

## 2021-08-15 NOTE — Progress Notes (Signed)
100 normal saline given. UF off.

## 2021-08-15 NOTE — Progress Notes (Signed)
Decrease in bp noted.

## 2021-08-15 NOTE — Progress Notes (Signed)
Verbal Order from NP to stop CBG checks. Will continue to monitor.  Aurther Loft, RN

## 2021-08-15 NOTE — Progress Notes (Signed)
100 normal saline bolus given for bp.

## 2021-08-15 NOTE — Progress Notes (Addendum)
   08/15/21 1308  Assess: MEWS Score  Temp 98.6 F (37 C)  BP (!) 143/63  Pulse Rate (!) 111  Resp (!) 9  SpO2 100 %  O2 Device Nasal Cannula  O2 Flow Rate (L/min) 2 L/min  Assess: MEWS Score  MEWS Temp 0  MEWS Systolic 0  MEWS Pulse 2  MEWS RR 1  MEWS LOC 0  MEWS Score 3  MEWS Score Color Comfort Care Only  Assess: if the MEWS score is Yellow or Red  Were vital signs taken at a resting state? Yes  Focused Assessment Change from prior assessment (see assessment flowsheet)  Early Detection of Sepsis Score *See Row Information* High  MEWS guidelines implemented *See Row Information* Yes  Treat  MEWS Interventions Escalated (See documentation below)  Take Vital Signs  Increase Vital Sign Frequency  Yellow: Q 2hr X 2 then Q 4hr X 2, if remains yellow, continue Q 4hrs  Escalate  MEWS: Escalate Yellow: discuss with charge nurse/RN and consider discussing with provider and RRT  Notify: Charge Nurse/RN  Name of Charge Nurse/RN Notified Ren  Date Charge Nurse/RN Notified 08/15/21  Time Charge Nurse/RN Notified 4536  Notify: Provider  Provider Name/Title Ebony Hail, NP  Date Provider Notified 08/15/21  Time Provider Notified 1318  Notification Type Page  Notification Reason Other (Comment) (Respirations 9)  Provider response En route;At bedside  Date of Provider Response 08/15/21  Time of Provider Response 1320  Document  Patient Outcome Other (Comment) (will continue to monitor)

## 2021-08-15 NOTE — Progress Notes (Signed)
Patient declining, respirations 7.  NP notified, family member at bedside. Will continue to monitor.  Aurther Loft, RN

## 2021-08-15 NOTE — Progress Notes (Signed)
Bp improved uf turned back on.

## 2021-08-15 NOTE — Progress Notes (Addendum)
Pt unable to take PO meds due to being very lethargic. Will continue to monitor.  Aurther Loft, RN

## 2021-08-15 NOTE — Progress Notes (Signed)
[facility] Progress Note   Subjective:   Pt seen on HD today. Tolerated abbreviated treatment yesterday. Still not taking many meds PO. Pt is awake but oriented x1, repeats "I'm Amy [name]."  Objective Vitals:   08/15/21 1000 08/15/21 1001 08/15/21 1015 08/15/21 1030  BP: (!) 84/42 (!) 82/39 (!) 88/43 (!) 120/52  Pulse: (!) 102 (!) 102 87 85  Resp:    14  Temp:      TempSrc:      SpO2:    100%  Weight:      Height:       Physical Exam General: Elderly, slightly anxious appearing female Heart: Mildly tachycardic, regular rhythm, 2/6 systolic murmur Lungs: CTA anteriorly Abdomen: Soft, non-distended, +BS Extremities: No edema b/l lower extremities Dialysis Access:  [facility] accessed  Additional Objective Labs: Basic Metabolic Panel: Recent Labs  Lab 08/13/21 1151 08/15/21 0428  NA 136 136  K 5.0 4.1  CL 101 102  CO2 26 26  GLUCOSE 135* 64*  BUN 39* 18  CREATININE 7.41* 4.51*  CALCIUM 8.8* 8.3*  PHOS 4.3 3.0   Liver Function Tests: Recent Labs  Lab 08/13/21 1151 08/15/21 0428  ALBUMIN 1.7* 1.7*   No results for input(s): LIPASE, AMYLASE in the last 168 hours. CBC: Recent Labs  Lab 08/13/21 1151 08/15/21 0841  WBC 10.5 17.1*  NEUTROABS 9.2*  --   HGB 8.9* 9.4*  HCT 29.5* 31.4*  MCV 92.8 94.6  PLT 280 302   Blood Culture    Component Value Date/Time   SDES BLOOD RIGHT HAND 08/04/2021 0848   SPECREQUEST  08/04/2021 0848    BOTTLES DRAWN AEROBIC AND ANAEROBIC Blood Culture adequate volume   CULT  08/04/2021 0848    NO GROWTH 5 DAYS Performed at [facility], [place] [address], [place], [place] [address]    REPTSTATUS 08/09/2021 FINAL 08/04/2021 0848    Cardiac Enzymes: No results for input(s): CKTOTAL, CKMB, CKMBINDEX, TROPONINI in the last 168 hours. CBG: Recent Labs  Lab 08/14/21 2047 08/15/21 0006 08/15/21 0423 08/15/21 0633 08/15/21 1100  GLUCAP 75 73 70 71 83   Iron Studies: No results for input(s): IRON, TIBC,  TRANSFERRIN, FERRITIN in the last 72 hours. @lablastinr3 @ Studies/Results: No results found. Medications:   apixaban  5 mg Oral BID   Chlorhexidine Gluconate Cloth  6 each Topical Q0600   Chlorhexidine Gluconate Cloth  6 each Topical Q0600   darbepoetin (ARANESP) injection - DIALYSIS  200 mcg Intravenous Q Fri-HD   feeding supplement  237 mL Oral BID BM   gabapentin  100 mg Oral Q12H   insulin aspart  0-6 Units Subcutaneous Q4H   mirtazapine  7.5 mg Oral QHS   OLANZapine zydis  2.5 mg Oral QHS   sodium chloride flush  10-40 mL Intracatheter Q12H   venlafaxine  37.5 mg Oral BID    Dialysis Orders: AF MWF  3h 71min 400/500 62.5kg 3K/2.5 Ca P2 AVG HeRO   Hep none - Mircera 50 q 2 wks  (7/13)  Venofer 50 q wk - Parsabiv 5mg  IV q HD - not on [facility] formulary  Assessment/Plan: AMS/FTT: Mental status that waxes/wanes. She appears very tired and weak. Family had decided on comfort measures and has now decided they would like her to continue HD. Melena/Rectal bleeding - GI consulted. Hx rectal ulcers.  EGD 7/27 normal, flex sig with mucosal ulceration c/w sterocoral ulcers, non bleeding. Hgb 8s---dropped to 6.5 on 10/10. S/p prbcs on 10/10. Hgb now  stable at 9.4. Will resume ESA q Friday.  Leukocytosis -- ?Pneumonia/?Bacteremia: On IV abx - per primary. Blood cultures +S. Epidermidis. Resolved. ESRD: Her last dialysis was 08/04/21, resumed 10/20 per family's wishes. Tolerating HD so far today, continue MWF schedule. 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 AVG clotted 06/27/21, unable to be declotted d/t arm contracture. S/P conversion to tunneled HD catheter 07/02/21.  Hypertension/volume: BP variable, was on midodrine prior to comfort measures being initiated but now having trouble taking PO meds Anemia of ESRD:  continue Aranesp 233mcg q Fri. 1 unit prbcs on 08/04/21.  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 Needs updated labs Nutrition: Alb very  low. Not a candidate for feeding tube. If she is unable to eat she will not tolerate dialysis for very long.  Recent CVA with no evidence of meaningful recovery Psych eval from 9/14 determined that patient has functional mental capacity for medical decision-making. Psych re-consulted 9/23 to evaluate the patient again - at this point, patient does not have the capacity to make medical decisions. Ongoing discussions with patient's son continues Dispo: DNR/DNI. Poor prognosis. Family was interested in hospice and patient was on comfort measures, has now changed their mind. Patient will need to be strong enough to tolerate dialysis in a recliner before she can be discharged to outpatient.  Anice Paganini, PA-C 08/15/2021, 11:15 AM  [facility] Pager: [phone]

## 2021-08-15 NOTE — Progress Notes (Signed)
[facility] PROGRESS NOTE  Amy [name] BTD:[id number] DOB: [birth year] DOA: 05/19/2021 PCP: [name], MD  Status:  Remains inpatient appropriate because: Unsafe d/c plan, IV treatments appropriate due to intensity of illness or inability to take PO, and Inpatient level of care appropriate due to severity of illness  Barriers to discharge:  Son now stating it was his understanding that the patient would remain on hemodialysis until transition to residential hospice.  He states that other family members need to come in and out of town as well as other family members need to be included in the decision-making process.  He reports that on 10/18 when he spoke to his mother she stated that she did not want to die and wanted to continue hemodialysis-he reported that since she is stating this he cannot go against her wishes and "play God".   Level of care:  Med-Surg  Code Status: DNR Family communication: Spoke with patient's son by telephone on 10/19.   DVT prophylaxis: Eliquis COVID vaccination status: [name] 02/26/2020, Pfizer 04/10/2021    HPI: 75 y.o. 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 Patient presented to the ED on 7/25 with dark stool.  On presentation, hemoglobin was 9.6; GI was consulted.  She underwent EGD which was normal and flexible sigmoidoscopy which showed nonbleeding distal rectal ulcers/stercoral ulcers.  Subsequently, GI signed off.  Neurology was also consulted for altered mental status; MRI brain was similar to prior MRI at [facility] with a large right MCA stroke.  EEG this admission unremarkable. Nephrology has been following for dialysis needs.  PEG tube was desired by family but could not be placed because of overlying transverse colon.  General surgery was consulted for G-tube placement but patient does not want a G-tube and  was deemed to have capacity by psych team.  Subsequently after further more detailed evaluations and observation it was finally clarified that she lacked capacity.  Surgical team was consulted the fact that the patient lacks underlying dysphagia and even though she lacks capacity continues to express a desire to not have a gastrostomy tube placed they are reluctant to proceed with surgery and have ordered follow-up SLP evaluation regarding dysphagia.  As of 10/6 son still conflicted over whether or not to place GT and will use the next few days to discuss with other family members who will help him come to a decision.  10/10 Due to acute AMS CT of the head was obtained that revealed possible changes concerning for acute versus subacute stroke.  MRI showed similar findings.  Reviewed by stroke neurologist who was concerned that changes were more consistent with seizure etiology.  LTM EEG consistent with metabolic encephalopathy and stroke physiology  As of 10/12 son updated on above findings and worsening clinical status of his mother.  Plan is to no longer attempt to pursue feeding tube and instead focus on hospice care.  Son is visiting local hospice facilities before making a decision about disposition.  On 10/17 hospice liaison contact the son since he is having difficulty with decision making regarding residential hospice but was unable to directly speak with him.    10/19 updated son regarding declining status now that she has been off dialysis for 1 week.  Son reported that he did not realize this and had not agreed to this and stated he would not make such a medical decision without consulting patient's goddaughter Amy [name] who is a Marine scientist.  Extensive conversation held with son and Amy [name]; at this time decision has been made to  resume hemodialysis.  Nephrology reconsulted and plans are to begin hemodialysis treatments on 10/20.  Of note on 10/19 son told me he would be at the hospital "around 3 PM" I waited on unit 52M from 2:30 PM to 3:30 PM and the son did not show up nor call the unit to state he was not coming.  1400 after patient returned from dialysis was made aware of abnormal respiratory pattern that appeared consistent with Cheyne-Stokes breathing.  Upon my examination of the patient she had intermittent labored respiratory effort as well as a pattern of Cheyne-Stokes.  She was hallucinating.  Speech was somewhat incoherent.  Son Amy [name] updated about change in status and informed to gather the family since given these changes patient could be dying soon.  He stated he would come to the hospital notify other family members.    Subjective: Initially examined in dialysis where she was somewhat dysarthric but occasionally with state "help me"  Objective: Vitals:   08/15/21 0835 08/15/21 0900  BP: (!) 156/73 (!) 94/53  Pulse: (!) 113 (!) 114  Resp:  20  Temp:    SpO2:      Intake/Output Summary (Last 24 hours) at 08/15/2021 0919 Last data filed at 08/15/2021 0800 Gross per 24 hour  Intake 100 ml  Output 0 ml  Net 100 ml    Filed Weights   08/06/21 2040 08/15/21 0802  Weight: 96.2 kg 80.3 kg    Exam:  Constitutional: Minimally responsive in dialysis with worsening of mentation after arrival back to 5 mm postdialysis Respiratory: Currently on 2 L oxygen, respiratory pattern and effort normal while in dialysis upon initial evaluation earlier this morning but subsequently she has developed Cheyne-Stokes respiratory pattern with coarse almost wet sounding lung sounds, oximetry 100% Cardiovascular: S1-S2, pulse regular and tachycardic, stable bilateral UE edema R>L; BP prior to dialysis 94/53.  BP did drop in dialysis to 84/42 and after arrival from dialysis BP was 151/56 with a heart rate of 110 Abdomen:  LBM 10/17, abdomen soft nontender nondistended.  Bowel sounds + still not eating Neurologic: CN 2-12 grossly intact on visual inspection-sensation remains intact, no spontaneous movement and continues with bilateral upper extremity hypertonicity Psychiatric: Minimally responsive, opens eyes, attempted to speak but essentially dysarthric and later in the day nonsensical responses and was hallucinating  Assessment/Plan: Acute problems: Sepsis physiology/RLL HCAP/Abn blood cxs c/w contaminant Completed 7 days of Maxipime-current white count normal as of 10/19  Failure to thrive with generalized deconditioning in context of neurogenic dysphagia Given acute stroke patient is no longer a candidate for open G-tube placement.  Her son agrees  Patient more awake and conversant on 10/18 but on 10/19 she opened her eyes to tactile stimulation and voice but did not make eye contact and vision was focused towards the ceiling See above regarding change in status and notification of family on the afternoon of 10/21 Patient currently inconsistently taking medications and is refusing food offered by staff.  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  Given recent stroke she is not a candidate for operative placement/open G-tube  Suspected dementia/recent delirium Continue Remeron-patient has not been taking medications offered  ESRD on HD Has transitioned to comfort care/hospice care have informed nephrology that dialysis has been discontinued As of 10/19 after extensive discussion with son Amy [name] as well as goddaughter Amy [name] in [place] request has been made to resume hemodialysis while family discusses end-of-life issues. Son asked his mother when she was more alert on 10/18 if she wanted him to stop hemodialysis and she told him no because she did not want to die. Nephrology has resumed hemodialysis as of 10/20-incomplete treatment on 10/20 due to issues with tunneled dialysis catheter patency.  Additional dialysis completed on 10/21  Type 2 diabetes mellitus Episodes of hypoglycemia Hemoglobin A1c 5.1.  Continue CBG checks every 4 hours and provide SSI and/or dextrose boluses Continue Neurontin for peripheral neuropathy  Chronic thrombus left upper extremity Continue Eliquis  Stage II mid coccyx pressure injury, present on admission CT pelvis 10/10 without evidence of abscess Continue local wound care        Data Reviewed:  CBC: Recent Labs  Lab 08/13/21 1151  WBC 10.5  NEUTROABS 9.2*  HGB 8.9*  HCT 29.5*  MCV 92.8  PLT 280     CBG: Recent Labs  Lab 08/14/21 1545 08/14/21 2047 08/15/21 0006 08/15/21 0423 08/15/21 0633  GLUCAP 80 75 73 70 71     Scheduled Meds:  apixaban  5 mg Oral BID   Chlorhexidine Gluconate Cloth  6 each  Topical Q0600   Chlorhexidine Gluconate Cloth  6 each Topical Q0600   darbepoetin (ARANESP) injection - DIALYSIS  200 mcg Intravenous Q Fri-HD   feeding supplement  237 mL Oral BID BM   gabapentin  100 mg Oral Q12H   insulin aspart  0-6 Units Subcutaneous Q4H   mirtazapine  7.5 mg Oral QHS   OLANZapine zydis  2.5 mg Oral QHS   sodium chloride flush  10-40 mL Intracatheter Q12H   venlafaxine  37.5 mg Oral BID     Active Problems:   Essential hypertension   Type 2 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, with long-term current use of insulin (HCC)   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   ESRD on dialysis ([place])   Dyslipidemia   Stage 2 skin ulcer of sacral region ([place])   Cerebral thrombosis with cerebral infarction   Protein-calorie malnutrition, severe   Acute GI bleeding   Failure to thrive in adult   Rectal bleeding   Delirium due to another medical condition   Neurogenic dysphagia   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

## 2021-08-15 NOTE — Progress Notes (Signed)
BP rechecked. UF turned off.

## 2021-08-16 DIAGNOSIS — I6330 Cerebral infarction due to thrombosis of unspecified cerebral artery: Secondary | ICD-10-CM | POA: Diagnosis not present

## 2021-08-16 LAB — CBC
HCT: 30 % — ABNORMAL LOW (ref 36.0–46.0)
Hemoglobin: 8.9 g/dL — ABNORMAL LOW (ref 12.0–15.0)
MCH: 27.7 pg (ref 26.0–34.0)
MCHC: 29.7 g/dL — ABNORMAL LOW (ref 30.0–36.0)
MCV: 93.5 fL (ref 80.0–100.0)
Platelets: 277 K/uL (ref 150–400)
RBC: 3.21 MIL/uL — ABNORMAL LOW (ref 3.87–5.11)
RDW: 19.8 % — ABNORMAL HIGH (ref 11.5–15.5)
WBC: 28 K/uL — ABNORMAL HIGH (ref 4.0–10.5)
nRBC: 0 % (ref 0.0–0.2)

## 2021-08-16 LAB — GLUCOSE, CAPILLARY
Glucose-Capillary: 89 mg/dL (ref 70–99)
Glucose-Capillary: 87 mg/dL (ref 70–99)
Glucose-Capillary: 82 mg/dL (ref 70–99)
Glucose-Capillary: 73 mg/dL (ref 70–99)

## 2021-08-16 NOTE — Progress Notes (Signed)
[facility] PROGRESS NOTE  Amy [name] PPI:[id number] DOB: Aug 31, 1946 DOA: 05/19/2021 PCP: [name], MD  Status:  Remains inpatient appropriate because: Unsafe d/c plan, IV treatments appropriate due to intensity of illness or inability to take PO, and Inpatient level of care appropriate due to severity of illness  Barriers to discharge:  Son now stating it was his understanding that the patient would remain on hemodialysis until transition to residential hospice.  He states that other family members need to come in and out of town as well as other family members need to be included in the decision-making process.  He reports that on 10/18 when he spoke to his mother she stated that she did not want to die and wanted to continue hemodialysis-he reported that since she is stating this he cannot go against her wishes and "play God". Discussed with son on the evening of 10/21, pts; son would like to wait till tomorrow to decide regarding comfort measures vs continue to do HD.    Level of care:  Med-Surg  Code Status: DNR Family communication: Spoke with patient's son by telephone on 10/19.   DVT prophylaxis: Eliquis COVID vaccination status: Amy [name] 02/26/2020, Pfizer 04/10/2021    HPI: 75 y.o. 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 Patient presented to the ED on 7/25 with dark stool.  On presentation, hemoglobin was 9.6; GI was consulted.  She underwent EGD which was normal and flexible sigmoidoscopy which showed nonbleeding distal rectal ulcers/stercoral ulcers.  Subsequently, GI signed off.  Neurology was also consulted for altered mental status; MRI brain  was similar to prior MRI at [facility] with a large right MCA stroke.  EEG this admission unremarkable. Nephrology has been following for dialysis needs. PEG tube was desired by family but could not be placed because of overlying transverse colon.  General surgery was consulted for G-tube placement but patient does not want a G-tube and  was deemed to have capacity by psych team.  Subsequently after further more detailed evaluations and observation it was finally clarified that she lacked capacity.  Surgical team was consulted the fact that the patient lacks underlying dysphagia and even though she lacks capacity continues to express a desire to not have a gastrostomy tube placed they are reluctant to proceed with surgery and have ordered follow-up SLP evaluation regarding dysphagia.  As of 06/3 son still conflicted over whether or not to place GT and will use the next few days to discuss with other family members who will help him come to a decision.  10/10 Due to acute AMS CT of the head was obtained that revealed possible changes concerning for acute versus subacute stroke.  MRI showed similar findings.  Reviewed by stroke neurologist who was concerned that changes were more consistent with seizure etiology.  LTM EEG consistent with metabolic encephalopathy and stroke physiology  As of 10/12 son updated on above findings and worsening clinical status of his mother.  Plan is to no longer attempt to pursue feeding tube and instead focus on hospice care.  Son is visiting local hospice facilities before making a decision about disposition.  On 10/17 hospice liaison contact the son since he is having difficulty with decision making regarding residential hospice but  was unable to directly speak with him.    10/19 updated son regarding declining status now that she has been off dialysis for 1 week.  Son reported that he did not realize this and had not agreed to this and stated he would not make such a medical decision  without consulting patient's goddaughter Amy [name] who is a [employer] scientist.  Extensive conversation held with son and Amy [name]; at this time decision has been made to resume hemodialysis.  Nephrology reconsulted and plans are to begin hemodialysis treatments on 10/20.  Of note on 10/19 son told me he would be at the hospital "around 3 PM" I waited on unit 59M from 2:30 PM to 3:30 PM and the son did not show up nor call the unit to state he was not coming.  1400 after patient returned from dialysis was made aware of abnormal respiratory pattern that appeared consistent with Cheyne-Stokes breathing.  Upon my examination of the patient she had intermittent labored respiratory effort as well as a pattern of Cheyne-Stokes.  She was hallucinating.  Speech was somewhat incoherent.  Son Amy [name] updated about change in status and informed to gather the family since given these changes patient could be dying soon.  He stated he would come to the hospital notify other family members.  10/21 pt seen , she remains lethargic, no change from yesterday. She barely opening eyes to verbal cues. She appears comfortable.     Subjective: Lethargic.   Objective: Vitals:   08/16/21 0900 08/16/21 1702  BP: (!) 122/50 (!) 116/49  Pulse: (!) 108 (!) 104  Resp: (!) 8 (!) 8  Temp: 99.1 F (37.3 C) 99 F (37.2 C)  SpO2: 100% 99%    Intake/Output Summary (Last 24 hours) at 08/16/2021 1746 Last data filed at 08/16/2021 1245 Gross per 24 hour  Intake 0 ml  Output 0 ml  Net 0 ml     Filed Weights   08/06/21 2040 08/15/21 0802 08/15/21 1219  Weight: 96.2 kg 80.3 kg 79.8 kg    Exam:  General exam: elderly lady, minimally responsive. Appears comfortable.  Respiratory system: Clear to auscultation. Shallow breathing, on [place] oxygen.  Cardiovascular system: S1 & S2 heard, RRR. No JVD,  Gastrointestinal system: Abdomen is soft, non tender Central nervous system: minimally responsive, opens eyes to verbal cues.  Extremities: no pedal  edema.  Skin:  no rashes seen.  Psychiatry: cannot be assessed.    Assessment/Plan: Acute problems: Sepsis physiology/RLL HCAP/Abn blood cxs c/w contaminant Completed 7 days of Maxipime-current white count normal as of 10/19  Failure to thrive with generalized deconditioning in context of neurogenic dysphagia Given acute stroke patient is no longer a candidate for open G-tube placement.  Her son agrees  No changes in her mental status compared to yesterday.  She is not taking anything by mouth today.    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 Unfortunately she isnot taking any meds at this time.   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  Given recent stroke she is not a candidate for operative placement/open G-tube  Suspected dementia/recent delirium Continue Remeron-patient has not been taking medications offered  ESRD on HD Has transitioned to comfort care/hospice care have informed nephrology that dialysis has been discontinued As of 10/19 after extensive discussion with son Amy [name] as well as goddaughter Amy [name] in [place] request has been made  to resume hemodialysis while family discusses end-of-life issues. Son asked his mother when she was more alert on 10/18 if she wanted him to stop hemodialysis and she told him no because she did not want to die. Nephrology has resumed hemodialysis as of 10/20-incomplete treatment on 10/20 due to issues with tunneled dialysis catheter patency.  Additional dialysis completed on 10/21 Further management as per nephrology.   Type 2 diabetes mellitus Episodes of hypoglycemia Hemoglobin A1c 5.1.  Continue CBG checks every 4 hours and provide SSI and/or dextrose boluses Continue Neurontin for peripheral neuropathy  Chronic thrombus left upper extremity Continue Eliquis if she is able to take.   Stage II mid coccyx pressure injury, present on admission CT pelvis 10/10 without evidence of abscess Continue local wound care        Data Reviewed:  CBC: Recent Labs  Lab 08/13/21 1151 08/15/21 0841 08/16/21 0417  WBC 10.5 17.1* 28.0*  NEUTROABS 9.2*  --   --   HGB 8.9* 9.4* 8.9*  HCT 29.5* 31.4* 30.0*  MCV 92.8 94.6 93.5  PLT 280 302 277      CBG: Recent Labs  Lab 08/15/21 2149 08/16/21 0543 08/16/21 0843 08/16/21 1127 08/16/21 1631  GLUCAP 106* 89 87 82 73      Scheduled Meds:  apixaban  5 mg Oral BID   Chlorhexidine Gluconate Cloth  6 each Topical Q0600   darbepoetin (ARANESP) injection - DIALYSIS  200 mcg Intravenous Q Fri-HD   feeding supplement  237 mL Oral BID BM   gabapentin  100 mg Oral Q12H   insulin aspart  0-6 Units Subcutaneous Q4H   mirtazapine  7.5 mg Oral QHS   OLANZapine zydis  2.5 mg Oral QHS   sodium chloride flush  10-40 mL Intracatheter Q12H   venlafaxine  37.5 mg Oral BID     Active Problems:   Essential hypertension   Type 2 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, with long-term current use of insulin (HCC)   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   ESRD on dialysis (HCC)   Dyslipidemia   Stage  2 skin ulcer of sacral region ([place])   Cerebral thrombosis with cerebral infarction   Protein-calorie malnutrition, severe   Acute GI bleeding   Failure to thrive in adult   Rectal bleeding   Delirium due to another medical condition   Neurogenic dysphagia   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 ([place])   HCAP (healthcare-associated pneumonia)   Sepsis due to other etiology ([place])   Recurrent cerebrovascular accidents (CVAs) ([place])   End of life care   Consultants: Vascular surgery Nephrology Gastroenterology Neurology Palliative medicine General surgery  Procedures: EEG Echocardiogram Cortrack EGD  Antibiotics: Ceftriaxone 8/3 through 8/5   Time spent: 25 minutes    Hosie Poisson MD [facility] 7 am - 330 pm/M-F for direct patient care and secure chat Please refer to [facility]  for contact info 88  days

## 2021-08-16 NOTE — Progress Notes (Signed)
[facility] Progress Note   Subjective:    Seen and examined patient at bedside. Currently responding to her name. Appears comfortable. Tolerated HD on 10/20 with net UF 642ml. Plan for HD 10/24.  Objective Vitals:   08/15/21 1322 08/15/21 1545 08/16/21 0541 08/16/21 0900  BP: (!) 151/56  (!) 141/49 (!) 122/50  Pulse: (!) 110  (!) 103 (!) 108  Resp: 14 (!) 7 (!) 8 (!) 8  Temp: 98.6 F (37 C)  99.1 F (37.3 C) 99.1 F (37.3 C)  TempSrc:   Oral Axillary  SpO2: 100%  100% 100%  Weight:      Height:       Physical Exam General: Elderly female; appears comfortable; NAD Heart: Mildly tachycardic, regular rhythm, 2/6 systolic murmur Lungs: CTA anteriorly Abdomen: Soft, non-distended, +BS Extremities: No edema b/l lower extremities Dialysis Access:  [facility] accessed    [place] Weights   08/06/21 2040 08/15/21 0802 08/15/21 1219  Weight: 96.2 kg 80.3 kg 79.8 kg    Intake/Output Summary (Last 24 hours) at 08/16/2021 1615 Last data filed at 08/16/2021 1245 Gross per 24 hour  Intake 0 ml  Output 0 ml  Net 0 ml    Additional Objective Labs: Basic Metabolic Panel: Recent Labs  Lab 08/13/21 1151 08/15/21 0428  NA 136 136  K 5.0 4.1  CL 101 102  CO2 26 26  GLUCOSE 135* 64*  BUN 39* 18  CREATININE 7.41* 4.51*  CALCIUM 8.8* 8.3*  PHOS 4.3 3.0   Liver Function Tests: Recent Labs  Lab 08/13/21 1151 08/15/21 0428  ALBUMIN 1.7* 1.7*   No results for input(s): LIPASE, AMYLASE in the last 168 hours. CBC: Recent Labs  Lab 08/13/21 1151 08/15/21 0841 08/16/21 0417  WBC 10.5 17.1* 28.0*  NEUTROABS 9.2*  --   --   HGB 8.9* 9.4* 8.9*  HCT 29.5* 31.4* 30.0*  MCV 92.8 94.6 93.5  PLT 280 302 277   Blood Culture    Component Value Date/Time   SDES BLOOD RIGHT HAND 08/04/2021 0848   SPECREQUEST  08/04/2021 0848    BOTTLES DRAWN AEROBIC AND ANAEROBIC Blood Culture adequate volume   CULT  08/04/2021 0848    NO GROWTH 5 DAYS Performed at [facility], [place] [address], [place], [place] [address]    REPTSTATUS 08/09/2021 FINAL 08/04/2021 0848    Cardiac Enzymes: No results for input(s): CKTOTAL, CKMB, CKMBINDEX, TROPONINI in the last 168 hours. CBG: Recent Labs  Lab 08/15/21 1307 08/15/21 2149 08/16/21 0543 08/16/21 0843 08/16/21 1127  GLUCAP 92 106* 89 87 82   Iron Studies: No results for input(s): IRON, TIBC, TRANSFERRIN, FERRITIN in the last 72 hours. Lab Results  Component Value Date   INR 0.9 03/10/2021   INR 1.0 01/26/2021   Studies/Results: No results found.  Medications:   apixaban  5 mg Oral BID   Chlorhexidine Gluconate Cloth  6 each Topical Q0600   darbepoetin (ARANESP) injection - DIALYSIS  200 mcg Intravenous Q Fri-HD   feeding supplement  237 mL Oral BID BM   gabapentin  100 mg Oral Q12H   insulin aspart  0-6 Units Subcutaneous Q4H   mirtazapine  7.5 mg Oral QHS   OLANZapine zydis  2.5 mg Oral QHS   sodium chloride flush  10-40 mL Intracatheter Q12H   venlafaxine  37.5 mg Oral BID    Dialysis Orders: AF MWF  3h 68min 400/500 62.5kg 3K/2.5 Ca P2 AVG HeRO   Hep none -  Mircera 50 q 2 wks  (7/13)  Venofer 50 q wk - Parsabiv 5mg  IV q HD - not on [facility] formulary  Assessment/Plan: AMS/FTT: Mental status that waxes/wanes. She appears very tired and weak. Family had decided on comfort measures and has now decided they would like her to continue HD. Melena/Rectal bleeding - GI consulted. Hx rectal ulcers.  EGD 7/27 normal, flex sig with mucosal ulceration c/w sterocoral ulcers, non bleeding. Hgb 8s---dropped to 6.5 on 10/10. S/p prbcs on 10/10. Hgb now 8.9 from 9.4 (10/21). Will resume ESA q Friday. Monitor trends. Leukocytosis -- ?Pneumonia/?Bacteremia: On IV abx - per primary. Blood cultures +S. Epidermidis. Resolved. ESRD: Her last dialysis was 08/04/21, resumed 10/20 per family's wishes. Tolerating HD on 10/20 with net UF 65ml. Plan for HD 10/24. Access Issues/AVG clotted: Had f'gram  6/23 /22 which  showed chronic thrombus and was started on Eliquis. AVG clotted 06/27/21, unable to be declotted d/t arm contracture. S/P conversion to tunneled HD catheter 07/02/21.  Hypertension/volume: BP variable, was on midodrine prior to comfort measures being initiated but now having trouble taking PO meds Anemia of ESRD:  continue Aranesp 214mcg q Fri. 1 unit prbcs on 08/04/21.  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 Needs updated labs-plan to order pre HD on 10/24. Nutrition: Alb very low. Not a candidate for feeding tube. If she is unable to eat she will not tolerate dialysis for very long.  Recent CVA with no evidence of meaningful recovery Psych eval from 9/14 determined that patient has functional mental capacity for medical decision-making. Psych re-consulted 9/23 to evaluate the patient again - at this point, patient does not have the capacity to make medical decisions. Ongoing discussions with patient's son continues Dispo: DNR/DNI. Poor prognosis. Family was interested in hospice and patient was on comfort measures, has now changed their mind. Patient will need to be strong enough to tolerate dialysis in a recliner before she can be discharged to outpatient.  Tobie Poet, NP [facility] 08/16/2021,4:15 PM  LOS: 88 days

## 2021-08-16 NOTE — Progress Notes (Signed)
Pt lethargic but responsive to stimuli. Pt remains on 3L of oxygen via nasal cannula; shallow slow breaths noted during shift about 9 per min. Pt family remains at the bedside. Pt repositioned for comfort and oral care performed. Will continue to monitor.

## 2021-08-17 DIAGNOSIS — I6330 Cerebral infarction due to thrombosis of unspecified cerebral artery: Secondary | ICD-10-CM | POA: Diagnosis not present

## 2021-08-17 LAB — GLUCOSE, CAPILLARY
Glucose-Capillary: 132 mg/dL — ABNORMAL HIGH (ref 70–99)
Glucose-Capillary: 144 mg/dL — ABNORMAL HIGH (ref 70–99)
Glucose-Capillary: 159 mg/dL — ABNORMAL HIGH (ref 70–99)
Glucose-Capillary: 175 mg/dL — ABNORMAL HIGH (ref 70–99)
Glucose-Capillary: 192 mg/dL — ABNORMAL HIGH (ref 70–99)
Glucose-Capillary: 67 mg/dL — ABNORMAL LOW (ref 70–99)
Glucose-Capillary: 76 mg/dL (ref 70–99)
Glucose-Capillary: 87 mg/dL (ref 70–99)

## 2021-08-17 MED ORDER — LIDOCAINE-PRILOCAINE 2.5-2.5 % EX CREA: 1.0000 "application " | TOPICAL_CREAM | CUTANEOUS | Status: DC | PRN

## 2021-08-17 MED ORDER — ALTEPLASE 2 MG IJ SOLR: 2.0000 mg | Freq: Once | INTRAMUSCULAR | Status: DC | PRN

## 2021-08-17 MED ORDER — LIDOCAINE HCL (PF) 1 % IJ SOLN: 5.0000 mL | INTRAMUSCULAR | Status: DC | PRN

## 2021-08-17 MED ORDER — PENTAFLUOROPROP-TETRAFLUOROETH EX AERO: 1.0000 "application " | INHALATION_SPRAY | CUTANEOUS | Status: DC | PRN

## 2021-08-17 MED ORDER — SODIUM CHLORIDE 0.9 % IV SOLN: 100.0000 mL | INTRAVENOUS | Status: DC | PRN

## 2021-08-17 NOTE — Progress Notes (Signed)
[facility] PROGRESS NOTE  Amy [name] ZOX:[id number] DOB: 1946-03-19 DOA: 05/19/2021 PCP: [name], MD  Status:  Remains inpatient appropriate because: Unsafe d/c plan, IV treatments appropriate due to intensity of illness or inability to take PO, and Inpatient level of care appropriate due to severity of illness  Barriers to discharge:  Son now stating it was his understanding that the patient would remain on hemodialysis until transition to residential hospice.  He states that other family members need to come in and out of town as well as other family members need to be included in the decision-making process.  He reports that on 10/18 when he spoke to his mother she stated that she did not want to die and wanted to continue hemodialysis-he reported that since she is stating this he cannot go against her wishes and "play God". Discussed with son on the evening of 10/21, pts; son would like to wait till tomorrow to decide regarding comfort measures vs continue to do HD.    Level of care:  Med-Surg  Code Status: DNR Family communication: Spoke with patient's son by telephone on 10/19.   DVT prophylaxis: Eliquis COVID vaccination status: [name] 02/26/2020, Pfizer 04/10/2021    HPI: 75 y.o. 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 Patient presented to the ED on 7/25 with dark stool.  On presentation, hemoglobin was 9.6; GI was consulted.  She underwent EGD which was normal and flexible sigmoidoscopy which showed nonbleeding distal rectal ulcers/stercoral ulcers.  Subsequently, GI signed off.  Neurology was also consulted for altered mental status; MRI brain  was similar to prior MRI at [facility] with a large right MCA stroke.  EEG this admission unremarkable. Nephrology has been following for dialysis needs. PEG tube was desired by family but could not be placed because of overlying transverse colon.  General surgery was consulted for G-tube placement but patient does not want a G-tube and  was deemed to have capacity by psych team.  Subsequently after further more detailed evaluations and observation it was finally clarified that she lacked capacity.  Surgical team was consulted the fact that the patient lacks underlying dysphagia and even though she lacks capacity continues to express a desire to not have a gastrostomy tube placed they are reluctant to proceed with surgery and have ordered follow-up SLP evaluation regarding dysphagia.  As of 81/1 son still conflicted over whether or not to place GT and will use the next few days to discuss with other family members who will help him come to a decision.  10/10 Due to acute AMS CT of the head was obtained that revealed possible changes concerning for acute versus subacute stroke.  MRI showed similar findings.  Reviewed by stroke neurologist who was concerned that changes were more consistent with seizure etiology.  LTM EEG consistent with metabolic encephalopathy and stroke physiology  As of 10/12 son updated on above findings and worsening clinical status of his mother.  Plan is to no longer attempt to pursue feeding tube and instead focus on hospice care.  Son is visiting local hospice facilities before making a decision about disposition.  On 10/17 hospice liaison contact the son since he is having difficulty with decision making regarding residential hospice but  was unable to directly speak with him.    10/19 updated son regarding declining status now that she has been off dialysis for 1 week.  Son reported that he did not realize this and had not agreed to this and stated he would not make such a medical decision  without consulting patient's goddaughter [name] who is a Marine scientist.  Extensive conversation held with son and [name]; at this time decision has been made to resume hemodialysis.  Nephrology reconsulted and plans are to begin hemodialysis treatments on 10/20.  Of note on 10/19 son told me he would be at the hospital "around 3 PM" I waited on unit 44M from 2:30 PM to 3:30 PM and the son did not show up nor call the unit to state he was not coming.  1400 after patient returned from dialysis was made aware of abnormal respiratory pattern that appeared consistent with Cheyne-Stokes breathing.  Upon my examination of the patient she had intermittent labored respiratory effort as well as a pattern of Cheyne-Stokes.  She was hallucinating.  Speech was somewhat incoherent.  Son [name] updated about change in status and informed to gather the family since given these changes patient could be dying soon.  He stated he would come to the hospital notify other family members.  10/22 pt is more alert and able to take medication by mouth and nephrology plan for HD tomorrow. No family at bedside, on 10/22 or today. Will conitnue to monitor.     Subjective: Opened eyes briefly.   Objective: Vitals:   08/17/21 1016 08/17/21 1621  BP: (!) 173/64 (!) 158/60  Pulse: (!) 114 100  Resp: 12 12  Temp: 98 F (36.7 C) 98.6 F (37 C)  SpO2: 100% 100%    Intake/Output Summary (Last 24 hours) at 08/17/2021 1814 Last data filed at 08/17/2021 1641 Gross per 24 hour  Intake --  Output 0 ml  Net 0 ml     Filed Weights   08/06/21 2040 08/15/21 0802 08/15/21 1219  Weight: 96.2 kg 80.3 kg 79.8 kg    Exam:  General exam: elderly lady,appears comfortable.  Respiratory system: diminished at bases, on [place] oxygen.  Cardiovascular system: S1 ,S2 heard. RRR.  Gastrointestinal system: Abdomen is soft, bowel sounds wnl.  Central nervous system: minimally interactive this morning. Opens eyes to verbal cues.  Extremities: no pedal  edema.  Skin:  no rashes seen.  Psychiatry: cannot be assessed.    Assessment/Plan: Acute problems: Sepsis physiology/RLL HCAP/Abn blood cxs c/w contaminant Completed 7 days of Maxipime-current white count normal as of 10/19  Failure to thrive with generalized deconditioning in context of neurogenic dysphagia Given acute stroke patient is no longer a candidate for open G-tube placement.  Her son agrees  Patient took her meds earlier today. No new events.     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 No new events today.   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  Given recent stroke she is not a candidate for operative placement/open G-tube  Suspected dementia/recent delirium Continue Remeron-patient has not been taking medications offered  ESRD on HD Has transitioned to comfort care/hospice care have informed nephrology that dialysis has been discontinued As of 10/19 after extensive discussion with son [name] as well as goddaughter [name] in [place] request has been made to resume hemodialysis while family discusses end-of-life issues. Son asked his  mother when she was more alert on 10/18 if she wanted him to stop hemodialysis and she told him no because she did not want to die. Nephrology has resumed hemodialysis as of 10/20-incomplete treatment on 10/20 due to issues with tunneled dialysis catheter patency.  Additional dialysis completed on 10/21. Plan for HD tomorrow.  Further management as per nephrology.   Type 2 diabetes mellitus Episodes of hypoglycemia Hemoglobin A1c 5.1.  Continue CBG checks every 4 hours and provide SSI and/or dextrose boluses Continue Neurontin for peripheral neuropathy  Chronic thrombus left upper extremity Continue Eliquis if she is able to take.   Stage II mid coccyx pressure injury, present on admission CT pelvis 10/10 without evidence of abscess Continue local wound care        Data Reviewed:  CBC: Recent Labs  Lab 08/13/21 1151 08/15/21 0841 08/16/21 0417  WBC 10.5 17.1* 28.0*  NEUTROABS 9.2*  --   --   HGB 8.9* 9.4* 8.9*  HCT 29.5* 31.4* 30.0*  MCV 92.8 94.6 93.5  PLT 280 302 277      CBG: Recent Labs  Lab 08/17/21 0201 08/17/21 0410 08/17/21 0851 08/17/21 1133 08/17/21 1616  GLUCAP 67* 87 76 132* 144*      Scheduled Meds:  apixaban  5 mg Oral BID   Chlorhexidine Gluconate Cloth  6 each Topical Q0600   darbepoetin (ARANESP) injection - DIALYSIS  200 mcg Intravenous Q Fri-HD   feeding supplement  237 mL Oral BID BM   gabapentin  100 mg Oral Q12H   insulin aspart  0-6 Units Subcutaneous Q4H   mirtazapine  7.5 mg Oral QHS   OLANZapine zydis  2.5 mg Oral QHS   sodium chloride flush  10-40 mL Intracatheter Q12H   venlafaxine  37.5 mg Oral BID     Active Problems:   Essential hypertension   Type 2 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, with long-term current use of insulin (HCC)   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   ESRD on dialysis (HCC)   Dyslipidemia   Stage 2 skin ulcer of sacral region ([place])   Cerebral  thrombosis with cerebral infarction   Protein-calorie malnutrition, severe   Acute GI bleeding   Failure to thrive in adult   Rectal bleeding   Delirium due to another medical condition   Neurogenic dysphagia   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 ([place])   HCAP (healthcare-associated pneumonia)   Sepsis due to other etiology ([place])   Recurrent cerebrovascular accidents (CVAs) ([place])   End of life care   Consultants: Vascular surgery Nephrology Gastroenterology Neurology Palliative medicine General surgery  Procedures: EEG Echocardiogram [place] EGD  Antibiotics: Ceftriaxone 8/3 through 8/5   Time spent: 15 minutes    Hosie Poisson MD [facility] 7 am - 330 pm/M-F for direct patient care and secure chat Please refer to Amion for contact info 89  days

## 2021-08-17 NOTE — Progress Notes (Signed)
Pt to lethargic to give PO meds, pt refused wound care, said she was hurting too bad and didn't want to be bothered.  Louanne Skye 5:40 AM 08/17/21

## 2021-08-17 NOTE — Progress Notes (Signed)
[name] Progress Note   Subjective:    Seen and examined patient at bedside. No acute changes. Plan for HD 10/24.  Objective Vitals:   08/16/21 0900 08/16/21 1702 08/17/21 0500 08/17/21 1016  BP: (!) 122/50 (!) 116/49 (!) 145/58 (!) 173/64  Pulse: (!) 108 (!) 104 64 (!) 114  Resp: (!) 8 (!) 8 20 12   Temp: 99.1 F (37.3 C) 99 F (37.2 C) 98.9 F (37.2 C) 98 F (36.7 C)  TempSrc: Axillary Axillary Axillary   SpO2: 100% 99% 90% 100%  Weight:      Height:       Physical Exam General: Elderly female; appears comfortable; NAD Heart: Mildly tachycardic, regular rhythm, 2/6 systolic murmur Lungs: CTA anteriorly Abdomen: Soft, non-distended, +BS Extremities: No edema b/l lower extremities Dialysis Access:  [facility] accessed   [facility] Weights   08/06/21 2040 08/15/21 0802 08/15/21 1219  Weight: 96.2 kg 80.3 kg 79.8 kg    Intake/Output Summary (Last 24 hours) at 08/17/2021 1201 Last data filed at 08/17/2021 0252 Gross per 24 hour  Intake 0 ml  Output 0 ml  Net 0 ml    Additional Objective Labs: Basic Metabolic Panel: Recent Labs  Lab 08/13/21 1151 08/15/21 0428  NA 136 136  K 5.0 4.1  CL 101 102  CO2 26 26  GLUCOSE 135* 64*  BUN 39* 18  CREATININE 7.41* 4.51*  CALCIUM 8.8* 8.3*  PHOS 4.3 3.0   Liver Function Tests: Recent Labs  Lab 08/13/21 1151 08/15/21 0428  ALBUMIN 1.7* 1.7*   No results for input(s): LIPASE, AMYLASE in the last 168 hours. CBC: Recent Labs  Lab 08/13/21 1151 08/15/21 0841 08/16/21 0417  WBC 10.5 17.1* 28.0*  NEUTROABS 9.2*  --   --   HGB 8.9* 9.4* 8.9*  HCT 29.5* 31.4* 30.0*  MCV 92.8 94.6 93.5  PLT 280 302 277   Blood Culture    Component Value Date/Time   SDES BLOOD RIGHT HAND 08/04/2021 0848   SPECREQUEST  08/04/2021 0848    BOTTLES DRAWN AEROBIC AND ANAEROBIC Blood Culture adequate volume   CULT  08/04/2021 0848    NO GROWTH 5 DAYS Performed at [facility], [place] [address], [place], [place]  [address]    REPTSTATUS 08/09/2021 FINAL 08/04/2021 0848    Cardiac Enzymes: No results for input(s): CKTOTAL, CKMB, CKMBINDEX, TROPONINI in the last 168 hours. CBG: Recent Labs  Lab 08/16/21 1631 08/17/21 0201 08/17/21 0410 08/17/21 0851 08/17/21 1133  GLUCAP 73 67* 87 76 132*   Iron Studies: No results for input(s): IRON, TIBC, TRANSFERRIN, FERRITIN in the last 72 hours. Lab Results  Component Value Date   INR 0.9 03/10/2021   INR 1.0 01/26/2021   Studies/Results: No results found.  Medications:   apixaban  5 mg Oral BID   Chlorhexidine Gluconate Cloth  6 each Topical Q0600   darbepoetin (ARANESP) injection - DIALYSIS  200 mcg Intravenous Q Fri-HD   feeding supplement  237 mL Oral BID BM   gabapentin  100 mg Oral Q12H   insulin aspart  0-6 Units Subcutaneous Q4H   mirtazapine  7.5 mg Oral QHS   OLANZapine zydis  2.5 mg Oral QHS   sodium chloride flush  10-40 mL Intracatheter Q12H   venlafaxine  37.5 mg Oral BID    Dialysis Orders: AF MWF  3h 72min 400/500 62.5kg 3K/2.5 Ca P2 AVG HeRO   Hep none - Mircera 50 q 2 wks  (7/13)  Venofer 50  q wk - Parsabiv 5mg  IV q HD - not on [facility] formulary   Assessment/Plan: AMS/FTT: Mental status that waxes/wanes. She appears very tired and weak. Family had decided on comfort measures and has now decided they would like her to continue HD. Melena/Rectal bleeding - GI consulted. Hx rectal ulcers.  EGD 7/27 normal, flex sig with mucosal ulceration c/w sterocoral ulcers, non bleeding. Hgb 8s---dropped to 6.5 on 10/10. S/p prbcs on 10/10. Hgb now 8.9 from 9.4 (10/21). Will resume ESA q Friday. Monitor trends. Leukocytosis -- ?Pneumonia/?Bacteremia: On IV abx - per primary. Blood cultures +S. Epidermidis. Resolved. ESRD: Her last dialysis was 08/04/21, resumed 10/20 per family's wishes. Tolerating HD on 10/20 with net UF 653ml. Plan for HD 10/24. Access Issues/AVG clotted: Had f'gram  6/23 /22 which showed chronic thrombus and was started on  Eliquis. AVG clotted 06/27/21, unable to be declotted d/t arm contracture. S/P conversion to tunneled HD catheter 07/02/21.  Hypertension/volume: BP variable, was on midodrine prior to comfort measures being initiated but now having trouble taking PO meds Anemia of ESRD:  continue Aranesp 286mcg q Fri. 1 unit prbcs on 08/04/21.  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 Needs updated labs-plan to order pre HD on 10/24. Nutrition: Alb very low. Not a candidate for feeding tube. If she is unable to eat she will not tolerate dialysis for very long.  Recent CVA with no evidence of meaningful recovery Psych eval from 9/14 determined that patient has functional mental capacity for medical decision-making. Psych re-consulted 9/23 to evaluate the patient again - at this point, patient does not have the capacity to make medical decisions. Ongoing discussions with patient's son continues Dispo: DNR/DNI. Poor prognosis. Family was interested in hospice and patient was on comfort measures, has now changed their mind. Patient will need to be strong enough to tolerate dialysis in a recliner before she can be discharged to outpatient.  Tobie Poet, NP [facility] 08/17/2021,12:01 PM  LOS: 89 days

## 2021-08-18 DIAGNOSIS — Z992 Dependence on renal dialysis: Secondary | ICD-10-CM | POA: Diagnosis not present

## 2021-08-18 DIAGNOSIS — R627 Adult failure to thrive: Secondary | ICD-10-CM | POA: Diagnosis not present

## 2021-08-18 DIAGNOSIS — N186 End stage renal disease: Secondary | ICD-10-CM | POA: Diagnosis not present

## 2021-08-18 LAB — RENAL FUNCTION PANEL
Albumin: 1.6 g/dL — ABNORMAL LOW (ref 3.5–5.0)
Anion gap: 10 (ref 5–15)
BUN: 19 mg/dL (ref 8–23)
CO2: 29 mmol/L (ref 22–32)
Calcium: 8.4 mg/dL — ABNORMAL LOW (ref 8.9–10.3)
Chloride: 97 mmol/L — ABNORMAL LOW (ref 98–111)
Creatinine, Ser: 4.37 mg/dL — ABNORMAL HIGH (ref 0.44–1.00)
GFR, Estimated: 10 mL/min — ABNORMAL LOW (ref >60)
Glucose, Bld: 118 mg/dL — ABNORMAL HIGH (ref 70–99)
Phosphorus: 2.1 mg/dL — ABNORMAL LOW (ref 2.5–4.6)
Potassium: 3.2 mmol/L — ABNORMAL LOW (ref 3.5–5.1)
Sodium: 136 mmol/L (ref 135–145)

## 2021-08-18 LAB — CBC
HCT: 32.8 % — ABNORMAL LOW (ref 36.0–46.0)
Hemoglobin: 9.9 g/dL — ABNORMAL LOW (ref 12.0–15.0)
MCH: 27.7 pg (ref 26.0–34.0)
MCHC: 30.2 g/dL (ref 30.0–36.0)
MCV: 91.9 fL (ref 80.0–100.0)
Platelets: 342 10*3/uL (ref 150–400)
RBC: 3.57 MIL/uL — ABNORMAL LOW (ref 3.87–5.11)
RDW: 19.3 % — ABNORMAL HIGH (ref 11.5–15.5)
WBC: 23.5 10*3/uL — ABNORMAL HIGH (ref 4.0–10.5)
nRBC: 0 % (ref 0.0–0.2)

## 2021-08-18 LAB — GLUCOSE, CAPILLARY
Glucose-Capillary: 121 mg/dL — ABNORMAL HIGH (ref 70–99)
Glucose-Capillary: 104 mg/dL — ABNORMAL HIGH (ref 70–99)
Glucose-Capillary: 132 mg/dL — ABNORMAL HIGH (ref 70–99)
Glucose-Capillary: 127 mg/dL — ABNORMAL HIGH (ref 70–99)

## 2021-08-18 MED ORDER — METHOCARBAMOL 1000 MG/10ML IJ SOLN
500.0000 mg | Freq: Three times a day (TID) | INTRAVENOUS | Status: DC
  Administered 2021-08-18 – 2021-08-19 (×5): 500 mg via INTRAVENOUS
  Filled 2021-08-18: qty 500
  Filled 2021-08-18: qty 5
  Filled 2021-08-18 (×2): qty 500
  Filled 2021-08-18: qty 5
  Filled 2021-08-18: qty 500
  Filled 2021-08-18: qty 5
  Filled 2021-08-18: qty 500

## 2021-08-18 MED ORDER — ALBUMIN HUMAN 25 % IV SOLN: 25.0000 g | Freq: Once | INTRAVENOUS | Status: AC

## 2021-08-18 MED ORDER — ALBUMIN HUMAN 25 % IV SOLN
INTRAVENOUS | Status: AC
  Administered 2021-08-18: 25 g via INTRAVENOUS
  Filled 2021-08-18: qty 100

## 2021-08-18 NOTE — Progress Notes (Signed)
[facility] PROGRESS NOTE  Amy [name] YOV:[id number] DOB: December 30, 1945 DOA: 05/19/2021 PCP: [name], MD  Status:  Remains inpatient appropriate because: Unsafe d/c plan, IV treatments appropriate due to intensity of illness or inability to take PO, and Inpatient level of care appropriate due to severity of illness  Barriers to discharge:  Son now stating it was his understanding that the patient would remain on hemodialysis until transition to residential hospice.  He states that other family members need to come in and out of town as well as other family members need to be included in the decision-making process.  He reports that on 10/18 when he spoke to his mother she stated that she did not want to die and wanted to continue hemodialysis-he reported that since she is stating this he cannot go against her wishes and "play God".   Level of care:  Med-Surg  Code Status: DNR Family communication: Spoke with patient's son by telephone on 10/19.   DVT prophylaxis: Eliquis COVID vaccination status: [name] 02/26/2020, Pfizer 04/10/2021    HPI: 75 y.o. 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 Patient presented to the ED on 7/25 with dark stool.  On presentation, hemoglobin was 9.6; GI was consulted.  She underwent EGD which was normal and flexible sigmoidoscopy which showed nonbleeding distal rectal ulcers/stercoral ulcers.  Subsequently, GI signed off.  Neurology was also consulted for altered mental status; MRI brain was similar to prior MRI at [facility] with a large right MCA stroke.  EEG this admission unremarkable. Nephrology has been following for dialysis needs.  PEG tube was desired by family but could not be placed because of overlying transverse colon.  General surgery was consulted for G-tube placement but patient does not want a G-tube and  was deemed to have capacity by psych team.  Subsequently after further more detailed evaluations and observation it was finally clarified that she lacked capacity.  Surgical team was consulted the fact that the patient lacks underlying dysphagia and even though she lacks capacity continues to express a desire to not have a gastrostomy tube placed they are reluctant to proceed with surgery and have ordered follow-up SLP evaluation regarding dysphagia.  As of 74/1 son still conflicted over whether or not to place GT and will use the next few days to discuss with other family members who will help him come to a decision.  10/10 Due to acute AMS CT of the head was obtained that revealed possible changes concerning for acute versus subacute stroke.  MRI showed similar findings.  Reviewed by stroke neurologist who was concerned that changes were more consistent with seizure etiology.  LTM EEG consistent with metabolic encephalopathy and stroke physiology  As of 10/12 son updated on above findings and worsening clinical status of his mother.  Plan is to no longer attempt to pursue feeding tube and instead focus on hospice care.  Son is visiting local hospice facilities before making a decision about disposition.  On 10/17 hospice liaison contact the son since he is having difficulty with decision making regarding residential hospice but was unable to directly speak with him.    10/19 updated son regarding declining status now that she has been off dialysis for 1 week.  Son reported that he did not realize this and had not agreed to this and stated he would not make such a medical decision without consulting patient's goddaughter [name] who is a Marine scientist.  Extensive conversation held with son and [name]; at this time decision has been made to  resume hemodialysis.  Nephrology reconsulted and plans are to begin hemodialysis treatments on 10/20.  Of note on 10/19 son told me he would be at the hospital "around 3 PM" I waited on unit 77M from 2:30 PM to 3:30 PM and the son did not show up nor call the unit to state he was not coming.  1400 after patient returned from dialysis was made aware of abnormal respiratory pattern that appeared consistent with Cheyne-Stokes breathing.  Upon my examination of the patient she had intermittent labored respiratory effort as well as a pattern of Cheyne-Stokes.  She was hallucinating.  Speech was somewhat incoherent.  Son [name] updated about change in status and informed to gather the family since given these changes patient could be dying soon.  He stated he would come to the hospital notify other family members.    Subjective: Examined during hemodialysis.  Patient awake and was responding to questions.  She said that she was hurting and when I asked her where she said her neck.  Brief neck massage given with some decrease in pain.  Objective: Vitals:   08/18/21 0700 08/18/21 0724  BP: (!) 151/65 (!) 145/69  Pulse: (!) 106 (!) 105  Resp: (!) 8   Temp: 99.5 F (37.5 C)   SpO2: 100%     Intake/Output Summary (Last 24 hours) at 08/18/2021 0828 Last data filed at 08/18/2021 0200 Gross per 24 hour  Intake 120 ml  Output 0 ml  Net 120 ml    Filed Weights   08/15/21 0802 08/15/21 1219 08/18/21 0700  Weight: 80.3 kg 79.8 kg 75 kg    Exam:  Constitutional: Awake during dialysis and able to answer questions Respiratory: Currently on 1.5 L oxygen, normal respiratory pattern except for diminished shallow effort, lung sounds clear Cardiovascular: S1-S2, pulse regular and tachycardic, stable bilateral UE edema R>L; SBP since 5 AM has varied from 113-151. Abdomen: LBM 10/23, abdomen soft nontender nondistended.  Bowel sounds + still not eating although she has taken some of her medications by  mouth Neurologic: CN 2-12 grossly intact on visual inspection-sensation remains intact, very limited spontaneous movement.  Unable to test strength but appears to be 1/5 x 4 extremity Psychiatric: Awakened during dialysis.  Was able to convey that she had pain in her neck.  Not oriented otherwise  Assessment/Plan: Acute problems: Sepsis physiology/RLL HCAP/Abn blood cxs c/w contaminant Completed 7 days of Maxipime-White count normalized as of 10/19 but has increased again over the past 2 days with readings of 28,000 and 23,000.  Failure to thrive with generalized deconditioning in context of neurogenic dysphagia Given acute stroke patient is no longer a candidate for open G-tube placement.  Her son agrees  Returning from dialysis on 10/21 patient was noted to be much less responsive, having short periods of apnea with shallow respiratory effort.  Raquel Sarna was notified and multiple family members came to the bedside and spent time with the patient over the weekend but her overall condition has remained stable BCs are now back up to greater than 23,000 and she continues to spike temperatures noting today at 11 AM her axillary temp was 100.1 which is equivalent to 101.1 oral Patient currently inconsistently taking  medications and is refusing food offered by staff.  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  Given recent stroke she is not a candidate for operative placement/open G-tube  Suspected dementia/recent delirium Continue Remeron  ESRD on HD Had transitioned to comfort care per my discussion with son [name].  See below regarding follow-up discussion As of 10/19 after extensive discussion with son [name] as well as goddaughter [name] in [place] request has been made to resume hemodialysis while family discusses end-of-life issues. Son asked his mother when she was more alert on 10/18 if she wanted him to stop hemodialysis and she told him no because she did not want to die. Nephrology continues to dialyze patient we requested  Type 2 diabetes mellitus Episodes of hypoglycemia Hemoglobin A1c 5.1.  Continue CBG checks every 4 hours and provide SSI and/or dextrose boluses Continue Neurontin for peripheral neuropathy  Chronic thrombus left upper extremity Continue Eliquis  Stage II mid coccyx pressure injury, present on admission CT pelvis 10/10 without evidence of abscess Continue local wound care        Data Reviewed:  CBC: Recent Labs  Lab 08/13/21 1151 08/15/21 0841 08/16/21 0417 08/18/21 0730  WBC 10.5 17.1* 28.0* 23.5*  NEUTROABS 9.2*  --   --   --   HGB 8.9* 9.4* 8.9* 9.9*  HCT 29.5* 31.4* 30.0* 32.8*  MCV 92.8 94.6 93.5 91.9  PLT 280 302 277 342     CBG: Recent Labs  Lab 08/17/21 1616 08/17/21 2113 08/17/21 2226 08/17/21 2344 08/18/21 0429  GLUCAP 144* 192* 159* 175* 121*     Scheduled Meds:  apixaban  5 mg Oral BID   Chlorhexidine Gluconate Cloth  6 each Topical Q0600   darbepoetin (ARANESP)  injection - DIALYSIS  200 mcg Intravenous Q Fri-HD   feeding supplement  237 mL Oral BID BM   gabapentin  100 mg Oral Q12H   insulin aspart  0-6 Units Subcutaneous Q4H   mirtazapine  7.5 mg Oral QHS   OLANZapine zydis  2.5 mg Oral QHS   sodium chloride flush  10-40 mL Intracatheter Q12H   venlafaxine  37.5 mg Oral BID     Active Problems:   Essential hypertension   Type 2 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, with long-term current use of insulin (HCC)   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   ESRD on dialysis (HCC)   Dyslipidemia   Stage 2 skin ulcer of sacral region ([place])   Cerebral thrombosis with cerebral infarction   Protein-calorie malnutrition, severe   Acute GI bleeding   Failure to thrive in adult   Rectal bleeding   Delirium due to another medical  condition   Neurogenic dysphagia   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 ([place])   HCAP (healthcare-associated pneumonia)   Sepsis due to other etiology [facility])   Recurrent cerebrovascular accidents (CVAs) ([place])   End of life care   Consultants: Vascular surgery Nephrology Gastroenterology Neurology Palliative medicine General surgery  Procedures: EEG Echocardiogram Cortrack EGD  Antibiotics: Ceftriaxone 8/3 through 8/5   Time spent: 25 minutes    Erin Hearing ANP  [facility] 7 am - 330 pm/M-F for direct patient care and secure chat Please refer to Amion for contact info 90  days

## 2021-08-18 NOTE — Progress Notes (Addendum)
[facility] Progress Note   Subjective:   Pt seen on HD. Much more alert today, tells me she has been able to take her medications this weekend. Denies SOB/CP/dizziness. Reports she feels cold.  Objective Vitals:   08/17/21 2110 08/18/21 0537 08/18/21 0700 08/18/21 0724  BP: (!) 179/73 (!) 166/61 (!) 151/65 (!) 145/69  Pulse: (!) 101 98 (!) 106 (!) 105  Resp: 18 18 (!) 8   Temp: 98.8 F (37.1 C) 98.6 F (37 C) 99.5 F (37.5 C)   TempSrc: Oral Axillary Axillary   SpO2: 100% 100% 100%   Weight:   75 kg   Height:       Physical Exam General: elderly, frail appearing female, alert and in NAD Heart: mildly tachycardic, 2/6 systolic murmur Lungs: CTA bilaterally anteriorly Abdomen: Soft, non-distended, no TTP, +Bs Extremities: No edema b/l lower extremities Dialysis Access: [facility] accessed  Additional Objective Labs: Basic Metabolic Panel: Recent Labs  Lab 08/13/21 1151 08/15/21 0428 08/18/21 0730  NA 136 136 136  K 5.0 4.1 3.2*  CL 101 102 97*  CO2 26 26 29   GLUCOSE 135* 64* 118*  BUN 39* 18 19  CREATININE 7.41* 4.51* 4.37*  CALCIUM 8.8* 8.3* 8.4*  PHOS 4.3 3.0 2.1*   Liver Function Tests: Recent Labs  Lab 08/13/21 1151 08/15/21 0428 08/18/21 0730  ALBUMIN 1.7* 1.7* 1.6*   No results for input(s): LIPASE, AMYLASE in the last 168 hours. CBC: Recent Labs  Lab 08/13/21 1151 08/15/21 0841 08/16/21 0417 08/18/21 0730  WBC 10.5 17.1* 28.0* 23.5*  NEUTROABS 9.2*  --   --   --   HGB 8.9* 9.4* 8.9* 9.9*  HCT 29.5* 31.4* 30.0* 32.8*  MCV 92.8 94.6 93.5 91.9  PLT 280 302 277 342   Blood Culture    Component Value Date/Time   SDES BLOOD RIGHT HAND 08/04/2021 0848   SPECREQUEST  08/04/2021 0848    BOTTLES DRAWN AEROBIC AND ANAEROBIC Blood Culture adequate volume   CULT  08/04/2021 0848    NO GROWTH 5 DAYS Performed at [facility], [place] [address] [place], [place] [address]    REPTSTATUS 08/09/2021 FINAL 08/04/2021 0848    Cardiac  Enzymes: No results for input(s): CKTOTAL, CKMB, CKMBINDEX, TROPONINI in the last 168 hours. CBG: Recent Labs  Lab 08/17/21 1616 08/17/21 2113 08/17/21 2226 08/17/21 2344 08/18/21 0429  GLUCAP 144* 192* 159* 175* 121*   Iron Studies: No results for input(s): IRON, TIBC, TRANSFERRIN, FERRITIN in the last 72 hours. @lablastinr3 @ Studies/Results: No results found. Medications:  sodium chloride     sodium chloride      apixaban  5 mg Oral BID   Chlorhexidine Gluconate Cloth  6 each Topical Q0600   darbepoetin (ARANESP) injection - DIALYSIS  200 mcg Intravenous Q Fri-HD   feeding supplement  237 mL Oral BID BM   gabapentin  100 mg Oral Q12H   insulin aspart  0-6 Units Subcutaneous Q4H   mirtazapine  7.5 mg Oral QHS   OLANZapine zydis  2.5 mg Oral QHS   sodium chloride flush  10-40 mL Intracatheter Q12H   venlafaxine  37.5 mg Oral BID    Dialysis Orders: AF MWF  3h 69min 400/500 62.5kg 3K/2.5 Ca P2 AVG HeRO   Hep none - Mircera 50 q 2 wks  (7/13)  Venofer 50 q wk - Parsabiv 5mg  IV q HD - not on [facility] formulary    Assessment/Plan: AMS/FTT: Mental status that waxes/wanes. She appears very  tired and weak. Family had decided on comfort measures and has now decided they would like her to continue HD. Anemia/Rectal bleeding - GI consulted. Hx rectal ulcers.  EGD 7/27 normal, flex sig with mucosal ulceration c/w sterocoral ulcers, non bleeding. Hgb 8s---dropped to 6.5 on 10/10. S/p prbcs on 10/10. Hgb now  9.9. Continue aranesp q Friday. S/p 1 unit prbcs on 08/04/21.  Leukocytosis -- ?Pneumonia/?Bacteremia: On IV abx - per primary. Blood cultures +S. Epidermidis. Resolved. ESRD: Her last dialysis was 08/04/21, resumed 10/20 per family's wishes. Tolerating HD well so far. Continue MWF schedule Access Issues/AVG clotted: Had f'gram  6/23 /22 which showed chronic thrombus and was started on Eliquis. AVG clotted 06/27/21, unable to be declotted d/t arm contracture. S/P conversion to tunneled  HD catheter 07/02/21.  Hypertension/volume: BP variable, was on midodrine prior to comfort measures being initiated. Fortunately BP is improved- now running slightly high.  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 Phos 2.1- will supplement if it drops any lower. Correctly calcium slightly high, no VDRA. Parsabiv not on formulary here.  Nutrition: Alb very low. Not a candidate for feeding tube. Poor PO intake, hopefully will improve as she is more alert today.  Recent CVA with no evidence of meaningful recovery Psych eval from 9/14 determined that patient has functional mental capacity for medical decision-making. Psych re-consulted 9/23 to evaluate the patient again - at this point, patient does not have the capacity to make medical decisions. Ongoing discussions with patient's son continues Dispo: DNR/DNI. Poor prognosis. Family was interested in hospice and patient was on comfort measures, has now changed their mind. Patient will need to be strong enough to tolerate dialysis in a recliner before she can be discharged to outpatient.     Anice Paganini, PA-C 08/18/2021, 8:26 AM  [facility] Pager: [phone]  Nephrology attending: Patient was seen and examined at dialysis unit.  I agree with above. Frail looking female, receiving dialysis and tolerating well.  BP acceptable so far.  Noted low phosphorus, not on binders.  Encourage oral intake.  Prognosis looks poor therefore recommend continue goals of care discussion and palliative care.  Katheran James, MD [facility].

## 2021-08-18 NOTE — Progress Notes (Signed)
UF turned off due to tachycardia.

## 2021-08-18 NOTE — Progress Notes (Signed)
Patient with tachycardia during treatment greater than 120's.  Anice Paganini nurse practitioner notified. Telephone order given for albumin and for UF to remain off.

## 2021-08-19 DIAGNOSIS — R1319 Other dysphagia: Secondary | ICD-10-CM | POA: Diagnosis not present

## 2021-08-19 DIAGNOSIS — I6330 Cerebral infarction due to thrombosis of unspecified cerebral artery: Secondary | ICD-10-CM | POA: Diagnosis not present

## 2021-08-19 DIAGNOSIS — R627 Adult failure to thrive: Secondary | ICD-10-CM | POA: Diagnosis not present

## 2021-08-19 DIAGNOSIS — F0280 Dementia in other diseases classified elsewhere without behavioral disturbance: Secondary | ICD-10-CM | POA: Diagnosis not present

## 2021-08-19 LAB — GLUCOSE, CAPILLARY
Glucose-Capillary: 139 mg/dL — ABNORMAL HIGH (ref 70–99)
Glucose-Capillary: 141 mg/dL — ABNORMAL HIGH (ref 70–99)
Glucose-Capillary: 145 mg/dL — ABNORMAL HIGH (ref 70–99)
Glucose-Capillary: 161 mg/dL — ABNORMAL HIGH (ref 70–99)
Glucose-Capillary: 167 mg/dL — ABNORMAL HIGH (ref 70–99)
Glucose-Capillary: 185 mg/dL — ABNORMAL HIGH (ref 70–99)

## 2021-08-19 MED ORDER — CHLORHEXIDINE GLUCONATE CLOTH 2 % EX PADS
6.0000 | MEDICATED_PAD | Freq: Every day | CUTANEOUS | Status: DC
  Administered 2021-08-19 – 2021-08-21 (×3): 6 via TOPICAL

## 2021-08-19 NOTE — Progress Notes (Addendum)
[facility] PROGRESS NOTE  Amy [name] YSA:[id number] DOB: 16-Jan-1946 DOA: 05/19/2021 PCP: Amy Maw, MD     Status:  Remains inpatient appropriate because: Unsafe d/c plan, IV treatments appropriate due to intensity of illness or inability to take PO, and Inpatient level of care appropriate due to severity of illness  Barriers to discharge:  Son now stating it was his understanding that the patient would remain on hemodialysis until transition to residential hospice.  He states that other family members need to come in and out of town as well as other family members need to be included in the decision-making process.  He reports that on 10/18 when he spoke to his mother she stated that she did not want to die and wanted to continue hemodialysis-he reported that since she is stating this he cannot go against her wishes and "play God".   Level of care:  Med-Surg  Code Status: DNR Family communication: Spoke with patient's son by telephone on 10/19.   DVT prophylaxis: Eliquis COVID vaccination status: Amy [name] 02/26/2020, Pfizer 04/10/2021    HPI: 75 y.o. 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 Patient presented to the ED on 7/25 with dark stool.  On presentation, hemoglobin was 9.6; GI was consulted.  She underwent EGD which was normal and flexible sigmoidoscopy which showed nonbleeding distal rectal ulcers/stercoral ulcers.  Subsequently, GI signed off.  Neurology was also consulted for altered mental status; MRI brain was similar to prior MRI at [facility] with a large right MCA stroke.  EEG this admission unremarkable. PEG tube was desired by family but could not  be placed because of overlying transverse colon.  General surgery was consulted for G-tube placement. Patient deemed high risk for open placement of gastrostomy tube.  Subsequently she has suffered another stroke which precludes her from any surgical procedures including open G-tube placement.  This was discussed with her son who was in agreement.  Unfortunately he was more conflicted about stopping dialysis noting prior to the stroke and subsequently since that time each time he is asked his mother regarding stopping dialysis she has told him that she does not want to die and wants to continue dialysis.  Consideration given to transitioning to residential hospice once family in agreement about how to proceed with end-of-life issues.  Patient did have an episode of decline immediately following dialysis on 10/20 but subsequently recovered.  She continues not to eat and intermittently takes medications by mouth.   Subjective: Awake.  Somewhat uncomfortable due to repositioning required for pericare  Objective: Vitals:   08/18/21 1614 08/19/21 0039  BP: (!) 120/52 (!) 165/71  Pulse: (!) 102 88  Resp: 18 16  Temp: 99.4 F (37.4 C) 99.9 F (37.7 C)  SpO2: 100% 98%    Intake/Output Summary (Last 24 hours) at 08/19/2021 0800 Last data filed at 08/18/2021 1700 Gross per 24 hour  Intake 0 ml  Output -10 ml  Net 10 ml    Filed Weights   08/15/21 1219 08/18/21 0700 08/18/21 1100  Weight: 79.8 kg 75 kg 75.1 kg    Exam:  Constitutional: Awake without acute distress during repositioning for.  Care Respiratory: Currently on 1.5 L oxygen, normal respiratory pattern with episodic shallow effort, lung  sounds clear otherwise. Cardiovascular: S1-S2, pulse regular and tachycardic, stable bilateral UE edema R>L Abdomen: LBM 10/23, abdomen soft nontender nondistended.  Bowel sounds +, not eating intermittently takes medications by mouth Neurologic: CN 2-12 grossly intact on visual  inspection-sensation remains intact, very limited spontaneous movement.  Unable to test strength but appears to be 1/5 x 4 extremity Psychiatric: Awake.  Able to convey that neck pain better when asked.  Assessment/Plan: Acute problems: Sepsis physiology/RLL HCAP/Abn blood cxs c/w contaminant Completed 7 days of Maxipime-White count normalized as of 10/19 but has increased again over the past 2 days with readings of 28,000 and 23,000.  Failure to thrive with generalized deconditioning in context of neurogenic dysphagia Given acute stroke patient is no longer a candidate for open G-tube placement.  Her son agrees  Patient currently inconsistently taking medications and is refusing food offered by staff.  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  Given recent stroke she is not a candidate for operative placement/open G-tube  Suspected dementia/recent delirium Continue  Remeron  ESRD on HD Had transitioned to comfort care per my discussion with son Amy [name].  See below regarding follow-up discussion As of 10/19 after extensive discussion with son Amy [name] as well as goddaughter [name] in [place] request has been made to resume hemodialysis while family discusses end-of-life issues. Son asked his mother when she was more alert on 10/18 if she wanted him to stop hemodialysis and she told him no because she did not want to die Nephrology managing dialysis-with dialysis on 10/24 unable to ultrafiltrate patient due to tachycardia likely related to underlying dehydration.  Unclear at this juncture regarding patient's clinical stability to continue dialysis.  Defer to nephrology team.  Neck pain Continue scheduled low-dose Robaxin IV-this is the least sedating of muscle relaxers and trying to avoid narcotics  Type 2 diabetes mellitus Episodes of hypoglycemia Hemoglobin A1c 5.1.  Continue CBG checks every 4 hours and provide SSI and/or dextrose boluses Continue Neurontin for peripheral neuropathy  Chronic thrombus left upper extremity Continue Eliquis  Stage II mid coccyx pressure injury, present on admission CT pelvis 10/10 without evidence of abscess Continue local wound care        Data Reviewed:  CBC: Recent Labs  Lab 08/13/21 1151 08/15/21 0841 08/16/21 0417 08/18/21 0730  WBC 10.5 17.1* 28.0* 23.5*  NEUTROABS 9.2*  --   --   --   HGB 8.9* 9.4* 8.9* 9.9*  HCT 29.5* 31.4* 30.0* 32.8*  MCV 92.8 94.6 93.5 91.9  PLT 280 302 277 342     CBG: Recent Labs  Lab 08/18/21 1613 08/18/21 2007 08/19/21 0009 08/19/21  0406 08/19/21 0755  GLUCAP 132* 127* 145* 139* 141*     Scheduled Meds:  apixaban  5 mg Oral BID   Chlorhexidine Gluconate Cloth  6 each Topical Q0600   darbepoetin (ARANESP) injection - DIALYSIS  200 mcg Intravenous Q Fri-HD   feeding supplement  237 mL Oral BID BM   gabapentin  100 mg Oral Q12H   insulin aspart  0-6 Units  Subcutaneous Q4H   mirtazapine  7.5 mg Oral QHS   OLANZapine zydis  2.5 mg Oral QHS   sodium chloride flush  10-40 mL Intracatheter Q12H   venlafaxine  37.5 mg Oral BID     Active Problems:   Essential hypertension   Type 2 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, with long-term current use of insulin (HCC)   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   ESRD on dialysis ([place])   Dyslipidemia   Stage 2 skin ulcer of sacral region ([place])   Cerebral thrombosis with cerebral infarction   Protein-calorie malnutrition, severe   Acute GI bleeding   Failure to thrive in adult   Rectal bleeding   Delirium due to another medical condition   Neurogenic dysphagia   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 ([place])   HCAP (healthcare-associated pneumonia)   Sepsis due to other etiology [facility])   Recurrent cerebrovascular accidents (CVAs) ([place])   End of life care   Consultants: Vascular surgery Nephrology Gastroenterology Neurology Palliative medicine General surgery  Procedures: EEG Echocardiogram Cortrack EGD  Antibiotics: Ceftriaxone 8/3 through 8/5   Time spent: 15 minutes    Erin Hearing ANP  [facility] 7 am - 330 pm/M-F for direct patient care and secure chat Please refer to Amion for contact info 91  days

## 2021-08-19 NOTE — Progress Notes (Addendum)
[facility] Progress Note   Subjective:   No UF with HD yesterday due to tachycardia. BP and HR now stable. Pt seen in room, not opening eyes or responding to voice this AM.   Objective Vitals:   08/18/21 1357 08/18/21 1614 08/19/21 0039 08/19/21 0700  BP:  (!) 120/52 (!) 165/71 (!) 114/47  Pulse: (!) 105 (!) 102 88 98  Resp:  18 16 18   Temp: 98.5 F (36.9 C) 99.4 F (37.4 C) 99.9 F (37.7 C) 100 F (37.8 C)  TempSrc: Oral Oral Axillary Axillary  SpO2:  100% 98% 100%  Weight:      Height:       Physical Exam General: Elderly female, sleeping, in NAD Heart: RRR, 2/6 systolic murmur Lungs: CTA bilaterally without wheezing/rales Abdomen: Soft, non-distended, +BS Extremities: no edema b/l lower extremities Dialysis Access: [facility] with dry bandage  Additional Objective Labs: Basic Metabolic Panel: Recent Labs  Lab 08/13/21 1151 08/15/21 0428 08/18/21 0730  NA 136 136 136  K 5.0 4.1 3.2*  CL 101 102 97*  CO2 26 26 29   GLUCOSE 135* 64* 118*  BUN 39* 18 19  CREATININE 7.41* 4.51* 4.37*  CALCIUM 8.8* 8.3* 8.4*  PHOS 4.3 3.0 2.1*   Liver Function Tests: Recent Labs  Lab 08/13/21 1151 08/15/21 0428 08/18/21 0730  ALBUMIN 1.7* 1.7* 1.6*   No results for input(s): LIPASE, AMYLASE in the last 168 hours. CBC: Recent Labs  Lab 08/13/21 1151 08/15/21 0841 08/16/21 0417 08/18/21 0730  WBC 10.5 17.1* 28.0* 23.5*  NEUTROABS 9.2*  --   --   --   HGB 8.9* 9.4* 8.9* 9.9*  HCT 29.5* 31.4* 30.0* 32.8*  MCV 92.8 94.6 93.5 91.9  PLT 280 302 277 342   Blood Culture    Component Value Date/Time   SDES BLOOD RIGHT HAND 08/04/2021 0848   SPECREQUEST  08/04/2021 0848    BOTTLES DRAWN AEROBIC AND ANAEROBIC Blood Culture adequate volume   CULT  08/04/2021 0848    NO GROWTH 5 DAYS Performed at [facility], [place] [address] [place], [place] [address]    REPTSTATUS 08/09/2021 FINAL 08/04/2021 0848    Cardiac Enzymes: No results for input(s): CKTOTAL,  CKMB, CKMBINDEX, TROPONINI in the last 168 hours. CBG: Recent Labs  Lab 08/18/21 1613 08/18/21 2007 08/19/21 0009 08/19/21 0406 08/19/21 0755  GLUCAP 132* 127* 145* 139* 141*   Iron Studies: No results for input(s): IRON, TIBC, TRANSFERRIN, FERRITIN in the last 72 hours. @lablastinr3 @ Studies/Results: No results found. Medications:  methocarbamol (ROBAXIN) IV 500 mg (08/19/21 1443)    apixaban  5 mg Oral BID   Chlorhexidine Gluconate Cloth  6 each Topical Q0600   darbepoetin (ARANESP) injection - DIALYSIS  200 mcg Intravenous Q Fri-HD   feeding supplement  237 mL Oral BID BM   gabapentin  100 mg Oral Q12H   insulin aspart  0-6 Units Subcutaneous Q4H   mirtazapine  7.5 mg Oral QHS   OLANZapine zydis  2.5 mg Oral QHS   sodium chloride flush  10-40 mL Intracatheter Q12H   venlafaxine  37.5 mg Oral BID    Dialysis Orders: AF MWF  3h 10min 400/500 62.5kg 3K/2.5 Ca P2 AVG HeRO   Hep none - Mircera 50 q 2 wks  (7/13)  Venofer 50 q wk - Parsabiv 5mg  IV q HD - not on [facility] formulary  Assessment/Plan: AMS/FTT: Mental status that waxes/wanes. She appears very tired and weak. Family had decided on comfort  measures and has now decided they would like her to continue HD. Anemia/Rectal bleeding - GI consulted. Hx rectal ulcers.  EGD 7/27 normal, flex sig with mucosal ulceration c/w sterocoral ulcers, non bleeding. Hgb 8s---dropped to 6.5 on 10/10. S/p prbcs on 10/10. Hgb now  9.9. Continue aranesp q Friday. S Leukocytosis -- ?Pneumonia/?Bacteremia: Was on IV abx - per primary. Blood cultures +S. Epidermidis. Resolved. ESRD: Her last dialysis was 08/04/21, resumed 10/20 per family's wishes. 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 AVG clotted 06/27/21, unable to be declotted d/t arm contracture. S/P conversion to tunneled HD catheter 07/02/21.  Hypertension/volume: BP  variable, was on midodrine prior to comfort measures being initiated. Fortunately BP is improved. 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 Phos 2.1- will supplement if it drops any lower. Correctly calcium slightly high, no VDRA. Parsabiv not on formulary here.  Nutrition: Alb very low. Not a candidate for feeding tube. Poor PO intake. Recent CVA with no evidence of meaningful recovery Psych eval from 9/14 determined that patient has functional mental capacity for medical decision-making. Psych re-consulted 9/23 to evaluate the patient again - at this point, patient does not have the capacity to make medical decisions. Ongoing discussions with patient's son continues Dispo: DNR/DNI. Poor prognosis. Family was interested in hospice and patient was on comfort measures, has now changed their mind. Patient will need to be strong enough to tolerate dialysis in a recliner before she can be discharged to outpatient.  Anice Paganini, PA-C 08/19/2021, 8:29 AM  [facility] Pager: [phone]  Nephrology attending: Patient was seen and examined.  Chart reviewed.  I agree with above. For hemodialysis was completed by tachycardia yesterday.  Unable to ultrafiltrate.  Continue goals of care discussion.  Recommend comfort care.  Katheran James, MD [facility].

## 2021-08-19 NOTE — TOC Progression Note (Signed)
Transition of Care [facility]) - Progression Note    Patient Details  Name: Amy [name] MRN: [id number] Date of Birth: 11-24-1945  Transition of Care [facility]) CM/SW [place], RN Phone Number: 08/19/2021, 11:57 AM  Clinical Narrative:    CM spoke with Erin Hearing, NP and states that attending physician plans to speak with the patient's son today regarding patient update and comfort care measures.  In previous conversation with the patient's son, Amy [name], he was not agreeable to discontinuing dialysis nor transfer to Inpatient hospice facility for care.   MSW with DTP Team continue to follow the patient for TOC needs.     Expected Discharge Plan: [place] Barriers to Discharge: Continued Medical Work up  Expected Discharge Plan and Services Expected Discharge Plan: [place] In-house Referral: Clinical Social Work     Living arrangements for the past 2 months: [place] (Patient came to hospital from [place])                                       Social Determinants of Health (SDOH) Interventions    Readmission Risk Interventions Readmission Risk Prevention Plan 08/04/2021  Transportation Screening Complete  Medication Review [employer]) Complete  PCP or Specialist appointment within 3-5 days of discharge Complete  HRI or Home Care Consult Complete  SW Recovery Care/Counseling Consult Complete  Palliative Care Screening Complete  Skilled Nursing Facility Complete  Some recent data might be hidden

## 2021-08-20 DIAGNOSIS — E43 Unspecified severe protein-calorie malnutrition: Secondary | ICD-10-CM | POA: Diagnosis not present

## 2021-08-20 DIAGNOSIS — I6330 Cerebral infarction due to thrombosis of unspecified cerebral artery: Secondary | ICD-10-CM | POA: Diagnosis not present

## 2021-08-20 DIAGNOSIS — I639 Cerebral infarction, unspecified: Secondary | ICD-10-CM | POA: Diagnosis not present

## 2021-08-20 DIAGNOSIS — R1319 Other dysphagia: Secondary | ICD-10-CM | POA: Diagnosis not present

## 2021-08-20 LAB — GLUCOSE, CAPILLARY
Glucose-Capillary: 148 mg/dL — ABNORMAL HIGH (ref 70–99)
Glucose-Capillary: 158 mg/dL — ABNORMAL HIGH (ref 70–99)
Glucose-Capillary: 168 mg/dL — ABNORMAL HIGH (ref 70–99)

## 2021-08-20 MED ORDER — METHOCARBAMOL 500 MG PO TABS
500.0000 mg | ORAL_TABLET | Freq: Three times a day (TID) | ORAL | Status: DC
  Administered 2021-08-20 – 2021-08-26 (×15): 500 mg via ORAL
  Filled 2021-08-20 (×16): qty 1

## 2021-08-20 NOTE — Progress Notes (Signed)
Patient ID: ZLATY ALEXA, female   DOB: 1946-01-19, 75 y.o.   MRN: [id number]    Progress Note from the Palliative Medicine Team at [facility]   Patient Name: Amy [name]        Date: 08/20/2021 DOB: 04/28/1946  Age: 75 y.o. MRN#: [id number] Attending Physician: Thurnell Lose, MD Primary Care Physician: [name], MD Admit Date: 05/19/2021   Medical records reviewed, discussed with treatment team regarding reconsult on [name]  75 y.o. female   admitted on 05/19/2021 with past  medical history significant of ESRD on dialysis MWF, HTN, HLD, CVA s/p TPA 02/2021,   left hemiparesis from CVA   03/26/21 Flex sig, at [facility]: multiple rectal ulcers.  Path: Polypoid fragment of colorectal mucosa with crypt architectural disorder including slight hyperplastic features, mild fibromuscular hyperplasia of the lamina propria, and adherent necroinflammatory debris suggestive of ulcer exudate. 05/21/21 Flex sig: clean based, non-bleeding distal rectl ulcers (stercoral ulcer).  Blood clots and solid stool limited visualization.   05/21/21 EGD: Normal.    Long complicated hospitalization.  Patient continued to decline physically, functionally and cognitively within the scope of full medical support. Patient has been seen multiple times by palliative medicine and palliative medicine team has spoken with son [name] on multiple occasions.  Son has demonstrated no interest in speaking with the palliative medicine team over the past several weeks.. He has my contact information and has been encouraged to call for any needs.  PMT will sign off at this time  No charge   Wadie Lessen NP  Palliative Medicine Team Team Phone # [phone] Pager [phone]

## 2021-08-20 NOTE — Progress Notes (Addendum)
[name] Progress Note   Subjective:   Pt seen on HD, mild tachycardia, otherwise tolerating so far. Alert, does not answer ROS questions but does tell me she wants ice cream. AM lab results pending.   Objective Vitals:   08/20/21 0900 08/20/21 0930 08/20/21 1000 08/20/21 1030  BP: (!) 147/77 (!) 143/76 132/69 136/68  Pulse:  (!) 107    Resp: 15 13 15 13   Temp:      TempSrc:      SpO2:      Weight:      Height:       Physical Exam General: elderly, frail appearing female in NAD Heart: RRR, 2/6 systolic murmur Lungs: CTA bilaterally Abdomen: Soft, non-distended, +BS Extremities: No edema b/l lower extremities Dialysis Access:  [facility] accessed  Additional Objective Labs: Basic Metabolic Panel: Recent Labs  Lab 08/13/21 1151 08/15/21 0428 08/18/21 0730  NA 136 136 136  K 5.0 4.1 3.2*  CL 101 102 97*  CO2 26 26 29   GLUCOSE 135* 64* 118*  BUN 39* 18 19  CREATININE 7.41* 4.51* 4.37*  CALCIUM 8.8* 8.3* 8.4*  PHOS 4.3 3.0 2.1*   Liver Function Tests: Recent Labs  Lab 08/13/21 1151 08/15/21 0428 08/18/21 0730  ALBUMIN 1.7* 1.7* 1.6*   No results for input(s): LIPASE, AMYLASE in the last 168 hours. CBC: Recent Labs  Lab 08/13/21 1151 08/15/21 0841 08/16/21 0417 08/18/21 0730  WBC 10.5 17.1* 28.0* 23.5*  NEUTROABS 9.2*  --   --   --   HGB 8.9* 9.4* 8.9* 9.9*  HCT 29.5* 31.4* 30.0* 32.8*  MCV 92.8 94.6 93.5 91.9  PLT 280 302 277 342   Blood Culture    Component Value Date/Time   SDES BLOOD RIGHT HAND 08/04/2021 0848   SPECREQUEST  08/04/2021 0848    BOTTLES DRAWN AEROBIC AND ANAEROBIC Blood Culture adequate volume   CULT  08/04/2021 0848    NO GROWTH 5 DAYS Performed at [facility], [place] [address], [place], [place] [address]    REPTSTATUS 08/09/2021 FINAL 08/04/2021 0848    Cardiac Enzymes: No results for input(s): CKTOTAL, CKMB, CKMBINDEX, TROPONINI in the last 168 hours. CBG: Recent Labs  Lab 08/19/21 1157  08/19/21 1623 08/19/21 2016 08/20/21 0002 08/20/21 0414  GLUCAP 161* 185* 167* 168* 158*   Iron Studies: No results for input(s): IRON, TIBC, TRANSFERRIN, FERRITIN in the last 72 hours. @lablastinr3 @ Studies/Results: No results found. Medications:  methocarbamol (ROBAXIN) IV 500 mg (08/19/21 2309)    apixaban  5 mg Oral BID   Chlorhexidine Gluconate Cloth  6 each Topical Q0600   darbepoetin (ARANESP) injection - DIALYSIS  200 mcg Intravenous Q Fri-HD   feeding supplement  237 mL Oral BID BM   gabapentin  100 mg Oral Q12H   mirtazapine  7.5 mg Oral QHS   OLANZapine zydis  2.5 mg Oral QHS   sodium chloride flush  10-40 mL Intracatheter Q12H   venlafaxine  37.5 mg Oral BID    Dialysis Orders: AF MWF  3h 30min 400/500 62.5kg 3K/2.5 Ca P2 AVG HeRO   Hep none - Mircera 50 q 2 wks  (7/13)  Venofer 50 q wk - Parsabiv 5mg  IV q HD - not on [facility] formulary  Assessment/Plan: AMS/FTT: Mental status that waxes/wanes. She appears very tired and weak. Family had decided on comfort measures and has now decided they would like her to continue HD. Anemia/Rectal bleeding - GI consulted. Hx rectal ulcers.  EGD 7/27  normal, flex sig with mucosal ulceration c/w sterocoral ulcers, non bleeding. Hgb 8s---dropped to 6.5 on 10/10. S/p prbcs on 10/10. Hgb now  9.9. Continue aranesp q Friday. S Leukocytosis -- ?Pneumonia/?Bacteremia: Was on IV abx - per primary. Blood cultures +S. Epidermidis. Resolved. ESRD: Her last dialysis was 08/04/21, resumed 10/20 per family's wishes. 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 AVG clotted 06/27/21, unable to be declotted d/t arm contracture. S/P conversion to tunneled HD catheter 07/02/21.  Hypertension/volume: BP variable, was on midodrine prior to comfort measures being initiated. Fortunately BP is improved. 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 Phos 2.1- will  supplement if it drops any lower. Correctly calcium slightly high, no VDRA. Parsabiv not on formulary here.  Nutrition: Alb very low. Not a candidate for feeding tube. Poor PO intake. Recent CVA with no evidence of meaningful recovery Psych eval from 9/14 determined that patient has functional mental capacity for medical decision-making. Psych re-consulted 9/23 to evaluate the patient again - at this point, patient does not have the capacity to make medical decisions. Ongoing discussions with patient's son continues Dispo: DNR/DNI. Poor prognosis. Family was interested in hospice and patient was on comfort measures, has now changed their mind. Patient will need to be strong enough to tolerate dialysis in a recliner before she can be discharged to outpatient.    Anice Paganini, PA-C 08/20/2021, 11:17 AM  [facility] Pager: [phone]  Nephrology attending on Patient was seen and examined at dialysis unit.  Chart reviewed.  I agree with above. Tolerating dialysis well so far except some tachycardia.  Mental status is waxing and waning.  Seen by palliative care, family not interested in hospice measures.  Prognosis remains poor.  Katheran James, MD [facility].

## 2021-08-20 NOTE — Progress Notes (Addendum)
[facility] PROGRESS NOTE  Amy [name] YYT:[id number] DOB: 1946-02-08 DOA: 05/19/2021 PCP: [name], MD     Status:  Remains inpatient appropriate because: Unsafe d/c plan, IV treatments appropriate due to intensity of illness or inability to take PO, and Inpatient level of care appropriate due to severity of illness  Barriers to discharge:  Son now stating it was his understanding that the patient would remain on hemodialysis until transition to residential hospice.  He states that other family members need to come in and out of town as well as other family members need to be included in the decision-making process.  He reports that on 10/18 when he spoke to his mother she stated that she did not want to die and wanted to continue hemodialysis-he reported that since she is stating this he cannot go against her wishes and "play God".   Level of care:  Med-Surg  Code Status: DNR Family communication: Spoke with patient's son by telephone on 10/19.   DVT prophylaxis: Eliquis COVID vaccination status: [name] 02/26/2020, Pfizer 04/10/2021    HPI: 75 y.o. 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 Patient presented to the ED on 7/25 with dark stool.  On presentation, hemoglobin was 9.6; GI was consulted.  She underwent EGD which was normal and flexible sigmoidoscopy which showed nonbleeding distal rectal ulcers/stercoral ulcers.  Subsequently, GI signed off.  Neurology was also consulted for altered mental status; MRI brain was similar to prior MRI at [facility] with a large right MCA stroke.  EEG this admission unremarkable. PEG tube was desired by family but could not  be placed because of overlying transverse colon.  General surgery was consulted for G-tube placement. Patient deemed high risk for open placement of gastrostomy tube.  Subsequently she has suffered another stroke which precludes her from any surgical procedures including open G-tube placement.  This was discussed with her son who was in agreement.  Unfortunately he was more conflicted about stopping dialysis noting prior to the stroke and subsequently since that time each time he is asked his mother regarding stopping dialysis she has told him that she does not want to die and wants to continue dialysis.  Consideration given to transitioning to residential hospice once family in agreement about how to proceed with end-of-life issues.  Patient did have an episode of decline immediately following dialysis on 10/20 but subsequently recovered.  She continues not to eat and intermittently takes medications by mouth.   Subjective: Awake.  Examined during dialysis.  She was awake when I asked her how she was doing if her neck was still hurting she stated "oh I'm hurting baby".  If her neck pain was at least a little better she stated yes  Objective: Vitals:   08/20/21 0725 08/20/21 0800  BP: (!) 164/84 (!) 152/84  Pulse: (!) 106 (!) 110  Resp: 14 15  Temp:    SpO2:      Intake/Output Summary (Last 24 hours) at 08/20/2021 0817 Last data filed at 08/20/2021 0515 Gross per 24 hour  Intake 170 ml  Output 0 ml  Net 170 ml    Filed Weights   08/15/21 1219 08/18/21 0700 08/18/21 1100  Weight: 79.8 kg 75 kg 75.1 kg  Exam:  Constitutional: Awake without acute distress during repositioning for.  Care Respiratory: Currently on 1 L oxygen with pulse oximetry readings between 98 and 100%, normal respiratory pattern with episodic shallow effort, lung sounds clear otherwise. Cardiovascular: S1-S2, pulse regular and tachycardic, stable bilateral UE edema R>L Abdomen: LBM 10/23, abdomen soft nontender  nondistended.  Bowel sounds +, in the past 24 hours has taken in about 150 cc of fluids as has eaten 0% of meals.  She did refuse afternoon Ensure Enlive yesterday. Neurologic: CN 2-12 grossly intact on visual inspection-sensation remains intact, very limited spontaneous movement.  Unable to test strength but appears to be 1/5 x 4 extremity Psychiatric: Awake.  Able to convey that neck pain slightly better when asked.  Assessment/Plan: Acute problems: Sepsis physiology/RLL HCAP/Abn blood cxs c/w contaminant Completed 7 days of Maxipime-White count normalized as of 10/19 but has increased again over the past 2 days with readings of 28,000 and 23,000.  Failure to thrive with generalized deconditioning in context of neurogenic dysphagia Given acute stroke patient is no longer a candidate for open G-tube placement.  Her son agrees  Discussed with attending physician on 10/25.  Since patient is not a candidate for a G-tube and we are currently not administering IV fluids it is inappropriate to treat hypoglycemia which is occurring secondary to poor oral intake.  Treating this gives family an inaccurate picture of actual clinical status that would occur in context of poor oral intake and malnutrition.  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  Given recent stroke she is not a candidate for operative placement/open G-tube  Suspected dementia/recent delirium Continue Remeron refused afternoon Effexor on 7/25 but took a.m. dose on 10/26  ESRD on HD Had transitioned to comfort care per my discussion with son [name].  See below regarding follow-up discussion As of 10/19 after extensive discussion with son [name] as well as goddaughter [name] in [place] request has been made to resume hemodialysis while family discusses end-of-life issues. Son asked his mother when she was more alert on 10/18 if she wanted him to stop hemodialysis and she told him no because she did not want to die Nephrology managing dialysis-with dialysis on 10/24 unable to ultrafiltrate patient due to tachycardia likely related to underlying dehydration.  Unclear at this juncture regarding patient's clinical stability to continue dialysis.  Defer to nephrology team. Given son's desire to pursue hemodialysis and patient otherwise stable for discharge we need to begin PT and OT to see if patient can tolerate getting out of bed to a wheelchair sitting for at least 2 hours, subsequently turning and pivoting to get into the recliner chair at dialysis and then repeating the same process after dialysis-this must be accomplished for her to be a candidate for outpatient hemodialysis  Neck pain  Continue scheduled low-dose Robaxin IV-this is the least sedating of muscle relaxers and trying to avoid narcotics Patient unable to get IV Robaxin on a.m. of 10/26 due to loss of IV access.  If unable to obtain IV access will change over to oral form  Type 2 diabetes mellitus Episodes of hypoglycemia Hemoglobin A1c 5.1.  Recently checking CBGs and patient without any significant hyperglycemia.  See  above regarding discontinuation of CBG checks and treatment of hypoglycemia Continue Neurontin for peripheral neuropathy  Chronic thrombus left upper extremity Continue Eliquis  Stage II mid coccyx pressure injury, present on admission CT pelvis 10/10 without evidence of abscess Continue local wound care        Data Reviewed:  CBC: Recent Labs  Lab 08/13/21 1151 08/15/21 0841 08/16/21 0417 08/18/21 0730  WBC 10.5 17.1* 28.0* 23.5*  NEUTROABS 9.2*  --   --   --   HGB 8.9* 9.4* 8.9* 9.9*  HCT 29.5* 31.4* 30.0* 32.8*  MCV 92.8 94.6 93.5 91.9  PLT 280 302 277 342     CBG: Recent Labs  Lab 08/19/21 1157 08/19/21 1623 08/19/21 2016 08/20/21 0002 08/20/21 0414  GLUCAP 161* 185* 167* 168* 158*     Scheduled Meds:  apixaban  5 mg Oral BID   Chlorhexidine Gluconate Cloth  6 each Topical Q0600   darbepoetin (ARANESP) injection - DIALYSIS  200 mcg Intravenous Q Fri-HD   feeding supplement  237 mL Oral BID BM   gabapentin  100 mg Oral Q12H   mirtazapine  7.5 mg Oral QHS   OLANZapine zydis  2.5 mg Oral QHS   sodium chloride flush  10-40 mL Intracatheter Q12H   venlafaxine  37.5 mg Oral BID     Active Problems:   Essential hypertension   Type 2 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, with long-term current use of insulin (HCC)   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   ESRD on dialysis (HCC)   Dyslipidemia   Stage 2 skin ulcer of sacral region ([place])   Cerebral thrombosis with cerebral infarction   Protein-calorie malnutrition, severe   Acute GI bleeding   Failure to thrive in adult   Rectal bleeding   Delirium due to another medical condition   Neurogenic dysphagia   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 ([place])   HCAP (healthcare-associated pneumonia)   Sepsis due to other etiology [facility])   Recurrent cerebrovascular accidents (CVAs) ([place])   End of life  care   Consultants: Vascular surgery Nephrology Gastroenterology Neurology Palliative medicine General surgery  Procedures: EEG Echocardiogram Cortrack EGD  Antibiotics: Ceftriaxone 8/3 through 8/5   Time spent: 15 minutes    Erin Hearing ANP  [facility] 7 am - 330 pm/M-F for direct patient care and secure chat Please refer to Amion for contact info 92  days

## 2021-08-20 NOTE — TOC Progression Note (Signed)
Transition of Care [facility]) - Progression Note    Patient Details  Name: Amy [name] MRN: [id number] Date of Birth: 10/31/1945  Transition of Care [facility]) CM/SW [place], RN Phone Number: 08/20/2021, 2:07 PM  Clinical Narrative:    Case management spoke with Erin Hearing, NP and Melven Sartorius, renal navigator and until the patient can sit up in a wheelchair for transport and geriatric chair for dialysis then the barrier to discharge to home or SNF at the point includes patient's inability attend outpatient dialysis appointments.  The family declined Inpatient hospice placement last week and requested continuance of dialysis treatments.  The patient at this point remains hospitalized and unable to transfer to LTC or home due to outpatient dialysis treatments.  CM and MSW with DTP Team will continue to follow the patient for transitions of care needs.   Expected Discharge Plan: [place] Barriers to Discharge: Continued Medical Work up  Expected Discharge Plan and Services Expected Discharge Plan: [place] In-house Referral: Clinical Social Work     Living arrangements for the past 2 months: [place] (Patient came to hospital from [place])                                       Social Determinants of Health (SDOH) Interventions    Readmission Risk Interventions Readmission Risk Prevention Plan 08/04/2021  Transportation Screening Complete  Medication Review [employer]) Complete  PCP or Specialist appointment within 3-5 days of discharge Complete  HRI or Home Care Consult Complete  SW Recovery Care/Counseling Consult Complete  Palliative Care Screening Complete  Skilled Nursing Facility Complete  Some recent data might be hidden

## 2021-08-21 ENCOUNTER — Inpatient Hospital Stay (HOSPITAL_COMMUNITY): Payer: Medicare Other

## 2021-08-21 DIAGNOSIS — R1319 Other dysphagia: Secondary | ICD-10-CM | POA: Diagnosis not present

## 2021-08-21 DIAGNOSIS — Z992 Dependence on renal dialysis: Secondary | ICD-10-CM | POA: Diagnosis not present

## 2021-08-21 DIAGNOSIS — N186 End stage renal disease: Secondary | ICD-10-CM | POA: Diagnosis not present

## 2021-08-21 DIAGNOSIS — R627 Adult failure to thrive: Secondary | ICD-10-CM | POA: Diagnosis not present

## 2021-08-21 LAB — CBC WITH DIFFERENTIAL/PLATELET
Abs Immature Granulocytes: 0.17 10*3/uL — ABNORMAL HIGH (ref 0.00–0.07)
Basophils Absolute: 0.1 10*3/uL (ref 0.0–0.1)
Basophils Relative: 0 %
Eosinophils Absolute: 0.1 10*3/uL (ref 0.0–0.5)
Eosinophils Relative: 1 %
HCT: 27.8 % — ABNORMAL LOW (ref 36.0–46.0)
Hemoglobin: 8.5 g/dL — ABNORMAL LOW (ref 12.0–15.0)
Immature Granulocytes: 1 %
Lymphocytes Relative: 9 %
Lymphs Abs: 1.5 10*3/uL (ref 0.7–4.0)
MCH: 27.4 pg (ref 26.0–34.0)
MCHC: 30.6 g/dL (ref 30.0–36.0)
MCV: 89.7 fL (ref 80.0–100.0)
Monocytes Absolute: 1.6 10*3/uL — ABNORMAL HIGH (ref 0.1–1.0)
Monocytes Relative: 10 %
Neutro Abs: 13.2 10*3/uL — ABNORMAL HIGH (ref 1.7–7.7)
Neutrophils Relative %: 79 %
Platelets: 253 10*3/uL (ref 150–400)
RBC: 3.1 MIL/uL — ABNORMAL LOW (ref 3.87–5.11)
RDW: 19.3 % — ABNORMAL HIGH (ref 11.5–15.5)
WBC: 16.6 10*3/uL — ABNORMAL HIGH (ref 4.0–10.5)
nRBC: 0.1 % (ref 0.0–0.2)

## 2021-08-21 LAB — COMPREHENSIVE METABOLIC PANEL
ALT: 10 U/L (ref 0–44)
AST: 13 U/L — ABNORMAL LOW (ref 15–41)
Albumin: 1.6 g/dL — ABNORMAL LOW (ref 3.5–5.0)
Alkaline Phosphatase: 95 U/L (ref 38–126)
Anion gap: 7 (ref 5–15)
BUN: 10 mg/dL (ref 8–23)
CO2: 28 mmol/L (ref 22–32)
Calcium: 8.1 mg/dL — ABNORMAL LOW (ref 8.9–10.3)
Chloride: 102 mmol/L (ref 98–111)
Creatinine, Ser: 2.55 mg/dL — ABNORMAL HIGH (ref 0.44–1.00)
GFR, Estimated: 19 mL/min — ABNORMAL LOW (ref >60)
Glucose, Bld: 96 mg/dL (ref 70–99)
Potassium: 3.4 mmol/L — ABNORMAL LOW (ref 3.5–5.1)
Sodium: 137 mmol/L (ref 135–145)
Total Bilirubin: 0.9 mg/dL (ref 0.3–1.2)
Total Protein: 5.6 g/dL — ABNORMAL LOW (ref 6.5–8.1)

## 2021-08-21 LAB — SEDIMENTATION RATE: Sed Rate: 98 mm/hr — ABNORMAL HIGH (ref 0–22)

## 2021-08-21 LAB — C-REACTIVE PROTEIN: CRP: 19.9 mg/dL — ABNORMAL HIGH (ref <1.0)

## 2021-08-21 LAB — PROCALCITONIN: Procalcitonin: 1.46 ng/mL

## 2021-08-21 MED ORDER — CHLORHEXIDINE GLUCONATE CLOTH 2 % EX PADS
6.0000 | MEDICATED_PAD | Freq: Every day | CUTANEOUS | Status: DC
  Administered 2021-08-22 – 2021-09-10 (×19): 6 via TOPICAL

## 2021-08-21 NOTE — Progress Notes (Addendum)
[name] PROGRESS NOTE  Amy [name] DSK:[id number] DOB: 1946-02-20 DOA: 05/19/2021 PCP: Amy Maw, MD     Status:  Remains inpatient appropriate because: Unsafe d/c plan, IV treatments appropriate due to intensity of illness or inability to take PO, and Inpatient level of care appropriate due to severity of illness patient continues to require dialysis as requested by family and until she is able to transition to the standards of outpatient dialysis she must remain in the hospital.  Barriers to discharge:  Patient needs to be able to tolerate transport to an outpatient dialysis center.  She would either need to be able to tolerate sitting up in a wheelchair for transport to the center or via stretcher.  She needs to demonstrate tolerance to sitting in recliner for 3-1/2 to 4 hours.   Level of care:  Med-Surg  Code Status: DNR Family communication: Spoke with patient's son by telephone on 10/19.  10/27: Attending physician attempted to speak with patient's son but unable to get answer so left voicemail DVT prophylaxis: Eliquis COVID vaccination status: Amy [name] 02/26/2020, [place] 04/10/2021    HPI: 75 y.o. 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 Patient presented to the ED on 7/25 with dark stool.  On presentation, hemoglobin was 9.6; GI was consulted.  She underwent EGD which was normal and flexible sigmoidoscopy which showed nonbleeding distal rectal ulcers/stercoral ulcers.  Subsequently, GI signed off.  Neurology was also consulted for altered mental status; MRI brain was similar to prior MRI at [facility] with a large right MCA stroke.  EEG this admission  unremarkable. PEG tube was desired by family but could not be placed because of overlying transverse colon.  General surgery was consulted for G-tube placement. Patient deemed high risk for open placement of gastrostomy tube.  Subsequently she has suffered another stroke which precludes her from any surgical procedures including open G-tube placement.  This was discussed with her son who was in agreement.  Unfortunately he was more conflicted about stopping dialysis noting prior to the stroke and subsequently since that time each time he is asked his mother regarding stopping dialysis she has told him that she does not want to die and wants to continue dialysis.  Consideration given to transitioning to residential hospice once family in agreement about how to proceed with end-of-life issues.  Patient did have an episode of decline immediately following dialysis on 10/20 but subsequently recovered.  She continues not to eat and intermittently takes medications by mouth.  At this point since patient remains relatively stable despite not eating plan is to determine if she is able to tolerate dialysis in a chair which is a requirement for outpatient hemodialysis.  Once this is accomplished we will need to pursue discharge disposition either to home with family caring for patient or to SNF with family paying out-of-pocket for care.   Subjective: Patient awake.  Reporting pain secondary to attempts to obtain labs via peripheral stick.  When asked about neck pain she stated it was much better.  Objective: Vitals:   08/20/21 1636 08/21/21 0459  BP: (!) 169/61 (!) 156/41  Pulse: (!) 102 85  Resp: 16 17  Temp: 99.3 F (37.4 C) 99.1 F (37.3 C)  SpO2: 100% 100%  Intake/Output Summary (Last 24 hours) at 08/21/2021 0809 Last data filed at 08/20/2021 1754 Gross per 24 hour  Intake 30 ml  Output 0 ml  Net 30 ml    Filed Weights   08/15/21 1219 08/18/21 0700 08/18/21 1100  Weight: 79.8 kg 75 kg 75.1  kg    Exam:  Constitutional: Awake reporting neck pain better Respiratory: 1 L O2, no increased work of breathing while supine or at rest.  Respiratory effort unlabored. Cardiovascular: S1-S2, pulse regular and tachycardic, stable bilateral UE edema R>L-inconsistently drinking Ensure supplement Abdomen: LBM 10/25, abdomen soft nontender nondistended.  Bowel sounds +, in the past 24 hours has taken in about 30 cc of fluid and has eaten 0% of meals.  Neurologic: CN 2-12 grossly intact on visual inspection-sensation remains intact, very limited spontaneous movement.  Unable to test strength but appears to be 1/5 x 4 extremity Psychiatric: Awake.  Able to convey that neck pain slightly better when asked.  Assessment/Plan: Acute problems: Sepsis physiology/RLL HCAP/Abn blood cxs c/w contaminant Completed 7 days of Maxipime-White count normalized as of 10/19 but has increased again over the past 2 days with readings of 28,000 and 23,000. Over the past several days patient has had recurrent low-grade fevers and significant leukocytosis has reemerged as above.  We will repeat stat labs today: Electrolytes, CBC, procalcitonin, ESR, CRP.  We will also obtain blood cultures and check stat portable chest x-ray  Failure to thrive with generalized deconditioning in context of neurogenic dysphagia Given acute stroke patient is no longer a candidate for open G-tube placement.  Her son agrees  Discussed with attending physician on 10/25.  Since patient is not a candidate for a G-tube and we are currently not administering IV fluids it is inappropriate to treat hypoglycemia which is occurring secondary to poor oral intake.  Treating this gives family an inaccurate picture of actual clinical status that would occur in context of poor oral intake and malnutrition. Over the past 24 hours patient has consistently taken her medications orally but has only ingested 30 cc of fluid and has not eaten any meaningful  amounts of food. Given recent stroke we will obtain a follow-up swallowing evaluation determine if diet needs to be changed secondary to mechanical dysphagia  Recurrent leukocytosis WBC today down to 16.6, CRP elevated at 19.9 and procalcitonin 146 and ESR elevated at 48 Was obtained and are pending Chest x-ray with stable right pleural effusion without any obvious signs of aspiration pneumonitis Repeat swallowing evaluation revealed no sign of neuromuscular dysphagia although patient continues to exhibit signs of neurogenic dysphagia and no changes in diet recommended per SLP.  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  Given recent and recurrent stroke she is not a candidate for operative placement/open G-tube  Suspected dementia/recent delirium Continue Remeron refused afternoon Effexor on 7/25 but took a.m. dose on 10/26  ESRD on  HD Had transitioned to comfort care per my discussion with son Amy [name].  See below regarding follow-up discussion  10/19 family requested to resume dialysis since they are not ready to stop dialysis and focus on comfort care. Given son's desire to pursue hemodialysis and patient otherwise stable for discharge we need to  begin PT and OT with focus on patient being able to sit up in a recliner for 3-1/2 to 4 hours during dialysis in order to be a candidate for outpatient hemodialysis  Neck pain Continue scheduled low-dose Robaxin IV-this is the least sedating of muscle relaxers and trying to avoid narcotics Patient unable to get IV Robaxin on a.m. of 10/26 due to loss of IV access.  If unable to obtain IV access will change over to oral form  Type 2 diabetes mellitus Episodes of hypoglycemia Hemoglobin A1c 5.1.  Recently checking CBGs and patient without any significant hyperglycemia.  See above regarding discontinuation of CBG checks and treatment of hypoglycemia Continue Neurontin for peripheral neuropathy  Chronic thrombus left upper extremity Continue Eliquis  Stage II mid coccyx pressure injury, present on admission CT pelvis 10/10 without evidence of abscess Continue local wound care-given poor nutrition and is doubtful this wound will ever heal and likely will only get worse due to her concomitant lack of mobility        Data Reviewed:  CBC: Recent Labs  Lab 08/15/21 0841 08/16/21 0417 08/18/21 0730  WBC 17.1* 28.0* 23.5*  HGB 9.4* 8.9* 9.9*  HCT 31.4* 30.0* 32.8*  MCV 94.6 93.5 91.9  PLT 302 277 342     CBG: Recent Labs  Lab 08/19/21 1623 08/19/21 2016 08/20/21 0002 08/20/21 0414 08/20/21 1635  GLUCAP 185* 167* 168* 158* 148*     Scheduled Meds:  apixaban  5 mg Oral BID   Chlorhexidine Gluconate Cloth  6 each Topical Q0600   darbepoetin (ARANESP) injection - DIALYSIS  200 mcg Intravenous Q Fri-HD   feeding supplement  237 mL Oral BID BM   gabapentin  100 mg Oral Q12H   methocarbamol  500 mg Oral TID   mirtazapine  7.5 mg Oral QHS   OLANZapine zydis  2.5 mg Oral QHS   sodium chloride flush  10-40 mL Intracatheter Q12H   venlafaxine  37.5 mg Oral BID     Active Problems:   Essential hypertension   Type 2 diabetes mellitus with chronic kidney disease on chronic  dialysis, with long-term current use of insulin (HCC)   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   ESRD on dialysis ([place])   Dyslipidemia   Stage 2 skin ulcer of sacral region ([place])   Cerebral thrombosis with cerebral infarction   Protein-calorie malnutrition, severe   Acute GI bleeding   Failure to thrive in adult   Rectal bleeding   Delirium due to another medical condition   Neurogenic dysphagia   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

## 2021-08-21 NOTE — TOC Progression Note (Signed)
Transition of Care [facility]) - Progression Note    Patient Details  Name: ASHTYNN BERKE MRN: [id number] Date of Birth: 05-Feb-1946  Transition of Care [facility]) CM/SW [place], RN Phone Number: 08/21/2021, 2:36 PM  Clinical Narrative:    CM and MSW with DTP have spoken with hospital leadership and have asked Dr. Judeth Horn to reach out to the family to discuss care update on this family.  The patient's family have declined Inpatient hospice placement and have asked that dialysis continue for this patient.  The patient has been able to consume only minimal oral intake at this time.  The patient is unable to discharge to home or custodial placement at a nursing home facility due to need for complex care including HD outpatient support.  CM and MSW with DTP will continue to follow the patient for transitions of care needs.   Expected Discharge Plan: [place] Barriers to Discharge: Continued Medical Work up  Expected Discharge Plan and Services Expected Discharge Plan: [place] In-house Referral: Clinical Social Work     Living arrangements for the past 2 months: [place] (Patient came to hospital from [place])                                       Social Determinants of Health (SDOH) Interventions    Readmission Risk Interventions Readmission Risk Prevention Plan 08/04/2021  Transportation Screening Complete  Medication Review [employer]) Complete  PCP or Specialist appointment within 3-5 days of discharge Complete  HRI or Home Care Consult Complete  SW Recovery Care/Counseling Consult Complete  Palliative Care Screening Complete  Skilled Nursing Facility Complete  Some recent data might be hidden

## 2021-08-21 NOTE — Plan of Care (Signed)
Patient seen today, she is a 75 year old patient who is on dialysis Monday Wednesday Friday, essential hypertension, dyslipidemia, s/p stroke requiring tPA administration in May 2022 with residual left-sided hemiparesis, recent prolonged hospitalization at [facility] few months ago with problems with rectal ulcers, she was admitted several weeks ago to [facility] with a reoccurrence of blood in stool.  Work-up revealed distal rectal ulcers.    Patient has remediated extremely weak and frail initially family was interested in pursuing hospice which I think was appropriate, there were conversations about stopping dialysis treatments however family now wants to continue present line of care, patient is DNR undergoing dialysis treatments Monday Wednesday Friday.  She appears extremely weak and frail with dense left-sided hemiparesis, multiple underlying medical issues as documented above, her long-term prognosis appears to be extremely poor and guarded however family wants to pursue present line of care, I think continuing dialysis 3 times a week, looking for appropriate placement to SNF is appropriate.  If she declines further I do not think escalation in care will be in the best interest of patient at all.  I placed a phone call to patient's son [name] on 08/21/2021, phone #[phone], who is the primary decision maker at 12:52 PM left a detailed message.

## 2021-08-21 NOTE — Evaluation (Signed)
Clinical/Bedside Swallow Evaluation Patient Details  Name: Amy [name] MRN: [id number] Date of Birth: 1946-03-30  Today's Date: 08/21/2021 Time: SLP Start Time (ACUTE ONLY): 1140 SLP Stop Time (ACUTE ONLY): 1200 SLP Time Calculation (min) (ACUTE ONLY): 20 min  Past Medical History:  Past Medical History:  Diagnosis Date   Chronic kidney disease    dialysis M-W-F - dialysis cath located in right side of chest   Diabetes mellitus without complication ([place])    type 2 - no meds    HLD (hyperlipidemia)    diet controlled   Hypertension    no meds   Past Surgical History:  Past Surgical History:  Procedure Laterality Date   A/V FISTULAGRAM Left 01/10/2021   Procedure: A/V FISTULAGRAM;  Surgeon: Angelia Mould, MD;  Location: [place] CV [facility];  Service: Cardiovascular;  Laterality: Left;   A/V FISTULAGRAM N/A 01/27/2021   Procedure: A/V FISTULAGRAM;  Surgeon: Waynetta Sandy, MD;  Location: [place] [facility];  Service: Cardiovascular;  Laterality: N/A;   AV FISTULA PLACEMENT Left 09/24/2020   Procedure: LEFT UPPER ARM ARTERIOVENOUS GORTEX  GRAFT;  Surgeon: Elam Dutch, MD;  Location: [place];  Service: Vascular;  Laterality: Left;   BREAST CYST EXCISION Right 2011   cyst removed -neg   ESOPHAGOGASTRODUODENOSCOPY (EGD) WITH PROPOFOL N/A 05/21/2021   Procedure: ESOPHAGOGASTRODUODENOSCOPY (EGD) WITH PROPOFOL;  Surgeon: Jackquline Denmark, MD;  Location: [facility] ENDOSCOPY;  Service: Endoscopy;  Laterality: N/A;   EYE SURGERY Bilateral    cataracts   FLEXIBLE SIGMOIDOSCOPY N/A 05/21/2021   Procedure: FLEXIBLE SIGMOIDOSCOPY;  Surgeon: Jackquline Denmark, MD;  Location: [facility] ENDOSCOPY;  Service: Endoscopy;  Laterality: N/A;   INSERTION OF DIALYSIS CATHETER Right 07/2019   IR FLUORO GUIDE CV LINE LEFT  06/27/2021   IR FLUORO GUIDE CV LINE LEFT  07/02/2021   IR GASTROSTOMY TUBE MOD SED  05/29/2021   IR US GUIDE VASC ACCESS LEFT  06/27/2021   PERIPHERAL VASCULAR BALLOON ANGIOPLASTY   01/10/2021   Procedure: PERIPHERAL VASCULAR BALLOON ANGIOPLASTY;  Surgeon: Angelia Mould, MD;  Location: [place] [facility];  Service: Cardiovascular;;  left AV graft   TUBAL LIGATION     Ultrasound-guided cannulation left upper arm AV graft  01/27/2021   HPI:  Pt is a 75 y.o. female who presented to ER for dark stool with + fecal occult. EGD 7/27: normal without evidence of GI bleed. Palliative care consulted and code status has been changed to DNR. Per EMR, pt's family has noted "physical, fucntional and cognitive decliene over the past several months but dramatic decline in the past 35 days". IR was consulted for PEG placement; unfortunately unable to place on 8/4 due to overlying transverse colon.  There have been ongoing concerns regarding pts oral intake, refusing food and continuing to require Cortrak feeding. Pt has demonstrated cognitive impiarment and has refused PEG tube though it seems needed given plan of care.Pt has been evalauted by SLP on multiple occasionaly during this admission. Since last eval pt has had recurrent XHB:ZJIRCV MRI 10/10 revealed right posterior frontal, posterior temporal, parietal and occipital acute versus subacute infarct. SLP reconsulted due to concern for aspiration.  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 Palliative care following.    Assessment / Plan / Recommendation  Clinical Impression  Pt demonstates no new signs of dysphagia or neuromuscular impairment beyond baseline neurogenic dysaphgia. Pts interest in PO and attention to hunger cues  continues to be diminished; she will accept sips of Ensure or take meds, but has not accepted solids despite being allowed regualr solids. Today pt consumed 6 oz of ensure with consecutive straw sips without signs of aspiration. Oral motor assessment the same as in prior, vocal quality dry. At the end of session pt requested a sip of water which obviously  hit her too quickly and elicited a hard prolonged cough response, which is appropriate. Would not expect any new finding for silent aspiraiton given pts demonstration of in tact protective mechanisms. Liquids could be preventatively thickened, but given little evidence of dysphagia related aspiration this would be an aggressive measure for a pt with already limited oral intake. Thickening may lead to even less oral hydration. Recommend pt continue current diet. SLP will f/u x1 to check for ongoing tolerance of liquids. SLP Visit Diagnosis: Dysphagia, unspecified (R13.10)    Aspiration Risk  Risk for inadequate nutrition/hydration    Diet Recommendation     Liquid Administration via: Cup;Straw Medication Administration: Crushed with puree Supervision: Staff to assist with self feeding Compensations: Slow rate;Small sips/bites;Follow solids with liquid;Minimize environmental distractions Postural Changes: Seated upright at 90 degrees    Other  Recommendations      Recommendations for follow up therapy are one component of a multi-disciplinary discharge planning process, led by the attending physician.  Recommendations may be updated based on patient status, additional functional criteria and insurance authorization.  Follow up Recommendations Skilled Nursing facility      Frequency and Duration min 2x/week  1 week       Prognosis        Swallow Study   General HPI: Pt is a 75 y.o. female who presented to ER for dark stool with + fecal occult. EGD 7/27: normal without evidence of GI bleed. Palliative care consulted and code status has been changed to DNR. Per EMR, pt's family has noted "physical, fucntional and cognitive decliene over the past several months but dramatic decline in the past 35 days". IR was consulted for PEG placement; unfortunately unable to place on 8/4 due to overlying transverse colon.  There have been ongoing concerns regarding pts oral intake, refusing food and  continuing to require Cortrak feeding. Pt has demonstrated cognitive impiarment and has refused PEG tube though it seems needed given plan of care.Pt has been evalauted by SLP on multiple occasionaly during this admission. Since last eval pt has had recurrent HYQ:MVHQIO MRI 10/10 revealed right posterior frontal, posterior temporal, parietal and occipital acute versus subacute infarct. SLP reconsulted due to concern for aspiration.  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 Palliative care following. Type of Study: Bedside Swallow Evaluation Diet Prior to this Study: Regular;Thin liquids Temperature Spikes Noted: No Respiratory Status: Room air History of Recent Intubation: No Behavior/Cognition: Alert Oral Care Completed by SLP: No Oral Cavity - Dentition: Adequate natural dentition Self-Feeding Abilities: Total assist Patient Positioning: Postural control interferes with function;Partially reclined Baseline Vocal Quality: Normal Volitional Cough: Cognitively unable to elicit Volitional Swallow: Unable to elicit    Oral/Motor/Sensory Function     Ice Chips     Thin Liquid Thin Liquid: Within functional limits Presentation: Straw    Nectar Thick Nectar Thick Liquid: Not tested   Honey Thick Honey Thick Liquid: Not tested   Puree Puree: Not tested   Solid     Solid: Impaired Oral Phase Impairments: Impaired mastication;Reduced lingual movement/coordination Oral Phase  Functional Implications: Prolonged oral transit      Nikala Walsworth, Katherene Ponto 08/21/2021,1:38 PM

## 2021-08-21 NOTE — Progress Notes (Addendum)
[name] Progress Note   Subjective:   Seen in room, awake but not talking or interacting much today.   Objective Vitals:   08/20/21 1148 08/20/21 1636 08/21/21 0459 08/21/21 0958  BP: (!) 159/64 (!) 169/61 (!) 156/41 (!) 124/59  Pulse: (!) 106 (!) 102 85 90  Resp: 14 16 17 18   Temp: 98.6 F (37 C) 99.3 F (37.4 C) 99.1 F (37.3 C) 99.1 F (37.3 C)  TempSrc: Axillary Oral Oral Oral  SpO2: 98% 100% 100% 100%  Weight:      Height:       Physical Exam General: Chronically ill appearing female, alert and in NAD Heart: RRR, no murmur Lungs: CTA anteriorly Abdomen: Soft, non-tender, non-distended, +BS Extremities: No edema b/l lower extremities Dialysis Access:  [facility] with dry, intact bandage  Additional Objective Labs: Basic Metabolic Panel: Recent Labs  Lab 08/15/21 0428 08/18/21 0730 08/21/21 0851  NA 136 136 137  K 4.1 3.2* 3.4*  CL 102 97* 102  CO2 26 29 28   GLUCOSE 64* 118* 96  BUN 18 19 10   CREATININE 4.51* 4.37* 2.55*  CALCIUM 8.3* 8.4* 8.1*  PHOS 3.0 2.1*  --    Liver Function Tests: Recent Labs  Lab 08/15/21 0428 08/18/21 0730 08/21/21 0851  AST  --   --  13*  ALT  --   --  10  ALKPHOS  --   --  95  BILITOT  --   --  0.9  PROT  --   --  5.6*  ALBUMIN 1.7* 1.6* 1.6*   No results for input(s): LIPASE, AMYLASE in the last 168 hours. CBC: Recent Labs  Lab 08/15/21 0841 08/16/21 0417 08/18/21 0730 08/21/21 0851  WBC 17.1* 28.0* 23.5* 16.6*  NEUTROABS  --   --   --  13.2*  HGB 9.4* 8.9* 9.9* 8.5*  HCT 31.4* 30.0* 32.8* 27.8*  MCV 94.6 93.5 91.9 89.7  PLT 302 277 342 253   Blood Culture    Component Value Date/Time   SDES BLOOD RIGHT HAND 08/04/2021 0848   SPECREQUEST  08/04/2021 0848    BOTTLES DRAWN AEROBIC AND ANAEROBIC Blood Culture adequate volume   CULT  08/04/2021 0848    NO GROWTH 5 DAYS Performed at [facility], [place] [address], [place], [place] [address]    REPTSTATUS 08/09/2021 FINAL 08/04/2021 0848     Cardiac Enzymes: No results for input(s): CKTOTAL, CKMB, CKMBINDEX, TROPONINI in the last 168 hours. CBG: Recent Labs  Lab 08/19/21 1623 08/19/21 2016 08/20/21 0002 08/20/21 0414 08/20/21 1635  GLUCAP 185* 167* 168* 158* 148*   Iron Studies: No results for input(s): IRON, TIBC, TRANSFERRIN, FERRITIN in the last 72 hours. @lablastinr3 @ Studies/Results: No results found. Medications:   apixaban  5 mg Oral BID   Chlorhexidine Gluconate Cloth  6 each Topical Q0600   darbepoetin (ARANESP) injection - DIALYSIS  200 mcg Intravenous Q Fri-HD   feeding supplement  237 mL Oral BID BM   gabapentin  100 mg Oral Q12H   methocarbamol  500 mg Oral TID   mirtazapine  7.5 mg Oral QHS   OLANZapine zydis  2.5 mg Oral QHS   sodium chloride flush  10-40 mL Intracatheter Q12H   venlafaxine  37.5 mg Oral BID    Dialysis Orders: AF MWF  3h 14min 400/500 62.5kg 3K/2.5 Ca P2 AVG HeRO   Hep none - Mircera 50 q 2 wks  (7/13)  Venofer 50 q wk - Parsabiv 5mg   IV q HD - not on [facility] formulary    Assessment/Plan: AMS/FTT: Mental status that waxes/wanes. She appears very tired and weak. Family had decided on comfort measures and has now decided they would like her to continue HD. Anemia/Rectal bleeding - GI consulted. Hx rectal ulcers.  EGD 7/27 normal, flex sig with mucosal ulceration c/w sterocoral ulcers, non bleeding. Dropped to 6.5 on 10/10. S/p prbcs on 10/10. Hgb now in the 8-9 range. Continue aranesp q Friday. S Leukocytosis -- ?Pneumonia/?Bacteremia: Was on IV abx - per primary. Blood cultures +S. Epidermidis. Resolved. ESRD: Her last dialysis was 08/04/21 prior to initiating comfort measures, resumed 10/20 per family's wishes. 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 AVG clotted 06/27/21, unable to be declotted d/t arm contracture. S/P conversion to tunneled HD catheter  07/02/21.  Hypertension/volume: BP variable, was on midodrine prior to comfort measures being initiated. Fortunately BP is improved. Does not appear volume overloaded and has not been tolerating much UF with HD due to tachycardia.  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 Phos 2.1- will supplement if it drops any lower. Rechecking with HD tomorrow. Correctly calcium slightly high, no VDRA. Parsabiv not on formulary here.  Nutrition: Alb very low. Not a candidate for feeding tube. Poor PO intake. Recent CVA with no evidence of meaningful recovery Psych eval from 9/14 determined that patient has functional mental capacity for medical decision-making. Psych re-consulted 9/23 to evaluate the patient again - at this point, patient does not have the capacity to make medical decisions. Ongoing discussions with patient's son continues Dispo: DNR/DNI. Poor prognosis. Family was interested in hospice and patient was on comfort measures, has now changed their mind and has not been very responsive to palliative care. Patient will need to be strong enough to tolerate dialysis in a recliner before she can be discharged to outpatient.  Anice Paganini, PA-C 08/21/2021, 11:10 AM  [facility] Pager: [phone]  I have seen and examined this patient and agree with plan and assessment in the above note with renal recommendations/intervention highlighted.  Broadus John A Xanthe Couillard,MD 08/21/2021 12:07 PM

## 2021-08-21 NOTE — Evaluation (Signed)
Physical Therapy Evaluation Patient Details Name: Amy [name] MRN: [id number] DOB: 1946-01-22 Today's Date: 08/21/2021  History of Present Illness  Pt is a 75 y.o. female admitted from short-term rehab at SNF on 05/19/21 with dark stool and fecal occult. S/p EGD and sigmoidoscopy which showed stercoral ulcers. Plan for PEG tube placement 8/4. Pt with decline in the past few months with failure to thrive, acute metabolic encephalopathy. 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 PMH includes ESRD (on HD), HTN, CVA (residual L-side hemiparesis).  Clinical Impression  Pt presents to PT with deficits in ROM, strength, power, functional mobility, balance, endurance, cognition, however many of these deficits appear to be chronic in nature and present since at least August 2022 but likely for a longer period of time (no family present to confirm). Pt is able to follow commands with R UE and RLE with increased time, however pt has minimal strength and limited ROM in these extremities currently. Pt demonstrates largely only reflexive movement of L side currently. Pt demonstrates contractures and limited ROM in all limbs and cervical spine. PT does not anticipate significant functional gains in the acute setting at this time, however will keep the patient on ofr trail of 1x/week in an attempt to schedule time for family education on PROM, and to assist with progressing tolerance for sitting upright in an effort to aide in the possibility of pursuing outpatient dialysis. If family is unable to consistently assist in PROM program then PT will likely sign off again as PT is unable to provide consistent PROM services needed for this patient in order to improve ROM due to significant contractures. PT recommends the pt is lifted from bed to recliner at least once daily with assistance of nursing staff, with progression to multiple times daily to aide in improving tolerance for sitting in dialysis recliner.      Recommendations for follow up therapy are one component of a multi-disciplinary discharge planning process, led by the attending physician.  Recommendations may be updated based on patient status, additional functional criteria and insurance authorization.  Follow Up Recommendations Long-term institutional care without follow-up therapy    Assistance Recommended at Discharge Intermittent Supervision/Assistance  Functional Status Assessment Patient has had a recent decline in their functional status and/or demonstrates limited ability to make significant improvements in function in a reasonable and predictable amount of time  Equipment Recommendations  Hospital bed;Other (comment) (hoyer lift)    Recommendations for Other Services       Precautions / Restrictions Precautions Precautions: Fall;Other (comment) Precaution Comments: bladder/bowel incontinence; L-side hemiplegia, L-side inattention; hallucinating Restrictions Weight Bearing Restrictions: No      Mobility  Bed Mobility Overal bed mobility: Needs Assistance Bed Mobility: Rolling Rolling: Total assist;+2 for physical assistance         General bed mobility comments: pt requires significant physical assistance, is able to initiate bending of R knee to roll and does grab railing with PT assist to provide motion and direct hand to rail, otherwise pt does not assist in rolling    Transfers Overall transfer level: Needs assistance                 General transfer comment: totalA hoyer lift transfer Transfer via Lift Equipment: Maximove  Ambulation/Gait                Stairs            Wheelchair Mobility    Modified Rankin (Stroke Patients Only) Modified Rankin (  Stroke Patients Only) Pre-Morbid Rankin Score: Severe disability Modified Rankin: Severe disability     Balance Overall balance assessment: Needs assistance Sitting-balance support: Feet supported Sitting balance-Leahy Scale:  Zero Sitting balance - Comments: pt requires use of pillows to prevent excess rightward lean when seated with back support in recliner Postural control: Right lateral lean                                   Pertinent Vitals/Pain Pain Assessment: Faces Faces Pain Scale: Hurts even more Breathing: normal Negative Vocalization: none Facial Expression: smiling or inexpressive Body Language: relaxed Consolability: no need to console PAINAD Score: 0 Pain Location: L hip, pt often reports pain to light touch in multiple areas across body Pain Descriptors / Indicators: Grimacing (pt unable to describe "it hurts") Pain Intervention(s): Monitored during session    Home Living Family/patient expects to be discharged to:: Skilled nursing facility                   Additional Comments: long term SNF placement    Prior Function Prior Level of Function : Patient poor historian/Family not available             Mobility Comments: Suspect dependent for all mobility based on L-side hemiplegia and associated contractures ADLs Comments: Suspect dependent for all ADL tasks with assist from staff     Hand Dominance        Extremity/Trunk Assessment   Upper Extremity Assessment Upper Extremity Assessment: Defer to OT evaluation    Lower Extremity Assessment RLE Deficits / Details: pt demonstrates 2-/5 knee extension and flexion, is able to wiggle toes however no active ankle PF/DF observed. Hip flexion limited to ~70 degrees active and ~80 degrees AAROM RLE Sensation:  (pain to light touch) LLE Deficits / Details: no active purposeful motion noted other than one flicker of hamstring activation. Pt otherwise only demonstrates reflexive motion. Knee/hip flexion grossly limited with PROM to ~60 degrees each LLE Sensation:  (pain to light touch)    Cervical / Trunk Assessment Cervical / Trunk Assessment: Kyphotic;Other exceptions Cervical / Trunk Exceptions: pt with  right lateral flexion at trunk, cervical spine contracted into right lateral flexion with right rotation.  Communication   Communication: Expressive difficulties (pt often perseverating over certain words)  Cognition Arousal/Alertness: Awake/alert Behavior During Therapy: Anxious Overall Cognitive Status: No family/caregiver present to determine baseline cognitive functioning                                 General Comments: pt perseverates on pain with light touch and mobility, repeating "ouch" often during session. Pt is able to report details about her family, but PT is unable to confirm. Pt seems to have reduced awareness of deficits at this time.        General Comments General comments (skin integrity, edema, etc.): pt incontinent of bowel, assisted in hygiene tasks. Purulent drainage noted from sacral ulcer when changing sacral heart, RN made aware.    Exercises Other Exercises Other Exercises: PT provides passive stretching into left cervical rotation and from right lateral flexion to neutral. Hold for one minute x 2 repetitions Other Exercises: PROM knee/hip flexion/extension bilaterall x 10 reps   Assessment/Plan    PT Assessment Patient needs continued PT services (trial 1x/week for family education and progressing supported sitting tolerance)  PT Problem  List Decreased strength;Decreased range of motion;Decreased activity tolerance;Decreased balance;Decreased mobility;Decreased coordination;Decreased cognition;Decreased knowledge of use of DME;Decreased safety awareness;Decreased knowledge of precautions;Impaired sensation;Impaired tone;Pain       PT Treatment Interventions DME instruction;Functional mobility training;Therapeutic activities;Therapeutic exercise;Balance training;Patient/family education;Manual techniques;Neuromuscular re-education;Cognitive remediation;Wheelchair mobility training    PT Goals (Current goals can be found in the Care Plan section)   Acute Rehab PT Goals Patient Stated Goal: to tolerate sitting up to progress to potential outpatient dialysis PT Goal Formulation: With patient (based on chart review of pt reports to family for continued HDU) Time For Goal Achievement: 09/04/21 Potential to Achieve Goals: Poor    Frequency Min 1X/week (trial for family education and assistance with progression of sitting tolerance)   Barriers to discharge        Co-evaluation PT/OT/SLP Co-Evaluation/Treatment: Yes Reason for Co-Treatment: Complexity of the patient's impairments (multi-system involvement);Necessary to address cognition/behavior during functional activity;For patient/therapist safety PT goals addressed during session: Mobility/safety with mobility;Balance;Strengthening/ROM OT goals addressed during session: ADL's and self-care       AM-PAC PT "6 Clicks" Mobility  Outcome Measure Help needed turning from your back to your side while in a flat bed without using bedrails?: Total Help needed moving from lying on your back to sitting on the side of a flat bed without using bedrails?: Total Help needed moving to and from a bed to a chair (including a wheelchair)?: Total Help needed standing up from a chair using your arms (e.g., wheelchair or bedside chair)?: Total Help needed to walk in hospital room?: Total Help needed climbing 3-5 steps with a railing? : Total 6 Click Score: 6    End of Session Equipment Utilized During Treatment: Oxygen Activity Tolerance: Patient limited by pain Patient left: in chair;with call bell/phone within reach;with chair alarm set Nurse Communication: Need for lift equipment;Mobility status PT Visit Diagnosis: Other abnormalities of gait and mobility (R26.89);Hemiplegia and hemiparesis;Other symptoms and signs involving the nervous system (R29.898) Hemiplegia - Right/Left: Left    Time: 6378-5885 PT Time Calculation (min) (ACUTE ONLY): 55 min   Charges:     PT  Treatments $Therapeutic Activity: 23-37 mins        Zenaida Niece, PT, DPT Acute Rehabilitation Pager: [phone] Office [phone]   Zenaida Niece 08/21/2021, 3:45 PM

## 2021-08-21 NOTE — Evaluation (Signed)
Occupational Therapy Evaluation Patient Details Name: Amy [name] MRN: [id number] DOB: 05/26/1946 Today's Date: 08/21/2021   History of Present Illness 75 y.o. female admitted from short-term rehab at SNF on 05/19/21 with dark stool and fecal occult. S/p EGD and sigmoidoscopy which showed stercoral ulcers. Plan for PEG tube placement 8/4. Pt with decline in the past few months with failure to thrive, acute metabolic encephalopathy. 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 PMH includes ESRD (on HD), HTN, CVA (residual L-side hemiparesis).   Clinical Impression   PTA, pt was at SNF and requiring assistance for ADLs per chart review; no family present to confirm information. Pt with decreased functional use of BUEs, ROM, strength, balance, vision, cognition, and activity tolerance. Pt requiring Total A for ADLs and bed mobility. Use of maximove for transfer to recliner. Will follow acutely to provide family education and established OOB schedule with nursing to increased sitting tolerance in preparation for sitting for dialysis. Continue to recommend dc to long term care for assistance for ADLs and mobility.      Recommendations for follow up therapy are one component of a multi-disciplinary discharge planning process, led by the attending physician.  Recommendations may be updated based on patient status, additional functional criteria and insurance authorization.   Follow Up Recommendations  Long-term institutional care without follow-up therapy    Assistance Recommended at Discharge Frequent or constant Supervision/Assistance  Functional Status Assessment  Patient has had a recent decline in their functional status and/or demonstrates limited ability to make significant improvements in function in a reasonable and predictable amount of time  Equipment Recommendations  Hospital bed;Other (comment) (hoyer lift)    Recommendations for Other Services Other (comment) (Palliative)      Precautions / Restrictions Precautions Precautions: Fall Precaution Comments: bladder/bowel incontinence; L-side hemiplegia, L-side inattention; hallucinating Restrictions Weight Bearing Restrictions: No      Mobility Bed Mobility Overal bed mobility: Needs Assistance Bed Mobility: Rolling Rolling: Total assist         General bed mobility comments: able to grip bed rail when rolled on L side    Transfers Overall transfer level: Needs assistance                 General transfer comment: totalA hoyer lift transfer Transfer via Lift Equipment: Maximove    Balance Overall balance assessment: Needs assistance Sitting-balance support: Feet supported Sitting balance-Leahy Scale: Zero Sitting balance - Comments: pt requires use of pillows to prevent excess rightward lean when seated with back support in recliner Postural control: Right lateral lean                                 ADL either performed or assessed with clinical judgement   ADL Overall ADL's : Needs assistance/impaired Eating/Feeding: Total assistance   Grooming: Total assistance   Upper Body Bathing: Total assistance   Lower Body Bathing: Total assistance;+2 for physical assistance   Upper Body Dressing : Total assistance   Lower Body Dressing: Total assistance;+2 for physical assistance   Toilet Transfer: +2 for safety/equipment;With caregiver independent assisting   Toileting- Clothing Manipulation and Hygiene: Total assistance;+2 for physical assistance   Tub/ Shower Transfer: Total assistance;+2 for physical assistance   Functional mobility during ADLs: Total assistance General ADL Comments: pt total A for pericare during initial eval     Vision Patient Visual Report: Other (comment) (R gaze) Additional Comments: R gaze. Pt unabel to  providei nformation on her vision. When asking her what color the therapist was wear, pt was able to see therapist to provide information      Perception     Praxis      Pertinent Vitals/Pain Pain Assessment: Faces Faces Pain Scale: Hurts even more Pain Location: L hip, pt often reports pain to light touch in multiple areas across body Pain Descriptors / Indicators: Discomfort;Grimacing Pain Intervention(s): Monitored during session;Limited activity within patient's tolerance;Repositioned     Hand Dominance     Extremity/Trunk Assessment Upper Extremity Assessment Upper Extremity Assessment: RUE deficits/detail;LUE deficits/detail RUE Deficits / Details: painful for elbow ROM. Noting increased edema at R upper arm. abel to open and close hand. RUE: Unable to fully assess due to pain LUE Deficits / Details: No active movement noted in LUE, significant tone, flexor/add/IR contracture LUE Coordination: decreased gross motor;decreased fine motor   Lower Extremity Assessment Lower Extremity Assessment: Defer to PT evaluation RLE Deficits / Details: pt demonstrates 2-/5 knee extension and flexion, is able to wiggle toes however no active ankle PF/DF observed. Hip flexion limited to ~70 degrees active and ~80 degrees AAROM RLE Sensation:  (pain to light touch) LLE Deficits / Details: no active purposeful motion noted other than one flicker of hamstring activation. Pt otherwise only demonstrates reflexive motion. Knee/hip flexion grossly limited with PROM to ~60 degrees each LLE Sensation:  (pain to light touch)   Cervical / Trunk Assessment Cervical / Trunk Assessment: Kyphotic;Other exceptions Cervical / Trunk Exceptions: pt with right lateral flexion at trunk, cervical spine contracted into right lateral flexion with right rotation.   Communication Communication Communication: Expressive difficulties (pt often perseverating over certain words)   Cognition Arousal/Alertness: Awake/alert Behavior During Therapy: Anxious Overall Cognitive Status: No family/caregiver present to determine baseline cognitive functioning Area  of Impairment: Awareness                               General Comments: pt perseverates on pain with light touch and mobility, repeating "ouch" often during session. Pt is able to report details about her family, but therapist is unable to confirm. Pt seems to have reduced awareness of deficits at this time.     General Comments  Bowel incontinence during session. Sacral wound wepting heavily and saturating sacral pad; placing new sacral pad after charge RN looking at wound    Exercises Exercises: Other exercises General Exercises - Upper Extremity Elbow Flexion: AAROM;Right;10 reps;Supine Elbow Extension: AAROM;Right;10 reps;Supine Wrist Flexion: AAROM;Right;10 reps;Supine Wrist Extension: AAROM;Right;10 reps;Supine General Exercises - Lower Extremity Heel Slides: 10 reps;Right;Left;PROM Other Exercises Other Exercises: PT provides passive stretching into left cervical rotation and from right lateral flexion to neutral. Hold for one minute x 2 repetitions Other Exercises: PROM knee/hip flexion/extension bilaterall x 10 reps   Shoulder Instructions      Home Living Family/patient expects to be discharged to:: Skilled nursing facility                                 Additional Comments: long term SNF placement      Prior Functioning/Environment Prior Level of Function : Patient poor historian/Family not available             Mobility Comments: Suspect dependent for all mobility based on L-side hemiplegia and associated contractures ADLs Comments: Suspect dependent for all ADL tasks with assist from staff  OT Problem List: Decreased strength;Decreased range of motion;Decreased activity tolerance;Impaired vision/perception;Impaired balance (sitting and/or standing);Decreased coordination;Decreased cognition;Decreased safety awareness;Decreased knowledge of use of DME or AE;Cardiopulmonary status limiting activity;Impaired tone;Pain      OT  Treatment/Interventions: Therapeutic activities;Patient/family education;Therapeutic exercise;DME and/or AE instruction;Self-care/ADL training    OT Goals(Current goals can be found in the care plan section) Acute Rehab OT Goals Patient Stated Goal: Unstated OT Goal Formulation: Patient unable to participate in goal setting Time For Goal Achievement: 09/04/21 Potential to Achieve Goals: Good ADL Goals Pt/caregiver will Perform Home Exercise Program: Increased ROM;Both right and left upper extremity;With minimal assist Additional ADL Goal #1: Pt will tolerate OOB in recliner for 1 hour in preparation for tolerating dialysis Additional ADL Goal #2: Pt caregiver will verbalize understanding of positioning techinques with Min cues  OT Frequency: Min 1X/week   Barriers to D/C:            Co-evaluation PT/OT/SLP Co-Evaluation/Treatment: Yes Reason for Co-Treatment: To address functional/ADL transfers;For patient/therapist safety PT goals addressed during session: Mobility/safety with mobility;Balance;Strengthening/ROM OT goals addressed during session: ADL's and self-care      AM-PAC OT "6 Clicks" Daily Activity     Outcome Measure Help from another person eating meals?: Total Help from another person taking care of personal grooming?: Total Help from another person toileting, which includes using toliet, bedpan, or urinal?: Total Help from another person bathing (including washing, rinsing, drying)?: Total Help from another person to put on and taking off regular upper body clothing?: Total Help from another person to put on and taking off regular lower body clothing?: Total 6 Click Score: 6   End of Session Equipment Utilized During Treatment: Other (comment) (lift) Nurse Communication: Mobility status  Activity Tolerance: Patient tolerated treatment well Patient left: in chair;with call bell/phone within reach  OT Visit Diagnosis: Unsteadiness on feet (R26.81);Other  abnormalities of gait and mobility (R26.89);Muscle weakness (generalized) (M62.81);Feeding difficulties (R63.3);Hemiplegia and hemiparesis;Pain                Time: 2025-4270 OT Time Calculation (min): 63 min Charges:  OT General Charges $OT Visit: 1 Visit OT Evaluation $OT Eval Moderate Complexity: 1 Mod OT Treatments $Self Care/Home Management : 8-22 mins  Jeannine Pennisi MSOT, OTR/L Acute Rehab Pager: [phone] Office: [place] 08/21/2021, 5:48 PM

## 2021-08-22 ENCOUNTER — Inpatient Hospital Stay (HOSPITAL_COMMUNITY): Payer: Medicare Other

## 2021-08-22 DIAGNOSIS — R609 Edema, unspecified: Secondary | ICD-10-CM

## 2021-08-22 DIAGNOSIS — N186 End stage renal disease: Secondary | ICD-10-CM | POA: Diagnosis not present

## 2021-08-22 DIAGNOSIS — I639 Cerebral infarction, unspecified: Secondary | ICD-10-CM | POA: Diagnosis not present

## 2021-08-22 DIAGNOSIS — E43 Unspecified severe protein-calorie malnutrition: Secondary | ICD-10-CM | POA: Diagnosis not present

## 2021-08-22 DIAGNOSIS — F0280 Dementia in other diseases classified elsewhere without behavioral disturbance: Secondary | ICD-10-CM | POA: Diagnosis not present

## 2021-08-22 LAB — RENAL FUNCTION PANEL
Albumin: 1.7 g/dL — ABNORMAL LOW (ref 3.5–5.0)
Anion gap: 8 (ref 5–15)
BUN: 15 mg/dL (ref 8–23)
CO2: 29 mmol/L (ref 22–32)
Calcium: 8.2 mg/dL — ABNORMAL LOW (ref 8.9–10.3)
Chloride: 100 mmol/L (ref 98–111)
Creatinine, Ser: 3.28 mg/dL — ABNORMAL HIGH (ref 0.44–1.00)
GFR, Estimated: 14 mL/min — ABNORMAL LOW (ref >60)
Glucose, Bld: 118 mg/dL — ABNORMAL HIGH (ref 70–99)
Phosphorus: 1.4 mg/dL — ABNORMAL LOW (ref 2.5–4.6)
Potassium: 3.4 mmol/L — ABNORMAL LOW (ref 3.5–5.1)
Sodium: 137 mmol/L (ref 135–145)

## 2021-08-22 LAB — PROCALCITONIN: Procalcitonin: 1.31 ng/mL

## 2021-08-22 LAB — GLUCOSE, CAPILLARY
Glucose-Capillary: 112 mg/dL — ABNORMAL HIGH (ref 70–99)
Glucose-Capillary: 187 mg/dL — ABNORMAL HIGH (ref 70–99)
Glucose-Capillary: 159 mg/dL — ABNORMAL HIGH (ref 70–99)

## 2021-08-22 LAB — CBC
HCT: 28.2 % — ABNORMAL LOW (ref 36.0–46.0)
Hemoglobin: 8.5 g/dL — ABNORMAL LOW (ref 12.0–15.0)
MCH: 27.4 pg (ref 26.0–34.0)
MCHC: 30.1 g/dL (ref 30.0–36.0)
MCV: 91 fL (ref 80.0–100.0)
Platelets: 270 10*3/uL (ref 150–400)
RBC: 3.1 MIL/uL — ABNORMAL LOW (ref 3.87–5.11)
RDW: 19.5 % — ABNORMAL HIGH (ref 11.5–15.5)
WBC: 14.9 10*3/uL — ABNORMAL HIGH (ref 4.0–10.5)
nRBC: 0 % (ref 0.0–0.2)

## 2021-08-22 MED ORDER — INSULIN ASPART 100 UNIT/ML IJ SOLN: 0.0000 [IU] | Freq: Three times a day (TID) | INTRAMUSCULAR | Status: DC

## 2021-08-22 NOTE — Progress Notes (Signed)
RUE venous duplex has been completed.  Preliminary findings messaged to [place], Therapist, sports.  Results can be found under chart review under CV PROC. 08/22/2021 4:50 PM Pravin Perezperez RVT, RDMS

## 2021-08-22 NOTE — Progress Notes (Signed)
   08/22/21 1115  Vitals  Temp 98.2 F (36.8 C)  Temp Source Oral  BP 106/61  BP Location Right Wrist  BP Method Automatic  Patient Position (if appropriate) Lying  Pulse Rate (!) 105  Pulse Rate Source Monitor  Resp 14  Oxygen Therapy  SpO2 100 %  O2 Device Nasal Cannula  O2 Flow Rate (L/min) 2 L/min  Pain Assessment  Pain Scale Faces  Faces Pain Scale 0  Dialysis Weight  Weight  (UTO)  Post-Hemodialysis Assessment  Rinseback Volume (mL) 250 mL  KECN 208 V  Dialyzer Clearance Lightly streaked  Duration of HD Treatment -hour(s) 3.5 hour(s)  Hemodialysis Intake (mL) 500 mL  UF Total -Machine (mL) 1246 mL  Net UF (mL) 746 mL  Tolerated HD Treatment No (Comment)  Post-Hemodialysis Comments tx complete-pt stable  Hemodialysis Catheter Left Subclavian Double lumen Permanent (Tunneled)  Placement Date/Time: 07/02/21 1206   Time Out: Correct patient;Correct site;Correct procedure  Maximum sterile barrier precautions: Hand hygiene;Cap;Sterile gown;Mask;Sterile gloves;Large sterile sheet  Site Prep: Chlorhexidine (preferred);Skin Prep C...  Site Condition No complications  Blue Lumen Status Flushed;Heparin locked;Dead end cap in place  Red Lumen Status Flushed;Heparin locked;Dead end cap in place  Catheter fill solution Heparin 1000 units/ml  Catheter fill volume (Arterial) 1.9 cc  Catheter fill volume (Venous) 1.9  Dressing Type Transparent  Dressing Status Clean;Dry;Intact  Antimicrobial disc in place? Yes  Dressing Change Due 08/27/21  Post treatment catheter status Capped and Clamped  HD tx complete-pt stable. UF goal not met due to intermittent hypotension, pt asymptomatic. Samantha Collins-PA made aware.

## 2021-08-22 NOTE — Plan of Care (Signed)
  Problem: Health Behavior/Discharge Planning: Goal: Ability to manage health-related needs will improve 08/22/2021 0530 by Jacqualine Code, RN Outcome: Progressing 08/22/2021 0454 by Jacqualine Code, RN Outcome: Progressing   Problem: Clinical Measurements: Goal: Ability to maintain clinical measurements within normal limits will improve Outcome: Progressing   Problem: Activity: Goal: Risk for activity intolerance will decrease Outcome: Progressing   Problem: Nutrition: Goal: Adequate nutrition will be maintained 08/22/2021 0530 by Jacqualine Code, RN Outcome: Progressing 08/22/2021 0454 by Jacqualine Code, RN Outcome: Progressing   Problem: Skin Integrity: Goal: Risk for impaired skin integrity will decrease 08/22/2021 0530 by Jacqualine Code, RN Outcome: Progressing 08/22/2021 0454 by Jacqualine Code, RN Outcome: Progressing

## 2021-08-22 NOTE — Progress Notes (Addendum)
[facility] Progress Note   Subjective:   Pt seen on HD, denies CP, palpitations, dizziness, GI upset. No new concerns today.   Objective Vitals:   08/22/21 0446 08/22/21 0724 08/22/21 0739 08/22/21 0745  BP: (!) 107/49 130/61 (!) 143/68 139/68  Pulse: 94 93 93 90  Resp: 16 19    Temp: 98.8 F (37.1 C) 98.7 F (37.1 C)    TempSrc: Oral Oral    SpO2: 100% 100%    Weight:      Height:       Physical Exam General: Ill appearing, elderly female in NAD Heart: RRR, no murmur Lungs: CTA anteriorly Abdomen: Soft, non-tender, non-distended +BS Extremities: trace edema bilateral upper extremities Dialysis Access: [facility] accessed  Additional Objective Labs: Basic Metabolic Panel: Recent Labs  Lab 08/18/21 0730 08/21/21 0851  NA 136 137  K 3.2* 3.4*  CL 97* 102  CO2 29 28  GLUCOSE 118* 96  BUN 19 10  CREATININE 4.37* 2.55*  CALCIUM 8.4* 8.1*  PHOS 2.1*  --    Liver Function Tests: Recent Labs  Lab 08/18/21 0730 08/21/21 0851  AST  --  13*  ALT  --  10  ALKPHOS  --  95  BILITOT  --  0.9  PROT  --  5.6*  ALBUMIN 1.6* 1.6*   No results for input(s): LIPASE, AMYLASE in the last 168 hours. CBC: Recent Labs  Lab 08/15/21 0841 08/16/21 0417 08/18/21 0730 08/21/21 0851  WBC 17.1* 28.0* 23.5* 16.6*  NEUTROABS  --   --   --  13.2*  HGB 9.4* 8.9* 9.9* 8.5*  HCT 31.4* 30.0* 32.8* 27.8*  MCV 94.6 93.5 91.9 89.7  PLT 302 277 342 253   Blood Culture    Component Value Date/Time   SDES BLOOD RIGHT HAND 08/04/2021 0848   SPECREQUEST  08/04/2021 0848    BOTTLES DRAWN AEROBIC AND ANAEROBIC Blood Culture adequate volume   CULT  08/04/2021 0848    NO GROWTH 5 DAYS Performed at [facility], [place] [address] [place], [place] [address]    REPTSTATUS 08/09/2021 FINAL 08/04/2021 0848    Cardiac Enzymes: No results for input(s): CKTOTAL, CKMB, CKMBINDEX, TROPONINI in the last 168 hours. CBG: Recent Labs  Lab 08/19/21 2016 08/20/21 0002  08/20/21 0414 08/20/21 1635 08/22/21 0619  GLUCAP 167* 168* 158* 148* 112*   Iron Studies: No results for input(s): IRON, TIBC, TRANSFERRIN, FERRITIN in the last 72 hours. @lablastinr3 @ Studies/Results: DG CHEST PORT 1 VIEW  Result Date: 08/21/2021 CLINICAL DATA:  Fever EXAM: PORTABLE CHEST 1 VIEW COMPARISON:  Chest x-ray 08/04/2021 FINDINGS: Heart size is normal. Mediastinum appears stable. Calcified plaques in the aortic arch. Left-sided central line is stable with the tip in the SVC. Elevated right hemidiaphragm with hazy opacities at the right lung base and blunting of the right costophrenic angle. Minor likely subsegmental atelectasis at the left lung base. No pneumothorax. IMPRESSION: Small right pleural effusion with associated atelectasis/infiltrate similar to previous study. Chronic elevated right hemidiaphragm. Electronically Signed   By: Ofilia Neas M.D.   On: 08/21/2021 11:26   Medications:   apixaban  5 mg Oral BID   Chlorhexidine Gluconate Cloth  6 each Topical Q0600   darbepoetin (ARANESP) injection - DIALYSIS  200 mcg Intravenous Q Fri-HD   feeding supplement  237 mL Oral BID BM   gabapentin  100 mg Oral Q12H   methocarbamol  500 mg Oral TID   mirtazapine  7.5 mg Oral QHS  OLANZapine zydis  2.5 mg Oral QHS   sodium chloride flush  10-40 mL Intracatheter Q12H   venlafaxine  37.5 mg Oral BID    Dialysis Orders: AF MWF  3h 78min 400/500 62.5kg 3K/2.5 Ca P2 AVG HeRO   Hep none - Mircera 50 q 2 wks  (7/13)  Venofer 50 q wk - Parsabiv 5mg  IV q HD - not on [facility] formulary  Assessment/Plan: AMS/FTT: Mental status that waxes/wanes. She appears very tired and weak. Family had decided on comfort measures and has now decided they would like her to continue HD. PT evaluating.  Anemia/Rectal bleeding - GI consulted. Hx rectal ulcers.  EGD 7/27 normal, flex sig with mucosal ulceration c/w sterocoral ulcers, non bleeding. Dropped to 6.5 on 10/10. S/p prbcs on 10/10. Hgb now in  the 8-9 range. Continue aranesp q Friday.  Leukocytosis -- ?Pneumonia/?Bacteremia: Was on IV abx - per primary. Blood cultures +S. Epidermidis. Resolved. ESRD: Her last dialysis was 08/04/21 prior to initiating comfort measures, resumed 10/20 per family's wishes. 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 AVG clotted 06/27/21, unable to be declotted d/t arm contracture. S/P conversion to tunneled HD catheter 07/02/21.  Hypertension/volume: BP variable, was on midodrine prior to comfort measures being initiated. Getting some edema in her upper extremities but has not been tolerating much UF with HD due to tachycardia. Attempting UF again today.  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 Phos 2.1- will supplement if it drops any lower. Rechecking with HD today. Correctly calcium slightly high, no VDRA. Parsabiv not on formulary here.  Nutrition: Alb very low. Not a candidate for feeding tube. Poor PO intake. Recent CVA with no evidence of meaningful recovery Psych eval from 9/14 determined that patient has functional mental capacity for medical decision-making. Psych re-consulted 9/23 to evaluate the patient again - at this point, patient does not have the capacity to make medical decisions. Ongoing discussions with patient's son continues Dispo: DNR/DNI. Poor prognosis. Family was interested in hospice and patient was on comfort measures, has now changed their mind and has not been very responsive to palliative care. Patient will need to be strong enough to tolerate dialysis in a recliner before she can be discharged to outpatient.    Anice Paganini, PA-C 08/22/2021, 7:51 AM  [facility] Pager: [phone]  I have seen and examined this patient and agree with plan and assessment in the above note with renal recommendations/intervention highlighted.  Despite her inability to  transfer and poor overall prognosis she is able to tolerate IHD from an hemodynamic standpoint.  TOC CM/SW remain actively involved and have multiple obstacles to discharge at this time.  Hospital leadership has also been engaged.  Difficult situation but will continue with IHD for now.  If things were to change and she no longer tolerates dialysis, then would have to transition to comfort measures.  She was able to answer questions today but didn't think she has had dialysis "in a week".  She was able to verbalize the desire to live and continue with dialysis to Dr. Candiss Norse.  We may want to try HD in recliner with the use of a Hoyer lift on Monday. Broadus John A Tylena Prisk,MD 08/22/2021 8:14 AM

## 2021-08-22 NOTE — Plan of Care (Deleted)
  Problem: Health Behavior/Discharge Planning: Goal: Ability to manage health-related needs will improve Outcome: Progressing   Problem: Clinical Measurements: Goal: Ability to maintain clinical measurements within normal limits will improve Outcome: Progressing   Problem: Nutrition: Goal: Adequate nutrition will be maintained Outcome: Progressing   Problem: Skin Integrity: Goal: Risk for impaired skin integrity will decrease Outcome: Progressing

## 2021-08-22 NOTE — Progress Notes (Signed)
[name] PROGRESS NOTE  SHEYLIN SCHARNHORST LZJ:[id number] DOB: 1946-06-22 DOA: 05/19/2021 PCP: [name], MD                                        10/28 trying to smile during hemodialysis treatment difficult due to positioning in bed   Status:  Remains inpatient appropriate because: Unsafe d/c plan, IV treatments appropriate due to intensity of illness or inability to take PO, and Inpatient level of care appropriate due to severity of illness patient continues to require dialysis as requested by family and until she is able to transition to the standards of outpatient dialysis she must remain in the hospital.  Barriers to discharge:  Patient needs to be able to tolerate transport to an outpatient dialysis center.  She would either need to be able to tolerate sitting up in a wheelchair for transport to the center or via stretcher.  She needs to demonstrate tolerance to sitting in recliner for 3-1/2 to 4 hours. Given her refusal to eat institutional food and her preference for home-cooked meals best discharge option would be home with family for 24/7 care if they are able to obtain private pay home health services   Level of care:  Med-Surg  Code Status: DNR Family communication: 10/28 updated both son and goddaughter [name] by telephone today. DVT prophylaxis: Eliquis COVID vaccination status: [name] 02/26/2020, Pfizer 04/10/2021    HPI: 75 y.o. 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 Patient presented to the ED on 7/25 with dark stool.  On presentation, hemoglobin was 9.6; GI was consulted.  She underwent EGD which was normal and flexible sigmoidoscopy which  showed nonbleeding distal rectal ulcers/stercoral ulcers.  Subsequently, GI signed off.  Neurology was also consulted for altered mental status; MRI brain was similar to prior MRI at [facility] with a large right MCA stroke.  EEG this admission unremarkable. PEG tube was desired by family but could not be placed because of overlying transverse colon.  General surgery was consulted for G-tube placement. Patient deemed high risk for open placement of gastrostomy tube.  Subsequently she has suffered another stroke which precludes her from any surgical procedures including open G-tube placement.  This was discussed with her son who was in agreement.  Unfortunately he was more conflicted about stopping dialysis noting prior to the stroke and subsequently since that time each time he is asked his mother regarding stopping dialysis she has told him that she does not want to die and wants to continue dialysis.  Consideration given to transitioning to residential hospice once family in agreement about how to proceed with end-of-life issues.  Patient did have an episode of decline immediately following dialysis on 10/20 but subsequently recovered.  She continues not to eat and intermittently takes medications by mouth.  At this point since patient remains relatively stable despite not eating plan is to determine if she is able to tolerate dialysis in a chair which is a requirement for outpatient hemodialysis.  Once this is accomplished we will need to pursue discharge disposition either to home with family caring for patient or to SNF with family paying out-of-pocket  for care.   Subjective: Awake and alert in dialysis.  States her back is hurting but her neck pain is much better.  Objective: Vitals:   08/22/21 0739 08/22/21 0745  BP: (!) 143/68 139/68  Pulse: 93 90  Resp:    Temp:    SpO2:      Intake/Output Summary (Last 24 hours) at 08/22/2021 0810 Last data filed at 08/22/2021 0000 Gross per 24 hour  Intake  0 ml  Output 0 ml  Net 0 ml    Filed Weights   08/15/21 1219 08/18/21 0700 08/18/21 1100  Weight: 79.8 kg 75 kg 75.1 kg    Exam:  Constitutional: Awake and alert during dialysis.  No acute distress Respiratory: 1 L O2, lungs remain clear to auscultation anteriorly somewhat diminished in the bases.  No increased work of breathing and no coughing Cardiovascular: S1-S2, pulse regular and tachycardic, stable bilateral UE edema R>L with significant RUE edema primarily located in the upper arm above the elbow. Abdomen: LBM 10/25, abdomen soft nontender nondistended.  Bowel sounds +, over the past 24 hours 0 oral intake including fluids and meal Neurologic: CN 2-12 grossly intact on visual inspection-sensation remains intact, very limited spontaneous movement.  Unable to test strength but appears to be 1/5 x 4 extremity Psychiatric: Awake.  Able to convey that neck pain slightly better when asked.  Assessment/Plan: Acute problems: Sepsis physiology/RLL HCAP/Abn blood cxs c/w contaminant Completed 7 days of Maxipime-White count normalized as of 10/19 but has increased again over the past 2 days with readings of 28,000 and 23,000. Due to recurrent fevers and leukocytosis infectious work-up initiated.  Blood cultures are pending.  Labs otherwise unremarkable.  White count continues to trend down.  CRP remains mildly elevated with normal procalcitonin.  ESR also elevated and suspect these inflammatory markers are related due to ongoing dehydration.  Chest x-ray revealed persistent right pleural effusion that was stable but no evidence of pneumonia  Failure to thrive with generalized deconditioning in context of neurogenic dysphagia Given acute stroke patient is no longer a candidate for open G-tube placement.  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-recurrent acute on subacute stroke Recent MRI 10/10 revealed right posterior frontal, posterior temporal, parietal and occipital acute versus subacute infarct Son updated and initially agreeable to proceeding with hospice care-HD discontinued based on that conversation but family decided to resume hemodialysis on 10/19 Continue Eliquis   RUE edema For the most part has been chronic but seems worse and currently is not involving the forearm Patient did have inconsistent Eliquis dosing for a few days last week to refusal to take medications therefore will obtain venous duplex to rule out DVT She is currently receiving full dose anticoagulation   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  Given recent and recurrent stroke she is not a candidate for operative placement/open G-tube  Suspected dementia/recent delirium Continue Remeron and Effexor  ESRD on HD Continues to tolerate dialysis with intermittent episodes  of tachycardia likely related to dehydration from poor oral intake Currently have initiated therapies and nursing interventions to promote ability to sit up in chair so can tolerate outpatient hemodialysis  Neck pain Continue scheduled low-dose Robaxin IV-this is the least sedating of muscle relaxers and trying to avoid narcotics Patient unable to get IV Robaxin on a.m. of 10/26 due to loss of IV access.  If unable to obtain IV access will change over to oral form  Type 2 diabetes mellitus Episodes of hypoglycemia Hemoglobin A1c  5.1.  Recently checking CBGs and patient without any significant hyperglycemia.  See above regarding discontinuation of CBG checks and treatment of hypoglycemia Continue Neurontin for peripheral neuropathy  Chronic thrombus left upper extremity Continue Eliquis  Stage II mid coccyx pressure injury, present on admission CT pelvis 10/10 without evidence of abscess Continue local wound care-given poor nutrition and is doubtful this wound will ever heal and likely will only get worse due to her concomitant lack of mobility        Data Reviewed:  CBC: Recent Labs  Lab 08/15/21 0841 08/16/21 0417 08/18/21 0730 08/21/21 0851 08/22/21 0618  WBC 17.1* 28.0* 23.5* 16.6* 14.9*  NEUTROABS  --   --   --  13.2*  --   HGB 9.4* 8.9* 9.9* 8.5* 8.5*  HCT 31.4* 30.0* 32.8* 27.8* 28.2*  MCV 94.6 93.5 91.9 89.7 91.0  PLT 302 277 342 253 270     CBG: Recent Labs  Lab 08/19/21 2016 08/20/21 0002 08/20/21 0414 08/20/21 1635 08/22/21 0619  GLUCAP 167* 168* 158* 148* 112*     Scheduled Meds:  apixaban  5 mg Oral BID   Chlorhexidine Gluconate Cloth  6 each Topical Q0600   darbepoetin (ARANESP) injection - DIALYSIS  200 mcg Intravenous Q Fri-HD   feeding supplement  237 mL Oral BID BM   gabapentin  100 mg Oral Q12H   methocarbamol  500 mg Oral TID   mirtazapine  7.5 mg Oral QHS   OLANZapine zydis  2.5 mg Oral QHS   sodium chloride flush  10-40 mL Intracatheter Q12H   venlafaxine  37.5 mg Oral BID     Active Problems:   Essential hypertension   Type 2 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, with long-term current use of insulin (HCC)   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   ESRD on dialysis ([place])   Dyslipidemia   Stage 2 skin ulcer of sacral region ([place])   Cerebral thrombosis with cerebral infarction   Protein-calorie malnutrition, severe   Acute GI bleeding   Failure to thrive in adult   Rectal bleeding   Delirium due to another  medical condition   Neurogenic dysphagia   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

## 2021-08-23 DIAGNOSIS — K922 Gastrointestinal hemorrhage, unspecified: Secondary | ICD-10-CM | POA: Diagnosis not present

## 2021-08-23 LAB — GLUCOSE, CAPILLARY
Glucose-Capillary: 122 mg/dL — ABNORMAL HIGH (ref 70–99)
Glucose-Capillary: 138 mg/dL — ABNORMAL HIGH (ref 70–99)
Glucose-Capillary: 112 mg/dL — ABNORMAL HIGH (ref 70–99)

## 2021-08-23 LAB — PROCALCITONIN: Procalcitonin: 1.37 ng/mL

## 2021-08-23 MED ORDER — K PHOS MONO-SOD PHOS DI & MONO 155-852-130 MG PO TABS
250.0000 mg | ORAL_TABLET | Freq: Two times a day (BID) | ORAL | Status: AC
  Administered 2021-08-24 – 2021-08-25 (×3): 250 mg via ORAL
  Filled 2021-08-23 (×6): qty 1

## 2021-08-23 NOTE — Progress Notes (Addendum)
Subjective: Seen in room said tolerated dialysis yesterday no voiced complaints  Objective Vital signs in last 24 hours: Vitals:   08/22/21 1730 08/22/21 2024 08/23/21 0540 08/23/21 0943  BP: 129/87 (!) 151/68 136/83 (!) 133/49  Pulse: 92 95 85 96  Resp: 17 18 18 18   Temp: 98.3 F (36.8 C) 98.5 F (36.9 C) 98 F (36.7 C) 98.2 F (36.8 C)  TempSrc: Oral Oral Axillary   SpO2: 93% 100% 98% 100%  Weight:  93.4 kg    Height:       Weight change:   Physical Exam: General: Alert chronically ill-appearing elderly female NAD Heart: RRR no MRG Lungs: CTA nonlabored breathing Abdomen: NABS soft NT ND Extremities: Trace bilateral lower extremity edema, Dialysis Access: [facility]  Dialysis Orders: AF MWF  3h 33min 400/500 62.5kg 3K/2.5 Ca P2 AVG HeRO   Hep none - Mircera 50 q 2 wks  (7/13)  Venofer 50 q wk - Parsabiv 5mg  IV q HD - not on [facility] formulary   Assessment/Plan: AMS/FTT: Mental status waxes/wanes.  Family had decided on comfort measures and has now decided they would like her to continue HD. PT evaluating.  Anemia/Rectal bleeding - GI consulted. Hx rectal ulcers.  EGD 7/27 normal, flex sig with mucosal ulceration c/w sterocoral ulcers, non bleeding. Dropped to 6.5 on 10/10. S/p prbcs on 10/10. Hgb now in the 8-9 range. Continue aranesp q Friday.  Leukocytosis -- ?Pneumonia/?Bacteremia: Was on IV abx - per primary. Blood cultures +S. Epidermidis.  WBC 14.9 on 10/28 monitor ESRD: Her last HD= 08/04/21 prior to initiating comfort measures, resumed 10/20 per family's wishes. 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 AVG clotted 06/27/21, unable to be declotted d/t arm contracture. S/P conversion to tunneled HD catheter 07/02/21.  Hypertension/volume: BP variable, was on midodrine prior to comfort measures being initiated. Getting some edema in her upper extremities but  has not been tolerating much UF with HD due to tachycardia.  Not short of breath, 746 UF yesterday HD MBD: Not on a binder. Phos 1.4- will supplement  Correctly calcium slightly high, no VDRA. Parsabiv not on formulary here.  Nutrition: Alb very low 1.7. Not a candidate for feeding tube. Poor PO intake. Recent CVA with no evidence of meaningful recovery Psych eval from 9/14 determined that patient has functional mental capacity for medical decision-making. Psych re-consulted 9/23 to evaluate the patient again - at this point, patient does not have the capacity to make medical decisions. Ongoing discussions with patient's son continues Dispo: DNR/DNI. Poor prognosis. Family was interested in hospice and patient was on comfort measures, has now changed their mind and has not been very responsive to palliative care. Patient will need to be strong enough to tolerate dialysis in a recliner before she can be discharged to outpatient.   Ernest Haber, PA-C [facility] Beeper [phone] 08/23/2021,10:37 AM  LOS: 95 days   Labs: Basic Metabolic Panel: Recent Labs  Lab 08/18/21 0730 08/21/21 0851 08/22/21 0618  NA 136 137 137  K 3.2* 3.4* 3.4*  CL 97* 102 100  CO2 29 28 29   GLUCOSE 118* 96 118*  BUN 19 10 15   CREATININE 4.37* 2.55* 3.28*  CALCIUM 8.4* 8.1* 8.2*  PHOS 2.1*  --  1.4*   Liver Function Tests: Recent Labs  Lab 08/18/21 0730 08/21/21 0851 08/22/21 0618  AST  --  13*  --  ALT  --  10  --   ALKPHOS  --  95  --   BILITOT  --  0.9  --   PROT  --  5.6*  --   ALBUMIN 1.6* 1.6* 1.7*   No results for input(s): LIPASE, AMYLASE in the last 168 hours. No results for input(s): AMMONIA in the last 168 hours. CBC: Recent Labs  Lab 08/18/21 0730 08/21/21 0851 08/22/21 0618  WBC 23.5* 16.6* 14.9*  NEUTROABS  --  13.2*  --   HGB 9.9* 8.5* 8.5*  HCT 32.8* 27.8* 28.2*  MCV 91.9 89.7 91.0  PLT 342 253 270   Cardiac Enzymes: No results for input(s): CKTOTAL,  CKMB, CKMBINDEX, TROPONINI in the last 168 hours. CBG: Recent Labs  Lab 08/22/21 0619 08/22/21 1613 08/22/21 2102 08/23/21 0646 08/23/21 0724  GLUCAP 112* 187* 159* 138* 122*    Studies/Results: VAS [place] UPPER EXTREMITY VENOUS DUPLEX  Result Date: 08/22/2021 UPPER VENOUS STUDY  Patient Name:  Amy [name]  Date of Exam:   08/22/2021 Medical Rec #: [id number]         Accession #:    [id number] Date of Birth: 09/26/1946         Patient Gender: F Patient Age:   75 years Exam Location:  [facility] Procedure:      VAS [place] UPPER EXTREMITY VENOUS DUPLEX Referring Phys: Erin Hearing --------------------------------------------------------------------------------  Indications: Edema Limitations: Poor ultrasound/tissue interface and edema. Comparison Study: No previous RUE exams Performing Technologist: Jody Hill RVT, RDMS  Examination Guidelines: A complete evaluation includes B-mode imaging, spectral Doppler, color Doppler, and power Doppler as needed of all accessible portions of each vessel. Bilateral testing is considered an integral part of a complete examination. Limited examinations for reoccurring indications may be performed as noted.  Right Findings: +----------+------------+---------+-----------+----------+-------+ RIGHT     CompressiblePhasicitySpontaneousPropertiesSummary +----------+------------+---------+-----------+----------+-------+ IJV           Full       Yes       Yes                      +----------+------------+---------+-----------+----------+-------+ Subclavian    Full       Yes       Yes                      +----------+------------+---------+-----------+----------+-------+ Axillary      Full       Yes       Yes                      +----------+------------+---------+-----------+----------+-------+ Brachial      Full       Yes       Yes                      +----------+------------+---------+-----------+----------+-------+ Radial         Full                                          +----------+------------+---------+-----------+----------+-------+ Ulnar         Full                                          +----------+------------+---------+-----------+----------+-------+ Cephalic  None       No        No                Acute  +----------+------------+---------+-----------+----------+-------+ Basilic       Full       Yes       Yes                      +----------+------------+---------+-----------+----------+-------+  Left Findings: +----------+------------+---------+-----------+----------+-------+ LEFT      CompressiblePhasicitySpontaneousPropertiesSummary +----------+------------+---------+-----------+----------+-------+ Subclavian               Yes       Yes                      +----------+------------+---------+-----------+----------+-------+ Subclavian patent by color and doppler - unable to compress due line placement.  Summary:  Right: No evidence of deep vein thrombosis in the upper extremity. Findings consistent with acute superficial vein thrombosis involving the right cephalic vein.  *See table(s) above for measurements and observations.  Diagnosing physician: Jamelle Haring Electronically signed by Jamelle Haring on 08/22/2021 at 4:51:59 PM.    Final    Medications:   apixaban  5 mg Oral BID   Chlorhexidine Gluconate Cloth  6 each Topical Q0600   darbepoetin (ARANESP) injection - DIALYSIS  200 mcg Intravenous Q Fri-HD   feeding supplement  237 mL Oral BID BM   gabapentin  100 mg Oral Q12H   insulin aspart  0-6 Units Subcutaneous TID WC   methocarbamol  500 mg Oral TID   mirtazapine  7.5 mg Oral QHS   OLANZapine zydis  2.5 mg Oral QHS   sodium chloride flush  10-40 mL Intracatheter Q12H   venlafaxine  37.5 mg Oral BID    I have seen and examined this patient and agree with plan and assessment in the above note with renal recommendations/intervention highlighted. She has been able  to tolerate IHD hemodynamically so will attempt HD in recliner on Monday to help facilitate outpatient HD arrangements, or may require stretcher dialysis as an outpatient.  She was very clear that she wants to continue with dialysis from our discussion 08/22/21.  Broadus John A Amilah Greenspan,MD 08/23/2021 10:54 AM

## 2021-08-23 NOTE — Progress Notes (Signed)
[name] PROGRESS NOTE  Amy [name] LZJ:[id number] DOB: 1946-06-22 DOA: 05/19/2021 PCP: [name], MD                                        10/28 trying to smile during hemodialysis treatment difficult due to positioning in bed   Status:  Remains inpatient appropriate because: Unsafe d/c plan, IV treatments appropriate due to intensity of illness or inability to take PO, and Inpatient level of care appropriate due to severity of illness patient continues to require dialysis as requested by family and until she is able to transition to the standards of outpatient dialysis she must remain in the hospital.  Barriers to discharge:  Patient needs to be able to tolerate transport to an outpatient dialysis center.  She would either need to be able to tolerate sitting up in a wheelchair for transport to the center or via stretcher.  She needs to demonstrate tolerance to sitting in recliner for 3-1/2 to 4 hours. Given her refusal to eat institutional food and her preference for home-cooked meals best discharge option would be home with family for 24/7 care if they are able to obtain private pay home health services   Level of care:  Med-Surg  Code Status: DNR Family communication: 10/28 updated both son and goddaughter [name] by telephone today. DVT prophylaxis: Eliquis COVID vaccination status: [name] 02/26/2020, Pfizer 04/10/2021    HPI: 75 y.o. 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 Patient presented to the ED on 7/25 with dark stool.  On presentation, hemoglobin was 9.6; GI was consulted.  She underwent EGD which was normal and flexible sigmoidoscopy which  showed nonbleeding distal rectal ulcers/stercoral ulcers.  Subsequently, GI signed off.  Neurology was also consulted for altered mental status; MRI brain was similar to prior MRI at [facility] with a large right MCA stroke.  EEG this admission unremarkable. PEG tube was desired by family but could not be placed because of overlying transverse colon.  General surgery was consulted for G-tube placement. Patient deemed high risk for open placement of gastrostomy tube.  Subsequently she has suffered another stroke which precludes her from any surgical procedures including open G-tube placement.  This was discussed with her son who was in agreement.  Unfortunately he was more conflicted about stopping dialysis noting prior to the stroke and subsequently since that time each time he is asked his mother regarding stopping dialysis she has told him that she does not want to die and wants to continue dialysis.  Consideration given to transitioning to residential hospice once family in agreement about how to proceed with end-of-life issues.  Patient did have an episode of decline immediately following dialysis on 10/20 but subsequently recovered.  She continues not to eat and intermittently takes medications by mouth.  At this point since patient remains relatively stable despite not eating plan is to determine if she is able to tolerate dialysis in a chair which is a requirement for outpatient hemodialysis.  Once this is accomplished we will need to pursue discharge disposition either to home with family caring for patient or to SNF with family paying out-of-pocket  for care.   Subjective: Awake and alert in dialysis.  States her back is hurting but her neck pain is much better.  Objective: Vitals:   08/23/21 0540 08/23/21 0943  BP: 136/83 (!) 133/49  Pulse: 85 96  Resp: 18 18  Temp: 98 F (36.7 C) 98.2 F (36.8 C)  SpO2: 98% 100%    Intake/Output Summary (Last 24 hours) at 08/23/2021 1059 Last data filed at  08/23/2021 1047 Gross per 24 hour  Intake 240 ml  Output 746 ml  Net -506 ml    Filed Weights   08/18/21 0700 08/18/21 1100 08/22/21 2024  Weight: 75 kg 75.1 kg 93.4 kg    Exam:  Awake Alert to person, dense left-sided weakness, able to answer basic questions, .AT,PERRAL Supple Neck, No JVD,   Symmetrical Chest wall movement, Good air movement bilaterally, CTAB RRR,No Gallops, Rubs or new Murmurs,  +ve B.Sounds, Abd Soft, No tenderness,   No Cyanosis, mild left arm edema    Assessment/Plan: Patient in bed awake denies any headache chest or abdominal pain, no shortness of breath. Sepsis physiology/RLL HCAP/Abn blood cxs c/w contaminant - Completed 7 days of Maxipime-White count normalized as of 10/19 but has increased again over the past 2 days with readings of 28,000 and 23,000.  Mains high risk for aspiration.  Continue to monitor off antibiotics.   Failure to thrive with generalized deconditioning in context of neurogenic dysphagia - Given acute stroke patient very poor quality of life PEG tube is likely not beneficial, she has poor anatomy for IR to place a PEG tube and a very poor candidate for open surgical PEG tube placement, her son agrees , continue oral diet as tolerated prefers home-cooked meals.  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  Given recent and recurrent stroke she is not a candidate for operative placement/open G-tube  Suspected dementia/recent delirium - Continue Remeron and Effexor  ESRD on HD - Continues to tolerate dialysis with intermittent episodes of tachycardia likely related to dehydration from poor oral intake, nephrology following on TTS schedule.  Patient very clear in my interview on 08/22/2021 that she wants to continue dialysis and present line of care, of note she has periods of lucidity, Dr. Marval Regal was bedside on 08/22/2021 when this conversation happened.  Neck pain - Continue scheduled low-dose Robaxin IV-this is the least sedating of muscle relaxers and trying to avoid narcotics  Chronic thrombus left upper extremity - Continue Eliquis  Stage II mid coccyx pressure injury, present on admission - CT pelvis 10/10 without evidence of abscess.  Continue local wound care-given poor nutrition and is doubtful this wound will ever heal and likely will only get worse due to her concomitant lack of mobility   Type 2 diabetes mellitus - Episodes of hypoglycemia, Hemoglobin A1c 5.1. Continue to monitor clinically with limited oral  intake.  CBG (last 3)  Recent Labs    08/22/21 2102 08/23/21 0646 08/23/21 0724  GLUCAP 159* 138* 122*    Lab Results  Component Value Date   HGBA1C 5.1 05/28/2021    Data Reviewed:  CBC: Recent Labs  Lab 08/18/21 0730 08/21/21 0851 08/22/21 0618  WBC 23.5* 16.6* 14.9*  NEUTROABS  --  13.2*  --   HGB 9.9* 8.5* 8.5*  HCT 32.8* 27.8* 28.2*  MCV 91.9 89.7 91.0  PLT 342 253 270     CBG: Recent Labs  Lab 08/22/21 0619 08/22/21 1613 08/22/21 2102 08/23/21 0646 08/23/21 0724  GLUCAP 112* 187* 159* 138* 122*     Scheduled Meds:  apixaban  5 mg Oral BID   Chlorhexidine Gluconate Cloth  6 each Topical Q0600    darbepoetin (ARANESP) injection - DIALYSIS  200 mcg Intravenous Q Fri-HD   feeding supplement  237 mL Oral BID BM   gabapentin  100 mg Oral Q12H   insulin aspart  0-6 Units Subcutaneous TID WC   methocarbamol  500 mg Oral TID   mirtazapine  7.5 mg Oral QHS   OLANZapine zydis  2.5 mg Oral QHS   phosphorus  250 mg Oral BID   sodium chloride flush  10-40 mL Intracatheter Q12H   venlafaxine  37.5 mg Oral BID     Active Problems:   Essential hypertension   Type 2 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, with long-term current use of insulin (HCC)   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   ESRD on dialysis (HCC)   Dyslipidemia   Stage 2 skin ulcer of sacral region ([place])   Cerebral thrombosis with cerebral infarction   Protein-calorie malnutrition, severe   Acute GI bleeding   Failure to thrive in adult   Rectal bleeding   Delirium due to another medical condition   Neurogenic dysphagia   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

## 2021-08-24 DIAGNOSIS — K922 Gastrointestinal hemorrhage, unspecified: Secondary | ICD-10-CM | POA: Diagnosis not present

## 2021-08-24 LAB — GLUCOSE, CAPILLARY
Glucose-Capillary: 62 mg/dL — ABNORMAL LOW (ref 70–99)
Glucose-Capillary: 80 mg/dL (ref 70–99)
Glucose-Capillary: 98 mg/dL (ref 70–99)
Glucose-Capillary: 81 mg/dL (ref 70–99)
Glucose-Capillary: 79 mg/dL (ref 70–99)

## 2021-08-24 MED ORDER — ENSURE ENLIVE PO LIQD
237.0000 mL | Freq: Three times a day (TID) | ORAL | Status: DC
  Administered 2021-08-24 – 2021-09-09 (×34): 237 mL via ORAL

## 2021-08-24 MED ORDER — PREDNISONE 10 MG PO TABS
10.0000 mg | ORAL_TABLET | Freq: Two times a day (BID) | ORAL | Status: DC
  Administered 2021-08-25 – 2021-08-26 (×3): 10 mg via ORAL
  Filled 2021-08-24 (×3): qty 1

## 2021-08-24 NOTE — Plan of Care (Signed)
  Problem: Health Behavior/Discharge Planning: Goal: Ability to manage health-related needs will improve Outcome: Progressing   Problem: Activity: Goal: Risk for activity intolerance will decrease Outcome: Progressing   Problem: Nutrition: Goal: Adequate nutrition will be maintained Outcome: Progressing   Problem: Role Relationship: Goal: Family's ability to cope with current situation will improve Outcome: Progressing

## 2021-08-24 NOTE — Progress Notes (Signed)
Hypoglycemic Event   CBG: 62   Treatment: Pt was given 4oz of  juice   Symptoms: Pt was asymptomatic   Follow-up CBG Time: 0647  CBG Result: 80   Possible Reasons for Event: Pt did not consume adequate nutrition  Comments/MD notified: Dr. Candiss Norse was notified via secure chat.    Will continue to monitor:

## 2021-08-24 NOTE — Progress Notes (Addendum)
Mr.Amy [name] PROGRESS NOTE  KILEY SOLIMINE TTS:[id number] DOB: [birth year] DOA: 05/19/2021 PCP: [name], MD  Status:  Remains inpatient appropriate because: Unsafe d/c plan, IV treatments appropriate due to intensity of illness or inability to take PO, and Inpatient level of care appropriate due to severity of illness patient continues to require dialysis as requested by family and until she is able to transition to the standards of outpatient dialysis she must remain in the hospital.  Barriers to discharge:  Patient needs to be able to tolerate transport to an outpatient dialysis center.  She would either need to be able to tolerate sitting up in a wheelchair for transport to the center or via stretcher.  She needs to demonstrate tolerance to sitting in recliner for 3-1/2 to 4 hours. Given her refusal to eat institutional food and her preference for home-cooked meals best discharge option would be home with family for 24/7 care if they are able to obtain private pay home health services   Level of care:  Med-Surg  Code Status: DNR Family communication: 10/28 updated both son and goddaughter [name] by telephone today. DVT prophylaxis: Eliquis COVID vaccination status: [name] 02/26/2020, Pfizer 04/10/2021    HPI: 75 y.o. 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 Patient presented to the ED on 7/25 with dark stool.  On presentation, hemoglobin was 9.6; GI was consulted.  She underwent EGD which was normal and flexible sigmoidoscopy which showed nonbleeding distal rectal ulcers/stercoral ulcers.  Subsequently, GI signed off.  Neurology was also consulted for altered  mental status; MRI brain was similar to prior MRI at [facility] with a large right MCA stroke.  EEG this admission unremarkable. PEG tube was desired by family but could not be placed because of overlying transverse colon.  General surgery was consulted for G-tube placement. Patient deemed high risk for open placement of gastrostomy tube.  Subsequently she has suffered another stroke which precludes her from any surgical procedures including open G-tube placement.  This was discussed with her son who was in agreement.  Unfortunately he was more conflicted about stopping dialysis noting prior to the stroke and subsequently since that time each time he is asked his mother regarding stopping dialysis she has told him that she does not want to die and wants to continue dialysis.  Consideration given to transitioning to residential hospice once family in agreement about how to proceed with end-of-life issues.  Patient did have an episode of decline immediately following dialysis on 73/20 but subsequently recovered.  She continues not to eat and intermittently takes medications by mouth.  At this point since patient remains relatively stable despite not eating plan is to determine if she is able to tolerate dialysis in a chair which is a requirement for outpatient hemodialysis.  Once this is accomplished we will need to pursue discharge disposition either to home with family caring for patient or to SNF with family paying out-of-pocket for care.   Subjective:  Awake bed appears to be in no distress denies any headache chest or abdominal pain.  Does not want to eat right now says she is not hungry  Objective: Vitals:   08/24/21 0536 08/24/21 0908  BP: (!) 173/54 (!) 122/51  Pulse: 100 96  Resp: 20 14  Temp: 99 F (37.2 C) 98.8 F (37.1 C)  SpO2: 100% 100%    Intake/Output Summary (Last 24 hours) at 08/24/2021 1129 Last data filed at 08/23/2021 1800 Gross per 24 hour  Intake 0 ml  Output 0 ml  Net 0 ml     Filed Weights   08/18/21 0700 08/18/21 1100 08/22/21 2024  Weight: 75 kg 75.1 kg 93.4 kg    Exam:  Awake Alert to place, dense left-sided hemiparesis, appears very weak and frail, [place].AT,PERRAL Supple Neck, No JVD,   Symmetrical Chest wall movement, Good air movement bilaterally, CTAB RRR,No Gallops, Rubs or new Murmurs,  +ve B.Sounds, Abd Soft, No tenderness,   No Cyanosis, Clubbing or edema      Assessment/Plan:  Sepsis physiology/RLL HCAP/Abn blood cxs c/w contaminant - Completed 7 days of Maxipime-White count normalized as of 10/19 but has increased again over the past 2 days with readings of 28,000 and 23,000.  Mains high risk for aspiration.  Continue to monitor off antibiotics.   Failure to thrive with generalized deconditioning in context of neurogenic dysphagia - Given acute stroke patient very poor quality of life PEG tube is likely not beneficial, she has poor anatomy for IR to place a PEG tube and a very poor candidate for open surgical PEG tube placement, her son agrees , continue oral diet as tolerated prefers home-cooked meals.  Due to intermittent hypoglycemia placed on low-dose twice daily prednisone.  This is not a permanent strategy however will allow [place] to obtain some euglycemia.  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  Given recent and recurrent stroke she is not a candidate for operative placement/open G-tube  Suspected dementia/recent delirium - Continue Remeron and Effexor  ESRD on HD - Continues to tolerate dialysis with intermittent episodes of tachycardia likely related to dehydration from poor oral intake, nephrology following on TTS schedule.  Patient very clear in my interview on 08/22/2021 that she wants to continue dialysis and present line of care, of note she has periods of lucidity, Dr. Marval Regal was bedside on 08/22/2021 when this conversation happened.  Neck pain - Continue scheduled low-dose Robaxin IV-this is the least sedating of muscle relaxers and trying to avoid narcotics  Chronic thrombus left upper extremity - Continue Eliquis  Stage II mid coccyx pressure injury, present on admission - CT pelvis 10/10 without evidence of abscess.  Continue local wound care-given poor nutrition and is doubtful this wound will ever heal and likely will only get worse due to her concomitant lack of mobility   Type 2 diabetes mellitus - Episodes of hypoglycemia, Hemoglobin A1c 5.1. Continue to monitor clinically with limited oral intake.  CBG (last 3)  Recent Labs    08/23/21 1112 08/24/21 0621 08/24/21 0647  GLUCAP 112* 62* 80    Lab Results  Component  Value Date   HGBA1C 5.1 05/28/2021      Overall prognosis and long-term plan - 75 year old patient who is on dialysis Monday Wednesday Friday, essential hypertension, dyslipidemia, s/p stroke requiring tPA administration in May 2022 with residual left-sided hemiparesis, recent prolonged hospitalization at [facility] few months ago with problems with rectal ulcers, she was admitted several weeks ago to [facility] with a reoccurrence of blood in stool.  Work-up revealed distal rectal ulcers.    Patient has  remained extremely weak and frail, extremely poor oral intake, has dense left-sided hemiparesis and very poor baseline with poor quality of life, initially family was interested in pursuing hospice which I think was appropriate, there were conversations about stopping dialysis treatments however family now wants to continue present line of care, patient is DNR undergoing dialysis treatments Monday Wednesday Friday.  Due to her overlying comorbidities, poor oral intake and extremely poor physical conditioning her long-term prognosis appears to be extremely poor and guarded however family wants to pursue present line of care, I think continuing present line of medical care with dialysis 3 times a week, looking for appropriate placement to SNF is appropriate.  If she declines further I do not think escalation in care will be in the best interest of patient at all.    Data Reviewed:  CBC: Recent Labs  Lab 08/18/21 0730 08/21/21 0851 08/22/21 0618  WBC 23.5* 16.6* 14.9*  NEUTROABS  --  13.2*  --   HGB 9.9* 8.5* 8.5*  HCT 32.8* 27.8* 28.2*  MCV 91.9 89.7 91.0  PLT 342 253 270     CBG: Recent Labs  Lab 08/23/21 0646 08/23/21 0724 08/23/21 1112 08/24/21 0621 08/24/21 0647  GLUCAP 138* 122* 112* 62* 80     Scheduled Meds:  apixaban  5 mg Oral BID   Chlorhexidine Gluconate Cloth  6 each Topical Q0600   darbepoetin (ARANESP) injection - DIALYSIS  200 mcg Intravenous Q Fri-HD   feeding supplement  237 mL Oral TID BM   gabapentin  100 mg Oral Q12H   methocarbamol  500 mg Oral TID   mirtazapine  7.5 mg Oral QHS   OLANZapine zydis  2.5 mg Oral QHS   phosphorus  250 mg Oral BID   predniSONE  10 mg Oral BID WC   sodium chloride flush  10-40 mL Intracatheter Q12H   venlafaxine  37.5 mg Oral BID     Active Problems:   Essential hypertension   Type 2 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, with long-term current use of insulin (HCC)   Anemia in other chronic  diseases classified elsewhere   ESRD on dialysis (HCC)   Dyslipidemia   Stage 2 skin ulcer of sacral region ([place])   Cerebral thrombosis with cerebral infarction   Protein-calorie malnutrition, severe   Acute GI bleeding   Failure to thrive in adult   Rectal bleeding   Delirium due to another medical condition   Neurogenic dysphagia   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)   End of life care   Consultants: Vascular surgery Nephrology Gastroenterology Neurology Palliative medicine General surgery  Procedures: EEG Echocardiogram Cortrack EGD  Antibiotics: Ceftriaxone 8/3 through 8/5  Time spent: 25 minutes 96  days  Signature  Lala Lund M.D on 08/24/2021 at 11:29 AM   -  To page  go to [email or web address]

## 2021-08-24 NOTE — Progress Notes (Signed)
Subjective: Seen in room ,no voiced complaint is not as interactive today  Objective Vital signs in last 24 hours: Vitals:   08/23/21 1629 08/23/21 2040 08/24/21 0536 08/24/21 0908  BP: (!) 136/46 (!) 162/52 (!) 173/54 (!) 122/51  Pulse: 82 88 100 96  Resp: 14 16 20 14   Temp: 98.8 F (37.1 C) 98.8 F (37.1 C) 99 F (37.2 C) 98.8 F (37.1 C)  TempSrc:  Axillary    SpO2: 100% 100% 100% 100%  Weight:      Height:       Weight change:   Physical Exam: General: Slight lethargic chronically ill-appearing elderly female NAD/opens eyes voice only Heart: RRR no MRG Lungs: CTA nonlabored breathing Abdomen: NABS soft NT ND Extremities: Trace bilateral lower extremity edema, Dialysis Access: TDC nontender no discharge   Dialysis Orders: AF MWF  3h 26min 400/500 62.5kg 3K/2.5 Ca P2 AVG HeRO   Hep none - Mircera 50 q 2 wks  (7/13)  Venofer 50 q wk - Parsabiv 5mg  IV q HD - not on [facility] formulary   Assessment/Plan: AMS/FTT: Mental status waxes/wanes.  Family had decided on comfort measures and has now decided they would like her to continue HD. PT evaluating.  Anemia/Rectal bleeding - GI consulted. Hx rectal ulcers.  EGD 7/27 normal, flex sig with mucosal ulceration c/w sterocoral ulcers, non bleeding. Dropped to 6.5 on 10/10. S/p prbcs on 10/10. Hgb now in the 8-9 range. Continue aranesp q Friday.  Leukocytosis -- ?Pneumonia/?Bacteremia: Was on IV abx - per primary. Blood cultures +S. Epidermidis.  WBC 14.9 on 10/28 monitor ESRD: Her last HD= 08/04/21 prior to initiating comfort measures, resumed 10/20 per family's wishes. Tolerating HD for the most part but has had some tachycardia //to attempt dialysis in  recliner tomorrow Access Issues/AVG clotted: Had f'gram  6/23 /22 which showed chronic thrombus and was started on Eliquis. AVG clotted 06/27/21, unable to be declotted d/t arm contracture. S/P conversion to tunneled HD catheter 07/02/21.  Hypertension/volume: BP variable, was on midodrine  prior to comfort measures being initiated. Getting some edema in her upper extremities but has not been tolerating much UF with HD due to tachycardia.  Not short of breath, 746 UF yesterday HD MBD: Not on a binder.  10/28= phos 1.4- will supplement  Correctly calcium slightly high, no VDRA.  Use low calcium bath on dialysis, Parsabiv not on formulary here.  Nutrition: Alb very low 1.7. Not a candidate for feeding tube. Poor PO intake. Recent CVA with no evidence of meaningful recovery Psych eval from 9/14 determined that patient has functional mental capacity for medical decision-making. Psych re-consulted 9/23 to evaluate the patient again - at this point, patient does not have the capacity to make medical decisions. Ongoing discussions with patient's son continues Dispo: DNR/DNI. Poor prognosis. Family was interested in hospice and patient was on comfort measures, family has now changed their mind and has not been very responsive to palliative care. Patient will need to be strong enough to tolerate dialysis in a recliner before she can be discharged to outpatient.  Ernest Haber, PA-C [facility] Beeper [phone] 08/24/2021,12:04 PM  LOS: 96 days   Labs: Basic Metabolic Panel: Recent Labs  Lab 08/18/21 0730 08/21/21 0851 08/22/21 0618  NA 136 137 137  K 3.2* 3.4* 3.4*  CL 97* 102 100  CO2 29 28 29   GLUCOSE 118* 96 118*  BUN 19 10 15   CREATININE 4.37* 2.55* 3.28*  CALCIUM 8.4* 8.1* 8.2*  PHOS  2.1*  --  1.4*   Liver Function Tests: Recent Labs  Lab 08/18/21 0730 08/21/21 0851 08/22/21 0618  AST  --  13*  --   ALT  --  10  --   ALKPHOS  --  95  --   BILITOT  --  0.9  --   PROT  --  5.6*  --   ALBUMIN 1.6* 1.6* 1.7*   No results for input(s): LIPASE, AMYLASE in the last 168 hours. No results for input(s): AMMONIA in the last 168 hours. CBC: Recent Labs  Lab 08/18/21 0730 08/21/21 0851 08/22/21 0618  WBC 23.5* 16.6* 14.9*  NEUTROABS  --  13.2*  --   HGB  9.9* 8.5* 8.5*  HCT 32.8* 27.8* 28.2*  MCV 91.9 89.7 91.0  PLT 342 253 270   Cardiac Enzymes: No results for input(s): CKTOTAL, CKMB, CKMBINDEX, TROPONINI in the last 168 hours. CBG: Recent Labs  Lab 08/23/21 0724 08/23/21 1112 08/24/21 0621 08/24/21 0647 08/24/21 1142  GLUCAP 122* 112* 62* 80 98    Studies/Results: VAS [place] UPPER EXTREMITY VENOUS DUPLEX  Result Date: 08/22/2021 UPPER VENOUS STUDY  Patient Name:  Amy [name]  Date of Exam:   08/22/2021 Medical Rec #: [id number]         Accession #:    [id number] Date of Birth: 1946/05/27         Patient Gender: F Patient Age:   75 years Exam Location:  [facility] Procedure:      VAS [place] UPPER EXTREMITY VENOUS DUPLEX Referring Phys: Erin Hearing --------------------------------------------------------------------------------  Indications: Edema Limitations: Poor ultrasound/tissue interface and edema. Comparison Study: No previous RUE exams Performing Technologist: Jody Hill RVT, RDMS  Examination Guidelines: A complete evaluation includes B-mode imaging, spectral Doppler, color Doppler, and power Doppler as needed of all accessible portions of each vessel. Bilateral testing is considered an integral part of a complete examination. Limited examinations for reoccurring indications may be performed as noted.  Right Findings: +----------+------------+---------+-----------+----------+-------+ RIGHT     CompressiblePhasicitySpontaneousPropertiesSummary +----------+------------+---------+-----------+----------+-------+ IJV           Full       Yes       Yes                      +----------+------------+---------+-----------+----------+-------+ Subclavian    Full       Yes       Yes                      +----------+------------+---------+-----------+----------+-------+ Axillary      Full       Yes       Yes                      +----------+------------+---------+-----------+----------+-------+ Brachial      Full        Yes       Yes                      +----------+------------+---------+-----------+----------+-------+ Radial        Full                                          +----------+------------+---------+-----------+----------+-------+ Ulnar         Full                                          +----------+------------+---------+-----------+----------+-------+  Cephalic      None       No        No                Acute  +----------+------------+---------+-----------+----------+-------+ Basilic       Full       Yes       Yes                      +----------+------------+---------+-----------+----------+-------+  Left Findings: +----------+------------+---------+-----------+----------+-------+ LEFT      CompressiblePhasicitySpontaneousPropertiesSummary +----------+------------+---------+-----------+----------+-------+ Subclavian               Yes       Yes                      +----------+------------+---------+-----------+----------+-------+ Subclavian patent by color and doppler - unable to compress due line placement.  Summary:  Right: No evidence of deep vein thrombosis in the upper extremity. Findings consistent with acute superficial vein thrombosis involving the right cephalic vein.  *See table(s) above for measurements and observations.  Diagnosing physician: Jamelle Haring Electronically signed by Jamelle Haring on 08/22/2021 at 4:51:59 PM.    Final    Medications:   apixaban  5 mg Oral BID   Chlorhexidine Gluconate Cloth  6 each Topical Q0600   darbepoetin (ARANESP) injection - DIALYSIS  200 mcg Intravenous Q Fri-HD   feeding supplement  237 mL Oral TID BM   gabapentin  100 mg Oral Q12H   methocarbamol  500 mg Oral TID   mirtazapine  7.5 mg Oral QHS   OLANZapine zydis  2.5 mg Oral QHS   phosphorus  250 mg Oral BID   predniSONE  10 mg Oral BID WC   sodium chloride flush  10-40 mL Intracatheter Q12H   venlafaxine  37.5 mg Oral BID    ] \

## 2021-08-25 DIAGNOSIS — L98429 Non-pressure chronic ulcer of back with unspecified severity: Secondary | ICD-10-CM | POA: Diagnosis not present

## 2021-08-25 DIAGNOSIS — I639 Cerebral infarction, unspecified: Secondary | ICD-10-CM | POA: Diagnosis not present

## 2021-08-25 DIAGNOSIS — E1122 Type 2 diabetes mellitus with diabetic chronic kidney disease: Secondary | ICD-10-CM | POA: Diagnosis not present

## 2021-08-25 DIAGNOSIS — E43 Unspecified severe protein-calorie malnutrition: Secondary | ICD-10-CM | POA: Diagnosis not present

## 2021-08-25 LAB — GLUCOSE, CAPILLARY
Glucose-Capillary: 141 mg/dL — ABNORMAL HIGH (ref 70–99)
Glucose-Capillary: 215 mg/dL — ABNORMAL HIGH (ref 70–99)
Glucose-Capillary: 214 mg/dL — ABNORMAL HIGH (ref 70–99)

## 2021-08-25 LAB — RENAL FUNCTION PANEL
Albumin: 1.7 g/dL — ABNORMAL LOW (ref 3.5–5.0)
Anion gap: 10 (ref 5–15)
BUN: 21 mg/dL (ref 8–23)
CO2: 29 mmol/L (ref 22–32)
Calcium: 8 mg/dL — ABNORMAL LOW (ref 8.9–10.3)
Chloride: 100 mmol/L (ref 98–111)
Creatinine, Ser: 3.91 mg/dL — ABNORMAL HIGH (ref 0.44–1.00)
GFR, Estimated: 12 mL/min — ABNORMAL LOW (ref >60)
Glucose, Bld: 285 mg/dL — ABNORMAL HIGH (ref 70–99)
Phosphorus: 1.7 mg/dL — ABNORMAL LOW (ref 2.5–4.6)
Potassium: 4.8 mmol/L (ref 3.5–5.1)
Sodium: 139 mmol/L (ref 135–145)

## 2021-08-25 LAB — CBC
HCT: 31 % — ABNORMAL LOW (ref 36.0–46.0)
Hemoglobin: 9 g/dL — ABNORMAL LOW (ref 12.0–15.0)
MCH: 26.4 pg (ref 26.0–34.0)
MCHC: 29 g/dL — ABNORMAL LOW (ref 30.0–36.0)
MCV: 90.9 fL (ref 80.0–100.0)
Platelets: 346 10*3/uL (ref 150–400)
RBC: 3.41 MIL/uL — ABNORMAL LOW (ref 3.87–5.11)
RDW: 19.4 % — ABNORMAL HIGH (ref 11.5–15.5)
WBC: 19.5 10*3/uL — ABNORMAL HIGH (ref 4.0–10.5)
nRBC: 0 % (ref 0.0–0.2)

## 2021-08-25 MED ORDER — HYDROCODONE-ACETAMINOPHEN 5-325 MG PO TABS
1.0000 | ORAL_TABLET | Freq: Once | ORAL | Status: AC
  Administered 2021-08-25: 1 via ORAL

## 2021-08-25 NOTE — Progress Notes (Signed)
Speech Language Pathology Treatment: Dysphagia  Patient Details Name: Amy [name] MRN: [id number] DOB: Dec 05, 1945 Today's Date: 08/25/2021 Time: 7353-2992 SLP Time Calculation (min) (ACUTE ONLY): 10 min  Assessment / Plan / Recommendation Clinical Impression  Pt's swallowing remains stable with no deterioration in function based on clinical presentation. Primary barriers to PO intake are not mechanical deficits but related to her FTT and low interest in eating. During today's session she sipped water and Ensure with adequate interest and no s/s of aspiration or difficulty.  She declined solids. RN reports consumption of 80% of her Ensure this am.  She occasionally coughs when eating, but thus far her cough has been protective in nature and should not create alarm for staff. As observed in prior notes, there may be more harm created by thickening liquids and modifying diet when there is no real rationale to do so. Recommend continuing to allow regular solids; encourage foods from home if this helps motivation to eat.  Allow thin liquids.  There are no further acute care SLP needs. D/W Erin Hearing.  Our service will sign off.   HPI HPI: Pt is a 75 y.o. female who presented to ER for dark stool with + fecal occult. EGD 7/27: normal without evidence of GI bleed. Palliative care consulted and code status has been changed to DNR. Per EMR, pt's family has noted "physical, fucntional and cognitive decliene over the past several months but dramatic decline in the past 35 days". IR was consulted for PEG placement; unfortunately unable to place on 8/4 due to overlying transverse colon.  There have been ongoing concerns regarding pts oral intake, refusing food and continuing to require Cortrak feeding. Pt has demonstrated cognitive impiarment and has refused PEG tube though it seems needed given plan of care.Pt has been evalauted by SLP on multiple occasionaly during this admission. Since last eval pt has had  recurrent EQA:STMHDQ MRI 10/10 revealed right posterior frontal, posterior temporal, parietal and occipital acute versus subacute infarct. SLP reconsulted due to concern for aspiration.  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 Palliative care following.      SLP Plan  All goals met      Recommendations for follow up therapy are one component of a multi-disciplinary discharge planning process, led by the attending physician.  Recommendations may be updated based on patient status, additional functional criteria and insurance authorization.    Recommendations  Diet recommendations: Regular;Thin liquid Liquids provided via: Cup;Straw Medication Administration: Crushed with puree Supervision: Full supervision/cueing for compensatory strategies Compensations: Slow rate;Small sips/bites;Follow solids with liquid;Minimize environmental distractions                Oral Care Recommendations: Oral care BID Follow up Recommendations: Skilled Nursing facility SLP Visit Diagnosis: Dysphagia, unspecified (R13.10) Plan: All goals met       GO               Amy [name] L. Tivis Ringer, [place] Office number [phone] Pager [phone]  Amy [name]  08/25/2021, 9:53 AM

## 2021-08-25 NOTE — Progress Notes (Signed)
[facility] Progress Note   Subjective: Seen in room, talking but not answering questions. Rambling, discombobulated words. NAD.  HD today.   Objective Vitals:   08/24/21 1730 08/24/21 2254 08/25/21 0644 08/25/21 0924  BP:  (!) 174/65 (!) 170/79 (!) 169/77  Pulse:  (!) 102 94 (!) 107  Resp: 14 16 16 16   Temp:  98.6 F (37 C) 97.7 F (36.5 C) 99.1 F (37.3 C)  TempSrc:   Oral Oral  SpO2:  100% 100% 100%  Weight:      Height:       Physical Exam General: Chronically ill appearing female in NAD Heart: S1,S2 RRR No M/R/G Lungs: CTAB No WOB Abdomen: NABS, NT Extremities:No LE edema Dialysis Access: [facility] Drsg intact   Additional Objective Labs: Basic Metabolic Panel: Recent Labs  Lab 08/21/21 0851 08/22/21 0618  NA 137 137  K 3.4* 3.4*  CL 102 100  CO2 28 29  GLUCOSE 96 118*  BUN 10 15  CREATININE 2.55* 3.28*  CALCIUM 8.1* 8.2*  PHOS  --  1.4*   Liver Function Tests: Recent Labs  Lab 08/21/21 0851 08/22/21 0618  AST 13*  --   ALT 10  --   ALKPHOS 95  --   BILITOT 0.9  --   PROT 5.6*  --   ALBUMIN 1.6* 1.7*   No results for input(s): LIPASE, AMYLASE in the last 168 hours. CBC: Recent Labs  Lab 08/21/21 0851 08/22/21 0618  WBC 16.6* 14.9*  NEUTROABS 13.2*  --   HGB 8.5* 8.5*  HCT 27.8* 28.2*  MCV 89.7 91.0  PLT 253 270   Blood Culture    Component Value Date/Time   SDES BLOOD RIGHT HAND 08/21/2021 0851   SDES BLOOD LEFT HAND 08/21/2021 0851   SPECREQUEST  08/21/2021 0851    BOTTLES DRAWN AEROBIC AND ANAEROBIC Blood Culture adequate volume   SPECREQUEST  08/21/2021 0851    BOTTLES DRAWN AEROBIC AND ANAEROBIC Blood Culture adequate volume   CULT  08/21/2021 0851    NO GROWTH 4 DAYS Performed at [facility], [place] [address] [place], [place] [address]    CULT  08/21/2021 [phone]    NO GROWTH 4 DAYS Performed at [facility], [place] [address], [place], [place] [address]    REPTSTATUS PENDING 08/21/2021 0851    REPTSTATUS PENDING 08/21/2021 0851    Cardiac Enzymes: No results for input(s): CKTOTAL, CKMB, CKMBINDEX, TROPONINI in the last 168 hours. CBG: Recent Labs  Lab 08/24/21 0647 08/24/21 1142 08/24/21 1650 08/24/21 2247 08/25/21 0647  GLUCAP 80 98 81 79 141*   Iron Studies: No results for input(s): IRON, TIBC, TRANSFERRIN, FERRITIN in the last 72 hours. @lablastinr3 @ Studies/Results: No results found. Medications:   apixaban  5 mg Oral BID   Chlorhexidine Gluconate Cloth  6 each Topical Q0600   darbepoetin (ARANESP) injection - DIALYSIS  200 mcg Intravenous Q Fri-HD   feeding supplement  237 mL Oral TID BM   gabapentin  100 mg Oral Q12H   methocarbamol  500 mg Oral TID   mirtazapine  7.5 mg Oral QHS   OLANZapine zydis  2.5 mg Oral QHS   phosphorus  250 mg Oral BID   predniSONE  10 mg Oral BID WC   sodium chloride flush  10-40 mL Intracatheter Q12H   venlafaxine  37.5 mg Oral BID     Dialysis Orders: AF MWF  3h 22min 400/500 62.5kg 3K/2.5 Ca P2 AVG HeRO    --  No Heparin - Mircera 50 mcg IV q 2 wks  (7/13)   --Venofer 50mg  IV  q wk - Parsabiv 5mg  IV q HD - not on [facility] formulary   Assessment/Plan: AMS/FTT: Mental status waxes/wanes.  Family had decided on comfort measures and has now decided they would like her to continue HD. PT evaluating.  Anemia/Rectal bleeding - GI consulted. Hx rectal ulcers.  EGD 7/27 normal, flex sig with mucosal ulceration c/w sterocoral ulcers, non bleeding. Dropped to 6.5 on 10/10. S/p prbcs on 10/10. Hgb now in the 8-9 range. Continue aranesp q Friday.  Leukocytosis -- RLL HCAP, BC with stap epi. Rec'd Maxipime for one week. Per primary.  ESRD: HD was stopped 08/04/2021. Plan was for comfort measures only. However family rescinded plan. Patient resumed HD 08/14/2021 per family wishes. Now on MWF schedule. HD today on schedule.  SCr 2.5-3.2. Consider Biweekly HD.  Access Issues/AVG clotted: Had f'gram  6/23 /22 which showed chronic thrombus and was  started on Eliquis. AVG clotted 06/27/21, unable to be declotted d/t arm contracture. [place] placed 07/02/21.  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  PO4 1.4 08/22/2021. Currently on oral K+Phos.  Nutrition: Alb very low 1.7. Not a candidate for feeding tube. Poor PO intake. Recent CVA with no evidence of meaningful recovery Psych eval from 9/14 determined that patient has functional mental capacity for medical decision-making. Psych re-consulted 9/23 to evaluate the patient again - at this point, patient does not have the capacity to make medical decisions. Ongoing discussions with patient's son continues Dispo: DNR/DNI. Poor prognosis. Family was interested in hospice and patient was on comfort measures, family has now changed their mind and has not been very responsive to palliative care. Patient will need to be strong enough to tolerate dialysis in a recliner before she can be discharged to outpatient. We have been clear that we do not believe hemodialysis is contributing to quality of this woman's life.     Vinetta Brach H. Dyann Goodspeed NP-C 08/25/2021, 11:10 AM  [employer] [phone]

## 2021-08-25 NOTE — Progress Notes (Signed)
[name] PROGRESS NOTE  Amy [name] LZJ:[id number] DOB: 1946-06-22 DOA: 05/19/2021 PCP: [name], MD                                        10/28 trying to smile during hemodialysis treatment difficult due to positioning in bed   Status:  Remains inpatient appropriate because: Unsafe d/c plan, IV treatments appropriate due to intensity of illness or inability to take PO, and Inpatient level of care appropriate due to severity of illness patient continues to require dialysis as requested by family and until she is able to transition to the standards of outpatient dialysis she must remain in the hospital.  Barriers to discharge:  Patient needs to be able to tolerate transport to an outpatient dialysis center.  She would either need to be able to tolerate sitting up in a wheelchair for transport to the center or via stretcher.  She needs to demonstrate tolerance to sitting in recliner for 3-1/2 to 4 hours. Given her refusal to eat institutional food and her preference for home-cooked meals best discharge option would be home with family for 24/7 care if they are able to obtain private pay home health services   Level of care:  Med-Surg  Code Status: DNR Family communication: 10/28 updated both son and goddaughter [name] by telephone today. DVT prophylaxis: Eliquis COVID vaccination status: [name] 02/26/2020, Pfizer 04/10/2021    HPI: 75 y.o. 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 Patient presented to the ED on 7/25 with dark stool.  On presentation, hemoglobin was 9.6; GI was consulted.  She underwent EGD which was normal and flexible sigmoidoscopy which  showed nonbleeding distal rectal ulcers/stercoral ulcers.  Subsequently, GI signed off.  Neurology was also consulted for altered mental status; MRI brain was similar to prior MRI at [facility] with a large right MCA stroke.  EEG this admission unremarkable. PEG tube was desired by family but could not be placed because of overlying transverse colon.  General surgery was consulted for G-tube placement. Patient deemed high risk for open placement of gastrostomy tube.  Subsequently she has suffered another stroke which precludes her from any surgical procedures including open G-tube placement.  This was discussed with her son who was in agreement.  Unfortunately he was more conflicted about stopping dialysis noting prior to the stroke and subsequently since that time each time he is asked his mother regarding stopping dialysis she has told him that she does not want to die and wants to continue dialysis.  Consideration given to transitioning to residential hospice once family in agreement about how to proceed with end-of-life issues.  Patient did have an episode of decline immediately following dialysis on 10/20 but subsequently recovered.  She continues not to eat and intermittently takes medications by mouth.  At this point since patient remains relatively stable despite not eating plan is to determine if she is able to tolerate dialysis in a chair which is a requirement for outpatient hemodialysis.  Once this is accomplished we will need to pursue discharge disposition either to home with family caring for patient or to SNF with family paying out-of-pocket  for care.   Subjective: Alert and pleasant today.  Discussed types of food she would like to eat.  Confirmed that she would like homemade greens, fried okra and squash.  Objective: Vitals:   08/24/21 2254 08/25/21 0644  BP: (!) 174/65 (!) 170/79  Pulse: (!) 102 94  Resp: 16 16  Temp: 98.6 F (37 C) 97.7 F (36.5 C)  SpO2: 100% 100%    Intake/Output  Summary (Last 24 hours) at 08/25/2021 0904 Last data filed at 08/25/2021 0830 Gross per 24 hour  Intake 427 ml  Output 0 ml  Net 427 ml    Filed Weights   08/18/21 0700 08/18/21 1100 08/22/21 2024  Weight: 75 kg 75.1 kg 93.4 kg    Exam:  Constitutional: Awake and alert and interactive Respiratory: On 1 L O2 no increased work of breathing and normal pulse oximetry. Cardiovascular: S1-S2, pulse regular and tachycardic, continues with bilateral UEs edema which is chronic in nature. Abdomen: LBM 10/30, abdomen soft nontender nondistended.  Bowel sounds +, over the past 24 hours 0 oral intake including fluids and meal Neurologic: CN 2-12 grossly intact on visual inspection-sensation remains intact, very limited spontaneous movement.  Unable to test strength but appears to be 1/5 x 4 extremities Psychiatric: Awake.  Oriented times person and place  Assessment/Plan: Acute problems: Sepsis physiology/RLL HCAP/Abn blood cxs c/w contaminant Completed 7 days of Maxipime  Failure to thrive with generalized deconditioning in context of neurogenic dysphagia Given acute stroke patient is no longer a candidate for open G-tube placement.  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-recurrent acute on subacute stroke Recent MRI 10/10 revealed right posterior frontal, posterior temporal, parietal and occipital acute versus subacute infarct Continue Eliquis   LUE and RUE edema 2/2 superficial thrombophlebitis LUE edema chronic in nature Venous duplex neg for DVT but does demonstrate superficial thrombophlebitis Continue full dose anticoagulation   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  Given recent and recurrent stroke she is not a candidate for operative placement/open G-tube  Suspected dementia/recent delirium Continue Remeron and Effexor  ESRD on HD Continues to tolerate dialysis with intermittent episodes of tachycardia likely related to dehydration from poor oral intake Continue therapy and nursing interventions to promote ability to sit up in chair so can tolerate outpatient hemodialysis  Neck pain Continue scheduled low-dose Robaxin  Type 2 diabetes mellitus Episodes of hypoglycemia Hemoglobin A1c 5.1.  Follow CBGs and provide SSI as indicated Not eating enough for long-acting insulin Continue Neurontin for peripheral neuropathy  Stage II mid coccyx pressure injury, present on admission CT pelvis 10/10 without evidence of abscess Continue local wound care-given poor nutrition and is doubtful this wound will ever heal and likely will only get worse due to her concomitant lack of mobility        Data Reviewed:  CBC: Recent Labs  Lab 08/21/21 0851 08/22/21 0618  WBC 16.6* 14.9*  NEUTROABS 13.2*  --   HGB 8.5* 8.5*  HCT 27.8* 28.2*  MCV 89.7 91.0  PLT 253 270     CBG: Recent Labs  Lab 08/24/21  5102 08/24/21 1142 08/24/21 1650 08/24/21 2247 08/25/21 0647  GLUCAP 80 98 81 79 141*     Scheduled Meds:  apixaban  5 mg Oral BID   Chlorhexidine Gluconate Cloth  6 each Topical Q0600   darbepoetin (ARANESP) injection - DIALYSIS  200 mcg Intravenous Q Fri-HD   feeding supplement  237 mL Oral TID BM   gabapentin  100 mg Oral Q12H   methocarbamol  500 mg Oral TID   mirtazapine  7.5 mg Oral QHS   OLANZapine zydis  2.5 mg Oral QHS   phosphorus  250 mg Oral BID   predniSONE  10 mg Oral BID WC   sodium chloride flush  10-40 mL Intracatheter Q12H   venlafaxine   37.5 mg Oral BID     Active Problems:   Essential hypertension   Type 2 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, with long-term current use of insulin (HCC)   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   ESRD on dialysis ([place])   Dyslipidemia   Stage 2 skin ulcer of sacral region ([place])   Cerebral thrombosis with cerebral infarction   Protein-calorie malnutrition, severe   Acute GI bleeding   Failure to thrive in adult   Rectal bleeding   Delirium due to another medical condition   Neurogenic dysphagia   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 ([place])   HCAP (healthcare-associated pneumonia)   Sepsis due to other etiology [facility])   Recurrent cerebrovascular accidents (CVAs) ([place])   End of life care   Consultants: Vascular surgery Nephrology Gastroenterology Neurology Palliative medicine General surgery  Procedures: EEG Echocardiogram [place] EGD  Antibiotics: Ceftriaxone 8/3 through 8/5   Time spent: 15 minutes    Erin Hearing ANP  [facility] 7 am - 330 pm/M-F for direct patient care and secure chat Please refer to Amion for contact info 97  days

## 2021-08-25 NOTE — Progress Notes (Signed)
Patient refused her breakfast. She drank 80% of her ensure.

## 2021-08-25 NOTE — Plan of Care (Signed)
  Problem: Health Behavior/Discharge Planning: Goal: Ability to manage health-related needs will improve Outcome: Progressing   Problem: Clinical Measurements: Goal: Ability to maintain clinical measurements within normal limits will improve Outcome: Progressing   Problem: Activity: Goal: Risk for activity intolerance will decrease Outcome: Progressing   Problem: Nutrition: Goal: Adequate nutrition will be maintained Outcome: Progressing   Problem: Skin Integrity: Goal: Risk for impaired skin integrity will decrease Outcome: Progressing

## 2021-08-26 DIAGNOSIS — K922 Gastrointestinal hemorrhage, unspecified: Secondary | ICD-10-CM | POA: Diagnosis not present

## 2021-08-26 LAB — CULTURE, BLOOD (ROUTINE X 2)
Culture: NO GROWTH
Culture: NO GROWTH
Special Requests: ADEQUATE
Special Requests: ADEQUATE

## 2021-08-26 LAB — GLUCOSE, CAPILLARY
Glucose-Capillary: 246 mg/dL — ABNORMAL HIGH (ref 70–99)
Glucose-Capillary: 206 mg/dL — ABNORMAL HIGH (ref 70–99)
Glucose-Capillary: 239 mg/dL — ABNORMAL HIGH (ref 70–99)
Glucose-Capillary: 242 mg/dL — ABNORMAL HIGH (ref 70–99)

## 2021-08-26 MED ORDER — PREDNISONE 5 MG PO TABS: 5.0000 mg | ORAL_TABLET | Freq: Two times a day (BID) | ORAL | Status: DC

## 2021-08-26 NOTE — Progress Notes (Signed)
[facility] Progress Note   Subjective: Seen in room, minimally responsive today.      Objective Vitals:   08/25/21 1711 08/25/21 2045 08/26/21 0450 08/26/21 0916  BP: 132/76 135/78 (!) 108/39 (!) 104/45  Pulse: (!) 109 (!) 108 96 85  Resp: 20 18 18 18   Temp: 98.2 F (36.8 C) 98.6 F (37 C) 99.1 F (37.3 C) 98 F (36.7 C)  TempSrc: Oral  Oral   SpO2: 100% 100% 100% 100%  Weight:      Height:       Physical Exam General: Chronically ill appearing female in NAD Heart: S1,S2 RRR No M/R/G Lungs: CTAB No WOB Abdomen: NABS, NT Extremities:No LE edema Dialysis Access: [facility] Drsg intact   Additional Objective Labs: Basic Metabolic Panel: Recent Labs  Lab 08/21/21 0851 08/22/21 0618 08/25/21 1403  NA 137 137 139  K 3.4* 3.4* 4.8  CL 102 100 100  CO2 28 29 29   GLUCOSE 96 118* 285*  BUN 10 15 21   CREATININE 2.55* 3.28* 3.91*  CALCIUM 8.1* 8.2* 8.0*  PHOS  --  1.4* 1.7*   Liver Function Tests: Recent Labs  Lab 08/21/21 0851 08/22/21 0618 08/25/21 1403  AST 13*  --   --   ALT 10  --   --   ALKPHOS 95  --   --   BILITOT 0.9  --   --   PROT 5.6*  --   --   ALBUMIN 1.6* 1.7* 1.7*   No results for input(s): LIPASE, AMYLASE in the last 168 hours. CBC: Recent Labs  Lab 08/21/21 0851 08/22/21 0618 08/25/21 1403  WBC 16.6* 14.9* 19.5*  NEUTROABS 13.2*  --   --   HGB 8.5* 8.5* 9.0*  HCT 27.8* 28.2* 31.0*  MCV 89.7 91.0 90.9  PLT 253 270 346   Blood Culture    Component Value Date/Time   SDES BLOOD RIGHT HAND 08/21/2021 0851   SDES BLOOD LEFT HAND 08/21/2021 0851   SPECREQUEST  08/21/2021 0851    BOTTLES DRAWN AEROBIC AND ANAEROBIC Blood Culture adequate volume   SPECREQUEST  08/21/2021 0851    BOTTLES DRAWN AEROBIC AND ANAEROBIC Blood Culture adequate volume   CULT  08/21/2021 0851    NO GROWTH 5 DAYS Performed at [place] [address], [place], [place] [address]    CULT  08/21/2021 [phone]    NO GROWTH 5 DAYS Performed  at [facility], [place] [address] [place], [place] [address]    REPTSTATUS 08/26/2021 FINAL 08/21/2021 0851   REPTSTATUS 08/26/2021 FINAL 08/21/2021 0851    Cardiac Enzymes: No results for input(s): CKTOTAL, CKMB, CKMBINDEX, TROPONINI in the last 168 hours. CBG: Recent Labs  Lab 08/24/21 2247 08/25/21 0647 08/25/21 1134 08/25/21 2045 08/26/21 0649  GLUCAP 79 141* 215* 214* 246*   Iron Studies: No results for input(s): IRON, TIBC, TRANSFERRIN, FERRITIN in the last 72 hours. @lablastinr3 @ Studies/Results: No results found. Medications:   apixaban  5 mg Oral BID   Chlorhexidine Gluconate Cloth  6 each Topical Q0600   darbepoetin (ARANESP) injection - DIALYSIS  200 mcg Intravenous Q Fri-HD   feeding supplement  237 mL Oral TID BM   gabapentin  100 mg Oral Q12H   methocarbamol  500 mg Oral TID   mirtazapine  7.5 mg Oral QHS   OLANZapine zydis  2.5 mg Oral QHS   predniSONE  10 mg Oral BID WC   sodium chloride flush  10-40 mL  Intracatheter Q12H   venlafaxine  37.5 mg Oral BID     Dialysis Orders: AF MWF  3h 24min 400/500 62.5kg 3K/2.5 Ca P2 AVG HeRO    --No Heparin - Mircera 50 mcg IV q 2 wks  (7/13)   --Venofer 50mg  IV  q wk - Parsabiv 5mg  IV q HD - not on [facility] formulary   Assessment/Plan: AMS/FTT: Mental status waxes/wanes.  Family had decided on comfort measures and has now decided they would like her to continue HD. PT evaluating.  Anemia/Rectal bleeding - GI consulted. Hx rectal ulcers.  EGD 7/27 normal, flex sig with mucosal ulceration c/w sterocoral ulcers, non bleeding. Dropped to 6.5 on 10/10. S/p prbcs on 10/10. Hgb now in the 8-9 range. Continue aranesp q Friday.  Leukocytosis -- RLL HCAP, BC with stap epi. Rec'd Maxipime for one week. Per primary.  ESRD: HD was stopped 08/04/2021. Plan was for comfort measures only. However family rescinded plan. Patient resumed HD 08/14/2021 per family wishes. Now on MWF schedule. SCr 2.5-3.2. Start  Biweekly HD on Monday  and Thursday, next HD 08/28/2021.  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 AVG clotted 06/27/21, unable to be declotted d/t arm contracture. [place] placed 07/02/21.  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  C Ca 9.84 PO4 1.7 08/25/2021. Currently on oral K+Phos.  Nutrition: Alb very low 1.7. Not a candidate for feeding tube. Poor PO intake. Recent CVA with no evidence of meaningful recovery Psych eval from 9/14 determined that patient has functional mental capacity for medical decision-making. Psych re-consulted 9/23 to evaluate the patient again - at this point, patient does not have the capacity to make medical decisions. Ongoing discussions with patient's son continues Dispo: DNR/DNI. Poor prognosis. Family was interested in hospice and patient was on comfort measures, family has now changed their mind and has not been very responsive to palliative care. Patient will need to be strong enough to tolerate dialysis in a recliner before she can be discharged to outpatient. We have been clear that we do not believe hemodialysis is contributing to quality of this woman's life.       Areliz Rothman H. Yianna Tersigni NP-C 08/26/2021, 11:26 AM  [employer] [phone]

## 2021-08-26 NOTE — Progress Notes (Signed)
[name] PROGRESS NOTE  SHINE SCROGHAM CZY:[id number] DOB: 12-Nov-1945 DOA: 05/19/2021 PCP: [name], MD  Status:  Remains inpatient appropriate because: Unsafe d/c plan, IV treatments appropriate due to intensity of illness or inability to take PO, and Inpatient level of care appropriate due to severity of illness patient continues to require dialysis as requested by family and until she is able to transition to the standards of outpatient dialysis she must remain in the hospital.  Barriers to discharge:  Patient needs to be able to tolerate transport to an outpatient dialysis center.  She would either need to be able to tolerate sitting up in a wheelchair for transport to the center or via stretcher.  She needs to demonstrate tolerance to sitting in recliner for 3-1/2 to 4 hours. Given her refusal to eat institutional food and her preference for home-cooked meals best discharge option would be home with family for 24/7 care if they are able to obtain private pay home health services   Level of care:  Med-Surg  Code Status: DNR Family communication: 10/28 updated both son and goddaughter [name] by telephone today. DVT prophylaxis: Eliquis COVID vaccination status: [name] 02/26/2020, Pfizer 04/10/2021    HPI: 75 y.o. female with PMH significant for ESRD on HD MWF, HTN, HLD, CVA s/p TPA 02/2021 with resultant left hemiparesis, partial blood clot upper extremity, recently hospitalized to [facility] on 03/13/2021-04/22/2021 during which patient underwent flexible sigmoidoscopy on 03/26/2021 which showed multiple rectal ulcers likely secondary to trauma from enemas and constipation. Patient presented to the ED on 7/25 with dark stool.  On presentation, hemoglobin was 9.6; GI was consulted.  She underwent EGD which was normal and flexible sigmoidoscopy which showed nonbleeding distal rectal ulcers/stercoral ulcers.  Subsequently, GI signed off.  Neurology was also consulted for altered  mental status; MRI brain was similar to prior MRI at [facility] with a large right MCA stroke.  EEG this admission unremarkable. PEG tube was desired by family but could not be placed because of overlying transverse colon.  General surgery was consulted for G-tube placement. Patient deemed high risk for open placement of gastrostomy tube.  Subsequently she has suffered another stroke which precludes her from any surgical procedures including open G-tube placement.  This was discussed with her son who was in agreement.  Unfortunately he was more conflicted about stopping dialysis noting prior to the stroke and subsequently since that time each time he is asked his mother regarding stopping dialysis she has told him that she does not want to die and wants to continue dialysis.  Consideration given to transitioning to residential hospice once family in agreement about how to proceed with end-of-life issues.  Patient did have an episode of decline immediately following dialysis on 10/20 but subsequently recovered.  She continues not to eat and intermittently takes medications by mouth.  At this point since patient remains relatively stable despite not eating plan is to determine if she is able to tolerate dialysis in a chair which is a requirement for outpatient hemodialysis.  Once this is accomplished we will need to pursue discharge disposition either to home with family caring for patient or to SNF with family paying out-of-pocket for care.   Subjective:  Awake bed appears to be in no distress  - no headache or SOB.  Objective: Vitals:   08/26/21 0450 08/26/21 0916  BP: (!) 108/39 (!) 104/45  Pulse: 96 85  Resp: 18 18  Temp: 99.1 F (37.3 C) 98 F (36.7 C)  SpO2: 100% 100%    Intake/Output Summary (Last 24 hours) at 08/26/2021 1201 Last data filed at 08/26/2021 1027 Gross per 24 hour  Intake 537 ml  Output 1000 ml  Net -463 ml    Filed Weights   08/22/21 2024  Weight: 93.4 kg    Exam:  Awake  Alert to place, dense left-sided hemiparesis, appears very weak and frail, L. IJ. HD Catheter [place].AT,PERRAL Supple Neck, No JVD,   Symmetrical Chest wall movement, Good air movement bilaterally, CTAB RRR,No Gallops, Rubs or new Murmurs,  +ve B.Sounds, Abd Soft, No tenderness,   No Cyanosis, Clubbing or edema    Assessment/Plan:  Sepsis physiology/RLL HCAP/Abn blood cxs c/w contaminant - Completed 7 days of Maxipime-White count normalized as of 10/19 but has increased again over the past 2 days with readings of 28,000 and 23,000.  Mains high risk for aspiration.  Continue to monitor off antibiotics.   Failure to thrive with generalized deconditioning in context of neurogenic dysphagia - Given acute stroke patient very poor quality of life PEG tube is likely not beneficial, she has poor anatomy for IR to place a PEG tube and a very poor candidate for open surgical PEG tube placement, her son agrees , continue oral diet as tolerated prefers home-cooked meals.  Due to intermittent hypoglycemia placed on low-dose twice daily prednisone on 07/3021. dose reduced 08/26/21  This is not a permanent strategy however will allow [place] to obtain some euglycemia.  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  Given recent and recurrent stroke she is not a candidate for operative placement/open G-tube  Suspected dementia/recent delirium - Continue Remeron and Effexor  ESRD on HD - Continues to tolerate dialysis with intermittent episodes of tachycardia likely related to dehydration from poor oral intake, nephrology following on TTS schedule.  Patient very clear in my interview on 08/22/2021 that she wants to continue dialysis and present line of care, of note she has periods of lucidity, Dr. Marval Regal was bedside on 08/22/2021 when this conversation happened.  Neck pain - Continue scheduled low-dose Robaxin IV-this is the least sedating of muscle relaxers and trying to avoid narcotics  Chronic thrombus left upper extremity - Continue Eliquis  Stage II mid coccyx pressure injury, present on admission - CT pelvis 10/10 without evidence of abscess.  Continue local wound care-given poor nutrition and is doubtful this wound will ever heal and likely will only get worse due to her concomitant lack of mobility   Type 2 diabetes mellitus - Episodes of hypoglycemia, Hemoglobin A1c 5.1., episodes of Hypoglycemia due to poor PO intake,  hypoglycemia better on Prednisone, dose changed on 08/26/21  CBG (last 3)  Recent Labs    08/25/21 2045 08/26/21 0649 08/26/21 1125  GLUCAP 214* 246* 206*    Lab Results  Component Value Date   HGBA1C 5.1  05/28/2021    Overall prognosis and long-term plan - 75 year old patient who is on dialysis Monday Wednesday Friday, essential hypertension, dyslipidemia, s/p stroke requiring tPA administration in May 2022 with residual left-sided hemiparesis, recent prolonged hospitalization at [facility] few months ago with problems with rectal ulcers, she was admitted several weeks ago to [facility] with a reoccurrence of blood in stool.  Work-up revealed distal rectal ulcers.    Patient has remained extremely weak  and frail, extremely poor oral intake, has dense left-sided hemiparesis and very poor baseline with poor quality of life, initially family was interested in pursuing hospice which I think was appropriate, there were conversations about stopping dialysis treatments however family now wants to continue present line of care, patient is DNR undergoing dialysis treatments Monday Wednesday Friday.  Due to her overlying comorbidities, poor oral intake and extremely poor physical conditioning her long-term prognosis appears to be extremely poor and guarded however family wants to pursue present line of care, I think continuing present line of medical care with dialysis 3 times a week, looking for appropriate placement to SNF is appropriate.  If she declines further I do not think escalation in care will be in the best interest of patient at all.    Data Reviewed:  CBC: Recent Labs  Lab 08/21/21 0851 08/22/21 0618 08/25/21 1403  WBC 16.6* 14.9* 19.5*  NEUTROABS 13.2*  --   --   HGB 8.5* 8.5* 9.0*  HCT 27.8* 28.2* 31.0*  MCV 89.7 91.0 90.9  PLT 253 270 346    Scheduled Meds:  apixaban  5 mg Oral BID   Chlorhexidine Gluconate Cloth  6 each Topical Q0600   darbepoetin (ARANESP) injection - DIALYSIS  200 mcg Intravenous Q Fri-HD   feeding supplement  237 mL Oral TID BM   gabapentin  100 mg Oral Q12H   methocarbamol  500 mg Oral TID   mirtazapine  7.5 mg Oral QHS   OLANZapine zydis  2.5 mg Oral QHS   predniSONE  10 mg Oral BID WC   sodium chloride flush  10-40 mL Intracatheter Q12H   venlafaxine  37.5 mg Oral BID     Active Problems:   Essential hypertension   Type 2 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, with long-term current use of insulin (HCC)   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   ESRD on dialysis (HCC)   Dyslipidemia   Stage 2 skin ulcer of sacral region ([place])   Cerebral thrombosis with cerebral infarction   Protein-calorie  malnutrition, severe   Acute GI bleeding   Failure to thrive in adult   Rectal bleeding   Delirium due to another medical condition   Neurogenic dysphagia   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

## 2021-08-26 NOTE — Progress Notes (Signed)
Occupational Therapy Treatment Patient Details Name: Amy [name] MRN: [id number] DOB: July 19, 1946 Today's Date: 08/26/2021   History of present illness 75 y.o. female admitted from short-term rehab at SNF on 05/19/21 with dark stool and fecal occult. S/p EGD and sigmoidoscopy which showed stercoral ulcers. Plan for PEG tube placement 8/4. Pt with decline in the past few months with failure to thrive, acute metabolic encephalopathy. 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 PMH includes ESRD (on HD), HTN, CVA (residual L-side hemiparesis).   OT comments  Pt continues to require Total A for bed mobility and toilet hygiene (after bowel incontinence). Discussed OOB schedule with nursing staff. Plan for OOB around noon each day with nursing and minimal of OOB in recliner for one hour this week. Also, ordering geo-mat for pressure relief. Total A with use of lift OOB to recliner. Continue to recommend dc to long term care and will continue to follow for assisting nursing staff in establishing OOB activity.    Recommendations for follow up therapy are one component of a multi-disciplinary discharge planning process, led by the attending physician.  Recommendations may be updated based on patient status, additional functional criteria and insurance authorization.    Follow Up Recommendations  Long-term institutional care without follow-up therapy    Assistance Recommended at Discharge Frequent or [place] [facility] bed;Other (comment) (hoyer lift)    Recommendations for Other Services Other (comment) (Palliative)    Precautions / Restrictions Precautions Precautions: Fall Precaution Comments: bladder/bowel incontinence; L-side hemiplegia, L-side inattention; hallucinating       Mobility Bed Mobility Overal bed mobility: Needs Assistance Bed Mobility: Rolling Rolling: Total assist         General bed mobility comments: able to  grip bed rail when rolled on L side    Transfers Overall transfer level: Needs assistance                 General transfer comment: totalA hoyer lift transfer Transfer via Lift Equipment: Maximove   Balance Overall balance assessment: Needs assistance Sitting-balance support: Feet supported Sitting balance-Leahy Scale: Zero Sitting balance - Comments: pt requires use of pillows to prevent excess rightward lean when seated with back support in recliner Postural control: Right lateral lean                                 ADL either performed or assessed with clinical judgement   ADL Overall ADL's : Needs assistance/impaired Eating/Feeding: Total assistance Eating/Feeding Details (indicate cue type and reason): Total A for bringing cup to mouth with straw                                 Functional mobility during ADLs: Total assistance General ADL Comments: Total A for rolling and peri care after bowel incontinence. Total A for maxi-move to recliner.     Vision   Additional Comments: R gaze   Perception     Praxis      Cognition Arousal/Alertness: Awake/alert Behavior During Therapy: Anxious Overall Cognitive Status: No family/caregiver present to determine baseline cognitive functioning                                 General Comments: pt perseverates on pain with light touch and mobility, repeating "ouch" often  during session. Pt is able to report details about her family, but therapist is unable to confirm. Pt seems to have reduced awareness of deficits at this time.          Exercises     Shoulder Instructions       General Comments bowel incontinence. Discussed with RN staff about mobility schedule for OOB. Agreeable to transfer OOB around noon time each day and have pt out of bed for 1 hour this week at a minimum.    Pertinent Vitals/ Pain       Pain Assessment: Faces Faces Pain Scale: Hurts even more Pain  Location: L hip, pt often reports pain to light touch in multiple areas across body Pain Descriptors / Indicators: Discomfort;Grimacing Pain Intervention(s): Monitored during session;Limited activity within patient's tolerance;Repositioned  Home Living                                          Prior Functioning/Environment              Frequency  Min 1X/week        Progress Toward Goals  OT Goals(current goals can now be found in the care plan section)  Progress towards OT goals: Progressing toward goals  Acute Rehab OT Goals Patient Stated Goal: Unstated OT Goal Formulation: Patient unable to participate in goal setting Time For Goal Achievement: 09/04/21 Potential to Achieve Goals: Good ADL Goals Pt/caregiver will Perform Home Exercise Program: Increased ROM;Both right and left upper extremity;With minimal assist Additional ADL Goal #1: Pt will tolerate OOB in recliner for 1 hour in preparation for tolerating dialysis Additional ADL Goal #2: Pt caregiver will verbalize understanding of positioning techinques with Min cues  Plan Discharge plan remains appropriate    Co-evaluation                 AM-PAC OT "6 Clicks" Daily Activity     Outcome Measure   Help from another person eating meals?: Total Help from another person taking care of personal grooming?: Total Help from another person toileting, which includes using toliet, bedpan, or urinal?: Total Help from another person bathing (including washing, rinsing, drying)?: Total Help from another person to put on and taking off regular upper body clothing?: Total Help from another person to put on and taking off regular lower body clothing?: Total 6 Click Score: 6    End of Session Equipment Utilized During Treatment: Other (comment) (lift)  OT Visit Diagnosis: Unsteadiness on feet (R26.81);Other abnormalities of gait and mobility (R26.89);Muscle weakness (generalized) (M62.81);Feeding  difficulties (R63.3);Hemiplegia and hemiparesis;Pain   Activity Tolerance Patient tolerated treatment well   Patient Left in chair;with call bell/phone within reach   Nurse Communication Mobility status        Time: 0277-4128 OT Time Calculation (min): 42 min  Charges: OT General Charges $OT Visit: 1 Visit OT Treatments $Self Care/Home Management : 38-52 mins  [place], OTR/L Acute Rehab Pager: [phone] Office: [place] 08/26/2021, 3:17 PM

## 2021-08-26 NOTE — TOC Progression Note (Signed)
Transition of Care [facility]) - Progression Note    Patient Details  Name: Amy [name] MRN: [id number] Date of Birth: 1946/05/06  Transition of Care [facility]) CM/SW [place], RN Phone Number: 08/26/2021, 11:48 AM  Clinical Narrative:    CM and MSW with DTP will continue to follow the patient for TOC needs.  The patient's family has requested that hemodialysis treatments continue and have declined admission to Inpatient hospice care facility.  The patient will need to be able to tolerate sitting up for dialysis treatments in order to transition to a LTC facility for care at this time.  The patient will remain an inpatient until she is able to tolerate HD sessions in sitting position in order to be considered for outpatient HD.    Expected Discharge Plan: [place] Barriers to Discharge: Continued Medical Work up  Expected Discharge Plan and Services Expected Discharge Plan: [place] In-house Referral: Clinical Social Work     Living arrangements for the past 2 months: [place] (Patient came to hospital from [place])                                       Social Determinants of Health (SDOH) Interventions    Readmission Risk Interventions Readmission Risk Prevention Plan 08/04/2021  Transportation Screening Complete  Medication Review Press photographer) Complete  PCP or Specialist appointment within 3-5 days of discharge Complete  HRI or Home Care Consult Complete  SW Recovery Care/Counseling Consult Complete  Palliative Care Screening Complete  Skilled Nursing Facility Complete  Some recent data might be hidden

## 2021-08-26 NOTE — Progress Notes (Signed)
Inpatient Diabetes Program Recommendations  AACE/ADA: New Consensus Statement on Inpatient Glycemic Control (2015)  Target Ranges:  Prepandial:   less than 140 mg/dL      Peak postprandial:   less than 180 mg/dL (1-2 hours)      Critically ill patients:  140 - 180 mg/dL   Lab Results  Component Value Date   GLUCAP 246 (H) 08/26/2021   HGBA1C 5.1 05/28/2021    Review of Glycemic Control Results for Amy [name], Amy [name] (MRN [id number]) as of 08/26/2021 09:23  Ref. Range 08/25/2021 06:47 08/25/2021 11:34 08/25/2021 20:45 08/26/2021 06:49  Glucose-Capillary Latest Ref Range: 70 - 99 mg/dL 141 (H) 215 (H) 214 (H) 246 (H)   Diabetes history: DM 2 Outpatient Diabetes medications: None Current orders for Inpatient glycemic control:  None  Ensure Enlive tid between meals PO prednisone 10 mg bid  Inpatient Diabetes Program Recommendations:    Glucose trends in mid 200 range.  - Pt may benefit from very sensitive Novolog correction 0-6 units tid  Thanks,  Tama Headings RN, MSN, BC-ADM Inpatient Diabetes Coordinator Team Pager [phone] (8a-5p)

## 2021-08-27 DIAGNOSIS — R627 Adult failure to thrive: Secondary | ICD-10-CM | POA: Diagnosis not present

## 2021-08-27 DIAGNOSIS — R1319 Other dysphagia: Secondary | ICD-10-CM | POA: Diagnosis not present

## 2021-08-27 DIAGNOSIS — N186 End stage renal disease: Secondary | ICD-10-CM | POA: Diagnosis not present

## 2021-08-27 DIAGNOSIS — E43 Unspecified severe protein-calorie malnutrition: Secondary | ICD-10-CM | POA: Diagnosis not present

## 2021-08-27 LAB — GLUCOSE, CAPILLARY
Glucose-Capillary: 285 mg/dL — ABNORMAL HIGH (ref 70–99)
Glucose-Capillary: 258 mg/dL — ABNORMAL HIGH (ref 70–99)
Glucose-Capillary: 347 mg/dL — ABNORMAL HIGH (ref 70–99)
Glucose-Capillary: 274 mg/dL — ABNORMAL HIGH (ref 70–99)

## 2021-08-27 MED ORDER — NALOXONE HCL 0.4 MG/ML IJ SOLN
INTRAMUSCULAR | Status: AC
  Filled 2021-08-27: qty 1

## 2021-08-27 MED ORDER — NALOXONE HCL 0.4 MG/ML IJ SOLN
0.1000 mg | INTRAMUSCULAR | Status: AC | PRN
  Administered 2021-08-27: 0.1 mg via INTRAMUSCULAR

## 2021-08-27 MED ORDER — KETOROLAC TROMETHAMINE 15 MG/ML IJ SOLN: 15.0000 mg | Freq: Four times a day (QID) | INTRAMUSCULAR | Status: AC | PRN

## 2021-08-27 MED ORDER — PREDNISONE 5 MG PO TABS
5.0000 mg | ORAL_TABLET | Freq: Every day | ORAL | Status: DC
Start: 2021-08-27 — End: 2021-09-05
  Administered 2021-08-27 – 2021-09-04 (×7): 5 mg via ORAL
  Filled 2021-08-27 (×8): qty 1

## 2021-08-27 MED ORDER — HYDROCODONE-ACETAMINOPHEN 5-325 MG PO TABS
1.0000 | ORAL_TABLET | Freq: Once | ORAL | Status: AC
  Administered 2021-08-27: 1 via ORAL
  Filled 2021-08-27: qty 1

## 2021-08-27 MED ORDER — METHOCARBAMOL 1000 MG/10ML IJ SOLN
500.0000 mg | Freq: Four times a day (QID) | INTRAVENOUS | Status: DC | PRN
  Filled 2021-08-27: qty 5

## 2021-08-27 NOTE — Progress Notes (Signed)
[name] PROGRESS NOTE  Amy [name] LZJ:[id number] DOB: 1946-06-22 DOA: 05/19/2021 PCP: [name], MD                                        10/28 trying to smile during hemodialysis treatment difficult due to positioning in bed   Status:  Remains inpatient appropriate because: Unsafe d/c plan, IV treatments appropriate due to intensity of illness or inability to take PO, and Inpatient level of care appropriate due to severity of illness patient continues to require dialysis as requested by family and until she is able to transition to the standards of outpatient dialysis she must remain in the hospital.  Barriers to discharge:  Patient needs to be able to tolerate transport to an outpatient dialysis center.  She would either need to be able to tolerate sitting up in a wheelchair for transport to the center or via stretcher.  She needs to demonstrate tolerance to sitting in recliner for 3-1/2 to 4 hours. Given her refusal to eat institutional food and her preference for home-cooked meals best discharge option would be home with family for 24/7 care if they are able to obtain private pay home health services   Level of care:  Med-Surg  Code Status: DNR Family communication: 10/28 updated both son and goddaughter Amy [name] by telephone today. DVT prophylaxis: Eliquis COVID vaccination status: [name] 02/26/2020, Pfizer 04/10/2021    HPI: 75 y.o. 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 Patient presented to the ED on 7/25 with dark stool.  On presentation, hemoglobin was 9.6; GI was consulted.  She underwent EGD which was normal and flexible sigmoidoscopy which  showed nonbleeding distal rectal ulcers/stercoral ulcers.  Subsequently, GI signed off.  Neurology was also consulted for altered mental status; MRI brain was similar to prior MRI at [facility] with a large right MCA stroke.  EEG this admission unremarkable. PEG tube was desired by family but could not be placed because of overlying transverse colon.  General surgery was consulted for G-tube placement. Patient deemed high risk for open placement of gastrostomy tube.  Subsequently she has suffered another stroke which precludes her from any surgical procedures including open G-tube placement.  This was discussed with her son who was in agreement.  Unfortunately he was more conflicted about stopping dialysis noting prior to the stroke and subsequently since that time each time he is asked his mother regarding stopping dialysis she has told him that she does not want to die and wants to continue dialysis.  Consideration given to transitioning to residential hospice once family in agreement about how to proceed with end-of-life issues.  Patient did have an episode of decline immediately following dialysis on 10/20 but subsequently recovered.  She continues not to eat and intermittently takes medications by mouth.  At this point since patient remains relatively stable despite not eating plan is to determine if she is able to tolerate dialysis in a chair which is a requirement for outpatient hemodialysis.  Once this is accomplished we will need to pursue discharge disposition either to home with family caring for patient or to SNF with family paying out-of-pocket  for care.   Subjective: Drowsy but will awaken and speak briefly.  Apparently was having severe pain overnight prompting on-call staff to order a one-time dose of Vicodin.  Patient very dehydrated as noted by extremely dry oral mucous membranes.  No medication given patient had brief O2 sats to 34% prompting application of nasal cannula oxygen and blood pressure  went down to 82/43.  Attending physician ordered IM Narcan x1.  Subsequently vital signs and saturations have improved although patient remains slightly sleepy  Objective: Vitals:   08/26/21 2104 08/27/21 0520  BP: (!) 147/120 (!) 82/43  Pulse: 95 66  Resp: 13 16  Temp: 98.1 F (36.7 C) 99.8 F (37.7 C)  SpO2: 100% (!) 76%    Intake/Output Summary (Last 24 hours) at 08/27/2021 1962 Last data filed at 08/26/2021 2200 Gross per 24 hour  Intake 237 ml  Output 0 ml  Net 237 ml    Filed Weights   08/22/21 2024  Weight: 93.4 kg    Exam:  Constitutional: Somnolent today, briefly awakens Respiratory: Has weaned from 2 L oxygen to room air with current O2 saturations 100%, no increased work of breathing, lungs are clear but diminished in the bases Cardiovascular: S1-S2, pulse regular and tachycardic, continues with bilateral UEs edema which is chronic in nature. Abdomen: LBM 10/31, abdomen soft nontender nondistended.  Bowel sounds +, has had 0 intake of foods although best day for oral fluids was on 10/31 was 727 cc but typically averages between 0 and 250 cc of fluid per day Neurologic: CN 2-12 grossly intact on visual inspection-sensation remains intact, very limited spontaneous movement.  Unable to test strength but appears to be 1/5 x 4 extremities Psychiatric: Awake.  Oriented times person and place  Assessment/Plan: Acute problems: Sepsis physiology/RLL HCAP/Abn blood cxs c/w contaminant Completed 7 days of Maxipime  Failure to thrive with generalized deconditioning in context of neurogenic dysphagia Given acute stroke patient is no longer a candidate for open G-tube placement.  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-recurrent acute on subacute stroke Recent MRI 10/10 revealed right posterior frontal, posterior temporal, parietal and occipital acute versus subacute infarct Continue Eliquis   LUE and RUE edema 2/2 superficial thrombophlebitis LUE edema chronic in nature Venous duplex neg for DVT but does demonstrate superficial thrombophlebitis Continue full dose anticoagulation   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  Given recent and recurrent stroke she is not a candidate for operative placement/open G-tube  Suspected dementia/recent delirium Continue Remeron and Effexor  ESRD on HD Continues to tolerate dialysis with intermittent episodes of tachycardia likely related to dehydration from poor oral intake Continue therapy and nursing interventions to promote ability to sit up in chair so can tolerate outpatient hemodialysis  Neck pain Neck pain continues and she is also noted to have more diffuse pain but developed hypotension and hypoxemia with narcotic pain medications Quite somnolent today so we will change Robaxin back to IV and add as needed Toradol  Type 2 diabetes mellitus Episodes of hypoglycemia Hemoglobin A1c 5.1.  Follow CBGs and provide SSI as indicated Not eating enough for long-acting insulin Continue Neurontin for peripheral neuropathy  Stage II mid coccyx pressure injury, present on admission CT pelvis 10/10 without evidence of abscess Continue local wound care-given poor nutrition and is doubtful this wound will ever heal and likely will only get worse due to her concomitant lack of mobility        Data Reviewed:  CBC: Recent Labs  Lab 08/21/21 0851 08/22/21 0618 08/25/21 1403  WBC 16.6*  14.9* 19.5*  NEUTROABS 13.2*  --   --   HGB 8.5* 8.5* 9.0*  HCT 27.8* 28.2* 31.0*  MCV 89.7 91.0 90.9  PLT 253 270 346     CBG: Recent Labs  Lab 08/25/21 2045 08/26/21 0649 08/26/21 1125 08/26/21 1600 08/26/21 2107  GLUCAP 214* 246* 206* 239* 242*     Scheduled Meds:  apixaban  5 mg Oral BID   Chlorhexidine Gluconate Cloth  6 each Topical Q0600   darbepoetin (ARANESP) injection - DIALYSIS  200 mcg Intravenous Q Fri-HD   feeding supplement  237 mL Oral TID BM   gabapentin  100 mg Oral Q12H   methocarbamol  500 mg Oral TID   mirtazapine  7.5 mg Oral QHS   naloxone       OLANZapine zydis  2.5 mg Oral QHS   predniSONE  5 mg Oral Q breakfast   sodium chloride flush  10-40 mL Intracatheter Q12H   venlafaxine  37.5 mg Oral BID     Active Problems:   Essential hypertension   Type 2 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, with long-term current use of insulin (HCC)   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   ESRD on dialysis ([place])   Dyslipidemia   Stage 2 skin ulcer of sacral region ([place])   Cerebral thrombosis with cerebral infarction   Protein-calorie malnutrition, severe   Acute GI bleeding   Failure to thrive in adult   Rectal bleeding   Delirium due to another medical condition   Neurogenic dysphagia   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 ([place])   HCAP (healthcare-associated pneumonia)   Sepsis due to other etiology [facility])   Recurrent cerebrovascular accidents (CVAs) ([place])   End of life care   Consultants: Vascular surgery Nephrology Gastroenterology Neurology Palliative medicine General surgery  Procedures: EEG Echocardiogram Cortrack EGD  Antibiotics: Ceftriaxone 8/3 through 8/5   Time spent: 15 minutes    Erin Hearing ANP  [facility] 7 am - 330 pm/M-F for direct patient care and secure chat Please refer to Amion for contact info 99   days

## 2021-08-27 NOTE — Progress Notes (Addendum)
[facility] Progress Note   Subjective: Seen in room. Again minimally responsive. Required NARCAN this AM for narcotic use overnight. HD tomorrow on M-Th schedule.      Objective Vitals:   08/26/21 2104 08/27/21 0520 08/27/21 0855 08/27/21 1108  BP: (!) 147/120 (!) 82/43 (!) 99/51 (!) 109/57  Pulse: 95 66 88 96  Resp: 13 16  16   Temp: 98.1 F (36.7 C) 99.8 F (37.7 C)  97.6 F (36.4 C)  TempSrc: Oral Oral  Oral  SpO2: 100% (!) 76% 100% 100%  Weight:      Height:       Physical Exam General: Chronically ill appearing female in NAD Heart: S1,S2 RRR No M/R/G Lungs: CTAB No WOB Abdomen: NABS, NT Extremities:No LE edema Dialysis Access: LIJ TDC Drsg intact     Dialysis Orders:  Additional Objective Labs: Basic Metabolic Panel: Recent Labs  Lab 08/21/21 0851 08/22/21 0618 08/25/21 1403  NA 137 137 139  K 3.4* 3.4* 4.8  CL 102 100 100  CO2 28 29 29   GLUCOSE 96 118* 285*  BUN 10 15 21   CREATININE 2.55* 3.28* 3.91*  CALCIUM 8.1* 8.2* 8.0*  PHOS  --  1.4* 1.7*   Liver Function Tests: Recent Labs  Lab 08/21/21 0851 08/22/21 0618 08/25/21 1403  AST 13*  --   --   ALT 10  --   --   ALKPHOS 95  --   --   BILITOT 0.9  --   --   PROT 5.6*  --   --   ALBUMIN 1.6* 1.7* 1.7*   No results for input(s): LIPASE, AMYLASE in the last 168 hours. CBC: Recent Labs  Lab 08/21/21 0851 08/22/21 0618 08/25/21 1403  WBC 16.6* 14.9* 19.5*  NEUTROABS 13.2*  --   --   HGB 8.5* 8.5* 9.0*  HCT 27.8* 28.2* 31.0*  MCV 89.7 91.0 90.9  PLT 253 270 346   Blood Culture    Component Value Date/Time   SDES BLOOD RIGHT HAND 08/21/2021 0851   SDES BLOOD LEFT HAND 08/21/2021 0851   SPECREQUEST  08/21/2021 0851    BOTTLES DRAWN AEROBIC AND ANAEROBIC Blood Culture adequate volume   SPECREQUEST  08/21/2021 0851    BOTTLES DRAWN AEROBIC AND ANAEROBIC Blood Culture adequate volume   CULT  08/21/2021 0851    NO GROWTH 5 DAYS Performed at [facility] [address] [place], [place] [address]    CULT  08/21/2021 [phone]    NO GROWTH 5 DAYS Performed at [facility], [place] [address] [place], [place] [address]    REPTSTATUS 08/26/2021 FINAL 08/21/2021 0851   REPTSTATUS 08/26/2021 FINAL 08/21/2021 0851    Cardiac Enzymes: No results for input(s): CKTOTAL, CKMB, CKMBINDEX, TROPONINI in the last 168 hours. CBG: Recent Labs  Lab 08/26/21 0649 08/26/21 1125 08/26/21 1600 08/26/21 2107 08/27/21 1108  GLUCAP 246* 206* 239* 242* 258*   Iron Studies: No results for input(s): IRON, TIBC, TRANSFERRIN, FERRITIN in the last 72 hours. @lablastinr3 @ Studies/Results: No results found. Medications:  methocarbamol (ROBAXIN) IV      apixaban  5 mg Oral BID   Chlorhexidine Gluconate Cloth  6 each Topical Q0600   darbepoetin (ARANESP) injection - DIALYSIS  200 mcg Intravenous Q Fri-HD   feeding supplement  237 mL Oral TID BM   gabapentin  100 mg Oral Q12H   mirtazapine  7.5 mg Oral QHS   OLANZapine zydis  2.5 mg Oral QHS  predniSONE  5 mg Oral Q breakfast   sodium chloride flush  10-40 mL Intracatheter Q12H   venlafaxine  37.5 mg Oral BID     Dialysis Orders: AF MWF  3h 37min 400/500 62.5kg 3K/2.5 Ca P2 AVG HeRO    --No Heparin - Mircera 50 mcg IV q 2 wks  (7/13)   --Venofer 50mg  IV  q wk - Parsabiv 5mg  IV q HD - not on [facility] formulary   Assessment/Plan: AMS/FTT: Mental status waxes/wanes.  Family had decided on comfort measures and has now decided they would like her to continue HD. PT evaluating. Rec'd narcotics overnight 11/01 and required narcan this AM.  Anemia/Rectal bleeding - GI consulted. Hx rectal ulcers.  EGD 7/27 normal, flex sig with mucosal ulceration c/w sterocoral ulcers, non bleeding. Dropped to 6.5 on 10/10. S/p prbcs on 10/10. Hgb now in the 8-9 range. Continue aranesp q Friday.  Leukocytosis -- RLL HCAP, BC with stap epi. Rec'd Maxipime for one week. Per primary.  ESRD: HD was stopped 08/04/2021. Plan was for comfort  measures only. However family rescinded plan. Patient resumed HD 08/14/2021 per family wishes. Now on MWF schedule. SCr 2.5-3.2. Start  Biweekly HD on Monday and Thursday, next HD 08/28/2021.  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 AVG clotted 06/27/21, unable to be declotted d/t arm contracture. [place] placed 07/02/21.  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  C Ca 9.84 PO4 1.7 08/25/2021. Currently on oral K+Phos.  Nutrition: Alb very low 1.7. Not a candidate for feeding tube. Poor PO intake. Recent CVA with no evidence of meaningful recovery Psych eval from 9/14 determined that patient has functional mental capacity for medical decision-making. Psych re-consulted 9/23 to evaluate the patient again - at this point, patient does not have the capacity to make medical decisions. Ongoing discussions with patient's son continues Dispo: DNR/DNI. Poor prognosis. Family was interested in hospice and patient was on comfort measures, family has now changed their mind and has not been very responsive to palliative care. Patient will need to be strong enough to tolerate dialysis in a recliner before she can be discharged to outpatient. We have been clear that we do not believe hemodialysis is contributing to quality of this woman's life.   Velva Harman H. Brown NP-C 08/27/2021, 11:33 AM  [facility] [phone]  Pt seen, examined and agree w A/P as above.  Kelly Splinter  MD 08/27/2021, 3:00 PM

## 2021-08-27 NOTE — Progress Notes (Signed)
Physical Therapy Treatment Patient Details Name: Amy [name] MRN: [id number] DOB: 1945-12-17 Today's Date: 08/27/2021   History of Present Illness 75 y.o. female admitted from short-term rehab at SNF on 05/19/21 with dark stool and fecal occult. S/p EGD and sigmoidoscopy which showed stercoral ulcers. Plan for PEG tube placement 8/4. Pt with decline in the past few months with failure to thrive, acute metabolic encephalopathy. 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 PMH includes ESRD (on HD), HTN, CVA (residual L-side hemiparesis).    PT Comments    Pt tolerated 3.5 hours up in the chair today; performed maxi move lift transfer back to the bed. Provided AAROM/PROM to right upper extremity and bilateral lower extremities, in addition to cervical stretching. Pt continuously stating "ouch," with all movements. Daily lifting schedule has been implemented to increase sitting tolerance for dialysis treatments.    Recommendations for follow up therapy are one component of a multi-disciplinary discharge planning process, led by the attending physician.  Recommendations may be updated based on patient status, additional functional criteria and insurance authorization.  Follow Up Recommendations  Long-term institutional care without follow-up therapy     Assistance Recommended at Discharge Frequent or [facility] bed;Other (comment) (hoyer lift)    Recommendations for Other Services       Precautions / Restrictions Precautions Precautions: Fall;Other (comment) Precaution Comments: L hemiplegia Restrictions Weight Bearing Restrictions: No     Mobility  Bed Mobility                    Transfers                        Ambulation/Gait                 Stairs             Wheelchair Mobility    Modified Rankin (Stroke Patients Only)       Balance                                             Cognition Arousal/Alertness: Awake/alert Behavior During Therapy: Anxious Overall Cognitive Status: No family/caregiver present to determine baseline cognitive functioning                                 General Comments: Pt stating "ouch," frequently        Exercises General Exercises - Upper Extremity Elbow Flexion: AAROM;Right;10 reps;Supine Elbow Extension: AAROM;Right;10 reps;Supine Wrist Flexion: AAROM;Right;10 reps;Supine Wrist Extension: AAROM;Right;10 reps;Supine General Exercises - Lower Extremity Ankle Circles/Pumps: Both;AAROM;10 reps;Supine Heel Slides: 10 reps;Right;Left;PROM Other Exercises Other Exercises: Bilateral cervical lateral flexion and rotation stretch    General Comments        Pertinent Vitals/Pain Pain Assessment: Faces Faces Pain Scale: Hurts even more Pain Location: generalized with maxi move lift transfer and ROM Pain Descriptors / Indicators: Discomfort;Grimacing Pain Intervention(s): Limited activity within patient's tolerance;Monitored during session;Repositioned    Home Living                          Prior Function            PT Goals (current goals can now be found in the care plan section) Acute  Rehab PT Goals Patient Stated Goal: none stated Potential to Achieve Goals: Poor Progress towards PT goals: Progressing toward goals    Frequency    Min 1X/week      PT Plan Current plan remains appropriate    Co-evaluation              AM-PAC PT "6 Clicks" Mobility   Outcome Measure  Help needed turning from your back to your side while in a flat bed without using bedrails?: Total Help needed moving from lying on your back to sitting on the side of a flat bed without using bedrails?: Total Help needed moving to and from a bed to a chair (including a wheelchair)?: Total Help needed standing up from a chair using your arms (e.g., wheelchair or bedside chair)?:  Total Help needed to walk in hospital room?: Total Help needed climbing 3-5 steps with a railing? : Total 6 Click Score: 6    End of Session   Activity Tolerance: Patient limited by pain Patient left: in bed;with call bell/phone within reach Nurse Communication: Mobility status PT Visit Diagnosis: Other abnormalities of gait and mobility (R26.89);Hemiplegia and hemiparesis;Other symptoms and signs involving the nervous system (R29.898) Hemiplegia - Right/Left: Left     Time: 9357-0177 PT Time Calculation (min) (ACUTE ONLY): 21 min  Charges:  $Therapeutic Exercise: 8-22 mins                     Wyona Almas, PT, DPT Acute Rehabilitation Services Pager [phone] Office [phone]    Deno Etienne 08/27/2021, 4:46 PM

## 2021-08-27 NOTE — Progress Notes (Signed)
Patient found somnolent upon assessment, Night RN reported that Pt. Received Narcotics last night from complaints of neck & shoulder pain. Md ordered 1mg  or Narcan IM.  Pt reassessed, alert  and easy to arouse, took her meds with Ensure and ate some magic cup nourishment.

## 2021-08-28 DIAGNOSIS — N186 End stage renal disease: Secondary | ICD-10-CM | POA: Diagnosis not present

## 2021-08-28 DIAGNOSIS — I639 Cerebral infarction, unspecified: Secondary | ICD-10-CM | POA: Diagnosis not present

## 2021-08-28 DIAGNOSIS — R627 Adult failure to thrive: Secondary | ICD-10-CM | POA: Diagnosis not present

## 2021-08-28 DIAGNOSIS — E43 Unspecified severe protein-calorie malnutrition: Secondary | ICD-10-CM | POA: Diagnosis not present

## 2021-08-28 LAB — RENAL FUNCTION PANEL
Albumin: 1.7 g/dL — ABNORMAL LOW (ref 3.5–5.0)
Anion gap: 9 (ref 5–15)
BUN: 33 mg/dL — ABNORMAL HIGH (ref 8–23)
CO2: 30 mmol/L (ref 22–32)
Calcium: 8.6 mg/dL — ABNORMAL LOW (ref 8.9–10.3)
Chloride: 99 mmol/L (ref 98–111)
Creatinine, Ser: 4.28 mg/dL — ABNORMAL HIGH (ref 0.44–1.00)
GFR, Estimated: 10 mL/min — ABNORMAL LOW (ref >60)
Glucose, Bld: 173 mg/dL — ABNORMAL HIGH (ref 70–99)
Phosphorus: 1.1 mg/dL — ABNORMAL LOW (ref 2.5–4.6)
Potassium: 5.4 mmol/L — ABNORMAL HIGH (ref 3.5–5.1)
Sodium: 138 mmol/L (ref 135–145)

## 2021-08-28 LAB — CBC
HCT: 28.6 % — ABNORMAL LOW (ref 36.0–46.0)
Hemoglobin: 8.5 g/dL — ABNORMAL LOW (ref 12.0–15.0)
MCH: 26.6 pg (ref 26.0–34.0)
MCHC: 29.7 g/dL — ABNORMAL LOW (ref 30.0–36.0)
MCV: 89.7 fL (ref 80.0–100.0)
Platelets: 385 10*3/uL (ref 150–400)
RBC: 3.19 MIL/uL — ABNORMAL LOW (ref 3.87–5.11)
RDW: 19.5 % — ABNORMAL HIGH (ref 11.5–15.5)
WBC: 19.3 10*3/uL — ABNORMAL HIGH (ref 4.0–10.5)
nRBC: 0.3 % — ABNORMAL HIGH (ref 0.0–0.2)

## 2021-08-28 LAB — GLUCOSE, CAPILLARY
Glucose-Capillary: 167 mg/dL — ABNORMAL HIGH (ref 70–99)
Glucose-Capillary: 191 mg/dL — ABNORMAL HIGH (ref 70–99)
Glucose-Capillary: 212 mg/dL — ABNORMAL HIGH (ref 70–99)

## 2021-08-28 MED ORDER — HEPARIN SODIUM (PORCINE) 1000 UNIT/ML DIALYSIS: 20.0000 [IU]/kg | INTRAMUSCULAR | Status: DC | PRN

## 2021-08-28 MED ORDER — SODIUM CHLORIDE 0.9 % IV SOLN: 100.0000 mL | INTRAVENOUS | Status: DC | PRN

## 2021-08-28 MED ORDER — LIDOCAINE HCL (PF) 1 % IJ SOLN: 5.0000 mL | INTRAMUSCULAR | Status: DC | PRN

## 2021-08-28 MED ORDER — ALTEPLASE 2 MG IJ SOLR: 2.0000 mg | Freq: Once | INTRAMUSCULAR | Status: DC | PRN

## 2021-08-28 MED ORDER — PENTAFLUOROPROP-TETRAFLUOROETH EX AERO: 1.0000 "application " | INHALATION_SPRAY | CUTANEOUS | Status: DC | PRN

## 2021-08-28 MED ORDER — HEPARIN SODIUM (PORCINE) 1000 UNIT/ML DIALYSIS: 1000.0000 [IU] | INTRAMUSCULAR | Status: DC | PRN

## 2021-08-28 MED ORDER — LIDOCAINE-PRILOCAINE 2.5-2.5 % EX CREA: 1.0000 "application " | TOPICAL_CREAM | CUTANEOUS | Status: DC | PRN

## 2021-08-28 NOTE — Progress Notes (Addendum)
[facility] Progress Note   Subjective: Seen on HD. Alternately saying "help me, help me, help me" but then was able to say, "I love you, I know you help me". (She was pt at [facility]). Tolerating HD without issues.   Objective Vitals:   08/28/21 0930 08/28/21 1000 08/28/21 1030 08/28/21 1100  BP: 116/61 100/60 (!) 97/53 103/62  Pulse: (!) 101 (!) 112 (!) 112 (!) 104  Resp: (!) 25 (!) 21 14 (!) 22  Temp:      TempSrc:      SpO2:      Weight:      Height:       Physical Exam General: Chronically ill appearing female in NAD Heart: S1,S2 RRR No M/R/G Lungs: CTAB No WOB Abdomen: NABS, NT Extremities:No LE edema Dialysis Access: [facility] Drsg intact      Additional Objective Labs: Basic Metabolic Panel: Recent Labs  Lab 08/22/21 0618 08/25/21 1403 08/28/21 0951  NA 137 139 138  K 3.4* 4.8 5.4*  CL 100 100 99  CO2 29 29 30   GLUCOSE 118* 285* 173*  BUN 15 21 33*  CREATININE 3.28* 3.91* 4.28*  CALCIUM 8.2* 8.0* 8.6*  PHOS 1.4* 1.7* 1.1*   Liver Function Tests: Recent Labs  Lab 08/22/21 0618 08/25/21 1403 08/28/21 0951  ALBUMIN 1.7* 1.7* 1.7*   No results for input(s): LIPASE, AMYLASE in the last 168 hours. CBC: Recent Labs  Lab 08/22/21 0618 08/25/21 1403 08/28/21 0951  WBC 14.9* 19.5* 19.3*  HGB 8.5* 9.0* 8.5*  HCT 28.2* 31.0* 28.6*  MCV 91.0 90.9 89.7  PLT 270 346 385   Blood Culture    Component Value Date/Time   SDES BLOOD RIGHT HAND 08/21/2021 0851   SDES BLOOD LEFT HAND 08/21/2021 0851   SPECREQUEST  08/21/2021 0851    BOTTLES DRAWN AEROBIC AND ANAEROBIC Blood Culture adequate volume   SPECREQUEST  08/21/2021 0851    BOTTLES DRAWN AEROBIC AND ANAEROBIC Blood Culture adequate volume   CULT  08/21/2021 0851    NO GROWTH 5 DAYS Performed at [facility] [address], [place], [place] [address]    CULT  08/21/2021 [phone]    NO GROWTH 5 DAYS Performed at [facility], [place] [address] [place], [place]  [address]    REPTSTATUS 08/26/2021 FINAL 08/21/2021 0851   REPTSTATUS 08/26/2021 FINAL 08/21/2021 0851    Cardiac Enzymes: No results for input(s): CKTOTAL, CKMB, CKMBINDEX, TROPONINI in the last 168 hours. CBG: Recent Labs  Lab 08/27/21 0709 08/27/21 1108 08/27/21 1614 08/27/21 2120 08/28/21 0647  GLUCAP 285* 258* 347* 274* 167*   Iron Studies: No results for input(s): IRON, TIBC, TRANSFERRIN, FERRITIN in the last 72 hours. @lablastinr3 @ Studies/Results: No results found. Medications:  sodium chloride     sodium chloride     methocarbamol (ROBAXIN) IV      apixaban  5 mg Oral BID   Chlorhexidine Gluconate Cloth  6 each Topical Q0600   darbepoetin (ARANESP) injection - DIALYSIS  200 mcg Intravenous Q Fri-HD   feeding supplement  237 mL Oral TID BM   gabapentin  100 mg Oral Q12H   mirtazapine  7.5 mg Oral QHS   OLANZapine zydis  2.5 mg Oral QHS   predniSONE  5 mg Oral Q breakfast   sodium chloride flush  10-40 mL Intracatheter Q12H   venlafaxine  37.5 mg Oral BID     Dialysis Orders: AF MWF  3h 41min 400/500 62.5kg  3K/2.5 Ca P2 AVG HeRO    --No Heparin - Mircera 50 mcg IV q 2 wks  (7/13)   --Venofer 50mg  IV  q wk - Parsabiv 5mg  IV q HD - not on [facility] formulary   Assessment/Plan: AMS/FTT: Mental status waxes/wanes.  Family had decided on comfort measures and has now decided they would like her to continue HD. PT evaluating. Rec'd narcotics overnight 11/01 and required narcan this AM.  Anemia/Rectal bleeding - GI consulted. Hx rectal ulcers.  EGD 7/27 normal, flex sig with mucosal ulceration c/w sterocoral ulcers, non bleeding. Dropped to 6.5 on 10/10. S/p prbcs on 10/10. Hgb now in the 8-9 range. Continue aranesp q Friday.  Leukocytosis -- RLL HCAP, BC with stap epi. Rec'd Maxipime for one week. Per primary.  ESRD: HD was stopped 08/04/2021. Plan was for comfort measures only. However family rescinded plan. Patient resumed HD 08/14/2021 per family wishes. Now on MWF  schedule. SCr 2.5-3.2. Start  Biweekly HD on Monday and Thursday, next HD 08/28/2021. Next HD 09/01/2021. 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 AVG clotted 06/27/21, unable to be declotted d/t arm contracture. [place] placed 07/02/21.  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  C Ca 9.84 PO4 1.7 08/25/2021. Currently on oral K+Phos.  Nutrition: Alb very low 1.7. Not a candidate for feeding tube. Poor PO intake. Recent CVA with no evidence of meaningful recovery Psych eval from 9/14 determined that patient has functional mental capacity for medical decision-making. Psych re-consulted 9/23 to evaluate the patient again - at this point, patient does not have the capacity to make medical decisions. Ongoing discussions with patient's son continues Dispo: DNR/DNI. Poor prognosis. Family was interested in hospice and patient was on comfort measures, family has now changed their mind and has not been very responsive to palliative care. Patient will need to be strong enough to tolerate dialysis in a recliner before she can be discharged to outpatient. We have been clear that we do not believe hemodialysis is contributing to quality of this woman's life.   Amy Lavallie H. Lakea Mittelman NP-C 08/28/2021, 11:45 AM  [employer] [phone]

## 2021-08-28 NOTE — Progress Notes (Addendum)
Inpatient Diabetes Program Recommendations  AACE/ADA: New Consensus Statement on Inpatient Glycemic Control (2015)  Target Ranges:  Prepandial:   less than 140 mg/dL      Peak postprandial:   less than 180 mg/dL (1-2 hours)      Critically ill patients:  140 - 180 mg/dL   Lab Results  Component Value Date   GLUCAP 167 (H) 08/28/2021   HGBA1C 5.1 05/28/2021    Review of Glycemic Control Results for Amy [name], Amy [name] (MRN [id number]) as of 08/28/2021 09:37  Ref. Range 08/27/2021 11:08 08/27/2021 16:14 08/27/2021 21:20 08/28/2021 06:47  Glucose-Capillary Latest Ref Range: 70 - 99 mg/dL 258 (H) 347 (H) 274 (H) 167 (H)   Diabetes history: Type 2 DM Outpatient Diabetes medications: none noted Current orders for Inpatient glycemic control: none Prednisone 5 mg QAM  Inpatient Diabetes Program Recommendations:    In the setting of steroids, consider adding Novolog 0-6 units TID & HS.  May also want to change diet to carb modified.  Thanks, Bronson Curb, MSN, RNC-OB Diabetes Coordinator [phone] (8a-5p)

## 2021-08-28 NOTE — Progress Notes (Signed)
[name] PROGRESS NOTE  Amy [name] ZJI:[id number] DOB: [birth year] DOA: 05/19/2021 PCP: [name], MD                                        10/28 trying to smile during hemodialysis treatment difficult due to positioning in bed   Status:  Remains inpatient appropriate because: Unsafe d/c plan, IV treatments appropriate due to intensity of illness or inability to take PO, and Inpatient level of care appropriate due to severity of illness patient continues to require dialysis as requested by family and until she is able to transition to the standards of outpatient dialysis she must remain in the hospital.  Barriers to discharge:  Patient needs to be able to tolerate transport to an outpatient dialysis center.  She needs to demonstrate tolerance to sitting in recliner for 3-1/2 to 4 hours. Given her refusal to eat institutional food and her preference for home-cooked meals best discharge option would be home with family for 24/7 care if they are able to obtain private pay home health services   Level of care:  Med-Surg  Code Status: DNR Family communication: 11/03 updated both son and goddaughter [name] by telephone today.  Made aware that recent lethargy 2/2 regular placement up in chair for 3-1/2 to 4 hours reminding them this is what is required for her to go to outpatient hemodialysis.  Updated that her dialysis schedule is now M Th and reminded them that she is not tolerating dialysis well and is having elevated heart rate due to being dehydrated from not eating and only limited fluid intake DVT prophylaxis: Eliquis COVID vaccination status: [name] 02/26/2020, Pfizer 04/10/2021    HPI: 75 y.o. 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 Patient presented to the ED on 7/25 with dark stool.  On presentation, hemoglobin was 9.6; GI was consulted.  She underwent EGD which was normal and flexible sigmoidoscopy which showed nonbleeding distal rectal ulcers/stercoral ulcers.  Subsequently, GI signed off.  Neurology was also consulted for altered mental status; MRI brain was similar to prior MRI at [facility] with a large right MCA stroke.  EEG this admission unremarkable. PEG tube was desired by family but could not be placed because of overlying transverse colon.  General surgery was consulted for G-tube placement. Patient deemed high risk for open placement of gastrostomy tube.  Subsequently she has suffered another stroke which precludes her from any surgical procedures including open G-tube placement.  This was discussed with her son who was in agreement.  Unfortunately he was more conflicted about stopping dialysis noting prior to the stroke and subsequently since that time each time he is asked his mother regarding stopping dialysis she has told him that she does not want to die and wants to continue dialysis.  Consideration given to transitioning to residential hospice once family in agreement about how to proceed with end-of-life issues.  Patient did have an episode of decline immediately following dialysis on 10/20 but subsequently recovered.  She continues not to eat and intermittently takes medications by mouth.  At this point since patient remains relatively stable despite not eating plan is  to determine if she is able to tolerate dialysis in a chair which is a requirement for outpatient hemodialysis.  Once this is accomplished we will need to pursue discharge disposition either to home with family caring for patient or to SNF with family paying out-of-pocket for care.   Subjective: Awake.  Complaining of discomfort from O2 tubing especially on right ear.   Oxygen removed and spotcheck sats 100%  Objective: Vitals:   08/27/21 2043 08/28/21 0523  BP: (!) 154/54 (!) 115/59  Pulse: 91 (!) 102  Resp: 18 14  Temp: (!) 97.5 F (36.4 C) (!) 97.5 F (36.4 C)  SpO2: 100%     Intake/Output Summary (Last 24 hours) at 08/28/2021 0751 Last data filed at 08/27/2021 2201 Gross per 24 hour  Intake 70 ml  Output --  Net 70 ml    Filed Weights   08/22/21 2024  Weight: 93.4 kg    Exam:  Constitutional: She is lethargic but will awaken and engage somewhat in conversation Respiratory: Room air, saturations 100%, no increased work of breathing, lungs are clear but diminished in the bases Cardiovascular: S1-S2, pulse regular and tachycardic, continues with bilateral UEs edema which is chronic in nature. Abdomen: LBM 10/31, abdomen soft nontender nondistended.  Bowel sounds +, has had 0 intake of foods although best day for oral fluids was on 10/31 was 727 cc .  11/2 only took in 70 cc of fluid Neurologic: CN 2-12 grossly intact on visual inspection-sensation remains intact, very limited spontaneous movement.  Unable to test strength but appears to be 1/5 x 4 extremities Psychiatric: Awake.  Oriented times person and place  Assessment/Plan: Acute problems: Sepsis physiology/RLL HCAP/Abn blood cxs c/w contaminant Completed 7 days of Maxipime  Failure to thrive with generalized deconditioning in context of neurogenic dysphagia Given acute stroke patient is no longer a candidate for open G-tube placement.  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-recurrent acute on subacute stroke Recent MRI 10/10 revealed right posterior frontal, posterior temporal, parietal  and occipital acute versus subacute infarct Continue Eliquis   LUE and RUE edema 2/2 superficial thrombophlebitis LUE edema chronic in nature Venous duplex neg for DVT but does demonstrate superficial thrombophlebitis Continue full dose anticoagulation   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  Given recent and recurrent stroke she is not a candidate for operative placement/open G-tube  Suspected dementia/recent delirium Continue Remeron and Effexor  ESRD on HD Continues to tolerate dialysis with intermittent episodes of tachycardia likely related to dehydration from poor oral intake Continue therapy and nursing interventions to promote ability to sit up in chair so can tolerate outpatient hemodialysis Nephrology has changed her to a Monday Thursday schedule.  They have also documented that in their opinion dialysis is not contributing to this patient's quality of life.  Neck pain Neck pain continues and she is also noted to have more diffuse pain but developed hypotension and hypoxemia with narcotic pain medications Quite somnolent today so we will change Robaxin back to IV and add as needed Toradol  Type 2 diabetes mellitus Episodes of hypoglycemia Hemoglobin A1c 5.1.  Follow CBGs and provide SSI as indicated Not eating enough for long-acting insulin Continue Neurontin for peripheral neuropathy  Stage II mid coccyx pressure injury, present on admission CT pelvis 10/10 without evidence of abscess Continue local wound care-given poor nutrition and is doubtful this wound will ever heal and likely will only get worse due to her concomitant lack of mobility        Data Reviewed:  CBC: Recent Labs  Lab 08/21/21 0851 08/22/21 0618 08/25/21 1403  WBC 16.6* 14.9* 19.5*  NEUTROABS 13.2*   --   --   HGB 8.5* 8.5* 9.0*  HCT 27.8* 28.2* 31.0*  MCV 89.7 91.0 90.9  PLT 253 270 346     CBG: Recent Labs  Lab 08/27/21 0709 08/27/21 1108 08/27/21 1614 08/27/21 2120 08/28/21 0647  GLUCAP 285* 258* 347* 274* 167*     Scheduled Meds:  apixaban  5 mg Oral BID   Chlorhexidine Gluconate Cloth  6 each Topical Q0600   darbepoetin (ARANESP) injection - DIALYSIS  200 mcg Intravenous Q Fri-HD   feeding supplement  237 mL Oral TID BM   gabapentin  100 mg Oral Q12H   mirtazapine  7.5 mg Oral QHS   OLANZapine zydis  2.5 mg Oral QHS   predniSONE  5 mg Oral Q breakfast   sodium chloride flush  10-40 mL Intracatheter Q12H   venlafaxine  37.5 mg Oral BID     Active Problems:   Essential hypertension   Type 2 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, with long-term current use of insulin (HCC)   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   ESRD on dialysis (HCC)   Dyslipidemia   Stage 2 skin ulcer of sacral region ([place])   Cerebral thrombosis with cerebral infarction   Protein-calorie malnutrition, severe   Acute GI bleeding   Failure to thrive in adult   Rectal bleeding   Delirium due to another medical condition   Neurogenic dysphagia   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

## 2021-08-29 DIAGNOSIS — R627 Adult failure to thrive: Secondary | ICD-10-CM | POA: Diagnosis not present

## 2021-08-29 DIAGNOSIS — R1319 Other dysphagia: Secondary | ICD-10-CM | POA: Diagnosis not present

## 2021-08-29 DIAGNOSIS — N186 End stage renal disease: Secondary | ICD-10-CM | POA: Diagnosis not present

## 2021-08-29 DIAGNOSIS — I6330 Cerebral infarction due to thrombosis of unspecified cerebral artery: Secondary | ICD-10-CM | POA: Diagnosis not present

## 2021-08-29 LAB — GLUCOSE, CAPILLARY
Glucose-Capillary: 155 mg/dL — ABNORMAL HIGH (ref 70–99)
Glucose-Capillary: 174 mg/dL — ABNORMAL HIGH (ref 70–99)
Glucose-Capillary: 235 mg/dL — ABNORMAL HIGH (ref 70–99)
Glucose-Capillary: 215 mg/dL — ABNORMAL HIGH (ref 70–99)

## 2021-08-29 MED ORDER — DARBEPOETIN ALFA 200 MCG/0.4ML IJ SOSY
200.0000 ug | PREFILLED_SYRINGE | INTRAMUSCULAR | Status: DC
  Administered 2021-09-01: 200 ug via INTRAVENOUS
  Filled 2021-08-29: qty 0.4

## 2021-08-29 NOTE — Progress Notes (Signed)
Patient up in the chair for about 2 1/2 hours and tolerated good.  Changed the sacral dressing.

## 2021-08-29 NOTE — Plan of Care (Signed)
  Problem: Clinical Measurements: Goal: Ability to maintain clinical measurements within normal limits will improve Outcome: Progressing   

## 2021-08-29 NOTE — Progress Notes (Signed)
[facility] Progress Note   Subjective:  Completed dialysis yesterday. Seen in room. She's awake. No requests. Says she's ok.    Objective Vitals:   08/28/21 2157 08/28/21 2205 08/29/21 0500 08/29/21 0920  BP: 127/74 (!) 103/56 (!) 115/57 (!) 151/51  Pulse: 100 (!) 55 95 (!) 109  Resp: 16 20 16 20   Temp: 98.6 F (37 C) 98.3 F (36.8 C) 99.3 F (37.4 C) 98.4 F (36.9 C)  TempSrc:      SpO2: 100% 96%  100%  Weight:      Height:       Physical Exam General: Chronically ill appearing female in NAD Heart: S1,S2 RRR No M/R/G Lungs: CTAB No WOB Abdomen: NABS, NT Extremities:No LE edema Dialysis Access: [facility] Drs[facility] intact      Additional Objective Labs: Basic Metabolic Panel: Recent Labs  Lab 08/25/21 1403 08/28/21 0951  NA 139 138  K 4.8 5.4*  CL 100 99  CO2 29 30  GLUCOSE 285* 173*  BUN 21 33*  CREATININE 3.91* 4.28*  CALCIUM 8.0* 8.6*  PHOS 1.7* 1.1*    Liver Function Tests: Recent Labs  Lab 08/25/21 1403 08/28/21 0951  ALBUMIN 1.7* 1.7*    No results for input(s): LIPASE, AMYLASE in the last 168 hours. CBC: Recent Labs  Lab 08/25/21 1403 08/28/21 0951  WBC 19.5* 19.3*  HGB 9.0* 8.5*  HCT 31.0* 28.6*  MCV 90.9 89.7  PLT 346 385    Blood Culture    Component Value Date/Time   SDES BLOOD RIGHT HAND 08/21/2021 0851   SDES BLOOD LEFT HAND 08/21/2021 0851   SPECREQUEST  08/21/2021 0851    BOTTLES DRAWN AEROBIC AND ANAEROBIC Blood Culture adequate volume   SPECREQUEST  08/21/2021 0851    BOTTLES DRAWN AEROBIC AND ANAEROBIC Blood Culture adequate volume   CULT  08/21/2021 0851    NO GROWTH 5 DAYS Performed at [facility], [place] [address], [place], [place] [address]    CULT  08/21/2021 [phone]    NO GROWTH 5 DAYS Performed at [facility], [place] [address] [place], [place] [address]    REPTSTATUS 08/26/2021 FINAL 08/21/2021 0851   REPTSTATUS 08/26/2021 FINAL 08/21/2021 0851    Cardiac Enzymes: No results for  input(s): CKTOTAL, CKMB, CKMBINDEX, TROPONINI in the last 168 hours. CBG: Recent Labs  Lab 08/27/21 2120 08/28/21 0647 08/28/21 1617 08/28/21 2159 08/29/21 0653  GLUCAP 274* 167* 191* 212* 155*    Iron Studies: No results for input(s): IRON, TIBC, TRANSFERRIN, FERRITIN in the last 72 hours. @lablastinr3 @ Studies/Results: No results found. Medications:  methocarbamol (ROBAXIN) IV      apixaban  5 mg Oral BID   Chlorhexidine Gluconate Cloth  6 each Topical Q0600   darbepoetin (ARANESP) injection - DIALYSIS  200 mcg Intravenous Q Fri-HD   feeding supplement  237 mL Oral TID BM   gabapentin  100 mg Oral Q12H   mirtazapine  7.5 mg Oral QHS   OLANZapine zydis  2.5 mg Oral QHS   predniSONE  5 mg Oral Q breakfast   sodium chloride flush  10-40 mL Intracatheter Q12H   venlafaxine  37.5 mg Oral BID     Dialysis Orders: AF MWF  3h 75min 400/500 62.5kg 3K/2.5 Ca P2 AVG HeRO    --No Heparin - Mircera 50 mcg IV q 2 wks  (7/13)   --Venofer 50mg  IV  q wk - Parsabiv 5mg  IV q HD - not on [facility] formulary  Assessment/Plan: AMS/FTT: Mental status waxes/wanes.  Family had decided on comfort measures and has now decided they would like her to continue HD. PT evaluating.  Anemia/Rectal bleeding - GI consulted. Hx rectal ulcers.  EGD 7/27 normal, flex sig with mucosal ulceration c/w sterocoral ulcers, non bleeding. Dropped to 6.5 on 10/10. S/p prbcs on 10/10. Hgb now in the 8-9 range. Continue aranesp q Friday.  Leukocytosis -- RLL HCAP, BC with stap epi. Rec'd Maxipime for one week. Per primary.  ESRD: HD was stopped 08/04/2021. Plan was for comfort measures only. However family rescinded plan. Patient resumed HD 08/14/2021 per family wishes. Cr 2.5-3.2. Last HD 11/3. Start  biweekly HD on Monday and Thursday,  Next HD 09/01/2021. 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 AVG clotted 06/27/21, unable to be declotted d/t arm  contracture. [place] placed 07/02/21.  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  C Ca 9.84 PO4 1.7 08/25/2021. Currently on oral K+Phos.  Nutrition: Alb very low 1.7. Not a candidate for feeding tube. Poor PO intake. Recent CVA with no evidence of meaningful recovery Psych eval from 9/14 determined that patient has functional mental capacity for medical decision-making. Psych re-consulted 9/23 to evaluate the patient again - at this point, patient does not have the capacity to make medical decisions. Ongoing discussions with patient's son continues Dispo: DNR/DNI. Poor prognosis. Family was interested in hospice and patient was on comfort measures, family has now changed their mind and has not been very responsive to palliative care. Patient will need to be strong enough to tolerate dialysis in a recliner before she can be discharged to outpatient. We have been clear that we do not believe hemodialysis is contributing to quality of this woman's life.   Lynnda Child PA-C [facility] 08/29/2021,10:55 AM

## 2021-08-29 NOTE — Progress Notes (Signed)
[name] PROGRESS NOTE  Amy [name] DPO:[id number] DOB: [birth year] DOA: 05/19/2021 PCP: [name], MD                                        10/28 trying to smile during hemodialysis treatment difficult due to positioning in bed   Status:  Remains inpatient appropriate because: Unsafe d/c plan, IV treatments appropriate due to intensity of illness or inability to take PO, and Inpatient level of care appropriate due to severity of illness patient continues to require dialysis as requested by family and until she is able to transition to the standards of outpatient dialysis she must remain in the hospital.  Barriers to discharge:  Patient needs to be able to tolerate transport to an outpatient dialysis center.  She needs to demonstrate tolerance to sitting in recliner for 3-1/2 to 4 hours. Given her refusal to eat institutional food and her preference for home-cooked meals best discharge option would be home with family for 24/7 care if they are able to obtain private pay home health services   Level of care:  Med-Surg  Code Status: DNR Family communication: 11/04 updated son and goddaughter by phone DVT prophylaxis: Eliquis COVID vaccination status: [name] 02/26/2020, [place] 04/10/2021    HPI: 75 y.o. 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 Patient presented to the ED on 7/25 with dark stool.  On presentation, hemoglobin was 9.6; GI was consulted.  She underwent EGD which was normal and flexible sigmoidoscopy which showed nonbleeding distal rectal ulcers/stercoral ulcers.  Subsequently, GI signed off.  Neurology was also consulted for altered mental  status; MRI brain was similar to prior MRI at [facility] with a large right MCA stroke.  EEG this admission unremarkable. PEG tube was desired by family but could not be placed because of overlying transverse colon.  General surgery was consulted for G-tube placement. Patient deemed high risk for open placement of gastrostomy tube.  Subsequently she has suffered another stroke which precludes her from any surgical procedures including open G-tube placement.  This was discussed with her son who was in agreement.  Unfortunately he was more conflicted about stopping dialysis noting prior to the stroke and subsequently since that time each time he is asked his mother regarding stopping dialysis she has told him that she does not want to die and wants to continue dialysis.  Consideration given to transitioning to residential hospice once family in agreement about how to proceed with end-of-life issues.  Patient did have an episode of decline immediately following dialysis on 10/20 but subsequently recovered.  She continues not to eat and intermittently takes medications by mouth.  At this point since patient remains relatively stable despite not eating plan is to determine if she is able to tolerate dialysis in a chair which is a requirement for outpatient hemodialysis.  Once this is accomplished we will need to pursue discharge disposition either to home with family caring for patient or to SNF with family paying out-of-pocket for care.   Subjective: Eyes closed upon my entry.  Awakened without opening eyes but did speak to me.  Denied pain when asked.  Objective: Vitals:   08/28/21 2205 08/29/21 0500  BP: (!) 103/56 (!) 115/57  Pulse: (!) 55 95  Resp: 20 16  Temp: 98.3 F (36.8 C) 99.3 F (37.4 C)  SpO2: 96%     Intake/Output Summary (Last 24 hours) at 08/29/2021 0733 Last data filed at 08/29/2021 0550 Gross per 24 hour  Intake 180 ml  Output 1000 ml  Net -820 ml    Filed Weights   08/28/21 0925  08/28/21 1230  Weight: 90 kg 88.8 kg    Exam:  Constitutional: Remains drowsy but today awakened with mild tactile stimulation and voice Respiratory: Room air, O2 sats 100%, no increased work of breathing, lungs CTA but diminished in the bases Cardiovascular: S1-S2, pulse regular and tachycardic, continues with bilateral UE edema (stable) Abdomen: LBM 10/31, abdomen soft nontender nondistended.  Bowel sounds +, Neurologic: CN 2-12 grossly intact on visual inspection-sensation remains intact, very limited spontaneous movement.  Unable to test strength but appears to be 1/5 x 4 extremities Psychiatric: Awake.  Oriented to person and place  Assessment/Plan: Acute problems: Sepsis physiology/RLL HCAP/Abn blood cxs c/w contaminant Completed 7 days of Maxipime  Failure to thrive with generalized deconditioning in context of neurogenic dysphagia Given acute stroke patient is no longer a candidate for open G-tube placement.  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-recurrent acute on subacute stroke Recent MRI 10/10 revealed right posterior frontal, posterior temporal, parietal and occipital acute versus subacute infarct Continue Eliquis   LUE and RUE edema 2/2 superficial thrombophlebitis LUE edema chronic in nature Venous duplex neg for DVT but does demonstrate superficial thrombophlebitis Continue full dose anticoagulation   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   Given recent and recurrent stroke she is not a candidate for operative placement/open G-tube  Suspected dementia/recent delirium Continue Remeron and Effexor  ESRD on HD Continues to tolerate dialysis with intermittent episodes of tachycardia likely related to dehydration from poor oral intake-status postdialysis on 11/3 net UF 1 L Continue therapy and nursing interventions to promote ability to sit up in chair so can tolerate outpatient hemodialysis Nephrology has changed her to a Monday Thursday schedule.  They have also documented that in their opinion dialysis is not contributing to this patient's quality of life.  Neck pain Neck pain continues and she is also noted to have more diffuse pain but developed hypotension and hypoxemia with narcotic pain medications Quite somnolent today so we will change Robaxin back to IV and add as needed Toradol  Type 2 diabetes mellitus Episodes of hypoglycemia Hemoglobin A1c 5.1.  Follow CBGs and provide SSI as indicated Not eating enough for long-acting insulin Continue Neurontin for peripheral neuropathy  Stage II mid coccyx pressure injury, present on admission CT pelvis 10/10 without evidence of abscess Continue local wound care-given poor nutrition and is doubtful this wound will ever heal and likely will only get worse due to her concomitant lack of mobility        Data Reviewed:  CBC: Recent Labs  Lab 08/25/21 1403 08/28/21 0951  WBC 19.5*  19.3*  HGB 9.0* 8.5*  HCT 31.0* 28.6*  MCV 90.9 89.7  PLT 346 385     CBG: Recent Labs  Lab 08/27/21 2120 08/28/21 0647 08/28/21 1617 08/28/21 2159 08/29/21 0653  GLUCAP 274* 167* 191* 212* 155*     Scheduled Meds:  apixaban  5 mg Oral BID   Chlorhexidine Gluconate Cloth  6 each Topical Q0600   darbepoetin (ARANESP) injection - DIALYSIS  200 mcg Intravenous Q Fri-HD   feeding supplement  237 mL Oral TID BM   gabapentin  100 mg Oral Q12H   mirtazapine  7.5 mg Oral QHS    OLANZapine zydis  2.5 mg Oral QHS   predniSONE  5 mg Oral Q breakfast   sodium chloride flush  10-40 mL Intracatheter Q12H   venlafaxine  37.5 mg Oral BID     Active Problems:   Essential hypertension   Type 2 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, with long-term current use of insulin (HCC)   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   ESRD on dialysis ([place])   Dyslipidemia   Stage 2 skin ulcer of sacral region ([place])   Cerebral thrombosis with cerebral infarction   Protein-calorie malnutrition, severe   Acute GI bleeding   Failure to thrive in adult   Rectal bleeding   Delirium due to another medical condition   Neurogenic dysphagia   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 ([place])   HCAP (healthcare-associated pneumonia)   Sepsis due to other etiology [facility])   Recurrent cerebrovascular accidents (CVAs) ([place])   End of life care   Consultants: Vascular surgery Nephrology Gastroenterology Neurology Palliative medicine General surgery  Procedures: EEG Echocardiogram [place] EGD  Antibiotics: Ceftriaxone 8/3 through 8/5   Time spent: 15 minutes    Erin Hearing ANP  [facility] 7 am - 330 pm/M-F for direct patient care and secure chat Please refer to Amion for contact info 101  days

## 2021-08-30 DIAGNOSIS — N186 End stage renal disease: Secondary | ICD-10-CM | POA: Diagnosis not present

## 2021-08-30 LAB — GLUCOSE, CAPILLARY
Glucose-Capillary: 183 mg/dL — ABNORMAL HIGH (ref 70–99)
Glucose-Capillary: 204 mg/dL — ABNORMAL HIGH (ref 70–99)
Glucose-Capillary: 233 mg/dL — ABNORMAL HIGH (ref 70–99)
Glucose-Capillary: 230 mg/dL — ABNORMAL HIGH (ref 70–99)

## 2021-08-30 NOTE — Progress Notes (Signed)
PROGRESS NOTE    Amy [name]  NLG:[id number] DOB: 1946-03-28 DOA: 05/19/2021 PCP: [name], MD    Chief Complaint  Patient presents with   Rectal Bleeding    Brief Narrative:   75 y.o. 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 Patient presented to the ED on 7/25 with dark stool.  On presentation, hemoglobin was 9.6; GI was consulted.  She underwent EGD which was normal and flexible sigmoidoscopy which showed nonbleeding distal rectal ulcers/stercoral ulcers.  Subsequently, GI signed off.   Neurology was also consulted for altered mental status; MRI brain was similar to prior MRI at [facility] with a large right MCA stroke.  EEG this admission unremarkable. PEG tube was desired by family but could not be placed because of overlying transverse colon.  General surgery was consulted for G-tube placement. Patient deemed high risk for open placement of gastrostomy tube.   Subsequently she has suffered another stroke which precludes her from any surgical procedures including open G-tube placement.  This was discussed with her son who was in agreement.  Unfortunately he was more conflicted about stopping dialysis noting prior to the stroke and subsequently since that time each time he is asked his mother regarding stopping dialysis she has told him that she does not want to die and wants to continue dialysis.  Consideration given to transitioning to residential hospice once family in agreement about how to proceed with end-of-life issues.  Patient did have an episode of decline immediately following dialysis on 10/20 but subsequently recovered.  She continues not to eat and  intermittently takes medications by mouth.  At this point since patient remains relatively stable despite not eating plan is to determine if she is able to tolerate dialysis in a chair which is a requirement for outpatient hemodialysis.  Once this is accomplished we will need to pursue discharge disposition either to home with family caring for patient or to SNF with family paying out-of-pocket for care.    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-recurrent acute on subacute stroke Recent MRI 10/10 revealed right posterior frontal,  posterior temporal, parietal and occipital acute versus subacute infarct Continue Eliquis    LUE and RUE edema 2/2 superficial thrombophlebitis LUE edema chronic in nature Venous duplex neg for DVT but does demonstrate superficial thrombophlebitis Continue full dose anticoagulation    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  Given recent and recurrent stroke she is not a candidate for operative placement/open G-tube   Suspected dementia/recent delirium Continue Remeron and Effexor   ESRD on HD Continues to tolerate dialysis with intermittent episodes of tachycardia likely related to dehydration from poor oral intake-status postdialysis on 11/3 net UF 1 L Continue therapy and nursing interventions to promote ability to sit up in chair so can tolerate outpatient hemodialysis Nephrology has changed her to a Monday Thursday schedule.  They have also documented that in their opinion dialysis is not contributing to this patient's quality of life.   Neck pain Neck pain continues and she is also noted to have more diffuse pain but developed hypotension and hypoxemia with narcotic pain medications Quite somnolent today so we will change Robaxin back to IV and add as needed Toradol   Type 2 diabetes mellitus Episodes of hypoglycemia Hemoglobin A1c 5.1.  Follow CBGs and provide SSI as indicated Not eating enough for long-acting insulin Continue Neurontin for peripheral neuropathy   Stage II mid coccyx pressure injury, present on admission CT pelvis 10/10 without evidence of abscess Continue local wound care-given poor nutrition and is doubtful this wound will ever heal and likely will only get worse due to her concomitant lack of mobility            DVT prophylaxis: Eliquis Code Status:  Full Family Communication: None at eb[place]ide Disposition:   Status is: Inpatient        Subjective:  She is herself unable to provide any complaints, no significant events as discussed with staff.  Objective: Vitals:   08/29/21 2130 08/29/21 2300 08/30/21 0411 08/30/21 0951  BP: 119/70  (!) 156/90 (!) 142/85  Pulse: 96  (!) 116 100  Resp: 16  20   Temp: 98.3 F (36.8 C)  98 F (36.7 C) 98.6 F (37 C)  TempSrc:   Oral Axillary  SpO2: 100%  99% 98%  Weight:  88 kg    Height:        Intake/Output Summary (Last 24 hours) at 08/30/2021 1342 Last data filed at 08/30/2021 0825 Gross per 24 hour  Intake 357 ml  Output 0 ml  Net 357 ml   Filed Weights   08/28/21 0925 08/28/21 1230 08/29/21 2300  Weight: 90 kg 88.8 kg 88 kg    Examination:  Awake Alert, oriented x1, pleasant, chronically ill-appearing, no apparent distress Symmetrical Chest wall movement, Good air movement bilaterally, CTAB RRR,No Gallops,Rubs or new Murmurs, No Parasternal Heave +ve B.Sounds, Abd Soft, No tenderness, No rebound - guarding or rigidity. No Cyanosis, Clubbing or edema, No new Rash or bruise      Data Reviewed: I have personally reviewed following labs and imaging studies  CBC: Recent Labs  Lab 08/25/21 1403 08/28/21 0951  WBC 19.5* 19.3*  HGB 9.0* 8.5*  HCT 31.0* 28.6*  MCV 90.9  89.7  PLT 346 952    Basic Metabolic Panel: Recent Labs  Lab 08/25/21 1403 08/28/21 0951  NA 139 138  K 4.8 5.4*  CL 100 99  CO2 29 30  GLUCOSE 285* 173*  BUN 21 33*  CREATININE 3.91* 4.28*  CALCIUM 8.0* 8.6*  PHOS 1.7* 1.1*    GFR: Estimated Creatinine Clearance: 13.3 mL/min (A) (by C-G formula based on SCr of 4.28 mg/dL (H)).  Liver Function Tests: Recent Labs  Lab 08/25/21 1403 08/28/21 0951  ALBUMIN 1.7* 1.7*    CBG: Recent Labs  Lab 08/29/21 1147 08/29/21 1609 08/29/21 2132 08/30/21 0638 08/30/21 1132  GLUCAP 174* 235* 215* 183* 204*     Recent Results (from the  past 240 hour(s))  Culture, blood (routine x 2)     Status: None   Collection Time: 08/21/21  8:51 AM   Specimen: BLOOD RIGHT HAND  Result Value Ref Range Status   Specimen Description BLOOD RIGHT HAND  Final   Special Requests   Final    BOTTLES DRAWN AEROBIC AND ANAEROBIC Blood Culture adequate volume   Culture   Final    NO GROWTH 5 DAYS Performed at [facility], [place] [address] [place], [place] [address]    Report Status 08/26/2021 FINAL  Final  Culture, blood (routine x 2)     Status: None   Collection Time: 08/21/21  8:51 AM   Specimen: BLOOD LEFT HAND  Result Value Ref Range Status   Specimen Description BLOOD LEFT HAND  Final   Special Requests   Final    BOTTLES DRAWN AEROBIC AND ANAEROBIC Blood Culture adequate volume   Culture   Final    NO GROWTH 5 DAYS Performed at [facility], [place] [address], [place], [place] [address]    Report Status 08/26/2021 FINAL  Final         Radiology Studies: No results found.      Scheduled Meds:  apixaban  5 mg Oral BID   Chlorhexidine Gluconate Cloth  6 each Topical Q0600   [START ON 09/01/2021] darbepoetin (ARANESP) injection - DIALYSIS  200 mcg Intravenous Q Mon-HD   feeding supplement  237 mL Oral TID BM   gabapentin  100 mg Oral Q12H   mirtazapine  7.5 mg Oral QHS   OLANZapine zydis  2.5 mg Oral QHS   predniSONE  5 mg Oral Q breakfast   sodium chloride flush  10-40 mL Intracatheter Q12H   venlafaxine  37.5 mg Oral BID   Continuous Infusions:  methocarbamol (ROBAXIN) IV       LOS: 102 days      Phillips Climes, MD [facility]   To contact the attending provider between 7A-7P or the covering provider during after hours 7P-7A, please log into the web site [email or web address] and access using universal [place] password for that web site. If you do not have the password, please call the hospital operator.  08/30/2021, 1:42 PM

## 2021-08-30 NOTE — Progress Notes (Signed)
[facility] Progress Note   Subjective:  Seen in room. Sleeping, but wakes briefly, nad.    Objective Vitals:   08/29/21 1735 08/29/21 2130 08/29/21 2300 08/30/21 0411  BP: (!) 158/63 119/70  (!) 156/90  Pulse: 92 96  (!) 116  Resp: 17 16  20   Temp: 97.8 F (36.6 C) 98.3 F (36.8 C)  98 F (36.7 C)  TempSrc:    Oral  SpO2: 100% 100%  99%  Weight:   88 kg   Height:       Physical Exam General: Chronically ill appearing female in NAD Heart: S1,S2 RRR No M/R/G Lungs: CTAB No WOB Abdomen: NABS, NT Extremities:No LE edema Dialysis Access: LIJ [facility] Drsg intact    Additional Objective Labs: Basic Metabolic Panel: Recent Labs  Lab 08/25/21 1403 08/28/21 0951  NA 139 138  K 4.8 5.4*  CL 100 99  CO2 29 30  GLUCOSE 285* 173*  BUN 21 33*  CREATININE 3.91* 4.28*  CALCIUM 8.0* 8.6*  PHOS 1.7* 1.1*    Liver Function Tests: Recent Labs  Lab 08/25/21 1403 08/28/21 0951  ALBUMIN 1.7* 1.7*    No results for input(s): LIPASE, AMYLASE in the last 168 hours. CBC: Recent Labs  Lab 08/25/21 1403 08/28/21 0951  WBC 19.5* 19.3*  HGB 9.0* 8.5*  HCT 31.0* 28.6*  MCV 90.9 89.7  PLT 346 385    Blood Culture    Component Value Date/Time   SDES BLOOD RIGHT HAND 08/21/2021 0851   SDES BLOOD LEFT HAND 08/21/2021 0851   SPECREQUEST  08/21/2021 0851    BOTTLES DRAWN AEROBIC AND ANAEROBIC Blood Culture adequate volume   SPECREQUEST  08/21/2021 0851    BOTTLES DRAWN AEROBIC AND ANAEROBIC Blood Culture adequate volume   CULT  08/21/2021 0851    NO GROWTH 5 DAYS Performed at [facility], [place] [address], [place], [place] [address]    CULT  08/21/2021 [phone]    NO GROWTH 5 DAYS Performed at [facility], [place] [address], [place], [place] [address]    REPTSTATUS 08/26/2021 FINAL 08/21/2021 0851   REPTSTATUS 08/26/2021 FINAL 08/21/2021 0851    Cardiac Enzymes: No results for input(s): CKTOTAL, CKMB, CKMBINDEX, TROPONINI in the last 168  hours. CBG: Recent Labs  Lab 08/29/21 0653 08/29/21 1147 08/29/21 1609 08/29/21 2132 08/30/21 0638  GLUCAP 155* 174* 235* 215* 183*    Iron Studies: No results for input(s): IRON, TIBC, TRANSFERRIN, FERRITIN in the last 72 hours. @lablastinr3 @ Studies/Results: No results found. Medications:  methocarbamol (ROBAXIN) IV      apixaban  5 mg Oral BID   Chlorhexidine Gluconate Cloth  6 each Topical Q0600   [START ON 09/01/2021] darbepoetin (ARANESP) injection - DIALYSIS  200 mcg Intravenous Q Mon-HD   feeding supplement  237 mL Oral TID BM   gabapentin  100 mg Oral Q12H   mirtazapine  7.5 mg Oral QHS   OLANZapine zydis  2.5 mg Oral QHS   predniSONE  5 mg Oral Q breakfast   sodium chloride flush  10-40 mL Intracatheter Q12H   venlafaxine  37.5 mg Oral BID     Dialysis Orders: AF MWF  3h 98min 400/500 62.5kg 3K/2.5 Ca P2 AVG HeRO    --No Heparin - Mircera 50 mcg IV q 2 wks  (7/13)   --Venofer 50mg  IV  q wk - Parsabiv 5mg  IV q HD - not on [facility] formulary   Assessment/Plan: AMS/FTT: Mental status waxes/wanes.  Family had  decided on comfort measures and has now decided they would like her to continue HD. PT evaluating.  Anemia/Rectal bleeding - GI consulted. Hx rectal ulcers.  EGD 7/27 normal, flex sig with mucosal ulceration c/w sterocoral ulcers, non bleeding. Dropped to 6.5 on 10/10. S/p prbcs on 10/10. Hgb now in the 8-9 range. Continue aranesp q Friday.  Leukocytosis -- RLL HCAP, BC with stap epi. Rec'd Maxipime for one week. Per primary.  ESRD: HD was stopped 08/04/2021. Plan was for comfort measures only. However family rescinded plan. Patient resumed HD 08/14/2021 per family wishes. Cr 2.5-3.2. Last HD 11/3. Start  biweekly HD on Monday and Thursday,  Next HD 09/01/2021. 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 AVG clotted 06/27/21, unable to be declotted d/t arm contracture. [place] placed 07/02/21.   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  C Ca 9.84 PO4 1.7 08/25/2021. Currently on oral K+Phos.  Nutrition: Alb very low 1.7. Not a candidate for feeding tube. Poor PO intake. Recent CVA with no evidence of meaningful recovery Psych eval from 9/14 determined that patient has functional mental capacity for medical decision-making. Psych re-consulted 9/23 to evaluate the patient again - at this point, patient does not have the capacity to make medical decisions. Ongoing discussions with patient's son continues Dispo: DNR/DNI. Poor prognosis. Family was interested in hospice and patient was on comfort measures, family has now changed their mind and has not been very responsive to palliative care. Patient will need to be strong enough to tolerate dialysis in a recliner before she can be discharged to outpatient. We have been clear that we do not believe hemodialysis is contributing to quality of this woman's life.   Lynnda Child PA-C [facility] 08/30/2021,9:50 AM

## 2021-08-30 NOTE — Plan of Care (Signed)
  Problem: Health Behavior/Discharge Planning: Goal: Ability to manage health-related needs will improve Outcome: Not Progressing   

## 2021-08-31 DIAGNOSIS — N186 End stage renal disease: Secondary | ICD-10-CM | POA: Diagnosis not present

## 2021-08-31 LAB — GLUCOSE, CAPILLARY
Glucose-Capillary: 132 mg/dL — ABNORMAL HIGH (ref 70–99)
Glucose-Capillary: 135 mg/dL — ABNORMAL HIGH (ref 70–99)
Glucose-Capillary: 174 mg/dL — ABNORMAL HIGH (ref 70–99)

## 2021-08-31 NOTE — Progress Notes (Signed)
[facility] Progress Note   Subjective:  Seen in room. She's awake, asks for water to drink.  No new complaints.    Objective Vitals:   08/30/21 1800 08/30/21 2137 08/31/21 0557 08/31/21 0923  BP: 138/78 139/79 139/72 128/68  Pulse:  95 (!) 110 (!) 101  Resp:  18 18 16   Temp: 98 F (36.7 C) 98.6 F (37 C) 98 F (36.7 C) 98.2 F (36.8 C)  TempSrc:  Oral  Oral  SpO2: 98% 100% 100% 100%  Weight:      Height:       Physical Exam General: Chronically ill appearing female in NAD Heart: S1,S2 RRR No M/R/G Lungs: CTAB No WOB Abdomen: NABS, NT Extremities:No LE edema Dialysis Access: [facility] Drsg intact    Additional Objective Labs: Basic Metabolic Panel: Recent Labs  Lab 08/25/21 1403 08/28/21 0951  NA 139 138  K 4.8 5.4*  CL 100 99  CO2 29 30  GLUCOSE 285* 173*  BUN 21 33*  CREATININE 3.91* 4.28*  CALCIUM 8.0* 8.6*  PHOS 1.7* 1.1*    Liver Function Tests: Recent Labs  Lab 08/25/21 1403 08/28/21 0951  ALBUMIN 1.7* 1.7*    No results for input(s): LIPASE, AMYLASE in the last 168 hours. CBC: Recent Labs  Lab 08/25/21 1403 08/28/21 0951  WBC 19.5* 19.3*  HGB 9.0* 8.5*  HCT 31.0* 28.6*  MCV 90.9 89.7  PLT 346 385    Blood Culture    Component Value Date/Time   SDES BLOOD RIGHT HAND 08/21/2021 0851   SDES BLOOD LEFT HAND 08/21/2021 0851   SPECREQUEST  08/21/2021 0851    BOTTLES DRAWN AEROBIC AND ANAEROBIC Blood Culture adequate volume   SPECREQUEST  08/21/2021 0851    BOTTLES DRAWN AEROBIC AND ANAEROBIC Blood Culture adequate volume   CULT  08/21/2021 0851    NO GROWTH 5 DAYS Performed at [facility], [place] [address] [place], [place] [address]    CULT  08/21/2021 [phone]    NO GROWTH 5 DAYS Performed at [facility], [place] [address] [place], [place] [address]    REPTSTATUS 08/26/2021 FINAL 08/21/2021 0851   REPTSTATUS 08/26/2021 FINAL 08/21/2021 0851    Cardiac Enzymes: No results for input(s): CKTOTAL, CKMB,  CKMBINDEX, TROPONINI in the last 168 hours. CBG: Recent Labs  Lab 08/30/21 0638 08/30/21 1132 08/30/21 1638 08/30/21 2130 08/31/21 0600  GLUCAP 183* 204* 233* 230* 132*    Iron Studies: No results for input(s): IRON, TIBC, TRANSFERRIN, FERRITIN in the last 72 hours. @lablastinr3 @ Studies/Results: No results found. Medications:  methocarbamol (ROBAXIN) IV      apixaban  5 mg Oral BID   Chlorhexidine Gluconate Cloth  6 each Topical Q0600   [START ON 09/01/2021] darbepoetin (ARANESP) injection - DIALYSIS  200 mcg Intravenous Q Mon-HD   feeding supplement  237 mL Oral TID BM   gabapentin  100 mg Oral Q12H   mirtazapine  7.5 mg Oral QHS   OLANZapine zydis  2.5 mg Oral QHS   predniSONE  5 mg Oral Q breakfast   sodium chloride flush  10-40 mL Intracatheter Q12H   venlafaxine  37.5 mg Oral BID     Dialysis Orders: AF MWF  3h 49min 400/500 62.5kg 3K/2.5 Ca P2 AVG HeRO    --No Heparin - Mircera 50 mcg IV q 2 wks  (7/13)   --Venofer 50mg  IV  q wk - Parsabiv 5mg  IV q HD - not on [facility] formulary   Assessment/Plan:  AMS/FTT: Mental status waxes/wanes.  Family had decided on comfort measures and has now decided they would like her to continue HD. PT evaluating.  Anemia/Rectal bleeding - GI consulted. Hx rectal ulcers.  EGD 7/27 normal, flex sig with mucosal ulceration c/w sterocoral ulcers, non bleeding. Dropped to 6.5 on 10/10. S/p prbcs on 10/10. Hgb now in the 8-9 range. Continue aranesp q Friday.  Leukocytosis -- RLL HCAP, BC with stap epi. Rec'd Maxipime for one week. Per primary.  ESRD: HD was stopped 08/04/2021. Plan was for comfort measures only. However family rescinded plan. Patient resumed HD 08/14/2021 per family wishes.  Last HD 11/3. Start  biweekly HD on Monday and Thursday,  Next HD 09/01/2021. 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 AVG clotted 06/27/21, unable to be declotted d/t arm contracture. [place] placed  07/02/21.  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  C Ca 9.84 PO4 1.7 08/25/2021. Currently on oral K+Phos.  Nutrition: Alb very low 1.7. Not a candidate for feeding tube. Poor PO intake. Recent CVA with no evidence of meaningful recovery Psych eval from 9/14 determined that patient has functional mental capacity for medical decision-making. Psych re-consulted 9/23 to evaluate the patient again - at this point, patient does not have the capacity to make medical decisions. Ongoing discussions with patient's son continues Dispo: DNR/DNI. Poor prognosis. Family was interested in hospice and patient was on comfort measures, family has now changed their mind and has not been very responsive to palliative care. Patient will need to be strong enough to tolerate dialysis in a recliner before she can be discharged to outpatient. We have been clear that we do not believe hemodialysis is contributing to quality of this woman's life.   Lynnda Child PA-C [facility] 08/31/2021,10:38 AM

## 2021-08-31 NOTE — Progress Notes (Signed)
PROGRESS NOTE    Amy [name]  HYW:[id number] DOB: 1946-06-27 DOA: 05/19/2021 PCP: [name], MD    Chief Complaint  Patient presents with   Rectal Bleeding    Brief Narrative:   75 y.o. 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 Patient presented to the ED on 7/25 with dark stool.  On presentation, hemoglobin was 9.6; GI was consulted.  She underwent EGD which was normal and flexible sigmoidoscopy which showed nonbleeding distal rectal ulcers/stercoral ulcers.  Subsequently, GI signed off.   Neurology was also consulted for altered mental status; MRI brain was similar to prior MRI at [facility] with a large right MCA stroke.  EEG this admission unremarkable. PEG tube was desired by family but could not be placed because of overlying transverse colon.  General surgery was consulted for G-tube placement. Patient deemed high risk for open placement of gastrostomy tube.   Subsequently she has suffered another stroke which precludes her from any surgical procedures including open G-tube placement.  This was discussed with her son who was in agreement.  Unfortunately he was more conflicted about stopping dialysis noting prior to the stroke and subsequently since that time each time he is asked his mother regarding stopping dialysis she has told him that she does not want to die and wants to continue dialysis.  Consideration given to transitioning to residential hospice once family in agreement about how to proceed with end-of-life issues.  Patient did have an episode of decline immediately following dialysis on 10/20 but subsequently recovered.  She continues not to eat and  intermittently takes medications by mouth.  At this point since patient remains relatively stable despite not eating plan is to determine if she is able to tolerate dialysis in a chair which is a requirement for outpatient hemodialysis.  Once this is accomplished we will need to pursue discharge disposition either to home with family caring for patient or to SNF with family paying out-of-pocket for care.    Assessment & Plan:   Active Problems:   Essential hypertension   Type 2 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, with long-term current use of insulin (HCC)   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   ESRD on dialysis ([place])   Dyslipidemia   Stage 2 skin ulcer of sacral region [facility])   Cerebral thrombosis with cerebral infarction   Protein-calorie malnutrition, severe   Acute GI bleeding   Failure to thrive in adult   Rectal bleeding   Delirium due to another medical condition   Neurogenic dysphagia   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 ([place])   HCAP (healthcare-associated pneumonia)   Sepsis due to other etiology ([place])   Recurrent cerebrovascular accidents (CVAs) ([place])   End of life care  Sepsis physiology/RLL HCAP/Abn blood cxs c/w contaminant Completed 7 days of Maxipime   Failure to thrive with generalized deconditioning in context of neurogenic dysphagia Given acute stroke patient is no longer a candidate for open G-tube placement.  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-recurrent acute on subacute stroke Recent MRI 10/10 revealed right posterior frontal,  posterior temporal, parietal and occipital acute versus subacute infarct Continue Eliquis    LUE and RUE edema 2/2 superficial thrombophlebitis LUE edema chronic in nature Venous duplex neg for DVT but does demonstrate superficial thrombophlebitis Continue full dose anticoagulation    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  Given recent and recurrent stroke she is not a candidate for operative placement/open G-tube   Suspected dementia/recent delirium Continue Remeron and Effexor   ESRD on HD Continues to tolerate dialysis with intermittent episodes of tachycardia likely related to dehydration from poor oral intake-status postdialysis on 11/3 net UF 1 L Continue therapy and nursing interventions to promote ability to sit up in chair so can tolerate outpatient hemodialysis Nephrology has changed her to a Monday Thursday schedule.  They have also documented that in their opinion dialysis is not contributing to this patient's quality of life.   Neck pain Neck pain continues and she is also noted to have more diffuse pain but developed hypotension and hypoxemia with narcotic pain medications Quite somnolent today so we will change Robaxin back to IV and add as needed Toradol   Type 2 diabetes mellitus Episodes of hypoglycemia Hemoglobin A1c 5.1.  Follow CBGs and provide SSI as indicated Not eating enough for long-acting insulin Continue Neurontin for peripheral neuropathy   Stage II mid coccyx pressure injury, present on admission CT pelvis 10/10 without evidence of abscess Continue local wound care-given poor nutrition and is doubtful this wound will ever heal and likely will only get worse due to her concomitant lack of mobility            DVT prophylaxis: Eliquis Code Status:  Full Family Communication: None at ebdside Disposition:   Status is: Inpatient        Subjective:  Significant events overnight as discussed with staff, she denies any complaints today  Objective: Vitals:   08/30/21 1800 08/30/21 2137 08/31/21 0557 08/31/21 0923  BP: 138/78 139/79 139/72 128/68  Pulse:  95 (!) 110 (!) 101  Resp:  18 18 16   Temp: 98 F (36.7 C) 98.6 F (37 C) 98 F (36.7 C) 98.2 F (36.8 C)  TempSrc:  Oral  Oral  SpO2: 98% 100% 100% 100%  Weight:      Height:        Intake/Output Summary (Last 24 hours) at 08/31/2021 1307 Last data filed at 08/31/2021 0905 Gross per 24 hour  Intake 200 ml  Output 0 ml  Net 200 ml   Filed Weights   08/28/21 0925 08/28/21 1230 08/29/21 2300  Weight: 90 kg 88.8 kg 88 kg    Examination:  Awake Alert, oriented x2, pleasant, chronically ill-appearing, no apparent distress,she is more  conversant and appropriate today Symmetrical Chest wall movement, Good air movement bilaterally, CTAB RRR,No Gallops,Rubs or new Murmurs, No Parasternal Heave +ve B.Sounds, Abd Soft, No tenderness, No rebound - guarding or rigidity. No Cyanosis, Clubbing or edema, No new Rash or bruise      Data Reviewed: I have personally reviewed following labs and imaging studies  CBC: Recent Labs  Lab 08/25/21 1403 08/28/21 0951  WBC 19.5* 19.3*  HGB 9.0* 8.5*  HCT  31.0* 28.6*  MCV 90.9 89.7  PLT 346 032    Basic Metabolic Panel: Recent Labs  Lab 08/25/21 1403 08/28/21 0951  NA 139 138  K 4.8 5.4*  CL 100 99  CO2 29 30  GLUCOSE 285* 173*  BUN 21 33*  CREATININE 3.91* 4.28*  CALCIUM 8.0* 8.6*  PHOS 1.7* 1.1*    GFR: Estimated Creatinine Clearance: 13.3 mL/min (A) (by C-G formula based on SCr of 4.28 mg/dL (H)).  Liver Function Tests: Recent Labs  Lab 08/25/21 1403 08/28/21 0951  ALBUMIN 1.7* 1.7*    CBG: Recent Labs  Lab 08/30/21 1132 08/30/21 1638 08/30/21 2130 08/31/21 0600 08/31/21 1130  GLUCAP 204*  233* 230* 132* 135*     No results found for this or any previous visit (from the past 240 hour(s)).        Radiology Studies: No results found.      Scheduled Meds:  apixaban  5 mg Oral BID   Chlorhexidine Gluconate Cloth  6 each Topical Q0600   [START ON 09/01/2021] darbepoetin (ARANESP) injection - DIALYSIS  200 mcg Intravenous Q Mon-HD   feeding supplement  237 mL Oral TID BM   gabapentin  100 mg Oral Q12H   mirtazapine  7.5 mg Oral QHS   OLANZapine zydis  2.5 mg Oral QHS   predniSONE  5 mg Oral Q breakfast   sodium chloride flush  10-40 mL Intracatheter Q12H   venlafaxine  37.5 mg Oral BID   Continuous Infusions:  methocarbamol (ROBAXIN) IV       LOS: 103 days      Phillips Climes, MD [facility]   To contact the attending provider between 7A-7P or the covering provider during after hours 7P-7A, please log into the web site [email or web address] and access using universal [place] password for that web site. If you do not have the password, please call the hospital operator.  08/31/2021, 1:07 PM

## 2021-08-31 NOTE — Plan of Care (Signed)
?  Problem: Clinical Measurements: ?Goal: Ability to maintain clinical measurements within normal limits will improve ?Outcome: Not Progressing ?  ?

## 2021-09-01 DIAGNOSIS — Z992 Dependence on renal dialysis: Secondary | ICD-10-CM | POA: Diagnosis not present

## 2021-09-01 DIAGNOSIS — N186 End stage renal disease: Secondary | ICD-10-CM | POA: Diagnosis not present

## 2021-09-01 LAB — GLUCOSE, CAPILLARY
Glucose-Capillary: 101 mg/dL — ABNORMAL HIGH (ref 70–99)
Glucose-Capillary: 220 mg/dL — ABNORMAL HIGH (ref 70–99)
Glucose-Capillary: 196 mg/dL — ABNORMAL HIGH (ref 70–99)

## 2021-09-01 LAB — RENAL FUNCTION PANEL
Albumin: 1.6 g/dL — ABNORMAL LOW (ref 3.5–5.0)
Anion gap: 9 (ref 5–15)
BUN: 42 mg/dL — ABNORMAL HIGH (ref 8–23)
CO2: 29 mmol/L (ref 22–32)
Calcium: 8.4 mg/dL — ABNORMAL LOW (ref 8.9–10.3)
Chloride: 100 mmol/L (ref 98–111)
Creatinine, Ser: 5.12 mg/dL — ABNORMAL HIGH (ref 0.44–1.00)
GFR, Estimated: 8 mL/min — ABNORMAL LOW (ref >60)
Glucose, Bld: 135 mg/dL — ABNORMAL HIGH (ref 70–99)
Phosphorus: 1.9 mg/dL — ABNORMAL LOW (ref 2.5–4.6)
Potassium: 5.1 mmol/L (ref 3.5–5.1)
Sodium: 138 mmol/L (ref 135–145)

## 2021-09-01 LAB — CBC
HCT: 26.6 % — ABNORMAL LOW (ref 36.0–46.0)
Hemoglobin: 8 g/dL — ABNORMAL LOW (ref 12.0–15.0)
MCH: 26.1 pg (ref 26.0–34.0)
MCHC: 30.1 g/dL (ref 30.0–36.0)
MCV: 86.6 fL (ref 80.0–100.0)
Platelets: 413 10*3/uL — ABNORMAL HIGH (ref 150–400)
RBC: 3.07 MIL/uL — ABNORMAL LOW (ref 3.87–5.11)
RDW: 19 % — ABNORMAL HIGH (ref 11.5–15.5)
WBC: 7.6 10*3/uL (ref 4.0–10.5)
nRBC: 0 % (ref 0.0–0.2)

## 2021-09-01 MED ORDER — ALTEPLASE 2 MG IJ SOLR: 2.0000 mg | Freq: Once | INTRAMUSCULAR | Status: DC | PRN

## 2021-09-01 MED ORDER — LIDOCAINE HCL (PF) 1 % IJ SOLN: 5.0000 mL | INTRAMUSCULAR | Status: DC | PRN

## 2021-09-01 MED ORDER — SODIUM CHLORIDE 0.9 % IV SOLN: 100.0000 mL | INTRAVENOUS | Status: DC | PRN

## 2021-09-01 MED ORDER — HEPARIN SODIUM (PORCINE) 1000 UNIT/ML DIALYSIS: 1000.0000 [IU] | INTRAMUSCULAR | Status: DC | PRN

## 2021-09-01 MED ORDER — PENTAFLUOROPROP-TETRAFLUOROETH EX AERO: 1.0000 "application " | INHALATION_SPRAY | CUTANEOUS | Status: DC | PRN

## 2021-09-01 MED ORDER — LIDOCAINE-PRILOCAINE 2.5-2.5 % EX CREA: 1.0000 "application " | TOPICAL_CREAM | CUTANEOUS | Status: DC | PRN

## 2021-09-01 NOTE — Progress Notes (Signed)
Bp rechecked.  

## 2021-09-01 NOTE — Progress Notes (Signed)
BFR decreased due to high arterial pressure alarms. Lines reversed.

## 2021-09-01 NOTE — Progress Notes (Signed)
UF turned back on. Bp improved.

## 2021-09-01 NOTE — Progress Notes (Signed)
PROGRESS NOTE    Amy [name]  DQQ:[id number] DOB: October 03, 1946 DOA: 05/19/2021 PCP: [name], MD    Chief Complaint  Patient presents with   Rectal Bleeding    Brief Narrative:   75 y.o. 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 Patient presented to the ED on 7/25 with dark stool.  On presentation, hemoglobin was 9.6; GI was consulted.  She underwent EGD which was normal and flexible sigmoidoscopy which showed nonbleeding distal rectal ulcers/stercoral ulcers.  Subsequently, GI signed off.   Neurology was also consulted for altered mental status; MRI brain was similar to prior MRI at [facility] with a large right MCA stroke.  EEG this admission unremarkable. PEG tube was desired by family but could not be placed because of overlying transverse colon.  General surgery was consulted for G-tube placement. Patient deemed high risk for open placement of gastrostomy tube.   Subsequently she has suffered another stroke which precludes her from any surgical procedures including open G-tube placement.  This was discussed with her son who was in agreement.  Unfortunately he was more conflicted about stopping dialysis noting prior to the stroke and subsequently since that time each time he is asked his mother regarding stopping dialysis she has told him that she does not want to die and wants to continue dialysis.  Consideration given to transitioning to residential hospice once family in agreement about how to proceed with end-of-life issues.  Patient did have an episode of decline immediately following dialysis on 10/20 but subsequently recovered.  She continues not to eat and  intermittently takes medications by mouth.  At this point since patient remains relatively stable despite not eating plan is to determine if she is able to tolerate dialysis in a chair which is a requirement for outpatient hemodialysis.  Once this is accomplished we will need to pursue discharge disposition either to home with family caring for patient or to SNF with family paying out-of-pocket for care.    Assessment & Plan:   Active Problems:   Essential hypertension   Type 2 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, with long-term current use of insulin (HCC)   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   ESRD on dialysis ([place])   Dyslipidemia   Stage 2 skin ulcer of sacral region [facility])   Cerebral thrombosis with cerebral infarction   Protein-calorie malnutrition, severe   Acute GI bleeding   Failure to thrive in adult   Rectal bleeding   Delirium due to another medical condition   Neurogenic dysphagia   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 ([place])   HCAP (healthcare-associated pneumonia)   Sepsis due to other etiology ([place])   Recurrent cerebrovascular accidents (CVAs) ([place])   End of life care  Sepsis physiology/RLL HCAP/Abn blood cxs c/w contaminant Completed 7 days of Maxipime   Failure to thrive with generalized deconditioning in context of neurogenic dysphagia Given acute stroke patient is no longer a candidate for open G-tube placement.  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-recurrent acute on subacute stroke Recent MRI 10/10 revealed right posterior frontal,  posterior temporal, parietal and occipital acute versus subacute infarct Continue Eliquis    LUE and RUE edema 2/2 superficial thrombophlebitis LUE edema chronic in nature Venous duplex neg for DVT but does demonstrate superficial thrombophlebitis Continue full dose anticoagulation    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  Given recent and recurrent stroke she is not a candidate for operative placement/open G-tube   Suspected dementia/recent delirium Continue Remeron and Effexor   ESRD on HD Continues to tolerate dialysis with intermittent episodes of tachycardia likely related to dehydration from poor oral intake-status postdialysis on 11/3 net UF 1 L Continue therapy and nursing interventions to promote ability to sit up in chair so can tolerate outpatient hemodialysis Nephrology has changed her to a Monday Thursday schedule.  They have also documented that in their opinion dialysis is not contributing to this patient's quality of life.   Neck pain Neck pain continues and she is also noted to have more diffuse pain but developed hypotension and hypoxemia with narcotic pain medications Quite somnolent today so we will change Robaxin back to IV and add as needed Toradol   Type 2 diabetes mellitus Episodes of hypoglycemia Hemoglobin A1c 5.1.  Follow CBGs and provide SSI as indicated Not eating enough for long-acting insulin Continue Neurontin for peripheral neuropathy   Stage II mid coccyx pressure injury, present on admission CT pelvis 10/10 without evidence of abscess Continue local wound care-given poor nutrition and is doubtful this wound will ever heal and likely will only get worse due to her concomitant lack of mobility            DVT prophylaxis: Eliquis Code Status:  Full Family Communication: None at ebdside Disposition:   Status is: Inpatient        Subjective:  Significant events overnight as discussed with staff, she denies any complaints today  Objective: Vitals:   09/01/21 1130 09/01/21 1157 09/01/21 1202 09/01/21 1308  BP: 120/66 (!) 159/74 (!) 146/77 109/80  Pulse: (!) 114 (!) 107 (!) 106 (!) 109  Resp:   14 16  Temp:   98.2 F (36.8 C) 98.5 F (36.9 C)  TempSrc:   Oral Oral  SpO2:    100%  Weight:   89.4 kg   Height:        Intake/Output Summary (Last 24 hours) at 09/01/2021 1318 Last data filed at 09/01/2021 1202 Gross per 24 hour  Intake 200 ml  Output -144 ml  Net 344 ml   Filed Weights   08/29/21 2300 09/01/21 0746 09/01/21 1202  Weight: 88 kg 89.8 kg 89.4 kg    Examination:  Awake Alert, oriented x to name, location and year,, pleasant, chronically ill-appearing, no apparent distress, left upper extremity contracted. Symmetrical Chest wall movement, Good air movement bilaterally, CTAB RRR,No Gallops,Rubs or new Murmurs, No Parasternal Heave +ve B.Sounds, Abd Soft, No tenderness, No rebound - guarding or rigidity. No Cyanosis, Clubbing or edema, No new Rash or bruise     Data Reviewed: I have personally reviewed following labs and imaging studies  CBC: Recent Labs  Lab 08/28/21 0951 09/01/21 0626  WBC 19.3* 7.6  HGB 8.5* 8.0*  HCT  28.6* 26.6*  MCV 89.7 86.6  PLT 385 413*    Basic Metabolic Panel: Recent Labs  Lab 08/28/21 0951 09/01/21 0800  NA 138 138  K 5.4* 5.1  CL 99 100  CO2 30 29  GLUCOSE 173* 135*  BUN 33* 42*  CREATININE 4.28* 5.12*  CALCIUM 8.6* 8.4*  PHOS 1.1* 1.9*    GFR: Estimated Creatinine Clearance: 11.2 mL/min (A) (by C-G formula based on SCr of 5.12 mg/dL (H)).  Liver Function Tests: Recent Labs  Lab 08/28/21 0951 09/01/21 0800  ALBUMIN 1.7* 1.6*    CBG: Recent Labs  Lab 08/30/21 2130 08/31/21 0600 08/31/21 1130 08/31/21 1603 09/01/21 1303  GLUCAP 230*  132* 135* 174* 101*     No results found for this or any previous visit (from the past 240 hour(s)).        Radiology Studies: No results found.      Scheduled Meds:  apixaban  5 mg Oral BID   Chlorhexidine Gluconate Cloth  6 each Topical Q0600   darbepoetin (ARANESP) injection - DIALYSIS  200 mcg Intravenous Q Mon-HD   feeding supplement  237 mL Oral TID BM   gabapentin  100 mg Oral Q12H   mirtazapine  7.5 mg Oral QHS   OLANZapine zydis  2.5 mg Oral QHS   predniSONE  5 mg Oral Q breakfast   sodium chloride flush  10-40 mL Intracatheter Q12H   venlafaxine  37.5 mg Oral BID   Continuous Infusions:  methocarbamol (ROBAXIN) IV       LOS: 104 days      Phillips Climes, MD [facility]   To contact the attending provider between 7A-7P or the covering provider during after hours 7P-7A, please log into the web site [email or web address] and access using universal [place] password for that web site. If you do not have the password, please call the hospital operator.  09/01/2021, 1:18 PM

## 2021-09-01 NOTE — Progress Notes (Addendum)
[facility] Progress Note   Subjective:  Seen in dialysis, just prior to starting treatment.  C/o pain in back, R thumb, leg.  C/o poor appetite.  No dyspnea.     Objective Vitals:   08/31/21 0923 08/31/21 1725 08/31/21 2009 09/01/21 0430  BP: 128/68 122/71 125/63 122/65  Pulse: (!) 101 100 100 98  Resp: 16 18 17 17   Temp: 98.2 F (36.8 C) 98.6 F (37 C) 98.9 F (37.2 C) 98.2 F (36.8 C)  TempSrc: Oral Axillary Oral Oral  SpO2: 100% 100% 99% 98%  Weight:      Height:       Physical Exam General: Chronically ill appearing female in NAD Heart: S1,S2 RRR No M/R/G Lungs: CTAB No WOB Abdomen: NABS, NT Extremities:No LE edema, R thumb without visible issue Dialysis Access: [facility] Drsg intact Neuro: oriented to hospital and 2022    Additional Objective Labs: Basic Metabolic Panel: Recent Labs  Lab 08/25/21 1403 08/28/21 0951  NA 139 138  K 4.8 5.4*  CL 100 99  CO2 29 30  GLUCOSE 285* 173*  BUN 21 33*  CREATININE 3.91* 4.28*  CALCIUM 8.0* 8.6*  PHOS 1.7* 1.1*    Liver Function Tests: Recent Labs  Lab 08/25/21 1403 08/28/21 0951  ALBUMIN 1.7* 1.7*    No results for input(s): LIPASE, AMYLASE in the last 168 hours. CBC: Recent Labs  Lab 08/25/21 1403 08/28/21 0951  WBC 19.5* 19.3*  HGB 9.0* 8.5*  HCT 31.0* 28.6*  MCV 90.9 89.7  PLT 346 385    Blood Culture    Component Value Date/Time   SDES BLOOD RIGHT HAND 08/21/2021 0851   SDES BLOOD LEFT HAND 08/21/2021 0851   SPECREQUEST  08/21/2021 0851    BOTTLES DRAWN AEROBIC AND ANAEROBIC Blood Culture adequate volume   SPECREQUEST  08/21/2021 0851    BOTTLES DRAWN AEROBIC AND ANAEROBIC Blood Culture adequate volume   CULT  08/21/2021 0851    NO GROWTH 5 DAYS Performed at [facility], [place] [address], [place], [place] [address]    CULT  08/21/2021 [phone]    NO GROWTH 5 DAYS Performed at [facility], [place] [address] [place], [place] [address]    REPTSTATUS 08/26/2021  FINAL 08/21/2021 0851   REPTSTATUS 08/26/2021 FINAL 08/21/2021 0851    Cardiac Enzymes: No results for input(s): CKTOTAL, CKMB, CKMBINDEX, TROPONINI in the last 168 hours. CBG: Recent Labs  Lab 08/30/21 1638 08/30/21 2130 08/31/21 0600 08/31/21 1130 08/31/21 1603  GLUCAP 233* 230* 132* 135* 174*    Iron Studies: No results for input(s): IRON, TIBC, TRANSFERRIN, FERRITIN in the last 72 hours. @lablastinr3 @ Studies/Results: No results found. Medications:  [START ON 09/02/2021] sodium chloride     [START ON 09/02/2021] sodium chloride     methocarbamol (ROBAXIN) IV      apixaban  5 mg Oral BID   Chlorhexidine Gluconate Cloth  6 each Topical Q0600   darbepoetin (ARANESP) injection - DIALYSIS  200 mcg Intravenous Q Mon-HD   feeding supplement  237 mL Oral TID BM   gabapentin  100 mg Oral Q12H   mirtazapine  7.5 mg Oral QHS   OLANZapine zydis  2.5 mg Oral QHS   predniSONE  5 mg Oral Q breakfast   sodium chloride flush  10-40 mL Intracatheter Q12H   venlafaxine  37.5 mg Oral BID     Dialysis Orders: AF MWF  3h 62min 400/500 62.5kg 3K/2.5 Ca P2 AVG HeRO    --  No Heparin - Mircera 50 mcg IV q 2 wks  (7/13)   --Venofer 50mg  IV  q wk - Parsabiv 5mg  IV q HD - not on [facility] formulary   Assessment/Plan: AMS/FTT: Mental status waxes/wanes.  Family had decided on comfort measures and has now decided they would like her to continue HD. PT evaluating.  Anemia/Rectal bleeding - GI consulted. Hx rectal ulcers.  EGD 7/27 normal, flex sig with mucosal ulceration c/w sterocoral ulcers, non bleeding. Dropped to 6.5 on 10/10. S/p prbcs on 10/10. Hgb now in the 8-9 range. Continue aranesp q Friday.  ESRD: HD was stopped 08/04/2021. Plan was for comfort measures only. However family rescinded plan. Patient resumed HD 08/14/2021 per family wishes. Starting  biweekly HD on Monday and Thursday,  HD today and next will be 11/10. Access Issues/AVG clotted: Had f'gram  6/23 /22 which showed chronic  thrombus and was started on Eliquis. AVG clotted 06/27/21, unable to be declotted d/t arm contracture. [place] placed 07/02/21.  Hypertension/volume:  was set for 1L UF today, but will reduce to 0.5L. Appears PO intake is poor.  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  C Ca 9.84 PO4 1.7 08/25/2021. Currently on oral K+Phos.  Nutrition: Alb very low 1.7. Not a candidate for feeding tube. Poor PO intake. Recent CVA with no evidence of meaningful recovery Leukocytosis -- RLL HCAP, BC with stap epi. Rec'd Maxipime for one week. Per primary.  Psych eval from 9/14 determined that patient has functional mental capacity for medical decision-making. Psych re-consulted 9/23 to evaluate the patient again - at this point, patient does not have the capacity to make medical decisions. Ongoing discussions with patient's son continues Dispo: DNR/DNI. Poor prognosis. Family was interested in hospice and patient was on comfort measures, family has now changed their mind and has not been very responsive to palliative care. Patient will need to be strong enough to tolerate dialysis in a recliner before she can be discharged to outpatient - she is in bed today; will need to continue to address this. Nephrology has been clear that we do not believe hemodialysis is contributing to quality of her life.   Jannifer Hick MD [facility] Pager [phone]

## 2021-09-01 NOTE — Progress Notes (Signed)
UF turned off 100 normal saline bolus given for hypotension.

## 2021-09-02 DIAGNOSIS — R1319 Other dysphagia: Secondary | ICD-10-CM | POA: Diagnosis not present

## 2021-09-02 DIAGNOSIS — R627 Adult failure to thrive: Secondary | ICD-10-CM | POA: Diagnosis not present

## 2021-09-02 DIAGNOSIS — E43 Unspecified severe protein-calorie malnutrition: Secondary | ICD-10-CM | POA: Diagnosis not present

## 2021-09-02 DIAGNOSIS — N186 End stage renal disease: Secondary | ICD-10-CM | POA: Diagnosis not present

## 2021-09-02 LAB — GLUCOSE, CAPILLARY
Glucose-Capillary: 136 mg/dL — ABNORMAL HIGH (ref 70–99)
Glucose-Capillary: 145 mg/dL — ABNORMAL HIGH (ref 70–99)
Glucose-Capillary: 108 mg/dL — ABNORMAL HIGH (ref 70–99)
Glucose-Capillary: 96 mg/dL (ref 70–99)

## 2021-09-02 NOTE — TOC Progression Note (Signed)
Transition of Care [facility]) - Progression Note    Patient Details  Name: Amy [name] MRN: [id number] Date of Birth: [birth year]  Transition of Care [facility]) CM/SW [place], RN Phone Number: 09/02/2021, 8:27 AM  Clinical Narrative:    CM and MSW with DTP Team continue to follow the patient for Transitions of Care needs - including palliative care support needs.  The patient continues to receive dialysis at the request of the family at this time.  The patient has continued poor PO intake and the family has declined admission to an inpatient hospice facility.   Expected Discharge Plan: [place] Barriers to Discharge: Continued Medical Work up  Expected Discharge Plan and Services Expected Discharge Plan: [place] In-house Referral: Clinical Social Work     Living arrangements for the past 2 months: [place] (Patient came to hospital from [place])                                       Social Determinants of Health (SDOH) Interventions    Readmission Risk Interventions Readmission Risk Prevention Plan 08/04/2021  Transportation Screening Complete  Medication Review [employer]) Complete  PCP or Specialist appointment within 3-5 days of discharge Complete  HRI or Home Care Consult Complete  SW Recovery Care/Counseling Consult Complete  Palliative Care Screening Complete  Skilled Nursing Facility Complete  Some recent data might be hidden

## 2021-09-02 NOTE — Progress Notes (Addendum)
[facility] PROGRESS NOTE  Amy [name] SJG:[id number] DOB: May 18, 1946 DOA: 05/19/2021 PCP: [name], MD                                        10/28 trying to smile during hemodialysis treatment difficult due to positioning in bed   Status:  Remains inpatient appropriate because: Unsafe d/c plan, IV treatments appropriate due to intensity of illness or inability to take PO, and Inpatient level of care appropriate due to severity of illness patient continues to require dialysis as requested by family and until she is able to transition to the standards of outpatient dialysis she must remain in the hospital.  As of 11/8 patient has not demonstrated the capacity or ability to tolerate dialysis up in a chair which is a requirement for outpatient hemodialysis.  Nephrology notified that at this juncture a discussion needs to be held with the family regarding inability to tolerate outpatient hemodialysis and current perception of futility with continued treatments including dialysis.  Barriers to discharge:  Patient needs to be able to tolerate transport to an outpatient dialysis center.  She needs to demonstrate tolerance to sitting in recliner for 3-1/2 to 4 hours. Given her refusal to eat institutional food and her preference for home-cooked meals best discharge option would be home with family for 24/7 care if they are able to obtain private pay home health services   Level of care:  Med-Surg  Code Status: DNR Family communication: 11/04 updated son and goddaughter by phone DVT prophylaxis: Eliquis COVID vaccination status: [name] 02/26/2020, Amy [name] 04/10/2021    HPI: 75 y.o. 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 Patient presented to the ED on 7/25 with dark stool.  On presentation, hemoglobin was 9.6; GI was consulted.  She underwent EGD which was normal and flexible sigmoidoscopy which showed nonbleeding distal rectal ulcers/stercoral ulcers.  Subsequently, GI signed off.  Neurology was also consulted for altered mental status; MRI brain was similar to prior MRI at [facility] with a large right MCA stroke.  EEG this admission unremarkable. PEG tube was desired by family but could not be placed because of overlying transverse colon.  General surgery was consulted for G-tube placement. Patient deemed high risk for open placement of gastrostomy tube.  Subsequently she has suffered another stroke which precludes her from any surgical procedures including open G-tube placement.  This was discussed with her son who was in agreement.  Unfortunately he was more conflicted about stopping dialysis noting prior to the stroke and subsequently since that time each time he is asked his mother regarding stopping dialysis she has told him that she does not want to die and wants to continue dialysis.  Consideration given to transitioning to residential hospice once family in agreement about how to proceed with end-of-life issues.  Patient did have an episode of decline immediately following dialysis on 10/20 but subsequently recovered.  She continues not to eat and intermittently takes medications by mouth.  At this point since patient remains relatively stable despite not eating plan is to determine if she is able to tolerate dialysis in a chair which is  a requirement for outpatient hemodialysis.  Once this is accomplished we will need to pursue discharge disposition either to home with family caring for patient or to SNF with family paying out-of-pocket for care.   Subjective: Patient awakens.  Is very lethargic.  When I asked her if she was hungry she briefly replied "uh  huh" but then went back to sleep.  Full breakfast tray at bedside and has not been touched.  Objective: Vitals:   09/01/21 2032 09/02/21 0453  BP: (!) 141/67 (!) 151/77  Pulse: (!) 107 100  Resp: 18 18  Temp: 98.9 F (37.2 C) 98.6 F (37 C)  SpO2: 100% 100%    Intake/Output Summary (Last 24 hours) at 09/02/2021 0739 Last data filed at 09/01/2021 2100 Gross per 24 hour  Intake 345 ml  Output -146 ml  Net 491 ml    Filed Weights   08/29/21 2300 09/01/21 0746 09/01/21 1202  Weight: 88 kg 89.8 kg 89.4 kg    Exam:  Constitutional: Lethargic but awakens to voice and briefly replies when spoken to Respiratory: Stable on room air, O2 sats 100%, no increased work of breathing, lungs CTA but diminished in the bases Cardiovascular: S1-S2, pulse regular and tachycardic, will bilateral UEs edema Abdomen: LBM 11/06, abdomen soft nontender nondistended.  Bowel sounds + Neurologic: CN 2-12 grossly intact on visual inspection-sensation remains intact, very limited spontaneous movement.  Unable to test strength but appears to be 1/5 x 4 extremities Psychiatric: Awake.  Oriented to person only  Assessment/Plan: Acute problems: Sepsis physiology/RLL HCAP/Abn blood cxs c/w contaminant Completed 7 days of Maxipime  Failure to thrive with generalized deconditioning in context of neurogenic dysphagia Given acute stroke patient is no longer a candidate for open G-tube placement.  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-recurrent acute on subacute stroke Recent MRI 10/10 revealed right posterior frontal, posterior temporal, parietal and occipital acute versus subacute infarct Continue Eliquis   LUE and  RUE edema 2/2 superficial thrombophlebitis LUE edema chronic in nature Venous duplex neg for DVT but does demonstrate superficial thrombophlebitis Continue full dose anticoagulation   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  Given recent and recurrent stroke she is not a candidate for operative placement/open G-tube  Suspected dementia/recent delirium Continue Remeron and Effexor  ESRD on HD Continues with hemodialysis but only on 2 days a week.  Tinges to have intermittent tachycardia as well as requirement for fluid challenges during dialysis Has not demonstrated ability to sit up in chair to tolerate outpatient hemodialysis noting she has tolerated anywhere from 2.5-3.5 hours up in chair in room but has not yet attempted up in chair during dialysis sessions.  She will also need to tolerate transport back and forth from home/SNF to dialysis facility at a minimum of twice weekly 11/8 ethics committee consulted for input 11/8 nephrology plans to attempt to get in touch with patient's son and goddaughter to discuss candidacy for further dialysis treatments focusing on patient's inability to tolerate dialysis in a chair and perceived futility of continued dialysis noting they have also documented that in their opinion dialysis is  not contributing to this patient's quality of life.  Neck pain Seems better on Robaxin and Toradol  Type 2 diabetes mellitus Episodes of hypoglycemia Hemoglobin A1c 5.1.  Follow CBGs and provide SSI as indicated Not eating enough for long-acting insulin Continue Neurontin for peripheral neuropathy  Stage II mid coccyx pressure injury, present on admission Continue local wound care-given poor nutrition and is doubtful this wound will ever heal and likely will only get worse  due to her concomitant lack of mobility        Data Reviewed:  CBC: Recent Labs  Lab 08/28/21 0951 09/01/21 0626  WBC 19.3* 7.6  HGB 8.5* 8.0*  HCT 28.6* 26.6*  MCV 89.7 86.6  PLT 385 413*     CBG: Recent Labs  Lab 08/31/21 1603 09/01/21 1303 09/01/21 1624 09/01/21 2043 09/02/21 0645  GLUCAP 174* 101* 220* 196* 136*     Scheduled Meds:  apixaban  5 mg Oral BID   Chlorhexidine Gluconate Cloth  6 each Topical Q0600   darbepoetin (ARANESP) injection - DIALYSIS  200 mcg Intravenous Q Mon-HD   feeding supplement  237 mL Oral TID BM   gabapentin  100 mg Oral Q12H   mirtazapine  7.5 mg Oral QHS   OLANZapine zydis  2.5 mg Oral QHS   predniSONE  5 mg Oral Q breakfast   sodium chloride flush  10-40 mL Intracatheter Q12H   venlafaxine  37.5 mg Oral BID     Active Problems:   Essential hypertension   Type 2 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, with long-term current use of insulin (HCC)   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   ESRD on dialysis ([place])   Dyslipidemia   Stage 2 skin ulcer of sacral region ([place])   Cerebral thrombosis with cerebral infarction   Protein-calorie malnutrition, severe   Acute GI bleeding   Failure to thrive in adult   Rectal bleeding   Delirium due to another medical condition   Neurogenic dysphagia   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

## 2021-09-02 NOTE — Progress Notes (Addendum)
[facility] Progress Note   Subjective: Seen in room. Less responsive today than yesterday. Very lethargic. Not talking.   Objective Vitals:   09/01/21 1308 09/01/21 1625 09/01/21 2032 09/02/21 0453  BP: 109/80 (!) 162/72 (!) 141/67 (!) 151/77  Pulse: (!) 109 (!) 109 (!) 107 100  Resp: 16 18 18 18   Temp: 98.5 F (36.9 C) 98 F (36.7 C) 98.9 F (37.2 C) 98.6 F (37 C)  TempSrc: Oral  Oral Axillary  SpO2: 100%  100% 100%  Weight:      Height:       Physical Exam General: Chronically ill appearing female in NAD Heart: S1,S2 RRR No M/R/G Lungs: CTAB No WOB Abdomen: NABS, NT Extremities:No LE edema, R thumb without visible issue Dialysis Access: [facility] Drsg intact Neuro: oriented to hospital and 2022   Additional Objective Labs: Basic Metabolic Panel: Recent Labs  Lab 08/28/21 0951 09/01/21 0800  NA 138 138  K 5.4* 5.1  CL 99 100  CO2 30 29  GLUCOSE 173* 135*  BUN 33* 42*  CREATININE 4.28* 5.12*  CALCIUM 8.6* 8.4*  PHOS 1.1* 1.9*   Liver Function Tests: Recent Labs  Lab 08/28/21 0951 09/01/21 0800  ALBUMIN 1.7* 1.6*   No results for input(s): LIPASE, AMYLASE in the last 168 hours. CBC: Recent Labs  Lab 08/28/21 0951 09/01/21 0626  WBC 19.3* 7.6  HGB 8.5* 8.0*  HCT 28.6* 26.6*  MCV 89.7 86.6  PLT 385 413*   Blood Culture    Component Value Date/Time   SDES BLOOD RIGHT HAND 08/21/2021 0851   SDES BLOOD LEFT HAND 08/21/2021 0851   SPECREQUEST  08/21/2021 0851    BOTTLES DRAWN AEROBIC AND ANAEROBIC Blood Culture adequate volume   SPECREQUEST  08/21/2021 0851    BOTTLES DRAWN AEROBIC AND ANAEROBIC Blood Culture adequate volume   CULT  08/21/2021 0851    NO GROWTH 5 DAYS Performed at [facility], [place] [address], [place], [place] [address]    CULT  08/21/2021 [phone]    NO GROWTH 5 DAYS Performed at [facility], [place] [address] [place],  [address]    REPTSTATUS 08/26/2021 FINAL 08/21/2021 0851   REPTSTATUS  08/26/2021 FINAL 08/21/2021 0851    Cardiac Enzymes: No results for input(s): CKTOTAL, CKMB, CKMBINDEX, TROPONINI in the last 168 hours. CBG: Recent Labs  Lab 08/31/21 1603 09/01/21 1303 09/01/21 1624 09/01/21 2043 09/02/21 0645  GLUCAP 174* 101* 220* 196* 136*   Iron Studies: No results for input(s): IRON, TIBC, TRANSFERRIN, FERRITIN in the last 72 hours. @lablastinr3 @ Studies/Results: No results found. Medications:  methocarbamol (ROBAXIN) IV      apixaban  5 mg Oral BID   Chlorhexidine Gluconate Cloth  6 each Topical Q0600   darbepoetin (ARANESP) injection - DIALYSIS  200 mcg Intravenous Q Mon-HD   feeding supplement  237 mL Oral TID BM   gabapentin  100 mg Oral Q12H   mirtazapine  7.5 mg Oral QHS   OLANZapine zydis  2.5 mg Oral QHS   predniSONE  5 mg Oral Q breakfast   sodium chloride flush  10-40 mL Intracatheter Q12H   venlafaxine  37.5 mg Oral BID     Dialysis Orders: AF MWF  3h 5min 400/500 62.5kg 3K/2.5 Ca P2 AVG HeRO    --No Heparin - Mircera 50 mcg IV q 2 wks  (7/13)   --Venofer 50mg  IV  q wk - Parsabiv 5mg  IV q HD - not on [facility] formulary  Assessment/Plan: AMS/FTT: Mental status waxes/wanes.  Family had decided on comfort measures and has now decided they would like her to continue HD. PT evaluating.  Anemia/Rectal bleeding - GI consulted. Hx rectal ulcers.  EGD 7/27 normal, flex sig with mucosal ulceration c/w sterocoral ulcers, non bleeding. Dropped to 6.5 on 10/10. S/p prbcs on 10/10. Hgb now in the 8-9 range. Continue aranesp q Friday.  ESRD: HD was stopped 08/04/2021. Plan was for comfort measures only. However family rescinded plan. Patient resumed HD 08/14/2021 per family wishes. Starting  biweekly HD on Monday and Thursday,  HD today and next will be 11/10.  Hyperkalemia: K+ in 5 range on bi weekly HD. Check RFP in AM. May need to add QOD [place].   Access Issues/AVG clotted: Had f'gram  6/23 /22 which showed chronic thrombus and was started on  Eliquis. AVG clotted 06/27/21, unable to be declotted d/t arm contracture. [place] placed 07/02/21.  Hypertension/volume:  was set for 1L UF today, but will reduce to 0.5L. Appears PO intake is poor.  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  C Ca 9.84 PO4 1.7 08/25/2021. Currently on oral K+Phos.  Nutrition: Alb very low 1.7. Not a candidate for feeding tube. Poor PO intake. Recent CVA with no evidence of meaningful recovery Leukocytosis -- RLL HCAP, BC with stap epi. Rec'd Maxipime for one week. Per primary.  Psych eval from 9/14 determined that patient has functional mental capacity for medical decision-making. Psych re-consulted 9/23 to evaluate the patient again - at this point, patient does not have the capacity to make medical decisions. Ongoing discussions with patient's son continues Dispo: DNR/DNI. Poor prognosis. Family was interested in hospice and patient was on comfort measures, family has now changed their mind and has not been very responsive to palliative care. Patient will need to be strong enough to tolerate dialysis in a recliner before she can be discharged to outpatient - she is in bed for HD 11/07; will need to continue to address this. Nephrology has been clear that we do not believe hemodialysis is contributing to quality of her life.     Rita H. Brown NP-C 09/02/2021, 10:09 AM  [employer] [phone]  I personally saw and evaluated the patient and agree with the assessment and plan by Juanell Fairly, NP.   Attempting to reach family - son [name] [phone] - to discuss her downward trajectory, inability to eat reliably with no plans for G tube placement, inability to tolerate sitting in recliner for periods long enough to take dialysis, all of which limit her disposition options.  At this point it appears the best option will be hospice care and I will recommend discontinuation of dialysis.  When I called I reached Brian's voicemail.    Ethics is now consulting on this case as well.    Jannifer Hick MD [facility] Pager [phone]

## 2021-09-02 NOTE — Progress Notes (Signed)
Occupational Therapy Treatment Patient Details Name: Amy [name] MRN: [id number] DOB: 11-29-1945 Today's Date: 09/02/2021   History of present illness 75 y.o. female admitted from short-term rehab at SNF on 05/19/21 with dark stool and fecal occult. S/p EGD and sigmoidoscopy which showed stercoral ulcers. Plan for PEG tube placement 8/4. Pt with decline in the past few months with failure to thrive, acute metabolic encephalopathy. 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 PMH includes ESRD (on HD), HTN, CVA (residual L-side hemiparesis).   OT comments  Pt at this time making limited progress as requires total assist x2 for peri care  and total assist x1 for rolling side to side in bed. Discussed with nursing about OOB timing schedule and last reported out of bed for 3 and half hours. Pt currently with functional limitations due to the deficits listed below (see OT Problem List).  Pt will benefit from skilled OT to increase their safety and independence with ADL and functional mobility for ADL to facilitate discharge to venue listed below.     Recommendations for follow up therapy are one component of a multi-disciplinary discharge planning process, led by the attending physician.  Recommendations may be updated based on patient status, additional functional criteria and insurance authorization.    Follow Up Recommendations  Long-term institutional care without follow-up therapy    Assistance Recommended at Discharge Frequent or [facility] bed;Other (comment)    Recommendations for Other Services Other (comment)    Precautions / Restrictions Precautions Precautions: Fall;Other (comment) Precaution Comments: L hemiplegia Restrictions Weight Bearing Restrictions: No       Mobility Bed Mobility Overal bed mobility: Needs Assistance Bed Mobility: Rolling Rolling: Total assist;+2 for physical assistance;+2 for  safety/equipment         General bed mobility comments: pt did not reach for bed rail with max hand over hand    Transfers                   General transfer comment: Pt required bandages changed prior to transfer     Balance                                           ADL either performed or assessed with clinical judgement   ADL Overall ADL's : Needs assistance/impaired Eating/Feeding: Total assistance   Grooming: Total assistance   Upper Body Bathing: Total assistance   Lower Body Bathing: Total assistance;+2 for physical assistance   Upper Body Dressing : Total assistance   Lower Body Dressing: Total assistance;+2 for physical assistance       Toileting- Clothing Manipulation and Hygiene: Total assistance   Tub/ Shower Transfer: Total assistance;+2 for physical assistance   Functional mobility during ADLs: Total assistance General ADL Comments: total assist x2 with most ADLS, pt unable to cue they were soiled from loose BM    Extremity/Trunk Assessment Upper Extremity Assessment Upper Extremity Assessment: LUE deficits/detail;RUE deficits/detail RUE Deficits / Details: edema at R elbow LUE Deficits / Details: No active movement noted in LUE, significant tone, flexor/add/IR contracture LUE Coordination: decreased gross motor;decreased fine motor   Lower Extremity Assessment Lower Extremity Assessment: Defer to PT evaluation        Vision   Additional Comments: cervical rotation to R side and gaze   Perception     Praxis  Cognition Arousal/Alertness: Awake/alert   Overall Cognitive Status: No family/caregiver present to determine baseline cognitive functioning Area of Impairment: Orientation;Memory;Following commands;Attention;Safety/judgement;Awareness;Problem solving                 Orientation Level: Disoriented to;Place;Time;Situation             General Comments: Pt nodded head slightly when asking if  ok          Exercises Exercises: Other exercises;General Upper Extremity General Exercises - Upper Extremity Shoulder Flexion: PROM;10 reps;Supine Shoulder Extension: PROM;Both;10 reps;Supine Shoulder ABduction: PROM;10 reps;Supine Elbow Extension: PROM;Both;10 reps;Supine Wrist Extension: PROM;Both;10 reps;Supine Composite Extension: PROM;Both;10 reps;Supine   Shoulder Instructions       General Comments      Pertinent Vitals/ Pain       Pain Assessment: Faces Pain Score: 2  Breathing: normal Negative Vocalization: none Facial Expression: smiling or inexpressive Body Language: relaxed Consolability: no need to console PAINAD Score: 0 Facial Expression: Tense Body Movements: Protection Muscle Tension: Tense, rigid Compliance with ventilator (intubated pts.): N/A Vocalization (extubated pts.): N/A CPOT Total: 3 Pain Location: LUE with PROM and bed mobility Pain Descriptors / Indicators: Grimacing;Guarding Pain Intervention(s): Limited activity within patient's tolerance;Repositioned  Home Living                                          Prior Functioning/Environment              Frequency  Min 1X/week        Progress Toward Goals  OT Goals(current goals can now be found in the care plan section)  Progress towards OT goals: Progressing toward goals  Acute Rehab OT Goals Patient Stated Goal: pt unable to report OT Goal Formulation: Patient unable to participate in goal setting Time For Goal Achievement: 09/04/21 Potential to Achieve Goals: Good ADL Goals Pt/caregiver will Perform Home Exercise Program: Increased ROM;Both right and left upper extremity;With minimal assist Additional ADL Goal #1: Pt will tolerate OOB in recliner for  3 hour in preparation for tolerating dialysis Additional ADL Goal #2: Pt caregiver will verbalize understanding of positioning techinques with Min cues  Plan Discharge plan remains appropriate     Co-evaluation                 AM-PAC OT "6 Clicks" Daily Activity     Outcome Measure   Help from another person eating meals?: Total Help from another person taking care of personal grooming?: Total Help from another person toileting, which includes using toliet, bedpan, or urinal?: Total Help from another person bathing (including washing, rinsing, drying)?: Total Help from another person to put on and taking off regular upper body clothing?: Total Help from another person to put on and taking off regular lower body clothing?: Total 6 Click Score: 6    End of Session    OT Visit Diagnosis: Unsteadiness on feet (R26.81);Other abnormalities of gait and mobility (R26.89);Muscle weakness (generalized) (M62.81);Feeding difficulties (R63.3);Hemiplegia and hemiparesis;Pain   Activity Tolerance Patient tolerated treatment well   Patient Left in bed;with call bell/phone within reach   Nurse Communication Other (comment) (bandage changes, reviewed over with chart scheduling)        Time: 4196-2229 OT Time Calculation (min): 33 min  Charges: OT General Charges $OT Visit: 1 Visit OT Treatments $Self Care/Home Management : 23-37 mins  Joeseph Amor OTR/L  Acute Rehab Services  [phone]  office number [phone] pager number   Joeseph Amor 09/02/2021, 9:58 AM

## 2021-09-03 DIAGNOSIS — R627 Adult failure to thrive: Secondary | ICD-10-CM | POA: Diagnosis not present

## 2021-09-03 DIAGNOSIS — R1319 Other dysphagia: Secondary | ICD-10-CM | POA: Diagnosis not present

## 2021-09-03 DIAGNOSIS — N186 End stage renal disease: Secondary | ICD-10-CM | POA: Diagnosis not present

## 2021-09-03 DIAGNOSIS — I6330 Cerebral infarction due to thrombosis of unspecified cerebral artery: Secondary | ICD-10-CM | POA: Diagnosis not present

## 2021-09-03 LAB — RENAL FUNCTION PANEL
Albumin: <1.5 g/dL — ABNORMAL LOW (ref 3.5–5.0)
Anion gap: 13 (ref 5–15)
BUN: 26 mg/dL — ABNORMAL HIGH (ref 8–23)
CO2: 24 mmol/L (ref 22–32)
Calcium: 8 mg/dL — ABNORMAL LOW (ref 8.9–10.3)
Chloride: 98 mmol/L (ref 98–111)
Creatinine, Ser: 3.88 mg/dL — ABNORMAL HIGH (ref 0.44–1.00)
GFR, Estimated: 12 mL/min — ABNORMAL LOW (ref >60)
Glucose, Bld: 149 mg/dL — ABNORMAL HIGH (ref 70–99)
Phosphorus: 1.9 mg/dL — ABNORMAL LOW (ref 2.5–4.6)
Potassium: 4.4 mmol/L (ref 3.5–5.1)
Sodium: 135 mmol/L (ref 135–145)

## 2021-09-03 LAB — GLUCOSE, CAPILLARY
Glucose-Capillary: 128 mg/dL — ABNORMAL HIGH (ref 70–99)
Glucose-Capillary: 120 mg/dL — ABNORMAL HIGH (ref 70–99)
Glucose-Capillary: 118 mg/dL — ABNORMAL HIGH (ref 70–99)
Glucose-Capillary: 101 mg/dL — ABNORMAL HIGH (ref 70–99)

## 2021-09-03 MED ORDER — RENA-VITE PO TABS
1.0000 | ORAL_TABLET | Freq: Every day | ORAL | Status: DC
  Administered 2021-09-03 – 2021-09-05 (×3): 1 via ORAL
  Filled 2021-09-03 (×3): qty 1

## 2021-09-03 MED ORDER — POTASSIUM & SODIUM PHOSPHATES 280-160-250 MG PO PACK
1.0000 | PACK | Freq: Every day | ORAL | Status: AC
  Administered 2021-09-03 – 2021-09-05 (×3): 1 via ORAL
  Filled 2021-09-03 (×3): qty 1

## 2021-09-03 MED ORDER — PROSOURCE PLUS PO LIQD
30.0000 mL | Freq: Two times a day (BID) | ORAL | Status: DC
  Administered 2021-09-05 – 2021-09-08 (×3): 30 mL via ORAL
  Filled 2021-09-03 (×5): qty 30

## 2021-09-03 NOTE — Progress Notes (Deleted)
[facility] Progress Note   Subjective: Minimally responsive.  She was in recliner yesterday from 11:30a to 4:30p.   Objective Vitals:   09/02/21 1659 09/02/21 2130 09/03/21 0424 09/03/21 0913  BP: (!) 134/102 (!) 153/68 (!) 112/39 (!) 101/41  Pulse: (!) 103 (!) 106 (!) 105 (!) 104  Resp: 16 20 18 16   Temp: (!) 97.4 F (36.3 C) 100 F (37.8 C) 98.9 F (37.2 C) 99.3 F (37.4 C)  TempSrc: Oral Axillary Axillary Oral  SpO2: 100% 100% 96% 99%  Weight:  90.3 kg    Height:       Physical Exam General: Chronically ill appearing female in NAD Heart: S1,S2 RRR No M/R/G Lungs: CTAB No WOB Abdomen: NABS, NT Extremities:No LE edema, R thumb without visible issue Dialysis Access: [facility] Drs[facility] intact Neuro: oriented to hospital and 2022   Additional Objective Labs: Basic Metabolic Panel: Recent Labs  Lab 08/28/21 0951 09/01/21 0800 09/03/21 0439  NA 138 138 135  K 5.4* 5.1 4.4  CL 99 100 98  CO2 30 29 24   GLUCOSE 173* 135* 149*  BUN 33* 42* 26*  CREATININE 4.28* 5.12* 3.88*  CALCIUM 8.6* 8.4* 8.0*  PHOS 1.1* 1.9* 1.9*    Liver Function Tests: Recent Labs  Lab 08/28/21 0951 09/01/21 0800 09/03/21 0439  ALBUMIN 1.7* 1.6* <1.5*    No results for input(s): LIPASE, AMYLASE in the last 168 hours. CBC: Recent Labs  Lab 08/28/21 0951 09/01/21 0626  WBC 19.3* 7.6  HGB 8.5* 8.0*  HCT 28.6* 26.6*  MCV 89.7 86.6  PLT 385 413*    Blood Culture    Component Value Date/Time   SDES BLOOD RIGHT HAND 08/21/2021 0851   SDES BLOOD LEFT HAND 08/21/2021 0851   SPECREQUEST  08/21/2021 0851    BOTTLES DRAWN AEROBIC AND ANAEROBIC Blood Culture adequate volume   SPECREQUEST  08/21/2021 0851    BOTTLES DRAWN AEROBIC AND ANAEROBIC Blood Culture adequate volume   CULT  08/21/2021 0851    NO GROWTH 5 DAYS Performed at [facility], [place] [address], [place], [place] [address]    CULT  08/21/2021 [phone]    NO GROWTH 5 DAYS Performed at [facility], [place] [address] [place], [place] [address]    REPTSTATUS 08/26/2021 FINAL 08/21/2021 0851   REPTSTATUS 08/26/2021 FINAL 08/21/2021 0851    Cardiac Enzymes: No results for input(s): CKTOTAL, CKMB, CKMBINDEX, TROPONINI in the last 168 hours. CBG: Recent Labs  Lab 09/02/21 0645 09/02/21 1127 09/02/21 1633 09/02/21 2132 09/03/21 0703  GLUCAP 136* 145* 108* 96 128*    Iron Studies: No results for input(s): IRON, TIBC, TRANSFERRIN, FERRITIN in the last 72 hours. @lablastinr3 @ Studies/Results: No results found. Medications:  methocarbamol (ROBAXIN) IV      (feeding supplement) PROSource Plus  30 mL Oral BID BM   apixaban  5 mg Oral BID   Chlorhexidine Gluconate Cloth  6 each Topical Q0600   darbepoetin (ARANESP) injection - DIALYSIS  200 mcg Intravenous Q Mon-HD   feeding supplement  237 mL Oral TID BM   gabapentin  100 mg Oral Q12H   mirtazapine  7.5 mg Oral QHS   multivitamin  1 tablet Oral QHS   OLANZapine zydis  2.5 mg Oral QHS   potassium & sodium phosphates  1 packet Oral QHS   predniSONE  5 mg Oral Q breakfast   sodium chloride flush  10-40 mL Intracatheter Q12H   venlafaxine  37.5 mg Oral  BID     Dialysis Orders: AF MWF  3h 16min 400/500 62.5kg 3K/2.5 Ca P2 AVG HeRO    --No Heparin - Mircera 50 mcg IV q 2 wks  (7/13)   --Venofer 50mg  IV  q wk - Parsabiv 5mg  IV q HD - not on [facility] formulary   Assessment/Plan: AMS/FTT: Mental status waxes/wanes.  Family had decided on comfort measures and has now decided they would like her to continue HD. PT evaluating.  Anemia/Rectal bleeding - GI consulted. Hx rectal ulcers.  EGD 7/27 normal, flex sig with mucosal ulceration c/w sterocoral ulcers, non bleeding. Dropped to 6.5 on 10/10. S/p prbcs on 10/10. Hgb now in the 8-9 range. Continue aranesp q Friday.  ESRD: HD was stopped 08/04/2021. Plan was for comfort measures only. However family rescinded plan. Patient resumed HD 08/14/2021 per family wishes. Starting  biweekly HD on  Monday and Thursday,  HD today and next will be 11/10.  Hyperkalemia: K+ in 5 range on bi weekly HD. Check RFP in AM. May need to add QOD Lokelma.   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 AVG clotted 06/27/21, unable to be declotted d/t arm contracture. [place] placed 07/02/21.  Hypertension/volume:  was set for 1L UF today, but will reduce to 0.5L. Appears PO intake is poor.  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  C Ca 9.84 PO4 1.7 08/25/2021. Currently on oral K+Phos.  Nutrition: Alb very low 1.7. Not a candidate for feeding tube. Poor PO intake. Recent CVA with no evidence of meaningful recovery Leukocytosis -- RLL HCAP, BC with stap epi. Rec'd Maxipime for one week. Per primary.  Psych eval from 9/14 determined that patient has functional mental capacity for medical decision-making. Psych re-consulted 9/23 to evaluate the patient again - at this point, patient does not have the capacity to make medical decisions. Ongoing discussions with patient's son continues Dispo: DNR/DNI. Poor prognosis. Family was interested in hospice and patient was on comfort measures, family has now changed their mind and has not been very responsive to palliative care. Patient will need to be strong enough to tolerate dialysis in a recliner before she can be discharged to outpatient - she is in bed for HD 11/07; will need to continue to address this. Nephrology has been clear that we do not believe hemodialysis is contributing to quality of her life.     Rita H. Brown NP-C 09/03/2021, 10:50 AM  [employer] [phone]  I personally saw and evaluated the patient and agree with the assessment and plan by Juanell Fairly, NP.   Was in recliner for 4 hrs yesterday but per RN po intake is still extremely poor.  Only a few sips of ensure here and there.   Was able to reach [place], son, this AM.  We had a good conversation regarding the concerns for ongoing lack of  nutrition with poor po intake, increasing weakness and concern that dialysis is not contributing to quality of life.  I recommended discontinuation of dialysis.  He was receptive to discontinuation of dialysis but really defers to his mom who he feels like can still communicate with him in a meaningful way.  He is planning to visit her today and have the conversation with her re: d/c of dialysis and other medical therapies with plans to move to hospice care.  He understands that ultimately this decision may fall to him given HCPOA status.  We agreed to reconvene about  this topic tomorrow AM.   As previously indicated - Ethics is now consulting on this case as well.   Jannifer Hick MD [facility] Pager [phone]

## 2021-09-03 NOTE — Progress Notes (Signed)
Patient drank half an ensure with medications, patient expressed she was experiencing back pain, gave PRN Tylenol with water.

## 2021-09-03 NOTE — Progress Notes (Signed)
Physical Therapy Treatment Patient Details Name: Amy [name] MRN: [id number] DOB: [birth year] Today's Date: 09/03/2021   History of Present Illness 75 y.o. female admitted from short-term rehab at SNF on 05/19/21 with dark stool and fecal occult. S/p EGD and sigmoidoscopy which showed stercoral ulcers. Plan for PEG tube placement 8/4. Pt with decline in the past few months with failure to thrive, acute metabolic encephalopathy. 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 PMH includes ESRD (on HD), HTN, CVA (residual L-side hemiparesis).    PT Comments    Pt tolerated manual cervical stretching with soft tissue mobilization and maxi move lift from bed to chair. Played gospel music for pt engagement. Pt now has daily lifting schedule for transfers to the chair which nursing staff has been utilizing. Pt has been tolerating up to 4 hours daily.    Recommendations for follow up therapy are one component of a multi-disciplinary discharge planning process, led by the attending physician.  Recommendations may be updated based on patient status, additional functional criteria and insurance authorization.  Follow Up Recommendations  Long-term institutional care without follow-up therapy     Assistance Recommended at Discharge Frequent or [facility] bed;Other (comment) (hoyer lift)    Recommendations for Other Services       Precautions / Restrictions Precautions Precautions: Fall Precaution Comments: L hemiplegia Required Braces or Orthoses: Other Brace Other Brace: L palm guard Restrictions Weight Bearing Restrictions: No     Mobility  Bed Mobility Overal bed mobility: Needs Assistance Bed Mobility: Rolling Rolling: Total assist;+2 for physical assistance         General bed mobility comments: totalA to roll R/L for peri care, dressing change, and lift pad placement    Transfers                   General  transfer comment: maximove transfer bed to chair Transfer via Lift Equipment: Maximove  Ambulation/Gait                   Stairs             Wheelchair Mobility    Modified Rankin (Stroke Patients Only)       Balance                                            Cognition Arousal/Alertness: Awake/alert Behavior During Therapy: Flat affect Overall Cognitive Status: No family/caregiver present to determine baseline cognitive functioning Area of Impairment: Orientation;Memory;Following commands;Attention;Safety/judgement;Awareness;Problem solving                 Orientation Level: Disoriented to;Place;Time;Situation     Following Commands: Follows one step commands inconsistently       General Comments: Pt able to respond to "yes, no" questions, and states "ok," when explaining what we are about to do. Often perseverates on "ouch," even when not actively touching her        Exercises General Exercises - Upper Extremity Shoulder Flexion: PROM;Right;10 reps;Supine Elbow Flexion: Right;10 reps;Supine;AAROM Elbow Extension: Right;10 reps;Supine;AAROM Other Exercises Other Exercises: Supine: cervical stretch to neutral with L upper trapezius soft tissue mobilization, followed by left cervical rotation stretch Other Exercises: PROM LUE wrist extension stretching    General Comments        Pertinent Vitals/Pain Pain Assessment: Faces Faces Pain Scale: Hurts even more Breathing: normal Negative  Vocalization: none Facial Expression: facial grimacing Body Language: relaxed Consolability: no need to console PAINAD Score: 2 Facial Expression: Grimacing Body Movements: Protection Muscle Tension: Tense, rigid Compliance with ventilator (intubated pts.): N/A Vocalization (extubated pts.): N/A CPOT Total: 4 Pain Location: grimacing and states "ouch," with all mobility Pain Descriptors / Indicators: Grimacing;Guarding Pain  Intervention(s): Limited activity within patient's tolerance;Monitored during session;Repositioned    Home Living                          Prior Function            PT Goals (current goals can now be found in the care plan section) Acute Rehab PT Goals Time For Goal Achievement: 09/17/21 Potential to Achieve Goals: Poor Additional Goals Additional Goal #1: Pt will tolerate 4 hours in the chair to prepare for outpatient dialysis Progress towards PT goals: Not progressing toward goals - comment    Frequency    Min 1X/week      PT Plan Current plan remains appropriate    Co-evaluation              AM-PAC PT "6 Clicks" Mobility   Outcome Measure  Help needed turning from your back to your side while in a flat bed without using bedrails?: Total Help needed moving from lying on your back to sitting on the side of a flat bed without using bedrails?: Total Help needed moving to and from a bed to a chair (including a wheelchair)?: Total Help needed standing up from a chair using your arms (e.g., wheelchair or bedside chair)?: Total Help needed to walk in hospital room?: Total Help needed climbing 3-5 steps with a railing? : Total 6 Click Score: 6    End of Session   Activity Tolerance: Patient limited by pain Patient left: with call bell/phone within reach;in chair Nurse Communication: Mobility status PT Visit Diagnosis: Other abnormalities of gait and mobility (R26.89);Hemiplegia and hemiparesis;Other symptoms and signs involving the nervous system (R29.898) Hemiplegia - Right/Left: Left     Time: 1025-8527 PT Time Calculation (min) (ACUTE ONLY): 34 min  Charges:  $Therapeutic Exercise: 8-22 mins $Therapeutic Activity: 8-22 mins                     Wyona Almas, PT, DPT Acute Rehabilitation Services Pager [phone] Office [phone]    Deno Etienne 09/03/2021, 12:53 PM

## 2021-09-03 NOTE — Progress Notes (Signed)
[facility] Progress Note   Subjective: Remains minimally responsive. Doubtful she will be able to tolerated HD in chair tomorrow.    Objective Vitals:   09/02/21 1659 09/02/21 2130 09/03/21 0424 09/03/21 0913  BP: (!) 134/102 (!) 153/68 (!) 112/39 (!) 101/41  Pulse: (!) 103 (!) 106 (!) 105 (!) 104  Resp: 16 20 18 16   Temp: (!) 97.4 F (36.3 C) 100 F (37.8 C) 98.9 F (37.2 C) 99.3 F (37.4 C)  TempSrc: Oral Axillary Axillary Oral  SpO2: 100% 100% 96% 99%  Weight:  90.3 kg    Height:       Physical Exam General: Chronically ill appearing female in NAD Heart: S1,S2 RRR No M/R/G Lungs: CTAB No WOB Abdomen: NABS, NT Extremities:No LE edema, R thumb without visible issue Dialysis Access: [facility] Drsg intact Neuro: oriented to hospital and 2022    Additional Objective Labs: Basic Metabolic Panel: Recent Labs  Lab 08/28/21 0951 09/01/21 0800 09/03/21 0439  NA 138 138 135  K 5.4* 5.1 4.4  CL 99 100 98  CO2 30 29 24   GLUCOSE 173* 135* 149*  BUN 33* 42* 26*  CREATININE 4.28* 5.12* 3.88*  CALCIUM 8.6* 8.4* 8.0*  PHOS 1.1* 1.9* 1.9*   Liver Function Tests: Recent Labs  Lab 08/28/21 0951 09/01/21 0800 09/03/21 0439  ALBUMIN 1.7* 1.6* <1.5*   No results for input(s): LIPASE, AMYLASE in the last 168 hours. CBC: Recent Labs  Lab 08/28/21 0951 09/01/21 0626  WBC 19.3* 7.6  HGB 8.5* 8.0*  HCT 28.6* 26.6*  MCV 89.7 86.6  PLT 385 413*   Blood Culture    Component Value Date/Time   SDES BLOOD RIGHT HAND 08/21/2021 0851   SDES BLOOD LEFT HAND 08/21/2021 0851   SPECREQUEST  08/21/2021 0851    BOTTLES DRAWN AEROBIC AND ANAEROBIC Blood Culture adequate volume   SPECREQUEST  08/21/2021 0851    BOTTLES DRAWN AEROBIC AND ANAEROBIC Blood Culture adequate volume   CULT  08/21/2021 0851    NO GROWTH 5 DAYS Performed at [facility], [place] [address], [place], [place] [address]    CULT  08/21/2021 [phone]    NO GROWTH 5 DAYS Performed at [facility], [place] [address] [place], [place] [address]    REPTSTATUS 08/26/2021 FINAL 08/21/2021 0851   REPTSTATUS 08/26/2021 FINAL 08/21/2021 0851    Cardiac Enzymes: No results for input(s): CKTOTAL, CKMB, CKMBINDEX, TROPONINI in the last 168 hours. CBG: Recent Labs  Lab 09/02/21 0645 09/02/21 1127 09/02/21 1633 09/02/21 2132 09/03/21 0703  GLUCAP 136* 145* 108* 96 128*   Iron Studies: No results for input(s): IRON, TIBC, TRANSFERRIN, FERRITIN in the last 72 hours. @lablastinr3 @ Studies/Results: No results found. Medications:  methocarbamol (ROBAXIN) IV      apixaban  5 mg Oral BID   Chlorhexidine Gluconate Cloth  6 each Topical Q0600   darbepoetin (ARANESP) injection - DIALYSIS  200 mcg Intravenous Q Mon-HD   feeding supplement  237 mL Oral TID BM   gabapentin  100 mg Oral Q12H   mirtazapine  7.5 mg Oral QHS   OLANZapine zydis  2.5 mg Oral QHS   predniSONE  5 mg Oral Q breakfast   sodium chloride flush  10-40 mL Intracatheter Q12H   venlafaxine  37.5 mg Oral BID     Dialysis Orders: AF MWF  3h 41min 400/500 62.5kg 3K/2.5 Ca P2 AVG HeRO    --No Heparin - Mircera 50 mcg IV q 2  wks  (7/13)   --Venofer 50mg  IV  q wk - Parsabiv 5mg  IV q HD - not on [facility] formulary   Assessment/Plan: AMS/FTT: Mental status waxes/wanes.  Family had decided on comfort measures and has now decided they would like her to continue HD. PT evaluating.  Anemia/Rectal bleeding - GI consulted. Hx rectal ulcers.  EGD 7/27 normal, flex sig with mucosal ulceration c/w sterocoral ulcers, non bleeding. Dropped to 6.5 on 10/10. S/p prbcs on 10/10. Hgb now in the 8-9 range. Continue  max aranesp q Friday.  ESRD: HD was stopped 08/04/2021. Plan was for comfort measures only. However family rescinded plan. Patient resumed HD 08/14/2021 per family wishes. Starting  biweekly HD on Monday and Thursday,  Next will be 11/10.  Hyperkalemia: K+ in 5 range on bi weekly HD. Had been on KPhos for hypophosphatemia.   C  K+ 4.4 09/03/2021 Access Issues/AVG clotted: Had f'gram  6/23 /22 which showed chronic thrombus and was started on Eliquis. AVG clotted 06/27/21, unable to be declotted d/t arm contracture. [place] placed 07/02/21.  Hypertension/volume:  Appears dry. Run even in HD tomorrow.  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  C Ca 9.84 PO4 1.9. resumed  oral K+Phos.  Nutrition: Alb very low 1.7. Not a candidate for feeding tube. Poor PO intake. Protein supps ordered.  Recent CVA with no evidence of meaningful recovery Leukocytosis -- RLL HCAP, BC with stap epi. Rec'd Maxipime for one week. Per primary.  Psych eval from 9/14 determined that patient has functional mental capacity for medical decision-making. Psych re-consulted 9/23 to evaluate the patient again - at this point, patient does not have the capacity to make medical decisions. Ongoing discussions with patient's son continues Dispo: DNR/DNI. Poor prognosis. Family was interested in hospice and patient was on comfort measures, family has now changed their mind and has not been very responsive to palliative care. Patient will need to be strong enough to tolerate dialysis in a recliner before she can be discharged to outpatient - she is in bed for HD 11/07; will need to continue to address this. Nephrology has been clear that we do not believe hemodialysis is contributing to quality of her life. Ethics now consulted.     Korey Prashad H. Dalyn Becker NP-C 09/03/2021, 10:22 AM  [employer] [phone]

## 2021-09-03 NOTE — Progress Notes (Signed)
Occupational Therapy Treatment Patient Details Name: Amy [name] MRN: [id number] DOB: [birth year] Today's Date: 09/03/2021   History of present illness 75 y.o. female admitted from short-term rehab at SNF on 05/19/21 with dark stool and fecal occult. S/p EGD and sigmoidoscopy which showed stercoral ulcers. Plan for PEG tube placement 8/4. Pt with decline in the past few months with failure to thrive, acute metabolic encephalopathy. 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 PMH includes ESRD (on HD), HTN, CVA (residual L-side hemiparesis).   OT comments  Patient seen by skilled OT to address LUE palm guard and wear time. Patient received in bed and tolerated stretching for finger and wrist extension and hand hygiene to prepare for palm guard. Patient appeared to tolerate palm guard well.  Wear time posted on patient's wall and nursing informed on wear time and care. Acute OT to continue to follow.    Recommendations for follow up therapy are one component of a multi-disciplinary discharge planning process, led by the attending physician.  Recommendations may be updated based on patient status, additional functional criteria and insurance authorization.    Follow Up Recommendations  Long-term institutional care without follow-up therapy    Assistance Recommended at Discharge Frequent or [facility] bed;Other (comment)    Recommendations for Other Services      Precautions / Restrictions Precautions Precautions: Fall Precaution Comments: L hemiplegia Restrictions Weight Bearing Restrictions: No       Mobility Bed Mobility Overal bed mobility: Needs Assistance                  Transfers                         Balance                                           ADL either performed or assessed with clinical judgement   ADL Overall ADL's : Needs assistance/impaired     Grooming:  Total assistance                                 General ADL Comments: hand hygiene perfomred prior to donning palm guard    Extremity/Trunk Assessment Upper Extremity Assessment Upper Extremity Assessment: LUE deficits/detail RUE Deficits / Details: edema at R elbow LUE Deficits / Details: No active movement noted in LUE, significant tone, flexor/add/IR contracture LUE Coordination: decreased gross motor;decreased fine motor            Vision       Perception     Praxis      Cognition Arousal/Alertness: Awake/alert Behavior During Therapy: Flat affect Overall Cognitive Status: No family/caregiver present to determine baseline cognitive functioning Area of Impairment: Orientation;Memory;Following commands;Attention;Safety/judgement;Awareness;Problem solving                 Orientation Level: Disoriented to;Place;Time;Situation     Following Commands: Follows one step commands inconsistently       General Comments: patient attempted to verbalize during treatment but was difficulty to understand          Exercises Exercises: Other exercises Other Exercises Other Exercises: PROM LUE finger extension 10 reps/stretching Other Exercises: PROM LUE wrist extension stretching   Shoulder Instructions       General  Comments      Pertinent Vitals/ Pain       Pain Assessment: Faces Faces Pain Scale: Hurts even more Breathing: normal Negative Vocalization: none Facial Expression: facial grimacing Body Language: relaxed Consolability: no need to console PAINAD Score: 2 Facial Expression: Grimacing Body Movements: Protection Muscle Tension: Tense, rigid Compliance with ventilator (intubated pts.): N/A Vocalization (extubated pts.): N/A CPOT Total: 4 Pain Location: LUE PROM and hand hygiene Pain Descriptors / Indicators: Grimacing;Guarding Pain Intervention(s): Repositioned;Monitored during session  Home Living                                           Prior Functioning/Environment              Frequency  Min 1X/week        Progress Toward Goals  OT Goals(current goals can now be found in the care plan section)  Progress towards OT goals: Progressing toward goals  Acute Rehab OT Goals OT Goal Formulation: Patient unable to participate in goal setting Time For Goal Achievement: 09/04/21 Potential to Achieve Goals: Good ADL Goals Pt/caregiver will Perform Home Exercise Program: Increased ROM;Both right and left upper extremity;With minimal assist Additional ADL Goal #1: Pt will tolerate OOB in recliner for  3 hour in preparation for tolerating dialysis Additional ADL Goal #2: Pt caregiver will verbalize understanding of positioning techinques with Min cues  Plan Discharge plan remains appropriate    Co-evaluation                 AM-PAC OT "6 Clicks" Daily Activity     Outcome Measure   Help from another person eating meals?: Total Help from another person taking care of personal grooming?: Total Help from another person toileting, which includes using toliet, bedpan, or urinal?: Total Help from another person bathing (including washing, rinsing, drying)?: Total Help from another person to put on and taking off regular upper body clothing?: Total Help from another person to put on and taking off regular lower body clothing?: Total 6 Click Score: 6    End of Session Equipment Utilized During Treatment: Other (comment) (palm guard)  OT Visit Diagnosis: Unsteadiness on feet (R26.81);Other abnormalities of gait and mobility (R26.89);Muscle weakness (generalized) (M62.81);Feeding difficulties (R63.3);Hemiplegia and hemiparesis;Pain Hemiplegia - Right/Left: Left Hemiplegia - dominant/non-dominant: Non-Dominant Hemiplegia - caused by: Cerebral infarction Pain - Right/Left: Left Pain - part of body: Hand   Activity Tolerance Patient limited by pain   Patient Left in bed;with call  bell/phone within reach   Nurse Communication Other (comment) (informed nursing and tech on LUE palm guard and wear time)        Time: 0947-0962 OT Time Calculation (min): 13 min  Charges: OT General Charges $OT Visit: 1 Visit OT Treatments $Prosthetics Training: 8-22 mins  Lodema Hong, [place]  Pager [phone] Office [place] 09/03/2021, 12:32 PM

## 2021-09-03 NOTE — Progress Notes (Signed)
[facility] PROGRESS NOTE  Amy [name] STM:[id number] DOB: 1946/08/05 DOA: 05/19/2021 PCP: [name], MD                                        10/28 trying to smile during hemodialysis treatment difficult due to positioning in bed   Status:  Remains inpatient appropriate because: Unsafe d/c plan, IV treatments appropriate due to intensity of illness or inability to take PO, and Inpatient level of care appropriate due to severity of illness patient continues to require dialysis as requested by family and until she is able to transition to the standards of outpatient dialysis she must remain in the hospital.  Barriers to discharge:  Patient needs to be able to tolerate transport to an outpatient dialysis center.  She needs to demonstrate tolerance to sitting in recliner for 3-1/2 to 4 hours and thus far has been unsuccessful in meeting this requirement   Level of care:  Med-Surg  Code Status: DNR Family communication: 11/04 updated son and goddaughter by phone DVT prophylaxis: Eliquis COVID vaccination status: [name] 02/26/2020, Pfizer 04/10/2021    HPI: 75 y.o. female with PMH significant for ESRD on HD MWF, HTN, HLD, CVA s/p TPA 02/2021 with resultant left hemiparesis, partial blood clot upper extremity, recently hospitalized to [facility] on 03/13/2021-04/22/2021 during which patient underwent flexible sigmoidoscopy on 03/26/2021 which showed multiple rectal ulcers likely secondary to trauma from enemas and constipation. Patient presented to the ED on 7/25 with dark stool.  On presentation, hemoglobin was 9.6; GI was consulted.  She underwent EGD which was normal and flexible sigmoidoscopy which showed nonbleeding distal rectal ulcers/stercoral ulcers.  Subsequently, GI signed off.  Neurology was also consulted for altered mental status; MRI brain was similar to prior MRI at [facility] with a large right MCA stroke.  EEG this admission unremarkable. PEG tube was desired by family  but could not be placed because of overlying transverse colon.  General surgery was consulted for G-tube placement. Patient deemed high risk for open placement of gastrostomy tube.  Subsequently she has suffered another stroke which precludes her from any surgical procedures including open G-tube placement.  This was discussed with her son who was in agreement.  Unfortunately he was more conflicted about stopping dialysis noting prior to the stroke and subsequently since that time each time he is asked his mother regarding stopping dialysis she has told him that she does not want to die and wants to continue dialysis.  Consideration given to transitioning to residential hospice once family in agreement about how to proceed with end-of-life issues.  Patient did have an episode of decline immediately following dialysis on 10/20 but subsequently recovered.  She continues not to eat and intermittently takes medications by mouth.  At this point since patient remains relatively stable despite not eating plan is to determine if she is able to tolerate dialysis in a chair which is a requirement for outpatient hemodialysis.  Once this is accomplished we will need to pursue discharge disposition either to home with family caring for patient or to SNF with family paying out-of-pocket for care.   Subjective: Patient asleep upon my entry.  Awakened to voice.  Responded verbally when spoken to but quickly back to sleep.  Objective: Vitals:   09/02/21 2130 09/03/21 0424  BP: (!) 153/68 (!) 112/39  Pulse: (!) 106 (!) 105  Resp: 20 18  Temp: 100 F (37.8 C) 98.9 F (37.2 C)  SpO2: 100% 96%    Intake/Output Summary (Last 24 hours) at 09/03/2021 0739 Last data filed at 09/03/2021 0600 Gross per 24 hour  Intake 120 ml  Output --  Net 120 ml    Filed Weights   09/01/21 0746 09/01/21 1202 09/02/21 2130  Weight: 89.8 kg 89.4 kg 90.3 kg    Exam:  Constitutional: Remains lethargic but appears to be in no  acute distress otherwise Respiratory: Bilateral anterior lung sounds are clear to auscultation with no increased work of breathing, she is currently on room air with O2 sats 99% Cardiovascular: S1-S2, pulse regular and tachycardic, stable bilateral UEs edema Abdomen: LBM 11/09, abdomen soft nontender nondistended.  Bowel sounds + Neurologic: CN 2-12 grossly intact on visual inspection-sensation remains intact, very limited spontaneous movement.  Estimated strength 1-2/5 noting patient with limited ability to participate in testing but PT documents patient has 2+ assist and full lift into chair utilizing lifting device. Psychiatric: Awake.  Oriented to person only  Assessment/Plan: Acute problems: Sepsis physiology/RLL HCAP/Abn blood cxs c/w contaminant Completed 7 days of Maxipime  Failure to thrive with generalized deconditioning in context of neurogenic dysphagia Given acute stroke patient is no longer a candidate for open G-tube placement.  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  Given recent and recurrent  stroke she is not a candidate for operative placement/open G-tube  Suspected dementia/recent delirium Continue Remeron and Effexor  ESRD on HD Continues with hemodialysis but only on 2 days a week.  Has not demonstrated ability to sit up in chair to tolerate outpatient hemodialysis noting she has tolerated 2.5-3.5 hours up in chair in room but has not yet attempted up in chair during dialysis sessions.  She will also need to tolerate transport back and forth from home/SNF to dialysis facility at a minimum of twice weekly 11/8 ethics committee consulted for input 11/8 nephrology attempted to contact son but no answer.  Subsequently nephrologist was able to speak with son [name] on 11/9.  Recommendation was offered to discontinue dialysis noting [name] was receptive to discontinuation of dialysis but continues to defer this decision to his mother who he feels can still communicate with him in a meaningful way.  His plan was to visit with the patient on 11/9 and once again discussed dialysis and other medical therapies with her and plans to move to hospice care and stop dialysis.  He verbalized to Dr. Johnney Ou that he understand that ultimately this decision may follow to him since he is the power of attorney for healthcare issues.  Nephrology plans to reconvene on 11/10 to further discuss this topic.  Neck pain Seems better on Robaxin and Toradol  Type 2 diabetes mellitus Episodes of hypoglycemia Hemoglobin  A1c 5.1.  Follow CBGs and provide SSI as indicated Not eating enough for long-acting insulin Continue Neurontin for peripheral neuropathy  Stage II mid coccyx pressure injury, present on admission Continue local wound care-given poor nutrition and is doubtful this wound will ever heal and likely will only get worse due to her concomitant lack of mobility        Data Reviewed:  CBC: Recent Labs  Lab 08/28/21 0951 09/01/21 0626  WBC 19.3* 7.6  HGB 8.5* 8.0*  HCT 28.6* 26.6*  MCV 89.7  86.6  PLT 385 413*     CBG: Recent Labs  Lab 09/02/21 0645 09/02/21 1127 09/02/21 1633 09/02/21 2132 09/03/21 0703  GLUCAP 136* 145* 108* 96 128*     Scheduled Meds:  apixaban  5 mg Oral BID   Chlorhexidine Gluconate Cloth  6 each Topical Q0600   darbepoetin (ARANESP) injection - DIALYSIS  200 mcg Intravenous Q Mon-HD   feeding supplement  237 mL Oral TID BM   gabapentin  100 mg Oral Q12H   mirtazapine  7.5 mg Oral QHS   OLANZapine zydis  2.5 mg Oral QHS   predniSONE  5 mg Oral Q breakfast   sodium chloride flush  10-40 mL Intracatheter Q12H   venlafaxine  37.5 mg Oral BID     Active Problems:   Essential hypertension   Type 2 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, with long-term current use of insulin (HCC)   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   ESRD on dialysis ([place])   Dyslipidemia   Stage 2 skin ulcer of sacral region ([place])   Cerebral thrombosis with cerebral infarction   Protein-calorie malnutrition, severe   Acute GI bleeding   Failure to thrive in adult   Rectal bleeding   Delirium due to another medical condition   Neurogenic dysphagia   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

## 2021-09-03 NOTE — TOC Progression Note (Signed)
Transition of Care [facility]) - Progression Note    Patient Details  Name: LATRONDA SPINK MRN: [id number] Date of Birth: 24-Nov-1945  [facility]) CM/SW [place], RN Phone Number: 09/03/2021, 11:29 AM  Clinical Narrative:    CM and MSW with DTP continue to follow the patient for [facility] needs regarding patient's care.  The patient's son plans to visit with the patient and CM will follow up accordingly regarding family's decision.   Expected Discharge Plan: [place] Barriers to Discharge: Continued Medical Work up  Expected Discharge Plan and Services Expected Discharge Plan: [place] In-house Referral: Clinical Social Work     Living arrangements for the past 2 months: [place] (Patient came to hospital from [place])                                       Social Determinants of Health (SDOH) Interventions    Readmission Risk Interventions Readmission Risk Prevention Plan 08/04/2021  Transportation Screening Complete  Medication Review [employer]) Complete  PCP or Specialist appointment within 3-5 days of discharge Complete  HRI or Home Care Consult Complete  SW Recovery Care/Counseling Consult Complete  Palliative Care Screening Complete  Skilled Nursing Facility Complete  Some recent data might be hidden

## 2021-09-04 DIAGNOSIS — I639 Cerebral infarction, unspecified: Secondary | ICD-10-CM | POA: Diagnosis not present

## 2021-09-04 DIAGNOSIS — K625 Hemorrhage of anus and rectum: Secondary | ICD-10-CM | POA: Diagnosis not present

## 2021-09-04 DIAGNOSIS — N186 End stage renal disease: Secondary | ICD-10-CM | POA: Diagnosis not present

## 2021-09-04 DIAGNOSIS — R627 Adult failure to thrive: Secondary | ICD-10-CM | POA: Diagnosis not present

## 2021-09-04 LAB — RENAL FUNCTION PANEL
Albumin: <1.5 g/dL — ABNORMAL LOW (ref 3.5–5.0)
Anion gap: 9 (ref 5–15)
BUN: 32 mg/dL — ABNORMAL HIGH (ref 8–23)
CO2: 30 mmol/L (ref 22–32)
Calcium: 7.9 mg/dL — ABNORMAL LOW (ref 8.9–10.3)
Chloride: 99 mmol/L (ref 98–111)
Creatinine, Ser: 4.54 mg/dL — ABNORMAL HIGH (ref 0.44–1.00)
GFR, Estimated: 10 mL/min — ABNORMAL LOW (ref >60)
Glucose, Bld: 152 mg/dL — ABNORMAL HIGH (ref 70–99)
Phosphorus: 2.3 mg/dL — ABNORMAL LOW (ref 2.5–4.6)
Potassium: 4.2 mmol/L (ref 3.5–5.1)
Sodium: 138 mmol/L (ref 135–145)

## 2021-09-04 LAB — CBC
HCT: 24.1 % — ABNORMAL LOW (ref 36.0–46.0)
Hemoglobin: 7.1 g/dL — ABNORMAL LOW (ref 12.0–15.0)
MCH: 25.4 pg — ABNORMAL LOW (ref 26.0–34.0)
MCHC: 29.5 g/dL — ABNORMAL LOW (ref 30.0–36.0)
MCV: 86.1 fL (ref 80.0–100.0)
Platelets: 453 10*3/uL — ABNORMAL HIGH (ref 150–400)
RBC: 2.8 MIL/uL — ABNORMAL LOW (ref 3.87–5.11)
RDW: 19.1 % — ABNORMAL HIGH (ref 11.5–15.5)
WBC: 16.8 10*3/uL — ABNORMAL HIGH (ref 4.0–10.5)
nRBC: 0 % (ref 0.0–0.2)

## 2021-09-04 LAB — GLUCOSE, CAPILLARY
Glucose-Capillary: 189 mg/dL — ABNORMAL HIGH (ref 70–99)
Glucose-Capillary: 141 mg/dL — ABNORMAL HIGH (ref 70–99)
Glucose-Capillary: 143 mg/dL — ABNORMAL HIGH (ref 70–99)

## 2021-09-04 LAB — OCCULT BLOOD X 1 CARD TO LAB, STOOL: Fecal Occult Bld: POSITIVE — AB

## 2021-09-04 MED ORDER — SODIUM CHLORIDE 0.9 % IV SOLN: 100.0000 mL | INTRAVENOUS | Status: DC | PRN

## 2021-09-04 MED ORDER — HEPARIN SODIUM (PORCINE) 1000 UNIT/ML DIALYSIS: 1000.0000 [IU] | INTRAMUSCULAR | Status: DC | PRN

## 2021-09-04 MED ORDER — ALTEPLASE 2 MG IJ SOLR: 2.0000 mg | Freq: Once | INTRAMUSCULAR | Status: DC | PRN

## 2021-09-04 MED ORDER — OXYCODONE HCL 5 MG PO TABS
2.5000 mg | ORAL_TABLET | Freq: Once | ORAL | Status: AC
  Administered 2021-09-04: 2.5 mg via ORAL
  Filled 2021-09-04: qty 1

## 2021-09-04 NOTE — Progress Notes (Signed)
   09/04/21 0030  Notify: Provider  Provider Name/Title Dr. Bridgett Larsson  Date Provider Notified 09/04/21  Time Provider Notified 0030  Notification Type Page  Notification Reason Other (Comment) (loose brick red BM)  Provider response See new orders (occult stool sample)  Date of Provider Response 09/04/21  Time of Provider Response 0030

## 2021-09-04 NOTE — TOC Progression Note (Signed)
Transition of Care [facility]) - Progression Note    Patient Details  Name: [name] MRN: [id number] Date of Birth: January 17, 1946  Transition of Care [facility]) CM/SW [place], RN Phone Number: 09/04/2021, 10:48 AM  Clinical Narrative:    CM met with the patient at the bedside along with Erin Hearing, NP.   CM and MSW with DTP Team will continue to follow the patient for TOC needs.  The patient is unable to sit for dialysis sessions and is now having brick red stools - nursing has sent stool to lab for evaluation at this time.  Nephrology has not spoken with the patient's son today in regards to comfort care measures and/or discontinuation of dialysis at this time.  The patient's family recently declined Inpatient hospice placement.  The ethic committee is being consulted per physician notes and the DTP Team will continue to follow for supportive measure at this time.   Expected Discharge Plan: [place] Barriers to Discharge: Continued Medical Work up  Expected Discharge Plan and Services Expected Discharge Plan: [place] In-house Referral: Clinical Social Work     Living arrangements for the past 2 months: [place] (Patient came to hospital from [place])                                       Social Determinants of Health (SDOH) Interventions    Readmission Risk Interventions Readmission Risk Prevention Plan 08/04/2021  Transportation Screening Complete  Medication Review [employer]) Complete  PCP or Specialist appointment within 3-5 days of discharge Complete  HRI or Home Care Consult Complete  SW Recovery Care/Counseling Consult Complete  Palliative Care Screening Complete  Skilled Nursing Facility Complete  Some recent data might be hidden

## 2021-09-04 NOTE — Progress Notes (Addendum)
[facility] PROGRESS NOTE  Amy [name] HKV:[id number] DOB: December 31, 1945 DOA: 05/19/2021 PCP: [name], MD                                        10/28 trying to smile during hemodialysis treatment difficult due to positioning in bed   Status:  Remains inpatient appropriate because: Unsafe d/c plan, IV treatments appropriate due to intensity of illness or inability to take PO, and Inpatient level of care appropriate due to severity of illness patient continues to require dialysis as requested by family and until she is able to transition to the standards of outpatient dialysis she must remain in the hospital.  Barriers to discharge:  Patient needs to be able to tolerate transport to an outpatient dialysis center.  She needs to demonstrate tolerance to sitting in recliner for 3-1/2 to 4 hours and thus far has been unsuccessful in meeting this requirement   Level of care:  Med-Surg  Code Status: DNR Family communication: 11/04 updated son and goddaughter by phone DVT prophylaxis: Eliquis COVID vaccination status: [name] 02/26/2020, Pfizer 04/10/2021    HPI: 75 y.o. 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 Patient presented to the ED on 7/25 with dark stool.  On presentation, hemoglobin was 9.6; GI was consulted.  She underwent EGD which was normal and flexible sigmoidoscopy which showed nonbleeding distal rectal ulcers/stercoral ulcers.  Subsequently, GI signed off.  Neurology was also consulted for altered mental status; MRI brain was similar to prior MRI at [facility] with a large right MCA stroke.  EEG this admission unremarkable. PEG tube was desired by family  but could not be placed because of overlying transverse colon.  General surgery was consulted for G-tube placement. Patient deemed high risk for open placement of gastrostomy tube.  Subsequently she has suffered another stroke which precludes her from any surgical procedures including open G-tube placement.  This was discussed with her son who was in agreement.  Unfortunately he was more conflicted about stopping dialysis noting prior to the stroke and subsequently since that time each time he is asked his mother regarding stopping dialysis she has told him that she does not want to die and wants to continue dialysis.  Consideration given to transitioning to residential hospice once family in agreement about how to proceed with end-of-life issues.  Patient did have an episode of decline immediately following dialysis on 10/20 but subsequently recovered.  She continues not to eat and intermittently takes medications by mouth.  At this point since patient remains relatively stable despite not eating plan is to determine if she is able to tolerate dialysis in a chair which is a requirement for outpatient hemodialysis.  Once this is accomplished we will need to pursue discharge disposition either to home with family caring for patient or to SNF with family paying out-of-pocket for care.   Subjective: Patient awake and when spoken to and briefly interacted but by answering some simple questions but quickly back to sleep.  I asked her if her son Amy [name] had come by to visit yesterday but she did not open her eyes or respond to the question  that was asked.  Objective: Vitals:   09/04/21 0128 09/04/21 0527  BP: (!) 153/72 (!) 117/44  Pulse: (!) 126 98  Resp: 18 18  Temp: 100.1 F (37.8 C) 97.8 F (36.6 C)  SpO2: 100% 100%    Intake/Output Summary (Last 24 hours) at 09/04/2021 0739 Last data filed at 09/04/2021 0200 Gross per 24 hour  Intake 337 ml  Output 0 ml  Net 337 ml    Filed Weights    09/01/21 0746 09/01/21 1202 09/02/21 2130  Weight: 89.8 kg 89.4 kg 90.3 kg    Exam:  Constitutional: Remains lethargic -no acute distress Respiratory: Bilateral anterior lung sounds are clear to auscultation with no increased work of breathing, she is currently on room air with O2 sats 99% Cardiovascular: S1-S2, pulse regular and tachycardic at times, stable bilateral UEs edema Abdomen: LBM 11/09, abdomen soft nontender nondistended.  Bowel sounds + Neurologic: CN 2-12 grossly intact upon visual inspection-sensation remains intact, very limited spontaneous movement.  Strength 1-2/5 -continues to require 2+ assist and full lift into chair utilizing lifting device. Psychiatric: Lethargic and drowsy.  Oriented to person only  Assessment/Plan: Acute problems: Sepsis physiology/RLL HCAP/Abn blood cxs c/w contaminant Completed 7 days of Maxipime  Failure to thrive with generalized deconditioning in context of neurogenic dysphagia Given acute stroke patient is no longer a candidate for open G-tube placement.  Her son agrees  Oral intake remains marginal-120 cc of fluid ingested on 11/08 Follow-up swallowing evaluation revealed no evidence of mechanical dysphagia Oral mucous membranes are extremely dry  Acute on chronic lower GI bleeding from stercoral ulcers/h/o proctocolitis May 2022 Stable without recurrence of bleeding but on 11/10 pt had red colored stools concerning for blood-FOBT pending. If positive will dc Eliquis EGD in July (this admit) neg but flex sig revealed non bleeding stercoral ulcer Hemoglobin today 7.1 and has drifted down from a peak of 9.0 with previous reading 3 days ago 8.0-repeat CBC in a.m. 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  Given recent and recurrent stroke she is not a candidate for operative placement/open G-tube  Suspected dementia/recent delirium Continue Remeron and Effexor  ESRD on HD Continues with hemodialysis but only on 2 days a week.  Has not demonstrated ability to sit up in chair to tolerate outpatient hemodialysis noting she has tolerated 2.5-3.5 hours up in chair in room but has not yet attempted up in chair during dialysis sessions.  She will also need to tolerate transport back and forth from home/SNF to dialysis facility at a minimum of twice weekly 11/8 ethics committee consulted for input 11/8 nephrology attempted to contact son but no answer.  Subsequently nephrologist was able to speak with son Amy [name] on 11/9.  Recommendation was offered to discontinue dialysis noting Amy [name] was receptive to discontinuation of dialysis but continues to defer this decision to his mother who he feels can still communicate with him in a meaningful way.  His plan was to visit with the patient on 11/9  and once again discussed dialysis and other medical therapies with her and plans to move to hospice care and stop dialysis.  He verbalized to Dr. Johnney Ou that he understand that ultimately this decision may follow to him since he is the power of attorney for healthcare issues.  Nephrology plans to reconvene on 11/10 to further discuss this topic. -11/10 after Johnney Ou documents that son had indicated to her that mornings were the best time to reach him.  She attempted to call him to follow-up on the conversation the  previous day regarding the recommendation to discontinue dialysis and proceed with comfort measures.  The son did not answer the phone  Neck pain IV Toradol order expired 11/02 and she only received one dose-now on LD Prednisone taper as of 11/2 Continue IV Robaxin prn  Type 2 diabetes mellitus Episodes of hypoglycemia Hemoglobin A1c 5.1.  Follow CBGs and provide SSI as indicated Not eating enough for long-acting insulin Continue Neurontin for peripheral neuropathy  Stage II mid coccyx pressure injury, present on admission Continue local wound care-given poor nutrition and is doubtful this wound will ever heal and likely will only get worse due to her concomitant lack of mobility        Data Reviewed:  CBC: Recent Labs  Lab 08/28/21 0951 09/01/21 0626  WBC 19.3* 7.6  HGB 8.5* 8.0*  HCT 28.6* 26.6*  MCV 89.7 86.6  PLT 385 413*     CBG: Recent Labs  Lab 09/03/21 0703 09/03/21 1138 09/03/21 1652 09/03/21 2056 09/04/21 0636  GLUCAP 128* 120* 118* 101* 189*     Scheduled Meds:  (feeding supplement) PROSource Plus  30 mL Oral BID BM   apixaban  5 mg Oral BID   Chlorhexidine Gluconate Cloth  6 each Topical Q0600   darbepoetin (ARANESP) injection - DIALYSIS  200 mcg Intravenous Q Mon-HD   feeding supplement  237 mL Oral TID BM   gabapentin  100 mg Oral Q12H   mirtazapine  7.5 mg Oral QHS   multivitamin  1 tablet Oral QHS   OLANZapine zydis  2.5 mg Oral QHS   potassium & sodium phosphates  1 packet Oral QHS   predniSONE  5 mg Oral Q breakfast   sodium chloride flush  10-40 mL Intracatheter Q12H   venlafaxine  37.5 mg Oral BID     Active Problems:   Essential hypertension   Type 2 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, with long-term current use of insulin (HCC)   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   ESRD on dialysis ([place])   Dyslipidemia   Stage 2 skin ulcer of sacral region ([place])   Cerebral thrombosis with  cerebral infarction   Protein-calorie malnutrition, severe   Acute GI bleeding   Failure to thrive in adult   Rectal bleeding   Delirium due to another medical condition   Neurogenic dysphagia   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 ([place])   HCAP (healthcare-associated pneumonia)   Sepsis due to other etiology ([place])   Recurrent cerebrovascular accidents (CVAs) ([place])   End of life care   Consultants: Vascular surgery Nephrology Gastroenterology Neurology Palliative medicine General surgery Ethics committee  Procedures: EEG Echocardiogram [place] EGD  Antibiotics: Ceftriaxone 8/3 through 8/5   Time spent: 15 minutes    Erin Hearing ANP  [facility] 7 am - 330 pm/M-F for direct patient care and secure chat Please refer to Amion for contact info 107  days

## 2021-09-04 NOTE — Progress Notes (Addendum)
[facility] Progress Note   Subjective: Seen on HD in bed. HD was not ordered to be done in recliner. Believe this is creating more stress for staff and patient. She is awake, doing verbal litany of "Help me, help me, help me".   Objective Vitals:   09/03/21 2055 09/04/21 0128 09/04/21 0527 09/04/21 0920  BP: 131/78 (!) 153/72 (!) 117/44 (!) 128/48  Pulse: (!) 118 (!) 126 98 98  Resp: 18 18 18 16   Temp: 99.3 F (37.4 C) 100.1 F (37.8 C) 97.8 F (36.6 C) 98 F (36.7 C)  TempSrc:  Axillary Rectal Axillary  SpO2: 100% 100% 100% 98%  Weight:      Height:       Physical Exam General: Chronically ill appearing female in NAD Heart: S1,S2 RRR No M/R/G Lungs: CTAB No WOB Abdomen: NABS, NT Extremities:No LE edema,  Dialysis Access: LIJ TDC Drsg intact Neuro: oriented to self    Additional Objective Labs: Basic Metabolic Panel: Recent Labs  Lab 09/01/21 0800 09/03/21 0439  NA 138 135  K 5.1 4.4  CL 100 98  CO2 29 24  GLUCOSE 135* 149*  BUN 42* 26*  CREATININE 5.12* 3.88*  CALCIUM 8.4* 8.0*  PHOS 1.9* 1.9*   Liver Function Tests: Recent Labs  Lab 09/01/21 0800 09/03/21 0439  ALBUMIN 1.6* <1.5*   No results for input(s): LIPASE, AMYLASE in the last 168 hours. CBC: Recent Labs  Lab 09/01/21 0626  WBC 7.6  HGB 8.0*  HCT 26.6*  MCV 86.6  PLT 413*   Blood Culture    Component Value Date/Time   SDES BLOOD RIGHT HAND 08/21/2021 0851   SDES BLOOD LEFT HAND 08/21/2021 0851   SPECREQUEST  08/21/2021 0851    BOTTLES DRAWN AEROBIC AND ANAEROBIC Blood Culture adequate volume   SPECREQUEST  08/21/2021 0851    BOTTLES DRAWN AEROBIC AND ANAEROBIC Blood Culture adequate volume   CULT  08/21/2021 0851    NO GROWTH 5 DAYS Performed at [facility], [place] [address] [place], [place] [address]    CULT  08/21/2021 [phone]    NO GROWTH 5 DAYS Performed at [facility], [place] [address] [place], [place] [address]    REPTSTATUS 08/26/2021 FINAL  08/21/2021 0851   REPTSTATUS 08/26/2021 FINAL 08/21/2021 0851    Cardiac Enzymes: No results for input(s): CKTOTAL, CKMB, CKMBINDEX, TROPONINI in the last 168 hours. CBG: Recent Labs  Lab 09/03/21 0703 09/03/21 1138 09/03/21 1652 09/03/21 2056 09/04/21 0636  GLUCAP 128* 120* 118* 101* 189*   Iron Studies: No results for input(s): IRON, TIBC, TRANSFERRIN, FERRITIN in the last 72 hours. @lablastinr3 @ Studies/Results: No results found. Medications:  sodium chloride     sodium chloride     methocarbamol (ROBAXIN) IV      (feeding supplement) PROSource Plus  30 mL Oral BID BM   apixaban  5 mg Oral BID   Chlorhexidine Gluconate Cloth  6 each Topical Q0600   darbepoetin (ARANESP) injection - DIALYSIS  200 mcg Intravenous Q Mon-HD   feeding supplement  237 mL Oral TID BM   gabapentin  100 mg Oral Q12H   mirtazapine  7.5 mg Oral QHS   multivitamin  1 tablet Oral QHS   OLANZapine zydis  2.5 mg Oral QHS   potassium & sodium phosphates  1 packet Oral QHS   predniSONE  5 mg Oral Q breakfast   sodium chloride flush  10-40 mL Intracatheter Q12H   venlafaxine  37.5  mg Oral BID     Dialysis Orders: AF MWF  3h 69min 400/500 62.5kg 3K/2.5 Ca P2 AVG HeRO    --No Heparin - Mircera 50 mcg IV q 2 wks  (7/13)   --Venofer 50mg  IV  q wk - Parsabiv 5mg  IV q HD - not on [facility] formulary   Assessment/Plan: AMS/FTT: Mental status waxes/wanes.  Family had decided on comfort measures and has now decided they would like her to continue HD. PT evaluating.  Anemia/Rectal bleeding - GI consulted. Hx rectal ulcers.  EGD 7/27 normal, flex sig with mucosal ulceration c/w sterocoral ulcers, non bleeding. Dropped to 6.5 on 10/10. S/p prbcs on 10/10. Hgb now in the 8-9 range. Continue  max aranesp q Friday.  ESRD: HD was stopped 08/04/2021. Plan was for comfort measures only. However family rescinded plan. Patient resumed HD 08/14/2021 per family wishes. Started  biweekly HD on Monday and Thursday,  Next will  be 09/08/21.  Hyperkalemia: K+ in 5 range on bi weekly HD. Had been on KPhos for hypophosphatemia.  C  K+ 4.4 09/03/2021. Resolved.  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 AVG clotted 06/27/21, unable to be declotted d/t arm contracture. [place] placed 07/02/21.  Hypertension/volume:  Appears dry. Run even in HD tomorrow.  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  C Ca 9.84 PO4 1.9. resumed  oral K+Phos.  Nutrition: Alb very low <1.5. Not a candidate for feeding tube. Poor PO intake. Protein supps ordered.  Recent CVA with no evidence of meaningful recovery Leukocytosis -- RLL HCAP, BC with stap epi. Rec'd Maxipime for one week. Per primary.  Psych eval from 9/14 determined that patient has functional mental capacity for medical decision-making. Psych re-consulted 9/23 to evaluate the patient again - at this point, patient does not have the capacity to make medical decisions. Ongoing discussions with patient's son continues Dispo: DNR/DNI. Poor prognosis. Family was interested in hospice and patient was on comfort measures, family has now changed their mind and has not been very responsive to palliative care. Patient will need to be strong enough to tolerate dialysis in a recliner before she can be discharged to outpatient but she has made no progress in this area. HD in chair creates extra work and stress for staff and patient. She is working daily with PT and do not believe she will be going to OP clinic any time soon. She can be hoyered into recliner at anytime. Nephrology has been clear that we do not believe hemodialysis is contributing to quality of her life. Ethics now consulted.   Carolos Fecher H. Dilraj Killgore NP-C 09/04/2021, 9:43 AM  [employer] [phone]

## 2021-09-04 NOTE — Progress Notes (Signed)
OT Cancellation Note  Patient Details Name: Amy [name] MRN: [id number] DOB: [birth year]   Cancelled Treatment:    Reason Eval/Treat Not Completed: Patient at procedure or test/ unavailable  Joeseph Amor OTR/L  Acute Rehab Services  [phone] office number [phone] pager number   Joeseph Amor 09/04/2021, 10:49 AM

## 2021-09-04 NOTE — Progress Notes (Signed)
Patient drank one ensure with medications.

## 2021-09-04 NOTE — Progress Notes (Signed)
X-cover. Bedside RN reported red colored stools. Pt on Eliquis. Stool sent for hemoccult.

## 2021-09-05 DIAGNOSIS — R627 Adult failure to thrive: Secondary | ICD-10-CM | POA: Diagnosis not present

## 2021-09-05 DIAGNOSIS — E43 Unspecified severe protein-calorie malnutrition: Secondary | ICD-10-CM | POA: Diagnosis not present

## 2021-09-05 DIAGNOSIS — N186 End stage renal disease: Secondary | ICD-10-CM | POA: Diagnosis not present

## 2021-09-05 DIAGNOSIS — R1319 Other dysphagia: Secondary | ICD-10-CM | POA: Diagnosis not present

## 2021-09-05 LAB — CBC WITH DIFFERENTIAL/PLATELET
Abs Immature Granulocytes: 0.1 10*3/uL — ABNORMAL HIGH (ref 0.00–0.07)
Basophils Absolute: 0.1 10*3/uL (ref 0.0–0.1)
Basophils Relative: 0 %
Eosinophils Absolute: 0.1 10*3/uL (ref 0.0–0.5)
Eosinophils Relative: 1 %
HCT: 29.6 % — ABNORMAL LOW (ref 36.0–46.0)
Hemoglobin: 9.2 g/dL — ABNORMAL LOW (ref 12.0–15.0)
Immature Granulocytes: 1 %
Lymphocytes Relative: 8 %
Lymphs Abs: 1.1 10*3/uL (ref 0.7–4.0)
MCH: 26.2 pg (ref 26.0–34.0)
MCHC: 31.1 g/dL (ref 30.0–36.0)
MCV: 84.3 fL (ref 80.0–100.0)
Monocytes Absolute: 1.1 10*3/uL — ABNORMAL HIGH (ref 0.1–1.0)
Monocytes Relative: 9 %
Neutro Abs: 10.6 10*3/uL — ABNORMAL HIGH (ref 1.7–7.7)
Neutrophils Relative %: 81 %
Platelets: 476 10*3/uL — ABNORMAL HIGH (ref 150–400)
RBC: 3.51 MIL/uL — ABNORMAL LOW (ref 3.87–5.11)
RDW: 19.2 % — ABNORMAL HIGH (ref 11.5–15.5)
WBC: 13 10*3/uL — ABNORMAL HIGH (ref 4.0–10.5)
nRBC: 0.2 % (ref 0.0–0.2)

## 2021-09-05 LAB — GLUCOSE, CAPILLARY
Glucose-Capillary: 211 mg/dL — ABNORMAL HIGH (ref 70–99)
Glucose-Capillary: 261 mg/dL — ABNORMAL HIGH (ref 70–99)
Glucose-Capillary: 206 mg/dL — ABNORMAL HIGH (ref 70–99)
Glucose-Capillary: 142 mg/dL — ABNORMAL HIGH (ref 70–99)

## 2021-09-05 NOTE — Progress Notes (Signed)
[facility] Progress Note   Subjective:   Pt seen in room. Speech tangential today but asks me to hold her hand and tells me she needs a shower. Denies SOB, CP, palpitations, dizziness.   Objective Vitals:   09/04/21 1643 09/04/21 2107 09/05/21 0535 09/05/21 0835  BP: (!) 136/49 129/73 123/62 126/75  Pulse: (!) 108 96 (!) 110 (!) 115  Resp: 16 20 17 17   Temp: 98 F (36.7 C) 97.8 F (36.6 C) 98.4 F (36.9 C) 99 F (37.2 C)  TempSrc:  Oral Oral Axillary  SpO2: 100% 100% 100% 100%  Weight:      Height:       Physical Exam General: Chronically ill appearing female, initially anxious but calms down during exam Heart: slightly tachycardic, regular rhythm, no murmur Lungs: CTA anteriorly Abdomen: Soft, non-tender, non-distended, +BS Extremities: No edema b/l lower extremities Dialysis Access:  L IJ TDC with dry dressing  Additional Objective Labs: Basic Metabolic Panel: Recent Labs  Lab 09/01/21 0800 09/03/21 0439 09/04/21 1000  NA 138 135 138  K 5.1 4.4 4.2  CL 100 98 99  CO2 29 24 30   GLUCOSE 135* 149* 152*  BUN 42* 26* 32*  CREATININE 5.12* 3.88* 4.54*  CALCIUM 8.4* 8.0* 7.9*  PHOS 1.9* 1.9* 2.3*   Liver Function Tests: Recent Labs  Lab 09/01/21 0800 09/03/21 0439 09/04/21 1000  ALBUMIN 1.6* <1.5* <1.5*   No results for input(s): LIPASE, AMYLASE in the last 168 hours. CBC: Recent Labs  Lab 09/01/21 0626 09/04/21 1000  WBC 7.6 16.8*  HGB 8.0* 7.1*  HCT 26.6* 24.1*  MCV 86.6 86.1  PLT 413* 453*   Blood Culture    Component Value Date/Time   SDES BLOOD RIGHT HAND 08/21/2021 0851   SDES BLOOD LEFT HAND 08/21/2021 0851   SPECREQUEST  08/21/2021 0851    BOTTLES DRAWN AEROBIC AND ANAEROBIC Blood Culture adequate volume   SPECREQUEST  08/21/2021 0851    BOTTLES DRAWN AEROBIC AND ANAEROBIC Blood Culture adequate volume   CULT  08/21/2021 0851    NO GROWTH 5 DAYS Performed at [facility], [place] [address] [place], [place]  [address]    CULT  08/21/2021 [phone]    NO GROWTH 5 DAYS Performed at [facility], [place] [address], [place], [place] [address]    REPTSTATUS 08/26/2021 FINAL 08/21/2021 0851   REPTSTATUS 08/26/2021 FINAL 08/21/2021 0851    Cardiac Enzymes: No results for input(s): CKTOTAL, CKMB, CKMBINDEX, TROPONINI in the last 168 hours. CBG: Recent Labs  Lab 09/03/21 2056 09/04/21 0636 09/04/21 1644 09/04/21 2103 09/05/21 0639  GLUCAP 101* 189* 141* 143* 211*   Iron Studies: No results for input(s): IRON, TIBC, TRANSFERRIN, FERRITIN in the last 72 hours. @lablastinr3 @ Studies/Results: No results found. Medications:  methocarbamol (ROBAXIN) IV      (feeding supplement) PROSource Plus  30 mL Oral BID BM   Chlorhexidine Gluconate Cloth  6 each Topical Q0600   darbepoetin (ARANESP) injection - DIALYSIS  200 mcg Intravenous Q Mon-HD   feeding supplement  237 mL Oral TID BM   gabapentin  100 mg Oral Q12H   mirtazapine  7.5 mg Oral QHS   multivitamin  1 tablet Oral QHS   OLANZapine zydis  2.5 mg Oral QHS   potassium & sodium phosphates  1 packet Oral QHS   sodium chloride flush  10-40 mL Intracatheter Q12H   venlafaxine  37.5 mg Oral BID    Dialysis Orders: AF MWF  3h 49min 400/500 62.5kg 3K/2.5 Ca P2 AVG HeRO    --No Heparin - Mircera 50 mcg IV q 2 wks  (7/13)   --Venofer 50mg  IV  q wk - Parsabiv 5mg  IV q HD - not on [facility] formulary  Assessment/Plan: AMS/FTT: Mental status waxes/wanes.  Family had decided on comfort measures and has now decided they would like her to continue HD. PT evaluating.  Anemia/Rectal bleeding - GI consulted. Hx rectal ulcers.  EGD 7/27 normal, flex sig with mucosal ulceration c/w sterocoral ulcers, non bleeding. Dropped to 6.5 on 10/10. S/p prbcs on 10/10. Hgb now in the 8-9 range. Continue  max aranesp q Monday.  ESRD: HD was stopped 08/04/2021. Plan was for comfort measures only. However family rescinded plan. Patient resumed HD 08/14/2021 per family wishes.  Started  biweekly HD on Monday and Thursday,  Next will be 09/08/21.  Hyperkalemia: K+ in 5 range on bi weekly HD. Had been on KPhos for hypophosphatemia. Resolved.  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 AVG clotted 06/27/21, unable to be declotted d/t arm contracture. [place] placed 07/02/21.  Hypertension/volume:  Appears dry. 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  Albumin very low but correctly calcium likely ok. PO4 1.9. resumed  oral K+Phos and now improved to 2.3. Nutrition: Alb very low <1.5. Not a candidate for feeding tube. Poor PO intake. Protein supps ordered.  Recent CVA: with no evidence of meaningful recovery Dispo: DNR/DNI. Poor prognosis. Psych eval from 9/14 determined that patient has functional mental capacity for medical decision-making. Psych re-consulted 9/23 to evaluate the patient again - at this point, patient does not have the capacity to make medical decisions.  Family was interested in hospice and patient was on comfort measures, family has now changed their mind and has not been very responsive to palliative care. Patient is able to tolerate HD in a recliner if hoyer lifted into the chair, unable to transfer herself. Nephrology does not feel dialysis is significantly improving her quality of life. Dr. Johnney Ou has discussed this with pt's son.   Anice Paganini, PA-C 09/05/2021, 9:25 AM  [facility] Pager: [phone]

## 2021-09-05 NOTE — Progress Notes (Addendum)
[facility] PROGRESS NOTE  Amy [name] CZY:[id number] DOB: 18-Nov-1945 DOA: 05/19/2021 PCP: [name], MD                                        10/28 trying to smile during hemodialysis treatment difficult due to positioning in bed   Status:  Remains inpatient appropriate because: Unsafe d/c plan, IV treatments appropriate due to intensity of illness or inability to take PO, and Inpatient level of care appropriate due to severity of illness patient continues to require dialysis as requested by family and until she is able to transition to the standards of outpatient dialysis she must remain in the hospital.  Barriers to discharge:  Patient needs to be able to tolerate transport to an outpatient dialysis center.  She needs to demonstrate tolerance to sitting in recliner for 3-1/2 to 4 hours and thus far has been unsuccessful in meeting this requirement   Level of care:  Med-Surg  Code Status: DNR Family communication: 11/11 updated son and goddaughter by phone DVT prophylaxis: Eliquis COVID vaccination status: [name] 02/26/2020, [place] 04/10/2021    HPI: 75 y.o. 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 Patient presented to the ED on 7/25 with dark stool.  On presentation, hemoglobin was 9.6; GI was consulted.  She underwent EGD which was normal and flexible sigmoidoscopy which showed nonbleeding distal rectal ulcers/stercoral ulcers.  Subsequently, GI signed off.  Neurology was also consulted for altered mental status; MRI brain was similar to prior MRI at [facility] with a large right MCA stroke.  EEG this admission unremarkable. PEG tube was desired by family  but could not be placed because of overlying transverse colon.  General surgery was consulted for G-tube placement. Patient deemed high risk for open placement of gastrostomy tube.  Subsequently she has suffered another stroke which precludes her from any surgical procedures including open G-tube placement.  This was discussed with her son who was in agreement.  Unfortunately he was more conflicted about stopping dialysis noting prior to the stroke and subsequently since that time each time he is asked his mother regarding stopping dialysis she has told him that she does not want to die and wants to continue dialysis.  Consideration given to transitioning to residential hospice once family in agreement about how to proceed with end-of-life issues.  Patient did have an episode of decline immediately following dialysis on 10/20 but subsequently recovered.  She continues not to eat and intermittently takes medications by mouth.  Unfortunately oral intake is inadequate.  Because of suboptimal blood pressure readings and tachycardia during dialysis dialysis sessions have been decreased to twice weekly.  She is inconsistently tolerating out of bed to chair in her room between 2-1/2 and 3-1/2 hours but has yet been able to tolerate dialysis in a chair.  Nephrologist has initiated discussions with family regarding above issues and recommendation to stop dialysis and pursue hospice.  On 11/10 early a.m. patient was noted with red-colored stools but has not been having frank bleeding.  FOBT positive so prednisone and Eliquis discontinued.    Nephrology has made multiple attempts this week to have discussion  with son about stopping dialysis.  For to note dictated on 11/11.  Nephrology documents that continuing dialysis providing her no appreciable long-term benefit and appears to be negatively affecting her quality of life.  The physician documented that she would like to have had that conversation with the son but again  has not been able to discuss with him as deviously agreed upon this past Wednesday.  Nephrology at this time is not planning to schedule any further dialysis treatments  Subjective: Awakened.  When asked how she felt today she stated "not good".  Denied reflux symptoms or abdominal discomfort.  Denied pain when passing bowel movement.  Objective: Vitals:   09/04/21 2107 09/05/21 0535  BP: 129/73 123/62  Pulse: 96 (!) 110  Resp: 20 17  Temp: 97.8 F (36.6 C) 98.4 F (36.9 C)  SpO2: 100% 100%    Intake/Output Summary (Last 24 hours) at 09/05/2021 0745 Last data filed at 09/05/2021 0600 Gross per 24 hour  Intake 300 ml  Output -100 ml  Net 400 ml    Filed Weights   09/02/21 2130  Weight: 90.3 kg    Exam:  Constitutional: Remains lethargic but able to be awakened-no acute distress Respiratory: Anterior lung sounds CTA, no increased work of breathing, stable on room air. Cardiovascular: S1-S2, pulse regular and tachycardic, stable bilateral UE edema Abdomen: LBM 11/10, abdomen soft nontender nondistended.  Bowel sounds + Neurologic: CN 2-12 grossly intact upon visual inspection-sensation remains intact, very limited spontaneous movement.  Strength 1-2/5 -continues to require 2+ assist and full lift into chair utilizing lifting device. Psychiatric: Lethargic and drowsy.  Oriented to person only  Assessment/Plan: Acute problems: Sepsis physiology/RLL HCAP/Abn blood cxs c/w contaminant Completed 7 days of Maxipime  Failure to thrive with generalized deconditioning in context of neurogenic dysphagia Given acute stroke patient is no longer a candidate for open G-tube placement.  Her son agrees  Oral intake remains marginal-300 cc ingested on 11/10 but continues with 0% of meals in regards to solid intake Follow-up swallowing evaluation revealed no evidence of mechanical dysphagia Oral mucous membranes are extremely dry  Acute on chronic lower GI bleeding from stercoral  ulcers/h/o proctocolitis May 2022 Stable without recurrence of bleeding but on 11/10 pt had red colored stools concerning for blood-FOBT late in the evening on 11/10 was positive.  A.m. 11/11 Eliquis and prednisone discontinued.  Family updated. EGD in July (this admit) neg but flex sig revealed non bleeding stercoral ulcer Hemoglobin 11/10 was 7.1 and has drifted down from a peak of 8.5  -Hemoglobin up to 9.2 on 11/11.  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  Given recent and recurrent stroke she is not a candidate for operative placement/open G-tube  Suspected dementia/recent delirium Continue Remeron and Effexor  ESRD on HD Continues with hemodialysis but only on 2 days a week.  Has not demonstrated ability to sit up in chair to tolerate outpatient hemodialysis noting she  has tolerated 2.5-3.5 hours up in chair in room but has not yet attempted up in chair during dialysis sessions.  She will also need to tolerate transport back and forth from home/SNF to dialysis facility at a minimum of twice weekly 11/8 ethics committee consulted for input 11/8 nephrology attempted to contact son but no answer.  Subsequently nephrologist was able to speak with son [name] on 11/9.   Recommendation was offered to discontinue dialysis noting [name] was receptive to discontinuation of dialysis but continues to defer this decision to his mother who he feels can still communicate with him in a meaningful way.  His plan was to visit with the patient on 11/9 and once again discussed dialysis and other medical therapies with her and plans to move to hospice care and stop dialysis.  He verbalized to Dr. Johnney Ou that he understand that ultimately this decision may follow to him since he is the power of attorney for healthcare issues.  Nephrology plans to reconvene on 11/10 to further discuss this topic. -11/10 after Johnney Ou documents that son had indicated to her that mornings were the best time to reach him.  She attempted to call him to follow-up on the conversation the previous day regarding the recommendation to discontinue dialysis and proceed with comfort measures.  The son did not answer the phone  Neck pain IV Toradol order expired 11/02 and she only received one dose-now on LD Prednisone taper as of 11/2 sent rectal bleeding have discontinued prednisone as of 11/11 Continue IV Robaxin prn  Type 2 diabetes mellitus Episodes of hypoglycemia Hemoglobin A1c 5.1.  Follow CBGs and provide SSI as indicated Not eating enough for long-acting insulin Continue Neurontin for peripheral neuropathy  Stage II mid coccyx pressure injury, present on admission Continue local wound care-given poor nutrition and is doubtful this wound will ever heal and likely will only get worse due to her concomitant lack of mobility        Data Reviewed:  CBC: Recent Labs  Lab 09/01/21 0626 09/04/21 1000  WBC 7.6 16.8*  HGB 8.0* 7.1*  HCT 26.6* 24.1*  MCV 86.6 86.1  PLT 413* 453*     CBG: Recent Labs  Lab 09/03/21 2056 09/04/21 0636 09/04/21 1644 09/04/21 2103 09/05/21 0639  GLUCAP 101* 189* 141* 143* 211*     Scheduled Meds:  (feeding supplement) PROSource Plus  30 mL Oral BID BM    Chlorhexidine Gluconate Cloth  6 each Topical Q0600   darbepoetin (ARANESP) injection - DIALYSIS  200 mcg Intravenous Q Mon-HD   feeding supplement  237 mL Oral TID BM   gabapentin  100 mg Oral Q12H   mirtazapine  7.5 mg Oral QHS   multivitamin  1 tablet Oral QHS   OLANZapine zydis  2.5 mg Oral QHS   potassium & sodium phosphates  1 packet Oral QHS   sodium chloride flush  10-40 mL Intracatheter Q12H   venlafaxine  37.5 mg Oral BID     Active Problems:   Essential hypertension   Type 2 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, with long-term current use of insulin (HCC)   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   ESRD on dialysis ([place])   Dyslipidemia   Stage 2 skin ulcer of sacral region ([place])   Cerebral thrombosis with cerebral infarction   Protein-calorie malnutrition, severe   Acute GI bleeding   Failure to thrive in adult   Rectal bleeding   Delirium due to another  medical condition   Neurogenic dysphagia   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

## 2021-09-05 NOTE — Progress Notes (Signed)
   09/05/21 0835  Assess: MEWS Score  Temp 99 F (37.2 C)  BP 126/75  Pulse Rate (!) 115  Resp 17  Level of Consciousness Alert  SpO2 100 %  O2 Device Room Air  Assess: MEWS Score  MEWS Temp 0  MEWS Systolic 0  MEWS Pulse 2  MEWS RR 0  MEWS LOC 0  MEWS Score 2  MEWS Score Color Yellow  Assess: if the MEWS score is Yellow or Red  Were vital signs taken at a resting state? Yes  Focused Assessment Change from prior assessment (see assessment flowsheet)  Early Detection of Sepsis Score *See Row Information* Medium  MEWS guidelines implemented *See Row Information* Yes  Treat  MEWS Interventions Other (Comment) (notified RN, Lauretta Chester & NP Ebony Hail)  Pain Scale 0-10  Pain Score 0  Take Vital Signs  Increase Vital Sign Frequency  Yellow: Q 2hr X 2 then Q 4hr X 2, if remains yellow, continue Q 4hrs  Escalate  MEWS: Escalate Yellow: discuss with charge nurse/RN and consider discussing with provider and RRT  Notify: Charge Nurse/RN  Name of Charge Nurse/RN Notified Darylene Price  Date Charge Nurse/RN Notified 09/05/21  Time Charge Nurse/RN Notified 0867  Notify: Provider  Provider Name/Title Ebony Hail, NP  Date Provider Notified 09/05/21  Time Provider Notified [phone]  Notification Type Rounds  Notification Reason Other (Comment) (yellow mews HR 115)  Provider response At bedside  Date of Provider Response 09/05/21  Time of Provider Response [phone]  Document  Patient Outcome Other (Comment) (Stable)  Progress note created (see row info) Yes  NP aware, awaiting CBC results. Will continue to monitor.

## 2021-09-06 DIAGNOSIS — D638 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: Secondary | ICD-10-CM | POA: Diagnosis not present

## 2021-09-06 DIAGNOSIS — N186 End stage renal disease: Secondary | ICD-10-CM | POA: Diagnosis not present

## 2021-09-06 DIAGNOSIS — F0280 Dementia in other diseases classified elsewhere without behavioral disturbance: Secondary | ICD-10-CM | POA: Diagnosis not present

## 2021-09-06 DIAGNOSIS — K922 Gastrointestinal hemorrhage, unspecified: Secondary | ICD-10-CM | POA: Diagnosis not present

## 2021-09-06 LAB — GLUCOSE, CAPILLARY
Glucose-Capillary: 123 mg/dL — ABNORMAL HIGH (ref 70–99)
Glucose-Capillary: 150 mg/dL — ABNORMAL HIGH (ref 70–99)
Glucose-Capillary: 190 mg/dL — ABNORMAL HIGH (ref 70–99)
Glucose-Capillary: 161 mg/dL — ABNORMAL HIGH (ref 70–99)

## 2021-09-06 NOTE — Progress Notes (Signed)
Amy [name] Progress Note   Subjective:   Seen in room, noted low grade fever this AM. Pt reports feeling "good," denies any pain at present. No SOB or dizziness. Dialysis had to be stopped early 11/10 due to patient yelling "help me" repeatedly. Dr. Johnney Ou tried to call son multiple times over the past week without success.   Objective Vitals:   09/05/21 1447 09/05/21 1638 09/05/21 2048 09/06/21 0530  BP: (!) 105/53 (!) 103/58 136/75 115/75  Pulse: (!) 106 98 (!) 105 (!) 113  Resp: 16 16 19 19   Temp: 98.1 F (36.7 C) 99 F (37.2 C) 98.4 F (36.9 C) (!) 100.4 F (38 C)  TempSrc: Oral Axillary Oral Oral  SpO2: 100% 100% 100% 98%  Weight:      Height:       Physical Exam General: chronically ill appearing female, alert and in NAD Heart: slightly tachycardic, regular rhythm, no murmur Lungs: CTA anteriorly Abdomen: Soft, non-tender, non-distended, +BS Extremities: No edema b/l lower extremities Dialysis Access:  L IJ TDC with dry, intact dressing  Additional Objective Labs: Basic Metabolic Panel: Recent Labs  Lab 09/01/21 0800 09/03/21 0439 09/04/21 1000  NA 138 135 138  K 5.1 4.4 4.2  CL 100 98 99  CO2 29 24 30   GLUCOSE 135* 149* 152*  BUN 42* 26* 32*  CREATININE 5.12* 3.88* 4.54*  CALCIUM 8.4* 8.0* 7.9*  PHOS 1.9* 1.9* 2.3*   Liver Function Tests: Recent Labs  Lab 09/01/21 0800 09/03/21 0439 09/04/21 1000  ALBUMIN 1.6* <1.5* <1.5*   No results for input(s): LIPASE, AMYLASE in the last 168 hours. CBC: Recent Labs  Lab 09/01/21 0626 09/04/21 1000 09/05/21 0656  WBC 7.6 16.8* 13.0*  NEUTROABS  --   --  10.6*  HGB 8.0* 7.1* 9.2*  HCT 26.6* 24.1* 29.6*  MCV 86.6 86.1 84.3  PLT 413* 453* 476*   Blood Culture    Component Value Date/Time   SDES BLOOD RIGHT HAND 08/21/2021 0851   SDES BLOOD LEFT HAND 08/21/2021 0851   SPECREQUEST  08/21/2021 0851    BOTTLES DRAWN AEROBIC AND ANAEROBIC Blood Culture adequate volume   SPECREQUEST   08/21/2021 0851    BOTTLES DRAWN AEROBIC AND ANAEROBIC Blood Culture adequate volume   CULT  08/21/2021 0851    NO GROWTH 5 DAYS Performed at [facility] [address], [place], [place] [address]    CULT  08/21/2021 [phone]    NO GROWTH 5 DAYS Performed at [facility], [place] [address], [place], [place] [address]    REPTSTATUS 08/26/2021 FINAL 08/21/2021 0851   REPTSTATUS 08/26/2021 FINAL 08/21/2021 0851    Cardiac Enzymes: No results for input(s): CKTOTAL, CKMB, CKMBINDEX, TROPONINI in the last 168 hours. CBG: Recent Labs  Lab 09/05/21 0639 09/05/21 1144 09/05/21 1623 09/05/21 2125 09/06/21 0632  GLUCAP 211* 261* 206* 142* 123*   Iron Studies: No results for input(s): IRON, TIBC, TRANSFERRIN, FERRITIN in the last 72 hours. @lablastinr3 @ Studies/Results: No results found. Medications:  methocarbamol (ROBAXIN) IV      (feeding supplement) PROSource Plus  30 mL Oral BID BM   Chlorhexidine Gluconate Cloth  6 each Topical Q0600   darbepoetin (ARANESP) injection - DIALYSIS  200 mcg Intravenous Q Mon-HD   feeding supplement  237 mL Oral TID BM   gabapentin  100 mg Oral Q12H   mirtazapine  7.5 mg Oral QHS   multivitamin  1 tablet Oral QHS   OLANZapine zydis  2.5  mg Oral QHS   sodium chloride flush  10-40 mL Intracatheter Q12H   venlafaxine  37.5 mg Oral BID    Dialysis Orders: AF MWF  3h 80min 400/500 62.5kg 3K/2.5 Ca P2 AVG HeRO    --No Heparin - Mircera 50 mcg IV q 2 wks  (7/13)   --Venofer 50mg  IV  q wk - Parsabiv 5mg  IV q HD - not on [facility] formulary  Assessment/Plan: AMS/FTT: Mental status waxes/wanes.  Family had decided on comfort measures and has now decided they would like her to continue HD. See below.  Anemia/Rectal bleeding - GI consulted. Hx rectal ulcers.  EGD 7/27 normal, flex sig with mucosal ulceration c/w sterocoral ulcers, non bleeding. Dropped to 6.5 on 10/10. S/p prbcs on 10/10. Hgb now in the 8-9 range. Continue  max aranesp q Monday.   ESRD: HD was stopped 08/04/2021. Plan was for comfort measures only. However family rescinded plan. Patient resumed HD 08/14/2021 per family wishes. Started biweekly HD on Monday and Thursday, however pt is not tolerating HD well and yelled "help me" throughout last HD, resulting in early termination of treatment.  See note from Dr. Johnney Ou on 11/11. She has attempted to discuss discontinuation of dialysis with pt's son multiple times but he has not been answering phone calls. Per Dr. Johnney Ou, dialysis is " It's providing her no appreciable long term benefit and appears may be negatively affecting her QOL.... At this point I do not plan to schedule further dialysis treatments." Hyperkalemia: Resolved Access Issues/AVG clotted: Had f'gram  6/23 /22 which showed chronic thrombus and was started on Eliquis. AVG clotted 06/27/21, unable to be declotted d/t arm contracture. [place] placed 07/02/21.  Hypertension/volume:  Appears dry. Running even in HD. Poor PO intake.  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  Albumin very low but correctly calcium likely ok. PO4 1.9. resumed  oral K+Phos and now improved to 2.3. Nutrition: Alb very low <1.5. Not a candidate for feeding tube. Poor PO intake. Protein supps ordered.  Recent CVA: with no evidence of meaningful recovery Dispo: DNR/DNI. See prior notes. Pt is barely eating and is not a candidate for feeding tube placement. Family made her comfort care, then changed their mind and resumed HD. Nephrology does not feel dialysis is helping her quality of life at this point, see #3 above.     Anice Paganini, PA-C 09/06/2021, 8:47 AM  [name] Pager: [phone]

## 2021-09-06 NOTE — Progress Notes (Signed)
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  Patient presented to ED on 7/25 with dark stool on presentation hemoglobin was 9.6.  GI was consulted.  She underwent EGD which was normal and flexible sigmoidoscopy showed nonbleeding distal rectal ulcer stage stercoral ulcers.  GI signed off. Neurology was consulted for altered mental status, MRI was unremarkable for new findings, EEG was unremarkable.  PEG tube was desired by family but could not be placed due to overlying transverse colon.  General surgery was consulted for G-tube placement.  Patient is high risk for open placement of gastrostomy tube. Subsequently patient suffered another stroke which precludes her from any further surgical procedures including G-tube placement. During last hemodialysis session patient was yelling help me through her dialysis session and was not tolerating hemodialysis.  Nephrology has stopped further hemodialysis. At this time nephrology feels that providing aggressive care with hemodialysis will be futile and will prolong her suffering.   Subjective   Patient seen and examined, denies any complaints.   Assessment/Plan:    ESRD on hemodialysis MWF -Patient not able to tolerate dialysis as per nephrology -Nephrology has stopped further dialysis as pursuing aggressive care is unlikely to add to patient's quality of life -I agree with nephrology that patient is a poor candidate for long-term hemodialysis considering multiple comorbid conditions and  inability to tolerate further hemodialysis sessions -I called and discussed with patient's son, he is going to come to the hospital and discussed with nephrologist Dr. 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 -Patient had recurrent acute on subacute stroke -She was started on Eliquis -However given recent bleeding Eliquis has been discontinued       Scheduled medications:    (feeding supplement) PROSource Plus  30 mL Oral BID BM   Chlorhexidine Gluconate Cloth  6 each Topical Q0600   darbepoetin (ARANESP) injection - DIALYSIS  200 mcg Intravenous Q Mon-HD   feeding supplement  237 mL Oral TID BM   gabapentin  100 mg Oral Q12H   mirtazapine  7.5 mg Oral QHS   multivitamin  1 tablet Oral QHS   OLANZapine zydis  2.5 mg Oral QHS   sodium chloride flush  10-40 mL Intracatheter Q12H   venlafaxine  37.5 mg Oral BID     Data Reviewed:   CBG:  Recent Labs  Lab 09/05/21 1144 09/05/21 1623 09/05/21 2125 09/06/21 0632 09/06/21 1135  GLUCAP 261* 206* 142* 123* 150*    SpO2: 100 % O2 Flow Rate (L/min): 2.5 L/min    Vitals:   09/05/21 1638 09/05/21 2048 09/06/21 0530 09/06/21 0942  BP: (!) 103/58  136/75 115/75 128/72  Pulse: 98 (!) 105 (!) 113 (!) 110  Resp: 16 19 19 18   Temp: 99 F (37.2 C) 98.4 F (36.9 C) (!) 100.4 F (38 C) 98 F (36.7 C)  TempSrc: Axillary Oral Oral   SpO2: 100% 100% 98% 100%  Weight:       Height:         Intake/Output Summary (Last 24 hours) at 09/06/2021 1533 Last data filed at 09/06/2021 1305 Gross per 24 hour  Intake 0 ml  Output 0 ml  Net 0 ml    11/10 1901 - 11/12 0700 In: 480 [P.O.:480] Out: -   Filed Weights   09/02/21 2130  Weight: 90.3 kg    Data Reviewed: Basic Metabolic Panel: Recent Labs  Lab 09/01/21 0800 09/03/21 0439 09/04/21 1000  NA 138 135 138  K 5.1 4.4 4.2  CL 100 98 99  CO2 29 24 30   GLUCOSE 135* 149* 152*  BUN 42* 26* 32*  CREATININE 5.12* 3.88* 4.54*  CALCIUM 8.4* 8.0* 7.9*  PHOS 1.9* 1.9* 2.3*   Liver Function Tests: Recent Labs  Lab 09/01/21 0800 09/03/21 0439 09/04/21 1000  ALBUMIN 1.6* <1.5* <1.5*   No results for input(s): LIPASE, AMYLASE in the last 168 hours. No results for input(s): AMMONIA in the last 168 hours. CBC: Recent Labs  Lab 09/01/21 0626 09/04/21 1000 09/05/21 0656  WBC 7.6 16.8* 13.0*  NEUTROABS  --   --  10.6*  HGB 8.0* 7.1* 9.2*  HCT 26.6* 24.1* 29.6*  MCV 86.6 86.1 84.3  PLT 413* 453* 476*     CBG: Recent Labs  Lab 09/05/21 1144 09/05/21 1623 09/05/21 2125 09/06/21 0632 09/06/21 1135  GLUCAP 261* 206* 142* 123* 150*       Radiology Reports  No results found.     Antibiotics: Anti-infectives (From admission, onward)    Start     Dose/Rate Route Frequency Ordered Stop   07/30/21 1200  vancomycin (VANCOREADY) IVPB 750 mg/150 mL  Status:  Discontinued        750 mg 150 mL/hr over 60 Minutes Intravenous Every M-W-F (Hemodialysis) 07/29/21 1348 08/01/21 1529   07/29/21 1600  ceFEPIme (MAXIPIME) 1 g in sodium chloride 0.9 % 100 mL IVPB        1 g 200 mL/hr over 30 Minutes Intravenous Every 24 hours 07/29/21 1348 08/04/21 2115   07/29/21 1445  vancomycin (VANCOREADY) IVPB 750 mg/150 mL  Status:  Discontinued        750 mg 150 mL/hr over 60 Minutes Intravenous  Once 07/29/21 1348 07/29/21 1353   07/29/21 1445  vancomycin (VANCOREADY) IVPB 1750 mg/350 mL        1,750  mg 175 mL/hr over 120 Minutes Intravenous  Once 07/29/21 1353 07/29/21 1925   07/02/21 1116  ceFAZolin (ANCEF) IVPB 2g/100 mL premix        over 30 Minutes Intravenous Continuous PRN 07/02/21 1139 07/02/21 1116   07/02/21 1111  ceFAZolin (ANCEF) 2-4 GM/100ML-% IVPB       Note to Pharmacy: Domenick Bookbinder   : cabinet override      07/02/21 1111 07/02/21 2314   07/01/21 1215  ceFAZolin (ANCEF) IVPB 2g/100 mL premix        2 g 200 mL/hr over 30 Minutes Intravenous On call 07/01/21 0902 07/02/21 1215   05/29/21 1622  ceFAZolin (ANCEF) 2-4 GM/100ML-% IVPB       Note to Pharmacy: Lytle Butte   : cabinet override  05/29/21 1622 05/30/21 0429   05/29/21 0000  cefTRIAXone (ROCEPHIN) 1 g in sodium chloride 0.9 % 100 mL IVPB  Status:  Discontinued        1 g 200 mL/hr over 30 Minutes Intravenous Every 24 hours 05/28/21 1801 05/28/21 1801   05/28/21 1830  cefTRIAXone (ROCEPHIN) 1 g in sodium chloride 0.9 % 100 mL IVPB        1 g 200 mL/hr over 30 Minutes Intravenous Every 24 hours 05/28/21 1801 05/30/21 1907   05/28/21 1630  ceFAZolin (ANCEF) IVPB 2g/100 mL premix        2 g 200 mL/hr over 30 Minutes Intravenous To Radiology 05/28/21 1537 05/29/21 1630         DVT prophylaxis: SCDs  Code Status: Full code  Family Communication: Discussed with patient's son on phone   Consultants: Nephrology Gastroenterology  Procedures: EEG    Objective    Physical Examination:   General: Appears in no acute distress Cardiovascular: S1-S2, regular, no murmur auscultated Respiratory: Clear to auscultation bilaterally Abdomen: Abdomen is soft, nontender, no organomegaly Extremities: No edema in the lower extremities Neurologic: Alert, oriented to self only, generalized weakness of all extremities   Status is: Inpatient  Dispo: The patient is from: Home              Anticipated d/c is to: To be decided              Anticipated d/c date is: To be decided              Patient  currently not stable for discharge  Barrier to discharge-hemodialysis has been stopped, will need goals of care discussion  COVID-19 Labs  No results for input(s): DDIMER, FERRITIN, LDH, CRP in the last 72 hours.  Lab Results  Component Value Date   SARSCOV2NAA NEGATIVE 08/13/2021   [place] NEGATIVE 07/18/2021   [place] NEGATIVE 05/19/2021   [place] NEGATIVE 03/10/2021     Pressure Injury 05/20/21 Coccyx Mid Stage 2 -  Partial thickness loss of dermis presenting as a shallow open injury with a red, pink wound bed without slough. 1X0.5X0 (Active)  05/20/21 1315  Location: Coccyx  Location Orientation: Mid  Staging: Stage 2 -  Partial thickness loss of dermis presenting as a shallow open injury with a red, pink wound bed without slough.  Wound Description (Comments): 1X0.5X0  Present on Admission: Yes        No results found for this or any previous visit (from the past 240 hour(s)).  Oswald Hillock   [facility] If 7PM-7AM, please contact night-coverage at [email or web address], Office  [phone]   09/06/2021, 3:33 PM  LOS: 109 days

## 2021-09-06 NOTE — Plan of Care (Signed)
  Problem: Health Behavior/Discharge Planning: Goal: Ability to manage health-related needs will improve Outcome: Progressing   Problem: Clinical Measurements: Goal: Ability to maintain clinical measurements within normal limits will improve Outcome: Progressing   Problem: Activity: Goal: Risk for activity intolerance will decrease Outcome: Progressing   Problem: Nutrition: Goal: Adequate nutrition will be maintained Outcome: Progressing   Problem: Skin Integrity: Goal: Risk for impaired skin integrity will decrease Outcome: Progressing   Problem: Education: Goal: Knowledge of the prescribed therapeutic regimen will improve Outcome: Progressing   Problem: Coping: Goal: Ability to identify and develop effective coping behavior will improve Outcome: Progressing   Problem: Clinical Measurements: Goal: Quality of life will improve Outcome: Progressing   Problem: Respiratory: Goal: Verbalizations of increased ease of respirations will increase Outcome: Progressing   Problem: Role Relationship: Goal: Family's ability to cope with current situation will improve Outcome: Progressing Goal: Ability to verbalize concerns, feelings, and thoughts to partner or family member will improve Outcome: Progressing   Problem: Pain Management: Goal: Satisfaction with pain management regimen will improve Outcome: Progressing

## 2021-09-06 NOTE — Progress Notes (Signed)
This RN attempted to give PO meds and Ensure to patient. Pt would bite down on straw and not suck.  Attempted to give med dosed in syringe and pt began to cough after a small amount was given.  Will continue to monitor.  Aurther Loft, RN

## 2021-09-07 DIAGNOSIS — F0280 Dementia in other diseases classified elsewhere without behavioral disturbance: Secondary | ICD-10-CM | POA: Diagnosis not present

## 2021-09-07 DIAGNOSIS — K922 Gastrointestinal hemorrhage, unspecified: Secondary | ICD-10-CM | POA: Diagnosis not present

## 2021-09-07 DIAGNOSIS — D638 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: Secondary | ICD-10-CM | POA: Diagnosis not present

## 2021-09-07 DIAGNOSIS — N186 End stage renal disease: Secondary | ICD-10-CM | POA: Diagnosis not present

## 2021-09-07 LAB — GLUCOSE, CAPILLARY
Glucose-Capillary: 196 mg/dL — ABNORMAL HIGH (ref 70–99)
Glucose-Capillary: 192 mg/dL — ABNORMAL HIGH (ref 70–99)
Glucose-Capillary: 158 mg/dL — ABNORMAL HIGH (ref 70–99)

## 2021-09-07 NOTE — Progress Notes (Signed)
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  Patient presented to ED on 7/25 with dark stool on presentation hemoglobin was 9.6.  GI was consulted.  She underwent EGD which was normal and flexible sigmoidoscopy showed nonbleeding distal rectal ulcer stage stercoral ulcers.  GI signed off. Neurology was consulted for altered mental status, MRI was unremarkable for new findings, EEG was unremarkable.  PEG tube was desired by family but could not be placed due to overlying transverse colon.  General surgery was consulted for G-tube placement.  Patient is high risk for open placement of gastrostomy tube. Subsequently patient suffered another stroke which precludes her from any further surgical procedures including G-tube placement. During last hemodialysis session patient was yelling help me through her dialysis session and was not tolerating hemodialysis.  Nephrology has stopped further hemodialysis. At this time nephrology feels that providing aggressive care with hemodialysis will be futile and will prolong her suffering.   Subjective   Patient seen and examined, confused this morning.   Assessment/Plan:    ESRD on hemodialysis MWF -Patient not able to tolerate dialysis as per nephrology -Nephrology has stopped further dialysis as pursuing aggressive care is unlikely to add to patient's quality of life -I agree with nephrology that patient is a poor candidate for long-term hemodialysis considering multiple comorbid conditions and  inability to tolerate further hemodialysis sessions -I called and discussed with patient's son, explained to him that nephrology has stopped hemodialysis.  He was going to call discussed with Dr. Jonnie Finner.    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 -Patient had recurrent acute on subacute stroke -She was started on Eliquis -However given recent bleeding Eliquis has been discontinued       Scheduled medications:    (feeding supplement) PROSource Plus  30 mL Oral BID BM   Chlorhexidine Gluconate Cloth  6 each Topical Q0600   darbepoetin (ARANESP) injection - DIALYSIS  200 mcg Intravenous Q Mon-HD   feeding supplement  237 mL Oral TID BM   gabapentin  100 mg Oral Q12H   mirtazapine  7.5 mg Oral QHS   multivitamin  1 tablet Oral QHS   OLANZapine zydis  2.5 mg Oral QHS   sodium chloride flush  10-40 mL Intracatheter Q12H   venlafaxine  37.5 mg Oral BID     Data Reviewed:   CBG:  Recent Labs  Lab 09/06/21 1135 09/06/21 1637 09/06/21 2044 09/07/21 0647 09/07/21 1128  GLUCAP 150* 190* 161* 196* 192*    SpO2: 98 % O2 Flow Rate (L/min): 2.5 L/min    Vitals:   09/06/21 1636 09/06/21 2023 09/07/21 0440 09/07/21 2130  BP: (!) 154/72 (!) 168/68 (!) 105/43 (!) 101/51  Pulse: 100 (!) 116 97 98  Resp: 18 18 18 18   Temp: 98 F (36.7 C) 99.6 F (37.6 C) (!) 97.4 F (36.3 C) 98 F (36.7 C)  TempSrc:  Oral Oral Oral  SpO2: 98% 99% 99% 98%   Weight:      Height:         Intake/Output Summary (Last 24 hours) at 09/07/2021 1315 Last data filed at 09/07/2021 0910 Gross per 24 hour  Intake 200 ml  Output 0 ml  Net 200 ml    11/11 1901 - 11/13 0700 In: 200  Out: 0   Filed Weights   09/02/21 2130  Weight: 90.3 kg    Data Reviewed: Basic Metabolic Panel: Recent Labs  Lab 09/01/21 0800 09/03/21 0439 09/04/21 1000  NA 138 135 138  K 5.1 4.4 4.2  CL 100 98 99  CO2 29 24 30   GLUCOSE 135* 149* 152*  BUN 42* 26* 32*  CREATININE 5.12* 3.88* 4.54*  CALCIUM 8.4* 8.0* 7.9*  PHOS 1.9* 1.9* 2.3*   Liver Function Tests: Recent Labs  Lab 09/01/21 0800 09/03/21 0439 09/04/21 1000  ALBUMIN 1.6* <1.5* <1.5*   No results for input(s): LIPASE, AMYLASE in the last 168 hours. No results for input(s): AMMONIA in the last 168 hours. CBC: Recent Labs  Lab 09/01/21 0626 09/04/21 1000 09/05/21 0656  WBC 7.6 16.8* 13.0*  NEUTROABS  --   --  10.6*  HGB 8.0* 7.1* 9.2*  HCT 26.6* 24.1* 29.6*  MCV 86.6 86.1 84.3  PLT 413* 453* 476*     CBG: Recent Labs  Lab 09/06/21 1135 09/06/21 1637 09/06/21 2044 09/07/21 0647 09/07/21 1128  GLUCAP 150* 190* 161* 196* 192*       Radiology Reports  No results found.     Antibiotics: Anti-infectives (From admission, onward)    Start     Dose/Rate Route Frequency Ordered Stop   07/30/21 1200  vancomycin (VANCOREADY) IVPB 750 mg/150 mL  Status:  Discontinued        750 mg 150 mL/hr over 60 Minutes Intravenous Every M-W-F (Hemodialysis) 07/29/21 1348 08/01/21 1529   07/29/21 1600  ceFEPIme (MAXIPIME) 1 g in sodium chloride 0.9 % 100 mL IVPB        1 g 200 mL/hr over 30 Minutes Intravenous Every 24 hours 07/29/21 1348 08/04/21 2115   07/29/21 1445  vancomycin (VANCOREADY) IVPB 750 mg/150 mL  Status:  Discontinued        750 mg 150 mL/hr over 60 Minutes Intravenous  Once 07/29/21 1348 07/29/21 1353   07/29/21 1445  vancomycin (VANCOREADY) IVPB 1750 mg/350 mL         1,750 mg 175 mL/hr over 120 Minutes Intravenous  Once 07/29/21 1353 07/29/21 1925   07/02/21 1116  ceFAZolin (ANCEF) IVPB 2g/100 mL premix        over 30 Minutes Intravenous Continuous PRN 07/02/21 1139 07/02/21 1116   07/02/21 1111  ceFAZolin (ANCEF) 2-4 GM/100ML-% IVPB       Note to Pharmacy: Domenick Bookbinder   : cabinet override      07/02/21 1111 07/02/21 2314   07/01/21 1215  ceFAZolin (ANCEF) IVPB 2g/100 mL premix        2 g 200 mL/hr over 30 Minutes Intravenous On call 07/01/21 0902 07/02/21 1215   05/29/21 1622  ceFAZolin (ANCEF) 2-4 GM/100ML-% IVPB       Note to Pharmacy: Lytle Butte   :  cabinet override      05/29/21 1622 05/30/21 0429   05/29/21 0000  cefTRIAXone (ROCEPHIN) 1 g in sodium chloride 0.9 % 100 mL IVPB  Status:  Discontinued        1 g 200 mL/hr over 30 Minutes Intravenous Every 24 hours 05/28/21 1801 05/28/21 1801   05/28/21 1830  cefTRIAXone (ROCEPHIN) 1 g in sodium chloride 0.9 % 100 mL IVPB        1 g 200 mL/hr over 30 Minutes Intravenous Every 24 hours 05/28/21 1801 05/30/21 1907   05/28/21 1630  ceFAZolin (ANCEF) IVPB 2g/100 mL premix        2 g 200 mL/hr over 30 Minutes Intravenous To Radiology 05/28/21 1537 05/29/21 1630         DVT prophylaxis: SCDs  Code Status: Full code  Family Communication: Discussed with patient's son on phone   Consultants: Nephrology Gastroenterology  Procedures: EEG    Objective    Physical Examination:   General-appears in no acute distress Heart-S1-S2, regular, no murmur auscultated Lungs-clear to auscultation bilaterally, no wheezing or crackles auscultated Abdomen-soft, nontender, no organomegaly Extremities-no edema in the lower extremities Neuro-alert, oriented to self only   Status is: Inpatient  Dispo: The patient is from: Home              Anticipated d/c is to: To be decided              Anticipated d/c date is: To be decided              Patient currently not stable for  discharge  Barrier to discharge-hemodialysis has been stopped, will need goals of care discussion  COVID-19 Labs  No results for input(s): DDIMER, FERRITIN, LDH, CRP in the last 72 hours.  Lab Results  Component Value Date   SARSCOV2NAA NEGATIVE 08/13/2021   [place] NEGATIVE 07/18/2021   [place] NEGATIVE 05/19/2021   [place] NEGATIVE 03/10/2021     Pressure Injury 05/20/21 Coccyx Mid Stage 2 -  Partial thickness loss of dermis presenting as a shallow open injury with a red, pink wound bed without slough. 1X0.5X0 (Active)  05/20/21 1315  Location: Coccyx  Location Orientation: Mid  Staging: Stage 2 -  Partial thickness loss of dermis presenting as a shallow open injury with a red, pink wound bed without slough.  Wound Description (Comments): 1X0.5X0  Present on Admission: Yes        No results found for this or any previous visit (from the past 240 hour(s)).  Oswald Hillock   [facility] If 7PM-7AM, please contact night-coverage at [email or web address], Office  [phone]   09/07/2021, 1:15 PM  LOS: 110 days

## 2021-09-07 NOTE — Plan of Care (Signed)
  Problem: Nutrition: Goal: Adequate nutrition will be maintained Outcome: Not Progressing   

## 2021-09-08 DIAGNOSIS — N186 End stage renal disease: Secondary | ICD-10-CM | POA: Diagnosis not present

## 2021-09-08 DIAGNOSIS — R627 Adult failure to thrive: Secondary | ICD-10-CM | POA: Diagnosis not present

## 2021-09-08 DIAGNOSIS — Z515 Encounter for palliative care: Secondary | ICD-10-CM | POA: Diagnosis not present

## 2021-09-08 DIAGNOSIS — R1319 Other dysphagia: Secondary | ICD-10-CM | POA: Diagnosis not present

## 2021-09-08 LAB — GLUCOSE, CAPILLARY: Glucose-Capillary: 182 mg/dL — ABNORMAL HIGH (ref 70–99)

## 2021-09-08 MED ORDER — LORAZEPAM 2 MG/ML IJ SOLN: 1.0000 mg | INTRAMUSCULAR | Status: DC | PRN

## 2021-09-08 MED ORDER — HALOPERIDOL 0.5 MG PO TABS
0.5000 mg | ORAL_TABLET | ORAL | Status: DC | PRN
  Filled 2021-09-08: qty 1

## 2021-09-08 MED ORDER — HALOPERIDOL LACTATE 5 MG/ML IJ SOLN: 0.5000 mg | INTRAMUSCULAR | Status: DC | PRN

## 2021-09-08 MED ORDER — ONDANSETRON 4 MG PO TBDP: 4.0000 mg | ORAL_TABLET | Freq: Four times a day (QID) | ORAL | Status: DC | PRN

## 2021-09-08 MED ORDER — ONDANSETRON HCL 4 MG/2ML IJ SOLN: 4.0000 mg | Freq: Four times a day (QID) | INTRAMUSCULAR | Status: DC | PRN

## 2021-09-08 MED ORDER — OXYCODONE HCL 20 MG/ML PO CONC: 5.0000 mg | ORAL | Status: DC | PRN

## 2021-09-08 MED ORDER — HALOPERIDOL LACTATE 2 MG/ML PO CONC
0.5000 mg | ORAL | Status: DC | PRN
  Filled 2021-09-08: qty 0.3

## 2021-09-08 MED ORDER — GLYCOPYRROLATE 0.2 MG/ML IJ SOLN: 0.2000 mg | INTRAMUSCULAR | Status: DC | PRN

## 2021-09-08 MED ORDER — LORAZEPAM 2 MG/ML PO CONC: 1.0000 mg | ORAL | Status: DC | PRN

## 2021-09-08 MED ORDER — GLYCOPYRROLATE 1 MG PO TABS
1.0000 mg | ORAL_TABLET | ORAL | Status: DC | PRN
  Filled 2021-09-08: qty 1

## 2021-09-08 MED ORDER — OXYCODONE HCL 20 MG/ML PO CONC
5.0000 mg | ORAL | Status: DC | PRN
  Administered 2021-09-10: 5 mg via ORAL
  Filled 2021-09-08: qty 1

## 2021-09-08 MED ORDER — LORAZEPAM 1 MG PO TABS: 1.0000 mg | ORAL_TABLET | ORAL | Status: DC | PRN

## 2021-09-08 MED ORDER — MORPHINE SULFATE (PF) 2 MG/ML IV SOLN: 1.0000 mg | INTRAVENOUS | Status: DC | PRN

## 2021-09-08 NOTE — Progress Notes (Signed)
[facility] PROGRESS NOTE  Amy [name] OZH:[id number] DOB: 10-Jan-1946 DOA: 05/19/2021 PCP: [name], MD                                        10/28 trying to smile during hemodialysis treatment difficult due to positioning in bed   Status:  Remains inpatient appropriate because: Unsafe d/c plan, IV treatments appropriate due to intensity of illness or inability to take PO, and Inpatient level of care appropriate due to severity of illness patient continues to require dialysis as requested by family and until she is able to transition to the standards of outpatient dialysis she must remain in the hospital.  Barriers to discharge:  Patient needs to be able to tolerate transport to an outpatient dialysis center.  She needs to demonstrate tolerance to sitting in recliner for 3-1/2 to 4 hours and thus far has been unsuccessful in meeting this requirement   Level of care:  Med-Surg  Code Status: DNR Family communication: 11/11 updated son and goddaughter by phone; nephrologist spoke with son at length on 11/12 DVT prophylaxis: Eliquis COVID vaccination status: [name] 02/26/2020, [place] 04/10/2021    HPI: 75 y.o. 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 Patient presented to the ED on 7/25 with dark stool.  On presentation, hemoglobin was 9.6; GI was consulted.  She underwent EGD which was normal and flexible sigmoidoscopy which showed nonbleeding distal rectal ulcers/stercoral ulcers.  Subsequently, GI signed off.  Neurology was also consulted for altered mental status; MRI brain was similar to prior MRI at [facility] with a large right MCA stroke.  EEG this admission  unremarkable. PEG tube was desired by family but could not be placed because of overlying transverse colon.  General surgery was consulted for G-tube placement. Patient deemed high risk for open placement of gastrostomy tube.  Subsequently she has suffered another stroke which precludes her from any surgical procedures including open G-tube placement.  This was discussed with her son who was in agreement.  Unfortunately he was more conflicted about stopping dialysis noting prior to the stroke and subsequently since that time each time he is asked his mother regarding stopping dialysis she has told him that she does not want to die and wants to continue dialysis.  Consideration given to transitioning to residential hospice once family in agreement about how to proceed with end-of-life issues.  Patient did have an episode of decline immediately following dialysis on 10/20 but subsequently recovered.  She continues not to eat and intermittently takes medications by mouth.  Unfortunately oral intake is inadequate.  Because of suboptimal blood pressure readings and tachycardia during dialysis dialysis sessions have been decreased to twice weekly.  She is inconsistently tolerating out of bed to chair in her room between 2-1/2 and 3-1/2 hours but has yet been able to tolerate dialysis in a chair.  Nephrologist has initiated discussions with family regarding above issues and recommendation to stop dialysis and pursue hospice.  On 11/10 early a.m. patient was noted with red-colored stools but has not been having frank bleeding.  FOBT positive so prednisone and Eliquis discontinued.    Nephrology has  documented that continuing dialysis providing her no appreciable long-term benefit and appears to be negatively affecting her quality of life.  Nephrology at this time is not planning to schedule any further dialysis treatments. Nephrology updated son [name] on Saturday 11/12.  Currently the son was not receptive to the  information presented and the rationale for stopping hemodialysis.  He became very angry.  Patient son was supposed to show up the following afternoon for a bedside conversation with the physician but he never presented to the hospital.  As of 11/14 comfort measures have been formally initiated.  Subjective: Patient awake but confused.  Stated she was not hungry but then said she was going to need to get out of bed, take a shower get dressed and go fix herself something to eat.  Objective: Vitals:   09/07/21 1715 09/07/21 2050  BP: 116/60 119/72  Pulse: (!) 111 (!) 113  Resp: 18 18  Temp: 100.2 F (37.9 C) 99.1 F (37.3 C)  SpO2: 100% 98%    Intake/Output Summary (Last 24 hours) at 09/08/2021 0745 Last data filed at 09/07/2021 1320 Gross per 24 hour  Intake 0 ml  Output 0 ml  Net 0 ml    Filed Weights   09/02/21 2130  Weight: 90.3 kg    Exam:  Constitutional: Lethargic and bedbound but awakens easily and attempts to engage in conversation. Respiratory: Lung sounds remain clear to auscultation although diminished especially in the bases.  Stable on room air.  No increased work of breathing.  O2 saturations 98% Cardiovascular: S1-S2, pulse regular and tachycardic, stable bilateral UE edema Abdomen: LBM 11/12, abdomen soft nontender nondistended.  Bowel sounds + Neurologic: CN 2-12 grossly intact upon visual inspection-sensation remains intact, very limited spontaneous movement.  Strength 1-2/5 Psychiatric: Lethargic but awakens when spoken to her stimulated.  Oriented to person only  Assessment/Plan: Acute problems: ESRD on HD Recently had been only able to tolerate dialysis 2 days/week and was unable to tolerate dialysis in chair. Nephrologist contacted son on 11/9 to discuss rationale for ceasing hemodialysis.  At that time [name] wished to defer to his mother and had planned on coming by the hospital to speak to her.  The following day nephrologist attempted to follow-up  with the son regarding previous decision and contacted him at the times son provided he would be available.  Unfortunately he did not answer the phone.  Decision made by nephrologist that given patient's intolerance to dialysis in the bed including repeated clinical signs that patient was uncomfortable and not tolerating dialysis (patient repeatedly crying out help me help me during dialysis and recurrent symptomatic tachycardia) that dialysis would be stopped. On 11/12 Dr Jonnie Finner with nephrology spoke with the patient's son for over 30 minutes to explain why dialysis was no longer being offered.  The son was not receptive to this information and hung up the phone but not before the nephrologist offered to meet in person to discuss the situation.  Unfortunately the son did not show up for the meeting as arranged. 11/14 full comfort measures have been initiated  Sepsis physiology/RLL HCAP/Abn blood cxs c/w contaminant Completed 7 days of Maxipime  Failure to thrive with generalized deconditioning in context of neurogenic dysphagia Given acute stroke patient is no longer a candidate for open G-tube placement.  Her son agrees  Follow-up swallowing evaluation revealed no evidence of mechanical dysphagia Oral mucous membranes are extremely dry and oral intake remains extremely poor  Acute on chronic lower GI bleeding from  stercoral ulcers/h/o proctocolitis May 2022 11/10 pt had red colored stools -FOBT was positive.  Eliquis and prednisone discontinued.  Family updated. EGD in July (this admit) neg but flex sig revealed non bleeding stercoral ulcer Hemoglobin 11/10 was 7.1 and has drifted down from a peak of 8.5  -Hemoglobin up to 9.2 on 11/11.  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  Given recent and recurrent stroke she is not a candidate for operative placement/open G-tube  Suspected dementia/recent delirium Continue Remeron as well as prn medications in the EOL order set  Neck pain Continue IV Robaxin Concentrated SL morphine also ordered  Type 2 diabetes mellitus Episodes of hypoglycemia Comfort measures in place so we will no longer be checking CBG readings or be providing treatment for potential hyperglycemia or hypoglycemia  Stage II mid coccyx pressure injury, present on admission Continue local wound care for comfort        Data Reviewed:  CBC: Recent Labs  Lab 09/04/21 1000 09/05/21 0656  WBC 16.8* 13.0*  NEUTROABS  --  10.6*  HGB 7.1* 9.2*  HCT 24.1* 29.6*  MCV 86.1 84.3  PLT 453* 476*     CBG: Recent Labs  Lab 09/06/21 1637 09/06/21 2044 09/07/21 0647 09/07/21 1128 09/07/21 1654  GLUCAP 190* 161* 196* 192* 158*     Scheduled Meds:  (feeding supplement) PROSource Plus  30 mL Oral BID BM   Chlorhexidine Gluconate Cloth  6 each Topical Q0600   darbepoetin (ARANESP) injection - DIALYSIS  200 mcg Intravenous Q Mon-HD   feeding supplement  237 mL Oral TID BM   gabapentin  100 mg Oral Q12H   mirtazapine  7.5 mg Oral QHS   multivitamin  1 tablet Oral QHS   OLANZapine zydis  2.5 mg Oral QHS   sodium chloride flush  10-40 mL Intracatheter Q12H   venlafaxine  37.5 mg Oral BID     Active Problems:    Essential hypertension   Type 2 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, with long-term current use of insulin (HCC)   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   ESRD on dialysis ([place])   Dyslipidemia   Stage 2 skin ulcer of sacral region ([place])   Cerebral thrombosis with cerebral infarction   Protein-calorie malnutrition, severe   Acute GI bleeding   Failure to thrive in adult   Rectal bleeding   Delirium due to another medical condition   Neurogenic dysphagia   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

## 2021-09-08 NOTE — TOC Progression Note (Signed)
Transition of Care [facility]) - Progression Note    Patient Details  Name: Amy [name] MRN: [id number] Date of Birth: [birth year]  Transition of Care [facility]) CM/SW [place], RN Phone Number: 09/08/2021, 9:44 AM  Clinical Narrative:    Case management spoke with Erin Hearing, NP this morning and the patient continues on comfort care at this time.  Hemodialysis has been discontinued per physician notes.  I called and left a secure message with the patient's son, Cierria Height on his voice mail so that I can offer choice regarding inpatient hospice services to the patient to continue comfort care measures outside of the inpatient hospital environment.  CM and MSW with DTP Team will continue to follow the patient for TOC needs.  Patient continues with comfort care orders.   Expected Discharge Plan: [place] Barriers to Discharge: Continued Medical Work up  Expected Discharge Plan and Services Expected Discharge Plan: [place] In-house Referral: Clinical Social Work     Living arrangements for the past 2 months: [place] (Patient came to hospital from [place])                                       Social Determinants of Health (SDOH) Interventions    Readmission Risk Interventions Readmission Risk Prevention Plan 08/04/2021  Transportation Screening Complete  Medication Review Press photographer) Complete  PCP or Specialist appointment within 3-5 days of discharge Complete  HRI or Home Care Consult Complete  SW Recovery Care/Counseling Consult Complete  Palliative Care Screening Complete  Skilled Nursing Facility Complete  Some recent data might be hidden

## 2021-09-08 NOTE — Plan of Care (Signed)
  Problem: Skin Integrity: Goal: Risk for impaired skin integrity will decrease Outcome: Progressing   

## 2021-09-08 NOTE — Progress Notes (Signed)
OT Cancellation Note  Patient Details Name: Amy [name] MRN: [id number] DOB: 1946-01-07   Cancelled Treatment:    Reason Eval/Treat Not Completed: Medical issues which prohibited therapy (Pt changed to comfort care. Stopping dialysis. No need for increasing OOB tolerance. Will sign off.)  [place], OTR/L Acute Rehab Pager: [phone] Office: [phone] 09/08/2021, 2:18 PM

## 2021-09-09 DIAGNOSIS — E43 Unspecified severe protein-calorie malnutrition: Secondary | ICD-10-CM | POA: Diagnosis not present

## 2021-09-09 DIAGNOSIS — Z515 Encounter for palliative care: Secondary | ICD-10-CM | POA: Diagnosis not present

## 2021-09-09 DIAGNOSIS — R1319 Other dysphagia: Secondary | ICD-10-CM | POA: Diagnosis not present

## 2021-09-09 DIAGNOSIS — R627 Adult failure to thrive: Secondary | ICD-10-CM | POA: Diagnosis not present

## 2021-09-09 MED ORDER — OXYCODONE HCL 20 MG/ML PO CONC: 5.0000 mg | ORAL | 0 refills | Status: AC | PRN

## 2021-09-09 MED ORDER — LORAZEPAM 2 MG/ML PO CONC
1.0000 mg | ORAL | 0 refills | Status: AC | PRN
Start: 2021-09-09 — End: ?

## 2021-09-09 MED ORDER — MORPHINE SULFATE (PF) 2 MG/ML IV SOLN: 1.0000 mg | INTRAVENOUS | 0 refills | Status: AC | PRN

## 2021-09-09 MED ORDER — LORAZEPAM 1 MG PO TABS: 1.0000 mg | ORAL_TABLET | ORAL | 0 refills | Status: AC | PRN

## 2021-09-09 MED ORDER — METHOCARBAMOL 1000 MG/10ML IJ SOLN: 500.0000 mg | Freq: Four times a day (QID) | INTRAVENOUS | Status: AC | PRN

## 2021-09-09 MED ORDER — LORAZEPAM 2 MG/ML IJ SOLN
1.0000 mg | INTRAMUSCULAR | 0 refills | Status: AC | PRN
Start: 2021-09-09 — End: ?

## 2021-09-09 MED ORDER — HALOPERIDOL LACTATE 2 MG/ML PO CONC
0.5000 mg | ORAL | 0 refills | Status: AC | PRN
Start: 2021-09-09 — End: ?

## 2021-09-09 MED ORDER — HALOPERIDOL 0.5 MG PO TABS: 0.5000 mg | ORAL_TABLET | ORAL | Status: AC | PRN

## 2021-09-09 MED ORDER — HALOPERIDOL LACTATE 5 MG/ML IJ SOLN: 0.5000 mg | INTRAMUSCULAR | Status: AC | PRN

## 2021-09-09 NOTE — Progress Notes (Addendum)
[place] [facility]) Hospital Liaison  Referral received from [facility] manager(Michelle) requesting transfer to [facility]. Chart reviewed and eligibility confirmed. Spoke with patient's son [name] to confirm interest and explain services. Family agreeable to transfer once bed is available.   [facility] is unable to offer a room today. Hospital Liaison will follow up tomorrow or sooner if a room becomes available. Please do not hesitate to call with questions.    Thank you for the opportunity to care for this patient.   Zollie Pee [facility] [place] Liaison [phone]

## 2021-09-09 NOTE — TOC Progression Note (Addendum)
Transition of Care [facility]) - Progression Note    Patient Details  Name: Amy [name] MRN: [id number] Date of Birth: 1946/05/07  Transition of Care [facility]) CM/SW [place], RN Phone Number: 09/09/2021, 7:41 AM  Clinical Narrative:    Case management spoke with Baker Pierini, son on the phone and choice was offered for Inpatient Hospice and the patient's son was agreeable to referral to [facility] in [place], [place].  [facility] Liaison was called and message was left to follow up to initiate hospice care for the patient.  The son is agreeable to communicate with the liaison.  09/09/2021 1027 - CM spoke with Earnestine Mealing, CM/ MSW with [facility] and referral placed for [facility].  [place], MSW with [facility] will follow up with the patient's son, Nnenna Meador this morning for Inpatient hospice discussion.  CM and MSW with DTP will continue to follow for likely Inpatient hospice placement.   Expected Discharge Plan: [place] Barriers to Discharge: Continued Medical Work up  Expected Discharge Plan and Services Expected Discharge Plan: [place] In-house Referral: Clinical Social Work     Living arrangements for the past 2 months: [place] (Patient came to hospital from [place])                                       Social Determinants of Health (SDOH) Interventions    Readmission Risk Interventions Readmission Risk Prevention Plan 08/04/2021  Transportation Screening Complete  Medication Review Press photographer) Complete  PCP or Specialist appointment within 3-5 days of discharge Complete  HRI or Home Care Consult Complete  SW Recovery Care/Counseling Consult Complete  Palliative Care Screening Complete  Skilled Nursing Facility Complete  Some recent data might be hidden

## 2021-09-09 NOTE — Discharge Summary (Signed)
Physician Discharge Summary  Amy [name] QQV:[id number] DOB: April 09, 1946 DOA: 05/19/2021  PCP: [name], MD  Admit date: 05/19/2021 Discharge date: 09/10/2021  Time spent: 35 minutes  Recommendations for Outpatient Follow-up:  Discharge to [facility] residential hospice   Discharge Diagnoses:  Active Problems:   Essential hypertension   Type 2 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, with long-term current use of insulin (HCC)   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   ESRD on dialysis ([place])   Dyslipidemia   Stage 2 skin ulcer of sacral region [facility])   Cerebral thrombosis with cerebral infarction   Protein-calorie malnutrition, severe   Acute GI bleeding   Failure to thrive in adult   Rectal bleeding   Delirium due to another medical condition   Neurogenic dysphagia   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 Patient presented to the ED on 7/25 with dark stool.  On presentation, hemoglobin was 9.6; GI was consulted.  She underwent EGD which was normal and flexible sigmoidoscopy which showed nonbleeding distal rectal ulcers/stercoral ulcers.  Subsequently, GI signed off.   Neurology was also consulted for altered mental  status; MRI brain was similar to prior MRI at [facility] with a large right MCA stroke.  EEG this admission unremarkable. Nephrology has been following for dialysis needs. PEG tube was desired by family but could not be placed because of overlying transverse colon.  General surgery was consulted for G-tube placement but patient does not want a G-tube and  was deemed to have capacity by psych team.  Subsequently after further more detailed evaluations and observation it was finally clarified that she lacked capacity.  Surgical team was consulted the fact that the patient lacks underlying dysphagia and even though she lacks capacity continues to express a desire to not have a gastrostomy tube placed they are reluctant to proceed with surgery and have ordered follow-up SLP evaluation regarding dysphagia.  10/10 Due to acute AMS CT of the head was obtained that revealed possible changes concerning for acute versus subacute stroke.  MRI showed similar findings.  At this point patient was deemed no longer an appropriate candidate for open gastrostomy tube placement.  This was discussed with the son.  He will remain reluctant regarding discontinuing dialysis since his mother told him she wanted to continue and did not wish to die.  Attempts were made to see if patient could tolerate requirements for outpatient dialysis which included ability to tolerate sitting up in recliner for the duration of dialysis treatment.  Unfortunately after several weeks of attempting to replicate outpatient hemodialysis requirements patient was unable to tolerate dialysis except in the bed.  She was not tolerating dialysis well in the bed either noting that she was having repeated episodes of dialysis induced tachycardia requiring fluid boluses and she was quite uncomfortable  repeatedly yelling help help help during most of her dialysis sessions.  On 11/11 nephrology team decided patient was no longer a candidate for continued dialysis treatments.   This was communicated to the family on 11/12.  Although this was difficult for them to accept this decision they eventually agreed to transitioning to residential hospice.  Hospital Course:  ESRD on HD Recently had been only able to tolerate dialysis 2 days/week and was unable to tolerate dialysis in chair. Nephrologist has stopped HD 11/14 full comfort measures have been initiated   Sepsis physiology/RLL HCAP/Abn blood cxs c/w contaminant Completed 7 days of Maxipime   Failure to thrive with generalized deconditioning in context of neurogenic dysphagia Given acute stroke patient is no longer a candidate for open G-tube placement.  Her son agrees  Follow-up swallowing evaluation revealed no evidence of mechanical dysphagia   Acute on chronic lower GI bleeding from stercoral ulcers/h/o proctocolitis May 2022 11/10 pt had red colored stools -FOBT was positive.  Eliquis and prednisone discontinued.    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-recurrent acute on subacute stroke Recent MRI 10/10 revealed right posterior frontal, posterior temporal, parietal and occipital acute versus subacute infarct Eliquis discontinued secondary to recent rectal bleeding and now comfort status   LUE and RUE edema 2/2 superficial thrombophlebitis LUE edema chronic in nature-Recent venous duplex c/w superficial thrombophlebitis No longer on Eliquis given recent GI bleeding symptoms   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  Given recent and recurrent stroke she is not a candidate for operative placement/open G-tube   Suspected dementia/recent delirium Continue Remeron as well as prn medications in the EOL order set   Neck pain Continue IV Robaxin Concentrated SL morphine also ordered   Type 2 diabetes  mellitus Episodes of hypoglycemia Comfort measures in place so we will no longer be checking CBG readings or be providing treatment for potential hyperglycemia or hypoglycemia   Stage II mid coccyx pressure injury, present on admission Continue local wound care for comfort  Procedures: EEG Echocardiogram Cortrack EGD/flexible sigmoidoscopy  Consultations: Vascular surgery Nephrology Gastroenterology Neurology Palliative medicine General surgery Ethics committee  Discharge Exam: Vitals:   09/09/21 0523 09/09/21 0923  BP: (!) 125/44 (!) 116/48  Pulse: 93 98  Resp: 18 18  Temp: 99 F (37.2 C) 98.2 F (36.8 C)  SpO2: 98% 100%   Constitutional: Lethargic and bedbound but awakens easily Respiratory: Lung sounds remain clear to auscultation.  Stable on room air.  No increased work of breathing.  O2 saturations 100% Cardiovascular: S1-S2, pulse regular and tachycardic, stable bilateral UE edema Abdomen: LBM 11/12, abdomen soft nontender nondistended.  Bowel sounds + Neurologic: CN 2-12 grossly intact upon visual inspection-sensation remains intact, very limited spontaneous movement.  Strength 1-2/5 Psychiatric: Lethargic but awakens when spoken to.  Oriented to person only  Discharge Instructions   Discharge Instructions     Diet general   Complete by: As directed    No wound care   Complete by: As directed       Allergies as of 09/10/2021       Reactions   Sensipar [cinacalcet]    Per MAR   Statins    Per MAR        Medication List     STOP taking these medications    acetaminophen 500 MG tablet Commonly known as: TYLENOL  Replaced by: acetaminophen 160 MG/5ML solution   apixaban 5 MG Tabs tablet Commonly known as: ELIQUIS   atorvastatin 40 MG tablet Commonly known as: LIPITOR   dibucaine 1 % Oint Commonly known as: NUPERCAINAL   diclofenac Sodium 1 % Gel Commonly known as: VOLTAREN   ENSURE CLEAR PO   FLUoxetine 40 MG capsule Commonly  known as: PROZAC   gabapentin 100 MG capsule Commonly known as: NEURONTIN Replaced by: gabapentin 250 MG/5ML solution   hydrocortisone 1 % ointment   lactulose 10 GM/15ML solution Commonly known as: CHRONULAC   lidocaine 5 % Commonly known as: LIDODERM   lidocaine-prilocaine cream Commonly known as: EMLA   midodrine 10 MG tablet Commonly known as: PROAMATINE   multivitamin Tabs tablet   nystatin cream Commonly known as: MYCOSTATIN   ondansetron 4 MG tablet Commonly known as: ZOFRAN Replaced by: ondansetron 4 MG/2ML Soln injection   oxyCODONE 5 MG immediate release tablet Commonly known as: Oxy IR/ROXICODONE Replaced by: oxyCODONE 20 MG/ML concentrated solution   pantoprazole 20 MG tablet Commonly known as: PROTONIX   venlafaxine XR 75 MG 24 hr capsule Commonly known as: EFFEXOR-XR       TAKE these medications    acetaminophen 160 MG/5ML solution Commonly known as: TYLENOL Take 20.3 mLs (650 mg total) by mouth every 6 (six) hours as needed for mild pain, fever or headache. Replaces: acetaminophen 500 MG tablet   antiseptic oral rinse Liqd Apply 15 mLs topically as needed for dry mouth.   gabapentin 250 MG/5ML solution Commonly known as: NEURONTIN Take 2 mLs (100 mg total) by mouth every 12 (twelve) hours. Replaces: gabapentin 100 MG capsule   glycopyrrolate 1 MG tablet Commonly known as: ROBINUL Take 1 tablet (1 mg total) by mouth every 4 (four) hours as needed (excessive secretions).   glycopyrrolate 0.2 MG/ML injection Commonly known as: ROBINUL Inject 1 mL (0.2 mg total) into the skin every 4 (four) hours as needed (excessive secretions).   glycopyrrolate 0.2 MG/ML injection Commonly known as: ROBINUL Inject 1 mL (0.2 mg total) into the vein every 4 (four) hours as needed (excessive secretions).   haloperidol 0.5 MG tablet Commonly known as: HALDOL Take 1 tablet (0.5 mg total) by mouth every 4 (four) hours as needed for agitation (or  delirium).   haloperidol 2 MG/ML solution Commonly known as: HALDOL Place 0.3 mLs (0.6 mg total) under the tongue every 4 (four) hours as needed for agitation (or delirium).   haloperidol lactate 5 MG/ML injection Commonly known as: HALDOL Inject 0.1 mLs (0.5 mg total) into the vein every 4 (four) hours as needed (or delirium).   ipratropium-albuterol 0.5-2.5 (3) MG/3ML Soln Commonly known as: DUONEB Take 3 mLs by nebulization every 6 (six) hours as needed.   LORazepam 1 MG tablet Commonly known as: ATIVAN Take 1 tablet (1 mg total) by mouth every 4 (four) hours as needed for anxiety.   LORazepam 2 MG/ML concentrated solution Commonly known as: ATIVAN Place 0.5 mLs (1 mg total) under the tongue every 4 (four) hours as needed for anxiety.   LORazepam 2 MG/ML injection Commonly known as: ATIVAN Inject 0.5 mLs (1 mg total) into the vein every 4 (four) hours as needed for anxiety.   methocarbamol 500 mg in dextrose 5 % 50 mL Inject 500 mg into the vein every 6 (six) hours as needed.   mirtazapine 7.5 MG tablet Commonly known as: REMERON Take 7.5 mg by mouth at bedtime.   morphine 2 MG/ML injection Inject 0.5  mLs (1 mg total) into the vein every 2 (two) hours as needed (or dyspnea).   OLANZapine zydis 5 MG disintegrating tablet Commonly known as: ZYPREXA Take 0.5 tablets (2.5 mg total) by mouth at bedtime.   ondansetron 4 MG disintegrating tablet Commonly known as: ZOFRAN-ODT Take 1 tablet (4 mg total) by mouth every 6 (six) hours as needed for nausea.   ondansetron 4 MG/2ML Soln injection Commonly known as: ZOFRAN Inject 2 mLs (4 mg total) into the vein every 6 (six) hours as needed for nausea. Replaces: ondansetron 4 MG tablet   oxyCODONE 20 MG/ML concentrated solution Commonly known as: ROXICODONE INTENSOL Take 0.3 mLs (6 mg total) by mouth every 2 (two) hours as needed for moderate pain (or dyspnea). Replaces: oxyCODONE 5 MG immediate release tablet   oxyCODONE 20  MG/ML concentrated solution Commonly known as: ROXICODONE INTENSOL Place 0.3 mLs (6 mg total) under the tongue every 2 (two) hours as needed for moderate pain (or dyspnea).   polyvinyl alcohol 1.4 % ophthalmic solution Commonly known as: LIQUIFILM TEARS Place 1 drop into both eyes 4 (four) times daily as needed for dry eyes.       Allergies  Allergen Reactions   Sensipar [Cinacalcet]     Per MAR   Statins     Per MAR    Contact information for follow-up providers     [facility] Follow up.   Specialty: Hospice and Palliative Medicine Why: [facility] will be following the patient for admission. Contact information: [place] [phone]             Contact information for after-discharge care     Destination     [facility] Preferred SNF .   Service: Skilled Nursing Contact information: [address] [place] [phone]                      The results of significant diagnostics from this hospitalization (including imaging, microbiology, ancillary and laboratory) are listed below for reference.    Significant Diagnostic Studies: DG CHEST PORT 1 VIEW  Result Date: 08/21/2021 CLINICAL DATA:  Fever EXAM: PORTABLE CHEST 1 VIEW COMPARISON:  Chest x-ray 08/04/2021 FINDINGS: Heart size is normal. Mediastinum appears stable. Calcified plaques in the aortic arch. Left-sided central line is stable with the tip in the SVC. Elevated right hemidiaphragm with hazy opacities at the right lung base and blunting of the right costophrenic angle. Minor likely subsegmental atelectasis at the left lung base. No pneumothorax. IMPRESSION: Small right pleural effusion with associated atelectasis/infiltrate similar to previous study. Chronic elevated right hemidiaphragm. Electronically Signed   By: Ofilia Neas M.D.   On: 08/21/2021 11:26   VAS [place] UPPER EXTREMITY VENOUS  DUPLEX  Result Date: 08/22/2021 UPPER VENOUS STUDY  Patient Name:  LUCKY ALVERSON Yohannes  Date of Exam:   08/22/2021 Medical Rec #: [id number]         Accession #:    [id number] Date of Birth: [birth year]         Patient Gender: F Patient Age:   28 years Exam Location[facility]  [facility] Procedure:      VAS [place] UPPER EXTREMITY VENOUS DUPLEX Referring Phys: Erin Hearing --------------------------------------------------------------------------------  Indications: Edema Limitations: Poor ultrasound/tissue interface and edema. Comparison Study: No previous RUE exams Performing Technologist: Jody Hill RVT, RDMS  Examination Guidelines: A complete evaluation includes B-mode imaging, spectral Doppler, color Doppler, and power Doppler as needed of all  accessible portions of each vessel. Bilateral testing is considered an integral part of a complete examination. Limited examinations for reoccurring indications may be performed as noted.  Right Findings: +----------+------------+---------+-----------+----------+-------+ RIGHT     CompressiblePhasicitySpontaneousPropertiesSummary +----------+------------+---------+-----------+----------+-------+ IJV           Full       Yes       Yes                      +----------+------------+---------+-----------+----------+-------+ Subclavian    Full       Yes       Yes                      +----------+------------+---------+-----------+----------+-------+ Axillary      Full       Yes       Yes                      +----------+------------+---------+-----------+----------+-------+ Brachial      Full       Yes       Yes                      +----------+------------+---------+-----------+----------+-------+ Radial        Full                                          +----------+------------+---------+-----------+----------+-------+ Ulnar         Full                                           +----------+------------+---------+-----------+----------+-------+ Cephalic      None       No        No                Acute  +----------+------------+---------+-----------+----------+-------+ Basilic       Full       Yes       Yes                      +----------+------------+---------+-----------+----------+-------+  Left Findings: +----------+------------+---------+-----------+----------+-------+ LEFT      CompressiblePhasicitySpontaneousPropertiesSummary +----------+------------+---------+-----------+----------+-------+ Subclavian               Yes       Yes                      +----------+------------+---------+-----------+----------+-------+ Subclavian patent by color and doppler - unable to compress due line placement.  Summary:  Right: No evidence of deep vein thrombosis in the upper extremity. Findings consistent with acute superficial vein thrombosis involving the right cephalic vein.  *See table(s) above for measurements and observations.  Diagnosing physician: Jamelle Haring Electronically signed by Jamelle Haring on 08/22/2021 at 4:51:59 PM.    Final       Labs: Basic Metabolic Panel: Recent Labs  Lab 09/04/21 1000  NA 138  K 4.2  CL 99  CO2 30  GLUCOSE 152*  BUN 32*  CREATININE 4.54*  CALCIUM 7.9*  PHOS 2.3*   Liver Function Tests: Recent Labs  Lab 09/04/21 1000  ALBUMIN <1.5*    CBC: Recent Labs  Lab 09/04/21 1000 09/05/21 0656  WBC 16.8* 13.0*  NEUTROABS  --  10.6*  HGB 7.1* 9.2*  HCT 24.1* 29.6*  MCV 86.1 84.3  PLT 453* 476*    CBG: Recent Labs  Lab 09/06/21 2044 09/07/21 0647 09/07/21 1128 09/07/21 1654 09/08/21 1144  GLUCAP 161* 196* 192* 158* 182*       Signed:  Erin Hearing ANP [facility] 09/10/2021, 12:35 PM

## 2021-09-09 NOTE — Progress Notes (Signed)
[facility] PROGRESS NOTE  Amy [name] YQI:[id number] DOB: 19-Dec-1945 DOA: 05/19/2021 PCP: [name], MD                          Status:  Remains inpatient appropriate because: Unsafe d/c plan, IV treatments appropriate due to intensity of illness or inability to take PO, and Inpatient level of care appropriate due to severity of illness patient continues to require dialysis as requested by family and until she is able to transition to the standards of outpatient dialysis she must remain in the hospital.  Barriers to discharge:  Patient needs to be able to tolerate transport to an outpatient dialysis center.  She needs to demonstrate tolerance to sitting in recliner for 3-1/2 to 4 hours and thus far has been unsuccessful in meeting this requirement  Level of care:  Med-Surg  Code Status: DNR Family communication: 11/11 updated son and goddaughter by phone; nephrologist spoke with son at length on 11/12 DVT prophylaxis: Eliquis COVID vaccination status: Amy [name] Overall 02/26/2020, [place] 04/10/2021    HPI: 75 y.o. 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 Patient presented to the ED on 7/25 with dark stool.  On presentation, hemoglobin was 9.6; GI was consulted.  She underwent EGD which was normal and flexible sigmoidoscopy which showed nonbleeding distal rectal ulcers/stercoral ulcers.  Subsequently, GI signed off.  Neurology was also consulted for altered mental status; MRI brain was similar to prior MRI at [facility] with a large right MCA stroke.  EEG this admission unremarkable. PEG tube was desired by family but could not be placed because of overlying transverse colon.   General surgery was consulted for G-tube placement. Patient deemed high risk for open placement of gastrostomy tube.  Subsequently she has suffered another stroke which precludes her from any surgical procedures including open G-tube placement.  This was discussed with her son who was in agreement.  Unfortunately he was more conflicted about stopping dialysis noting prior to the stroke and subsequently since that time each time he is asked his mother regarding stopping dialysis she has told him that she does not want to die and wants to continue dialysis.  Consideration given to transitioning to residential hospice once family in agreement about how to proceed with end-of-life issues.  Patient did have an episode of decline immediately following dialysis on 10/20 but subsequently recovered.  She continues not to eat and intermittently takes medications by mouth.  Unfortunately oral intake is inadequate.  Because of suboptimal blood pressure readings and tachycardia during dialysis dialysis sessions have been decreased to twice weekly.  She is inconsistently tolerating out of bed to chair in her room between 2-1/2 and 3-1/2 hours but has yet been able to tolerate dialysis in a chair.  Nephrologist has initiated discussions with family regarding above issues and recommendation to stop dialysis and pursue hospice.  On 11/10 early a.m. patient was noted with red-colored stools but has not been having frank bleeding.  FOBT positive so prednisone and Eliquis discontinued.    Nephrology has documented that continuing dialysis providing her no appreciable long-term benefit and appears to be negatively affecting her quality of life.  Nephrology at this time is not planning to schedule  any further dialysis treatments. Nephrology updated son Amy [name] on Saturday 11/12.  Currently the son was not receptive to the information presented and the rationale for stopping hemodialysis.  He became very angry.  Patient son was  supposed to show up the following afternoon for a bedside conversation with the physician but he never presented to the hospital.  As of 11/14 comfort measures have been formally initiated.  Subjective: Briefly awakens.  Appears to be comfortable.  No complaints verbalized.  Objective: Vitals:   09/08/21 1633 09/09/21 0523  BP: (!) 163/73 (!) 125/44  Pulse: (!) 106 93  Resp: 18 18  Temp: 98.8 F (37.1 C) 99 F (37.2 C)  SpO2: 100% 98%    Intake/Output Summary (Last 24 hours) at 09/09/2021 0751 Last data filed at 09/08/2021 1700 Gross per 24 hour  Intake 60 ml  Output --  Net 60 ml    Filed Weights   09/02/21 2130  Weight: 90.3 kg    Exam:  Constitutional: Lethargic and bedbound but awakens easily Respiratory: Lung sounds remain clear to auscultation.  Stable on room air.  No increased work of breathing.  O2 saturations 100% Cardiovascular: S1-S2, pulse regular and tachycardic, stable bilateral UE edema Abdomen: LBM 11/12, abdomen soft nontender nondistended.  Bowel sounds + Neurologic: CN 2-12 grossly intact upon visual inspection-sensation remains intact, very limited spontaneous movement.  Strength 1-2/5 Psychiatric: Lethargic but awakens when spoken to.  Oriented to person only  Assessment/Plan: Acute problems: ESRD on HD Recently had been only able to tolerate dialysis 2 days/week and was unable to tolerate dialysis in chair. Nephrologist has stopped HD 11/14 full comfort measures have been initiated  Sepsis physiology/RLL HCAP/Abn blood cxs c/w contaminant Completed 7 days of Maxipime  Failure to thrive with generalized deconditioning in context of neurogenic dysphagia Given acute stroke patient is no longer a candidate for open G-tube placement.  Her son agrees  Follow-up swallowing evaluation revealed no evidence of mechanical dysphagia Oral mucous membranes are extremely dry and oral intake remains extremely poor  Acute on chronic lower GI bleeding from  stercoral ulcers/h/o proctocolitis May 2022 11/10 pt had red colored stools -FOBT was positive.  Eliquis and prednisone discontinued.    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  Given recent and recurrent stroke she is not a candidate for operative placement/open G-tube  Suspected dementia/recent delirium Continue Remeron as well as prn medications in the EOL order set  Neck pain Continue IV Robaxin Concentrated SL morphine also ordered  Type 2 diabetes mellitus Episodes of hypoglycemia Comfort measures in place so we will no longer be checking CBG readings or be providing treatment for potential hyperglycemia or hypoglycemia  Stage II mid coccyx pressure injury, present on admission Continue local wound care for comfort        Data Reviewed:  CBC: Recent Labs  Lab 09/04/21 1000 09/05/21 0656  WBC 16.8* 13.0*  NEUTROABS  --  10.6*  HGB 7.1* 9.2*  HCT 24.1*  29.6*  MCV 86.1 84.3  PLT 453* 476*     CBG: Recent Labs  Lab 09/06/21 2044 09/07/21 0647 09/07/21 1128 09/07/21 1654 09/08/21 1144  GLUCAP 161* 196* 192* 158* 182*     Scheduled Meds:  Chlorhexidine Gluconate Cloth  6 each Topical Q0600   feeding supplement  237 mL Oral TID BM   gabapentin  100 mg Oral Q12H   mirtazapine  7.5 mg Oral QHS    OLANZapine zydis  2.5 mg Oral QHS   sodium chloride flush  10-40 mL Intracatheter Q12H     Active Problems:   Essential hypertension   Type 2 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, with long-term current use of insulin (HCC)   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   ESRD on dialysis ([place])   Dyslipidemia   Stage 2 skin ulcer of sacral region ([place])   Cerebral thrombosis with cerebral infarction   Protein-calorie malnutrition, severe   Acute GI bleeding   Failure to thrive in adult   Rectal bleeding   Delirium due to another medical condition   Neurogenic dysphagia   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 ([place])   HCAP (healthcare-associated pneumonia)   Sepsis due to other etiology ([place])   Recurrent cerebrovascular accidents (CVAs) ([place])   End of life care   Consultants: Vascular surgery Nephrology Gastroenterology Neurology Palliative medicine General surgery Ethics committee  Procedures: EEG Echocardiogram Cortrack EGD  Antibiotics: Ceftriaxone 8/3 through 8/5   Time spent: 15 minutes    Erin Hearing ANP  [facility] 7 am - 330 pm/M-F for direct patient care and secure chat Please refer to Amion for contact info 112  days

## 2021-09-10 DIAGNOSIS — Z515 Encounter for palliative care: Secondary | ICD-10-CM | POA: Diagnosis not present

## 2021-09-10 DIAGNOSIS — R627 Adult failure to thrive: Secondary | ICD-10-CM | POA: Diagnosis not present

## 2021-09-10 NOTE — Plan of Care (Signed)
  Problem: Health Behavior/Discharge Planning: Goal: Ability to manage health-related needs will improve Outcome: Not Progressing   

## 2021-09-10 NOTE — Progress Notes (Signed)
DISCHARGE NOTE HOME AMAZIN PINCOCK to be discharged  [facility]  per MD order. Discussed prescriptions and follow up appointments with the patient. Prescriptions given to patient; medication list explained in detail. Patient verbalized understanding.  Skin clean, dry and intact with previously documented wound, no evidence of skin tears noted. Site without signs and symptoms of complications.   Patient free of IV lines, drains, and wounds.   An After Visit Summary (AVS) was printed and given to the patient. Patient escorted via stretcher, and discharged via PTAR.  Jeananne Rama, RN

## 2021-09-10 NOTE — Progress Notes (Signed)
DISCHARGE NOTE SNF KESHAWNA DIX to be discharged to [facility] per MD order. [name] consented on this discharge.  Skin breakdown as documented on flowsheet. IV catheter discontinued intact. Site without signs and symptoms of complications. Dressing and pressure applied. Pt denies pain at the site currently. No complaints noted.  Lt. HD catheter still on.  Discharge packet assembled. An After Visit Summary (AVS) was printed and given to the EMS personnel. Patient escorted via stretcher and discharged to [facility] via ambulance. Report called to accepting facility-Pam R.N.; all questions and concerns addressed.   [place], Zenon Mayo, RN

## 2021-09-10 NOTE — Progress Notes (Addendum)
[facility]) Hospital Liaison Note  Bed offered and accepted at [facility]. Plan is for transfer today. TOC aware. Unit RN please call report to [phone] prior to patient leaving the unit. Please send signed DNR and paperwork with patient. Call with any questions or concerns.   TOC please arrange for transport once confirmation has been received that consents are complete.   Thank you.   Zollie Pee [employer] Liaison [phone]

## 2021-09-10 NOTE — Progress Notes (Deleted)
[facility] PROGRESS NOTE  Amy [name] FMB:[id number] DOB: Jun 27, 1946 DOA: 05/19/2021 PCP: [name], MD                          Status:  Remains inpatient appropriate because: Unsafe d/c plan, IV treatments appropriate due to intensity of illness or inability to take PO, and Inpatient level of care appropriate due to severity of illness patient continues to require dialysis as requested by family and until she is able to transition to the standards of outpatient dialysis she must remain in the hospital.  Barriers to discharge:  Bed availability at residential hospice  Level of care:  Med-Surg  Code Status: DNR Family communication: 11/11 updated son and goddaughter by phone; nephrologist spoke with son at length on 11/12 DVT prophylaxis: Eliquis COVID vaccination status: [name] 02/26/2020, [place] 04/10/2021    HPI: 75 y.o. 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 Patient presented to the ED on 7/25 with dark stool.  On presentation, hemoglobin was 9.6; GI was consulted.  She underwent EGD which was normal and flexible sigmoidoscopy which showed nonbleeding distal rectal ulcers/stercoral ulcers.  Subsequently, GI signed off.  Neurology was also consulted for altered mental status; MRI brain was similar to prior MRI at [facility] with a large right MCA stroke.  EEG this admission unremarkable. PEG tube was desired by family but could not be placed because of overlying transverse colon.  General surgery was consulted for G-tube placement. Patient deemed high risk for open placement of gastrostomy tube.  Subsequently she has suffered another stroke which precludes  her from any surgical procedures including open G-tube placement.  This was discussed with her son who was in agreement.  Unfortunately he was more conflicted about stopping dialysis noting prior to the stroke and subsequently since that time each time he is asked his mother regarding stopping dialysis she has told him that she does not want to die and wants to continue dialysis.  Consideration given to transitioning to residential hospice once family in agreement about how to proceed with end-of-life issues.  Patient did have an episode of decline immediately following dialysis on 10/20 but subsequently recovered.  She continues not to eat and intermittently takes medications by mouth.  Unfortunately oral intake is inadequate.  Because of suboptimal blood pressure readings and tachycardia during dialysis dialysis sessions have been decreased to twice weekly.  She is inconsistently tolerating out of bed to chair in her room between 2-1/2 and 3-1/2 hours but has yet been able to tolerate dialysis in a chair.  Nephrologist has initiated discussions with family regarding above issues and recommendation to stop dialysis and pursue hospice.  On 11/10 early a.m. patient was noted with red-colored stools but has not been having frank bleeding.  FOBT positive so prednisone and Eliquis discontinued.    Nephrology has documented that continuing dialysis providing her no appreciable long-term benefit and appears to be negatively affecting her quality of life.  Nephrology at this time is not planning to schedule any further dialysis treatments. Nephrology updated son [name] on Saturday 11/12.  Currently the son was not receptive to the information presented and the rationale for stopping hemodialysis.  He became very angry.  Patient son was supposed to show up the following afternoon for a bedside conversation with the physician but he never presented to the hospital.  As of 11/14 comfort measures have been formally  initiated.  Subjective: Very lethargic and barely awakens.  No answer when spoken to  Objective: Vitals:   09/09/21 0523 09/09/21 0923  BP: (!) 125/44 (!) 116/48  Pulse: 93 98  Resp: 18 18  Temp: 99 F (37.2 C) 98.2 F (36.8 C)  SpO2: 98% 100%    Intake/Output Summary (Last 24 hours) at 09/10/2021 1107 Last data filed at 09/10/2021 0600 Gross per 24 hour  Intake 1078 ml  Output 0 ml  Net 1078 ml    Filed Weights   09/02/21 2130  Weight: 90.3 kg    Exam:  Constitutional: Lethargic  Respiratory: Lung sounds remain clear to auscultation.  Stable on room air.  No increased work of breathing.   Cardiovascular: S1-S2, pulse regular and tachycardic, stable bilateral UE edema Abdomen: LBM 11/15, abdomen soft nontender nondistended.  Bowel sounds + Neurologic: CN 2-12 grossly intact upon visual inspection-sensation remains intact, very limited spontaneous movement.  Strength not assessed Psychiatric: Lethargic   Assessment/Plan: Acute problems: ESRD on HD Recently had been only able to tolerate dialysis 2 days/week and was unable to tolerate dialysis in chair. Nephrologist has stopped HD 11/14 full comfort measures have been initiated  Sepsis physiology/RLL HCAP/Abn blood cxs c/w contaminant Completed 7 days of Maxipime  Failure to thrive with generalized deconditioning in context of neurogenic dysphagia Given acute stroke patient is no longer a candidate for open G-tube placement.  Her son agrees  Follow-up swallowing evaluation revealed no evidence of mechanical dysphagia  Acute on chronic lower GI bleeding from stercoral ulcers/h/o proctocolitis May 2022 11/10 pt had red colored stools -FOBT was positive.  Eliquis and prednisone discontinued.    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  Given recent and recurrent stroke she is not a candidate for operative placement/open G-tube  Suspected dementia/recent delirium Continue Remeron as well as prn medications in the EOL order set  Neck pain Continue IV Robaxin Concentrated SL morphine also ordered  Type 2 diabetes mellitus Episodes of hypoglycemia Comfort measures in place so we will no longer be checking CBG readings or be providing treatment for potential hyperglycemia or hypoglycemia  Stage II mid coccyx pressure injury, present on admission Continue local wound care for comfort        Data Reviewed:  CBC: Recent Labs  Lab 09/04/21 1000 09/05/21 0656  WBC 16.8* 13.0*  NEUTROABS  --  10.6*  HGB 7.1* 9.2*  HCT 24.1* 29.6*  MCV 86.1 84.3  PLT 453* 476*     CBG: Recent Labs  Lab 09/06/21 2044 09/07/21 0647 09/07/21 1128 09/07/21 1654 09/08/21 1144  GLUCAP 161* 196* 192* 158* 182*     Scheduled Meds:  Chlorhexidine Gluconate Cloth  6 each Topical Q0600   feeding supplement  237 mL Oral TID BM   gabapentin  100 mg Oral Q12H   mirtazapine  7.5 mg Oral QHS   OLANZapine zydis  2.5 mg Oral QHS   sodium chloride flush  10-40 mL Intracatheter Q12H     Active Problems:   Essential hypertension   Type 2 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, with long-term current use of insulin (HCC)   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   ESRD on dialysis ([place])   Dyslipidemia   Stage 2 skin ulcer of sacral region  ([place])   Cerebral thrombosis with cerebral infarction   Protein-calorie malnutrition, severe   Acute GI bleeding   Failure to thrive in adult   Rectal bleeding   Delirium due to another medical condition   Neurogenic dysphagia   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

## 2021-09-10 NOTE — TOC Progression Note (Addendum)
Transition of Care [facility]) - Progression Note    Patient Details  Name: Amy [name] MRN: [id number] Date of Birth: 02-05-1946  Transition of Care [facility]) CM/SW [place], RN Phone Number: 09/10/2021, 10:24 AM  Clinical Narrative:    Case management met with the patient at the bedside while patient was resting comfortably.  The patient is waiting on an available bed to open at [facility] for placement at this time.   09/10/2021 1200 - CM spoke with Earnestine Mealing, MSW with [facility] the facility has an available bed open to the patient today for admission.  [place] was unable to reach the patient's son, [name] on the phone - so I followed up on the phone and was able to reach him by text at his place of work.  MSW from [facility] is going to follow up with the patient's son regarding consent for admission.  Once I get confirmation - I will set up PTAR transportation and will notify the bedside nurse.  PTAR packet was arranged including Medical necessity form, AVS, discharge summary and DNR form.  CM and MSW will continue to follow the patient for [place] facility bed availability.  09/10/2021 1347 -  CM with DTP Team will set up PTAR transport to [facility] once the admission consent forms are signed by the patient's family for probable transfer to [facility] this afternoon.  Bedside RN will be notified of time of transport at this time.  Expected Discharge Plan: [place] Barriers to Discharge: Continued Medical Work up  Expected Discharge Plan and Services Expected Discharge Plan: [place] In-house Referral: Clinical Social Work     Living arrangements for the past 2 months: [place] (Patient came to hospital from [place])                                       Social Determinants of Health (SDOH) Interventions    Readmission Risk  Interventions Readmission Risk Prevention Plan 08/04/2021  Transportation Screening Complete  Medication Review [employer]) Complete  PCP or Specialist appointment within 3-5 days of discharge Complete  HRI or Home Care Consult Complete  SW Recovery Care/Counseling Consult Complete  Palliative Care Screening Complete  Skilled Nursing Facility Complete  Some recent data might be hidden

## 2021-09-11 NOTE — TOC Transition Note (Signed)
Transition of Care [facility]) - CM/SW Discharge Note   Patient Details  Name: Amy [name] MRN: [id number] Date of Birth: May 06, 1946  Transition of Care [facility]) CM/SW Contact:  Curlene Labrum, RN Phone Number: 09/11/2021, 7:54 AM   Clinical Narrative:    Late entry for 09/10/2021 - CM spoke with Earnestine Mealing, CM with [facility] the patient's son, Mariselda Badalamenti signed consents and permission to transfer to [facility] facility.  I called and spoke with [facility] and arranged transportation for transfer to the facility on 09/10/2021.  I spoke with Gwenlyn Perking, RN at bedside and he plans to call nursing report to [facility].  Clinicals for discharge are present at the secretary's desk for transport to the facility.  I called the patient's son, Mykaylah Ballman, and he is aware of patient's transfer to the facility in the next 1-2 hours by [facility] and plans to meet the patient at the facility.   Final next level of care: [place] Barriers to Discharge: Continued Medical Work up   Patient Goals and CMS Choice Patient states their goals for this hospitalization and ongoing recovery are:: Son is in conversation with surgery regarding possible PEG placement and disposition needs. CMS Medicare.gov Compare Post Acute Care list provided to:: Patient Represenative (must comment) Choice offered to / list presented to : Adult Children  Discharge Placement                       Discharge Plan and Services In-house Referral: Clinical Social Work                                   Social Determinants of Health (SDOH) Interventions     Readmission Risk Interventions Readmission Risk Prevention Plan 08/04/2021  Transportation Screening Complete  Medication Review [employer]) Complete  PCP or Specialist appointment within 3-5 days of discharge Complete  HRI or Home Care Consult Complete  SW Recovery Care/Counseling Consult Complete  Palliative  Care Screening Complete  Skilled Nursing Facility Complete  Some recent data might be hidden

## 2021-09-25 DEATH — deceased

## 2021-10-16 ENCOUNTER — Telehealth: Payer: Self-pay | Admitting: Family Medicine

## 2021-10-16 NOTE — Telephone Encounter (Signed)
Left message for patient to call back and schedule Medicare Annual Wellness Visit (AWV) in office.   If not able to come in office, please offer to do virtually or by telephone.  Left office number and my jabber [phone].  Last AWV:05/02/2020  Please schedule at anytime with Nurse Health Advisor.

## 2021-11-01 IMAGING — CT CT PELVIS W/ CM
2 of 3 series · 16 of 46 positions shown, 18 images · IV contrast (omnipaque)
Comparison: None.

CLINICAL DATA: Unexplained leukocytosis. Sacral decubitus ulcer.
Evaluate for abscess.

EXAM:
CT PELVIS WITH CONTRAST
TECHNIQUE: Multidetector CT imaging of the pelvis was performed using the
standard protocol following the bolus administration of intravenous
contrast.
CONTRAST:  100mL OMNIPAQUE IOHEXOL 300 MG/ML  SOLN

[Series 5: pelvis with 5.0 · axial · 0.92mm/px · z∈[-142,+118]mm · 13 of 60 slices shown, 15 images]
[im 4/60  soft-tissue]
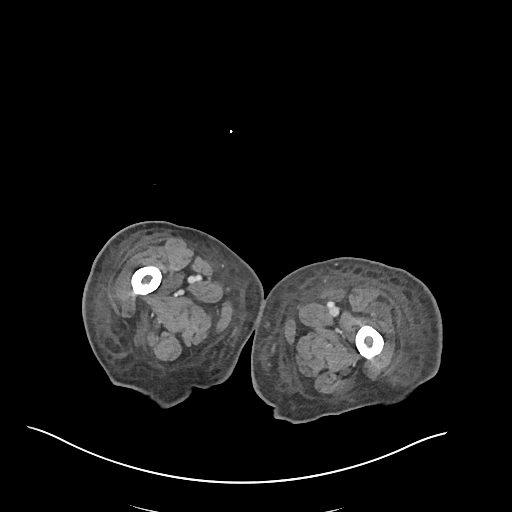
[im 4/60  bone]
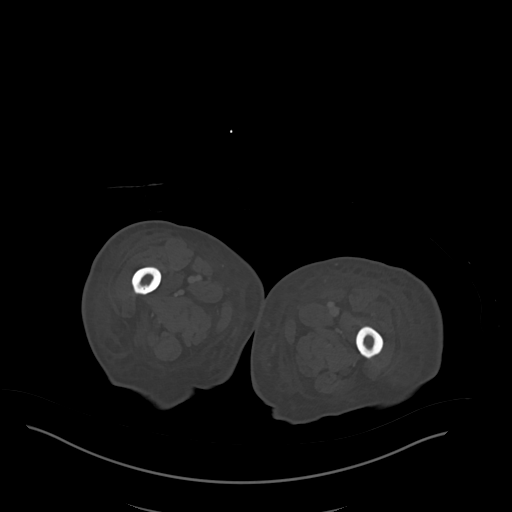
[im 8/60  soft-tissue]
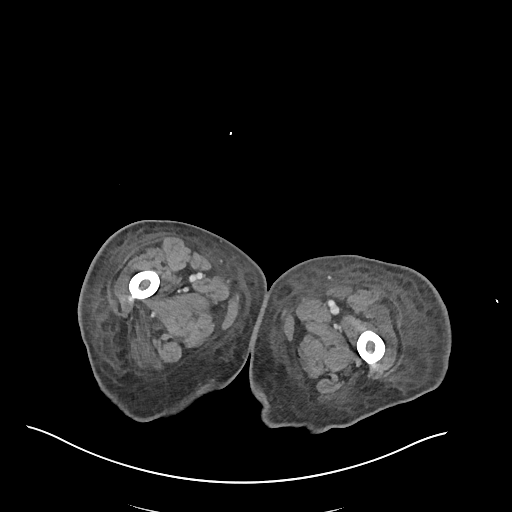
[im 12/60  soft-tissue]
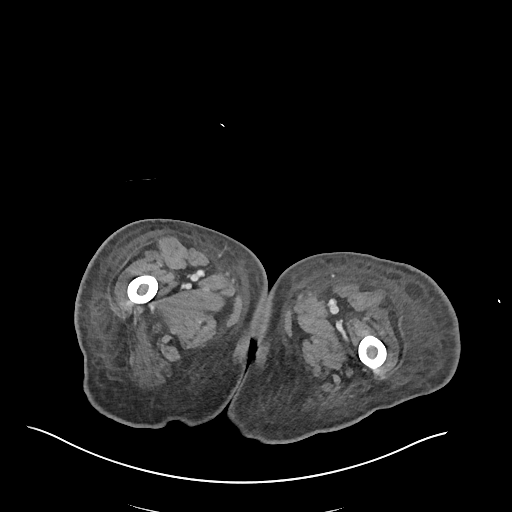
[im 18/60  soft-tissue]
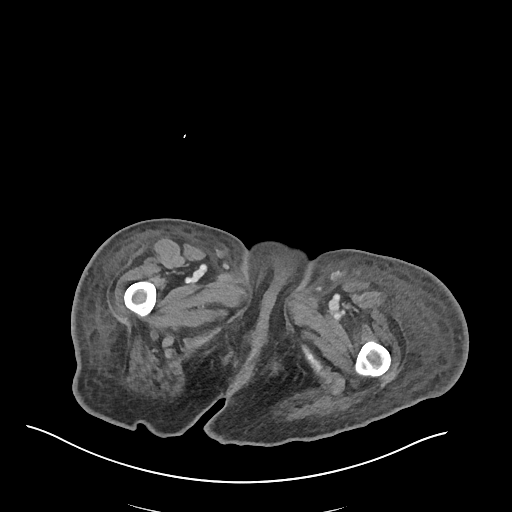
[im 21/60  soft-tissue]
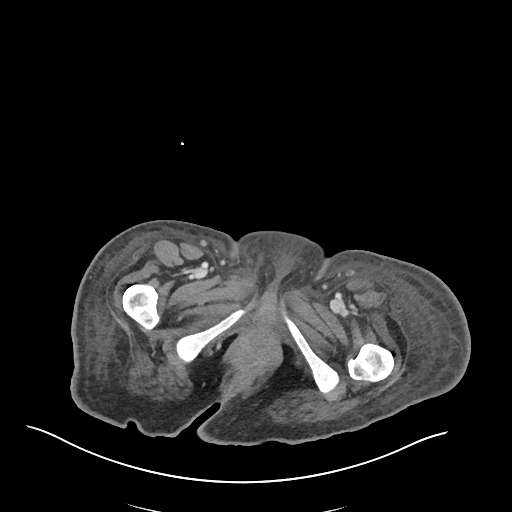
[im 25/60  soft-tissue]
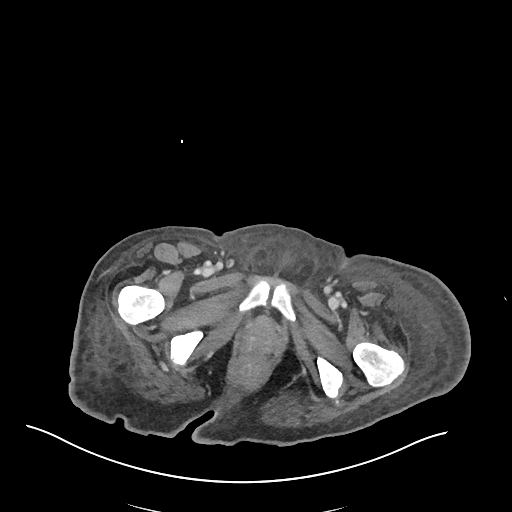
[im 31/60  soft-tissue]
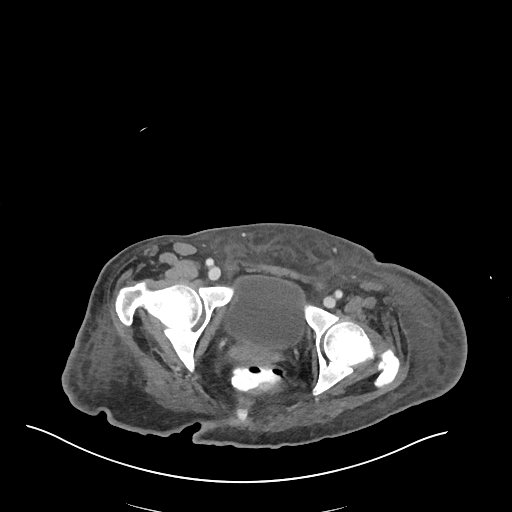
[im 35/60  soft-tissue]
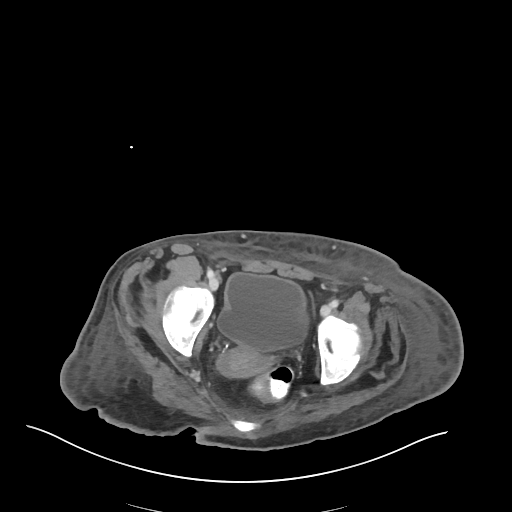
[im 39/60  soft-tissue]
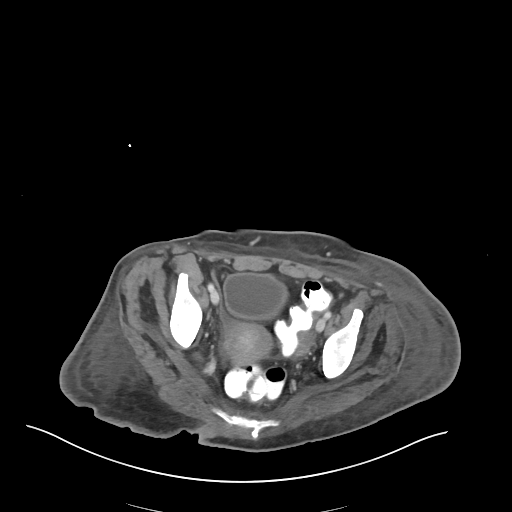
[im 39/60  bone]
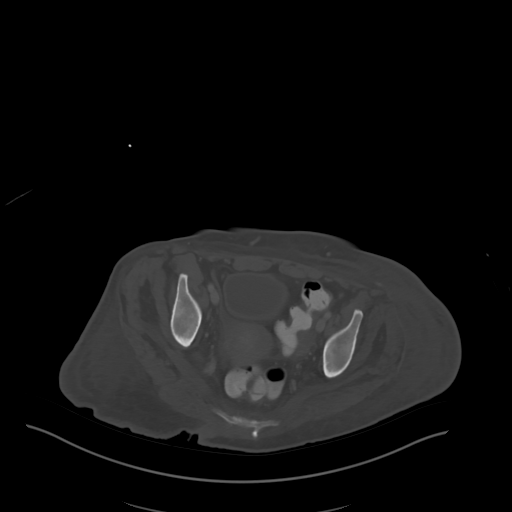
[im 42/60  soft-tissue]
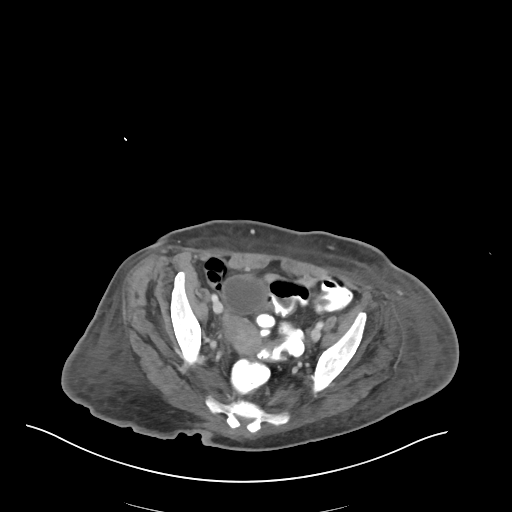
[im 48/60  soft-tissue]
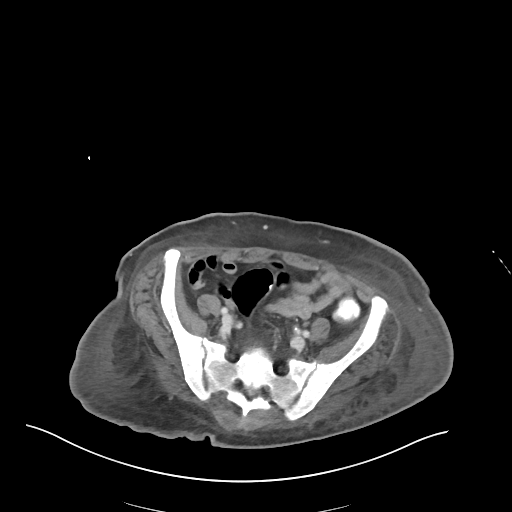
[im 52/60  soft-tissue]
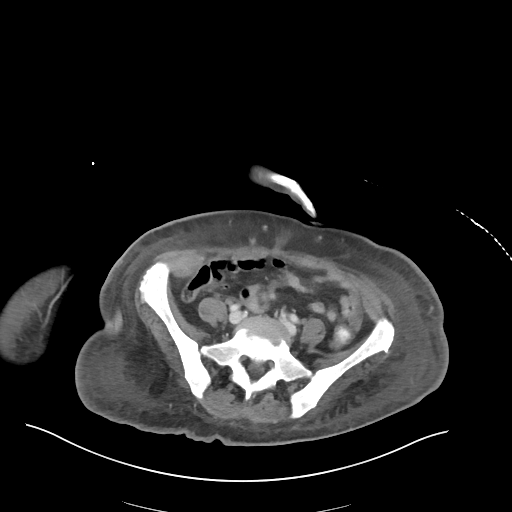
[im 56/60  soft-tissue]
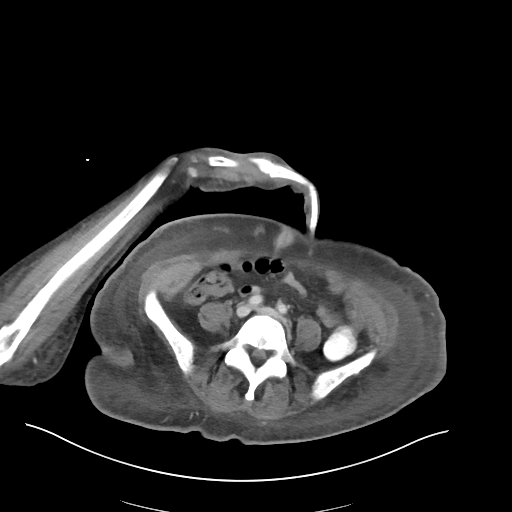

[Series 7: pelvis with 2.0 cor · coronal · 0.64mm/px · 3 of 120 slices shown]
[im 40/120  soft-tissue]
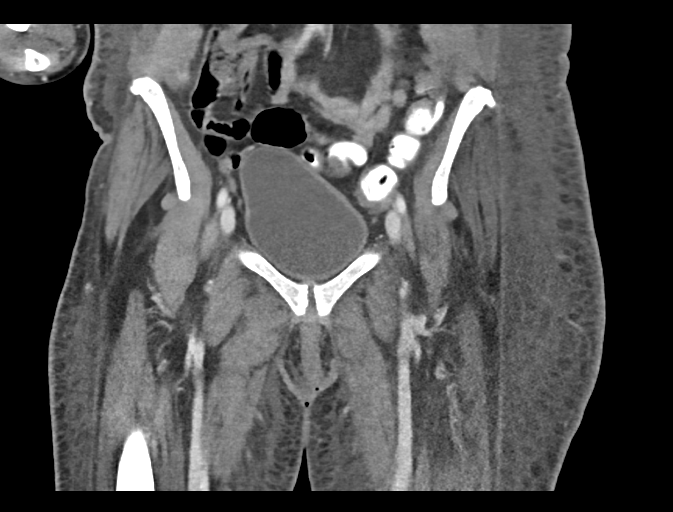
[im 53/120  soft-tissue]
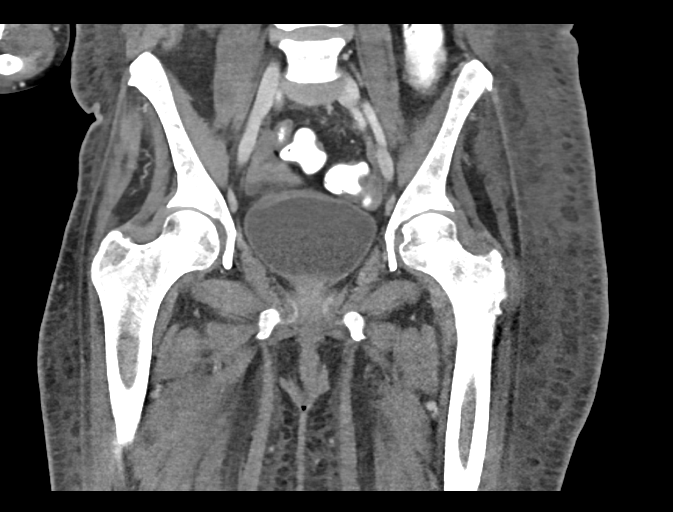
[im 67/120  soft-tissue]
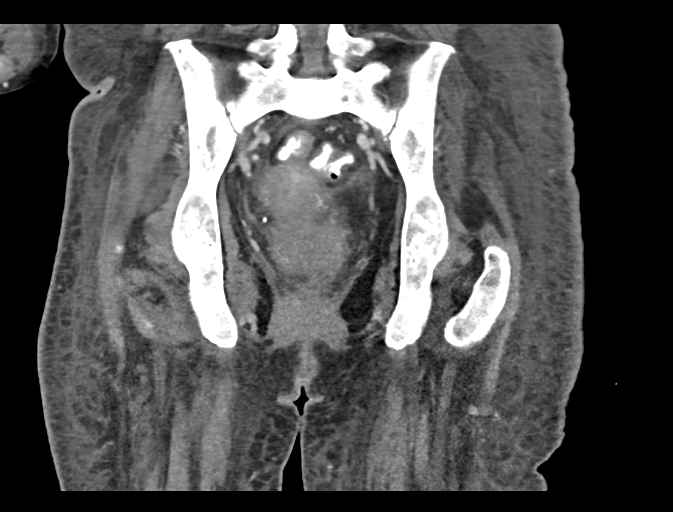

[16 of 46 positions shown; findings below may reference images not displayed]

FINDINGS: Urinary Tract:  No abnormality visualized.

Bowel: Unremarkable visualized pelvic bowel loops. No evidence of
perirectal/perianal abscess.

Vascular/Lymphatic: No pathologically enlarged lymph nodes. No
significant vascular abnormality seen.

Reproductive:  Uterus and adnexa unremarkable.  No mass.

Other: No free fluid or free air. Diffuse edema throughout the
subcutaneous soft tissues of the pelvis and upper thighs compatible
with anasarca.

Musculoskeletal: No visible sacral decubitus ulcer noted. No soft
tissue abscess or acute bony abnormality. No evidence of
osteomyelitis.
IMPRESSION: No visible abscess.

Anasarca like edema throughout the subcutaneous soft tissues.

## 2021-11-01 IMAGING — CT CT HEAD W/O CM
4 of 5 series · 15 of 47 positions shown, 17 images · non-contrast
Comparison: 05/25/2021 CT head, correlation is also made with
05/27/2021 MRI brain

CLINICAL DATA: Mental status change

EXAM:
CT HEAD WITHOUT CONTRAST
TECHNIQUE: Contiguous axial images were obtained from the base of the skull
through the vertex without intravenous contrast.

[Series 3: head without · axial · non-contrast · 0.44mm/px · z∈[+602,+702]mm · 5 of 32 slices shown, 7 images]
[im 6/32  brain]
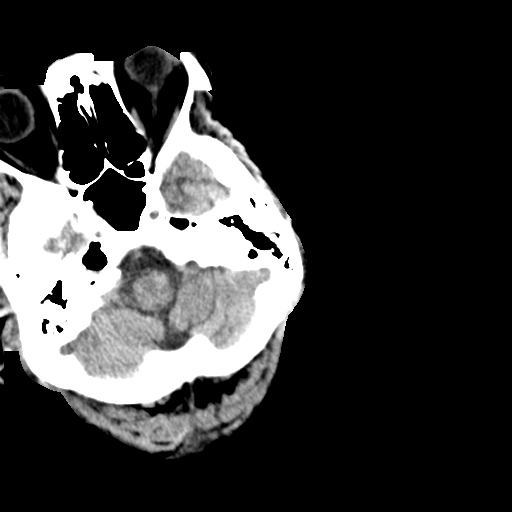
[im 6/32  bone]
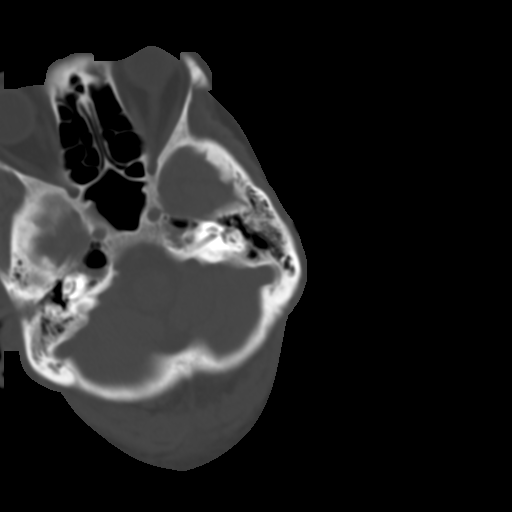
[im 11/32  brain]
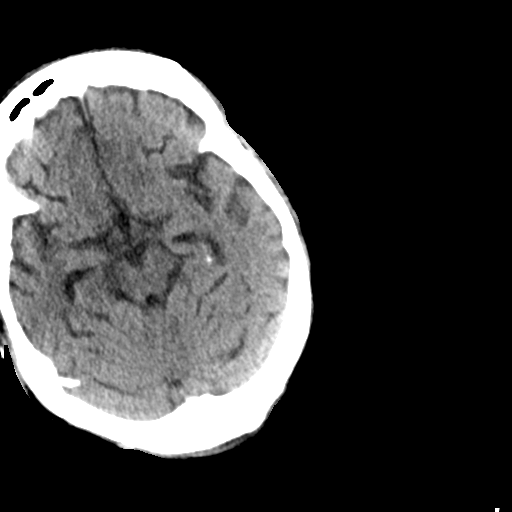
[im 16/32  brain]
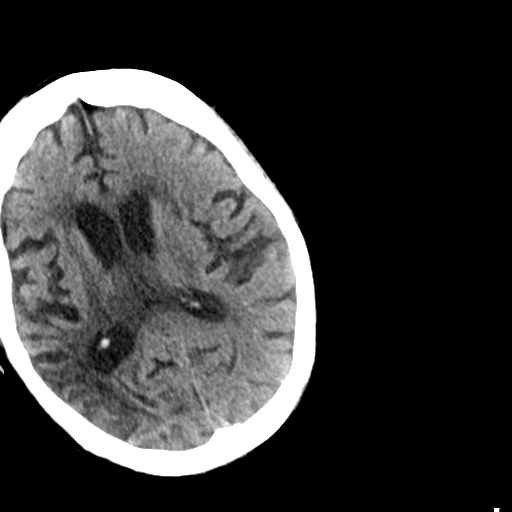
[im 21/32  brain]
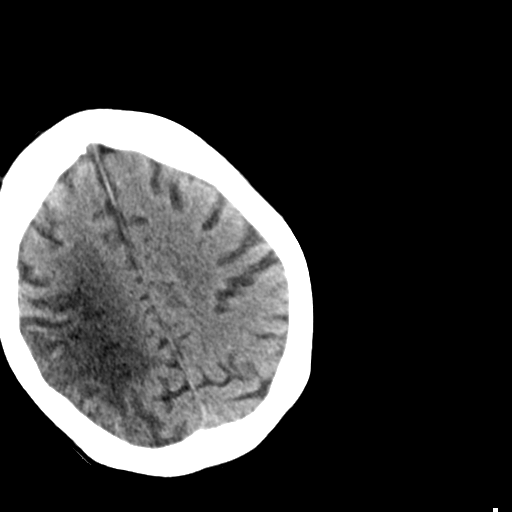
[im 26/32  brain]
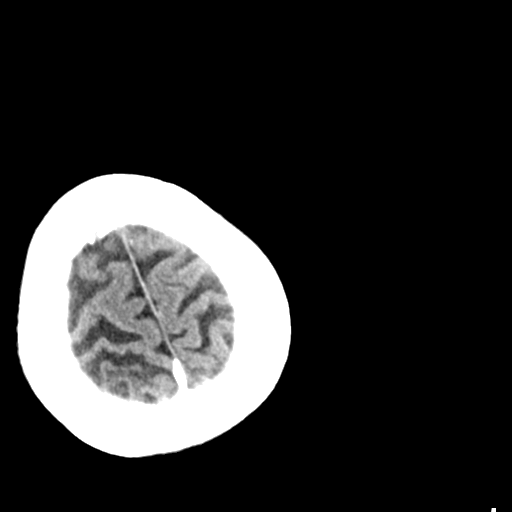
[im 26/32  bone]
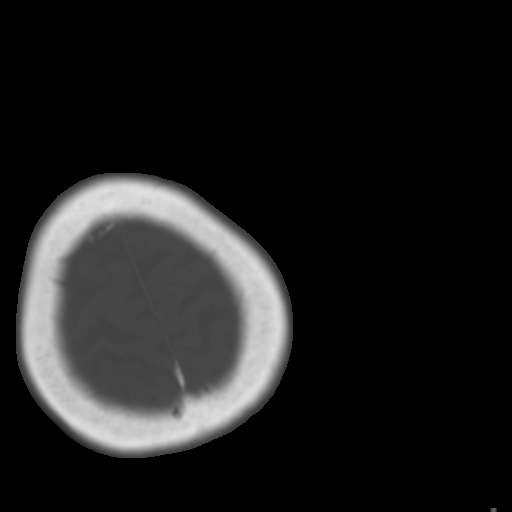

[Series 4: head without ax · axial · non-contrast · 0.44mm/px · z∈[+598,+671]mm · 4 of 32 slices shown]
[im 6/32  brain]
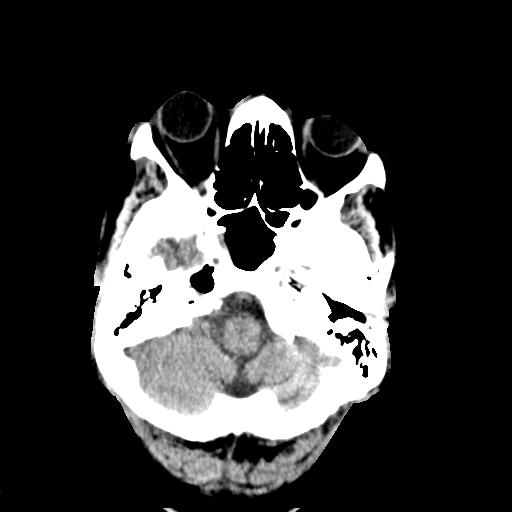
[im 11/32  brain]
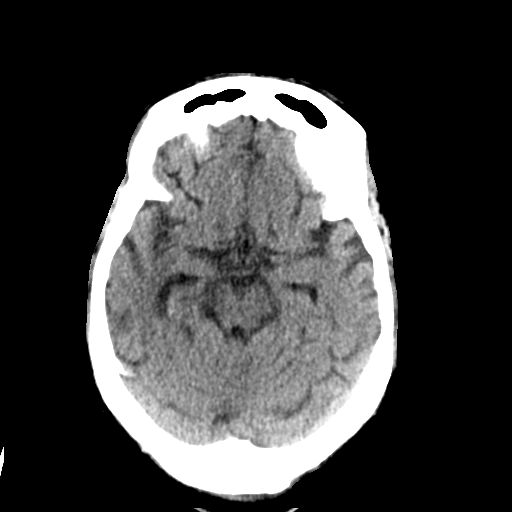
[im 16/32  brain]
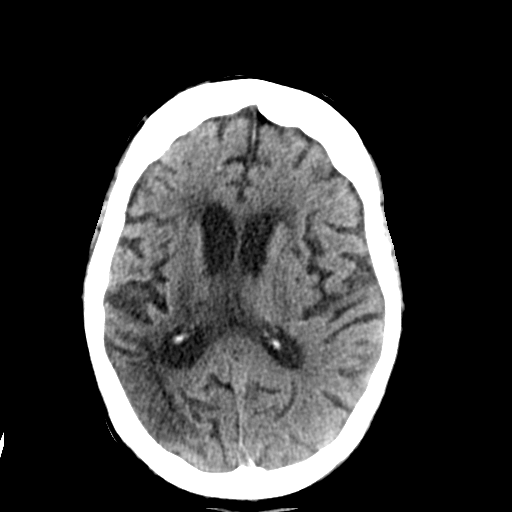
[im 21/32  brain]
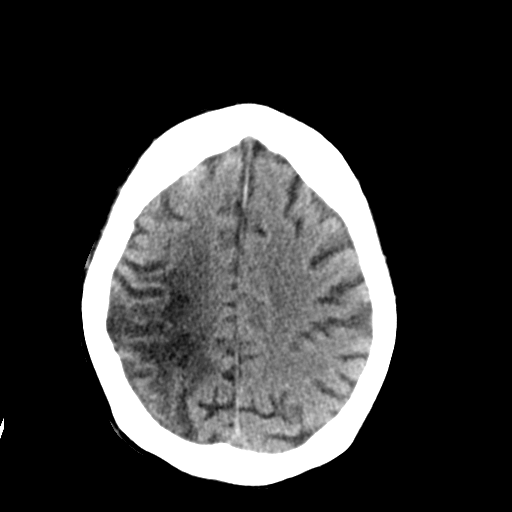

[Series 6: head without cor · coronal · non-contrast · 0.33mm/px · 3 of 66 slices shown]
[im 22/66  brain]
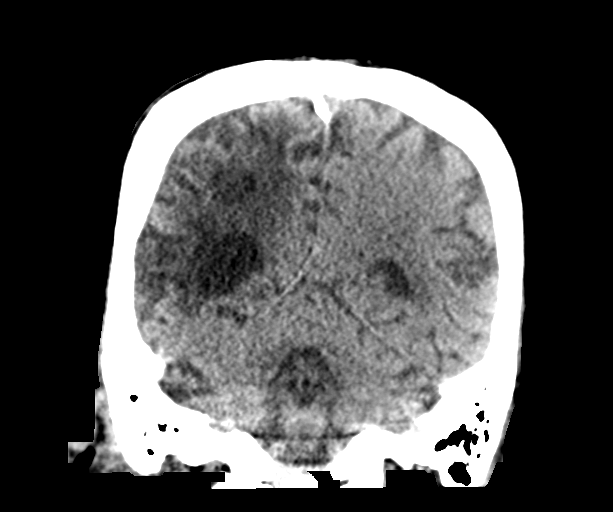
[im 29/66  brain]
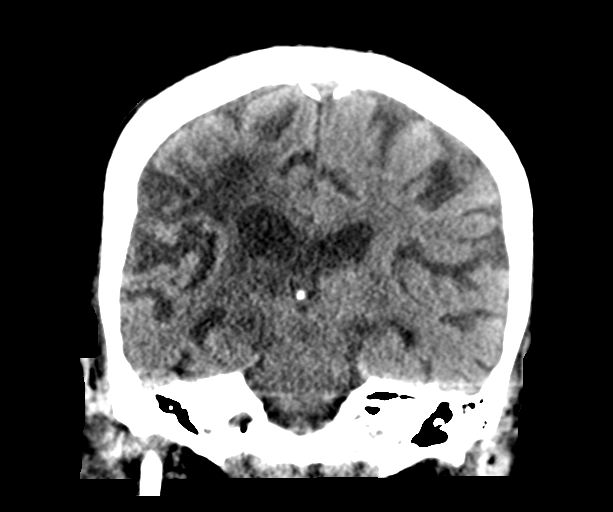
[im 37/66  brain]
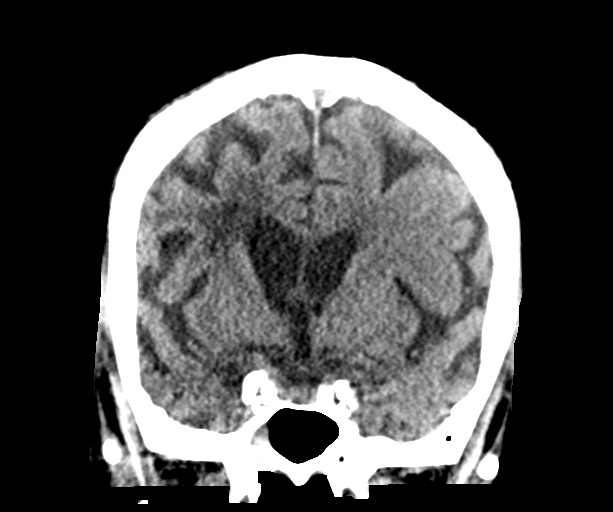

[Series 7: head without sag · sagittal · non-contrast · 0.33mm/px · 3 of 52 slices shown]
[im 18/52  brain]
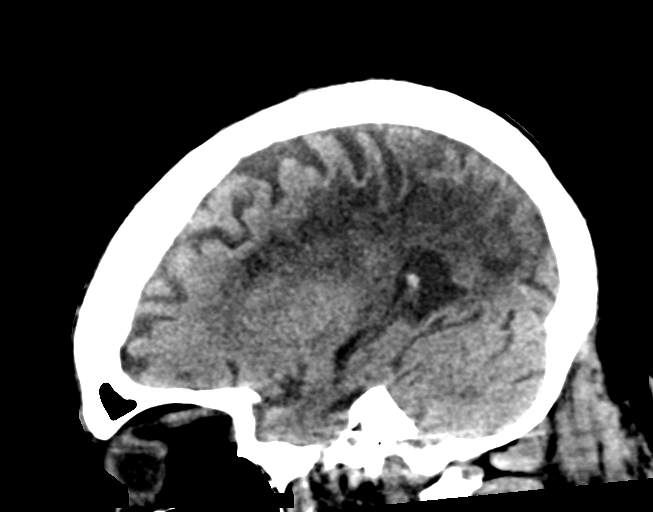
[im 26/52  brain]
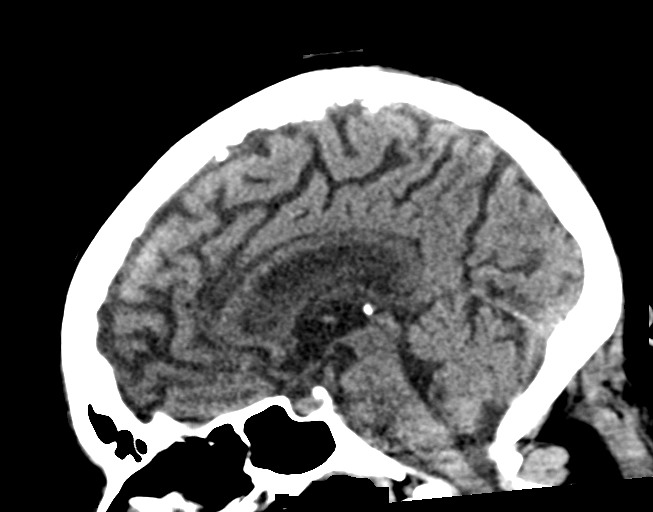
[im 35/52  brain]
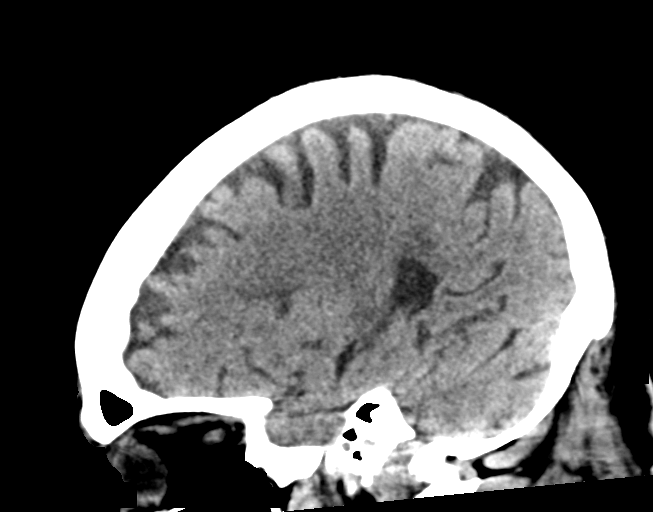

[15 of 47 positions shown; findings below may reference images not displayed]

FINDINGS: Brain: Area of increased hypodensity in the right posterior frontal
and anterior parietal lobe (series 4, image 13), which may reflect
sequela of prior infarct but could reflect acute or subacute
infarct. Additional areas of increased hypodensity in the right
thalamus and frontal, parietal, posterior temporal, and lateral
occipital lobes are likely the expected evolution of infarct noted
on 366166 and 05/27/2021. Periventricular white matter changes,
likely the sequela of chronic small vessel ischemic disease. No
acute hemorrhage, hydrocephalus, extra-axial collection, mass, mass
effect, or midline shift.

Vascular: No hyperdense vessel or unexpected calcification.

Skull: Normal. Negative for fracture or focal lesion.

Sinuses/Orbits: Mucosal thickening in the right sphenoid sinus.
Status post bilateral lens replacements.

Other: Fluid in the right-greater-than-left mastoid air cells.
IMPRESSION: 1. Area of hypodensity in the right posterior frontal and anterior
parietal lobe, which does not specifically correspond to an area of
infarct on the prior exams and may reflect additional acute or
subacute infarct. An MRI is recommended for further evaluation.
2. Additional areas of increased hypodensity in the right cerebral
hemisphere in areas of prior infarct from 05/25/2021, likely
expected evolution of prior infarct. No acute hemorrhage.

These results were called by telephone at the time of interpretation
on 08/04/2021 at [DATE] to provider DARRIEL NIU , who verbally
acknowledged these results.
